# Patient Record
Sex: Female | Born: 1935 | Race: White | Hispanic: No | Smoking: Never smoker
Health system: Southern US, Community
[De-identification: ages and names within clinical notes are randomized; demographics above are authoritative.]

## PROBLEM LIST (undated history)

## (undated) DIAGNOSIS — I10 Essential (primary) hypertension: Secondary | ICD-10-CM

## (undated) DIAGNOSIS — F329 Major depressive disorder, single episode, unspecified: Secondary | ICD-10-CM

## (undated) DIAGNOSIS — E785 Hyperlipidemia, unspecified: Secondary | ICD-10-CM

## (undated) DIAGNOSIS — Z8719 Personal history of other diseases of the digestive system: Secondary | ICD-10-CM

## (undated) DIAGNOSIS — J45909 Unspecified asthma, uncomplicated: Secondary | ICD-10-CM

## (undated) DIAGNOSIS — Z923 Personal history of irradiation: Secondary | ICD-10-CM

## (undated) DIAGNOSIS — K08109 Complete loss of teeth, unspecified cause, unspecified class: Secondary | ICD-10-CM

## (undated) DIAGNOSIS — Z972 Presence of dental prosthetic device (complete) (partial): Secondary | ICD-10-CM

## (undated) DIAGNOSIS — K219 Gastro-esophageal reflux disease without esophagitis: Secondary | ICD-10-CM

## (undated) DIAGNOSIS — Z87442 Personal history of urinary calculi: Secondary | ICD-10-CM

## (undated) DIAGNOSIS — R3915 Urgency of urination: Secondary | ICD-10-CM

## (undated) DIAGNOSIS — R1310 Dysphagia, unspecified: Secondary | ICD-10-CM

## (undated) DIAGNOSIS — J40 Bronchitis, not specified as acute or chronic: Secondary | ICD-10-CM

## (undated) DIAGNOSIS — I350 Nonrheumatic aortic (valve) stenosis: Secondary | ICD-10-CM

## (undated) DIAGNOSIS — G4700 Insomnia, unspecified: Secondary | ICD-10-CM

## (undated) DIAGNOSIS — C50919 Malignant neoplasm of unspecified site of unspecified female breast: Secondary | ICD-10-CM

## (undated) DIAGNOSIS — F32A Depression, unspecified: Secondary | ICD-10-CM

## (undated) DIAGNOSIS — M1990 Unspecified osteoarthritis, unspecified site: Secondary | ICD-10-CM

## (undated) DIAGNOSIS — K8020 Calculus of gallbladder without cholecystitis without obstruction: Secondary | ICD-10-CM

## (undated) DIAGNOSIS — D649 Anemia, unspecified: Secondary | ICD-10-CM

## (undated) HISTORY — DX: Gastro-esophageal reflux disease without esophagitis: K21.9

## (undated) HISTORY — DX: Personal history of irradiation: Z92.3

## (undated) HISTORY — PX: DILATION AND CURETTAGE OF UTERUS: SHX78

## (undated) HISTORY — PX: TUBAL LIGATION: SHX77

## (undated) HISTORY — DX: Malignant neoplasm of unspecified site of unspecified female breast: C50.919

## (undated) HISTORY — PX: OTHER SURGICAL HISTORY: SHX169

## (undated) HISTORY — DX: Unspecified osteoarthritis, unspecified site: M19.90

## (undated) HISTORY — PX: APPENDECTOMY: SHX54

## (undated) HISTORY — DX: Hyperlipidemia, unspecified: E78.5

## (undated) HISTORY — PX: ESOPHAGOGASTRODUODENOSCOPY: SHX1529

## (undated) HISTORY — DX: Essential (primary) hypertension: I10

## (undated) HISTORY — PX: EXPLORATORY LAPAROTOMY: SUR591

---

## 1996-07-06 HISTORY — PX: BREAST BIOPSY: SHX20

## 1999-06-20 ENCOUNTER — Other Ambulatory Visit: Admission: RE | Admit: 1999-06-20 | Discharge: 1999-06-20 | Payer: Self-pay | Admitting: *Deleted

## 2006-09-29 ENCOUNTER — Emergency Department (HOSPITAL_COMMUNITY): Admission: EM | Admit: 2006-09-29 | Discharge: 2006-09-29 | Payer: Self-pay | Admitting: Emergency Medicine

## 2009-07-06 HISTORY — PX: TOTAL SHOULDER REPLACEMENT: SUR1217

## 2009-09-26 ENCOUNTER — Emergency Department (HOSPITAL_COMMUNITY)
Admission: EM | Admit: 2009-09-26 | Discharge: 2009-09-26 | Payer: Self-pay | Source: Home / Self Care | Admitting: Emergency Medicine

## 2009-09-30 ENCOUNTER — Ambulatory Visit: Payer: Self-pay | Admitting: Interventional Radiology

## 2009-09-30 ENCOUNTER — Emergency Department (HOSPITAL_BASED_OUTPATIENT_CLINIC_OR_DEPARTMENT_OTHER): Admission: EM | Admit: 2009-09-30 | Discharge: 2009-09-30 | Payer: Self-pay | Admitting: Emergency Medicine

## 2009-10-02 ENCOUNTER — Ambulatory Visit: Payer: Self-pay | Admitting: Diagnostic Radiology

## 2009-10-02 ENCOUNTER — Ambulatory Visit (HOSPITAL_BASED_OUTPATIENT_CLINIC_OR_DEPARTMENT_OTHER): Admission: RE | Admit: 2009-10-02 | Discharge: 2009-10-02 | Payer: Self-pay | Admitting: Orthopedic Surgery

## 2009-10-05 ENCOUNTER — Emergency Department (HOSPITAL_BASED_OUTPATIENT_CLINIC_OR_DEPARTMENT_OTHER): Admission: EM | Admit: 2009-10-05 | Discharge: 2009-10-05 | Payer: Self-pay | Admitting: Emergency Medicine

## 2009-10-05 ENCOUNTER — Ambulatory Visit: Payer: Self-pay | Admitting: Radiology

## 2009-10-18 ENCOUNTER — Ambulatory Visit (HOSPITAL_COMMUNITY): Admission: RE | Admit: 2009-10-18 | Discharge: 2009-10-19 | Payer: Self-pay | Admitting: Orthopedic Surgery

## 2010-07-27 ENCOUNTER — Encounter: Payer: Self-pay | Admitting: Orthopedic Surgery

## 2010-09-23 LAB — CBC
HCT: 29.5 % — ABNORMAL LOW (ref 36.0–46.0)
Hemoglobin: 10.3 g/dL — ABNORMAL LOW (ref 12.0–15.0)
Platelets: 208 10*3/uL (ref 150–400)
RBC: 3.38 MIL/uL — ABNORMAL LOW (ref 3.87–5.11)
WBC: 12.2 10*3/uL — ABNORMAL HIGH (ref 4.0–10.5)

## 2010-09-23 LAB — BASIC METABOLIC PANEL
BUN: 14 mg/dL (ref 6–23)
CO2: 23 mEq/L (ref 19–32)
Chloride: 101 mEq/L (ref 96–112)
GFR calc non Af Amer: >60 mL/min (ref >60)
Glucose, Bld: 128 mg/dL — ABNORMAL HIGH (ref 70–99)
Sodium: 134 mEq/L — ABNORMAL LOW (ref 135–145)

## 2010-09-23 LAB — CROSSMATCH

## 2010-09-24 LAB — DIFFERENTIAL
Eosinophils Absolute: 0.2 10*3/uL (ref 0.0–0.7)
Lymphs Abs: 2.7 10*3/uL (ref 0.7–4.0)
Monocytes Absolute: 0.7 10*3/uL (ref 0.1–1.0)
Monocytes Relative: 6 % (ref 3–12)
Neutro Abs: 7.6 10*3/uL (ref 1.7–7.7)
Neutrophils Relative %: 67 % (ref 43–77)

## 2010-09-24 LAB — URINALYSIS, ROUTINE W REFLEX MICROSCOPIC
Ketones, ur: NEGATIVE mg/dL
Nitrite: NEGATIVE
Protein, ur: NEGATIVE mg/dL
pH: 7 (ref 5.0–8.0)

## 2010-09-24 LAB — CBC
HCT: 39.8 % (ref 36.0–46.0)
RBC: 4.55 MIL/uL (ref 3.87–5.11)
RDW: 14.1 % (ref 11.5–15.5)

## 2010-09-24 LAB — COMPREHENSIVE METABOLIC PANEL
AST: 41 U/L — ABNORMAL HIGH (ref 0–37)
CO2: 26 mEq/L (ref 19–32)
Creatinine, Ser: 0.62 mg/dL (ref 0.4–1.2)
GFR calc Af Amer: >60 mL/min (ref >60)
GFR calc non Af Amer: >60 mL/min (ref >60)
Glucose, Bld: 122 mg/dL — ABNORMAL HIGH (ref 70–99)
Total Bilirubin: 0.6 mg/dL (ref 0.3–1.2)
Total Protein: 7.3 g/dL (ref 6.0–8.3)

## 2010-09-24 LAB — CROSSMATCH: ABO/RH(D): O POS

## 2010-09-24 LAB — URINE CULTURE

## 2010-09-24 LAB — PROTIME-INR: INR: 0.97 (ref 0.00–1.49)

## 2011-03-25 ENCOUNTER — Ambulatory Visit
Admission: RE | Admit: 2011-03-25 | Discharge: 2011-03-25 | Disposition: A | Discharge status: Home / Self Care | Payer: Medicare Other | Source: Ambulatory Visit | Attending: Family Medicine | Admitting: Family Medicine

## 2011-03-25 ENCOUNTER — Other Ambulatory Visit: Payer: Self-pay | Admitting: Family Medicine

## 2011-03-25 MED ORDER — IOHEXOL 300 MG/ML  SOLN
100.0000 mL | Freq: Once | INTRAMUSCULAR | Status: AC | PRN
  Administered 2011-03-25: 100 mL via INTRAVENOUS

## 2011-03-26 ENCOUNTER — Telehealth: Payer: Self-pay | Admitting: Gastroenterology

## 2011-03-26 ENCOUNTER — Other Ambulatory Visit: Payer: Self-pay | Admitting: Oncology

## 2011-03-26 ENCOUNTER — Encounter (HOSPITAL_BASED_OUTPATIENT_CLINIC_OR_DEPARTMENT_OTHER): Payer: Medicare Other | Admitting: Oncology

## 2011-03-26 DIAGNOSIS — D649 Anemia, unspecified: Secondary | ICD-10-CM

## 2011-03-26 DIAGNOSIS — C8590 Non-Hodgkin lymphoma, unspecified, unspecified site: Secondary | ICD-10-CM

## 2011-03-26 DIAGNOSIS — C8583 Other specified types of non-Hodgkin lymphoma, intra-abdominal lymph nodes: Secondary | ICD-10-CM

## 2011-03-26 DIAGNOSIS — R1900 Intra-abdominal and pelvic swelling, mass and lump, unspecified site: Secondary | ICD-10-CM

## 2011-03-26 LAB — CBC WITH DIFFERENTIAL/PLATELET
Basophils Absolute: 0.1 10*3/uL (ref 0.0–0.1)
Eosinophils Absolute: 0.1 10*3/uL (ref 0.0–0.5)
MCHC: 33.5 g/dL (ref 31.5–36.0)
MONO#: 0.8 10*3/uL (ref 0.1–0.9)
NEUT#: 8.4 10*3/uL — ABNORMAL HIGH (ref 1.5–6.5)
NEUT%: 75.4 % (ref 38.4–76.8)
RBC: 3.94 10*6/uL (ref 3.70–5.45)
WBC: 11.2 10*3/uL — ABNORMAL HIGH (ref 3.9–10.3)
lymph#: 1.8 10*3/uL (ref 0.9–3.3)

## 2011-03-26 NOTE — Telephone Encounter (Signed)
PT HAS BEEN INSTRUCTED AND MEDS REVIEWED.  INSTRUCTIONS ALSO MAILED TO THE HER HOME

## 2011-03-26 NOTE — Telephone Encounter (Signed)
Dr. Dwain Sarna was contacted by Dr. Drue Second about this patient.  She has a large retroperitoneal mass that needs biopsy.  He got in touch with me to consider EUS (would be less morbid) to get tissue.  Patty, Can you get in touch with Dr. Feliz Beam office about sheduling her for upper EUS, radial +/- linear, next Tuesday afternoon (9/25 1:30 case?) at [facility].  Need H and P and medlist.  Let me know.  Dr. Dwain Sarna was going to talk with Dr. Welton Flakes to tell her he spoke with me and that we would be calling.  Thanks

## 2011-03-27 ENCOUNTER — Encounter: Payer: Medicare Other | Admitting: Oncology

## 2011-03-27 LAB — COMPREHENSIVE METABOLIC PANEL
AST: 25 U/L (ref 0–37)
BUN: 15 mg/dL (ref 6–23)
Calcium: 8.9 mg/dL (ref 8.4–10.5)
Chloride: 100 mEq/L (ref 96–112)
Creatinine, Ser: 0.69 mg/dL (ref 0.50–1.10)
Glucose, Bld: 140 mg/dL — ABNORMAL HIGH (ref 70–99)

## 2011-03-27 LAB — SEDIMENTATION RATE: Sed Rate: 109 mm/hr — ABNORMAL HIGH (ref 0–22)

## 2011-03-27 LAB — LACTATE DEHYDROGENASE: LDH: 208 U/L (ref 94–250)

## 2011-03-27 LAB — URIC ACID: Uric Acid, Serum: 4 mg/dL (ref 2.4–7.0)

## 2011-03-30 ENCOUNTER — Ambulatory Visit (INDEPENDENT_AMBULATORY_CARE_PROVIDER_SITE_OTHER): Payer: Self-pay | Admitting: General Surgery

## 2011-03-31 ENCOUNTER — Other Ambulatory Visit: Payer: Self-pay | Admitting: Gastroenterology

## 2011-03-31 ENCOUNTER — Encounter: Payer: Medicare Other | Admitting: Gastroenterology

## 2011-03-31 ENCOUNTER — Ambulatory Visit (HOSPITAL_COMMUNITY)
Admission: RE | Admit: 2011-03-31 | Discharge: 2011-03-31 | Disposition: A | Discharge status: Home / Self Care | Payer: Medicare Other | Source: Ambulatory Visit | Attending: Gastroenterology | Admitting: Gastroenterology

## 2011-03-31 DIAGNOSIS — R109 Unspecified abdominal pain: Secondary | ICD-10-CM | POA: Insufficient documentation

## 2011-03-31 DIAGNOSIS — R599 Enlarged lymph nodes, unspecified: Secondary | ICD-10-CM | POA: Insufficient documentation

## 2011-03-31 DIAGNOSIS — I2510 Atherosclerotic heart disease of native coronary artery without angina pectoris: Secondary | ICD-10-CM | POA: Insufficient documentation

## 2011-03-31 DIAGNOSIS — R933 Abnormal findings on diagnostic imaging of other parts of digestive tract: Secondary | ICD-10-CM

## 2011-03-31 DIAGNOSIS — R1909 Other intra-abdominal and pelvic swelling, mass and lump: Secondary | ICD-10-CM | POA: Insufficient documentation

## 2011-03-31 DIAGNOSIS — R5381 Other malaise: Secondary | ICD-10-CM | POA: Insufficient documentation

## 2011-03-31 DIAGNOSIS — R634 Abnormal weight loss: Secondary | ICD-10-CM | POA: Insufficient documentation

## 2011-03-31 DIAGNOSIS — R5383 Other fatigue: Secondary | ICD-10-CM | POA: Insufficient documentation

## 2011-04-01 ENCOUNTER — Encounter (HOSPITAL_COMMUNITY)
Admission: RE | Admit: 2011-04-01 | Discharge: 2011-04-01 | Disposition: A | Discharge status: Home / Self Care | Payer: Medicare Other | Source: Ambulatory Visit | Attending: Oncology | Admitting: Oncology

## 2011-04-01 ENCOUNTER — Ambulatory Visit (HOSPITAL_COMMUNITY)
Admission: RE | Admit: 2011-04-01 | Discharge: 2011-04-01 | Disposition: A | Discharge status: Home / Self Care | Payer: Medicare Other | Source: Ambulatory Visit | Attending: Oncology | Admitting: Oncology

## 2011-04-01 DIAGNOSIS — C8590 Non-Hodgkin lymphoma, unspecified, unspecified site: Secondary | ICD-10-CM

## 2011-04-01 DIAGNOSIS — C8589 Other specified types of non-Hodgkin lymphoma, extranodal and solid organ sites: Secondary | ICD-10-CM | POA: Insufficient documentation

## 2011-04-01 DIAGNOSIS — J984 Other disorders of lung: Secondary | ICD-10-CM | POA: Insufficient documentation

## 2011-04-01 DIAGNOSIS — M19019 Primary osteoarthritis, unspecified shoulder: Secondary | ICD-10-CM | POA: Insufficient documentation

## 2011-04-01 MED ORDER — IOHEXOL 300 MG/ML  SOLN
100.0000 mL | Freq: Once | INTRAMUSCULAR | Status: AC | PRN
  Administered 2011-04-01: 100 mL via INTRAVENOUS

## 2011-04-01 MED ORDER — FLUDEOXYGLUCOSE F - 18 (FDG) INJECTION
16.5000 | Freq: Once | INTRAVENOUS | Status: AC | PRN
  Administered 2011-04-01: 16.5 via INTRAVENOUS

## 2011-04-02 ENCOUNTER — Encounter (HOSPITAL_BASED_OUTPATIENT_CLINIC_OR_DEPARTMENT_OTHER): Payer: Medicare Other | Admitting: Oncology

## 2011-04-02 DIAGNOSIS — C8583 Other specified types of non-Hodgkin lymphoma, intra-abdominal lymph nodes: Secondary | ICD-10-CM

## 2011-04-03 ENCOUNTER — Telehealth (INDEPENDENT_AMBULATORY_CARE_PROVIDER_SITE_OTHER): Payer: Self-pay

## 2011-04-03 NOTE — Telephone Encounter (Signed)
Called pt to notify her that we want to see her in the office on Monday 04-06-11 @ 1:30./[facility]

## 2011-04-06 ENCOUNTER — Encounter (INDEPENDENT_AMBULATORY_CARE_PROVIDER_SITE_OTHER): Payer: Self-pay | Admitting: General Surgery

## 2011-04-06 ENCOUNTER — Ambulatory Visit (INDEPENDENT_AMBULATORY_CARE_PROVIDER_SITE_OTHER): Payer: Medicare Other | Admitting: General Surgery

## 2011-04-06 VITALS — BP 158/84 | HR 112 | Temp 97.5°F | Resp 16 | Ht <= 58 in | Wt 169.0 lb

## 2011-04-06 DIAGNOSIS — R1900 Intra-abdominal and pelvic swelling, mass and lump, unspecified site: Secondary | ICD-10-CM

## 2011-04-06 NOTE — Progress Notes (Signed)
Chief Complaint  Patient presents with  . Other    retroperitoneal mass    HPI Brandy [name] is a 77 y.o. female.   HPI This is a 77 year old female who presents for about a month of weight loss of about 35 pounds. During this time she has had no appetite and some constipation as well as some significant epigastric pain that is worsened. She has also had no night sweats, no fevers, no right rectal hernia blood in her stools at all. Due to the symptoms and the weight loss she underwent a CT scan of her abdomen which showed almost a 7 cm abnormal soft tissue attenuation with some fat stranding that is all appearing dorsal to the pancreatic head causing ventral displacement of the pancreas and this appears to be encasing her celiac, superior mesenteric arteries as well as some of the portal vein. This appears to be a lymphoma and not a primary pancreatic malignancy after reviewing the CT scan and discussing with the radiologist. A CT scan of her chest shows no real evidence of any disease at all. She also was noted to have a left breast nodule on one of these scans. PET scan showed a hypermetabolic soft tissue lesion at the posterior aspect of the pancreas around the celiac and superior mesenteric arteries that was present. There was no other abnormal activity in the abdomen or pelvis at all. She underwent an attempted biopsy with endoscopic ultrasound by Dr. Christella Hartigan and this was unsuccessful. She now comes in to discuss a surgical biopsy of this area. She is fairly deconditioned at this point and is not really hungry or taking in much orally now. There is nothing that is really relieving her pain at all at this point either.  Past Medical History  Diagnosis Date  . Arthritis   . Asthma   . GERD (gastroesophageal reflux disease)   . Hyperlipidemia   . Hypertension     Past Surgical History  Procedure Date  . Breast biopsy 1998    left  . Total shoulder replacement 2011    left    Family  History  Problem Relation Age of Onset  . Cancer Father     prostate    Social History History  Substance Use Topics  . Smoking status: Never Smoker   . Smokeless tobacco: Not on file  . Alcohol Use: No    No Known Allergies  Current Outpatient Prescriptions  Medication Sig Dispense Refill  . amLODipine (NORVASC) 10 MG tablet daily.      Marland Kitchen DIOVAN HCT 160-12.5 MG per tablet daily.      . simvastatin (ZOCOR) 20 MG tablet daily.      . traMADol (ULTRAM) 50 MG tablet Ad lib.        Review of Systems Review of Systems  Constitutional: Positive for appetite change, fatigue and unexpected weight change.  HENT: Negative.   Gastrointestinal: Positive for abdominal pain.  Neurological: Positive for weakness.  All other systems reviewed and are negative.    Blood pressure 158/84, pulse 112, temperature 97.5 F (36.4 C), temperature source Temporal, resp. rate 16, height 4\' 10"  (1.473 m), weight 169 lb (76.658 kg).  Physical Exam Physical Exam  Constitutional: She appears well-developed and well-nourished.  Eyes: No scleral icterus.  Neck: Neck supple. No thyromegaly present.  Cardiovascular: Normal rate, regular rhythm and normal heart sounds.   Pulmonary/Chest: Effort normal and breath sounds normal. She has no wheezes. She has no rales.  Abdominal: Soft.  Bowel sounds are normal. She exhibits no mass. There is tenderness (epigastric tenderness). There is no rebound and no guarding.  Lymphadenopathy:    She has no cervical adenopathy.    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 That leaves [place] with an intra-abdominal mass associated very closely with her pancreas and her upper normal vasculature without a diagnosis. She  certainly sounds as she has a malignancy and has what this appears to be. I don't think this is a primary pancreatic malignancy in his looks to be a lymphoma. I will plan on checking a CA 19-9 on her. I discussed with her laparoscopic but a possible open biopsy of this area. I discussed with her I think that if this is the only area of it's abnormal she likely will get an open biopsy due its proximity to her major vasculature in her abdomen just for safety. We discussed the risks being but not limited to bleeding, blood transfusion, infection, difficulty healing given her preoperative state, DVT, PE, and injury to other vascular structure or bowel requiring repair. I don't the we have another choice bu  to proceed to the operating room and encourage her to try to increase her nutrition prior to going to the operating room as well.       Brandy [name] 04/06/2011, 3:37 PM

## 2011-04-14 ENCOUNTER — Other Ambulatory Visit (INDEPENDENT_AMBULATORY_CARE_PROVIDER_SITE_OTHER): Payer: Self-pay | Admitting: General Surgery

## 2011-04-14 ENCOUNTER — Encounter (HOSPITAL_COMMUNITY): Payer: Medicare Other

## 2011-04-14 LAB — CBC
Hemoglobin: 10.7 g/dL — ABNORMAL LOW (ref 12.0–15.0)
MCH: 27 pg (ref 26.0–34.0)
MCV: 86.1 fL (ref 78.0–100.0)
RBC: 3.97 MIL/uL (ref 3.87–5.11)

## 2011-04-14 LAB — PROTIME-INR: INR: 1.05 (ref 0.00–1.49)

## 2011-04-14 LAB — CROSSMATCH

## 2011-04-14 LAB — DIFFERENTIAL
Eosinophils Absolute: 0.1 10*3/uL (ref 0.0–0.7)
Eosinophils Relative: 1 % (ref 0–5)
Lymphs Abs: 2 10*3/uL (ref 0.7–4.0)
Monocytes Relative: 10 % (ref 3–12)

## 2011-04-14 LAB — COMPREHENSIVE METABOLIC PANEL
ALT: 26 U/L (ref 0–35)
Albumin: 3.2 g/dL — ABNORMAL LOW (ref 3.5–5.2)
Alkaline Phosphatase: 95 U/L (ref 39–117)
BUN: 16 mg/dL (ref 6–23)
Chloride: 100 mEq/L (ref 96–112)
Glucose, Bld: 125 mg/dL — ABNORMAL HIGH (ref 70–99)
Potassium: 3.8 mEq/L (ref 3.5–5.1)
Sodium: 139 mEq/L (ref 135–145)
Total Bilirubin: 0.2 mg/dL — ABNORMAL LOW (ref 0.3–1.2)

## 2011-04-14 LAB — SURGICAL PCR SCREEN: MRSA, PCR: NEGATIVE

## 2011-04-16 ENCOUNTER — Other Ambulatory Visit (INDEPENDENT_AMBULATORY_CARE_PROVIDER_SITE_OTHER): Payer: Self-pay | Admitting: General Surgery

## 2011-04-16 ENCOUNTER — Inpatient Hospital Stay (HOSPITAL_COMMUNITY)
Admission: RE | Admit: 2011-04-16 | Discharge: 2011-04-21 | DRG: 358 | Disposition: A | Discharge status: Home / Self Care | Payer: Medicare Other | Source: Ambulatory Visit | Attending: General Surgery | Admitting: General Surgery

## 2011-04-16 DIAGNOSIS — I10 Essential (primary) hypertension: Secondary | ICD-10-CM | POA: Diagnosis present

## 2011-04-16 DIAGNOSIS — D200 Benign neoplasm of soft tissue of retroperitoneum: Secondary | ICD-10-CM

## 2011-04-16 DIAGNOSIS — K219 Gastro-esophageal reflux disease without esophagitis: Secondary | ICD-10-CM | POA: Diagnosis present

## 2011-04-16 DIAGNOSIS — Z01812 Encounter for preprocedural laboratory examination: Secondary | ICD-10-CM

## 2011-04-16 DIAGNOSIS — D201 Benign neoplasm of soft tissue of peritoneum: Secondary | ICD-10-CM

## 2011-04-16 DIAGNOSIS — R634 Abnormal weight loss: Secondary | ICD-10-CM | POA: Diagnosis present

## 2011-04-16 DIAGNOSIS — Z01810 Encounter for preprocedural cardiovascular examination: Secondary | ICD-10-CM

## 2011-04-16 DIAGNOSIS — K869 Disease of pancreas, unspecified: Secondary | ICD-10-CM | POA: Diagnosis present

## 2011-04-16 DIAGNOSIS — R1900 Intra-abdominal and pelvic swelling, mass and lump, unspecified site: Principal | ICD-10-CM | POA: Diagnosis present

## 2011-04-16 DIAGNOSIS — E785 Hyperlipidemia, unspecified: Secondary | ICD-10-CM | POA: Diagnosis present

## 2011-04-17 LAB — BASIC METABOLIC PANEL
CO2: 24 mEq/L (ref 19–32)
Calcium: 8.6 mg/dL (ref 8.4–10.5)
Chloride: 102 mEq/L (ref 96–112)
GFR calc Af Amer: 64 mL/min — ABNORMAL LOW (ref >90)
Glucose, Bld: 157 mg/dL — ABNORMAL HIGH (ref 70–99)
Potassium: 5.2 mEq/L — ABNORMAL HIGH (ref 3.5–5.1)
Sodium: 135 mEq/L (ref 135–145)

## 2011-04-17 LAB — CBC
Hemoglobin: 10.4 g/dL — ABNORMAL LOW (ref 12.0–15.0)
MCH: 26.9 pg (ref 26.0–34.0)
Platelets: 346 10*3/uL (ref 150–400)
RBC: 3.87 MIL/uL (ref 3.87–5.11)
WBC: 17.1 10*3/uL — ABNORMAL HIGH (ref 4.0–10.5)

## 2011-04-18 LAB — BASIC METABOLIC PANEL
BUN: 18 mg/dL (ref 6–23)
Calcium: 8.3 mg/dL — ABNORMAL LOW (ref 8.4–10.5)
Creatinine, Ser: 0.73 mg/dL (ref 0.50–1.10)
GFR calc Af Amer: >90 mL/min (ref >90)
GFR calc non Af Amer: 81 mL/min — ABNORMAL LOW (ref >90)

## 2011-04-19 LAB — BASIC METABOLIC PANEL
CO2: 26 mEq/L (ref 19–32)
Calcium: 8.4 mg/dL (ref 8.4–10.5)
Creatinine, Ser: 0.49 mg/dL — ABNORMAL LOW (ref 0.50–1.10)
GFR calc Af Amer: >90 mL/min (ref >90)
GFR calc non Af Amer: >90 mL/min (ref >90)

## 2011-04-19 NOTE — Op Note (Signed)
NAMEMYAN, LOCATELLI NO.:  [id number]  MEDICAL RECORD NO.:  [id number]  LOCATION:  1531                         FACILITY:  [facility]  PHYSICIAN:  Juanetta Gosling, MDDATE OF BIRTH:  [birth year]  DATE OF PROCEDURE:  04/16/2011 DATE OF DISCHARGE:                              OPERATIVE REPORT   PREOPERATIVE DIAGNOSIS:  Retroperitoneal mass.  POSTOPERATIVE DIAGNOSIS:  Retroperitoneal mass.  PROCEDURE: 1. Diagnostic laparoscopy. 2. Exploratory laparotomy with biopsy and multiple FNAs of     retroperitoneal mass in the pancreas.  SURGEON:  Troy Sine. Dwain Sarna, M.D.  ASSISTANT:  Mary Sella. Andrey Campanile, MD.  ANESTHESIA:  General.  SUPERVISOR/ANESTHESIOLOGIST:  Hezzie Bump. Rose, M.D.  SPECIMENS TO PATHOLOGY: 1. Multiple FNAs with cell blocks. 2. Omental nodule. 3. Pancreatic biopsy.  ESTIMATED BLOOD LOSS:  100 mL.  COMPLICATIONS:  None.  DRAINS:  19-French Blake overlying the pancreas.  DISPOSITION:  To recovery room in stable condition.  INDICATIONS:  This is a 77 year old female who had had 35-pound weight loss over the last month with no appetite, some constipation, and some significant epigastric pain.  Due to the symptoms and the weight loss, she underwent a CT scan of her abdomen that showed an almost 7 cm abnormal soft tissue attenuation with fat stranding that appears dorsal to the pancreatic head, causing ventral displacement, and is encasing her celiac, superior mesenteric as well as some of the portal vein. This appeared to be a lymphoma and the radiologist thought, after reviewing the CT scan, this is not a primary pancreatic malignancy.  PET scan shows a hypermetabolic soft tissue lesion at the posterior aspect of the pancreas around the celiac and superior mesenteric arteries. There is no other abnormal activity.  I discussed this with the Intervention Radiology, who did not feel this is amenable to biopsy. She also wanted an endoscopic  ultrasound by Dr. Christella Hartigan after I referred her to him to undergo a biopsy, and this was benign as well and he did feel like he got into the area.  She and I then discussed an open biopsy.  I discussed laparoscopy with a possible open biopsy.  PROCEDURE IN DETAIL:  After informed consent was obtained, the patient was taken to the operating room.  She was administered 1 g of intravenous cefazolin.  Sequential compression devices were placed on the lower extremities prior to induction of the anesthesia.  She was then placed under general endotracheal anesthesia without complication. Nasogastric tube and a Foley catheter were placed.  Her abdomen was then prepped and draped in the standard sterile surgical fashion.  Surgical time-out was then performed.  I accessed, I made a 5 mm incision in the left upper quadrant.  After her stomach was evacuated, I inserted the laparoscope in Optiview technique.  After a thorough exploration of her abdomen, I did not identify any disease that was amenable to a biopsy.  I then removed the laparoscope and made an upper midline incision.  I inserted the Bookwalter retractor.  There was no evidence of any hepatic disease, peritoneal disease, or any other disease in her spleen, large bowel, or small bowel.  What she had was a very large nondiscrete mass  near the head of her pancreas, duodenum, and in her retroperitoneum.  I opened her lesser sac and took the transverse colon down completely viewing the entire pancreas.  The entire pancreas felt hard and nodular.  I also took down some of the lesser curvature, as I felt like this was a mass underneath this as well.  There was not really an area that was amenable to a biopsy completely in either one of these areas at first glance.  We actually attempted to kocherize the duodenum as well.  The mass was very adherent to the retroperitoneum as we began to kocherize the duodenum and it was stuck and there was no  real tissue plane to continue so I had stopped kocherizing the duodenum as I think that this would have caused some significant problems if I was going to continue this area.  During this time, I found an omental nodule that I removed and sent, and this appears to be benign according to the pathologist.  Eventually, what I had decided on doing was that there was not really any other way to get this area, I did took a 15 blade and took a portion of the very hardened pancreas where this felt like it was the mass and excised this.  This, on frozen, appeared to be benign and did not have a malignancy associated with it, we will have to wait for the final results.  I also did remove the omental nodule, which ended up being benign on frozen.  I also at that point, did not really feel like there was another area I could biopsy or put a core biopsy needle in, so what I did was I did several FNAs of the mass and the head of the pancreas.  There were epithelial cells on one of these FNAs and there was some tissue to do a cell block afterwards.  I discussed this with a couple of my partners as well and had another one of my partners outside, Dr. Andrey Campanile, scrub in to see if he would do anything else further at this point and he was agreeable that it would not continue at this point either.  I think even with image-directed biopsies, I would not be comfortable putting a needle any further in any of these areas or certainly trying to mobilize these areas, I think that it would cause more damage and I would put her risk of either injuring a major abdominal blood vessel or her common duct or putting a hole in her duodenum.  I elected at this point, as I had some tissue for biopsies as well as the FNAs to stop.  I then placed a 19-French Blake drain overlying of her pancreas where I had done the biopsies.  I then irrigated copiously.  I then closed her with  #1 PDS and interrupted #1 Novafil.  I then  stapled her skin.  I placed a silver dressing overlying this.  She tolerated this well, was extubated and transferred to the recovery room in stable condition.     Juanetta Gosling, MD     MCW/MEDQ  D:  04/16/2011  T:  04/17/2011  Job:  [id number]  cc:   Drue Second, M.D.  Rachael Fee, MD [address] [place], [place] [address]  Sigmund Hazel, M.D. Fax: [phone]  Electronically Signed by Emelia Loron MD on 04/19/2011 07:11:46 PM

## 2011-04-20 ENCOUNTER — Ambulatory Visit (INDEPENDENT_AMBULATORY_CARE_PROVIDER_SITE_OTHER): Payer: Self-pay | Admitting: General Surgery

## 2011-04-20 LAB — BASIC METABOLIC PANEL
Calcium: 8.6 mg/dL (ref 8.4–10.5)
Glucose, Bld: 124 mg/dL — ABNORMAL HIGH (ref 70–99)
Sodium: 138 mEq/L (ref 135–145)

## 2011-04-21 LAB — BASIC METABOLIC PANEL
BUN: 6 mg/dL (ref 6–23)
Potassium: 3.4 mEq/L — ABNORMAL LOW (ref 3.5–5.1)

## 2011-04-23 ENCOUNTER — Telehealth: Payer: Self-pay

## 2011-04-23 DIAGNOSIS — K8689 Other specified diseases of pancreas: Secondary | ICD-10-CM

## 2011-04-23 NOTE — Telephone Encounter (Signed)
Message copied by Donata Duff on Thu Apr 23, 2011 10:21 AM ------      Message from: Rob Bunting P      Created: Thu Apr 23, 2011 10:01 AM       Casimira Sutphin,       She needs repeat upper EUS, linear only, ++propofol, for pancreatic mass, next available EUS time at [facility]            thanks                  ----- Message -----         From: Emelia Loron, MD         Sent: 04/23/2011   9:41 AM           To: Rob Bunting, MD            I talked to her about it.  Maybe try in about two weeks.  If you would do that would be great. I really appreciate it.  I will talk to you sometime before this to let you know what I found.      Matt      ----- Message -----         From: Rob Bunting, MD         Sent: 04/23/2011   7:38 AM           To: Emelia Loron, MD            Yes, I'll give it another try.  Should I go ahead and get in touch with her to book it?            dj            ----- Message -----         From: Emelia Loron, MD         Sent: 04/23/2011   7:27 AM           To: Rob Bunting, MD            Jesusita Oka      I tried to biopsy Lamoyne Kirchgessner open and all my biopsies were nondiagnostic.  This thing clinically is a pancreatic cancer even though 19-9 is normal.  We still need tissue.  Would you be willing in a couple of weeks to try again?      Matt

## 2011-04-24 NOTE — Telephone Encounter (Signed)
Need to call pt for a Dec appt no Nov appt available

## 2011-04-29 ENCOUNTER — Telehealth: Payer: Self-pay

## 2011-04-29 ENCOUNTER — Other Ambulatory Visit: Payer: Self-pay | Admitting: Gastroenterology

## 2011-04-29 NOTE — Telephone Encounter (Addendum)
Pt scheduled for EUS needs instructions and meds reviewed. Left message on machine to call back

## 2011-04-30 ENCOUNTER — Encounter (INDEPENDENT_AMBULATORY_CARE_PROVIDER_SITE_OTHER): Payer: Self-pay | Admitting: General Surgery

## 2011-04-30 ENCOUNTER — Ambulatory Visit (INDEPENDENT_AMBULATORY_CARE_PROVIDER_SITE_OTHER): Payer: Medicare Other | Admitting: General Surgery

## 2011-04-30 ENCOUNTER — Telehealth (INDEPENDENT_AMBULATORY_CARE_PROVIDER_SITE_OTHER): Payer: Self-pay

## 2011-04-30 VITALS — BP 138/64 | HR 96 | Temp 97.6°F | Resp 18 | Ht 60.0 in | Wt 164.4 lb

## 2011-04-30 DIAGNOSIS — Z09 Encounter for follow-up examination after completed treatment for conditions other than malignant neoplasm: Secondary | ICD-10-CM

## 2011-04-30 NOTE — Telephone Encounter (Signed)
The daughter wants to talk to you about the pt that was seen today and going to see Dr Christella Hartigan. The daughter said she could call you and ask you any questions about the pt and you would call her back. Her work [phone] or [phone]/ [facility]

## 2011-04-30 NOTE — Progress Notes (Signed)
Subjective:     Patient ID: Brandy [name], female   DOB: 1932-12-20, 77 y.o.   MRN: [id number]  HPI Is a 77 year old female who I did a biopsy of an intra-abdominal mass opened not long ago. No disease was diagnostic. This was very difficult to do and appeared to clinically possibly be a pancreatic cancer. She comes in today doing fairly well after operation but no real significant complaints.  Review of Systems     Objective:   Physical Exam Healing incision without infection, minimal serous drainage around umbilicus    Assessment:     S/p e lap for mass    Plan:        I took her staples out today and applied Steri-Strips. I will see her back in 2 weeks. I will also discuss with Dr. Christella Hartigan in proceeding with another endoscopic ultrasound with biopsy. She was discussed at gastrointestinal multidisciplinary conference this was agreed upon by one as well.

## 2011-04-30 NOTE — Telephone Encounter (Signed)
Pt aware meds reviwed and instructed instructions also mailed to the home

## 2011-05-03 NOTE — Discharge Summary (Signed)
Brandy [name], MURFIN NO.:  [id number]  MEDICAL RECORD NO.:  [id number]  LOCATION:  1531                         FACILITY:  [facility]  PHYSICIAN:  Juanetta Gosling, MDDATE OF BIRTH:  1933/04/17  DATE OF ADMISSION:  04/16/2011 DATE OF DISCHARGE:  04/21/2011                              DISCHARGE SUMMARY   ADMISSION DIAGNOSES: 1. Retroperitoneal mass. 2. Arthritis. 3. History of asthma. 4. Gastroesophageal reflux disease. 5. Hyperlipidemia. 6. Hypertension.  DISCHARGE DIAGNOSES: 1. Retroperitoneal mass. 2. Arthritis. 3. History of asthma. 4. Gastroesophageal reflux disease. 5. Hyperlipidemia. 6. Hypertension. 7. Ileus.  PROCEDURES PERFORMED:  On April 16, 2011 with a diagnostic laparoscopy with an exploratory laparotomy with biopsy, multiple FNAs of retroperitoneal mass that appeared to be emanating from the pancreas.  HISTORY AND CONSULTS:  None.  HISTORY AND HOSPITAL COURSE:  Brandy [name] is a 77 year old female who had about a month weight loss about 35 pounds.  She has had no appetite and had some epigastric pain.  She has had a CT scan that showed about 5 to 7 cm abnormal soft tissue attenuation with some fat stranding, all appearing dorsal to pancreatic head, causing a ventral displacement or pancreas that appeared to be a case her celiac, SMA, portal vein.  That appeared to be a lymphoma and I did not think was a primary pancreatic malignancy.  Her CA 99 is normal.  CT scan of the chest is negative. PET scan shows only hypermetabolic soft tissue lesion at the posterior aspect of the pancreas.  She underwent attempted biopsy with endoscopic ultrasound by Dr. Christella Hartigan which was unsuccessful, so we discussed an open biopsy as well as the risks and benefits associated with that procedure. She underwent a procedure which was exceedingly difficult, and need to refer to my operative note for the details on that.  This did appear to emanate from the  pancreas.  I took a numerous biopsies of this area. She after the operation was maintained on the floor, I discontinued the NG tube.  The following day, I left that in due to the fact that it mobilized her stomach fairly significantly.  She was maintained on Lovenox.  Her Foley was removed.  She began passing gas on postoperative day #2 and her diet was advanced.  Her oxygen was weaned off and by postoperative day #5, she had oral pain meds which she was doing well with, she was tolerating her diet, and she was discharged to home.  Her pathology ended up as being all benign or benign pancreatic tissue with a mixed inflammatory infiltrate with no evidence of a cancer that was present.  MEDICATIONS:  Upon discharge were: 1. Colace 100 mg b.i.d. 2. Percocet as needed. 3. Amlodipine 10 mg daily. 4. Prilosec 20 mg daily. 5. Simvastatin 20 mg daily. 6. Tramadol as needed. 7. Valsartan/hydrochlorothiazide daily.  PERTINENT LABORATORY EVALUATION:  Hematocrit of 33.5, her creatinine 0.97 and less than 0.47 upon discharge.  PERTINENT RADIOLOGIC INFORMATION:  None.  FOLLOWUP:  Dr. Dwain Sarna, [facility] in 2 to 3 weeks of discharge was per her [facility] instructions sheets that she was discharged to home.     Juanetta Gosling, MD  MCW/MEDQ  D:  05/02/2011  T:  05/02/2011  Job:  [id number]  Electronically Signed by Emelia Loron MD on 05/03/2011 11:91:47 PM

## 2011-05-07 NOTE — Telephone Encounter (Signed)
I haven't called her yet.

## 2011-05-18 ENCOUNTER — Ambulatory Visit (INDEPENDENT_AMBULATORY_CARE_PROVIDER_SITE_OTHER): Payer: Medicare Other | Admitting: General Surgery

## 2011-05-18 ENCOUNTER — Encounter (INDEPENDENT_AMBULATORY_CARE_PROVIDER_SITE_OTHER): Payer: Self-pay | Admitting: General Surgery

## 2011-05-18 VITALS — BP 132/70 | HR 76 | Temp 97.4°F | Resp 14 | Ht 59.75 in | Wt 158.4 lb

## 2011-05-18 DIAGNOSIS — Z09 Encounter for follow-up examination after completed treatment for conditions other than malignant neoplasm: Secondary | ICD-10-CM

## 2011-05-18 NOTE — Progress Notes (Signed)
Subjective:     Patient ID: Brandy [name], female   DOB: 08/03/1932, 78 y.o.   MRN: [id number]  HPI This is a 78-year-old female who went to the operating room on October 11 for a laparotomy for a biopsy for retroperitoneal mass. All of these biopsies and to be nondiagnostic. This clinically I thought was a cancer at the time of the surgery. Her CA 19-9 has been normal. She has recovered fairly well. She has a small open area in her wound which is healing and almost nearly gone now. She had a significant amount of heartburn preoperatively which is no longer present. She still has some abdominal pain from her surgery but is otherwise different from preoperatively. She reports she is eating okay and having several frequent smaller meals per day now. She is due to see Dr. Jacobs in 2 weeks.  Review of Systems     Objective:   Physical Exam Healing laparotomy incision without infection, 1mm open area I packed with iodoform today    Assessment:     S/p elap Retroperitoneal mass    Plan:     All of my prior biopsies have been nondiagnostic. I stopped the operation due to my concern for causing an injury in this region. I don't think there is anything else to do surgically to its proximity to the vessels and my inability to continue with Kocher maneuver. We still don't really have a diagnosis on this area. The plan is to redo an endoscopic ultrasound with the biopsy and see if this will gain access for a biopsy. If this is negative then we are going need to discuss further options which I think the best would be to proceed with a repeat CT scan in in follow up from there.      

## 2011-05-19 ENCOUNTER — Telehealth: Payer: Self-pay

## 2011-05-19 NOTE — Telephone Encounter (Signed)
Message copied by Donata Duff on Tue May 19, 2011  9:20 AM ------      Message from: Rachael Fee      Created: Tue May 19, 2011  7:10 AM       Emanii Bugbee,      Can you put her in for linear EUS, , for retroperitoneal mass.  Looks like I have time on Nov 29th at [facility] in AM (she does not need propofol).            Matt,      I think we can do this on 29th, we'll get in touch with her and I'll let you know what I find.                  ----- Message -----         From: Emelia Loron, MD         Sent: 05/18/2011   2:22 PM           To: Rob Bunting, MD            Jesusita Oka,      I saw Mrs. Chalk today.  She is fine from my standpoint to go ahead with EUS.  I think she is supposed to see you in December.  They are nervous (appropriately) and wanted to see if you could do sooner at all.      Thanks,      [employer]

## 2011-05-19 NOTE — Telephone Encounter (Signed)
Pt appt moved to 06/04/11 NO MAC she is aware and re instructed

## 2011-06-03 ENCOUNTER — Telehealth: Payer: Self-pay | Admitting: Gastroenterology

## 2011-06-03 NOTE — Telephone Encounter (Signed)
On call note @ 1900. Pt calling to check on time of procedure with Dr. Christella Hartigan and time to be at [facility] tomorrow. She was told to be there at 0730 but she thinks that is not correct. She says she was told that someone would call her tonight about the time. I cannot locate Dr. Christella Hartigan schedule for tomorrow so I advised her to call [facility] at [phone] to see if someone can help her find the time of her procedure and that she should arrive 1 hours before the start time. If she cannot get any additional information please show up at 0730 as she was told.

## 2011-06-04 ENCOUNTER — Encounter (HOSPITAL_COMMUNITY): Payer: Self-pay | Admitting: Gastroenterology

## 2011-06-04 ENCOUNTER — Encounter (HOSPITAL_COMMUNITY)
Admission: RE | Disposition: A | Discharge status: Home / Self Care | Payer: Self-pay | Source: Ambulatory Visit | Attending: Gastroenterology

## 2011-06-04 ENCOUNTER — Ambulatory Visit (HOSPITAL_COMMUNITY)
Admission: RE | Admit: 2011-06-04 | Discharge: 2011-06-04 | Disposition: A | Discharge status: Home / Self Care | Payer: Medicare Other | Source: Ambulatory Visit | Attending: Gastroenterology | Admitting: Gastroenterology

## 2011-06-04 ENCOUNTER — Other Ambulatory Visit: Payer: Self-pay | Admitting: Gastroenterology

## 2011-06-04 DIAGNOSIS — K2990 Gastroduodenitis, unspecified, without bleeding: Secondary | ICD-10-CM | POA: Insufficient documentation

## 2011-06-04 DIAGNOSIS — R933 Abnormal findings on diagnostic imaging of other parts of digestive tract: Secondary | ICD-10-CM

## 2011-06-04 DIAGNOSIS — K869 Disease of pancreas, unspecified: Secondary | ICD-10-CM | POA: Insufficient documentation

## 2011-06-04 DIAGNOSIS — K2970 Gastritis, unspecified, without bleeding: Secondary | ICD-10-CM | POA: Insufficient documentation

## 2011-06-04 HISTORY — PX: EUS: SHX5427

## 2011-06-04 SURGERY — UPPER ENDOSCOPIC ULTRASOUND (EUS) LINEAR
Anesthesia: Moderate Sedation

## 2011-06-04 MED ORDER — FENTANYL CITRATE 0.05 MG/ML IJ SOLN
INTRAMUSCULAR | Status: AC
  Filled 2011-06-04: qty 2

## 2011-06-04 MED ORDER — MIDAZOLAM HCL 10 MG/2ML IJ SOLN
INTRAMUSCULAR | Status: AC
  Filled 2011-06-04: qty 2

## 2011-06-04 MED ORDER — MIDAZOLAM HCL 10 MG/2ML IJ SOLN
INTRAMUSCULAR | Status: DC | PRN
  Administered 2011-06-04 (×2): 2 mg via INTRAVENOUS
  Administered 2011-06-04: 1 mg via INTRAVENOUS
  Administered 2011-06-04: 2 mg via INTRAVENOUS

## 2011-06-04 MED ORDER — DIPHENHYDRAMINE HCL 50 MG/ML IJ SOLN
INTRAMUSCULAR | Status: DC | PRN
  Administered 2011-06-04: 25 mg via INTRAVENOUS

## 2011-06-04 MED ORDER — SODIUM CHLORIDE 0.9 % IV SOLN
INTRAVENOUS | Status: DC
  Administered 2011-06-04: 500 mL via INTRAVENOUS

## 2011-06-04 MED ORDER — FENTANYL CITRATE 0.05 MG/ML IJ SOLN
INTRAMUSCULAR | Status: DC | PRN
  Administered 2011-06-04 (×3): 25 ug via INTRAVENOUS

## 2011-06-04 MED ORDER — DIPHENHYDRAMINE HCL 50 MG/ML IJ SOLN
INTRAMUSCULAR | Status: AC
  Filled 2011-06-04: qty 1

## 2011-06-04 NOTE — H&P (View-Only) (Signed)
Subjective:     Patient ID: ALDEA AVIS, female   DOB: 06-10-1933, 77 y.o.   MRN: [id number]  HPI This is a 77 year old female who went to the operating room on October 11 for a laparotomy for a biopsy for retroperitoneal mass. All of these biopsies and to be nondiagnostic. This clinically I thought was a cancer at the time of the surgery. Her CA 19-9 has been normal. She has recovered fairly well. She has a small open area in her wound which is healing and almost nearly gone now. She had a significant amount of heartburn preoperatively which is no longer present. She still has some abdominal pain from her surgery but is otherwise different from preoperatively. She reports she is eating okay and having several frequent smaller meals per day now. She is due to see Dr. Christella Hartigan in 2 weeks.  Review of Systems     Objective:   Physical Exam Healing laparotomy incision without infection, 1mm open area I packed with iodoform today    Assessment:     S/p elap Retroperitoneal mass    Plan:     All of my prior biopsies have been nondiagnostic. I stopped the operation due to my concern for causing an injury in this region. I don't think there is anything else to do surgically to its proximity to the vessels and my inability to continue with Kocher maneuver. We still don't really have a diagnosis on this area. The plan is to redo an endoscopic ultrasound with the biopsy and see if this will gain access for a biopsy. If this is negative then we are going need to discuss further options which I think the best would be to proceed with a repeat CT scan in in follow up from there.

## 2011-06-04 NOTE — Interval H&P Note (Signed)
History and Physical Interval Note:  06/04/2011 8:08 AM  Brandy [name]  has presented today for surgery, with the diagnosis of pancreatic mass  The various methods of treatment have been discussed with the patient and family. After consideration of risks, benefits and other options for treatment, the patient has consented to  Procedure(s): UPPER ENDOSCOPIC ULTRASOUND (EUS) LINEAR as a surgical intervention .  The patients' history has been reviewed, patient examined, no change in status, stable for surgery.  I have reviewed the patients' chart and labs.  Questions were answered to the patient's satisfaction.     Rob Bunting

## 2011-06-05 ENCOUNTER — Encounter (HOSPITAL_COMMUNITY): Payer: Self-pay

## 2011-06-10 ENCOUNTER — Encounter (HOSPITAL_COMMUNITY): Payer: Self-pay | Admitting: Gastroenterology

## 2011-06-11 ENCOUNTER — Encounter: Payer: Self-pay | Admitting: *Deleted

## 2011-06-11 ENCOUNTER — Telehealth: Payer: Self-pay | Admitting: *Deleted

## 2011-06-11 NOTE — Telephone Encounter (Signed)
Pt's daughter Eather Colas called concerning recent Endoscopy results and CA 19.9 level. Informed appt to be made for pt and family to speak with Dr. Welton Flakes. Delores agreed that would be fine. Tx Onc Sched for appt.

## 2011-06-12 ENCOUNTER — Telehealth: Payer: Self-pay | Admitting: Oncology

## 2011-06-12 ENCOUNTER — Other Ambulatory Visit: Payer: Self-pay | Admitting: Oncology

## 2011-06-12 DIAGNOSIS — C259 Malignant neoplasm of pancreas, unspecified: Secondary | ICD-10-CM

## 2011-06-12 NOTE — Telephone Encounter (Signed)
called pt and informed her of appts for jan2013

## 2011-06-15 ENCOUNTER — Telehealth: Payer: Self-pay | Admitting: Oncology

## 2011-06-15 NOTE — Telephone Encounter (Signed)
called pt and informed her of appt for 12/14

## 2011-06-18 ENCOUNTER — Ambulatory Visit (INDEPENDENT_AMBULATORY_CARE_PROVIDER_SITE_OTHER): Payer: Medicare Other | Admitting: General Surgery

## 2011-06-18 ENCOUNTER — Encounter (INDEPENDENT_AMBULATORY_CARE_PROVIDER_SITE_OTHER): Payer: Self-pay | Admitting: General Surgery

## 2011-06-18 VITALS — BP 128/70 | HR 88 | Temp 97.5°F | Resp 16 | Ht 60.0 in | Wt 157.5 lb

## 2011-06-18 DIAGNOSIS — Z09 Encounter for follow-up examination after completed treatment for conditions other than malignant neoplasm: Secondary | ICD-10-CM

## 2011-06-18 NOTE — Progress Notes (Signed)
Subjective:     Patient ID: Brandy [name], female   DOB: 05-13-1933, 77 y.o.   MRN: [id number]  HPI This is a 77 year old female who came to see me with some weight loss as well as an intra-abdominal mass. This CT scan is below and shows a larger ill-defined soft tissue attenuation at the dorsal surface of the pancreas. She has a normal CA 19-9. She underwent attempted endoscopic ultrasound biopsy that did not give a definitive diagnosis. We then discussed this with interventional radiology but they are also unable to biopsy this. I then took her to the operating room and a diagnostic laparoscopy right did not find anything and then did a laparotomy. He see my operative report for full details but I never got a diagnostic biopsy but on the final pathology. She is recovered from the operation for the most part except for a small open wound right now that is healing well. We attempted a repeat endoscopic ultrasound that another biopsy showed only glandular cells. She comes in today to discuss what her next option is. She has she feels better than she did before my operation I don't really have a real explanation for that. She eats a decent amount during the day and has not lost any weight since the last visit at all.  Review of Systems CT ABDOMEN AND PELVIS WITH CONTRAST  Technique: Multidetector CT imaging of the abdomen and pelvis was  performed following the standard protocol during bolus  administration of intravenous contrast.  Contrast: OMNIPAQUE IOHEXOL 300 MG/ML IV SOLN  Comparison: None.  Findings: No pericardial or pleural effusion.  Lung bases are clear.  The spleen is negative.  The adrenal glands are both normal.  No focal liver abnormality.  The gallbladder appears normal. The common bile duct appears  increased in caliber measuring 0.8 cm, image number 24.  There is a large area of abnormal soft tissue attention and fat  stranding dorsal to the pancreatic head and body. This  causes  ventral displacement of the pancreas and encases the celiac and  superior mesenteric arteries as well as the portal vein. This  measures approximately 4.4 x 4.0 x 6.5 cm.  There is no dilatation of the pancreatic duct.  Porta hepatic lymph node is enlarged measuring 1.3 cm, image 19.  Both adrenal glands are normal.  Bilateral parapelvic cysts noted. There are small bilateral  cortical cysts as well.  There is no pelvic or inguinal adenopathy.  Urinary bladder appears normal.  The uterus and the adnexal structures appear normal.  No free fluid or fluid collections identified.  Review of the visualized osseous structures is significant for  multilevel degenerative disc disease.  IMPRESSION:  1. There is a large area of ill-defined soft tissue attenuation  and fat stranding dorsal surface of the pancreas. This results this  results in ventral displacement of the pancreas and appears to  encase the superior mesenteric artery, celiac artery and portal  vein. Favor lymphoma with primary pancreatic carcinoma less  favored. Mesenteric fibrosis is also a potential, but less likely  differential consideration. Recommend PET/CT for further  evaluation.     Objective:   Physical Exam    abdomen soft, nontender, 1x1 cm open wound superficial without infection Assessment:     Intra-abdominal mass    Plan:     Nothing has been diagnostic as to what this mass is. I still cannot tell her that this is not a tumor. I discussed all of  her options including referral for second opinion. I have reviewed this with several other partners in Dr. Christella Hartigan who all agreed that I think the best option would be to repeat a CT scan. We discussed repeating this in early January having her followup after that. They are comfortable with that plan for now.

## 2011-06-19 ENCOUNTER — Ambulatory Visit (HOSPITAL_BASED_OUTPATIENT_CLINIC_OR_DEPARTMENT_OTHER): Payer: Medicare Other | Admitting: Oncology

## 2011-06-19 ENCOUNTER — Telehealth: Payer: Self-pay | Admitting: *Deleted

## 2011-06-19 ENCOUNTER — Other Ambulatory Visit (HOSPITAL_BASED_OUTPATIENT_CLINIC_OR_DEPARTMENT_OTHER): Payer: Medicare Other | Admitting: Lab

## 2011-06-19 VITALS — BP 151/74 | HR 98 | Temp 98.2°F | Ht 60.0 in | Wt 160.0 lb

## 2011-06-19 DIAGNOSIS — K869 Disease of pancreas, unspecified: Secondary | ICD-10-CM

## 2011-06-19 DIAGNOSIS — C259 Malignant neoplasm of pancreas, unspecified: Secondary | ICD-10-CM

## 2011-06-19 DIAGNOSIS — K8689 Other specified diseases of pancreas: Secondary | ICD-10-CM

## 2011-06-19 LAB — CBC WITH DIFFERENTIAL/PLATELET
BASO%: 0.8 % (ref 0.0–2.0)
Basophils Absolute: 0.1 10*3/uL (ref 0.0–0.1)
EOS%: 1.6 % (ref 0.0–7.0)
HCT: 33.4 % — ABNORMAL LOW (ref 34.8–46.6)
HGB: 11 g/dL — ABNORMAL LOW (ref 11.6–15.9)
LYMPH%: 33.4 % (ref 14.0–49.7)
MCH: 26.1 pg (ref 25.1–34.0)
MCHC: 32.8 g/dL (ref 31.5–36.0)
NEUT%: 57.8 % (ref 38.4–76.8)
Platelets: 383 10*3/uL (ref 145–400)
lymph#: 3.4 10*3/uL — ABNORMAL HIGH (ref 0.9–3.3)

## 2011-06-19 LAB — COMPREHENSIVE METABOLIC PANEL
ALT: 13 U/L (ref 0–35)
AST: 17 U/L (ref 0–37)
BUN: 13 mg/dL (ref 6–23)
CO2: 26 mEq/L (ref 19–32)
Calcium: 9.7 mg/dL (ref 8.4–10.5)
Chloride: 102 mEq/L (ref 96–112)
Creatinine, Ser: 0.53 mg/dL (ref 0.50–1.10)
Total Bilirubin: 0.4 mg/dL (ref 0.3–1.2)

## 2011-06-19 LAB — CANCER ANTIGEN 19-9: CA 19-9: 15 U/mL (ref <35.0)

## 2011-06-19 NOTE — Patient Instructions (Signed)
Acute Pancreatitis The pancreas is a large gland located behind your stomach. It produces (secretes) enzymes. These enzymes help digest food. It also releases the hormones glucagon and insulin. These hormones help regulate blood sugar. When the pancreas becomes inflamed, the disease is called pancreatitis. Inflammation of the pancreas occurs when enzymes from the pancreas begin attacking and digesting the pancreas. CAUSES  Most cases ofsudden onset (acute) pancreatitis are caused by:  Alcohol abuse.   Gallstones.  Other less common causes are:  Some medications.   Exposure to certain chemicals   Infection.   Damage caused by an accident (trauma).   Surgery of the belly (abdomen).  SYMPTOMS  Acute pancreatitis usually begins with pain in the upper abdomen and may radiate to the back. This pain may last a couple days. The constant pain varies from mild to severe. The acute form of this disease may vary from mild, nonspecific abdominal pain to profound shock with coma. About 1 in 5 cases are severe. These patients become dehydrated and develop low blood pressure. In severe cases, bleeding into the pancreas can lead to shock and death. The lungs, heart, and kidneys may fail. DIAGNOSIS  Your caregiver will form a clinical opinion after giving you an exam. Laboratory work is used to confirm this diagnosis. Often,a digestive enzyme from the pancreas (serum amylase) and other enzymes are elevated. Sugars and fats (lipids) in the blood may be elevated. There may also be changes in the following levels: calcium, magnesium, potassium, chloride and bicarbonate (chemicals in the blood). X-rays, a CT scan, or ultrasound of your abdomen may be necessary to search for other causes of your abdominal pain. TREATMENT  Most pancreatitis requires treatment of symptoms. Most acute attacks last a couple of days. Your caregiver can discuss the treatment options with you.  If complications occur, hospitalization  may be necessary for pain control and intravenous (IV) fluid replacement.   Sometimes, a tube may be put into the stomach to control vomiting.   Food may not be allowed for 3 to 4 days. This gives the pancreas time to rest. Giving the pancreas a rest means there is no stimulation that would produce more enzymes and cause more damage.   Medicines (antibiotics) that kill germs may be given if infection is the cause.   Sometimes, surgery may be required.   Following an acute attack, your caregiver will determine the cause, if possible, and offer suggestions to prevent recurrences.  HOME CARE INSTRUCTIONS   Eat smaller, more frequent meals. This reduces the amount of digestive juices the pancreas produces.   Decrease the amount of fat in your diet. This may help reduce loose, diarrheal stools.   Drink enough water and fluids to keep your urine clear or pale yellow. This is to avoid dehydration which can cause increased pain.   Talk to your caregiver about pain relievers or other medicines that may help.   Avoid anything that may have triggered your pancreatitis (for example, alcohol).   Follow the diet advised by your caregiver. Do not advance the diet too soon.   Take medicines as prescribed.   Get plenty of rest.   Check your blood sugar at home as directed by your caregiver.   If your caregiver has given you a follow-up appointment, it is very important to keep that appointment. Not keeping the appointment could result in a lasting (chronic) or permanent injury, pain, and disability. If there is any problem keeping the appointment, you must call to reschedule.    SEEK MEDICAL CARE IF:   You are not recovering in the time described by your caregiver.   You have persistent pain, weakness, or feel sick to your stomach (nauseous).   You have recovered and then have another bout of pain.  SEEK IMMEDIATE MEDICAL CARE IF:   You are unable to eat or keep fluids down.   Your pain  increases a lot or changes.   You have an oral temperature above 102 F (38.9 C), not controlled by medicine.   Your skin or the white part of your eyes look yellow (jaundice).   You develop vomiting.   You feel dizzy or faint.   Your blood sugar is high (over 300).  MAKE SURE YOU:   Understand these instructions.   Will watch your condition.   Will get help right away if you are not doing well or get worse.  Document Released: 06/22/2005 Document Revised: 03/04/2011 Document Reviewed: 02/03/2008 [employer] Patient Information 2012 [employer]. 

## 2011-06-19 NOTE — Telephone Encounter (Signed)
gave patient appointment for 08-20-2011 starting at 1:30pm with labs

## 2011-06-22 ENCOUNTER — Telehealth (INDEPENDENT_AMBULATORY_CARE_PROVIDER_SITE_OTHER): Payer: Self-pay

## 2011-06-22 NOTE — Telephone Encounter (Signed)
Called [facility] scheduling about Dr Dwain Sarna ordering a CT on the pt already but wanted me to add pacreatic protocol to the order. Per Lyla Son she added it to the CT order for 07-09-11 with pancreatic protocol./ [facility]

## 2011-06-24 ENCOUNTER — Telehealth: Payer: Self-pay | Admitting: *Deleted

## 2011-06-24 NOTE — Progress Notes (Signed)
Pt.notified

## 2011-06-24 NOTE — Telephone Encounter (Signed)
Message copied by Cooper Render on Wed Jun 24, 2011  3:28 PM ------      Message from: Victorino December      Created: Tue Jun 23, 2011 11:48 PM       Call patient:tumor marker normal

## 2011-06-29 NOTE — Progress Notes (Signed)
OFFICE PROGRESS NOTE  CC  [name], MD [address] [facility] [place], [place]. [place] [address]  DIAGNOSIS:  77 year old female with pancreatic mass unclear etiology possibly a pancreatic cancer versus just pancreatitis which is chronic  PRIOR THERAPY:Patient has had multiple biopsies performed including endoscopic ultrasound guided biopsies as well as an open biopsy without a definitive diagnosis  CURRENT THERAPY:observation  INTERVAL HISTORY: Brandy [name] 77 y.o. female returns for followup visit today. She recently had an endoscopic ultrasound-guided biopsy performed by Dr. Rob Bunting unfortunately.did not reveal a diagnosis of a malignancy. There was a lot of chronic inflammation. Clinically patient continues to have some pain. She occasionally does have nausea. Otherwise denies any fevers chills night sweats or headaches. She is denying having any hematuria hematochezia or melena. Remainder of the 10 point review of systems is negative.  MEDICAL HISTORY: Past Medical History  Diagnosis Date  . Arthritis   . Asthma   . GERD (gastroesophageal reflux disease)   . Hyperlipidemia   . Hypertension     ALLERGIES:   has no known allergies.  MEDICATIONS:  Current Outpatient Prescriptions  Medication Sig Dispense Refill  . amLODipine (NORVASC) 10 MG tablet daily.      Marland Kitchen DIOVAN HCT 160-12.5 MG per tablet daily.      . simvastatin (ZOCOR) 20 MG tablet daily.      . traMADol (ULTRAM) 50 MG tablet         SURGICAL HISTORY:  Past Surgical History  Procedure Date  . Breast biopsy 1998    left  . Total shoulder replacement 2011    left  . Eus 06/04/2011    Procedure: UPPER ENDOSCOPIC ULTRASOUND (EUS) LINEAR;  Surgeon: Rob Bunting, MD;  Location: [facility];  Service: Endoscopy;  Laterality: N/A;  . Exploratory laparotomy     attempted biopsy of intra-abdominal mass    REVIEW OF SYSTEMS:  Pertinent items are noted in HPI.   PHYSICAL  EXAMINATION: an examination was not performed  ECOG PERFORMANCE STATUS: 1 - Symptomatic but completely ambulatory  Blood pressure 151/74, pulse 98, temperature 98.2 F (36.8 C), temperature source Oral, height 5' (1.524 m), weight 160 lb (72.576 kg).  LABORATORY DATA: Lab Results  Component Value Date   WBC 10.0 06/19/2011   HGB 11.0* 06/19/2011   HCT 33.4* 06/19/2011   MCV 79.6 06/19/2011   PLT 383 06/19/2011      Chemistry      Component Value Date/Time   NA 139 06/19/2011 1202   K 3.7 06/19/2011 1202   CL 102 06/19/2011 1202   CO2 26 06/19/2011 1202   BUN 13 06/19/2011 1202   CREATININE 0.53 06/19/2011 1202      Component Value Date/Time   CALCIUM 9.7 06/19/2011 1202   ALKPHOS 85 06/19/2011 1202   AST 17 06/19/2011 1202   ALT 13 06/19/2011 1202   BILITOT 0.4 06/19/2011 1202       RADIOGRAPHIC STUDIES:  No results found.  ASSESSMENT: 77 year old female with pancreatic mass unclear etiology could be chronic pancreatitis. A definitive diagnosis of the malignancy has not been made in spite of multiple attempts at biopsying this area. Likelihood is that patient has chronic pancreatitis. He most recent biopsy results were discussed with the patient and her daughter. I have also spoken to Dr. Emelia Loron. Who has seen the patient just recently.   PLAN: recommendation at this time is to stop patient see Dr. Dwain Sarna in about a month he will do  another CT of the abdomen with pancreatic protocol. I also offered a second opinion to the patient and her daughter today at this time would like to hold off on that. Patient was offered counseling we discussed the situation at great length. We also discussed what chronic pancreatitis is all about and I gave literature to them on this.   All questions were answered. The patient knows to call the clinic with any problems, questions or concerns. We can certainly see the patient much sooner if necessary.  I spent 30 minutes  counseling the patient face to face. The total time spent in the appointment was 30 minutes.    Drue Second, MD Medical/Oncology [facility] [phone] (beeper) [phone] (Office)  06/29/2011, 2:06 PM

## 2011-07-09 ENCOUNTER — Ambulatory Visit (HOSPITAL_COMMUNITY)
Admission: RE | Admit: 2011-07-09 | Discharge: 2011-07-09 | Disposition: A | Discharge status: Home / Self Care | Payer: Medicare Other | Source: Ambulatory Visit | Attending: General Surgery | Admitting: General Surgery

## 2011-07-09 DIAGNOSIS — R1909 Other intra-abdominal and pelvic swelling, mass and lump: Secondary | ICD-10-CM | POA: Insufficient documentation

## 2011-07-09 DIAGNOSIS — Z09 Encounter for follow-up examination after completed treatment for conditions other than malignant neoplasm: Secondary | ICD-10-CM | POA: Insufficient documentation

## 2011-07-09 MED ORDER — IOHEXOL 300 MG/ML  SOLN
110.0000 mL | Freq: Once | INTRAMUSCULAR | Status: AC | PRN
  Administered 2011-07-09: 110 mL via INTRAVENOUS

## 2011-07-10 ENCOUNTER — Ambulatory Visit: Payer: Medicare Other | Admitting: Oncology

## 2011-07-22 ENCOUNTER — Telehealth: Payer: Self-pay | Admitting: *Deleted

## 2011-07-22 NOTE — Telephone Encounter (Signed)
patient confirmed over the phone the new date and time starting at 12:30pm

## 2011-07-23 ENCOUNTER — Encounter (INDEPENDENT_AMBULATORY_CARE_PROVIDER_SITE_OTHER): Payer: Self-pay | Admitting: General Surgery

## 2011-07-23 ENCOUNTER — Ambulatory Visit (INDEPENDENT_AMBULATORY_CARE_PROVIDER_SITE_OTHER): Payer: Medicare Other | Admitting: General Surgery

## 2011-07-23 VITALS — BP 118/70 | HR 76 | Resp 16 | Ht 60.0 in | Wt 157.0 lb

## 2011-07-23 DIAGNOSIS — R1900 Intra-abdominal and pelvic swelling, mass and lump, unspecified site: Secondary | ICD-10-CM

## 2011-07-23 DIAGNOSIS — Z09 Encounter for follow-up examination after completed treatment for conditions other than malignant neoplasm: Secondary | ICD-10-CM

## 2011-07-23 NOTE — Progress Notes (Signed)
Subjective:     Patient ID: Brandy [name], female   DOB: 28-Feb-1933, 78 y.o.   MRN: [id number]  HPI This is a 23 her old female who was initially referred for evaluation or a retroperitoneal mass. This underwent an attempted biopsy by endoscopic ultrasound by Dr. Christella Hartigan. Following a nondiagnostic biopsy she underwent a diagnostic laparoscopy followed by laparotomy.All there were nonconclusive biopsies are mostly consistent with an inflammatory process. Her CA 19-9 has been normal. She had a repeat endoscopic ultrasound that showed the same result. I repeated her CT scan recently which shows a lot of the resolution of this area likely indicating this is a benign process. She still complains of some pain when she eats. She complains of also some incisional pain at this point. She is otherwise very slowly improving I think.  Review of Systems CT ABDOMEN AND PELVIS WITH CONTRAST  Technique: Multidetector CT imaging of the abdomen and pelvis was  performed following the standard protocol during bolus  administration of intravenous contrast.  Contrast: 110 ml Omnipaque-300 IV  Comparison: [facility] PET CT dated 04/01/2011. [place]  Imaging CT abdomen/pelvis dated 03/25/2011.  Findings: Lung bases are essentially clear.  Liver, spleen, and adrenal glands are within normal limits.  Near complete resolution of prior abnormal peripancreatic soft  tissue with mild residual stranding around the SMA (series 5/image  39).  Gallbladder is unremarkable. No intrahepatic ductal dilatation.  Common duct measures 9 mm but tapers normally at the level of the  ampulla.  Bilateral renal cysts and renal sinus cysts. No hydronephrosis.  No evidence of bowel obstruction.  Atherosclerotic calcifications of the abdominal aorta and branch  vessels.  No abdominopelvic ascites.  No suspicious abdominopelvic lymphadenopathy.  Uterus is unremarkable. No adnexal masses.  Bladder is within normal limits.    Postsurgical changes in the midline anterior abdominal wall.  Degenerative changes of the visualized thoracolumbar spine.  IMPRESSION:  Near complete resolution of prior abnormal peripancreatic soft  tissue, as described above. This appearance favors a benign  infectious/inflammatory process such as pancreatitis, less likely  some form of retroperitoneal fibrosis.      Objective:   Physical Exam  Abdominal: Soft. There is no tenderness.         Assessment:     Likely pancreatitis    Plan:     I think this does appear to be a benign process. This is done better by CT scan. This would also explain all the results of the biopsies as well. I favor continuing to follow her and I will repeat a CAT scan in 3 months see resolution of this area. I should call me sooner if needed. I think her wound healed by secondary intention and has been cleaned up a little bit.

## 2011-08-20 ENCOUNTER — Telehealth: Payer: Self-pay | Admitting: Oncology

## 2011-08-20 ENCOUNTER — Other Ambulatory Visit (HOSPITAL_BASED_OUTPATIENT_CLINIC_OR_DEPARTMENT_OTHER): Payer: Medicare Other | Admitting: Lab

## 2011-08-20 ENCOUNTER — Ambulatory Visit (HOSPITAL_BASED_OUTPATIENT_CLINIC_OR_DEPARTMENT_OTHER): Payer: Medicare Other | Admitting: Oncology

## 2011-08-20 VITALS — BP 137/69 | HR 90 | Temp 98.6°F | Ht 60.0 in | Wt 163.8 lb

## 2011-08-20 DIAGNOSIS — R1900 Intra-abdominal and pelvic swelling, mass and lump, unspecified site: Secondary | ICD-10-CM

## 2011-08-20 LAB — COMPREHENSIVE METABOLIC PANEL
AST: 24 U/L (ref 0–37)
Albumin: 3.7 g/dL (ref 3.5–5.2)
BUN: 23 mg/dL (ref 6–23)
CO2: 26 mEq/L (ref 19–32)
Calcium: 9.3 mg/dL (ref 8.4–10.5)
Chloride: 107 mEq/L (ref 96–112)
Creatinine, Ser: 0.72 mg/dL (ref 0.50–1.10)
Glucose, Bld: 111 mg/dL — ABNORMAL HIGH (ref 70–99)
Potassium: 4.4 mEq/L (ref 3.5–5.3)

## 2011-08-20 LAB — CBC WITH DIFFERENTIAL/PLATELET
Basophils Absolute: 0.1 10*3/uL (ref 0.0–0.1)
Eosinophils Absolute: 0.1 10*3/uL (ref 0.0–0.5)
HCT: 32.5 % — ABNORMAL LOW (ref 34.8–46.6)
HGB: 10.9 g/dL — ABNORMAL LOW (ref 11.6–15.9)
MONO#: 0.6 10*3/uL (ref 0.1–0.9)
NEUT#: 5.6 10*3/uL (ref 1.5–6.5)
NEUT%: 57.2 % (ref 38.4–76.8)
RDW: 18 % — ABNORMAL HIGH (ref 11.2–14.5)
lymph#: 3.4 10*3/uL — ABNORMAL HIGH (ref 0.9–3.3)

## 2011-08-20 NOTE — Progress Notes (Signed)
OFFICE PROGRESS NOTE  CC  [name], MD, MD [address] [place] [address]  DIAGNOSIS:  78 year old female with pancreatic mass unclear etiology possibly a pancreatic cancer versus just pancreatitis which is chronic  PRIOR THERAPY: 1.Patient has had multiple biopsies performed including endoscopic ultrasound guided biopsies as well as an open biopsy without a definitive diagnosis  CURRENT THERAPY:observation  INTERVAL HISTORY: Brandy [name] 78 y.o. female returns for followup visit today. Overall she is doing well. She had a CT abdomen performed in January that showed the retroperitoneal mass to be nearly gone. She overall feels well. No fevers or chills, no nausea or vomiting. Remainder of the 10 point review of systems is negative.  MEDICAL HISTORY: Past Medical History  Diagnosis Date  . Arthritis   . Asthma   . GERD (gastroesophageal reflux disease)   . Hyperlipidemia   . Hypertension     ALLERGIES:   has no known allergies.  MEDICATIONS:  Current Outpatient Prescriptions  Medication Sig Dispense Refill  . amLODipine (NORVASC) 10 MG tablet daily.      . citalopram (CELEXA) 10 MG tablet Take 10 mg by mouth daily.      Marland Kitchen DIOVAN HCT 160-12.5 MG per tablet daily.      Marland Kitchen LORazepam (ATIVAN) 0.5 MG tablet Take 0.5 mg by mouth every 8 (eight) hours.      . simvastatin (ZOCOR) 20 MG tablet daily.      . traMADol (ULTRAM) 50 MG tablet       . zolpidem (AMBIEN) 5 MG tablet Take 5 mg by mouth at bedtime as needed.        SURGICAL HISTORY:  Past Surgical History  Procedure Date  . Breast biopsy 1998    left  . Total shoulder replacement 2011    left  . Eus 06/04/2011    Procedure: UPPER ENDOSCOPIC ULTRASOUND (EUS) LINEAR;  Surgeon: Rob Bunting, MD;  Location: [facility];  Service: Endoscopy;  Laterality: N/A;  . Exploratory laparotomy     attempted biopsy of intra-abdominal mass    REVIEW OF SYSTEMS:  Pertinent items are noted in HPI.    PHYSICAL EXAMINATION: an examination was not performed  ECOG PERFORMANCE STATUS: 1 - Symptomatic but completely ambulatory  Blood pressure 137/69, pulse 90, temperature 98.6 F (37 C), temperature source Oral, height 5' (1.524 m), weight 163 lb 12.8 oz (74.299 kg).  LABORATORY DATA: Lab Results  Component Value Date   WBC 9.8 08/20/2011   HGB 10.9* 08/20/2011   HCT 32.5* 08/20/2011   MCV 79.7 08/20/2011   PLT 306 08/20/2011      Chemistry      Component Value Date/Time   NA 139 06/19/2011 1202   K 3.7 06/19/2011 1202   CL 102 06/19/2011 1202   CO2 26 06/19/2011 1202   BUN 13 06/19/2011 1202   CREATININE 0.53 06/19/2011 1202      Component Value Date/Time   CALCIUM 9.7 06/19/2011 1202   ALKPHOS 85 06/19/2011 1202   AST 17 06/19/2011 1202   ALT 13 06/19/2011 1202   BILITOT 0.4 06/19/2011 1202       RADIOGRAPHIC STUDIES: RADIOLOGY REPORT*  Clinical Data: Follow-up retroperitoneal mass, status post EUS and  ex lap with negative biopsies  CT ABDOMEN AND PELVIS WITH CONTRAST  Technique: Multidetector CT imaging of the abdomen and pelvis was  performed following the standard protocol during bolus  administration of intravenous contrast.  Contrast: 110 ml Omnipaque-300 IV  Comparison: [facility]  PET CT dated 04/01/2011. [place]  Imaging CT abdomen/pelvis dated 03/25/2011.  Findings: Lung bases are essentially clear.  Liver, spleen, and adrenal glands are within normal limits.  Near complete resolution of prior abnormal peripancreatic soft  tissue with mild residual stranding around the SMA (series 5/image  39).  Gallbladder is unremarkable. No intrahepatic ductal dilatation.  Common duct measures 9 mm but tapers normally at the level of the  ampulla.  Bilateral renal cysts and renal sinus cysts. No hydronephrosis.  No evidence of bowel obstruction.  Atherosclerotic calcifications of the abdominal aorta and branch  vessels.  No abdominopelvic ascites.  No  suspicious abdominopelvic lymphadenopathy.  Uterus is unremarkable. No adnexal masses.  Bladder is within normal limits.  Postsurgical changes in the midline anterior abdominal wall.  Degenerative changes of the visualized thoracolumbar spine.  IMPRESSION:  Near complete resolution of prior abnormal peripancreatic soft  tissue, as described above. This appearance favors a benign  infectious/inflammatory process such as pancreatitis, less likely  some form of retroperitoneal fibrosis.  Original Report Authenticated By: Charline Bills, M.D.  No results found.  ASSESSMENT: 78 year old female with:  1.  pancreatic mass unclear etiology could be chronic pancreatitis. A definitive diagnosis of the malignancy has not been made in spite of multiple attempts at biopsying this area. Likelihood is that patient has chronic pancreatitis. He most recent biopsy results were discussed with the patient and her daughter. 2. Recent scans of abdomen revealed near resolution of the retroperitoneal mass     PLAN: 1. Retroperitoneal mass now nearly resolved 2. She has another scan ordered in April and Dr. Dwain Sarna will see her as well 3. I will see her in 1 year or sooner if need arises.  All questions were answered. The patient knows to call the clinic with any problems, questions or concerns. We can certainly see the patient much sooner if necessary.  I spent 20 minutes counseling the patient face to face. The total time spent in the appointment was 30 minutes.    Drue Second, MD Medical/Oncology [facility] [phone] (beeper) [phone] (Office)  08/20/2011, 1:37 PM

## 2011-08-20 NOTE — Telephone Encounter (Signed)
gve the pt her feb 2014 appt calendar °

## 2011-08-20 NOTE — Patient Instructions (Signed)
You are doing well and I will keep an eye on it. Keep your appointment with Dr. Dwain Sarna  I will see you back in 1 year (08/19/12)

## 2011-08-24 ENCOUNTER — Telehealth (INDEPENDENT_AMBULATORY_CARE_PROVIDER_SITE_OTHER): Payer: Self-pay | Admitting: General Surgery

## 2011-09-21 ENCOUNTER — Ambulatory Visit (HOSPITAL_COMMUNITY)
Admission: RE | Admit: 2011-09-21 | Discharge: 2011-09-21 | Disposition: A | Discharge status: Home / Self Care | Payer: Medicare Other | Source: Ambulatory Visit | Attending: General Surgery | Admitting: General Surgery

## 2011-09-21 DIAGNOSIS — K449 Diaphragmatic hernia without obstruction or gangrene: Secondary | ICD-10-CM | POA: Insufficient documentation

## 2011-09-21 DIAGNOSIS — R112 Nausea with vomiting, unspecified: Secondary | ICD-10-CM | POA: Insufficient documentation

## 2011-09-21 DIAGNOSIS — R109 Unspecified abdominal pain: Secondary | ICD-10-CM | POA: Insufficient documentation

## 2011-09-21 DIAGNOSIS — R1900 Intra-abdominal and pelvic swelling, mass and lump, unspecified site: Secondary | ICD-10-CM

## 2011-09-21 MED ORDER — IOHEXOL 300 MG/ML  SOLN
100.0000 mL | Freq: Once | INTRAMUSCULAR | Status: AC | PRN
  Administered 2011-09-21: 100 mL via INTRAVENOUS

## 2011-09-25 ENCOUNTER — Telehealth (INDEPENDENT_AMBULATORY_CARE_PROVIDER_SITE_OTHER): Payer: Self-pay

## 2011-09-25 NOTE — Telephone Encounter (Signed)
Called pt to notify her that her CT scan continues to look better per Dr Dwain Sarna. The pt has a f/u appt on 10-22-11.

## 2011-10-22 ENCOUNTER — Ambulatory Visit (INDEPENDENT_AMBULATORY_CARE_PROVIDER_SITE_OTHER): Payer: Medicare Other | Admitting: General Surgery

## 2011-10-22 ENCOUNTER — Encounter (INDEPENDENT_AMBULATORY_CARE_PROVIDER_SITE_OTHER): Payer: Self-pay | Admitting: General Surgery

## 2011-10-22 VITALS — BP 140/70 | HR 76 | Temp 98.3°F | Resp 16 | Ht 60.0 in | Wt 163.8 lb

## 2011-10-22 DIAGNOSIS — R1900 Intra-abdominal and pelvic swelling, mass and lump, unspecified site: Secondary | ICD-10-CM

## 2011-10-22 NOTE — Progress Notes (Signed)
Subjective:     Patient ID: Brandy [name], female   DOB: [birth year], 78 y.o.   MRN: [id number]  HPI This is a 78 year old female who I met with weight loss, abdominal pain and a possible mass. She underwent an endoscopic ultrasound with biopsy that was nondiagnostic. I then took her to the operating room and only got biopsies really consistent with pancreatitis. She underwent a repeat endoscopic ultrasound a similar result. We decided at that point to follow her and I have done 2 CT scans since then. The latest one shows a further decrease in the soft tissue stranding seen in the SMA and around the pancreatic head. She saw some prominence of her extrahepatic common bile duct. There are no real changes in the scan continues to be improved. Again she clinically looks much better today and has gotten back a lot of her energy and his eating much better also.  Review of Systems CT ABDOMEN AND PELVIS WITH CONTRAST  Technique: Multidetector CT imaging of the abdomen and pelvis was  performed following the standard protocol during bolus  administration of intravenous contrast.  Contrast: OMNIPAQUE IOHEXOL 300 MG/ML IJ SOLN  Comparison: 07/09/2011  Findings: Further decrease in subtle soft tissue stranding is seen  in the region of the SMA and adjacent to the pancreatic head.  There is no evidence of pancreatic mass or ductal dilatation. Mild  prominence of the extra hepatic common bile duct is stable and  gallbladder is unremarkable in appearance.  Tiny hiatal hernia again noted. The liver, spleen and adrenal  glands are normal appearance. Small renal cysts are noted  bilaterally but there is no evidence of renal mass or  hydronephrosis.  No other abdominal or pelvic soft tissue masses are identified.  Uterus and adnexa are unremarkable. No evidence of inflammatory  process or abnormal fluid collections. No evidence of bowel wall  thickening or dilatation.  IMPRESSION:  1. Further decrease  in subtle retroperitoneal soft tissue  stranding in the region of the SMA and pancreatic head.  2. No new or progressive disease within the abdomen or pelvis.  3. Stable prominence of extrahepatic bile ducts. Consider  correlation with liver function tests.     Objective:   Physical Exam  Vitals reviewed. Abdominal: Normal appearance. There is no tenderness.         Assessment:    Resolving pancreatitis vs retroperitoneal fibrosis    Plan:     The CT scan shows that the serious improving still. She clinically is much better also. I think that this must have been an episode of pancreatitis or retroperitoneal fibrosis. I told her at this point with the appearance on the CT scan as well as her clinical appearance along with the prior biopsies he would be fine just to see her back as needed. Whenever this is his clearly resolving. It is not a malignancy given its clinical course as well as the biopsy results. They will call me back as needed.

## 2011-11-23 ENCOUNTER — Other Ambulatory Visit: Payer: Self-pay | Admitting: Radiology

## 2011-11-23 DIAGNOSIS — C50919 Malignant neoplasm of unspecified site of unspecified female breast: Secondary | ICD-10-CM | POA: Insufficient documentation

## 2011-11-24 ENCOUNTER — Other Ambulatory Visit: Payer: Self-pay | Admitting: Radiology

## 2011-11-24 DIAGNOSIS — C50912 Malignant neoplasm of unspecified site of left female breast: Secondary | ICD-10-CM

## 2011-12-01 ENCOUNTER — Encounter (INDEPENDENT_AMBULATORY_CARE_PROVIDER_SITE_OTHER): Payer: Self-pay | Admitting: General Surgery

## 2011-12-01 ENCOUNTER — Ambulatory Visit (INDEPENDENT_AMBULATORY_CARE_PROVIDER_SITE_OTHER): Payer: Medicare Other | Admitting: General Surgery

## 2011-12-01 VITALS — BP 142/72 | HR 70 | Resp 16 | Ht 61.0 in | Wt 168.0 lb

## 2011-12-01 DIAGNOSIS — C50412 Malignant neoplasm of upper-outer quadrant of left female breast: Secondary | ICD-10-CM | POA: Insufficient documentation

## 2011-12-01 DIAGNOSIS — C50419 Malignant neoplasm of upper-outer quadrant of unspecified female breast: Secondary | ICD-10-CM

## 2011-12-01 NOTE — Patient Instructions (Signed)
[facility] °[phone] ° °MASTECTOMY: POST OP INSTRUCTIONS ° °Always review your discharge instruction sheet given to you by the facility where your surgery was performed. °IF YOU HAVE DISABILITY OR FAMILY LEAVE FORMS, YOU MUST BRING THEM TO THE OFFICE FOR PROCESSING.   °DO NOT GIVE THEM TO YOUR DOCTOR. °A prescription for pain medication may be given to you upon discharge.  Take your pain medication as prescribed, if needed.  If narcotic pain medicine is not needed, then you may take acetaminophen (Tylenol), naprosyn (Alleve) or ibuprofen (Advil) as needed. °1. Take your usually prescribed medications unless otherwise directed. °2. If you need a refill on your pain medication, please contact your pharmacy.  They will contact our office to request authorization.  Prescriptions will not be filled after 5pm or on week-ends. °3. You should follow a light diet the first few days after arrival home, such as soup and crackers, etc.  Resume your normal diet the day after surgery. °4. Most patients will experience some swelling and bruising on the chest and underarm.  Ice packs will help.  Swelling and bruising can take several days to resolve. Wear the binder day and night until you return to the office.  °5. It is common to experience some constipation if taking pain medication after surgery.  Increasing fluid intake and taking a stool softener (such as Colace) will usually help or prevent this problem from occurring.  A mild laxative (Milk of Magnesia or Miralax) should be taken according to package instructions if there are no bowel movements after 48 hours. °6. Unless discharge instructions indicate otherwise, leave your bandage dry and in place until your next appointment in 3-5 days.  You may take a limited sponge bath.  No tube baths or showers until the drains are removed.  You may have steri-strips (small skin tapes) in place directly over the incision.  These strips should be left on the  skin for 7-10 days. If you have glue it will come off in next couple week.  Any sutures will be removed at an office visit °7. DRAINS:  If you have drains in place, it is important to keep a list of the amount of drainage produced each day in your drains.  Before leaving the hospital, you should be instructed on drain care.  Call our office if you have any questions about your drains. I will remove your drains when they put out less than 30 cc or ml for 2 consecutive days. °8. ACTIVITIES:  You may resume regular (light) daily activities beginning the next day--such as daily self-care, walking, climbing stairs--gradually increasing activities as tolerated.  You may have sexual intercourse when it is comfortable.  Refrain from any heavy lifting or straining until approved by your doctor. °a. You may drive when you are no longer taking prescription pain medication, you can comfortably wear a seatbelt, and you can safely maneuver your car and apply brakes. °b. RETURN TO WORK:  __________________________________________________________ °9. You should see your doctor in the office for a follow-up appointment approximately 3-5 days after your surgery.  Your doctor’s nurse will typically make your follow-up appointment when she calls you with your pathology report.  Expect your pathology report 3-4business days after surgery. °10. OTHER INSTRUCTIONS: ______________________________________________________________________________________________ ____________________________________________________________________________________________ °WHEN TO CALL YOUR DR Mackson Botz: °1. Fever over 101.0 °2. Nausea and/or vomiting °3. Extreme swelling or bruising °4. Continued bleeding from incision. °5. Increased pain, redness, or drainage from the incision. °The clinic staff is available   to answer your questions during regular business hours.  Please don’t hesitate to call and ask to speak to one of the nurses for clinical concerns.  If  you have a medical emergency, go to the nearest emergency room or call 911.  A surgeon from [facility] is always on call at the hospital. °[address], [address], [place], [place]  [address] ? [address] [address], [place], [place]   [address] °[phone] ? [phone] ? FAX [phone] °Web site: [email or web address] ° °

## 2011-12-01 NOTE — Progress Notes (Signed)
Patient ID: Brandy [name], female   DOB: 06/27/1933, 78 y.o.   MRN: [id number]  Chief Complaint  Patient presents with  . Other    Eval new br ca    HPI Brandy [name] is a 78 y.o. female.  Referred by Dr. Margaret Bertrand HPI This is a 78 yof who I know well from laparotomy for retroperitoneal mass that now appears to be chronic pancreatitis and is resolving.  We have followed this and it is continuing to get better on ct scan  Our last visit her appetite was better and she was beginning to have some energy.  She then palpated a left breast mass for about 2 weeks before presenting for mmg/us in mid May.  She complained of no pain or discharge.  Her mmg showed no abnormality on the right with a fatty -replaced breast.  The left side corresponding to the palpable abnormality there is a 1.8 cm hyperdense mass that is spiculated containing calcs.  There is another irregular linear distribution and a 5mm irregular nodule in the UOQ.  US shows at least a 1.8 cm mass without a correlate for the other mass.  US guided core with clip placement was performed of the larger mass. Stereo biopsy of the other was performed.  The first biopsy shows IDC with DCIS and LVI.  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  Prognostic panel is pending.  She comes in today with her family to discuss.  Past Medical History  Diagnosis Date  . Arthritis   . Asthma   . GERD (gastroesophageal reflux disease)   . Hyperlipidemia   . Hypertension     Past Surgical History  Procedure Date  . Breast biopsy 1998    left  . Total shoulder replacement 2011    left  . Eus 06/04/2011    Procedure: UPPER ENDOSCOPIC ULTRASOUND (EUS) LINEAR;  Surgeon: Daniel Jacobs, MD;  Location: [facility];  Service: Endoscopy;  Laterality: N/A;  . Exploratory laparotomy      biopsy of intra-abdominal mass    Family History  Problem Relation Age of Onset  . Cancer Father     prostate    Social  History History  Substance Use Topics  . Smoking status: Never Smoker   . Smokeless tobacco: Never Used  . Alcohol Use: No    No Known Allergies  Current Outpatient Prescriptions  Medication Sig Dispense Refill  . amLODipine (NORVASC) 10 MG tablet daily.      . citalopram (CELEXA) 10 MG tablet Take 10 mg by mouth daily.      . DIOVAN HCT 160-12.5 MG per tablet daily.      . LORazepam (ATIVAN) 0.5 MG tablet Take 0.5 mg by mouth every 8 (eight) hours.      . simvastatin (ZOCOR) 20 MG tablet daily.      . traMADol (ULTRAM) 50 MG tablet       . zolpidem (AMBIEN) 5 MG tablet Take 5 mg by mouth at bedtime as needed.        Review of Systems Review of Systems  Blood pressure 142/72, pulse 70, resp. rate 16, height 5' 1" (1.549 m), weight 168 lb (76.204 kg).  Physical Exam Physical Exam  Vitals reviewed. Constitutional: She appears well-developed and well-nourished.  Neck: Neck supple.  Cardiovascular: Normal rate, regular rhythm and normal heart sounds.   Pulmonary/Chest: Effort normal and breath sounds normal. She has no wheezes. She has no rales.   Right breast exhibits no inverted nipple, no mass, no nipple discharge, no skin change and no tenderness. Left breast exhibits mass. Left breast exhibits no inverted nipple, no nipple discharge, no skin change and no tenderness. Breasts are symmetrical.    Lymphadenopathy:    She has no cervical adenopathy.    She has no axillary adenopathy.       Right: No supraclavicular adenopathy present.       Left: No supraclavicular adenopathy present.    Data Reviewed Mmg, [place] and path reviewed  Assessment    Left breast cancer, likely stage I    Plan    Left mastectomy, possible snbx   We discussed the staging and pathophysiology of breast cancer. We discussed all of the different options for treatment for breast cancer including surgery, chemotherapy, radiation therapy, Herceptin, and antiestrogen therapy.   We discussed a sentinel  lymph node biopsy as she does not appear to having lymph node involvement right now. I did tell her I would discuss with other disciplines if we need to do a sentinel node and I think we will also need to wait for the prognostic panel. We discussed the performance of that with injection of radioactive tracer and blue dye. We discussed that she would have an incision underneath her axillary hairline. We discussed that there is a bout a 10-20% chance of having a positive node with a sentinel lymph node biopsy and we will await the permanent pathology to make any other first further decisions in terms of her treatment. One of these options might be to return to the operating room to perform an axillary lymph node dissection. We discussed about a 1-2% risk lifetime of chronic shoulder pain as well as lymphedema associated with a sentinel lymph node biopsy.  We discussed the options for treatment of the breast cancer which included lumpectomy versus a mastectomy. We discussed the performance of the lumpectomy with a wire placement. We discussed a 10-20% chance of a positive margin requiring reexcision in the operating room. We also discussed that she may need radiation therapy or antiestrogen therapy or both if she undergoes lumpectomy. I am not entirely sure right now she could undergo BCT and would be reasonable to get an MR to evaluated these lesions. We discussed the mastectomy and the postoperative care for that as well. We discussed that there is no difference in her survival whether she undergoes lumpectomy with radiation therapy or antiestrogen therapy versus a mastectomy. There is a slight difference in the local recurrence rate being 3-5% with lumpectomy and about 1% with a mastectomy.We discussed plastic surgery referral if she chooses mastectomy. We discussed the risks of operation including bleeding, infection, possible reoperation, wound breakdown, flap death. She understands her further therapy will be  based on what her stages at the time of her operation.  She does not want to preserve her breast after this discussion.  Accordingly I will cancer her mri and we will schedule for mastectomy       Delesha Pohlman 12/01/2011, 9:19 PM    

## 2011-12-02 ENCOUNTER — Other Ambulatory Visit: Payer: Medicare Other

## 2011-12-03 ENCOUNTER — Other Ambulatory Visit: Payer: Medicare Other

## 2011-12-04 ENCOUNTER — Encounter (INDEPENDENT_AMBULATORY_CARE_PROVIDER_SITE_OTHER): Payer: Medicare Other | Admitting: General Surgery

## 2011-12-08 ENCOUNTER — Encounter (HOSPITAL_COMMUNITY): Payer: Self-pay | Admitting: Pharmacy Technician

## 2011-12-09 ENCOUNTER — Encounter (HOSPITAL_COMMUNITY): Payer: Self-pay | Admitting: *Deleted

## 2011-12-09 NOTE — Progress Notes (Signed)
Confirmed lab appointment with pt for 12/11/11 @ 1330

## 2011-12-09 NOTE — Pre-Procedure Instructions (Addendum)
20 Brandy [name]  12/09/2011   Your procedure is scheduled on:  Fri, June 14 @ 1:00 PM  Report to [facility] at 11:00 AM.  Call this number if you have problems the morning of surgery: [phone]   Remember:   Do not eat food:After Midnight.  May have clear liquids: up to 4 Hours before arrival.(until 7:00 AM)  Clear liquids include soda, tea, black coffee, apple or grape juice, broth,water  Take these medicines the morning of surgery with A SIP OF WATER: Amlodipine(Norvasc), Citalopram(Celexa), and Omeprazole(Prilosec)   Do not wear jewelry, make-up or nail polish.  Do not wear lotions, powders, or perfumes.  Do not shave 48 hours prior to surgery.   Do not bring valuables to the hospital.  Contacts, dentures or bridgework may not be worn into surgery.  Leave suitcase in the car. After surgery it may be brought to your room.  For patients admitted to the hospital, checkout time is 11:00 AM the day of discharge.   Patients discharged the day of surgery will not be allowed to drive home.    Special Instructions: CHG Shower Use Special Wash: 1/2 bottle night before surgery and 1/2 bottle morning of surgery.   Please read over the following fact sheets that you were given: Pain Booklet, Coughing and Deep Breathing, MRSA Information and Surgical Site Infection Prevention

## 2011-12-09 NOTE — Progress Notes (Addendum)
Pt doesn't have a cardiologist  Stress test done >6-55yrs ago Denies ever having an echo/heart cath  EKG in epic from 04/14/11  Medical MD is Dr.Lisa Hyacinth Meeker with [facility]   Last cxr > 2 yr ago

## 2011-12-11 ENCOUNTER — Ambulatory Visit (HOSPITAL_COMMUNITY)
Admission: RE | Admit: 2011-12-11 | Discharge: 2011-12-11 | Disposition: A | Discharge status: Home / Self Care | Payer: Medicare Other | Source: Ambulatory Visit | Attending: General Surgery | Admitting: General Surgery

## 2011-12-11 ENCOUNTER — Encounter: Payer: Self-pay | Admitting: *Deleted

## 2011-12-11 ENCOUNTER — Encounter (HOSPITAL_COMMUNITY)
Admission: RE | Admit: 2011-12-11 | Discharge: 2011-12-11 | Disposition: A | Discharge status: Home / Self Care | Payer: Medicare Other | Source: Ambulatory Visit | Attending: General Surgery | Admitting: General Surgery

## 2011-12-11 ENCOUNTER — Telehealth: Payer: Self-pay | Admitting: Oncology

## 2011-12-11 LAB — BASIC METABOLIC PANEL
BUN: 21 mg/dL (ref 6–23)
Calcium: 9.7 mg/dL (ref 8.4–10.5)
Creatinine, Ser: 0.69 mg/dL (ref 0.50–1.10)
GFR calc non Af Amer: 82 mL/min — ABNORMAL LOW (ref >90)
Glucose, Bld: 102 mg/dL — ABNORMAL HIGH (ref 70–99)
Sodium: 136 mEq/L (ref 135–145)

## 2011-12-11 LAB — CBC
Hemoglobin: 12.2 g/dL (ref 12.0–15.0)
MCH: 28 pg (ref 26.0–34.0)
MCHC: 32.9 g/dL (ref 30.0–36.0)

## 2011-12-11 LAB — SURGICAL PCR SCREEN: Staphylococcus aureus: NEGATIVE

## 2011-12-11 NOTE — Telephone Encounter (Signed)
S/w the pt's grandson and he is aware of the June appts with dr Welton Flakes

## 2011-12-17 MED ORDER — CEFAZOLIN SODIUM-DEXTROSE 2-3 GM-% IV SOLR
2.0000 g | INTRAVENOUS | Status: AC
  Administered 2011-12-18: 2 g via INTRAVENOUS
  Filled 2011-12-17: qty 50

## 2011-12-18 ENCOUNTER — Encounter (HOSPITAL_COMMUNITY): Payer: Self-pay

## 2011-12-18 ENCOUNTER — Ambulatory Visit (HOSPITAL_COMMUNITY): Payer: Medicare Other

## 2011-12-18 ENCOUNTER — Encounter (HOSPITAL_COMMUNITY): Payer: Self-pay | Admitting: General Practice

## 2011-12-18 ENCOUNTER — Encounter (HOSPITAL_COMMUNITY)
Admission: RE | Disposition: A | Discharge status: Home / Self Care | Payer: Self-pay | Source: Ambulatory Visit | Attending: General Surgery

## 2011-12-18 ENCOUNTER — Ambulatory Visit (HOSPITAL_COMMUNITY)
Admission: RE | Admit: 2011-12-18 | Discharge: 2011-12-20 | Disposition: A | Discharge status: Home / Self Care | Payer: Medicare Other | Source: Ambulatory Visit | Attending: General Surgery | Admitting: General Surgery

## 2011-12-18 ENCOUNTER — Encounter (HOSPITAL_COMMUNITY): Payer: Self-pay | Admitting: Anesthesiology

## 2011-12-18 ENCOUNTER — Ambulatory Visit (HOSPITAL_COMMUNITY)
Admission: RE | Admit: 2011-12-18 | Discharge: 2011-12-18 | Disposition: A | Discharge status: Home / Self Care | Payer: Medicare Other | Source: Ambulatory Visit | Attending: General Surgery | Admitting: General Surgery

## 2011-12-18 DIAGNOSIS — Z01812 Encounter for preprocedural laboratory examination: Secondary | ICD-10-CM | POA: Insufficient documentation

## 2011-12-18 DIAGNOSIS — E785 Hyperlipidemia, unspecified: Secondary | ICD-10-CM | POA: Insufficient documentation

## 2011-12-18 DIAGNOSIS — C773 Secondary and unspecified malignant neoplasm of axilla and upper limb lymph nodes: Secondary | ICD-10-CM | POA: Insufficient documentation

## 2011-12-18 DIAGNOSIS — C50919 Malignant neoplasm of unspecified site of unspecified female breast: Secondary | ICD-10-CM | POA: Insufficient documentation

## 2011-12-18 DIAGNOSIS — J45909 Unspecified asthma, uncomplicated: Secondary | ICD-10-CM | POA: Insufficient documentation

## 2011-12-18 DIAGNOSIS — M1990 Unspecified osteoarthritis, unspecified site: Secondary | ICD-10-CM | POA: Insufficient documentation

## 2011-12-18 DIAGNOSIS — K219 Gastro-esophageal reflux disease without esophagitis: Secondary | ICD-10-CM | POA: Insufficient documentation

## 2011-12-18 DIAGNOSIS — F3289 Other specified depressive episodes: Secondary | ICD-10-CM | POA: Insufficient documentation

## 2011-12-18 DIAGNOSIS — F329 Major depressive disorder, single episode, unspecified: Secondary | ICD-10-CM | POA: Insufficient documentation

## 2011-12-18 DIAGNOSIS — C50419 Malignant neoplasm of upper-outer quadrant of unspecified female breast: Secondary | ICD-10-CM

## 2011-12-18 DIAGNOSIS — I10 Essential (primary) hypertension: Secondary | ICD-10-CM | POA: Insufficient documentation

## 2011-12-18 DIAGNOSIS — D0590 Unspecified type of carcinoma in situ of unspecified breast: Secondary | ICD-10-CM | POA: Insufficient documentation

## 2011-12-18 DIAGNOSIS — K449 Diaphragmatic hernia without obstruction or gangrene: Secondary | ICD-10-CM | POA: Insufficient documentation

## 2011-12-18 DIAGNOSIS — Z01818 Encounter for other preprocedural examination: Secondary | ICD-10-CM | POA: Insufficient documentation

## 2011-12-18 HISTORY — DX: Insomnia, unspecified: G47.00

## 2011-12-18 HISTORY — DX: Personal history of urinary calculi: Z87.442

## 2011-12-18 HISTORY — DX: Depression, unspecified: F32.A

## 2011-12-18 HISTORY — PX: MASTECTOMY W/ SENTINEL NODE BIOPSY: SHX2001

## 2011-12-18 HISTORY — DX: Major depressive disorder, single episode, unspecified: F32.9

## 2011-12-18 HISTORY — DX: Bronchitis, not specified as acute or chronic: J40

## 2011-12-18 HISTORY — DX: Malignant neoplasm of unspecified site of unspecified female breast: C50.919

## 2011-12-18 HISTORY — DX: Dysphagia, unspecified: R13.10

## 2011-12-18 HISTORY — DX: Urgency of urination: R39.15

## 2011-12-18 HISTORY — DX: Personal history of other diseases of the digestive system: Z87.19

## 2011-12-18 SURGERY — MASTECTOMY WITH SENTINEL LYMPH NODE BIOPSY
Anesthesia: General | Site: Breast | Laterality: Left | Wound class: Clean

## 2011-12-18 MED ORDER — 0.9 % SODIUM CHLORIDE (POUR BTL) OPTIME
TOPICAL | Status: DC | PRN
  Administered 2011-12-18: 1000 mL

## 2011-12-18 MED ORDER — ACETAMINOPHEN 650 MG RE SUPP: 650.0000 mg | Freq: Four times a day (QID) | RECTAL | Status: DC | PRN

## 2011-12-18 MED ORDER — FENTANYL CITRATE 0.05 MG/ML IJ SOLN
50.0000 ug | INTRAMUSCULAR | Status: DC | PRN
  Administered 2011-12-18: 100 ug via INTRAVENOUS

## 2011-12-18 MED ORDER — TECHNETIUM TC 99M SULFUR COLLOID FILTERED
1.0000 | Freq: Once | INTRAVENOUS | Status: AC | PRN
  Administered 2011-12-18: 1 via INTRADERMAL

## 2011-12-18 MED ORDER — HYDROCHLOROTHIAZIDE 25 MG PO TABS
25.0000 mg | ORAL_TABLET | Freq: Every day | ORAL | Status: DC
  Administered 2011-12-18: 25 mg via ORAL
  Filled 2011-12-18 (×3): qty 1

## 2011-12-18 MED ORDER — LACTATED RINGERS IV SOLN
INTRAVENOUS | Status: DC
  Administered 2011-12-18: 12:00:00 via INTRAVENOUS

## 2011-12-18 MED ORDER — ACETAMINOPHEN 325 MG PO TABS
650.0000 mg | ORAL_TABLET | Freq: Four times a day (QID) | ORAL | Status: DC | PRN
  Filled 2011-12-18 (×2): qty 2

## 2011-12-18 MED ORDER — SIMVASTATIN 20 MG PO TABS
20.0000 mg | ORAL_TABLET | ORAL | Status: DC
  Administered 2011-12-18: 20 mg via ORAL
  Filled 2011-12-18: qty 1

## 2011-12-18 MED ORDER — IRBESARTAN 300 MG PO TABS
300.0000 mg | ORAL_TABLET | Freq: Every day | ORAL | Status: DC
  Administered 2011-12-18 – 2011-12-19 (×2): 300 mg via ORAL
  Filled 2011-12-18 (×3): qty 1

## 2011-12-18 MED ORDER — AMLODIPINE BESYLATE 10 MG PO TABS
10.0000 mg | ORAL_TABLET | Freq: Every day | ORAL | Status: DC
  Administered 2011-12-18 – 2011-12-20 (×2): 10 mg via ORAL
  Filled 2011-12-18 (×3): qty 1

## 2011-12-18 MED ORDER — FENTANYL CITRATE 0.05 MG/ML IJ SOLN
INTRAMUSCULAR | Status: DC | PRN
  Administered 2011-12-18: 25 ug via INTRAVENOUS
  Administered 2011-12-18: 50 ug via INTRAVENOUS

## 2011-12-18 MED ORDER — DROPERIDOL 2.5 MG/ML IJ SOLN: 0.6250 mg | INTRAMUSCULAR | Status: DC | PRN

## 2011-12-18 MED ORDER — SODIUM CHLORIDE 0.9 % IV SOLN
INTRAVENOUS | Status: DC
  Administered 2011-12-18: 75 mL/h via INTRAVENOUS

## 2011-12-18 MED ORDER — ONDANSETRON HCL 4 MG/2ML IJ SOLN: 4.0000 mg | Freq: Four times a day (QID) | INTRAMUSCULAR | Status: DC | PRN

## 2011-12-18 MED ORDER — GLYCOPYRROLATE 0.2 MG/ML IJ SOLN
INTRAMUSCULAR | Status: DC | PRN
  Administered 2011-12-18: 0.2 mg via INTRAVENOUS

## 2011-12-18 MED ORDER — SODIUM CHLORIDE 0.9 % IR SOLN
Status: DC | PRN
  Administered 2011-12-18: 10 mL

## 2011-12-18 MED ORDER — PROPOFOL 10 MG/ML IV EMUL
INTRAVENOUS | Status: DC | PRN
  Administered 2011-12-18: 40 mg via INTRAVENOUS
  Administered 2011-12-18: 110 mg via INTRAVENOUS

## 2011-12-18 MED ORDER — HYDROMORPHONE HCL PF 1 MG/ML IJ SOLN
INTRAMUSCULAR | Status: AC
  Filled 2011-12-18: qty 1

## 2011-12-18 MED ORDER — OXYCODONE HCL 5 MG PO TABS
5.0000 mg | ORAL_TABLET | ORAL | Status: DC | PRN
  Administered 2011-12-19 – 2011-12-20 (×2): 5 mg via ORAL
  Filled 2011-12-18 (×3): qty 1

## 2011-12-18 MED ORDER — PANTOPRAZOLE SODIUM 40 MG PO TBEC
40.0000 mg | DELAYED_RELEASE_TABLET | Freq: Every day | ORAL | Status: DC
  Administered 2011-12-19: 40 mg via ORAL
  Filled 2011-12-18: qty 1

## 2011-12-18 MED ORDER — LIDOCAINE HCL 1 % IJ SOLN
INTRAMUSCULAR | Status: DC | PRN
  Administered 2011-12-18: 50 mg via INTRADERMAL

## 2011-12-18 MED ORDER — SODIUM CHLORIDE 0.9 % IJ SOLN
INTRAMUSCULAR | Status: DC | PRN
  Administered 2011-12-18: 13:00:00 via INTRAMUSCULAR

## 2011-12-18 MED ORDER — MORPHINE SULFATE 2 MG/ML IJ SOLN
2.0000 mg | INTRAMUSCULAR | Status: DC | PRN
  Administered 2011-12-18 – 2011-12-19 (×3): 2 mg via INTRAVENOUS
  Filled 2011-12-18 (×4): qty 1

## 2011-12-18 MED ORDER — LACTATED RINGERS IV SOLN
INTRAVENOUS | Status: DC | PRN
  Administered 2011-12-18 (×2): via INTRAVENOUS

## 2011-12-18 MED ORDER — HYDROMORPHONE HCL PF 1 MG/ML IJ SOLN
0.2500 mg | INTRAMUSCULAR | Status: DC | PRN
  Administered 2011-12-18 (×2): 0.5 mg via INTRAVENOUS

## 2011-12-18 MED ORDER — FENTANYL CITRATE 0.05 MG/ML IJ SOLN
INTRAMUSCULAR | Status: AC
  Filled 2011-12-18: qty 2

## 2011-12-18 MED ORDER — CITALOPRAM HYDROBROMIDE 20 MG PO TABS
20.0000 mg | ORAL_TABLET | Freq: Every day | ORAL | Status: DC
  Administered 2011-12-18 – 2011-12-20 (×3): 20 mg via ORAL
  Filled 2011-12-18 (×3): qty 1

## 2011-12-18 MED ORDER — VALSARTAN-HYDROCHLOROTHIAZIDE 160-12.5 MG PO TABS: 2.0000 | ORAL_TABLET | Freq: Every morning | ORAL | Status: DC

## 2011-12-18 SURGICAL SUPPLY — 57 items
APPLIER CLIP 9.375 MED OPEN (MISCELLANEOUS) ×2
BENZOIN TINCTURE PRP APPL 2/3 (GAUZE/BANDAGES/DRESSINGS) IMPLANT
BINDER BREAST LRG (GAUZE/BANDAGES/DRESSINGS) IMPLANT
BINDER BREAST XLRG (GAUZE/BANDAGES/DRESSINGS) ×2 IMPLANT
CANISTER SUCTION 2500CC (MISCELLANEOUS) ×2 IMPLANT
CHLORAPREP W/TINT 26ML (MISCELLANEOUS) ×2 IMPLANT
CLIP APPLIE 9.375 MED OPEN (MISCELLANEOUS) ×1 IMPLANT
CLOTH BEACON ORANGE TIMEOUT ST (SAFETY) ×2 IMPLANT
CONT SPEC 4OZ CLIKSEAL STRL BL (MISCELLANEOUS) ×2 IMPLANT
COVER PROBE W GEL 5X96 (DRAPES) ×2 IMPLANT
COVER SURGICAL LIGHT HANDLE (MISCELLANEOUS) ×2 IMPLANT
DERMABOND ADVANCED (GAUZE/BANDAGES/DRESSINGS) ×2
DERMABOND ADVANCED .7 DNX12 (GAUZE/BANDAGES/DRESSINGS) ×2 IMPLANT
DRAIN CHANNEL 19F RND (DRAIN) IMPLANT
DRAPE LAPAROSCOPIC ABDOMINAL (DRAPES) ×2 IMPLANT
DRAPE PROXIMA HALF (DRAPES) ×2 IMPLANT
DRSG PAD ABDOMINAL 8X10 ST (GAUZE/BANDAGES/DRESSINGS) ×2 IMPLANT
ELECT BLADE 4.0 EZ CLEAN MEGAD (MISCELLANEOUS) ×2
ELECT CAUTERY BLADE 6.4 (BLADE) ×2 IMPLANT
ELECT REM PT RETURN 9FT ADLT (ELECTROSURGICAL) ×2
ELECTRODE BLDE 4.0 EZ CLN MEGD (MISCELLANEOUS) ×1 IMPLANT
ELECTRODE REM PT RTRN 9FT ADLT (ELECTROSURGICAL) ×1 IMPLANT
EVACUATOR SILICONE 100CC (DRAIN) IMPLANT
GLOVE BIO SURGEON STRL SZ 6.5 (GLOVE) ×2 IMPLANT
GLOVE BIO SURGEON STRL SZ7 (GLOVE) ×2 IMPLANT
GLOVE BIOGEL PI IND STRL 6.5 (GLOVE) ×2 IMPLANT
GLOVE BIOGEL PI IND STRL 7.5 (GLOVE) ×1 IMPLANT
GLOVE BIOGEL PI INDICATOR 6.5 (GLOVE) ×2
GLOVE BIOGEL PI INDICATOR 7.5 (GLOVE) ×1
GLOVE SURG SS PI 6.5 STRL IVOR (GLOVE) ×2 IMPLANT
GOWN STRL NON-REIN LRG LVL3 (GOWN DISPOSABLE) ×6 IMPLANT
KIT BASIN OR (CUSTOM PROCEDURE TRAY) ×2 IMPLANT
KIT ROOM TURNOVER OR (KITS) ×2 IMPLANT
NEEDLE 18GX1X1/2 (RX/OR ONLY) (NEEDLE) ×2 IMPLANT
NEEDLE HYPO 25GX1X1/2 BEV (NEEDLE) ×2 IMPLANT
NS IRRIG 1000ML POUR BTL (IV SOLUTION) ×2 IMPLANT
PACK GENERAL/GYN (CUSTOM PROCEDURE TRAY) ×2 IMPLANT
PAD ARMBOARD 7.5X6 YLW CONV (MISCELLANEOUS) ×2 IMPLANT
PEN SKIN MARKING BROAD (MISCELLANEOUS) IMPLANT
PIN SAFETY STERILE (MISCELLANEOUS) IMPLANT
SPECIMEN JAR LARGE (MISCELLANEOUS) ×2 IMPLANT
SPONGE GAUZE 4X4 12PLY (GAUZE/BANDAGES/DRESSINGS) IMPLANT
SPONGE LAP 18X18 X RAY DECT (DISPOSABLE) ×2 IMPLANT
STAPLER VISISTAT 35W (STAPLE) ×2 IMPLANT
STOCKINETTE IMPERVIOUS 9X36 MD (GAUZE/BANDAGES/DRESSINGS) IMPLANT
STRIP CLOSURE SKIN 1/2X4 (GAUZE/BANDAGES/DRESSINGS) ×4 IMPLANT
SUT ETHILON 2 0 FS 18 (SUTURE) ×4 IMPLANT
SUT MNCRL AB 4-0 PS2 18 (SUTURE) ×2 IMPLANT
SUT MON AB 4-0 PC3 18 (SUTURE) ×2 IMPLANT
SUT SILK 2 0 SH (SUTURE) ×2 IMPLANT
SUT VIC AB 3-0 54X BRD REEL (SUTURE) ×1 IMPLANT
SUT VIC AB 3-0 BRD 54 (SUTURE) ×1
SUT VIC AB 3-0 SH 8-18 (SUTURE) ×2 IMPLANT
SYR CONTROL 10ML LL (SYRINGE) ×2 IMPLANT
TOWEL OR 17X24 6PK STRL BLUE (TOWEL DISPOSABLE) ×2 IMPLANT
TOWEL OR 17X26 10 PK STRL BLUE (TOWEL DISPOSABLE) ×2 IMPLANT
WATER STERILE IRR 1000ML POUR (IV SOLUTION) IMPLANT

## 2011-12-18 NOTE — H&P (View-Only) (Signed)
Patient ID: Brandy [name], female   DOB: 1933-06-05, 78 y.o.   MRN: [id number]  Chief Complaint  Patient presents with  . Other    Eval new br ca    HPI Brandy [name] is a 78 y.o. female.  Referred by Dr. Jeralyn Ruths HPI This is a 76 yof who I know well from laparotomy for retroperitoneal mass that now appears to be chronic pancreatitis and is resolving.  We have followed this and it is continuing to get better on ct scan  Our last visit her appetite was better and she was beginning to have some energy.  She then palpated a left breast mass for about 2 weeks before presenting for mmg/us in mid May.  She complained of no pain or discharge.  Her mmg showed no abnormality on the right with a fatty -replaced breast.  The left side corresponding to the palpable abnormality there is a 1.8 cm hyperdense mass that is spiculated containing calcs.  There is another irregular linear distribution and a 5mm irregular nodule in the UOQ.  US shows at least a 1.8 cm mass without a correlate for the other mass.  US guided core with clip placement was performed of the larger mass. Stereo biopsy of the other was performed.  The first biopsy shows IDC with DCIS and LVI.  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  Prognostic panel is pending.  She comes in today with her family to discuss.  Past Medical History  Diagnosis Date  . Arthritis   . Asthma   . GERD (gastroesophageal reflux disease)   . Hyperlipidemia   . Hypertension     Past Surgical History  Procedure Date  . Breast biopsy 1998    left  . Total shoulder replacement 2011    left  . Eus 06/04/2011    Procedure: UPPER ENDOSCOPIC ULTRASOUND (EUS) LINEAR;  Surgeon: Rob Bunting, MD;  Location: [facility];  Service: Endoscopy;  Laterality: N/A;  . Exploratory laparotomy      biopsy of intra-abdominal mass    Family History  Problem Relation Age of Onset  . Cancer Father     prostate    Social  History History  Substance Use Topics  . Smoking status: Never Smoker   . Smokeless tobacco: Never Used  . Alcohol Use: No    No Known Allergies  Current Outpatient Prescriptions  Medication Sig Dispense Refill  . amLODipine (NORVASC) 10 MG tablet daily.      . citalopram (CELEXA) 10 MG tablet Take 10 mg by mouth daily.      Marland Kitchen DIOVAN HCT 160-12.5 MG per tablet daily.      Marland Kitchen LORazepam (ATIVAN) 0.5 MG tablet Take 0.5 mg by mouth every 8 (eight) hours.      . simvastatin (ZOCOR) 20 MG tablet daily.      . traMADol (ULTRAM) 50 MG tablet       . zolpidem (AMBIEN) 5 MG tablet Take 5 mg by mouth at bedtime as needed.        Review of Systems Review of Systems  Blood pressure 142/72, pulse 70, resp. rate 16, height 5\' 1"  (1.549 m), weight 168 lb (76.204 kg).  Physical Exam Physical Exam  Vitals reviewed. Constitutional: She appears well-developed and well-nourished.  Neck: Neck supple.  Cardiovascular: Normal rate, regular rhythm and normal heart sounds.   Pulmonary/Chest: Effort normal and breath sounds normal. She has no wheezes. She has no rales.  Right breast exhibits no inverted nipple, no mass, no nipple discharge, no skin change and no tenderness. Left breast exhibits mass. Left breast exhibits no inverted nipple, no nipple discharge, no skin change and no tenderness. Breasts are symmetrical.    Lymphadenopathy:    She has no cervical adenopathy.    She has no axillary adenopathy.       Right: No supraclavicular adenopathy present.       Left: No supraclavicular adenopathy present.    Data Reviewed Mmg, [place] and path reviewed  Assessment    Left breast cancer, likely stage I    Plan    Left mastectomy, possible snbx   We discussed the staging and pathophysiology of breast cancer. We discussed all of the different options for treatment for breast cancer including surgery, chemotherapy, radiation therapy, Herceptin, and antiestrogen therapy.   We discussed a sentinel  lymph node biopsy as she does not appear to having lymph node involvement right now. I did tell her I would discuss with other disciplines if we need to do a sentinel node and I think we will also need to wait for the prognostic panel. We discussed the performance of that with injection of radioactive tracer and blue dye. We discussed that she would have an incision underneath her axillary hairline. We discussed that there is a bout a 10-20% chance of having a positive node with a sentinel lymph node biopsy and we will await the permanent pathology to make any other first further decisions in terms of her treatment. One of these options might be to return to the operating room to perform an axillary lymph node dissection. We discussed about a 1-2% risk lifetime of chronic shoulder pain as well as lymphedema associated with a sentinel lymph node biopsy.  We discussed the options for treatment of the breast cancer which included lumpectomy versus a mastectomy. We discussed the performance of the lumpectomy with a wire placement. We discussed a 10-20% chance of a positive margin requiring reexcision in the operating room. We also discussed that she may need radiation therapy or antiestrogen therapy or both if she undergoes lumpectomy. I am not entirely sure right now she could undergo BCT and would be reasonable to get an MR to evaluated these lesions. We discussed the mastectomy and the postoperative care for that as well. We discussed that there is no difference in her survival whether she undergoes lumpectomy with radiation therapy or antiestrogen therapy versus a mastectomy. There is a slight difference in the local recurrence rate being 3-5% with lumpectomy and about 1% with a mastectomy.We discussed plastic surgery referral if she chooses mastectomy. We discussed the risks of operation including bleeding, infection, possible reoperation, wound breakdown, flap death. She understands her further therapy will be  based on what her stages at the time of her operation.  She does not want to preserve her breast after this discussion.  Accordingly I will cancer her mri and we will schedule for mastectomy       [facility] 12/01/2011, 9:19 PM

## 2011-12-18 NOTE — Discharge Instructions (Signed)
[facility] °[phone] ° °MASTECTOMY: POST OP INSTRUCTIONS ° °Always review your discharge instruction sheet given to you by the facility where your surgery was performed. °IF YOU HAVE DISABILITY OR FAMILY LEAVE FORMS, YOU MUST BRING THEM TO THE OFFICE FOR PROCESSING.   °DO NOT GIVE THEM TO YOUR DOCTOR. °A prescription for pain medication may be given to you upon discharge.  Take your pain medication as prescribed, if needed.  If narcotic pain medicine is not needed, then you may take acetaminophen (Tylenol), naprosyn (Alleve) or ibuprofen (Advil) as needed. °1. Take your usually prescribed medications unless otherwise directed. °2. If you need a refill on your pain medication, please contact your pharmacy.  They will contact our office to request authorization.  Prescriptions will not be filled after 5pm or on week-ends. °3. You should follow a light diet the first few days after arrival home, such as soup and crackers, etc.  Resume your normal diet the day after surgery. °4. Most patients will experience some swelling and bruising on the chest and underarm.  Ice packs will help.  Swelling and bruising can take several days to resolve. Wear the binder day and night until you return to the office.  °5. It is common to experience some constipation if taking pain medication after surgery.  Increasing fluid intake and taking a stool softener (such as Colace) will usually help or prevent this problem from occurring.  A mild laxative (Milk of Magnesia or Miralax) should be taken according to package instructions if there are no bowel movements after 48 hours. °6. Unless discharge instructions indicate otherwise, leave your bandage dry and in place until your next appointment in 3-5 days.  You may take a limited sponge bath.  No tube baths or showers until the drains are removed.  You may have steri-strips (small skin tapes) in place directly over the incision.  These strips should be left on the  skin for 7-10 days. If you have glue it will come off in next couple week.  Any sutures will be removed at an office visit °7. DRAINS:  If you have drains in place, it is important to keep a list of the amount of drainage produced each day in your drains.  Before leaving the hospital, you should be instructed on drain care.  Call our office if you have any questions about your drains. I will remove your drains when they put out less than 30 cc or ml for 2 consecutive days. °8. ACTIVITIES:  You may resume regular (light) daily activities beginning the next day--such as daily self-care, walking, climbing stairs--gradually increasing activities as tolerated.  You may have sexual intercourse when it is comfortable.  Refrain from any heavy lifting or straining until approved by your doctor. °a. You may drive when you are no longer taking prescription pain medication, you can comfortably wear a seatbelt, and you can safely maneuver your car and apply brakes. °b. RETURN TO WORK:  __________________________________________________________ °9. You should see your doctor in the office for a follow-up appointment approximately 3-5 days after your surgery.  Your doctor’s nurse will typically make your follow-up appointment when she calls you with your pathology report.  Expect your pathology report 3-4business days after surgery. °10. OTHER INSTRUCTIONS: ______________________________________________________________________________________________ ____________________________________________________________________________________________ °WHEN TO CALL YOUR DR Carole Doner: °1. Fever over 101.0 °2. Nausea and/or vomiting °3. Extreme swelling or bruising °4. Continued bleeding from incision. °5. Increased pain, redness, or drainage from the incision. °The clinic staff is available   to answer your questions during regular business hours.  Please don’t hesitate to call and ask to speak to one of the nurses for clinical concerns.  If  you have a medical emergency, go to the nearest emergency room or call 911.  A surgeon from [facility] is always on call at the hospital. °[address], [address], [place], [place]  [address] ? [address] [address], [place],    [address] °[phone] ? [phone] ? FAX [phone] °Web site: [email or web address] ° °

## 2011-12-18 NOTE — Anesthesia Preprocedure Evaluation (Addendum)
Anesthesia Evaluation  Patient identified by MRN, date of birth, ID band Patient awake    Reviewed: Allergy & Precautions, H&P , NPO status , Patient's Chart, lab work & pertinent test results, reviewed documented beta blocker date and time   History of Anesthesia Complications Negative for: history of anesthetic complications  Airway Mallampati: I TM Distance: >3 FB Neck ROM: Full    Dental  (+) Edentulous Upper and Edentulous Lower   Pulmonary asthma ,  breath sounds clear to auscultation  Pulmonary exam normal       Cardiovascular hypertension, Pt. on medications Rhythm:Regular Rate:Normal     Neuro/Psych PSYCHIATRIC DISORDERS Depression negative neurological ROS     GI/Hepatic Neg liver ROS, hiatal hernia, GERD-  Medicated and Controlled,  Endo/Other  Morbid obesity  Renal/GU negative Renal ROS     Musculoskeletal  (+) Arthritis -, Osteoarthritis,    Abdominal (+) + obese,   Peds  Hematology   Anesthesia Other Findings   Reproductive/Obstetrics                         Anesthesia Physical Anesthesia Plan  ASA: III  Anesthesia Plan: General   Post-op Pain Management:    Induction: Intravenous  Airway Management Planned: LMA  Additional Equipment:   Intra-op Plan:   Post-operative Plan: Extubation in OR  Informed Consent: I have reviewed the patients History and Physical, chart, labs and discussed the procedure including the risks, benefits and alternatives for the proposed anesthesia with the patient or authorized representative who has indicated his/her understanding and acceptance.   Dental advisory given  Plan Discussed with: CRNA, Anesthesiologist and Surgeon  Anesthesia Plan Comments:         Anesthesia Quick Evaluation

## 2011-12-18 NOTE — Interval H&P Note (Signed)
History and Physical Interval Note:  12/18/2011 12:45 PM  Brandy [name]  has presented today for surgery, with the diagnosis of left breast cancer  The various methods of treatment have been discussed with the patient and family. After consideration of risks, benefits and other options for treatment, the patient has consented to  Procedure(s) (LRB): MASTECTOMY WITH SENTINEL LYMPH NODE BIOPSY (Left) as a surgical intervention .  The patients' history has been reviewed, patient examined, no change in status, stable for surgery.  I have reviewed the patients' chart and labs.  Questions were answered to the patient's satisfaction.     [name]

## 2011-12-18 NOTE — Preoperative (Signed)
Beta Blockers   Reason not to administer Beta Blockers:Not Applicable 

## 2011-12-18 NOTE — Transfer of Care (Signed)
Immediate Anesthesia Transfer of Care Note  Patient: Brandy [name]  Procedure(s) Performed: Procedure(s) (LRB): MASTECTOMY WITH SENTINEL LYMPH NODE BIOPSY (Left)  Patient Location: PACU  Anesthesia Type: General  Level of Consciousness: awake, alert , oriented and patient cooperative  Airway & Oxygen Therapy: Patient Spontanous Breathing and Patient connected to nasal cannula oxygen  Post-op Assessment: Report given to PACU RN and Post -op Vital signs reviewed and stable  Post vital signs: Reviewed and stable  Complications: No apparent anesthesia complications

## 2011-12-18 NOTE — Op Note (Signed)
Preoperative diagnosis: Multicentric left breast cancer, clinical stage I Postoperative diagnosis: Same as above Procedure: Left sm, injection blue dye,left axillary sentinel node biopsy Surgeon: Dr. Harden Mo Estimated blood loss: 50 cc Specimens: Left breast mark short stitch inferior, long stitch lateral Left axillary node x1, sentinel  Drains: 2 19 French Blake drains to mastectomy space Complications: None Sponge needle count correct x2 at operation Disposition to recovery in stable condition  Indications: This is a 78 yo female who is well known to me who presents with a multicentric left breast cancer on biopsy. We discussed all of her different options and we've elected to proceed with a mastectomy and sentinel node biopsy. Reconstruction was discussed with her but she did not want to pursue this.  Procedure: After informed consent was obtained the patient was taken to the operating room. She been first been injected with technetium in a standard periareolar fashion. She had sequential compression devices placed her legs. She was then placed under general anesthesia without complication. Her left breast and axilla were then prepped and draped in the standard sterile surgical fashion. A surgical timeout was then performed.  I injected a cc of a methylene blue saline mixture and all 4 of the cardinal positions around the areola. This was then massaged for a minute. I then made a large elliptical incision that encompassed the nipple and areolar complex. One of her tumors was palpable and I went all the way around this. I then created flaps to the sternum, clavicle, inframammary crease, and latissimus laterally. The breast was then removed from the pectoralis muscle which was very thin. This included the pectoralis fascia. I then divided this in the axilla. I used a neoprobe to identify one area that had a sentinel lymph node. The counts were about 125. Background counts were 0 after  removal. This node was also blue. These were passed off the table as a specimen. I then took some time and obtain hemostasis. I then placed 2 19 French Blake drains in the space. These were secured with 2-0 nylon suture. I then reevaluated this again and hemostasis was observed. Irrigation was performed. I then elected to remove some more skin laterally as she had a lot of excess skin. Eventually I then closes with 3-0 Vicryl and 4-0 Monocryl. Dermabond and Steri-Strips were applied. A dressing was applied. Her drains were functional pouch completion. She tolerated this well was extubated and transferred to recovery stable.

## 2011-12-18 NOTE — Anesthesia Postprocedure Evaluation (Signed)
  Anesthesia Post-op Note  Patient: Brandy [name]  Procedure(s) Performed: Procedure(s) (LRB): MASTECTOMY WITH SENTINEL LYMPH NODE BIOPSY (Left)  Patient Location: PACU  Anesthesia Type: General  Level of Consciousness: awake and alert   Airway and Oxygen Therapy: Patient Spontanous Breathing  Post-op Pain: mild  Post-op Assessment: Post-op Vital signs reviewed, Patient's Cardiovascular Status Stable, Respiratory Function Stable, Patent Airway, No signs of Nausea or vomiting and Pain level controlled  Post-op Vital Signs: stable  Complications: No apparent anesthesia complications

## 2011-12-18 NOTE — Anesthesia Procedure Notes (Signed)
Procedure Name: LMA Insertion Date/Time: 12/18/2011 1:04 PM Performed by: Leona Singleton A Pre-anesthesia Checklist: Patient identified Patient Re-evaluated:Patient Re-evaluated prior to inductionOxygen Delivery Method: Circle system utilized Preoxygenation: Pre-oxygenation with 100% oxygen Intubation Type: IV induction Ventilation: Mask ventilation without difficulty LMA: LMA inserted LMA Size: 3.0 Tube type: Oral Number of attempts: 1 Placement Confirmation: positive ETCO2 and breath sounds checked- equal and bilateral Tube secured with: Tape Dental Injury: Teeth and Oropharynx as per pre-operative assessment

## 2011-12-19 NOTE — Progress Notes (Signed)
1 Day Post-Op  Subjective: Doing well this am, no complaints except pain at surgical site  Objective: Vital signs in last 24 hours: Temp:  [97.1 F (36.2 C)-99.5 F (37.5 C)] 97.6 F (36.4 C) (06/15 0521) Pulse Rate:  [57-77] 67  (06/15 0521) Resp:  [16-18] 18  (06/15 0521) BP: (114-157)/(38-77) 114/45 mmHg (06/15 0521) SpO2:  [92 %-100 %] 92 % (06/15 0521) Weight:  [165 lb 11.2 oz (75.161 kg)] 165 lb 11.2 oz (75.161 kg) (06/14 1626) Last BM Date: 12/17/11  Intake/Output from previous day: 06/14 0701 - 06/15 0700 In: 1350 [P.O.:50; I.V.:1300] Out: 615 [Urine:400; Drains:115; Blood:100] Intake/Output this shift: Total I/O In: -  Out: 515 [Urine:400; Drains:115]  General appearance: no distress Resp: clear to auscultation bilaterally Cardio: regular rate and rhythm, S1, S2 normal, no murmur, click, rub or gallop Incision/Wound:flaps viable, drains serosang a little bloody this am    Assessment/Plan: POD 1 left mastectomy/sn  Will advance diet, saline lock iv, oob today Likely home tomorrow Will monitor drains   LOS: 1 day    [facility] 12/19/2011

## 2011-12-20 LAB — CBC
Hemoglobin: 9.4 g/dL — ABNORMAL LOW (ref 12.0–15.0)
MCHC: 32.6 g/dL (ref 30.0–36.0)
RBC: 3.4 MIL/uL — ABNORMAL LOW (ref 3.87–5.11)
RDW: 14.7 % (ref 11.5–15.5)
WBC: 11.4 10*3/uL — ABNORMAL HIGH (ref 4.0–10.5)

## 2011-12-20 LAB — BASIC METABOLIC PANEL
BUN: 14 mg/dL (ref 6–23)
Calcium: 8.7 mg/dL (ref 8.4–10.5)
Creatinine, Ser: 0.7 mg/dL (ref 0.50–1.10)
GFR calc Af Amer: >90 mL/min (ref >90)
GFR calc non Af Amer: 82 mL/min — ABNORMAL LOW (ref >90)
Potassium: 3.3 mEq/L — ABNORMAL LOW (ref 3.5–5.1)

## 2011-12-20 MED ORDER — OXYCODONE HCL 5 MG PO TABS: 5.0000 mg | ORAL_TABLET | ORAL | Status: AC | PRN

## 2011-12-20 NOTE — Discharge Summary (Signed)
Physician Discharge Summary  Patient ID: EVANTHIA MAUND MRN: [id number] DOB/AGE: June 05, 1933 78 y.o.  Admit date: 12/18/2011 Discharge date: 12/20/2011  Admission Diagnoses: Left breast cancer Discharge Diagnoses:  Active Problems:  * No active hospital problems. *    Discharged Condition: good  Hospital Course: 11 yof who had a multicentric left breast cancer admitted and underwent left mastectomy with left axillary sentinel node biopsy.  She has done well postop with expected drain output and will be discharged home.  Consults: None  Significant Diagnostic Studies: none   Treatments: surgery: left simple mastectomy and left axillary sentinel node biopsy   Disposition: 01-Home or Self Care  Discharge Orders    Future Appointments: Provider: Department: Dept Phone: Center:   12/22/2011 1:40 PM Emelia Loron, MD [facility] [phone] None   12/28/2011 12:00 PM Krista Blue [facility] [phone] None   12/28/2011 12:30 PM Victorino December, MD [facility] [phone] None   08/19/2012 2:00 PM Krista Blue [facility] [phone] None   08/19/2012 2:30 PM Victorino December, MD [facility] [phone] None     Medication List  As of 12/20/2011  3:57 PM   TAKE these medications         amLODipine 10 MG tablet   Commonly known as: NORVASC   Take 10 mg by mouth daily.      citalopram 20 MG tablet   Commonly known as: CELEXA   Take 20 mg by mouth daily.      DIOVAN HCT 160-12.5 MG per tablet   Generic drug: valsartan-hydrochlorothiazide   Take 2 tablets by mouth every morning.      omeprazole 20 MG capsule   Commonly known as: PRILOSEC   Take 20 mg by mouth daily.      oxyCODONE 5 MG immediate release tablet   Commonly known as: Oxy IR/ROXICODONE   Take 1 tablet (5 mg total) by mouth every 4 (four) hours as needed.      simvastatin 20 MG tablet   Commonly known as: ZOCOR   Take 20 mg by mouth every 3 (three) days.      zolpidem 10 MG tablet   Commonly known as: AMBIEN   Take 10 mg by mouth at bedtime.           Follow-up Information    Follow up with Emelia Loron, MD on 12/22/2011. (appt is at 1:40 on Tuesday)    Contact information:   [facility] [address] [place] [address] [phone]          Signed: Emelia Loron 12/20/2011, 3:57 PM

## 2011-12-20 NOTE — Progress Notes (Signed)
2 Days Post-Op  Subjective: Doing well wants to go home  Objective: Vital signs in last 24 hours: Temp:  [98.3 F (36.8 C)-100.2 F (37.9 C)] 98.3 F (36.8 C) (06/16 0523) Pulse Rate:  [65-77] 70  (06/16 0523) Resp:  [18] 18  (06/16 0523) BP: (110-134)/(40-58) 113/42 mmHg (06/16 0523) SpO2:  [90 %-95 %] 90 % (06/16 0523) Last BM Date: 12/17/11  Intake/Output from previous day: 06/15 0701 - 06/16 0700 In: 720 [P.O.:720] Out: 1007 [Urine:850; Drains:157] Intake/Output this shift:    General appearance: no distress Chest wall: no tenderness, flaps viable, drains with expected output  Lab Results:   Basename 12/20/11 0500  WBC 11.4*  HGB 9.4*  HCT 28.8*  PLT 199   BMET  Basename 12/20/11 0500  NA 135  K 3.3*  CL 100  CO2 24  GLUCOSE 128*  BUN 14  CREATININE 0.70  CALCIUM 8.7    Assessment/Plan: POD 2 left sm/snbx  Dc home today   LOS: 2 days    [facility] 12/20/2011

## 2011-12-21 ENCOUNTER — Encounter (HOSPITAL_COMMUNITY): Payer: Self-pay | Admitting: General Surgery

## 2011-12-22 ENCOUNTER — Encounter: Payer: Self-pay | Admitting: *Deleted

## 2011-12-22 ENCOUNTER — Encounter (INDEPENDENT_AMBULATORY_CARE_PROVIDER_SITE_OTHER): Payer: Self-pay | Admitting: General Surgery

## 2011-12-22 ENCOUNTER — Telehealth (INDEPENDENT_AMBULATORY_CARE_PROVIDER_SITE_OTHER): Payer: Self-pay

## 2011-12-22 ENCOUNTER — Ambulatory Visit (INDEPENDENT_AMBULATORY_CARE_PROVIDER_SITE_OTHER): Payer: Medicare Other | Admitting: General Surgery

## 2011-12-22 VITALS — BP 110/40 | HR 77 | Temp 98.2°F | Ht 60.0 in | Wt 170.6 lb

## 2011-12-22 DIAGNOSIS — Z09 Encounter for follow-up examination after completed treatment for conditions other than malignant neoplasm: Secondary | ICD-10-CM

## 2011-12-22 NOTE — Progress Notes (Signed)
Subjective:     Patient ID: Brandy [name], female   DOB: 1933-01-03, 78 y.o.   MRN: [id number]  HPI 67 yof Who underwent a left simple mastectomy sentinel lymph node biopsy last week. She was discharged home postoperative day 2 and returns today in followup. She is a little bit fatigued but is otherwise doing well. Her drains are still putting out to much to take out. Her pathology returned as a T1CN1A. Breast cancer. She had a 1.7 and 0.7 cm tumor with clear margins after a mastectomy. She had one sentinel lymph node that has metastatic cancer in it. She is hormone receptor positive at 100% for both progesterone and estrogen.  Review of Systems     Objective:   Physical Exam Left sided mastectomy incision clean without infection, flaps viable, drains with serous fluid    Assessment:     S/p mastectomy/snbx    Plan:     She is doing well I believe both of her drains in for now. I will plan on seeing her early next week or sooner for drains a ready to come out. I gave her some exercises for her shoulder.  We discussed her pathology and the options at this point. A standard operation would be to proceed with an axillary dissection on this side. I don't think due to her comorbidities as well as her current state that is the best plan for her they'll. I think we should at least consider other therapies. I discussed with her the options of #1 antiestrogen therapy alone #2 antiestrogen therapy and radiation therapy #3 antiestrogen therapy combined with an axillary node dissection.  The standard right now would be to proceed with an axillary lymph node dissection I just don't know she's going to be able to really tolerate all this given the fact that she's had a shoulder replaced as well as her current overall medical state. I don't really think she likely need chemotherapy believe that decision a medical oncology when she sees them. She could be evaluated by both medical and radiation oncology  and will plan on seeing our plan and a multidisciplinary fashion.

## 2011-12-22 NOTE — Telephone Encounter (Signed)
LM w/husband to get the daughter to call me back b/c I have made an appt for the pt to see Radiation Oncology for tomorrow. The husband can't remember the cell # b/c it's on speed dial so he will try to call the wife to relay the message.

## 2011-12-23 ENCOUNTER — Encounter: Payer: Self-pay | Admitting: Radiation Oncology

## 2011-12-23 ENCOUNTER — Ambulatory Visit
Admission: RE | Admit: 2011-12-23 | Discharge: 2011-12-23 | Disposition: A | Discharge status: Home / Self Care | Payer: Medicare Other | Source: Ambulatory Visit | Attending: Radiation Oncology | Admitting: Radiation Oncology

## 2011-12-23 VITALS — BP 107/65 | HR 61 | Temp 97.8°F | Resp 20 | Wt 171.6 lb

## 2011-12-23 DIAGNOSIS — C50419 Malignant neoplasm of upper-outer quadrant of unspecified female breast: Secondary | ICD-10-CM

## 2011-12-23 DIAGNOSIS — E785 Hyperlipidemia, unspecified: Secondary | ICD-10-CM | POA: Insufficient documentation

## 2011-12-23 DIAGNOSIS — I10 Essential (primary) hypertension: Secondary | ICD-10-CM | POA: Insufficient documentation

## 2011-12-23 DIAGNOSIS — Z79899 Other long term (current) drug therapy: Secondary | ICD-10-CM | POA: Insufficient documentation

## 2011-12-23 DIAGNOSIS — K219 Gastro-esophageal reflux disease without esophagitis: Secondary | ICD-10-CM | POA: Insufficient documentation

## 2011-12-23 NOTE — Progress Notes (Signed)
Please see the Nurse Progress Note in the MD Initial Consult Encounter for this patient. 

## 2011-12-23 NOTE — Progress Notes (Signed)
Path:12/18/11: left breast simple mastectomy=metastatic ca in (1/1) lymph node with focal capsular extension, invasive ductal carcinoma,2 foci well  Diff, spanning 1.7 and 0.7cm, DCIS,lymphovascular is idrntified 1st Dx 5/20/13ER?PR=positive  Patient,alert,oriented x3,Married, 3 children, pat has 2 jp drains left chest wall with steri strips intact, jp drains bothe with red fluid drainage,pain level a 5 at present,saw Dr.Wakefield yesterday, Allergies:NKDA

## 2011-12-23 NOTE — Progress Notes (Signed)
[facility] Radiation Oncology NEW PATIENT EVALUATION  Name: Brandy [name] MRN: [id number]  Date:   12/23/2011           DOB: 10-Sep-1932  Status: outpatient   CC: [name], MD  Emelia Loron, MD ,  Drue Second, M.D.   REFERRING PHYSICIAN: Emelia Loron, MD   DIAGNOSIS: The encounter diagnosis was Cancer of upper-outer quadrant of female breast.    HISTORY OF PRESENT ILLNESS:  Brandy [name] is a 78 y.o. female who is seen today for a new consultation visit. She has had significant problems over the last year and underwent a laparotomy for a retroperitoneal mass that appears to represent chronic pancreatitis. No finding of malignancy and she has been recovering from this. The patient indicates that she felt a mass within the left breast and she proceeded to undergo a mammogram for workup of this. In the area of concern there was a 1.8 cm hyperdense mass which was spiculated and contained microcalcifications. There is also a secondary finding at the 2:00 position in the upper outer quadrant which was felt to possibly represent an intramammary lymph node or satellite nodule. Axillary lymph nodes were visualized and appeared to be fatty replaced. The ultrasound findings corroborated the mammogram findings with a mass appearing in the upper outer quadrant with an irregular hypoechoic mass area at there is not a concordant finding in the superior upper outer quadrant on ultrasound. A needle core biopsy of both masses were completed. The superior mass demonstrated an invasive ductal carcinoma with associated DCIS and lymphovascular space invasion. The remote lateral breast mass returned positive for a high-grade ductal carcinoma in situ with comedo necrosis and calcification.   The patient proceeded to undergo a left simple breast mastectomy with sentinel lymph node biopsy. 2 foci of invasive ductal carcinoma were found, both were well-differentiated. They measured 1.7  cm and 0.7 cm. There was associated DCIS and lymphovascular space invasion. The margins were all negative. The single sentinel lymph node did demonstrate carcinoma with focal capsular extension. Septra studies have indicated that the largest tumor is ER positive and PR positive as well as HER-2/neu negative. The smaller tumor was ER positive and PR positive as well as HER-2/neu positive. The patient's pathologic stage therefore was multifocal T1 C. N1 A. disease. The patient has done relatively well postoperatively. She does still have drains in place. She presents today for a discussion of possible postoperative radiotherapy and she is also scheduled to see Dr. Park Breed next week with regards to systemic treatment.  PREVIOUS RADIATION THERAPY: No   PAST MEDICAL HISTORY:  has a past medical history of Asthma; Hyperlipidemia; Hypertension; Cancer; Bronchitis; Arthritis; Dysphagia; H/O hiatal hernia; GERD (gastroesophageal reflux disease); Urinary urgency; History of kidney stones; Depression; Insomnia; Breast cancer (12/18/11); and Breast CA (11/23/11).     PAST SURGICAL HISTORY:  Past Surgical History  Procedure Date  . Breast biopsy 1998    left  . Total shoulder replacement 2011    left  . Eus 06/04/2011    Procedure: UPPER ENDOSCOPIC ULTRASOUND (EUS) LINEAR;  Surgeon: Rob Bunting, MD;  Location: [facility] ENDOSCOPY;  Service: Endoscopy;  Laterality: N/A;  . Exploratory laparotomy      biopsy of intra-abdominal mass  . Bladder tack   . Tubal ligation   . Appendectomy   . Dilation and curettage of uterus   . Esophagogastroduodenoscopy   . Mastectomy w/ sentinel node biopsy 12/18/2011    Procedure: MASTECTOMY WITH SENTINEL LYMPH NODE  BIOPSY;  Surgeon: Emelia Loron, MD;  [facility]: [facility] OR;  Service: General;  Laterality: Left;  . Jp drain 12/18/11    2 drains still in from left breast masectomy     FAMILY HISTORY: family history includes Cancer in her father and other.   SOCIAL HISTORY:   reports that she has never smoked. She has never used smokeless tobacco. She reports that she does not drink alcohol or use illicit drugs.   ALLERGIES: Review of patient's allergies indicates no known allergies.   MEDICATIONS:  Current Outpatient Prescriptions  Medication Sig Dispense Refill  . amLODipine (NORVASC) 10 MG tablet Take 10 mg by mouth daily.       . citalopram (CELEXA) 20 MG tablet Take 20 mg by mouth daily.      Marland Kitchen DIOVAN HCT 160-12.5 MG per tablet Take 2 tablets by mouth every morning.       Marland Kitchen omeprazole (PRILOSEC) 20 MG capsule Take 20 mg by mouth daily.      Marland Kitchen oxyCODONE (OXY IR/ROXICODONE) 5 MG immediate release tablet Take 1 tablet (5 mg total) by mouth every 4 (four) hours as needed.  20 tablet  0  . simvastatin (ZOCOR) 20 MG tablet Take 20 mg by mouth daily.       Marland Kitchen zolpidem (AMBIEN) 10 MG tablet Take 10 mg by mouth at bedtime.         REVIEW OF SYSTEMS:  Pertinent items are noted in HPI.    PHYSICAL EXAM:  weight is 171 lb 9.6 oz (77.837 kg). Her oral temperature is 97.8 F (36.6 C). Her blood pressure is 107/65 and her pulse is 61. Her respiration is 20.   General: Well-developed, in no acute distress HEENT: Normocephalic, atraumatic; oral cavity clear Neck: Supple without any lymphadenopathy Cardiovascular: Regular rate and rhythm Respiratory: Clear to auscultation bilaterally Breasts/ chest wall: Left mastectomy incision site looks fine today with JP drains in place. She is early from her surgery but appears to be healing well with no sign of infection. GI: Soft, nontender, normal bowel sounds Extremities: No edema present Neuro: No focal deficits    LABORATORY DATA:  Lab Results  Component Value Date   WBC 11.4* 12/20/2011   HGB 9.4* 12/20/2011   HCT 28.8* 12/20/2011   MCV 84.7 12/20/2011   PLT 199 12/20/2011   Lab Results  Component Value Date   NA 135 12/20/2011   K 3.3* 12/20/2011   CL 100 12/20/2011   CO2 24 12/20/2011   Lab Results  Component  Value Date   ALT 20 08/20/2011   AST 24 08/20/2011   ALKPHOS 84 08/20/2011   BILITOT 0.3 08/20/2011      IMPRESSION: Pleasant 78 year old female who is status post a left-sided simple mastectomy for a multifocal T1 C. N1 a tumor. Her single sentinel lymph node did show carcinoma with focal extracapsular extension.   PLAN: I believe that postmastectomy radiation would be very reasonable for her case. She also is seeing Dr. Park Breed next week for consideration of systemic treatment. Dr. Dwain Sarna has had a chance to review her final pathology. He feels that an axillary lymph node dissection, proceeding with this, would be a standard recommendation although for her case he is not sure that this would be well tolerated. The patient does have some comorbidities and is also had left shoulder surgery on this side. With multifocal disease and at least one positive node, I believe that postmastectomy radiation would be a reasonable consideration if she is  able to tolerate this regardless of other treatment. If the patient proceeds with radiation to both the chest wall and regional lymph nodes, and potentially systemic treatment, then this may be sufficient for her case in terms of local regional disease. If she proceeds with further staging studies and suspicious findings are seen in terms of lymph nodes in this also could be a reason to reevaluate this. My feeling at this time is that proceeding with radiation in the absence of an axillary lymph node dissection would be reasonable for her case given the potential downside and potential benefits of the alternatives.  I discussed a possible 6-1/2 week course of treatment with her. She understands that she would need to heal up some more in any case and if she proceeds with chemotherapy then the radiation would be given after this. All of her questions were answered and we did discuss the possible side effects and risks of radiation treatment. Her case is going to be  presented I believe at breast conference next week and we can discuss her case further in a multidose Murray fashion at that time as well to recheck in sentences.   I spent 60 minutes minutes face to face with the patient and more than 50% of that time was spent in counseling and/or coordination of care.

## 2011-12-24 ENCOUNTER — Telehealth (INDEPENDENT_AMBULATORY_CARE_PROVIDER_SITE_OTHER): Payer: Self-pay | Admitting: General Surgery

## 2011-12-24 NOTE — Telephone Encounter (Signed)
Pt calling for refill of pain meds.  Understands we are calling in Hydrocodone 5/325 mg,  #30, 1-2 po Q 4-6 H prn pain, no refill.  Called to [facility]:  [phone].

## 2011-12-28 ENCOUNTER — Other Ambulatory Visit (HOSPITAL_BASED_OUTPATIENT_CLINIC_OR_DEPARTMENT_OTHER): Payer: Medicare Other | Admitting: Lab

## 2011-12-28 ENCOUNTER — Encounter: Payer: Self-pay | Admitting: Oncology

## 2011-12-28 ENCOUNTER — Ambulatory Visit (HOSPITAL_BASED_OUTPATIENT_CLINIC_OR_DEPARTMENT_OTHER): Payer: Medicare Other | Admitting: Oncology

## 2011-12-28 VITALS — BP 136/69 | HR 80 | Temp 98.5°F | Ht 60.0 in | Wt 169.5 lb

## 2011-12-28 DIAGNOSIS — C50919 Malignant neoplasm of unspecified site of unspecified female breast: Secondary | ICD-10-CM

## 2011-12-28 DIAGNOSIS — C773 Secondary and unspecified malignant neoplasm of axilla and upper limb lymph nodes: Secondary | ICD-10-CM

## 2011-12-28 DIAGNOSIS — R1900 Intra-abdominal and pelvic swelling, mass and lump, unspecified site: Secondary | ICD-10-CM

## 2011-12-28 DIAGNOSIS — C50419 Malignant neoplasm of upper-outer quadrant of unspecified female breast: Secondary | ICD-10-CM

## 2011-12-28 LAB — COMPREHENSIVE METABOLIC PANEL
ALT: 13 U/L (ref 0–35)
AST: 15 U/L (ref 0–37)
Albumin: 3.5 g/dL (ref 3.5–5.2)
Alkaline Phosphatase: 67 U/L (ref 39–117)
Calcium: 8.8 mg/dL (ref 8.4–10.5)
Chloride: 104 mEq/L (ref 96–112)
Creatinine, Ser: 0.61 mg/dL (ref 0.50–1.10)
Potassium: 4 mEq/L (ref 3.5–5.3)

## 2011-12-28 LAB — CBC WITH DIFFERENTIAL/PLATELET
BASO%: 0.7 % (ref 0.0–2.0)
EOS%: 9.6 % — ABNORMAL HIGH (ref 0.0–7.0)
MCH: 27.9 pg (ref 25.1–34.0)
MCHC: 32.9 g/dL (ref 31.5–36.0)
MCV: 85 fL (ref 79.5–101.0)
MONO%: 5.9 % (ref 0.0–14.0)
RDW: 14.4 % (ref 11.2–14.5)
lymph#: 2.7 10*3/uL (ref 0.9–3.3)

## 2011-12-28 MED ORDER — PROCHLORPERAZINE MALEATE 10 MG PO TABS: 10.0000 mg | ORAL_TABLET | Freq: Four times a day (QID) | ORAL | Status: DC | PRN

## 2011-12-28 MED ORDER — PROCHLORPERAZINE 25 MG RE SUPP: 25.0000 mg | Freq: Two times a day (BID) | RECTAL | Status: DC | PRN

## 2011-12-28 MED ORDER — LORAZEPAM 0.5 MG PO TABS: 0.5000 mg | ORAL_TABLET | Freq: Four times a day (QID) | ORAL | Status: AC | PRN

## 2011-12-28 MED ORDER — ONDANSETRON HCL 8 MG PO TABS: ORAL_TABLET | ORAL | Status: DC

## 2011-12-28 MED ORDER — DEXAMETHASONE 4 MG PO TABS: ORAL_TABLET | ORAL | Status: DC

## 2011-12-28 NOTE — Progress Notes (Signed)
OFFICE PROGRESS NOTE  CC  Brandy Labella, MD [address] [place] [address] Dr. Emelia Loron Dr. Antony Blackbird  DIAGNOSIS:  78 year old female with new diagnosis of multifocal breast cancer of the left breast. Patient is status post mastectomy with sentinel lymph node biopsy performed on 12/18/2011.   PRIOR THERAPY: 1.Patient has had multiple biopsies performed including endoscopic ultrasound guided biopsies as well as an open biopsy without a definitive diagnosis  #2 patient now with new diagnosis of multifocal breast cancer of the left breast. She underwent a mammogram after she felt a mass in the left breast. The mammogram showed an abnormality. As a biopsy was performed and she was found to have an ace invasive ductal carcinoma. She has now had a mastectomy on 12/18/2011. The final pathology revealed 2 foci of well-differentiated invasive ductal carcinoma one measuring 1.7 cm and the second measuring 0.7 cm there was ductal carcinoma in situ lymphovascular invasion was also identified all surgical margins were negative for carcinoma. Patient went on to also have a left sentinel node biopsy one sentinel node was positive for metastatic disease with focal capsular extension. The tumor was estrogen receptor positive progesterone receptor positive. The largest tumor measuring 1.7 cm was HER-2/neu negative with a Ki-67 of 13%. The smaller tumor showed ER +100% PR receptor 7% proliferation marker 50% and it was HER-2/neu positive with a ratio 4.94.  #3 patient has been seen by radiation oncology and post mastectomy radiation therapy is recommended. At this time she will not get a left axillary lymph node dissection.  #4 patient'Brandy [name] tumor is HER-2/neu positive and does she will receive adjuvant chemotherapy and Herceptin based therapy.  CURRENT THERAPY: Patient will begin Taxol and Herceptin in August 2013 after she has her Port-A-Cath placed.  INTERVAL HISTORY: Brandy Brandy [name]  Brandy Brandy [name] 78 y.o. female returns for followup visit today. Postoperatively patient seems to be doing well she still has to JP drains in. She is going to be seeing Dr. Emelia Loron tomorrow to have one of them removed. She has no evidence of local infections. She denies any fevers chills night sweats headaches no shortness of breath no chest pains. Remainder of the 10 point review of systems is negative. MEDICAL HISTORY: Past Medical History  Diagnosis Date  . Asthma   . Hyperlipidemia     takes Simvastatin daily  . Hypertension     takes Amlodipine and Diovan daily  . Cancer     left breast  . Bronchitis     hx of;last time >64yr ago  . Arthritis     shoulders  . Dysphagia   . H/O hiatal hernia   . GERD (gastroesophageal reflux disease)     takes Prilosec prn  . Urinary urgency   . History of kidney stones     many yrs ago  . Depression     takes Celexa daily  . Insomnia     takes Ambien nightly  . Breast cancer 12/18/11    left breast masectomy=metastatic ca in (1/1) lymph node ,invasive ductal ca,2 foci,,dcis,lymph ovascular invasion identified,surgical resection margins neg for ca,additional tissue=benign skin and subcutaneous tissue  . Breast CA 11/23/11    left breast 3 o'clock bx=high grade ductal ca in situ w/comedononecrosis and calcification,dcis,lymphovascular invasion present ,ER?PR+positive    ALLERGIES:   has no known allergies.  MEDICATIONS:  Current Outpatient Prescriptions  Medication Sig Dispense Refill  . amLODipine (NORVASC) 10 MG tablet Take 10 mg by mouth daily.       Marland Kitchen  citalopram (CELEXA) 20 MG tablet Take 20 mg by mouth daily.      Marland Kitchen DIOVAN HCT 160-12.5 MG per tablet Take 2 tablets by mouth every morning.       Marland Kitchen omeprazole (PRILOSEC) 20 MG capsule Take 20 mg by mouth daily.      Marland Kitchen oxyCODONE (OXY IR/ROXICODONE) 5 MG immediate release tablet Take 1 tablet (5 mg total) by mouth every 4 (four) hours as needed.  20 tablet  0  . simvastatin (ZOCOR) 20 MG  tablet Take 20 mg by mouth daily.       Marland Kitchen zolpidem (AMBIEN) 10 MG tablet Take 10 mg by mouth at bedtime.        SURGICAL HISTORY:  Past Surgical History  Procedure Date  . Breast biopsy 1998    left  . Total shoulder replacement 2011    left  . Eus 06/04/2011    Procedure: UPPER ENDOSCOPIC ULTRASOUND (EUS) LINEAR;  Surgeon: Rob Bunting, MD;  Location: [facility];  Service: Endoscopy;  Laterality: N/A;  . Exploratory laparotomy      biopsy of intra-abdominal mass  . Bladder tack   . Tubal ligation   . Appendectomy   . Dilation and curettage of uterus   . Esophagogastroduodenoscopy   . Mastectomy w/ sentinel node biopsy 12/18/2011    Procedure: MASTECTOMY WITH SENTINEL LYMPH NODE BIOPSY;  Surgeon: Emelia Loron, MD;  Location: [facility] OR;  Service: General;  Laterality: Left;  . Jp drain 12/18/11    2 drains still in from left breast masectomy    REVIEW OF SYSTEMS:  Pertinent items are noted in HPI.   PHYSICAL EXAMINATION:  Patient is awake alert in no acute distress HEENT exam EOMI PERRLA sclerae anicteric no conjunctival pallor oral mucosa is moist no thrush or mucositis neck is supple no palpable cervical supraclavicular or axillary adenopathy lungs are clear but distant breath sounds no wheezes rales or rhonchi cardiovascular is regular rate rhythm no murmurs gallops or rubs abdomen is soft nontender nondistended bowel sounds are present no active splenomegaly extremities trace edema neuro patient'Brandy [name] alert oriented otherwise nonfocal right breast no masses or nipple discharge left mastectomy incisional site looks well he is healing JP drains are in place. There is no evidence of local infection.  ECOG PERFORMANCE STATUS: 1 - Symptomatic but completely ambulatory  Blood pressure 136/69, pulse 80, temperature 98.5 F (36.9 C), temperature source Oral, height 5' (1.524 m), weight 169 lb 8 oz (76.885 kg).  LABORATORY DATA: Lab Results  Component Value Date   WBC 10.8* 12/28/2011     HGB 9.8* 12/28/2011   HCT 29.9* 12/28/2011   MCV 85.0 12/28/2011   PLT 325 12/28/2011      Chemistry      Component Value Date/Time   NA 135 12/20/2011 0500   K 3.3* 12/20/2011 0500   CL 100 12/20/2011 0500   CO2 24 12/20/2011 0500   BUN 14 12/20/2011 0500   CREATININE 0.70 12/20/2011 0500      Component Value Date/Time   CALCIUM 8.7 12/20/2011 0500   ALKPHOS 84 08/20/2011 1242   AST 24 08/20/2011 1242   ALT 20 08/20/2011 1242   BILITOT 0.3 08/20/2011 1242    ADDITIONAL INFORMATION: 2. PROGNOSTIC INDICATORS - ACIS Results IMMUNOHISTOCHEMICAL AND MORPHOMETRIC ANALYSIS BY THE AUTOMATED CELLULAR IMAGING SYSTEM (ACIS) THE SMALLER TUMOR SHOWS THE FOLLOWING BREAST PROGNOSTIC PROFILE: Estrogen Receptor (Negative, <1%): 100%,POSITIVE, STRONG STAINING INTENSITY Progesterone Receptor (Negative, <1%): 7%,POSITIVE, STRONG STAINING INTENSITY Proliferation Marker Ki67 by Judie Petit  IB-1 (Low<20%): 50% All controls stained appropriately Abigail Miyamoto MD Pathologist, Electronic Signature ( Signed 12/25/2011) 2. CHROMOGENIC IN-SITU HYBRIDIZATION Interpretation: 0.7 CM SMALLER TUMOR: HER2/NEU BY CISH - SHOWS AMPLIFICATION BY CISH ANALYSIS. THE RATIO OF HER2: CEP 17 SIGNALS WAS 4.94. 1.7 CM LEFT SUPERIOR TUMOR: HER-2/NEU BY CISH - NO AMPLIFICATION OF HER-2 DETECTED. THE RATIO OF HER-2: CEP 17 SIGNALS WAS 1.14. Reference range: Ratio: HER2:CEP17 < 1.8 - gene amplification not observed Ratio: HER2:CEP 17 1.8-2.2 - equivocal result Ratio: HER2:CEP17 > 2.2 - gene amplification observed 1 of 4 FINAL for Brandy Brandy [name], Brandy Brandy [name] [phone]) ADDITIONAL INFORMATION:(continued) Comment: The cancer center was notified on 12-24-2011. Abigail Miyamoto MD Pathologist, Electronic Signature ( Signed 12/24/2011) FINAL DIAGNOSIS Diagnosis 1. Lymph node, sentinel, biopsy, Left - METASTATIC CARCINOMA IN 1 OF 1 LYMPH NODE WITH FOCAL CAPSULAR EXTENSION (1/1). 2. Breast, simple mastectomy, Left - INVASIVE DUCTAL CARCINOMA,  TWO FOCI, WELL DIFFERENTIATED, SPANNING 1.7 AND 0.7 CM. - DUCTAL CARCINOMA IN SITU. - LYMPHOVASCULAR INVASION IS IDENTIFIED. - THE SURGICAL RESECTION MARGINS ARE NEGATIVE FOR CARCINOMA. - SEE ONCOLOGY TABLE BELOW. 3. Breast, biopsy, left, additional tissue - BENIGN SKIN AND SUBCUTANEOUS TISSUE. - THERE IS NO EVIDENCE OF MALIGNANCY. Microscopic Comment 2. BREAST, INVASIVE TUMOR, WITH LYMPH NODE SAMPLING Specimen, including laterality: Left breast. Procedure: Simple mastectomy Grade: Both are I Tubule formation: 1 and 1 Nuclear pleomorphism: 1 and 2 Mitotic:1 and 1 Tumor size (gross measurement): 1.7 and 0.7 cm Margins: Negative for carcinoma Invasive, distance to closest margin: 2.0 cm to the deep margin (gross measurement) In-situ, distance to closest margin: 2.0 cm from the deep margin (gross measurement). Lymphovascular invasion: Present. Ductal carcinoma in situ: Present. Grade: Intermediate grade. Extensive intraductal component: No. Lobular neoplasia: Not identified. Tumor focality: Two foci. Treatment effect: N/A Extent of tumor: Confined to breast parenchyma Lymph nodes: # examined: 1 Lymph nodes with metastasis: 1 Isolated tumor cells (< 0.2 mm): 0 Micrometastasis: (> 0.2 mm and < 2.0 mm): 0 Macrometastasis: (> 2.0 mm): 1 Extracapsular extension: Present, focal. 2 of 4 FINAL for Brandy Brandy [name], Brandy Brandy [name] [phone]) Microscopic Comment(continued) Breast prognostic profile: [phone] Estrogen receptor: Positive (100%, strong staining intensity). Progesterone receptor: Positive (100%, strong staining intensity. Her 2 neu: No amplification was detected. The ratio was 1.45. Her 2 neu by CISH will be repeated on both tumors and the results reported separately. Ki-67: 13% Non-neoplastic breast: Healing biopsy site. TNM: mpT1c, pN1a Comments: Grossly, there are two foci of grade I invasive ductal carcinoma present in the simple mastectomy. The larger nodule spans 1.7 cm  and is histologically identical to the previous core biopsy, [phone] ("left superior"). The second nodule, 0.7 cm, is grade I invasive ductal carcinoma with extracellular mucin. A complete profile will be performed on this second, smaller nodule. (JBK:gt, 12/22/11) Brandy Leisure MD Pathologist, Electronic Signature   RADIOGRAPHIC STUDIES:  No results found.  ASSESSMENT: 78 year old female with:  #1 new diagnosis of stage II invasive ductal carcinoma of the left breast she is status post mastectomy with finding of 2 foci of invasive ductal carcinoma both are ER/PR positive but the smaller focus measuring 0.7 cm is HER-2/neu positive with a ratio over 4.  #2 postoperatively patient is doing well but she still does have JP drains in the should be removed prior to her starting her treatment.  #3 the patient and I discussed her diagnosis and rationale for treatment. I do think that she needs some form of systemic adjuvant chemotherapy and Herceptin combination. We had an extensive  discussion regarding the rationale for this. Risks and benefits of this were discussed as well.     PLAN:   #1 I have discussed treatment with Taxol and Herceptin for 12 weeks. Once she completes this then she would go on to have Herceptin every 3 weeks since she certainly would go on to get radiation therapy as well. Eventually she will need antiestrogen therapy with an aromatase inhibitor and Herceptin. Her Herceptin therapy will all be for one full year.  #2 patient will need a Port-A-Cath placed and I have requested that to be done by Dr. Dwain Sarna.  #3 she will need an echocardiogram chemotherapy teaching class. And I have also sent a cardio oncology consultation to Dr. Wende Bushy [facility].  #4 our plan is to begin her treatment beginning of August 2013.  All questions were answered. The patient knows to call the clinic with any problems, questions or concerns. We can certainly see the patient much  sooner if necessary.  I spent 20 minutes counseling the patient face to face. The total time spent in the appointment was 30 minutes.    Drue Second, MD Medical/Oncology [facility] [phone] (beeper) [phone] (Office)  12/28/2011, 12:47 PM

## 2011-12-28 NOTE — Patient Instructions (Addendum)
1. Echocardiogram  2. Dr. Gala Romney appointment to follow along  3. Port a cath placement for chemotherapy  4. Chemo class  5. PET scan  6. I will see you back on 02/08/12 in follow up and first chemotherapy

## 2011-12-29 ENCOUNTER — Telehealth: Payer: Self-pay | Admitting: *Deleted

## 2011-12-29 ENCOUNTER — Ambulatory Visit (INDEPENDENT_AMBULATORY_CARE_PROVIDER_SITE_OTHER): Payer: Medicare Other | Admitting: General Surgery

## 2011-12-29 ENCOUNTER — Encounter (INDEPENDENT_AMBULATORY_CARE_PROVIDER_SITE_OTHER): Payer: Self-pay | Admitting: General Surgery

## 2011-12-29 VITALS — BP 136/58 | HR 93 | Temp 98.1°F | Ht 60.0 in | Wt 170.0 lb

## 2011-12-29 DIAGNOSIS — Z09 Encounter for follow-up examination after completed treatment for conditions other than malignant neoplasm: Secondary | ICD-10-CM

## 2011-12-29 NOTE — Telephone Encounter (Signed)
Per orders starting on 02-08-2012 every week for herceptin sent michelle email to set up patient's appointments

## 2011-12-29 NOTE — Progress Notes (Signed)
Subjective:     Patient ID: Brandy [name], female   DOB: Dec 29, 1932, 78 y.o.   MRN: [id number]  HPI 51 yof who underwent left sm, snbx for left breast cancer.  Her sentinel node was positive.  One of the tumors ends up being her2 amplified. She has seen med onc now.  She should be seeing rad onc as well.  She returns today doing well without complaint.  One of her drains is putting out less than 30 per day.  Review of Systems     Objective:   Physical Exam Left mastectomy incision clean without infection, drains with serous fluid    Assessment:     S/p left mastectomy/sn    Plan:     I removed one drain.  She will call later this week and hopefully will take other drain out this week. We discussed port placement.  I discussed risks and benefits. Will need to see rad onc as well and determine what we need to do with axillary nodes.

## 2011-12-29 NOTE — Telephone Encounter (Signed)
Per staff message and POF I have scheduled appts. Ist treatment appt on 8/5 to late after MD visit. [facility] for new appt. JMW

## 2011-12-30 ENCOUNTER — Telehealth (INDEPENDENT_AMBULATORY_CARE_PROVIDER_SITE_OTHER): Payer: Self-pay

## 2011-12-30 NOTE — Telephone Encounter (Signed)
LMOM for pt to call me back. I adv. Pt to call me on Friday if she doesn't get me today. I just need to ask her the questions on our [facility] sheet before her PAC insertion.

## 2012-01-01 ENCOUNTER — Inpatient Hospital Stay (HOSPITAL_COMMUNITY): Admission: RE | Admit: 2012-01-01 | Payer: Medicare Other | Source: Ambulatory Visit

## 2012-01-01 ENCOUNTER — Encounter (INDEPENDENT_AMBULATORY_CARE_PROVIDER_SITE_OTHER): Payer: Self-pay | Admitting: General Surgery

## 2012-01-01 ENCOUNTER — Telehealth: Payer: Self-pay | Admitting: Oncology

## 2012-01-01 ENCOUNTER — Ambulatory Visit (INDEPENDENT_AMBULATORY_CARE_PROVIDER_SITE_OTHER): Payer: Medicare Other | Admitting: General Surgery

## 2012-01-01 VITALS — BP 146/62 | HR 69 | Temp 98.0°F | Ht 60.0 in | Wt 170.2 lb

## 2012-01-01 DIAGNOSIS — Z09 Encounter for follow-up examination after completed treatment for conditions other than malignant neoplasm: Secondary | ICD-10-CM

## 2012-01-01 NOTE — Progress Notes (Signed)
Subjective:     Patient ID: Brandy [name], female   DOB: April 23, 1933, 78 y.o.   MRN: [id number]  HPI 78yof s/p left sm/snbx for her2 positive, node positive tumor.  She has been seen in multidisciplinary fashion and discussed at conference.  She does not want alnd.  We have decided on systemic therapy and radiation.  She is doing well and has less than 30 cc per day out of her drain.  Review of Systems     Objective:   Physical Exam Left mastectomy incision clean and healing well.  Flaps viable.  Drain with mild amount of serous fluid    Assessment:     S/p left sm/snbx    Plan:     I removed last drain.  She can shower. Referral to pt after snbx and due to left shoulder issues.  She is ok with decision to forgo alnd.  I discussed port placement again today and showed her the port.

## 2012-01-01 NOTE — Telephone Encounter (Signed)
Pt dtr, Brandy [name] called today to check on appts after receiving a call from radiology that her mom missed a pet scan appt this morning. Per dtr they were here on 6/24 and was awaiting a call re the appts but had not heard anything. Pet/echo is scheduled for 7/1 and dtr is aware. Also pt needed an appt w/Dr. Clarise Cruz. S/w Dawn today and pt was given an appt for 7/31. Per Dawn ok for echo to remain @ [facility]. dtr aware of appt w/Dr. Clarise Cruz. Daughter also aware that appts @ center are complete w/next appt being 7/16 for chemo ed. Dtr will get schedule on 7/16. She is aware that pt will see KK 8/5 and start tx 8/6.

## 2012-01-01 NOTE — Patient Instructions (Signed)
May shower today Place dressings on drain sites until they are not leaking anymore.

## 2012-01-04 ENCOUNTER — Ambulatory Visit (HOSPITAL_COMMUNITY)
Admission: RE | Admit: 2012-01-04 | Discharge: 2012-01-04 | Disposition: A | Discharge status: Home / Self Care | Payer: Medicare Other | Source: Ambulatory Visit | Attending: Oncology | Admitting: Oncology

## 2012-01-04 ENCOUNTER — Encounter (HOSPITAL_COMMUNITY)
Admission: RE | Admit: 2012-01-04 | Discharge: 2012-01-04 | Disposition: A | Discharge status: Home / Self Care | Payer: Medicare Other | Source: Ambulatory Visit | Attending: Oncology | Admitting: Oncology

## 2012-01-04 ENCOUNTER — Encounter (HOSPITAL_COMMUNITY): Payer: Self-pay

## 2012-01-04 ENCOUNTER — Telehealth: Payer: Self-pay | Admitting: Medical Oncology

## 2012-01-04 DIAGNOSIS — I517 Cardiomegaly: Secondary | ICD-10-CM | POA: Insufficient documentation

## 2012-01-04 DIAGNOSIS — C50919 Malignant neoplasm of unspecified site of unspecified female breast: Secondary | ICD-10-CM | POA: Insufficient documentation

## 2012-01-04 DIAGNOSIS — C50419 Malignant neoplasm of upper-outer quadrant of unspecified female breast: Secondary | ICD-10-CM

## 2012-01-04 DIAGNOSIS — Z9010 Acquired absence of unspecified breast and nipple: Secondary | ICD-10-CM | POA: Insufficient documentation

## 2012-01-04 MED ORDER — FLUDEOXYGLUCOSE F - 18 (FDG) INJECTION
13.6000 | Freq: Once | INTRAVENOUS | Status: AC | PRN
  Administered 2012-01-04: 13.6 via INTRAVENOUS

## 2012-01-04 NOTE — Telephone Encounter (Signed)
LM per MD, no evidence of cancer in patient's mother's body by PET scan.  No further questions at this time.

## 2012-01-04 NOTE — Telephone Encounter (Signed)
Message copied by Tylene Fantasia on Mon Jan 04, 2012  3:36 PM ------      Message from: Victorino December      Created: Mon Jan 04, 2012  3:36 PM       Please call patient:call daughter PET scan no evidence of caner in the body by PET

## 2012-01-04 NOTE — Progress Notes (Signed)
  Echocardiogram 2D Echocardiogram has been performed.  Brandy [name] 01/04/2012, 2:25 PM

## 2012-01-06 ENCOUNTER — Encounter (INDEPENDENT_AMBULATORY_CARE_PROVIDER_SITE_OTHER): Payer: Medicare Other | Admitting: General Surgery

## 2012-01-08 ENCOUNTER — Telehealth (INDEPENDENT_AMBULATORY_CARE_PROVIDER_SITE_OTHER): Payer: Self-pay

## 2012-01-08 NOTE — Telephone Encounter (Signed)
Called to ok 1 Rf on Hydrocodone-Norco 5/325mg  #30 phoned to [facility] per Dr Dwain Sarna.

## 2012-01-19 ENCOUNTER — Other Ambulatory Visit: Payer: Medicare Other

## 2012-01-19 ENCOUNTER — Encounter: Payer: Self-pay | Admitting: *Deleted

## 2012-01-20 ENCOUNTER — Encounter (HOSPITAL_BASED_OUTPATIENT_CLINIC_OR_DEPARTMENT_OTHER): Payer: Self-pay | Admitting: *Deleted

## 2012-01-20 NOTE — Progress Notes (Signed)
To come in for bmet 

## 2012-01-21 ENCOUNTER — Encounter (INDEPENDENT_AMBULATORY_CARE_PROVIDER_SITE_OTHER): Payer: Self-pay

## 2012-01-25 ENCOUNTER — Encounter (INDEPENDENT_AMBULATORY_CARE_PROVIDER_SITE_OTHER): Payer: Self-pay | Admitting: General Surgery

## 2012-01-25 ENCOUNTER — Encounter (HOSPITAL_BASED_OUTPATIENT_CLINIC_OR_DEPARTMENT_OTHER)
Admission: RE | Admit: 2012-01-25 | Discharge: 2012-01-25 | Disposition: A | Discharge status: Home / Self Care | Payer: Medicare Other | Source: Ambulatory Visit | Attending: General Surgery | Admitting: General Surgery

## 2012-01-25 ENCOUNTER — Ambulatory Visit (INDEPENDENT_AMBULATORY_CARE_PROVIDER_SITE_OTHER): Payer: Medicare Other | Admitting: General Surgery

## 2012-01-25 VITALS — BP 122/64 | HR 88 | Temp 96.8°F | Resp 24 | Ht 61.0 in | Wt 166.8 lb

## 2012-01-25 DIAGNOSIS — Z09 Encounter for follow-up examination after completed treatment for conditions other than malignant neoplasm: Secondary | ICD-10-CM

## 2012-01-25 LAB — CBC
Platelets: 271 10*3/uL (ref 150–400)
RDW: 14.3 % (ref 11.5–15.5)
WBC: 12.6 10*3/uL — ABNORMAL HIGH (ref 4.0–10.5)

## 2012-01-25 LAB — BASIC METABOLIC PANEL
Calcium: 9.7 mg/dL (ref 8.4–10.5)
Chloride: 102 mEq/L (ref 96–112)
Creatinine, Ser: 0.56 mg/dL (ref 0.50–1.10)
GFR calc Af Amer: >90 mL/min (ref >90)
Sodium: 138 mEq/L (ref 135–145)

## 2012-01-25 NOTE — Progress Notes (Signed)
Subjective:     Patient ID: Brandy [name], female   DOB: 04/05/1933, 78 y.o.   MRN: [id number]  HPI This is a 78-year-old female who I know from an exploratory laparotomy for an abdominal mass that ends up being benign. Following this she was diagnosed with left breast cancer for which she underwent a left simple mastectomy and sentinel lymph node biopsy. This is a stage II breast cancer that his HER-2/neu positive initiate been recommended chemotherapy at this point. She returns today prior to port placement doing well without any real complaints. Her incision is healing in her surgical site is not causing her any trouble at all.  Review of Systems NUCLEAR MEDICINE PET SKULL BASE TO THIGH  Fasting Blood Glucose: 183  Technique: 13.6 mCi F-18 FDG was injected intravenously. CT data  was obtained and used for attenuation correction and anatomic  localization only. (This was not acquired as a diagnostic CT  examination.) Additional exam technical data entered on  technologist worksheet.  Comparison: 04/01/2011  Findings:  Neck: No hypermetabolic lymph nodes in the neck.  Chest: Status post left mastectomy. Associated postsurgical  changes in the left chest wall with vague hypermetabolism, max SUV  3.0. No focal hypermetabolism to suggest residual tumor. 1.6 x  4.3 cm gas and fluid collection in the medial left chest wall  (series 2/image 85), likely reflecting a postsurgical seroma.  Small mediastinal lymph nodes including a 7 mm right paratracheal  node (series 2/image 58), non-FDG-avid. No hypermetabolic  mediastinal or hilar nodes. Left axillary lymph node dissection.  No suspicious pulmonary nodules on the CT scan. Cardiomegaly.  Abdomen/Pelvis: No hypermetabolic lymph nodes in the abdomen or  pelvis. Complete resolution of prior peripancreatic/celiac axis  hypermetabolic soft tissue.  No abnormal hypermetabolic activity within the liver, pancreas,  adrenal glands, or spleen. Colonic  diverticulosis, without  associated inflammatory changes. Suspected omental infarct in the  right mid abdomen (series 2/image 155).  Postsurgical changes in the midline anterior abdominal wall.  Skeleton: No focal hypermetabolic activity to suggest skeletal  metastasis. Degenerative changes of the visualized thoracolumbar  spine and right sacroiliac joint. Suspected hemangioma in the left  T4 vertebral body (series 2/image 55).  IMPRESSION:  Status post left mastectomy with associated postsurgical changes  and suspected postoperative seroma in the medial left chest wall.  Associated vague hypermetabolism in the left chest wall is favored  to be postsurgical.  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  No evidence of metastatic disease or lymphomatous involvement.     Objective:   Physical Exam Healed left mastectomy incision, small seroma medially    Assessment:     S/p left mastectomy/snbx    Plan:     She is doing well from her mastectomy I think the small seroma will resolve on its own. I plan to place a port on Wednesday she and I discussed the performance of this procedure as well as placement of the port again today.      

## 2012-01-27 ENCOUNTER — Ambulatory Visit (HOSPITAL_COMMUNITY): Payer: Medicare Other

## 2012-01-27 ENCOUNTER — Encounter (HOSPITAL_BASED_OUTPATIENT_CLINIC_OR_DEPARTMENT_OTHER): Payer: Self-pay | Admitting: Certified Registered"

## 2012-01-27 ENCOUNTER — Ambulatory Visit (HOSPITAL_BASED_OUTPATIENT_CLINIC_OR_DEPARTMENT_OTHER)
Admission: RE | Admit: 2012-01-27 | Discharge: 2012-01-27 | Disposition: A | Discharge status: Home / Self Care | Payer: Medicare Other | Source: Ambulatory Visit | Attending: General Surgery | Admitting: General Surgery

## 2012-01-27 ENCOUNTER — Encounter (HOSPITAL_BASED_OUTPATIENT_CLINIC_OR_DEPARTMENT_OTHER)
Admission: RE | Disposition: A | Discharge status: Home / Self Care | Payer: Self-pay | Source: Ambulatory Visit | Attending: General Surgery

## 2012-01-27 ENCOUNTER — Encounter (HOSPITAL_BASED_OUTPATIENT_CLINIC_OR_DEPARTMENT_OTHER): Payer: Self-pay

## 2012-01-27 ENCOUNTER — Ambulatory Visit (HOSPITAL_BASED_OUTPATIENT_CLINIC_OR_DEPARTMENT_OTHER): Payer: Medicare Other | Admitting: Certified Registered"

## 2012-01-27 DIAGNOSIS — I10 Essential (primary) hypertension: Secondary | ICD-10-CM | POA: Insufficient documentation

## 2012-01-27 DIAGNOSIS — J45909 Unspecified asthma, uncomplicated: Secondary | ICD-10-CM | POA: Insufficient documentation

## 2012-01-27 DIAGNOSIS — Z9010 Acquired absence of unspecified breast and nipple: Secondary | ICD-10-CM | POA: Insufficient documentation

## 2012-01-27 DIAGNOSIS — C50919 Malignant neoplasm of unspecified site of unspecified female breast: Secondary | ICD-10-CM | POA: Insufficient documentation

## 2012-01-27 DIAGNOSIS — K219 Gastro-esophageal reflux disease without esophagitis: Secondary | ICD-10-CM | POA: Insufficient documentation

## 2012-01-27 HISTORY — DX: Presence of dental prosthetic device (complete) (partial): Z97.2

## 2012-01-27 HISTORY — PX: PORTACATH PLACEMENT: SHX2246

## 2012-01-27 HISTORY — DX: Complete loss of teeth, unspecified cause, unspecified class: K08.109

## 2012-01-27 SURGERY — INSERTION, TUNNELED CENTRAL VENOUS DEVICE, WITH PORT
Anesthesia: General | Site: Chest | Laterality: Right | Wound class: Clean

## 2012-01-27 MED ORDER — OXYCODONE-ACETAMINOPHEN 5-325 MG PO TABS
1.0000 | ORAL_TABLET | Freq: Once | ORAL | Status: AC
  Administered 2012-01-27: 1 via ORAL

## 2012-01-27 MED ORDER — FENTANYL CITRATE 0.05 MG/ML IJ SOLN: 25.0000 ug | INTRAMUSCULAR | Status: DC | PRN

## 2012-01-27 MED ORDER — BUPIVACAINE HCL (PF) 0.25 % IJ SOLN
INTRAMUSCULAR | Status: DC | PRN
  Administered 2012-01-27: 10 mL

## 2012-01-27 MED ORDER — CEFAZOLIN SODIUM-DEXTROSE 2-3 GM-% IV SOLR
2.0000 g | INTRAVENOUS | Status: AC
  Administered 2012-01-27: 2 g via INTRAVENOUS

## 2012-01-27 MED ORDER — HEPARIN SOD (PORK) LOCK FLUSH 100 UNIT/ML IV SOLN
INTRAVENOUS | Status: DC | PRN
  Administered 2012-01-27: 400 [IU] via INTRAVENOUS

## 2012-01-27 MED ORDER — MEPERIDINE HCL 25 MG/ML IJ SOLN: 6.2500 mg | INTRAMUSCULAR | Status: DC | PRN

## 2012-01-27 MED ORDER — ONDANSETRON HCL 4 MG/2ML IJ SOLN
INTRAMUSCULAR | Status: DC | PRN
  Administered 2012-01-27: 4 mg via INTRAVENOUS

## 2012-01-27 MED ORDER — OXYCODONE-ACETAMINOPHEN 5-325 MG PO TABS: 1.0000 | ORAL_TABLET | ORAL | Status: AC | PRN

## 2012-01-27 MED ORDER — PROPOFOL 10 MG/ML IV EMUL
INTRAVENOUS | Status: DC | PRN
  Administered 2012-01-27: 120 mg via INTRAVENOUS

## 2012-01-27 MED ORDER — PROMETHAZINE HCL 25 MG/ML IJ SOLN: 6.2500 mg | INTRAMUSCULAR | Status: DC | PRN

## 2012-01-27 MED ORDER — LACTATED RINGERS IV SOLN
INTRAVENOUS | Status: DC
  Administered 2012-01-27: 08:00:00 via INTRAVENOUS

## 2012-01-27 MED ORDER — MIDAZOLAM HCL 2 MG/2ML IJ SOLN: 0.5000 mg | Freq: Once | INTRAMUSCULAR | Status: DC | PRN

## 2012-01-27 MED ORDER — FENTANYL CITRATE 0.05 MG/ML IJ SOLN
INTRAMUSCULAR | Status: DC | PRN
  Administered 2012-01-27: 100 ug via INTRAVENOUS

## 2012-01-27 MED ORDER — DEXAMETHASONE SODIUM PHOSPHATE 4 MG/ML IJ SOLN
INTRAMUSCULAR | Status: DC | PRN
  Administered 2012-01-27: 4 mg via INTRAVENOUS

## 2012-01-27 MED ORDER — EPHEDRINE SULFATE 50 MG/ML IJ SOLN
INTRAMUSCULAR | Status: DC | PRN
  Administered 2012-01-27: 10 mg via INTRAVENOUS

## 2012-01-27 MED ORDER — HEPARIN (PORCINE) IN NACL 2-0.9 UNIT/ML-% IJ SOLN
INTRAMUSCULAR | Status: DC | PRN
  Administered 2012-01-27: 1 via INTRAVENOUS

## 2012-01-27 MED ORDER — LIDOCAINE HCL (CARDIAC) 20 MG/ML IV SOLN
INTRAVENOUS | Status: DC | PRN
  Administered 2012-01-27: 40 mg via INTRAVENOUS

## 2012-01-27 SURGICAL SUPPLY — 48 items
BAG DECANTER FOR FLEXI CONT (MISCELLANEOUS) ×2 IMPLANT
BLADE SURG 11 STRL SS (BLADE) ×2 IMPLANT
BLADE SURG 15 STRL LF DISP TIS (BLADE) ×1 IMPLANT
BLADE SURG 15 STRL SS (BLADE) ×1
CANISTER SUCTION 1200CC (MISCELLANEOUS) IMPLANT
CHLORAPREP W/TINT 26ML (MISCELLANEOUS) ×2 IMPLANT
CLOTH BEACON ORANGE TIMEOUT ST (SAFETY) ×2 IMPLANT
COVER MAYO STAND STRL (DRAPES) ×2 IMPLANT
COVER TABLE BACK 60X90 (DRAPES) ×2 IMPLANT
DECANTER SPIKE VIAL GLASS SM (MISCELLANEOUS) IMPLANT
DERMABOND ADVANCED (GAUZE/BANDAGES/DRESSINGS) ×1
DERMABOND ADVANCED .7 DNX12 (GAUZE/BANDAGES/DRESSINGS) ×1 IMPLANT
DRAPE C-ARM 42X72 X-RAY (DRAPES) ×2 IMPLANT
DRAPE LAPAROSCOPIC ABDOMINAL (DRAPES) ×2 IMPLANT
DRSG TEGADERM 4X4.75 (GAUZE/BANDAGES/DRESSINGS) ×2 IMPLANT
ELECT COATED BLADE 2.86 ST (ELECTRODE) ×2 IMPLANT
ELECT REM PT RETURN 9FT ADLT (ELECTROSURGICAL) ×2
ELECTRODE REM PT RTRN 9FT ADLT (ELECTROSURGICAL) ×1 IMPLANT
GAUZE SPONGE 4X4 12PLY STRL LF (GAUZE/BANDAGES/DRESSINGS) ×2 IMPLANT
GLOVE BIO SURGEON STRL SZ7 (GLOVE) ×2 IMPLANT
GLOVE BIOGEL PI IND STRL 7.5 (GLOVE) ×1 IMPLANT
GLOVE BIOGEL PI INDICATOR 7.5 (GLOVE) ×1
GLOVE ECLIPSE 6.5 STRL STRAW (GLOVE) ×2 IMPLANT
GOWN PREVENTION PLUS XLARGE (GOWN DISPOSABLE) ×4 IMPLANT
IV HEPARIN 1000UNITS/500ML (IV SOLUTION) ×2 IMPLANT
IV KIT MINILOC 20X1 SAFETY (NEEDLE) IMPLANT
KIT PORT POWER 8FR ISP CVUE (Catheter) ×2 IMPLANT
KIT POWER CATH 8FR (Catheter) IMPLANT
NDL SAFETY ECLIPSE 18X1.5 (NEEDLE) IMPLANT
NEEDLE HYPO 18GX1.5 SHARP (NEEDLE)
NEEDLE HYPO 25X1 1.5 SAFETY (NEEDLE) ×2 IMPLANT
PACK BASIN DAY SURGERY FS (CUSTOM PROCEDURE TRAY) ×2 IMPLANT
PENCIL BUTTON HOLSTER BLD 10FT (ELECTRODE) ×2 IMPLANT
SLEEVE SCD COMPRESS KNEE MED (MISCELLANEOUS) ×2 IMPLANT
STAPLER VISISTAT 35W (STAPLE) ×2 IMPLANT
SUT MON AB 4-0 PC3 18 (SUTURE) ×2 IMPLANT
SUT PROLENE 2 0 SH DA (SUTURE) ×2 IMPLANT
SUT SILK 2 0 TIES 17X18 (SUTURE)
SUT SILK 2-0 18XBRD TIE BLK (SUTURE) IMPLANT
SUT VIC AB 3-0 SH 27 (SUTURE) ×1
SUT VIC AB 3-0 SH 27X BRD (SUTURE) ×1 IMPLANT
SYR 5ML LUER SLIP (SYRINGE) ×2 IMPLANT
SYR CONTROL 10ML LL (SYRINGE) ×2 IMPLANT
TOWEL OR 17X24 6PK STRL BLUE (TOWEL DISPOSABLE) ×2 IMPLANT
TOWEL OR NON WOVEN STRL DISP B (DISPOSABLE) IMPLANT
TUBE CONNECTING 20X1/4 (TUBING) IMPLANT
WATER STERILE IRR 1000ML POUR (IV SOLUTION) IMPLANT
YANKAUER SUCT BULB TIP NO VENT (SUCTIONS) IMPLANT

## 2012-01-27 NOTE — Transfer of Care (Signed)
Immediate Anesthesia Transfer of Care Note  Patient: Brandy [name]  Procedure(s) Performed: Procedure(s) (LRB): INSERTION PORT-A-CATH (Right)  Patient Location: PACU  Anesthesia Type: General  Level of Consciousness: awake, alert , oriented and patient cooperative  Airway & Oxygen Therapy: Patient Spontanous Breathing and Patient connected to face mask oxygen  Post-op Assessment: Report given to PACU RN and Post -op Vital signs reviewed and stable  Post vital signs: Reviewed and stable  Complications: No apparent anesthesia complications

## 2012-01-27 NOTE — Anesthesia Procedure Notes (Addendum)
Performed by: Verlan Friends   Procedure Name: LMA Insertion Date/Time: 01/27/2012 8:41 AM Performed by: Verlan Friends Pre-anesthesia Checklist: Patient identified, Emergency Drugs available, Suction available, Patient being monitored and Timeout performed Patient Re-evaluated:Patient Re-evaluated prior to inductionOxygen Delivery Method: Circle System Utilized Preoxygenation: Pre-oxygenation with 100% oxygen Intubation Type: IV induction Ventilation: Mask ventilation without difficulty LMA: LMA inserted LMA Size: 3.0 Number of attempts: 1 Intubation method: gauze bite gaurd. Placement Confirmation: positive ETCO2 Tube secured with: Tape Dental Injury: Teeth and Oropharynx as per pre-operative assessment  Comments: Pt. Edentulous.

## 2012-01-27 NOTE — H&P (View-Only) (Signed)
Subjective:     Patient ID: Brandy [name], female   DOB: February 01, 1933, 78 y.o.   MRN: [id number]  HPI This is a 78 year old female who I know from an exploratory laparotomy for an abdominal mass that ends up being benign. Following this she was diagnosed with left breast cancer for which she underwent a left simple mastectomy and sentinel lymph node biopsy. This is a stage II breast cancer that his HER-2/neu positive initiate been recommended chemotherapy at this point. She returns today prior to port placement doing well without any real complaints. Her incision is healing in her surgical site is not causing her any trouble at all.  Review of Systems NUCLEAR MEDICINE PET SKULL BASE TO THIGH  Fasting Blood Glucose: 183  Technique: 13.6 mCi F-18 FDG was injected intravenously. CT data  was obtained and used for attenuation correction and anatomic  localization only. (This was not acquired as a diagnostic CT  examination.) Additional exam technical data entered on  technologist worksheet.  Comparison: 04/01/2011  Findings:  Neck: No hypermetabolic lymph nodes in the neck.  Chest: Status post left mastectomy. Associated postsurgical  changes in the left chest wall with vague hypermetabolism, max SUV  3.0. No focal hypermetabolism to suggest residual tumor. 1.6 x  4.3 cm gas and fluid collection in the medial left chest wall  (series 2/image 85), likely reflecting a postsurgical seroma.  Small mediastinal lymph nodes including a 7 mm right paratracheal  node (series 2/image 58), non-FDG-avid. No hypermetabolic  mediastinal or hilar nodes. Left axillary lymph node dissection.  No suspicious pulmonary nodules on the CT scan. Cardiomegaly.  Abdomen/Pelvis: No hypermetabolic lymph nodes in the abdomen or  pelvis. Complete resolution of prior peripancreatic/celiac axis  hypermetabolic soft tissue.  No abnormal hypermetabolic activity within the liver, pancreas,  adrenal glands, or spleen. Colonic  diverticulosis, without  associated inflammatory changes. Suspected omental infarct in the  right mid abdomen (series 2/image 155).  Postsurgical changes in the midline anterior abdominal wall.  Skeleton: No focal hypermetabolic activity to suggest skeletal  metastasis. Degenerative changes of the visualized thoracolumbar  spine and right sacroiliac joint. Suspected hemangioma in the left  T4 vertebral body (series 2/image 55).  IMPRESSION:  Status post left mastectomy with associated postsurgical changes  and suspected postoperative seroma in the medial left chest wall.  Associated vague hypermetabolism in the left chest wall is favored  to be postsurgical.  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  No evidence of metastatic disease or lymphomatous involvement.     Objective:   Physical Exam Healed left mastectomy incision, small seroma medially    Assessment:     S/p left mastectomy/snbx    Plan:     She is doing well from her mastectomy I think the small seroma will resolve on its own. I plan to place a port on Wednesday she and I discussed the performance of this procedure as well as placement of the port again today.

## 2012-01-27 NOTE — Anesthesia Preprocedure Evaluation (Addendum)
Anesthesia Evaluation  Patient identified by MRN, date of birth, ID band Patient awake    Reviewed: Allergy & Precautions, H&P , NPO status , Patient's Chart, lab work & pertinent test results  History of Anesthesia Complications Negative for: history of anesthetic complications  Airway Mallampati: I TM Distance: >3 FB Neck ROM: Full    Dental  (+) Edentulous Upper and Edentulous Lower   Pulmonary asthma (never uses inhaler) ,  breath sounds clear to auscultation  Pulmonary exam normal       Cardiovascular hypertension, Pt. on medications Rhythm:Regular Rate:Normal  ECHO: normal LVF, except for mild diastolic dysfunction, Valves OK   Neuro/Psych negative neurological ROS     GI/Hepatic Neg liver ROS, GERD-  Medicated and Controlled,  Endo/Other  negative endocrine ROS  Renal/GU negative Renal ROS     Musculoskeletal   Abdominal (+) + obese,   Peds  Hematology   Anesthesia Other Findings Breast cancer  Reproductive/Obstetrics                           Anesthesia Physical Anesthesia Plan  ASA: II  Anesthesia Plan: General   Post-op Pain Management:    Induction: Intravenous  Airway Management Planned: LMA  Additional Equipment:   Intra-op Plan:   Post-operative Plan:   Informed Consent: I have reviewed the patients History and Physical, chart, labs and discussed the procedure including the risks, benefits and alternatives for the proposed anesthesia with the patient or authorized representative who has indicated his/her understanding and acceptance.     Plan Discussed with: CRNA and Surgeon  Anesthesia Plan Comments: (Plan routine monitors, GA- LMA OK)        Anesthesia Quick Evaluation

## 2012-01-27 NOTE — Anesthesia Postprocedure Evaluation (Signed)
  Anesthesia Post-op Note  Patient: Brandy [name]  Procedure(s) Performed: Procedure(s) (LRB): INSERTION PORT-A-CATH (Right)  Patient Location: PACU  Anesthesia Type: General  Level of Consciousness: awake, alert  and oriented  Airway and Oxygen Therapy: Patient Spontanous Breathing  Post-op Pain: none  Post-op Assessment: Post-op Vital signs reviewed, Patient's Cardiovascular Status Stable, Respiratory Function Stable, Patent Airway, No signs of Nausea or vomiting and Pain level controlled  Post-op Vital Signs: Reviewed and stable  Complications: No apparent anesthesia complications

## 2012-01-27 NOTE — Op Note (Signed)
Preoperative diagnosis: Breast cancer need for venous access Postoperative diagnosis: SAA Procedure: 8 French MRI compatible ClearVue power port placement, right subclavian Surgeon: Dr. Harden Mo Anesthesia: Gen. By patient choice Estimate blood loss: Minimal Specimens: None Drains: None Complications: None Sponge and needle count correct x2 at end of operation Disposition to recovery stable  Indications: This is a 31 yof from a laparotomy for intra-abdominal mass that was then followed later by a mastectomy and sentinel node biopsy for left breast cancer. She is now going to receive chemotherapy. We discussed port placement for venous access for chemotherapy.  Procedure: After informed consent was obtained the patient was taken to the operating room. She was administered 2 g of intravenous cefazolin. Sequential compression devices were placed on her legs. She was placed under general anesthesia. Her arms were then tucked in appropriate padding. I placed an axillary roll. Her chest was then prepped and draped in the standard sterile surgical fashion. A surgical timeout was performed.  I accessed her right subclavian vein on the first pass. The wire was placed. This was confirmed by fluoroscopy. I then made an incision in a pocket overlying the pectoralis muscle. I then tunneled the line between the insertion site in the port site. I then placed the dilator. I then placed the dilator and sheath assembly under fluoroscopy and removed the wire assembly. I then placed a line through this and removed the peel-away sheath. I then pulled this back to be in the distal cava. I then attached to the port. The port was sutured into 3 positions with 2-0 Prolene suture. I flushed this and aspirated blood. I packed this with heparin. I then closes with 3-0 Vicryl, 4 Monocryl, and Dermabond. Final fluoroscopic images showed that the port was in good position with no kink in the line and the line was in the  distal cava. She tolerated this well was transferred to recovery stable.

## 2012-01-27 NOTE — Interval H&P Note (Signed)
History and Physical Interval Note:  01/27/2012 8:15 AM  Brandy [name]  has presented today for surgery, with the diagnosis of place port for chemotherapy  The various methods of treatment have been discussed with the patient and family. After consideration of risks, benefits and other options for treatment, the patient has consented to  Procedure(s) (LRB): INSERTION PORT-A-CATH (N/A) as a surgical intervention .  The patient's history has been reviewed, patient examined, no change in status, stable for surgery.  I have reviewed the patient's chart and labs.  Questions were answered to the patient's satisfaction.     [name]

## 2012-01-28 ENCOUNTER — Encounter (HOSPITAL_BASED_OUTPATIENT_CLINIC_OR_DEPARTMENT_OTHER): Payer: Self-pay | Admitting: General Surgery

## 2012-02-03 ENCOUNTER — Ambulatory Visit (HOSPITAL_COMMUNITY)
Admission: RE | Admit: 2012-02-03 | Discharge: 2012-02-03 | Disposition: A | Discharge status: Home / Self Care | Payer: Medicare Other | Source: Ambulatory Visit | Attending: Internal Medicine | Admitting: Internal Medicine

## 2012-02-03 ENCOUNTER — Encounter (HOSPITAL_COMMUNITY): Payer: Self-pay

## 2012-02-03 VITALS — BP 140/68 | HR 86 | Ht 61.0 in | Wt 167.8 lb

## 2012-02-03 DIAGNOSIS — C50919 Malignant neoplasm of unspecified site of unspecified female breast: Secondary | ICD-10-CM

## 2012-02-03 NOTE — Progress Notes (Signed)
Referring Physician: Dr. Welton Flakes Primary Care: Dr. Sigmund Hazel Primary Cardiologist: None   HPI:  Brandy [name] is a 78 y.o. female with history of HTN, HL, GERD, and depression.  She states she had a stress test at one point in time which was normal.    She was recently diagnosed with stage II invasive ductal carcinoma of the left breast and status post mastectomy with finding of 2 foci of invasive ductal carcinoma both are ER/PR positive but the smaller focus measuring 0.7 cm is HER-2/neu positive with a ratio over 4.  After discussion with Dr. Welton Flakes she will begin treatment with Taxol/herceptin on February 09, 2012 for a year.  She has been referred by Dr. Welton Flakes for follow up in the cardio-onc clinic while on herceptin therapy.  She presents today feeling tired but overall well.  She lives with her husband and great granddaughter and completes all ADLs on her own.  She denies chest pain, orthopnea, PND or edema.    ECHOs: 01/04/12: EF 60-65%, lateral s' 7.9   Review of Systems: [y] = yes, [ ]  = no   General: Weight gain [ ] ; Weight loss [ ] ; Anorexia [ ] ; Fatigue [ ] ; Fever [ ] ; Chills [ ] ; Weakness [ ]   Cardiac: Chest pain/pressure [ ] ; Resting SOB [ ] ; Exertional SOB [ ] ; Orthopnea [ ] ; Pedal Edema [ ] ; Palpitations [ ] ; Syncope [ ] ; Presyncope [ ] ; Paroxysmal nocturnal dyspnea[ ]   Pulmonary: Cough [ ] ; Wheezing[ ] ; Hemoptysis[ ] ; Sputum [ ] ; Snoring [ ]   GI: Vomiting[ ] ; Dysphagia[ ] ; Melena[ ] ; Hematochezia [ ] ; Heartburn[ ] ; Abdominal pain [ ] ; Constipation [ ] ; Diarrhea [ ] ; BRBPR [ ]   GU: Hematuria[ ] ; Dysuria [ ] ; Nocturia[ ]   Vascular: Pain in legs with walking [ ] ; Pain in feet with lying flat [ ] ; Non-healing sores [ ] ; Stroke [ ] ; TIA [ ] ; Slurred speech [ ] ;  Neuro: Headaches[ ] ; Vertigo[ ] ; Seizures[ ] ; Paresthesias[ ] ;Blurred vision [ ] ; Diplopia [ ] ; Vision changes [ ]   Ortho/Skin: Arthritis [ ] ; Joint pain [ ] ; Muscle pain [ ] ; Joint swelling [ ] ; Back Pain [ ] ; Rash [ ]   Psych:  Depression[ ] ; Anxiety[ ]   Heme: Bleeding problems [ ] ; Clotting disorders [ ] ; Anemia [ ]   Endocrine: Diabetes [ ] ; Thyroid dysfunction[ ]    Past Medical History  Diagnosis Date  . Asthma   . Hyperlipidemia     takes Simvastatin daily  . Hypertension     takes Amlodipine and Diovan daily  . Cancer     left breast  . Bronchitis     hx of;last time >52yr ago  . Arthritis     shoulders  . Dysphagia   . H/O hiatal hernia   . GERD (gastroesophageal reflux disease)     takes Prilosec prn  . Urinary urgency   . History of kidney stones     many yrs ago  . Depression     takes Celexa daily  . Insomnia     takes Ambien nightly  . Breast cancer 12/18/11    left breast masectomy=metastatic ca in (1/1) lymph node ,invasive ductal ca,2 foci,,dcis,lymph ovascular invasion identified,surgical resection margins neg for ca,additional tissue=benign skin and subcutaneous tissue  . Breast CA 11/23/11    left breast 3 o'clock bx=high grade ductal ca in situ w/comedononecrosis and calcification,dcis,lymphovascular invasion present ,ER?PR+positive  . Full dentures     Current Outpatient Prescriptions  Medication Sig Dispense Refill  .  amLODipine (NORVASC) 10 MG tablet Take 10 mg by mouth daily.       . citalopram (CELEXA) 20 MG tablet Take 20 mg by mouth daily.      Marland Kitchen dexamethasone (DECADRON) 4 MG tablet Take 2 tablets by mouth daily starting the day after chemotherapy for 2 days. Take with food.  30 tablet  1  . DIOVAN HCT 160-12.5 MG per tablet Take 2 tablets by mouth every morning.       Marland Kitchen HYDROcodone-acetaminophen (NORCO) 5-325 MG per tablet       . LORazepam (ATIVAN) 0.5 MG tablet Take 1 tablet (0.5 mg total) by mouth every 6 (six) hours as needed (Nausea or vomiting).  30 tablet  0  . omeprazole (PRILOSEC) 20 MG capsule Take 20 mg by mouth daily.      . ondansetron (ZOFRAN) 8 MG tablet Take 1 tablet two times a day starting the day after chemo for 2 days. Then take 1 tablet two times a day  as needed for nausea or vomiting.  30 tablet  1  . oxyCODONE (OXY IR/ROXICODONE) 5 MG immediate release tablet       . oxyCODONE-acetaminophen (ROXICET) 5-325 MG per tablet Take 1 tablet by mouth every 4 (four) hours as needed for pain.  20 tablet  0  . prochlorperazine (COMPAZINE) 10 MG tablet Take 1 tablet (10 mg total) by mouth every 6 (six) hours as needed (Nausea or vomiting).  30 tablet  1  . prochlorperazine (COMPAZINE) 25 MG suppository Place 1 suppository (25 mg total) rectally every 12 (twelve) hours as needed for nausea.  12 suppository  3  . simvastatin (ZOCOR) 20 MG tablet Take 20 mg by mouth daily.       Marland Kitchen zolpidem (AMBIEN) 10 MG tablet Take 10 mg by mouth at bedtime.        No Known Allergies  History   Social History  . Marital Status: Married    Spouse Name: N/A    Number of Children: N/A  . Years of Education: N/A   Occupational History  . Not on file.   Social History Main Topics  . Smoking status: Never Smoker   . Smokeless tobacco: Never Used  . Alcohol Use: No  . Drug Use: No  . Sexually Active: Yes    Birth Control/ Protection: Surgical     mensus age 56, 1st pregnancy 45, no hrt gg4,p3, 1 babay lived a few hours complications   Other Topics Concern  . Not on file   Social History Narrative  . No narrative on file    Family History  Problem Relation Age of Onset  . Cancer Father     prostate  . Cancer Other     breast ca    PHYSICAL EXAM: Filed Vitals:   02/03/12 0956  BP: 140/68  Pulse: 86  Height: 5\' 1"  (1.549 m)  Weight: 167 lb 12.8 oz (76.114 kg)  SpO2: 95%    General:  Well appearing. No respiratory difficulty HEENT: normal Neck: supple. JVP 5-6. Carotids 2+ bilat; no bruits. No lymphadenopathy or thryomegaly appreciated. Cor: PMI nondisplaced. Regular rate & rhythm. No rubs, gallops or murmurs.  Port-a-cath wound, healing well Lungs: clear Abdomen: soft, nontender, nondistended. No hepatosplenomegaly. No bruits or masses. Good  bowel sounds. Extremities: no cyanosis, clubbing, rash, edema Neuro: alert & oriented x 3, cranial nerves grossly intact. moves all 4 extremities w/o difficulty. Affect pleasant.    ASSESSMENT & PLAN:

## 2012-02-03 NOTE — Patient Instructions (Addendum)
Follow up 3 months with an echo.  Your physician has requested that you have an echocardiogram. Echocardiography is a painless test that uses sound waves to create images of your heart. It provides your doctor with information about the size and shape of your heart and how well your heart's chambers and valves are working. This procedure takes approximately one hour. There are no restrictions for this procedure.

## 2012-02-03 NOTE — Assessment & Plan Note (Addendum)
Discussed role of Cardio-onc clinic and answered all questions.  Have reviewed echo results.  Ok for herceptin initiation.  Follow echos.     Patient seen and examined with Ulyess Blossom, PA-C. We discussed all aspects of the encounter. I agree with the assessment and plan as stated above. Echo images reviewed personally. All parameters ok. Reviewed signs and symptoms of HF to look for. Ok for Herceptin. Follow-up with echo in 3 months.

## 2012-02-08 ENCOUNTER — Encounter: Payer: Self-pay | Admitting: Oncology

## 2012-02-08 ENCOUNTER — Other Ambulatory Visit: Payer: Self-pay | Admitting: Certified Registered Nurse Anesthetist

## 2012-02-08 ENCOUNTER — Other Ambulatory Visit (HOSPITAL_BASED_OUTPATIENT_CLINIC_OR_DEPARTMENT_OTHER): Payer: Medicare Other | Admitting: Lab

## 2012-02-08 ENCOUNTER — Ambulatory Visit (HOSPITAL_BASED_OUTPATIENT_CLINIC_OR_DEPARTMENT_OTHER): Payer: Medicare Other | Admitting: Oncology

## 2012-02-08 VITALS — BP 150/72 | HR 86 | Temp 98.1°F | Resp 20 | Ht 61.0 in | Wt 169.0 lb

## 2012-02-08 DIAGNOSIS — C50419 Malignant neoplasm of upper-outer quadrant of unspecified female breast: Secondary | ICD-10-CM

## 2012-02-08 DIAGNOSIS — C773 Secondary and unspecified malignant neoplasm of axilla and upper limb lymph nodes: Secondary | ICD-10-CM

## 2012-02-08 DIAGNOSIS — C50919 Malignant neoplasm of unspecified site of unspecified female breast: Secondary | ICD-10-CM

## 2012-02-08 DIAGNOSIS — Z170 Estrogen receptor positive status [ER+]: Secondary | ICD-10-CM

## 2012-02-08 LAB — COMPREHENSIVE METABOLIC PANEL
Albumin: 3.9 g/dL (ref 3.5–5.2)
Alkaline Phosphatase: 72 U/L (ref 39–117)
BUN: 19 mg/dL (ref 6–23)
Calcium: 9.6 mg/dL (ref 8.4–10.5)
Chloride: 103 mEq/L (ref 96–112)
Creatinine, Ser: 0.63 mg/dL (ref 0.50–1.10)
Glucose, Bld: 103 mg/dL — ABNORMAL HIGH (ref 70–99)
Potassium: 3.9 mEq/L (ref 3.5–5.3)

## 2012-02-08 LAB — CBC WITH DIFFERENTIAL/PLATELET
Basophils Absolute: 0.1 10*3/uL (ref 0.0–0.1)
EOS%: 2.9 % (ref 0.0–7.0)
HCT: 34.2 % — ABNORMAL LOW (ref 34.8–46.6)
HGB: 11.2 g/dL — ABNORMAL LOW (ref 11.6–15.9)
LYMPH%: 26.5 % (ref 14.0–49.7)
MCH: 27.3 pg (ref 25.1–34.0)
MCHC: 32.7 g/dL (ref 31.5–36.0)
NEUT%: 62.8 % (ref 38.4–76.8)
Platelets: 261 10*3/uL (ref 145–400)
lymph#: 3.5 10*3/uL — ABNORMAL HIGH (ref 0.9–3.3)

## 2012-02-08 MED ORDER — LIDOCAINE-PRILOCAINE 2.5-2.5 % EX CREA: TOPICAL_CREAM | CUTANEOUS | Status: AC | PRN

## 2012-02-08 MED ORDER — OXYCODONE HCL 5 MG PO TABS: 5.0000 mg | ORAL_TABLET | Freq: Four times a day (QID) | ORAL | Status: DC | PRN

## 2012-02-08 NOTE — Progress Notes (Signed)
OFFICE PROGRESS NOTE  CC  Brandy Labella, MD [address] [place] [address] Dr. Emelia [name] Dr. Antony [name]  DIAGNOSIS:  78 year old female with new diagnosis of multifocal breast cancer of the left breast. Patient is status post mastectomy with sentinel lymph node biopsy performed on 12/18/2011.   PRIOR THERAPY: 1.Patient has had multiple biopsies performed including endoscopic ultrasound guided biopsies as well as an open biopsy without a definitive diagnosis  #2 patient now with new diagnosis of multifocal breast cancer of the left breast. She underwent a mammogram after she felt a mass in the left breast. The mammogram showed an abnormality. As a biopsy was performed and she was found to have an ace invasive ductal carcinoma. She has now had a mastectomy on 12/18/2011. The final pathology revealed 2 foci of well-differentiated invasive ductal carcinoma one measuring 1.7 cm and the second measuring 0.7 cm there was ductal carcinoma in situ lymphovascular invasion was also identified all surgical margins were negative for carcinoma. Patient went on to also have a left sentinel node biopsy one sentinel node was positive for metastatic disease with focal capsular extension. The tumor was estrogen receptor positive progesterone receptor positive. The largest tumor measuring 1.7 cm was HER-2/neu negative with a Ki-67 of 13%. The smaller tumor showed ER +100% PR receptor 7% proliferation marker 50% and it was HER-2/neu positive with a ratio 4.94.  #3 patient has been seen by radiation oncology and post mastectomy radiation therapy is recommended. At this time she will not get a left axillary lymph node dissection.  #4 [name] tumor is HER-2/neu positive and does she will receive adjuvant chemotherapy and Herceptin based therapy.  #5 patient will proceed with combination chemotherapy consisting of Taxol and Herceptin starting on 02/09/2012. A total of 12 weeks of combination  treatment is planned. Once she completes this she will undergo radiation therapy. After which she will receive Herceptin for one year with 5 years of letrozole 2.5 mg daily.  CURRENT THERAPY: Cycle #1 of Taxol and Herceptin to be given on 02/09/2012  INTERVAL HISTORY: Brandy [name] 78 y.o. female returns for followup visit today. Overall she is doing well however she is complaining of some pain at the mastectomy site and I did give her some prescriptions for up OxyIR. She otherwise denies any nausea vomiting fevers chills night sweats no bleeding no peripheral paresthesias. Remainder of the 10 point review of systems is negative. MEDICAL HISTORY: Past Medical History  Diagnosis Date  . Asthma   . Hyperlipidemia     takes Simvastatin daily  . Hypertension     takes Amlodipine and Diovan daily  . Cancer     left breast  . Bronchitis     hx of;last time >48yr ago  . Arthritis     shoulders  . Dysphagia   . H/O hiatal hernia   . GERD (gastroesophageal reflux disease)     takes Prilosec prn  . Urinary urgency   . History of kidney stones     many yrs ago  . Depression     takes Celexa daily  . Insomnia     takes Ambien nightly  . Breast cancer 12/18/11    left breast masectomy=metastatic ca in (1/1) lymph node ,invasive ductal ca,2 foci,,dcis,lymph ovascular invasion identified,surgical resection margins neg for ca,additional tissue=benign skin and subcutaneous tissue  . Breast CA 11/23/11    left breast 3 o'clock bx=high grade ductal ca in situ w/comedononecrosis and calcification,dcis,lymphovascular invasion present ,ER?PR+positive  .  Full dentures     ALLERGIES:   has no known allergies.  MEDICATIONS:  Current Outpatient Prescriptions  Medication Sig Dispense Refill  . amLODipine (NORVASC) 10 MG tablet Take 10 mg by mouth daily.       . citalopram (CELEXA) 20 MG tablet Take 20 mg by mouth daily.      Marland Kitchen dexamethasone (DECADRON) 4 MG tablet Take 2 tablets by mouth daily  starting the day after chemotherapy for 2 days. Take with food.  30 tablet  1  . DIOVAN HCT 160-12.5 MG per tablet Take 2 tablets by mouth every morning.       Marland Kitchen HYDROcodone-acetaminophen (NORCO) 5-325 MG per tablet       . LORazepam (ATIVAN) 0.5 MG tablet Take 1 tablet (0.5 mg total) by mouth every 6 (six) hours as needed (Nausea or vomiting).  30 tablet  0  . omeprazole (PRILOSEC) 20 MG capsule Take 20 mg by mouth daily.      . ondansetron (ZOFRAN) 8 MG tablet Take 1 tablet two times a day starting the day after chemo for 2 days. Then take 1 tablet two times a day as needed for nausea or vomiting.  30 tablet  1  . oxyCODONE (OXY IR/ROXICODONE) 5 MG immediate release tablet       . prochlorperazine (COMPAZINE) 10 MG tablet Take 1 tablet (10 mg total) by mouth every 6 (six) hours as needed (Nausea or vomiting).  30 tablet  1  . prochlorperazine (COMPAZINE) 25 MG suppository Place 1 suppository (25 mg total) rectally every 12 (twelve) hours as needed for nausea.  12 suppository  3  . simvastatin (ZOCOR) 20 MG tablet Take 20 mg by mouth daily.       Marland Kitchen zolpidem (AMBIEN) 10 MG tablet Take 10 mg by mouth at bedtime.        SURGICAL HISTORY:  Past Surgical History  Procedure Date  . Breast biopsy 1998    left  . Total shoulder replacement 2011    left  . Eus 06/04/2011    Procedure: UPPER ENDOSCOPIC ULTRASOUND (EUS) LINEAR;  Surgeon: Rob Bunting, MD;  Location: [facility];  Service: Endoscopy;  Laterality: N/A;  . Exploratory laparotomy      biopsy of intra-abdominal mass  . Bladder tack   . Tubal ligation   . Appendectomy   . Dilation and curettage of uterus   . Esophagogastroduodenoscopy   . Mastectomy w/ sentinel node biopsy 12/18/2011    Procedure: MASTECTOMY WITH SENTINEL LYMPH NODE BIOPSY;  Surgeon: Brandy Loron, MD;  Location: [facility] OR;  Service: General;  Laterality: Left;  . Portacath placement 01/27/2012    Procedure: INSERTION PORT-A-CATH;  Surgeon: Brandy Loron, MD;   Location: [facility];  Service: General;  Laterality: Right;  PORT PLACEMENT    REVIEW OF SYSTEMS:  Pertinent items are noted in HPI.   PHYSICAL EXAMINATION:  Patient is awake alert in no acute distress HEENT exam EOMI PERRLA sclerae anicteric no conjunctival pallor oral mucosa is moist no thrush or mucositis neck is supple no palpable cervical supraclavicular or axillary adenopathy lungs are clear but distant breath sounds no wheezes rales or rhonchi cardiovascular is regular rate rhythm no murmurs gallops or rubs abdomen is soft nontender nondistended bowel sounds are present no active splenomegaly extremities trace edema neuro [name] alert oriented otherwise nonfocal right breast no masses or nipple discharge left mastectomy incisional site looks well he is healing JP drains are in place. There is no evidence  of local infection.  ECOG PERFORMANCE STATUS: 1 - Symptomatic but completely ambulatory  Blood pressure 150/72, pulse 86, temperature 98.1 F (36.7 C), resp. rate 20, height 5\' 1"  (1.549 m), weight 169 lb (76.658 kg).  LABORATORY DATA: Lab Results  Component Value Date   WBC 13.1* 02/08/2012   HGB 11.2* 02/08/2012   HCT 34.2* 02/08/2012   MCV 83.4 02/08/2012   PLT 261 02/08/2012      Chemistry      Component Value Date/Time   NA 138 01/25/2012 1300   K 4.3 01/25/2012 1300   CL 102 01/25/2012 1300   CO2 24 01/25/2012 1300   BUN 19 01/25/2012 1300   CREATININE 0.56 01/25/2012 1300      Component Value Date/Time   CALCIUM 9.7 01/25/2012 1300   ALKPHOS 67 12/28/2011 1156   AST 15 12/28/2011 1156   ALT 13 12/28/2011 1156   BILITOT 0.3 12/28/2011 1156    ADDITIONAL INFORMATION: 2. PROGNOSTIC INDICATORS - ACIS Results IMMUNOHISTOCHEMICAL AND MORPHOMETRIC ANALYSIS BY THE AUTOMATED CELLULAR IMAGING SYSTEM (ACIS) THE SMALLER TUMOR SHOWS THE FOLLOWING BREAST PROGNOSTIC PROFILE: Estrogen Receptor (Negative, <1%): 100%,POSITIVE, STRONG STAINING INTENSITY Progesterone Receptor  (Negative, <1%): 7%,POSITIVE, STRONG STAINING INTENSITY Proliferation Marker Ki67 by M IB-1 (Low<20%): 50% All controls stained appropriately Abigail Miyamoto MD Pathologist, Electronic Signature ( Signed 12/25/2011) 2. CHROMOGENIC IN-SITU HYBRIDIZATION Interpretation: 0.7 CM SMALLER TUMOR: HER2/NEU BY CISH - SHOWS AMPLIFICATION BY CISH ANALYSIS. THE RATIO OF HER2: CEP 17 SIGNALS WAS 4.94. 1.7 CM LEFT SUPERIOR TUMOR: HER-2/NEU BY CISH - NO AMPLIFICATION OF HER-2 DETECTED. THE RATIO OF HER-2: CEP 17 SIGNALS WAS 1.14. Reference range: Ratio: HER2:CEP17 < 1.8 - gene amplification not observed Ratio: HER2:CEP 17 1.8-2.2 - equivocal result Ratio: HER2:CEP17 > 2.2 - gene amplification observed 1 of 4 FINAL for Brandy [name], Brandy [name] [phone]) ADDITIONAL INFORMATION:(continued) Comment: The cancer center was notified on 12-24-2011. Abigail Miyamoto MD Pathologist, Electronic Signature ( Signed 12/24/2011) FINAL DIAGNOSIS Diagnosis 1. Lymph node, sentinel, biopsy, Left - METASTATIC CARCINOMA IN 1 OF 1 LYMPH NODE WITH FOCAL CAPSULAR EXTENSION (1/1). 2. Breast, simple mastectomy, Left - INVASIVE DUCTAL CARCINOMA, TWO FOCI, WELL DIFFERENTIATED, SPANNING 1.7 AND 0.7 CM. - DUCTAL CARCINOMA IN SITU. - LYMPHOVASCULAR INVASION IS IDENTIFIED. - THE SURGICAL RESECTION MARGINS ARE NEGATIVE FOR CARCINOMA. - SEE ONCOLOGY TABLE BELOW. 3. Breast, biopsy, left, additional tissue - BENIGN SKIN AND SUBCUTANEOUS TISSUE. - THERE IS NO EVIDENCE OF MALIGNANCY. Microscopic Comment 2. BREAST, INVASIVE TUMOR, WITH LYMPH NODE SAMPLING Specimen, including laterality: Left breast. Procedure: Simple mastectomy Grade: Both are I Tubule formation: 1 and 1 Nuclear pleomorphism: 1 and 2 Mitotic:1 and 1 Tumor size (gross measurement): 1.7 and 0.7 cm Margins: Negative for carcinoma Invasive, distance to closest margin: 2.0 cm to the deep margin (gross measurement) In-situ, distance to closest margin: 2.0 cm from  the deep margin (gross measurement). Lymphovascular invasion: Present. Ductal carcinoma in situ: Present. Grade: Intermediate grade. Extensive intraductal component: No. Lobular neoplasia: Not identified. Tumor focality: Two foci. Treatment effect: N/A Extent of tumor: Confined to breast parenchyma Lymph nodes: # examined: 1 Lymph nodes with metastasis: 1 Isolated tumor cells (< 0.2 mm): 0 Micrometastasis: (> 0.2 mm and < 2.0 mm): 0 Macrometastasis: (> 2.0 mm): 1 Extracapsular extension: Present, focal. 2 of 4 FINAL for Brandy [name], Brandy [name] [phone]) Microscopic Comment(continued) Breast prognostic profile: [phone] Estrogen receptor: Positive (100%, strong staining intensity). Progesterone receptor: Positive (100%, strong staining intensity. Her 2 neu: No amplification was detected. The ratio was 1.45.  Her 2 neu by CISH will be repeated on both tumors and the results reported separately. Ki-67: 13% Non-neoplastic breast: Healing biopsy site. TNM: mpT1c, pN1a Comments: Grossly, there are two foci of grade I invasive ductal carcinoma present in the simple mastectomy. The larger nodule spans 1.7 cm and is histologically identical to the previous core biopsy, [phone] ("left superior"). The second nodule, 0.7 cm, is grade I invasive ductal carcinoma with extracellular mucin. A complete profile will be performed on this second, smaller nodule. (JBK:gt, 12/22/11) Pecola Leisure MD Pathologist, Electronic Signature   RADIOGRAPHIC STUDIES:  No results found.  ASSESSMENT: 78 year old female with:  #1 new diagnosis of stage II invasive ductal carcinoma of the left breast she is status post mastectomy with finding of 2 foci of invasive ductal carcinoma both are ER/PR positive but the smaller focus measuring 0.7 cm is HER-2/neu positive with a ratio over 4.  #2 postoperatively patient is doing well but she still does have JP drains in the should be removed prior to her starting  her treatment.  #3 the patient and I discussed her diagnosis and rationale for treatment. I do think that she needs some form of systemic adjuvant chemotherapy and Herceptin combination. We had an extensive discussion regarding the rationale for this. Risks and benefits of this were discussed as well.  PLAN:  #1Patient is scheduled to have Taxol and Herceptin on a weekly basis starting 02/09/2012. They understand the risks and benefits.  #2 she will return in one week'[name] time for week #2 of combination. Once she completes 12 weeks of Taxol and Herceptin combination she would then be referred to radiation oncology for radiation therapy.  #3 all questions were answered today. She knows to call me with any problems questions or concerns. I spent 20 minutes counseling the patient face to face. The total time spent in the appointment was 30 minutes.    Drue Second, MD Medical/Oncology [facility] [phone] (beeper) [phone] (Office)  02/08/2012, 12:32 PM

## 2012-02-08 NOTE — Patient Instructions (Addendum)
Return on 8/6 for cycle 1 of herceptin/taxol   I will see you back in 1 week

## 2012-02-09 ENCOUNTER — Ambulatory Visit (HOSPITAL_BASED_OUTPATIENT_CLINIC_OR_DEPARTMENT_OTHER): Payer: Medicare Other

## 2012-02-09 VITALS — BP 119/63 | HR 69 | Temp 98.1°F | Resp 20

## 2012-02-09 DIAGNOSIS — C50419 Malignant neoplasm of upper-outer quadrant of unspecified female breast: Secondary | ICD-10-CM

## 2012-02-09 DIAGNOSIS — C50919 Malignant neoplasm of unspecified site of unspecified female breast: Secondary | ICD-10-CM

## 2012-02-09 DIAGNOSIS — Z5111 Encounter for antineoplastic chemotherapy: Secondary | ICD-10-CM

## 2012-02-09 DIAGNOSIS — C773 Secondary and unspecified malignant neoplasm of axilla and upper limb lymph nodes: Secondary | ICD-10-CM

## 2012-02-09 DIAGNOSIS — Z5112 Encounter for antineoplastic immunotherapy: Secondary | ICD-10-CM

## 2012-02-09 MED ORDER — SODIUM CHLORIDE 0.9 % IV SOLN
Freq: Once | INTRAVENOUS | Status: AC
  Administered 2012-02-09: 11:00:00 via INTRAVENOUS

## 2012-02-09 MED ORDER — ACETAMINOPHEN 325 MG PO TABS
650.0000 mg | ORAL_TABLET | Freq: Once | ORAL | Status: AC
  Administered 2012-02-09: 650 mg via ORAL

## 2012-02-09 MED ORDER — DEXAMETHASONE SODIUM PHOSPHATE 4 MG/ML IJ SOLN
20.0000 mg | Freq: Once | INTRAMUSCULAR | Status: AC
  Administered 2012-02-09: 20 mg via INTRAVENOUS

## 2012-02-09 MED ORDER — PACLITAXEL CHEMO INJECTION 300 MG/50ML
80.0000 mg/m2 | Freq: Once | INTRAVENOUS | Status: AC
  Administered 2012-02-09: 144 mg via INTRAVENOUS
  Filled 2012-02-09: qty 24

## 2012-02-09 MED ORDER — DIPHENHYDRAMINE HCL 50 MG/ML IJ SOLN
50.0000 mg | Freq: Once | INTRAMUSCULAR | Status: AC
  Administered 2012-02-09: 50 mg via INTRAVENOUS

## 2012-02-09 MED ORDER — ONDANSETRON 8 MG/50ML IVPB (CHCC)
8.0000 mg | Freq: Once | INTRAVENOUS | Status: AC
  Administered 2012-02-09: 8 mg via INTRAVENOUS

## 2012-02-09 MED ORDER — HEPARIN SOD (PORK) LOCK FLUSH 100 UNIT/ML IV SOLN
500.0000 [IU] | Freq: Once | INTRAVENOUS | Status: AC | PRN
  Administered 2012-02-09: 500 [IU]
  Filled 2012-02-09: qty 5

## 2012-02-09 MED ORDER — TRASTUZUMAB CHEMO INJECTION 440 MG
4.0000 mg/kg | Freq: Once | INTRAVENOUS | Status: AC
  Administered 2012-02-09: 315 mg via INTRAVENOUS
  Filled 2012-02-09: qty 15

## 2012-02-09 MED ORDER — SODIUM CHLORIDE 0.9 % IJ SOLN
10.0000 mL | INTRAMUSCULAR | Status: DC | PRN
  Administered 2012-02-09: 10 mL
  Filled 2012-02-09: qty 10

## 2012-02-09 MED ORDER — FAMOTIDINE IN NACL 20-0.9 MG/50ML-% IV SOLN
20.0000 mg | Freq: Once | INTRAVENOUS | Status: AC
  Administered 2012-02-09: 20 mg via INTRAVENOUS

## 2012-02-09 NOTE — Patient Instructions (Addendum)
[facility] Discharge Instructions for Patients Receiving Chemotherapy  Today you received the following chemotherapy agents Herceptin and Taxol  To help prevent nausea and vomiting after your treatment, we encourage you to take your nausea medication  Begin taking it at 7 pm and take it as often as prescribed for the next 24 to 72 hours.   If you develop nausea and vomiting that is not controlled by your nausea medication, call the clinic. If it is after clinic hours your family physician or the after hours number for the clinic or go to the Emergency Department.   BELOW ARE SYMPTOMS THAT SHOULD BE REPORTED IMMEDIATELY:  *FEVER GREATER THAN 100.5 F  *CHILLS WITH OR WITHOUT FEVER  NAUSEA AND VOMITING THAT IS NOT CONTROLLED WITH YOUR NAUSEA MEDICATION  *UNUSUAL SHORTNESS OF BREATH  *UNUSUAL BRUISING OR BLEEDING  TENDERNESS IN MOUTH AND THROAT WITH OR WITHOUT PRESENCE OF ULCERS  *URINARY PROBLEMS  *BOWEL PROBLEMS  UNUSUAL RASH Items with * indicate a potential emergency and should be followed up as soon as possible.  One of the nurses will contact you 24 hours after your treatment. Please let the nurse know about any problems that you may have experienced. Feel free to call the clinic you have any questions or concerns. The clinic phone number is [phone].   I have been informed and understand all the instructions given to me. I know to contact the clinic, my physician, or go to the Emergency Department if any problems should occur. I do not have any questions at this time, but understand that I may call the clinic during office hours   should I have any questions or need assistance in obtaining follow up care.    __________________________________________  _____________  __________ Signature of Patient or Authorized Representative            Date                   Time    __________________________________________ Nurse's Signature    

## 2012-02-09 NOTE — Progress Notes (Signed)
Brandy [name] tolerated the Taxol and Herceptin well. She did not have any adverse reaction during the infusion.

## 2012-02-10 ENCOUNTER — Telehealth: Payer: Self-pay | Admitting: *Deleted

## 2012-02-10 NOTE — Telephone Encounter (Signed)
Message copied by Augusto Garbe on Wed Feb 10, 2012 11:30 AM ------      Message from: Melton Krebs D      Created: Tue Feb 09, 2012 11:51 AM      Regarding: 1st time Taxol and Herceptin Treatment (Dr. Welton Flakes)      Contact: [phone]       Brandy [name] received her first treatment of Taxol and Herceptin on 02/09/2012. She is  a patient of Dr. Welton Flakes. Would you please follow up with her to see how she is doing? Thank you.Marland KitchenMarland Kitchen

## 2012-02-10 NOTE — Telephone Encounter (Signed)
Called patient who is doing well.  Denies n/v or any problems with bowels, bladder or pain.  Asked if she is doing well because it's her first treatment.  Reviewed side effects and she does have the handouts on her refigerator as a reference.  Denies further questions at this time.  Asked that she call if any questions.

## 2012-02-12 ENCOUNTER — Other Ambulatory Visit: Payer: Self-pay | Admitting: *Deleted

## 2012-02-12 DIAGNOSIS — C50919 Malignant neoplasm of unspecified site of unspecified female breast: Secondary | ICD-10-CM

## 2012-02-15 ENCOUNTER — Ambulatory Visit (HOSPITAL_BASED_OUTPATIENT_CLINIC_OR_DEPARTMENT_OTHER): Payer: Medicare Other

## 2012-02-15 ENCOUNTER — Other Ambulatory Visit (HOSPITAL_BASED_OUTPATIENT_CLINIC_OR_DEPARTMENT_OTHER): Payer: Medicare Other | Admitting: Lab

## 2012-02-15 ENCOUNTER — Encounter: Payer: Self-pay | Admitting: Oncology

## 2012-02-15 ENCOUNTER — Ambulatory Visit (HOSPITAL_BASED_OUTPATIENT_CLINIC_OR_DEPARTMENT_OTHER): Payer: Medicare Other | Admitting: Oncology

## 2012-02-15 ENCOUNTER — Telehealth: Payer: Self-pay | Admitting: *Deleted

## 2012-02-15 VITALS — BP 132/72 | HR 85 | Temp 98.7°F | Resp 20 | Ht 61.0 in | Wt 170.3 lb

## 2012-02-15 DIAGNOSIS — C50419 Malignant neoplasm of upper-outer quadrant of unspecified female breast: Secondary | ICD-10-CM

## 2012-02-15 DIAGNOSIS — C50919 Malignant neoplasm of unspecified site of unspecified female breast: Secondary | ICD-10-CM

## 2012-02-15 DIAGNOSIS — Z5111 Encounter for antineoplastic chemotherapy: Secondary | ICD-10-CM

## 2012-02-15 DIAGNOSIS — Z170 Estrogen receptor positive status [ER+]: Secondary | ICD-10-CM

## 2012-02-15 LAB — COMPREHENSIVE METABOLIC PANEL
ALT: 29 U/L (ref 0–35)
Albumin: 3.5 g/dL (ref 3.5–5.2)
CO2: 26 mEq/L (ref 19–32)
Potassium: 4.1 mEq/L (ref 3.5–5.3)
Sodium: 135 mEq/L (ref 135–145)
Total Bilirubin: 0.3 mg/dL (ref 0.3–1.2)
Total Protein: 6.1 g/dL (ref 6.0–8.3)

## 2012-02-15 LAB — CBC WITH DIFFERENTIAL/PLATELET
Basophils Absolute: 0 10*3/uL (ref 0.0–0.1)
Eosinophils Absolute: 0.6 10*3/uL — ABNORMAL HIGH (ref 0.0–0.5)
HGB: 10.5 g/dL — ABNORMAL LOW (ref 11.6–15.9)
MONO#: 0.4 10*3/uL (ref 0.1–0.9)
MONO%: 3.7 % (ref 0.0–14.0)
NEUT#: 6.6 10*3/uL — ABNORMAL HIGH (ref 1.5–6.5)
RBC: 3.82 10*6/uL (ref 3.70–5.45)
RDW: 14.1 % (ref 11.2–14.5)
WBC: 10.4 10*3/uL — ABNORMAL HIGH (ref 3.9–10.3)
lymph#: 2.9 10*3/uL (ref 0.9–3.3)
nRBC: 0 % (ref 0–0)

## 2012-02-15 MED ORDER — SODIUM CHLORIDE 0.9 % IV SOLN
Freq: Once | INTRAVENOUS | Status: AC
  Administered 2012-02-15: 14:00:00 via INTRAVENOUS

## 2012-02-15 MED ORDER — SODIUM CHLORIDE 0.9 % IJ SOLN
10.0000 mL | INTRAMUSCULAR | Status: DC | PRN
  Administered 2012-02-15: 10 mL
  Filled 2012-02-15: qty 10

## 2012-02-15 MED ORDER — ONDANSETRON 8 MG/50ML IVPB (CHCC)
8.0000 mg | Freq: Once | INTRAVENOUS | Status: AC
  Administered 2012-02-15: 8 mg via INTRAVENOUS

## 2012-02-15 MED ORDER — ACETAMINOPHEN 325 MG PO TABS
650.0000 mg | ORAL_TABLET | Freq: Once | ORAL | Status: AC
  Administered 2012-02-15: 650 mg via ORAL

## 2012-02-15 MED ORDER — TRASTUZUMAB CHEMO INJECTION 440 MG
2.0000 mg/kg | Freq: Once | INTRAVENOUS | Status: AC
  Administered 2012-02-15: 147 mg via INTRAVENOUS
  Filled 2012-02-15: qty 7

## 2012-02-15 MED ORDER — DEXAMETHASONE SODIUM PHOSPHATE 4 MG/ML IJ SOLN
20.0000 mg | Freq: Once | INTRAMUSCULAR | Status: AC
  Administered 2012-02-15: 20 mg via INTRAVENOUS

## 2012-02-15 MED ORDER — FAMOTIDINE IN NACL 20-0.9 MG/50ML-% IV SOLN
20.0000 mg | Freq: Once | INTRAVENOUS | Status: AC
  Administered 2012-02-15: 20 mg via INTRAVENOUS

## 2012-02-15 MED ORDER — DIPHENHYDRAMINE HCL 50 MG/ML IJ SOLN
50.0000 mg | Freq: Once | INTRAMUSCULAR | Status: AC
  Administered 2012-02-15: 50 mg via INTRAVENOUS

## 2012-02-15 MED ORDER — DEXTROSE 5 % IV SOLN
80.0000 mg/m2 | Freq: Once | INTRAVENOUS | Status: AC
  Administered 2012-02-15: 144 mg via INTRAVENOUS
  Filled 2012-02-15: qty 24

## 2012-02-15 MED ORDER — HEPARIN SOD (PORK) LOCK FLUSH 100 UNIT/ML IV SOLN
500.0000 [IU] | Freq: Once | INTRAVENOUS | Status: AC | PRN
  Administered 2012-02-15: 500 [IU]
  Filled 2012-02-15: qty 5

## 2012-02-15 NOTE — Progress Notes (Signed)
OFFICE PROGRESS NOTE  CC  Brandy [name], [name] [address] [place] [address] Dr. Emelia [name] Dr. Antony [name]  DIAGNOSIS:  78 year old female with new diagnosis of multifocal breast cancer of the left breast. Patient is status post mastectomy with sentinel lymph node biopsy performed on 12/18/2011.   PRIOR THERAPY: 1.Patient has had multiple biopsies performed including endoscopic ultrasound guided biopsies as well as an open biopsy without a definitive diagnosis  #2 patient now with new diagnosis of multifocal breast cancer of the left breast. She underwent a mammogram after she felt a mass in the left breast. The mammogram showed an abnormality. As a biopsy was performed and she was found to have an ace invasive ductal carcinoma. She has now had a mastectomy on 12/18/2011. The final pathology revealed 2 foci of well-differentiated invasive ductal carcinoma one measuring 1.7 cm and the [name] measuring 0.7 cm there was ductal carcinoma in situ lymphovascular invasion was also identified all surgical margins were negative for carcinoma. Patient went on to also have a left sentinel node biopsy one sentinel node was positive for metastatic disease with focal capsular extension. The tumor was estrogen receptor positive progesterone receptor positive. The largest tumor measuring 1.7 cm was HER-2/neu negative with a Ki-67 of 13%. The smaller tumor showed ER +100% PR receptor 7% proliferation marker 50% and it was HER-2/neu positive with a ratio 4.94.  #3 patient has been seen by radiation oncology and post mastectomy radiation therapy is recommended. At this time she will not get a left axillary lymph node dissection.  #4 patient's tumor is HER-2/neu positive and does she will receive adjuvant chemotherapy and Herceptin based therapy.  #5 patient will proceed with combination chemotherapy consisting of Taxol and Herceptin starting on 02/09/2012. A total of 12 weeks of combination  treatment is planned. Once she completes this she will undergo radiation therapy. After which she will receive Herceptin for one year with 5 years of letrozole 2.5 mg daily.  CURRENT THERAPY: Cycle #2 of Taxol and Herceptin   INTERVAL HISTORY: Brandy [name] 78 y.o. female returns for followup visit today.Overall she did well with week #1 of Taxol and Herceptin. She denies any fevers chills night sweats headaches no shortness of breath no chest pains or palpitations no peripheral paresthesias. No nausea or vomiting remainder of the 10 point review of systems is negative.   MEDICAL HISTORY: Past Medical History  Diagnosis Date  . Asthma   . Hyperlipidemia     takes Simvastatin daily  . Hypertension     takes Amlodipine and Diovan daily  . Cancer     left breast  . Bronchitis     hx of;last time >20yr ago  . Arthritis     shoulders  . Dysphagia   . H/O hiatal hernia   . GERD (gastroesophageal reflux disease)     takes Prilosec prn  . Urinary urgency   . History of kidney stones     many yrs ago  . Depression     takes Celexa daily  . Insomnia     takes Ambien nightly  . Breast cancer 12/18/11    left breast masectomy=metastatic ca in (1/1) lymph node ,invasive ductal ca,2 foci,,dcis,lymph ovascular invasion identified,surgical resection margins neg for ca,additional tissue=benign skin and subcutaneous tissue  . Breast CA 11/23/11    left breast 3 o'clock bx=high grade ductal ca in situ w/comedononecrosis and calcification,dcis,lymphovascular invasion present ,ER?PR+positive  . Full dentures     ALLERGIES:  has no known allergies.  MEDICATIONS:  Current Outpatient Prescriptions  Medication Sig Dispense Refill  . amLODipine (NORVASC) 10 MG tablet Take 10 mg by mouth daily.       . citalopram (CELEXA) 20 MG tablet Take 20 mg by mouth daily.      Marland Kitchen dexamethasone (DECADRON) 4 MG tablet Take 2 tablets by mouth daily starting the day after chemotherapy for 2 days. Take with food.   30 tablet  1  . DIOVAN HCT 160-12.5 MG per tablet Take 2 tablets by mouth every morning.       Marland Kitchen HYDROcodone-acetaminophen (NORCO) 5-325 MG per tablet       . lidocaine-prilocaine (EMLA) cream Apply topically as needed.  30 g  4  . LORazepam (ATIVAN) 0.5 MG tablet Take 1 tablet (0.5 mg total) by mouth every 6 (six) hours as needed (Nausea or vomiting).  30 tablet  0  . omeprazole (PRILOSEC) 20 MG capsule Take 20 mg by mouth daily.      . ondansetron (ZOFRAN) 8 MG tablet Take 1 tablet two times a day starting the day after chemo for 2 days. Then take 1 tablet two times a day as needed for nausea or vomiting.  30 tablet  1  . oxyCODONE (OXY IR/ROXICODONE) 5 MG immediate release tablet Take 1 tablet (5 mg total) by mouth every 6 (six) hours as needed for pain.  60 tablet  0  . prochlorperazine (COMPAZINE) 10 MG tablet Take 1 tablet (10 mg total) by mouth every 6 (six) hours as needed (Nausea or vomiting).  30 tablet  1  . prochlorperazine (COMPAZINE) 25 MG suppository Place 1 suppository (25 mg total) rectally every 12 (twelve) hours as needed for nausea.  12 suppository  3  . simvastatin (ZOCOR) 20 MG tablet Take 20 mg by mouth daily.       Marland Kitchen zolpidem (AMBIEN) 10 MG tablet Take 10 mg by mouth at bedtime.        SURGICAL HISTORY:  Past Surgical History  Procedure Date  . Breast biopsy 1998    left  . Total shoulder replacement 2011    left  . Eus 06/04/2011    Procedure: UPPER ENDOSCOPIC ULTRASOUND (EUS) LINEAR;  Surgeon: Brandy Bunting, [name];  Location: [facility];  Service: Endoscopy;  Laterality: N/A;  . Exploratory laparotomy      biopsy of intra-abdominal mass  . Bladder tack   . Tubal ligation   . Appendectomy   . Dilation and curettage of uterus   . Esophagogastroduodenoscopy   . Mastectomy w/ sentinel node biopsy 12/18/2011    Procedure: MASTECTOMY WITH SENTINEL LYMPH NODE BIOPSY;  Surgeon: Brandy Loron, [name];  Location: [facility] OR;  Service: General;  Laterality: Left;  . Portacath  placement 01/27/2012    Procedure: INSERTION PORT-A-CATH;  Surgeon: Brandy Loron, [name];  Location:  SURGERY CENTER;  Service: General;  Laterality: Right;  PORT PLACEMENT    REVIEW OF SYSTEMS:  Pertinent items are noted in HPI.   PHYSICAL EXAMINATION:  Patient is awake alert in no acute distress HEENT exam EOMI PERRLA sclerae anicteric no conjunctival pallor oral mucosa is moist no thrush or mucositis neck is supple no palpable cervical supraclavicular or axillary adenopathy lungs are clear but distant breath sounds no wheezes rales or rhonchi cardiovascular is regular rate rhythm no murmurs gallops or rubs abdomen is soft nontender nondistended bowel sounds are present no active splenomegaly extremities trace edema neuro patient's alert oriented otherwise nonfocal right breast no masses  or nipple discharge left mastectomy incisional site looks well he is healing JP drains are in place. There is no evidence of local infection.  ECOG PERFORMANCE STATUS: 1 - Symptomatic but completely ambulatory  Blood pressure 132/72, pulse 85, temperature 98.7 F (37.1 C), temperature source Oral, resp. rate 20, height 5\' 1"  (1.549 m), weight 170 lb 4.8 oz (77.248 kg).  LABORATORY DATA: Lab Results  Component Value Date   WBC 10.4* 02/15/2012   HGB 10.5* 02/15/2012   HCT 31.8* 02/15/2012   MCV 83.2 02/15/2012   PLT 243 02/15/2012      Chemistry      Component Value Date/Time   NA 139 02/08/2012 1148   K 3.9 02/08/2012 1148   CL 103 02/08/2012 1148   CO2 23 02/08/2012 1148   BUN 19 02/08/2012 1148   CREATININE 0.63 02/08/2012 1148      Component Value Date/Time   CALCIUM 9.6 02/08/2012 1148   ALKPHOS 72 02/08/2012 1148   AST 15 02/08/2012 1148   ALT 15 02/08/2012 1148   BILITOT 0.2* 02/08/2012 1148    ADDITIONAL INFORMATION: 2. PROGNOSTIC INDICATORS - ACIS Results IMMUNOHISTOCHEMICAL AND MORPHOMETRIC ANALYSIS BY THE AUTOMATED CELLULAR IMAGING SYSTEM (ACIS) THE SMALLER TUMOR SHOWS THE FOLLOWING BREAST  PROGNOSTIC PROFILE: Estrogen Receptor (Negative, <1%): 100%,POSITIVE, STRONG STAINING INTENSITY Progesterone Receptor (Negative, <1%): 7%,POSITIVE, STRONG STAINING INTENSITY Proliferation Marker Ki67 by M IB-1 (Low<20%): 50% All controls stained appropriately Brandy [name] Pathologist, Electronic Signature ( Signed 12/25/2011) 2. CHROMOGENIC IN-SITU HYBRIDIZATION Interpretation: 0.7 CM SMALLER TUMOR: HER2/NEU BY CISH - SHOWS AMPLIFICATION BY CISH ANALYSIS. THE RATIO OF HER2: CEP 17 SIGNALS WAS 4.94. 1.7 CM LEFT SUPERIOR TUMOR: HER-2/NEU BY CISH - NO AMPLIFICATION OF HER-2 DETECTED. THE RATIO OF HER-2: CEP 17 SIGNALS WAS 1.14. Reference range: Ratio: HER2:CEP17 < 1.8 - gene amplification not observed Ratio: HER2:CEP 17 1.8-2.2 - equivocal result Ratio: HER2:CEP17 > 2.2 - gene amplification observed 1 of 4 FINAL for Bieker, Maisha S [phone]) ADDITIONAL INFORMATION:(continued) Comment: The cancer center was notified on 12-24-2011. Brandy [name] Pathologist, Electronic Signature ( Signed 12/24/2011) FINAL DIAGNOSIS Diagnosis 1. Lymph node, sentinel, biopsy, Left - METASTATIC CARCINOMA IN 1 OF 1 LYMPH NODE WITH FOCAL CAPSULAR EXTENSION (1/1). 2. Breast, simple mastectomy, Left - INVASIVE DUCTAL CARCINOMA, TWO FOCI, WELL DIFFERENTIATED, SPANNING 1.7 AND 0.7 CM. - DUCTAL CARCINOMA IN SITU. - LYMPHOVASCULAR INVASION IS IDENTIFIED. - THE SURGICAL RESECTION MARGINS ARE NEGATIVE FOR CARCINOMA. - SEE ONCOLOGY TABLE BELOW. 3. Breast, biopsy, left, additional tissue - BENIGN SKIN AND SUBCUTANEOUS TISSUE. - THERE IS NO EVIDENCE OF MALIGNANCY. Microscopic Comment 2. BREAST, INVASIVE TUMOR, WITH LYMPH NODE SAMPLING Specimen, including laterality: Left breast. Procedure: Simple mastectomy Grade: Both are I Tubule formation: 1 and 1 Nuclear pleomorphism: 1 and 2 Mitotic:1 and 1 Tumor size (gross measurement): 1.7 and 0.7 cm Margins: Negative for carcinoma Invasive,  distance to closest margin: 2.0 cm to the deep margin (gross measurement) In-situ, distance to closest margin: 2.0 cm from the deep margin (gross measurement). Lymphovascular invasion: Present. Ductal carcinoma in situ: Present. Grade: Intermediate grade. Extensive intraductal component: No. Lobular neoplasia: Not identified. Tumor focality: Two foci. Treatment effect: N/A Extent of tumor: Confined to breast parenchyma Lymph nodes: # examined: 1 Lymph nodes with metastasis: 1 Isolated tumor cells (< 0.2 mm): 0 Micrometastasis: (> 0.2 mm and < 2.0 mm): 0 Macrometastasis: (> 2.0 mm): 1 Extracapsular extension: Present, focal. 2 of 4 FINAL for Janvrin, Wahneta S [phone]) Microscopic Comment(continued) Breast prognostic profile:  [phone] Estrogen receptor: Positive (100%, strong staining intensity). Progesterone receptor: Positive (100%, strong staining intensity. Her 2 neu: No amplification was detected. The ratio was 1.45. Her 2 neu by CISH will be repeated on both tumors and the results reported separately. Ki-67: 13% Non-neoplastic breast: Healing biopsy site. TNM: mpT1c, pN1a Comments: Grossly, there are two foci of grade I invasive ductal carcinoma present in the simple mastectomy. The larger nodule spans 1.7 cm and is histologically identical to the previous core biopsy, [phone] ("left superior"). The [name] nodule, 0.7 cm, is grade I invasive ductal carcinoma with extracellular mucin. A complete profile will be performed on this [name], smaller nodule. (JBK:gt, 12/22/11) Brandy Leisure [name] Pathologist, Electronic Signature   RADIOGRAPHIC STUDIES:  No results found.  ASSESSMENT: 78 year old female with:  #1 new diagnosis of stage II invasive ductal carcinoma of the left breast she is status post mastectomy with finding of 2 foci of invasive ductal carcinoma both are ER/PR positive but the smaller focus measuring 0.7 cm is HER-2/neu positive with a ratio over  4.  #2 patient was begun on adjuvant chemotherapy and Herceptin. With chemotherapy consisting of Taxol given weekly with weekly Herceptin. She received her first cycle on 02/08/2012.  PLAN: #1 patient is here for week #2 of Taxol Herceptin.  #2 she will return in one week's time for week #3. She knows to call with any problems  I spent >25 minutes counseling the patient face to face. The total time spent in the appointment was 30 minutes.    Brandy Second, [name] Medical/Oncology [facility] [phone] (beeper) [phone] (Office)  02/15/2012, 1:12 PM

## 2012-02-15 NOTE — Patient Instructions (Addendum)
Proceed with chemotherapy.

## 2012-02-15 NOTE — Telephone Encounter (Signed)
Nothing to schedule for the patient has of 02-15-2012

## 2012-02-15 NOTE — Patient Instructions (Addendum)
[facility] Discharge Instructions for Patients Receiving Chemotherapy  Today you received the following chemotherapy agents Taxol and Herceptin  To help prevent nausea and vomiting after your treatment, we encourage you to take your nausea medication  Begin taking it at 7 pm and take it as often as prescribed for the next 24 to 72 hours.   If you develop nausea and vomiting that is not controlled by your nausea medication, call the clinic. If it is after clinic hours your family physician or the after hours number for the clinic or go to the Emergency Department.   BELOW ARE SYMPTOMS THAT SHOULD BE REPORTED IMMEDIATELY:  *FEVER GREATER THAN 100.5 F  *CHILLS WITH OR WITHOUT FEVER  NAUSEA AND VOMITING THAT IS NOT CONTROLLED WITH YOUR NAUSEA MEDICATION  *UNUSUAL SHORTNESS OF BREATH  *UNUSUAL BRUISING OR BLEEDING  TENDERNESS IN MOUTH AND THROAT WITH OR WITHOUT PRESENCE OF ULCERS  *URINARY PROBLEMS  *BOWEL PROBLEMS  UNUSUAL RASH Items with * indicate a potential emergency and should be followed up as soon as possible.  One of the nurses will contact you 24 hours after your treatment. Please let the nurse know about any problems that you may have experienced. Feel free to call the clinic you have any questions or concerns. The clinic phone number is [phone].   I have been informed and understand all the instructions given to me. I know to contact the clinic, my physician, or go to the Emergency Department if any problems should occur. I do not have any questions at this time, but understand that I may call the clinic during office hours   should I have any questions or need assistance in obtaining follow up care.    __________________________________________  _____________  __________ Signature of Patient or Authorized Representative            Date                   Time    __________________________________________ Nurse's Signature

## 2012-02-18 ENCOUNTER — Encounter: Payer: Self-pay | Admitting: *Deleted

## 2012-02-22 ENCOUNTER — Ambulatory Visit (HOSPITAL_BASED_OUTPATIENT_CLINIC_OR_DEPARTMENT_OTHER): Payer: Medicare Other | Admitting: Oncology

## 2012-02-22 ENCOUNTER — Ambulatory Visit (HOSPITAL_BASED_OUTPATIENT_CLINIC_OR_DEPARTMENT_OTHER): Payer: Medicare Other

## 2012-02-22 ENCOUNTER — Other Ambulatory Visit: Payer: Self-pay | Admitting: *Deleted

## 2012-02-22 ENCOUNTER — Other Ambulatory Visit (HOSPITAL_BASED_OUTPATIENT_CLINIC_OR_DEPARTMENT_OTHER): Payer: Medicare Other | Admitting: Lab

## 2012-02-22 ENCOUNTER — Telehealth: Payer: Self-pay | Admitting: *Deleted

## 2012-02-22 ENCOUNTER — Encounter: Payer: Self-pay | Admitting: Oncology

## 2012-02-22 VITALS — BP 147/70 | HR 83 | Temp 98.8°F | Resp 20 | Ht 61.0 in | Wt 172.7 lb

## 2012-02-22 DIAGNOSIS — T451X5A Adverse effect of antineoplastic and immunosuppressive drugs, initial encounter: Secondary | ICD-10-CM

## 2012-02-22 DIAGNOSIS — Z5111 Encounter for antineoplastic chemotherapy: Secondary | ICD-10-CM

## 2012-02-22 DIAGNOSIS — D6481 Anemia due to antineoplastic chemotherapy: Secondary | ICD-10-CM

## 2012-02-22 DIAGNOSIS — C50919 Malignant neoplasm of unspecified site of unspecified female breast: Secondary | ICD-10-CM

## 2012-02-22 DIAGNOSIS — C50419 Malignant neoplasm of upper-outer quadrant of unspecified female breast: Secondary | ICD-10-CM

## 2012-02-22 DIAGNOSIS — Z170 Estrogen receptor positive status [ER+]: Secondary | ICD-10-CM

## 2012-02-22 DIAGNOSIS — Z5112 Encounter for antineoplastic immunotherapy: Secondary | ICD-10-CM

## 2012-02-22 LAB — CBC WITH DIFFERENTIAL/PLATELET
BASO%: 0.4 % (ref 0.0–2.0)
LYMPH%: 38.5 % (ref 14.0–49.7)
MCHC: 32.9 g/dL (ref 31.5–36.0)
MCV: 83.8 fL (ref 79.5–101.0)
MONO#: 0.5 10*3/uL (ref 0.1–0.9)
MONO%: 6.5 % (ref 0.0–14.0)
Platelets: 226 10*3/uL (ref 145–400)
RBC: 3.52 10*6/uL — ABNORMAL LOW (ref 3.70–5.45)
WBC: 7.6 10*3/uL (ref 3.9–10.3)
nRBC: 0 % (ref 0–0)

## 2012-02-22 LAB — COMPREHENSIVE METABOLIC PANEL
ALT: 30 U/L (ref 0–35)
AST: 21 U/L (ref 0–37)
Creatinine, Ser: 0.52 mg/dL (ref 0.50–1.10)
Total Bilirubin: 0.2 mg/dL — ABNORMAL LOW (ref 0.3–1.2)

## 2012-02-22 MED ORDER — DEXAMETHASONE SODIUM PHOSPHATE 4 MG/ML IJ SOLN
20.0000 mg | Freq: Once | INTRAMUSCULAR | Status: AC
  Administered 2012-02-22: 20 mg via INTRAVENOUS

## 2012-02-22 MED ORDER — DIPHENHYDRAMINE HCL 50 MG/ML IJ SOLN
50.0000 mg | Freq: Once | INTRAMUSCULAR | Status: AC
  Administered 2012-02-22: 50 mg via INTRAVENOUS

## 2012-02-22 MED ORDER — ONDANSETRON 8 MG/50ML IVPB (CHCC)
8.0000 mg | Freq: Once | INTRAVENOUS | Status: AC
  Administered 2012-02-22: 8 mg via INTRAVENOUS

## 2012-02-22 MED ORDER — HEPARIN SOD (PORK) LOCK FLUSH 100 UNIT/ML IV SOLN
500.0000 [IU] | Freq: Once | INTRAVENOUS | Status: AC | PRN
  Administered 2012-02-22: 500 [IU]
  Filled 2012-02-22: qty 5

## 2012-02-22 MED ORDER — TRASTUZUMAB CHEMO INJECTION 440 MG
2.0000 mg/kg | Freq: Once | INTRAVENOUS | Status: AC
  Administered 2012-02-22: 147 mg via INTRAVENOUS
  Filled 2012-02-22: qty 7

## 2012-02-22 MED ORDER — SODIUM CHLORIDE 0.9 % IJ SOLN
10.0000 mL | INTRAMUSCULAR | Status: DC | PRN
  Administered 2012-02-22: 10 mL
  Filled 2012-02-22: qty 10

## 2012-02-22 MED ORDER — PACLITAXEL CHEMO INJECTION 300 MG/50ML
80.0000 mg/m2 | Freq: Once | INTRAVENOUS | Status: AC
  Administered 2012-02-22: 144 mg via INTRAVENOUS
  Filled 2012-02-22: qty 24

## 2012-02-22 MED ORDER — FAMOTIDINE IN NACL 20-0.9 MG/50ML-% IV SOLN
20.0000 mg | Freq: Once | INTRAVENOUS | Status: AC
  Administered 2012-02-22: 20 mg via INTRAVENOUS

## 2012-02-22 MED ORDER — ACETAMINOPHEN 325 MG PO TABS
650.0000 mg | ORAL_TABLET | Freq: Once | ORAL | Status: AC
  Administered 2012-02-22: 650 mg via ORAL

## 2012-02-22 MED ORDER — SODIUM CHLORIDE 0.9 % IV SOLN
Freq: Once | INTRAVENOUS | Status: AC
  Administered 2012-02-22: 14:00:00 via INTRAVENOUS

## 2012-02-22 NOTE — Patient Instructions (Signed)
[facility] Discharge Instructions for Patients Receiving Chemotherapy  Today you received the following chemotherapy agents Herceptin and Taxol  To help prevent nausea and vomiting after your treatment, we encourage you to take your nausea medication as prescribed.   If you develop nausea and vomiting that is not controlled by your nausea medication, call the clinic. If it is after clinic hours your family physician or the after hours number for the clinic or go to the Emergency Department.   BELOW ARE SYMPTOMS THAT SHOULD BE REPORTED IMMEDIATELY:  *FEVER GREATER THAN 100.5 F  *CHILLS WITH OR WITHOUT FEVER  NAUSEA AND VOMITING THAT IS NOT CONTROLLED WITH YOUR NAUSEA MEDICATION  *UNUSUAL SHORTNESS OF BREATH  *UNUSUAL BRUISING OR BLEEDING  TENDERNESS IN MOUTH AND THROAT WITH OR WITHOUT PRESENCE OF ULCERS  *URINARY PROBLEMS  *BOWEL PROBLEMS  UNUSUAL RASH Items with * indicate a potential emergency and should be followed up as soon as possible.  One of the nurses will contact you 24 hours after your treatment. Please let the nurse know about any problems that you may have experienced. Feel free to call the clinic you have any questions or concerns. The clinic phone number is [phone].   I have been informed and understand all the instructions given to me. I know to contact the clinic, my physician, or go to the Emergency Department if any problems should occur. I do not have any questions at this time, but understand that I may call the clinic during office hours   should I have any questions or need assistance in obtaining follow up care.    __________________________________________  _____________  __________ Signature of Patient or Authorized Representative            Date                   Time    __________________________________________ Nurse's Signature

## 2012-02-22 NOTE — Telephone Encounter (Signed)
Moved appt from 9/2 to 9/3 due to holiday. JMW

## 2012-02-22 NOTE — Progress Notes (Signed)
OFFICE PROGRESS NOTE  CC  Brandy Labella, MD [address] [place] [address] Dr. Emelia Loron Dr. Antony Blackbird  DIAGNOSIS:  78 year old female with new diagnosis of multifocal breast cancer of the left breast. Patient is status post mastectomy with sentinel lymph node biopsy performed on 12/18/2011.   PRIOR THERAPY: 1.Patient has had multiple biopsies performed including endoscopic ultrasound guided biopsies as well as an open biopsy without a definitive diagnosis  #2 patient now with new diagnosis of multifocal breast cancer of the left breast. She underwent a mammogram after she felt a mass in the left breast. The mammogram showed an abnormality. As a biopsy was performed and she was found to have an ace invasive ductal carcinoma. She has now had a mastectomy on 12/18/2011. The final pathology revealed 2 foci of well-differentiated invasive ductal carcinoma one measuring 1.7 cm and the second measuring 0.7 cm there was ductal carcinoma in situ lymphovascular invasion was also identified all surgical margins were negative for carcinoma. Patient went on to also have a left sentinel node biopsy one sentinel node was positive for metastatic disease with focal capsular extension. The tumor was estrogen receptor positive progesterone receptor positive. The largest tumor measuring 1.7 cm was HER-2/neu negative with a Ki-67 of 13%. The smaller tumor showed ER +100% PR receptor 7% proliferation marker 50% and it was HER-2/neu positive with a ratio 4.94.  #3 patient has been seen by radiation oncology and post mastectomy radiation therapy is recommended. At this time she will not get a left axillary lymph node dissection.  #4 patient's tumor is HER-2/neu positive and does she will receive adjuvant chemotherapy and Herceptin based therapy.  #5 patient will proceed with combination chemotherapy consisting of Taxol and Herceptin starting on 02/09/2012. A total of 12 weeks of combination  treatment is planned. Once she completes this she will undergo radiation therapy. After which she will receive Herceptin for one year with 5 years of letrozole 2.5 mg daily.  CURRENT THERAPY:Cycle #3 of Taxol and Herceptin   INTERVAL HISTORY: Brandy [name] 78 y.o. female returns for followup visit today.She continues to do well. She is due for #3 of Taxol and Herceptin today. She is a little tired. Her hemoglobin is slightly lower than her last visit it is 9.7 today. She has no shortness of breath or chest pains or palpitations. She has no bleeding problems. She has no nausea or vomiting she denies having any peripheral paresthesias. Remainder of the 10 point review of systems is negative.  MEDICAL HISTORY: Past Medical History  Diagnosis Date  . Asthma   . Hyperlipidemia     takes Simvastatin daily  . Hypertension     takes Amlodipine and Diovan daily  . Cancer     left breast  . Bronchitis     hx of;last time >37yr ago  . Arthritis     shoulders  . Dysphagia   . H/O hiatal hernia   . GERD (gastroesophageal reflux disease)     takes Prilosec prn  . Urinary urgency   . History of kidney stones     many yrs ago  . Depression     takes Celexa daily  . Insomnia     takes Ambien nightly  . Breast cancer 12/18/11    left breast masectomy=metastatic ca in (1/1) lymph node ,invasive ductal ca,2 foci,,dcis,lymph ovascular invasion identified,surgical resection margins neg for ca,additional tissue=benign skin and subcutaneous tissue  . Breast CA 11/23/11    left breast  3 o'clock bx=high grade ductal ca in situ w/comedononecrosis and calcification,dcis,lymphovascular invasion present ,ER?PR+positive  . Full dentures     ALLERGIES:   has no known allergies.  MEDICATIONS:  Current Outpatient Prescriptions  Medication Sig Dispense Refill  . amLODipine (NORVASC) 10 MG tablet Take 10 mg by mouth daily.       . citalopram (CELEXA) 20 MG tablet Take 20 mg by mouth daily.      Marland Kitchen  dexamethasone (DECADRON) 4 MG tablet Take 2 tablets by mouth daily starting the day after chemotherapy for 2 days. Take with food.  30 tablet  1  . DIOVAN HCT 160-12.5 MG per tablet Take 2 tablets by mouth every morning.       Marland Kitchen HYDROcodone-acetaminophen (NORCO) 5-325 MG per tablet       . lidocaine-prilocaine (EMLA) cream Apply topically as needed.  30 g  4  . LORazepam (ATIVAN) 0.5 MG tablet Take 1 tablet (0.5 mg total) by mouth every 6 (six) hours as needed (Nausea or vomiting).  30 tablet  0  . omeprazole (PRILOSEC) 20 MG capsule Take 20 mg by mouth daily.      . ondansetron (ZOFRAN) 8 MG tablet Take 1 tablet two times a day starting the day after chemo for 2 days. Then take 1 tablet two times a day as needed for nausea or vomiting.  30 tablet  1  . oxyCODONE (OXY IR/ROXICODONE) 5 MG immediate release tablet Take 1 tablet (5 mg total) by mouth every 6 (six) hours as needed for pain.  60 tablet  0  . prochlorperazine (COMPAZINE) 10 MG tablet Take 1 tablet (10 mg total) by mouth every 6 (six) hours as needed (Nausea or vomiting).  30 tablet  1  . prochlorperazine (COMPAZINE) 25 MG suppository Place 1 suppository (25 mg total) rectally every 12 (twelve) hours as needed for nausea.  12 suppository  3  . simvastatin (ZOCOR) 20 MG tablet Take 20 mg by mouth daily.       Marland Kitchen zolpidem (AMBIEN) 10 MG tablet Take 10 mg by mouth at bedtime.        SURGICAL HISTORY:  Past Surgical History  Procedure Date  . Breast biopsy 1998    left  . Total shoulder replacement 2011    left  . Eus 06/04/2011    Procedure: UPPER ENDOSCOPIC ULTRASOUND (EUS) LINEAR;  Surgeon: Rob Bunting, MD;  Location: [facility];  Service: Endoscopy;  Laterality: N/A;  . Exploratory laparotomy      biopsy of intra-abdominal mass  . Bladder tack   . Tubal ligation   . Appendectomy   . Dilation and curettage of uterus   . Esophagogastroduodenoscopy   . Mastectomy w/ sentinel node biopsy 12/18/2011    Procedure: MASTECTOMY WITH  SENTINEL LYMPH NODE BIOPSY;  Surgeon: Emelia Loron, MD;  Location: [facility] OR;  Service: General;  Laterality: Left;  . Portacath placement 01/27/2012    Procedure: INSERTION PORT-A-CATH;  Surgeon: Emelia Loron, MD;  Location: [facility];  Service: General;  Laterality: Right;  PORT PLACEMENT    REVIEW OF SYSTEMS:  Pertinent items are noted in HPI.   PHYSICAL EXAMINATION:  Patient is awake alert in no acute distress HEENT exam EOMI PERRLA sclerae anicteric no conjunctival pallor oral mucosa is moist no thrush or mucositis neck is supple no palpable cervical supraclavicular or axillary adenopathy lungs are clear but distant breath sounds no wheezes rales or rhonchi cardiovascular is regular rate rhythm no murmurs gallops or rubs  abdomen is soft nontender nondistended bowel sounds are present no active splenomegaly extremities trace edema neuro patient's alert oriented otherwise nonfocal right breast no masses or nipple discharge left mastectomy incisional site looks well he is healing JP drains are in place. There is no evidence of local infection.  ECOG PERFORMANCE STATUS: 1 - Symptomatic but completely ambulatory  Blood pressure 147/70, pulse 83, temperature 98.8 F (37.1 C), temperature source Oral, resp. rate 20, height 5\' 1"  (1.549 m), weight 172 lb 11.2 oz (78.336 kg).  LABORATORY DATA: Lab Results  Component Value Date   WBC 7.6 02/22/2012   HGB 9.7* 02/22/2012   HCT 29.5* 02/22/2012   MCV 83.8 02/22/2012   PLT 226 02/22/2012      Chemistry      Component Value Date/Time   NA 135 02/15/2012 1147   K 4.1 02/15/2012 1147   CL 103 02/15/2012 1147   CO2 26 02/15/2012 1147   BUN 22 02/15/2012 1147   CREATININE 0.60 02/15/2012 1147      Component Value Date/Time   CALCIUM 8.9 02/15/2012 1147   ALKPHOS 54 02/15/2012 1147   AST 23 02/15/2012 1147   ALT 29 02/15/2012 1147   BILITOT 0.3 02/15/2012 1147    ADDITIONAL INFORMATION: 2. PROGNOSTIC INDICATORS -  ACIS Results IMMUNOHISTOCHEMICAL AND MORPHOMETRIC ANALYSIS BY THE AUTOMATED CELLULAR IMAGING SYSTEM (ACIS) THE SMALLER TUMOR SHOWS THE FOLLOWING BREAST PROGNOSTIC PROFILE: Estrogen Receptor (Negative, <1%): 100%,POSITIVE, STRONG STAINING INTENSITY Progesterone Receptor (Negative, <1%): 7%,POSITIVE, STRONG STAINING INTENSITY Proliferation Marker Ki67 by M IB-1 (Low<20%): 50% All controls stained appropriately Abigail Miyamoto MD Pathologist, Electronic Signature ( Signed 12/25/2011) 2. CHROMOGENIC IN-SITU HYBRIDIZATION Interpretation: 0.7 CM SMALLER TUMOR: HER2/NEU BY CISH - SHOWS AMPLIFICATION BY CISH ANALYSIS. THE RATIO OF HER2: CEP 17 SIGNALS WAS 4.94. 1.7 CM LEFT SUPERIOR TUMOR: HER-2/NEU BY CISH - NO AMPLIFICATION OF HER-2 DETECTED. THE RATIO OF HER-2: CEP 17 SIGNALS WAS 1.14. Reference range: Ratio: HER2:CEP17 < 1.8 - gene amplification not observed Ratio: HER2:CEP 17 1.8-2.2 - equivocal result Ratio: HER2:CEP17 > 2.2 - gene amplification observed 1 of 4 FINAL for Talamantez, Breena S [phone]) ADDITIONAL INFORMATION:(continued) Comment: The cancer center was notified on 12-24-2011. Abigail Miyamoto MD Pathologist, Electronic Signature ( Signed 12/24/2011) FINAL DIAGNOSIS Diagnosis 1. Lymph node, sentinel, biopsy, Left - METASTATIC CARCINOMA IN 1 OF 1 LYMPH NODE WITH FOCAL CAPSULAR EXTENSION (1/1). 2. Breast, simple mastectomy, Left - INVASIVE DUCTAL CARCINOMA, TWO FOCI, WELL DIFFERENTIATED, SPANNING 1.7 AND 0.7 CM. - DUCTAL CARCINOMA IN SITU. - LYMPHOVASCULAR INVASION IS IDENTIFIED. - THE SURGICAL RESECTION MARGINS ARE NEGATIVE FOR CARCINOMA. - SEE ONCOLOGY TABLE BELOW. 3. Breast, biopsy, left, additional tissue - BENIGN SKIN AND SUBCUTANEOUS TISSUE. - THERE IS NO EVIDENCE OF MALIGNANCY. Microscopic Comment 2. BREAST, INVASIVE TUMOR, WITH LYMPH NODE SAMPLING Specimen, including laterality: Left breast. Procedure: Simple mastectomy Grade: Both are I Tubule  formation: 1 and 1 Nuclear pleomorphism: 1 and 2 Mitotic:1 and 1 Tumor size (gross measurement): 1.7 and 0.7 cm Margins: Negative for carcinoma Invasive, distance to closest margin: 2.0 cm to the deep margin (gross measurement) In-situ, distance to closest margin: 2.0 cm from the deep margin (gross measurement). Lymphovascular invasion: Present. Ductal carcinoma in situ: Present. Grade: Intermediate grade. Extensive intraductal component: No. Lobular neoplasia: Not identified. Tumor focality: Two foci. Treatment effect: N/A Extent of tumor: Confined to breast parenchyma Lymph nodes: # examined: 1 Lymph nodes with metastasis: 1 Isolated tumor cells (< 0.2 mm): 0 Micrometastasis: (> 0.2 mm and < 2.0  mm): 0 Macrometastasis: (> 2.0 mm): 1 Extracapsular extension: Present, focal. 2 of 4 FINAL for WITNEY, HUIE S [phone]) Microscopic Comment(continued) Breast prognostic profile: [phone] Estrogen receptor: Positive (100%, strong staining intensity). Progesterone receptor: Positive (100%, strong staining intensity. Her 2 neu: No amplification was detected. The ratio was 1.45. Her 2 neu by CISH will be repeated on both tumors and the results reported separately. Ki-67: 13% Non-neoplastic breast: Healing biopsy site. TNM: mpT1c, pN1a Comments: Grossly, there are two foci of grade I invasive ductal carcinoma present in the simple mastectomy. The larger nodule spans 1.7 cm and is histologically identical to the previous core biopsy, [phone] ("left superior"). The second nodule, 0.7 cm, is grade I invasive ductal carcinoma with extracellular mucin. A complete profile will be performed on this second, smaller nodule. (JBK:gt, 12/22/11) Pecola Leisure MD Pathologist, Electronic Signature   RADIOGRAPHIC STUDIES:  No results found.  ASSESSMENT: 78 year old female with:  #1 new diagnosis of stage II invasive ductal carcinoma of the left breast she is status post mastectomy  with finding of 2 foci of invasive ductal carcinoma both are ER/PR positive but the smaller focus measuring 0.7 cm is HER-2/neu positive with a ratio over 4.  #2 patient was begun on adjuvant chemotherapy and Herceptin. With chemotherapy consisting of Taxol given weekly with weekly Herceptin. She received her first cycle on 02/08/2012.  #3 Anemia due to chemotherapy  PLAN: #1 patient is here for week #3 of Taxol Herceptin.  #2 Anemia: monitor may eventually need a transfusion.  #3 she will return in one week's time for week #4. She knows to call with any problems  I spent >25 minutes counseling the patient face to face. The total time spent in the appointment was 30 minutes.    Drue Second, MD Medical/Oncology [facility] [phone] (beeper) [phone] (Office)  02/22/2012, 12:45 PM

## 2012-02-22 NOTE — Patient Instructions (Addendum)
Proceed with scheduled chemotherapy  RTC in 1 week

## 2012-02-26 ENCOUNTER — Encounter (INDEPENDENT_AMBULATORY_CARE_PROVIDER_SITE_OTHER): Payer: Medicare Other | Admitting: General Surgery

## 2012-02-26 ENCOUNTER — Other Ambulatory Visit: Payer: Self-pay | Admitting: *Deleted

## 2012-02-26 DIAGNOSIS — C50919 Malignant neoplasm of unspecified site of unspecified female breast: Secondary | ICD-10-CM

## 2012-02-29 ENCOUNTER — Other Ambulatory Visit (HOSPITAL_BASED_OUTPATIENT_CLINIC_OR_DEPARTMENT_OTHER): Payer: Medicare Other | Admitting: Lab

## 2012-02-29 ENCOUNTER — Ambulatory Visit (HOSPITAL_BASED_OUTPATIENT_CLINIC_OR_DEPARTMENT_OTHER): Payer: Medicare Other

## 2012-02-29 ENCOUNTER — Encounter: Payer: Self-pay | Admitting: Oncology

## 2012-02-29 ENCOUNTER — Ambulatory Visit (HOSPITAL_BASED_OUTPATIENT_CLINIC_OR_DEPARTMENT_OTHER): Payer: Medicare Other | Admitting: Oncology

## 2012-02-29 VITALS — BP 133/69 | HR 98 | Temp 98.5°F | Resp 20 | Ht 61.0 in | Wt 167.6 lb

## 2012-02-29 DIAGNOSIS — C773 Secondary and unspecified malignant neoplasm of axilla and upper limb lymph nodes: Secondary | ICD-10-CM

## 2012-02-29 DIAGNOSIS — C50419 Malignant neoplasm of upper-outer quadrant of unspecified female breast: Secondary | ICD-10-CM

## 2012-02-29 DIAGNOSIS — D6481 Anemia due to antineoplastic chemotherapy: Secondary | ICD-10-CM

## 2012-02-29 DIAGNOSIS — C50919 Malignant neoplasm of unspecified site of unspecified female breast: Secondary | ICD-10-CM

## 2012-02-29 DIAGNOSIS — T451X5A Adverse effect of antineoplastic and immunosuppressive drugs, initial encounter: Secondary | ICD-10-CM

## 2012-02-29 DIAGNOSIS — Z5111 Encounter for antineoplastic chemotherapy: Secondary | ICD-10-CM

## 2012-02-29 LAB — CBC WITH DIFFERENTIAL/PLATELET
BASO%: 0.2 % (ref 0.0–2.0)
MCHC: 33.2 g/dL (ref 31.5–36.0)
MONO#: 0.6 10*3/uL (ref 0.1–0.9)
RBC: 3.96 10*6/uL (ref 3.70–5.45)
WBC: 9.5 10*3/uL (ref 3.9–10.3)
lymph#: 1.5 10*3/uL (ref 0.9–3.3)
nRBC: 0 % (ref 0–0)

## 2012-02-29 LAB — COMPREHENSIVE METABOLIC PANEL (CC13)
AST: 286 U/L — ABNORMAL HIGH (ref 5–34)
Albumin: 3.1 g/dL — ABNORMAL LOW (ref 3.5–5.0)
Chloride: 103 mEq/L (ref 98–107)
Creatinine: 0.7 mg/dL (ref 0.6–1.1)
Potassium: 3.7 mEq/L (ref 3.5–5.1)
Sodium: 139 mEq/L (ref 136–145)
Total Protein: 5.9 g/dL — ABNORMAL LOW (ref 6.4–8.3)

## 2012-02-29 MED ORDER — HEPARIN SOD (PORK) LOCK FLUSH 100 UNIT/ML IV SOLN
500.0000 [IU] | Freq: Once | INTRAVENOUS | Status: AC | PRN
  Administered 2012-02-29: 500 [IU]
  Filled 2012-02-29: qty 5

## 2012-02-29 MED ORDER — ONDANSETRON 8 MG/50ML IVPB (CHCC)
8.0000 mg | Freq: Once | INTRAVENOUS | Status: AC
  Administered 2012-02-29: 8 mg via INTRAVENOUS

## 2012-02-29 MED ORDER — ACETAMINOPHEN 325 MG PO TABS
650.0000 mg | ORAL_TABLET | Freq: Once | ORAL | Status: AC
  Administered 2012-02-29: 650 mg via ORAL

## 2012-02-29 MED ORDER — SODIUM CHLORIDE 0.9 % IJ SOLN
10.0000 mL | INTRAMUSCULAR | Status: DC | PRN
  Administered 2012-02-29: 10 mL
  Filled 2012-02-29: qty 10

## 2012-02-29 MED ORDER — DIPHENHYDRAMINE HCL 50 MG/ML IJ SOLN
50.0000 mg | Freq: Once | INTRAMUSCULAR | Status: AC
  Administered 2012-02-29: 50 mg via INTRAVENOUS

## 2012-02-29 MED ORDER — SODIUM CHLORIDE 0.9 % IV SOLN
Freq: Once | INTRAVENOUS | Status: AC
  Administered 2012-02-29: 14:00:00 via INTRAVENOUS

## 2012-02-29 MED ORDER — PACLITAXEL CHEMO INJECTION 300 MG/50ML
80.0000 mg/m2 | Freq: Once | INTRAVENOUS | Status: AC
  Administered 2012-02-29: 144 mg via INTRAVENOUS
  Filled 2012-02-29: qty 24

## 2012-02-29 MED ORDER — TRASTUZUMAB CHEMO INJECTION 440 MG
2.0000 mg/kg | Freq: Once | INTRAVENOUS | Status: AC
  Administered 2012-02-29: 147 mg via INTRAVENOUS
  Filled 2012-02-29: qty 7

## 2012-02-29 MED ORDER — LORAZEPAM 2 MG/ML IJ SOLN
0.5000 mg | Freq: Once | INTRAMUSCULAR | Status: AC
  Administered 2012-02-29: 0.5 mg via INTRAVENOUS

## 2012-02-29 MED ORDER — DEXAMETHASONE SODIUM PHOSPHATE 4 MG/ML IJ SOLN
20.0000 mg | Freq: Once | INTRAMUSCULAR | Status: AC
  Administered 2012-02-29: 20 mg via INTRAVENOUS

## 2012-02-29 MED ORDER — FAMOTIDINE IN NACL 20-0.9 MG/50ML-% IV SOLN
20.0000 mg | Freq: Once | INTRAVENOUS | Status: AC
  Administered 2012-02-29: 20 mg via INTRAVENOUS

## 2012-02-29 NOTE — Patient Instructions (Addendum)
Doing well we will proceed with chemotherapy today  I will see you back in 1 week

## 2012-02-29 NOTE — Patient Instructions (Signed)
[facility] Discharge Instructions for Patients Receiving Chemotherapy  Today you received the following chemotherapy agents Taxol, Herceptin  To help prevent nausea and vomiting after your treatment, we encourage you to take your nausea medication as directed by MD If you develop nausea and vomiting that is not controlled by your nausea medication, call the clinic. If it is after clinic hours your family physician or the after hours number for the clinic or go to the Emergency Department.   BELOW ARE SYMPTOMS THAT SHOULD BE REPORTED IMMEDIATELY:  *FEVER GREATER THAN 100.5 F  *CHILLS WITH OR WITHOUT FEVER  NAUSEA AND VOMITING THAT IS NOT CONTROLLED WITH YOUR NAUSEA MEDICATION  *UNUSUAL SHORTNESS OF BREATH  *UNUSUAL BRUISING OR BLEEDING  TENDERNESS IN MOUTH AND THROAT WITH OR WITHOUT PRESENCE OF ULCERS  *URINARY PROBLEMS  *BOWEL PROBLEMS  UNUSUAL RASH Items with * indicate a potential emergency and should be followed up as soon as possible.  One of the nurses will contact you 24 hours after your treatment. Please let the nurse know about any problems that you may have experienced. Feel free to call the clinic you have any questions or concerns. The clinic phone number is [phone].   I have been informed and understand all the instructions given to me. I know to contact the clinic, my physician, or go to the Emergency Department if any problems should occur. I do not have any questions at this time, but understand that I may call the clinic during office hours   should I have any questions or need assistance in obtaining follow up care.    __________________________________________  _____________  __________ Signature of Patient or Authorized Representative            Date                   Time    __________________________________________ Nurse's Signature

## 2012-02-29 NOTE — Progress Notes (Signed)
OFFICE PROGRESS NOTE  CC  Brandy Labella, MD [address] [place] [address] Dr. Emelia Loron Dr. Antony Blackbird  DIAGNOSIS:  78 year old female with new diagnosis of multifocal breast cancer of the left breast. Patient is status post mastectomy with sentinel lymph node biopsy performed on 12/18/2011.   PRIOR THERAPY: 1.Patient has had multiple biopsies performed including endoscopic ultrasound guided biopsies as well as an open biopsy without a definitive diagnosis  #2 patient now with new diagnosis of multifocal breast cancer of the left breast. She underwent a mammogram after she felt a mass in the left breast. The mammogram showed an abnormality. As a biopsy was performed and she was found to have an ace invasive ductal carcinoma. She has now had a mastectomy on 12/18/2011. The final pathology revealed 2 foci of well-differentiated invasive ductal carcinoma one measuring 1.7 cm and the second measuring 0.7 cm there was ductal carcinoma in situ lymphovascular invasion was also identified all surgical margins were negative for carcinoma. Patient went on to also have a left sentinel node biopsy one sentinel node was positive for metastatic disease with focal capsular extension. The tumor was estrogen receptor positive progesterone receptor positive. The largest tumor measuring 1.7 cm was HER-2/neu negative with a Ki-67 of 13%. The smaller tumor showed ER +100% PR receptor 7% proliferation marker 50% and it was HER-2/neu positive with a ratio 4.94.  #3 patient has been seen by radiation oncology and post mastectomy radiation therapy is recommended. At this time she will not get a left axillary lymph node dissection.  #4 patient's tumor is HER-2/neu positive and does she will receive adjuvant chemotherapy and Herceptin based therapy.  #5 patient will proceed with combination chemotherapy consisting of Taxol and Herceptin starting on 02/09/2012. A total of 12 weeks of combination  treatment is planned. Once she completes this she will undergo radiation therapy. After which she will receive Herceptin for one year with 5 years of letrozole 2.5 mg daily.  CURRENT THERAPY:Cycle #4 of Taxol and Herceptin   INTERVAL HISTORY: Brandy [name] 78 y.o. female returns for followup visit today.She continues to do well. She is due for #4 of Taxol and Herceptin today. She is a little tired. She has no shortness of breath or chest pains or palpitations. She has no bleeding problems. She has no nausea or vomiting she denies having any peripheral paresthesias. Remainder of the 10 point review of systems is negative.   MEDICAL HISTORY: Past Medical History  Diagnosis Date  . Asthma   . Hyperlipidemia     takes Simvastatin daily  . Hypertension     takes Amlodipine and Diovan daily  . Cancer     left breast  . Bronchitis     hx of;last time >61yr ago  . Arthritis     shoulders  . Dysphagia   . H/O hiatal hernia   . GERD (gastroesophageal reflux disease)     takes Prilosec prn  . Urinary urgency   . History of kidney stones     many yrs ago  . Depression     takes Celexa daily  . Insomnia     takes Ambien nightly  . Breast cancer 12/18/11    left breast masectomy=metastatic ca in (1/1) lymph node ,invasive ductal ca,2 foci,,dcis,lymph ovascular invasion identified,surgical resection margins neg for ca,additional tissue=benign skin and subcutaneous tissue  . Breast CA 11/23/11    left breast 3 o'clock bx=high grade ductal ca in situ w/comedononecrosis and calcification,dcis,lymphovascular invasion  present ,ER?PR+positive  . Full dentures     ALLERGIES:   has no known allergies.  MEDICATIONS:  Current Outpatient Prescriptions  Medication Sig Dispense Refill  . amLODipine (NORVASC) 10 MG tablet Take 10 mg by mouth daily.       . citalopram (CELEXA) 20 MG tablet Take 20 mg by mouth daily.      Marland Kitchen dexamethasone (DECADRON) 4 MG tablet Take 2 tablets by mouth daily starting the  day after chemotherapy for 2 days. Take with food.  30 tablet  1  . DIOVAN HCT 160-12.5 MG per tablet Take 2 tablets by mouth every morning.       Marland Kitchen HYDROcodone-acetaminophen (NORCO) 5-325 MG per tablet       . lidocaine-prilocaine (EMLA) cream Apply topically as needed.  30 g  4  . LORazepam (ATIVAN) 0.5 MG tablet Take 1 tablet (0.5 mg total) by mouth every 6 (six) hours as needed (Nausea or vomiting).  30 tablet  0  . omeprazole (PRILOSEC) 20 MG capsule Take 20 mg by mouth daily.      . ondansetron (ZOFRAN) 8 MG tablet Take 1 tablet two times a day starting the day after chemo for 2 days. Then take 1 tablet two times a day as needed for nausea or vomiting.  30 tablet  1  . oxyCODONE (OXY IR/ROXICODONE) 5 MG immediate release tablet Take 1 tablet (5 mg total) by mouth every 6 (six) hours as needed for pain.  60 tablet  0  . prochlorperazine (COMPAZINE) 10 MG tablet Take 1 tablet (10 mg total) by mouth every 6 (six) hours as needed (Nausea or vomiting).  30 tablet  1  . prochlorperazine (COMPAZINE) 25 MG suppository Place 1 suppository (25 mg total) rectally every 12 (twelve) hours as needed for nausea.  12 suppository  3  . simvastatin (ZOCOR) 20 MG tablet Take 20 mg by mouth daily.       Marland Kitchen zolpidem (AMBIEN) 10 MG tablet Take 10 mg by mouth at bedtime.        SURGICAL HISTORY:  Past Surgical History  Procedure Date  . Breast biopsy 1998    left  . Total shoulder replacement 2011    left  . Eus 06/04/2011    Procedure: UPPER ENDOSCOPIC ULTRASOUND (EUS) LINEAR;  Surgeon: Rob Bunting, MD;  Location: [facility];  Service: Endoscopy;  Laterality: N/A;  . Exploratory laparotomy      biopsy of intra-abdominal mass  . Bladder tack   . Tubal ligation   . Appendectomy   . Dilation and curettage of uterus   . Esophagogastroduodenoscopy   . Mastectomy w/ sentinel node biopsy 12/18/2011    Procedure: MASTECTOMY WITH SENTINEL LYMPH NODE BIOPSY;  Surgeon: Emelia Loron, MD;  Location: [facility] OR;   Service: General;  Laterality: Left;  . Portacath placement 01/27/2012    Procedure: INSERTION PORT-A-CATH;  Surgeon: Emelia Loron, MD;  Location: [facility];  Service: General;  Laterality: Right;  PORT PLACEMENT    REVIEW OF SYSTEMS:  Pertinent items are noted in HPI.   PHYSICAL EXAMINATION:  Patient is awake alert in no acute distress HEENT exam EOMI PERRLA sclerae anicteric no conjunctival pallor oral mucosa is moist no thrush or mucositis neck is supple no palpable cervical supraclavicular or axillary adenopathy lungs are clear but distant breath sounds no wheezes rales or rhonchi cardiovascular is regular rate rhythm no murmurs gallops or rubs abdomen is soft nontender nondistended bowel sounds are present no active splenomegaly  extremities trace edema neuro patient's alert oriented otherwise nonfocal Left mastectomy site clear no evidence of infection or recurrence.Right breast no masses or nipple discharge  ECOG PERFORMANCE STATUS: 1 - Symptomatic but completely ambulatory  Blood pressure 133/69, pulse 98, temperature 98.5 F (36.9 C), temperature source Oral, resp. rate 20, height 5\' 1"  (1.549 m), weight 167 lb 9.6 oz (76.023 kg).  LABORATORY DATA: Lab Results  Component Value Date   WBC 9.5 02/29/2012   HGB 10.8* 02/29/2012   HCT 32.5* 02/29/2012   MCV 82.1 02/29/2012   PLT 249 02/29/2012      Chemistry      Component Value Date/Time   NA 139 02/22/2012 1148   K 4.0 02/22/2012 1148   CL 106 02/22/2012 1148   CO2 27 02/22/2012 1148   BUN 15 02/22/2012 1148   CREATININE 0.52 02/22/2012 1148      Component Value Date/Time   CALCIUM 8.6 02/22/2012 1148   ALKPHOS 59 02/22/2012 1148   AST 21 02/22/2012 1148   ALT 30 02/22/2012 1148   BILITOT 0.2* 02/22/2012 1148    ADDITIONAL INFORMATION: 2. PROGNOSTIC INDICATORS - ACIS Results IMMUNOHISTOCHEMICAL AND MORPHOMETRIC ANALYSIS BY THE AUTOMATED CELLULAR IMAGING SYSTEM (ACIS) THE SMALLER TUMOR SHOWS THE FOLLOWING  BREAST PROGNOSTIC PROFILE: Estrogen Receptor (Negative, <1%): 100%,POSITIVE, STRONG STAINING INTENSITY Progesterone Receptor (Negative, <1%): 7%,POSITIVE, STRONG STAINING INTENSITY Proliferation Marker Ki67 by M IB-1 (Low<20%): 50% All controls stained appropriately Abigail Miyamoto MD Pathologist, Electronic Signature ( Signed 12/25/2011) 2. CHROMOGENIC IN-SITU HYBRIDIZATION Interpretation: 0.7 CM SMALLER TUMOR: HER2/NEU BY CISH - SHOWS AMPLIFICATION BY CISH ANALYSIS. THE RATIO OF HER2: CEP 17 SIGNALS WAS 4.94. 1.7 CM LEFT SUPERIOR TUMOR: HER-2/NEU BY CISH - NO AMPLIFICATION OF HER-2 DETECTED. THE RATIO OF HER-2: CEP 17 SIGNALS WAS 1.14. Reference range: Ratio: HER2:CEP17 < 1.8 - gene amplification not observed Ratio: HER2:CEP 17 1.8-2.2 - equivocal result Ratio: HER2:CEP17 > 2.2 - gene amplification observed 1 of 4 FINAL for Burry, Keyarah S [phone]) ADDITIONAL INFORMATION:(continued) Comment: The cancer center was notified on 12-24-2011. Abigail Miyamoto MD Pathologist, Electronic Signature ( Signed 12/24/2011) FINAL DIAGNOSIS Diagnosis 1. Lymph node, sentinel, biopsy, Left - METASTATIC CARCINOMA IN 1 OF 1 LYMPH NODE WITH FOCAL CAPSULAR EXTENSION (1/1). 2. Breast, simple mastectomy, Left - INVASIVE DUCTAL CARCINOMA, TWO FOCI, WELL DIFFERENTIATED, SPANNING 1.7 AND 0.7 CM. - DUCTAL CARCINOMA IN SITU. - LYMPHOVASCULAR INVASION IS IDENTIFIED. - THE SURGICAL RESECTION MARGINS ARE NEGATIVE FOR CARCINOMA. - SEE ONCOLOGY TABLE BELOW. 3. Breast, biopsy, left, additional tissue - BENIGN SKIN AND SUBCUTANEOUS TISSUE. - THERE IS NO EVIDENCE OF MALIGNANCY. Microscopic Comment 2. BREAST, INVASIVE TUMOR, WITH LYMPH NODE SAMPLING Specimen, including laterality: Left breast. Procedure: Simple mastectomy Grade: Both are I Tubule formation: 1 and 1 Nuclear pleomorphism: 1 and 2 Mitotic:1 and 1 Tumor size (gross measurement): 1.7 and 0.7 cm Margins: Negative for carcinoma Invasive,  distance to closest margin: 2.0 cm to the deep margin (gross measurement) In-situ, distance to closest margin: 2.0 cm from the deep margin (gross measurement). Lymphovascular invasion: Present. Ductal carcinoma in situ: Present. Grade: Intermediate grade. Extensive intraductal component: No. Lobular neoplasia: Not identified. Tumor focality: Two foci. Treatment effect: N/A Extent of tumor: Confined to breast parenchyma Lymph nodes: # examined: 1 Lymph nodes with metastasis: 1 Isolated tumor cells (< 0.2 mm): 0 Micrometastasis: (> 0.2 mm and < 2.0 mm): 0 Macrometastasis: (> 2.0 mm): 1 Extracapsular extension: Present, focal. 2 of 4 FINAL for Acocella, Layonna S [phone]) Microscopic Comment(continued) Breast prognostic  profile: [phone] Estrogen receptor: Positive (100%, strong staining intensity). Progesterone receptor: Positive (100%, strong staining intensity. Her 2 neu: No amplification was detected. The ratio was 1.45. Her 2 neu by CISH will be repeated on both tumors and the results reported separately. Ki-67: 13% Non-neoplastic breast: Healing biopsy site. TNM: mpT1c, pN1a Comments: Grossly, there are two foci of grade I invasive ductal carcinoma present in the simple mastectomy. The larger nodule spans 1.7 cm and is histologically identical to the previous core biopsy, [phone] ("left superior"). The second nodule, 0.7 cm, is grade I invasive ductal carcinoma with extracellular mucin. A complete profile will be performed on this second, smaller nodule. (JBK:gt, 12/22/11) Pecola Leisure MD Pathologist, Electronic Signature   RADIOGRAPHIC STUDIES:  No results found.  ASSESSMENT: 78 year old female with:  #1 new diagnosis of stage II invasive ductal carcinoma of the left breast she is status post mastectomy with finding of 2 foci of invasive ductal carcinoma both are ER/PR positive but the smaller focus measuring 0.7 cm is HER-2/neu positive with a ratio over  4.  #2 patient was begun on adjuvant chemotherapy and Herceptin. With chemotherapy consisting of Taxol given weekly with weekly Herceptin. She received her first cycle on 02/08/2012.  #3 Anemia due to chemotherapy  PLAN: #1 patient is here for week #4 of Taxol Herceptin.  #2 Anemia: improved  #3 she will return in one week's time for week #5. She knows to call with any problems  I spent >25 minutes counseling the patient face to face. The total time spent in the appointment was 30 minutes.    Drue Second, MD Medical/Oncology [facility] [phone] (beeper) [phone] (Office)  02/29/2012, 1:37 PM

## 2012-03-03 ENCOUNTER — Other Ambulatory Visit: Payer: Self-pay | Admitting: Medical Oncology

## 2012-03-03 MED ORDER — OXYCODONE HCL 5 MG PO TABS: 5.0000 mg | ORAL_TABLET | Freq: Four times a day (QID) | ORAL | Status: DC | PRN

## 2012-03-04 ENCOUNTER — Other Ambulatory Visit: Payer: Self-pay | Admitting: Pharmacist

## 2012-03-04 DIAGNOSIS — C50419 Malignant neoplasm of upper-outer quadrant of unspecified female breast: Secondary | ICD-10-CM

## 2012-03-07 ENCOUNTER — Ambulatory Visit: Payer: Medicare Other

## 2012-03-08 ENCOUNTER — Encounter (INDEPENDENT_AMBULATORY_CARE_PROVIDER_SITE_OTHER): Payer: Self-pay | Admitting: General Surgery

## 2012-03-08 ENCOUNTER — Ambulatory Visit: Payer: Medicare Other | Admitting: Oncology

## 2012-03-08 ENCOUNTER — Telehealth: Payer: Self-pay | Admitting: *Deleted

## 2012-03-08 ENCOUNTER — Encounter: Payer: Self-pay | Admitting: Oncology

## 2012-03-08 ENCOUNTER — Other Ambulatory Visit: Payer: Self-pay | Admitting: *Deleted

## 2012-03-08 ENCOUNTER — Ambulatory Visit (HOSPITAL_BASED_OUTPATIENT_CLINIC_OR_DEPARTMENT_OTHER): Payer: Medicare Other | Admitting: Oncology

## 2012-03-08 ENCOUNTER — Ambulatory Visit (HOSPITAL_BASED_OUTPATIENT_CLINIC_OR_DEPARTMENT_OTHER): Payer: Medicare Other | Admitting: Lab

## 2012-03-08 ENCOUNTER — Ambulatory Visit (HOSPITAL_BASED_OUTPATIENT_CLINIC_OR_DEPARTMENT_OTHER): Payer: Medicare Other

## 2012-03-08 ENCOUNTER — Ambulatory Visit (INDEPENDENT_AMBULATORY_CARE_PROVIDER_SITE_OTHER): Payer: Medicare Other | Admitting: General Surgery

## 2012-03-08 VITALS — BP 141/73 | HR 78 | Temp 97.7°F

## 2012-03-08 VITALS — BP 116/56 | HR 68 | Temp 97.2°F | Resp 16 | Ht 61.0 in | Wt 168.2 lb

## 2012-03-08 DIAGNOSIS — C50419 Malignant neoplasm of upper-outer quadrant of unspecified female breast: Secondary | ICD-10-CM

## 2012-03-08 DIAGNOSIS — Z170 Estrogen receptor positive status [ER+]: Secondary | ICD-10-CM

## 2012-03-08 DIAGNOSIS — Z5112 Encounter for antineoplastic immunotherapy: Secondary | ICD-10-CM

## 2012-03-08 DIAGNOSIS — C50919 Malignant neoplasm of unspecified site of unspecified female breast: Secondary | ICD-10-CM

## 2012-03-08 DIAGNOSIS — D6481 Anemia due to antineoplastic chemotherapy: Secondary | ICD-10-CM

## 2012-03-08 DIAGNOSIS — Z5111 Encounter for antineoplastic chemotherapy: Secondary | ICD-10-CM

## 2012-03-08 DIAGNOSIS — Z09 Encounter for follow-up examination after completed treatment for conditions other than malignant neoplasm: Secondary | ICD-10-CM

## 2012-03-08 LAB — CBC WITH DIFFERENTIAL/PLATELET
BASO%: 0.7 % (ref 0.0–2.0)
Eosinophils Absolute: 0.1 10*3/uL (ref 0.0–0.5)
MCHC: 33 g/dL (ref 31.5–36.0)
MONO#: 0.5 10*3/uL (ref 0.1–0.9)
NEUT#: 2 10*3/uL (ref 1.5–6.5)
RBC: 3.61 10*6/uL — ABNORMAL LOW (ref 3.70–5.45)
WBC: 4.5 10*3/uL (ref 3.9–10.3)
lymph#: 1.8 10*3/uL (ref 0.9–3.3)
nRBC: 0 % (ref 0–0)

## 2012-03-08 LAB — COMPREHENSIVE METABOLIC PANEL (CC13)
ALT: 48 U/L (ref 0–55)
AST: 16 U/L (ref 5–34)
Albumin: 2.9 g/dL — ABNORMAL LOW (ref 3.5–5.0)
BUN: 11 mg/dL (ref 7.0–26.0)
CO2: 22 mEq/L (ref 22–29)
Chloride: 104 mEq/L (ref 98–107)
Creatinine: 0.7 mg/dL (ref 0.6–1.1)
Potassium: 3 mEq/L — ABNORMAL LOW (ref 3.5–5.1)

## 2012-03-08 LAB — CORRECTED CALCIUM (CC13): Calcium, Corrected: 10 mg/dL (ref 8.4–10.4)

## 2012-03-08 MED ORDER — SODIUM CHLORIDE 0.9 % IJ SOLN
10.0000 mL | INTRAMUSCULAR | Status: DC | PRN
  Administered 2012-03-08: 10 mL
  Filled 2012-03-08: qty 10

## 2012-03-08 MED ORDER — FAMOTIDINE IN NACL 20-0.9 MG/50ML-% IV SOLN
20.0000 mg | Freq: Once | INTRAVENOUS | Status: AC
  Administered 2012-03-08: 20 mg via INTRAVENOUS

## 2012-03-08 MED ORDER — DIPHENHYDRAMINE HCL 50 MG/ML IJ SOLN
50.0000 mg | Freq: Once | INTRAMUSCULAR | Status: AC
  Administered 2012-03-08: 50 mg via INTRAVENOUS

## 2012-03-08 MED ORDER — TRASTUZUMAB CHEMO INJECTION 440 MG
2.0000 mg/kg | Freq: Once | INTRAVENOUS | Status: AC
  Administered 2012-03-08: 147 mg via INTRAVENOUS
  Filled 2012-03-08: qty 7

## 2012-03-08 MED ORDER — SODIUM CHLORIDE 0.9 % IV SOLN
Freq: Once | INTRAVENOUS | Status: AC
  Administered 2012-03-08: 10:00:00 via INTRAVENOUS

## 2012-03-08 MED ORDER — PACLITAXEL CHEMO INJECTION 300 MG/50ML
80.0000 mg/m2 | Freq: Once | INTRAVENOUS | Status: AC
  Administered 2012-03-08: 144 mg via INTRAVENOUS
  Filled 2012-03-08: qty 24

## 2012-03-08 MED ORDER — DEXAMETHASONE SODIUM PHOSPHATE 4 MG/ML IJ SOLN
20.0000 mg | Freq: Once | INTRAMUSCULAR | Status: AC
  Administered 2012-03-08: 20 mg via INTRAVENOUS

## 2012-03-08 MED ORDER — ONDANSETRON 8 MG/50ML IVPB (CHCC)
8.0000 mg | Freq: Once | INTRAVENOUS | Status: AC
  Administered 2012-03-08: 8 mg via INTRAVENOUS

## 2012-03-08 MED ORDER — HEPARIN SOD (PORK) LOCK FLUSH 100 UNIT/ML IV SOLN
500.0000 [IU] | Freq: Once | INTRAVENOUS | Status: AC | PRN
  Administered 2012-03-08: 500 [IU]
  Filled 2012-03-08: qty 5

## 2012-03-08 MED ORDER — ACETAMINOPHEN 325 MG PO TABS
650.0000 mg | ORAL_TABLET | Freq: Once | ORAL | Status: AC
  Administered 2012-03-08: 650 mg via ORAL

## 2012-03-08 MED ORDER — POTASSIUM CHLORIDE CRYS ER 20 MEQ PO TBCR: 20.0000 meq | EXTENDED_RELEASE_TABLET | Freq: Two times a day (BID) | ORAL | Status: DC

## 2012-03-08 NOTE — Telephone Encounter (Signed)
Per MD, instructed pt to begin K-Dur PO BID x 10 days

## 2012-03-08 NOTE — Patient Instructions (Addendum)
Proceed with chemotherapy today  Return to see Dr. Welton Flakes on 03/14/12 with labs and chemotherapy

## 2012-03-08 NOTE — Progress Notes (Signed)
Pt discharged with support.  Ambulatory with cane.  AVS given.  Pt instructed to call with questions and concerns.  Pt verbalized understanding.  shk

## 2012-03-08 NOTE — Progress Notes (Signed)
Subjective:     Patient ID: Brandy [name], female   DOB: 1933/06/05, 78 y.o.   MRN: [id number]  HPI This is a 67 her a female I know well from a laparotomy as well as a mastectomy. She comes in today after I placed the port on chemotherapy. She is doing well on chemotherapy this through 5 cycles now. She's had no trouble with this. She has had no trouble with her port either.  Review of Systems     Objective:   Physical Exam Port incision healing well without infection    Assessment:     Breast cancer    Plan:     She is doing well and is going to complete her chemotherapy. I will plan on seeing her back in 6 months for her regularly scheduled followup for breast cancer.

## 2012-03-08 NOTE — Progress Notes (Signed)
OFFICE PROGRESS NOTE  CC  [name], MD [address] [place] [address] Dr. Emelia Loron Dr. Antony Blackbird  DIAGNOSIS:  78 year old female with new diagnosis of multifocal breast cancer of the left breast. Patient is status post mastectomy with sentinel lymph node biopsy performed on 12/18/2011.   PRIOR THERAPY: 1.Patient has had multiple biopsies performed including endoscopic ultrasound guided biopsies as well as an open biopsy without a definitive diagnosis  #2 patient now with new diagnosis of multifocal breast cancer of the left breast. She underwent a mammogram after she felt a mass in the left breast. The mammogram showed an abnormality. As a biopsy was performed and she was found to have an ace invasive ductal carcinoma. She has now had a mastectomy on 12/18/2011. The final pathology revealed 2 foci of well-differentiated invasive ductal carcinoma one measuring 1.7 cm and the second measuring 0.7 cm there was ductal carcinoma in situ lymphovascular invasion was also identified all surgical margins were negative for carcinoma. Patient went on to also have a left sentinel node biopsy one sentinel node was positive for metastatic disease with focal capsular extension. The tumor was estrogen receptor positive progesterone receptor positive. The largest tumor measuring 1.7 cm was HER-2/neu negative with a Ki-67 of 13%. The smaller tumor showed ER +100% PR receptor 7% proliferation marker 50% and it was HER-2/neu positive with a ratio 4.94.  #3 patient has been seen by radiation oncology and post mastectomy radiation therapy is recommended. At this time she will not get a left axillary lymph node dissection.  #4 [name] tumor is HER-2/neu positive and does she will receive adjuvant chemotherapy and Herceptin based therapy.  #5 patient will proceed with combination chemotherapy consisting of Taxol and Herceptin starting on 02/09/2012. A total of 12 weeks of combination  treatment is planned. Once she completes this she will undergo radiation therapy. After which she will receive Herceptin for one year with 5 years of letrozole 2.5 mg daily.  CURRENT THERAPY:Cycle 2 day1 of Taxol and Herceptin   INTERVAL HISTORY: Brandy [name] 78 y.o. female returns for followup visit today.She continues to do well. She is due for cycle 2 day 1 of Taxol and Herceptin today. She is a little tired. She has a rash since she sat outside in the sun. Rash mainly in the arms. Nonpruritic She has no shortness of breath or chest pains or palpitations. She has no bleeding problems. She has no nausea or vomiting she denies having any peripheral paresthesias. Remainder of the 10 point review of systems is negative.   MEDICAL HISTORY: Past Medical History  Diagnosis Date  . Asthma   . Hyperlipidemia     takes Simvastatin daily  . Hypertension     takes Amlodipine and Diovan daily  . Cancer     left breast  . Bronchitis     hx of;last time >70yr ago  . Arthritis     shoulders  . Dysphagia   . H/O hiatal hernia   . GERD (gastroesophageal reflux disease)     takes Prilosec prn  . Urinary urgency   . History of kidney stones     many yrs ago  . Depression     takes Celexa daily  . Insomnia     takes Ambien nightly  . Breast cancer 12/18/11    left breast masectomy=metastatic ca in (1/1) lymph node ,invasive ductal ca,2 foci,,dcis,lymph ovascular invasion identified,surgical resection margins neg for ca,additional tissue=benign skin and subcutaneous tissue  .  Breast CA 11/23/11    left breast 3 o'clock bx=high grade ductal ca in situ w/comedononecrosis and calcification,dcis,lymphovascular invasion present ,ER?PR+positive  . Full dentures     ALLERGIES:   has no known allergies.  MEDICATIONS:  Current Outpatient Prescriptions  Medication Sig Dispense Refill  . amLODipine (NORVASC) 10 MG tablet Take 10 mg by mouth daily.       . citalopram (CELEXA) 20 MG tablet Take 20 mg by  mouth daily.      Marland Kitchen dexamethasone (DECADRON) 4 MG tablet Take 2 tablets by mouth daily starting the day after chemotherapy for 2 days. Take with food.  30 tablet  1  . DIOVAN HCT 160-12.5 MG per tablet Take 2 tablets by mouth every morning.       Marland Kitchen HYDROcodone-acetaminophen (NORCO) 5-325 MG per tablet       . lidocaine-prilocaine (EMLA) cream Apply topically as needed.  30 g  4  . LORazepam (ATIVAN) 0.5 MG tablet Take 1 tablet (0.5 mg total) by mouth every 6 (six) hours as needed (Nausea or vomiting).  30 tablet  0  . omeprazole (PRILOSEC) 20 MG capsule Take 20 mg by mouth daily.      . ondansetron (ZOFRAN) 8 MG tablet Take 1 tablet two times a day starting the day after chemo for 2 days. Then take 1 tablet two times a day as needed for nausea or vomiting.  30 tablet  1  . oxyCODONE (OXY IR/ROXICODONE) 5 MG immediate release tablet Take 1 tablet (5 mg total) by mouth every 6 (six) hours as needed for pain.  60 tablet  0  . prochlorperazine (COMPAZINE) 10 MG tablet Take 1 tablet (10 mg total) by mouth every 6 (six) hours as needed (Nausea or vomiting).  30 tablet  1  . prochlorperazine (COMPAZINE) 25 MG suppository Place 1 suppository (25 mg total) rectally every 12 (twelve) hours as needed for nausea.  12 suppository  3  . simvastatin (ZOCOR) 20 MG tablet Take 20 mg by mouth daily.       Marland Kitchen zolpidem (AMBIEN) 10 MG tablet Take 10 mg by mouth at bedtime.        SURGICAL HISTORY:  Past Surgical History  Procedure Date  . Breast biopsy 1998    left  . Total shoulder replacement 2011    left  . Eus 06/04/2011    Procedure: UPPER ENDOSCOPIC ULTRASOUND (EUS) LINEAR;  Surgeon: Rob Bunting, MD;  Location: [facility];  Service: Endoscopy;  Laterality: N/A;  . Exploratory laparotomy      biopsy of intra-abdominal mass  . Bladder tack   . Tubal ligation   . Appendectomy   . Dilation and curettage of uterus   . Esophagogastroduodenoscopy   . Mastectomy w/ sentinel node biopsy 12/18/2011     Procedure: MASTECTOMY WITH SENTINEL LYMPH NODE BIOPSY;  Surgeon: Emelia Loron, MD;  Location: [facility];  Service: General;  Laterality: Left;  . Portacath placement 01/27/2012    Procedure: INSERTION PORT-A-CATH;  Surgeon: Emelia Loron, MD;  Location: [facility];  Service: General;  Laterality: Right;  PORT PLACEMENT    REVIEW OF SYSTEMS:  Pertinent items are noted in HPI.   PHYSICAL EXAMINATION:  Patient is awake alert in no acute distress HEENT exam EOMI PERRLA sclerae anicteric no conjunctival pallor oral mucosa is moist no thrush or mucositis neck is supple no palpable cervical supraclavicular or axillary adenopathy lungs are clear but distant breath sounds no wheezes rales or rhonchi cardiovascular is  regular rate rhythm no murmurs gallops or rubs abdomen is soft nontender nondistended bowel sounds are present no active splenomegaly extremities trace edema neuro [name] alert oriented otherwise nonfocal Left mastectomy site clear no evidence of infection or recurrence.Right breast no masses or nipple discharge  ECOG PERFORMANCE STATUS: 1 - Symptomatic but completely ambulatory  There were no vitals taken for this visit.  LABORATORY DATA: Lab Results  Component Value Date   WBC 4.5 03/08/2012   HGB 9.8* 03/08/2012   HCT 29.7* 03/08/2012   MCV 82.3 03/08/2012   PLT 272 03/08/2012      Chemistry      Component Value Date/Time   NA 138 03/08/2012 0858   NA 139 02/22/2012 1148   K 3.0* 03/08/2012 0858   K 4.0 02/22/2012 1148   CL 104 03/08/2012 0858   CL 106 02/22/2012 1148   CO2 22 03/08/2012 0858   CO2 27 02/22/2012 1148   BUN 11.0 03/08/2012 0858   BUN 15 02/22/2012 1148   CREATININE 0.7 03/08/2012 0858   CREATININE 0.52 02/22/2012 1148      Component Value Date/Time   CALCIUM 8.6 02/22/2012 1148   ALKPHOS 90 03/08/2012 0858   ALKPHOS 59 02/22/2012 1148   AST 16 03/08/2012 0858   AST 21 02/22/2012 1148   ALT 48 03/08/2012 0858   ALT 30 02/22/2012 1148   BILITOT 0.40 03/08/2012 0858    BILITOT 0.2* 02/22/2012 1148    ADDITIONAL INFORMATION: 2. PROGNOSTIC INDICATORS - ACIS Results IMMUNOHISTOCHEMICAL AND MORPHOMETRIC ANALYSIS BY THE AUTOMATED CELLULAR IMAGING SYSTEM (ACIS) THE SMALLER TUMOR SHOWS THE FOLLOWING BREAST PROGNOSTIC PROFILE: Estrogen Receptor (Negative, <1%): 100%,POSITIVE, STRONG STAINING INTENSITY Progesterone Receptor (Negative, <1%): 7%,POSITIVE, STRONG STAINING INTENSITY Proliferation Marker Ki67 by M IB-1 (Low<20%): 50% All controls stained appropriately Abigail Miyamoto MD Pathologist, Electronic Signature ( Signed 12/25/2011) 2. CHROMOGENIC IN-SITU HYBRIDIZATION Interpretation: 0.7 CM SMALLER TUMOR: HER2/NEU BY CISH - SHOWS AMPLIFICATION BY CISH ANALYSIS. THE RATIO OF HER2: CEP 17 SIGNALS WAS 4.94. 1.7 CM LEFT SUPERIOR TUMOR: HER-2/NEU BY CISH - NO AMPLIFICATION OF HER-2 DETECTED. THE RATIO OF HER-2: CEP 17 SIGNALS WAS 1.14. Reference range: Ratio: HER2:CEP17 < 1.8 - gene amplification not observed Ratio: HER2:CEP 17 1.8-2.2 - equivocal result Ratio: HER2:CEP17 > 2.2 - gene amplification observed 1 of 4 FINAL for Brandy [name], Brandy [name] [phone]) ADDITIONAL INFORMATION:(continued) Comment: The cancer center was notified on 12-24-2011. Abigail Miyamoto MD Pathologist, Electronic Signature ( Signed 12/24/2011) FINAL DIAGNOSIS Diagnosis 1. Lymph node, sentinel, biopsy, Left - METASTATIC CARCINOMA IN 1 OF 1 LYMPH NODE WITH FOCAL CAPSULAR EXTENSION (1/1). 2. Breast, simple mastectomy, Left - INVASIVE DUCTAL CARCINOMA, TWO FOCI, WELL DIFFERENTIATED, SPANNING 1.7 AND 0.7 CM. - DUCTAL CARCINOMA IN SITU. - LYMPHOVASCULAR INVASION IS IDENTIFIED. - THE SURGICAL RESECTION MARGINS ARE NEGATIVE FOR CARCINOMA. - SEE ONCOLOGY TABLE BELOW. 3. Breast, biopsy, left, additional tissue - BENIGN SKIN AND SUBCUTANEOUS TISSUE. - THERE IS NO EVIDENCE OF MALIGNANCY. Microscopic Comment 2. BREAST, INVASIVE TUMOR, WITH LYMPH NODE SAMPLING Specimen, including  laterality: Left breast. Procedure: Simple mastectomy Grade: Both are I Tubule formation: 1 and 1 Nuclear pleomorphism: 1 and 2 Mitotic:1 and 1 Tumor size (gross measurement): 1.7 and 0.7 cm Margins: Negative for carcinoma Invasive, distance to closest margin: 2.0 cm to the deep margin (gross measurement) In-situ, distance to closest margin: 2.0 cm from the deep margin (gross measurement). Lymphovascular invasion: Present. Ductal carcinoma in situ: Present. Grade: Intermediate grade. Extensive intraductal component: No. Lobular neoplasia: Not identified. Tumor focality:  Two foci. Treatment effect: N/A Extent of tumor: Confined to breast parenchyma Lymph nodes: # examined: 1 Lymph nodes with metastasis: 1 Isolated tumor cells (< 0.2 mm): 0 Micrometastasis: (> 0.2 mm and < 2.0 mm): 0 Macrometastasis: (> 2.0 mm): 1 Extracapsular extension: Present, focal. 2 of 4 FINAL for Brandy [name], Brandy [name] [phone]) Microscopic Comment(continued) Breast prognostic profile: [phone] Estrogen receptor: Positive (100%, strong staining intensity). Progesterone receptor: Positive (100%, strong staining intensity. Her 2 neu: No amplification was detected. The ratio was 1.45. Her 2 neu by CISH will be repeated on both tumors and the results reported separately. Ki-67: 13% Non-neoplastic breast: Healing biopsy site. TNM: mpT1c, pN1a Comments: Grossly, there are two foci of grade I invasive ductal carcinoma present in the simple mastectomy. The larger nodule spans 1.7 cm and is histologically identical to the previous core biopsy, [id number] ("left superior"). The second nodule, 0.7 cm, is grade I invasive ductal carcinoma with extracellular mucin. A complete profile will be performed on this second, smaller nodule. (JBK:gt, 12/22/11) Pecola Leisure MD Pathologist, Electronic Signature   RADIOGRAPHIC STUDIES:  No results found.  ASSESSMENT: 78 year old female with:  #1 new diagnosis of  stage II invasive ductal carcinoma of the left breast she is status post mastectomy with finding of 2 foci of invasive ductal carcinoma both are ER/PR positive but the smaller focus measuring 0.7 cm is HER-2/neu positive with a ratio over 4.  #2 patient was begun on adjuvant chemotherapy and Herceptin. With chemotherapy consisting of Taxol given weekly with weekly Herceptin. She received her first cycle on 02/08/2012.  #3 Anemia due to chemotherapy  PLAN: #1 patient is here for cycle 2 day 1 of Taxol Herceptin.  #2 Anemia: due to chemotherapy, currently asymptomatic  #3 she will return in one week'[name] time for cycle 2 day 8. She knows to call with any problems  I spent >15 minutes counseling the patient face to face. The total time spent in the appointment was 30 minutes.    Drue Second, MD Medical/Oncology [facility] [phone] (beeper) [phone] (Office)  03/08/2012, 10:11 AM

## 2012-03-08 NOTE — Patient Instructions (Addendum)
[facility] Discharge Instructions for Patients Receiving Chemotherapy  Today you received the following chemotherapy agents ; Taxol and Herceptin.  To help prevent nausea and vomiting after your treatment, we encourage you to take your nausea medication;  Zofran (ondansetron), Compazine (prochloraperzine) and Ativan (lorazepam) as directed for any nausea and or vomiting.   If you develop nausea and vomiting that is not controlled by your nausea medication, call the clinic. If it is after clinic hours your family physician or the after hours number for the clinic or go to the Emergency Department.   BELOW ARE SYMPTOMS THAT SHOULD BE REPORTED IMMEDIATELY:  *FEVER GREATER THAN 100.5 F  *CHILLS WITH OR WITHOUT FEVER  NAUSEA AND VOMITING THAT IS NOT CONTROLLED WITH YOUR NAUSEA MEDICATION  *UNUSUAL SHORTNESS OF BREATH  *UNUSUAL BRUISING OR BLEEDING  TENDERNESS IN MOUTH AND THROAT WITH OR WITHOUT PRESENCE OF ULCERS  *URINARY PROBLEMS  *BOWEL PROBLEMS  UNUSUAL RASH Items with * indicate a potential emergency and should be followed up as soon as possible.  Feel free to call the clinic you have any questions or concerns. The clinic phone number is [phone].   I have been informed and understand all the instructions given to me. I know to contact the clinic, my physician, or go to the Emergency Department if any problems should occur. I do not have any questions at this time, but understand that I may call the clinic during office hours   should I have any questions or need assistance in obtaining follow up care.    __________________________________________  _____________  __________ Signature of Patient or Authorized Representative            Date                   Time    __________________________________________ Nurse's Signature

## 2012-03-08 NOTE — Telephone Encounter (Signed)
Message copied by Cooper Render on Tue Mar 08, 2012  3:22 PM ------      Message from: Brandy [name]      Created: Tue Mar 08, 2012  2:40 PM       Call patient: take K-dur 20 meq po BID x 10 days

## 2012-03-11 ENCOUNTER — Other Ambulatory Visit: Payer: Self-pay | Admitting: Certified Registered Nurse Anesthetist

## 2012-03-14 ENCOUNTER — Ambulatory Visit (HOSPITAL_BASED_OUTPATIENT_CLINIC_OR_DEPARTMENT_OTHER): Payer: Medicare Other | Admitting: Oncology

## 2012-03-14 ENCOUNTER — Other Ambulatory Visit (HOSPITAL_BASED_OUTPATIENT_CLINIC_OR_DEPARTMENT_OTHER): Payer: Medicare Other | Admitting: Lab

## 2012-03-14 ENCOUNTER — Other Ambulatory Visit: Payer: Self-pay | Admitting: Medical Oncology

## 2012-03-14 ENCOUNTER — Encounter: Payer: Self-pay | Admitting: Oncology

## 2012-03-14 ENCOUNTER — Ambulatory Visit (HOSPITAL_BASED_OUTPATIENT_CLINIC_OR_DEPARTMENT_OTHER): Payer: Medicare Other

## 2012-03-14 VITALS — BP 136/73 | HR 94 | Temp 98.6°F | Resp 20 | Ht 61.0 in | Wt 171.0 lb

## 2012-03-14 DIAGNOSIS — C50919 Malignant neoplasm of unspecified site of unspecified female breast: Secondary | ICD-10-CM

## 2012-03-14 DIAGNOSIS — Z5111 Encounter for antineoplastic chemotherapy: Secondary | ICD-10-CM

## 2012-03-14 DIAGNOSIS — C50419 Malignant neoplasm of upper-outer quadrant of unspecified female breast: Secondary | ICD-10-CM

## 2012-03-14 DIAGNOSIS — Z170 Estrogen receptor positive status [ER+]: Secondary | ICD-10-CM

## 2012-03-14 DIAGNOSIS — D6481 Anemia due to antineoplastic chemotherapy: Secondary | ICD-10-CM

## 2012-03-14 LAB — CBC WITH DIFFERENTIAL/PLATELET
Basophils Absolute: 0.1 10*3/uL (ref 0.0–0.1)
Eosinophils Absolute: 0.2 10*3/uL (ref 0.0–0.5)
HCT: 29.2 % — ABNORMAL LOW (ref 34.8–46.6)
HGB: 9.8 g/dL — ABNORMAL LOW (ref 11.6–15.9)
LYMPH%: 31.9 % (ref 14.0–49.7)
MONO#: 0.9 10*3/uL (ref 0.1–0.9)
NEUT#: 3.9 10*3/uL (ref 1.5–6.5)
NEUT%: 52.8 % (ref 38.4–76.8)
Platelets: 288 10*3/uL (ref 145–400)
WBC: 7.5 10*3/uL (ref 3.9–10.3)
lymph#: 2.4 10*3/uL (ref 0.9–3.3)

## 2012-03-14 LAB — COMPREHENSIVE METABOLIC PANEL (CC13)
AST: 16 U/L (ref 5–34)
Albumin: 3 g/dL — ABNORMAL LOW (ref 3.5–5.0)
BUN: 14 mg/dL (ref 7.0–26.0)
CO2: 23 mEq/L (ref 22–29)
Calcium: 8.7 mg/dL (ref 8.4–10.4)
Chloride: 104 mEq/L (ref 98–107)
Creatinine: 0.7 mg/dL (ref 0.6–1.1)
Potassium: 3.4 mEq/L — ABNORMAL LOW (ref 3.5–5.1)

## 2012-03-14 LAB — TECHNOLOGIST REVIEW

## 2012-03-14 MED ORDER — SODIUM CHLORIDE 0.9 % IV SOLN
Freq: Once | INTRAVENOUS | Status: AC
  Administered 2012-03-14: 14:00:00 via INTRAVENOUS

## 2012-03-14 MED ORDER — ACETAMINOPHEN 325 MG PO TABS
650.0000 mg | ORAL_TABLET | Freq: Once | ORAL | Status: AC
  Administered 2012-03-14: 650 mg via ORAL

## 2012-03-14 MED ORDER — HEPARIN SOD (PORK) LOCK FLUSH 100 UNIT/ML IV SOLN
500.0000 [IU] | Freq: Once | INTRAVENOUS | Status: AC | PRN
  Administered 2012-03-14: 500 [IU]
  Filled 2012-03-14: qty 5

## 2012-03-14 MED ORDER — DEXAMETHASONE SODIUM PHOSPHATE 4 MG/ML IJ SOLN
20.0000 mg | Freq: Once | INTRAMUSCULAR | Status: AC
  Administered 2012-03-14: 20 mg via INTRAVENOUS

## 2012-03-14 MED ORDER — ONDANSETRON 8 MG/50ML IVPB (CHCC)
8.0000 mg | Freq: Once | INTRAVENOUS | Status: AC
  Administered 2012-03-14: 8 mg via INTRAVENOUS

## 2012-03-14 MED ORDER — FAMOTIDINE IN NACL 20-0.9 MG/50ML-% IV SOLN
20.0000 mg | Freq: Once | INTRAVENOUS | Status: AC
  Administered 2012-03-14: 20 mg via INTRAVENOUS

## 2012-03-14 MED ORDER — TRASTUZUMAB CHEMO INJECTION 440 MG
2.0000 mg/kg | Freq: Once | INTRAVENOUS | Status: AC
  Administered 2012-03-14: 147 mg via INTRAVENOUS
  Filled 2012-03-14: qty 7

## 2012-03-14 MED ORDER — DIPHENHYDRAMINE HCL 50 MG/ML IJ SOLN
50.0000 mg | Freq: Once | INTRAMUSCULAR | Status: AC
  Administered 2012-03-14: 50 mg via INTRAVENOUS

## 2012-03-14 MED ORDER — SODIUM CHLORIDE 0.9 % IJ SOLN
10.0000 mL | INTRAMUSCULAR | Status: AC | PRN
  Administered 2012-03-14: 10 mL
  Filled 2012-03-14: qty 10

## 2012-03-14 MED ORDER — PACLITAXEL CHEMO INJECTION 300 MG/50ML
80.0000 mg/m2 | Freq: Once | INTRAVENOUS | Status: AC
  Administered 2012-03-14: 144 mg via INTRAVENOUS
  Filled 2012-03-14: qty 24

## 2012-03-14 NOTE — Patient Instructions (Addendum)
[facility] Discharge Instructions for Patients Receiving Chemotherapy  Today you received the following chemotherapy agents: Herceptin, Taxol  To help prevent nausea and vomiting after your treatment, we encourage you to take your nausea medication.  Take it as often as prescribed.    If you develop nausea and vomiting that is not controlled by your nausea medication, call the clinic. If it is after clinic hours your family physician or the after hours number for the clinic or go to the Emergency Department.   BELOW ARE SYMPTOMS THAT SHOULD BE REPORTED IMMEDIATELY:  *FEVER GREATER THAN 100.5 F  *CHILLS WITH OR WITHOUT FEVER  NAUSEA AND VOMITING THAT IS NOT CONTROLLED WITH YOUR NAUSEA MEDICATION  *UNUSUAL SHORTNESS OF BREATH  *UNUSUAL BRUISING OR BLEEDING  TENDERNESS IN MOUTH AND THROAT WITH OR WITHOUT PRESENCE OF ULCERS  *URINARY PROBLEMS  *BOWEL PROBLEMS  UNUSUAL RASH Items with * indicate a potential emergency and should be followed up as soon as possible.  Feel free to call the clinic you have any questions or concerns. The clinic phone number is [phone].   I have been informed and understand all the instructions given to me. I know to contact the clinic, my physician, or go to the Emergency Department if any problems should occur. I do not have any questions at this time, but understand that I may call the clinic during office hours   should I have any questions or need assistance in obtaining follow up care.    __________________________________________  _____________  __________ Signature of Patient or Authorized Representative            Date                   Time    __________________________________________ Nurse's Signature

## 2012-03-14 NOTE — Progress Notes (Signed)
OFFICE PROGRESS NOTE  CC  Brandy Labella, MD [address] [place] [address] Dr. Emelia Loron Dr. Antony Blackbird  DIAGNOSIS:  78 year old female with new diagnosis of multifocal breast cancer of the left breast. Patient is status post mastectomy with sentinel lymph node biopsy performed on 12/18/2011.   PRIOR THERAPY: 1.Patient has had multiple biopsies performed including endoscopic ultrasound guided biopsies as well as an open biopsy without a definitive diagnosis  #2 patient now with new diagnosis of multifocal breast cancer of the left breast. She underwent a mammogram after she felt a mass in the left breast. The mammogram showed an abnormality. As a biopsy was performed and she was found to have an ace invasive ductal carcinoma. She has now had a mastectomy on 12/18/2011. The final pathology revealed 2 foci of well-differentiated invasive ductal carcinoma one measuring 1.7 cm and the second measuring 0.7 cm there was ductal carcinoma in situ lymphovascular invasion was also identified all surgical margins were negative for carcinoma. Patient went on to also have a left sentinel node biopsy one sentinel node was positive for metastatic disease with focal capsular extension. The tumor was estrogen receptor positive progesterone receptor positive. The largest tumor measuring 1.7 cm was HER-2/neu negative with a Ki-67 of 13%. The smaller tumor showed ER +100% PR receptor 7% proliferation marker 50% and it was HER-2/neu positive with a ratio 4.94.  #3 patient has been seen by radiation oncology and post mastectomy radiation therapy is recommended. At this time she will not get a left axillary lymph node dissection.  #4 patient's tumor is HER-2/neu positive and does she will receive adjuvant chemotherapy and Herceptin based therapy.  #5 patient will proceed with combination chemotherapy consisting of Taxol and Herceptin starting on 02/09/2012. A total of 12 weeks of combination  treatment is planned. Once she completes this she will undergo radiation therapy. After which she will receive Herceptin for one year with 5 years of letrozole 2.5 mg daily.  CURRENT THERAPY:Cycle 2 day 8 of Taxol and Herceptin   INTERVAL HISTORY: Brandy [name] 78 y.o. female returns for followup visit today.She continues to do well. She is due for cycle 2 day 8 of Taxol and Herceptin today. She is a little tired. She has a rash since she sat outside in the sun. Rash mainly in the arms. Nonpruritic She has no shortness of breath or chest pains or palpitations. She has no bleeding problems. She has no nausea or vomiting she denies having any peripheral paresthesias. Remainder of the 10 point review of systems is negative.   MEDICAL HISTORY: Past Medical History  Diagnosis Date  . Asthma   . Hyperlipidemia     takes Simvastatin daily  . Hypertension     takes Amlodipine and Diovan daily  . Cancer     left breast  . Bronchitis     hx of;last time >79yr ago  . Arthritis     shoulders  . Dysphagia   . H/O hiatal hernia   . GERD (gastroesophageal reflux disease)     takes Prilosec prn  . Urinary urgency   . History of kidney stones     many yrs ago  . Depression     takes Celexa daily  . Insomnia     takes Ambien nightly  . Breast cancer 12/18/11    left breast masectomy=metastatic ca in (1/1) lymph node ,invasive ductal ca,2 foci,,dcis,lymph ovascular invasion identified,surgical resection margins neg for ca,additional tissue=benign skin and subcutaneous tissue  .  Breast CA 11/23/11    left breast 3 o'clock bx=high grade ductal ca in situ w/comedononecrosis and calcification,dcis,lymphovascular invasion present ,ER?PR+positive  . Full dentures     ALLERGIES:   has no known allergies.  MEDICATIONS:  Current Outpatient Prescriptions  Medication Sig Dispense Refill  . amLODipine (NORVASC) 10 MG tablet Take 10 mg by mouth daily.       . citalopram (CELEXA) 20 MG tablet Take 20 mg  by mouth daily.      Marland Kitchen dexamethasone (DECADRON) 4 MG tablet Take 2 tablets by mouth daily starting the day after chemotherapy for 2 days. Take with food.  30 tablet  1  . DIOVAN HCT 160-12.5 MG per tablet Take 2 tablets by mouth every morning.       Marland Kitchen HYDROcodone-acetaminophen (NORCO) 5-325 MG per tablet       . lidocaine-prilocaine (EMLA) cream Apply topically as needed.  30 g  4  . LORazepam (ATIVAN) 0.5 MG tablet Take 1 tablet (0.5 mg total) by mouth every 6 (six) hours as needed (Nausea or vomiting).  30 tablet  0  . omeprazole (PRILOSEC) 20 MG capsule Take 20 mg by mouth daily.      . ondansetron (ZOFRAN) 8 MG tablet Take 1 tablet two times a day starting the day after chemo for 2 days. Then take 1 tablet two times a day as needed for nausea or vomiting.  30 tablet  1  . oxyCODONE (OXY IR/ROXICODONE) 5 MG immediate release tablet Take 1 tablet (5 mg total) by mouth every 6 (six) hours as needed for pain.  60 tablet  0  . potassium chloride SA (K-DUR,KLOR-CON) 20 MEQ tablet Take 1 tablet (20 mEq total) by mouth 2 (two) times daily.  20 tablet  0  . prochlorperazine (COMPAZINE) 10 MG tablet Take 1 tablet (10 mg total) by mouth every 6 (six) hours as needed (Nausea or vomiting).  30 tablet  1  . prochlorperazine (COMPAZINE) 25 MG suppository Place 1 suppository (25 mg total) rectally every 12 (twelve) hours as needed for nausea.  12 suppository  3  . simvastatin (ZOCOR) 20 MG tablet Take 20 mg by mouth daily.       Marland Kitchen zolpidem (AMBIEN) 10 MG tablet Take 10 mg by mouth at bedtime.        SURGICAL HISTORY:  Past Surgical History  Procedure Date  . Breast biopsy 1998    left  . Total shoulder replacement 2011    left  . Eus 06/04/2011    Procedure: UPPER ENDOSCOPIC ULTRASOUND (EUS) LINEAR;  Surgeon: Rob Bunting, MD;  Location: [facility];  Service: Endoscopy;  Laterality: N/A;  . Exploratory laparotomy      biopsy of intra-abdominal mass  . Bladder tack   . Tubal ligation   .  Appendectomy   . Dilation and curettage of uterus   . Esophagogastroduodenoscopy   . Mastectomy w/ sentinel node biopsy 12/18/2011    Procedure: MASTECTOMY WITH SENTINEL LYMPH NODE BIOPSY;  Surgeon: Emelia Loron, MD;  Location: [facility] OR;  Service: General;  Laterality: Left;  . Portacath placement 01/27/2012    Procedure: INSERTION PORT-A-CATH;  Surgeon: Emelia Loron, MD;  Location: [facility];  Service: General;  Laterality: Right;  PORT PLACEMENT    REVIEW OF SYSTEMS:  Pertinent items are noted in HPI.   PHYSICAL EXAMINATION:  Patient is awake alert in no acute distress HEENT exam EOMI PERRLA sclerae anicteric no conjunctival pallor oral mucosa is moist no thrush  or mucositis neck is supple no palpable cervical supraclavicular or axillary adenopathy lungs are clear but distant breath sounds no wheezes rales or rhonchi cardiovascular is regular rate rhythm no murmurs gallops or rubs abdomen is soft nontender nondistended bowel sounds are present no active splenomegaly extremities trace edema neuro patient's alert oriented otherwise nonfocal Left mastectomy site clear no evidence of infection or recurrence.Right breast no masses or nipple discharge  ECOG PERFORMANCE STATUS: 1 - Symptomatic but completely ambulatory  Blood pressure 136/73, pulse 94, temperature 98.6 F (37 C), temperature source Oral, resp. rate 20, height 5\' 1"  (1.549 m), weight 171 lb (77.565 kg).  LABORATORY DATA: Lab Results  Component Value Date   WBC 4.5 03/08/2012   HGB 9.8* 03/08/2012   HCT 29.7* 03/08/2012   MCV 82.3 03/08/2012   PLT 272 03/08/2012      Chemistry      Component Value Date/Time   NA 138 03/08/2012 0858   NA 139 02/22/2012 1148   K 3.0* 03/08/2012 0858   K 4.0 02/22/2012 1148   CL 104 03/08/2012 0858   CL 106 02/22/2012 1148   CO2 22 03/08/2012 0858   CO2 27 02/22/2012 1148   BUN 11.0 03/08/2012 0858   BUN 15 02/22/2012 1148   CREATININE 0.7 03/08/2012 0858   CREATININE 0.52 02/22/2012 1148        Component Value Date/Time   CALCIUM 8.6 02/22/2012 1148   ALKPHOS 90 03/08/2012 0858   ALKPHOS 59 02/22/2012 1148   AST 16 03/08/2012 0858   AST 21 02/22/2012 1148   ALT 48 03/08/2012 0858   ALT 30 02/22/2012 1148   BILITOT 0.40 03/08/2012 0858   BILITOT 0.2* 02/22/2012 1148    ADDITIONAL INFORMATION: 2. PROGNOSTIC INDICATORS - ACIS Results IMMUNOHISTOCHEMICAL AND MORPHOMETRIC ANALYSIS BY THE AUTOMATED CELLULAR IMAGING SYSTEM (ACIS) THE SMALLER TUMOR SHOWS THE FOLLOWING BREAST PROGNOSTIC PROFILE: Estrogen Receptor (Negative, <1%): 100%,POSITIVE, STRONG STAINING INTENSITY Progesterone Receptor (Negative, <1%): 7%,POSITIVE, STRONG STAINING INTENSITY Proliferation Marker Ki67 by M IB-1 (Low<20%): 50% All controls stained appropriately Abigail Miyamoto MD Pathologist, Electronic Signature ( Signed 12/25/2011) 2. CHROMOGENIC IN-SITU HYBRIDIZATION Interpretation: 0.7 CM SMALLER TUMOR: HER2/NEU BY CISH - SHOWS AMPLIFICATION BY CISH ANALYSIS. THE RATIO OF HER2: CEP 17 SIGNALS WAS 4.94. 1.7 CM LEFT SUPERIOR TUMOR: HER-2/NEU BY CISH - NO AMPLIFICATION OF HER-2 DETECTED. THE RATIO OF HER-2: CEP 17 SIGNALS WAS 1.14. Reference range: Ratio: HER2:CEP17 < 1.8 - gene amplification not observed Ratio: HER2:CEP 17 1.8-2.2 - equivocal result Ratio: HER2:CEP17 > 2.2 - gene amplification observed 1 of 4 FINAL for Hoard, Meerab S [phone]) ADDITIONAL INFORMATION:(continued) Comment: The cancer center was notified on 12-24-2011. Abigail Miyamoto MD Pathologist, Electronic Signature ( Signed 12/24/2011) FINAL DIAGNOSIS Diagnosis 1. Lymph node, sentinel, biopsy, Left - METASTATIC CARCINOMA IN 1 OF 1 LYMPH NODE WITH FOCAL CAPSULAR EXTENSION (1/1). 2. Breast, simple mastectomy, Left - INVASIVE DUCTAL CARCINOMA, TWO FOCI, WELL DIFFERENTIATED, SPANNING 1.7 AND 0.7 CM. - DUCTAL CARCINOMA IN SITU. - LYMPHOVASCULAR INVASION IS IDENTIFIED. - THE SURGICAL RESECTION MARGINS ARE NEGATIVE FOR CARCINOMA. -  SEE ONCOLOGY TABLE BELOW. 3. Breast, biopsy, left, additional tissue - BENIGN SKIN AND SUBCUTANEOUS TISSUE. - THERE IS NO EVIDENCE OF MALIGNANCY. Microscopic Comment 2. BREAST, INVASIVE TUMOR, WITH LYMPH NODE SAMPLING Specimen, including laterality: Left breast. Procedure: Simple mastectomy Grade: Both are I Tubule formation: 1 and 1 Nuclear pleomorphism: 1 and 2 Mitotic:1 and 1 Tumor size (gross measurement): 1.7 and 0.7 cm Margins: Negative for carcinoma Invasive, distance  to closest margin: 2.0 cm to the deep margin (gross measurement) In-situ, distance to closest margin: 2.0 cm from the deep margin (gross measurement). Lymphovascular invasion: Present. Ductal carcinoma in situ: Present. Grade: Intermediate grade. Extensive intraductal component: No. Lobular neoplasia: Not identified. Tumor focality: Two foci. Treatment effect: N/A Extent of tumor: Confined to breast parenchyma Lymph nodes: # examined: 1 Lymph nodes with metastasis: 1 Isolated tumor cells (< 0.2 mm): 0 Micrometastasis: (> 0.2 mm and < 2.0 mm): 0 Macrometastasis: (> 2.0 mm): 1 Extracapsular extension: Present, focal. 2 of 4 FINAL for Trussell, Britteny S [phone]) Microscopic Comment(continued) Breast prognostic profile: [phone] Estrogen receptor: Positive (100%, strong staining intensity). Progesterone receptor: Positive (100%, strong staining intensity. Her 2 neu: No amplification was detected. The ratio was 1.45. Her 2 neu by CISH will be repeated on both tumors and the results reported separately. Ki-67: 13% Non-neoplastic breast: Healing biopsy site. TNM: mpT1c, pN1a Comments: Grossly, there are two foci of grade I invasive ductal carcinoma present in the simple mastectomy. The larger nodule spans 1.7 cm and is histologically identical to the previous core biopsy, [phone] ("left superior"). The second nodule, 0.7 cm, is grade I invasive ductal carcinoma with extracellular mucin. A  complete profile will be performed on this second, smaller nodule. (JBK:gt, 12/22/11) Pecola Leisure MD Pathologist, Electronic Signature   RADIOGRAPHIC STUDIES:  No results found.  ASSESSMENT: 78 year old female with:  #1 new diagnosis of stage II invasive ductal carcinoma of the left breast she is status post mastectomy with finding of 2 foci of invasive ductal carcinoma both are ER/PR positive but the smaller focus measuring 0.7 cm is HER-2/neu positive with a ratio over 4.  #2 patient was begun on adjuvant chemotherapy and Herceptin. With chemotherapy consisting of Taxol given weekly with weekly Herceptin. She received her first cycle on 02/08/2012.  #3 Anemia due to chemotherapy  PLAN: #1 patient is here for cycle 2 day 8 of Taxol Herceptin.  #2 Anemia: due to chemotherapy, currently asymptomatic  #3 she will return in one week's time for cycle 2 day 15. She knows to call with any problems  I spent >25 minutes counseling the patient face to face. The total time spent in the appointment was 30 minutes.    Drue Second, MD Medical/Oncology [facility] [phone] (beeper) [phone] (Office)  03/14/2012, 12:41 PM

## 2012-03-14 NOTE — Patient Instructions (Addendum)
Proceed with chemotherapy today  I will see you back in 1 week 

## 2012-03-15 ENCOUNTER — Telehealth: Payer: Self-pay | Admitting: *Deleted

## 2012-03-15 NOTE — Telephone Encounter (Signed)
9/16, 9/23, 9/30, 10/7  Add on lab and md for the above dates  Patient aware of the above lab and md and treatment

## 2012-03-21 ENCOUNTER — Encounter: Payer: Self-pay | Admitting: Oncology

## 2012-03-21 ENCOUNTER — Ambulatory Visit (HOSPITAL_BASED_OUTPATIENT_CLINIC_OR_DEPARTMENT_OTHER): Payer: Medicare Other

## 2012-03-21 ENCOUNTER — Ambulatory Visit (HOSPITAL_BASED_OUTPATIENT_CLINIC_OR_DEPARTMENT_OTHER): Payer: Medicare Other | Admitting: Oncology

## 2012-03-21 ENCOUNTER — Other Ambulatory Visit: Payer: Self-pay | Admitting: *Deleted

## 2012-03-21 ENCOUNTER — Other Ambulatory Visit (HOSPITAL_BASED_OUTPATIENT_CLINIC_OR_DEPARTMENT_OTHER): Payer: Medicare Other | Admitting: Lab

## 2012-03-21 VITALS — BP 136/75 | HR 111 | Temp 98.4°F | Resp 20 | Ht 61.0 in | Wt 166.5 lb

## 2012-03-21 DIAGNOSIS — T451X5A Adverse effect of antineoplastic and immunosuppressive drugs, initial encounter: Secondary | ICD-10-CM

## 2012-03-21 DIAGNOSIS — C50419 Malignant neoplasm of upper-outer quadrant of unspecified female breast: Secondary | ICD-10-CM

## 2012-03-21 DIAGNOSIS — C50919 Malignant neoplasm of unspecified site of unspecified female breast: Secondary | ICD-10-CM

## 2012-03-21 DIAGNOSIS — D6481 Anemia due to antineoplastic chemotherapy: Secondary | ICD-10-CM

## 2012-03-21 DIAGNOSIS — Z5111 Encounter for antineoplastic chemotherapy: Secondary | ICD-10-CM

## 2012-03-21 DIAGNOSIS — Z170 Estrogen receptor positive status [ER+]: Secondary | ICD-10-CM

## 2012-03-21 DIAGNOSIS — C773 Secondary and unspecified malignant neoplasm of axilla and upper limb lymph nodes: Secondary | ICD-10-CM

## 2012-03-21 DIAGNOSIS — Z5112 Encounter for antineoplastic immunotherapy: Secondary | ICD-10-CM

## 2012-03-21 LAB — CBC WITH DIFFERENTIAL/PLATELET
Basophils Absolute: 0 10*3/uL (ref 0.0–0.1)
Eosinophils Absolute: 0.1 10*3/uL (ref 0.0–0.5)
HCT: 31.8 % — ABNORMAL LOW (ref 34.8–46.6)
HGB: 10.5 g/dL — ABNORMAL LOW (ref 11.6–15.9)
LYMPH%: 30.2 % (ref 14.0–49.7)
MCV: 83.9 fL (ref 79.5–101.0)
MONO#: 0.7 10*3/uL (ref 0.1–0.9)
MONO%: 10.7 % (ref 0.0–14.0)
NEUT#: 3.6 10*3/uL (ref 1.5–6.5)
Platelets: 272 10*3/uL (ref 145–400)
WBC: 6.3 10*3/uL (ref 3.9–10.3)

## 2012-03-21 LAB — COMPREHENSIVE METABOLIC PANEL (CC13)
Albumin: 3.3 g/dL — ABNORMAL LOW (ref 3.5–5.0)
Alkaline Phosphatase: 82 U/L (ref 40–150)
BUN: 13 mg/dL (ref 7.0–26.0)
CO2: 23 mEq/L (ref 22–29)
Glucose: 115 mg/dl — ABNORMAL HIGH (ref 70–99)
Total Bilirubin: 0.5 mg/dL (ref 0.20–1.20)
Total Protein: 6.4 g/dL (ref 6.4–8.3)

## 2012-03-21 LAB — TECHNOLOGIST REVIEW

## 2012-03-21 MED ORDER — FAMOTIDINE IN NACL 20-0.9 MG/50ML-% IV SOLN
20.0000 mg | Freq: Once | INTRAVENOUS | Status: AC
  Administered 2012-03-21: 20 mg via INTRAVENOUS

## 2012-03-21 MED ORDER — TRASTUZUMAB CHEMO INJECTION 440 MG
2.0000 mg/kg | Freq: Once | INTRAVENOUS | Status: AC
  Administered 2012-03-21: 147 mg via INTRAVENOUS
  Filled 2012-03-21: qty 7

## 2012-03-21 MED ORDER — PACLITAXEL CHEMO INJECTION 300 MG/50ML
80.0000 mg/m2 | Freq: Once | INTRAVENOUS | Status: AC
  Administered 2012-03-21: 144 mg via INTRAVENOUS
  Filled 2012-03-21: qty 24

## 2012-03-21 MED ORDER — SODIUM CHLORIDE 0.9 % IV SOLN
Freq: Once | INTRAVENOUS | Status: AC
  Administered 2012-03-21: 14:00:00 via INTRAVENOUS

## 2012-03-21 MED ORDER — HEPARIN SOD (PORK) LOCK FLUSH 100 UNIT/ML IV SOLN
500.0000 [IU] | Freq: Once | INTRAVENOUS | Status: AC | PRN
  Administered 2012-03-21: 500 [IU]
  Filled 2012-03-21: qty 5

## 2012-03-21 MED ORDER — ACETAMINOPHEN 325 MG PO TABS
650.0000 mg | ORAL_TABLET | Freq: Once | ORAL | Status: AC
  Administered 2012-03-21: 650 mg via ORAL

## 2012-03-21 MED ORDER — SODIUM CHLORIDE 0.9 % IJ SOLN
10.0000 mL | INTRAMUSCULAR | Status: DC | PRN
  Administered 2012-03-21: 10 mL
  Filled 2012-03-21: qty 10

## 2012-03-21 MED ORDER — DIPHENHYDRAMINE HCL 50 MG/ML IJ SOLN
50.0000 mg | Freq: Once | INTRAMUSCULAR | Status: AC
  Administered 2012-03-21: 50 mg via INTRAVENOUS

## 2012-03-21 MED ORDER — ONDANSETRON 8 MG/50ML IVPB (CHCC)
8.0000 mg | Freq: Once | INTRAVENOUS | Status: AC
  Administered 2012-03-21: 8 mg via INTRAVENOUS

## 2012-03-21 MED ORDER — DEXAMETHASONE SODIUM PHOSPHATE 4 MG/ML IJ SOLN
20.0000 mg | Freq: Once | INTRAMUSCULAR | Status: AC
  Administered 2012-03-21: 20 mg via INTRAVENOUS

## 2012-03-21 NOTE — Progress Notes (Signed)
OFFICE PROGRESS NOTE  CC  Brandy Labella, MD [address] [place] [address] Dr. Emelia Loron Dr. Antony Blackbird  DIAGNOSIS:  78 year old female with new diagnosis of multifocal breast cancer of the left breast. Patient is status post mastectomy with sentinel lymph node biopsy performed on 12/18/2011.   PRIOR THERAPY: 1.Patient has had multiple biopsies performed including endoscopic ultrasound guided biopsies as well as an open biopsy without a definitive diagnosis  #2 patient now with new diagnosis of multifocal breast cancer of the left breast. She underwent a mammogram after she felt a mass in the left breast. The mammogram showed an abnormality. As a biopsy was performed and she was found to have an ace invasive ductal carcinoma. She has now had a mastectomy on 12/18/2011. The final pathology revealed 2 foci of well-differentiated invasive ductal carcinoma one measuring 1.7 cm and the second measuring 0.7 cm there was ductal carcinoma in situ lymphovascular invasion was also identified all surgical margins were negative for carcinoma. Patient went on to also have a left sentinel node biopsy one sentinel node was positive for metastatic disease with focal capsular extension. The tumor was estrogen receptor positive progesterone receptor positive. The largest tumor measuring 1.7 cm was HER-2/neu negative with a Ki-67 of 13%. The smaller tumor showed ER +100% PR receptor 7% proliferation marker 50% and it was HER-2/neu positive with a ratio 4.94.  #3 patient has been seen by radiation oncology and post mastectomy radiation therapy is recommended. At this time she will not get a left axillary lymph node dissection.  #4 patient's tumor is HER-2/neu positive and does she will receive adjuvant chemotherapy and Herceptin based therapy.  #5 patient will proceed with combination chemotherapy consisting of Taxol and Herceptin starting on 02/09/2012. A total of 12 weeks of combination  treatment is planned. Once she completes this she will undergo radiation therapy. After which she will receive Herceptin for one year with 5 years of letrozole 2.5 mg daily.  CURRENT THERAPY:Cycle 2 day 15 of Taxol and Herceptin   INTERVAL HISTORY: Brandy [name] 78 y.o. female returns for followup visit today.She continues to do well. She is due for cycle 2 day 15 of Taxol and Herceptin today. She is a little tired. She has a rash since she sat outside in the sun. Rash mainly in the arms. Nonpruritic She has no shortness of breath or chest pains or palpitations. She has no bleeding problems. She has no nausea or vomiting she denies having any peripheral paresthesias. Remainder of the 10 point review of systems is negative.   MEDICAL HISTORY: Past Medical History  Diagnosis Date  . Asthma   . Hyperlipidemia     takes Simvastatin daily  . Hypertension     takes Amlodipine and Diovan daily  . Cancer     left breast  . Bronchitis     hx of;last time >2yr ago  . Arthritis     shoulders  . Dysphagia   . H/O hiatal hernia   . GERD (gastroesophageal reflux disease)     takes Prilosec prn  . Urinary urgency   . History of kidney stones     many yrs ago  . Depression     takes Celexa daily  . Insomnia     takes Ambien nightly  . Breast cancer 12/18/11    left breast masectomy=metastatic ca in (1/1) lymph node ,invasive ductal ca,2 foci,,dcis,lymph ovascular invasion identified,surgical resection margins neg for ca,additional tissue=benign skin and subcutaneous tissue  .  Breast CA 11/23/11    left breast 3 o'clock bx=high grade ductal ca in situ w/comedononecrosis and calcification,dcis,lymphovascular invasion present ,ER?PR+positive  . Full dentures     ALLERGIES:   has no known allergies.  MEDICATIONS:  Current Outpatient Prescriptions  Medication Sig Dispense Refill  . amLODipine (NORVASC) 10 MG tablet Take 10 mg by mouth daily.       . citalopram (CELEXA) 20 MG tablet Take 20 mg  by mouth daily.      Marland Kitchen dexamethasone (DECADRON) 4 MG tablet Take 2 tablets by mouth daily starting the day after chemotherapy for 2 days. Take with food.  30 tablet  1  . DIOVAN HCT 160-12.5 MG per tablet Take 2 tablets by mouth every morning.       Marland Kitchen HYDROcodone-acetaminophen (NORCO) 5-325 MG per tablet       . lidocaine-prilocaine (EMLA) cream Apply topically as needed.  30 g  4  . LORazepam (ATIVAN) 0.5 MG tablet Take 1 tablet (0.5 mg total) by mouth every 6 (six) hours as needed (Nausea or vomiting).  30 tablet  0  . omeprazole (PRILOSEC) 20 MG capsule Take 20 mg by mouth daily.      . ondansetron (ZOFRAN) 8 MG tablet Take 1 tablet two times a day starting the day after chemo for 2 days. Then take 1 tablet two times a day as needed for nausea or vomiting.  30 tablet  1  . oxyCODONE (OXY IR/ROXICODONE) 5 MG immediate release tablet Take 1 tablet (5 mg total) by mouth every 6 (six) hours as needed for pain.  60 tablet  0  . potassium chloride SA (K-DUR,KLOR-CON) 20 MEQ tablet Take 1 tablet (20 mEq total) by mouth 2 (two) times daily.  20 tablet  0  . prochlorperazine (COMPAZINE) 10 MG tablet Take 1 tablet (10 mg total) by mouth every 6 (six) hours as needed (Nausea or vomiting).  30 tablet  1  . prochlorperazine (COMPAZINE) 25 MG suppository Place 1 suppository (25 mg total) rectally every 12 (twelve) hours as needed for nausea.  12 suppository  3  . simvastatin (ZOCOR) 20 MG tablet Take 20 mg by mouth daily.       Marland Kitchen zolpidem (AMBIEN) 10 MG tablet Take 10 mg by mouth at bedtime.       No current facility-administered medications for this visit.   Facility-Administered Medications Ordered in Other Visits  Medication Dose Route Frequency Provider Last Rate Last Dose  . sodium chloride 0.9 % injection 10 mL  10 mL Intracatheter PRN Victorino December, MD   10 mL at 03/14/12 1628    SURGICAL HISTORY:  Past Surgical History  Procedure Date  . Breast biopsy 1998    left  . Total shoulder  replacement 2011    left  . Eus 06/04/2011    Procedure: UPPER ENDOSCOPIC ULTRASOUND (EUS) LINEAR;  Surgeon: Rob Bunting, MD;  Location: [facility];  Service: Endoscopy;  Laterality: N/A;  . Exploratory laparotomy      biopsy of intra-abdominal mass  . Bladder tack   . Tubal ligation   . Appendectomy   . Dilation and curettage of uterus   . Esophagogastroduodenoscopy   . Mastectomy w/ sentinel node biopsy 12/18/2011    Procedure: MASTECTOMY WITH SENTINEL LYMPH NODE BIOPSY;  Surgeon: Emelia Loron, MD;  Location: [facility];  Service: General;  Laterality: Left;  . Portacath placement 01/27/2012    Procedure: INSERTION PORT-A-CATH;  Surgeon: Emelia Loron, MD;  Location: [facility];  Service: General;  Laterality: Right;  PORT PLACEMENT    REVIEW OF SYSTEMS:  Pertinent items are noted in HPI.   PHYSICAL EXAMINATION:  Patient is awake alert in no acute distress HEENT exam EOMI PERRLA sclerae anicteric no conjunctival pallor oral mucosa is moist no thrush or mucositis neck is supple no palpable cervical supraclavicular or axillary adenopathy lungs are clear but distant breath sounds no wheezes rales or rhonchi cardiovascular is regular rate rhythm no murmurs gallops or rubs abdomen is soft nontender nondistended bowel sounds are present no active splenomegaly extremities trace edema neuro patient's alert oriented otherwise nonfocal Left mastectomy site clear no evidence of infection or recurrence.Right breast no masses or nipple discharge  ECOG PERFORMANCE STATUS: 1 - Symptomatic but completely ambulatory  Blood pressure 136/75, pulse 111, temperature 98.4 F (36.9 C), temperature source Oral, resp. rate 20, height 5\' 1"  (1.549 m), weight 166 lb 8 oz (75.524 kg).  LABORATORY DATA: Lab Results  Component Value Date   WBC 6.3 03/21/2012   HGB 10.5* 03/21/2012   HCT 31.8* 03/21/2012   MCV 83.9 03/21/2012   PLT 272 03/21/2012      Chemistry      Component Value  Date/Time   NA 138 03/14/2012 1221   NA 139 02/22/2012 1148   K 3.4* 03/14/2012 1221   K 4.0 02/22/2012 1148   CL 104 03/14/2012 1221   CL 106 02/22/2012 1148   CO2 23 03/14/2012 1221   CO2 27 02/22/2012 1148   BUN 14.0 03/14/2012 1221   BUN 15 02/22/2012 1148   CREATININE 0.7 03/14/2012 1221   CREATININE 0.52 02/22/2012 1148      Component Value Date/Time   CALCIUM 8.7 03/14/2012 1221   CALCIUM 8.6 02/22/2012 1148   ALKPHOS 78 03/14/2012 1221   ALKPHOS 59 02/22/2012 1148   AST 16 03/14/2012 1221   AST 21 02/22/2012 1148   ALT 30 03/14/2012 1221   ALT 30 02/22/2012 1148   BILITOT 0.40 03/14/2012 1221   BILITOT 0.2* 02/22/2012 1148    ADDITIONAL INFORMATION: 2. PROGNOSTIC INDICATORS - ACIS Results IMMUNOHISTOCHEMICAL AND MORPHOMETRIC ANALYSIS BY THE AUTOMATED CELLULAR IMAGING SYSTEM (ACIS) THE SMALLER TUMOR SHOWS THE FOLLOWING BREAST PROGNOSTIC PROFILE: Estrogen Receptor (Negative, <1%): 100%,POSITIVE, STRONG STAINING INTENSITY Progesterone Receptor (Negative, <1%): 7%,POSITIVE, STRONG STAINING INTENSITY Proliferation Marker Ki67 by M IB-1 (Low<20%): 50% All controls stained appropriately Abigail Miyamoto MD Pathologist, Electronic Signature ( Signed 12/25/2011) 2. CHROMOGENIC IN-SITU HYBRIDIZATION Interpretation: 0.7 CM SMALLER TUMOR: HER2/NEU BY CISH - SHOWS AMPLIFICATION BY CISH ANALYSIS. THE RATIO OF HER2: CEP 17 SIGNALS WAS 4.94. 1.7 CM LEFT SUPERIOR TUMOR: HER-2/NEU BY CISH - NO AMPLIFICATION OF HER-2 DETECTED. THE RATIO OF HER-2: CEP 17 SIGNALS WAS 1.14. Reference range: Ratio: HER2:CEP17 < 1.8 - gene amplification not observed Ratio: HER2:CEP 17 1.8-2.2 - equivocal result Ratio: HER2:CEP17 > 2.2 - gene amplification observed 1 of 4 FINAL for Mccarthy, Kalinda S [phone]) ADDITIONAL INFORMATION:(continued) Comment: The cancer center was notified on 12-24-2011. Abigail Miyamoto MD Pathologist, Electronic Signature ( Signed 12/24/2011) FINAL DIAGNOSIS Diagnosis 1. Lymph node, sentinel,  biopsy, Left - METASTATIC CARCINOMA IN 1 OF 1 LYMPH NODE WITH FOCAL CAPSULAR EXTENSION (1/1). 2. Breast, simple mastectomy, Left - INVASIVE DUCTAL CARCINOMA, TWO FOCI, WELL DIFFERENTIATED, SPANNING 1.7 AND 0.7 CM. - DUCTAL CARCINOMA IN SITU. - LYMPHOVASCULAR INVASION IS IDENTIFIED. - THE SURGICAL RESECTION MARGINS ARE NEGATIVE FOR CARCINOMA. - SEE ONCOLOGY TABLE BELOW. 3. Breast, biopsy, left, additional tissue - BENIGN SKIN  AND SUBCUTANEOUS TISSUE. - THERE IS NO EVIDENCE OF MALIGNANCY. Microscopic Comment 2. BREAST, INVASIVE TUMOR, WITH LYMPH NODE SAMPLING Specimen, including laterality: Left breast. Procedure: Simple mastectomy Grade: Both are I Tubule formation: 1 and 1 Nuclear pleomorphism: 1 and 2 Mitotic:1 and 1 Tumor size (gross measurement): 1.7 and 0.7 cm Margins: Negative for carcinoma Invasive, distance to closest margin: 2.0 cm to the deep margin (gross measurement) In-situ, distance to closest margin: 2.0 cm from the deep margin (gross measurement). Lymphovascular invasion: Present. Ductal carcinoma in situ: Present. Grade: Intermediate grade. Extensive intraductal component: No. Lobular neoplasia: Not identified. Tumor focality: Two foci. Treatment effect: N/A Extent of tumor: Confined to breast parenchyma Lymph nodes: # examined: 1 Lymph nodes with metastasis: 1 Isolated tumor cells (< 0.2 mm): 0 Micrometastasis: (> 0.2 mm and < 2.0 mm): 0 Macrometastasis: (> 2.0 mm): 1 Extracapsular extension: Present, focal. 2 of 4 FINAL for [place]in[place], Taytem S [phone]) Microscopic Comment(continued) Breast prognostic profile: [phone] Estrogen receptor: Positive (100%, strong staining intensity). Progesterone receptor: Positive (100%, strong staining intensity. Her 2 neu: No amplification was detected. The ratio was 1.45. Her 2 neu by CISH will be repeated on both tumors and the results reported separately. Ki-67: 13% Non-neoplastic breast: Healing biopsy  site. TNM: mpT1c, pN1a Comments: Grossly, there are two foci of grade I invasive ductal carcinoma present in the simple mastectomy. The larger nodule spans 1.7 cm and is histologically identical to the previous core biopsy, [phone] ("left superior"). The second nodule, 0.7 cm, is grade I invasive ductal carcinoma with extracellular mucin. A complete profile will be performed on this second, smaller nodule. (JBK:gt, 12/22/11) Pecola Leisure MD Pathologist, Electronic Signature   RADIOGRAPHIC STUDIES:  No results found.  ASSESSMENT: 78 year old female with:  #1 new diagnosis of stage II invasive ductal carcinoma of the left breast she is status post mastectomy with finding of 2 foci of invasive ductal carcinoma both are ER/PR positive but the smaller focus measuring 0.7 cm is HER-2/neu positive with a ratio over 4.  #2 patient was begun on adjuvant chemotherapy and Herceptin. With chemotherapy consisting of Taxol given weekly with weekly Herceptin. She received her first cycle on 02/08/2012.  #3 Anemia due to chemotherapy  PLAN:  #1 patient is here for cycle 2 day 15 of Taxol Herceptin.  #2 Anemia: due to chemotherapy, currently asymptomatic  #3 she will return in one week's time for cycle 2 day 22. She knows to call with any problems  I spent >25 minutes counseling the patient face to face. The total time spent in the appointment was 30 minutes.    Drue Second, MD Medical/Oncology [facility] [phone] (beeper) [phone] (Office)  03/21/2012, 1:02 PM

## 2012-03-21 NOTE — Patient Instructions (Signed)
[facility] Discharge Instructions for Patients Receiving Chemotherapy  Today you received the following chemotherapy agents: taxol, herceptin  To help prevent nausea and vomiting after your treatment, we encourage you to take your nausea medication.  Take it as often as prescribed.     If you develop nausea and vomiting that is not controlled by your nausea medication, call the clinic. If it is after clinic hours your family physician or the after hours number for the clinic or go to the Emergency Department.   BELOW ARE SYMPTOMS THAT SHOULD BE REPORTED IMMEDIATELY:  *FEVER GREATER THAN 100.5 F  *CHILLS WITH OR WITHOUT FEVER  NAUSEA AND VOMITING THAT IS NOT CONTROLLED WITH YOUR NAUSEA MEDICATION  *UNUSUAL SHORTNESS OF BREATH  *UNUSUAL BRUISING OR BLEEDING  TENDERNESS IN MOUTH AND THROAT WITH OR WITHOUT PRESENCE OF ULCERS  *URINARY PROBLEMS  *BOWEL PROBLEMS  UNUSUAL RASH Items with * indicate a potential emergency and should be followed up as soon as possible.  Feel free to call the clinic you have any questions or concerns. The clinic phone number is [phone].   I have been informed and understand all the instructions given to me. I know to contact the clinic, my physician, or go to the Emergency Department if any problems should occur. I do not have any questions at this time, but understand that I may call the clinic during office hours   should I have any questions or need assistance in obtaining follow up care.    __________________________________________  _____________  __________ Signature of Patient or Authorized Representative            Date                   Time    __________________________________________ Nurse's Signature    

## 2012-03-21 NOTE — Patient Instructions (Addendum)
Proceed with chemotherapy today  Return in 1 week for next MD visit and chemotherapy

## 2012-03-25 ENCOUNTER — Other Ambulatory Visit: Payer: Self-pay | Admitting: *Deleted

## 2012-03-25 DIAGNOSIS — C50919 Malignant neoplasm of unspecified site of unspecified female breast: Secondary | ICD-10-CM

## 2012-03-28 ENCOUNTER — Telehealth: Payer: Self-pay | Admitting: *Deleted

## 2012-03-28 ENCOUNTER — Ambulatory Visit (HOSPITAL_BASED_OUTPATIENT_CLINIC_OR_DEPARTMENT_OTHER): Payer: Medicare Other

## 2012-03-28 ENCOUNTER — Ambulatory Visit (HOSPITAL_BASED_OUTPATIENT_CLINIC_OR_DEPARTMENT_OTHER): Payer: Medicare Other | Admitting: Oncology

## 2012-03-28 ENCOUNTER — Encounter: Payer: Self-pay | Admitting: Oncology

## 2012-03-28 ENCOUNTER — Other Ambulatory Visit (HOSPITAL_BASED_OUTPATIENT_CLINIC_OR_DEPARTMENT_OTHER): Payer: Medicare Other | Admitting: Lab

## 2012-03-28 VITALS — BP 135/68 | HR 91 | Temp 98.6°F | Resp 20 | Ht 61.0 in | Wt 170.0 lb

## 2012-03-28 DIAGNOSIS — D6481 Anemia due to antineoplastic chemotherapy: Secondary | ICD-10-CM

## 2012-03-28 DIAGNOSIS — C50919 Malignant neoplasm of unspecified site of unspecified female breast: Secondary | ICD-10-CM

## 2012-03-28 DIAGNOSIS — C50419 Malignant neoplasm of upper-outer quadrant of unspecified female breast: Secondary | ICD-10-CM

## 2012-03-28 DIAGNOSIS — C773 Secondary and unspecified malignant neoplasm of axilla and upper limb lymph nodes: Secondary | ICD-10-CM

## 2012-03-28 DIAGNOSIS — Z5111 Encounter for antineoplastic chemotherapy: Secondary | ICD-10-CM

## 2012-03-28 LAB — COMPREHENSIVE METABOLIC PANEL (CC13)
ALT: 56 U/L — ABNORMAL HIGH (ref 0–55)
Albumin: 3.3 g/dL — ABNORMAL LOW (ref 3.5–5.0)
CO2: 23 mEq/L (ref 22–29)
Calcium: 9.6 mg/dL (ref 8.4–10.4)
Chloride: 102 mEq/L (ref 98–107)
Glucose: 128 mg/dl — ABNORMAL HIGH (ref 70–99)
Potassium: 3.9 mEq/L (ref 3.5–5.1)
Sodium: 136 mEq/L (ref 136–145)
Total Bilirubin: 0.4 mg/dL (ref 0.20–1.20)
Total Protein: 6.1 g/dL — ABNORMAL LOW (ref 6.4–8.3)

## 2012-03-28 LAB — CBC WITH DIFFERENTIAL/PLATELET
Basophils Absolute: 0.1 10*3/uL (ref 0.0–0.1)
EOS%: 2.2 % (ref 0.0–7.0)
Eosinophils Absolute: 0.2 10*3/uL (ref 0.0–0.5)
HGB: 10.1 g/dL — ABNORMAL LOW (ref 11.6–15.9)
LYMPH%: 37.9 % (ref 14.0–49.7)
MCH: 28.3 pg (ref 25.1–34.0)
MCV: 85.4 fL (ref 79.5–101.0)
MONO%: 11.5 % (ref 0.0–14.0)
NEUT#: 3.8 10*3/uL (ref 1.5–6.5)
Platelets: 268 10*3/uL (ref 145–400)
RBC: 3.57 10*6/uL — ABNORMAL LOW (ref 3.70–5.45)

## 2012-03-28 LAB — TECHNOLOGIST REVIEW

## 2012-03-28 MED ORDER — ACETAMINOPHEN 325 MG PO TABS
650.0000 mg | ORAL_TABLET | Freq: Once | ORAL | Status: AC
  Administered 2012-03-28: 650 mg via ORAL

## 2012-03-28 MED ORDER — PACLITAXEL CHEMO INJECTION 300 MG/50ML
80.0000 mg/m2 | Freq: Once | INTRAVENOUS | Status: AC
  Administered 2012-03-28: 144 mg via INTRAVENOUS
  Filled 2012-03-28: qty 24

## 2012-03-28 MED ORDER — DIPHENHYDRAMINE HCL 50 MG/ML IJ SOLN
50.0000 mg | Freq: Once | INTRAMUSCULAR | Status: AC
  Administered 2012-03-28: 50 mg via INTRAVENOUS

## 2012-03-28 MED ORDER — HEPARIN SOD (PORK) LOCK FLUSH 100 UNIT/ML IV SOLN
500.0000 [IU] | Freq: Once | INTRAVENOUS | Status: AC | PRN
  Administered 2012-03-28: 500 [IU]
  Filled 2012-03-28: qty 5

## 2012-03-28 MED ORDER — FAMOTIDINE IN NACL 20-0.9 MG/50ML-% IV SOLN
20.0000 mg | Freq: Once | INTRAVENOUS | Status: AC
  Administered 2012-03-28: 20 mg via INTRAVENOUS

## 2012-03-28 MED ORDER — DEXAMETHASONE SODIUM PHOSPHATE 4 MG/ML IJ SOLN
20.0000 mg | Freq: Once | INTRAMUSCULAR | Status: AC
  Administered 2012-03-28: 20 mg via INTRAVENOUS

## 2012-03-28 MED ORDER — SODIUM CHLORIDE 0.9 % IV SOLN
Freq: Once | INTRAVENOUS | Status: AC
  Administered 2012-03-28: 13:00:00 via INTRAVENOUS

## 2012-03-28 MED ORDER — OXYCODONE HCL 5 MG PO TABS: 5.0000 mg | ORAL_TABLET | Freq: Four times a day (QID) | ORAL | Status: DC | PRN

## 2012-03-28 MED ORDER — SODIUM CHLORIDE 0.9 % IJ SOLN
10.0000 mL | INTRAMUSCULAR | Status: DC | PRN
  Administered 2012-03-28: 10 mL
  Filled 2012-03-28: qty 10

## 2012-03-28 MED ORDER — TRASTUZUMAB CHEMO INJECTION 440 MG
2.0000 mg/kg | Freq: Once | INTRAVENOUS | Status: AC
  Administered 2012-03-28: 147 mg via INTRAVENOUS
  Filled 2012-03-28: qty 7

## 2012-03-28 MED ORDER — ONDANSETRON 8 MG/50ML IVPB (CHCC)
8.0000 mg | Freq: Once | INTRAVENOUS | Status: AC
  Administered 2012-03-28: 8 mg via INTRAVENOUS

## 2012-03-28 NOTE — Patient Instructions (Addendum)
[facility] Discharge Instructions for Patients Receiving Chemotherapy  Today you received the following chemotherapy agents: herceptin, taxol  To help prevent nausea and vomiting after your treatment, we encourage you to take your nausea medication.  Take it as often as prescribed.     If you develop nausea and vomiting that is not controlled by your nausea medication, call the clinic. If it is after clinic hours your family physician or the after hours number for the clinic or go to the Emergency Department.   BELOW ARE SYMPTOMS THAT SHOULD BE REPORTED IMMEDIATELY:  *FEVER GREATER THAN 100.5 F  *CHILLS WITH OR WITHOUT FEVER  NAUSEA AND VOMITING THAT IS NOT CONTROLLED WITH YOUR NAUSEA MEDICATION  *UNUSUAL SHORTNESS OF BREATH  *UNUSUAL BRUISING OR BLEEDING  TENDERNESS IN MOUTH AND THROAT WITH OR WITHOUT PRESENCE OF ULCERS  *URINARY PROBLEMS  *BOWEL PROBLEMS  UNUSUAL RASH Items with * indicate a potential emergency and should be followed up as soon as possible.  Feel free to call the clinic you have any questions or concerns. The clinic phone number is [phone].   I have been informed and understand all the instructions given to me. I know to contact the clinic, my physician, or go to the Emergency Department if any problems should occur. I do not have any questions at this time, but understand that I may call the clinic during office hours   should I have any questions or need assistance in obtaining follow up care.    __________________________________________  _____________  __________ Signature of Patient or Authorized Representative            Date                   Time    __________________________________________ Nurse's Signature    

## 2012-03-28 NOTE — Telephone Encounter (Signed)
As of 03-28-2012 nothing needs to be scheduled for the patient

## 2012-03-28 NOTE — Progress Notes (Signed)
OFFICE PROGRESS NOTE  CC  Brandy Labella, MD [address] [place] [address] Dr. Emelia Loron Dr. Antony Blackbird  DIAGNOSIS:  78 year old female with new diagnosis of multifocal breast cancer of the left breast. Patient is status post mastectomy with sentinel lymph node biopsy performed on 12/18/2011.   PRIOR THERAPY: 1.Patient has had multiple biopsies performed including endoscopic ultrasound guided biopsies as well as an open biopsy without a definitive diagnosis  #2 patient now with new diagnosis of multifocal breast cancer of the left breast. She underwent a mammogram after she felt a mass in the left breast. The mammogram showed an abnormality. As a biopsy was performed and she was found to have an ace invasive ductal carcinoma. She has now had a mastectomy on 12/18/2011. The final pathology revealed 2 foci of well-differentiated invasive ductal carcinoma one measuring 1.7 cm and the second measuring 0.7 cm there was ductal carcinoma in situ lymphovascular invasion was also identified all surgical margins were negative for carcinoma. Patient went on to also have a left sentinel node biopsy one sentinel node was positive for metastatic disease with focal capsular extension. The tumor was estrogen receptor positive progesterone receptor positive. The largest tumor measuring 1.7 cm was HER-2/neu negative with a Ki-67 of 13%. The smaller tumor showed ER +100% PR receptor 7% proliferation marker 50% and it was HER-2/neu positive with a ratio 4.94.  #3 patient has been seen by radiation oncology and post mastectomy radiation therapy is recommended. At this time she will not get a left axillary lymph node dissection.  #4 patient's tumor is HER-2/neu positive and does she will receive adjuvant chemotherapy and Herceptin based therapy.  #5 patient will proceed with combination chemotherapy consisting of Taxol and Herceptin starting on 02/09/2012. A total of 12 weeks of combination  treatment is planned. Once she completes this she will undergo radiation therapy. After which she will receive Herceptin for one year with 5 years of letrozole 2.5 mg daily.  CURRENT THERAPY:Cycle 2 day 22 of Taxol and Herceptin   INTERVAL HISTORY: Brandy [name] 78 y.o. female returns for followup visit today.She continues to do well. She is due for cycle 2 day 22 of Taxol and Herceptin today. She is a little tired. She has a rash since she sat outside in the sun. Rash mainly in the arms and hands. Nonpruritic She has no shortness of breath or chest pains or palpitations. She has no bleeding problems. She has no nausea or vomiting she denies having any peripheral paresthesias. Remainder of the 10 point review of systems is negative.   MEDICAL HISTORY: Past Medical History  Diagnosis Date  . Asthma   . Hyperlipidemia     takes Simvastatin daily  . Hypertension     takes Amlodipine and Diovan daily  . Cancer     left breast  . Bronchitis     hx of;last time >34yr ago  . Arthritis     shoulders  . Dysphagia   . H/O hiatal hernia   . GERD (gastroesophageal reflux disease)     takes Prilosec prn  . Urinary urgency   . History of kidney stones     many yrs ago  . Depression     takes Celexa daily  . Insomnia     takes Ambien nightly  . Breast cancer 12/18/11    left breast masectomy=metastatic ca in (1/1) lymph node ,invasive ductal ca,2 foci,,dcis,lymph ovascular invasion identified,surgical resection margins neg for ca,additional tissue=benign skin and  subcutaneous tissue  . Breast CA 11/23/11    left breast 3 o'clock bx=high grade ductal ca in situ w/comedononecrosis and calcification,dcis,lymphovascular invasion present ,ER?PR+positive  . Full dentures     ALLERGIES:   has no known allergies.  MEDICATIONS:  Current Outpatient Prescriptions  Medication Sig Dispense Refill  . amLODipine (NORVASC) 10 MG tablet Take 10 mg by mouth daily.       . citalopram (CELEXA) 20 MG tablet  Take 20 mg by mouth daily.      Marland Kitchen dexamethasone (DECADRON) 4 MG tablet Take 2 tablets by mouth daily starting the day after chemotherapy for 2 days. Take with food.  30 tablet  1  . DIOVAN HCT 160-12.5 MG per tablet Take 2 tablets by mouth every morning.       Marland Kitchen HYDROcodone-acetaminophen (NORCO) 5-325 MG per tablet       . lidocaine-prilocaine (EMLA) cream Apply topically as needed.  30 g  4  . LORazepam (ATIVAN) 0.5 MG tablet Take 1 tablet (0.5 mg total) by mouth every 6 (six) hours as needed (Nausea or vomiting).  30 tablet  0  . omeprazole (PRILOSEC) 20 MG capsule Take 20 mg by mouth daily.      . ondansetron (ZOFRAN) 8 MG tablet Take 1 tablet two times a day starting the day after chemo for 2 days. Then take 1 tablet two times a day as needed for nausea or vomiting.  30 tablet  1  . oxyCODONE (OXY IR/ROXICODONE) 5 MG immediate release tablet Take 1 tablet (5 mg total) by mouth every 6 (six) hours as needed for pain.  60 tablet  0  . potassium chloride SA (K-DUR,KLOR-CON) 20 MEQ tablet Take 1 tablet (20 mEq total) by mouth 2 (two) times daily.  20 tablet  0  . prochlorperazine (COMPAZINE) 10 MG tablet Take 1 tablet (10 mg total) by mouth every 6 (six) hours as needed (Nausea or vomiting).  30 tablet  1  . prochlorperazine (COMPAZINE) 25 MG suppository Place 1 suppository (25 mg total) rectally every 12 (twelve) hours as needed for nausea.  12 suppository  3  . simvastatin (ZOCOR) 20 MG tablet Take 20 mg by mouth daily.       Marland Kitchen zolpidem (AMBIEN) 10 MG tablet Take 10 mg by mouth at bedtime.       No current facility-administered medications for this visit.   Facility-Administered Medications Ordered in Other Visits  Medication Dose Route Frequency Provider Last Rate Last Dose  . sodium chloride 0.9 % injection 10 mL  10 mL Intracatheter PRN Victorino December, MD   10 mL at 03/14/12 1628    SURGICAL HISTORY:  Past Surgical History  Procedure Date  . Breast biopsy 1998    left  . Total  shoulder replacement 2011    left  . Eus 06/04/2011    Procedure: UPPER ENDOSCOPIC ULTRASOUND (EUS) LINEAR;  Surgeon: Rob Bunting, MD;  Location: [facility];  Service: Endoscopy;  Laterality: N/A;  . Exploratory laparotomy      biopsy of intra-abdominal mass  . Bladder tack   . Tubal ligation   . Appendectomy   . Dilation and curettage of uterus   . Esophagogastroduodenoscopy   . Mastectomy w/ sentinel node biopsy 12/18/2011    Procedure: MASTECTOMY WITH SENTINEL LYMPH NODE BIOPSY;  Surgeon: Emelia Loron, MD;  Location: [facility];  Service: General;  Laterality: Left;  . Portacath placement 01/27/2012    Procedure: INSERTION PORT-A-CATH;  Surgeon: Emelia Loron, MD;  Location: [facility];  Service: General;  Laterality: Right;  PORT PLACEMENT    REVIEW OF SYSTEMS:  Pertinent items are noted in HPI.   PHYSICAL EXAMINATION:  Patient is awake alert in no acute distress HEENT exam EOMI PERRLA sclerae anicteric no conjunctival pallor oral mucosa is moist no thrush or mucositis neck is supple no palpable cervical supraclavicular or axillary adenopathy lungs are clear but distant breath sounds no wheezes rales or rhonchi cardiovascular is regular rate rhythm no murmurs gallops or rubs abdomen is soft nontender nondistended bowel sounds are present no active splenomegaly extremities trace edema neuro patient's alert oriented otherwise nonfocal Left mastectomy site clear no evidence of infection or recurrence.Right breast no masses or nipple discharge  ECOG PERFORMANCE STATUS: 1 - Symptomatic but completely ambulatory  Blood pressure 135/68, pulse 91, temperature 98.6 F (37 C), temperature source Oral, resp. rate 20, height 5\' 1"  (1.549 m), weight 170 lb (77.111 kg).  LABORATORY DATA: Lab Results  Component Value Date   WBC 7.9 03/28/2012   HGB 10.1* 03/28/2012   HCT 30.5* 03/28/2012   MCV 85.4 03/28/2012   PLT 268 03/28/2012      Chemistry      Component Value  Date/Time   NA 137 03/21/2012 1152   NA 139 02/22/2012 1148   K 4.0 03/21/2012 1152   K 4.0 02/22/2012 1148   CL 103 03/21/2012 1152   CL 106 02/22/2012 1148   CO2 23 03/21/2012 1152   CO2 27 02/22/2012 1148   BUN 13.0 03/21/2012 1152   BUN 15 02/22/2012 1148   CREATININE 0.7 03/21/2012 1152   CREATININE 0.52 02/22/2012 1148      Component Value Date/Time   CALCIUM 9.2 03/21/2012 1152   CALCIUM 8.6 02/22/2012 1148   ALKPHOS 82 03/21/2012 1152   ALKPHOS 59 02/22/2012 1148   AST 35* 03/21/2012 1152   AST 21 02/22/2012 1148   ALT 46 03/21/2012 1152   ALT 30 02/22/2012 1148   BILITOT 0.50 03/21/2012 1152   BILITOT 0.2* 02/22/2012 1148    ADDITIONAL INFORMATION: 2. PROGNOSTIC INDICATORS - ACIS Results IMMUNOHISTOCHEMICAL AND MORPHOMETRIC ANALYSIS BY THE AUTOMATED CELLULAR IMAGING SYSTEM (ACIS) THE SMALLER TUMOR SHOWS THE FOLLOWING BREAST PROGNOSTIC PROFILE: Estrogen Receptor (Negative, <1%): 100%,POSITIVE, STRONG STAINING INTENSITY Progesterone Receptor (Negative, <1%): 7%,POSITIVE, STRONG STAINING INTENSITY Proliferation Marker Ki67 by M IB-1 (Low<20%): 50% All controls stained appropriately Abigail Miyamoto MD Pathologist, Electronic Signature ( Signed 12/25/2011) 2. CHROMOGENIC IN-SITU HYBRIDIZATION Interpretation: 0.7 CM SMALLER TUMOR: HER2/NEU BY CISH - SHOWS AMPLIFICATION BY CISH ANALYSIS. THE RATIO OF HER2: CEP 17 SIGNALS WAS 4.94. 1.7 CM LEFT SUPERIOR TUMOR: HER-2/NEU BY CISH - NO AMPLIFICATION OF HER-2 DETECTED. THE RATIO OF HER-2: CEP 17 SIGNALS WAS 1.14. Reference range: Ratio: HER2:CEP17 < 1.8 - gene amplification not observed Ratio: HER2:CEP 17 1.8-2.2 - equivocal result Ratio: HER2:CEP17 > 2.2 - gene amplification observed 1 of 4 FINAL for Nedd, Ellora S [phone]) ADDITIONAL INFORMATION:(continued) Comment: The cancer center was notified on 12-24-2011. Abigail Miyamoto MD Pathologist, Electronic Signature ( Signed 12/24/2011) FINAL DIAGNOSIS Diagnosis 1. Lymph node,  sentinel, biopsy, Left - METASTATIC CARCINOMA IN 1 OF 1 LYMPH NODE WITH FOCAL CAPSULAR EXTENSION (1/1). 2. Breast, simple mastectomy, Left - INVASIVE DUCTAL CARCINOMA, TWO FOCI, WELL DIFFERENTIATED, SPANNING 1.7 AND 0.7 CM. - DUCTAL CARCINOMA IN SITU. - LYMPHOVASCULAR INVASION IS IDENTIFIED. - THE SURGICAL RESECTION MARGINS ARE NEGATIVE FOR CARCINOMA. - SEE ONCOLOGY TABLE BELOW. 3. Breast, biopsy, left, additional tissue - BENIGN  SKIN AND SUBCUTANEOUS TISSUE. - THERE IS NO EVIDENCE OF MALIGNANCY. Microscopic Comment 2. BREAST, INVASIVE TUMOR, WITH LYMPH NODE SAMPLING Specimen, including laterality: Left breast. Procedure: Simple mastectomy Grade: Both are I Tubule formation: 1 and 1 Nuclear pleomorphism: 1 and 2 Mitotic:1 and 1 Tumor size (gross measurement): 1.7 and 0.7 cm Margins: Negative for carcinoma Invasive, distance to closest margin: 2.0 cm to the deep margin (gross measurement) In-situ, distance to closest margin: 2.0 cm from the deep margin (gross measurement). Lymphovascular invasion: Present. Ductal carcinoma in situ: Present. Grade: Intermediate grade. Extensive intraductal component: No. Lobular neoplasia: Not identified. Tumor focality: Two foci. Treatment effect: N/A Extent of tumor: Confined to breast parenchyma Lymph nodes: # examined: 1 Lymph nodes with metastasis: 1 Isolated tumor cells (< 0.2 mm): 0 Micrometastasis: (> 0.2 mm and < 2.0 mm): 0 Macrometastasis: (> 2.0 mm): 1 Extracapsular extension: Present, focal. 2 of 4 FINAL for Axtman, Tanae S [phone]) Microscopic Comment(continued) Breast prognostic profile: [phone] Estrogen receptor: Positive (100%, strong staining intensity). Progesterone receptor: Positive (100%, strong staining intensity. Her 2 neu: No amplification was detected. The ratio was 1.45. Her 2 neu by CISH will be repeated on both tumors and the results reported separately. Ki-67: 13% Non-neoplastic breast: Healing  biopsy site. TNM: mpT1c, pN1a Comments: Grossly, there are two foci of grade I invasive ductal carcinoma present in the simple mastectomy. The larger nodule spans 1.7 cm and is histologically identical to the previous core biopsy, [phone] ("left superior"). The second nodule, 0.7 cm, is grade I invasive ductal carcinoma with extracellular mucin. A complete profile will be performed on this second, smaller nodule. (JBK:gt, 12/22/11) Pecola Leisure MD Pathologist, Electronic Signature   RADIOGRAPHIC STUDIES:  No results found.  ASSESSMENT: 78 year old female with:  #1 new diagnosis of stage II invasive ductal carcinoma of the left breast she is status post mastectomy with finding of 2 foci of invasive ductal carcinoma both are ER/PR positive but the smaller focus measuring 0.7 cm is HER-2/neu positive with a ratio over 4.  #2 patient was begun on adjuvant chemotherapy and Herceptin. With chemotherapy consisting of Taxol given weekly with weekly Herceptin. She received her first cycle on 02/08/2012.  #3 Anemia due to chemotherapy  PLAN:  #1 patient is here for cycle 2 day 22 of Taxol Herceptin.  #2 Anemia: due to chemotherapy, currently asymptomatic  #3 she will return on 9/30 for cycle 3 day 1. She knows to call with any problems  I spent >25 minutes counseling the patient face to face. The total time spent in the appointment was 30 minutes.    Drue Second, MD Medical/Oncology [facility] [phone] (beeper) [phone] (Office)  03/28/2012, 11:35 AM

## 2012-03-28 NOTE — Patient Instructions (Addendum)
Proceed with chemotherapy today  I will see you back in 1 week 

## 2012-04-04 ENCOUNTER — Encounter: Payer: Self-pay | Admitting: Oncology

## 2012-04-04 ENCOUNTER — Ambulatory Visit (HOSPITAL_BASED_OUTPATIENT_CLINIC_OR_DEPARTMENT_OTHER): Payer: Medicare Other | Admitting: Oncology

## 2012-04-04 ENCOUNTER — Other Ambulatory Visit (HOSPITAL_BASED_OUTPATIENT_CLINIC_OR_DEPARTMENT_OTHER): Payer: Medicare Other | Admitting: Lab

## 2012-04-04 ENCOUNTER — Ambulatory Visit (HOSPITAL_BASED_OUTPATIENT_CLINIC_OR_DEPARTMENT_OTHER): Payer: Medicare Other

## 2012-04-04 ENCOUNTER — Other Ambulatory Visit: Payer: Self-pay | Admitting: *Deleted

## 2012-04-04 VITALS — BP 146/72 | HR 112 | Temp 98.6°F | Resp 20 | Ht 61.0 in | Wt 169.4 lb

## 2012-04-04 DIAGNOSIS — Z5112 Encounter for antineoplastic immunotherapy: Secondary | ICD-10-CM

## 2012-04-04 DIAGNOSIS — C50919 Malignant neoplasm of unspecified site of unspecified female breast: Secondary | ICD-10-CM

## 2012-04-04 DIAGNOSIS — C50419 Malignant neoplasm of upper-outer quadrant of unspecified female breast: Secondary | ICD-10-CM

## 2012-04-04 DIAGNOSIS — C773 Secondary and unspecified malignant neoplasm of axilla and upper limb lymph nodes: Secondary | ICD-10-CM

## 2012-04-04 DIAGNOSIS — T451X5A Adverse effect of antineoplastic and immunosuppressive drugs, initial encounter: Secondary | ICD-10-CM

## 2012-04-04 DIAGNOSIS — D6481 Anemia due to antineoplastic chemotherapy: Secondary | ICD-10-CM

## 2012-04-04 DIAGNOSIS — Z170 Estrogen receptor positive status [ER+]: Secondary | ICD-10-CM

## 2012-04-04 LAB — CBC WITH DIFFERENTIAL/PLATELET
BASO%: 0.4 % (ref 0.0–2.0)
Eosinophils Absolute: 0.1 10*3/uL (ref 0.0–0.5)
LYMPH%: 34.2 % (ref 14.0–49.7)
MCHC: 32.8 g/dL (ref 31.5–36.0)
MONO#: 0.7 10*3/uL (ref 0.1–0.9)
MONO%: 8.8 % (ref 0.0–14.0)
NEUT#: 4.2 10*3/uL (ref 1.5–6.5)
Platelets: 254 10*3/uL (ref 145–400)
RBC: 3.67 10*6/uL — ABNORMAL LOW (ref 3.70–5.45)
RDW: 17.2 % — ABNORMAL HIGH (ref 11.2–14.5)
WBC: 7.7 10*3/uL (ref 3.9–10.3)
nRBC: 0 % (ref 0–0)

## 2012-04-04 LAB — IRON AND TIBC
%SAT: 9 % — ABNORMAL LOW (ref 20–55)
TIBC: 282 ug/dL (ref 250–470)
UIBC: 258 ug/dL (ref 125–400)

## 2012-04-04 LAB — COMPREHENSIVE METABOLIC PANEL (CC13)
ALT: 40 U/L (ref 0–55)
AST: 21 U/L (ref 5–34)
Alkaline Phosphatase: 77 U/L (ref 40–150)
BUN: 12 mg/dL (ref 7.0–26.0)
Calcium: 9.2 mg/dL (ref 8.4–10.4)
Chloride: 104 mEq/L (ref 98–107)
Creatinine: 0.6 mg/dL (ref 0.6–1.1)
Total Bilirubin: 0.4 mg/dL (ref 0.20–1.20)

## 2012-04-04 LAB — FERRITIN: Ferritin: 121 ng/mL (ref 10–291)

## 2012-04-04 MED ORDER — HEPARIN SOD (PORK) LOCK FLUSH 100 UNIT/ML IV SOLN
500.0000 [IU] | Freq: Once | INTRAVENOUS | Status: AC | PRN
  Administered 2012-04-04: 500 [IU]
  Filled 2012-04-04: qty 5

## 2012-04-04 MED ORDER — SODIUM CHLORIDE 0.9 % IV SOLN
Freq: Once | INTRAVENOUS | Status: AC
  Administered 2012-04-04: 14:00:00 via INTRAVENOUS

## 2012-04-04 MED ORDER — ACETAMINOPHEN 325 MG PO TABS
650.0000 mg | ORAL_TABLET | Freq: Once | ORAL | Status: AC
  Administered 2012-04-04: 650 mg via ORAL

## 2012-04-04 MED ORDER — SODIUM CHLORIDE 0.9 % IJ SOLN
10.0000 mL | INTRAMUSCULAR | Status: DC | PRN
  Administered 2012-04-04: 10 mL
  Filled 2012-04-04: qty 10

## 2012-04-04 MED ORDER — DIPHENHYDRAMINE HCL 25 MG PO CAPS
50.0000 mg | ORAL_CAPSULE | Freq: Once | ORAL | Status: AC
  Administered 2012-04-04: 50 mg via ORAL

## 2012-04-04 MED ORDER — TRASTUZUMAB CHEMO INJECTION 440 MG
2.0000 mg/kg | Freq: Once | INTRAVENOUS | Status: AC
  Administered 2012-04-04: 147 mg via INTRAVENOUS
  Filled 2012-04-04: qty 7

## 2012-04-04 NOTE — Patient Instructions (Addendum)
Proceed with herceptin only today, hold taxol  I will see you back in 1 week for follow up

## 2012-04-04 NOTE — Progress Notes (Signed)
OFFICE PROGRESS NOTE  CC  Brandy Labella, MD [address] [place] [address] Dr. Emelia Loron Dr. Antony Blackbird  DIAGNOSIS:  78 year old female with new diagnosis of multifocal breast cancer of the left breast. Patient is status post mastectomy with sentinel lymph node biopsy performed on 12/18/2011.   PRIOR THERAPY: 1.Patient has had multiple biopsies performed including endoscopic ultrasound guided biopsies as well as an open biopsy without a definitive diagnosis  #2 patient now with new diagnosis of multifocal breast cancer of the left breast. She underwent a mammogram after she felt a mass in the left breast. The mammogram showed an abnormality. As a biopsy was performed and she was found to have an ace invasive ductal carcinoma. She has now had a mastectomy on 12/18/2011. The final pathology revealed 2 foci of well-differentiated invasive ductal carcinoma one measuring 1.7 cm and the second measuring 0.7 cm there was ductal carcinoma in situ lymphovascular invasion was also identified all surgical margins were negative for carcinoma. Patient went on to also have a left sentinel node biopsy one sentinel node was positive for metastatic disease with focal capsular extension. The tumor was estrogen receptor positive progesterone receptor positive. The largest tumor measuring 1.7 cm was HER-2/neu negative with a Ki-67 of 13%. The smaller tumor showed ER +100% PR receptor 7% proliferation marker 50% and it was HER-2/neu positive with a ratio 4.94.  #3 patient has been seen by radiation oncology and post mastectomy radiation therapy is recommended. At this time she will not get a left axillary lymph node dissection.  #4 patient's tumor is HER-2/neu positive and does she will receive adjuvant chemotherapy and Herceptin based therapy.  #5 patient will proceed with combination chemotherapy consisting of Taxol and Herceptin starting on 02/09/2012. A total of 12 weeks of combination  treatment is planned. Once she completes this she will undergo radiation therapy. After which she will receive Herceptin for one year with 5 years of letrozole 2.5 mg daily.  CURRENT THERAPY:Cycle 3 day 1 of  Herceptin we will hold taxol today  INTERVAL HISTORY: Brandy [name] 78 y.o. female returns for followup visit today. Overall patient seems to be very fatigued. She is also having some difficulty with balance. I do think that this is due to the Taxol. She denies any nausea or vomiting no fevers chills. She just weak tired fatigued and she is not eating as well. Her daughter is also concerned regarding this. I recommended that we hold the Taxol for now. At least for this day 1 of cycle 4. We will reevaluate next week  MEDICAL HISTORY: Past Medical History  Diagnosis Date  . Asthma   . Hyperlipidemia     takes Simvastatin daily  . Hypertension     takes Amlodipine and Diovan daily  . Cancer     left breast  . Bronchitis     hx of;last time >57yr ago  . Arthritis     shoulders  . Dysphagia   . H/O hiatal hernia   . GERD (gastroesophageal reflux disease)     takes Prilosec prn  . Urinary urgency   . History of kidney stones     many yrs ago  . Depression     takes Celexa daily  . Insomnia     takes Ambien nightly  . Breast cancer 12/18/11    left breast masectomy=metastatic ca in (1/1) lymph node ,invasive ductal ca,2 foci,,dcis,lymph ovascular invasion identified,surgical resection margins neg for ca,additional tissue=benign skin and subcutaneous  tissue  . Breast CA 11/23/11    left breast 3 o'clock bx=high grade ductal ca in situ w/comedononecrosis and calcification,dcis,lymphovascular invasion present ,ER?PR+positive  . Full dentures     ALLERGIES:   has no known allergies.  MEDICATIONS:  Current Outpatient Prescriptions  Medication Sig Dispense Refill  . amLODipine (NORVASC) 10 MG tablet Take 10 mg by mouth daily.       . citalopram (CELEXA) 20 MG tablet Take 20 mg by  mouth daily.      Marland Kitchen dexamethasone (DECADRON) 4 MG tablet Take 2 tablets by mouth daily starting the day after chemotherapy for 2 days. Take with food.  30 tablet  1  . DIOVAN HCT 160-12.5 MG per tablet Take 2 tablets by mouth every morning.       Marland Kitchen HYDROcodone-acetaminophen (NORCO) 5-325 MG per tablet       . lidocaine-prilocaine (EMLA) cream Apply topically as needed.  30 g  4  . LORazepam (ATIVAN) 0.5 MG tablet Take 1 tablet (0.5 mg total) by mouth every 6 (six) hours as needed (Nausea or vomiting).  30 tablet  0  . omeprazole (PRILOSEC) 20 MG capsule Take 20 mg by mouth daily.      . ondansetron (ZOFRAN) 8 MG tablet Take 1 tablet two times a day starting the day after chemo for 2 days. Then take 1 tablet two times a day as needed for nausea or vomiting.  30 tablet  1  . oxyCODONE (OXY IR/ROXICODONE) 5 MG immediate release tablet Take 1 tablet (5 mg total) by mouth every 6 (six) hours as needed for pain.  60 tablet  0  . potassium chloride SA (K-DUR,KLOR-CON) 20 MEQ tablet Take 1 tablet (20 mEq total) by mouth 2 (two) times daily.  20 tablet  0  . prochlorperazine (COMPAZINE) 10 MG tablet Take 1 tablet (10 mg total) by mouth every 6 (six) hours as needed (Nausea or vomiting).  30 tablet  1  . prochlorperazine (COMPAZINE) 25 MG suppository Place 1 suppository (25 mg total) rectally every 12 (twelve) hours as needed for nausea.  12 suppository  3  . simvastatin (ZOCOR) 20 MG tablet Take 20 mg by mouth daily.       Marland Kitchen zolpidem (AMBIEN) 10 MG tablet Take 10 mg by mouth at bedtime.       No current facility-administered medications for this visit.   Facility-Administered Medications Ordered in Other Visits  Medication Dose Route Frequency Provider Last Rate Last Dose  . sodium chloride 0.9 % injection 10 mL  10 mL Intracatheter PRN Victorino December, MD   10 mL at 03/14/12 1628    SURGICAL HISTORY:  Past Surgical History  Procedure Date  . Breast biopsy 1998    left  . Total shoulder replacement  2011    left  . Eus 06/04/2011    Procedure: UPPER ENDOSCOPIC ULTRASOUND (EUS) LINEAR;  Surgeon: Rob Bunting, MD;  Location: [facility];  Service: Endoscopy;  Laterality: N/A;  . Exploratory laparotomy      biopsy of intra-abdominal mass  . Bladder tack   . Tubal ligation   . Appendectomy   . Dilation and curettage of uterus   . Esophagogastroduodenoscopy   . Mastectomy w/ sentinel node biopsy 12/18/2011    Procedure: MASTECTOMY WITH SENTINEL LYMPH NODE BIOPSY;  Surgeon: Emelia Loron, MD;  Location: [facility] OR;  Service: General;  Laterality: Left;  . Portacath placement 01/27/2012    Procedure: INSERTION PORT-A-CATH;  Surgeon: Emelia Loron, MD;  Location: [facility];  Service: General;  Laterality: Right;  PORT PLACEMENT    REVIEW OF SYSTEMS:  Pertinent items are noted in HPI.   PHYSICAL EXAMINATION:  Patient is awake alert in no acute distress HEENT exam EOMI PERRLA sclerae anicteric no conjunctival pallor oral mucosa is moist no thrush or mucositis neck is supple no palpable cervical supraclavicular or axillary adenopathy lungs are clear but distant breath sounds no wheezes rales or rhonchi cardiovascular is regular rate rhythm no murmurs gallops or rubs abdomen is soft nontender nondistended bowel sounds are present no active splenomegaly extremities trace edema neuro patient's alert oriented otherwise nonfocal Left mastectomy site clear no evidence of infection or recurrence.Right breast no masses or nipple discharge  ECOG PERFORMANCE STATUS: 1 - Symptomatic but completely ambulatory  Blood pressure 146/72, pulse 112, temperature 98.6 F (37 C), temperature source Oral, resp. rate 20, height 5\' 1"  (1.549 m), weight 169 lb 6.4 oz (76.839 kg).  LABORATORY DATA: Lab Results  Component Value Date   WBC 7.7 04/04/2012   HGB 10.2* 04/04/2012   HCT 31.1* 04/04/2012   MCV 84.7 04/04/2012   PLT 254 04/04/2012      Chemistry      Component Value Date/Time   NA 136  03/28/2012 1105   NA 139 02/22/2012 1148   K 3.9 03/28/2012 1105   K 4.0 02/22/2012 1148   CL 102 03/28/2012 1105   CL 106 02/22/2012 1148   CO2 23 03/28/2012 1105   CO2 27 02/22/2012 1148   BUN 11.0 03/28/2012 1105   BUN 15 02/22/2012 1148   CREATININE 0.7 03/28/2012 1105   CREATININE 0.52 02/22/2012 1148      Component Value Date/Time   CALCIUM 9.6 03/28/2012 1105   CALCIUM 8.6 02/22/2012 1148   ALKPHOS 81 03/28/2012 1105   ALKPHOS 59 02/22/2012 1148   AST 29 03/28/2012 1105   AST 21 02/22/2012 1148   ALT 56* 03/28/2012 1105   ALT 30 02/22/2012 1148   BILITOT 0.40 03/28/2012 1105   BILITOT 0.2* 02/22/2012 1148    ADDITIONAL INFORMATION: 2. PROGNOSTIC INDICATORS - ACIS Results IMMUNOHISTOCHEMICAL AND MORPHOMETRIC ANALYSIS BY THE AUTOMATED CELLULAR IMAGING SYSTEM (ACIS) THE SMALLER TUMOR SHOWS THE FOLLOWING BREAST PROGNOSTIC PROFILE: Estrogen Receptor (Negative, <1%): 100%,POSITIVE, STRONG STAINING INTENSITY Progesterone Receptor (Negative, <1%): 7%,POSITIVE, STRONG STAINING INTENSITY Proliferation Marker Ki67 by M IB-1 (Low<20%): 50% All controls stained appropriately Abigail Miyamoto MD Pathologist, Electronic Signature ( Signed 12/25/2011) 2. CHROMOGENIC IN-SITU HYBRIDIZATION Interpretation: 0.7 CM SMALLER TUMOR: HER2/NEU BY CISH - SHOWS AMPLIFICATION BY CISH ANALYSIS. THE RATIO OF HER2: CEP 17 SIGNALS WAS 4.94. 1.7 CM LEFT SUPERIOR TUMOR: HER-2/NEU BY CISH - NO AMPLIFICATION OF HER-2 DETECTED. THE RATIO OF HER-2: CEP 17 SIGNALS WAS 1.14. Reference range: Ratio: HER2:CEP17 < 1.8 - gene amplification not observed Ratio: HER2:CEP 17 1.8-2.2 - equivocal result Ratio: HER2:CEP17 > 2.2 - gene amplification observed 1 of 4 FINAL for Mchugh, Larysa S [phone]) ADDITIONAL INFORMATION:(continued) Comment: The cancer center was notified on 12-24-2011. Abigail Miyamoto MD Pathologist, Electronic Signature ( Signed 12/24/2011) FINAL DIAGNOSIS Diagnosis 1. Lymph node, sentinel, biopsy,  Left - METASTATIC CARCINOMA IN 1 OF 1 LYMPH NODE WITH FOCAL CAPSULAR EXTENSION (1/1). 2. Breast, simple mastectomy, Left - INVASIVE DUCTAL CARCINOMA, TWO FOCI, WELL DIFFERENTIATED, SPANNING 1.7 AND 0.7 CM. - DUCTAL CARCINOMA IN SITU. - LYMPHOVASCULAR INVASION IS IDENTIFIED. - THE SURGICAL RESECTION MARGINS ARE NEGATIVE FOR CARCINOMA. - SEE ONCOLOGY TABLE BELOW. 3. Breast, biopsy, left, additional tissue -  BENIGN SKIN AND SUBCUTANEOUS TISSUE. - THERE IS NO EVIDENCE OF MALIGNANCY. Microscopic Comment 2. BREAST, INVASIVE TUMOR, WITH LYMPH NODE SAMPLING Specimen, including laterality: Left breast. Procedure: Simple mastectomy Grade: Both are I Tubule formation: 1 and 1 Nuclear pleomorphism: 1 and 2 Mitotic:1 and 1 Tumor size (gross measurement): 1.7 and 0.7 cm Margins: Negative for carcinoma Invasive, distance to closest margin: 2.0 cm to the deep margin (gross measurement) In-situ, distance to closest margin: 2.0 cm from the deep margin (gross measurement). Lymphovascular invasion: Present. Ductal carcinoma in situ: Present. Grade: Intermediate grade. Extensive intraductal component: No. Lobular neoplasia: Not identified. Tumor focality: Two foci. Treatment effect: N/A Extent of tumor: Confined to breast parenchyma Lymph nodes: # examined: 1 Lymph nodes with metastasis: 1 Isolated tumor cells (< 0.2 mm): 0 Micrometastasis: (> 0.2 mm and < 2.0 mm): 0 Macrometastasis: (> 2.0 mm): 1 Extracapsular extension: Present, focal. 2 of 4 FINAL for Nau, Syann S [phone]) Microscopic Comment(continued) Breast prognostic profile: [phone] Estrogen receptor: Positive (100%, strong staining intensity). Progesterone receptor: Positive (100%, strong staining intensity. Her 2 neu: No amplification was detected. The ratio was 1.45. Her 2 neu by CISH will be repeated on both tumors and the results reported separately. Ki-67: 13% Non-neoplastic breast: Healing biopsy site. TNM:  mpT1c, pN1a Comments: Grossly, there are two foci of grade I invasive ductal carcinoma present in the simple mastectomy. The larger nodule spans 1.7 cm and is histologically identical to the previous core biopsy, [phone] ("left superior"). The second nodule, 0.7 cm, is grade I invasive ductal carcinoma with extracellular mucin. A complete profile will be performed on this second, smaller nodule. (JBK:gt, 12/22/11) Pecola Leisure MD Pathologist, Electronic Signature   RADIOGRAPHIC STUDIES:  No results found.  ASSESSMENT: 78 year old female with:  #1 new diagnosis of stage II invasive ductal carcinoma of the left breast she is status post mastectomy with finding of 2 foci of invasive ductal carcinoma both are ER/PR positive but the smaller focus measuring 0.7 cm is HER-2/neu positive with a ratio over 4.  #2 patient was begun on adjuvant chemotherapy and Herceptin. With chemotherapy consisting of Taxol given weekly with weekly Herceptin. She received her first cycle on 02/08/2012.  #3 Anemia due to chemotherapy  PLAN: 1. Proceed with herceptin only today. Hold taxol due to significant side effects  2. Return in 1 week for follow up  I spent >25 minutes counseling the patient face to face. The total time spent in the appointment was 30 minutes.    Drue Second, MD Medical/Oncology [facility] [phone] (beeper) [phone] (Office)  04/04/2012, 12:45 PM

## 2012-04-04 NOTE — Patient Instructions (Signed)
TRASTUZUMAB (tras TOO zoo mab) is a monoclonal antibody. It targets a protein called HER2. This protein is found in some stomach and breast cancers. This medicine can stop cancer cell growth. This medicine may be used with other cancer treatments. This medicine may be used for other purposes; ask your health care provider or pharmacist if you have questions. What should I tell my health care provider before I take this medicine? They need to know if you have any of these conditions: -heart disease -heart failure -infection (especially a virus infection such as chickenpox, cold sores, or herpes) -lung or breathing disease, like asthma -recent or ongoing radiation therapy -an unusual or allergic reaction to trastuzumab, benzyl alcohol, or other medications, foods, dyes, or preservatives -pregnant or trying to get pregnant -breast-feeding How should I use this medicine? This drug is given as an infusion into a vein. It is administered in a hospital or clinic by a specially trained health care professional. Talk to your pediatrician regarding the use of this medicine in children. This medicine is not approved for use in children. Overdosage: If you think you have taken too much of this medicine contact a poison control center or emergency room at once. NOTE: This medicine is only for you. Do not share this medicine with others. What if I miss a dose? It is important not to miss a dose. Call your doctor or health care professional if you are unable to keep an appointment. What may interact with this medicine? -cyclophosphamide -doxorubicin -warfarin This list may not describe all possible interactions. Give your health care provider a list of all the medicines, herbs, non-prescription drugs, or dietary supplements you use. Also tell them if you smoke, drink alcohol, or use illegal drugs. Some items may interact with your medicine. What should I watch for while using this medicine? Visit your  doctor for checks on your progress. Report any side effects. Continue your course of treatment even though you feel ill unless your doctor tells you to stop. Call your doctor or health care professional for advice if you get a fever, chills or sore throat, or other symptoms of a cold or flu. Do not treat yourself. Try to avoid being around people who are sick. You may experience fever, chills and shaking during your first infusion. These effects are usually mild and can be treated with other medicines. Report any side effects during the infusion to your health care professional. Fever and chills usually do not happen with later infusions. What side effects may I notice from receiving this medicine? Side effects that you should report to your doctor or other health care professional as soon as possible: -breathing difficulties -chest pain or palpitations -cough -dizziness or fainting -fever or chills, sore throat -skin rash, itching or hives -swelling of the legs or ankles -unusually weak or tired Side effects that usually do not require medical attention (report to your doctor or other health care professional if they continue or are bothersome): -loss of appetite -headache -muscle aches -nausea This list may not describe all possible side effects. Call your doctor for medical advice about side effects. You may report side effects to FDA at 1-800-FDA-1088. Where should I keep my medicine? This drug is given in a hospital or clinic and will not be stored at home. NOTE: This sheet is a summary. It may not cover all possible information. If you have questions about this medicine, talk to your doctor, pharmacist, or health care provider.  2012, [employer] Standard. (  04/26/2009 1:43:15 PM) 

## 2012-04-11 ENCOUNTER — Ambulatory Visit (HOSPITAL_BASED_OUTPATIENT_CLINIC_OR_DEPARTMENT_OTHER): Payer: Medicare Other | Admitting: Adult Health

## 2012-04-11 ENCOUNTER — Other Ambulatory Visit (HOSPITAL_BASED_OUTPATIENT_CLINIC_OR_DEPARTMENT_OTHER): Payer: Medicare Other | Admitting: Lab

## 2012-04-11 ENCOUNTER — Telehealth: Payer: Self-pay | Admitting: Oncology

## 2012-04-11 ENCOUNTER — Encounter: Payer: Self-pay | Admitting: Adult Health

## 2012-04-11 ENCOUNTER — Telehealth: Payer: Self-pay | Admitting: *Deleted

## 2012-04-11 ENCOUNTER — Other Ambulatory Visit: Payer: Self-pay | Admitting: Emergency Medicine

## 2012-04-11 ENCOUNTER — Ambulatory Visit (HOSPITAL_BASED_OUTPATIENT_CLINIC_OR_DEPARTMENT_OTHER): Payer: Medicare Other

## 2012-04-11 ENCOUNTER — Other Ambulatory Visit: Payer: Self-pay | Admitting: Adult Health

## 2012-04-11 ENCOUNTER — Other Ambulatory Visit: Payer: Self-pay | Admitting: Oncology

## 2012-04-11 VITALS — BP 148/71 | HR 103 | Temp 97.7°F | Resp 20 | Ht 61.0 in | Wt 171.0 lb

## 2012-04-11 DIAGNOSIS — Z5111 Encounter for antineoplastic chemotherapy: Secondary | ICD-10-CM

## 2012-04-11 DIAGNOSIS — D6481 Anemia due to antineoplastic chemotherapy: Secondary | ICD-10-CM

## 2012-04-11 DIAGNOSIS — Z170 Estrogen receptor positive status [ER+]: Secondary | ICD-10-CM

## 2012-04-11 DIAGNOSIS — Z9010 Acquired absence of unspecified breast and nipple: Secondary | ICD-10-CM

## 2012-04-11 DIAGNOSIS — C50419 Malignant neoplasm of upper-outer quadrant of unspecified female breast: Secondary | ICD-10-CM

## 2012-04-11 LAB — COMPREHENSIVE METABOLIC PANEL (CC13)
ALT: 38 U/L (ref 0–55)
AST: 23 U/L (ref 5–34)
Alkaline Phosphatase: 80 U/L (ref 40–150)
Creatinine: 0.7 mg/dL (ref 0.6–1.1)
Sodium: 136 mEq/L (ref 136–145)
Total Bilirubin: 0.5 mg/dL (ref 0.20–1.20)
Total Protein: 6.2 g/dL — ABNORMAL LOW (ref 6.4–8.3)

## 2012-04-11 LAB — CBC WITH DIFFERENTIAL/PLATELET
BASO%: 0.5 % (ref 0.0–2.0)
EOS%: 1.1 % (ref 0.0–7.0)
Eosinophils Absolute: 0.1 10*3/uL (ref 0.0–0.5)
MCH: 27.8 pg (ref 25.1–34.0)
MCHC: 33.1 g/dL (ref 31.5–36.0)
MCV: 84 fL (ref 79.5–101.0)
MONO%: 8 % (ref 0.0–14.0)
NEUT#: 5.6 10*3/uL (ref 1.5–6.5)
RBC: 3.88 10*6/uL (ref 3.70–5.45)
RDW: 17.2 % — ABNORMAL HIGH (ref 11.2–14.5)
nRBC: 0 % (ref 0–0)

## 2012-04-11 MED ORDER — SODIUM CHLORIDE 0.9 % IV SOLN: Freq: Once | INTRAVENOUS | Status: DC

## 2012-04-11 MED ORDER — SODIUM CHLORIDE 0.9 % IJ SOLN
10.0000 mL | INTRAMUSCULAR | Status: DC | PRN
  Administered 2012-04-11: 10 mL
  Filled 2012-04-11: qty 10

## 2012-04-11 MED ORDER — ACETAMINOPHEN 325 MG PO TABS
650.0000 mg | ORAL_TABLET | Freq: Once | ORAL | Status: AC
  Administered 2012-04-11: 650 mg via ORAL

## 2012-04-11 MED ORDER — DIPHENHYDRAMINE HCL 50 MG/ML IJ SOLN
50.0000 mg | Freq: Once | INTRAMUSCULAR | Status: AC
  Administered 2012-04-11: 50 mg via INTRAVENOUS

## 2012-04-11 MED ORDER — HEPARIN SOD (PORK) LOCK FLUSH 100 UNIT/ML IV SOLN
500.0000 [IU] | Freq: Once | INTRAVENOUS | Status: AC | PRN
  Administered 2012-04-11: 500 [IU]
  Filled 2012-04-11: qty 5

## 2012-04-11 MED ORDER — TRASTUZUMAB CHEMO INJECTION 440 MG
2.0000 mg/kg | Freq: Once | INTRAVENOUS | Status: AC
  Administered 2012-04-11: 147 mg via INTRAVENOUS
  Filled 2012-04-11: qty 7

## 2012-04-11 NOTE — Progress Notes (Signed)
OFFICE PROGRESS NOTE  CC  [name], MD [address] [place] [address] Dr. Emelia Loron Dr. Antony Blackbird  DIAGNOSIS:  78 year old female with new diagnosis of multifocal breast cancer of the left breast. Patient is status post mastectomy with sentinel lymph node biopsy performed on 12/18/2011.   PRIOR THERAPY: 1.Patient has had multiple biopsies performed including endoscopic ultrasound guided biopsies as well as an open biopsy without a definitive diagnosis  #2 patient now with new diagnosis of multifocal breast cancer of the left breast. She underwent a mammogram after she felt a mass in the left breast. The mammogram showed an abnormality. As a biopsy was performed and she was found to have an ace invasive ductal carcinoma. She has now had a mastectomy on 12/18/2011. The final pathology revealed 2 foci of well-differentiated invasive ductal carcinoma one measuring 1.7 cm and the second measuring 0.7 cm there was ductal carcinoma in situ lymphovascular invasion was also identified all surgical margins were negative for carcinoma. Patient went on to also have a left sentinel node biopsy one sentinel node was positive for metastatic disease with focal capsular extension. The tumor was estrogen receptor positive progesterone receptor positive. The largest tumor measuring 1.7 cm was HER-2/neu negative with a Ki-67 of 13%. The smaller tumor showed ER +100% PR receptor 7% proliferation marker 50% and it was HER-2/neu positive with a ratio 4.94.  #3 patient has been seen by radiation oncology and post mastectomy radiation therapy is recommended. At this time she will not get a left axillary lymph node dissection.  #4 patient's tumor is HER-2/neu positive and does she will receive adjuvant chemotherapy and Herceptin based therapy.  #5 patient will proceed with combination chemotherapy consisting of Taxol and Herceptin starting on 02/09/2012. A total of 12 weeks of combination  treatment is planned. Once she completes this she will undergo radiation therapy. After which she will receive Herceptin for one year with 5 years of letrozole 2.5 mg daily.  CURRENT THERAPY:Cycle 3 day 8 of  Herceptin we will discontinue Taxol.  INTERVAL HISTORY: Sibyle Mohammadi Mull 78 y.o. female returns for followup visit today. Overall, she is fatigued, but feeling slightly improved.  She is walking better.  She does report shortness of breath on exertion, that remains at her baseline.  She denies numbness or tingling in her extermeties, and is w/o complaints.  Her skin continues to be dry, and she continues to have a fine rash on her upper extremities.     MEDICAL HISTORY: Past Medical History  Diagnosis Date  . Asthma   . Hyperlipidemia     takes Simvastatin daily  . Hypertension     takes Amlodipine and Diovan daily  . Cancer     left breast  . Bronchitis     hx of;last time >18yr ago  . Arthritis     shoulders  . Dysphagia   . H/O hiatal hernia   . GERD (gastroesophageal reflux disease)     takes Prilosec prn  . Urinary urgency   . History of kidney stones     many yrs ago  . Depression     takes Celexa daily  . Insomnia     takes Ambien nightly  . Breast cancer 12/18/11    left breast masectomy=metastatic ca in (1/1) lymph node ,invasive ductal ca,2 foci,,dcis,lymph ovascular invasion identified,surgical resection margins neg for ca,additional tissue=benign skin and subcutaneous tissue  . Breast CA 11/23/11    left breast 3 o'clock bx=high grade  ductal ca in situ w/comedononecrosis and calcification,dcis,lymphovascular invasion present ,ER?PR+positive  . Full dentures     ALLERGIES:   has no known allergies.  MEDICATIONS:  Current Outpatient Prescriptions  Medication Sig Dispense Refill  . amLODipine (NORVASC) 10 MG tablet Take 10 mg by mouth daily.       . citalopram (CELEXA) 20 MG tablet Take 20 mg by mouth daily.      Marland Kitchen dexamethasone (DECADRON) 4 MG tablet Take 2  tablets by mouth daily starting the day after chemotherapy for 2 days. Take with food.  30 tablet  1  . DIOVAN HCT 160-12.5 MG per tablet Take 2 tablets by mouth every morning.       Marland Kitchen HYDROcodone-acetaminophen (NORCO) 5-325 MG per tablet       . lidocaine-prilocaine (EMLA) cream Apply topically as needed.  30 g  4  . LORazepam (ATIVAN) 0.5 MG tablet Take 1 tablet (0.5 mg total) by mouth every 6 (six) hours as needed (Nausea or vomiting).  30 tablet  0  . omeprazole (PRILOSEC) 20 MG capsule Take 20 mg by mouth daily.      . ondansetron (ZOFRAN) 8 MG tablet Take 1 tablet two times a day starting the day after chemo for 2 days. Then take 1 tablet two times a day as needed for nausea or vomiting.  30 tablet  1  . oxyCODONE (OXY IR/ROXICODONE) 5 MG immediate release tablet Take 1 tablet (5 mg total) by mouth every 6 (six) hours as needed for pain.  60 tablet  0  . potassium chloride SA (K-DUR,KLOR-CON) 20 MEQ tablet Take 1 tablet (20 mEq total) by mouth 2 (two) times daily.  20 tablet  0  . prochlorperazine (COMPAZINE) 10 MG tablet Take 1 tablet (10 mg total) by mouth every 6 (six) hours as needed (Nausea or vomiting).  30 tablet  1  . prochlorperazine (COMPAZINE) 25 MG suppository Place 1 suppository (25 mg total) rectally every 12 (twelve) hours as needed for nausea.  12 suppository  3  . simvastatin (ZOCOR) 20 MG tablet Take 20 mg by mouth daily.       Marland Kitchen zolpidem (AMBIEN) 10 MG tablet Take 10 mg by mouth at bedtime.       No current facility-administered medications for this visit.   Facility-Administered Medications Ordered in Other Visits  Medication Dose Route Frequency Provider Last Rate Last Dose  . sodium chloride 0.9 % injection 10 mL  10 mL Intracatheter PRN Victorino December, MD   10 mL at 03/14/12 1628    SURGICAL HISTORY:  Past Surgical History  Procedure Date  . Breast biopsy 1998    left  . Total shoulder replacement 2011    left  . Eus 06/04/2011    Procedure: UPPER ENDOSCOPIC  ULTRASOUND (EUS) LINEAR;  Surgeon: Rob Bunting, MD;  Location: [facility];  Service: Endoscopy;  Laterality: N/A;  . Exploratory laparotomy      biopsy of intra-abdominal mass  . Bladder tack   . Tubal ligation   . Appendectomy   . Dilation and curettage of uterus   . Esophagogastroduodenoscopy   . Mastectomy w/ sentinel node biopsy 12/18/2011    Procedure: MASTECTOMY WITH SENTINEL LYMPH NODE BIOPSY;  Surgeon: Emelia Loron, MD;  Location: [facility];  Service: General;  Laterality: Left;  . Portacath placement 01/27/2012    Procedure: INSERTION PORT-A-CATH;  Surgeon: Emelia Loron, MD;  Location: [facility];  Service: General;  Laterality: Right;  PORT PLACEMENT  REVIEW OF SYSTEMS:   General: fatigue (+), night sweats (-), fever (-), pain (-) Lymph: palpable nodes (-) HEENT: vision changes (-), mucositis (-), gum bleeding (-), epistaxis (-) Cardiovascular: chest pain (-), palpitations (-) Pulmonary: shortness of breath (-), dyspnea on exertion (+), cough (-), hemoptysis (-) GI:  Early satiety (-), melena (-), dysphagia (-), nausea/vomiting (-), diarrhea (-) GU: dysuria (-), hematuria (-), incontinence (-) Musculoskeletal: joint swelling (-), joint pain (-), back pain (-) Neuro: weakness (-), numbness (-), headache (-), confusion (-) Skin: Rash (-), lesions (-), dryness (-) Psych: depression (-), suicidal/homicidal ideation (-), feeling of hopelessness (-)  Health Maintenance  Mammogram: 11/2011 Colonoscopy: Never Bone Density Scan: Never Pap Smear: Years Eye Exam: 2 years ago Vitamin D Level: never Lipid Panel: 2011   PHYSICAL EXAMINATION:  BP 148/71  Pulse 103  Temp 97.7 F (36.5 C)  Resp 20  Ht 5\' 1"  (1.549 m)  Wt 171 lb (77.565 kg)  BMI 32.31 kg/m2 General: Patient is an elderly appearing female in no acute distress HEENT: PERRLA, sclerae anicteric no conjunctival pallor, MMM Neck: supple, no palpable adenopathy Lungs: clear to auscultation  bilaterally, no wheezes, rhonchi, or rales Cardiovascular: regular rate rhythm, S1, S2, systolic murmur, rubs or gallops Abdomen: Soft, non-tender, non-distended, normoactive bowel sounds, no HSM Extremities: warm and well perfused, no clubbing, cyanosis, or edema Skin: No rashes or lesions Neuro: Non-focal Breast: Left mastectomy no nodularity, or local sign of recurrence.  Right breast is without masses ECOG PERFORMANCE STATUS: 1 - Symptomatic but completely ambulatory  LABORATORY DATA: Lab Results  Component Value Date   WBC 10.8* 04/11/2012   HGB 10.8* 04/11/2012   HCT 32.6* 04/11/2012   MCV 84.0 04/11/2012   PLT 262 04/11/2012      Chemistry      Component Value Date/Time   NA 138 04/04/2012 1141   NA 139 02/22/2012 1148   K 3.5 04/04/2012 1141   K 4.0 02/22/2012 1148   CL 104 04/04/2012 1141   CL 106 02/22/2012 1148   CO2 24 04/04/2012 1141   CO2 27 02/22/2012 1148   BUN 12.0 04/04/2012 1141   BUN 15 02/22/2012 1148   CREATININE 0.6 04/04/2012 1141   CREATININE 0.52 02/22/2012 1148      Component Value Date/Time   CALCIUM 9.2 04/04/2012 1141   CALCIUM 8.6 02/22/2012 1148   ALKPHOS 77 04/04/2012 1141   ALKPHOS 59 02/22/2012 1148   AST 21 04/04/2012 1141   AST 21 02/22/2012 1148   ALT 40 04/04/2012 1141   ALT 30 02/22/2012 1148   BILITOT 0.40 04/04/2012 1141   BILITOT 0.2* 02/22/2012 1148    ADDITIONAL INFORMATION: 2. PROGNOSTIC INDICATORS - ACIS Results IMMUNOHISTOCHEMICAL AND MORPHOMETRIC ANALYSIS BY THE AUTOMATED CELLULAR IMAGING SYSTEM (ACIS) THE SMALLER TUMOR SHOWS THE FOLLOWING BREAST PROGNOSTIC PROFILE: Estrogen Receptor (Negative, <1%): 100%,POSITIVE, STRONG STAINING INTENSITY Progesterone Receptor (Negative, <1%): 7%,POSITIVE, STRONG STAINING INTENSITY Proliferation Marker Ki67 by M IB-1 (Low<20%): 50% All controls stained appropriately Abigail Miyamoto MD Pathologist, Electronic Signature ( Signed 12/25/2011) 2. CHROMOGENIC IN-SITU HYBRIDIZATION Interpretation: 0.7 CM  SMALLER TUMOR: HER2/NEU BY CISH - SHOWS AMPLIFICATION BY CISH ANALYSIS. THE RATIO OF HER2: CEP 17 SIGNALS WAS 4.94. 1.7 CM LEFT SUPERIOR TUMOR: HER-2/NEU BY CISH - NO AMPLIFICATION OF HER-2 DETECTED. THE RATIO OF HER-2: CEP 17 SIGNALS WAS 1.14. Reference range: Ratio: HER2:CEP17 < 1.8 - gene amplification not observed Ratio: HER2:CEP 17 1.8-2.2 - equivocal result Ratio: HER2:CEP17 > 2.2 - gene amplification observed 1 of  4 FINAL for LEANY, FRANCHINA ([id number]) ADDITIONAL INFORMATION:(continued) Comment: The cancer center was notified on 12-24-2011. Abigail Miyamoto MD Pathologist, Electronic Signature ( Signed 12/24/2011) FINAL DIAGNOSIS Diagnosis 1. Lymph node, sentinel, biopsy, Left - METASTATIC CARCINOMA IN 1 OF 1 LYMPH NODE WITH FOCAL CAPSULAR EXTENSION (1/1). 2. Breast, simple mastectomy, Left - INVASIVE DUCTAL CARCINOMA, TWO FOCI, WELL DIFFERENTIATED, SPANNING 1.7 AND 0.7 CM. - DUCTAL CARCINOMA IN SITU. - LYMPHOVASCULAR INVASION IS IDENTIFIED. - THE SURGICAL RESECTION MARGINS ARE NEGATIVE FOR CARCINOMA. - SEE ONCOLOGY TABLE BELOW. 3. Breast, biopsy, left, additional tissue - BENIGN SKIN AND SUBCUTANEOUS TISSUE. - THERE IS NO EVIDENCE OF MALIGNANCY. Microscopic Comment 2. BREAST, INVASIVE TUMOR, WITH LYMPH NODE SAMPLING Specimen, including laterality: Left breast. Procedure: Simple mastectomy Grade: Both are I Tubule formation: 1 and 1 Nuclear pleomorphism: 1 and 2 Mitotic:1 and 1 Tumor size (gross measurement): 1.7 and 0.7 cm Margins: Negative for carcinoma Invasive, distance to closest margin: 2.0 cm to the deep margin (gross measurement) In-situ, distance to closest margin: 2.0 cm from the deep margin (gross measurement). Lymphovascular invasion: Present. Ductal carcinoma in situ: Present. Grade: Intermediate grade. Extensive intraductal component: No. Lobular neoplasia: Not identified. Tumor focality: Two foci. Treatment effect: N/A Extent of tumor: Confined  to breast parenchyma Lymph nodes: # examined: 1 Lymph nodes with metastasis: 1 Isolated tumor cells (< 0.2 mm): 0 Micrometastasis: (> 0.2 mm and < 2.0 mm): 0 Macrometastasis: (> 2.0 mm): 1 Extracapsular extension: Present, focal. 2 of 4 FINAL for Sharpe, Roselle S [phone]) Microscopic Comment(continued) Breast prognostic profile: [phone] Estrogen receptor: Positive (100%, strong staining intensity). Progesterone receptor: Positive (100%, strong staining intensity. Her 2 neu: No amplification was detected. The ratio was 1.45. Her 2 neu by CISH will be repeated on both tumors and the results reported separately. Ki-67: 13% Non-neoplastic breast: Healing biopsy site. TNM: mpT1c, pN1a Comments: Grossly, there are two foci of grade I invasive ductal carcinoma present in the simple mastectomy. The larger nodule spans 1.7 cm and is histologically identical to the previous core biopsy, [id number] ("left superior"). The second nodule, 0.7 cm, is grade I invasive ductal carcinoma with extracellular mucin. A complete profile will be performed on this second, smaller nodule. (JBK:gt, 12/22/11) Pecola Leisure MD Pathologist, Electronic Signature   RADIOGRAPHIC STUDIES:  No results found.  ASSESSMENT: 78 year old female with:  #1 new diagnosis of stage II invasive ductal carcinoma of the left breast she is status post mastectomy with finding of 2 foci of invasive ductal carcinoma both are ER/PR positive but the smaller focus measuring 0.7 cm is HER-2/neu positive with a ratio over 4.  #2 patient was begun on adjuvant chemotherapy and Herceptin. With chemotherapy consisting of Taxol given weekly with weekly Herceptin. She received her first cycle on 02/08/2012.  We are starting every 21 day herceptin today due to side effects of Taxol.  #3 Anemia due to chemotherapy  PLAN: 1. Proceed with herceptin only today.  Discontinuing taxol due to significant side effects  2. Return in 3  week for follow up for q21d Herceptin.  I spent >25 minutes counseling the patient face to face. The total time spent in the appointment was 30 minutes.   Cherie Ouch Lyn Hollingshead, NP Medical Oncology [facility] Phone: [phone]  04/11/2012, 12:13 PM

## 2012-04-11 NOTE — Telephone Encounter (Signed)
Per staff message and POF I have scheduled appts.  JMW  

## 2012-04-11 NOTE — Patient Instructions (Signed)
[facility] Discharge Instructions for Patients Receiving Chemotherapy  Today you received the following chemotherapy agents Herceptin To help prevent nausea and vomiting after your treatment, we encourage you to take your nausea medication as prescribed.  If you develop nausea and vomiting that is not controlled by your nausea medication, call the clinic. If it is after clinic hours your family physician or the after hours number for the clinic or go to the Emergency Department.   BELOW ARE SYMPTOMS THAT SHOULD BE REPORTED IMMEDIATELY:  *FEVER GREATER THAN 100.5 F  *CHILLS WITH OR WITHOUT FEVER  NAUSEA AND VOMITING THAT IS NOT CONTROLLED WITH YOUR NAUSEA MEDICATION  *UNUSUAL SHORTNESS OF BREATH  *UNUSUAL BRUISING OR BLEEDING  TENDERNESS IN MOUTH AND THROAT WITH OR WITHOUT PRESENCE OF ULCERS  *URINARY PROBLEMS  *BOWEL PROBLEMS  UNUSUAL RASH Items with * indicate a potential emergency and should be followed up as soon as possible.  One of the nurses will contact you 24 hours after your treatment. Please let the nurse know about any problems that you may have experienced. Feel free to call the clinic you have any questions or concerns. The clinic phone number is [phone].   I have been informed and understand all the instructions given to me. I know to contact the clinic, my physician, or go to the Emergency Department if any problems should occur. I do not have any questions at this time, but understand that I may call the clinic during office hours   should I have any questions or need assistance in obtaining follow up care.    __________________________________________  _____________  __________ Signature of Patient or Authorized Representative            Date                   Time    __________________________________________ Nurse's Signature    

## 2012-04-11 NOTE — Patient Instructions (Signed)
Proceed with Herceptin today.  We will not give Taxol.  We have placed a referral for consultation with Dr. Roselind Messier in radiation oncology.  We will see you back in 3 weeks for labs, an appointment, and your Herceptin.    Please call us if you have any questions or concerns.

## 2012-04-11 NOTE — Telephone Encounter (Signed)
Pt's caregiver is aware to pick up an appt calendar from [facility] in the tx room. Sent michelle a staff message to add the chemo appts for three weeks.  Pt's caregiver has the rad onc appt with dr Mitzi Hansen. Pt has seen dr moody in the past

## 2012-04-13 ENCOUNTER — Encounter: Payer: Self-pay | Admitting: Radiation Oncology

## 2012-04-13 ENCOUNTER — Ambulatory Visit
Admission: RE | Admit: 2012-04-13 | Discharge: 2012-04-13 | Disposition: A | Discharge status: Home / Self Care | Payer: Medicare Other | Source: Ambulatory Visit | Attending: Radiation Oncology | Admitting: Radiation Oncology

## 2012-04-13 VITALS — BP 135/60 | HR 89 | Temp 97.6°F | Resp 18 | Ht 59.0 in | Wt 172.1 lb

## 2012-04-13 DIAGNOSIS — Z9010 Acquired absence of unspecified breast and nipple: Secondary | ICD-10-CM | POA: Insufficient documentation

## 2012-04-13 DIAGNOSIS — Z79899 Other long term (current) drug therapy: Secondary | ICD-10-CM | POA: Insufficient documentation

## 2012-04-13 DIAGNOSIS — C50419 Malignant neoplasm of upper-outer quadrant of unspecified female breast: Secondary | ICD-10-CM

## 2012-04-13 DIAGNOSIS — C50919 Malignant neoplasm of unspecified site of unspecified female breast: Secondary | ICD-10-CM | POA: Insufficient documentation

## 2012-04-13 DIAGNOSIS — Z510 Encounter for antineoplastic radiation therapy: Secondary | ICD-10-CM | POA: Insufficient documentation

## 2012-04-13 DIAGNOSIS — L988 Other specified disorders of the skin and subcutaneous tissue: Secondary | ICD-10-CM | POA: Insufficient documentation

## 2012-04-13 NOTE — Progress Notes (Signed)
See progress note under physician encounter. 

## 2012-04-13 NOTE — Progress Notes (Signed)
Patient presented to the clinic today accompanied by her daughter for a follow up new consult with Dr. Mitzi Hansen to discuss the role of radiation therapy in the treatment of left breast ca. Patient is alert and oriented to person, place, and time. No distress noted. Slow steady gait noted with assistance of cane. Patient denies pain at this time. Patient denies breast pain at this time. However, patient states "my breast has not completely healed from surgery yet and remains tender." patient reports that the surgical incisions on her left breast are well approximated without redness, drainage or edema. Patient denies fever in her left breast. Patient denies nipple discharge. Patient denies difficulty sleeping so long as she takes her [place]. Patient reports that her weight has remained stable and that she has a good appetite. Patient denies nausea, vomiting, headache or dizziness. Reported all findings to Dr. Mitzi Hansen.  NKDA No hx of radiation therapy  Denies having a pacemaker

## 2012-04-13 NOTE — Progress Notes (Signed)
Radiation Oncology         [phone] ________________________________  Name: Brandy [name] MRN: [id number]  Date: 04/13/2012  DOB: [name] 17, 1934  Follow-Up Visit Note  CC: Brandy Labella, MD  Brandy December, MD  Diagnosis:   Invasive ductal carcinoma of the left breast status post  Simple mastectomy: T1 C. N1 A. M0  Interval Since Last Radiation:  Not applicable   Narrative:  The patient returns today for routine follow-up.  The patient has finished the main component of her adjuvant chemotherapy and returns today to further discuss and coordinate an anticipated course of postmastectomy radiotherapy. As noted above the patient is status post a simple mastectomy on the left and I do recommend adjuvant radiation treatment. She has been receiving adjuvant chemotherapy through Dr. Park [name]. She feels that this went well overall but she did have some difficulties with the fatigue, skin changes, and some swelling. She is proceeding with Herceptin but Taxol has been discontinued due to significant side effects. The patient therefore is appropriate to proceed with adjuvant radiotherapy at this time. She notes no difficulties in terms of her surgical site.                              ALLERGIES:   has no known allergies.  Meds: Current Outpatient Prescriptions  Medication Sig Dispense Refill  . amLODipine (NORVASC) 10 MG tablet Take 10 mg by mouth daily.       . citalopram (CELEXA) 20 MG tablet Take 20 mg by mouth daily.      Marland Kitchen DIOVAN HCT 160-12.5 MG per tablet Take 2 tablets by mouth every morning.       . lidocaine-prilocaine (EMLA) cream Apply topically as needed.  30 g  4  . LORazepam (ATIVAN) 0.5 MG tablet Take 1 tablet (0.5 mg total) by mouth every 6 (six) hours as needed (Nausea or vomiting).  30 tablet  0  . omeprazole (PRILOSEC) 20 MG capsule Take 20 mg by mouth daily.      Marland Kitchen oxyCODONE (OXY IR/ROXICODONE) 5 MG immediate release tablet Take 1 tablet (5 mg total) by mouth every 6 (six)  hours as needed for pain.  60 tablet  0  . potassium chloride SA (K-DUR,KLOR-CON) 20 MEQ tablet Take 1 tablet (20 mEq total) by mouth 2 (two) times daily.  20 tablet  0  . simvastatin (ZOCOR) 20 MG tablet Take 20 mg by mouth daily.       Marland Kitchen zolpidem (AMBIEN) 10 MG tablet Take 10 mg by mouth at bedtime.      Marland Kitchen dexamethasone (DECADRON) 4 MG tablet Take 2 tablets by mouth daily starting the day after chemotherapy for 2 days. Take with food.  30 tablet  1  . HYDROcodone-acetaminophen (NORCO) 5-325 MG per tablet       . ondansetron (ZOFRAN) 8 MG tablet Take 1 tablet two times a day starting the day after chemo for 2 days. Then take 1 tablet two times a day as needed for nausea or vomiting.  30 tablet  1  . prochlorperazine (COMPAZINE) 10 MG tablet Take 1 tablet (10 mg total) by mouth every 6 (six) hours as needed (Nausea or vomiting).  30 tablet  1  . prochlorperazine (COMPAZINE) 25 MG suppository Place 1 suppository (25 mg total) rectally every 12 (twelve) hours as needed for nausea.  12 suppository  3   No current facility-administered medications for this encounter.  Facility-Administered Medications Ordered in Other Encounters  Medication Dose Route Frequency Provider Last Rate Last Dose  . sodium chloride 0.9 % injection 10 mL  10 mL Intracatheter PRN Brandy December, MD   10 mL at 03/14/12 1628    Physical Findings: The patient is in no acute distress. Patient is alert and oriented.  height is 4\' 11"  (1.499 m) and weight is 172 lb 1.6 oz (78.064 kg). Her oral temperature is 97.6 F (36.4 C). Her blood pressure is 135/60 and her pulse is 89. Her respiration is 18 and oxygen saturation is 100%. .   Chest wall: Well-healed surgical incision present, mastectomy scar. No lesion/nodules in the chest wall area. No axillary adenopathy on the left.  Lab Findings: Lab Results  Component Value Date   WBC 10.8* 04/11/2012   HGB 10.8* 04/11/2012   HCT 32.6* 04/11/2012   MCV 84.0 04/11/2012   PLT 262  04/11/2012     Radiographic Findings: No results found.  Impression:     78 year old female status post simple mastectomy on the left for invasive ductal carcinoma, has completed adjuvant chemotherapy with the exception of Herceptin which she will continue with. She is appropriate to proceed with postmastectomy radiation.  Plan:   I discussed with the patient what would be entailed in a typical course of radiotherapy for her presentation. This would consist of 6-1/2 weeks. We had a detailed discussion of the possible side effects and risks of treatment, as well as the anticipated benefit in terms of local/regional control. All of the patient's questions were answered.  The patient indicates that she wishes to proceed with radiation. A simulation will be scheduled such that we can proceed with treatment planning.  I spent 25 minutes with the patient today, the majority of which was spent counseling the patient on the diagnosis of cancer and coordinating care.   Radene Gunning, M.D., Ph.D.

## 2012-04-13 NOTE — Progress Notes (Signed)
Complete PATIENT MEASURE OF DISTRESS worksheet with a score of 8 submitted to social work.  

## 2012-04-18 ENCOUNTER — Ambulatory Visit: Payer: Medicare Other

## 2012-04-18 ENCOUNTER — Ambulatory Visit
Admission: RE | Admit: 2012-04-18 | Discharge: 2012-04-18 | Disposition: A | Discharge status: Home / Self Care | Payer: Medicare Other | Source: Ambulatory Visit | Attending: Radiation Oncology | Admitting: Radiation Oncology

## 2012-04-18 DIAGNOSIS — C50419 Malignant neoplasm of upper-outer quadrant of unspecified female breast: Secondary | ICD-10-CM

## 2012-04-18 NOTE — Progress Notes (Signed)
Met with patient to discuss RO billing. Pt had financial concerns and was given and EPP, MCD application, as well as ACS referral.  Dx: 174.4 Upper-outer quadrant of breast  Attending Rad: Dr. Sanjuana Letters Tx: [phone] [address]am

## 2012-04-20 NOTE — Progress Notes (Signed)
  Radiation Oncology         [phone] ________________________________  Name: Brandy [name] MRN: [id number]  Date: 04/18/2012  DOB: [birth year]   SIMULATION AND TREATMENT PLANNING NOTE  The patient presented for simulation prior to beginning her course of radiation treatment for her diagnosis of left-sided breast cancer. The patient was placed in a supine position on a breast board. A customized accuform device was also constructed and this complex treatment device will be used on a daily basis during her treatment. In this fashion, a CT scan was obtained through the chest area and an isocenter was placed near the chest wall at the upper aspect of the right chest.  A breath-hold technique was also evaluated and I believe that this may be helpful for her treatment to reduce the dose to the heart. This will be further evaluated during the treatment planning process. The patient was given instructions with regards to practicing this technique at home prior to her first treatment.  The patient will be planned to receive a course of radiation initially to a dose of 50.4 gray. This will consist of a 4 field technique targeting the chest wall as well as the supraclavicular region. Therefore 2 customized medial and lateral tangent fields have been created targeting the chest wall, and also 2 additional customized fields have been designed to treat the supraclavicular region both with a right supraclavicular field and a right posterior axillary boost field. This treatment will be accomplished at 1.8 gray per fraction. A 3-D conformal technique will be used for this treatment. Several target areas have been contoured including the axilla region and the scar region in addition to the internal mammary lymph nodes. Additionally the heart and lungs have been contoured as normal avoidance structures and the dose volume histograms of each of these structures will be reviewed as part of the 3-D conformal treatment  planning process.. A forward planning technique will also be evaluated to determine if this approach improves the plan.   It is anticipated that the patient will then receive a 10 gray boost to the scar plus margin. This will be accomplished at 2 gray per fraction. The final anticipated total dose therefore will correspond to 60.4 gray.  Special treatment procedure The patient will receive chemotherapy (herceptin) during the course of radiation treatment. The patient may experience increased or overlapping toxicity due to this combined-modality approach and the patient will be monitored for such problems. This may include extra lab work as necessary. This therefore constitutes a special treatment procedure.    _______________________________   Radene Gunning, MD, PhD

## 2012-04-25 ENCOUNTER — Ambulatory Visit: Payer: Medicare Other

## 2012-04-25 ENCOUNTER — Ambulatory Visit
Admission: RE | Admit: 2012-04-25 | Discharge: 2012-04-25 | Disposition: A | Discharge status: Home / Self Care | Payer: Medicare Other | Source: Ambulatory Visit | Attending: Radiation Oncology | Admitting: Radiation Oncology

## 2012-04-25 ENCOUNTER — Encounter: Payer: Self-pay | Admitting: Radiation Oncology

## 2012-04-25 ENCOUNTER — Other Ambulatory Visit: Payer: Self-pay | Admitting: Oncology

## 2012-04-25 ENCOUNTER — Other Ambulatory Visit: Payer: Medicare Other | Admitting: Lab

## 2012-04-25 ENCOUNTER — Telehealth: Payer: Self-pay | Admitting: *Deleted

## 2012-04-25 DIAGNOSIS — G8929 Other chronic pain: Secondary | ICD-10-CM

## 2012-04-25 MED ORDER — OXYCODONE HCL 5 MG PO TABS: 5.0000 mg | ORAL_TABLET | ORAL | Status: DC | PRN

## 2012-04-25 NOTE — Telephone Encounter (Signed)
Pt called requesting refill on oxycodone 5mg . Pt advised she will be here tomorrow for radiation and would like to pick it up at that time. Last filled 03/28/12 Q-60 pt to take 1 q 6hrs for pain. WIll review with MD.

## 2012-04-25 NOTE — Telephone Encounter (Signed)
Prescription written

## 2012-04-26 ENCOUNTER — Ambulatory Visit
Admission: RE | Admit: 2012-04-26 | Discharge: 2012-04-26 | Disposition: A | Discharge status: Home / Self Care | Payer: Medicare Other | Source: Ambulatory Visit | Attending: Radiation Oncology | Admitting: Radiation Oncology

## 2012-04-27 ENCOUNTER — Ambulatory Visit: Payer: Medicare Other

## 2012-04-27 ENCOUNTER — Ambulatory Visit
Admission: RE | Admit: 2012-04-27 | Discharge: 2012-04-27 | Disposition: A | Discharge status: Home / Self Care | Payer: Medicare Other | Source: Ambulatory Visit | Attending: Radiation Oncology | Admitting: Radiation Oncology

## 2012-04-28 ENCOUNTER — Ambulatory Visit: Payer: Medicare Other

## 2012-04-28 ENCOUNTER — Ambulatory Visit
Admission: RE | Admit: 2012-04-28 | Discharge: 2012-04-28 | Disposition: A | Discharge status: Home / Self Care | Payer: Medicare Other | Source: Ambulatory Visit | Attending: Radiation Oncology | Admitting: Radiation Oncology

## 2012-04-29 ENCOUNTER — Ambulatory Visit
Admission: RE | Admit: 2012-04-29 | Discharge: 2012-04-29 | Disposition: A | Discharge status: Home / Self Care | Payer: Medicare Other | Source: Ambulatory Visit | Attending: Radiation Oncology | Admitting: Radiation Oncology

## 2012-04-29 ENCOUNTER — Encounter: Payer: Self-pay | Admitting: Radiation Oncology

## 2012-04-29 ENCOUNTER — Ambulatory Visit: Payer: Medicare Other

## 2012-04-29 VITALS — BP 137/53 | HR 73 | Temp 98.6°F | Resp 20 | Wt 167.8 lb

## 2012-04-29 DIAGNOSIS — C50419 Malignant neoplasm of upper-outer quadrant of unspecified female breast: Secondary | ICD-10-CM

## 2012-04-29 NOTE — Progress Notes (Signed)
Post sim ed completed w/pt. "Radiation and You" booklet not available; gave pt handouts, discussed skin irritation, fatigue, pain, nutrition. All questions answered. Gave pt radiaplex, Alra w/instrucitons for proper use. Pt verbalized teach back. Pt denies pain, fatigue, loss of appetite.

## 2012-04-29 NOTE — Progress Notes (Signed)
Department of Radiation Oncology  Phone:  [phone] Fax:        [phone]  Weekly Treatment Note    Name: Brandy [name] Date: 04/29/2012 MRN: [id number] DOB: January 14, 1933   Current dose: 7.2 Gy  Current fraction: 4   MEDICATIONS: Current Outpatient Prescriptions  Medication Sig Dispense Refill  . amLODipine (NORVASC) 10 MG tablet Take 10 mg by mouth daily.       . citalopram (CELEXA) 20 MG tablet Take 20 mg by mouth daily.      Marland Kitchen dexamethasone (DECADRON) 4 MG tablet Take 2 tablets by mouth daily starting the day after chemotherapy for 2 days. Take with food.  30 tablet  1  . DIOVAN HCT 160-12.5 MG per tablet Take 2 tablets by mouth every morning.       . hyaluronate sodium (RADIAPLEXRX) GEL Apply topically 2 (two) times daily.      Marland Kitchen HYDROcodone-acetaminophen (NORCO) 5-325 MG per tablet       . lidocaine-prilocaine (EMLA) cream Apply topically as needed.  30 g  4  . LORazepam (ATIVAN) 0.5 MG tablet Take 1 tablet (0.5 mg total) by mouth every 6 (six) hours as needed (Nausea or vomiting).  30 tablet  0  . non-metallic deodorant (ALRA) MISC Apply 1 application topically daily as needed.      Marland Kitchen omeprazole (PRILOSEC) 20 MG capsule Take 20 mg by mouth daily.      . ondansetron (ZOFRAN) 8 MG tablet Take 1 tablet two times a day starting the day after chemo for 2 days. Then take 1 tablet two times a day as needed for nausea or vomiting.  30 tablet  1  . oxyCODONE (OXY IR/ROXICODONE) 5 MG immediate release tablet Take 1 tablet (5 mg total) by mouth every 6 (six) hours as needed for pain.  60 tablet  0  . oxyCODONE (OXY IR/ROXICODONE) 5 MG immediate release tablet Take 1 tablet (5 mg total) by mouth every 4 (four) hours as needed for pain.  90 tablet  0  . potassium chloride SA (K-DUR,KLOR-CON) 20 MEQ tablet Take 1 tablet (20 mEq total) by mouth 2 (two) times daily.  20 tablet  0  . prochlorperazine (COMPAZINE) 10 MG tablet Take 1 tablet (10 mg total) by mouth every 6 (six) hours as  needed (Nausea or vomiting).  30 tablet  1  . prochlorperazine (COMPAZINE) 25 MG suppository Place 1 suppository (25 mg total) rectally every 12 (twelve) hours as needed for nausea.  12 suppository  3  . simvastatin (ZOCOR) 20 MG tablet Take 20 mg by mouth daily.       Marland Kitchen zolpidem (AMBIEN) 10 MG tablet Take 10 mg by mouth at bedtime.       No current facility-administered medications for this encounter.   Facility-Administered Medications Ordered in Other Encounters  Medication Dose Route Frequency Provider Last Rate Last Dose  . sodium chloride 0.9 % injection 10 mL  10 mL Intracatheter PRN Victorino December, MD   10 mL at 03/14/12 1628     ALLERGIES: Review of patient's allergies indicates no known allergies.   LABORATORY DATA:  Lab Results  Component Value Date   WBC 10.8* 04/11/2012   HGB 10.8* 04/11/2012   HCT 32.6* 04/11/2012   MCV 84.0 04/11/2012   PLT 262 04/11/2012   Lab Results  Component Value Date   NA 136 04/11/2012   K 3.8 04/11/2012   CL 102 04/11/2012   CO2 23 04/11/2012   Lab  Results  Component Value Date   ALT 38 04/11/2012   AST 23 04/11/2012   ALKPHOS 80 04/11/2012   BILITOT 0.50 04/11/2012     NARRATIVE: Brandy [name] was seen today for weekly treatment management. The chart was checked and the patient's films were reviewed. The patient is doing well. No problems so far. She has undergone opposed simulation education. Will begin skin cream use daily.  PHYSICAL EXAMINATION: weight is 167 lb 12.8 oz (76.114 kg). Her oral temperature is 98.6 F (37 C). Her blood pressure is 137/53 and her pulse is 73. Her respiration is 20.        ASSESSMENT: The patient is doing satisfactorily with treatment.  PLAN: We will continue with the patient's radiation treatment as planned.

## 2012-05-02 ENCOUNTER — Ambulatory Visit: Payer: Medicare Other

## 2012-05-02 ENCOUNTER — Other Ambulatory Visit (HOSPITAL_BASED_OUTPATIENT_CLINIC_OR_DEPARTMENT_OTHER): Payer: Medicare Other | Admitting: Lab

## 2012-05-02 ENCOUNTER — Telehealth: Payer: Self-pay | Admitting: *Deleted

## 2012-05-02 ENCOUNTER — Encounter: Payer: Self-pay | Admitting: Adult Health

## 2012-05-02 ENCOUNTER — Ambulatory Visit (HOSPITAL_BASED_OUTPATIENT_CLINIC_OR_DEPARTMENT_OTHER): Payer: Medicare Other

## 2012-05-02 ENCOUNTER — Ambulatory Visit
Admission: RE | Admit: 2012-05-02 | Discharge: 2012-05-02 | Disposition: A | Discharge status: Home / Self Care | Payer: Medicare Other | Source: Ambulatory Visit | Attending: Radiation Oncology | Admitting: Radiation Oncology

## 2012-05-02 ENCOUNTER — Ambulatory Visit (HOSPITAL_BASED_OUTPATIENT_CLINIC_OR_DEPARTMENT_OTHER): Payer: Medicare Other | Admitting: Adult Health

## 2012-05-02 VITALS — BP 129/66 | HR 92 | Temp 98.1°F | Resp 20 | Ht 59.0 in | Wt 166.0 lb

## 2012-05-02 DIAGNOSIS — C50919 Malignant neoplasm of unspecified site of unspecified female breast: Secondary | ICD-10-CM

## 2012-05-02 DIAGNOSIS — Z5112 Encounter for antineoplastic immunotherapy: Secondary | ICD-10-CM

## 2012-05-02 DIAGNOSIS — D6481 Anemia due to antineoplastic chemotherapy: Secondary | ICD-10-CM

## 2012-05-02 DIAGNOSIS — Z79899 Other long term (current) drug therapy: Secondary | ICD-10-CM

## 2012-05-02 DIAGNOSIS — C50419 Malignant neoplasm of upper-outer quadrant of unspecified female breast: Secondary | ICD-10-CM

## 2012-05-02 DIAGNOSIS — Z170 Estrogen receptor positive status [ER+]: Secondary | ICD-10-CM

## 2012-05-02 LAB — CBC WITH DIFFERENTIAL/PLATELET
Basophils Absolute: 0.1 10*3/uL (ref 0.0–0.1)
Eosinophils Absolute: 0.2 10*3/uL (ref 0.0–0.5)
HCT: 35.1 % (ref 34.8–46.6)
HGB: 11.4 g/dL — ABNORMAL LOW (ref 11.6–15.9)
MCH: 27.6 pg (ref 25.1–34.0)
MONO#: 0.8 10*3/uL (ref 0.1–0.9)
NEUT#: 5 10*3/uL (ref 1.5–6.5)
NEUT%: 53.8 % (ref 38.4–76.8)
WBC: 9.3 10*3/uL (ref 3.9–10.3)
lymph#: 3.3 10*3/uL (ref 0.9–3.3)

## 2012-05-02 LAB — COMPREHENSIVE METABOLIC PANEL (CC13)
Albumin: 3.5 g/dL (ref 3.5–5.0)
BUN: 19 mg/dL (ref 7.0–26.0)
Calcium: 9.7 mg/dL (ref 8.4–10.4)
Chloride: 104 mEq/L (ref 98–107)
Glucose: 106 mg/dl — ABNORMAL HIGH (ref 70–99)
Potassium: 3.8 mEq/L (ref 3.5–5.1)
Sodium: 138 mEq/L (ref 136–145)
Total Protein: 6.6 g/dL (ref 6.4–8.3)

## 2012-05-02 MED ORDER — DIPHENHYDRAMINE HCL 25 MG PO CAPS
50.0000 mg | ORAL_CAPSULE | Freq: Once | ORAL | Status: AC
  Administered 2012-05-02: 50 mg via ORAL

## 2012-05-02 MED ORDER — HEPARIN SOD (PORK) LOCK FLUSH 100 UNIT/ML IV SOLN
500.0000 [IU] | Freq: Once | INTRAVENOUS | Status: AC | PRN
  Administered 2012-05-02: 500 [IU]
  Filled 2012-05-02: qty 5

## 2012-05-02 MED ORDER — ACETAMINOPHEN 325 MG PO TABS
650.0000 mg | ORAL_TABLET | Freq: Once | ORAL | Status: AC
  Administered 2012-05-02: 650 mg via ORAL

## 2012-05-02 MED ORDER — TRASTUZUMAB CHEMO INJECTION 440 MG
6.0000 mg/kg | Freq: Once | INTRAVENOUS | Status: AC
  Administered 2012-05-02: 462 mg via INTRAVENOUS
  Filled 2012-05-02: qty 22

## 2012-05-02 MED ORDER — SODIUM CHLORIDE 0.9 % IV SOLN: Freq: Once | INTRAVENOUS | Status: DC

## 2012-05-02 MED ORDER — SODIUM CHLORIDE 0.9 % IJ SOLN
10.0000 mL | INTRAMUSCULAR | Status: DC | PRN
  Administered 2012-05-02: 10 mL
  Filled 2012-05-02: qty 10

## 2012-05-02 NOTE — Telephone Encounter (Signed)
Per staff message and POF I have scheduled appts.  JMW  

## 2012-05-02 NOTE — Patient Instructions (Signed)
[facility] Discharge Instructions for Patients Receiving Chemotherapy  Today you received the following chemotherapy agents Herceptin To help prevent nausea and vomiting after your treatment, we encourage you to take your nausea medication as prescribed.  If you develop nausea and vomiting that is not controlled by your nausea medication, call the clinic. If it is after clinic hours your family physician or the after hours number for the clinic or go to the Emergency Department.   BELOW ARE SYMPTOMS THAT SHOULD BE REPORTED IMMEDIATELY:  *FEVER GREATER THAN 100.5 F  *CHILLS WITH OR WITHOUT FEVER  NAUSEA AND VOMITING THAT IS NOT CONTROLLED WITH YOUR NAUSEA MEDICATION  *UNUSUAL SHORTNESS OF BREATH  *UNUSUAL BRUISING OR BLEEDING  TENDERNESS IN MOUTH AND THROAT WITH OR WITHOUT PRESENCE OF ULCERS  *URINARY PROBLEMS  *BOWEL PROBLEMS  UNUSUAL RASH Items with * indicate a potential emergency and should be followed up as soon as possible.  One of the nurses will contact you 24 hours after your treatment. Please let the nurse know about any problems that you may have experienced. Feel free to call the clinic you have any questions or concerns. The clinic phone number is [phone].   I have been informed and understand all the instructions given to me. I know to contact the clinic, my physician, or go to the Emergency Department if any problems should occur. I do not have any questions at this time, but understand that I may call the clinic during office hours   should I have any questions or need assistance in obtaining follow up care.    __________________________________________  _____________  __________ Signature of Patient or Authorized Representative            Date                   Time    __________________________________________ Nurse's Signature    

## 2012-05-02 NOTE — Patient Instructions (Signed)
Doing well.  Proceed with Herceptin today.

## 2012-05-02 NOTE — Telephone Encounter (Signed)
Gave patient appointment for 05-23-2012 06-13-2012 07-04-2012  Sent michelle email to add on treatment to the above dates  Made patient appointment for echo and bensihomn

## 2012-05-02 NOTE — Progress Notes (Signed)
OFFICE PROGRESS NOTE  CC  [name], MD [address] [place] [address] Dr. Emelia Loron Dr. Antony Blackbird  DIAGNOSIS:  78 year old female with new diagnosis of multifocal breast cancer of the left breast. Patient is status post mastectomy with sentinel lymph node biopsy performed on 12/18/2011.   PRIOR THERAPY: 1.Patient has had multiple biopsies performed including endoscopic ultrasound guided biopsies as well as an open biopsy without a definitive diagnosis  #2 patient now with new diagnosis of multifocal breast cancer of the left breast. She underwent a mammogram after she felt a mass in the left breast. The mammogram showed an abnormality. As a biopsy was performed and she was found to have an ace invasive ductal carcinoma. She has now had a mastectomy on 12/18/2011. The final pathology revealed 2 foci of well-differentiated invasive ductal carcinoma one measuring 1.7 cm and the second measuring 0.7 cm there was ductal carcinoma in situ lymphovascular invasion was also identified all surgical margins were negative for carcinoma. Patient went on to also have a left sentinel node biopsy one sentinel node was positive for metastatic disease with focal capsular extension. The tumor was estrogen receptor positive progesterone receptor positive. The largest tumor measuring 1.7 cm was HER-2/neu negative with a Ki-67 of 13%. The smaller tumor showed ER +100% PR receptor 7% proliferation marker 50% and it was HER-2/neu positive with a ratio 4.94.  #3 patient has been seen by radiation oncology and post mastectomy radiation therapy is recommended. At this time she will not get a left axillary lymph node dissection.  #4 [name] tumor is HER-2/neu positive and does she will receive adjuvant chemotherapy and Herceptin based therapy.  #5 patient will proceed with combination chemotherapy consisting of Taxol and Herceptin starting on 02/09/2012. A total of 12 weeks of combination  treatment is planned. Once she completes this she will undergo radiation therapy. After which she will receive Herceptin for one year with 5 years of letrozole 2.5 mg daily.  CURRENT THERAPY: Q 3 week herceptin and radiation therapy.    INTERVAL HISTORY: Brandy [name] 78 y.o. female returns for followup visit today. She is feeling well today.  She is ready for her next treatment.  She recently received a call from Dr. Prescott Gum office stating she would have a f/u appt on 12/9.  She is w/o complaints or questions.     MEDICAL HISTORY: Past Medical History  Diagnosis Date  . Asthma   . Hyperlipidemia     takes Simvastatin daily  . Hypertension     takes Amlodipine and Diovan daily  . Cancer     left breast  . Bronchitis     hx of;last time >76yr ago  . Arthritis     shoulders  . Dysphagia   . H/O hiatal hernia   . GERD (gastroesophageal reflux disease)     takes Prilosec prn  . Urinary urgency   . History of kidney stones     many yrs ago  . Depression     takes Celexa daily  . Insomnia     takes Ambien nightly  . Breast cancer 12/18/11    left breast masectomy=metastatic ca in (1/1) lymph node ,invasive ductal ca,2 foci,,dcis,lymph ovascular invasion identified,surgical resection margins neg for ca,additional tissue=benign skin and subcutaneous tissue  . Breast CA 11/23/11    left breast 3 o'clock bx=high grade ductal ca in situ w/comedononecrosis and calcification,dcis,lymphovascular invasion present ,ER?PR+positive  . Full dentures     ALLERGIES:  has no known allergies.  MEDICATIONS:  Current Outpatient Prescriptions  Medication Sig Dispense Refill  . amLODipine (NORVASC) 10 MG tablet Take 10 mg by mouth daily.       . citalopram (CELEXA) 20 MG tablet Take 20 mg by mouth daily.      Marland Kitchen dexamethasone (DECADRON) 4 MG tablet Take 2 tablets by mouth daily starting the day after chemotherapy for 2 days. Take with food.  30 tablet  1  . DIOVAN HCT 160-12.5 MG per tablet  Take 2 tablets by mouth every morning.       . hyaluronate sodium (RADIAPLEXRX) GEL Apply topically 2 (two) times daily.      Marland Kitchen HYDROcodone-acetaminophen (NORCO) 5-325 MG per tablet       . lidocaine-prilocaine (EMLA) cream Apply topically as needed.  30 g  4  . LORazepam (ATIVAN) 0.5 MG tablet Take 1 tablet (0.5 mg total) by mouth every 6 (six) hours as needed (Nausea or vomiting).  30 tablet  0  . non-metallic deodorant (ALRA) MISC Apply 1 application topically daily as needed.      Marland Kitchen omeprazole (PRILOSEC) 20 MG capsule Take 20 mg by mouth daily.      . ondansetron (ZOFRAN) 8 MG tablet Take 1 tablet two times a day starting the day after chemo for 2 days. Then take 1 tablet two times a day as needed for nausea or vomiting.  30 tablet  1  . oxyCODONE (OXY IR/ROXICODONE) 5 MG immediate release tablet Take 1 tablet (5 mg total) by mouth every 6 (six) hours as needed for pain.  60 tablet  0  . oxyCODONE (OXY IR/ROXICODONE) 5 MG immediate release tablet Take 1 tablet (5 mg total) by mouth every 4 (four) hours as needed for pain.  90 tablet  0  . potassium chloride SA (K-DUR,KLOR-CON) 20 MEQ tablet Take 1 tablet (20 mEq total) by mouth 2 (two) times daily.  20 tablet  0  . prochlorperazine (COMPAZINE) 10 MG tablet Take 1 tablet (10 mg total) by mouth every 6 (six) hours as needed (Nausea or vomiting).  30 tablet  1  . prochlorperazine (COMPAZINE) 25 MG suppository Place 1 suppository (25 mg total) rectally every 12 (twelve) hours as needed for nausea.  12 suppository  3  . simvastatin (ZOCOR) 20 MG tablet Take 20 mg by mouth daily.       Marland Kitchen zolpidem (AMBIEN) 10 MG tablet Take 10 mg by mouth at bedtime.       No current facility-administered medications for this visit.   Facility-Administered Medications Ordered in Other Visits  Medication Dose Route Frequency Provider Last Rate Last Dose  . 0.9 %  sodium chloride infusion   Intravenous Once Victorino December, MD      . acetaminophen (TYLENOL) tablet 650  mg  650 mg Oral Once Victorino December, MD   650 mg at 05/02/12 1428  . diphenhydrAMINE (BENADRYL) capsule 50 mg  50 mg Oral Once Victorino December, MD   50 mg at 05/02/12 1428  . heparin lock flush 100 unit/mL  500 Units Intracatheter Once PRN Victorino December, MD      . sodium chloride 0.9 % injection 10 mL  10 mL Intracatheter PRN Victorino December, MD   10 mL at 03/14/12 1628  . sodium chloride 0.9 % injection 10 mL  10 mL Intracatheter PRN Victorino December, MD      . trastuzumab (HERCEPTIN) 462 mg in sodium chloride 0.9 %  250 mL chemo infusion  6 mg/kg (Treatment Plan Actual) Intravenous Once Victorino December, MD        SURGICAL HISTORY:  Past Surgical History  Procedure Date  . Breast biopsy 1998    left  . Total shoulder replacement 2011    left  . Eus 06/04/2011    Procedure: UPPER ENDOSCOPIC ULTRASOUND (EUS) LINEAR;  Surgeon: Rob Bunting, MD;  Location: [facility];  Service: Endoscopy;  Laterality: N/A;  . Exploratory laparotomy      biopsy of intra-abdominal mass  . Bladder tack   . Tubal ligation   . Appendectomy   . Dilation and curettage of uterus   . Esophagogastroduodenoscopy   . Mastectomy w/ sentinel node biopsy 12/18/2011    Procedure: MASTECTOMY WITH SENTINEL LYMPH NODE BIOPSY;  Surgeon: Emelia Loron, MD;  Location[facility];  Service: General;  Laterality: Left;  . Portacath placement 01/27/2012    Procedure: INSERTION PORT-A-CATH;  Surgeon: Emelia Loron, MD;  Location: [facility];  Service: General;  Laterality: Right;  PORT PLACEMENT    REVIEW OF SYSTEMS:   General: fatigue (+), night sweats (-), fever (-), pain (-) Lymph: palpable nodes (-) HEENT: vision changes (-), mucositis (-), gum bleeding (-), epistaxis (-) Cardiovascular: chest pain (-), palpitations (-) Pulmonary: shortness of breath (-), dyspnea on exertion (+), cough (-), hemoptysis (-) GI:  Early satiety (-), melena (-), dysphagia (-), nausea/vomiting (-), diarrhea (-) GU: dysuria (-),  hematuria (-), incontinence (-) Musculoskeletal: joint swelling (-), joint pain (-), back pain (-) Neuro: weakness (-), numbness (-), headache (-), confusion (-) Skin: Rash (-), lesions (-), dryness (-) Psych: depression (-), suicidal/homicidal ideation (-), feeling of hopelessness (-)  Health Maintenance  Mammogram: 11/2011 Colonoscopy: Never Bone Density Scan: Never Pap Smear: Years Eye Exam: 2 years ago Vitamin D Level: never Lipid Panel: 2011   PHYSICAL EXAMINATION:  BP 129/66  Pulse 92  Temp 98.1 F (36.7 C) (Oral)  Resp 20  Ht 4\' 11"  (1.499 m)  Wt 166 lb (75.297 kg)  BMI 33.53 kg/m2 General: Patient is an elderly appearing female in no acute distress HEENT: PERRLA, sclerae anicteric no conjunctival pallor, MMM Neck: supple, no palpable adenopathy Lungs: clear to auscultation bilaterally, no wheezes, rhonchi, or rales Cardiovascular: regular rate rhythm, S1, S2, systolic murmur, rubs or gallops Abdomen: Soft, non-tender, non-distended, normoactive bowel sounds, no HSM Extremities: warm and well perfused, no clubbing, cyanosis, or edema Skin: No rashes or lesions Neuro: Non-focal Breast: Left mastectomy no nodularity, or local sign of recurrence.  Right breast is without masses ECOG PERFORMANCE STATUS: 1 - Symptomatic but completely ambulatory  LABORATORY DATA: Lab Results  Component Value Date   WBC 9.3 05/02/2012   HGB 11.4* 05/02/2012   HCT 35.1 05/02/2012   MCV 85.0 05/02/2012   PLT 278 05/02/2012      Chemistry      Component Value Date/Time   NA 136 04/11/2012 1141   NA 139 02/22/2012 1148   K 3.8 04/11/2012 1141   K 4.0 02/22/2012 1148   CL 102 04/11/2012 1141   CL 106 02/22/2012 1148   CO2 23 04/11/2012 1141   CO2 27 02/22/2012 1148   BUN 13.0 04/11/2012 1141   BUN 15 02/22/2012 1148   CREATININE 0.7 04/11/2012 1141   CREATININE 0.52 02/22/2012 1148      Component Value Date/Time   CALCIUM 9.4 04/11/2012 1141   CALCIUM 8.6 02/22/2012 1148   ALKPHOS 80  04/11/2012 1141   ALKPHOS  59 02/22/2012 1148   AST 23 04/11/2012 1141   AST 21 02/22/2012 1148   ALT 38 04/11/2012 1141   ALT 30 02/22/2012 1148   BILITOT 0.50 04/11/2012 1141   BILITOT 0.2* 02/22/2012 1148    ADDITIONAL INFORMATION: 2. PROGNOSTIC INDICATORS - ACIS Results IMMUNOHISTOCHEMICAL AND MORPHOMETRIC ANALYSIS BY THE AUTOMATED CELLULAR IMAGING SYSTEM (ACIS) THE SMALLER TUMOR SHOWS THE FOLLOWING BREAST PROGNOSTIC PROFILE: Estrogen Receptor (Negative, <1%): 100%,POSITIVE, STRONG STAINING INTENSITY Progesterone Receptor (Negative, <1%): 7%,POSITIVE, STRONG STAINING INTENSITY Proliferation Marker Ki67 by M IB-1 (Low<20%): 50% All controls stained appropriately Abigail Miyamoto MD Pathologist, Electronic Signature ( Signed 12/25/2011) 2. CHROMOGENIC IN-SITU HYBRIDIZATION Interpretation: 0.7 CM SMALLER TUMOR: HER2/NEU BY CISH - SHOWS AMPLIFICATION BY CISH ANALYSIS. THE RATIO OF HER2: CEP 17 SIGNALS WAS 4.94. 1.7 CM LEFT SUPERIOR TUMOR: HER-2/NEU BY CISH - NO AMPLIFICATION OF HER-2 DETECTED. THE RATIO OF HER-2: CEP 17 SIGNALS WAS 1.14. Reference range: Ratio: HER2:CEP17 < 1.8 - gene amplification not observed Ratio: HER2:CEP 17 1.8-2.2 - equivocal result Ratio: HER2:CEP17 > 2.2 - gene amplification observed 1 of 4 FINAL for Brandy [name], Brandy [name]) ADDITIONAL INFORMATION:(continued) Comment: The cancer center was notified on 12-24-2011. Abigail Miyamoto MD Pathologist, Electronic Signature ( Signed 12/24/2011) FINAL DIAGNOSIS Diagnosis 1. Lymph node, sentinel, biopsy, Left - METASTATIC CARCINOMA IN 1 OF 1 LYMPH NODE WITH FOCAL CAPSULAR EXTENSION (1/1). 2. Breast, simple mastectomy, Left - INVASIVE DUCTAL CARCINOMA, TWO FOCI, WELL DIFFERENTIATED, SPANNING 1.7 AND 0.7 CM. - DUCTAL CARCINOMA IN SITU. - LYMPHOVASCULAR INVASION IS IDENTIFIED. - THE SURGICAL RESECTION MARGINS ARE NEGATIVE FOR CARCINOMA. - SEE ONCOLOGY TABLE BELOW. 3. Breast, biopsy, left, additional tissue -  BENIGN SKIN AND SUBCUTANEOUS TISSUE. - THERE IS NO EVIDENCE OF MALIGNANCY. Microscopic Comment 2. BREAST, INVASIVE TUMOR, WITH LYMPH NODE SAMPLING Specimen, including laterality: Left breast. Procedure: Simple mastectomy Grade: Both are I Tubule formation: 1 and 1 Nuclear pleomorphism: 1 and 2 Mitotic:1 and 1 Tumor size (gross measurement): 1.7 and 0.7 cm Margins: Negative for carcinoma Invasive, distance to closest margin: 2.0 cm to the deep margin (gross measurement) In-situ, distance to closest margin: 2.0 cm from the deep margin (gross measurement). Lymphovascular invasion: Present. Ductal carcinoma in situ: Present. Grade: Intermediate grade. Extensive intraductal component: No. Lobular neoplasia: Not identified. Tumor focality: Two foci. Treatment effect: N/A Extent of tumor: Confined to breast parenchyma Lymph nodes: # examined: 1 Lymph nodes with metastasis: 1 Isolated tumor cells (< 0.2 mm): 0 Micrometastasis: (> 0.2 mm and < 2.0 mm): 0 Macrometastasis: (> 2.0 mm): 1 Extracapsular extension: Present, focal. 2 of 4 FINAL for Brandy [name], Brandy [name]) Microscopic Comment(continued) Breast prognostic profile: [phone] Estrogen receptor: Positive (100%, strong staining intensity). Progesterone receptor: Positive (100%, strong staining intensity. Her 2 neu: No amplification was detected. The ratio was 1.45. Her 2 neu by CISH will be repeated on both tumors and the results reported separately. Ki-67: 13% Non-neoplastic breast: Healing biopsy site. TNM: mpT1c, pN1a Comments: Grossly, there are two foci of grade I invasive ductal carcinoma present in the simple mastectomy. The larger nodule spans 1.7 cm and is histologically identical to the previous core biopsy, [phone] ("left superior"). The second nodule, 0.7 cm, is grade I invasive ductal carcinoma with extracellular mucin. A complete profile will be performed on this second, smaller nodule. (JBK:gt,  12/22/11) Pecola Leisure MD Pathologist, Electronic Signature   RADIOGRAPHIC STUDIES:  No results found.  ASSESSMENT: 78 year old female with:  #1 new diagnosis of stage II invasive ductal carcinoma of the left  breast she is status post mastectomy with finding of 2 foci of invasive ductal carcinoma both are ER/PR positive but the smaller focus measuring 0.7 cm is HER-2/neu positive with a ratio over 4.  #2 patient was begun on adjuvant chemotherapy and Herceptin. With chemotherapy consisting of Taxol given weekly with weekly Herceptin. She received her first cycle on 02/08/2012.  We are starting every 21 day herceptin today due to side effects of Taxol.  #3 Anemia due to chemotherapy  PLAN: 1. Proceed with herceptin only today.  We discussed her future treatment times and dates.  I ordered her echocardiogram, she will follow with Dr. Gala Romney in the beginning of December.    2. Return in 3 week for follow up for q21d Herceptin.  I spent >15 minutes counseling the patient face to face. The total time spent in the appointment was 30 minutes.  This case was reviewed with Dr. Welton Flakes.  Cherie Ouch Lyn Hollingshead, NP Medical Oncology [facility] Phone: [phone]  05/02/2012, 2:41 PM

## 2012-05-03 ENCOUNTER — Ambulatory Visit
Admission: RE | Admit: 2012-05-03 | Discharge: 2012-05-03 | Disposition: A | Discharge status: Home / Self Care | Payer: Medicare Other | Source: Ambulatory Visit | Attending: Radiation Oncology | Admitting: Radiation Oncology

## 2012-05-03 ENCOUNTER — Telehealth: Payer: Self-pay | Admitting: *Deleted

## 2012-05-03 ENCOUNTER — Ambulatory Visit: Payer: Medicare Other

## 2012-05-03 NOTE — Telephone Encounter (Signed)
Message copied by Cooper Render on Tue May 03, 2012  9:19 AM ------      Message from: Laural Golden      Created: Tue May 03, 2012  8:47 AM       Please call patient and tell them its okay to continue taking potassium.            ----- Message -----         From: [facility] Interface         Sent: 05/02/2012   1:04 PM           To: Augustin Schooling, NP

## 2012-05-03 NOTE — Telephone Encounter (Signed)
Per NP, notified pt to continue taking potassium.

## 2012-05-04 ENCOUNTER — Ambulatory Visit: Payer: Medicare Other

## 2012-05-04 ENCOUNTER — Ambulatory Visit
Admission: RE | Admit: 2012-05-04 | Discharge: 2012-05-04 | Disposition: A | Discharge status: Home / Self Care | Payer: Medicare Other | Source: Ambulatory Visit | Attending: Radiation Oncology | Admitting: Radiation Oncology

## 2012-05-05 ENCOUNTER — Ambulatory Visit: Payer: Medicare Other

## 2012-05-05 ENCOUNTER — Ambulatory Visit
Admission: RE | Admit: 2012-05-05 | Discharge: 2012-05-05 | Disposition: A | Discharge status: Home / Self Care | Payer: Medicare Other | Source: Ambulatory Visit | Attending: Radiation Oncology | Admitting: Radiation Oncology

## 2012-05-06 ENCOUNTER — Ambulatory Visit
Admission: RE | Admit: 2012-05-06 | Discharge: 2012-05-06 | Disposition: A | Discharge status: Home / Self Care | Payer: Medicare Other | Source: Ambulatory Visit | Attending: Radiation Oncology | Admitting: Radiation Oncology

## 2012-05-06 ENCOUNTER — Ambulatory Visit: Payer: Medicare Other

## 2012-05-06 ENCOUNTER — Encounter: Payer: Self-pay | Admitting: Radiation Oncology

## 2012-05-06 VITALS — BP 141/49 | HR 69 | Temp 97.8°F | Resp 20 | Wt 171.1 lb

## 2012-05-06 DIAGNOSIS — C50419 Malignant neoplasm of upper-outer quadrant of unspecified female breast: Secondary | ICD-10-CM

## 2012-05-06 NOTE — Progress Notes (Signed)
Patient here weekly radiation txs left chest wall, alert,oriented x3,  Using radiaplex gel  Daily, no skin changes noted so far, occasional twinge in left chest wall , no real pain stated patient, eating and drinking well, just tired 2:47 PM

## 2012-05-06 NOTE — Progress Notes (Signed)
Department of Radiation Oncology  Phone:  [phone] Fax:        [phone]  Weekly Treatment Note    Name: [name] Date: 05/06/2012 MRN: [id number] DOB: 13-Feb-1933   Current dose: 16.2 Gy  Current fraction: 9   MEDICATIONS: Current Outpatient Prescriptions  Medication Sig Dispense Refill  . amLODipine (NORVASC) 10 MG tablet Take 10 mg by mouth daily.       . citalopram (CELEXA) 20 MG tablet Take 20 mg by mouth daily.      Marland Kitchen dexamethasone (DECADRON) 4 MG tablet Take 2 tablets by mouth daily starting the day after chemotherapy for 2 days. Take with food.  30 tablet  1  . DIOVAN HCT 160-12.5 MG per tablet Take 2 tablets by mouth every morning.       . hyaluronate sodium (RADIAPLEXRX) GEL Apply topically 2 (two) times daily.      Marland Kitchen HYDROcodone-acetaminophen (NORCO) 5-325 MG per tablet       . lidocaine-prilocaine (EMLA) cream Apply topically as needed.  30 g  4  . LORazepam (ATIVAN) 0.5 MG tablet Take 1 tablet (0.5 mg total) by mouth every 6 (six) hours as needed (Nausea or vomiting).  30 tablet  0  . non-metallic deodorant (ALRA) MISC Apply 1 application topically daily as needed.      Marland Kitchen omeprazole (PRILOSEC) 20 MG capsule Take 20 mg by mouth daily.      . ondansetron (ZOFRAN) 8 MG tablet Take 1 tablet two times a day starting the day after chemo for 2 days. Then take 1 tablet two times a day as needed for nausea or vomiting.  30 tablet  1  . oxyCODONE (OXY IR/ROXICODONE) 5 MG immediate release tablet Take 1 tablet (5 mg total) by mouth every 6 (six) hours as needed for pain.  60 tablet  0  . oxyCODONE (OXY IR/ROXICODONE) 5 MG immediate release tablet Take 1 tablet (5 mg total) by mouth every 4 (four) hours as needed for pain.  90 tablet  0  . potassium chloride SA (K-DUR,KLOR-CON) 20 MEQ tablet Take 1 tablet (20 mEq total) by mouth 2 (two) times daily.  20 tablet  0  . prochlorperazine (COMPAZINE) 10 MG tablet Take 1 tablet (10 mg total) by mouth every 6 (six) hours as  needed (Nausea or vomiting).  30 tablet  1  . prochlorperazine (COMPAZINE) 25 MG suppository Place 1 suppository (25 mg total) rectally every 12 (twelve) hours as needed for nausea.  12 suppository  3  . simvastatin (ZOCOR) 20 MG tablet Take 20 mg by mouth daily.       Marland Kitchen zolpidem (AMBIEN) 10 MG tablet Take 10 mg by mouth at bedtime.       No current facility-administered medications for this encounter.   Facility-Administered Medications Ordered in Other Encounters  Medication Dose Route Frequency Provider Last Rate Last Dose  . sodium chloride 0.9 % injection 10 mL  10 mL Intracatheter PRN Victorino December, MD   10 mL at 03/14/12 1628     ALLERGIES: Review of patient's allergies indicates no known allergies.   LABORATORY DATA:  Lab Results  Component Value Date   WBC 9.3 05/02/2012   HGB 11.4* 05/02/2012   HCT 35.1 05/02/2012   MCV 85.0 05/02/2012   PLT 278 05/02/2012   Lab Results  Component Value Date   NA 138 05/02/2012   K 3.8 05/02/2012   CL 104 05/02/2012   CO2 23 05/02/2012   Lab  Results  Component Value Date   ALT 28 05/02/2012   AST 21 05/02/2012   ALKPHOS 73 05/02/2012   BILITOT 0.32 05/02/2012     NARRATIVE: Brandy [name] was seen today for weekly treatment management. The chart was checked and the patient's films were reviewed. The patient is doing very well at this time. She denies any significant complaints. She has not noticed any changes in her skin. She notices occasional fleeting pain in the chest wall area.  PHYSICAL EXAMINATION: weight is 171 lb 1.6 oz (77.61 kg). Her oral temperature is 97.8 F (36.6 C). Her blood pressure is 141/49 and her pulse is 69. Her respiration is 20.      no significant skin change currently in the treatment area  ASSESSMENT: The patient is doing satisfactorily with treatment.  PLAN: We will continue with the patient's radiation treatment as planned.

## 2012-05-09 ENCOUNTER — Ambulatory Visit
Admission: RE | Admit: 2012-05-09 | Discharge: 2012-05-09 | Disposition: A | Discharge status: Home / Self Care | Payer: Medicare Other | Source: Ambulatory Visit | Attending: Radiation Oncology | Admitting: Radiation Oncology

## 2012-05-09 ENCOUNTER — Ambulatory Visit: Payer: Medicare Other

## 2012-05-10 ENCOUNTER — Ambulatory Visit: Payer: Medicare Other

## 2012-05-10 ENCOUNTER — Ambulatory Visit
Admission: RE | Admit: 2012-05-10 | Discharge: 2012-05-10 | Disposition: A | Discharge status: Home / Self Care | Payer: Medicare Other | Source: Ambulatory Visit | Attending: Radiation Oncology | Admitting: Radiation Oncology

## 2012-05-11 ENCOUNTER — Ambulatory Visit: Payer: Medicare Other

## 2012-05-11 ENCOUNTER — Ambulatory Visit
Admission: RE | Admit: 2012-05-11 | Discharge: 2012-05-11 | Disposition: A | Discharge status: Home / Self Care | Payer: Medicare Other | Source: Ambulatory Visit | Attending: Radiation Oncology | Admitting: Radiation Oncology

## 2012-05-12 ENCOUNTER — Ambulatory Visit: Payer: Medicare Other

## 2012-05-12 ENCOUNTER — Ambulatory Visit
Admission: RE | Admit: 2012-05-12 | Discharge: 2012-05-12 | Disposition: A | Discharge status: Home / Self Care | Payer: Medicare Other | Source: Ambulatory Visit | Attending: Radiation Oncology | Admitting: Radiation Oncology

## 2012-05-13 ENCOUNTER — Encounter: Payer: Self-pay | Admitting: Radiation Oncology

## 2012-05-13 ENCOUNTER — Ambulatory Visit
Admission: RE | Admit: 2012-05-13 | Discharge: 2012-05-13 | Disposition: A | Discharge status: Home / Self Care | Payer: Medicare Other | Source: Ambulatory Visit | Attending: Radiation Oncology | Admitting: Radiation Oncology

## 2012-05-13 ENCOUNTER — Ambulatory Visit: Payer: Medicare Other

## 2012-05-13 VITALS — BP 125/60 | HR 69 | Temp 97.8°F | Resp 20 | Wt 168.9 lb

## 2012-05-13 DIAGNOSIS — C50419 Malignant neoplasm of upper-outer quadrant of unspecified female breast: Secondary | ICD-10-CM

## 2012-05-13 NOTE — Progress Notes (Signed)
Department of Radiation Oncology  Phone:  [phone] Fax:        [phone]  Weekly Treatment Note    Name: Brandy [name] Date: 05/13/2012 MRN: [id number] DOB: July 19, 1932   Current dose: 25.2 Gy  Current fraction: 14   MEDICATIONS: Current Outpatient Prescriptions  Medication Sig Dispense Refill  . amLODipine (NORVASC) 10 MG tablet Take 10 mg by mouth daily.       . citalopram (CELEXA) 20 MG tablet Take 20 mg by mouth daily.      Marland Kitchen dexamethasone (DECADRON) 4 MG tablet Take 2 tablets by mouth daily starting the day after chemotherapy for 2 days. Take with food.  30 tablet  1  . DIOVAN HCT 160-12.5 MG per tablet Take 2 tablets by mouth every morning.       . hyaluronate sodium (RADIAPLEXRX) GEL Apply topically 2 (two) times daily.      Marland Kitchen HYDROcodone-acetaminophen (NORCO) 5-325 MG per tablet       . lidocaine-prilocaine (EMLA) cream Apply topically as needed.  30 g  4  . LORazepam (ATIVAN) 0.5 MG tablet Take 1 tablet (0.5 mg total) by mouth every 6 (six) hours as needed (Nausea or vomiting).  30 tablet  0  . non-metallic deodorant (ALRA) MISC Apply 1 application topically daily as needed.      Marland Kitchen omeprazole (PRILOSEC) 20 MG capsule Take 20 mg by mouth daily.      . ondansetron (ZOFRAN) 8 MG tablet Take 1 tablet two times a day starting the day after chemo for 2 days. Then take 1 tablet two times a day as needed for nausea or vomiting.  30 tablet  1  . oxyCODONE (OXY IR/ROXICODONE) 5 MG immediate release tablet Take 1 tablet (5 mg total) by mouth every 6 (six) hours as needed for pain.  60 tablet  0  . oxyCODONE (OXY IR/ROXICODONE) 5 MG immediate release tablet Take 1 tablet (5 mg total) by mouth every 4 (four) hours as needed for pain.  90 tablet  0  . potassium chloride SA (K-DUR,KLOR-CON) 20 MEQ tablet Take 1 tablet (20 mEq total) by mouth 2 (two) times daily.  20 tablet  0  . prochlorperazine (COMPAZINE) 10 MG tablet Take 1 tablet (10 mg total) by mouth every 6 (six) hours as  needed (Nausea or vomiting).  30 tablet  1  . prochlorperazine (COMPAZINE) 25 MG suppository Place 1 suppository (25 mg total) rectally every 12 (twelve) hours as needed for nausea.  12 suppository  3  . simvastatin (ZOCOR) 20 MG tablet Take 20 mg by mouth daily.       Marland Kitchen zolpidem (AMBIEN) 10 MG tablet Take 10 mg by mouth at bedtime.       No current facility-administered medications for this encounter.   Facility-Administered Medications Ordered in Other Encounters  Medication Dose Route Frequency Provider Last Rate Last Dose  . sodium chloride 0.9 % injection 10 mL  10 mL Intracatheter PRN Victorino December, MD   10 mL at 03/14/12 1628     ALLERGIES: Review of patient's allergies indicates no known allergies.   LABORATORY DATA:  Lab Results  Component Value Date   WBC 9.3 05/02/2012   HGB 11.4* 05/02/2012   HCT 35.1 05/02/2012   MCV 85.0 05/02/2012   PLT 278 05/02/2012   Lab Results  Component Value Date   NA 138 05/02/2012   K 3.8 05/02/2012   CL 104 05/02/2012   CO2 23 05/02/2012   Lab  Results  Component Value Date   ALT 28 05/02/2012   AST 21 05/02/2012   ALKPHOS 73 05/02/2012   BILITOT 0.32 05/02/2012     NARRATIVE: Brandy [name] was seen today for weekly treatment management. The chart was checked and the patient's films were reviewed. The patient is doing well. No real complaints this week. She is notice just a little bit of redness.  PHYSICAL EXAMINATION: weight is 168 lb 14.4 oz (76.613 kg). Her oral temperature is 97.8 F (36.6 C). Her blood pressure is 125/60 and her pulse is 69. Her respiration is 20.      her skin looks quite good, minimal erythema.  ASSESSMENT: The patient is doing satisfactorily with treatment.  PLAN: We will continue with the patient's radiation treatment as planned.

## 2012-05-13 NOTE — Progress Notes (Signed)
Patient here weekly rad txs, 14/33 completed so far, left chest wall area, very slight erythema, skin intact, occasional pain in that area stated, eating drinking good, using radiaplex gel once daily  No other c/o 2:57 PM

## 2012-05-16 ENCOUNTER — Ambulatory Visit
Admission: RE | Admit: 2012-05-16 | Discharge: 2012-05-16 | Disposition: A | Discharge status: Home / Self Care | Payer: Medicare Other | Source: Ambulatory Visit | Attending: Radiation Oncology | Admitting: Radiation Oncology

## 2012-05-16 ENCOUNTER — Ambulatory Visit: Payer: Medicare Other

## 2012-05-17 ENCOUNTER — Ambulatory Visit
Admission: RE | Admit: 2012-05-17 | Discharge: 2012-05-17 | Disposition: A | Discharge status: Home / Self Care | Payer: Medicare Other | Source: Ambulatory Visit | Attending: Radiation Oncology | Admitting: Radiation Oncology

## 2012-05-17 ENCOUNTER — Ambulatory Visit: Payer: Medicare Other

## 2012-05-18 ENCOUNTER — Ambulatory Visit: Payer: Medicare Other

## 2012-05-18 ENCOUNTER — Ambulatory Visit
Admission: RE | Admit: 2012-05-18 | Discharge: 2012-05-18 | Disposition: A | Discharge status: Home / Self Care | Payer: Medicare Other | Source: Ambulatory Visit | Attending: Radiation Oncology | Admitting: Radiation Oncology

## 2012-05-19 ENCOUNTER — Ambulatory Visit: Payer: Medicare Other

## 2012-05-19 ENCOUNTER — Ambulatory Visit
Admission: RE | Admit: 2012-05-19 | Discharge: 2012-05-19 | Disposition: A | Discharge status: Home / Self Care | Payer: Medicare Other | Source: Ambulatory Visit | Attending: Radiation Oncology | Admitting: Radiation Oncology

## 2012-05-20 ENCOUNTER — Ambulatory Visit
Admission: RE | Admit: 2012-05-20 | Discharge: 2012-05-20 | Disposition: A | Discharge status: Home / Self Care | Payer: Medicare Other | Source: Ambulatory Visit | Attending: Radiation Oncology | Admitting: Radiation Oncology

## 2012-05-20 ENCOUNTER — Ambulatory Visit: Payer: Medicare Other

## 2012-05-20 VITALS — BP 136/57 | HR 66 | Temp 98.0°F | Wt 163.9 lb

## 2012-05-20 DIAGNOSIS — C50419 Malignant neoplasm of upper-outer quadrant of unspecified female breast: Secondary | ICD-10-CM

## 2012-05-20 NOTE — Progress Notes (Signed)
Patient here for weekly doctor assessment of left chest wall radiation.Completed 19 treatments.Has follicular rash mid chest and redness and burning of left axillary fold.Generalized fatigue.

## 2012-05-20 NOTE — Progress Notes (Signed)
Department of Radiation Oncology  Phone:  [phone] Fax:        [phone]  Weekly Treatment Note    Name: Brandy [name] Date: 05/20/2012 MRN: [id number] DOB: [birth year]   Current dose: 34.2 Gy  Current fraction: 19   MEDICATIONS: Current Outpatient Prescriptions  Medication Sig Dispense Refill  . amLODipine (NORVASC) 10 MG tablet Take 10 mg by mouth daily.       . citalopram (CELEXA) 20 MG tablet Take 20 mg by mouth daily.      Marland Kitchen dexamethasone (DECADRON) 4 MG tablet Take 2 tablets by mouth daily starting the day after chemotherapy for 2 days. Take with food.  30 tablet  1  . DIOVAN HCT 160-12.5 MG per tablet Take 2 tablets by mouth every morning.       . hyaluronate sodium (RADIAPLEXRX) GEL Apply topically 2 (two) times daily.      Marland Kitchen HYDROcodone-acetaminophen (NORCO) 5-325 MG per tablet       . lidocaine-prilocaine (EMLA) cream Apply topically as needed.  30 g  4  . LORazepam (ATIVAN) 0.5 MG tablet Take 1 tablet (0.5 mg total) by mouth every 6 (six) hours as needed (Nausea or vomiting).  30 tablet  0  . non-metallic deodorant (ALRA) MISC Apply 1 application topically daily as needed.      Marland Kitchen omeprazole (PRILOSEC) 20 MG capsule Take 20 mg by mouth daily.      . ondansetron (ZOFRAN) 8 MG tablet Take 1 tablet two times a day starting the day after chemo for 2 days. Then take 1 tablet two times a day as needed for nausea or vomiting.  30 tablet  1  . oxyCODONE (OXY IR/ROXICODONE) 5 MG immediate release tablet Take 1 tablet (5 mg total) by mouth every 6 (six) hours as needed for pain.  60 tablet  0  . oxyCODONE (OXY IR/ROXICODONE) 5 MG immediate release tablet Take 1 tablet (5 mg total) by mouth every 4 (four) hours as needed for pain.  90 tablet  0  . potassium chloride SA (K-DUR,KLOR-CON) 20 MEQ tablet Take 1 tablet (20 mEq total) by mouth 2 (two) times daily.  20 tablet  0  . prochlorperazine (COMPAZINE) 10 MG tablet Take 1 tablet (10 mg total) by mouth every 6 (six) hours  as needed (Nausea or vomiting).  30 tablet  1  . prochlorperazine (COMPAZINE) 25 MG suppository Place 1 suppository (25 mg total) rectally every 12 (twelve) hours as needed for nausea.  12 suppository  3  . simvastatin (ZOCOR) 20 MG tablet Take 20 mg by mouth daily.       Marland Kitchen zolpidem (AMBIEN) 10 MG tablet Take 10 mg by mouth at bedtime.       No current facility-administered medications for this encounter.   Facility-Administered Medications Ordered in Other Encounters  Medication Dose Route Frequency Provider Last Rate Last Dose  . sodium chloride 0.9 % injection 10 mL  10 mL Intracatheter PRN Victorino December, MD   10 mL at 03/14/12 1628     ALLERGIES: Review of patient's allergies indicates no known allergies.   LABORATORY DATA:  Lab Results  Component Value Date   WBC 9.3 05/02/2012   HGB 11.4* 05/02/2012   HCT 35.1 05/02/2012   MCV 85.0 05/02/2012   PLT 278 05/02/2012   Lab Results  Component Value Date   NA 138 05/02/2012   K 3.8 05/02/2012   CL 104 05/02/2012   CO2 23 05/02/2012   Lab  Results  Component Value Date   ALT 28 05/02/2012   AST 21 05/02/2012   ALKPHOS 73 05/02/2012   BILITOT 0.32 05/02/2012     NARRATIVE: Brandy [name] was seen today for weekly treatment management. The chart was checked and the patient's films were reviewed. The patient is doing well. A little bit of redness and burning in the chest area.  PHYSICAL EXAMINATION: weight is 163 lb 14.4 oz (74.345 kg). Her temperature is 98 F (36.7 C). Her blood pressure is 136/57 and her pulse is 66.      skin looks quite good. Greater irritation in the lower axilla. No desquamation.  ASSESSMENT: The patient is doing satisfactorily with treatment.  PLAN: We will continue with the patient's radiation treatment as planned. She will continue her current skin care.

## 2012-05-23 ENCOUNTER — Ambulatory Visit (HOSPITAL_BASED_OUTPATIENT_CLINIC_OR_DEPARTMENT_OTHER): Payer: Medicare Other

## 2012-05-23 ENCOUNTER — Other Ambulatory Visit (HOSPITAL_BASED_OUTPATIENT_CLINIC_OR_DEPARTMENT_OTHER): Payer: Medicare Other | Admitting: Lab

## 2012-05-23 ENCOUNTER — Ambulatory Visit: Payer: Medicare Other

## 2012-05-23 ENCOUNTER — Ambulatory Visit
Admission: RE | Admit: 2012-05-23 | Discharge: 2012-05-23 | Disposition: A | Discharge status: Home / Self Care | Payer: Medicare Other | Source: Ambulatory Visit | Attending: Radiation Oncology | Admitting: Radiation Oncology

## 2012-05-23 ENCOUNTER — Ambulatory Visit (HOSPITAL_BASED_OUTPATIENT_CLINIC_OR_DEPARTMENT_OTHER): Payer: Medicare Other | Admitting: Adult Health

## 2012-05-23 ENCOUNTER — Encounter: Payer: Self-pay | Admitting: Adult Health

## 2012-05-23 VITALS — BP 163/71 | HR 80 | Temp 97.6°F | Resp 20 | Ht 59.0 in | Wt 164.0 lb

## 2012-05-23 DIAGNOSIS — C50419 Malignant neoplasm of upper-outer quadrant of unspecified female breast: Secondary | ICD-10-CM

## 2012-05-23 DIAGNOSIS — C50919 Malignant neoplasm of unspecified site of unspecified female breast: Secondary | ICD-10-CM

## 2012-05-23 DIAGNOSIS — R52 Pain, unspecified: Secondary | ICD-10-CM

## 2012-05-23 DIAGNOSIS — Z79899 Other long term (current) drug therapy: Secondary | ICD-10-CM

## 2012-05-23 DIAGNOSIS — Z23 Encounter for immunization: Secondary | ICD-10-CM

## 2012-05-23 DIAGNOSIS — C773 Secondary and unspecified malignant neoplasm of axilla and upper limb lymph nodes: Secondary | ICD-10-CM

## 2012-05-23 DIAGNOSIS — Z5112 Encounter for antineoplastic immunotherapy: Secondary | ICD-10-CM

## 2012-05-23 DIAGNOSIS — D6481 Anemia due to antineoplastic chemotherapy: Secondary | ICD-10-CM

## 2012-05-23 DIAGNOSIS — T451X5A Adverse effect of antineoplastic and immunosuppressive drugs, initial encounter: Secondary | ICD-10-CM

## 2012-05-23 LAB — COMPREHENSIVE METABOLIC PANEL (CC13)
ALT: 27 U/L (ref 0–55)
Albumin: 3.4 g/dL — ABNORMAL LOW (ref 3.5–5.0)
Alkaline Phosphatase: 74 U/L (ref 40–150)
CO2: 24 mEq/L (ref 22–29)
Glucose: 109 mg/dl — ABNORMAL HIGH (ref 70–99)
Potassium: 3.9 mEq/L (ref 3.5–5.1)
Sodium: 138 mEq/L (ref 136–145)
Total Bilirubin: 0.47 mg/dL (ref 0.20–1.20)
Total Protein: 6.4 g/dL (ref 6.4–8.3)

## 2012-05-23 LAB — CBC WITH DIFFERENTIAL/PLATELET
Basophils Absolute: 0 10*3/uL (ref 0.0–0.1)
EOS%: 2.6 % (ref 0.0–7.0)
Eosinophils Absolute: 0.2 10*3/uL (ref 0.0–0.5)
HCT: 34.1 % — ABNORMAL LOW (ref 34.8–46.6)
HGB: 11 g/dL — ABNORMAL LOW (ref 11.6–15.9)
LYMPH%: 19.1 % (ref 14.0–49.7)
MCH: 27.6 pg (ref 25.1–34.0)
MCV: 85.7 fL (ref 79.5–101.0)
MONO%: 9.6 % (ref 0.0–14.0)
NEUT%: 68.2 % (ref 38.4–76.8)
Platelets: 234 10*3/uL (ref 145–400)

## 2012-05-23 MED ORDER — HEPARIN SOD (PORK) LOCK FLUSH 100 UNIT/ML IV SOLN
500.0000 [IU] | Freq: Once | INTRAVENOUS | Status: AC | PRN
  Administered 2012-05-23: 500 [IU]
  Filled 2012-05-23: qty 5

## 2012-05-23 MED ORDER — TRASTUZUMAB CHEMO INJECTION 440 MG
6.0000 mg/kg | Freq: Once | INTRAVENOUS | Status: AC
  Administered 2012-05-23: 462 mg via INTRAVENOUS
  Filled 2012-05-23: qty 22

## 2012-05-23 MED ORDER — INFLUENZA VIRUS VACC SPLIT PF IM SUSP
0.5000 mL | Freq: Once | INTRAMUSCULAR | Status: AC
  Administered 2012-05-23: 0.5 mL via INTRAMUSCULAR
  Filled 2012-05-23: qty 0.5

## 2012-05-23 MED ORDER — SODIUM CHLORIDE 0.9 % IJ SOLN
10.0000 mL | INTRAMUSCULAR | Status: DC | PRN
  Administered 2012-05-23: 10 mL
  Filled 2012-05-23: qty 10

## 2012-05-23 MED ORDER — ACETAMINOPHEN 325 MG PO TABS
650.0000 mg | ORAL_TABLET | Freq: Once | ORAL | Status: AC
  Administered 2012-05-23: 650 mg via ORAL

## 2012-05-23 MED ORDER — DIPHENHYDRAMINE HCL 25 MG PO CAPS
50.0000 mg | ORAL_CAPSULE | Freq: Once | ORAL | Status: AC
  Administered 2012-05-23: 50 mg via ORAL

## 2012-05-23 MED ORDER — SODIUM CHLORIDE 0.9 % IV SOLN
Freq: Once | INTRAVENOUS | Status: AC
  Administered 2012-05-23: 13:00:00 via INTRAVENOUS

## 2012-05-23 MED ORDER — OXYCODONE HCL 5 MG PO TABS: 5.0000 mg | ORAL_TABLET | ORAL | Status: DC | PRN

## 2012-05-23 NOTE — Patient Instructions (Signed)
[facility] Discharge Instructions for Patients Receiving Chemotherapy  Today you received the following chemotherapy agents Herceptin.   If you develop nausea and vomiting that is not controlled by your nausea medication, call the clinic. If it is after clinic hours your family physician or the after hours number for the clinic or go to the Emergency Department.   BELOW ARE SYMPTOMS THAT SHOULD BE REPORTED IMMEDIATELY:  *FEVER GREATER THAN 100.5 F  *CHILLS WITH OR WITHOUT FEVER  NAUSEA AND VOMITING THAT IS NOT CONTROLLED WITH YOUR NAUSEA MEDICATION  *UNUSUAL SHORTNESS OF BREATH  *UNUSUAL BRUISING OR BLEEDING  TENDERNESS IN MOUTH AND THROAT WITH OR WITHOUT PRESENCE OF ULCERS  *URINARY PROBLEMS  *BOWEL PROBLEMS  UNUSUAL RASH Items with * indicate a potential emergency and should be followed up as soon as possible.  One of the nurses will contact you 24 hours after your treatment. Please let the nurse know about any problems that you may have experienced. Feel free to call the clinic you have any questions or concerns. The clinic phone number is [phone].   I have been informed and understand all the instructions given to me. I know to contact the clinic, my physician, or go to the Emergency Department if any problems should occur. I do not have any questions at this time, but understand that I may call the clinic during office hours   should I have any questions or need assistance in obtaining follow up care.    __________________________________________  _____________  __________ Signature of Patient or Authorized Representative            Date                   Time    __________________________________________ Nurse's Signature   

## 2012-05-23 NOTE — Progress Notes (Signed)
OFFICE PROGRESS NOTE  CC  [name], MD [address] [place] [address] Dr. Emelia Loron Dr. Antony Blackbird  DIAGNOSIS:  78 year old female with new diagnosis of multifocal breast cancer of the left breast. Patient is status post mastectomy with sentinel lymph node biopsy performed on 12/18/2011.   PRIOR THERAPY: 1.Patient has had multiple biopsies performed including endoscopic ultrasound guided biopsies as well as an open biopsy without a definitive diagnosis  #2 patient now with new diagnosis of multifocal breast cancer of the left breast. She underwent a mammogram after she felt a mass in the left breast. The mammogram showed an abnormality. As a biopsy was performed and she was found to have an ace invasive ductal carcinoma. She has now had a mastectomy on 12/18/2011. The final pathology revealed 2 foci of well-differentiated invasive ductal carcinoma one measuring 1.7 cm and the second measuring 0.7 cm there was ductal carcinoma in situ lymphovascular invasion was also identified all surgical margins were negative for carcinoma. Patient went on to also have a left sentinel node biopsy one sentinel node was positive for metastatic disease with focal capsular extension. The tumor was estrogen receptor positive progesterone receptor positive. The largest tumor measuring 1.7 cm was HER-2/neu negative with a Ki-67 of 13%. The smaller tumor showed ER +100% PR receptor 7% proliferation marker 50% and it was HER-2/neu positive with a ratio 4.94.  #3 patient has been seen by radiation oncology and post mastectomy radiation therapy is recommended. At this time she will not get a left axillary lymph node dissection.  #4 patient's tumor is HER-2/neu positive and does she will receive adjuvant chemotherapy and Herceptin based therapy.  #5 patient will proceed with combination chemotherapy consisting of Taxol and Herceptin starting on 02/09/2012. A total of 12 weeks of combination  treatment is planned. Once she completes this she will undergo radiation therapy. After which she will receive Herceptin for one year with 5 years of letrozole 2.5 mg daily.  CURRENT THERAPY: Q 3 week herceptin and radiation therapy.    INTERVAL HISTORY: Brandy [name] 78 y.o. female returns for followup visit today.  She's doing well.  She does have fatigue and erythema on her left chest wall due to her radiation.  She also continues to have bilateral lower extremity neuropathy.  She is seeing Dr. Gala Romney on 06/14/12. Otherwise she is doing well and without questions/concerns.    MEDICAL HISTORY: Past Medical History  Diagnosis Date  . Asthma   . Hyperlipidemia     takes Simvastatin daily  . Hypertension     takes Amlodipine and Diovan daily  . Cancer     left breast  . Bronchitis     hx of;last time >37yr ago  . Arthritis     shoulders  . Dysphagia   . H/O hiatal hernia   . GERD (gastroesophageal reflux disease)     takes Prilosec prn  . Urinary urgency   . History of kidney stones     many yrs ago  . Depression     takes Celexa daily  . Insomnia     takes Ambien nightly  . Breast cancer 12/18/11    left breast masectomy=metastatic ca in (1/1) lymph node ,invasive ductal ca,2 foci,,dcis,lymph ovascular invasion identified,surgical resection margins neg for ca,additional tissue=benign skin and subcutaneous tissue  . Breast CA 11/23/11    left breast 3 o'clock bx=high grade ductal ca in situ w/comedononecrosis and calcification,dcis,lymphovascular invasion present ,ER?PR+positive  . Full dentures  ALLERGIES:   has no known allergies.  MEDICATIONS:  Current Outpatient Prescriptions  Medication Sig Dispense Refill  . amLODipine (NORVASC) 10 MG tablet Take 10 mg by mouth daily.       . citalopram (CELEXA) 20 MG tablet Take 20 mg by mouth daily.      Marland Kitchen dexamethasone (DECADRON) 4 MG tablet Take 2 tablets by mouth daily starting the day after chemotherapy for 2 days. Take  with food.  30 tablet  1  . DIOVAN HCT 160-12.5 MG per tablet Take 2 tablets by mouth every morning.       . hyaluronate sodium (RADIAPLEXRX) GEL Apply topically 2 (two) times daily.      Marland Kitchen HYDROcodone-acetaminophen (NORCO) 5-325 MG per tablet       . lidocaine-prilocaine (EMLA) cream Apply topically as needed.  30 g  4  . LORazepam (ATIVAN) 0.5 MG tablet Take 1 tablet (0.5 mg total) by mouth every 6 (six) hours as needed (Nausea or vomiting).  30 tablet  0  . non-metallic deodorant (ALRA) MISC Apply 1 application topically daily as needed.      Marland Kitchen omeprazole (PRILOSEC) 20 MG capsule Take 20 mg by mouth daily.      . ondansetron (ZOFRAN) 8 MG tablet Take 1 tablet two times a day starting the day after chemo for 2 days. Then take 1 tablet two times a day as needed for nausea or vomiting.  30 tablet  1  . oxyCODONE (OXY IR/ROXICODONE) 5 MG immediate release tablet Take 1 tablet (5 mg total) by mouth every 6 (six) hours as needed for pain.  60 tablet  0  . oxyCODONE (OXY IR/ROXICODONE) 5 MG immediate release tablet Take 1 tablet (5 mg total) by mouth every 4 (four) hours as needed for pain.  90 tablet  0  . potassium chloride SA (K-DUR,KLOR-CON) 20 MEQ tablet Take 1 tablet (20 mEq total) by mouth 2 (two) times daily.  20 tablet  0  . prochlorperazine (COMPAZINE) 10 MG tablet Take 1 tablet (10 mg total) by mouth every 6 (six) hours as needed (Nausea or vomiting).  30 tablet  1  . prochlorperazine (COMPAZINE) 25 MG suppository Place 1 suppository (25 mg total) rectally every 12 (twelve) hours as needed for nausea.  12 suppository  3  . simvastatin (ZOCOR) 20 MG tablet Take 20 mg by mouth daily.       Marland Kitchen zolpidem (AMBIEN) 10 MG tablet Take 10 mg by mouth at bedtime.       No current facility-administered medications for this visit.   Facility-Administered Medications Ordered in Other Visits  Medication Dose Route Frequency Provider Last Rate Last Dose  . sodium chloride 0.9 % injection 10 mL  10 mL  Intracatheter PRN Victorino December, MD   10 mL at 03/14/12 1628    SURGICAL HISTORY:  Past Surgical History  Procedure Date  . Breast biopsy 1998    left  . Total shoulder replacement 2011    left  . Eus 06/04/2011    Procedure: UPPER ENDOSCOPIC ULTRASOUND (EUS) LINEAR;  Surgeon: Rob Bunting, MD;  Location: [facility];  Service: Endoscopy;  Laterality: N/A;  . Exploratory laparotomy      biopsy of intra-abdominal mass  . Bladder tack   . Tubal ligation   . Appendectomy   . Dilation and curettage of uterus   . Esophagogastroduodenoscopy   . Mastectomy w/ sentinel node biopsy 12/18/2011    Procedure: MASTECTOMY WITH SENTINEL LYMPH NODE BIOPSY;  Surgeon: Emelia Loron, MD;  Location: [facility] OR;  Service: General;  Laterality: Left;  . Portacath placement 01/27/2012    Procedure: INSERTION PORT-A-CATH;  Surgeon: Emelia Loron, MD;  Location: [facility];  Service: General;  Laterality: Right;  PORT PLACEMENT    REVIEW OF SYSTEMS:   General: fatigue (+), night sweats (-), fever (-), pain (-) Lymph: palpable nodes (-) HEENT: vision changes (-), mucositis (-), gum bleeding (-), epistaxis (-) Cardiovascular: chest pain (-), palpitations (-) Pulmonary: shortness of breath (-), dyspnea on exertion (+), cough (-), hemoptysis (-) GI:  Early satiety (-), melena (-), dysphagia (-), nausea/vomiting (-), diarrhea (-) GU: dysuria (-), hematuria (-), incontinence (-) Musculoskeletal: joint swelling (-), joint pain (-), back pain (-) Neuro: weakness (-), numbness (-), headache (-), confusion (-) Skin: Rash (-), lesions (-), dryness (-) Psych: depression (-), suicidal/homicidal ideation (-), feeling of hopelessness (-)  Health Maintenance  Mammogram: 11/2011 Colonoscopy: Never Bone Density Scan: Never Pap Smear: Years Eye Exam: 2 years ago Vitamin D Level: never Lipid Panel: 2011   PHYSICAL EXAMINATION:  BP 163/71  Pulse 80  Temp 97.6 F (36.4 C) (Oral)  Resp 20   Ht 4\' 11"  (1.499 m)  Wt 164 lb (74.39 kg)  BMI 33.12 kg/m2 General: Patient is an elderly appearing female in no acute distress HEENT: PERRLA, sclerae anicteric no conjunctival pallor, MMM Neck: supple, no palpable adenopathy Lungs: clear to auscultation bilaterally, no wheezes, rhonchi, or rales Cardiovascular: regular rate rhythm, S1, S2, systolic murmur, rubs or gallops Abdomen: Soft, non-tender, non-distended, normoactive bowel sounds, no HSM Extremities: warm and well perfused, no clubbing, cyanosis, or edema Skin: No lesions.  Erythema on left chest wall extending under axilla and covering mastectomy site.  No breakdown.   Neuro: Non-focal Breast: Left mastectomy no nodularity, or local sign of recurrence.  Right breast is without masses ECOG PERFORMANCE STATUS: 1 - Symptomatic but completely ambulatory  LABORATORY DATA: Lab Results  Component Value Date   WBC 6.6 05/23/2012   HGB 11.0* 05/23/2012   HCT 34.1* 05/23/2012   MCV 85.7 05/23/2012   PLT 234 05/23/2012      Chemistry      Component Value Date/Time   NA 138 05/02/2012 1236   NA 139 02/22/2012 1148   K 3.8 05/02/2012 1236   K 4.0 02/22/2012 1148   CL 104 05/02/2012 1236   CL 106 02/22/2012 1148   CO2 23 05/02/2012 1236   CO2 27 02/22/2012 1148   BUN 19.0 05/02/2012 1236   BUN 15 02/22/2012 1148   CREATININE 0.7 05/02/2012 1236   CREATININE 0.52 02/22/2012 1148      Component Value Date/Time   CALCIUM 9.7 05/02/2012 1236   CALCIUM 8.6 02/22/2012 1148   ALKPHOS 73 05/02/2012 1236   ALKPHOS 59 02/22/2012 1148   AST 21 05/02/2012 1236   AST 21 02/22/2012 1148   ALT 28 05/02/2012 1236   ALT 30 02/22/2012 1148   BILITOT 0.32 05/02/2012 1236   BILITOT 0.2* 02/22/2012 1148    ADDITIONAL INFORMATION: 2. PROGNOSTIC INDICATORS - ACIS Results IMMUNOHISTOCHEMICAL AND MORPHOMETRIC ANALYSIS BY THE AUTOMATED CELLULAR IMAGING SYSTEM (ACIS) THE SMALLER TUMOR SHOWS THE FOLLOWING BREAST PROGNOSTIC PROFILE: Estrogen Receptor  (Negative, <1%): 100%,POSITIVE, STRONG STAINING INTENSITY Progesterone Receptor (Negative, <1%): 7%,POSITIVE, STRONG STAINING INTENSITY Proliferation Marker Ki67 by M IB-1 (Low<20%): 50% All controls stained appropriately Abigail Miyamoto MD Pathologist, Electronic Signature ( Signed 12/25/2011) 2. CHROMOGENIC IN-SITU HYBRIDIZATION Interpretation: 0.7 CM SMALLER TUMOR: HER2/NEU BY CISH -  SHOWS AMPLIFICATION BY CISH ANALYSIS. THE RATIO OF HER2: CEP 17 SIGNALS WAS 4.94. 1.7 CM LEFT SUPERIOR TUMOR: HER-2/NEU BY CISH - NO AMPLIFICATION OF HER-2 DETECTED. THE RATIO OF HER-2: CEP 17 SIGNALS WAS 1.14. Reference range: Ratio: HER2:CEP17 < 1.8 - gene amplification not observed Ratio: HER2:CEP 17 1.8-2.2 - equivocal result Ratio: HER2:CEP17 > 2.2 - gene amplification observed 1 of 4 FINAL for Stroh, Donabelle S [phone]) ADDITIONAL INFORMATION:(continued) Comment: The cancer center was notified on 12-24-2011. Abigail Miyamoto MD Pathologist, Electronic Signature ( Signed 12/24/2011) FINAL DIAGNOSIS Diagnosis 1. Lymph node, sentinel, biopsy, Left - METASTATIC CARCINOMA IN 1 OF 1 LYMPH NODE WITH FOCAL CAPSULAR EXTENSION (1/1). 2. Breast, simple mastectomy, Left - INVASIVE DUCTAL CARCINOMA, TWO FOCI, WELL DIFFERENTIATED, SPANNING 1.7 AND 0.7 CM. - DUCTAL CARCINOMA IN SITU. - LYMPHOVASCULAR INVASION IS IDENTIFIED. - THE SURGICAL RESECTION MARGINS ARE NEGATIVE FOR CARCINOMA. - SEE ONCOLOGY TABLE BELOW. 3. Breast, biopsy, left, additional tissue - BENIGN SKIN AND SUBCUTANEOUS TISSUE. - THERE IS NO EVIDENCE OF MALIGNANCY. Microscopic Comment 2. BREAST, INVASIVE TUMOR, WITH LYMPH NODE SAMPLING Specimen, including laterality: Left breast. Procedure: Simple mastectomy Grade: Both are I Tubule formation: 1 and 1 Nuclear pleomorphism: 1 and 2 Mitotic:1 and 1 Tumor size (gross measurement): 1.7 and 0.7 cm Margins: Negative for carcinoma Invasive, distance to closest margin: 2.0 cm to the  deep margin (gross measurement) In-situ, distance to closest margin: 2.0 cm from the deep margin (gross measurement). Lymphovascular invasion: Present. Ductal carcinoma in situ: Present. Grade: Intermediate grade. Extensive intraductal component: No. Lobular neoplasia: Not identified. Tumor focality: Two foci. Treatment effect: N/A Extent of tumor: Confined to breast parenchyma Lymph nodes: # examined: 1 Lymph nodes with metastasis: 1 Isolated tumor cells (< 0.2 mm): 0 Micrometastasis: (> 0.2 mm and < 2.0 mm): 0 Macrometastasis: (> 2.0 mm): 1 Extracapsular extension: Present, focal. 2 of 4 FINAL for Hankins, Emmarose S [phone]) Microscopic Comment(continued) Breast prognostic profile: [phone] Estrogen receptor: Positive (100%, strong staining intensity). Progesterone receptor: Positive (100%, strong staining intensity. Her 2 neu: No amplification was detected. The ratio was 1.45. Her 2 neu by CISH will be repeated on both tumors and the results reported separately. Ki-67: 13% Non-neoplastic breast: Healing biopsy site. TNM: mpT1c, pN1a Comments: Grossly, there are two foci of grade I invasive ductal carcinoma present in the simple mastectomy. The larger nodule spans 1.7 cm and is histologically identical to the previous core biopsy, [phone] ("left superior"). The second nodule, 0.7 cm, is grade I invasive ductal carcinoma with extracellular mucin. A complete profile will be performed on this second, smaller nodule. (JBK:gt, 12/22/11) Pecola Leisure MD Pathologist, Electronic Signature   RADIOGRAPHIC STUDIES:  No results found.  ASSESSMENT: 78 year old female with:  #1 new diagnosis of stage II invasive ductal carcinoma of the left breast she is status post mastectomy with finding of 2 foci of invasive ductal carcinoma both are ER/PR positive but the smaller focus measuring 0.7 cm is HER-2/neu positive with a ratio over 4.  #2 patient was begun on adjuvant  chemotherapy and Herceptin. With chemotherapy consisting of Taxol given weekly with weekly Herceptin. She received her first cycle on 02/08/2012.  We are starting every 21 day herceptin today due to side effects of Taxol.  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 Proceed with herceptin only today.  I will see her back on 12/2.  I ordered her a flu shot and refilled her Oxycodone for her extremity pain.    2. Return in 3  week for follow up for q21d Herceptin.  She knows to call with any questions or concerns before her next appointment.    I spent >15 minutes counseling the patient face to face. The total time spent in the appointment was 30 minutes.  This case was reviewed with Dr. Welton Flakes.  Cherie Ouch Lyn Hollingshead, NP Medical Oncology [facility] Phone: [phone]  05/23/2012, 12:26 PM

## 2012-05-23 NOTE — Patient Instructions (Addendum)
Doing well.  Proceed with herceptin today.  Please call us if you have any questions or concerns.

## 2012-05-24 ENCOUNTER — Ambulatory Visit
Admission: RE | Admit: 2012-05-24 | Discharge: 2012-05-24 | Disposition: A | Discharge status: Home / Self Care | Payer: Medicare Other | Source: Ambulatory Visit | Attending: Radiation Oncology | Admitting: Radiation Oncology

## 2012-05-24 ENCOUNTER — Ambulatory Visit: Payer: Medicare Other

## 2012-05-25 ENCOUNTER — Ambulatory Visit
Admission: RE | Admit: 2012-05-25 | Discharge: 2012-05-25 | Disposition: A | Discharge status: Home / Self Care | Payer: Medicare Other | Source: Ambulatory Visit | Attending: Radiation Oncology | Admitting: Radiation Oncology

## 2012-05-25 ENCOUNTER — Ambulatory Visit: Payer: Medicare Other

## 2012-05-26 ENCOUNTER — Ambulatory Visit: Payer: Medicare Other

## 2012-05-26 ENCOUNTER — Ambulatory Visit
Admission: RE | Admit: 2012-05-26 | Discharge: 2012-05-26 | Disposition: A | Discharge status: Home / Self Care | Payer: Medicare Other | Source: Ambulatory Visit | Attending: Radiation Oncology | Admitting: Radiation Oncology

## 2012-05-26 ENCOUNTER — Encounter: Payer: Self-pay | Admitting: Radiation Oncology

## 2012-05-27 ENCOUNTER — Encounter: Payer: Self-pay | Admitting: Radiation Oncology

## 2012-05-27 ENCOUNTER — Ambulatory Visit
Admission: RE | Admit: 2012-05-27 | Discharge: 2012-05-27 | Disposition: A | Discharge status: Home / Self Care | Payer: Medicare Other | Source: Ambulatory Visit | Attending: Radiation Oncology | Admitting: Radiation Oncology

## 2012-05-27 ENCOUNTER — Ambulatory Visit: Payer: Medicare Other

## 2012-05-27 VITALS — BP 136/72 | HR 61 | Temp 97.6°F | Resp 20 | Wt 165.2 lb

## 2012-05-27 DIAGNOSIS — C50419 Malignant neoplasm of upper-outer quadrant of unspecified female breast: Secondary | ICD-10-CM

## 2012-05-27 NOTE — Progress Notes (Signed)
Department of Radiation Oncology  Phone:  [phone] Fax:        [phone]  Weekly Treatment Note    Name: Brandy [name] Date: 05/27/2012 MRN: [id number] DOB: June 23, 1933   Current dose: 43.2 Gy  Current fraction: 24   MEDICATIONS: Current Outpatient Prescriptions  Medication Sig Dispense Refill  . amLODipine (NORVASC) 10 MG tablet Take 10 mg by mouth daily.       . citalopram (CELEXA) 20 MG tablet Take 20 mg by mouth daily.      Marland Kitchen dexamethasone (DECADRON) 4 MG tablet Take 2 tablets by mouth daily starting the day after chemotherapy for 2 days. Take with food.  30 tablet  1  . DIOVAN HCT 160-12.5 MG per tablet Take 2 tablets by mouth every morning.       . hyaluronate sodium (RADIAPLEXRX) GEL Apply topically 2 (two) times daily.      Marland Kitchen lidocaine-prilocaine (EMLA) cream Apply topically as needed.  30 g  4  . LORazepam (ATIVAN) 0.5 MG tablet Take 1 tablet (0.5 mg total) by mouth every 6 (six) hours as needed (Nausea or vomiting).  30 tablet  0  . non-metallic deodorant (ALRA) MISC Apply 1 application topically daily as needed.      Marland Kitchen omeprazole (PRILOSEC) 20 MG capsule Take 20 mg by mouth daily.      . ondansetron (ZOFRAN) 8 MG tablet Take 1 tablet two times a day starting the day after chemo for 2 days. Then take 1 tablet two times a day as needed for nausea or vomiting.  30 tablet  1  . oxyCODONE (OXY IR/ROXICODONE) 5 MG immediate release tablet Take 1 tablet (5 mg total) by mouth every 4 (four) hours as needed for pain.  90 tablet  0  . potassium chloride SA (K-DUR,KLOR-CON) 20 MEQ tablet Take 1 tablet (20 mEq total) by mouth 2 (two) times daily.  20 tablet  0  . prochlorperazine (COMPAZINE) 10 MG tablet Take 1 tablet (10 mg total) by mouth every 6 (six) hours as needed (Nausea or vomiting).  30 tablet  1  . prochlorperazine (COMPAZINE) 25 MG suppository Place 1 suppository (25 mg total) rectally every 12 (twelve) hours as needed for nausea.  12 suppository  3  .  simvastatin (ZOCOR) 20 MG tablet Take 20 mg by mouth daily.       Marland Kitchen zolpidem (AMBIEN) 10 MG tablet Take 10 mg by mouth at bedtime.       No current facility-administered medications for this encounter.   Facility-Administered Medications Ordered in Other Encounters  Medication Dose Route Frequency Provider Last Rate Last Dose  . sodium chloride 0.9 % injection 10 mL  10 mL Intracatheter PRN Victorino December, MD   10 mL at 03/14/12 1628     ALLERGIES: Review of patient's allergies indicates no known allergies.   LABORATORY DATA:  Lab Results  Component Value Date   WBC 6.6 05/23/2012   HGB 11.0* 05/23/2012   HCT 34.1* 05/23/2012   MCV 85.7 05/23/2012   PLT 234 05/23/2012   Lab Results  Component Value Date   NA 138 05/23/2012   K 3.9 05/23/2012   CL 107 05/23/2012   CO2 24 05/23/2012   Lab Results  Component Value Date   ALT 27 05/23/2012   AST 23 05/23/2012   ALKPHOS 74 05/23/2012   BILITOT 0.47 05/23/2012     NARRATIVE: Brandy [name] was seen today for weekly treatment management. The chart was checked  and the patient's films were reviewed. The patient is doing fairly well. She has noticed some increased irritation which he feels in the skin of the treatment area. She is using skin cream twice a day.  PHYSICAL EXAMINATION: weight is 165 lb 3.2 oz (74.934 kg). Her oral temperature is 97.6 F (36.4 C). Her blood pressure is 136/72 and her pulse is 61. Her respiration is 20.      the patient's skin is holding up fairly well. Diffuse hyperpigmentation. No desquamation.  ASSESSMENT: The patient is doing satisfactorily with treatment.  PLAN: We will continue with the patient's radiation treatment as planned.

## 2012-05-27 NOTE — Progress Notes (Signed)
Patient here weekly rad ZOX:WRUE chest wall/breast=24/33 completed  Alert,oriented x3, erythema/dermatitis on chest wall and under axilla, skin intact, no c/o pain, just itches a little uses radiaplex gel bid 2:53 PM

## 2012-05-28 ENCOUNTER — Ambulatory Visit
Admission: RE | Admit: 2012-05-28 | Discharge: 2012-05-28 | Disposition: A | Discharge status: Home / Self Care | Payer: Medicare Other | Source: Ambulatory Visit | Attending: Radiation Oncology | Admitting: Radiation Oncology

## 2012-05-30 ENCOUNTER — Ambulatory Visit
Admission: RE | Admit: 2012-05-30 | Discharge: 2012-05-30 | Disposition: A | Discharge status: Home / Self Care | Payer: Medicare Other | Source: Ambulatory Visit | Attending: Radiation Oncology | Admitting: Radiation Oncology

## 2012-05-30 ENCOUNTER — Ambulatory Visit: Payer: Medicare Other

## 2012-05-31 ENCOUNTER — Ambulatory Visit
Admission: RE | Admit: 2012-05-31 | Discharge: 2012-05-31 | Disposition: A | Discharge status: Home / Self Care | Payer: Medicare Other | Source: Ambulatory Visit | Attending: Radiation Oncology | Admitting: Radiation Oncology

## 2012-05-31 ENCOUNTER — Ambulatory Visit: Payer: Medicare Other

## 2012-06-01 ENCOUNTER — Ambulatory Visit
Admission: RE | Admit: 2012-06-01 | Discharge: 2012-06-01 | Disposition: A | Discharge status: Home / Self Care | Payer: Medicare Other | Source: Ambulatory Visit | Attending: Radiation Oncology | Admitting: Radiation Oncology

## 2012-06-01 ENCOUNTER — Ambulatory Visit: Payer: Medicare Other

## 2012-06-01 DIAGNOSIS — C50419 Malignant neoplasm of upper-outer quadrant of unspecified female breast: Secondary | ICD-10-CM

## 2012-06-01 NOTE — Progress Notes (Signed)
Department of Radiation Oncology  Phone:  [phone] Fax:        [phone]  Weekly Treatment Note    Name: Brandy [name] Date: 06/01/2012 MRN: [id number] DOB: 1933/02/11   Current dose: 50.4 Gy  Current fraction: 28   MEDICATIONS: Current Outpatient Prescriptions  Medication Sig Dispense Refill  . amLODipine (NORVASC) 10 MG tablet Take 10 mg by mouth daily.       . citalopram (CELEXA) 20 MG tablet Take 20 mg by mouth daily.      Marland Kitchen dexamethasone (DECADRON) 4 MG tablet Take 2 tablets by mouth daily starting the day after chemotherapy for 2 days. Take with food.  30 tablet  1  . DIOVAN HCT 160-12.5 MG per tablet Take 2 tablets by mouth every morning.       . hyaluronate sodium (RADIAPLEXRX) GEL Apply topically 2 (two) times daily.      Marland Kitchen lidocaine-prilocaine (EMLA) cream Apply topically as needed.  30 g  4  . LORazepam (ATIVAN) 0.5 MG tablet Take 1 tablet (0.5 mg total) by mouth every 6 (six) hours as needed (Nausea or vomiting).  30 tablet  0  . non-metallic deodorant (ALRA) MISC Apply 1 application topically daily as needed.      Marland Kitchen omeprazole (PRILOSEC) 20 MG capsule Take 20 mg by mouth daily.      . ondansetron (ZOFRAN) 8 MG tablet Take 1 tablet two times a day starting the day after chemo for 2 days. Then take 1 tablet two times a day as needed for nausea or vomiting.  30 tablet  1  . oxyCODONE (OXY IR/ROXICODONE) 5 MG immediate release tablet Take 1 tablet (5 mg total) by mouth every 4 (four) hours as needed for pain.  90 tablet  0  . potassium chloride SA (K-DUR,KLOR-CON) 20 MEQ tablet Take 1 tablet (20 mEq total) by mouth 2 (two) times daily.  20 tablet  0  . prochlorperazine (COMPAZINE) 10 MG tablet Take 1 tablet (10 mg total) by mouth every 6 (six) hours as needed (Nausea or vomiting).  30 tablet  1  . prochlorperazine (COMPAZINE) 25 MG suppository Place 1 suppository (25 mg total) rectally every 12 (twelve) hours as needed for nausea.  12 suppository  3  .  simvastatin (ZOCOR) 20 MG tablet Take 20 mg by mouth daily.       Marland Kitchen zolpidem (AMBIEN) 10 MG tablet Take 10 mg by mouth at bedtime.       No current facility-administered medications for this encounter.   Facility-Administered Medications Ordered in Other Encounters  Medication Dose Route Frequency Provider Last Rate Last Dose  . sodium chloride 0.9 % injection 10 mL  10 mL Intracatheter PRN Victorino December, MD   10 mL at 03/14/12 1628     ALLERGIES: Review of patient's allergies indicates no known allergies.   LABORATORY DATA:  Lab Results  Component Value Date   WBC 6.6 05/23/2012   HGB 11.0* 05/23/2012   HCT 34.1* 05/23/2012   MCV 85.7 05/23/2012   PLT 234 05/23/2012   Lab Results  Component Value Date   NA 138 05/23/2012   K 3.9 05/23/2012   CL 107 05/23/2012   CO2 24 05/23/2012   Lab Results  Component Value Date   ALT 27 05/23/2012   AST 23 05/23/2012   ALKPHOS 74 05/23/2012   BILITOT 0.47 05/23/2012     NARRATIVE: Brandy [name] was seen today for weekly treatment management. The chart was checked  and the patient's films were reviewed. The patient is doing well she states. No significant change in her skin irritation.  PHYSICAL EXAMINATION: vitals were not taken for this visit.     the patient's skin shows diffuse hyperpigmentation/erythema. Radiation dermatitis present in the upper inner aspect of the treatment area most prominently. No moist desquamation.  ASSESSMENT: The patient is doing satisfactorily with treatment.  PLAN: We will continue with the patient's radiation treatment as planned.

## 2012-06-03 ENCOUNTER — Ambulatory Visit: Payer: Medicare Other

## 2012-06-06 ENCOUNTER — Ambulatory Visit
Admission: RE | Admit: 2012-06-06 | Discharge: 2012-06-06 | Disposition: A | Discharge status: Home / Self Care | Payer: Medicare Other | Source: Ambulatory Visit | Attending: Radiation Oncology | Admitting: Radiation Oncology

## 2012-06-06 DIAGNOSIS — C50919 Malignant neoplasm of unspecified site of unspecified female breast: Secondary | ICD-10-CM

## 2012-06-06 MED ORDER — BIAFINE EX EMUL
Freq: Every day | CUTANEOUS | Status: DC
  Administered 2012-06-06: 15:00:00 via TOPICAL

## 2012-06-06 NOTE — Progress Notes (Signed)
Contacted by radiation therapist, Tamera Punt, who was concerned about patient's skin. Hyperpigmentation of left chest wall without desquamation noted. Provided patient with Biafine cream to use instead of Radiaplex. Reinforced skin care. Handed tube to patient's husband and reinforced skin care education with him too. Both verbalized understanding. Patient seen by Dr. Mitzi Hansen while on treatment machine.

## 2012-06-07 ENCOUNTER — Ambulatory Visit
Admission: RE | Admit: 2012-06-07 | Discharge: 2012-06-07 | Disposition: A | Discharge status: Home / Self Care | Payer: Medicare Other | Source: Ambulatory Visit | Attending: Radiation Oncology | Admitting: Radiation Oncology

## 2012-06-08 ENCOUNTER — Ambulatory Visit
Admission: RE | Admit: 2012-06-08 | Discharge: 2012-06-08 | Disposition: A | Discharge status: Home / Self Care | Payer: Medicare Other | Source: Ambulatory Visit | Attending: Radiation Oncology | Admitting: Radiation Oncology

## 2012-06-09 ENCOUNTER — Ambulatory Visit
Admission: RE | Admit: 2012-06-09 | Discharge: 2012-06-09 | Disposition: A | Discharge status: Home / Self Care | Payer: Medicare Other | Source: Ambulatory Visit | Attending: Radiation Oncology | Admitting: Radiation Oncology

## 2012-06-10 ENCOUNTER — Encounter: Payer: Self-pay | Admitting: Radiation Oncology

## 2012-06-10 ENCOUNTER — Ambulatory Visit
Admission: RE | Admit: 2012-06-10 | Discharge: 2012-06-10 | Disposition: A | Discharge status: Home / Self Care | Payer: Medicare Other | Source: Ambulatory Visit | Attending: Radiation Oncology | Admitting: Radiation Oncology

## 2012-06-10 VITALS — BP 138/58 | HR 78 | Temp 98.3°F | Wt 165.0 lb

## 2012-06-10 DIAGNOSIS — C50419 Malignant neoplasm of upper-outer quadrant of unspecified female breast: Secondary | ICD-10-CM

## 2012-06-10 MED ORDER — BIAFINE EX EMUL
CUTANEOUS | Status: DC | PRN
  Administered 2012-06-10: 1 via TOPICAL

## 2012-06-10 NOTE — Progress Notes (Signed)
  Radiation Oncology         [phone] ________________________________  Name: Brandy [name] MRN: [id number]  Date: 05/26/2012  DOB: 02/05/1933  Complex simulation note  The patient has undergone complex simulation for her upcoming boost treatment for her diagnosis of breast cancer. The patient has initially been planned to receive 50.4 gray. The patient will now receive a 10 gray boost to the scar which has been contoured. This will be accomplished using an en face electron field. Based on the depth of the target area, 6 MeV electrons will be used and this field has been normalized to the 90% isodose line. Bolus will be used at 0.5 cm. The patient's final total dose therefore will be 60.4 gray. A special port plan is requested for the boost treatment.   _______________________________  Radene Gunning, MD, PhD

## 2012-06-10 NOTE — Progress Notes (Signed)
Patient has completed left chest wall radiation.Has dry desquamation.Tape removed.Patient instructed to continue application of biafine 2 to 3 times daily until skin has healed over next 3 to 4 weeks.Given appointment card to schedule follow up in one month.Moderate fatigue.

## 2012-06-10 NOTE — Progress Notes (Signed)
Department of Radiation Oncology  Phone:  [phone] Fax:        [phone]  Weekly Treatment Note    Name: Brandy [name] Date: 06/10/2012 MRN: [id number] DOB: 10-08-1932   Current dose: 60.4 Gy  Current fraction: 33   MEDICATIONS: Current Outpatient Prescriptions  Medication Sig Dispense Refill  . amLODipine (NORVASC) 10 MG tablet Take 10 mg by mouth daily.       . citalopram (CELEXA) 20 MG tablet Take 20 mg by mouth daily.      Marland Kitchen dexamethasone (DECADRON) 4 MG tablet Take 2 tablets by mouth daily starting the day after chemotherapy for 2 days. Take with food.  30 tablet  1  . DIOVAN HCT 160-12.5 MG per tablet Take 2 tablets by mouth every morning.       Marland Kitchen emollient (BIAFINE) cream Apply topically as needed.      . hyaluronate sodium (RADIAPLEXRX) GEL Apply topically 2 (two) times daily.      Marland Kitchen lidocaine-prilocaine (EMLA) cream Apply topically as needed.  30 g  4  . LORazepam (ATIVAN) 0.5 MG tablet Take 1 tablet (0.5 mg total) by mouth every 6 (six) hours as needed (Nausea or vomiting).  30 tablet  0  . non-metallic deodorant (ALRA) MISC Apply 1 application topically daily as needed.      Marland Kitchen omeprazole (PRILOSEC) 20 MG capsule Take 20 mg by mouth daily.      . ondansetron (ZOFRAN) 8 MG tablet Take 1 tablet two times a day starting the day after chemo for 2 days. Then take 1 tablet two times a day as needed for nausea or vomiting.  30 tablet  1  . oxyCODONE (OXY IR/ROXICODONE) 5 MG immediate release tablet Take 1 tablet (5 mg total) by mouth every 4 (four) hours as needed for pain.  90 tablet  0  . potassium chloride SA (K-DUR,KLOR-CON) 20 MEQ tablet Take 1 tablet (20 mEq total) by mouth 2 (two) times daily.  20 tablet  0  . prochlorperazine (COMPAZINE) 10 MG tablet Take 1 tablet (10 mg total) by mouth every 6 (six) hours as needed (Nausea or vomiting).  30 tablet  1  . prochlorperazine (COMPAZINE) 25 MG suppository Place 1 suppository (25 mg total) rectally every 12 (twelve)  hours as needed for nausea.  12 suppository  3  . simvastatin (ZOCOR) 20 MG tablet Take 20 mg by mouth daily.       Marland Kitchen zolpidem (AMBIEN) 10 MG tablet Take 10 mg by mouth at bedtime.       Current Facility-Administered Medications  Medication Dose Route Frequency Provider Last Rate Last Dose  . topical emolient (BIAFINE) emulsion   Topical PRN Jonna Coup, MD   1 application at 06/10/12 1527   Facility-Administered Medications Ordered in Other Encounters  Medication Dose Route Frequency Provider Last Rate Last Dose  . sodium chloride 0.9 % injection 10 mL  10 mL Intracatheter PRN Victorino December, MD   10 mL at 03/14/12 1628     ALLERGIES: Review of patient's allergies indicates no known allergies.   LABORATORY DATA:  Lab Results  Component Value Date   WBC 6.6 05/23/2012   HGB 11.0* 05/23/2012   HCT 34.1* 05/23/2012   MCV 85.7 05/23/2012   PLT 234 05/23/2012   Lab Results  Component Value Date   NA 138 05/23/2012   K 3.9 05/23/2012   CL 107 05/23/2012   CO2 24 05/23/2012   Lab Results  Component Value  Date   ALT 27 05/23/2012   AST 23 05/23/2012   ALKPHOS 74 05/23/2012   BILITOT 0.47 05/23/2012     NARRATIVE: Brandy [name] was seen today for weekly treatment management. The chart was checked and the patient's films were reviewed. The patient's treatment course is done. She is very pleased with this. No major changes although continued significant skin irritation.  PHYSICAL EXAMINATION: weight is 165 lb (74.844 kg). Her temperature is 98.3 F (36.8 C). Her blood pressure is 138/58 and her pulse is 78.      the patient has dry desquamation and diffuse radiation change. No moist desquamation.  ASSESSMENT: The patient did satisfactorily with treatment.  PLAN: Followup in one month. She will continue using skin cream for at least 2 weeks on a daily basis.

## 2012-06-13 ENCOUNTER — Ambulatory Visit (HOSPITAL_BASED_OUTPATIENT_CLINIC_OR_DEPARTMENT_OTHER): Payer: Medicare Other | Admitting: Adult Health

## 2012-06-13 ENCOUNTER — Other Ambulatory Visit (HOSPITAL_BASED_OUTPATIENT_CLINIC_OR_DEPARTMENT_OTHER): Payer: Medicare Other | Admitting: Lab

## 2012-06-13 ENCOUNTER — Encounter: Payer: Self-pay | Admitting: Adult Health

## 2012-06-13 ENCOUNTER — Ambulatory Visit (HOSPITAL_BASED_OUTPATIENT_CLINIC_OR_DEPARTMENT_OTHER): Payer: Medicare Other

## 2012-06-13 ENCOUNTER — Ambulatory Visit: Payer: Medicare Other

## 2012-06-13 VITALS — BP 130/66 | HR 97 | Temp 98.1°F | Resp 20 | Ht 59.0 in | Wt 165.5 lb

## 2012-06-13 DIAGNOSIS — Z5111 Encounter for antineoplastic chemotherapy: Secondary | ICD-10-CM

## 2012-06-13 DIAGNOSIS — Z79899 Other long term (current) drug therapy: Secondary | ICD-10-CM

## 2012-06-13 DIAGNOSIS — D6481 Anemia due to antineoplastic chemotherapy: Secondary | ICD-10-CM

## 2012-06-13 DIAGNOSIS — C50919 Malignant neoplasm of unspecified site of unspecified female breast: Secondary | ICD-10-CM

## 2012-06-13 DIAGNOSIS — C773 Secondary and unspecified malignant neoplasm of axilla and upper limb lymph nodes: Secondary | ICD-10-CM

## 2012-06-13 DIAGNOSIS — C50419 Malignant neoplasm of upper-outer quadrant of unspecified female breast: Secondary | ICD-10-CM

## 2012-06-13 DIAGNOSIS — C778 Secondary and unspecified malignant neoplasm of lymph nodes of multiple regions: Secondary | ICD-10-CM

## 2012-06-13 DIAGNOSIS — Z170 Estrogen receptor positive status [ER+]: Secondary | ICD-10-CM

## 2012-06-13 DIAGNOSIS — Z5112 Encounter for antineoplastic immunotherapy: Secondary | ICD-10-CM

## 2012-06-13 DIAGNOSIS — Z7969 Long term (current) use of other immunomodulators and immunosuppressants: Secondary | ICD-10-CM

## 2012-06-13 LAB — CBC WITH DIFFERENTIAL/PLATELET
Eosinophils Absolute: 0.3 10*3/uL (ref 0.0–0.5)
HCT: 33.7 % — ABNORMAL LOW (ref 34.8–46.6)
LYMPH%: 14 % (ref 14.0–49.7)
MCV: 85.3 fL (ref 79.5–101.0)
MONO#: 0.7 10*3/uL (ref 0.1–0.9)
MONO%: 12.5 % (ref 0.0–14.0)
NEUT#: 4 10*3/uL (ref 1.5–6.5)
NEUT%: 67.7 % (ref 38.4–76.8)
Platelets: 246 10*3/uL (ref 145–400)
WBC: 5.8 10*3/uL (ref 3.9–10.3)

## 2012-06-13 LAB — COMPREHENSIVE METABOLIC PANEL (CC13)
BUN: 16 mg/dL (ref 7.0–26.0)
CO2: 24 mEq/L (ref 22–29)
Calcium: 9 mg/dL (ref 8.4–10.4)
Chloride: 106 mEq/L (ref 98–107)
Creatinine: 0.7 mg/dL (ref 0.6–1.1)
Glucose: 110 mg/dl — ABNORMAL HIGH (ref 70–99)
Total Bilirubin: 0.36 mg/dL (ref 0.20–1.20)

## 2012-06-13 MED ORDER — SODIUM CHLORIDE 0.9 % IV SOLN: Freq: Once | INTRAVENOUS | Status: DC

## 2012-06-13 MED ORDER — LETROZOLE 2.5 MG PO TABS: 2.5000 mg | ORAL_TABLET | Freq: Every day | ORAL | Status: DC

## 2012-06-13 MED ORDER — TRASTUZUMAB CHEMO INJECTION 440 MG
6.0000 mg/kg | Freq: Once | INTRAVENOUS | Status: AC
  Administered 2012-06-13: 462 mg via INTRAVENOUS
  Filled 2012-06-13: qty 22

## 2012-06-13 MED ORDER — ACETAMINOPHEN 325 MG PO TABS
650.0000 mg | ORAL_TABLET | Freq: Once | ORAL | Status: AC
  Administered 2012-06-13: 650 mg via ORAL

## 2012-06-13 MED ORDER — SODIUM CHLORIDE 0.9 % IJ SOLN
10.0000 mL | INTRAMUSCULAR | Status: DC | PRN
  Administered 2012-06-13: 10 mL
  Filled 2012-06-13: qty 10

## 2012-06-13 MED ORDER — HEPARIN SOD (PORK) LOCK FLUSH 100 UNIT/ML IV SOLN
500.0000 [IU] | Freq: Once | INTRAVENOUS | Status: AC | PRN
  Administered 2012-06-13: 500 [IU]
  Filled 2012-06-13: qty 5

## 2012-06-13 MED ORDER — DIPHENHYDRAMINE HCL 25 MG PO CAPS
50.0000 mg | ORAL_CAPSULE | Freq: Once | ORAL | Status: AC
  Administered 2012-06-13: 50 mg via ORAL

## 2012-06-13 NOTE — Progress Notes (Signed)
OFFICE PROGRESS NOTE  CC  [name], MD [address] [place] [address] Dr. Emelia Loron Dr. Antony Blackbird  DIAGNOSIS:  78 year old female with new diagnosis of multifocal breast cancer of the left breast. Patient is status post mastectomy with sentinel lymph node biopsy performed on 12/18/2011.   PRIOR THERAPY: 1.Patient has had multiple biopsies performed including endoscopic ultrasound guided biopsies as well as an open biopsy without a definitive diagnosis  #2 patient now with new diagnosis of multifocal breast cancer of the left breast. She underwent a mammogram after she felt a mass in the left breast. The mammogram showed an abnormality. As a biopsy was performed and she was found to have an ace invasive ductal carcinoma. She has now had a mastectomy on 12/18/2011. The final pathology revealed 2 foci of well-differentiated invasive ductal carcinoma one measuring 1.7 cm and the second measuring 0.7 cm there was ductal carcinoma in situ lymphovascular invasion was also identified all surgical margins were negative for carcinoma. Patient went on to also have a left sentinel node biopsy one sentinel node was positive for metastatic disease with focal capsular extension. The tumor was estrogen receptor positive progesterone receptor positive. The largest tumor measuring 1.7 cm was HER-2/neu negative with a Ki-67 of 13%. The smaller tumor showed ER +100% PR receptor 7% proliferation marker 50% and it was HER-2/neu positive with a ratio 4.94.  #3 patient has been seen by radiation oncology and post mastectomy radiation therapy is recommended. At this time she will not get a left axillary lymph node dissection.  #4 patient's tumor is HER-2/neu positive and does she will receive adjuvant chemotherapy and Herceptin based therapy.  #5 patient will proceed with combination chemotherapy consisting of Taxol and Herceptin starting on 02/09/2012. A total of 12 weeks of combination  treatment is planned. Once she completes this she will undergo radiation therapy. After which she will receive Herceptin for one year with 5 years of letrozole 2.5 mg daily.  CURRENT THERAPY: Q 3 week herceptin and Letrozole daily.    INTERVAL HISTORY: Lilianna Case Schroeck 78 y.o. female returns for followup visit today.  She's doing well. She's completed all of her radiation.  She does have radiation effects on her left chest wall, of erythema and peeling.  She is otherwise feeling well and w/o questions/concerns.    MEDICAL HISTORY: Past Medical History  Diagnosis Date  . Asthma   . Hyperlipidemia     takes Simvastatin daily  . Hypertension     takes Amlodipine and Diovan daily  . Cancer     left breast  . Bronchitis     hx of;last time >74yr ago  . Arthritis     shoulders  . Dysphagia   . H/O hiatal hernia   . GERD (gastroesophageal reflux disease)     takes Prilosec prn  . Urinary urgency   . History of kidney stones     many yrs ago  . Depression     takes Celexa daily  . Insomnia     takes Ambien nightly  . Breast cancer 12/18/11    left breast masectomy=metastatic ca in (1/1) lymph node ,invasive ductal ca,2 foci,,dcis,lymph ovascular invasion identified,surgical resection margins neg for ca,additional tissue=benign skin and subcutaneous tissue  . Breast CA 11/23/11    left breast 3 o'clock bx=high grade ductal ca in situ w/comedononecrosis and calcification,dcis,lymphovascular invasion present ,ER?PR+positive  . Full dentures     ALLERGIES:   has no known allergies.  MEDICATIONS:  Current Outpatient Prescriptions  Medication Sig Dispense Refill  . amLODipine (NORVASC) 10 MG tablet Take 10 mg by mouth daily.       . citalopram (CELEXA) 20 MG tablet Take 20 mg by mouth daily.      Marland Kitchen dexamethasone (DECADRON) 4 MG tablet Take 2 tablets by mouth daily starting the day after chemotherapy for 2 days. Take with food.  30 tablet  1  . DIOVAN HCT 160-12.5 MG per tablet Take 2  tablets by mouth every morning.       Marland Kitchen emollient (BIAFINE) cream Apply topically as needed.      . hyaluronate sodium (RADIAPLEXRX) GEL Apply topically 2 (two) times daily.      Marland Kitchen lidocaine-prilocaine (EMLA) cream Apply topically as needed.  30 g  4  . LORazepam (ATIVAN) 0.5 MG tablet Take 1 tablet (0.5 mg total) by mouth every 6 (six) hours as needed (Nausea or vomiting).  30 tablet  0  . non-metallic deodorant (ALRA) MISC Apply 1 application topically daily as needed.      Marland Kitchen omeprazole (PRILOSEC) 20 MG capsule Take 20 mg by mouth daily.      . ondansetron (ZOFRAN) 8 MG tablet Take 1 tablet two times a day starting the day after chemo for 2 days. Then take 1 tablet two times a day as needed for nausea or vomiting.  30 tablet  1  . oxyCODONE (OXY IR/ROXICODONE) 5 MG immediate release tablet Take 1 tablet (5 mg total) by mouth every 4 (four) hours as needed for pain.  90 tablet  0  . potassium chloride SA (K-DUR,KLOR-CON) 20 MEQ tablet Take 1 tablet (20 mEq total) by mouth 2 (two) times daily.  20 tablet  0  . prochlorperazine (COMPAZINE) 10 MG tablet Take 1 tablet (10 mg total) by mouth every 6 (six) hours as needed (Nausea or vomiting).  30 tablet  1  . prochlorperazine (COMPAZINE) 25 MG suppository Place 1 suppository (25 mg total) rectally every 12 (twelve) hours as needed for nausea.  12 suppository  3  . simvastatin (ZOCOR) 20 MG tablet Take 20 mg by mouth daily.       Marland Kitchen zolpidem (AMBIEN) 10 MG tablet Take 10 mg by mouth at bedtime.      Marland Kitchen letrozole (FEMARA) 2.5 MG tablet Take 1 tablet (2.5 mg total) by mouth daily.  90 tablet  12   No current facility-administered medications for this visit.   Facility-Administered Medications Ordered in Other Visits  Medication Dose Route Frequency Provider Last Rate Last Dose  . sodium chloride 0.9 % injection 10 mL  10 mL Intracatheter PRN Victorino December, MD   10 mL at 03/14/12 1628    SURGICAL HISTORY:  Past Surgical History  Procedure Date  .  Breast biopsy 1998    left  . Total shoulder replacement 2011    left  . Eus 06/04/2011    Procedure: UPPER ENDOSCOPIC ULTRASOUND (EUS) LINEAR;  Surgeon: Rob Bunting, MD;  Location: [facility];  Service: Endoscopy;  Laterality: N/A;  . Exploratory laparotomy      biopsy of intra-abdominal mass  . Bladder tack   . Tubal ligation   . Appendectomy   . Dilation and curettage of uterus   . Esophagogastroduodenoscopy   . Mastectomy w/ sentinel node biopsy 12/18/2011    Procedure: MASTECTOMY WITH SENTINEL LYMPH NODE BIOPSY;  Surgeon: Emelia Loron, MD;  Location: [facility] OR;  Service: General;  Laterality: Left;  . Portacath placement 01/27/2012  Procedure: INSERTION PORT-A-CATH;  Surgeon: Emelia Loron, MD;  Location: [facility];  Service: General;  Laterality: Right;  PORT PLACEMENT    REVIEW OF SYSTEMS:   General: fatigue (+), night sweats (-), fever (-), pain (-) Lymph: palpable nodes (-) HEENT: vision changes (-), mucositis (-), gum bleeding (-), epistaxis (-) Cardiovascular: chest pain (-), palpitations (-) Pulmonary: shortness of breath (-), dyspnea on exertion (-), cough (-), hemoptysis (-) GI:  Early satiety (-), melena (-), dysphagia (-), nausea/vomiting (-), diarrhea (-) GU: dysuria (-), hematuria (-), incontinence (-) Musculoskeletal: joint swelling (-), joint pain (-), back pain (-) Neuro: weakness (-), numbness (-), headache (-), confusion (-) Skin: Rash (-), lesions (-), dryness (-) Psych: depression (-), suicidal/homicidal ideation (-), feeling of hopelessness (-)  Health Maintenance  Mammogram: 11/2011 Colonoscopy: Never Bone Density Scan: Never Pap Smear: Years Eye Exam: 2 years ago Vitamin D Level: never Lipid Panel: 2011   PHYSICAL EXAMINATION:  BP 130/66  Pulse 97  Temp 98.1 F (36.7 C) (Oral)  Resp 20  Ht 4\' 11"  (1.499 m)  Wt 165 lb 8 oz (75.07 kg)  BMI 33.43 kg/m2 General: Patient is an elderly appearing female in no acute  distress HEENT: PERRLA, sclerae anicteric no conjunctival pallor, MMM Neck: supple, no palpable adenopathy Lungs: clear to auscultation bilaterally, no wheezes, rhonchi, or rales Cardiovascular: regular rate rhythm, S1, S2, systolic murmur, rubs or gallops Abdomen: Soft, non-tender, non-distended, normoactive bowel sounds, no HSM Extremities: warm and well perfused, no clubbing, cyanosis, or edema Skin: No lesions.  Erythema on left chest wall extending under axilla and covering mastectomy site.  No breakdown.   Neuro: Non-focal Breast: Left mastectomy no nodularity, or local sign of recurrence.  Right breast is without masses ECOG PERFORMANCE STATUS: 1 - Symptomatic but completely ambulatory  LABORATORY DATA: Lab Results  Component Value Date   WBC 5.8 06/13/2012   HGB 10.9* 06/13/2012   HCT 33.7* 06/13/2012   MCV 85.3 06/13/2012   PLT 246 06/13/2012      Chemistry      Component Value Date/Time   NA 138 05/23/2012 1113   NA 139 02/22/2012 1148   K 3.9 05/23/2012 1113   K 4.0 02/22/2012 1148   CL 107 05/23/2012 1113   CL 106 02/22/2012 1148   CO2 24 05/23/2012 1113   CO2 27 02/22/2012 1148   BUN 17.0 05/23/2012 1113   BUN 15 02/22/2012 1148   CREATININE 0.7 05/23/2012 1113   CREATININE 0.52 02/22/2012 1148      Component Value Date/Time   CALCIUM 9.6 05/23/2012 1113   CALCIUM 8.6 02/22/2012 1148   ALKPHOS 74 05/23/2012 1113   ALKPHOS 59 02/22/2012 1148   AST 23 05/23/2012 1113   AST 21 02/22/2012 1148   ALT 27 05/23/2012 1113   ALT 30 02/22/2012 1148   BILITOT 0.47 05/23/2012 1113   BILITOT 0.2* 02/22/2012 1148    ADDITIONAL INFORMATION: 2. PROGNOSTIC INDICATORS - ACIS Results IMMUNOHISTOCHEMICAL AND MORPHOMETRIC ANALYSIS BY THE AUTOMATED CELLULAR IMAGING SYSTEM (ACIS) THE SMALLER TUMOR SHOWS THE FOLLOWING BREAST PROGNOSTIC PROFILE: Estrogen Receptor (Negative, <1%): 100%,POSITIVE, STRONG STAINING INTENSITY Progesterone Receptor (Negative, <1%): 7%,POSITIVE, STRONG STAINING  INTENSITY Proliferation Marker Ki67 by M IB-1 (Low<20%): 50% All controls stained appropriately Abigail Miyamoto MD Pathologist, Electronic Signature ( Signed 12/25/2011) 2. CHROMOGENIC IN-SITU HYBRIDIZATION Interpretation: 0.7 CM SMALLER TUMOR: HER2/NEU BY CISH - SHOWS AMPLIFICATION BY CISH ANALYSIS. THE RATIO OF HER2: CEP 17 SIGNALS WAS 4.94. 1.7 CM LEFT SUPERIOR TUMOR: HER-2/NEU  BY CISH - NO AMPLIFICATION OF HER-2 DETECTED. THE RATIO OF HER-2: CEP 17 SIGNALS WAS 1.14. Reference range: Ratio: HER2:CEP17 < 1.8 - gene amplification not observed Ratio: HER2:CEP 17 1.8-2.2 - equivocal result Ratio: HER2:CEP17 > 2.2 - gene amplification observed 1 of 4 FINAL for [place], Rozanna S [phone]) ADDITIONAL INFORMATION:(continued) Comment: The cancer center was notified on 12-24-2011. Abigail Miyamoto MD Pathologist, Electronic Signature ( Signed 12/24/2011) FINAL DIAGNOSIS Diagnosis 1. Lymph node, sentinel, biopsy, Left - METASTATIC CARCINOMA IN 1 OF 1 LYMPH NODE WITH FOCAL CAPSULAR EXTENSION (1/1). 2. Breast, simple mastectomy, Left - INVASIVE DUCTAL CARCINOMA, TWO FOCI, WELL DIFFERENTIATED, SPANNING 1.7 AND 0.7 CM. - DUCTAL CARCINOMA IN SITU. - LYMPHOVASCULAR INVASION IS IDENTIFIED. - THE SURGICAL RESECTION MARGINS ARE NEGATIVE FOR CARCINOMA. - SEE ONCOLOGY TABLE BELOW. 3. Breast, biopsy, left, additional tissue - BENIGN SKIN AND SUBCUTANEOUS TISSUE. - THERE IS NO EVIDENCE OF MALIGNANCY. Microscopic Comment 2. BREAST, INVASIVE TUMOR, WITH LYMPH NODE SAMPLING Specimen, including laterality: Left breast. Procedure: Simple mastectomy Grade: Both are I Tubule formation: 1 and 1 Nuclear pleomorphism: 1 and 2 Mitotic:1 and 1 Tumor size (gross measurement): 1.7 and 0.7 cm Margins: Negative for carcinoma Invasive, distance to closest margin: 2.0 cm to the deep margin (gross measurement) In-situ, distance to closest margin: 2.0 cm from the deep margin (gross  measurement). Lymphovascular invasion: Present. Ductal carcinoma in situ: Present. Grade: Intermediate grade. Extensive intraductal component: No. Lobular neoplasia: Not identified. Tumor focality: Two foci. Treatment effect: N/A Extent of tumor: Confined to breast parenchyma Lymph nodes: # examined: 1 Lymph nodes with metastasis: 1 Isolated tumor cells (< 0.2 mm): 0 Micrometastasis: (> 0.2 mm and < 2.0 mm): 0 Macrometastasis: (> 2.0 mm): 1 Extracapsular extension: Present, focal. 2 of 4 FINAL for [place], Biridiana S [phone]) Microscopic Comment(continued) Breast prognostic profile: [phone] Estrogen receptor: Positive (100%, strong staining intensity). Progesterone receptor: Positive (100%, strong staining intensity. Her 2 neu: No amplification was detected. The ratio was 1.45. Her 2 neu by CISH will be repeated on both tumors and the results reported separately. Ki-67: 13% Non-neoplastic breast: Healing biopsy site. TNM: mpT1c, pN1a Comments: Grossly, there are two foci of grade I invasive ductal carcinoma present in the simple mastectomy. The larger nodule spans 1.7 cm and is histologically identical to the previous core biopsy, [phone] ("left superior"). The second nodule, 0.7 cm, is grade I invasive ductal carcinoma with extracellular mucin. A complete profile will be performed on this second, smaller nodule. (JBK:gt, 12/22/11) Pecola Leisure MD Pathologist, Electronic Signature   RADIOGRAPHIC STUDIES:  No results found.  ASSESSMENT: 78 year old female with:  #1 new diagnosis of stage II invasive ductal carcinoma of the left breast she is status post mastectomy with finding of 2 foci of invasive ductal carcinoma both are ER/PR positive but the smaller focus measuring 0.7 cm is HER-2/neu positive with a ratio over 4.  #2 patient was begun on adjuvant chemotherapy and Herceptin. With chemotherapy consisting of Taxol given weekly with weekly Herceptin. She  received her first cycle on 02/08/2012.  We are starting every 21 day herceptin today due to side effects of Taxol.  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 Proceed with herceptin only today.  She and I discussed adding on Femara now that she's completed her radiaiton therapy.  I will additionally order a DEXA scan and vitamin D level for her next appt.   2. Return in 3 weeks on 12/30 for follow up for q21d Herceptin.  She knows to  call with any questions or concerns before her next appointment.    I spent >25 minutes counseling the patient face to face. The total time spent in the appointment was 30 minutes.  This case was reviewed with Dr. Welton Flakes.  Cherie Ouch Lyn Hollingshead, NP Medical Oncology [facility] Phone: [phone]  06/13/2012, 11:56 AM

## 2012-06-13 NOTE — Patient Instructions (Addendum)
Cancer Center Discharge Instructions for Patients Receiving Chemotherapy  Today you received the following chemotherapy agents Herceptin.  To help prevent nausea and vomiting after your treatment, we encourage you to take your nausea medication as ordered per MD.    If you develop nausea and vomiting that is not controlled by your nausea medication, call the clinic. If it is after clinic hours your family physician or the after hours number for the clinic or go to the Emergency Department.   BELOW ARE SYMPTOMS THAT SHOULD BE REPORTED IMMEDIATELY:  *FEVER GREATER THAN 100.5 F  *CHILLS WITH OR WITHOUT FEVER  NAUSEA AND VOMITING THAT IS NOT CONTROLLED WITH YOUR NAUSEA MEDICATION  *UNUSUAL SHORTNESS OF BREATH  *UNUSUAL BRUISING OR BLEEDING  TENDERNESS IN MOUTH AND THROAT WITH OR WITHOUT PRESENCE OF ULCERS  *URINARY PROBLEMS  *BOWEL PROBLEMS  UNUSUAL RASH Items with * indicate a potential emergency and should be followed up as soon as possible.  . Please let the nurse know about any problems that you may have experienced. Feel free to call the clinic you have any questions or concerns. The clinic phone number is [phone].   I have been informed and understand all the instructions given to me. I know to contact the clinic, my physician, or go to the Emergency Department if any problems should occur. I do not have any questions at this time, but understand that I may call the clinic during office hours   should I have any questions or need assistance in obtaining follow up care.    __________________________________________  _____________  __________ Signature of Patient or Authorized Representative            Date                   Time    __________________________________________ Nurse's Signature    

## 2012-06-13 NOTE — Patient Instructions (Addendum)
Doing well.  Proceed with Herceptin.  We will start you on Letrozole/Femara with the information below.  Please call us if you have any questions or concerns.   We will see you back in 3 weeks.  Letrozole tablets What is this medicine? LETROZOLE (LET roe zole) blocks the production of estrogen. Certain types of breast cancer grow under the influence of estrogen. Letrozole helps block tumor growth. This medicine is used to treat advanced breast cancer in postmenopausal women. This medicine may be used for other purposes; ask your health care provider or pharmacist if you have questions. What should I tell my health care provider before I take this medicine? They need to know if you have any of these conditions: -liver disease -osteoporosis (weak bones) -an unusual or allergic reaction to letrozole, other medicines, foods, dyes, or preservatives -pregnant or trying to get pregnant -breast-feeding How should I use this medicine? Take this medicine by mouth with a glass of water. You may take it with or without food. Follow the directions on the prescription label. Take your medicine at regular intervals. Do not take your medicine more often than directed. Do not stop taking except on your doctor's advice. Talk to your pediatrician regarding the use of this medicine in children. Special care may be needed. Overdosage: If you think you have taken too much of this medicine contact a poison control center or emergency room at once. NOTE: This medicine is only for you. Do not share this medicine with others. What if I miss a dose? If you miss a dose, take it as soon as you can. If it is almost time for your next dose, take only that dose. Do not take double or extra doses. What may interact with this medicine? Do not take this medicine with any of the following medications: -estrogens, like hormone replacement therapy or birth control pills This medicine may also interact with the following  medications: -dietary supplements such as androstenedione or DHEA -prasterone -tamoxifen This list may not describe all possible interactions. Give your health care provider a list of all the medicines, herbs, non-prescription drugs, or dietary supplements you use. Also tell them if you smoke, drink alcohol, or use illegal drugs. Some items may interact with your medicine. What should I watch for while using this medicine? Visit your doctor or health care professional for regular check-ups to monitor your condition. Do not use this drug if you are pregnant. Serious side effects to an unborn child are possible. Talk to your doctor or pharmacist for more information. You may get drowsy or dizzy. Do not drive, use machinery, or do anything that needs mental alertness until you know how this medicine affects you. Do not stand or sit up quickly, especially if you are an older patient. This reduces the risk of dizzy or fainting spells. What side effects may I notice from receiving this medicine? Side effects that you should report to your doctor or health care professional as soon as possible: -allergic reactions like skin rash, itching, or hives -bone fracture -chest pain -difficulty breathing or shortness of breath -severe pain, swelling, warmth in the leg -unusually weak or tired -vaginal bleeding Side effects that usually do not require medical attention (report to your doctor or health care professional if they continue or are bothersome): -bone, back, joint, or muscle pain -dizziness -fatigue -fluid retention -headache -hot flashes, night sweats -nausea -weight gain This list may not describe all possible side effects. Call your doctor for medical advice  about side effects. You may report side effects to FDA at 1-800-FDA-1088. Where should I keep my medicine? Keep out of the reach of children. Store between 15 and 30 degrees C (59 and 86 degrees F). Throw away any unused medicine after  the expiration date. NOTE: This sheet is a summary. It may not cover all possible information. If you have questions about this medicine, talk to your doctor, pharmacist, or health care provider.  2012, [employer]. (09/02/2007 4:43:44 PM)

## 2012-06-14 ENCOUNTER — Ambulatory Visit (HOSPITAL_COMMUNITY)
Admission: RE | Admit: 2012-06-14 | Discharge: 2012-06-14 | Disposition: A | Discharge status: Home / Self Care | Payer: Medicare Other | Source: Ambulatory Visit | Attending: Internal Medicine | Admitting: Internal Medicine

## 2012-06-14 ENCOUNTER — Ambulatory Visit (HOSPITAL_COMMUNITY)
Admission: RE | Admit: 2012-06-14 | Discharge: 2012-06-14 | Disposition: A | Discharge status: Home / Self Care | Payer: Medicare Other | Source: Ambulatory Visit | Attending: Family Medicine | Admitting: Family Medicine

## 2012-06-14 ENCOUNTER — Ambulatory Visit: Payer: Medicare Other

## 2012-06-14 DIAGNOSIS — Z7969 Long term (current) use of other immunomodulators and immunosuppressants: Secondary | ICD-10-CM

## 2012-06-14 DIAGNOSIS — C50419 Malignant neoplasm of upper-outer quadrant of unspecified female breast: Secondary | ICD-10-CM

## 2012-06-14 DIAGNOSIS — Z79899 Other long term (current) drug therapy: Secondary | ICD-10-CM

## 2012-06-15 NOTE — Progress Notes (Signed)
  Radiation Oncology         [phone] ________________________________  Name: Brandy [name] MRN: [id number]  Date: 04/25/2012  DOB: 1933/02/24  Simulation Verification Note   NARRATIVE: The patient was brought to the treatment unit and placed in the planned treatment position. The clinical setup was verified. Then port films were obtained and uploaded to the radiation oncology medical record software.  The treatment beams were carefully compared against the planned radiation fields. The position, location, and shape of the radiation fields was reviewed. The targeted volume of tissue appears to be appropriately covered by the radiation beams. Based on my personal review, I approved the simulation verification. The patient's treatment will proceed as planned.  ________________________________   Radene Gunning, MD, PhD

## 2012-06-15 NOTE — Progress Notes (Signed)
  Radiation Oncology         [phone] ________________________________  Name: Brandy [name] MRN: [id number]  Date: 06/10/2012  DOB: [birth year]  End of Treatment Note  Diagnosis:   Invasive ductal carcinoma of the left breast     Indication for treatment:  Curative       Radiation treatment dates:   04/26/2012 through 06/10/2012  Site/dose:   The patient was treated initially to the left chest wall and left supraclavicular region to a dose of 50.4 gray at 1.8 gray per fraction. This was completed using a 4 field technique. The patient then received a boost treatment consisting of 2 electron fields divided into a medial electron field and a lateral electron field. This treatment consisted of an additional 10 gray in 5 fractions. The patient's total dose was 60.4 gray. The patient's boost treatment consisted of 6 immediate electrons normalized to the 90% isodose line  Narrative: The patient tolerated radiation treatment relatively well.   She did experience some skin irritation which progress towards the end of treatment. This was managed with different skin creams.  Plan: The patient has completed radiation treatment. The patient will return to radiation oncology clinic for routine followup in one month. I advised the patient to call or return sooner if they have any questions or concerns related to their recovery or treatment. ________________________________  Radene Gunning, M.D., Ph.D.

## 2012-06-20 ENCOUNTER — Ambulatory Visit (HOSPITAL_COMMUNITY): Payer: Medicare Other

## 2012-06-20 ENCOUNTER — Encounter (HOSPITAL_COMMUNITY): Payer: Medicare Other

## 2012-07-04 ENCOUNTER — Other Ambulatory Visit (HOSPITAL_BASED_OUTPATIENT_CLINIC_OR_DEPARTMENT_OTHER): Payer: Medicare Other | Admitting: Lab

## 2012-07-04 ENCOUNTER — Ambulatory Visit (HOSPITAL_BASED_OUTPATIENT_CLINIC_OR_DEPARTMENT_OTHER): Payer: Medicare Other

## 2012-07-04 ENCOUNTER — Telehealth: Payer: Self-pay | Admitting: *Deleted

## 2012-07-04 ENCOUNTER — Encounter: Payer: Self-pay | Admitting: Adult Health

## 2012-07-04 ENCOUNTER — Ambulatory Visit (HOSPITAL_BASED_OUTPATIENT_CLINIC_OR_DEPARTMENT_OTHER): Payer: Medicare Other | Admitting: Adult Health

## 2012-07-04 VITALS — BP 151/71 | HR 99 | Temp 97.8°F | Resp 20 | Ht 59.0 in | Wt 162.5 lb

## 2012-07-04 DIAGNOSIS — C50419 Malignant neoplasm of upper-outer quadrant of unspecified female breast: Secondary | ICD-10-CM

## 2012-07-04 DIAGNOSIS — B029 Zoster without complications: Secondary | ICD-10-CM

## 2012-07-04 DIAGNOSIS — Z5111 Encounter for antineoplastic chemotherapy: Secondary | ICD-10-CM

## 2012-07-04 DIAGNOSIS — D6481 Anemia due to antineoplastic chemotherapy: Secondary | ICD-10-CM

## 2012-07-04 DIAGNOSIS — Z170 Estrogen receptor positive status [ER+]: Secondary | ICD-10-CM

## 2012-07-04 DIAGNOSIS — E559 Vitamin D deficiency, unspecified: Secondary | ICD-10-CM

## 2012-07-04 DIAGNOSIS — N959 Unspecified menopausal and perimenopausal disorder: Secondary | ICD-10-CM

## 2012-07-04 DIAGNOSIS — R52 Pain, unspecified: Secondary | ICD-10-CM

## 2012-07-04 DIAGNOSIS — C50919 Malignant neoplasm of unspecified site of unspecified female breast: Secondary | ICD-10-CM

## 2012-07-04 DIAGNOSIS — Z780 Asymptomatic menopausal state: Secondary | ICD-10-CM

## 2012-07-04 DIAGNOSIS — Z5112 Encounter for antineoplastic immunotherapy: Secondary | ICD-10-CM

## 2012-07-04 DIAGNOSIS — L089 Local infection of the skin and subcutaneous tissue, unspecified: Secondary | ICD-10-CM

## 2012-07-04 DIAGNOSIS — Z79899 Other long term (current) drug therapy: Secondary | ICD-10-CM

## 2012-07-04 DIAGNOSIS — Z7969 Long term (current) use of other immunomodulators and immunosuppressants: Secondary | ICD-10-CM

## 2012-07-04 DIAGNOSIS — T451X5A Adverse effect of antineoplastic and immunosuppressive drugs, initial encounter: Secondary | ICD-10-CM

## 2012-07-04 LAB — COMPREHENSIVE METABOLIC PANEL (CC13)
ALT: 19 U/L (ref 0–55)
AST: 23 U/L (ref 5–34)
Albumin: 3.4 g/dL — ABNORMAL LOW (ref 3.5–5.0)
BUN: 15 mg/dL (ref 7.0–26.0)
CO2: 24 mEq/L (ref 22–29)
Calcium: 9.3 mg/dL (ref 8.4–10.4)
Chloride: 105 mEq/L (ref 98–107)
Potassium: 4 mEq/L (ref 3.5–5.1)

## 2012-07-04 LAB — CBC WITH DIFFERENTIAL/PLATELET
BASO%: 0.4 % (ref 0.0–2.0)
Eosinophils Absolute: 0.4 10*3/uL (ref 0.0–0.5)
HCT: 34.4 % — ABNORMAL LOW (ref 34.8–46.6)
HGB: 11.4 g/dL — ABNORMAL LOW (ref 11.6–15.9)
MCHC: 33.1 g/dL (ref 31.5–36.0)
MONO#: 0.7 10*3/uL (ref 0.1–0.9)
NEUT#: 5.1 10*3/uL (ref 1.5–6.5)
NEUT%: 65.9 % (ref 38.4–76.8)
WBC: 7.7 10*3/uL (ref 3.9–10.3)
lymph#: 1.5 10*3/uL (ref 0.9–3.3)

## 2012-07-04 MED ORDER — DIPHENHYDRAMINE HCL 25 MG PO CAPS
50.0000 mg | ORAL_CAPSULE | Freq: Once | ORAL | Status: AC
  Administered 2012-07-04: 50 mg via ORAL

## 2012-07-04 MED ORDER — TRASTUZUMAB CHEMO INJECTION 440 MG
6.0000 mg/kg | Freq: Once | INTRAVENOUS | Status: AC
  Administered 2012-07-04: 462 mg via INTRAVENOUS
  Filled 2012-07-04: qty 22

## 2012-07-04 MED ORDER — SULFAMETHOXAZOLE-TMP DS 800-160 MG PO TABS: 1.0000 | ORAL_TABLET | Freq: Two times a day (BID) | ORAL | Status: DC

## 2012-07-04 MED ORDER — SODIUM CHLORIDE 0.9 % IJ SOLN
10.0000 mL | INTRAMUSCULAR | Status: DC | PRN
  Administered 2012-07-04: 10 mL
  Filled 2012-07-04: qty 10

## 2012-07-04 MED ORDER — ACYCLOVIR 400 MG PO TABS: 800.0000 mg | ORAL_TABLET | Freq: Every day | ORAL | Status: DC

## 2012-07-04 MED ORDER — ACETAMINOPHEN 325 MG PO TABS
650.0000 mg | ORAL_TABLET | Freq: Once | ORAL | Status: AC
  Administered 2012-07-04: 650 mg via ORAL

## 2012-07-04 MED ORDER — OXYCODONE HCL 5 MG PO TABS: 5.0000 mg | ORAL_TABLET | ORAL | Status: DC | PRN

## 2012-07-04 MED ORDER — HEPARIN SOD (PORK) LOCK FLUSH 100 UNIT/ML IV SOLN
500.0000 [IU] | Freq: Once | INTRAVENOUS | Status: AC | PRN
  Administered 2012-07-04: 500 [IU]
  Filled 2012-07-04: qty 5

## 2012-07-04 MED ORDER — SODIUM CHLORIDE 0.9 % IV SOLN
Freq: Once | INTRAVENOUS | Status: AC
  Administered 2012-07-04: 13:00:00 via INTRAVENOUS

## 2012-07-04 NOTE — Patient Instructions (Signed)
Today you received Herceptin.  Please call [phone] for any problems.   Trastuzumab injection for infusion What is this medicine? TRASTUZUMAB (tras TOO zoo mab) is a monoclonal antibody. It targets a protein called HER2. This protein is found in some stomach and breast cancers. This medicine can stop cancer cell growth. This medicine may be used with other cancer treatments. This medicine may be used for other purposes; ask your health care provider or pharmacist if you have questions. What should I tell my health care provider before I take this medicine? They need to know if you have any of these conditions: -heart disease -heart failure -infection (especially a virus infection such as chickenpox, cold sores, or herpes) -lung or breathing disease, like asthma -recent or ongoing radiation therapy -an unusual or allergic reaction to trastuzumab, benzyl alcohol, or other medications, foods, dyes, or preservatives -pregnant or trying to get pregnant -breast-feeding How should I use this medicine? This drug is given as an infusion into a vein. It is administered in a hospital or clinic by a specially trained health care professional. Talk to your pediatrician regarding the use of this medicine in children. This medicine is not approved for use in children. Overdosage: If you think you have taken too much of this medicine contact a poison control center or emergency room at once. NOTE: This medicine is only for you. Do not share this medicine with others. What if I miss a dose? It is important not to miss a dose. Call your doctor or health care professional if you are unable to keep an appointment. What may interact with this medicine? -cyclophosphamide -doxorubicin -warfarin This list may not describe all possible interactions. Give your health care provider a list of all the medicines, herbs, non-prescription drugs, or dietary supplements you use. Also tell them if you smoke, drink alcohol,  or use illegal drugs. Some items may interact with your medicine. What should I watch for while using this medicine? Visit your doctor for checks on your progress. Report any side effects. Continue your course of treatment even though you feel ill unless your doctor tells you to stop. Call your doctor or health care professional for advice if you get a fever, chills or sore throat, or other symptoms of a cold or flu. Do not treat yourself. Try to avoid being around people who are sick. You may experience fever, chills and shaking during your first infusion. These effects are usually mild and can be treated with other medicines. Report any side effects during the infusion to your health care professional. Fever and chills usually do not happen with later infusions. What side effects may I notice from receiving this medicine? Side effects that you should report to your doctor or other health care professional as soon as possible: -breathing difficulties -chest pain or palpitations -cough -dizziness or fainting -fever or chills, sore throat -skin rash, itching or hives -swelling of the legs or ankles -unusually weak or tired Side effects that usually do not require medical attention (report to your doctor or other health care professional if they continue or are bothersome): -loss of appetite -headache -muscle aches -nausea This list may not describe all possible side effects. Call your doctor for medical advice about side effects. You may report side effects to FDA at 1-800-FDA-1088. Where should I keep my medicine? This drug is given in a hospital or clinic and will not be stored at home. NOTE: This sheet is a summary. It may not cover all possible  information. If you have questions about this medicine, talk to your doctor, pharmacist, or health care provider.  2013, [employer] Standard. (04/26/2009 1:43:15 PM)

## 2012-07-04 NOTE — Patient Instructions (Addendum)
Doing well.  Proceed with Herceptin.  Please take a Vitamin D supplement of 1000 IU daily.  I have ordered a test of your bone density.  Also, I have ordered Acyclovir for your shingles.  You should take 2 tablets 5 times a day.  You also have an infection on your lesions take Bactrim DS 1 tablet twice daily for ten days.  Please call us if you have any questions or concerns.    Shingles Shingles is caused by the same virus that causes chickenpox (varicella zoster virus or VZV). Shingles often occurs many years or decades after having chickenpox. That is why it is more common in adults older than 50 years. The virus reactivates and breaks out as an infection in a nerve root. SYMPTOMS   The initial feeling (sensations) may be pain. This pain is usually described as:  Burning.  Stabbing.  Throbbing.  Tingling in the nerve root.  A red rash will follow in a couple days. The rash may occur in any area of the body and is usually on one side (unilateral) of the body in a band or belt-like pattern. The rash usually starts out as very small blisters (vesicles). They will dry up after 7 to 10 days. This is not usually a significant problem except for the pain it causes.  Long-lasting (chronic) pain is more likely in an elderly person. It can last months to years. This condition is called postherpetic neuralgia. Shingles can be an extremely severe infection in someone with AIDS, a weakened immune system, or with forms of leukemia. It can also be severe if you are taking transplant medicines or other medicines that weaken the immune system. TREATMENT  Your caregiver will often treat you with:  Antiviral drugs.  Anti-inflammatory drugs.  Pain medicines. Bed rest is very important in preventing the pain associated with herpes zoster (postherpetic neuralgia). Application of heat in the form of a hot water bottle or electric heating pad or gentle pressure with the hand is recommended to help with the  pain or discomfort. PREVENTION  A varicella zoster vaccine is available to help protect against the virus. The Food and Drug Administration approved the varicella zoster vaccine for individuals 30 years of age and older. HOME CARE INSTRUCTIONS   Cool compresses to the area of rash may be helpful.  Only take over-the-counter or prescription medicines for pain, discomfort, or fever as directed by your caregiver.  Avoid contact with:  Babies.  Pregnant women.  Children with eczema.  Elderly people with transplants.  People with chronic illnesses, such as leukemia and AIDS.  If the area involved is on your face, you may receive a referral for follow-up to a specialist. It is very important to keep all follow-up appointments. This will help avoid eye complications, chronic pain, or disability. SEEK IMMEDIATE MEDICAL CARE IF:   You develop any pain (headache) in the area of the face or eye. This must be followed carefully by your caregiver or ophthalmologist. An infection in part of your eye (cornea) can be very serious. It could lead to blindness.  You do not have pain relief from prescribed medicines.  Your redness or swelling spreads.  The area involved becomes very swollen and painful.  You have a fever.  You notice any red or painful lines extending away from the affected area toward your heart (lymphangitis).  Your condition is worsening or has changed. Document Released: 06/22/2005 Document Revised: 09/14/2011 Document Reviewed: 05/27/2009 [facility] Patient Information 2013 [place] [employer].  

## 2012-07-04 NOTE — Telephone Encounter (Signed)
Per staff message and POF I have scheduled appts.  JMW  

## 2012-07-04 NOTE — Progress Notes (Signed)
OFFICE PROGRESS NOTE  CC  Brandy Labella, MD [address] [place] [address] Dr. Emelia Loron Dr. Antony Blackbird  DIAGNOSIS:  78 year old female with new diagnosis of multifocal breast cancer of the left breast. Patient is status post mastectomy with sentinel lymph node biopsy performed on 12/18/2011.   PRIOR THERAPY: 1.Patient has had multiple biopsies performed including endoscopic ultrasound guided biopsies as well as an open biopsy without a definitive diagnosis  #2 patient now with new diagnosis of multifocal breast cancer of the left breast. She underwent a mammogram after she felt a mass in the left breast. The mammogram showed an abnormality. As a biopsy was performed and she was found to have an ace invasive ductal carcinoma. She has now had a mastectomy on 12/18/2011. The final pathology revealed 2 foci of well-differentiated invasive ductal carcinoma one measuring 1.7 cm and the second measuring 0.7 cm there was ductal carcinoma in situ lymphovascular invasion was also identified all surgical margins were negative for carcinoma. Patient went on to also have a left sentinel node biopsy one sentinel node was positive for metastatic disease with focal capsular extension. The tumor was estrogen receptor positive progesterone receptor positive. The largest tumor measuring 1.7 cm was HER-2/neu negative with a Ki-67 of 13%. The smaller tumor showed ER +100% PR receptor 7% proliferation marker 50% and it was HER-2/neu positive with a ratio 4.94.  #3 patient has been seen by radiation oncology and post mastectomy radiation therapy is recommended. At this time she will not get a left axillary lymph node dissection.  #4 patient's tumor is HER-2/neu positive and does she will receive adjuvant chemotherapy and Herceptin based therapy.  #5 patient will proceed with combination chemotherapy consisting of Taxol and Herceptin starting on 02/09/2012. A total of 12 weeks of combination  treatment is planned. Once she completes this she will undergo radiation therapy. After which she will receive Herceptin for one year with 5 years of letrozole 2.5 mg daily.  CURRENT THERAPY: Q 3 week herceptin and Letrozole daily.    INTERVAL HISTORY: Brandy [name] 78 y.o. female returns for followup visit today.  She's doing well. She has a rash on her right lower abdomen that began about 7 days ago.  Theres no pain associated with it, but it does itch.  She is tolerating her Letrozole daily, and not having any major side effects.  Otherwise she's had no fevers, chills or any other concerns.    MEDICAL HISTORY: Past Medical History  Diagnosis Date  . Asthma   . Hyperlipidemia     takes Simvastatin daily  . Hypertension     takes Amlodipine and Diovan daily  . Cancer     left breast  . Bronchitis     hx of;last time >59yr ago  . Arthritis     shoulders  . Dysphagia   . H/O hiatal hernia   . GERD (gastroesophageal reflux disease)     takes Prilosec prn  . Urinary urgency   . History of kidney stones     many yrs ago  . Depression     takes Celexa daily  . Insomnia     takes Ambien nightly  . Breast cancer 12/18/11    left breast masectomy=metastatic ca in (1/1) lymph node ,invasive ductal ca,2 foci,,dcis,lymph ovascular invasion identified,surgical resection margins neg for ca,additional tissue=benign skin and subcutaneous tissue  . Breast CA 11/23/11    left breast 3 o'clock bx=high grade ductal ca in situ w/comedononecrosis and  calcification,dcis,lymphovascular invasion present ,ER?PR+positive  . Full dentures     ALLERGIES:   has no known allergies.  MEDICATIONS:  Current Outpatient Prescriptions  Medication Sig Dispense Refill  . amLODipine (NORVASC) 10 MG tablet Take 10 mg by mouth daily.       . citalopram (CELEXA) 20 MG tablet Take 20 mg by mouth daily.      Marland Kitchen DIOVAN HCT 160-12.5 MG per tablet Take 2 tablets by mouth every morning.       Marland Kitchen emollient (BIAFINE)  cream Apply topically as needed.      . hyaluronate sodium (RADIAPLEXRX) GEL Apply topically 2 (two) times daily.      Marland Kitchen letrozole (FEMARA) 2.5 MG tablet Take 1 tablet (2.5 mg total) by mouth daily.  90 tablet  12  . lidocaine-prilocaine (EMLA) cream Apply topically as needed.  30 g  4  . omeprazole (PRILOSEC) 20 MG capsule Take 20 mg by mouth daily.      . ondansetron (ZOFRAN) 8 MG tablet Take 1 tablet two times a day starting the day after chemo for 2 days. Then take 1 tablet two times a day as needed for nausea or vomiting.  30 tablet  1  . oxyCODONE (OXY IR/ROXICODONE) 5 MG immediate release tablet Take 1 tablet (5 mg total) by mouth every 4 (four) hours as needed for pain.  90 tablet  0  . simvastatin (ZOCOR) 20 MG tablet Take 20 mg by mouth daily.       Marland Kitchen zolpidem (AMBIEN) 10 MG tablet Take 10 mg by mouth at bedtime.      Marland Kitchen acyclovir (ZOVIRAX) 400 MG tablet Take 2 tablets (800 mg total) by mouth 5 (five) times daily.  70 tablet  0  . dexamethasone (DECADRON) 4 MG tablet Take 2 tablets by mouth daily starting the day after chemotherapy for 2 days. Take with food.  30 tablet  1  . non-metallic deodorant (ALRA) MISC Apply 1 application topically daily as needed.      . potassium chloride SA (K-DUR,KLOR-CON) 20 MEQ tablet Take 1 tablet (20 mEq total) by mouth 2 (two) times daily.  20 tablet  0  . prochlorperazine (COMPAZINE) 10 MG tablet Take 1 tablet (10 mg total) by mouth every 6 (six) hours as needed (Nausea or vomiting).  30 tablet  1  . prochlorperazine (COMPAZINE) 25 MG suppository Place 1 suppository (25 mg total) rectally every 12 (twelve) hours as needed for nausea.  12 suppository  3  . sulfamethoxazole-trimethoprim (BACTRIM DS) 800-160 MG per tablet Take 1 tablet by mouth 2 (two) times daily.  20 tablet  0   No current facility-administered medications for this visit.   Facility-Administered Medications Ordered in Other Visits  Medication Dose Route Frequency Provider Last Rate Last  Dose  . sodium chloride 0.9 % injection 10 mL  10 mL Intracatheter PRN Victorino December, MD   10 mL at 03/14/12 1628  . trastuzumab (HERCEPTIN) 462 mg in sodium chloride 0.9 % 250 mL chemo infusion  6 mg/kg (Treatment Plan Actual) Intravenous Once Victorino December, MD        SURGICAL HISTORY:  Past Surgical History  Procedure Date  . Breast biopsy 1998    left  . Total shoulder replacement 2011    left  . Eus 06/04/2011    Procedure: UPPER ENDOSCOPIC ULTRASOUND (EUS) LINEAR;  Surgeon: Rob Bunting, MD;  Location: [facility];  Service: Endoscopy;  Laterality: N/A;  . Exploratory laparotomy  biopsy of intra-abdominal mass  . Bladder tack   . Tubal ligation   . Appendectomy   . Dilation and curettage of uterus   . Esophagogastroduodenoscopy   . Mastectomy w/ sentinel node biopsy 12/18/2011    Procedure: MASTECTOMY WITH SENTINEL LYMPH NODE BIOPSY;  Surgeon: Emelia Loron, MD;  Location: [facility] OR;  Service: General;  Laterality: Left;  . Portacath placement 01/27/2012    Procedure: INSERTION PORT-A-CATH;  Surgeon: Emelia Loron, MD;  Location: [facility];  Service: General;  Laterality: Right;  PORT PLACEMENT    REVIEW OF SYSTEMS:   General: fatigue (+), night sweats (-), fever (-), pain (-) Lymph: palpable nodes (-) HEENT: vision changes (-), mucositis (-), gum bleeding (-), epistaxis (-) Cardiovascular: chest pain (-), palpitations (-) Pulmonary: shortness of breath (-), dyspnea on exertion (-), cough (-), hemoptysis (-) GI:  Early satiety (-), melena (-), dysphagia (-), nausea/vomiting (-), diarrhea (-) GU: dysuria (-), hematuria (-), incontinence (-) Musculoskeletal: joint swelling (-), joint pain (-), back pain (-) Neuro: weakness (-), numbness (-), headache (-), confusion (-) Skin: Rash (-), lesions (-), dryness (-) Psych: depression (-), suicidal/homicidal ideation (-), feeling of hopelessness (-)  Health Maintenance  Mammogram: 11/2011 Colonoscopy:  Never Bone Density Scan: Never Pap Smear: Years Eye Exam: 2 years ago Vitamin D Level: never Lipid Panel: 2011   PHYSICAL EXAMINATION:  BP 151/71  Pulse 99  Temp 97.8 F (36.6 C)  Resp 20  Ht 4\' 11"  (1.499 m)  Wt 162 lb 8 oz (73.71 kg)  BMI 32.82 kg/m2 General: Patient is an elderly appearing female in no acute distress HEENT: PERRLA, sclerae anicteric no conjunctival pallor, MMM Neck: supple, no palpable adenopathy Lungs: clear to auscultation bilaterally, no wheezes, rhonchi, or rales Cardiovascular: regular rate rhythm, S1, S2, systolic murmur, rubs or gallops Abdomen: Soft, non-tender, non-distended, normoactive bowel sounds, no HSM Extremities: warm and well perfused, no clubbing, cyanosis, or edema Skin: No lesions.  Erythema on left chest wall extending under axilla and covering mastectomy site.  No breakdown.  Right abd. Linear vesicular lesions, most have encrusted.  Some are surrounded by erythema, there is a very foul odor coming from them and there is some pus around the site.   Neuro: Non-focal Breast: Left mastectomy no nodularity, or local sign of recurrence.  Right breast is without masses ECOG PERFORMANCE STATUS: 1 - Symptomatic but completely ambulatory  LABORATORY DATA: Lab Results  Component Value Date   WBC 7.7 07/04/2012   HGB 11.4* 07/04/2012   HCT 34.4* 07/04/2012   MCV 83.7 07/04/2012   PLT 236 07/04/2012      Chemistry      Component Value Date/Time   NA 139 06/13/2012 1055   NA 139 02/22/2012 1148   K 4.0 06/13/2012 1055   K 4.0 02/22/2012 1148   CL 106 06/13/2012 1055   CL 106 02/22/2012 1148   CO2 24 06/13/2012 1055   CO2 27 02/22/2012 1148   BUN 16.0 06/13/2012 1055   BUN 15 02/22/2012 1148   CREATININE 0.7 06/13/2012 1055   CREATININE 0.52 02/22/2012 1148      Component Value Date/Time   CALCIUM 9.0 06/13/2012 1055   CALCIUM 8.6 02/22/2012 1148   ALKPHOS 75 06/13/2012 1055   ALKPHOS 59 02/22/2012 1148   AST 22 06/13/2012 1055   AST 21  02/22/2012 1148   ALT 23 06/13/2012 1055   ALT 30 02/22/2012 1148   BILITOT 0.36 06/13/2012 1055   BILITOT 0.2* 02/22/2012 1148  ADDITIONAL INFORMATION: 2. PROGNOSTIC INDICATORS - ACIS Results IMMUNOHISTOCHEMICAL AND MORPHOMETRIC ANALYSIS BY THE AUTOMATED CELLULAR IMAGING SYSTEM (ACIS) THE SMALLER TUMOR SHOWS THE FOLLOWING BREAST PROGNOSTIC PROFILE: Estrogen Receptor (Negative, <1%): 100%,POSITIVE, STRONG STAINING INTENSITY Progesterone Receptor (Negative, <1%): 7%,POSITIVE, STRONG STAINING INTENSITY Proliferation Marker Ki67 by M IB-1 (Low<20%): 50% All controls stained appropriately Abigail Miyamoto MD Pathologist, Electronic Signature ( Signed 12/25/2011) 2. CHROMOGENIC IN-SITU HYBRIDIZATION Interpretation: 0.7 CM SMALLER TUMOR: HER2/NEU BY CISH - SHOWS AMPLIFICATION BY CISH ANALYSIS. THE RATIO OF HER2: CEP 17 SIGNALS WAS 4.94. 1.7 CM LEFT SUPERIOR TUMOR: HER-2/NEU BY CISH - NO AMPLIFICATION OF HER-2 DETECTED. THE RATIO OF HER-2: CEP 17 SIGNALS WAS 1.14. Reference range: Ratio: HER2:CEP17 < 1.8 - gene amplification not observed Ratio: HER2:CEP 17 1.8-2.2 - equivocal result Ratio: HER2:CEP17 > 2.2 - gene amplification observed 1 of 4 FINAL for Tangredi, Fenna S [phone]) ADDITIONAL INFORMATION:(continued) Comment: The cancer center was notified on 12-24-2011. Abigail Miyamoto MD Pathologist, Electronic Signature ( Signed 12/24/2011) FINAL DIAGNOSIS Diagnosis 1. Lymph node, sentinel, biopsy, Left - METASTATIC CARCINOMA IN 1 OF 1 LYMPH NODE WITH FOCAL CAPSULAR EXTENSION (1/1). 2. Breast, simple mastectomy, Left - INVASIVE DUCTAL CARCINOMA, TWO FOCI, WELL DIFFERENTIATED, SPANNING 1.7 AND 0.7 CM. - DUCTAL CARCINOMA IN SITU. - LYMPHOVASCULAR INVASION IS IDENTIFIED. - THE SURGICAL RESECTION MARGINS ARE NEGATIVE FOR CARCINOMA. - SEE ONCOLOGY TABLE BELOW. 3. Breast, biopsy, left, additional tissue - BENIGN SKIN AND SUBCUTANEOUS TISSUE. - THERE IS NO EVIDENCE OF  MALIGNANCY. Microscopic Comment 2. BREAST, INVASIVE TUMOR, WITH LYMPH NODE SAMPLING Specimen, including laterality: Left breast. Procedure: Simple mastectomy Grade: Both are I Tubule formation: 1 and 1 Nuclear pleomorphism: 1 and 2 Mitotic:1 and 1 Tumor size (gross measurement): 1.7 and 0.7 cm Margins: Negative for carcinoma Invasive, distance to closest margin: 2.0 cm to the deep margin (gross measurement) In-situ, distance to closest margin: 2.0 cm from the deep margin (gross measurement). Lymphovascular invasion: Present. Ductal carcinoma in situ: Present. Grade: Intermediate grade. Extensive intraductal component: No. Lobular neoplasia: Not identified. Tumor focality: Two foci. Treatment effect: N/A Extent of tumor: Confined to breast parenchyma Lymph nodes: # examined: 1 Lymph nodes with metastasis: 1 Isolated tumor cells (< 0.2 mm): 0 Micrometastasis: (> 0.2 mm and < 2.0 mm): 0 Macrometastasis: (> 2.0 mm): 1 Extracapsular extension: Present, focal. 2 of 4 FINAL for Muse, Iness S [phone]) Microscopic Comment(continued) Breast prognostic profile: [id number] Estrogen receptor: Positive (100%, strong staining intensity). Progesterone receptor: Positive (100%, strong staining intensity. Her 2 neu: No amplification was detected. The ratio was 1.45. Her 2 neu by CISH will be repeated on both tumors and the results reported separately. Ki-67: 13% Non-neoplastic breast: Healing biopsy site. TNM: mpT1c, pN1a Comments: Grossly, there are two foci of grade I invasive ductal carcinoma present in the simple mastectomy. The larger nodule spans 1.7 cm and is histologically identical to the previous core biopsy, [phone] ("left superior"). The second nodule, 0.7 cm, is grade I invasive ductal carcinoma with extracellular mucin. A complete profile will be performed on this second, smaller nodule. (JBK:gt, 12/22/11) Pecola Leisure MD Pathologist, Electronic  Signature   RADIOGRAPHIC STUDIES:  No results found.  ASSESSMENT: 78 year old female with:  #1 new diagnosis of stage II invasive ductal carcinoma of the left breast she is status post mastectomy with finding of 2 foci of invasive ductal carcinoma both are ER/PR positive but the smaller focus measuring 0.7 cm is HER-2/neu positive with a ratio over 4.  #2 patient was begun on adjuvant chemotherapy  and Herceptin. With chemotherapy consisting of Taxol given weekly with weekly Herceptin. She received her first cycle on 02/08/2012.  We are starting every 21 day herceptin today due to side effects of Taxol.  #3 Anemia due to chemotherapy  #4 Shingles and superinfection of lesions   PLAN: 1. Proceed with herceptin and continue daily Femara.    2. Ms. Remmers has shingles, I sent in Acyclovir for her to take a long with Bactrim DS since her lesions appear infected.  She will call if she experiences any fevers, chills, or worsening in the lesions.    3. Return in 3 weeks for q21d Herceptin.  She knows to call with any questions or concerns before her next appointment.    I spent >25 minutes counseling the patient face to face. The total time spent in the appointment was 30 minutes.  This case was reviewed with Dr. Welton Flakes.  Cherie Ouch Lyn Hollingshead, NP Medical Oncology [facility] Phone: [phone]  07/04/2012, 1:04 PM

## 2012-07-04 NOTE — Telephone Encounter (Signed)
herceptin on 1/20, 2/14  Sent michelle email to set up patient treatment

## 2012-07-11 ENCOUNTER — Ambulatory Visit (HOSPITAL_BASED_OUTPATIENT_CLINIC_OR_DEPARTMENT_OTHER)
Admission: RE | Admit: 2012-07-11 | Discharge: 2012-07-11 | Disposition: A | Discharge status: Home / Self Care | Payer: Medicare Other | Source: Ambulatory Visit | Attending: Internal Medicine | Admitting: Internal Medicine

## 2012-07-11 ENCOUNTER — Ambulatory Visit (HOSPITAL_COMMUNITY)
Admission: RE | Admit: 2012-07-11 | Discharge: 2012-07-11 | Disposition: A | Discharge status: Home / Self Care | Payer: Medicare Other | Source: Ambulatory Visit | Attending: Family Medicine | Admitting: Family Medicine

## 2012-07-11 VITALS — BP 158/70 | HR 105 | Wt 162.2 lb

## 2012-07-11 DIAGNOSIS — C50419 Malignant neoplasm of upper-outer quadrant of unspecified female breast: Secondary | ICD-10-CM

## 2012-07-11 DIAGNOSIS — C50919 Malignant neoplasm of unspecified site of unspecified female breast: Secondary | ICD-10-CM | POA: Insufficient documentation

## 2012-07-11 DIAGNOSIS — I369 Nonrheumatic tricuspid valve disorder, unspecified: Secondary | ICD-10-CM

## 2012-07-11 DIAGNOSIS — I10 Essential (primary) hypertension: Secondary | ICD-10-CM | POA: Insufficient documentation

## 2012-07-11 DIAGNOSIS — J45909 Unspecified asthma, uncomplicated: Secondary | ICD-10-CM | POA: Insufficient documentation

## 2012-07-11 DIAGNOSIS — I359 Nonrheumatic aortic valve disorder, unspecified: Secondary | ICD-10-CM | POA: Insufficient documentation

## 2012-07-11 NOTE — Progress Notes (Signed)
Patient ID: Brandy [name], female   DOB: 01-Jul-1933, 79 y.o.   MRN: [id number] Referring Physician: Dr. Welton Flakes Primary Care: Dr. Sigmund Hazel Primary Cardiologist: None   HPI:  Brandy [name] is a 79 y.o. female with history of HTN, HL, GERD, and depression.  She states she had a stress test at one point in time which was normal.    She was recently diagnosed with stage II invasive ductal carcinoma of the left breast and status post mastectomy with finding of 2 foci of invasive ductal carcinoma both are ER/PR positive but the smaller focus measuring 0.7 cm is HER-2/neu positive with a ratio over 4.  After discussion with Dr. Welton Flakes she will begin treatment with Taxol/herceptin on February 09, 2012 for a year.  ECHOs: 01/04/12: EF 60-65%, lateral s' 7.9 07/11/12 EF 60-65% Lateral 8.0  She returns for follow up. Denies PND/CP. Sleeps on 2 pillows. Mild dyspnea with exertion. Complaint with medications. Recovering from shingles and radiation desquamation.    Review of Systems: [y] = yes, [ ]  = no   General: Weight gain [ ] ; Weight loss [ ] ; Anorexia [ ] ; Fatigue [Y ]; Fever [ ] ; Chills [ ] ; Weakness [ ]   Cardiac: Chest pain/pressure [ ] ; Resting SOB [ ] ; Exertional SOB [Y ]; Orthopnea [ ] ; Pedal Edema [ ] ; Palpitations [ ] ; Syncope [ ] ; Presyncope [ ] ; Paroxysmal nocturnal dyspnea[ ]   Pulmonary: Cough [ ] ; Wheezing[ ] ; Hemoptysis[ ] ; Sputum [ ] ; Snoring [ ]   GI: Vomiting[ ] ; Dysphagia[ ] ; Melena[ ] ; Hematochezia [ ] ; Heartburn[ ] ; Abdominal pain [ ] ; Constipation [ ] ; Diarrhea [ ] ; BRBPR [ ]   GU: Hematuria[ ] ; Dysuria [ ] ; Nocturia[ ]   Vascular: Pain in legs with walking [ ] ; Pain in feet with lying flat [ ] ; Non-healing sores [ ] ; Stroke [ ] ; TIA [ ] ; Slurred speech [ ] ;  Neuro: Headaches[ ] ; Vertigo[ ] ; Seizures[ ] ; Paresthesias[ ] ;Blurred vision [ ] ; Diplopia [ ] ; Vision changes [ ]   Ortho/Skin: Arthritis [ ] ; Joint pain [ ] ; Muscle pain [ ] ; Joint swelling [ ] ; Back Pain [ ] ; Rash [ ]   Psych:  Depression[ ] ; Anxiety[ ]   Heme: Bleeding problems [ ] ; Clotting disorders [ ] ; Anemia [ ]   Endocrine: Diabetes [ ] ; Thyroid dysfunction[ ]    Past Medical History  Diagnosis Date  . Asthma   . Hyperlipidemia     takes Simvastatin daily  . Hypertension     takes Amlodipine and Diovan daily  . Cancer     left breast  . Bronchitis     hx of;last time >42yr ago  . Arthritis     shoulders  . Dysphagia   . H/O hiatal hernia   . GERD (gastroesophageal reflux disease)     takes Prilosec prn  . Urinary urgency   . History of kidney stones     many yrs ago  . Depression     takes Celexa daily  . Insomnia     takes Ambien nightly  . Breast cancer 12/18/11    left breast masectomy=metastatic ca in (1/1) lymph node ,invasive ductal ca,2 foci,,dcis,lymph ovascular invasion identified,surgical resection margins neg for ca,additional tissue=benign skin and subcutaneous tissue  . Breast CA 11/23/11    left breast 3 o'clock bx=high grade ductal ca in situ w/comedononecrosis and calcification,dcis,lymphovascular invasion present ,ER?PR+positive  . Full dentures     Current Outpatient Prescriptions  Medication Sig Dispense Refill  . acyclovir (ZOVIRAX) 400 MG tablet Take  2 tablets (800 mg total) by mouth 5 (five) times daily.  70 tablet  0  . amLODipine (NORVASC) 10 MG tablet Take 10 mg by mouth daily.       . citalopram (CELEXA) 20 MG tablet Take 20 mg by mouth daily.      Marland Kitchen dexamethasone (DECADRON) 4 MG tablet Take 2 tablets by mouth daily starting the day after chemotherapy for 2 days. Take with food.  30 tablet  1  . DIOVAN HCT 160-12.5 MG per tablet Take 2 tablets by mouth every morning.       Marland Kitchen emollient (BIAFINE) cream Apply topically as needed.      . hyaluronate sodium (RADIAPLEXRX) GEL Apply topically 2 (two) times daily.      Marland Kitchen letrozole (FEMARA) 2.5 MG tablet Take 1 tablet (2.5 mg total) by mouth daily.  90 tablet  12  . lidocaine-prilocaine (EMLA) cream Apply topically as needed.   30 g  4  . non-metallic deodorant (ALRA) MISC Apply 1 application topically daily as needed.      Marland Kitchen omeprazole (PRILOSEC) 20 MG capsule Take 20 mg by mouth daily.      . ondansetron (ZOFRAN) 8 MG tablet Take 1 tablet two times a day starting the day after chemo for 2 days. Then take 1 tablet two times a day as needed for nausea or vomiting.  30 tablet  1  . oxyCODONE (OXY IR/ROXICODONE) 5 MG immediate release tablet Take 1 tablet (5 mg total) by mouth every 4 (four) hours as needed for pain.  90 tablet  0  . potassium chloride SA (K-DUR,KLOR-CON) 20 MEQ tablet Take 1 tablet (20 mEq total) by mouth 2 (two) times daily.  20 tablet  0  . prochlorperazine (COMPAZINE) 10 MG tablet Take 1 tablet (10 mg total) by mouth every 6 (six) hours as needed (Nausea or vomiting).  30 tablet  1  . prochlorperazine (COMPAZINE) 25 MG suppository Place 1 suppository (25 mg total) rectally every 12 (twelve) hours as needed for nausea.  12 suppository  3  . simvastatin (ZOCOR) 20 MG tablet Take 20 mg by mouth daily.       Marland Kitchen sulfamethoxazole-trimethoprim (BACTRIM DS) 800-160 MG per tablet Take 1 tablet by mouth 2 (two) times daily.  20 tablet  0  . zolpidem (AMBIEN) 10 MG tablet Take 10 mg by mouth at bedtime.       No current facility-administered medications for this encounter.   Facility-Administered Medications Ordered in Other Encounters  Medication Dose Route Frequency Provider Last Rate Last Dose  . sodium chloride 0.9 % injection 10 mL  10 mL Intracatheter PRN Victorino December, MD   10 mL at 03/14/12 1628    No Known Allergies  History   Social History  . Marital Status: Married    Spouse Name: N/A    Number of Children: N/A  . Years of Education: N/A   Occupational History  . Not on file.   Social History Main Topics  . Smoking status: Never Smoker   . Smokeless tobacco: Never Used  . Alcohol Use: No  . Drug Use: No  . Sexually Active: Yes    Birth Control/ Protection: Surgical     Comment:  mensus age 68, 1st pregnancy 71, no hrt gg4,p3, 1 babay lived a few hours complications   Other Topics Concern  . Not on file   Social History Narrative  . No narrative on file    Family History  Problem  Relation Age of Onset  . Cancer Father     prostate  . Cancer Other     breast ca    PHYSICAL EXAM: Filed Vitals:   07/11/12 1437  BP: 158/70  Pulse: 105  Weight: 162 lb 4 oz (73.596 kg)  SpO2: 96%    General:  Elderly appearing. No respiratory difficulty HEENT: normal Neck: supple. JVP 5-6. Carotids 2+ bilat; no bruits. No lymphadenopathy or thryomegaly appreciated. Cor: PMI nondisplaced. Regular rate & rhythm. No rubs, gallops or murmurs.  Port-a-cath wound, healing well  Lungs: clear Abdomen: soft, nontender, nondistended. No hepatosplenomegaly. No bruits or masses. Good bowel sounds. Extremities: no cyanosis, clubbing, rash, edema Neuro: alert & oriented x 3, cranial nerves grossly intact. moves all 4 extremities w/o difficulty. Affect pleasant.    ASSESSMENT & PLAN:

## 2012-07-11 NOTE — Assessment & Plan Note (Addendum)
Dr Gala Romney discussed and reviewed ECHO results. EF and lateral S' stable. No evidence of cardiotoxicity. Follow up in 3 months with an ECHO.   Attending: Patient seen and examined with Tonye Becket, NP. We discussed all aspects of the encounter. I agree with the assessment and plan as stated above.  I reviewed echos personally. EF and Doppler parameters stable. No HF on exam. Continue Herceptin.

## 2012-07-11 NOTE — Patient Instructions (Addendum)
Follow up in 3 months with an ECHO and Dr Bensimhon 

## 2012-07-11 NOTE — Progress Notes (Signed)
  Echocardiogram 2D Echocardiogram has been performed.  Jorje Guild 07/11/2012, 2:36 PM

## 2012-07-13 ENCOUNTER — Encounter: Payer: Self-pay | Admitting: Radiation Oncology

## 2012-07-13 DIAGNOSIS — R3915 Urgency of urination: Secondary | ICD-10-CM | POA: Insufficient documentation

## 2012-07-13 DIAGNOSIS — Z87442 Personal history of urinary calculi: Secondary | ICD-10-CM | POA: Insufficient documentation

## 2012-07-13 DIAGNOSIS — R1310 Dysphagia, unspecified: Secondary | ICD-10-CM | POA: Insufficient documentation

## 2012-07-13 DIAGNOSIS — K219 Gastro-esophageal reflux disease without esophagitis: Secondary | ICD-10-CM | POA: Insufficient documentation

## 2012-07-13 DIAGNOSIS — I10 Essential (primary) hypertension: Secondary | ICD-10-CM | POA: Insufficient documentation

## 2012-07-13 DIAGNOSIS — Z923 Personal history of irradiation: Secondary | ICD-10-CM | POA: Insufficient documentation

## 2012-07-13 DIAGNOSIS — F32A Depression, unspecified: Secondary | ICD-10-CM | POA: Insufficient documentation

## 2012-07-13 DIAGNOSIS — F329 Major depressive disorder, single episode, unspecified: Secondary | ICD-10-CM | POA: Insufficient documentation

## 2012-07-13 DIAGNOSIS — M1990 Unspecified osteoarthritis, unspecified site: Secondary | ICD-10-CM | POA: Insufficient documentation

## 2012-07-13 DIAGNOSIS — Z8719 Personal history of other diseases of the digestive system: Secondary | ICD-10-CM | POA: Insufficient documentation

## 2012-07-13 DIAGNOSIS — E785 Hyperlipidemia, unspecified: Secondary | ICD-10-CM | POA: Insufficient documentation

## 2012-07-13 DIAGNOSIS — J40 Bronchitis, not specified as acute or chronic: Secondary | ICD-10-CM | POA: Insufficient documentation

## 2012-07-14 ENCOUNTER — Ambulatory Visit
Admission: RE | Admit: 2012-07-14 | Discharge: 2012-07-14 | Disposition: A | Discharge status: Home / Self Care | Payer: Medicare Other | Source: Ambulatory Visit | Attending: Radiation Oncology | Admitting: Radiation Oncology

## 2012-07-14 ENCOUNTER — Encounter: Payer: Self-pay | Admitting: Radiation Oncology

## 2012-07-14 VITALS — BP 150/61 | HR 83 | Temp 98.8°F | Resp 20 | Wt 162.5 lb

## 2012-07-14 DIAGNOSIS — C50419 Malignant neoplasm of upper-outer quadrant of unspecified female breast: Secondary | ICD-10-CM

## 2012-07-14 NOTE — Progress Notes (Signed)
Radiation Oncology         [phone] ________________________________  Name: Brandy [name] MRN: [id number]  Date: 07/14/2012  DOB: 10-Sep-1932  Follow-Up Visit Note  CC: [name], MD  Victorino December, MD  Diagnosis:   Invasive ductal carcinoma of the left breast  Interval Since Last Radiation:  One month   Narrative:  The patient returns today for routine follow-up.  The patient is doing well. She does complain of some ongoing fatigue. She is pleased with how her skin has healed. She continues on Herceptin she states as well as Femara which she has begun. Some tightness in the chest wall area, no significant increase.                              ALLERGIES:   has no known allergies.  Meds: Current Outpatient Prescriptions  Medication Sig Dispense Refill  . acyclovir (ZOVIRAX) 400 MG tablet Take 2 tablets (800 mg total) by mouth 5 (five) times daily.  70 tablet  0  . amLODipine (NORVASC) 10 MG tablet Take 10 mg by mouth daily.       . citalopram (CELEXA) 20 MG tablet Take 20 mg by mouth daily.      Marland Kitchen dexamethasone (DECADRON) 4 MG tablet Take 2 tablets by mouth daily starting the day after chemotherapy for 2 days. Take with food.  30 tablet  1  . DIOVAN HCT 160-12.5 MG per tablet Take 2 tablets by mouth every morning.       Marland Kitchen letrozole (FEMARA) 2.5 MG tablet Take 1 tablet (2.5 mg total) by mouth daily.  90 tablet  12  . lidocaine-prilocaine (EMLA) cream Apply topically as needed.  30 g  4  . omeprazole (PRILOSEC) 20 MG capsule Take 20 mg by mouth daily.      . ondansetron (ZOFRAN) 8 MG tablet Take 1 tablet two times a day starting the day after chemo for 2 days. Then take 1 tablet two times a day as needed for nausea or vomiting.  30 tablet  1  . oxyCODONE (OXY IR/ROXICODONE) 5 MG immediate release tablet Take 1 tablet (5 mg total) by mouth every 4 (four) hours as needed for pain.  90 tablet  0  . potassium chloride SA (K-DUR,KLOR-CON) 20 MEQ tablet Take 1 tablet (20 mEq  total) by mouth 2 (two) times daily.  20 tablet  0  . prochlorperazine (COMPAZINE) 10 MG tablet Take 1 tablet (10 mg total) by mouth every 6 (six) hours as needed (Nausea or vomiting).  30 tablet  1  . prochlorperazine (COMPAZINE) 25 MG suppository Place 1 suppository (25 mg total) rectally every 12 (twelve) hours as needed for nausea.  12 suppository  3  . simvastatin (ZOCOR) 20 MG tablet Take 20 mg by mouth daily.       Marland Kitchen sulfamethoxazole-trimethoprim (BACTRIM DS) 800-160 MG per tablet Take 1 tablet by mouth 2 (two) times daily.  20 tablet  0  . zolpidem (AMBIEN) 10 MG tablet Take 10 mg by mouth at bedtime.       No current facility-administered medications for this encounter.   Facility-Administered Medications Ordered in Other Encounters  Medication Dose Route Frequency Provider Last Rate Last Dose  . sodium chloride 0.9 % injection 10 mL  10 mL Intracatheter PRN Victorino December, MD   10 mL at 03/14/12 1628    Physical Findings: The patient is in no acute distress.  Patient is alert and oriented.  weight is 162 lb 8 oz (73.71 kg). Her oral temperature is 98.8 F (37.1 C). Her blood pressure is 150/61 and her pulse is 83. Her respiration is 20. .   The patient's skin in the treatment area exhibit some hyperpigmentation and a little bit of ongoing desquamation. Overall her skin has been healing well.  Lab Findings: Lab Results  Component Value Date   WBC 7.7 07/04/2012   HGB 11.4* 07/04/2012   HCT 34.4* 07/04/2012   MCV 83.7 07/04/2012   PLT 236 07/04/2012     Radiographic Findings: No results found.  Impression:    The patient is doing well 1 month after completing adjuvant radiotherapy. Her skin looks fine today.  Plan:  She will followup in our clinic on a when necessary basis.   Radene Gunning, M.D., Ph.D.

## 2012-07-14 NOTE — Progress Notes (Signed)
Pt states her left breast/chest wall "stays sore". She c/o pain in her joints, worse w/cold weather, and fatigue. Takes Oxycodone w/good relief. Her appetite "comes and goes". Pt on Femara.

## 2012-07-25 ENCOUNTER — Other Ambulatory Visit (HOSPITAL_BASED_OUTPATIENT_CLINIC_OR_DEPARTMENT_OTHER): Payer: Medicare Other | Admitting: Lab

## 2012-07-25 ENCOUNTER — Ambulatory Visit (HOSPITAL_BASED_OUTPATIENT_CLINIC_OR_DEPARTMENT_OTHER): Payer: Medicare Other

## 2012-07-25 ENCOUNTER — Ambulatory Visit (HOSPITAL_BASED_OUTPATIENT_CLINIC_OR_DEPARTMENT_OTHER): Payer: Medicare Other | Admitting: Adult Health

## 2012-07-25 ENCOUNTER — Encounter: Payer: Self-pay | Admitting: Adult Health

## 2012-07-25 VITALS — BP 152/70 | HR 83 | Temp 98.5°F | Resp 20 | Ht 59.0 in | Wt 161.2 lb

## 2012-07-25 DIAGNOSIS — G589 Mononeuropathy, unspecified: Secondary | ICD-10-CM

## 2012-07-25 DIAGNOSIS — C50919 Malignant neoplasm of unspecified site of unspecified female breast: Secondary | ICD-10-CM

## 2012-07-25 DIAGNOSIS — C50419 Malignant neoplasm of upper-outer quadrant of unspecified female breast: Secondary | ICD-10-CM

## 2012-07-25 DIAGNOSIS — Z5112 Encounter for antineoplastic immunotherapy: Secondary | ICD-10-CM

## 2012-07-25 DIAGNOSIS — Z79899 Other long term (current) drug therapy: Secondary | ICD-10-CM

## 2012-07-25 DIAGNOSIS — E559 Vitamin D deficiency, unspecified: Secondary | ICD-10-CM

## 2012-07-25 DIAGNOSIS — G629 Polyneuropathy, unspecified: Secondary | ICD-10-CM

## 2012-07-25 DIAGNOSIS — T451X5A Adverse effect of antineoplastic and immunosuppressive drugs, initial encounter: Secondary | ICD-10-CM

## 2012-07-25 DIAGNOSIS — D6481 Anemia due to antineoplastic chemotherapy: Secondary | ICD-10-CM

## 2012-07-25 DIAGNOSIS — R52 Pain, unspecified: Secondary | ICD-10-CM

## 2012-07-25 LAB — COMPREHENSIVE METABOLIC PANEL (CC13)
AST: 18 U/L (ref 5–34)
Albumin: 3 g/dL — ABNORMAL LOW (ref 3.5–5.0)
Alkaline Phosphatase: 79 U/L (ref 40–150)
Potassium: 4 mEq/L (ref 3.5–5.1)
Sodium: 140 mEq/L (ref 136–145)
Total Protein: 6.8 g/dL (ref 6.4–8.3)

## 2012-07-25 LAB — CBC WITH DIFFERENTIAL/PLATELET
EOS%: 4.6 % (ref 0.0–7.0)
MCH: 26.8 pg (ref 25.1–34.0)
MCV: 83 fL (ref 79.5–101.0)
MONO%: 8.4 % (ref 0.0–14.0)
NEUT#: 5.5 10*3/uL (ref 1.5–6.5)
RBC: 3.95 10*6/uL (ref 3.70–5.45)
RDW: 14.2 % (ref 11.2–14.5)

## 2012-07-25 MED ORDER — OXYCODONE HCL 5 MG PO TABS: 5.0000 mg | ORAL_TABLET | ORAL | Status: DC | PRN

## 2012-07-25 MED ORDER — LORAZEPAM 2 MG/ML IJ SOLN: 0.5000 mg | Freq: Once | INTRAMUSCULAR | Status: DC

## 2012-07-25 MED ORDER — GABAPENTIN 100 MG PO CAPS: 300.0000 mg | ORAL_CAPSULE | Freq: Every day | ORAL | Status: DC

## 2012-07-25 MED ORDER — DIPHENHYDRAMINE HCL 25 MG PO CAPS
50.0000 mg | ORAL_CAPSULE | Freq: Once | ORAL | Status: AC
  Administered 2012-07-25: 50 mg via ORAL

## 2012-07-25 MED ORDER — ACETAMINOPHEN 325 MG PO TABS
650.0000 mg | ORAL_TABLET | Freq: Once | ORAL | Status: AC
  Administered 2012-07-25: 650 mg via ORAL

## 2012-07-25 MED ORDER — LETROZOLE 2.5 MG PO TABS: 2.5000 mg | ORAL_TABLET | Freq: Every day | ORAL | Status: DC

## 2012-07-25 MED ORDER — TRASTUZUMAB CHEMO INJECTION 440 MG
6.0000 mg/kg | Freq: Once | INTRAVENOUS | Status: AC
  Administered 2012-07-25: 462 mg via INTRAVENOUS
  Filled 2012-07-25: qty 22

## 2012-07-25 NOTE — Patient Instructions (Addendum)
[facility] Discharge Instructions for Patients Receiving Chemotherapy  Today you received the following chemotherapy agents: Herceptin.  To help prevent nausea and vomiting after your treatment, we encourage you to take your nausea medication.    If you develop nausea and vomiting that is not controlled by your nausea medication, call the clinic. If it is after clinic hours your family physician or the after hours number for the clinic or go to the Emergency Department.   BELOW ARE SYMPTOMS THAT SHOULD BE REPORTED IMMEDIATELY:  *FEVER GREATER THAN 100.5 F  *CHILLS WITH OR WITHOUT FEVER  NAUSEA AND VOMITING THAT IS NOT CONTROLLED WITH YOUR NAUSEA MEDICATION  *UNUSUAL SHORTNESS OF BREATH  *UNUSUAL BRUISING OR BLEEDING  TENDERNESS IN MOUTH AND THROAT WITH OR WITHOUT PRESENCE OF ULCERS  *URINARY PROBLEMS  *BOWEL PROBLEMS  UNUSUAL RASH Items with * indicate a potential emergency and should be followed up as soon as possible.   Feel free to call the clinic you have any questions or concerns. The clinic phone number is [phone].

## 2012-07-25 NOTE — Progress Notes (Signed)
OFFICE PROGRESS NOTE  CC  [name], MD [address] [place] [address] Dr. Emelia Loron Dr. Antony Blackbird  DIAGNOSIS:  79 year old female with new diagnosis of multifocal breast cancer of the left breast. Patient is status post mastectomy with sentinel lymph node biopsy performed on 12/18/2011.   PRIOR THERAPY: 1.Patient has had multiple biopsies performed including endoscopic ultrasound guided biopsies as well as an open biopsy without a definitive diagnosis  #2 patient now with new diagnosis of multifocal breast cancer of the left breast. She underwent a mammogram after she felt a mass in the left breast. The mammogram showed an abnormality. As a biopsy was performed and she was found to have an ace invasive ductal carcinoma. She has now had a mastectomy on 12/18/2011. The final pathology revealed 2 foci of well-differentiated invasive ductal carcinoma one measuring 1.7 cm and the second measuring 0.7 cm there was ductal carcinoma in situ lymphovascular invasion was also identified all surgical margins were negative for carcinoma. Patient went on to also have a left sentinel node biopsy one sentinel node was positive for metastatic disease with focal capsular extension. The tumor was estrogen receptor positive progesterone receptor positive. The largest tumor measuring 1.7 cm was HER-2/neu negative with a Ki-67 of 13%. The smaller tumor showed ER +100% PR receptor 7% proliferation marker 50% and it was HER-2/neu positive with a ratio 4.94.  #3 patient has been seen by radiation oncology and post mastectomy radiation therapy is recommended. At this time she will not get a left axillary lymph node dissection.  #4 patient's tumor is HER-2/neu positive and does she will receive adjuvant chemotherapy and Herceptin based therapy.  #5 patient will proceed with combination chemotherapy consisting of Taxol and Herceptin starting on 02/09/2012. A total of 12 weeks of combination  treatment is planned. Once she completes this she will undergo radiation therapy. After which she will receive Herceptin for one year with 5 years of letrozole 2.5 mg daily.  CURRENT THERAPY: Q 3 week herceptin and Letrozole daily.    INTERVAL HISTORY: [place] Welcome Mangas 79 y.o. female returns for followup visit today.  She's doing well. Her shingles and superinfection have resolved.  She is now c/o bilateral foot pain that's been going on for a while, but would like medication for it.  It is worse at night and occasionally burns.  She had an echo and Dr. Gala Romney appt on 1/6, and was cleared to continue Herceptin therapy.  She denies fevers, chills, weakness, or any other concerns.     MEDICAL HISTORY: Past Medical History  Diagnosis Date  . Asthma   . Hyperlipidemia     takes Simvastatin daily  . Hypertension     takes Amlodipine and Diovan daily  . Cancer     left breast  . Bronchitis     hx of;last time >50yr ago  . Arthritis     shoulders  . Dysphagia   . H/O hiatal hernia   . GERD (gastroesophageal reflux disease)     takes Prilosec prn  . Urinary urgency   . History of kidney stones     many yrs ago  . Depression     takes Celexa daily  . Insomnia     takes Ambien nightly  . Breast cancer 12/18/11    left breast masectomy=metastatic ca in (1/1) lymph node ,invasive ductal ca,2 foci,,dcis,lymph ovascular invasion identified,surgical resection margins neg for ca,additional tissue=benign skin and subcutaneous tissue  . Breast CA 11/23/11  left breast 3 o'clock bx=high grade ductal ca in situ w/comedononecrosis and calcification,dcis,lymphovascular invasion present ,ER?PR+positive  . Full dentures   . Hx of radiation therapy 04/26/12 -06/10/12    left breast    ALLERGIES:   has no known allergies.  MEDICATIONS:  Current Outpatient Prescriptions  Medication Sig Dispense Refill  . amLODipine (NORVASC) 10 MG tablet Take 10 mg by mouth daily.       . citalopram (CELEXA) 20  MG tablet Take 20 mg by mouth daily.      Marland Kitchen DIOVAN HCT 160-12.5 MG per tablet Take 2 tablets by mouth every morning.       Marland Kitchen letrozole (FEMARA) 2.5 MG tablet Take 1 tablet (2.5 mg total) by mouth daily.  90 tablet  12  . lidocaine-prilocaine (EMLA) cream Apply topically as needed.  30 g  4  . omeprazole (PRILOSEC) 20 MG capsule Take 20 mg by mouth daily.      Marland Kitchen oxyCODONE (OXY IR/ROXICODONE) 5 MG immediate release tablet Take 1 tablet (5 mg total) by mouth every 4 (four) hours as needed for pain.  90 tablet  0  . simvastatin (ZOCOR) 20 MG tablet Take 20 mg by mouth daily.       Marland Kitchen zolpidem (AMBIEN) 10 MG tablet Take 10 mg by mouth at bedtime.      Marland Kitchen acyclovir (ZOVIRAX) 400 MG tablet Take 2 tablets (800 mg total) by mouth 5 (five) times daily.  70 tablet  0  . dexamethasone (DECADRON) 4 MG tablet Take 2 tablets by mouth daily starting the day after chemotherapy for 2 days. Take with food.  30 tablet  1  . gabapentin (NEURONTIN) 100 MG capsule Take 3 capsules (300 mg total) by mouth at bedtime.  90 capsule  0  . ondansetron (ZOFRAN) 8 MG tablet Take 1 tablet two times a day starting the day after chemo for 2 days. Then take 1 tablet two times a day as needed for nausea or vomiting.  30 tablet  1  . potassium chloride SA (K-DUR,KLOR-CON) 20 MEQ tablet Take 1 tablet (20 mEq total) by mouth 2 (two) times daily.  20 tablet  0  . prochlorperazine (COMPAZINE) 10 MG tablet Take 1 tablet (10 mg total) by mouth every 6 (six) hours as needed (Nausea or vomiting).  30 tablet  1  . prochlorperazine (COMPAZINE) 25 MG suppository Place 1 suppository (25 mg total) rectally every 12 (twelve) hours as needed for nausea.  12 suppository  3  . sulfamethoxazole-trimethoprim (BACTRIM DS) 800-160 MG per tablet Take 1 tablet by mouth 2 (two) times daily.  20 tablet  0   No current facility-administered medications for this visit.   Facility-Administered Medications Ordered in Other Visits  Medication Dose Route Frequency  Provider Last Rate Last Dose  . sodium chloride 0.9 % injection 10 mL  10 mL Intracatheter PRN Victorino December, MD   10 mL at 03/14/12 1628    SURGICAL HISTORY:  Past Surgical History  Procedure Date  . Breast biopsy 1998    left  . Total shoulder replacement 2011    left  . Eus 06/04/2011    Procedure: UPPER ENDOSCOPIC ULTRASOUND (EUS) LINEAR;  Surgeon: Rob Bunting, MD;  Location: [facility];  Service: Endoscopy;  Laterality: N/A;  . Exploratory laparotomy      biopsy of intra-abdominal mass  . Bladder tack   . Tubal ligation   . Appendectomy   . Dilation and curettage of uterus   .  Esophagogastroduodenoscopy   . Mastectomy w/ sentinel node biopsy 12/18/2011    Procedure: MASTECTOMY WITH SENTINEL LYMPH NODE BIOPSY;  Surgeon: Emelia Loron, MD;  Location: [facility] OR;  Service: General;  Laterality: Left;  . Portacath placement 01/27/2012    Procedure: INSERTION PORT-A-CATH;  Surgeon: Emelia Loron, MD;  Location: [facility];  Service: General;  Laterality: Right;  PORT PLACEMENT    REVIEW OF SYSTEMS:   General: fatigue (+), night sweats (-), fever (-), pain (-) Lymph: palpable nodes (-) HEENT: vision changes (-), mucositis (-), gum bleeding (-), epistaxis (-) Cardiovascular: chest pain (-), palpitations (-) Pulmonary: shortness of breath (-), dyspnea on exertion (-), cough (-), hemoptysis (-) GI:  Early satiety (-), melena (-), dysphagia (-), nausea/vomiting (-), diarrhea (-) GU: dysuria (-), hematuria (-), incontinence (-) Musculoskeletal: joint swelling (-), joint pain (-), back pain (-) Neuro: weakness (-), numbness (-), headache (-), confusion (-) Skin: Rash (-), lesions (-), dryness (-) Psych: depression (-), suicidal/homicidal ideation (-), feeling of hopelessness (-)  Health Maintenance  Mammogram: 11/2011 Colonoscopy: Never Bone Density Scan: Never Pap Smear: Years Eye Exam: 2 years ago Vitamin D Level: never Lipid Panel: 2011   PHYSICAL  EXAMINATION:  BP 152/70  Pulse 83  Temp 98.5 F (36.9 C) (Oral)  Resp 20  Ht 4\' 11"  (1.499 m)  Wt 161 lb 3.2 oz (73.12 kg)  BMI 32.56 kg/m2 General: Patient is an elderly appearing female in no acute distress HEENT: PERRLA, sclerae anicteric no conjunctival pallor, MMM Neck: supple, no palpable adenopathy Lungs: clear to auscultation bilaterally, no wheezes, rhonchi, or rales Cardiovascular: regular rate rhythm, S1, S2, systolic murmur, rubs or gallops Abdomen: Soft, non-tender, non-distended, normoactive bowel sounds, no HSM Extremities: warm and well perfused, no clubbing, cyanosis, or edema Skin: No rash or lesions, previous shingles and superinfection have healed. Neuro: Non-focal Breast: Left mastectomy no nodularity, or local sign of recurrence.  Right breast is without masses ECOG PERFORMANCE STATUS: 1 - Symptomatic but completely ambulatory  LABORATORY DATA: Lab Results  Component Value Date   WBC 7.8 07/25/2012   HGB 10.6* 07/25/2012   HCT 32.8* 07/25/2012   MCV 83.0 07/25/2012   PLT 302 07/25/2012      Chemistry      Component Value Date/Time   NA 141 07/04/2012 1050   NA 139 02/22/2012 1148   K 4.0 07/04/2012 1050   K 4.0 02/22/2012 1148   CL 105 07/04/2012 1050   CL 106 02/22/2012 1148   CO2 24 07/04/2012 1050   CO2 27 02/22/2012 1148   BUN 15.0 07/04/2012 1050   BUN 15 02/22/2012 1148   CREATININE 0.7 07/04/2012 1050   CREATININE 0.52 02/22/2012 1148      Component Value Date/Time   CALCIUM 9.3 07/04/2012 1050   CALCIUM 8.6 02/22/2012 1148   ALKPHOS 85 07/04/2012 1050   ALKPHOS 59 02/22/2012 1148   AST 23 07/04/2012 1050   AST 21 02/22/2012 1148   ALT 19 07/04/2012 1050   ALT 30 02/22/2012 1148   BILITOT 0.32 07/04/2012 1050   BILITOT 0.2* 02/22/2012 1148    ADDITIONAL INFORMATION: 2. PROGNOSTIC INDICATORS - ACIS Results IMMUNOHISTOCHEMICAL AND MORPHOMETRIC ANALYSIS BY THE AUTOMATED CELLULAR IMAGING SYSTEM (ACIS) THE SMALLER TUMOR SHOWS THE FOLLOWING  BREAST PROGNOSTIC PROFILE: Estrogen Receptor (Negative, <1%): 100%,POSITIVE, STRONG STAINING INTENSITY Progesterone Receptor (Negative, <1%): 7%,POSITIVE, STRONG STAINING INTENSITY Proliferation Marker Ki67 by M IB-1 (Low<20%): 50% All controls stained appropriately Abigail Miyamoto MD Pathologist, Electronic Signature ( Signed 12/25/2011)  2. CHROMOGENIC IN-SITU HYBRIDIZATION Interpretation: 0.7 CM SMALLER TUMOR: HER2/NEU BY CISH - SHOWS AMPLIFICATION BY CISH ANALYSIS. THE RATIO OF HER2: CEP 17 SIGNALS WAS 4.94. 1.7 CM LEFT SUPERIOR TUMOR: HER-2/NEU BY CISH - NO AMPLIFICATION OF HER-2 DETECTED. THE RATIO OF HER-2: CEP 17 SIGNALS WAS 1.14. Reference range: Ratio: HER2:CEP17 < 1.8 - gene amplification not observed Ratio: HER2:CEP 17 1.8-2.2 - equivocal result Ratio: HER2:CEP17 > 2.2 - gene amplification observed 1 of 4 FINAL for Glore, Michon S [phone]) ADDITIONAL INFORMATION:(continued) Comment: The cancer center was notified on 12-24-2011. Abigail Miyamoto MD Pathologist, Electronic Signature ( Signed 12/24/2011) FINAL DIAGNOSIS Diagnosis 1. Lymph node, sentinel, biopsy, Left - METASTATIC CARCINOMA IN 1 OF 1 LYMPH NODE WITH FOCAL CAPSULAR EXTENSION (1/1). 2. Breast, simple mastectomy, Left - INVASIVE DUCTAL CARCINOMA, TWO FOCI, WELL DIFFERENTIATED, SPANNING 1.7 AND 0.7 CM. - DUCTAL CARCINOMA IN SITU. - LYMPHOVASCULAR INVASION IS IDENTIFIED. - THE SURGICAL RESECTION MARGINS ARE NEGATIVE FOR CARCINOMA. - SEE ONCOLOGY TABLE BELOW. 3. Breast, biopsy, left, additional tissue - BENIGN SKIN AND SUBCUTANEOUS TISSUE. - THERE IS NO EVIDENCE OF MALIGNANCY. Microscopic Comment 2. BREAST, INVASIVE TUMOR, WITH LYMPH NODE SAMPLING Specimen, including laterality: Left breast. Procedure: Simple mastectomy Grade: Both are I Tubule formation: 1 and 1 Nuclear pleomorphism: 1 and 2 Mitotic:1 and 1 Tumor size (gross measurement): 1.7 and 0.7 cm Margins: Negative for carcinoma Invasive,  distance to closest margin: 2.0 cm to the deep margin (gross measurement) In-situ, distance to closest margin: 2.0 cm from the deep margin (gross measurement). Lymphovascular invasion: Present. Ductal carcinoma in situ: Present. Grade: Intermediate grade. Extensive intraductal component: No. Lobular neoplasia: Not identified. Tumor focality: Two foci. Treatment effect: N/A Extent of tumor: Confined to breast parenchyma Lymph nodes: # examined: 1 Lymph nodes with metastasis: 1 Isolated tumor cells (< 0.2 mm): 0 Micrometastasis: (> 0.2 mm and < 2.0 mm): 0 Macrometastasis: (> 2.0 mm): 1 Extracapsular extension: Present, focal. 2 of 4 FINAL for Orvis, Cricket S [phone]) Microscopic Comment(continued) Breast prognostic profile: [phone] Estrogen receptor: Positive (100%, strong staining intensity). Progesterone receptor: Positive (100%, strong staining intensity. Her 2 neu: No amplification was detected. The ratio was 1.45. Her 2 neu by CISH will be repeated on both tumors and the results reported separately. Ki-67: 13% Non-neoplastic breast: Healing biopsy site. TNM: mpT1c, pN1a Comments: Grossly, there are two foci of grade I invasive ductal carcinoma present in the simple mastectomy. The larger nodule spans 1.7 cm and is histologically identical to the previous core biopsy, [phone] ("left superior"). The second nodule, 0.7 cm, is grade I invasive ductal carcinoma with extracellular mucin. A complete profile will be performed on this second, smaller nodule. (JBK:gt, 12/22/11) Pecola Leisure MD Pathologist, Electronic Signature   RADIOGRAPHIC STUDIES:  No results found.  ASSESSMENT: 79 year old female with:  #1 new diagnosis of stage II invasive ductal carcinoma of the left breast she is status post mastectomy with finding of 2 foci of invasive ductal carcinoma both are ER/PR positive but the smaller focus measuring 0.7 cm is HER-2/neu positive with a ratio over  4.  #2 patient was begun on adjuvant chemotherapy and Herceptin. With chemotherapy consisting of Taxol given weekly with weekly Herceptin. She received her first cycle on 02/08/2012.  We are starting every 21 day herceptin today due to side effects of Taxol.  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 Proceed with herceptin and continue daily Femara.    2. I gave her a prescription for Gabapentin for her  foot pain.  She will call if it isn't improved.    3. Return on 08/19/12 for her next dose of Herceptin.  She knows to call with any questions or concerns before her next appointment.    I spent >25 minutes counseling the patient face to face. The total time spent in the appointment was 30 minutes.  This case was reviewed with Dr. Welton Flakes.  Cherie Ouch Lyn Hollingshead, NP Medical Oncology [facility] Phone: [phone]  07/25/2012, 2:07 PM

## 2012-07-25 NOTE — Patient Instructions (Addendum)
Doing well.  Proceed with Herceptin today.  Please call us if you have any questions or concerns.     We will see you back on 08/19/12.

## 2012-07-27 ENCOUNTER — Other Ambulatory Visit: Payer: Self-pay | Admitting: Certified Registered Nurse Anesthetist

## 2012-08-15 ENCOUNTER — Encounter (INDEPENDENT_AMBULATORY_CARE_PROVIDER_SITE_OTHER): Payer: Self-pay | Admitting: General Surgery

## 2012-08-18 ENCOUNTER — Other Ambulatory Visit: Payer: Self-pay

## 2012-08-19 ENCOUNTER — Ambulatory Visit: Payer: Medicare Other | Admitting: Oncology

## 2012-08-19 ENCOUNTER — Other Ambulatory Visit: Payer: Medicare Other

## 2012-08-19 ENCOUNTER — Ambulatory Visit: Payer: Medicare Other

## 2012-08-24 ENCOUNTER — Telehealth: Payer: Self-pay | Admitting: Oncology

## 2012-08-25 ENCOUNTER — Other Ambulatory Visit: Payer: Medicare Other | Admitting: Lab

## 2012-08-25 ENCOUNTER — Encounter: Payer: Self-pay | Admitting: Oncology

## 2012-08-25 ENCOUNTER — Ambulatory Visit (HOSPITAL_BASED_OUTPATIENT_CLINIC_OR_DEPARTMENT_OTHER): Payer: Medicare Other | Admitting: Oncology

## 2012-08-25 ENCOUNTER — Telehealth: Payer: Self-pay | Admitting: Oncology

## 2012-08-25 ENCOUNTER — Telehealth: Payer: Self-pay | Admitting: *Deleted

## 2012-08-25 ENCOUNTER — Ambulatory Visit (HOSPITAL_BASED_OUTPATIENT_CLINIC_OR_DEPARTMENT_OTHER): Payer: Medicare Other

## 2012-08-25 VITALS — BP 165/74 | HR 85 | Temp 98.1°F | Resp 20 | Ht 59.0 in | Wt 165.5 lb

## 2012-08-25 DIAGNOSIS — D6481 Anemia due to antineoplastic chemotherapy: Secondary | ICD-10-CM

## 2012-08-25 DIAGNOSIS — C50919 Malignant neoplasm of unspecified site of unspecified female breast: Secondary | ICD-10-CM

## 2012-08-25 DIAGNOSIS — R1900 Intra-abdominal and pelvic swelling, mass and lump, unspecified site: Secondary | ICD-10-CM

## 2012-08-25 DIAGNOSIS — G609 Hereditary and idiopathic neuropathy, unspecified: Secondary | ICD-10-CM

## 2012-08-25 DIAGNOSIS — Z5112 Encounter for antineoplastic immunotherapy: Secondary | ICD-10-CM

## 2012-08-25 DIAGNOSIS — Z170 Estrogen receptor positive status [ER+]: Secondary | ICD-10-CM

## 2012-08-25 DIAGNOSIS — R52 Pain, unspecified: Secondary | ICD-10-CM

## 2012-08-25 DIAGNOSIS — G629 Polyneuropathy, unspecified: Secondary | ICD-10-CM

## 2012-08-25 DIAGNOSIS — C50419 Malignant neoplasm of upper-outer quadrant of unspecified female breast: Secondary | ICD-10-CM

## 2012-08-25 DIAGNOSIS — T451X5A Adverse effect of antineoplastic and immunosuppressive drugs, initial encounter: Secondary | ICD-10-CM

## 2012-08-25 LAB — COMPREHENSIVE METABOLIC PANEL (CC13)
Albumin: 2.8 g/dL — ABNORMAL LOW (ref 3.5–5.0)
CO2: 27 mEq/L (ref 22–29)
Calcium: 9.3 mg/dL (ref 8.4–10.4)
Chloride: 108 mEq/L — ABNORMAL HIGH (ref 98–107)
Glucose: 127 mg/dl — ABNORMAL HIGH (ref 70–99)
Sodium: 143 mEq/L (ref 136–145)
Total Bilirubin: 0.29 mg/dL (ref 0.20–1.20)
Total Protein: 6.6 g/dL (ref 6.4–8.3)

## 2012-08-25 LAB — CBC WITH DIFFERENTIAL/PLATELET
Basophils Absolute: 0 10*3/uL (ref 0.0–0.1)
EOS%: 4 % (ref 0.0–7.0)
MCH: 27.6 pg (ref 25.1–34.0)
MCV: 81.6 fL (ref 79.5–101.0)
MONO%: 7.1 % (ref 0.0–14.0)
RBC: 3.9 10*6/uL (ref 3.70–5.45)
RDW: 15.5 % — ABNORMAL HIGH (ref 11.2–14.5)

## 2012-08-25 LAB — FERRITIN: Ferritin: 50 ng/mL (ref 10–291)

## 2012-08-25 LAB — IRON AND TIBC: Iron: 55 ug/dL (ref 42–145)

## 2012-08-25 MED ORDER — OXYCODONE HCL 5 MG PO TABS: 5.0000 mg | ORAL_TABLET | ORAL | Status: DC | PRN

## 2012-08-25 MED ORDER — ACETAMINOPHEN 325 MG PO TABS
650.0000 mg | ORAL_TABLET | Freq: Once | ORAL | Status: AC
  Administered 2012-08-25: 650 mg via ORAL

## 2012-08-25 MED ORDER — DIPHENHYDRAMINE HCL 25 MG PO CAPS
50.0000 mg | ORAL_CAPSULE | Freq: Once | ORAL | Status: AC
  Administered 2012-08-25: 50 mg via ORAL

## 2012-08-25 MED ORDER — SODIUM CHLORIDE 0.9 % IJ SOLN
10.0000 mL | INTRAMUSCULAR | Status: DC | PRN
  Administered 2012-08-25: 10 mL
  Filled 2012-08-25: qty 10

## 2012-08-25 MED ORDER — GABAPENTIN 100 MG PO CAPS: 300.0000 mg | ORAL_CAPSULE | Freq: Every day | ORAL | Status: DC

## 2012-08-25 MED ORDER — SODIUM CHLORIDE 0.9 % IV SOLN
Freq: Once | INTRAVENOUS | Status: AC
  Administered 2012-08-25: 12:00:00 via INTRAVENOUS

## 2012-08-25 MED ORDER — TRASTUZUMAB CHEMO INJECTION 440 MG
6.0000 mg/kg | Freq: Once | INTRAVENOUS | Status: AC
  Administered 2012-08-25: 462 mg via INTRAVENOUS
  Filled 2012-08-25: qty 22

## 2012-08-25 MED ORDER — HEPARIN SOD (PORK) LOCK FLUSH 100 UNIT/ML IV SOLN
500.0000 [IU] | Freq: Once | INTRAVENOUS | Status: AC | PRN
  Administered 2012-08-25: 500 [IU]
  Filled 2012-08-25: qty 5

## 2012-08-25 NOTE — Patient Instructions (Addendum)
Proceed with herceptin today  Return in 3 weeks for follow up, labs and herceptin

## 2012-08-25 NOTE — Telephone Encounter (Signed)
Per staff phone call and POF I have schedueld appts.  JMW  

## 2012-08-25 NOTE — Patient Instructions (Signed)
 Cancer Center Discharge Instructions for Patients Receiving Chemotherapy  Today you received the following chemotherapy agents: Herceptin  To help prevent nausea and vomiting after your treatment, we encourage you to take your nausea medication as directed by your MD.   If you develop nausea and vomiting that is not controlled by your nausea medication, call the clinic. If it is after clinic hours your family physician or the after hours number for the clinic or go to the Emergency Department.   BELOW ARE SYMPTOMS THAT SHOULD BE REPORTED IMMEDIATELY:  *FEVER GREATER THAN 100.5 F  *CHILLS WITH OR WITHOUT FEVER  NAUSEA AND VOMITING THAT IS NOT CONTROLLED WITH YOUR NAUSEA MEDICATION  *UNUSUAL SHORTNESS OF BREATH  *UNUSUAL BRUISING OR BLEEDING  TENDERNESS IN MOUTH AND THROAT WITH OR WITHOUT PRESENCE OF ULCERS  *URINARY PROBLEMS  *BOWEL PROBLEMS  UNUSUAL RASH Items with * indicate a potential emergency and should be followed up as soon as possible.  Feel free to call the clinic you have any questions or concerns. The clinic phone number is [phone].

## 2012-08-26 ENCOUNTER — Telehealth: Payer: Self-pay | Admitting: Oncology

## 2012-09-04 NOTE — Progress Notes (Signed)
OFFICE PROGRESS NOTE  CC  [name], MD [address] [place] [address] Dr. Emelia Loron Dr. Antony Blackbird  DIAGNOSIS:  79 year old female with new diagnosis of multifocal breast cancer of the left breast. Patient is status post mastectomy with sentinel lymph node biopsy performed on 12/18/2011.   PRIOR THERAPY: 1.Patient has had multiple biopsies performed including endoscopic ultrasound guided biopsies as well as an open biopsy without a definitive diagnosis  #2 patient now with new diagnosis of multifocal breast cancer of the left breast. She underwent a mammogram after she felt a mass in the left breast. The mammogram showed an abnormality. As a biopsy was performed and she was found to have an ace invasive ductal carcinoma. She has now had a mastectomy on 12/18/2011. The final pathology revealed 2 foci of well-differentiated invasive ductal carcinoma one measuring 1.7 cm and the second measuring 0.7 cm there was ductal carcinoma in situ lymphovascular invasion was also identified all surgical margins were negative for carcinoma. Patient went on to also have a left sentinel node biopsy one sentinel node was positive for metastatic disease with focal capsular extension. The tumor was estrogen receptor positive progesterone receptor positive. The largest tumor measuring 1.7 cm was HER-2/neu negative with a Ki-67 of 13%. The smaller tumor showed ER +100% PR receptor 7% proliferation marker 50% and it was HER-2/neu positive with a ratio 4.94.  #3 patient has been seen by radiation oncology and post mastectomy radiation therapy is recommended. At this time she will not get a left axillary lymph node dissection.  #4 patient's tumor is HER-2/neu positive and does she will receive adjuvant chemotherapy and Herceptin based therapy.  #5 patient will proceed with combination chemotherapy consisting of Taxol and Herceptin starting on 02/09/2012. A total of 12 weeks of combination  treatment is planned. Once she completes this she will undergo radiation therapy. After which she will receive Herceptin for one year with 5 years of letrozole 2.5 mg daily.  CURRENT THERAPY: Q 3 week herceptin and Letrozole daily.    INTERVAL HISTORY: Brandy [name] 79 y.o. female returns for followup visit today.  She's doing well.  She  c/o bilateral foot pain that's been going on for a while, but would like medication for it.  It is worse at night and occasionally burns.  She had an echo and Dr. Gala Romney appt on 1/6, and was cleared to continue Herceptin therapy.  She denies fevers, chills, weakness, or any other concerns.     MEDICAL HISTORY: Past Medical History  Diagnosis Date  . Asthma   . Hyperlipidemia     takes Simvastatin daily  . Hypertension     takes Amlodipine and Diovan daily  . Cancer     left breast  . Bronchitis     hx of;last time >67yr ago  . Arthritis     shoulders  . Dysphagia   . H/O hiatal hernia   . GERD (gastroesophageal reflux disease)     takes Prilosec prn  . Urinary urgency   . History of kidney stones     many yrs ago  . Depression     takes Celexa daily  . Insomnia     takes Ambien nightly  . Breast cancer 12/18/11    left breast masectomy=metastatic ca in (1/1) lymph node ,invasive ductal ca,2 foci,,dcis,lymph ovascular invasion identified,surgical resection margins neg for ca,additional tissue=benign skin and subcutaneous tissue  . Breast CA 11/23/11    left breast 3 o'clock bx=high  grade ductal ca in situ w/comedononecrosis and calcification,dcis,lymphovascular invasion present ,ER?PR+positive  . Full dentures   . Hx of radiation therapy 04/26/12 -06/10/12    left breast    ALLERGIES:  has No Known Allergies.  MEDICATIONS:  Current Outpatient Prescriptions  Medication Sig Dispense Refill  . amLODipine (NORVASC) 10 MG tablet Take 10 mg by mouth daily.       . citalopram (CELEXA) 20 MG tablet Take 20 mg by mouth daily.      Marland Kitchen DIOVAN HCT  160-12.5 MG per tablet Take 2 tablets by mouth every morning.       . gabapentin (NEURONTIN) 100 MG capsule Take 3 capsules (300 mg total) by mouth at bedtime.  90 capsule  0  . letrozole (FEMARA) 2.5 MG tablet Take 1 tablet (2.5 mg total) by mouth daily.  90 tablet  12  . lidocaine-prilocaine (EMLA) cream Apply topically as needed.  30 g  4  . omeprazole (PRILOSEC) 20 MG capsule Take 20 mg by mouth daily.      Marland Kitchen oxyCODONE (OXY IR/ROXICODONE) 5 MG immediate release tablet Take 1 tablet (5 mg total) by mouth every 4 (four) hours as needed for pain.  90 tablet  0  . simvastatin (ZOCOR) 20 MG tablet Take 20 mg by mouth daily.       Marland Kitchen zolpidem (AMBIEN) 10 MG tablet Take 10 mg by mouth at bedtime.      Marland Kitchen acyclovir (ZOVIRAX) 400 MG tablet Take 2 tablets (800 mg total) by mouth 5 (five) times daily.  70 tablet  0  . potassium chloride SA (K-DUR,KLOR-CON) 20 MEQ tablet Take 1 tablet (20 mEq total) by mouth 2 (two) times daily.  20 tablet  0  . sulfamethoxazole-trimethoprim (BACTRIM DS) 800-160 MG per tablet Take 1 tablet by mouth 2 (two) times daily.  20 tablet  0   No current facility-administered medications for this visit.   Facility-Administered Medications Ordered in Other Visits  Medication Dose Route Frequency Dorthea Maina Last Rate Last Dose  . sodium chloride 0.9 % injection 10 mL  10 mL Intracatheter PRN Victorino December, MD   10 mL at 03/14/12 1628    SURGICAL HISTORY:  Past Surgical History  Procedure Laterality Date  . Breast biopsy  1998    left  . Total shoulder replacement  2011    left  . Eus  06/04/2011    Procedure: UPPER ENDOSCOPIC ULTRASOUND (EUS) LINEAR;  Surgeon: Rob Bunting, MD;  Location: [facility];  Service: Endoscopy;  Laterality: N/A;  . Exploratory laparotomy       biopsy of intra-abdominal mass  . Bladder tack    . Tubal ligation    . Appendectomy    . Dilation and curettage of uterus    . Esophagogastroduodenoscopy    . Mastectomy w/ sentinel node biopsy   12/18/2011    Procedure: MASTECTOMY WITH SENTINEL LYMPH NODE BIOPSY;  Surgeon: Emelia Loron, MD;  Location: [facility] OR;  Service: General;  Laterality: Left;  . Portacath placement  01/27/2012    Procedure: INSERTION PORT-A-CATH;  Surgeon: Emelia Loron, MD;  Location: [facility];  Service: General;  Laterality: Right;  PORT PLACEMENT    REVIEW OF SYSTEMS:   General: fatigue (+), night sweats (-), fever (-), pain (-) Lymph: palpable nodes (-) HEENT: vision changes (-), mucositis (-), gum bleeding (-), epistaxis (-) Cardiovascular: chest pain (-), palpitations (-) Pulmonary: shortness of breath (-), dyspnea on exertion (-), cough (-), hemoptysis (-) GI:  Early satiety (-), melena (-), dysphagia (-), nausea/vomiting (-), diarrhea (-) GU: dysuria (-), hematuria (-), incontinence (-) Musculoskeletal: joint swelling (-), joint pain (-), back pain (-) Neuro: weakness (-), numbness (-), headache (-), confusion (-) Skin: Rash (-), lesions (-), dryness (-) Psych: depression (-), suicidal/homicidal ideation (-), feeling of hopelessness (-)  Health Maintenance  Mammogram: 11/2011 Colonoscopy: Never Bone Density Scan: Never Pap Smear: Years Eye Exam: 2 years ago Vitamin D Level: never Lipid Panel: 2011   PHYSICAL EXAMINATION:  BP 165/74  Pulse 85  Temp(Src) 98.1 F (36.7 C) (Oral)  Resp 20  Ht 4\' 11"  (1.499 m)  Wt 165 lb 8 oz (75.07 kg)  BMI 33.41 kg/m2 General: Patient is an elderly appearing female in no acute distress HEENT: PERRLA, sclerae anicteric no conjunctival pallor, MMM Neck: supple, no palpable adenopathy Lungs: clear to auscultation bilaterally, no wheezes, rhonchi, or rales Cardiovascular: regular rate rhythm, S1, S2, systolic murmur, rubs or gallops Abdomen: Soft, non-tender, non-distended, normoactive bowel sounds, no HSM Extremities: warm and well perfused, no clubbing, cyanosis, or edema Skin: No rash or lesions, previous shingles and  superinfection have healed. Neuro: Non-focal Breast: Left mastectomy no nodularity, or local sign of recurrence.  Right breast is without masses ECOG PERFORMANCE STATUS: 1 - Symptomatic but completely ambulatory  LABORATORY DATA: Lab Results  Component Value Date   WBC 6.6 08/25/2012   HGB 10.8* 08/25/2012   HCT 31.8* 08/25/2012   MCV 81.6 08/25/2012   PLT 210 08/25/2012      Chemistry      Component Value Date/Time   NA 143 08/25/2012 1013   NA 139 02/22/2012 1148   K 3.7 08/25/2012 1013   K 4.0 02/22/2012 1148   CL 108* 08/25/2012 1013   CL 106 02/22/2012 1148   CO2 27 08/25/2012 1013   CO2 27 02/22/2012 1148   BUN 13.7 08/25/2012 1013   BUN 15 02/22/2012 1148   CREATININE 0.6 08/25/2012 1013   CREATININE 0.52 02/22/2012 1148      Component Value Date/Time   CALCIUM 9.3 08/25/2012 1013   CALCIUM 8.6 02/22/2012 1148   ALKPHOS 81 08/25/2012 1013   ALKPHOS 59 02/22/2012 1148   AST 15 08/25/2012 1013   AST 21 02/22/2012 1148   ALT 16 08/25/2012 1013   ALT 30 02/22/2012 1148   BILITOT 0.29 08/25/2012 1013   BILITOT 0.2* 02/22/2012 1148    ADDITIONAL INFORMATION: 2. PROGNOSTIC INDICATORS - ACIS Results IMMUNOHISTOCHEMICAL AND MORPHOMETRIC ANALYSIS BY THE AUTOMATED CELLULAR IMAGING SYSTEM (ACIS) THE SMALLER TUMOR SHOWS THE FOLLOWING BREAST PROGNOSTIC PROFILE: Estrogen Receptor (Negative, <1%): 100%,POSITIVE, STRONG STAINING INTENSITY Progesterone Receptor (Negative, <1%): 7%,POSITIVE, STRONG STAINING INTENSITY Proliferation Marker Ki67 by M IB-1 (Low<20%): 50% All controls stained appropriately Abigail Miyamoto MD Pathologist, Electronic Signature ( Signed 12/25/2011) 2. CHROMOGENIC IN-SITU HYBRIDIZATION Interpretation: 0.7 CM SMALLER TUMOR: HER2/NEU BY CISH - SHOWS AMPLIFICATION BY CISH ANALYSIS. THE RATIO OF HER2: CEP 17 SIGNALS WAS 4.94. 1.7 CM LEFT SUPERIOR TUMOR: HER-2/NEU BY CISH - NO AMPLIFICATION OF HER-2 DETECTED. THE RATIO OF HER-2: CEP 17 SIGNALS WAS 1.14. Reference  range: Ratio: HER2:CEP17 < 1.8 - gene amplification not observed Ratio: HER2:CEP 17 1.8-2.2 - equivocal result Ratio: HER2:CEP17 > 2.2 - gene amplification observed 1 of 4 FINAL for Mohs, Anastasiya S [id number]) ADDITIONAL INFORMATION:(continued) Comment: The cancer center was notified on 12-24-2011. Abigail Miyamoto MD Pathologist, Electronic Signature ( Signed 12/24/2011) FINAL DIAGNOSIS Diagnosis 1. Lymph node, sentinel, biopsy, Left - METASTATIC CARCINOMA IN 1 OF  1 LYMPH NODE WITH FOCAL CAPSULAR EXTENSION (1/1). 2. Breast, simple mastectomy, Left - INVASIVE DUCTAL CARCINOMA, TWO FOCI, WELL DIFFERENTIATED, SPANNING 1.7 AND 0.7 CM. - DUCTAL CARCINOMA IN SITU. - LYMPHOVASCULAR INVASION IS IDENTIFIED. - THE SURGICAL RESECTION MARGINS ARE NEGATIVE FOR CARCINOMA. - SEE ONCOLOGY TABLE BELOW. 3. Breast, biopsy, left, additional tissue - BENIGN SKIN AND SUBCUTANEOUS TISSUE. - THERE IS NO EVIDENCE OF MALIGNANCY. Microscopic Comment 2. BREAST, INVASIVE TUMOR, WITH LYMPH NODE SAMPLING Specimen, including laterality: Left breast. Procedure: Simple mastectomy Grade: Both are I Tubule formation: 1 and 1 Nuclear pleomorphism: 1 and 2 Mitotic:1 and 1 Tumor size (gross measurement): 1.7 and 0.7 cm Margins: Negative for carcinoma Invasive, distance to closest margin: 2.0 cm to the deep margin (gross measurement) In-situ, distance to closest margin: 2.0 cm from the deep margin (gross measurement). Lymphovascular invasion: Present. Ductal carcinoma in situ: Present. Grade: Intermediate grade. Extensive intraductal component: No. Lobular neoplasia: Not identified. Tumor focality: Two foci. Treatment effect: N/A Extent of tumor: Confined to breast parenchyma Lymph nodes: # examined: 1 Lymph nodes with metastasis: 1 Isolated tumor cells (< 0.2 mm): 0 Micrometastasis: (> 0.2 mm and < 2.0 mm): 0 Macrometastasis: (> 2.0 mm): 1 Extracapsular extension: Present, focal. 2 of 4 FINAL for  Lobo, Theadora S [phone]) Microscopic Comment(continued) Breast prognostic profile: [phone] Estrogen receptor: Positive (100%, strong staining intensity). Progesterone receptor: Positive (100%, strong staining intensity. Her 2 neu: No amplification was detected. The ratio was 1.45. Her 2 neu by CISH will be repeated on both tumors and the results reported separately. Ki-67: 13% Non-neoplastic breast: Healing biopsy site. TNM: mpT1c, pN1a Comments: Grossly, there are two foci of grade I invasive ductal carcinoma present in the simple mastectomy. The larger nodule spans 1.7 cm and is histologically identical to the previous core biopsy, [phone] ("left superior"). The second nodule, 0.7 cm, is grade I invasive ductal carcinoma with extracellular mucin. A complete profile will be performed on this second, smaller nodule. (JBK:gt, 12/22/11) Pecola Leisure MD Pathologist, Electronic Signature   RADIOGRAPHIC STUDIES:  No results found.  ASSESSMENT: 79 year old female with:  #1 new diagnosis of stage II invasive ductal carcinoma of the left breast she is status post mastectomy with finding of 2 foci of invasive ductal carcinoma both are ER/PR positive but the smaller focus measuring 0.7 cm is HER-2/neu positive with a ratio over 4.  #2 patient was begun on adjuvant chemotherapy and Herceptin. With chemotherapy consisting of Taxol given weekly with weekly Herceptin. She received her first cycle on 02/08/2012- 10/13.  Patient subsequently could not tolerate the Taxol so therefore this was discontinued.  #3 she was then begun on Herceptin every 3 weeks beginning on  #3 Anemia due to chemotherapy  #4 Neuropathy  PLAN: 1. Proceed with herceptin and continue daily Femara.    2. Gabapentin for her foot pain.    3. Return3 weeks for her next dose of Herceptin.   She knows to call with any questions or concerns before her next appointment.    I spent 25 minutes counseling the  patient face to face. The total time spent in the appointment was 30 minutes.

## 2012-09-08 ENCOUNTER — Telehealth: Payer: Self-pay | Admitting: Medical Oncology

## 2012-09-08 NOTE — Telephone Encounter (Signed)
Pt LVMOM inquiring when next appt is. Returned call to pt and informed her that her appt is scheduled 03/13 with labs at 1145, NP at 1215 and Tx at 1:30. Patient expressed thanks. No further questions at this time.

## 2012-09-15 ENCOUNTER — Telehealth: Payer: Self-pay | Admitting: Oncology

## 2012-09-15 ENCOUNTER — Ambulatory Visit (HOSPITAL_BASED_OUTPATIENT_CLINIC_OR_DEPARTMENT_OTHER): Payer: Medicare Other

## 2012-09-15 ENCOUNTER — Encounter: Payer: Self-pay | Admitting: Adult Health

## 2012-09-15 ENCOUNTER — Other Ambulatory Visit (HOSPITAL_BASED_OUTPATIENT_CLINIC_OR_DEPARTMENT_OTHER): Payer: Medicare Other | Admitting: Lab

## 2012-09-15 ENCOUNTER — Ambulatory Visit (HOSPITAL_BASED_OUTPATIENT_CLINIC_OR_DEPARTMENT_OTHER): Payer: Medicare Other | Admitting: Adult Health

## 2012-09-15 VITALS — BP 156/72 | HR 62 | Temp 97.8°F | Resp 20 | Ht 59.0 in | Wt 164.0 lb

## 2012-09-15 DIAGNOSIS — C50412 Malignant neoplasm of upper-outer quadrant of left female breast: Secondary | ICD-10-CM

## 2012-09-15 DIAGNOSIS — Z79899 Other long term (current) drug therapy: Secondary | ICD-10-CM

## 2012-09-15 DIAGNOSIS — C50919 Malignant neoplasm of unspecified site of unspecified female breast: Secondary | ICD-10-CM

## 2012-09-15 DIAGNOSIS — C50419 Malignant neoplasm of upper-outer quadrant of unspecified female breast: Secondary | ICD-10-CM

## 2012-09-15 DIAGNOSIS — G589 Mononeuropathy, unspecified: Secondary | ICD-10-CM

## 2012-09-15 DIAGNOSIS — Z5112 Encounter for antineoplastic immunotherapy: Secondary | ICD-10-CM

## 2012-09-15 DIAGNOSIS — D6481 Anemia due to antineoplastic chemotherapy: Secondary | ICD-10-CM

## 2012-09-15 DIAGNOSIS — T451X5A Adverse effect of antineoplastic and immunosuppressive drugs, initial encounter: Secondary | ICD-10-CM

## 2012-09-15 LAB — CBC WITH DIFFERENTIAL/PLATELET
Eosinophils Absolute: 0.2 10*3/uL (ref 0.0–0.5)
MONO#: 0.6 10*3/uL (ref 0.1–0.9)
MONO%: 8 % (ref 0.0–14.0)
NEUT#: 4.8 10*3/uL (ref 1.5–6.5)
RBC: 3.95 10*6/uL (ref 3.70–5.45)
RDW: 15 % — ABNORMAL HIGH (ref 11.2–14.5)
WBC: 7.5 10*3/uL (ref 3.9–10.3)
lymph#: 1.8 10*3/uL (ref 0.9–3.3)
nRBC: 0 % (ref 0–0)

## 2012-09-15 LAB — COMPREHENSIVE METABOLIC PANEL (CC13)
ALT: 17 U/L (ref 0–55)
CO2: 26 mEq/L (ref 22–29)
Potassium: 3.6 mEq/L (ref 3.5–5.1)
Sodium: 139 mEq/L (ref 136–145)
Total Bilirubin: 0.31 mg/dL (ref 0.20–1.20)
Total Protein: 6.8 g/dL (ref 6.4–8.3)

## 2012-09-15 MED ORDER — HEPARIN SOD (PORK) LOCK FLUSH 100 UNIT/ML IV SOLN
500.0000 [IU] | Freq: Once | INTRAVENOUS | Status: AC | PRN
  Administered 2012-09-15: 500 [IU]
  Filled 2012-09-15: qty 5

## 2012-09-15 MED ORDER — SODIUM CHLORIDE 0.9 % IJ SOLN
10.0000 mL | INTRAMUSCULAR | Status: DC | PRN
  Administered 2012-09-15: 10 mL
  Filled 2012-09-15: qty 10

## 2012-09-15 MED ORDER — ACETAMINOPHEN 325 MG PO TABS
650.0000 mg | ORAL_TABLET | Freq: Once | ORAL | Status: AC
  Administered 2012-09-15: 650 mg via ORAL

## 2012-09-15 MED ORDER — DIPHENHYDRAMINE HCL 25 MG PO CAPS
50.0000 mg | ORAL_CAPSULE | Freq: Once | ORAL | Status: AC
  Administered 2012-09-15: 50 mg via ORAL

## 2012-09-15 MED ORDER — TRASTUZUMAB CHEMO INJECTION 440 MG
6.0000 mg/kg | Freq: Once | INTRAVENOUS | Status: AC
  Administered 2012-09-15: 462 mg via INTRAVENOUS
  Filled 2012-09-15: qty 22

## 2012-09-15 NOTE — Progress Notes (Signed)
OFFICE PROGRESS NOTE  CC  [name], MD [address] [place] [address] Dr. Emelia Loron Dr. Antony Blackbird  DIAGNOSIS:  79 year old female with new diagnosis of multifocal breast cancer of the left breast. Patient is status post mastectomy with sentinel lymph node biopsy performed on 12/18/2011.   PRIOR THERAPY: 1.Patient has had multiple biopsies performed including endoscopic ultrasound guided biopsies as well as an open biopsy without a definitive diagnosis  #2 patient now with new diagnosis of multifocal breast cancer of the left breast. She underwent a mammogram after she felt a mass in the left breast. The mammogram showed an abnormality. As a biopsy was performed and she was found to have an ace invasive ductal carcinoma. She has now had a mastectomy on 12/18/2011. The final pathology revealed 2 foci of well-differentiated invasive ductal carcinoma one measuring 1.7 cm and the second measuring 0.7 cm there was ductal carcinoma in situ lymphovascular invasion was also identified all surgical margins were negative for carcinoma. Patient went on to also have a left sentinel node biopsy one sentinel node was positive for metastatic disease with focal capsular extension. The tumor was estrogen receptor positive progesterone receptor positive. The largest tumor measuring 1.7 cm was HER-2/neu negative with a Ki-67 of 13%. The smaller tumor showed ER +100% PR receptor 7% proliferation marker 50% and it was HER-2/neu positive with a ratio 4.94.  #3 patient has been seen by radiation oncology and post mastectomy radiation therapy is recommended. At this time she will not get a left axillary lymph node dissection.  #4 patient's tumor is HER-2/neu positive and does she will receive adjuvant chemotherapy and Herceptin based therapy.  #5 patient will proceed with combination chemotherapy consisting of Taxol and Herceptin starting on 02/09/2012. A total of 12 weeks of combination  treatment is planned. Once she completes this she will undergo radiation therapy. After which she will receive Herceptin for one year with 5 years of letrozole 2.5 mg daily.  CURRENT THERAPY: Q 3 week herceptin and Letrozole daily.    INTERVAL HISTORY: Brunilda Eble Cousins 79 y.o. female returns for followup visit today.  She's doing well.  She is feeling well today.  She is here for her q 3 week Herceptin.  She is taking the Letrozole daily and continues to tolerate it well.  She denies fevers, chills, hot flashes, joint pain, nausea, vomiting, shortness of breath, chest pain, palpitations, swelling, PND or any other concerns. A 10 point ROS is neg.   MEDICAL HISTORY: Past Medical History  Diagnosis Date  . Asthma   . Hyperlipidemia     takes Simvastatin daily  . Hypertension     takes Amlodipine and Diovan daily  . Cancer     left breast  . Bronchitis     hx of;last time >31yr ago  . Arthritis     shoulders  . Dysphagia   . H/O hiatal hernia   . GERD (gastroesophageal reflux disease)     takes Prilosec prn  . Urinary urgency   . History of kidney stones     many yrs ago  . Depression     takes Celexa daily  . Insomnia     takes Ambien nightly  . Breast cancer 12/18/11    left breast masectomy=metastatic ca in (1/1) lymph node ,invasive ductal ca,2 foci,,dcis,lymph ovascular invasion identified,surgical resection margins neg for ca,additional tissue=benign skin and subcutaneous tissue  . Breast CA 11/23/11    left breast 3 o'clock bx=high grade  ductal ca in situ w/comedononecrosis and calcification,dcis,lymphovascular invasion present ,ER?PR+positive  . Full dentures   . Hx of radiation therapy 04/26/12 -06/10/12    left breast    ALLERGIES:  has No Known Allergies.  MEDICATIONS:  Current Outpatient Prescriptions  Medication Sig Dispense Refill  . acyclovir (ZOVIRAX) 400 MG tablet Take 2 tablets (800 mg total) by mouth 5 (five) times daily.  70 tablet  0  . amLODipine (NORVASC)  10 MG tablet Take 10 mg by mouth daily.       . citalopram (CELEXA) 20 MG tablet Take 20 mg by mouth daily.      Marland Kitchen DIOVAN HCT 160-12.5 MG per tablet Take 2 tablets by mouth every morning.       . gabapentin (NEURONTIN) 100 MG capsule Take 3 capsules (300 mg total) by mouth at bedtime.  90 capsule  0  . letrozole (FEMARA) 2.5 MG tablet Take 1 tablet (2.5 mg total) by mouth daily.  90 tablet  12  . lidocaine-prilocaine (EMLA) cream Apply topically as needed.  30 g  4  . omeprazole (PRILOSEC) 20 MG capsule Take 20 mg by mouth daily.      Marland Kitchen oxyCODONE (OXY IR/ROXICODONE) 5 MG immediate release tablet Take 1 tablet (5 mg total) by mouth every 4 (four) hours as needed for pain.  90 tablet  0  . simvastatin (ZOCOR) 20 MG tablet Take 20 mg by mouth daily.       Marland Kitchen zolpidem (AMBIEN) 10 MG tablet Take 10 mg by mouth at bedtime.      . potassium chloride SA (K-DUR,KLOR-CON) 20 MEQ tablet Take 1 tablet (20 mEq total) by mouth 2 (two) times daily.  20 tablet  0   No current facility-administered medications for this visit.   Facility-Administered Medications Ordered in Other Visits  Medication Dose Route Frequency Provider Last Rate Last Dose  . sodium chloride 0.9 % injection 10 mL  10 mL Intracatheter PRN Victorino December, MD   10 mL at 03/14/12 1628    SURGICAL HISTORY:  Past Surgical History  Procedure Laterality Date  . Breast biopsy  1998    left  . Total shoulder replacement  2011    left  . Eus  06/04/2011    Procedure: UPPER ENDOSCOPIC ULTRASOUND (EUS) LINEAR;  Surgeon: Rob Bunting, MD;  Location: [facility];  Service: Endoscopy;  Laterality: N/A;  . Exploratory laparotomy       biopsy of intra-abdominal mass  . Bladder tack    . Tubal ligation    . Appendectomy    . Dilation and curettage of uterus    . Esophagogastroduodenoscopy    . Mastectomy w/ sentinel node biopsy  12/18/2011    Procedure: MASTECTOMY WITH SENTINEL LYMPH NODE BIOPSY;  Surgeon: Emelia Loron, MD;  Location: [facility]  OR;  Service: General;  Laterality: Left;  . Portacath placement  01/27/2012    Procedure: INSERTION PORT-A-CATH;  Surgeon: Emelia Loron, MD;  Location: [facility];  Service: General;  Laterality: Right;  PORT PLACEMENT    REVIEW OF SYSTEMS:   General: fatigue (+), night sweats (-), fever (-), pain (-) Lymph: palpable nodes (-) HEENT: vision changes (-), mucositis (-), gum bleeding (-), epistaxis (-) Cardiovascular: chest pain (-), palpitations (-) Pulmonary: shortness of breath (-), dyspnea on exertion (-), cough (-), hemoptysis (-) GI:  Early satiety (-), melena (-), dysphagia (-), nausea/vomiting (-), diarrhea (-) GU: dysuria (-), hematuria (-), incontinence (-) Musculoskeletal: joint swelling (-), joint pain (-),  back pain (-) Neuro: weakness (-), numbness (-), headache (-), confusion (-) Skin: Rash (-), lesions (-), dryness (-) Psych: depression (-), suicidal/homicidal ideation (-), feeling of hopelessness (-)  Health Maintenance  Mammogram: 11/2011 Colonoscopy: Never Bone Density Scan: Never Pap Smear: Years Eye Exam: 2 years ago Vitamin D Level: 1/14 Lipid Panel: 2011   PHYSICAL EXAMINATION:  BP 156/72  Pulse 62  Temp(Src) 97.8 F (36.6 C) (Oral)  Resp 20  Ht 4\' 11"  (1.499 m)  Wt 164 lb (74.39 kg)  BMI 33.11 kg/m2 General: Patient is an elderly appearing female in no acute distress HEENT: PERRLA, sclerae anicteric no conjunctival pallor, MMM Neck: supple, no palpable adenopathy Lungs: clear to auscultation bilaterally, no wheezes, rhonchi, or rales Cardiovascular: regular rate rhythm, S1, S2, systolic murmur, rubs or gallops Abdomen: Soft, non-tender, non-distended, normoactive bowel sounds, no HSM Extremities: warm and well perfused, no clubbing, cyanosis, or edema Skin: No rash or lesions, previous shingles and superinfection have healed. Neuro: Non-focal Breast: Left mastectomy no nodularity, or local sign of recurrence.  Right breast is  without masses ECOG PERFORMANCE STATUS: 1 - Symptomatic but completely ambulatory  LABORATORY DATA: Lab Results  Component Value Date   WBC 7.5 09/15/2012   HGB 10.8* 09/15/2012   HCT 33.1* 09/15/2012   MCV 83.8 09/15/2012   PLT 226 09/15/2012      Chemistry      Component Value Date/Time   NA 143 08/25/2012 1013   NA 139 02/22/2012 1148   K 3.7 08/25/2012 1013   K 4.0 02/22/2012 1148   CL 108* 08/25/2012 1013   CL 106 02/22/2012 1148   CO2 27 08/25/2012 1013   CO2 27 02/22/2012 1148   BUN 13.7 08/25/2012 1013   BUN 15 02/22/2012 1148   CREATININE 0.6 08/25/2012 1013   CREATININE 0.52 02/22/2012 1148      Component Value Date/Time   CALCIUM 9.3 08/25/2012 1013   CALCIUM 8.6 02/22/2012 1148   ALKPHOS 81 08/25/2012 1013   ALKPHOS 59 02/22/2012 1148   AST 15 08/25/2012 1013   AST 21 02/22/2012 1148   ALT 16 08/25/2012 1013   ALT 30 02/22/2012 1148   BILITOT 0.29 08/25/2012 1013   BILITOT 0.2* 02/22/2012 1148    ADDITIONAL INFORMATION: 2. PROGNOSTIC INDICATORS - ACIS Results IMMUNOHISTOCHEMICAL AND MORPHOMETRIC ANALYSIS BY THE AUTOMATED CELLULAR IMAGING SYSTEM (ACIS) THE SMALLER TUMOR SHOWS THE FOLLOWING BREAST PROGNOSTIC PROFILE: Estrogen Receptor (Negative, <1%): 100%,POSITIVE, STRONG STAINING INTENSITY Progesterone Receptor (Negative, <1%): 7%,POSITIVE, STRONG STAINING INTENSITY Proliferation Marker Ki67 by M IB-1 (Low<20%): 50% All controls stained appropriately Abigail Miyamoto MD Pathologist, Electronic Signature ( Signed 12/25/2011) 2. CHROMOGENIC IN-SITU HYBRIDIZATION Interpretation: 0.7 CM SMALLER TUMOR: HER2/NEU BY CISH - SHOWS AMPLIFICATION BY CISH ANALYSIS. THE RATIO OF HER2: CEP 17 SIGNALS WAS 4.94. 1.7 CM LEFT SUPERIOR TUMOR: HER-2/NEU BY CISH - NO AMPLIFICATION OF HER-2 DETECTED. THE RATIO OF HER-2: CEP 17 SIGNALS WAS 1.14. Reference range: Ratio: HER2:CEP17 < 1.8 - gene amplification not observed Ratio: HER2:CEP 17 1.8-2.2 - equivocal result Ratio: HER2:CEP17 > 2.2 -  gene amplification observed 1 of 4 FINAL for Steuart, Nekayla S [phone]) ADDITIONAL INFORMATION:(continued) Comment: The cancer center was notified on 12-24-2011. Abigail Miyamoto MD Pathologist, Electronic Signature ( Signed 12/24/2011) FINAL DIAGNOSIS Diagnosis 1. Lymph node, sentinel, biopsy, Left - METASTATIC CARCINOMA IN 1 OF 1 LYMPH NODE WITH FOCAL CAPSULAR EXTENSION (1/1). 2. Breast, simple mastectomy, Left - INVASIVE DUCTAL CARCINOMA, TWO FOCI, WELL DIFFERENTIATED, SPANNING 1.7 AND 0.7 CM. -  DUCTAL CARCINOMA IN SITU. - LYMPHOVASCULAR INVASION IS IDENTIFIED. - THE SURGICAL RESECTION MARGINS ARE NEGATIVE FOR CARCINOMA. - SEE ONCOLOGY TABLE BELOW. 3. Breast, biopsy, left, additional tissue - BENIGN SKIN AND SUBCUTANEOUS TISSUE. - THERE IS NO EVIDENCE OF MALIGNANCY. Microscopic Comment 2. BREAST, INVASIVE TUMOR, WITH LYMPH NODE SAMPLING Specimen, including laterality: Left breast. Procedure: Simple mastectomy Grade: Both are I Tubule formation: 1 and 1 Nuclear pleomorphism: 1 and 2 Mitotic:1 and 1 Tumor size (gross measurement): 1.7 and 0.7 cm Margins: Negative for carcinoma Invasive, distance to closest margin: 2.0 cm to the deep margin (gross measurement) In-situ, distance to closest margin: 2.0 cm from the deep margin (gross measurement). Lymphovascular invasion: Present. Ductal carcinoma in situ: Present. Grade: Intermediate grade. Extensive intraductal component: No. Lobular neoplasia: Not identified. Tumor focality: Two foci. Treatment effect: N/A Extent of tumor: Confined to breast parenchyma Lymph nodes: # examined: 1 Lymph nodes with metastasis: 1 Isolated tumor cells (< 0.2 mm): 0 Micrometastasis: (> 0.2 mm and < 2.0 mm): 0 Macrometastasis: (> 2.0 mm): 1 Extracapsular extension: Present, focal. 2 of 4 FINAL for Skufca, Stacyann S [phone]) Microscopic Comment(continued) Breast prognostic profile: [phone] Estrogen receptor: Positive (100%,  strong staining intensity). Progesterone receptor: Positive (100%, strong staining intensity. Her 2 neu: No amplification was detected. The ratio was 1.45. Her 2 neu by CISH will be repeated on both tumors and the results reported separately. Ki-67: 13% Non-neoplastic breast: Healing biopsy site. TNM: mpT1c, pN1a Comments: Grossly, there are two foci of grade I invasive ductal carcinoma present in the simple mastectomy. The larger nodule spans 1.7 cm and is histologically identical to the previous core biopsy, [phone] ("left superior"). The second nodule, 0.7 cm, is grade I invasive ductal carcinoma with extracellular mucin. A complete profile will be performed on this second, smaller nodule. (JBK:gt, 12/22/11) Pecola Leisure MD Pathologist, Electronic Signature   RADIOGRAPHIC STUDIES:  No results found.  ASSESSMENT: 79 year old female with:  #1 new diagnosis of stage II invasive ductal carcinoma of the left breast she is status post mastectomy with finding of 2 foci of invasive ductal carcinoma both are ER/PR positive but the smaller focus measuring 0.7 cm is HER-2/neu positive with a ratio over 4.  #2 patient was begun on adjuvant chemotherapy and Herceptin. With chemotherapy consisting of Taxol given weekly with weekly Herceptin. She received her first cycle on 02/08/2012- 10/13.  Patient subsequently could not tolerate the Taxol so therefore this was discontinued.  #3 she was then begun on Herceptin every 3 weeks beginning on  #3 Anemia due to chemotherapy  #4 Neuropathy  PLAN: 1. Proceed with herceptin and continue daily Femara.  I have requested f/u with Dr. Gala Romney in April.    2. Return3 weeks for her next dose of Herceptin.   She knows to call with any questions or concerns before her next appointment.    I spent 15 minutes counseling the patient face to face. The total time spent in the appointment was 30 minutes.  Cherie Ouch Lyn Hollingshead, NP Medical  Oncology [facility] Phone: [phone]

## 2012-09-15 NOTE — Telephone Encounter (Signed)
Per 3/13 pof moved appts to PM. gv pt new appt schedule for April thru May.

## 2012-09-15 NOTE — Patient Instructions (Signed)
Doing well.  Proceed with Herceptin. Continue daily Letrozole.  Please call us if you have any questions or concerns.

## 2012-09-15 NOTE — Patient Instructions (Addendum)
[facility] Discharge Instructions for Patients Receiving Chemotherapy  Today you received the following chemotherapy agents Herceptin.  To help prevent nausea and vomiting after your treatment, we encourage you to take your nausea medication as prescribed.    If you develop nausea and vomiting that is not controlled by your nausea medication, call the clinic. If it is after clinic hours your family physician or the after hours number for the clinic or go to the Emergency Department.   BELOW ARE SYMPTOMS THAT SHOULD BE REPORTED IMMEDIATELY:  *FEVER GREATER THAN 100.5 F  *CHILLS WITH OR WITHOUT FEVER  NAUSEA AND VOMITING THAT IS NOT CONTROLLED WITH YOUR NAUSEA MEDICATION  *UNUSUAL SHORTNESS OF BREATH  *UNUSUAL BRUISING OR BLEEDING  TENDERNESS IN MOUTH AND THROAT WITH OR WITHOUT PRESENCE OF ULCERS  *URINARY PROBLEMS  *BOWEL PROBLEMS  UNUSUAL RASH Items with * indicate a potential emergency and should be followed up as soon as possible.   Please let the nurse know about any problems that you may have experienced. Feel free to call the clinic you have any questions or concerns. The clinic phone number is [phone].   I have been informed and understand all the instructions given to me. I know to contact the clinic, my physician, or go to the Emergency Department if any problems should occur. I do not have any questions at this time, but understand that I may call the clinic during office hours   should I have any questions or need assistance in obtaining follow up care.    __________________________________________  _____________  __________ Signature of Patient or Authorized Representative            Date                   Time    __________________________________________ Nurse's Signature    

## 2012-09-16 ENCOUNTER — Telehealth: Payer: Self-pay | Admitting: *Deleted

## 2012-09-16 NOTE — Telephone Encounter (Signed)
sw pt appt for bensimhon was given for 10/17/2012 @ 11am.

## 2012-09-19 ENCOUNTER — Telehealth: Payer: Self-pay | Admitting: *Deleted

## 2012-09-19 NOTE — Telephone Encounter (Signed)
sw pt appts d/t was given for 10/06/12.

## 2012-09-21 ENCOUNTER — Other Ambulatory Visit: Payer: Self-pay | Admitting: Oncology

## 2012-10-06 ENCOUNTER — Ambulatory Visit (HOSPITAL_BASED_OUTPATIENT_CLINIC_OR_DEPARTMENT_OTHER): Payer: Medicare Other | Admitting: Adult Health

## 2012-10-06 ENCOUNTER — Encounter: Payer: Self-pay | Admitting: Adult Health

## 2012-10-06 ENCOUNTER — Ambulatory Visit: Payer: Medicare Other

## 2012-10-06 ENCOUNTER — Ambulatory Visit: Payer: Medicare Other | Admitting: Adult Health

## 2012-10-06 ENCOUNTER — Ambulatory Visit (HOSPITAL_BASED_OUTPATIENT_CLINIC_OR_DEPARTMENT_OTHER): Payer: Medicare Other

## 2012-10-06 ENCOUNTER — Other Ambulatory Visit (HOSPITAL_BASED_OUTPATIENT_CLINIC_OR_DEPARTMENT_OTHER): Payer: Medicare Other | Admitting: Lab

## 2012-10-06 ENCOUNTER — Other Ambulatory Visit: Payer: Medicare Other | Admitting: Lab

## 2012-10-06 VITALS — BP 166/71 | HR 97 | Temp 97.0°F | Resp 20 | Ht 59.0 in | Wt 164.2 lb

## 2012-10-06 DIAGNOSIS — C50912 Malignant neoplasm of unspecified site of left female breast: Secondary | ICD-10-CM

## 2012-10-06 DIAGNOSIS — C50919 Malignant neoplasm of unspecified site of unspecified female breast: Secondary | ICD-10-CM

## 2012-10-06 DIAGNOSIS — Z780 Asymptomatic menopausal state: Secondary | ICD-10-CM

## 2012-10-06 DIAGNOSIS — Z5112 Encounter for antineoplastic immunotherapy: Secondary | ICD-10-CM

## 2012-10-06 DIAGNOSIS — Z5111 Encounter for antineoplastic chemotherapy: Secondary | ICD-10-CM

## 2012-10-06 DIAGNOSIS — Z79811 Long term (current) use of aromatase inhibitors: Secondary | ICD-10-CM

## 2012-10-06 DIAGNOSIS — Z9010 Acquired absence of unspecified breast and nipple: Secondary | ICD-10-CM

## 2012-10-06 DIAGNOSIS — C50419 Malignant neoplasm of upper-outer quadrant of unspecified female breast: Secondary | ICD-10-CM

## 2012-10-06 DIAGNOSIS — Z170 Estrogen receptor positive status [ER+]: Secondary | ICD-10-CM

## 2012-10-06 DIAGNOSIS — Z79899 Other long term (current) drug therapy: Secondary | ICD-10-CM

## 2012-10-06 LAB — COMPREHENSIVE METABOLIC PANEL (CC13)
ALT: 17 U/L (ref 0–55)
CO2: 24 mEq/L (ref 22–29)
Calcium: 9.5 mg/dL (ref 8.4–10.4)
Chloride: 106 mEq/L (ref 98–107)
Potassium: 3.8 mEq/L (ref 3.5–5.1)
Sodium: 139 mEq/L (ref 136–145)
Total Protein: 6.9 g/dL (ref 6.4–8.3)

## 2012-10-06 LAB — CBC WITH DIFFERENTIAL/PLATELET
BASO%: 0.2 % (ref 0.0–2.0)
LYMPH%: 20.3 % (ref 14.0–49.7)
MCHC: 32.5 g/dL (ref 31.5–36.0)
MCV: 83.8 fL (ref 79.5–101.0)
MONO#: 0.8 10*3/uL (ref 0.1–0.9)
MONO%: 8.1 % (ref 0.0–14.0)
Platelets: 223 10*3/uL (ref 145–400)
RBC: 3.82 10*6/uL (ref 3.70–5.45)
WBC: 9.4 10*3/uL (ref 3.9–10.3)

## 2012-10-06 MED ORDER — HEPARIN SOD (PORK) LOCK FLUSH 100 UNIT/ML IV SOLN
500.0000 [IU] | Freq: Once | INTRAVENOUS | Status: DC | PRN
  Filled 2012-10-06: qty 5

## 2012-10-06 MED ORDER — SODIUM CHLORIDE 0.9 % IV SOLN
Freq: Once | INTRAVENOUS | Status: AC
Start: 2012-10-06 — End: 2012-10-06
  Administered 2012-10-06: 16:00:00 via INTRAVENOUS

## 2012-10-06 MED ORDER — SODIUM CHLORIDE 0.9 % IJ SOLN
10.0000 mL | INTRAMUSCULAR | Status: DC | PRN
  Filled 2012-10-06: qty 10

## 2012-10-06 MED ORDER — ACETAMINOPHEN 325 MG PO TABS
650.0000 mg | ORAL_TABLET | Freq: Once | ORAL | Status: AC
  Administered 2012-10-06: 650 mg via ORAL

## 2012-10-06 MED ORDER — TRASTUZUMAB CHEMO INJECTION 440 MG
6.0000 mg/kg | Freq: Once | INTRAVENOUS | Status: AC
  Administered 2012-10-06: 462 mg via INTRAVENOUS
  Filled 2012-10-06: qty 22

## 2012-10-06 MED ORDER — DIPHENHYDRAMINE HCL 25 MG PO CAPS
50.0000 mg | ORAL_CAPSULE | Freq: Once | ORAL | Status: AC
  Administered 2012-10-06: 50 mg via ORAL

## 2012-10-06 NOTE — Progress Notes (Signed)
OFFICE PROGRESS NOTE  CC  [name], MD [address] [place] [address] Dr. Emelia Loron Dr. Antony Blackbird  DIAGNOSIS:  79 year old female with new diagnosis of multifocal breast cancer of the left breast. Patient is status post mastectomy with sentinel lymph node biopsy performed on 12/18/2011.   PRIOR THERAPY: 1.Patient has had multiple biopsies performed including endoscopic ultrasound guided biopsies as well as an open biopsy without a definitive diagnosis  #2 patient now with new diagnosis of multifocal breast cancer of the left breast. She underwent a mammogram after she felt a mass in the left breast. The mammogram showed an abnormality. As a biopsy was performed and she was found to have an ace invasive ductal carcinoma. She has now had a mastectomy on 12/18/2011. The final pathology revealed 2 foci of well-differentiated invasive ductal carcinoma one measuring 1.7 cm and the second measuring 0.7 cm there was ductal carcinoma in situ lymphovascular invasion was also identified all surgical margins were negative for carcinoma. Patient went on to also have a left sentinel node biopsy one sentinel node was positive for metastatic disease with focal capsular extension. The tumor was estrogen receptor positive progesterone receptor positive. The largest tumor measuring 1.7 cm was HER-2/neu negative with a Ki-67 of 13%. The smaller tumor showed ER +100% PR receptor 7% proliferation marker 50% and it was HER-2/neu positive with a ratio 4.94.  #3 patient has been seen by radiation oncology and post mastectomy radiation therapy is recommended. At this time she will not get a left axillary lymph node dissection.  #4 patient's tumor is HER-2/neu positive and does she will receive adjuvant chemotherapy and Herceptin based therapy.  #5 patient will proceed with combination chemotherapy consisting of Taxol and Herceptin starting on 02/09/2012. A total of 12 weeks of combination  treatment is planned. Once she completes this she will undergo radiation therapy. After which she will receive Herceptin for one year with 5 years of letrozole 2.5 mg daily.  CURRENT THERAPY: Q 3 week herceptin and Letrozole daily.    INTERVAL HISTORY: Peighton Mehra Freiberger 79 y.o. female returns for followup visit today.  She's doing well.  She is feeling well today.  She is here for her q 3 week Herceptin.  She has an appt with Dr. Gala Romney on 10/17/12.  She is taking the Letrozole daily and continues to tolerate it well.  She denies fevers, chills, hot flashes, joint pain, nausea, vomiting, shortness of breath, chest pain, palpitations, swelling, PND or any other concerns. A 10 point ROS is neg.   MEDICAL HISTORY: Past Medical History  Diagnosis Date  . Asthma   . Hyperlipidemia     takes Simvastatin daily  . Hypertension     takes Amlodipine and Diovan daily  . Cancer     left breast  . Bronchitis     hx of;last time >5yr ago  . Arthritis     shoulders  . Dysphagia   . H/O hiatal hernia   . GERD (gastroesophageal reflux disease)     takes Prilosec prn  . Urinary urgency   . History of kidney stones     many yrs ago  . Depression     takes Celexa daily  . Insomnia     takes Ambien nightly  . Breast cancer 12/18/11    left breast masectomy=metastatic ca in (1/1) lymph node ,invasive ductal ca,2 foci,,dcis,lymph ovascular invasion identified,surgical resection margins neg for ca,additional tissue=benign skin and subcutaneous tissue  . Breast CA  11/23/11    left breast 3 o'clock bx=high grade ductal ca in situ w/comedononecrosis and calcification,dcis,lymphovascular invasion present ,ER?PR+positive  . Full dentures   . Hx of radiation therapy 04/26/12 -06/10/12    left breast    ALLERGIES:  has No Known Allergies.  MEDICATIONS:  Current Outpatient Prescriptions  Medication Sig Dispense Refill  . acyclovir (ZOVIRAX) 400 MG tablet Take 2 tablets (800 mg total) by mouth 5 (five)  times daily.  70 tablet  0  . amLODipine (NORVASC) 10 MG tablet Take 10 mg by mouth daily.       . citalopram (CELEXA) 20 MG tablet Take 20 mg by mouth daily.      Marland Kitchen DIOVAN HCT 160-12.5 MG per tablet Take 2 tablets by mouth every morning.       . gabapentin (NEURONTIN) 100 MG capsule TAKE 3 CAPSULES AT BEDTIME  90 capsule  0  . letrozole (FEMARA) 2.5 MG tablet Take 1 tablet (2.5 mg total) by mouth daily.  90 tablet  12  . lidocaine-prilocaine (EMLA) cream Apply topically as needed.  30 g  4  . omeprazole (PRILOSEC) 20 MG capsule Take 20 mg by mouth daily.      Marland Kitchen oxyCODONE (OXY IR/ROXICODONE) 5 MG immediate release tablet Take 1 tablet (5 mg total) by mouth every 4 (four) hours as needed for pain.  90 tablet  0  . potassium chloride SA (K-DUR,KLOR-CON) 20 MEQ tablet Take 1 tablet (20 mEq total) by mouth 2 (two) times daily.  20 tablet  0  . simvastatin (ZOCOR) 20 MG tablet Take 20 mg by mouth daily.       Marland Kitchen zolpidem (AMBIEN) 10 MG tablet Take 10 mg by mouth at bedtime.       No current facility-administered medications for this visit.   Facility-Administered Medications Ordered in Other Visits  Medication Dose Route Frequency Provider Last Rate Last Dose  . sodium chloride 0.9 % injection 10 mL  10 mL Intracatheter PRN Victorino December, MD   10 mL at 03/14/12 1628    SURGICAL HISTORY:  Past Surgical History  Procedure Laterality Date  . Breast biopsy  1998    left  . Total shoulder replacement  2011    left  . Eus  06/04/2011    Procedure: UPPER ENDOSCOPIC ULTRASOUND (EUS) LINEAR;  Surgeon: Rob Bunting, MD;  Location: [facility];  Service: Endoscopy;  Laterality: N/A;  . Exploratory laparotomy       biopsy of intra-abdominal mass  . Bladder tack    . Tubal ligation    . Appendectomy    . Dilation and curettage of uterus    . Esophagogastroduodenoscopy    . Mastectomy w/ sentinel node biopsy  12/18/2011    Procedure: MASTECTOMY WITH SENTINEL LYMPH NODE BIOPSY;  Surgeon: Emelia Loron, MD;  Location: [facility] OR;  Service: General;  Laterality: Left;  . Portacath placement  01/27/2012    Procedure: INSERTION PORT-A-CATH;  Surgeon: Emelia Loron, MD;  Location: [facility];  Service: General;  Laterality: Right;  PORT PLACEMENT    REVIEW OF SYSTEMS:   General: fatigue (+), night sweats (-), fever (-), pain (-) Lymph: palpable nodes (-) HEENT: vision changes (-), mucositis (-), gum bleeding (-), epistaxis (-) Cardiovascular: chest pain (-), palpitations (-) Pulmonary: shortness of breath (-), dyspnea on exertion (-), cough (-), hemoptysis (-) GI:  Early satiety (-), melena (-), dysphagia (-), nausea/vomiting (-), diarrhea (-) GU: dysuria (-), hematuria (-), incontinence (-) Musculoskeletal:  joint swelling (-), joint pain (-), back pain (-) Neuro: weakness (-), numbness (-), headache (-), confusion (-) Skin: Rash (-), lesions (-), dryness (-) Psych: depression (-), suicidal/homicidal ideation (-), feeling of hopelessness (-)  Health Maintenance  Mammogram: 11/2011 Colonoscopy: Never Bone Density Scan: Never Pap Smear: Years Eye Exam: 2 years ago Vitamin D Level: 1/14 Lipid Panel: 2011   PHYSICAL EXAMINATION:  BP 166/71  Pulse 97  Temp(Src) 97 F (36.1 C) (Oral)  Resp 20  Ht 4\' 11"  (1.499 m)  Wt 164 lb 3.2 oz (74.481 kg)  BMI 33.15 kg/m2 General: Patient is an elderly appearing female in no acute distress HEENT: PERRLA, sclerae anicteric no conjunctival pallor, MMM Neck: supple, no palpable adenopathy Lungs: clear to auscultation bilaterally, no wheezes, rhonchi, or rales Cardiovascular: regular rate rhythm, S1, S2, systolic murmur, rubs or gallops Abdomen: Soft, non-tender, non-distended, normoactive bowel sounds, no HSM Extremities: warm and well perfused, no clubbing, cyanosis, or edema Skin: No rash or lesions, previous shingles and superinfection have healed. Neuro: Non-focal Breast: Left mastectomy no nodularity, or local sign  of recurrence.  Right breast is without masses ECOG PERFORMANCE STATUS: 1 - Symptomatic but completely ambulatory  LABORATORY DATA: Lab Results  Component Value Date   WBC 9.4 10/06/2012   HGB 10.4* 10/06/2012   HCT 32.0* 10/06/2012   MCV 83.8 10/06/2012   PLT 223 10/06/2012      Chemistry      Component Value Date/Time   NA 139 09/15/2012 1103   NA 139 02/22/2012 1148   K 3.6 09/15/2012 1103   K 4.0 02/22/2012 1148   CL 104 09/15/2012 1103   CL 106 02/22/2012 1148   CO2 26 09/15/2012 1103   CO2 27 02/22/2012 1148   BUN 16.1 09/15/2012 1103   BUN 15 02/22/2012 1148   CREATININE 0.6 09/15/2012 1103   CREATININE 0.52 02/22/2012 1148      Component Value Date/Time   CALCIUM 9.2 09/15/2012 1103   CALCIUM 8.6 02/22/2012 1148   ALKPHOS 81 09/15/2012 1103   ALKPHOS 59 02/22/2012 1148   AST 17 09/15/2012 1103   AST 21 02/22/2012 1148   ALT 17 09/15/2012 1103   ALT 30 02/22/2012 1148   BILITOT 0.31 09/15/2012 1103   BILITOT 0.2* 02/22/2012 1148    ADDITIONAL INFORMATION: 2. PROGNOSTIC INDICATORS - ACIS Results IMMUNOHISTOCHEMICAL AND MORPHOMETRIC ANALYSIS BY THE AUTOMATED CELLULAR IMAGING SYSTEM (ACIS) THE SMALLER TUMOR SHOWS THE FOLLOWING BREAST PROGNOSTIC PROFILE: Estrogen Receptor (Negative, <1%): 100%,POSITIVE, STRONG STAINING INTENSITY Progesterone Receptor (Negative, <1%): 7%,POSITIVE, STRONG STAINING INTENSITY Proliferation Marker Ki67 by M IB-1 (Low<20%): 50% All controls stained appropriately Abigail Miyamoto MD Pathologist, Electronic Signature ( Signed 12/25/2011) 2. CHROMOGENIC IN-SITU HYBRIDIZATION Interpretation: 0.7 CM SMALLER TUMOR: HER2/NEU BY CISH - SHOWS AMPLIFICATION BY CISH ANALYSIS. THE RATIO OF HER2: CEP 17 SIGNALS WAS 4.94. 1.7 CM LEFT SUPERIOR TUMOR: HER-2/NEU BY CISH - NO AMPLIFICATION OF HER-2 DETECTED. THE RATIO OF HER-2: CEP 17 SIGNALS WAS 1.14. Reference range: Ratio: HER2:CEP17 < 1.8 - gene amplification not observed Ratio: HER2:CEP 17 1.8-2.2 - equivocal  result Ratio: HER2:CEP17 > 2.2 - gene amplification observed 1 of 4 FINAL for Wesche, Joscelin S [phone]) ADDITIONAL INFORMATION:(continued) Comment: The cancer center was notified on 12-24-2011. Abigail Miyamoto MD Pathologist, Electronic Signature ( Signed 12/24/2011) FINAL DIAGNOSIS Diagnosis 1. Lymph node, sentinel, biopsy, Left - METASTATIC CARCINOMA IN 1 OF 1 LYMPH NODE WITH FOCAL CAPSULAR EXTENSION (1/1). 2. Breast, simple mastectomy, Left - INVASIVE DUCTAL CARCINOMA, TWO FOCI,  WELL DIFFERENTIATED, SPANNING 1.7 AND 0.7 CM. - DUCTAL CARCINOMA IN SITU. - LYMPHOVASCULAR INVASION IS IDENTIFIED. - THE SURGICAL RESECTION MARGINS ARE NEGATIVE FOR CARCINOMA. - SEE ONCOLOGY TABLE BELOW. 3. Breast, biopsy, left, additional tissue - BENIGN SKIN AND SUBCUTANEOUS TISSUE. - THERE IS NO EVIDENCE OF MALIGNANCY. Microscopic Comment 2. BREAST, INVASIVE TUMOR, WITH LYMPH NODE SAMPLING Specimen, including laterality: Left breast. Procedure: Simple mastectomy Grade: Both are I Tubule formation: 1 and 1 Nuclear pleomorphism: 1 and 2 Mitotic:1 and 1 Tumor size (gross measurement): 1.7 and 0.7 cm Margins: Negative for carcinoma Invasive, distance to closest margin: 2.0 cm to the deep margin (gross measurement) In-situ, distance to closest margin: 2.0 cm from the deep margin (gross measurement). Lymphovascular invasion: Present. Ductal carcinoma in situ: Present. Grade: Intermediate grade. Extensive intraductal component: No. Lobular neoplasia: Not identified. Tumor focality: Two foci. Treatment effect: N/A Extent of tumor: Confined to breast parenchyma Lymph nodes: # examined: 1 Lymph nodes with metastasis: 1 Isolated tumor cells (< 0.2 mm): 0 Micrometastasis: (> 0.2 mm and < 2.0 mm): 0 Macrometastasis: (> 2.0 mm): 1 Extracapsular extension: Present, focal. 2 of 4 FINAL for Cheek, Niaya S [phone]) Microscopic Comment(continued) Breast prognostic profile:  [phone] Estrogen receptor: Positive (100%, strong staining intensity). Progesterone receptor: Positive (100%, strong staining intensity. Her 2 neu: No amplification was detected. The ratio was 1.45. Her 2 neu by CISH will be repeated on both tumors and the results reported separately. Ki-67: 13% Non-neoplastic breast: Healing biopsy site. TNM: mpT1c, pN1a Comments: Grossly, there are two foci of grade I invasive ductal carcinoma present in the simple mastectomy. The larger nodule spans 1.7 cm and is histologically identical to the previous core biopsy, [phone] ("left superior"). The second nodule, 0.7 cm, is grade I invasive ductal carcinoma with extracellular mucin. A complete profile will be performed on this second, smaller nodule. (JBK:gt, 12/22/11) Pecola Leisure MD Pathologist, Electronic Signature   RADIOGRAPHIC STUDIES:  No results found.  ASSESSMENT: 79 year old female with:  #1 new diagnosis of stage II invasive ductal carcinoma of the left breast she is status post mastectomy with finding of 2 foci of invasive ductal carcinoma both are ER/PR positive but the smaller focus measuring 0.7 cm is HER-2/neu positive with a ratio over 4.  #2 patient was begun on adjuvant chemotherapy and Herceptin. With chemotherapy consisting of Taxol given weekly with weekly Herceptin. She received her first cycle on 02/08/2012- 10/13.  Patient subsequently could not tolerate the Taxol so therefore this was discontinued.  #3 she was then begun on Herceptin every 3 weeks beginning on  #3 Anemia due to chemotherapy  #4 Neuropathy  PLAN: 1. Proceed with herceptin and continue daily Femara.  I have requested a bone density scan.  She will see Dr. Gala Romney on 10/17/12.  2. Return in weeks for her next dose of Herceptin.   She knows to call with any questions or concerns before her next appointment.    I spent 15 minutes counseling the patient face to face. The total time spent in the  appointment was 30 minutes.  Cherie Ouch Lyn Hollingshead, NP Medical Oncology [facility] Phone: [phone]

## 2012-10-06 NOTE — Patient Instructions (Addendum)

## 2012-10-06 NOTE — Patient Instructions (Addendum)
Doing well.  Proceed with Herceptin.  Please call us if you have any questions or concerns.    

## 2012-10-07 ENCOUNTER — Telehealth: Payer: Self-pay | Admitting: Oncology

## 2012-10-07 NOTE — Telephone Encounter (Signed)
S/W THE PT AND SHE IS AWARE OF HER BONE DENSITY APPT AT THE [facility] IN April.

## 2012-10-15 ENCOUNTER — Other Ambulatory Visit: Payer: Self-pay | Admitting: Oncology

## 2012-10-17 ENCOUNTER — Ambulatory Visit (HOSPITAL_COMMUNITY)
Admission: RE | Admit: 2012-10-17 | Discharge: 2012-10-17 | Disposition: A | Discharge status: Home / Self Care | Payer: Medicare Other | Source: Ambulatory Visit | Attending: Family Medicine | Admitting: Family Medicine

## 2012-10-17 ENCOUNTER — Ambulatory Visit (HOSPITAL_COMMUNITY)
Admission: RE | Admit: 2012-10-17 | Discharge: 2012-10-17 | Disposition: A | Discharge status: Home / Self Care | Payer: Medicare Other | Source: Ambulatory Visit | Attending: Internal Medicine | Admitting: Internal Medicine

## 2012-10-17 VITALS — BP 164/62 | HR 96 | Wt 161.1 lb

## 2012-10-17 DIAGNOSIS — I10 Essential (primary) hypertension: Secondary | ICD-10-CM

## 2012-10-17 DIAGNOSIS — Z79899 Other long term (current) drug therapy: Secondary | ICD-10-CM | POA: Insufficient documentation

## 2012-10-17 DIAGNOSIS — C50412 Malignant neoplasm of upper-outer quadrant of left female breast: Secondary | ICD-10-CM

## 2012-10-17 DIAGNOSIS — R0989 Other specified symptoms and signs involving the circulatory and respiratory systems: Secondary | ICD-10-CM | POA: Insufficient documentation

## 2012-10-17 DIAGNOSIS — F3289 Other specified depressive episodes: Secondary | ICD-10-CM | POA: Insufficient documentation

## 2012-10-17 DIAGNOSIS — Z170 Estrogen receptor positive status [ER+]: Secondary | ICD-10-CM | POA: Insufficient documentation

## 2012-10-17 DIAGNOSIS — I350 Nonrheumatic aortic (valve) stenosis: Secondary | ICD-10-CM

## 2012-10-17 DIAGNOSIS — R1310 Dysphagia, unspecified: Secondary | ICD-10-CM | POA: Insufficient documentation

## 2012-10-17 DIAGNOSIS — R0609 Other forms of dyspnea: Secondary | ICD-10-CM | POA: Insufficient documentation

## 2012-10-17 DIAGNOSIS — Z87442 Personal history of urinary calculi: Secondary | ICD-10-CM | POA: Insufficient documentation

## 2012-10-17 DIAGNOSIS — E785 Hyperlipidemia, unspecified: Secondary | ICD-10-CM | POA: Insufficient documentation

## 2012-10-17 DIAGNOSIS — M1710 Unilateral primary osteoarthritis, unspecified knee: Secondary | ICD-10-CM | POA: Insufficient documentation

## 2012-10-17 DIAGNOSIS — C50419 Malignant neoplasm of upper-outer quadrant of unspecified female breast: Secondary | ICD-10-CM

## 2012-10-17 DIAGNOSIS — J45909 Unspecified asthma, uncomplicated: Secondary | ICD-10-CM | POA: Insufficient documentation

## 2012-10-17 DIAGNOSIS — F329 Major depressive disorder, single episode, unspecified: Secondary | ICD-10-CM | POA: Insufficient documentation

## 2012-10-17 DIAGNOSIS — C50919 Malignant neoplasm of unspecified site of unspecified female breast: Secondary | ICD-10-CM

## 2012-10-17 DIAGNOSIS — Z9010 Acquired absence of unspecified breast and nipple: Secondary | ICD-10-CM | POA: Insufficient documentation

## 2012-10-17 DIAGNOSIS — K219 Gastro-esophageal reflux disease without esophagitis: Secondary | ICD-10-CM | POA: Insufficient documentation

## 2012-10-17 DIAGNOSIS — I359 Nonrheumatic aortic valve disorder, unspecified: Secondary | ICD-10-CM

## 2012-10-17 DIAGNOSIS — Z09 Encounter for follow-up examination after completed treatment for conditions other than malignant neoplasm: Secondary | ICD-10-CM

## 2012-10-17 MED ORDER — SPIRONOLACTONE 25 MG PO TABS: 12.5000 mg | ORAL_TABLET | Freq: Every day | ORAL | Status: DC

## 2012-10-17 NOTE — Patient Instructions (Addendum)
Follow up in 3 months with an ECHO. We will call you to schedule the appointment.   Take spironolactone 12.5 mg daily

## 2012-10-17 NOTE — Assessment & Plan Note (Addendum)
BP consistently elevated. She has diastolic dysfunction with elevated L-sided pressures on echo. Will start spiro 12.5 daily. Check BMET 1 week at [facility].

## 2012-10-17 NOTE — Addendum Note (Signed)
Encounter addended by: Dolores Patty, MD on: 10/17/2012 12:53 PM<BR>     Documentation filed: Follow-up Section, LOS Section, Problem List, Notes Section

## 2012-10-17 NOTE — Progress Notes (Signed)
  Echocardiogram 2D Echocardiogram has been performed.  Brandy [name] Brandy [name] 10/17/2012, 10:32 AM

## 2012-10-17 NOTE — Assessment & Plan Note (Signed)
Patient seen and examined with Tonye Becket, NP. We discussed all aspects of the encounter. I agree with the assessment as stated above. My thoughts below.  I reviewed echos personally. EF and Doppler parameters stable. No HF on exam. Continue Herceptin.

## 2012-10-17 NOTE — Assessment & Plan Note (Signed)
Mild. Continue to follow.

## 2012-10-17 NOTE — Progress Notes (Addendum)
Patient ID: REE ALCALDE, female   DOB: 02/07/1933, 79 y.o.   MRN: [id number] Referring Physician: Dr. Welton Flakes Primary Care: Dr. Sigmund Hazel Primary Cardiologist: None   HPI:  Mrs. Zullo is a 79 y.o. female with history of HTN, HL, GERD, and depression.  She states she had a stress test at one point in time which was normal.    She was recently diagnosed with stage II invasive ductal carcinoma of the left breast and status post mastectomy with finding of 2 foci of invasive ductal carcinoma both are ER/PR positive but the smaller focus measuring 0.7 cm is HER-2/neu positive with a ratio over 4.  After discussion with Dr. Welton Flakes she started Taxol/herceptin on February 09, 2012 for a year. After completion of Herceptin she will receive 5 years of letrozole 2.5 mg daily.   ECHOs: 01/04/12: EF 60-65%, lateral s' 7.9 07/11/12 EF 60-65% Lateral 8.0 10/17/12 EF 60% Lateral S' 8.0 Mild AS  She returns for follow up. Mild dyspnea with exertion. Denies PND/CP. Sleeps on 2 pillows. Compliant with medications. BP has been consistently elevated.     Past Medical History  Diagnosis Date  . Asthma   . Hyperlipidemia     takes Simvastatin daily  . Hypertension     takes Amlodipine and Diovan daily  . Cancer     left breast  . Bronchitis     hx of;last time >13yr ago  . Arthritis     shoulders  . Dysphagia   . H/O hiatal hernia   . GERD (gastroesophageal reflux disease)     takes Prilosec prn  . Urinary urgency   . History of kidney stones     many yrs ago  . Depression     takes Celexa daily  . Insomnia     takes Ambien nightly  . Breast cancer 12/18/11    left breast masectomy=metastatic ca in (1/1) lymph node ,invasive ductal ca,2 foci,,dcis,lymph ovascular invasion identified,surgical resection margins neg for ca,additional tissue=benign skin and subcutaneous tissue  . Breast CA 11/23/11    left breast 3 o'clock bx=high grade ductal ca in situ w/comedononecrosis and  calcification,dcis,lymphovascular invasion present ,ER?PR+positive  . Full dentures   . Hx of radiation therapy 04/26/12 -06/10/12    left breast    Current Outpatient Prescriptions  Medication Sig Dispense Refill  . acyclovir (ZOVIRAX) 400 MG tablet Take 2 tablets (800 mg total) by mouth 5 (five) times daily.  70 tablet  0  . amLODipine (NORVASC) 10 MG tablet Take 10 mg by mouth daily.       . citalopram (CELEXA) 20 MG tablet Take 20 mg by mouth daily.      Marland Kitchen DIOVAN HCT 160-12.5 MG per tablet Take 2 tablets by mouth every morning.       . gabapentin (NEURONTIN) 100 MG capsule TAKE 3 CAPSULES AT BEDTIME  90 capsule  0  . letrozole (FEMARA) 2.5 MG tablet Take 1 tablet (2.5 mg total) by mouth daily.  90 tablet  12  . lidocaine-prilocaine (EMLA) cream Apply topically as needed.  30 g  4  . omeprazole (PRILOSEC) 20 MG capsule Take 20 mg by mouth daily.      Marland Kitchen oxyCODONE (OXY IR/ROXICODONE) 5 MG immediate release tablet Take 1 tablet (5 mg total) by mouth every 4 (four) hours as needed for pain.  90 tablet  0  . potassium chloride SA (K-DUR,KLOR-CON) 20 MEQ tablet Take 1 tablet (20 mEq total) by mouth 2 (two) times  daily.  20 tablet  0  . simvastatin (ZOCOR) 20 MG tablet Take 20 mg by mouth daily.       Marland Kitchen sulfamethoxazole-trimethoprim (BACTRIM DS) 800-160 MG per tablet       . zolpidem (AMBIEN) 10 MG tablet Take 10 mg by mouth at bedtime.       No current facility-administered medications for this encounter.   Facility-Administered Medications Ordered in Other Encounters  Medication Dose Route Frequency Provider Last Rate Last Dose  . sodium chloride 0.9 % injection 10 mL  10 mL Intracatheter PRN Victorino December, MD   10 mL at 03/14/12 1628    No Known Allergies  History   Social History  . Marital Status: Married    Spouse Name: N/A    Number of Children: N/A  . Years of Education: N/A   Occupational History  . Not on file.   Social History Main Topics  . Smoking status: Never  Smoker   . Smokeless tobacco: Never Used  . Alcohol Use: No  . Drug Use: No  . Sexually Active: Yes    Birth Control/ Protection: Surgical     Comment: mensus age 79, 1st pregnancy 79, no hrt gg4,p3, 1 babay lived a few hours complications   Other Topics Concern  . Not on file   Social History Narrative  . No narrative on file    Family History  Problem Relation Age of Onset  . Cancer Father     prostate  . Cancer Other     breast ca    PHYSICAL EXAM: Filed Vitals:   10/17/12 1059  BP: 164/62  Pulse: 96  Weight: 161 lb 1 oz (73.057 kg)  SpO2: 94%    General:  Elderly appearing. No respiratory difficulty HEENT: normal Neck: supple. JVP 6. Carotids 2+ bilat; no bruits. No lymphadenopathy or thryomegaly appreciated. Cor: PMI nondisplaced. Regular rate & rhythm. No rubs, gallops. 2/6 AS murmur. Crisp S2 Lungs: clear Abdomen: soft, nontender, nondistended. No hepatosplenomegaly. No bruits or masses. Good bowel sounds. Extremities: no cyanosis, clubbing, rash, edema Neuro: alert & oriented x 3, cranial nerves grossly intact. moves all 4 extremities w/o difficulty. Affect pleasant.    ASSESSMENT & PLAN:

## 2012-10-19 ENCOUNTER — Other Ambulatory Visit: Payer: Medicare Other

## 2012-10-27 ENCOUNTER — Ambulatory Visit (HOSPITAL_BASED_OUTPATIENT_CLINIC_OR_DEPARTMENT_OTHER): Payer: Medicare Other | Admitting: Adult Health

## 2012-10-27 ENCOUNTER — Ambulatory Visit (HOSPITAL_BASED_OUTPATIENT_CLINIC_OR_DEPARTMENT_OTHER): Payer: Medicare Other | Admitting: Lab

## 2012-10-27 ENCOUNTER — Ambulatory Visit (HOSPITAL_BASED_OUTPATIENT_CLINIC_OR_DEPARTMENT_OTHER): Payer: Medicare Other

## 2012-10-27 ENCOUNTER — Telehealth: Payer: Self-pay | Admitting: Oncology

## 2012-10-27 ENCOUNTER — Ambulatory Visit: Payer: Medicare Other | Admitting: Adult Health

## 2012-10-27 ENCOUNTER — Encounter: Payer: Self-pay | Admitting: Adult Health

## 2012-10-27 ENCOUNTER — Ambulatory Visit: Payer: Medicare Other

## 2012-10-27 ENCOUNTER — Telehealth: Payer: Self-pay | Admitting: *Deleted

## 2012-10-27 ENCOUNTER — Other Ambulatory Visit: Payer: Medicare Other | Admitting: Lab

## 2012-10-27 VITALS — BP 148/66 | HR 74 | Temp 97.7°F | Resp 20 | Ht 59.0 in | Wt 158.1 lb

## 2012-10-27 DIAGNOSIS — E559 Vitamin D deficiency, unspecified: Secondary | ICD-10-CM

## 2012-10-27 DIAGNOSIS — C50919 Malignant neoplasm of unspecified site of unspecified female breast: Secondary | ICD-10-CM

## 2012-10-27 DIAGNOSIS — C50419 Malignant neoplasm of upper-outer quadrant of unspecified female breast: Secondary | ICD-10-CM

## 2012-10-27 DIAGNOSIS — M899 Disorder of bone, unspecified: Secondary | ICD-10-CM

## 2012-10-27 DIAGNOSIS — Z5112 Encounter for antineoplastic immunotherapy: Secondary | ICD-10-CM

## 2012-10-27 DIAGNOSIS — Z853 Personal history of malignant neoplasm of breast: Secondary | ICD-10-CM

## 2012-10-27 DIAGNOSIS — D6481 Anemia due to antineoplastic chemotherapy: Secondary | ICD-10-CM

## 2012-10-27 DIAGNOSIS — Z170 Estrogen receptor positive status [ER+]: Secondary | ICD-10-CM

## 2012-10-27 LAB — CBC WITH DIFFERENTIAL/PLATELET
Basophils Absolute: 0 10*3/uL (ref 0.0–0.1)
Eosinophils Absolute: 0.1 10*3/uL (ref 0.0–0.5)
HCT: 34 % — ABNORMAL LOW (ref 34.8–46.6)
HGB: 11 g/dL — ABNORMAL LOW (ref 11.6–15.9)
LYMPH%: 24.9 % (ref 14.0–49.7)
MCV: 84 fL (ref 79.5–101.0)
MONO%: 9.9 % (ref 0.0–14.0)
NEUT#: 4.9 10*3/uL (ref 1.5–6.5)
NEUT%: 63.3 % (ref 38.4–76.8)
Platelets: 252 10*3/uL (ref 145–400)
RBC: 4.05 10*6/uL (ref 3.70–5.45)

## 2012-10-27 LAB — COMPREHENSIVE METABOLIC PANEL (CC13)
Alkaline Phosphatase: 82 U/L (ref 40–150)
BUN: 22.5 mg/dL (ref 7.0–26.0)
CO2: 24 mEq/L (ref 22–29)
Creatinine: 0.8 mg/dL (ref 0.6–1.1)
Glucose: 135 mg/dl — ABNORMAL HIGH (ref 70–99)
Total Bilirubin: 0.25 mg/dL (ref 0.20–1.20)

## 2012-10-27 MED ORDER — HEPARIN SOD (PORK) LOCK FLUSH 100 UNIT/ML IV SOLN
500.0000 [IU] | Freq: Once | INTRAVENOUS | Status: AC | PRN
  Administered 2012-10-27: 500 [IU]
  Filled 2012-10-27: qty 5

## 2012-10-27 MED ORDER — SODIUM CHLORIDE 0.9 % IJ SOLN
10.0000 mL | INTRAMUSCULAR | Status: DC | PRN
  Administered 2012-10-27: 10 mL
  Filled 2012-10-27: qty 10

## 2012-10-27 MED ORDER — TRASTUZUMAB CHEMO INJECTION 440 MG
6.0000 mg/kg | Freq: Once | INTRAVENOUS | Status: AC
  Administered 2012-10-27: 462 mg via INTRAVENOUS
  Filled 2012-10-27: qty 22

## 2012-10-27 MED ORDER — DIPHENHYDRAMINE HCL 25 MG PO CAPS
50.0000 mg | ORAL_CAPSULE | Freq: Once | ORAL | Status: AC
  Administered 2012-10-27: 50 mg via ORAL

## 2012-10-27 MED ORDER — ACETAMINOPHEN 325 MG PO TABS
650.0000 mg | ORAL_TABLET | Freq: Once | ORAL | Status: AC
  Administered 2012-10-27: 650 mg via ORAL

## 2012-10-27 MED ORDER — SODIUM CHLORIDE 0.9 % IV SOLN
Freq: Once | INTRAVENOUS | Status: AC
  Administered 2012-10-27: 16:00:00 via INTRAVENOUS

## 2012-10-27 NOTE — Patient Instructions (Signed)
Please take Calcium one tablet twice a day, and Vitamin D3 1000 IU daily.  Please call us if you have any questions or concerns.

## 2012-10-27 NOTE — Progress Notes (Signed)
OFFICE PROGRESS NOTE  CC  Brandy Labella, MD [address] [place] [address] Dr. Emelia Loron Dr. Antony Blackbird  DIAGNOSIS:  79 year old female with new diagnosis of multifocal breast cancer of the left breast. Patient is status post mastectomy with sentinel lymph node biopsy performed on 12/18/2011.   PRIOR THERAPY: 1.Patient has had multiple biopsies performed including endoscopic ultrasound guided biopsies as well as an open biopsy without a definitive diagnosis  #2 patient now with new diagnosis of multifocal breast cancer of the left breast. She underwent a mammogram after she felt a mass in the left breast. The mammogram showed an abnormality. As a biopsy was performed and she was found to have an ace invasive ductal carcinoma. She has now had a mastectomy on 12/18/2011. The final pathology revealed 2 foci of well-differentiated invasive ductal carcinoma one measuring 1.7 cm and the second measuring 0.7 cm there was ductal carcinoma in situ lymphovascular invasion was also identified all surgical margins were negative for carcinoma. Patient went on to also have a left sentinel node biopsy one sentinel node was positive for metastatic disease with focal capsular extension. The tumor was estrogen receptor positive progesterone receptor positive. The largest tumor measuring 1.7 cm was HER-2/neu negative with a Ki-67 of 13%. The smaller tumor showed ER +100% PR receptor 7% proliferation marker 50% and it was HER-2/neu positive with a ratio 4.94.  #3 patient has been seen by radiation oncology and post mastectomy radiation therapy is recommended. At this time she will not get a left axillary lymph node dissection.  #4 patient's tumor is HER-2/neu positive and does she will receive adjuvant chemotherapy and Herceptin based therapy.  #5 patient will proceed with combination chemotherapy consisting of Taxol and Herceptin starting on 02/09/2012. A total of 12 weeks of combination  treatment is planned. Once she completes this she will undergo radiation therapy. After which she will receive Herceptin for one year with 5 years of letrozole 2.5 mg daily.  CURRENT THERAPY: Q 3 week herceptin and Letrozole daily.    INTERVAL HISTORY: Brandy [name] 79 y.o. female returns for followup visit today for evaluation prior to Herceptin therapy.  She continues to take Letrozole and does have some joint aches, but doesn't have hot flashes, dryness, or any further concerns.  She did have an appt with Dr. Gala Romney and her echo was stable.  He started her on Spironalactone. Her bone density demonstrating osteopenia.    MEDICAL HISTORY: Past Medical History  Diagnosis Date  . Asthma   . Hyperlipidemia     takes Simvastatin daily  . Hypertension     takes Amlodipine and Diovan daily  . Cancer     left breast  . Bronchitis     hx of;last time >49yr ago  . Arthritis     shoulders  . Dysphagia   . H/O hiatal hernia   . GERD (gastroesophageal reflux disease)     takes Prilosec prn  . Urinary urgency   . History of kidney stones     many yrs ago  . Depression     takes Celexa daily  . Insomnia     takes Ambien nightly  . Breast cancer 12/18/11    left breast masectomy=metastatic ca in (1/1) lymph node ,invasive ductal ca,2 foci,,dcis,lymph ovascular invasion identified,surgical resection margins neg for ca,additional tissue=benign skin and subcutaneous tissue  . Breast CA 11/23/11    left breast 3 o'clock bx=high grade ductal ca in situ w/comedononecrosis and calcification,dcis,lymphovascular invasion  present ,ER?PR+positive  . Full dentures   . Hx of radiation therapy 04/26/12 -06/10/12    left breast    ALLERGIES:  has No Known Allergies.  MEDICATIONS:  Current Outpatient Prescriptions  Medication Sig Dispense Refill  . acyclovir (ZOVIRAX) 400 MG tablet Take 2 tablets (800 mg total) by mouth 5 (five) times daily.  70 tablet  0  . amLODipine (NORVASC) 10 MG tablet Take  10 mg by mouth daily.       . citalopram (CELEXA) 20 MG tablet Take 20 mg by mouth daily.      Marland Kitchen DIOVAN HCT 160-12.5 MG per tablet Take 2 tablets by mouth every morning.       . gabapentin (NEURONTIN) 100 MG capsule TAKE 3 CAPSULES AT BEDTIME  90 capsule  0  . letrozole (FEMARA) 2.5 MG tablet Take 1 tablet (2.5 mg total) by mouth daily.  90 tablet  12  . lidocaine-prilocaine (EMLA) cream Apply topically as needed.  30 g  4  . omeprazole (PRILOSEC) 20 MG capsule Take 20 mg by mouth daily.      Marland Kitchen oxyCODONE (OXY IR/ROXICODONE) 5 MG immediate release tablet Take 1 tablet (5 mg total) by mouth every 4 (four) hours as needed for pain.  90 tablet  0  . potassium chloride SA (K-DUR,KLOR-CON) 20 MEQ tablet Take 1 tablet (20 mEq total) by mouth 2 (two) times daily.  20 tablet  0  . simvastatin (ZOCOR) 20 MG tablet Take 20 mg by mouth daily.       Marland Kitchen spironolactone (ALDACTONE) 25 MG tablet Take 0.5 tablets (12.5 mg total) by mouth daily.  30 tablet  3  . sulfamethoxazole-trimethoprim (BACTRIM DS) 800-160 MG per tablet       . zolpidem (AMBIEN) 10 MG tablet Take 10 mg by mouth at bedtime.       No current facility-administered medications for this visit.   Facility-Administered Medications Ordered in Other Visits  Medication Dose Route Frequency Provider Last Rate Last Dose  . sodium chloride 0.9 % injection 10 mL  10 mL Intracatheter PRN Victorino December, MD   10 mL at 03/14/12 1628    SURGICAL HISTORY:  Past Surgical History  Procedure Laterality Date  . Breast biopsy  1998    left  . Total shoulder replacement  2011    left  . Eus  06/04/2011    Procedure: UPPER ENDOSCOPIC ULTRASOUND (EUS) LINEAR;  Surgeon: Rob Bunting, MD;  Location: [facility];  Service: Endoscopy;  Laterality: N/A;  . Exploratory laparotomy       biopsy of intra-abdominal mass  . Bladder tack    . Tubal ligation    . Appendectomy    . Dilation and curettage of uterus    . Esophagogastroduodenoscopy    . Mastectomy w/  sentinel node biopsy  12/18/2011    Procedure: MASTECTOMY WITH SENTINEL LYMPH NODE BIOPSY;  Surgeon: Emelia Loron, MD;  Location: [facility] OR;  Service: General;  Laterality: Left;  . Portacath placement  01/27/2012    Procedure: INSERTION PORT-A-CATH;  Surgeon: Emelia Loron, MD;  Location: [facility];  Service: General;  Laterality: Right;  PORT PLACEMENT    REVIEW OF SYSTEMS:   General: fatigue (+), night sweats (-), fever (-), pain (-) Lymph: palpable nodes (-) HEENT: vision changes (-), mucositis (-), gum bleeding (-), epistaxis (-) Cardiovascular: chest pain (-), palpitations (-) Pulmonary: shortness of breath (-), dyspnea on exertion (-), cough (-), hemoptysis (-) GI:  Early satiety (-),  melena (-), dysphagia (-), nausea/vomiting (-), diarrhea (-) GU: dysuria (-), hematuria (-), incontinence (-) Musculoskeletal: joint swelling (-), joint pain (-), back pain (-) Neuro: weakness (-), numbness (-), headache (-), confusion (-) Skin: Rash (-), lesions (-), dryness (-) Psych: depression (-), suicidal/homicidal ideation (-), feeling of hopelessness (-)  Health Maintenance  Mammogram: 11/2011 Colonoscopy: Never Bone Density Scan: 10/28/12 Pap Smear: Years Eye Exam: 2 years ago Vitamin D Level: 1/14 Lipid Panel: 2011   PHYSICAL EXAMINATION:  BP 148/66  Pulse 74  Temp(Src) 97.7 F (36.5 C) (Oral)  Resp 20  Ht 4\' 11"  (1.499 m)  Wt 158 lb 1.6 oz (71.714 kg)  BMI 31.92 kg/m2 General: Patient is an elderly appearing female in no acute distress HEENT: PERRLA, sclerae anicteric no conjunctival pallor, MMM Neck: supple, no palpable adenopathy Lungs: clear to auscultation bilaterally, no wheezes, rhonchi, or rales Cardiovascular: regular rate rhythm, S1, S2, systolic murmur, rubs or gallops Abdomen: Soft, non-tender, non-distended, normoactive bowel sounds, no HSM Extremities: warm and well perfused, no clubbing, cyanosis, or edema Skin: No rash or lesions, previous  shingles and superinfection have healed. Neuro: Non-focal Breast: Left mastectomy no nodularity, or local sign of recurrence.  Right breast is without masses ECOG PERFORMANCE STATUS: 1 - Symptomatic but completely ambulatory  LABORATORY DATA: Lab Results  Component Value Date   WBC 7.8 10/27/2012   HGB 11.0* 10/27/2012   HCT 34.0* 10/27/2012   MCV 84.0 10/27/2012   PLT 252 10/27/2012      Chemistry      Component Value Date/Time   NA 139 10/06/2012 1341   NA 139 02/22/2012 1148   K 3.8 10/06/2012 1341   K 4.0 02/22/2012 1148   CL 106 10/06/2012 1341   CL 106 02/22/2012 1148   CO2 24 10/06/2012 1341   CO2 27 02/22/2012 1148   BUN 17.4 10/06/2012 1341   BUN 15 02/22/2012 1148   CREATININE 0.7 10/06/2012 1341   CREATININE 0.52 02/22/2012 1148      Component Value Date/Time   CALCIUM 9.5 10/06/2012 1341   CALCIUM 8.6 02/22/2012 1148   ALKPHOS 83 10/06/2012 1341   ALKPHOS 59 02/22/2012 1148   AST 19 10/06/2012 1341   AST 21 02/22/2012 1148   ALT 17 10/06/2012 1341   ALT 30 02/22/2012 1148   BILITOT 0.27 10/06/2012 1341   BILITOT 0.2* 02/22/2012 1148    ADDITIONAL INFORMATION: 2. PROGNOSTIC INDICATORS - ACIS Results IMMUNOHISTOCHEMICAL AND MORPHOMETRIC ANALYSIS BY THE AUTOMATED CELLULAR IMAGING SYSTEM (ACIS) THE SMALLER TUMOR SHOWS THE FOLLOWING BREAST PROGNOSTIC PROFILE: Estrogen Receptor (Negative, <1%): 100%,POSITIVE, STRONG STAINING INTENSITY Progesterone Receptor (Negative, <1%): 7%,POSITIVE, STRONG STAINING INTENSITY Proliferation Marker Ki67 by M IB-1 (Low<20%): 50% All controls stained appropriately Abigail Miyamoto MD Pathologist, Electronic Signature ( Signed 12/25/2011) 2. CHROMOGENIC IN-SITU HYBRIDIZATION Interpretation: 0.7 CM SMALLER TUMOR: HER2/NEU BY CISH - SHOWS AMPLIFICATION BY CISH ANALYSIS. THE RATIO OF HER2: CEP 17 SIGNALS WAS 4.94. 1.7 CM LEFT SUPERIOR TUMOR: HER-2/NEU BY CISH - NO AMPLIFICATION OF HER-2 DETECTED. THE RATIO OF HER-2: CEP 17 SIGNALS WAS 1.14. Reference  range: Ratio: HER2:CEP17 < 1.8 - gene amplification not observed Ratio: HER2:CEP 17 1.8-2.2 - equivocal result Ratio: HER2:CEP17 > 2.2 - gene amplification observed 1 of 4 FINAL for Kolek, Natlie S [phone]) ADDITIONAL INFORMATION:(continued) Comment: The cancer center was notified on 12-24-2011. Abigail Miyamoto MD Pathologist, Electronic Signature ( Signed 12/24/2011) FINAL DIAGNOSIS Diagnosis 1. Lymph node, sentinel, biopsy, Left - METASTATIC CARCINOMA IN 1 OF 1 LYMPH NODE  WITH FOCAL CAPSULAR EXTENSION (1/1). 2. Breast, simple mastectomy, Left - INVASIVE DUCTAL CARCINOMA, TWO FOCI, WELL DIFFERENTIATED, SPANNING 1.7 AND 0.7 CM. - DUCTAL CARCINOMA IN SITU. - LYMPHOVASCULAR INVASION IS IDENTIFIED. - THE SURGICAL RESECTION MARGINS ARE NEGATIVE FOR CARCINOMA. - SEE ONCOLOGY TABLE BELOW. 3. Breast, biopsy, left, additional tissue - BENIGN SKIN AND SUBCUTANEOUS TISSUE. - THERE IS NO EVIDENCE OF MALIGNANCY. Microscopic Comment 2. BREAST, INVASIVE TUMOR, WITH LYMPH NODE SAMPLING Specimen, including laterality: Left breast. Procedure: Simple mastectomy Grade: Both are I Tubule formation: 1 and 1 Nuclear pleomorphism: 1 and 2 Mitotic:1 and 1 Tumor size (gross measurement): 1.7 and 0.7 cm Margins: Negative for carcinoma Invasive, distance to closest margin: 2.0 cm to the deep margin (gross measurement) In-situ, distance to closest margin: 2.0 cm from the deep margin (gross measurement). Lymphovascular invasion: Present. Ductal carcinoma in situ: Present. Grade: Intermediate grade. Extensive intraductal component: No. Lobular neoplasia: Not identified. Tumor focality: Two foci. Treatment effect: N/A Extent of tumor: Confined to breast parenchyma Lymph nodes: # examined: 1 Lymph nodes with metastasis: 1 Isolated tumor cells (< 0.2 mm): 0 Micrometastasis: (> 0.2 mm and < 2.0 mm): 0 Macrometastasis: (> 2.0 mm): 1 Extracapsular extension: Present, focal. 2 of 4 FINAL for  Monks, Tamaira S [phone]) Microscopic Comment(continued) Breast prognostic profile: [phone] Estrogen receptor: Positive (100%, strong staining intensity). Progesterone receptor: Positive (100%, strong staining intensity. Her 2 neu: No amplification was detected. The ratio was 1.45. Her 2 neu by CISH will be repeated on both tumors and the results reported separately. Ki-67: 13% Non-neoplastic breast: Healing biopsy site. TNM: mpT1c, pN1a Comments: Grossly, there are two foci of grade I invasive ductal carcinoma present in the simple mastectomy. The larger nodule spans 1.7 cm and is histologically identical to the previous core biopsy, [phone] ("left superior"). The second nodule, 0.7 cm, is grade I invasive ductal carcinoma with extracellular mucin. A complete profile will be performed on this second, smaller nodule. (JBK:gt, 12/22/11) Pecola Leisure MD Pathologist, Electronic Signature   RADIOGRAPHIC STUDIES:  No results found.  ASSESSMENT: 79 year old female with:  #1 new diagnosis of stage II invasive ductal carcinoma of the left breast she is status post mastectomy with finding of 2 foci of invasive ductal carcinoma both are ER/PR positive but the smaller focus measuring 0.7 cm is HER-2/neu positive with a ratio over 4.  #2 patient was begun on adjuvant chemotherapy and Herceptin. With chemotherapy consisting of Taxol given weekly with weekly Herceptin. She received her first cycle on 02/08/2012- 10/13.  Patient subsequently could not tolerate the Taxol so therefore this was discontinued.  #3 she was then begun on Herceptin every 3 weeks beginning on 05/02/12.  #3 Anemia due to chemotherapy  #4 Osteopenia  PLAN: 1. Proceed with herceptin and continue daily Femara. She had an appt with Dr. Gala Romney on 4/14 and he cleared her to continue with Herceptin.   2. Return in weeks for her next dose of Herceptin.  3. For the osteopenia, she will take calcium, vitamin  d, and I will look into getting her Prolia.  If we cannot give her prolia, I will start her on Fosamax.     She knows to call with any questions or concerns before her next appointment.    I spent 25 minutes counseling the patient face to face. The total time spent in the appointment was 30 minutes.  Cherie Ouch Lyn Hollingshead, NP Medical Oncology [facility] Phone: [phone]

## 2012-10-27 NOTE — Patient Instructions (Addendum)
[facility] Discharge Instructions for Patients Receiving Chemotherapy  Today you received the following chemotherapy agents Herceptin.  To help prevent nausea and vomiting after your treatment, we encourage you to take your nausea medication as prescribed.   If you develop nausea and vomiting that is not controlled by your nausea medication, call the clinic. If it is after clinic hours your family physician or the after hours number for the clinic or go to the Emergency Department.   BELOW ARE SYMPTOMS THAT SHOULD BE REPORTED IMMEDIATELY:  *FEVER GREATER THAN 100.5 F  *CHILLS WITH OR WITHOUT FEVER  NAUSEA AND VOMITING THAT IS NOT CONTROLLED WITH YOUR NAUSEA MEDICATION  *UNUSUAL SHORTNESS OF BREATH  *UNUSUAL BRUISING OR BLEEDING  TENDERNESS IN MOUTH AND THROAT WITH OR WITHOUT PRESENCE OF ULCERS  *URINARY PROBLEMS  *BOWEL PROBLEMS  UNUSUAL RASH Items with * indicate a potential emergency and should be followed up as soon as possible.  Feel free to call the clinic you have any questions or concerns. The clinic phone number is [phone].   I have been informed and understand all the instructions given to me. I know to contact the clinic, my physician, or go to the Emergency Department if any problems should occur. I do not have any questions at this time, but understand that I may call the clinic during office hours   should I have any questions or need assistance in obtaining follow up care.    __________________________________________  _____________  __________ Signature of Patient or Authorized Representative            Date                   Time    __________________________________________ Nurse's Signature    

## 2012-10-27 NOTE — Telephone Encounter (Signed)
Per staff phone call and POF I have schedueld appts.  JMW  

## 2012-11-07 ENCOUNTER — Encounter (INDEPENDENT_AMBULATORY_CARE_PROVIDER_SITE_OTHER): Payer: Self-pay | Admitting: General Surgery

## 2012-11-07 ENCOUNTER — Ambulatory Visit (INDEPENDENT_AMBULATORY_CARE_PROVIDER_SITE_OTHER): Payer: Medicare Other | Admitting: General Surgery

## 2012-11-07 VITALS — BP 130/72 | HR 74 | Temp 97.6°F | Resp 18 | Ht 59.0 in | Wt 159.8 lb

## 2012-11-07 DIAGNOSIS — C50412 Malignant neoplasm of upper-outer quadrant of left female breast: Secondary | ICD-10-CM

## 2012-11-07 DIAGNOSIS — C50419 Malignant neoplasm of upper-outer quadrant of unspecified female breast: Secondary | ICD-10-CM

## 2012-11-07 NOTE — Progress Notes (Signed)
Subjective:     Patient ID: Brandy [name], female   DOB: 15-Feb-1933, 80 y.o.   MRN: [id number]  HPI This is a 79 year old female I know from a laparotomy previously. She subsequently developed a left breast cancer. This was treated with a mastectomy and node biopsy. This is a stage II invasive ductal carcinoma. She received radiation therapy due to her nodes. She received chemotherapy as well as Herceptin which she will continue until August or so. She comes back in today doing fairly well from all of her treatments. She still has a lot of significant fatigue. She also has a very stressful home life right now she is not given a lot of support from her husband. We discussed this for about 15 minutes today.  Review of Systems     Objective:   Physical Exam  Vitals reviewed. Constitutional: She appears well-developed and well-nourished.  Pulmonary/Chest: Right breast exhibits no inverted nipple, no mass, no nipple discharge, no skin change and no tenderness.    Abdominal: Soft. She exhibits distension. No hernia.    Lymphadenopathy:    She has no cervical adenopathy.    She has no axillary adenopathy.       Right: No supraclavicular adenopathy present.       Left: No supraclavicular adenopathy present.       Assessment:    stage 2 left breast cancer     Plan:     She is continuing herceptin and letrozole.  She has a lot of fatigue and this may be in part due to all of her treatments as well as her stressful home life right now. She should be due mm in June on the right side.  I offered her to make use of services at cancer center but she declined for now.  I can see her back in a year or sooner if needed.

## 2012-11-17 ENCOUNTER — Ambulatory Visit (HOSPITAL_BASED_OUTPATIENT_CLINIC_OR_DEPARTMENT_OTHER): Payer: Medicare Other | Admitting: Adult Health

## 2012-11-17 ENCOUNTER — Encounter: Payer: Self-pay | Admitting: Adult Health

## 2012-11-17 ENCOUNTER — Ambulatory Visit (HOSPITAL_BASED_OUTPATIENT_CLINIC_OR_DEPARTMENT_OTHER): Payer: Medicare Other

## 2012-11-17 ENCOUNTER — Other Ambulatory Visit: Payer: Medicare Other | Admitting: Lab

## 2012-11-17 ENCOUNTER — Other Ambulatory Visit (HOSPITAL_BASED_OUTPATIENT_CLINIC_OR_DEPARTMENT_OTHER): Payer: Medicare Other | Admitting: Lab

## 2012-11-17 ENCOUNTER — Ambulatory Visit: Payer: Medicare Other | Admitting: Adult Health

## 2012-11-17 VITALS — BP 152/78 | HR 93 | Temp 97.8°F | Resp 20 | Ht 59.0 in | Wt 161.2 lb

## 2012-11-17 DIAGNOSIS — E559 Vitamin D deficiency, unspecified: Secondary | ICD-10-CM

## 2012-11-17 DIAGNOSIS — Z5112 Encounter for antineoplastic immunotherapy: Secondary | ICD-10-CM

## 2012-11-17 DIAGNOSIS — Z79899 Other long term (current) drug therapy: Secondary | ICD-10-CM

## 2012-11-17 DIAGNOSIS — C50919 Malignant neoplasm of unspecified site of unspecified female breast: Secondary | ICD-10-CM

## 2012-11-17 DIAGNOSIS — D6481 Anemia due to antineoplastic chemotherapy: Secondary | ICD-10-CM

## 2012-11-17 DIAGNOSIS — C50419 Malignant neoplasm of upper-outer quadrant of unspecified female breast: Secondary | ICD-10-CM

## 2012-11-17 DIAGNOSIS — M8580 Other specified disorders of bone density and structure, unspecified site: Secondary | ICD-10-CM | POA: Insufficient documentation

## 2012-11-17 DIAGNOSIS — Z170 Estrogen receptor positive status [ER+]: Secondary | ICD-10-CM

## 2012-11-17 DIAGNOSIS — M899 Disorder of bone, unspecified: Secondary | ICD-10-CM

## 2012-11-17 DIAGNOSIS — C50412 Malignant neoplasm of upper-outer quadrant of left female breast: Secondary | ICD-10-CM

## 2012-11-17 DIAGNOSIS — T451X5A Adverse effect of antineoplastic and immunosuppressive drugs, initial encounter: Secondary | ICD-10-CM

## 2012-11-17 LAB — CBC WITH DIFFERENTIAL/PLATELET
BASO%: 0.4 % (ref 0.0–2.0)
Eosinophils Absolute: 0.3 10*3/uL (ref 0.0–0.5)
HCT: 35.3 % (ref 34.8–46.6)
LYMPH%: 25.6 % (ref 14.0–49.7)
MONO#: 0.7 10*3/uL (ref 0.1–0.9)
NEUT#: 5.9 10*3/uL (ref 1.5–6.5)
NEUT%: 63.2 % (ref 38.4–76.8)
Platelets: 256 10*3/uL (ref 145–400)
WBC: 9.3 10*3/uL (ref 3.9–10.3)
lymph#: 2.4 10*3/uL (ref 0.9–3.3)
nRBC: 0 % (ref 0–0)

## 2012-11-17 LAB — COMPREHENSIVE METABOLIC PANEL (CC13)
ALT: 18 U/L (ref 0–55)
AST: 18 U/L (ref 5–34)
Albumin: 3.4 g/dL — ABNORMAL LOW (ref 3.5–5.0)
CO2: 23 mEq/L (ref 22–29)
Calcium: 9.9 mg/dL (ref 8.4–10.4)
Chloride: 105 mEq/L (ref 98–107)
Creatinine: 0.7 mg/dL (ref 0.6–1.1)
Potassium: 4.1 mEq/L (ref 3.5–5.1)

## 2012-11-17 MED ORDER — TRASTUZUMAB CHEMO INJECTION 440 MG
6.0000 mg/kg | Freq: Once | INTRAVENOUS | Status: AC
  Administered 2012-11-17: 462 mg via INTRAVENOUS
  Filled 2012-11-17: qty 22

## 2012-11-17 MED ORDER — HEPARIN SOD (PORK) LOCK FLUSH 100 UNIT/ML IV SOLN
500.0000 [IU] | Freq: Once | INTRAVENOUS | Status: AC | PRN
  Administered 2012-11-17: 500 [IU]
  Filled 2012-11-17: qty 5

## 2012-11-17 MED ORDER — SODIUM CHLORIDE 0.9 % IJ SOLN
10.0000 mL | INTRAMUSCULAR | Status: DC | PRN
  Administered 2012-11-17: 10 mL
  Filled 2012-11-17: qty 10

## 2012-11-17 MED ORDER — SODIUM CHLORIDE 0.9 % IV SOLN
Freq: Once | INTRAVENOUS | Status: AC
  Administered 2012-11-17: 15:00:00 via INTRAVENOUS

## 2012-11-17 MED ORDER — ACETAMINOPHEN 325 MG PO TABS
650.0000 mg | ORAL_TABLET | Freq: Once | ORAL | Status: AC
  Administered 2012-11-17: 650 mg via ORAL

## 2012-11-17 MED ORDER — DIPHENHYDRAMINE HCL 25 MG PO CAPS
50.0000 mg | ORAL_CAPSULE | Freq: Once | ORAL | Status: AC
  Administered 2012-11-17: 50 mg via ORAL

## 2012-11-17 NOTE — Progress Notes (Signed)
OFFICE PROGRESS NOTE  CC  Brandy Labella, MD [address] [place] [address] Dr. Emelia Loron Dr. Antony Blackbird  DIAGNOSIS:  79 year old female with new diagnosis of multifocal breast cancer of the left breast. Patient is status post mastectomy with sentinel lymph node biopsy performed on 12/18/2011.   PRIOR THERAPY: 1.Patient has had multiple biopsies performed including endoscopic ultrasound guided biopsies as well as an open biopsy without a definitive diagnosis  #2 patient now with new diagnosis of multifocal breast cancer of the left breast. She underwent a mammogram after she felt a mass in the left breast. The mammogram showed an abnormality. As a biopsy was performed and she was found to have an ace invasive ductal carcinoma. She has now had a mastectomy on 12/18/2011. The final pathology revealed 2 foci of well-differentiated invasive ductal carcinoma one measuring 1.7 cm and the second measuring 0.7 cm there was ductal carcinoma in situ lymphovascular invasion was also identified all surgical margins were negative for carcinoma. Patient went on to also have a left sentinel node biopsy one sentinel node was positive for metastatic disease with focal capsular extension. The tumor was estrogen receptor positive progesterone receptor positive. The largest tumor measuring 1.7 cm was HER-2/neu negative with a Ki-67 of 13%. The smaller tumor showed ER +100% PR receptor 7% proliferation marker 50% and it was HER-2/neu positive with a ratio 4.94.  #3 patient has been seen by radiation oncology and post mastectomy radiation therapy is recommended. At this time she will not get a left axillary lymph node dissection.  #4 patient's tumor is HER-2/neu positive and does she will receive adjuvant chemotherapy and Herceptin based therapy.  #5 patient will proceed with combination chemotherapy consisting of Taxol and Herceptin starting on 02/09/2012. A total of 12 weeks of combination  treatment is planned. Once she completes this she will undergo radiation therapy. After which she will receive Herceptin for one year with 5 years of letrozole 2.5 mg daily.  CURRENT THERAPY: Q 3 week herceptin and Letrozole daily.    INTERVAL HISTORY: Brandy [name] 79 y.o. female returns for followup visit today for evaluation prior to Herceptin therapy.  She continues to take Letrozole and does have some joint aches, but is otherwise continuing to tolerate it well.  She is receiving Herceptin every three weeks and doing well with this.  She denies hot flashes, shortness of breath, PND, chest pain, palpitation, skin changes, swelling, or any further concerns.    MEDICAL HISTORY: Past Medical History  Diagnosis Date  . Asthma   . Hyperlipidemia     takes Simvastatin daily  . Hypertension     takes Amlodipine and Diovan daily  . Cancer     left breast  . Bronchitis     hx of;last time >73yr ago  . Arthritis     shoulders  . Dysphagia   . H/O hiatal hernia   . GERD (gastroesophageal reflux disease)     takes Prilosec prn  . Urinary urgency   . History of kidney stones     many yrs ago  . Depression     takes Celexa daily  . Insomnia     takes Ambien nightly  . Breast cancer 12/18/11    left breast masectomy=metastatic ca in (1/1) lymph node ,invasive ductal ca,2 foci,,dcis,lymph ovascular invasion identified,surgical resection margins neg for ca,additional tissue=benign skin and subcutaneous tissue  . Breast CA 11/23/11    left breast 3 o'clock bx=high grade ductal ca in  situ w/comedononecrosis and calcification,dcis,lymphovascular invasion present ,ER?PR+positive  . Full dentures   . Hx of radiation therapy 04/26/12 -06/10/12    left breast    ALLERGIES:  has No Known Allergies.  MEDICATIONS:  Current Outpatient Prescriptions  Medication Sig Dispense Refill  . amLODipine (NORVASC) 10 MG tablet Take 10 mg by mouth daily.       . citalopram (CELEXA) 20 MG tablet Take 20 mg  by mouth daily.      Marland Kitchen DIOVAN HCT 160-12.5 MG per tablet Take 2 tablets by mouth every morning.       . gabapentin (NEURONTIN) 100 MG capsule TAKE 3 CAPSULES AT BEDTIME  90 capsule  0  . letrozole (FEMARA) 2.5 MG tablet Take 1 tablet (2.5 mg total) by mouth daily.  90 tablet  12  . lidocaine-prilocaine (EMLA) cream Apply topically as needed.  30 g  4  . omeprazole (PRILOSEC) 20 MG capsule Take 20 mg by mouth daily.      Marland Kitchen oxyCODONE (OXY IR/ROXICODONE) 5 MG immediate release tablet Take 1 tablet (5 mg total) by mouth every 4 (four) hours as needed for pain.  90 tablet  0  . potassium chloride SA (K-DUR,KLOR-CON) 20 MEQ tablet Take 1 tablet (20 mEq total) by mouth 2 (two) times daily.  20 tablet  0  . simvastatin (ZOCOR) 20 MG tablet Take 20 mg by mouth daily.       Marland Kitchen spironolactone (ALDACTONE) 25 MG tablet Take 0.5 tablets (12.5 mg total) by mouth daily.  30 tablet  3  . sulfamethoxazole-trimethoprim (BACTRIM DS) 800-160 MG per tablet       . zolpidem (AMBIEN) 10 MG tablet Take 10 mg by mouth at bedtime.       No current facility-administered medications for this visit.   Facility-Administered Medications Ordered in Other Visits  Medication Dose Route Frequency Provider Last Rate Last Dose  . sodium chloride 0.9 % injection 10 mL  10 mL Intracatheter PRN Victorino December, MD   10 mL at 03/14/12 1628    SURGICAL HISTORY:  Past Surgical History  Procedure Laterality Date  . Breast biopsy  1998    left  . Total shoulder replacement  2011    left  . Eus  06/04/2011    Procedure: UPPER ENDOSCOPIC ULTRASOUND (EUS) LINEAR;  Surgeon: Rob Bunting, MD;  Location: [facility];  Service: Endoscopy;  Laterality: N/A;  . Exploratory laparotomy       biopsy of intra-abdominal mass  . Bladder tack    . Tubal ligation    . Appendectomy    . Dilation and curettage of uterus    . Esophagogastroduodenoscopy    . Mastectomy w/ sentinel node biopsy  12/18/2011    Procedure: MASTECTOMY WITH SENTINEL  LYMPH NODE BIOPSY;  Surgeon: Emelia Loron, MD;  Location: [facility];  Service: General;  Laterality: Left;  . Portacath placement  01/27/2012    Procedure: INSERTION PORT-A-CATH;  Surgeon: Emelia Loron, MD;  Location: [facility];  Service: General;  Laterality: Right;  PORT PLACEMENT    REVIEW OF SYSTEMS:   General: fatigue (+), night sweats (-), fever (-), pain (-) Lymph: palpable nodes (-) HEENT: vision changes (-), mucositis (-), gum bleeding (-), epistaxis (-) Cardiovascular: chest pain (-), palpitations (-) Pulmonary: shortness of breath (-), dyspnea on exertion (-), cough (-), hemoptysis (-) GI:  Early satiety (-), melena (-), dysphagia (-), nausea/vomiting (-), diarrhea (-) GU: dysuria (-), hematuria (-), incontinence (-) Musculoskeletal: joint swelling (-),  joint pain (-), back pain (-) Neuro: weakness (-), numbness (-), headache (-), confusion (-) Skin: Rash (-), lesions (-), dryness (-) Psych: depression (-), suicidal/homicidal ideation (-), feeling of hopelessness (-)  Health Maintenance  Mammogram: 11/2011 Colonoscopy: Never Bone Density Scan: 10/28/12 Pap Smear: Years Eye Exam: 2 years ago Vitamin D Level: 1/14 Lipid Panel: 2011   PHYSICAL EXAMINATION:  BP 152/78  Pulse 93  Temp(Src) 97.8 F (36.6 C) (Oral)  Resp 20  Ht 4\' 11"  (1.499 m)  Wt 161 lb 3.2 oz (73.12 kg)  BMI 32.54 kg/m2 General: Patient is an elderly appearing female in no acute distress HEENT: PERRLA, sclerae anicteric no conjunctival pallor, MMM Neck: supple, no palpable adenopathy Lungs: clear to auscultation bilaterally, no wheezes, rhonchi, or rales Cardiovascular: regular rate rhythm, S1, S2, systolic murmur, rubs or gallops Abdomen: Soft, non-tender, non-distended, normoactive bowel sounds, no HSM Extremities: warm and well perfused, no clubbing, cyanosis, or edema Skin: No rash or lesions, previous shingles and superinfection have healed. Neuro: Non-focal Breast: Left  mastectomy no nodularity, or local sign of recurrence.  Right breast is without masses ECOG PERFORMANCE STATUS: 1 - Symptomatic but completely ambulatory  LABORATORY DATA: Lab Results  Component Value Date   WBC 9.3 11/17/2012   HGB 11.4* 11/17/2012   HCT 35.3 11/17/2012   MCV 83.8 11/17/2012   PLT 256 11/17/2012      Chemistry      Component Value Date/Time   NA 138 11/17/2012 1333   NA 139 02/22/2012 1148   K 4.1 11/17/2012 1333   K 4.0 02/22/2012 1148   CL 105 11/17/2012 1333   CL 106 02/22/2012 1148   CO2 23 11/17/2012 1333   CO2 27 02/22/2012 1148   BUN 18.2 11/17/2012 1333   BUN 15 02/22/2012 1148   CREATININE 0.7 11/17/2012 1333   CREATININE 0.52 02/22/2012 1148      Component Value Date/Time   CALCIUM 9.9 11/17/2012 1333   CALCIUM 8.6 02/22/2012 1148   ALKPHOS 87 11/17/2012 1333   ALKPHOS 59 02/22/2012 1148   AST 18 11/17/2012 1333   AST 21 02/22/2012 1148   ALT 18 11/17/2012 1333   ALT 30 02/22/2012 1148   BILITOT 0.32 11/17/2012 1333   BILITOT 0.2* 02/22/2012 1148    ADDITIONAL INFORMATION: 2. PROGNOSTIC INDICATORS - ACIS Results IMMUNOHISTOCHEMICAL AND MORPHOMETRIC ANALYSIS BY THE AUTOMATED CELLULAR IMAGING SYSTEM (ACIS) THE SMALLER TUMOR SHOWS THE FOLLOWING BREAST PROGNOSTIC PROFILE: Estrogen Receptor (Negative, <1%): 100%,POSITIVE, STRONG STAINING INTENSITY Progesterone Receptor (Negative, <1%): 7%,POSITIVE, STRONG STAINING INTENSITY Proliferation Marker Ki67 by M IB-1 (Low<20%): 50% All controls stained appropriately Abigail Miyamoto MD Pathologist, Electronic Signature ( Signed 12/25/2011) 2. CHROMOGENIC IN-SITU HYBRIDIZATION Interpretation: 0.7 CM SMALLER TUMOR: HER2/NEU BY CISH - SHOWS AMPLIFICATION BY CISH ANALYSIS. THE RATIO OF HER2: CEP 17 SIGNALS WAS 4.94. 1.7 CM LEFT SUPERIOR TUMOR: HER-2/NEU BY CISH - NO AMPLIFICATION OF HER-2 DETECTED. THE RATIO OF HER-2: CEP 17 SIGNALS WAS 1.14. Reference range: Ratio: HER2:CEP17 < 1.8 - gene amplification not  observed Ratio: HER2:CEP 17 1.8-2.2 - equivocal result Ratio: HER2:CEP17 > 2.2 - gene amplification observed 1 of 4 FINAL for Mignogna, Evolette S [phone]) ADDITIONAL INFORMATION:(continued) Comment: The cancer center was notified on 12-24-2011. Abigail Miyamoto MD Pathologist, Electronic Signature ( Signed 12/24/2011) FINAL DIAGNOSIS Diagnosis 1. Lymph node, sentinel, biopsy, Left - METASTATIC CARCINOMA IN 1 OF 1 LYMPH NODE WITH FOCAL CAPSULAR EXTENSION (1/1). 2. Breast, simple mastectomy, Left - INVASIVE DUCTAL CARCINOMA, TWO FOCI, WELL DIFFERENTIATED, SPANNING  1.7 AND 0.7 CM. - DUCTAL CARCINOMA IN SITU. - LYMPHOVASCULAR INVASION IS IDENTIFIED. - THE SURGICAL RESECTION MARGINS ARE NEGATIVE FOR CARCINOMA. - SEE ONCOLOGY TABLE BELOW. 3. Breast, biopsy, left, additional tissue - BENIGN SKIN AND SUBCUTANEOUS TISSUE. - THERE IS NO EVIDENCE OF MALIGNANCY. Microscopic Comment 2. BREAST, INVASIVE TUMOR, WITH LYMPH NODE SAMPLING Specimen, including laterality: Left breast. Procedure: Simple mastectomy Grade: Both are I Tubule formation: 1 and 1 Nuclear pleomorphism: 1 and 2 Mitotic:1 and 1 Tumor size (gross measurement): 1.7 and 0.7 cm Margins: Negative for carcinoma Invasive, distance to closest margin: 2.0 cm to the deep margin (gross measurement) In-situ, distance to closest margin: 2.0 cm from the deep margin (gross measurement). Lymphovascular invasion: Present. Ductal carcinoma in situ: Present. Grade: Intermediate grade. Extensive intraductal component: No. Lobular neoplasia: Not identified. Tumor focality: Two foci. Treatment effect: N/A Extent of tumor: Confined to breast parenchyma Lymph nodes: # examined: 1 Lymph nodes with metastasis: 1 Isolated tumor cells (< 0.2 mm): 0 Micrometastasis: (> 0.2 mm and < 2.0 mm): 0 Macrometastasis: (> 2.0 mm): 1 Extracapsular extension: Present, focal. 2 of 4 FINAL for Carl, Miangel S [phone]) Microscopic  Comment(continued) Breast prognostic profile: [phone] Estrogen receptor: Positive (100%, strong staining intensity). Progesterone receptor: Positive (100%, strong staining intensity. Her 2 neu: No amplification was detected. The ratio was 1.45. Her 2 neu by CISH will be repeated on both tumors and the results reported separately. Ki-67: 13% Non-neoplastic breast: Healing biopsy site. TNM: mpT1c, pN1a Comments: Grossly, there are two foci of grade I invasive ductal carcinoma present in the simple mastectomy. The larger nodule spans 1.7 cm and is histologically identical to the previous core biopsy, [phone] ("left superior"). The second nodule, 0.7 cm, is grade I invasive ductal carcinoma with extracellular mucin. A complete profile will be performed on this second, smaller nodule. (JBK:gt, 12/22/11) Pecola Leisure MD Pathologist, Electronic Signature   RADIOGRAPHIC STUDIES:  No results found.  ASSESSMENT: 79 year old female with:  #1 new diagnosis of stage II invasive ductal carcinoma of the left breast she is status post mastectomy with finding of 2 foci of invasive ductal carcinoma both are ER/PR positive but the smaller focus measuring 0.7 cm is HER-2/neu positive with a ratio over 4.  #2 patient was begun on adjuvant chemotherapy and Herceptin. With chemotherapy consisting of Taxol given weekly with weekly Herceptin. She received her first cycle on 02/08/2012- 10/13.  Patient subsequently could not tolerate the Taxol so therefore this was discontinued.  #3 she was then begun on Herceptin every 3 weeks beginning on 05/02/12.  #3 Anemia due to chemotherapy  #4 Osteopenia  PLAN: 1. Proceed with herceptin and continue daily Femara. She had an appt with Dr. Gala Romney on 4/14 and he cleared her to continue with Herceptin.   2. Return in 3 weeks for her next dose of Herceptin.  3. For the osteopenia, she will take calcium, vitamin d, and she will receive Prolia at her next  appointment.     She knows to call with any questions or concerns before her next appointment.    I spent 25 minutes counseling the patient face to face. The total time spent in the appointment was 30 minutes.  Cherie Ouch Lyn Hollingshead, NP Medical Oncology [facility] Phone: [phone]

## 2012-11-17 NOTE — Patient Instructions (Addendum)
[facility] Discharge Instructions for Patients Receiving Chemotherapy  Today you received the following chemotherapy agents Herceptin.  To help prevent nausea and vomiting after your treatment, we encourage you to take your nausea medication as prescribed.    If you develop nausea and vomiting that is not controlled by your nausea medication, call the clinic. If it is after clinic hours your family physician or the after hours number for the clinic or go to the Emergency Department.   BELOW ARE SYMPTOMS THAT SHOULD BE REPORTED IMMEDIATELY:  *FEVER GREATER THAN 100.5 F  *CHILLS WITH OR WITHOUT FEVER  NAUSEA AND VOMITING THAT IS NOT CONTROLLED WITH YOUR NAUSEA MEDICATION  *UNUSUAL SHORTNESS OF BREATH  *UNUSUAL BRUISING OR BLEEDING  TENDERNESS IN MOUTH AND THROAT WITH OR WITHOUT PRESENCE OF ULCERS  *URINARY PROBLEMS  *BOWEL PROBLEMS  UNUSUAL RASH Items with * indicate a potential emergency and should be followed up as soon as possible.   Please let the nurse know about any problems that you may have experienced. Feel free to call the clinic you have any questions or concerns. The clinic phone number is [phone].   I have been informed and understand all the instructions given to me. I know to contact the clinic, my physician, or go to the Emergency Department if any problems should occur. I do not have any questions at this time, but understand that I may call the clinic during office hours   should I have any questions or need assistance in obtaining follow up care.    __________________________________________  _____________  __________ Signature of Patient or Authorized Representative            Date                   Time    __________________________________________ Nurse's Signature    

## 2012-11-17 NOTE — Patient Instructions (Addendum)
Doing well.  Proceed with Herceptin.  Please call us if you have any questions or concerns.    

## 2012-11-18 LAB — VITAMIN D 25 HYDROXY (VIT D DEFICIENCY, FRACTURES): Vit D, 25-Hydroxy: 10 ng/mL — ABNORMAL LOW (ref 30–89)

## 2012-11-22 ENCOUNTER — Other Ambulatory Visit: Payer: Self-pay | Admitting: *Deleted

## 2012-11-22 DIAGNOSIS — C50412 Malignant neoplasm of upper-outer quadrant of left female breast: Secondary | ICD-10-CM

## 2012-11-22 MED ORDER — GABAPENTIN 100 MG PO CAPS: ORAL_CAPSULE | ORAL | Status: DC

## 2012-12-02 ENCOUNTER — Telehealth (INDEPENDENT_AMBULATORY_CARE_PROVIDER_SITE_OTHER): Payer: Self-pay

## 2012-12-02 NOTE — Telephone Encounter (Signed)
Called pt to check on her from her last office visit with Dr Dwain Sarna since her home life was causing a lot of stress. The pt states that she is doing all right except for some knee pain. The pt thinks she has strained the knee from bending over on it for a long period of time while cleaning. The pt stated the knee is gettting better but slow to heal. I advised pt that if she wanted me to I could make some phone calls at the cancer center to some of the support groups or even check on pt volunteering at the cancer center to help with her stress. The pt said that sounds good but not for right now she wants her knee to get better first. I advised pt that we are here if she needs anything to just call me. The pt understands.

## 2012-12-07 ENCOUNTER — Other Ambulatory Visit (HOSPITAL_BASED_OUTPATIENT_CLINIC_OR_DEPARTMENT_OTHER): Payer: Medicare Other | Admitting: Lab

## 2012-12-07 ENCOUNTER — Ambulatory Visit (HOSPITAL_BASED_OUTPATIENT_CLINIC_OR_DEPARTMENT_OTHER): Payer: Medicare Other | Admitting: Adult Health

## 2012-12-07 ENCOUNTER — Ambulatory Visit (HOSPITAL_BASED_OUTPATIENT_CLINIC_OR_DEPARTMENT_OTHER): Payer: Medicare Other

## 2012-12-07 ENCOUNTER — Encounter: Payer: Self-pay | Admitting: Adult Health

## 2012-12-07 VITALS — BP 129/61 | HR 92 | Temp 98.0°F | Resp 20 | Ht 59.0 in | Wt 160.2 lb

## 2012-12-07 DIAGNOSIS — Z170 Estrogen receptor positive status [ER+]: Secondary | ICD-10-CM

## 2012-12-07 DIAGNOSIS — Z5112 Encounter for antineoplastic immunotherapy: Secondary | ICD-10-CM

## 2012-12-07 DIAGNOSIS — M8580 Other specified disorders of bone density and structure, unspecified site: Secondary | ICD-10-CM

## 2012-12-07 DIAGNOSIS — C50919 Malignant neoplasm of unspecified site of unspecified female breast: Secondary | ICD-10-CM

## 2012-12-07 DIAGNOSIS — M899 Disorder of bone, unspecified: Secondary | ICD-10-CM

## 2012-12-07 DIAGNOSIS — T451X5A Adverse effect of antineoplastic and immunosuppressive drugs, initial encounter: Secondary | ICD-10-CM

## 2012-12-07 DIAGNOSIS — R52 Pain, unspecified: Secondary | ICD-10-CM

## 2012-12-07 DIAGNOSIS — C50412 Malignant neoplasm of upper-outer quadrant of left female breast: Secondary | ICD-10-CM

## 2012-12-07 DIAGNOSIS — Z79899 Other long term (current) drug therapy: Secondary | ICD-10-CM

## 2012-12-07 DIAGNOSIS — C50419 Malignant neoplasm of upper-outer quadrant of unspecified female breast: Secondary | ICD-10-CM

## 2012-12-07 DIAGNOSIS — D6481 Anemia due to antineoplastic chemotherapy: Secondary | ICD-10-CM

## 2012-12-07 DIAGNOSIS — M949 Disorder of cartilage, unspecified: Secondary | ICD-10-CM

## 2012-12-07 LAB — CBC WITH DIFFERENTIAL/PLATELET
BASO%: 0.2 % (ref 0.0–2.0)
EOS%: 2.7 % (ref 0.0–7.0)
HCT: 33.5 % — ABNORMAL LOW (ref 34.8–46.6)
MCH: 27.5 pg (ref 25.1–34.0)
MCHC: 32.5 g/dL (ref 31.5–36.0)
MONO#: 0.7 10*3/uL (ref 0.1–0.9)
RBC: 3.97 10*6/uL (ref 3.70–5.45)
RDW: 15.1 % — ABNORMAL HIGH (ref 11.2–14.5)
WBC: 8.5 10*3/uL (ref 3.9–10.3)
lymph#: 2.2 10*3/uL (ref 0.9–3.3)
nRBC: 0 % (ref 0–0)

## 2012-12-07 LAB — COMPREHENSIVE METABOLIC PANEL (CC13)
ALT: 15 U/L (ref 0–55)
AST: 17 U/L (ref 5–34)
Albumin: 3.3 g/dL — ABNORMAL LOW (ref 3.5–5.0)
Alkaline Phosphatase: 92 U/L (ref 40–150)
BUN: 16.5 mg/dL (ref 7.0–26.0)
Calcium: 9.6 mg/dL (ref 8.4–10.4)
Chloride: 103 mEq/L (ref 98–107)
Potassium: 4.3 mEq/L (ref 3.5–5.1)
Sodium: 138 mEq/L (ref 136–145)
Total Protein: 7.4 g/dL (ref 6.4–8.3)

## 2012-12-07 MED ORDER — SODIUM CHLORIDE 0.9 % IV SOLN
Freq: Once | INTRAVENOUS | Status: AC
  Administered 2012-12-07: 15:00:00 via INTRAVENOUS

## 2012-12-07 MED ORDER — ACETAMINOPHEN 325 MG PO TABS
650.0000 mg | ORAL_TABLET | Freq: Once | ORAL | Status: AC
  Administered 2012-12-07: 650 mg via ORAL

## 2012-12-07 MED ORDER — SODIUM CHLORIDE 0.9 % IJ SOLN
10.0000 mL | INTRAMUSCULAR | Status: DC | PRN
  Administered 2012-12-07: 10 mL
  Filled 2012-12-07: qty 10

## 2012-12-07 MED ORDER — GABAPENTIN 100 MG PO CAPS: ORAL_CAPSULE | ORAL | Status: DC

## 2012-12-07 MED ORDER — DIPHENHYDRAMINE HCL 25 MG PO CAPS
50.0000 mg | ORAL_CAPSULE | Freq: Once | ORAL | Status: AC
  Administered 2012-12-07: 50 mg via ORAL

## 2012-12-07 MED ORDER — TRASTUZUMAB CHEMO INJECTION 440 MG
6.0000 mg/kg | Freq: Once | INTRAVENOUS | Status: AC
  Administered 2012-12-07: 462 mg via INTRAVENOUS
  Filled 2012-12-07: qty 22

## 2012-12-07 MED ORDER — HEPARIN SOD (PORK) LOCK FLUSH 100 UNIT/ML IV SOLN
500.0000 [IU] | Freq: Once | INTRAVENOUS | Status: AC | PRN
  Administered 2012-12-07: 500 [IU]
  Filled 2012-12-07: qty 5

## 2012-12-07 MED ORDER — DENOSUMAB 60 MG/ML ~~LOC~~ SOLN
60.0000 mg | Freq: Once | SUBCUTANEOUS | Status: AC
  Administered 2012-12-07: 60 mg via SUBCUTANEOUS
  Filled 2012-12-07: qty 1

## 2012-12-07 MED ORDER — OXYCODONE HCL 5 MG PO TABS: 5.0000 mg | ORAL_TABLET | ORAL | Status: DC | PRN

## 2012-12-07 NOTE — Patient Instructions (Addendum)
[facility] Discharge Instructions for Patients Receiving Chemotherapy  Today you received the following chemotherapy agents Herceptin/Prolia  To help prevent nausea and vomiting after your treatment, we encourage you to take your nausea medication as prescribed.   If you develop nausea and vomiting that is not controlled by your nausea medication, call the clinic.   BELOW ARE SYMPTOMS THAT SHOULD BE REPORTED IMMEDIATELY:  *FEVER GREATER THAN 100.5 F  *CHILLS WITH OR WITHOUT FEVER  NAUSEA AND VOMITING THAT IS NOT CONTROLLED WITH YOUR NAUSEA MEDICATION  *UNUSUAL SHORTNESS OF BREATH  *UNUSUAL BRUISING OR BLEEDING  TENDERNESS IN MOUTH AND THROAT WITH OR WITHOUT PRESENCE OF ULCERS  *URINARY PROBLEMS  *BOWEL PROBLEMS  UNUSUAL RASH Items with * indicate a potential emergency and should be followed up as soon as possible.  Feel free to call the clinic you have any questions or concerns. The clinic phone number is [phone].

## 2012-12-07 NOTE — Progress Notes (Signed)
OFFICE PROGRESS NOTE  CC  [name], MD [address] [place] [address] Dr. Emelia Loron Dr. Antony Blackbird  DIAGNOSIS:  79 year old female with new diagnosis of multifocal breast cancer of the left breast. Patient is status post mastectomy with sentinel lymph node biopsy performed on 12/18/2011.   PRIOR THERAPY: 1.Patient has had multiple biopsies performed including endoscopic ultrasound guided biopsies as well as an open biopsy without a definitive diagnosis  #2 patient now with new diagnosis of multifocal breast cancer of the left breast. She underwent a mammogram after she felt a mass in the left breast. The mammogram showed an abnormality. As a biopsy was performed and she was found to have an ace invasive ductal carcinoma. She has now had a mastectomy on 12/18/2011. The final pathology revealed 2 foci of well-differentiated invasive ductal carcinoma one measuring 1.7 cm and the second measuring 0.7 cm there was ductal carcinoma in situ lymphovascular invasion was also identified all surgical margins were negative for carcinoma. Patient went on to also have a left sentinel node biopsy one sentinel node was positive for metastatic disease with focal capsular extension. The tumor was estrogen receptor positive progesterone receptor positive. The largest tumor measuring 1.7 cm was HER-2/neu negative with a Ki-67 of 13%. The smaller tumor showed ER +100% PR receptor 7% proliferation marker 50% and it was HER-2/neu positive with a ratio 4.94.  #3 patient has been seen by radiation oncology and post mastectomy radiation therapy is recommended. At this time she will not get a left axillary lymph node dissection.  #4 patient's tumor is HER-2/neu positive and does she will receive adjuvant chemotherapy and Herceptin based therapy.  #5 patient will proceed with combination chemotherapy consisting of Taxol and Herceptin starting on 02/09/2012. A total of 12 weeks of combination  treatment is planned. Once she completes this she will undergo radiation therapy. After which she will receive Herceptin for one year with 5 years of letrozole 2.5 mg daily.  CURRENT THERAPY: Q 3 week herceptin and Letrozole daily.    INTERVAL HISTORY: Brandy [name] 79 y.o. female returns for followup visit today for evaluation prior to Herceptin therapy.  She continues to take Letrozole and does have some joint aches, but is otherwise continuing to tolerate it well. She hurt her knee last week while cleaning.  She is still walking without much difficulty.  She denies swelling in the joint.  She denies fevers, chills, nausea, vomiting, constipation, diarrhea, shortness of breath, or any other concerns.    MEDICAL HISTORY: Past Medical History  Diagnosis Date  . Asthma   . Hyperlipidemia     takes Simvastatin daily  . Hypertension     takes Amlodipine and Diovan daily  . Cancer     left breast  . Bronchitis     hx of;last time >91yr ago  . Arthritis     shoulders  . Dysphagia   . H/O hiatal hernia   . GERD (gastroesophageal reflux disease)     takes Prilosec prn  . Urinary urgency   . History of kidney stones     many yrs ago  . Depression     takes Celexa daily  . Insomnia     takes Ambien nightly  . Breast cancer 12/18/11    left breast masectomy=metastatic ca in (1/1) lymph node ,invasive ductal ca,2 foci,,dcis,lymph ovascular invasion identified,surgical resection margins neg for ca,additional tissue=benign skin and subcutaneous tissue  . Breast CA 11/23/11    left breast  3 o'clock bx=high grade ductal ca in situ w/comedononecrosis and calcification,dcis,lymphovascular invasion present ,ER?PR+positive  . Full dentures   . Hx of radiation therapy 04/26/12 -06/10/12    left breast    ALLERGIES:  has No Known Allergies.  MEDICATIONS:  Current Outpatient Prescriptions  Medication Sig Dispense Refill  . amLODipine (NORVASC) 10 MG tablet Take 10 mg by mouth daily.       .  citalopram (CELEXA) 20 MG tablet Take 20 mg by mouth daily.      Marland Kitchen DIOVAN HCT 160-12.5 MG per tablet Take 2 tablets by mouth every morning.       . gabapentin (NEURONTIN) 100 MG capsule TAKE 3 CAPSULES AT BEDTIME  90 capsule  0  . letrozole (FEMARA) 2.5 MG tablet Take 1 tablet (2.5 mg total) by mouth daily.  90 tablet  12  . lidocaine-prilocaine (EMLA) cream Apply topically as needed.  30 g  4  . omeprazole (PRILOSEC) 20 MG capsule Take 20 mg by mouth daily.      Marland Kitchen oxyCODONE (OXY IR/ROXICODONE) 5 MG immediate release tablet Take 1 tablet (5 mg total) by mouth every 4 (four) hours as needed for pain.  90 tablet  0  . potassium chloride SA (K-DUR,KLOR-CON) 20 MEQ tablet Take 1 tablet (20 mEq total) by mouth 2 (two) times daily.  20 tablet  0  . simvastatin (ZOCOR) 20 MG tablet Take 20 mg by mouth daily.       Marland Kitchen spironolactone (ALDACTONE) 25 MG tablet Take 0.5 tablets (12.5 mg total) by mouth daily.  30 tablet  3  . sulfamethoxazole-trimethoprim (BACTRIM DS) 800-160 MG per tablet       . zolpidem (AMBIEN) 10 MG tablet Take 10 mg by mouth at bedtime.       No current facility-administered medications for this visit.   Facility-Administered Medications Ordered in Other Visits  Medication Dose Route Frequency Provider Last Rate Last Dose  . sodium chloride 0.9 % injection 10 mL  10 mL Intracatheter PRN Victorino December, MD   10 mL at 03/14/12 1628    SURGICAL HISTORY:  Past Surgical History  Procedure Laterality Date  . Breast biopsy  1998    left  . Total shoulder replacement  2011    left  . Eus  06/04/2011    Procedure: UPPER ENDOSCOPIC ULTRASOUND (EUS) LINEAR;  Surgeon: Rob Bunting, MD;  Location: [facility];  Service: Endoscopy;  Laterality: N/A;  . Exploratory laparotomy       biopsy of intra-abdominal mass  . Bladder tack    . Tubal ligation    . Appendectomy    . Dilation and curettage of uterus    . Esophagogastroduodenoscopy    . Mastectomy w/ sentinel node biopsy  12/18/2011     Procedure: MASTECTOMY WITH SENTINEL LYMPH NODE BIOPSY;  Surgeon: Emelia Loron, MD;  Location: [facility];  Service: General;  Laterality: Left;  . Portacath placement  01/27/2012    Procedure: INSERTION PORT-A-CATH;  Surgeon: Emelia Loron, MD;  Location: [facility];  Service: General;  Laterality: Right;  PORT PLACEMENT    REVIEW OF SYSTEMS:   General: fatigue (+), night sweats (-), fever (-), pain (-) Lymph: palpable nodes (-) HEENT: vision changes (-), mucositis (-), gum bleeding (-), epistaxis (-) Cardiovascular: chest pain (-), palpitations (-) Pulmonary: shortness of breath (-), dyspnea on exertion (-), cough (-), hemoptysis (-) GI:  Early satiety (-), melena (-), dysphagia (-), nausea/vomiting (-), diarrhea (-) GU: dysuria (-),  hematuria (-), incontinence (-) Musculoskeletal: joint swelling (-), joint pain (-), back pain (-) Neuro: weakness (-), numbness (-), headache (-), confusion (-) Skin: Rash (-), lesions (-), dryness (-) Psych: depression (-), suicidal/homicidal ideation (-), feeling of hopelessness (-)  Health Maintenance  Mammogram: 11/2011 Colonoscopy: Never Bone Density Scan: 10/28/12 Pap Smear: Years Eye Exam: 2 years ago Vitamin D Level: 1/14 Lipid Panel: 2011   PHYSICAL EXAMINATION:  BP 129/61  Pulse 92  Temp(Src) 98 F (36.7 C) (Oral)  Resp 20  Ht 4\' 11"  (1.499 m)  Wt 160 lb 4 oz (72.689 kg)  BMI 32.35 kg/m2 General: Patient is an elderly appearing female in no acute distress HEENT: PERRLA, sclerae anicteric no conjunctival pallor, MMM Neck: supple, no palpable adenopathy Lungs: clear to auscultation bilaterally, no wheezes, rhonchi, or rales Cardiovascular: regular rate rhythm, S1, S2, systolic murmur, rubs or gallops Abdomen: Soft, non-tender, non-distended, normoactive bowel sounds, no HSM Extremities: warm and well perfused, no clubbing, cyanosis, or edema Skin: No rash or lesions, previous shingles and superinfection have  healed. Neuro: Non-focal Breast: Left mastectomy no nodularity, or local sign of recurrence.  Right breast is without masses ECOG PERFORMANCE STATUS: 1 - Symptomatic but completely ambulatory  LABORATORY DATA: Lab Results  Component Value Date   WBC 8.5 12/07/2012   HGB 10.9* 12/07/2012   HCT 33.5* 12/07/2012   MCV 84.4 12/07/2012   PLT 283 12/07/2012      Chemistry      Component Value Date/Time   NA 138 11/17/2012 1333   NA 139 02/22/2012 1148   K 4.1 11/17/2012 1333   K 4.0 02/22/2012 1148   CL 105 11/17/2012 1333   CL 106 02/22/2012 1148   CO2 23 11/17/2012 1333   CO2 27 02/22/2012 1148   BUN 18.2 11/17/2012 1333   BUN 15 02/22/2012 1148   CREATININE 0.7 11/17/2012 1333   CREATININE 0.52 02/22/2012 1148      Component Value Date/Time   CALCIUM 9.9 11/17/2012 1333   CALCIUM 8.6 02/22/2012 1148   ALKPHOS 87 11/17/2012 1333   ALKPHOS 59 02/22/2012 1148   AST 18 11/17/2012 1333   AST 21 02/22/2012 1148   ALT 18 11/17/2012 1333   ALT 30 02/22/2012 1148   BILITOT 0.32 11/17/2012 1333   BILITOT 0.2* 02/22/2012 1148    ADDITIONAL INFORMATION: 2. PROGNOSTIC INDICATORS - ACIS Results IMMUNOHISTOCHEMICAL AND MORPHOMETRIC ANALYSIS BY THE AUTOMATED CELLULAR IMAGING SYSTEM (ACIS) THE SMALLER TUMOR SHOWS THE FOLLOWING BREAST PROGNOSTIC PROFILE: Estrogen Receptor (Negative, <1%): 100%,POSITIVE, STRONG STAINING INTENSITY Progesterone Receptor (Negative, <1%): 7%,POSITIVE, STRONG STAINING INTENSITY Proliferation Marker Ki67 by M IB-1 (Low<20%): 50% All controls stained appropriately Abigail Miyamoto MD Pathologist, Electronic Signature ( Signed 12/25/2011) 2. CHROMOGENIC IN-SITU HYBRIDIZATION Interpretation: 0.7 CM SMALLER TUMOR: HER2/NEU BY CISH - SHOWS AMPLIFICATION BY CISH ANALYSIS. THE RATIO OF HER2: CEP 17 SIGNALS WAS 4.94. 1.7 CM LEFT SUPERIOR TUMOR: HER-2/NEU BY CISH - NO AMPLIFICATION OF HER-2 DETECTED. THE RATIO OF HER-2: CEP 17 SIGNALS WAS 1.14. Reference range: Ratio: HER2:CEP17 < 1.8 -  gene amplification not observed Ratio: HER2:CEP 17 1.8-2.2 - equivocal result Ratio: HER2:CEP17 > 2.2 - gene amplification observed 1 of 4 FINAL for Altamirano, Elgie S [phone]) ADDITIONAL INFORMATION:(continued) Comment: The cancer center was notified on 12-24-2011. Abigail Miyamoto MD Pathologist, Electronic Signature ( Signed 12/24/2011) FINAL DIAGNOSIS Diagnosis 1. Lymph node, sentinel, biopsy, Left - METASTATIC CARCINOMA IN 1 OF 1 LYMPH NODE WITH FOCAL CAPSULAR EXTENSION (1/1). 2. Breast, simple mastectomy, Left -  INVASIVE DUCTAL CARCINOMA, TWO FOCI, WELL DIFFERENTIATED, SPANNING 1.7 AND 0.7 CM. - DUCTAL CARCINOMA IN SITU. - LYMPHOVASCULAR INVASION IS IDENTIFIED. - THE SURGICAL RESECTION MARGINS ARE NEGATIVE FOR CARCINOMA. - SEE ONCOLOGY TABLE BELOW. 3. Breast, biopsy, left, additional tissue - BENIGN SKIN AND SUBCUTANEOUS TISSUE. - THERE IS NO EVIDENCE OF MALIGNANCY. Microscopic Comment 2. BREAST, INVASIVE TUMOR, WITH LYMPH NODE SAMPLING Specimen, including laterality: Left breast. Procedure: Simple mastectomy Grade: Both are I Tubule formation: 1 and 1 Nuclear pleomorphism: 1 and 2 Mitotic:1 and 1 Tumor size (gross measurement): 1.7 and 0.7 cm Margins: Negative for carcinoma Invasive, distance to closest margin: 2.0 cm to the deep margin (gross measurement) In-situ, distance to closest margin: 2.0 cm from the deep margin (gross measurement). Lymphovascular invasion: Present. Ductal carcinoma in situ: Present. Grade: Intermediate grade. Extensive intraductal component: No. Lobular neoplasia: Not identified. Tumor focality: Two foci. Treatment effect: N/A Extent of tumor: Confined to breast parenchyma Lymph nodes: # examined: 1 Lymph nodes with metastasis: 1 Isolated tumor cells (< 0.2 mm): 0 Micrometastasis: (> 0.2 mm and < 2.0 mm): 0 Macrometastasis: (> 2.0 mm): 1 Extracapsular extension: Present, focal. 2 of 4 FINAL for Smotherman, Ortha S  [phone]) Microscopic Comment(continued) Breast prognostic profile: [phone] Estrogen receptor: Positive (100%, strong staining intensity). Progesterone receptor: Positive (100%, strong staining intensity. Her 2 neu: No amplification was detected. The ratio was 1.45. Her 2 neu by CISH will be repeated on both tumors and the results reported separately. Ki-67: 13% Non-neoplastic breast: Healing biopsy site. TNM: mpT1c, pN1a Comments: Grossly, there are two foci of grade I invasive ductal carcinoma present in the simple mastectomy. The larger nodule spans 1.7 cm and is histologically identical to the previous core biopsy, [phone] ("left superior"). The second nodule, 0.7 cm, is grade I invasive ductal carcinoma with extracellular mucin. A complete profile will be performed on this second, smaller nodule. (JBK:gt, 12/22/11) Pecola Leisure MD Pathologist, Electronic Signature   RADIOGRAPHIC STUDIES:  No results found.  ASSESSMENT: 79 year old female with:  #1 new diagnosis of stage II invasive ductal carcinoma of the left breast she is status post mastectomy with finding of 2 foci of invasive ductal carcinoma both are ER/PR positive but the smaller focus measuring 0.7 cm is HER-2/neu positive with a ratio over 4.  #2 patient was begun on adjuvant chemotherapy and Herceptin. With chemotherapy consisting of Taxol given weekly with weekly Herceptin. She received her first cycle on 02/08/2012- 10/13.  Patient subsequently could not tolerate the Taxol so therefore this was discontinued.  #3 she was then begun on Herceptin every 3 weeks beginning on 05/02/12.  #3 Anemia due to chemotherapy  #4 Osteopenia  PLAN: 1. Proceed with herceptin and continue daily Femara. She had an appt with Dr. Gala Romney on 4/14 and he cleared her to continue with Herceptin.   2. Return in 3 weeks for her next dose of Herceptin.  3. For the osteopenia, she will take calcium, vitamin d, and she will  receive Prolia today.  4. I have ordered another mammo and requested it be scheduled.  I encouraged the patient to keep her mammo appt.   5. I recommended she see her PCP or ortho about her knee injury, particularly if it worsens.     She knows to call with any questions or concerns before her next appointment.    I spent 25 minutes counseling the patient face to face. The total time spent in the appointment was 30 minutes.  Cherie Ouch Lyn Hollingshead, NP Medical  Oncology [facility] Phone: [phone]

## 2012-12-07 NOTE — Patient Instructions (Addendum)
Denosumab injection What is this medicine? DENOSUMAB slows bone breakdown. It is used to treat osteoporosis in women after menopause and in men. This medicine is also used to prevent bone fractures and other bone problems caused by cancer bone metastases. This medicine may be used for other purposes; ask your health care provider or pharmacist if you have questions. What should I tell my health care provider before I take this medicine? They need to know if you have any of these conditions: -dental disease -eczema -infection or history of infections -kidney disease or on dialysis -low blood calcium or vitamin D -malabsorption syndrome -scheduled to have surgery or tooth extraction -taking medicine that contains denosumab -thyroid or parathyroid disease -an unusual reaction to denosumab, other medicines, foods, dyes, or preservatives -pregnant or trying to get pregnant -breast-feeding How should I use this medicine? This medicine is for injection under the skin. It is given by a health care professional in a hospital or clinic setting. If you are getting Prolia, a special MedGuide will be given to you by the pharmacist with each prescription and refill. Be sure to read this information carefully each time. Talk to your pediatrician regarding the use of this medicine in children. Special care may be needed. Overdosage: If you think you've taken too much of this medicine contact a poison control center or emergency room at once. Overdosage: If you think you have taken too much of this medicine contact a poison control center or emergency room at once. NOTE: This medicine is only for you. Do not share this medicine with others. What if I miss a dose? It is important not to miss your dose. Call your doctor or health care professional if you are unable to keep an appointment. What may interact with this medicine? Do not take this medicine with any of the following medications: -other medicines  containing denosumab This medicine may also interact with the following medications: -medicines that suppress the immune system -medicines that treat cancer -steroid medicines like prednisone or cortisone This list may not describe all possible interactions. Give your health care provider a list of all the medicines, herbs, non-prescription drugs, or dietary supplements you use. Also tell them if you smoke, drink alcohol, or use illegal drugs. Some items may interact with your medicine. What should I watch for while using this medicine? Visit your doctor or health care professional for regular checks on your progress. Your doctor or health care professional may order blood tests and other tests to see how you are doing. Call your doctor or health care professional if you get a cold or other infection while receiving this medicine. Do not treat yourself. This medicine may decrease your body's ability to fight infection. You should make sure you get enough calcium and vitamin D while you are taking this medicine, unless your doctor tells you not to. Discuss the foods you eat and the vitamins you take with your health care professional. See your dentist regularly. Brush and floss your teeth as directed. Before you have any dental work done, tell your dentist you are receiving this medicine. What side effects may I notice from receiving this medicine? Side effects that you should report to your doctor or health care professional as soon as possible: -allergic reactions like skin rash, itching or hives, swelling of the face, lips, or tongue -breathing problems -chest pain -fast, irregular heartbeat -feeling faint or lightheaded, falls -fever, chills, or any other sign of infection -muscle spasms, tightening, or twitches -numbness   or tingling -skin blisters or bumps, or is dry, peels, or red -slow healing or unexplained pain in the mouth or jaw -unusual bleeding or bruising Side effects that  usually do not require medical attention (Report these to your doctor or health care professional if they continue or are bothersome.): -muscle pain -stomach upset, gas This list may not describe all possible side effects. Call your doctor for medical advice about side effects. You may report side effects to FDA at 1-800-FDA-1088. Where should I keep my medicine? This medicine is only given in a clinic, doctor's office, or other health care setting and will not be stored at home. NOTE: This sheet is a summary. It may not cover all possible information. If you have questions about this medicine, talk to your doctor, pharmacist, or health care provider.  2013, [employer]. (03/31/2011 3:40:41 PM)  

## 2012-12-13 ENCOUNTER — Other Ambulatory Visit: Payer: Self-pay | Admitting: Emergency Medicine

## 2012-12-13 MED ORDER — ERGOCALCIFEROL 1.25 MG (50000 UT) PO CAPS: 50000.0000 [IU] | ORAL_CAPSULE | ORAL | Status: DC

## 2012-12-28 ENCOUNTER — Ambulatory Visit (HOSPITAL_BASED_OUTPATIENT_CLINIC_OR_DEPARTMENT_OTHER): Payer: Medicare Other | Admitting: Adult Health

## 2012-12-28 ENCOUNTER — Ambulatory Visit (HOSPITAL_BASED_OUTPATIENT_CLINIC_OR_DEPARTMENT_OTHER): Payer: Medicare Other

## 2012-12-28 ENCOUNTER — Telehealth: Payer: Self-pay | Admitting: *Deleted

## 2012-12-28 ENCOUNTER — Encounter: Payer: Self-pay | Admitting: Specialist

## 2012-12-28 ENCOUNTER — Other Ambulatory Visit (HOSPITAL_BASED_OUTPATIENT_CLINIC_OR_DEPARTMENT_OTHER): Payer: Medicare Other | Admitting: Lab

## 2012-12-28 ENCOUNTER — Telehealth: Payer: Self-pay | Admitting: Oncology

## 2012-12-28 ENCOUNTER — Encounter: Payer: Self-pay | Admitting: Adult Health

## 2012-12-28 VITALS — BP 188/76 | HR 88 | Temp 98.1°F | Resp 20 | Ht 59.0 in | Wt 158.1 lb

## 2012-12-28 DIAGNOSIS — C50919 Malignant neoplasm of unspecified site of unspecified female breast: Secondary | ICD-10-CM

## 2012-12-28 DIAGNOSIS — C50419 Malignant neoplasm of upper-outer quadrant of unspecified female breast: Secondary | ICD-10-CM

## 2012-12-28 DIAGNOSIS — Z79899 Other long term (current) drug therapy: Secondary | ICD-10-CM

## 2012-12-28 DIAGNOSIS — T451X5A Adverse effect of antineoplastic and immunosuppressive drugs, initial encounter: Secondary | ICD-10-CM

## 2012-12-28 DIAGNOSIS — M899 Disorder of bone, unspecified: Secondary | ICD-10-CM

## 2012-12-28 DIAGNOSIS — C50412 Malignant neoplasm of upper-outer quadrant of left female breast: Secondary | ICD-10-CM

## 2012-12-28 DIAGNOSIS — Z5112 Encounter for antineoplastic immunotherapy: Secondary | ICD-10-CM

## 2012-12-28 DIAGNOSIS — D6481 Anemia due to antineoplastic chemotherapy: Secondary | ICD-10-CM

## 2012-12-28 DIAGNOSIS — M949 Disorder of cartilage, unspecified: Secondary | ICD-10-CM

## 2012-12-28 DIAGNOSIS — Z170 Estrogen receptor positive status [ER+]: Secondary | ICD-10-CM

## 2012-12-28 LAB — CBC WITH DIFFERENTIAL/PLATELET
BASO%: 0.4 % (ref 0.0–2.0)
Basophils Absolute: 0 10*3/uL (ref 0.0–0.1)
EOS%: 1.4 % (ref 0.0–7.0)
HGB: 11.5 g/dL — ABNORMAL LOW (ref 11.6–15.9)
MCH: 27.4 pg (ref 25.1–34.0)
MCHC: 32.3 g/dL (ref 31.5–36.0)
MCV: 84.8 fL (ref 79.5–101.0)
MONO%: 7.4 % (ref 0.0–14.0)
RDW: 15.4 % — ABNORMAL HIGH (ref 11.2–14.5)
lymph#: 2.1 10*3/uL (ref 0.9–3.3)

## 2012-12-28 LAB — COMPREHENSIVE METABOLIC PANEL (CC13)
ALT: 13 U/L (ref 0–55)
AST: 16 U/L (ref 5–34)
Albumin: 3.5 g/dL (ref 3.5–5.0)
Alkaline Phosphatase: 91 U/L (ref 40–150)
BUN: 16 mg/dL (ref 7.0–26.0)
Creatinine: 0.8 mg/dL (ref 0.6–1.1)
Potassium: 3.9 mEq/L (ref 3.5–5.1)

## 2012-12-28 MED ORDER — TRASTUZUMAB CHEMO INJECTION 440 MG
6.0000 mg/kg | Freq: Once | INTRAVENOUS | Status: AC
  Administered 2012-12-28: 462 mg via INTRAVENOUS
  Filled 2012-12-28: qty 22

## 2012-12-28 MED ORDER — SODIUM CHLORIDE 0.9 % IJ SOLN
10.0000 mL | INTRAMUSCULAR | Status: DC | PRN
  Filled 2012-12-28: qty 10

## 2012-12-28 MED ORDER — ACETAMINOPHEN 325 MG PO TABS
650.0000 mg | ORAL_TABLET | Freq: Once | ORAL | Status: AC
  Administered 2012-12-28: 650 mg via ORAL

## 2012-12-28 MED ORDER — DIPHENHYDRAMINE HCL 25 MG PO CAPS
50.0000 mg | ORAL_CAPSULE | Freq: Once | ORAL | Status: AC
  Administered 2012-12-28: 50 mg via ORAL

## 2012-12-28 MED ORDER — SODIUM CHLORIDE 0.9 % IV SOLN
Freq: Once | INTRAVENOUS | Status: AC
  Administered 2012-12-28: 15:00:00 via INTRAVENOUS

## 2012-12-28 MED ORDER — HEPARIN SOD (PORK) LOCK FLUSH 100 UNIT/ML IV SOLN
500.0000 [IU] | Freq: Once | INTRAVENOUS | Status: DC | PRN
  Filled 2012-12-28: qty 5

## 2012-12-28 NOTE — Telephone Encounter (Signed)
Per staff message and POF I have scheduled appts.  JMW  

## 2012-12-28 NOTE — Progress Notes (Signed)
I was asked by the infusion nurse to visit with Brandy [name], who was receiving treatment today.  She was a delightful lady, who expressed a sense of being somewhat alone in her cancer journey, even though she has a husband and children nearby.  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  She said she is frequently sad and cries.  I listened for about 45 minutes and then offered to arrange for her to see one of our counselors.  At this time she is not willing to consider counseling.  She did say she is on an anti-depressant medication, prescribed by her primary care physician.  I encouraged her to report to her physician that she, in her words, feels depressed and that perhaps the medication could be reviewed.  Brandy [name] did express hope that when she completes her treatment she will feel better and that she can begin to have more interests and activities in her life again.

## 2012-12-28 NOTE — Patient Instructions (Signed)
Doing well.  Proceed with Herceptin.  Take the Vitamin D 50,000 units weekly until complete.  Then start taking Vitamin D3 2000 IU daily.  You can get that over the counter.  This well help with bone strength due to your osteopenia.  We will see you back in 3 weeks.  Please call us if you have any questions or concerns.

## 2012-12-28 NOTE — Patient Instructions (Addendum)
[facility] Discharge Instructions for Patients Receiving Chemotherapy  Today you received the following chemotherapy agents: herceptin  To help prevent nausea and vomiting after your treatment, we encourage you to take your nausea medication.  Take it as often as prescribed.     If you develop nausea and vomiting that is not controlled by your nausea medication, call the clinic. If it is after clinic hours your family physician or the after hours number for the clinic or go to the Emergency Department.   BELOW ARE SYMPTOMS THAT SHOULD BE REPORTED IMMEDIATELY:  *FEVER GREATER THAN 100.5 F  *CHILLS WITH OR WITHOUT FEVER  NAUSEA AND VOMITING THAT IS NOT CONTROLLED WITH YOUR NAUSEA MEDICATION  *UNUSUAL SHORTNESS OF BREATH  *UNUSUAL BRUISING OR BLEEDING  TENDERNESS IN MOUTH AND THROAT WITH OR WITHOUT PRESENCE OF ULCERS  *URINARY PROBLEMS  *BOWEL PROBLEMS  UNUSUAL RASH Items with * indicate a potential emergency and should be followed up as soon as possible.  Feel free to call the clinic you have any questions or concerns. The clinic phone number is [phone].   I have been informed and understand all the instructions given to me. I know to contact the clinic, my physician, or go to the Emergency Department if any problems should occur. I do not have any questions at this time, but understand that I may call the clinic during office hours   should I have any questions or need assistance in obtaining follow up care.    __________________________________________  _____________  __________ Signature of Patient or Authorized Representative            Date                   Time    __________________________________________ Nurse's Signature    

## 2012-12-28 NOTE — Progress Notes (Signed)
OFFICE PROGRESS NOTE  CC  [name], MD [address] [place] [address] Dr. Emelia Loron Dr. Antony Blackbird  DIAGNOSIS:  79 year old female with new diagnosis of multifocal breast cancer of the left breast. Patient is status post mastectomy with sentinel lymph node biopsy performed on 12/18/2011.   PRIOR THERAPY: 1.Patient has had multiple biopsies performed including endoscopic ultrasound guided biopsies as well as an open biopsy without a definitive diagnosis  #2 patient now with new diagnosis of multifocal breast cancer of the left breast. She underwent a mammogram after she felt a mass in the left breast. The mammogram showed an abnormality. As a biopsy was performed and she was found to have an ace invasive ductal carcinoma. She has now had a mastectomy on 12/18/2011. The final pathology revealed 2 foci of well-differentiated invasive ductal carcinoma one measuring 1.7 cm and the second measuring 0.7 cm there was ductal carcinoma in situ lymphovascular invasion was also identified all surgical margins were negative for carcinoma. Patient went on to also have a left sentinel node biopsy one sentinel node was positive for metastatic disease with focal capsular extension. The tumor was estrogen receptor positive progesterone receptor positive. The largest tumor measuring 1.7 cm was HER-2/neu negative with a Ki-67 of 13%. The smaller tumor showed ER +100% PR receptor 7% proliferation marker 50% and it was HER-2/neu positive with a ratio 4.94.  #3 patient has been seen by radiation oncology and post mastectomy radiation therapy is recommended. At this time she will not get a left axillary lymph node dissection.  #4 patient's tumor is HER-2/neu positive and does she will receive adjuvant chemotherapy and Herceptin based therapy.  #5 patient will proceed with combination chemotherapy consisting of Taxol and Herceptin starting on 02/09/2012. A total of 12 weeks of combination  treatment is planned. Once she completes this she will undergo radiation therapy. After which she will receive Herceptin for one year with 5 years of letrozole 2.5 mg daily.  CURRENT THERAPY: Q 3 week herceptin and Letrozole daily.    INTERVAL HISTORY: Cynthis Purington Eisenberger 79 y.o. female returns for followup visit today for evaluation prior to Herceptin therapy.  She continues to take Letrozole and does have some joint aches, but is otherwise continuing to tolerate it well. At her last appointment she had hurt her knee while cleaning.  I recommended she have it evaluated, but she has yet to do that.  Her blood pressure is elevated today, and she said that she has run out of her Diovan-Hct and hasn't had it refilled yet.  She is otherwise doing well and a 10 point ROS is negative.    MEDICAL HISTORY: Past Medical History  Diagnosis Date  . Asthma   . Hyperlipidemia     takes Simvastatin daily  . Hypertension     takes Amlodipine and Diovan daily  . Cancer     left breast  . Bronchitis     hx of;last time >58yr ago  . Arthritis     shoulders  . Dysphagia   . H/O hiatal hernia   . GERD (gastroesophageal reflux disease)     takes Prilosec prn  . Urinary urgency   . History of kidney stones     many yrs ago  . Depression     takes Celexa daily  . Insomnia     takes Ambien nightly  . Breast cancer 12/18/11    left breast masectomy=metastatic ca in (1/1) lymph node ,invasive ductal ca,2 foci,,dcis,lymph  ovascular invasion identified,surgical resection margins neg for ca,additional tissue=benign skin and subcutaneous tissue  . Breast CA 11/23/11    left breast 3 o'clock bx=high grade ductal ca in situ w/comedononecrosis and calcification,dcis,lymphovascular invasion present ,ER?PR+positive  . Full dentures   . Hx of radiation therapy 04/26/12 -06/10/12    left breast    ALLERGIES:  has No Known Allergies.  MEDICATIONS:  Current Outpatient Prescriptions  Medication Sig Dispense Refill  .  amLODipine (NORVASC) 10 MG tablet Take 10 mg by mouth daily.       . Calcium Carbonate-Vitamin D (CALCIUM + D PO) Take by mouth daily.      . citalopram (CELEXA) 20 MG tablet Take 20 mg by mouth daily.      Marland Kitchen DIOVAN HCT 160-12.5 MG per tablet Take 2 tablets by mouth every morning.       . ergocalciferol (VITAMIN D2) 50000 UNITS capsule Take 1 capsule (50,000 Units total) by mouth once a week.  4 capsule  0  . gabapentin (NEURONTIN) 100 MG capsule TAKE 3 CAPSULES AT BEDTIME  90 capsule  0  . letrozole (FEMARA) 2.5 MG tablet Take 1 tablet (2.5 mg total) by mouth daily.  90 tablet  12  . lidocaine-prilocaine (EMLA) cream Apply topically as needed.  30 g  4  . omeprazole (PRILOSEC) 20 MG capsule Take 20 mg by mouth daily.      Marland Kitchen oxyCODONE (OXY IR/ROXICODONE) 5 MG immediate release tablet Take 1 tablet (5 mg total) by mouth every 4 (four) hours as needed for pain.  90 tablet  0  . potassium chloride SA (K-DUR,KLOR-CON) 20 MEQ tablet Take 1 tablet (20 mEq total) by mouth 2 (two) times daily.  20 tablet  0  . simvastatin (ZOCOR) 20 MG tablet Take 20 mg by mouth daily.       Marland Kitchen spironolactone (ALDACTONE) 25 MG tablet Take 0.5 tablets (12.5 mg total) by mouth daily.  30 tablet  3  . sulfamethoxazole-trimethoprim (BACTRIM DS) 800-160 MG per tablet       . zolpidem (AMBIEN) 10 MG tablet Take 10 mg by mouth at bedtime.       No current facility-administered medications for this visit.   Facility-Administered Medications Ordered in Other Visits  Medication Dose Route Frequency Provider Last Rate Last Dose  . sodium chloride 0.9 % injection 10 mL  10 mL Intracatheter PRN Victorino December, MD   10 mL at 03/14/12 1628    SURGICAL HISTORY:  Past Surgical History  Procedure Laterality Date  . Breast biopsy  1998    left  . Total shoulder replacement  2011    left  . Eus  06/04/2011    Procedure: UPPER ENDOSCOPIC ULTRASOUND (EUS) LINEAR;  Surgeon: Rob Bunting, MD;  Location: [facility];  Service:  Endoscopy;  Laterality: N/A;  . Exploratory laparotomy       biopsy of intra-abdominal mass  . Bladder tack    . Tubal ligation    . Appendectomy    . Dilation and curettage of uterus    . Esophagogastroduodenoscopy    . Mastectomy w/ sentinel node biopsy  12/18/2011    Procedure: MASTECTOMY WITH SENTINEL LYMPH NODE BIOPSY;  Surgeon: Emelia Loron, MD;  Location: [facility] OR;  Service: General;  Laterality: Left;  . Portacath placement  01/27/2012    Procedure: INSERTION PORT-A-CATH;  Surgeon: Emelia Loron, MD;  Location: [facility];  Service: General;  Laterality: Right;  PORT PLACEMENT    REVIEW  OF SYSTEMS:   General: fatigue (+), night sweats (-), fever (-), pain (-) Lymph: palpable nodes (-) HEENT: vision changes (-), mucositis (-), gum bleeding (-), epistaxis (-) Cardiovascular: chest pain (-), palpitations (-) Pulmonary: shortness of breath (-), dyspnea on exertion (-), cough (-), hemoptysis (-) GI:  Early satiety (-), melena (-), dysphagia (-), nausea/vomiting (-), diarrhea (-) GU: dysuria (-), hematuria (-), incontinence (-) Musculoskeletal: joint swelling (-), joint pain (+), back pain (-) Neuro: weakness (-), numbness (-), headache (-), confusion (-) Skin: Rash (-), lesions (-), dryness (-) Psych: depression (-), suicidal/homicidal ideation (-), feeling of hopelessness (-)  Health Maintenance  Mammogram: 11/2011 Colonoscopy: Never Bone Density Scan: 10/28/12 Pap Smear: Years Eye Exam: 2 years ago Vitamin D Level: 1/14 Lipid Panel: 2011   PHYSICAL EXAMINATION:  BP 188/76  Pulse 88  Temp(Src) 98.1 F (36.7 C) (Oral)  Resp 20  Ht 4\' 11"  (1.499 m)  Wt 158 lb 1.6 oz (71.714 kg)  BMI 31.92 kg/m2 General: Patient is an elderly appearing female in no acute distress HEENT: PERRLA, sclerae anicteric no conjunctival pallor, MMM Neck: supple, no palpable adenopathy Lungs: clear to auscultation bilaterally, no wheezes, rhonchi, or rales Cardiovascular:  regular rate rhythm, S1, S2, systolic murmur, rubs or gallops Abdomen: Soft, non-tender, non-distended, normoactive bowel sounds, no HSM Extremities: warm and well perfused, no clubbing, cyanosis, or edema Skin: No rash or lesions, previous shingles and superinfection have healed. Neuro: Non-focal Breast: Left mastectomy no nodularity, or local sign of recurrence.  Right breast is without masses ECOG PERFORMANCE STATUS: 1 - Symptomatic but completely ambulatory  LABORATORY DATA: Lab Results  Component Value Date   WBC 8.0 12/28/2012   HGB 11.5* 12/28/2012   HCT 35.6 12/28/2012   MCV 84.8 12/28/2012   PLT 295 12/28/2012      Chemistry      Component Value Date/Time   NA 138 12/07/2012 1333   NA 139 02/22/2012 1148   K 4.3 12/07/2012 1333   K 4.0 02/22/2012 1148   CL 103 12/07/2012 1333   CL 106 02/22/2012 1148   CO2 26 12/07/2012 1333   CO2 27 02/22/2012 1148   BUN 16.5 12/07/2012 1333   BUN 15 02/22/2012 1148   CREATININE 0.7 12/07/2012 1333   CREATININE 0.52 02/22/2012 1148      Component Value Date/Time   CALCIUM 9.6 12/07/2012 1333   CALCIUM 8.6 02/22/2012 1148   ALKPHOS 92 12/07/2012 1333   ALKPHOS 59 02/22/2012 1148   AST 17 12/07/2012 1333   AST 21 02/22/2012 1148   ALT 15 12/07/2012 1333   ALT 30 02/22/2012 1148   BILITOT 0.37 12/07/2012 1333   BILITOT 0.2* 02/22/2012 1148    ADDITIONAL INFORMATION: 2. PROGNOSTIC INDICATORS - ACIS Results IMMUNOHISTOCHEMICAL AND MORPHOMETRIC ANALYSIS BY THE AUTOMATED CELLULAR IMAGING SYSTEM (ACIS) THE SMALLER TUMOR SHOWS THE FOLLOWING BREAST PROGNOSTIC PROFILE: Estrogen Receptor (Negative, <1%): 100%,POSITIVE, STRONG STAINING INTENSITY Progesterone Receptor (Negative, <1%): 7%,POSITIVE, STRONG STAINING INTENSITY Proliferation Marker Ki67 by M IB-1 (Low<20%): 50% All controls stained appropriately Abigail Miyamoto MD Pathologist, Electronic Signature ( Signed 12/25/2011) 2. CHROMOGENIC IN-SITU HYBRIDIZATION Interpretation: 0.7 CM SMALLER TUMOR: HER2/NEU  BY CISH - SHOWS AMPLIFICATION BY CISH ANALYSIS. THE RATIO OF HER2: CEP 17 SIGNALS WAS 4.94. 1.7 CM LEFT SUPERIOR TUMOR: HER-2/NEU BY CISH - NO AMPLIFICATION OF HER-2 DETECTED. THE RATIO OF HER-2: CEP 17 SIGNALS WAS 1.14. Reference range: Ratio: HER2:CEP17 < 1.8 - gene amplification not observed Ratio: HER2:CEP 17 1.8-2.2 - equivocal result Ratio: HER2:CEP17 >  2.2 - gene amplification observed 1 of 4 FINAL for MISHA, [place] [phone]) ADDITIONAL INFORMATION:(continued) Comment: The cancer center was notified on 12-24-2011. Abigail Miyamoto MD Pathologist, Electronic Signature ( Signed 12/24/2011) FINAL DIAGNOSIS Diagnosis 1. Lymph node, sentinel, biopsy, Left - METASTATIC CARCINOMA IN 1 OF 1 LYMPH NODE WITH FOCAL CAPSULAR EXTENSION (1/1). 2. Breast, simple mastectomy, Left - INVASIVE DUCTAL CARCINOMA, TWO FOCI, WELL DIFFERENTIATED, SPANNING 1.7 AND 0.7 CM. - DUCTAL CARCINOMA IN SITU. - LYMPHOVASCULAR INVASION IS IDENTIFIED. - THE SURGICAL RESECTION MARGINS ARE NEGATIVE FOR CARCINOMA. - SEE ONCOLOGY TABLE BELOW. 3. Breast, biopsy, left, additional tissue - BENIGN SKIN AND SUBCUTANEOUS TISSUE. - THERE IS NO EVIDENCE OF MALIGNANCY. Microscopic Comment 2. BREAST, INVASIVE TUMOR, WITH LYMPH NODE SAMPLING Specimen, including laterality: Left breast. Procedure: Simple mastectomy Grade: Both are I Tubule formation: 1 and 1 Nuclear pleomorphism: 1 and 2 Mitotic:1 and 1 Tumor size (gross measurement): 1.7 and 0.7 cm Margins: Negative for carcinoma Invasive, distance to closest margin: 2.0 cm to the deep margin (gross measurement) In-situ, distance to closest margin: 2.0 cm from the deep margin (gross measurement). Lymphovascular invasion: Present. Ductal carcinoma in situ: Present. Grade: Intermediate grade. Extensive intraductal component: No. Lobular neoplasia: Not identified. Tumor focality: Two foci. Treatment effect: N/A Extent of tumor: Confined to breast  parenchyma Lymph nodes: # examined: 1 Lymph nodes with metastasis: 1 Isolated tumor cells (< 0.2 mm): 0 Micrometastasis: (> 0.2 mm and < 2.0 mm): 0 Macrometastasis: (> 2.0 mm): 1 Extracapsular extension: Present, focal. 2 of 4 FINAL for Kluth, [place] S [phone]) Microscopic Comment(continued) Breast prognostic profile: [phone] Estrogen receptor: Positive (100%, strong staining intensity). Progesterone receptor: Positive (100%, strong staining intensity. Her 2 neu: No amplification was detected. The ratio was 1.45. Her 2 neu by CISH will be repeated on both tumors and the results reported separately. Ki-67: 13% Non-neoplastic breast: Healing biopsy site. TNM: mpT1c, pN1a Comments: Grossly, there are two foci of grade I invasive ductal carcinoma present in the simple mastectomy. The larger nodule spans 1.7 cm and is histologically identical to the previous core biopsy, [phone] ("left superior"). The second nodule, 0.7 cm, is grade I invasive ductal carcinoma with extracellular mucin. A complete profile will be performed on this second, smaller nodule. (JBK:gt, 12/22/11) Pecola Leisure MD Pathologist, Electronic Signature   RADIOGRAPHIC STUDIES:  No results found.  ASSESSMENT: 79 year old female with:  #1 new diagnosis of stage II invasive ductal carcinoma of the left breast she is status post mastectomy with finding of 2 foci of invasive ductal carcinoma both are ER/PR positive but the smaller focus measuring 0.7 cm is HER-2/neu positive with a ratio over 4.  #2 patient was begun on adjuvant chemotherapy and Herceptin. With chemotherapy consisting of Taxol given weekly with weekly Herceptin. She received her first cycle on 02/08/2012- 10/13.  Patient subsequently could not tolerate the Taxol so therefore this was discontinued.  #3 she was then begun on Herceptin every 3 weeks beginning on 05/02/12.  #3 Anemia due to chemotherapy  #4 Osteopenia  PLAN: 1. Proceed  with herceptin and continue daily Femara. She had an appt with Dr. Gala Romney on 4/14 and he cleared her to continue with Herceptin.  We requested another appointment with him today.    2. Return in 3 weeks for her next dose of Herceptin.  3. For the osteopenia, she will take calcium, vitamin d.  Her last Vitamin D level was low, she was prescribed ergocalciferol weekly for four weeks, after this she will take  Vitamin D3 2000 units daily.  .  4. I have ordered another mammo and requested it be scheduled.  I encouraged the patient to keep her mammo appt. She has yet to go to her appointment.  5. I again recommended she see her PCP or ortho about her knee injury, particularly if it worsens.     She knows to call with any questions or concerns before her next appointment.    I spent 25 minutes counseling the patient face to face. The total time spent in the appointment was 30 minutes.  Cherie Ouch Lyn Hollingshead, NP Medical Oncology [facility] Phone: [phone]

## 2012-12-28 NOTE — Telephone Encounter (Signed)
, °

## 2013-01-16 ENCOUNTER — Ambulatory Visit (HOSPITAL_BASED_OUTPATIENT_CLINIC_OR_DEPARTMENT_OTHER)
Admission: RE | Admit: 2013-01-16 | Discharge: 2013-01-16 | Disposition: A | Discharge status: Home / Self Care | Payer: Medicare Other | Source: Ambulatory Visit | Attending: Internal Medicine | Admitting: Internal Medicine

## 2013-01-16 ENCOUNTER — Ambulatory Visit (HOSPITAL_COMMUNITY)
Admission: RE | Admit: 2013-01-16 | Discharge: 2013-01-16 | Disposition: A | Discharge status: Home / Self Care | Payer: Medicare Other | Source: Ambulatory Visit | Attending: Family Medicine | Admitting: Family Medicine

## 2013-01-16 ENCOUNTER — Encounter (HOSPITAL_COMMUNITY): Payer: Self-pay

## 2013-01-16 VITALS — BP 120/56 | HR 83 | Wt 159.0 lb

## 2013-01-16 DIAGNOSIS — J45909 Unspecified asthma, uncomplicated: Secondary | ICD-10-CM | POA: Insufficient documentation

## 2013-01-16 DIAGNOSIS — I350 Nonrheumatic aortic (valve) stenosis: Secondary | ICD-10-CM | POA: Diagnosis present

## 2013-01-16 DIAGNOSIS — E785 Hyperlipidemia, unspecified: Secondary | ICD-10-CM | POA: Insufficient documentation

## 2013-01-16 DIAGNOSIS — C50919 Malignant neoplasm of unspecified site of unspecified female breast: Secondary | ICD-10-CM | POA: Diagnosis present

## 2013-01-16 DIAGNOSIS — I10 Essential (primary) hypertension: Secondary | ICD-10-CM | POA: Insufficient documentation

## 2013-01-16 DIAGNOSIS — I517 Cardiomegaly: Secondary | ICD-10-CM

## 2013-01-16 DIAGNOSIS — I080 Rheumatic disorders of both mitral and aortic valves: Secondary | ICD-10-CM | POA: Insufficient documentation

## 2013-01-16 DIAGNOSIS — K219 Gastro-esophageal reflux disease without esophagitis: Secondary | ICD-10-CM | POA: Insufficient documentation

## 2013-01-16 DIAGNOSIS — C50412 Malignant neoplasm of upper-outer quadrant of left female breast: Secondary | ICD-10-CM

## 2013-01-16 DIAGNOSIS — R0989 Other specified symptoms and signs involving the circulatory and respiratory systems: Secondary | ICD-10-CM | POA: Insufficient documentation

## 2013-01-16 DIAGNOSIS — Z79899 Other long term (current) drug therapy: Secondary | ICD-10-CM | POA: Insufficient documentation

## 2013-01-16 DIAGNOSIS — I359 Nonrheumatic aortic valve disorder, unspecified: Secondary | ICD-10-CM

## 2013-01-16 DIAGNOSIS — R0609 Other forms of dyspnea: Secondary | ICD-10-CM | POA: Insufficient documentation

## 2013-01-16 NOTE — Assessment & Plan Note (Deleted)
BP more controlled on Spiro 12.5 mg daily, will continue.

## 2013-01-16 NOTE — Progress Notes (Signed)
Patient ID: Brandy [name], female   DOB: 06/30/1933, 79 y.o.   MRN: [id number] Referring Physician: Dr. Welton Flakes Primary Care: Dr. Sigmund Hazel Primary Cardiologist: None   HPI:  Brandy [name] is a 79 y.o. female with history of HTN, HL, GERD, and depression.  She states she had a stress test at one point in time which was normal.    Diagnosed with stage II invasive ductal carcinoma of the left breast and status post mastectomy with finding of 2 foci of invasive ductal carcinoma both are ER/PR positive but the smaller focus measuring 0.7 cm is HER-2/neu positive with a ratio over 4.  After discussion with Dr. Welton Flakes she started Taxol/herceptin on February 09, 2012 for a year. After completion of Herceptin she will receive 5 years of letrozole 2.5 mg daily.   ECHOs: 01/04/12: EF 60-65%, lateral s' 7.9 07/11/12 EF 60-65% Lateral 8.0 10/17/12 EF 60% Lateral S' 8.0 Mild AS 01/16/13 EF 60-65%, mild AS Lateral S' 11.8   Follow up: Last visit BP slightly elevated, started on 12.5 mg spironolactone with no complaints. Reports falling Friday and L hand numb. Denies any orthopnea, or CP. +exertional dyspnea with walking to the mailbox. Weight stable.   12/28/2012 K+ 3.9, Cr 0.8  Past Medical History  Diagnosis Date  . Asthma   . Hyperlipidemia     takes Simvastatin daily  . Hypertension     takes Amlodipine and Diovan daily  . Cancer     left breast  . Bronchitis     hx of;last time >73yr ago  . Arthritis     shoulders  . Dysphagia   . H/O hiatal hernia   . GERD (gastroesophageal reflux disease)     takes Prilosec prn  . Urinary urgency   . History of kidney stones     many yrs ago  . Depression     takes Celexa daily  . Insomnia     takes Ambien nightly  . Breast cancer 12/18/11    left breast masectomy=metastatic ca in (1/1) lymph node ,invasive ductal ca,2 foci,,dcis,lymph ovascular invasion identified,surgical resection margins neg for ca,additional tissue=benign skin and subcutaneous tissue  .  Breast CA 11/23/11    left breast 3 o'clock bx=high grade ductal ca in situ w/comedononecrosis and calcification,dcis,lymphovascular invasion present ,ER?PR+positive  . Full dentures   . Hx of radiation therapy 04/26/12 -06/10/12    left breast    Current Outpatient Prescriptions  Medication Sig Dispense Refill  . amLODipine (NORVASC) 10 MG tablet Take 10 mg by mouth daily.       . Calcium Carbonate-Vitamin D (CALCIUM + D PO) Take by mouth daily.      . citalopram (CELEXA) 20 MG tablet Take 20 mg by mouth daily.      Marland Kitchen DIOVAN HCT 160-12.5 MG per tablet Take 2 tablets by mouth every morning.       . ergocalciferol (VITAMIN D2) 50000 UNITS capsule Take 1 capsule (50,000 Units total) by mouth once a week.  4 capsule  0  . gabapentin (NEURONTIN) 100 MG capsule TAKE 3 CAPSULES AT BEDTIME  90 capsule  0  . letrozole (FEMARA) 2.5 MG tablet Take 1 tablet (2.5 mg total) by mouth daily.  90 tablet  12  . lidocaine-prilocaine (EMLA) cream Apply topically as needed.  30 g  4  . omeprazole (PRILOSEC) 20 MG capsule Take 20 mg by mouth daily.      Marland Kitchen oxyCODONE (OXY IR/ROXICODONE) 5 MG immediate release tablet Take  1 tablet (5 mg total) by mouth every 4 (four) hours as needed for pain.  90 tablet  0  . potassium chloride SA (K-DUR,KLOR-CON) 20 MEQ tablet Take 1 tablet (20 mEq total) by mouth 2 (two) times daily.  20 tablet  0  . simvastatin (ZOCOR) 20 MG tablet Take 20 mg by mouth daily.       Marland Kitchen spironolactone (ALDACTONE) 25 MG tablet Take 0.5 tablets (12.5 mg total) by mouth daily.  30 tablet  3  . sulfamethoxazole-trimethoprim (BACTRIM DS) 800-160 MG per tablet       . zolpidem (AMBIEN) 10 MG tablet Take 10 mg by mouth at bedtime.       No current facility-administered medications for this encounter.   Facility-Administered Medications Ordered in Other Encounters  Medication Dose Route Frequency Provider Last Rate Last Dose  . sodium chloride 0.9 % injection 10 mL  10 mL Intracatheter PRN Victorino December,  MD   10 mL at 03/14/12 1628    No Known Allergies  History   Social History  . Marital Status: Married    Spouse Name: N/A    Number of Children: N/A  . Years of Education: N/A   Occupational History  . Not on file.   Social History Main Topics  . Smoking status: Never Smoker   . Smokeless tobacco: Never Used  . Alcohol Use: No  . Drug Use: No  . Sexually Active: Yes    Birth Control/ Protection: Surgical     Comment: mensus age 67, 1st pregnancy 18, no hrt gg4,p3, 1 babay lived a few hours complications   Other Topics Concern  . Not on file   Social History Narrative  . No narrative on file    Family History  Problem Relation Age of Onset  . Cancer Father     prostate  . Cancer Other     breast ca    PHYSICAL EXAM: Filed Vitals:   01/16/13 1356  BP: 120/56  Pulse: 83  Weight: 159 lb (72.122 kg)  SpO2: 98%    General:  Eldery appearing. No respiratory difficulty HEENT: normal Neck: supple. JVP 6. Carotids 2+ bilat; no bruits. No lymphadenopathy or thryomegaly appreciated. Cor: PMI nondisplaced. Regular rate & rhythm. No rubs, gallops. 2/6 AS murmur. Crisp S2 Lungs: clear Abdomen: soft, nontender, nondistended. No hepatosplenomegaly. No bruits or masses. Good bowel sounds. Extremities: no cyanosis, clubbing, rash, edema Neuro: alert & oriented x 3, cranial nerves grossly intact. moves all 4 extremities w/o difficulty. Affect pleasant.    ASSESSMENT & PLAN: 1. Breast Cancer: Reviewed ECHO today and EF 60-65%, mild AS and lateral S' 11.8 cm/sec. Will continue herceptin tx and follow up in 3 months with ECHO. 2. HTN: Better controlled with initiation of Spironolactone 12.5 mg last visit; BMET was stable 6/25 K+ 3.9 and Cr 0.8 will continue to monitor 3. AS: Mild, will continue to follow.   Brandy [name] 01/16/2013

## 2013-01-16 NOTE — Patient Instructions (Addendum)
Follow up 3 months with ECHO   

## 2013-01-16 NOTE — Assessment & Plan Note (Deleted)
Reviewed ECHO results in clinic. S" prime up. Will continue herceptin tx and follow up in 3 months with ECHO.

## 2013-01-16 NOTE — Progress Notes (Signed)
*  PRELIMINARY RESULTS* Echocardiogram 2D Echocardiogram has been performed.  Jeryl Columbia 01/16/2013, 2:06 PM

## 2013-01-16 NOTE — Assessment & Plan Note (Deleted)
Mild will continue to follow.

## 2013-01-17 ENCOUNTER — Other Ambulatory Visit: Payer: Self-pay | Admitting: Emergency Medicine

## 2013-01-17 DIAGNOSIS — C50419 Malignant neoplasm of upper-outer quadrant of unspecified female breast: Secondary | ICD-10-CM

## 2013-01-18 ENCOUNTER — Encounter: Payer: Self-pay | Admitting: Adult Health

## 2013-01-18 ENCOUNTER — Other Ambulatory Visit (HOSPITAL_BASED_OUTPATIENT_CLINIC_OR_DEPARTMENT_OTHER): Payer: Medicare Other | Admitting: Lab

## 2013-01-18 ENCOUNTER — Ambulatory Visit (HOSPITAL_BASED_OUTPATIENT_CLINIC_OR_DEPARTMENT_OTHER): Payer: Medicare Other

## 2013-01-18 ENCOUNTER — Ambulatory Visit (HOSPITAL_BASED_OUTPATIENT_CLINIC_OR_DEPARTMENT_OTHER): Payer: Medicare Other | Admitting: Adult Health

## 2013-01-18 VITALS — BP 104/63 | HR 86 | Temp 98.2°F | Resp 20 | Ht 59.0 in | Wt 157.4 lb

## 2013-01-18 DIAGNOSIS — Z9010 Acquired absence of unspecified breast and nipple: Secondary | ICD-10-CM

## 2013-01-18 DIAGNOSIS — Z170 Estrogen receptor positive status [ER+]: Secondary | ICD-10-CM

## 2013-01-18 DIAGNOSIS — D6481 Anemia due to antineoplastic chemotherapy: Secondary | ICD-10-CM

## 2013-01-18 DIAGNOSIS — C50919 Malignant neoplasm of unspecified site of unspecified female breast: Secondary | ICD-10-CM

## 2013-01-18 DIAGNOSIS — T451X5A Adverse effect of antineoplastic and immunosuppressive drugs, initial encounter: Secondary | ICD-10-CM

## 2013-01-18 DIAGNOSIS — Z5112 Encounter for antineoplastic immunotherapy: Secondary | ICD-10-CM

## 2013-01-18 DIAGNOSIS — M949 Disorder of cartilage, unspecified: Secondary | ICD-10-CM

## 2013-01-18 DIAGNOSIS — C50419 Malignant neoplasm of upper-outer quadrant of unspecified female breast: Secondary | ICD-10-CM

## 2013-01-18 DIAGNOSIS — M899 Disorder of bone, unspecified: Secondary | ICD-10-CM

## 2013-01-18 LAB — COMPREHENSIVE METABOLIC PANEL (CC13)
ALT: 20 U/L (ref 0–55)
Alkaline Phosphatase: 81 U/L (ref 40–150)
CO2: 22 mEq/L (ref 22–29)
Creatinine: 0.8 mg/dL (ref 0.6–1.1)
Sodium: 136 mEq/L (ref 136–145)
Total Bilirubin: 0.34 mg/dL (ref 0.20–1.20)
Total Protein: 7.4 g/dL (ref 6.4–8.3)

## 2013-01-18 LAB — CBC WITH DIFFERENTIAL/PLATELET
Basophils Absolute: 0 10*3/uL (ref 0.0–0.1)
EOS%: 2.1 % (ref 0.0–7.0)
Eosinophils Absolute: 0.2 10*3/uL (ref 0.0–0.5)
HCT: 35.1 % (ref 34.8–46.6)
HGB: 11.6 g/dL (ref 11.6–15.9)
MCH: 28.2 pg (ref 25.1–34.0)
MONO#: 0.7 10*3/uL (ref 0.1–0.9)
NEUT#: 6.2 10*3/uL (ref 1.5–6.5)
NEUT%: 63.1 % (ref 38.4–76.8)
lymph#: 2.7 10*3/uL (ref 0.9–3.3)

## 2013-01-18 MED ORDER — TRASTUZUMAB CHEMO INJECTION 440 MG
6.0000 mg/kg | Freq: Once | INTRAVENOUS | Status: AC
  Administered 2013-01-18: 462 mg via INTRAVENOUS
  Filled 2013-01-18: qty 22

## 2013-01-18 MED ORDER — DIPHENHYDRAMINE HCL 25 MG PO CAPS
50.0000 mg | ORAL_CAPSULE | Freq: Once | ORAL | Status: AC
  Administered 2013-01-18: 50 mg via ORAL

## 2013-01-18 MED ORDER — SODIUM CHLORIDE 0.9 % IV SOLN
Freq: Once | INTRAVENOUS | Status: AC
  Administered 2013-01-18: 16:00:00 via INTRAVENOUS

## 2013-01-18 MED ORDER — HEPARIN SOD (PORK) LOCK FLUSH 100 UNIT/ML IV SOLN
500.0000 [IU] | Freq: Once | INTRAVENOUS | Status: AC | PRN
  Administered 2013-01-18: 500 [IU]
  Filled 2013-01-18: qty 5

## 2013-01-18 MED ORDER — ACETAMINOPHEN 325 MG PO TABS
650.0000 mg | ORAL_TABLET | Freq: Once | ORAL | Status: AC
  Administered 2013-01-18: 650 mg via ORAL

## 2013-01-18 MED ORDER — SODIUM CHLORIDE 0.9 % IJ SOLN
10.0000 mL | INTRAMUSCULAR | Status: DC | PRN
  Administered 2013-01-18: 10 mL
  Filled 2013-01-18: qty 10

## 2013-01-18 NOTE — Patient Instructions (Addendum)
Doing well.  Proceed with Herceptin.  Please call us if you have any questions or concerns.    

## 2013-01-18 NOTE — Progress Notes (Signed)
OFFICE PROGRESS NOTE  CC  Brandy Labella, MD [address] [place] [address] Dr. Emelia Loron Dr. Antony Blackbird  DIAGNOSIS:  79 year old female with new diagnosis of multifocal breast cancer of the left breast. Patient is status post mastectomy with sentinel lymph node biopsy performed on 12/18/2011.   PRIOR THERAPY: 1.Patient has had multiple biopsies performed including endoscopic ultrasound guided biopsies as well as an open biopsy without a definitive diagnosis  #2 patient now with new diagnosis of multifocal breast cancer of the left breast. She underwent a mammogram after she felt a mass in the left breast. The mammogram showed an abnormality. As a biopsy was performed and she was found to have an ace invasive ductal carcinoma. She has now had a mastectomy on 12/18/2011. The final pathology revealed 2 foci of well-differentiated invasive ductal carcinoma one measuring 1.7 cm and the second measuring 0.7 cm there was ductal carcinoma in situ lymphovascular invasion was also identified all surgical margins were negative for carcinoma. Patient went on to also have a left sentinel node biopsy one sentinel node was positive for metastatic disease with focal capsular extension. The tumor was estrogen receptor positive progesterone receptor positive. The largest tumor measuring 1.7 cm was HER-2/neu negative with a Ki-67 of 13%. The smaller tumor showed ER +100% PR receptor 7% proliferation marker 50% and it was HER-2/neu positive with a ratio 4.94.  #3 patient has been seen by radiation oncology and post mastectomy radiation therapy is recommended. At this time she will not get a left axillary lymph node dissection.  #4 patient's tumor is HER-2/neu positive and does she will receive adjuvant chemotherapy and Herceptin based therapy.  #5 patient will proceed with combination chemotherapy consisting of Taxol and Herceptin starting on 02/09/2012. A total of 12 weeks of combination  treatment is planned.  This was discontinued early due to inability to tolerate and she was started on Herceptin only 05/02/13.   #6 Radiation therapy from 04/26/12 through 06/10/12.    #7 She was started on adjuvant Letrozole in 06/2012. A total of 5 years of therapy is planned.    CURRENT THERAPY: Q 3 week herceptin and Letrozole daily.    INTERVAL HISTORY: Brandy [name] 79 y.o. female returns for followup visit today for evaluation prior to Herceptin therapy.  She continues to take Letrozole and does have some joint aches, but is otherwise continuing to tolerate it well. Her knee has improved since her last appointment.  She saw Herceptin therapy.  She denies fevers, chills, DOE, PND, orthopnea, swelling, dryness, or any other concerns.  Otherwise a 10 point ROS is neg.   MEDICAL HISTORY: Past Medical History  Diagnosis Date  . Asthma   . Hyperlipidemia     takes Simvastatin daily  . Hypertension     takes Amlodipine and Diovan daily  . Cancer     left breast  . Bronchitis     hx of;last time >40yr ago  . Arthritis     shoulders  . Dysphagia   . H/O hiatal hernia   . GERD (gastroesophageal reflux disease)     takes Prilosec prn  . Urinary urgency   . History of kidney stones     many yrs ago  . Depression     takes Celexa daily  . Insomnia     takes Ambien nightly  . Breast cancer 12/18/11    left breast masectomy=metastatic ca in (1/1) lymph node ,invasive ductal ca,2 foci,,dcis,lymph ovascular invasion identified,surgical resection  margins neg for ca,additional tissue=benign skin and subcutaneous tissue  . Breast CA 11/23/11    left breast 3 o'clock bx=high grade ductal ca in situ w/comedononecrosis and calcification,dcis,lymphovascular invasion present ,ER?PR+positive  . Full dentures   . Hx of radiation therapy 04/26/12 -06/10/12    left breast    ALLERGIES:  has No Known Allergies.  MEDICATIONS:  Current Outpatient Prescriptions  Medication Sig Dispense Refill   . amLODipine (NORVASC) 10 MG tablet Take 10 mg by mouth daily.       . Calcium Carbonate-Vitamin D (CALCIUM + D PO) Take by mouth daily.      . citalopram (CELEXA) 20 MG tablet Take 20 mg by mouth daily.      Marland Kitchen DIOVAN HCT 160-12.5 MG per tablet Take 2 tablets by mouth every morning.       . ergocalciferol (VITAMIN D2) 50000 UNITS capsule Take 1 capsule (50,000 Units total) by mouth once a week.  4 capsule  0  . gabapentin (NEURONTIN) 100 MG capsule TAKE 3 CAPSULES AT BEDTIME  90 capsule  0  . letrozole (FEMARA) 2.5 MG tablet Take 1 tablet (2.5 mg total) by mouth daily.  90 tablet  12  . lidocaine-prilocaine (EMLA) cream Apply topically as needed.  30 g  4  . omeprazole (PRILOSEC) 20 MG capsule Take 20 mg by mouth daily.      Marland Kitchen oxyCODONE (OXY IR/ROXICODONE) 5 MG immediate release tablet Take 1 tablet (5 mg total) by mouth every 4 (four) hours as needed for pain.  90 tablet  0  . potassium chloride SA (K-DUR,KLOR-CON) 20 MEQ tablet Take 1 tablet (20 mEq total) by mouth 2 (two) times daily.  20 tablet  0  . simvastatin (ZOCOR) 20 MG tablet Take 20 mg by mouth daily.       Marland Kitchen spironolactone (ALDACTONE) 25 MG tablet Take 0.5 tablets (12.5 mg total) by mouth daily.  30 tablet  3  . sulfamethoxazole-trimethoprim (BACTRIM DS) 800-160 MG per tablet       . zolpidem (AMBIEN) 10 MG tablet Take 10 mg by mouth at bedtime.       No current facility-administered medications for this visit.   Facility-Administered Medications Ordered in Other Visits  Medication Dose Route Frequency Provider Last Rate Last Dose  . sodium chloride 0.9 % injection 10 mL  10 mL Intracatheter PRN Victorino December, MD   10 mL at 03/14/12 1628    SURGICAL HISTORY:  Past Surgical History  Procedure Laterality Date  . Breast biopsy  1998    left  . Total shoulder replacement  2011    left  . Eus  06/04/2011    Procedure: UPPER ENDOSCOPIC ULTRASOUND (EUS) LINEAR;  Surgeon: Rob Bunting, MD;  Location: [facility];  Service:  Endoscopy;  Laterality: N/A;  . Exploratory laparotomy       biopsy of intra-abdominal mass  . Bladder tack    . Tubal ligation    . Appendectomy    . Dilation and curettage of uterus    . Esophagogastroduodenoscopy    . Mastectomy w/ sentinel node biopsy  12/18/2011    Procedure: MASTECTOMY WITH SENTINEL LYMPH NODE BIOPSY;  Surgeon: Emelia Loron, MD;  Location: [facility] OR;  Service: General;  Laterality: Left;  . Portacath placement  01/27/2012    Procedure: INSERTION PORT-A-CATH;  Surgeon: Emelia Loron, MD;  Location: [facility];  Service: General;  Laterality: Right;  PORT PLACEMENT    REVIEW OF SYSTEMS:  General: fatigue (+), night sweats (-), fever (-), pain (-) Lymph: palpable nodes (-) HEENT: vision changes (-), mucositis (-), gum bleeding (-), epistaxis (-) Cardiovascular: chest pain (-), palpitations (-) Pulmonary: shortness of breath (-), dyspnea on exertion (-), cough (-), hemoptysis (-) GI:  Early satiety (-), melena (-), dysphagia (-), nausea/vomiting (-), diarrhea (-) GU: dysuria (-), hematuria (-), incontinence (-) Musculoskeletal: joint swelling (-), joint pain (+), back pain (-) Neuro: weakness (-), numbness (-), headache (-), confusion (-) Skin: Rash (-), lesions (-), dryness (-) Psych: depression (-), suicidal/homicidal ideation (-), feeling of hopelessness (-)  Health Maintenance  Mammogram: 12/2012 Colonoscopy: Never Bone Density Scan: 10/28/12 Pap Smear: Years Eye Exam: 2 years ago Vitamin D Level: 1/14 Lipid Panel: 2011   PHYSICAL EXAMINATION:  BP 104/63  Pulse 86  Temp(Src) 98.2 F (36.8 C) (Oral)  Resp 20  Ht 4\' 11"  (1.499 m)  Wt 157 lb 6.4 oz (71.396 kg)  BMI 31.77 kg/m2 General: Patient is an elderly appearing female in no acute distress HEENT: PERRLA, sclerae anicteric no conjunctival pallor, MMM Neck: supple, no palpable adenopathy Lungs: clear to auscultation bilaterally, no wheezes, rhonchi, or rales Cardiovascular:  regular rate rhythm, S1, S2, systolic murmur, rubs or gallops Abdomen: Soft, non-tender, non-distended, normoactive bowel sounds, no HSM Extremities: warm and well perfused, no clubbing, cyanosis, or edema Skin: No rash or lesions, previous shingles and superinfection have healed. Neuro: Non-focal Breast: Left mastectomy no nodularity, or local sign of recurrence.  Right breast is without masses ECOG PERFORMANCE STATUS: 1 - Symptomatic but completely ambulatory  LABORATORY DATA: Lab Results  Component Value Date   WBC 9.9 01/18/2013   HGB 11.6 01/18/2013   HCT 35.1 01/18/2013   MCV 85.4 01/18/2013   PLT 270 01/18/2013      Chemistry      Component Value Date/Time   NA 143 12/28/2012 1343   NA 139 02/22/2012 1148   K 3.9 12/28/2012 1343   K 4.0 02/22/2012 1148   CL 108* 12/28/2012 1343   CL 106 02/22/2012 1148   CO2 26 12/28/2012 1343   CO2 27 02/22/2012 1148   BUN 16.0 12/28/2012 1343   BUN 15 02/22/2012 1148   CREATININE 0.8 12/28/2012 1343   CREATININE 0.52 02/22/2012 1148      Component Value Date/Time   CALCIUM 9.6 12/28/2012 1343   CALCIUM 8.6 02/22/2012 1148   ALKPHOS 91 12/28/2012 1343   ALKPHOS 59 02/22/2012 1148   AST 16 12/28/2012 1343   AST 21 02/22/2012 1148   ALT 13 12/28/2012 1343   ALT 30 02/22/2012 1148   BILITOT 0.27 12/28/2012 1343   BILITOT 0.2* 02/22/2012 1148    ADDITIONAL INFORMATION: 2. PROGNOSTIC INDICATORS - ACIS Results IMMUNOHISTOCHEMICAL AND MORPHOMETRIC ANALYSIS BY THE AUTOMATED CELLULAR IMAGING SYSTEM (ACIS) THE SMALLER TUMOR SHOWS THE FOLLOWING BREAST PROGNOSTIC PROFILE: Estrogen Receptor (Negative, <1%): 100%,POSITIVE, STRONG STAINING INTENSITY Progesterone Receptor (Negative, <1%): 7%,POSITIVE, STRONG STAINING INTENSITY Proliferation Marker Ki67 by M IB-1 (Low<20%): 50% All controls stained appropriately Abigail Miyamoto MD Pathologist, Electronic Signature ( Signed 12/25/2011) 2. CHROMOGENIC IN-SITU HYBRIDIZATION Interpretation: 0.7 CM SMALLER  TUMOR: HER2/NEU BY CISH - SHOWS AMPLIFICATION BY CISH ANALYSIS. THE RATIO OF HER2: CEP 17 SIGNALS WAS 4.94. 1.7 CM LEFT SUPERIOR TUMOR: HER-2/NEU BY CISH - NO AMPLIFICATION OF HER-2 DETECTED. THE RATIO OF HER-2: CEP 17 SIGNALS WAS 1.14. Reference range: Ratio: HER2:CEP17 < 1.8 - gene amplification not observed Ratio: HER2:CEP 17 1.8-2.2 - equivocal result Ratio: HER2:CEP17 > 2.2 - gene  amplification observed 1 of 4 FINAL for Brandy [name], Brandy [name] [phone]) ADDITIONAL INFORMATION:(continued) Comment: The cancer center was notified on 12-24-2011. Abigail Miyamoto MD Pathologist, Electronic Signature ( Signed 12/24/2011) FINAL DIAGNOSIS Diagnosis 1. Lymph node, sentinel, biopsy, Left - METASTATIC CARCINOMA IN 1 OF 1 LYMPH NODE WITH FOCAL CAPSULAR EXTENSION (1/1). 2. Breast, simple mastectomy, Left - INVASIVE DUCTAL CARCINOMA, TWO FOCI, WELL DIFFERENTIATED, SPANNING 1.7 AND 0.7 CM. - DUCTAL CARCINOMA IN SITU. - LYMPHOVASCULAR INVASION IS IDENTIFIED. - THE SURGICAL RESECTION MARGINS ARE NEGATIVE FOR CARCINOMA. - SEE ONCOLOGY TABLE BELOW. 3. Breast, biopsy, left, additional tissue - BENIGN SKIN AND SUBCUTANEOUS TISSUE. - THERE IS NO EVIDENCE OF MALIGNANCY. Microscopic Comment 2. BREAST, INVASIVE TUMOR, WITH LYMPH NODE SAMPLING Specimen, including laterality: Left breast. Procedure: Simple mastectomy Grade: Both are I Tubule formation: 1 and 1 Nuclear pleomorphism: 1 and 2 Mitotic:1 and 1 Tumor size (gross measurement): 1.7 and 0.7 cm Margins: Negative for carcinoma Invasive, distance to closest margin: 2.0 cm to the deep margin (gross measurement) In-situ, distance to closest margin: 2.0 cm from the deep margin (gross measurement). Lymphovascular invasion: Present. Ductal carcinoma in situ: Present. Grade: Intermediate grade. Extensive intraductal component: No. Lobular neoplasia: Not identified. Tumor focality: Two foci. Treatment effect: N/A Extent of tumor: Confined to  breast parenchyma Lymph nodes: # examined: 1 Lymph nodes with metastasis: 1 Isolated tumor cells (< 0.2 mm): 0 Micrometastasis: (> 0.2 mm and < 2.0 mm): 0 Macrometastasis: (> 2.0 mm): 1 Extracapsular extension: Present, focal. 2 of 4 FINAL for Peedin, Pablo S [phone]) Microscopic Comment(continued) Breast prognostic profile: [phone] Estrogen receptor: Positive (100%, strong staining intensity). Progesterone receptor: Positive (100%, strong staining intensity. Her 2 neu: No amplification was detected. The ratio was 1.45. Her 2 neu by CISH will be repeated on both tumors and the results reported separately. Ki-67: 13% Non-neoplastic breast: Healing biopsy site. TNM: mpT1c, pN1a Comments: Grossly, there are two foci of grade I invasive ductal carcinoma present in the simple mastectomy. The larger nodule spans 1.7 cm and is histologically identical to the previous core biopsy, [phone] ("left superior"). The second nodule, 0.7 cm, is grade I invasive ductal carcinoma with extracellular mucin. A complete profile will be performed on this second, smaller nodule. (JBK:gt, 12/22/11) Pecola Leisure MD Pathologist, Electronic Signature   RADIOGRAPHIC STUDIES:  No results found.  ASSESSMENT: 79 year old female with:  #1 new diagnosis of stage II invasive ductal carcinoma of the left breast she is status post mastectomy with finding of 2 foci of invasive ductal carcinoma both are ER/PR positive but the smaller focus measuring 0.7 cm is HER-2/neu positive with a ratio over 4.  #2 patient was begun on adjuvant chemotherapy and Herceptin. With chemotherapy consisting of Taxol given weekly with weekly Herceptin. She received her first cycle on 02/08/2012- 10/13.  Patient subsequently could not tolerate the Taxol so therefore this was discontinued.  #3 she was then begun on Herceptin every 3 weeks beginning on 05/02/12.  #3 Anemia due to chemotherapy  #4  Osteopenia--Prolia  PLAN: 1. Proceed with herceptin and continue daily Femara. She had an appt with Dr. Gala Romney on 7/14 and he cleared her to continue with Herceptin.    2. Return in 3 weeks for her final dose of Herceptin.  3. For the osteopenia, she will take calcium, vitamin d and receives Prolia every 6 months.  Her last Vitamin D level was low, she was prescribed ergocalciferol weekly for four weeks, after this she will take Vitamin D3 2000 units daily.  Marland Kitchen  4. Her last mammo was in June and was normal.    5. I again recommended she see her PCP or ortho about her knee injury, particularly if it worsens.     She knows to call with any questions or concerns before her next appointment.    I spent 25 minutes counseling the patient face to face. The total time spent in the appointment was 30 minutes.  Cherie Ouch Lyn Hollingshead, NP Medical Oncology [facility], [place] Phone: [phone]

## 2013-02-07 ENCOUNTER — Other Ambulatory Visit: Payer: Self-pay | Admitting: Medical Oncology

## 2013-02-07 DIAGNOSIS — C50919 Malignant neoplasm of unspecified site of unspecified female breast: Secondary | ICD-10-CM

## 2013-02-08 ENCOUNTER — Ambulatory Visit (HOSPITAL_BASED_OUTPATIENT_CLINIC_OR_DEPARTMENT_OTHER): Payer: Medicare Other | Admitting: Adult Health

## 2013-02-08 ENCOUNTER — Ambulatory Visit (HOSPITAL_BASED_OUTPATIENT_CLINIC_OR_DEPARTMENT_OTHER): Payer: Medicare Other

## 2013-02-08 ENCOUNTER — Other Ambulatory Visit (HOSPITAL_BASED_OUTPATIENT_CLINIC_OR_DEPARTMENT_OTHER): Payer: Medicare Other | Admitting: Lab

## 2013-02-08 ENCOUNTER — Telehealth: Payer: Self-pay | Admitting: Adult Health

## 2013-02-08 ENCOUNTER — Encounter: Payer: Self-pay | Admitting: Adult Health

## 2013-02-08 VITALS — BP 103/70 | HR 102 | Temp 99.2°F | Resp 20 | Ht 59.0 in | Wt 161.6 lb

## 2013-02-08 DIAGNOSIS — Z5112 Encounter for antineoplastic immunotherapy: Secondary | ICD-10-CM

## 2013-02-08 DIAGNOSIS — C773 Secondary and unspecified malignant neoplasm of axilla and upper limb lymph nodes: Secondary | ICD-10-CM

## 2013-02-08 DIAGNOSIS — E559 Vitamin D deficiency, unspecified: Secondary | ICD-10-CM

## 2013-02-08 DIAGNOSIS — C50419 Malignant neoplasm of upper-outer quadrant of unspecified female breast: Secondary | ICD-10-CM

## 2013-02-08 DIAGNOSIS — C50919 Malignant neoplasm of unspecified site of unspecified female breast: Secondary | ICD-10-CM

## 2013-02-08 DIAGNOSIS — Z170 Estrogen receptor positive status [ER+]: Secondary | ICD-10-CM

## 2013-02-08 DIAGNOSIS — M899 Disorder of bone, unspecified: Secondary | ICD-10-CM

## 2013-02-08 DIAGNOSIS — M949 Disorder of cartilage, unspecified: Secondary | ICD-10-CM

## 2013-02-08 DIAGNOSIS — D6481 Anemia due to antineoplastic chemotherapy: Secondary | ICD-10-CM

## 2013-02-08 LAB — CBC WITH DIFFERENTIAL/PLATELET
BASO%: 0.3 % (ref 0.0–2.0)
EOS%: 3.5 % (ref 0.0–7.0)
Eosinophils Absolute: 0.2 10*3/uL (ref 0.0–0.5)
LYMPH%: 27.9 % (ref 14.0–49.7)
MCH: 27.9 pg (ref 25.1–34.0)
MCHC: 32.4 g/dL (ref 31.5–36.0)
MCV: 86.1 fL (ref 79.5–101.0)
MONO%: 6.3 % (ref 0.0–14.0)
Platelets: 250 10*3/uL (ref 145–400)
RBC: 3.73 10*6/uL (ref 3.70–5.45)

## 2013-02-08 LAB — COMPREHENSIVE METABOLIC PANEL (CC13)
AST: 17 U/L (ref 5–34)
Alkaline Phosphatase: 73 U/L (ref 40–150)
Glucose: 86 mg/dl (ref 70–140)
Sodium: 140 mEq/L (ref 136–145)
Total Bilirubin: 0.3 mg/dL (ref 0.20–1.20)
Total Protein: 7.2 g/dL (ref 6.4–8.3)

## 2013-02-08 MED ORDER — SODIUM CHLORIDE 0.9 % IJ SOLN
10.0000 mL | INTRAMUSCULAR | Status: DC | PRN
  Administered 2013-02-08: 10 mL
  Filled 2013-02-08: qty 10

## 2013-02-08 MED ORDER — ACETAMINOPHEN 325 MG PO TABS
650.0000 mg | ORAL_TABLET | Freq: Once | ORAL | Status: AC
  Administered 2013-02-08: 650 mg via ORAL

## 2013-02-08 MED ORDER — DIPHENHYDRAMINE HCL 25 MG PO CAPS
50.0000 mg | ORAL_CAPSULE | Freq: Once | ORAL | Status: AC
  Administered 2013-02-08: 50 mg via ORAL

## 2013-02-08 MED ORDER — TRASTUZUMAB CHEMO INJECTION 440 MG
6.0000 mg/kg | Freq: Once | INTRAVENOUS | Status: AC
  Administered 2013-02-08: 462 mg via INTRAVENOUS
  Filled 2013-02-08: qty 22

## 2013-02-08 MED ORDER — HEPARIN SOD (PORK) LOCK FLUSH 100 UNIT/ML IV SOLN
500.0000 [IU] | Freq: Once | INTRAVENOUS | Status: AC | PRN
  Administered 2013-02-08: 500 [IU]
  Filled 2013-02-08: qty 5

## 2013-02-08 MED ORDER — SODIUM CHLORIDE 0.9 % IV SOLN
Freq: Once | INTRAVENOUS | Status: AC
  Administered 2013-02-08: 16:00:00 via INTRAVENOUS

## 2013-02-08 NOTE — Progress Notes (Signed)
OFFICE PROGRESS NOTE  CC  Brandy Labella, MD [address] [place] [address] Dr. Emelia Loron Dr. Antony Blackbird  DIAGNOSIS:  79 year old female with new diagnosis of multifocal breast cancer of the left breast. Patient is status post mastectomy with sentinel lymph node biopsy performed on 12/18/2011.   PRIOR THERAPY: 1.Patient has had multiple biopsies performed including endoscopic ultrasound guided biopsies as well as an open biopsy without a definitive diagnosis  #2 patient now with new diagnosis of multifocal breast cancer of the left breast. She underwent a mammogram after she felt a mass in the left breast. The mammogram showed an abnormality. As a biopsy was performed and she was found to have an ace invasive ductal carcinoma. She has now had a mastectomy on 12/18/2011. The final pathology revealed 2 foci of well-differentiated invasive ductal carcinoma one measuring 1.7 cm and the second measuring 0.7 cm there was ductal carcinoma in situ lymphovascular invasion was also identified all surgical margins were negative for carcinoma. Patient went on to also have a left sentinel node biopsy one sentinel node was positive for metastatic disease with focal capsular extension. The tumor was estrogen receptor positive progesterone receptor positive. The largest tumor measuring 1.7 cm was HER-2/neu negative with a Ki-67 of 13%. The smaller tumor showed ER +100% PR receptor 7% proliferation marker 50% and it was HER-2/neu positive with a ratio 4.94.  #3 patient has been seen by radiation oncology and post mastectomy radiation therapy is recommended. At this time she will not get a left axillary lymph node dissection.  #4 patient's tumor is HER-2/neu positive and does she will receive adjuvant chemotherapy and Herceptin based therapy.  #5 patient will proceed with combination chemotherapy consisting of Taxol and Herceptin starting on 02/09/2012. A total of 12 weeks of combination  treatment is planned.  This was discontinued early due to inability to tolerate and she was started on Herceptin only 05/02/13.   #6 Radiation therapy from 04/26/12 through 06/10/12.    #7 She was started on adjuvant Letrozole in 06/2012. A total of 5 years of therapy is planned.    CURRENT THERAPY: Q 3 week herceptin and Letrozole daily.    INTERVAL HISTORY: Brandy [name] 79 y.o. female returns for followup visit today for evaluation prior to Herceptin therapy.  She is doing well today and here for her last herceptin treatment.  She has occasional joint aches.  She denies fevers, chills, nausea, vomiting, constipation, diarrhea, orthopnea, PND, or any further concerns.  A 10 point ROS is negative.   MEDICAL HISTORY: Past Medical History  Diagnosis Date  . Asthma   . Hyperlipidemia     takes Simvastatin daily  . Hypertension     takes Amlodipine and Diovan daily  . Cancer     left breast  . Bronchitis     hx of;last time >3yr ago  . Arthritis     shoulders  . Dysphagia   . H/O hiatal hernia   . GERD (gastroesophageal reflux disease)     takes Prilosec prn  . Urinary urgency   . History of kidney stones     many yrs ago  . Depression     takes Celexa daily  . Insomnia     takes Ambien nightly  . Breast cancer 12/18/11    left breast masectomy=metastatic ca in (1/1) lymph node ,invasive ductal ca,2 foci,,dcis,lymph ovascular invasion identified,surgical resection margins neg for ca,additional tissue=benign skin and subcutaneous tissue  . Breast CA 11/23/11  left breast 3 o'clock bx=high grade ductal ca in situ w/comedononecrosis and calcification,dcis,lymphovascular invasion present ,ER?PR+positive  . Full dentures   . Hx of radiation therapy 04/26/12 -06/10/12    left breast    ALLERGIES:  has No Known Allergies.  MEDICATIONS:  Current Outpatient Prescriptions  Medication Sig Dispense Refill  . amLODipine (NORVASC) 10 MG tablet Take 10 mg by mouth daily.       .  Calcium Carbonate-Vitamin D (CALCIUM + D PO) Take by mouth daily.      . citalopram (CELEXA) 20 MG tablet Take 20 mg by mouth daily.      Marland Kitchen DIOVAN HCT 160-12.5 MG per tablet Take 2 tablets by mouth every morning.       . ergocalciferol (VITAMIN D2) 50000 UNITS capsule Take 1 capsule (50,000 Units total) by mouth once a week.  4 capsule  0  . gabapentin (NEURONTIN) 100 MG capsule TAKE 3 CAPSULES AT BEDTIME  90 capsule  0  . letrozole (FEMARA) 2.5 MG tablet Take 1 tablet (2.5 mg total) by mouth daily.  90 tablet  12  . omeprazole (PRILOSEC) 20 MG capsule Take 20 mg by mouth daily.      Marland Kitchen oxyCODONE (OXY IR/ROXICODONE) 5 MG immediate release tablet Take 1 tablet (5 mg total) by mouth every 4 (four) hours as needed for pain.  90 tablet  0  . potassium chloride SA (K-DUR,KLOR-CON) 20 MEQ tablet Take 1 tablet (20 mEq total) by mouth 2 (two) times daily.  20 tablet  0  . simvastatin (ZOCOR) 20 MG tablet Take 20 mg by mouth daily.       Marland Kitchen spironolactone (ALDACTONE) 25 MG tablet Take 0.5 tablets (12.5 mg total) by mouth daily.  30 tablet  3  . sulfamethoxazole-trimethoprim (BACTRIM DS) 800-160 MG per tablet       . zolpidem (AMBIEN) 10 MG tablet Take 10 mg by mouth at bedtime.       No current facility-administered medications for this visit.   Facility-Administered Medications Ordered in Other Visits  Medication Dose Route Frequency Provider Last Rate Last Dose  . sodium chloride 0.9 % injection 10 mL  10 mL Intracatheter PRN Victorino December, MD   10 mL at 03/14/12 1628    SURGICAL HISTORY:  Past Surgical History  Procedure Laterality Date  . Breast biopsy  1998    left  . Total shoulder replacement  2011    left  . Eus  06/04/2011    Procedure: UPPER ENDOSCOPIC ULTRASOUND (EUS) LINEAR;  Surgeon: Rob Bunting, MD;  Location: [facility];  Service: Endoscopy;  Laterality: N/A;  . Exploratory laparotomy       biopsy of intra-abdominal mass  . Bladder tack    . Tubal ligation    . Appendectomy     . Dilation and curettage of uterus    . Esophagogastroduodenoscopy    . Mastectomy w/ sentinel node biopsy  12/18/2011    Procedure: MASTECTOMY WITH SENTINEL LYMPH NODE BIOPSY;  Surgeon: Emelia Loron, MD;  Location: [facility] OR;  Service: General;  Laterality: Left;  . Portacath placement  01/27/2012    Procedure: INSERTION PORT-A-CATH;  Surgeon: Emelia Loron, MD;  Location: [facility];  Service: General;  Laterality: Right;  PORT PLACEMENT    REVIEW OF SYSTEMS:   General: fatigue (-), night sweats (-), fever (-), pain (-) Lymph: palpable nodes (-) HEENT: vision changes (-), mucositis (-), gum bleeding (-), epistaxis (-) Cardiovascular: chest pain (-), palpitations (-)  Pulmonary: shortness of breath (-), dyspnea on exertion (-), cough (-), hemoptysis (-) GI:  Early satiety (-), melena (-), dysphagia (-), nausea/vomiting (-), diarrhea (-) GU: dysuria (-), hematuria (-), incontinence (-) Musculoskeletal: joint swelling (-), joint pain (+), back pain (-) Neuro: weakness (-), numbness (-), headache (-), confusion (-) Skin: Rash (-), lesions (-), dryness (-) Psych: depression (-), suicidal/homicidal ideation (-), feeling of hopelessness (-)  Health Maintenance  Mammogram: 12/2012 Colonoscopy: Never Bone Density Scan: 10/28/12 Pap Smear: Years Eye Exam: 2 years ago Vitamin D Level: 1/14 Lipid Panel: 2011   PHYSICAL EXAMINATION:  BP 103/70  Pulse 102  Temp(Src) 99.2 F (37.3 C) (Oral)  Resp 20  Ht 4\' 11"  (1.499 m)  Wt 161 lb 9.6 oz (73.301 kg)  BMI 32.62 kg/m2 General: Patient is an elderly appearing female in no acute distress HEENT: PERRLA, sclerae anicteric no conjunctival pallor, MMM Neck: supple, no palpable adenopathy Lungs: clear to auscultation bilaterally, no wheezes, rhonchi, or rales Cardiovascular: regular rate rhythm, S1, S2, systolic murmur, rubs or gallops Abdomen: Soft, non-tender, non-distended, normoactive bowel sounds, no HSM Extremities:  warm and well perfused, no clubbing, cyanosis, or edema Skin: No rash or lesions, previous shingles and superinfection have healed. Neuro: Non-focal Breast: Left mastectomy no nodularity, or local sign of recurrence.  Right breast is without masses ECOG PERFORMANCE STATUS: 1 - Symptomatic but completely ambulatory  LABORATORY DATA: Lab Results  Component Value Date   WBC 6.6 02/08/2013   HGB 10.4* 02/08/2013   HCT 32.1* 02/08/2013   MCV 86.1 02/08/2013   PLT 250 02/08/2013      Chemistry      Component Value Date/Time   NA 140 02/08/2013 1337   NA 139 02/22/2012 1148   K 4.5 02/08/2013 1337   K 4.0 02/22/2012 1148   CL 108* 12/28/2012 1343   CL 106 02/22/2012 1148   CO2 24 02/08/2013 1337   CO2 27 02/22/2012 1148   BUN 19.1 02/08/2013 1337   BUN 15 02/22/2012 1148   CREATININE 0.8 02/08/2013 1337   CREATININE 0.52 02/22/2012 1148      Component Value Date/Time   CALCIUM 9.1 02/08/2013 1337   CALCIUM 8.6 02/22/2012 1148   ALKPHOS 73 02/08/2013 1337   ALKPHOS 59 02/22/2012 1148   AST 17 02/08/2013 1337   AST 21 02/22/2012 1148   ALT 13 02/08/2013 1337   ALT 30 02/22/2012 1148   BILITOT 0.30 02/08/2013 1337   BILITOT 0.2* 02/22/2012 1148    ADDITIONAL INFORMATION: 2. PROGNOSTIC INDICATORS - ACIS Results IMMUNOHISTOCHEMICAL AND MORPHOMETRIC ANALYSIS BY THE AUTOMATED CELLULAR IMAGING SYSTEM (ACIS) THE SMALLER TUMOR SHOWS THE FOLLOWING BREAST PROGNOSTIC PROFILE: Estrogen Receptor (Negative, <1%): 100%,POSITIVE, STRONG STAINING INTENSITY Progesterone Receptor (Negative, <1%): 7%,POSITIVE, STRONG STAINING INTENSITY Proliferation Marker Ki67 by M IB-1 (Low<20%): 50% All controls stained appropriately Abigail Miyamoto MD Pathologist, Electronic Signature ( Signed 12/25/2011) 2. CHROMOGENIC IN-SITU HYBRIDIZATION Interpretation: 0.7 CM SMALLER TUMOR: HER2/NEU BY CISH - SHOWS AMPLIFICATION BY CISH ANALYSIS. THE RATIO OF HER2: CEP 17 SIGNALS WAS 4.94. 1.7 CM LEFT SUPERIOR TUMOR: HER-2/NEU BY CISH - NO  AMPLIFICATION OF HER-2 DETECTED. THE RATIO OF HER-2: CEP 17 SIGNALS WAS 1.14. Reference range: Ratio: HER2:CEP17 < 1.8 - gene amplification not observed Ratio: HER2:CEP 17 1.8-2.2 - equivocal result Ratio: HER2:CEP17 > 2.2 - gene amplification observed 1 of 4 FINAL for Sayler, Ezra S [id number]) ADDITIONAL INFORMATION:(continued) Comment: The cancer center was notified on 12-24-2011. Abigail Miyamoto MD Pathologist, Electronic Signature ( Signed 12/24/2011)  FINAL DIAGNOSIS Diagnosis 1. Lymph node, sentinel, biopsy, Left - METASTATIC CARCINOMA IN 1 OF 1 LYMPH NODE WITH FOCAL CAPSULAR EXTENSION (1/1). 2. Breast, simple mastectomy, Left - INVASIVE DUCTAL CARCINOMA, TWO FOCI, WELL DIFFERENTIATED, SPANNING 1.7 AND 0.7 CM. - DUCTAL CARCINOMA IN SITU. - LYMPHOVASCULAR INVASION IS IDENTIFIED. - THE SURGICAL RESECTION MARGINS ARE NEGATIVE FOR CARCINOMA. - SEE ONCOLOGY TABLE BELOW. 3. Breast, biopsy, left, additional tissue - BENIGN SKIN AND SUBCUTANEOUS TISSUE. - THERE IS NO EVIDENCE OF MALIGNANCY. Microscopic Comment 2. BREAST, INVASIVE TUMOR, WITH LYMPH NODE SAMPLING Specimen, including laterality: Left breast. Procedure: Simple mastectomy Grade: Both are I Tubule formation: 1 and 1 Nuclear pleomorphism: 1 and 2 Mitotic:1 and 1 Tumor size (gross measurement): 1.7 and 0.7 cm Margins: Negative for carcinoma Invasive, distance to closest margin: 2.0 cm to the deep margin (gross measurement) In-situ, distance to closest margin: 2.0 cm from the deep margin (gross measurement). Lymphovascular invasion: Present. Ductal carcinoma in situ: Present. Grade: Intermediate grade. Extensive intraductal component: No. Lobular neoplasia: Not identified. Tumor focality: Two foci. Treatment effect: N/A Extent of tumor: Confined to breast parenchyma Lymph nodes: # examined: 1 Lymph nodes with metastasis: 1 Isolated tumor cells (< 0.2 mm): 0 Micrometastasis: (> 0.2 mm and < 2.0 mm):  0 Macrometastasis: (> 2.0 mm): 1 Extracapsular extension: Present, focal. 2 of 4 FINAL for Venuti, Libbi S [id number]) Microscopic Comment(continued) Breast prognostic profile: [id number] Estrogen receptor: Positive (100%, strong staining intensity). Progesterone receptor: Positive (100%, strong staining intensity. Her 2 neu: No amplification was detected. The ratio was 1.45. Her 2 neu by CISH will be repeated on both tumors and the results reported separately. Ki-67: 13% Non-neoplastic breast: Healing biopsy site. TNM: mpT1c, pN1a Comments: Grossly, there are two foci of grade I invasive ductal carcinoma present in the simple mastectomy. The larger nodule spans 1.7 cm and is histologically identical to the previous core biopsy, [phone] ("left superior"). The second nodule, 0.7 cm, is grade I invasive ductal carcinoma with extracellular mucin. A complete profile will be performed on this second, smaller nodule. (JBK:gt, 12/22/11) Pecola Leisure MD Pathologist, Electronic Signature   RADIOGRAPHIC STUDIES:  No results found.  ASSESSMENT: 78 year old female with:  #1 new diagnosis of stage II invasive ductal carcinoma of the left breast she is status post mastectomy with finding of 2 foci of invasive ductal carcinoma both are ER/PR positive but the smaller focus measuring 0.7 cm is HER-2/neu positive with a ratio over 4.  #2 patient was begun on adjuvant chemotherapy and Herceptin. With chemotherapy consisting of Taxol given weekly with weekly Herceptin. She received her first cycle on 02/08/2012- 10/13.  Patient subsequently could not tolerate the Taxol so therefore this was discontinued.  #3 she was then begun on Herceptin every 3 weeks beginning on 05/02/12.  #3 Anemia due to chemotherapy  #4 Osteopenia--Prolia  PLAN: 1. Proceed with herceptin and continue daily Femara. She had an appt with Dr. Gala Romney on 7/14 and he cleared her to continue with Herceptin.    2.  Patient and I discussed her port and port removal.  It was placed by Dr. Dwain Sarna.  For now, she would like to keep it and we will re-visit the topic at her next follow up appointment.    3. For the osteopenia, she will take calcium, vitamin d and receives Prolia every 6 months.  Her last Vitamin D level was low, she was prescribed ergocalciferol weekly for four weeks, after this she will take Vitamin D3 2000 units daily.  4. Her last mammo was in June and was normal.    5. She will return in 8 weeks for port flush, and in 4 months for labs, an office visit, port flush, and Prolia.  We will re-visit port removal at that time.     She knows to call with any questions or concerns before her next appointment.    I spent 25 minutes counseling the patient face to face. The total time spent in the appointment was 30 minutes.  Cherie Ouch Lyn Hollingshead, NP Medical Oncology [facility] Phone: [phone]

## 2013-02-08 NOTE — Patient Instructions (Addendum)
Doing well.  Proceed with Herceptin.  Continue Femara.  Please call us if you have any questions or concerns.

## 2013-02-08 NOTE — Patient Instructions (Addendum)
[facility] Discharge Instructions for Patients Receiving Chemotherapy  Today you received the following chemotherapy agents:  herceptin  To help prevent nausea and vomiting after your treatment, we encourage you to take your nausea medication.  If you develop nausea and vomiting that is not controlled by your nausea medication, call the clinic.   BELOW ARE SYMPTOMS THAT SHOULD BE REPORTED IMMEDIATELY:  *FEVER GREATER THAN 100.5 F  *CHILLS WITH OR WITHOUT FEVER  NAUSEA AND VOMITING THAT IS NOT CONTROLLED WITH YOUR NAUSEA MEDICATION  *UNUSUAL SHORTNESS OF BREATH  *UNUSUAL BRUISING OR BLEEDING  TENDERNESS IN MOUTH AND THROAT WITH OR WITHOUT PRESENCE OF ULCERS  *URINARY PROBLEMS  *BOWEL PROBLEMS  UNUSUAL RASH Items with * indicate a potential emergency and should be followed up as soon as possible.  Feel free to call the clinic you have any questions or concerns. The clinic phone number is [phone].    

## 2013-02-14 ENCOUNTER — Other Ambulatory Visit: Payer: Self-pay | Admitting: *Deleted

## 2013-02-14 DIAGNOSIS — C50412 Malignant neoplasm of upper-outer quadrant of left female breast: Secondary | ICD-10-CM

## 2013-02-14 MED ORDER — GABAPENTIN 100 MG PO CAPS: ORAL_CAPSULE | ORAL | Status: DC

## 2013-04-10 ENCOUNTER — Ambulatory Visit (HOSPITAL_BASED_OUTPATIENT_CLINIC_OR_DEPARTMENT_OTHER): Payer: Medicare Other

## 2013-04-10 VITALS — BP 129/44 | HR 66 | Temp 98.0°F

## 2013-04-10 DIAGNOSIS — C50919 Malignant neoplasm of unspecified site of unspecified female breast: Secondary | ICD-10-CM

## 2013-04-10 DIAGNOSIS — Z452 Encounter for adjustment and management of vascular access device: Secondary | ICD-10-CM

## 2013-04-10 MED ORDER — HEPARIN SOD (PORK) LOCK FLUSH 100 UNIT/ML IV SOLN
500.0000 [IU] | Freq: Once | INTRAVENOUS | Status: AC
  Administered 2013-04-10: 500 [IU] via INTRAVENOUS
  Filled 2013-04-10: qty 5

## 2013-04-10 MED ORDER — SODIUM CHLORIDE 0.9 % IJ SOLN
10.0000 mL | INTRAMUSCULAR | Status: DC | PRN
  Administered 2013-04-10: 10 mL via INTRAVENOUS
  Filled 2013-04-10: qty 10

## 2013-04-19 ENCOUNTER — Ambulatory Visit (HOSPITAL_BASED_OUTPATIENT_CLINIC_OR_DEPARTMENT_OTHER)
Admission: RE | Admit: 2013-04-19 | Discharge: 2013-04-19 | Disposition: A | Discharge status: Home / Self Care | Payer: Medicare Other | Source: Ambulatory Visit | Attending: Family Medicine | Admitting: Family Medicine

## 2013-04-19 ENCOUNTER — Ambulatory Visit (HOSPITAL_COMMUNITY)
Admission: RE | Admit: 2013-04-19 | Discharge: 2013-04-19 | Disposition: A | Discharge status: Home / Self Care | Payer: Medicare Other | Source: Ambulatory Visit | Attending: Family Medicine | Admitting: Family Medicine

## 2013-04-19 VITALS — BP 154/83

## 2013-04-19 DIAGNOSIS — E785 Hyperlipidemia, unspecified: Secondary | ICD-10-CM | POA: Insufficient documentation

## 2013-04-19 DIAGNOSIS — K219 Gastro-esophageal reflux disease without esophagitis: Secondary | ICD-10-CM | POA: Insufficient documentation

## 2013-04-19 DIAGNOSIS — C50919 Malignant neoplasm of unspecified site of unspecified female breast: Secondary | ICD-10-CM

## 2013-04-19 DIAGNOSIS — I359 Nonrheumatic aortic valve disorder, unspecified: Secondary | ICD-10-CM

## 2013-04-19 DIAGNOSIS — I080 Rheumatic disorders of both mitral and aortic valves: Secondary | ICD-10-CM | POA: Insufficient documentation

## 2013-04-19 DIAGNOSIS — Z923 Personal history of irradiation: Secondary | ICD-10-CM | POA: Insufficient documentation

## 2013-04-19 DIAGNOSIS — I10 Essential (primary) hypertension: Secondary | ICD-10-CM

## 2013-04-19 DIAGNOSIS — Z853 Personal history of malignant neoplasm of breast: Secondary | ICD-10-CM | POA: Insufficient documentation

## 2013-04-19 DIAGNOSIS — I350 Nonrheumatic aortic (valve) stenosis: Secondary | ICD-10-CM

## 2013-04-19 MED ORDER — SPIRONOLACTONE 25 MG PO TABS: 12.5000 mg | ORAL_TABLET | Freq: Every day | ORAL | Status: DC

## 2013-04-19 NOTE — Progress Notes (Signed)
Patient ID: Brandy [name], female   DOB: 14-Sep-1932, 79 y.o.   MRN: [id number] Referring Physician: Dr. Welton Flakes Primary Care: Dr. Sigmund Hazel Primary Cardiologist: None  HPI: Brandy [name] is a 79 y.o. female with history of HTN, HL, GERD, and depression.  She states she had a stress test at one point in time which was normal.    Diagnosed with stage II invasive ductal carcinoma of the left breast and status post mastectomy with finding of 2 foci of invasive ductal carcinoma both are ER/PR positive but the smaller focus measuring 0.7 cm is HER-2/neu positive with a ratio over 4.  After discussion with Dr. Welton Flakes she started Taxol/herceptin on February 09, 2012 for a year. She competed Herceptin in August 2014.   ECHOs: 01/04/12: EF 60-65%, lateral s' 7.9 07/11/12 EF 60-65% Lateral 8.0 10/17/12 EF 60% Lateral S' 8.0 Mild AS 01/16/13 EF 60-65%, mild AS Lateral S' 11.8  04/19/13 EF60-65% mild AS  Lateral S' 10.1   She returns for follow up.  Denies SOB/PND/Orthopnea. SBP at home 130s. Completed herceptin in August 2014.    Labs  12/28/2012 K+ 3.9, Cr 0.8 02/08/13 K 4.6 Creatinine 0.8  Past Medical History  Diagnosis Date  . Asthma   . Hyperlipidemia     takes Simvastatin daily  . Hypertension     takes Amlodipine and Diovan daily  . Cancer     left breast  . Bronchitis     hx of;last time >69yr ago  . Arthritis     shoulders  . Dysphagia   . H/O hiatal hernia   . GERD (gastroesophageal reflux disease)     takes Prilosec prn  . Urinary urgency   . History of kidney stones     many yrs ago  . Depression     takes Celexa daily  . Insomnia     takes Ambien nightly  . Breast cancer 12/18/11    left breast masectomy=metastatic ca in (1/1) lymph node ,invasive ductal ca,2 foci,,dcis,lymph ovascular invasion identified,surgical resection margins neg for ca,additional tissue=benign skin and subcutaneous tissue  . Breast CA 11/23/11    left breast 3 o'clock bx=high grade ductal ca in situ  w/comedononecrosis and calcification,dcis,lymphovascular invasion present ,ER?PR+positive  . Full dentures   . Hx of radiation therapy 04/26/12 -06/10/12    left breast    Current Outpatient Prescriptions  Medication Sig Dispense Refill  . amLODipine (NORVASC) 10 MG tablet Take 10 mg by mouth daily.       . Calcium Carbonate-Vitamin D (CALCIUM + D PO) Take by mouth daily.      . citalopram (CELEXA) 20 MG tablet Take 20 mg by mouth daily.      Marland Kitchen DIOVAN HCT 160-12.5 MG per tablet Take 2 tablets by mouth every morning.       . ergocalciferol (VITAMIN D2) 50000 UNITS capsule Take 1 capsule (50,000 Units total) by mouth once a week.  4 capsule  0  . gabapentin (NEURONTIN) 100 MG capsule TAKE 3 CAPSULES AT BEDTIME  90 capsule  3  . letrozole (FEMARA) 2.5 MG tablet Take 1 tablet (2.5 mg total) by mouth daily.  90 tablet  12  . omeprazole (PRILOSEC) 20 MG capsule Take 20 mg by mouth daily.      Marland Kitchen oxyCODONE (OXY IR/ROXICODONE) 5 MG immediate release tablet Take 1 tablet (5 mg total) by mouth every 4 (four) hours as needed for pain.  90 tablet  0  . simvastatin (ZOCOR) 20 MG  tablet Take 20 mg by mouth daily.       Marland Kitchen spironolactone (ALDACTONE) 25 MG tablet Take 0.5 tablets (12.5 mg total) by mouth daily.  30 tablet  3  . sulfamethoxazole-trimethoprim (BACTRIM DS) 800-160 MG per tablet       . zolpidem (AMBIEN) 10 MG tablet Take 10 mg by mouth at bedtime.      . potassium chloride SA (K-DUR,KLOR-CON) 20 MEQ tablet Take 1 tablet (20 mEq total) by mouth 2 (two) times daily.  20 tablet  0   No current facility-administered medications for this encounter.   Facility-Administered Medications Ordered in Other Encounters  Medication Dose Route Frequency Provider Last Rate Last Dose  . sodium chloride 0.9 % injection 10 mL  10 mL Intracatheter PRN Victorino December, MD   10 mL at 03/14/12 1628    No Known Allergies  History   Social History  . Marital Status: Married    Spouse Name: N/A    Number of  Children: N/A  . Years of Education: N/A   Occupational History  . Not on file.   Social History Main Topics  . Smoking status: Never Smoker   . Smokeless tobacco: Never Used  . Alcohol Use: No  . Drug Use: No  . Sexual Activity: Yes    Birth Control/ Protection: Surgical     Comment: mensus age 89, 1st pregnancy 50, no hrt gg4,p3, 1 babay lived a few hours complications   Other Topics Concern  . Not on file   Social History Narrative  . No narrative on file    Family History  Problem Relation Age of Onset  . Cancer Father     prostate  . Cancer Other     breast ca    PHYSICAL EXAM: Filed Vitals:   04/19/13 1358  BP: 154/83    General:  Eldery appearing. No respiratory difficulty HEENT: normal Neck: supple. JVP 6. Carotids 2+ bilat; Radiated bruits bilateral . No lymphadenopathy or thryomegaly appreciated. Cor: PMI nondisplaced. Regular rate & rhythm. No rubs, gallops. 2/6 AS murmur. Crisp S2 Lungs: clear Abdomen: soft, nontender, nondistended. No hepatosplenomegaly. No bruits or masses. Good bowel sounds. Extremities: no cyanosis, clubbing, rash, edema Neuro: alert & oriented x 3, cranial nerves grossly intact. moves all 4 extremities w/o difficulty. Affect pleasant.    ASSESSMENT & PLAN: 1. Breast Cancer: Completed Herceptin 02/2013. Reviewed ECHO today and EF 60-65%, mild AS  2. HTN: Stable.SBP at home . Continue current regimen. PCP to follow.  3. AS: Mild, will continue to follow. F/U yearly.   Follow up yearly for AS.   Brandy [name] 04/19/2013  Patient seen and examined with Brandy Becket, NP. We discussed all aspects of the encounter. I agree with the assessment and plan as stated above. Echo reviewed personally. EF stable. Now finished Herceptin. Echo had mild AS. Will schedule yearly f/u. BP elevated here but normal at home. Will not change regimen.   Brandy Bensimhon,MD 4:01 PM

## 2013-04-19 NOTE — Progress Notes (Signed)
  Echocardiogram 2D Echocardiogram has been performed.  [place]ian Co 04/19/2013, 3:01 PM

## 2013-06-08 ENCOUNTER — Encounter: Payer: Self-pay | Admitting: Oncology

## 2013-06-08 ENCOUNTER — Other Ambulatory Visit (HOSPITAL_BASED_OUTPATIENT_CLINIC_OR_DEPARTMENT_OTHER): Payer: Medicare Other | Admitting: Lab

## 2013-06-08 ENCOUNTER — Ambulatory Visit (HOSPITAL_BASED_OUTPATIENT_CLINIC_OR_DEPARTMENT_OTHER): Payer: Medicare Other

## 2013-06-08 ENCOUNTER — Telehealth: Payer: Self-pay | Admitting: *Deleted

## 2013-06-08 ENCOUNTER — Ambulatory Visit (HOSPITAL_BASED_OUTPATIENT_CLINIC_OR_DEPARTMENT_OTHER): Payer: Medicare Other | Admitting: Oncology

## 2013-06-08 VITALS — BP 124/66 | HR 80 | Temp 98.6°F | Resp 18 | Ht 59.0 in | Wt 164.1 lb

## 2013-06-08 DIAGNOSIS — M899 Disorder of bone, unspecified: Secondary | ICD-10-CM

## 2013-06-08 DIAGNOSIS — C50919 Malignant neoplasm of unspecified site of unspecified female breast: Secondary | ICD-10-CM

## 2013-06-08 DIAGNOSIS — E559 Vitamin D deficiency, unspecified: Secondary | ICD-10-CM

## 2013-06-08 DIAGNOSIS — C50412 Malignant neoplasm of upper-outer quadrant of left female breast: Secondary | ICD-10-CM

## 2013-06-08 DIAGNOSIS — Z23 Encounter for immunization: Secondary | ICD-10-CM

## 2013-06-08 DIAGNOSIS — D6481 Anemia due to antineoplastic chemotherapy: Secondary | ICD-10-CM

## 2013-06-08 DIAGNOSIS — C50419 Malignant neoplasm of upper-outer quadrant of unspecified female breast: Secondary | ICD-10-CM

## 2013-06-08 DIAGNOSIS — Z170 Estrogen receptor positive status [ER+]: Secondary | ICD-10-CM

## 2013-06-08 DIAGNOSIS — M8580 Other specified disorders of bone density and structure, unspecified site: Secondary | ICD-10-CM

## 2013-06-08 DIAGNOSIS — R52 Pain, unspecified: Secondary | ICD-10-CM

## 2013-06-08 DIAGNOSIS — Z95828 Presence of other vascular implants and grafts: Secondary | ICD-10-CM

## 2013-06-08 LAB — COMPREHENSIVE METABOLIC PANEL (CC13)
AST: 17 U/L (ref 5–34)
Albumin: 3.2 g/dL — ABNORMAL LOW (ref 3.5–5.0)
Alkaline Phosphatase: 70 U/L (ref 40–150)
Anion Gap: 10 mEq/L (ref 3–11)
Calcium: 9.8 mg/dL (ref 8.4–10.4)
Chloride: 107 mEq/L (ref 98–109)
Glucose: 101 mg/dl (ref 70–140)
Potassium: 4.2 mEq/L (ref 3.5–5.1)
Sodium: 140 mEq/L (ref 136–145)
Total Bilirubin: 0.2 mg/dL (ref 0.20–1.20)
Total Protein: 7.2 g/dL (ref 6.4–8.3)

## 2013-06-08 LAB — CBC WITH DIFFERENTIAL/PLATELET
Eosinophils Absolute: 0.2 10*3/uL (ref 0.0–0.5)
HCT: 33 % — ABNORMAL LOW (ref 34.8–46.6)
LYMPH%: 27.4 % (ref 14.0–49.7)
MONO#: 0.6 10*3/uL (ref 0.1–0.9)
NEUT#: 5.4 10*3/uL (ref 1.5–6.5)
Platelets: 343 10*3/uL (ref 145–400)
RBC: 3.77 10*6/uL (ref 3.70–5.45)
WBC: 8.6 10*3/uL (ref 3.9–10.3)

## 2013-06-08 MED ORDER — SODIUM CHLORIDE 0.9 % IJ SOLN
10.0000 mL | INTRAMUSCULAR | Status: DC | PRN
  Administered 2013-06-08: 10 mL via INTRAVENOUS
  Filled 2013-06-08: qty 10

## 2013-06-08 MED ORDER — OXYCODONE HCL 5 MG PO TABS: 5.0000 mg | ORAL_TABLET | ORAL | Status: DC | PRN

## 2013-06-08 MED ORDER — INFLUENZA VAC SPLIT QUAD 0.5 ML IM SUSP
0.5000 mL | Freq: Once | INTRAMUSCULAR | Status: AC
  Administered 2013-06-08: 0.5 mL via INTRAMUSCULAR
  Filled 2013-06-08: qty 0.5

## 2013-06-08 MED ORDER — DENOSUMAB 60 MG/ML ~~LOC~~ SOLN
60.0000 mg | Freq: Once | SUBCUTANEOUS | Status: AC
  Administered 2013-06-08: 60 mg via SUBCUTANEOUS
  Filled 2013-06-08: qty 1

## 2013-06-08 MED ORDER — HEPARIN SOD (PORK) LOCK FLUSH 100 UNIT/ML IV SOLN
500.0000 [IU] | Freq: Once | INTRAVENOUS | Status: AC
  Administered 2013-06-08: 500 [IU] via INTRAVENOUS
  Filled 2013-06-08: qty 5

## 2013-06-08 NOTE — Patient Instructions (Signed)
Denosumab injection What is this medicine? DENOSUMAB (den oh sue mab) slows bone breakdown. Prolia is used to treat osteoporosis in women after menopause and in men. Xgeva is used to prevent bone fractures and other bone problems caused by cancer bone metastases. Xgeva is also used to treat giant cell tumor of the bone. This medicine may be used for other purposes; ask your health care provider or pharmacist if you have questions. COMMON BRAND NAME(S): Prolia, XGEVA What should I tell my health care provider before I take this medicine? They need to know if you have any of these conditions: -dental disease -eczema -infection or history of infections -kidney disease or on dialysis -low blood calcium or vitamin D -malabsorption syndrome -scheduled to have surgery or tooth extraction -taking medicine that contains denosumab -thyroid or parathyroid disease -an unusual reaction to denosumab, other medicines, foods, dyes, or preservatives -pregnant or trying to get pregnant -breast-feeding How should I use this medicine? This medicine is for injection under the skin. It is given by a health care professional in a hospital or clinic setting. If you are getting Prolia, a special MedGuide will be given to you by the pharmacist with each prescription and refill. Be sure to read this information carefully each time. For Prolia, talk to your pediatrician regarding the use of this medicine in children. Special care may be needed. For Xgeva, talk to your pediatrician regarding the use of this medicine in children. While this drug may be prescribed for children as young as 13 years for selected conditions, precautions do apply. Overdosage: If you think you've taken too much of this medicine contact a poison control center or emergency room at once. Overdosage: If you think you have taken too much of this medicine contact a poison control center or emergency room at once. NOTE: This medicine is only for  you. Do not share this medicine with others. What if I miss a dose? It is important not to miss your dose. Call your doctor or health care professional if you are unable to keep an appointment. What may interact with this medicine? Do not take this medicine with any of the following medications: -other medicines containing denosumab This medicine may also interact with the following medications: -medicines that suppress the immune system -medicines that treat cancer -steroid medicines like prednisone or cortisone This list may not describe all possible interactions. Give your health care provider a list of all the medicines, herbs, non-prescription drugs, or dietary supplements you use. Also tell them if you smoke, drink alcohol, or use illegal drugs. Some items may interact with your medicine. What should I watch for while using this medicine? Visit your doctor or health care professional for regular checks on your progress. Your doctor or health care professional may order blood tests and other tests to see how you are doing. Call your doctor or health care professional if you get a cold or other infection while receiving this medicine. Do not treat yourself. This medicine may decrease your body's ability to fight infection. You should make sure you get enough calcium and vitamin D while you are taking this medicine, unless your doctor tells you not to. Discuss the foods you eat and the vitamins you take with your health care professional. See your dentist regularly. Brush and floss your teeth as directed. Before you have any dental work done, tell your dentist you are receiving this medicine. Do not become pregnant while taking this medicine or for 5 months after stopping   it. Women should inform their doctor if they wish to become pregnant or think they might be pregnant. There is a potential for serious side effects to an unborn child. Talk to your health care professional or pharmacist for more  information. What side effects may I notice from receiving this medicine? Side effects that you should report to your doctor or health care professional as soon as possible: -allergic reactions like skin rash, itching or hives, swelling of the face, lips, or tongue -breathing problems -chest pain -fast, irregular heartbeat -feeling faint or lightheaded, falls -fever, chills, or any other sign of infection -muscle spasms, tightening, or twitches -numbness or tingling -skin blisters or bumps, or is dry, peels, or red -slow healing or unexplained pain in the mouth or jaw -unusual bleeding or bruising Side effects that usually do not require medical attention (Report these to your doctor or health care professional if they continue or are bothersome.): -muscle pain -stomach upset, gas This list may not describe all possible side effects. Call your doctor for medical advice about side effects. You may report side effects to FDA at 1-800-FDA-1088. Where should I keep my medicine? This medicine is only given in a clinic, doctor's office, or other health care setting and will not be stored at home. NOTE: This sheet is a summary. It may not cover all possible information. If you have questions about this medicine, talk to your doctor, pharmacist, or health care provider.  2014, [employer]. (2011-12-21 12:37:47)    Implanted Port Instructions An implanted port is a central line that has a round shape and is placed under the skin. It is used for long-term IV (intravenous) access for:  Medicine.  Fluids.  Liquid nutrition, such as TPN (total parenteral nutrition).  Blood samples. Ports can be placed:  In the chest area just below the collarbone (this is the most common place.)  In the arms.  In the belly (abdomen) area.  In the legs. PARTS OF THE PORT A port has 2 main parts:  The reservoir. The reservoir is round, disc-shaped, and will be a small, raised area under  your skin.  The reservoir is the part where a needle is inserted (accessed) to either give medicines or to draw blood.  The catheter. The catheter is a long, slender tube that extends from the reservoir. The catheter is placed into a large vein.  Medicine that is inserted into the reservoir goes into the catheter and then into the vein. INSERTION OF THE PORT  The port is surgically placed in either an operating room or in a procedural area (interventional radiology).  Medicine may be given to help you relax during the procedure.  The skin where the port will be inserted is numbed (local anesthetic).  1 or 2 small cuts (incisions) will be made in the skin to insert the port.  The port can be used after it has been inserted. INCISION SITE CARE  The incision site may have small adhesive strips on it. This helps keep the incision site closed. Sometimes, no adhesive strips are placed. Instead of adhesive strips, a special kind of surgical glue is used to keep the incision closed.  If adhesive strips were placed on the incision sites, do not take them off. They will fall off on their own.  The incision site may be sore for 1 to 2 days. Pain medicine can help.  Do not get the incision site wet. Bathe or shower as directed by your caregiver.  The incision site should heal in 5 to 7 days. A small scar may form after the incision has healed. ACCESSING THE PORT Special steps must be taken to access the port:  Before the port is accessed, a numbing cream can be placed on the skin. This helps numb the skin over the port site.  A sterile technique is used to access the port.  The port is accessed with a needle. Only "non-coring" port needles should be used to access the port. Once the port is accessed, a blood return should be checked. This helps ensure the port is in the vein and is not clogged (clotted).  If your caregiver believes your port should remain accessed, a clear (transparent)  bandage will be placed over the needle site. The bandage and needle will need to be changed every week or as directed by your caregiver.  Keep the bandage covering the needle clean and dry. Do not get it wet. Follow your caregiver's instructions on how to take a shower or bath when the port is accessed.  If your port does not need to stay accessed, no bandage is needed over the port. FLUSHING THE PORT Flushing the port keeps it from getting clogged. How often the port is flushed depends on:  If a constant infusion is running. If a constant infusion is running, the port may not need to be flushed.  If intermittent medicines are given.  If the port is not being used. For intermittent medicines:  The port will need to be flushed:  After medicines have been given.  After blood has been drawn.  As part of routine maintenance.  A port is normally flushed with:  Normal saline.  Heparin.  Follow your caregiver's advice on how often, how much, and the type of flush to use on your port. IMPORTANT PORT INFORMATION  Tell your caregiver if you are allergic to heparin.  After your port is placed, you will get a manufacturer's information card. The card has information about your port. Keep this card with you at all times.  There are many types of ports available. Know what kind of port you have.  In case of an emergency, it may be helpful to wear a medical alert bracelet. This can help alert health care workers that you have a port.  The port can stay in for as long as your caregiver believes it is necessary.  When it is time for the port to come out, surgery will be done to remove it. The surgery will be similar to how the port was put in.  If you are in the hospital or clinic:  Your port will be taken care of and flushed by a nurse.  If you are at home:  A home health care nurse may give medicines and take care of the port.  You or a family member can get special training and  directions for giving medicine and taking care of the port at home. SEEK IMMEDIATE MEDICAL CARE IF:   Your port does not flush or you are unable to get a blood return.  New drainage or pus is coming from the incision.  A bad smell is coming from the incision site.  You develop swelling or increased redness at the incision site.  You develop increased swelling or pain at the port site.  You develop swelling or pain in the surrounding skin near the port.  You have an oral temperature above 102 F (38.9 C), not controlled by  medicine. MAKE SURE YOU:   Understand these instructions.  Will watch your condition.  Will get help right away if you are not doing well or get worse. Document Released: 06/22/2005 Document Revised: 09/14/2011 Document Reviewed: 09/13/2008 [facility] Patient Information 2014 [place], [place].

## 2013-06-08 NOTE — Telephone Encounter (Signed)
appts made and printed...td 

## 2013-06-08 NOTE — Progress Notes (Signed)
OFFICE PROGRESS NOTE  CC  [name], MD [address] [place] [address] Dr. Emelia Loron Dr. Antony Blackbird  DIAGNOSIS:  79 year old female with new diagnosis of multifocal breast cancer of the left breast. Patient is status post mastectomy with sentinel lymph node biopsy performed on 12/18/2011.   Stage:  Left breast:  T1 N1 MX Invasive ductal carcinoma, multifocal ER/PR/HER-2/neu positive  PRIOR THERAPY: #1 patient originally presented with an suspicious abdominal mass. However multiple endoscopic biopsies revealed no malignancy.   #2 in May 2013 patient was diagnosed with multifocal breast cancer of the left breast. She underwent a mammogram after she felt a mass in the left breast. The mammogram showed an abnormality.biopsy was performed and she was found to have an ace invasive ductal carcinoma. She underwent a mastectomy on 12/18/2011. The final pathology revealed 2 foci of well-differentiated invasive ductal carcinoma one measuring 1.7 cm and the second measuring 0.7 cm there was ductal carcinoma in situ lymphovascular invasion was also identified all surgical margins were negative for carcinoma. Patient went on to also have a left sentinel node biopsy one sentinel node was positive for metastatic disease with focal capsular extension. The tumor was estrogen receptor positive progesterone receptor positive. The largest tumor measuring 1.7 cm was HER-2/neu negative with a Ki-67 of 13%. The smaller tumor showed ER +100% PR receptor 7% proliferation marker 50% and it was HER-2/neu positive with a ratio 4.94.  #5 status post combination chemotherapy consisting of Taxol and Herceptin starting on 02/09/2012 - 04/04/2012. This was discontinued early due to to significant toxicity from the Taxol.  #6 status post adjuvant Herceptin every 3 weeks starting 04/11/2012 through 02/08/2013.  #6 status post Radiation therapy from 04/26/12 through 06/10/12.    #7 She was  started on adjuvant Letrozole in 06/2012. A total of 5 years of therapy is planned.    #8 bone density performed in April 2014 revealed osteopenia. Recommended prolia every 6 months. Risks benefits side effects discussed with the patient  CURRENT THERAPY: letrozole 2.5 mg daily  INTERVAL HISTORY: Brandy [name] 79 y.o. female returns for followup visit today. Overall patient is doing well. She is still tired still has some residual neuropathy. She denies any fevers chills night sweats. She does have some aches and pains. She is concerned about the mastectomy site however examination does not reveal any local evidence of recurrent disease. She also has noted a bulge neither her abdominal surgical site. I do think it is just a lipoma. She has no bleeding problems. Patient is no longer working. She's been eating well she has no weight loss. She is now change in her bowel or bladder habits.  MEDICAL HISTORY: Past Medical History  Diagnosis Date  . Asthma   . Hyperlipidemia     takes Simvastatin daily  . Hypertension     takes Amlodipine and Diovan daily  . Cancer     left breast  . Bronchitis     hx of;last time >2yr ago  . Arthritis     shoulders  . Dysphagia   . H/O hiatal hernia   . GERD (gastroesophageal reflux disease)     takes Prilosec prn  . Urinary urgency   . History of kidney stones     many yrs ago  . Depression     takes Celexa daily  . Insomnia     takes Ambien nightly  . Breast cancer 12/18/11    left breast masectomy=metastatic ca in (1/1) lymph node ,  invasive ductal ca,2 foci,,dcis,lymph ovascular invasion identified,surgical resection margins neg for ca,additional tissue=benign skin and subcutaneous tissue  . Breast CA 11/23/11    left breast 3 o'clock bx=high grade ductal ca in situ w/comedononecrosis and calcification,dcis,lymphovascular invasion present ,ER?PR+positive  . Full dentures   . Hx of radiation therapy 04/26/12 -06/10/12    left breast     ALLERGIES:  has No Known Allergies.  MEDICATIONS:  Current Outpatient Prescriptions  Medication Sig Dispense Refill  . amLODipine (NORVASC) 10 MG tablet Take 10 mg by mouth daily.       . Calcium Carbonate-Vitamin D (CALCIUM + D PO) Take by mouth daily.      . citalopram (CELEXA) 20 MG tablet Take 20 mg by mouth daily.      Marland Kitchen DIOVAN HCT 160-12.5 MG per tablet Take 2 tablets by mouth every morning.       . ergocalciferol (VITAMIN D2) 50000 UNITS capsule Take 1 capsule (50,000 Units total) by mouth once a week.  4 capsule  0  . gabapentin (NEURONTIN) 100 MG capsule TAKE 3 CAPSULES AT BEDTIME  90 capsule  3  . letrozole (FEMARA) 2.5 MG tablet Take 1 tablet (2.5 mg total) by mouth daily.  90 tablet  12  . omeprazole (PRILOSEC) 20 MG capsule Take 20 mg by mouth daily.      . simvastatin (ZOCOR) 20 MG tablet Take 20 mg by mouth daily.       Marland Kitchen spironolactone (ALDACTONE) 25 MG tablet Take 0.5 tablets (12.5 mg total) by mouth daily.  30 tablet  6  . zolpidem (AMBIEN) 10 MG tablet Take 10 mg by mouth at bedtime.      Marland Kitchen oxyCODONE (OXY IR/ROXICODONE) 5 MG immediate release tablet Take 1 tablet (5 mg total) by mouth every 4 (four) hours as needed.  90 tablet  0   No current facility-administered medications for this visit.   Facility-Administered Medications Ordered in Other Visits  Medication Dose Route Frequency Provider Last Rate Last Dose  . sodium chloride 0.9 % injection 10 mL  10 mL Intracatheter PRN Victorino December, MD   10 mL at 03/14/12 1628  . sodium chloride 0.9 % injection 10 mL  10 mL Intravenous PRN Victorino December, MD   10 mL at 06/08/13 1556    SURGICAL HISTORY:  Past Surgical History  Procedure Laterality Date  . Breast biopsy  1998    left  . Total shoulder replacement  2011    left  . Eus  06/04/2011    Procedure: UPPER ENDOSCOPIC ULTRASOUND (EUS) LINEAR;  Surgeon: Rob Bunting, MD;  Location: [facility];  Service: Endoscopy;  Laterality: N/A;  . Exploratory  laparotomy       biopsy of intra-abdominal mass  . Bladder tack    . Tubal ligation    . Appendectomy    . Dilation and curettage of uterus    . Esophagogastroduodenoscopy    . Mastectomy w/ sentinel node biopsy  12/18/2011    Procedure: MASTECTOMY WITH SENTINEL LYMPH NODE BIOPSY;  Surgeon: Emelia Loron, MD;  Location: [facility];  Service: General;  Laterality: Left;  . Portacath placement  01/27/2012    Procedure: INSERTION PORT-A-CATH;  Surgeon: Emelia Loron, MD;  Location: [facility];  Service: General;  Laterality: Right;  PORT PLACEMENT    REVIEW OF SYSTEMS:   General: fatigue (-), night sweats (-), fever (-), pain (-) Lymph: palpable nodes (-) HEENT: vision changes (-), mucositis (-), gum bleeding (-),  epistaxis (-) Cardiovascular: chest pain (-), palpitations (-) Pulmonary: shortness of breath (-), dyspnea on exertion (-), cough (-), hemoptysis (-) GI:  Early satiety (-), melena (-), dysphagia (-), nausea/vomiting (-), diarrhea (-) GU: dysuria (-), hematuria (-), incontinence (-) Musculoskeletal: joint swelling (-), joint pain (+), back pain (-) Neuro: weakness (-), numbness (-), headache (-), confusion (-) Skin: Rash (-), lesions (-), dryness (-) Psych: depression (-), suicidal/homicidal ideation (-), feeling of hopelessness (-)  Health Maintenance  Mammogram: 12/2012 Colonoscopy: Never Bone Density Scan: 10/28/12 Pap Smear: Years Eye Exam: 2 years ago Vitamin D Level: 1/14 Lipid Panel: 2011   PHYSICAL EXAMINATION:  BP 124/66  Pulse 80  Temp(Src) 98.6 F (37 C) (Oral)  Resp 18  Ht 4\' 11"  (1.499 m)  Wt 164 lb 1.6 oz (74.435 kg)  BMI 33.13 kg/m2 General: Patient is an elderly appearing female in no acute distress HEENT: PERRLA, sclerae anicteric no conjunctival pallor, MMM Neck: supple, no palpable adenopathy Lungs: clear to auscultation bilaterally, no wheezes, rhonchi, or rales Cardiovascular: regular rate rhythm, S1, S2, systolic murmur,  rubs or gallops Abdomen: Soft, non-tender, non-distended, normoactive bowel sounds, no HSM Extremities: warm and well perfused, no clubbing, cyanosis, or edema Skin: No rash or lesions, previous shingles and superinfection have healed. Neuro: Non-focal Breast: Left mastectomy no nodularity, or local sign of recurrence.  Right breast is without masses ECOG PERFORMANCE STATUS: 1 - Symptomatic but completely ambulatory  LABORATORY DATA: Lab Results  Component Value Date   WBC 8.6 06/08/2013   HGB 10.8* 06/08/2013   HCT 33.0* 06/08/2013   MCV 87.3 06/08/2013   PLT 343 06/08/2013      Chemistry      Component Value Date/Time   NA 140 06/08/2013 1346   NA 139 02/22/2012 1148   K 4.2 06/08/2013 1346   K 4.0 02/22/2012 1148   CL 108* 12/28/2012 1343   CL 106 02/22/2012 1148   CO2 23 06/08/2013 1346   CO2 27 02/22/2012 1148   BUN 16.8 06/08/2013 1346   BUN 15 02/22/2012 1148   CREATININE 0.8 06/08/2013 1346   CREATININE 0.52 02/22/2012 1148      Component Value Date/Time   CALCIUM 9.8 06/08/2013 1346   CALCIUM 8.6 02/22/2012 1148   ALKPHOS 70 06/08/2013 1346   ALKPHOS 59 02/22/2012 1148   AST 17 06/08/2013 1346   AST 21 02/22/2012 1148   ALT 18 06/08/2013 1346   ALT 30 02/22/2012 1148   BILITOT 0.20 06/08/2013 1346   BILITOT 0.2* 02/22/2012 1148    ADDITIONAL INFORMATION: 2. PROGNOSTIC INDICATORS - ACIS Results IMMUNOHISTOCHEMICAL AND MORPHOMETRIC ANALYSIS BY THE AUTOMATED CELLULAR IMAGING SYSTEM (ACIS) THE SMALLER TUMOR SHOWS THE FOLLOWING BREAST PROGNOSTIC PROFILE: Estrogen Receptor (Negative, <1%): 100%,POSITIVE, STRONG STAINING INTENSITY Progesterone Receptor (Negative, <1%): 7%,POSITIVE, STRONG STAINING INTENSITY Proliferation Marker Ki67 by M IB-1 (Low<20%): 50% All controls stained appropriately Abigail Miyamoto MD Pathologist, Electronic Signature ( Signed 12/25/2011) 2. CHROMOGENIC IN-SITU HYBRIDIZATION Interpretation: 0.7 CM SMALLER TUMOR: HER2/NEU BY CISH - SHOWS AMPLIFICATION BY  CISH ANALYSIS. THE RATIO OF HER2: CEP 17 SIGNALS WAS 4.94. 1.7 CM LEFT SUPERIOR TUMOR: HER-2/NEU BY CISH - NO AMPLIFICATION OF HER-2 DETECTED. THE RATIO OF HER-2: CEP 17 SIGNALS WAS 1.14. Reference range: Ratio: HER2:CEP17 < 1.8 - gene amplification not observed Ratio: HER2:CEP 17 1.8-2.2 - equivocal result Ratio: HER2:CEP17 > 2.2 - gene amplification observed 1 of 4 FINAL for Heinbaugh, Aiana S [phone]) ADDITIONAL INFORMATION:(continued) Comment: The cancer center was notified on 12-24-2011. H. CATHERINE  LI MD Pathologist, Electronic Signature ( Signed 12/24/2011) FINAL DIAGNOSIS Diagnosis 1. Lymph node, sentinel, biopsy, Left - METASTATIC CARCINOMA IN 1 OF 1 LYMPH NODE WITH FOCAL CAPSULAR EXTENSION (1/1). 2. Breast, simple mastectomy, Left - INVASIVE DUCTAL CARCINOMA, TWO FOCI, WELL DIFFERENTIATED, SPANNING 1.7 AND 0.7 CM. - DUCTAL CARCINOMA IN SITU. - LYMPHOVASCULAR INVASION IS IDENTIFIED. - THE SURGICAL RESECTION MARGINS ARE NEGATIVE FOR CARCINOMA. - SEE ONCOLOGY TABLE BELOW. 3. Breast, biopsy, left, additional tissue - BENIGN SKIN AND SUBCUTANEOUS TISSUE. - THERE IS NO EVIDENCE OF MALIGNANCY. Microscopic Comment 2. BREAST, INVASIVE TUMOR, WITH LYMPH NODE SAMPLING Specimen, including laterality: Left breast. Procedure: Simple mastectomy Grade: Both are I Tubule formation: 1 and 1 Nuclear pleomorphism: 1 and 2 Mitotic:1 and 1 Tumor size (gross measurement): 1.7 and 0.7 cm Margins: Negative for carcinoma Invasive, distance to closest margin: 2.0 cm to the deep margin (gross measurement) In-situ, distance to closest margin: 2.0 cm from the deep margin (gross measurement). Lymphovascular invasion: Present. Ductal carcinoma in situ: Present. Grade: Intermediate grade. Extensive intraductal component: No. Lobular neoplasia: Not identified. Tumor focality: Two foci. Treatment effect: N/A Extent of tumor: Confined to breast parenchyma Lymph nodes: # examined: 1 Lymph  nodes with metastasis: 1 Isolated tumor cells (< 0.2 mm): 0 Micrometastasis: (> 0.2 mm and < 2.0 mm): 0 Macrometastasis: (> 2.0 mm): 1 Extracapsular extension: Present, focal. 2 of 4 FINAL for Gugliotta, Jaedynn S [phone]) Microscopic Comment(continued) Breast prognostic profile: [phone] Estrogen receptor: Positive (100%, strong staining intensity). Progesterone receptor: Positive (100%, strong staining intensity. Her 2 neu: No amplification was detected. The ratio was 1.45. Her 2 neu by CISH will be repeated on both tumors and the results reported separately. Ki-67: 13% Non-neoplastic breast: Healing biopsy site. TNM: mpT1c, pN1a Comments: Grossly, there are two foci of grade I invasive ductal carcinoma present in the simple mastectomy. The larger nodule spans 1.7 cm and is histologically identical to the previous core biopsy, [phone] ("left superior"). The second nodule, 0.7 cm, is grade I invasive ductal carcinoma with extracellular mucin. A complete profile will be performed on this second, smaller nodule. (JBK:gt, 12/22/11) Pecola Leisure MD Pathologist, Electronic Signature   RADIOGRAPHIC STUDIES:  No results found.  ASSESSMENT: 79 year old female with:  #1 new diagnosis of stage II invasive ductal carcinoma of the left breast she is status post mastectomy with finding of 2 foci of invasive ductal carcinoma both are ER/PR positive but the smaller focus measuring 0.7 cm is HER-2/neu positive with a ratio over 4. Patient was begun on adjuvant chemotherapy initially consisting of Taxol and Herceptin given weekly. However after 3 cycles of this patient could not tolerate it. And this was discontinued.  #2 she was then begun on Herceptin every 3 weeks at adjuvantly with radiation therapy. She completed radiation in December 2013.  #3 she is now receiving letrozole 2.5 mg daily since December 2013 overall she's been tolerating it well.  #4 she was then begun on Herceptin  every 3 weeks beginning on 04/11/12 - 02/2013.  #5 Anemia due to chemotherapy  #6 Osteopenia--Prolia  PLAN:  #1 proceed with prolia today and then in 6 months  #2 we will  Have the port removed by Dr. Dwain Sarna as soon as possible per patient request  #3 I will see her back in 6 months   She knows to call with any questions or concerns before her next appointment.    I spent 25 minutes counseling the patient face to face. The total time spent in the  appointment was 30 minutes.

## 2013-06-09 ENCOUNTER — Other Ambulatory Visit (INDEPENDENT_AMBULATORY_CARE_PROVIDER_SITE_OTHER): Payer: Self-pay | Admitting: General Surgery

## 2013-06-09 ENCOUNTER — Telehealth (INDEPENDENT_AMBULATORY_CARE_PROVIDER_SITE_OTHER): Payer: Self-pay

## 2013-06-09 LAB — VITAMIN D 25 HYDROXY (VIT D DEFICIENCY, FRACTURES): Vit D, 25-Hydroxy: 23 ng/mL — ABNORMAL LOW (ref 30–89)

## 2013-06-09 NOTE — Telephone Encounter (Signed)
Pt returned my call. The pt said ok to just get her scheduled for the port removal. I notified Dr Dwain Sarna.

## 2013-06-09 NOTE — Telephone Encounter (Signed)
Message copied by Ethlyn Gallery on Fri Jun 09, 2013 11:25 AM ------      Message from: [place], [place]      Created: Fri Jun 09, 2013  9:45 AM      Regarding: FW: port removal       Can you ask if she wants to come in first or just get scheduled.      ----- Message -----         From: Victorino December, MD         Sent: 06/08/2013   3:45 PM           To: Emelia Loron, MD      Subject: port removal                                             Matt       ms Severino can have her port out at your convenience            Thank you             KK       ------

## 2013-06-09 NOTE — Telephone Encounter (Signed)
LMOM for pt to call me. 

## 2013-06-12 ENCOUNTER — Other Ambulatory Visit: Payer: Self-pay | Admitting: Oncology

## 2013-06-13 ENCOUNTER — Encounter (HOSPITAL_BASED_OUTPATIENT_CLINIC_OR_DEPARTMENT_OTHER): Payer: Self-pay | Admitting: *Deleted

## 2013-06-13 NOTE — Progress Notes (Signed)
For pac out-dr Hodieron waved ekg-labs done 06/08/13 cancer center-echo 4/14 good

## 2013-06-15 ENCOUNTER — Encounter (HOSPITAL_BASED_OUTPATIENT_CLINIC_OR_DEPARTMENT_OTHER): Payer: Self-pay | Admitting: Anesthesiology

## 2013-06-15 ENCOUNTER — Ambulatory Visit (HOSPITAL_BASED_OUTPATIENT_CLINIC_OR_DEPARTMENT_OTHER): Payer: Medicare Other | Admitting: Anesthesiology

## 2013-06-15 ENCOUNTER — Ambulatory Visit (HOSPITAL_BASED_OUTPATIENT_CLINIC_OR_DEPARTMENT_OTHER)
Admission: RE | Admit: 2013-06-15 | Discharge: 2013-06-15 | Disposition: A | Discharge status: Home / Self Care | Payer: Medicare Other | Source: Ambulatory Visit | Attending: General Surgery | Admitting: General Surgery

## 2013-06-15 ENCOUNTER — Encounter (HOSPITAL_BASED_OUTPATIENT_CLINIC_OR_DEPARTMENT_OTHER)
Admission: RE | Disposition: A | Discharge status: Home / Self Care | Payer: Self-pay | Source: Ambulatory Visit | Attending: General Surgery

## 2013-06-15 ENCOUNTER — Encounter (HOSPITAL_BASED_OUTPATIENT_CLINIC_OR_DEPARTMENT_OTHER): Payer: Medicare Other | Admitting: Anesthesiology

## 2013-06-15 DIAGNOSIS — J45909 Unspecified asthma, uncomplicated: Secondary | ICD-10-CM | POA: Insufficient documentation

## 2013-06-15 DIAGNOSIS — R1310 Dysphagia, unspecified: Secondary | ICD-10-CM | POA: Insufficient documentation

## 2013-06-15 DIAGNOSIS — Z923 Personal history of irradiation: Secondary | ICD-10-CM | POA: Insufficient documentation

## 2013-06-15 DIAGNOSIS — Z452 Encounter for adjustment and management of vascular access device: Secondary | ICD-10-CM | POA: Insufficient documentation

## 2013-06-15 DIAGNOSIS — G4700 Insomnia, unspecified: Secondary | ICD-10-CM | POA: Insufficient documentation

## 2013-06-15 DIAGNOSIS — F329 Major depressive disorder, single episode, unspecified: Secondary | ICD-10-CM | POA: Insufficient documentation

## 2013-06-15 DIAGNOSIS — Z87442 Personal history of urinary calculi: Secondary | ICD-10-CM | POA: Insufficient documentation

## 2013-06-15 DIAGNOSIS — K219 Gastro-esophageal reflux disease without esophagitis: Secondary | ICD-10-CM | POA: Insufficient documentation

## 2013-06-15 DIAGNOSIS — F3289 Other specified depressive episodes: Secondary | ICD-10-CM | POA: Insufficient documentation

## 2013-06-15 DIAGNOSIS — Z853 Personal history of malignant neoplasm of breast: Secondary | ICD-10-CM | POA: Insufficient documentation

## 2013-06-15 DIAGNOSIS — Z9010 Acquired absence of unspecified breast and nipple: Secondary | ICD-10-CM | POA: Insufficient documentation

## 2013-06-15 DIAGNOSIS — E785 Hyperlipidemia, unspecified: Secondary | ICD-10-CM | POA: Insufficient documentation

## 2013-06-15 DIAGNOSIS — I10 Essential (primary) hypertension: Secondary | ICD-10-CM | POA: Insufficient documentation

## 2013-06-15 HISTORY — PX: PORT-A-CATH REMOVAL: SHX5289

## 2013-06-15 SURGERY — REMOVAL PORT-A-CATH
Anesthesia: Monitor Anesthesia Care | Site: Chest | Laterality: Right

## 2013-06-15 MED ORDER — OXYCODONE-ACETAMINOPHEN 10-325 MG PO TABS: 1.0000 | ORAL_TABLET | Freq: Four times a day (QID) | ORAL | Status: DC | PRN

## 2013-06-15 MED ORDER — LIDOCAINE HCL 1 % IJ SOLN
INTRAMUSCULAR | Status: DC | PRN
  Administered 2013-06-15: 15:00:00

## 2013-06-15 MED ORDER — LACTATED RINGERS IV SOLN
INTRAVENOUS | Status: DC
  Administered 2013-06-15: 14:00:00 via INTRAVENOUS

## 2013-06-15 MED ORDER — FENTANYL CITRATE 0.05 MG/ML IJ SOLN: 25.0000 ug | INTRAMUSCULAR | Status: DC | PRN

## 2013-06-15 MED ORDER — FENTANYL CITRATE 0.05 MG/ML IJ SOLN
INTRAMUSCULAR | Status: DC | PRN
  Administered 2013-06-15 (×2): 50 ug via INTRAVENOUS

## 2013-06-15 MED ORDER — METOCLOPRAMIDE HCL 5 MG/ML IJ SOLN: 10.0000 mg | Freq: Once | INTRAMUSCULAR | Status: DC | PRN

## 2013-06-15 MED ORDER — BUPIVACAINE HCL (PF) 0.25 % IJ SOLN
INTRAMUSCULAR | Status: AC
  Filled 2013-06-15: qty 30

## 2013-06-15 MED ORDER — OXYCODONE HCL 5 MG/5ML PO SOLN: 5.0000 mg | Freq: Once | ORAL | Status: DC | PRN

## 2013-06-15 MED ORDER — FENTANYL CITRATE 0.05 MG/ML IJ SOLN
INTRAMUSCULAR | Status: AC
  Filled 2013-06-15: qty 4

## 2013-06-15 MED ORDER — PROPOFOL INFUSION 10 MG/ML OPTIME
INTRAVENOUS | Status: DC | PRN
  Administered 2013-06-15: 100 ug/kg/min via INTRAVENOUS

## 2013-06-15 MED ORDER — LIDOCAINE-EPINEPHRINE (PF) 1 %-1:200000 IJ SOLN
INTRAMUSCULAR | Status: AC
  Filled 2013-06-15: qty 10

## 2013-06-15 MED ORDER — OXYCODONE HCL 5 MG PO TABS: 5.0000 mg | ORAL_TABLET | Freq: Once | ORAL | Status: DC | PRN

## 2013-06-15 MED ORDER — LIDOCAINE HCL (CARDIAC) 20 MG/ML IV SOLN
INTRAVENOUS | Status: DC | PRN
  Administered 2013-06-15: 50 mg via INTRAVENOUS

## 2013-06-15 SURGICAL SUPPLY — 25 items
BLADE SURG 15 STRL LF DISP TIS (BLADE) ×1 IMPLANT
BLADE SURG 15 STRL SS (BLADE) ×1
CHLORAPREP W/TINT 26ML (MISCELLANEOUS) ×2 IMPLANT
COVER MAYO STAND STRL (DRAPES) ×2 IMPLANT
COVER TABLE BACK 60X90 (DRAPES) ×2 IMPLANT
DECANTER SPIKE VIAL GLASS SM (MISCELLANEOUS) ×2 IMPLANT
DERMABOND ADVANCED (GAUZE/BANDAGES/DRESSINGS) ×1
DERMABOND ADVANCED .7 DNX12 (GAUZE/BANDAGES/DRESSINGS) ×1 IMPLANT
DRAPE PED LAPAROTOMY (DRAPES) ×2 IMPLANT
ELECT COATED BLADE 2.86 ST (ELECTRODE) ×2 IMPLANT
ELECT REM PT RETURN 9FT ADLT (ELECTROSURGICAL) ×2
ELECTRODE REM PT RTRN 9FT ADLT (ELECTROSURGICAL) ×1 IMPLANT
GLOVE BIO SURGEON STRL SZ 6.5 (GLOVE) ×2 IMPLANT
GLOVE BIO SURGEON STRL SZ7 (GLOVE) ×2 IMPLANT
GOWN PREVENTION PLUS XLARGE (GOWN DISPOSABLE) ×4 IMPLANT
NEEDLE HYPO 25X1 1.5 SAFETY (NEEDLE) ×2 IMPLANT
PACK BASIN DAY SURGERY FS (CUSTOM PROCEDURE TRAY) ×2 IMPLANT
PENCIL BUTTON HOLSTER BLD 10FT (ELECTRODE) ×2 IMPLANT
SLEEVE SCD COMPRESS KNEE MED (MISCELLANEOUS) IMPLANT
SUT MON AB 4-0 PC3 18 (SUTURE) ×2 IMPLANT
SUT VIC AB 3-0 SH 27 (SUTURE) ×1
SUT VIC AB 3-0 SH 27X BRD (SUTURE) ×1 IMPLANT
SYR CONTROL 10ML LL (SYRINGE) ×2 IMPLANT
TOWEL OR 17X24 6PK STRL BLUE (TOWEL DISPOSABLE) ×2 IMPLANT
TOWEL OR NON WOVEN STRL DISP B (DISPOSABLE) ×2 IMPLANT

## 2013-06-15 NOTE — H&P (Signed)
Brandy [name] is an 79 y.o. female.   Chief Complaint: no longer needs venous access s/p breast cancer treatment HPI: 6 yof well known to me from breast cancer treatment who has completed adjuvant therapy and no longer needs venous access.  She reports no complaints.  Past Medical History  Diagnosis Date  . Asthma   . Hyperlipidemia     takes Simvastatin daily  . Hypertension     takes Amlodipine and Diovan daily  . Cancer     left breast  . Bronchitis     hx of;last time >58yr ago  . Arthritis     shoulders  . Dysphagia   . H/O hiatal hernia   . GERD (gastroesophageal reflux disease)     takes Prilosec prn  . Urinary urgency   . History of kidney stones     many yrs ago  . Depression     takes Celexa daily  . Insomnia     takes Ambien nightly  . Breast cancer 12/18/11    left breast masectomy=metastatic ca in (1/1) lymph node ,invasive ductal ca,2 foci,,dcis,lymph ovascular invasion identified,surgical resection margins neg for ca,additional tissue=benign skin and subcutaneous tissue  . Breast CA 11/23/11    left breast 3 o'clock bx=high grade ductal ca in situ w/comedononecrosis and calcification,dcis,lymphovascular invasion present ,ER?PR+positive  . Full dentures   . Hx of radiation therapy 04/26/12 -06/10/12    left breast    Past Surgical History  Procedure Laterality Date  . Breast biopsy  1998    left  . Total shoulder replacement  2011    left  . Eus  06/04/2011    Procedure: UPPER ENDOSCOPIC ULTRASOUND (EUS) LINEAR;  Surgeon: Rob Bunting, MD;  Location: [facility];  Service: Endoscopy;  Laterality: N/A;  . Exploratory laparotomy       biopsy of intra-abdominal mass  . Bladder tack    . Tubal ligation    . Appendectomy    . Dilation and curettage of uterus    . Esophagogastroduodenoscopy    . Mastectomy w/ sentinel node biopsy  12/18/2011    Procedure: MASTECTOMY WITH SENTINEL LYMPH NODE BIOPSY;  Surgeon: Emelia Loron, MD;  [facility];   Service: General;  Laterality: Left;  . Portacath placement  01/27/2012    Procedure: INSERTION PORT-A-CATH;  Surgeon: Emelia Loron, MD;  Location: [facility];  Service: General;  Laterality: Right;  PORT PLACEMENT    Family History  Problem Relation Age of Onset  . Cancer Father     prostate  . Cancer Other     breast ca   Social History:  reports that she has never smoked. She has never used smokeless tobacco. She reports that she does not drink alcohol or use illicit drugs.  Allergies: No Known Allergies  Medications Prior to Admission  Medication Sig Dispense Refill  . amLODipine (NORVASC) 10 MG tablet Take 10 mg by mouth daily.       . Calcium Carbonate-Vitamin D (CALCIUM + D PO) Take by mouth daily.      . citalopram (CELEXA) 20 MG tablet Take 20 mg by mouth daily.      Marland Kitchen DIOVAN HCT 160-12.5 MG per tablet Take 2 tablets by mouth every morning.       . ergocalciferol (VITAMIN D2) 50000 UNITS capsule Take 1 capsule (50,000 Units total) by mouth once a week.  4 capsule  0  . gabapentin (NEURONTIN) 100 MG capsule TAKE 3 CAPSULES AT BEDTIME  90 capsule  3  . letrozole (FEMARA) 2.5 MG tablet Take 1 tablet (2.5 mg total) by mouth daily.  90 tablet  12  . omeprazole (PRILOSEC) 20 MG capsule Take 20 mg by mouth daily.      Marland Kitchen oxyCODONE (OXY IR/ROXICODONE) 5 MG immediate release tablet Take 1 tablet (5 mg total) by mouth every 4 (four) hours as needed.  90 tablet  0  . simvastatin (ZOCOR) 20 MG tablet Take 20 mg by mouth daily.       Marland Kitchen spironolactone (ALDACTONE) 25 MG tablet Take 0.5 tablets (12.5 mg total) by mouth daily.  30 tablet  6  . zolpidem (AMBIEN) 10 MG tablet Take 10 mg by mouth at bedtime.        Results for orders placed during the hospital encounter of 06/15/13 (from the past 48 hour(s))  POCT HEMOGLOBIN-HEMACUE     Status: Abnormal   Collection Time    06/15/13  1:58 PM      Result Value Range   Hemoglobin 10.6 (*) 12.0 - 15.0 g/dL   No results  found.  ROS  Blood pressure 139/60, pulse 66, temperature 98.1 F (36.7 C), temperature source Oral, resp. rate 18, height 4\' 11"  (1.499 m), weight 168 lb 8 oz (76.431 kg), SpO2 96.00%. Physical Exam  Constitutional: She appears well-developed and well-nourished.  Cardiovascular: Normal rate and regular rhythm.   Respiratory: Breath sounds normal.       Assessment/Plan Port removal  Risks/benefits discussed  Daila Elbert 06/15/2013, 2:55 PM

## 2013-06-15 NOTE — Anesthesia Postprocedure Evaluation (Signed)
Anesthesia Post Note  Patient: Brandy [name]  Procedure(s) Performed: Procedure(s) (LRB): REMOVAL PORT-A-CATH (Right)  Anesthesia type: General  Patient location: PACU  Post pain: Pain level controlled  Post assessment: Patient's Cardiovascular Status Stable  Last Vitals:  Filed Vitals:   06/15/13 1545  BP: 119/41  Pulse: 67  Temp:   Resp: 17    Post vital signs: Reviewed and stable  Level of consciousness: alert  Complications: No apparent anesthesia complications

## 2013-06-15 NOTE — Anesthesia Preprocedure Evaluation (Signed)
Anesthesia Evaluation  Patient identified by MRN, date of birth, ID band Patient awake    Reviewed: Allergy & Precautions, H&P , NPO status , Patient's Chart, lab work & pertinent test results, reviewed documented beta blocker date and time   Airway Mallampati: II TM Distance: >3 FB Neck ROM: full    Dental   Pulmonary asthma ,  breath sounds clear to auscultation        Cardiovascular hypertension, On Medications + Valvular Problems/Murmurs AS Rhythm:regular     Neuro/Psych PSYCHIATRIC DISORDERS negative neurological ROS     GI/Hepatic Neg liver ROS, hiatal hernia, GERD-  ,  Endo/Other  negative endocrine ROS  Renal/GU negative Renal ROS  negative genitourinary   Musculoskeletal   Abdominal   Peds  Hematology negative hematology ROS (+)   Anesthesia Other Findings See surgeon's H&P   Reproductive/Obstetrics negative OB ROS                           Anesthesia Physical Anesthesia Plan  ASA: II  Anesthesia Plan: MAC   Post-op Pain Management:    Induction: Intravenous  Airway Management Planned: Simple Face Mask  Additional Equipment:   Intra-op Plan:   Post-operative Plan:   Informed Consent: I have reviewed the patients History and Physical, chart, labs and discussed the procedure including the risks, benefits and alternatives for the proposed anesthesia with the patient or authorized representative who has indicated his/her understanding and acceptance.   Dental Advisory Given  Plan Discussed with: CRNA and Surgeon  Anesthesia Plan Comments:         Anesthesia Quick Evaluation

## 2013-06-15 NOTE — Transfer of Care (Signed)
Immediate Anesthesia Transfer of Care Note  Patient: Brandy [name]  Procedure(s) Performed: Procedure(s): REMOVAL PORT-A-CATH (Right)  Patient Location: PACU  Anesthesia Type:MAC  Level of Consciousness: awake and alert   Airway & Oxygen Therapy: Patient Spontanous Breathing and Patient connected to face mask oxygen  Post-op Assessment: Report given to PACU RN and Post -op Vital signs reviewed and stable  Post vital signs: Reviewed and stable  Complications: No apparent anesthesia complications

## 2013-06-15 NOTE — Op Note (Signed)
Preoperative diagnosis: Breast cancer, no longer needs venous access Postoperative diagnosis: Same as above Procedure: Right subclavian port removal Surgeon: Dr. Harden Mo Anesthesia: Local with monitored anesthesia care Estimated blood loss: Minimal Specimens: None Drains: None Complications: None Sponge and needle count correct at completion Disposition to recovery stable  Indications: This is a 79 year old female who I know well from her treatment for breast cancer. She no longer needs her venous access we discussed removing her port.  Procedure: After informed consent was obtained the patient was taken to the operating room. She had sequential compression devices on her legs. She was placed under monitored anesthesia care. Her right chest was prepped and draped in the standard sterile surgical fashion. A surgical timeout was then performed.  I infiltrated Marcaine and lidocaine throughout the region of her port. I made an incision at the site of her insertion site. I then removed the port as well and the line in its entirety. I then sutured the tract closed with 3-0 Vicryl. Hemostasis was observed. I then closed this with 3-0 Vicryl, 4-0 Monocryl, and Dermabond. She tolerated this well and was transferred to recovery stable.

## 2013-06-16 ENCOUNTER — Encounter (HOSPITAL_BASED_OUTPATIENT_CLINIC_OR_DEPARTMENT_OTHER): Payer: Self-pay | Admitting: General Surgery

## 2013-06-26 ENCOUNTER — Telehealth: Payer: Self-pay | Admitting: *Deleted

## 2013-06-26 MED ORDER — ERGOCALCIFEROL 1.25 MG (50000 UT) PO CAPS: 50000.0000 [IU] | ORAL_CAPSULE | ORAL | Status: DC

## 2013-06-26 NOTE — Telephone Encounter (Signed)
Per NP request. Sent Rx for Vit D2 to pt's pharmacy. Instructed patient to take 1 tablet 50000 U by mouth once a week. Pt verbalized understanding. Message forwarded to Larna Daughters, NP.

## 2013-07-06 DIAGNOSIS — K8020 Calculus of gallbladder without cholecystitis without obstruction: Secondary | ICD-10-CM

## 2013-07-06 HISTORY — DX: Calculus of gallbladder without cholecystitis without obstruction: K80.20

## 2013-07-10 ENCOUNTER — Encounter (INDEPENDENT_AMBULATORY_CARE_PROVIDER_SITE_OTHER): Payer: Medicare Other | Admitting: General Surgery

## 2013-07-14 ENCOUNTER — Other Ambulatory Visit: Payer: Self-pay | Admitting: Adult Health

## 2013-07-25 ENCOUNTER — Ambulatory Visit (INDEPENDENT_AMBULATORY_CARE_PROVIDER_SITE_OTHER): Payer: Medicare HMO | Admitting: General Surgery

## 2013-07-25 ENCOUNTER — Encounter (INDEPENDENT_AMBULATORY_CARE_PROVIDER_SITE_OTHER): Payer: Self-pay | Admitting: General Surgery

## 2013-07-25 VITALS — BP 126/72 | HR 80 | Temp 98.0°F | Resp 18 | Ht 60.0 in | Wt 158.0 lb

## 2013-07-25 DIAGNOSIS — Z09 Encounter for follow-up examination after completed treatment for conditions other than malignant neoplasm: Secondary | ICD-10-CM

## 2013-07-25 NOTE — Progress Notes (Signed)
Subjective:     Patient ID: Brandy [name], female   DOB: [birth year], 80 y.o.   MRN: [id number]  HPI 76 yof s/p port removal who returns today doing well from that.  She has no complaints from her surgery.  She has since October 2 or 3 weeks had some emesis. This is associated with some pain after that. She is not he is much but her weight really not changed. She is having bowel movements and is passing flatus.  Review of Systems     Objective:   Physical Exam Right chest incision healing well without infection Abdomen soft nontender nondistended with no evidence of hernia, well-healed midline incision    Assessment:     S/p port removal     Plan:     She's doing well from her port removal. I will plan to see her towards the end of the year for breast cancer followup. I did discuss her GI symptoms. I think we are going to just follow those for now as that is what she would like. I discussed due to her prior surgery as well as her prior disease it would be reasonable to send her get a CT scan but she declined this at this point. She is going to call me if this gets worse.

## 2013-09-02 ENCOUNTER — Other Ambulatory Visit: Payer: Self-pay | Admitting: Oncology

## 2013-10-04 ENCOUNTER — Telehealth: Payer: Self-pay

## 2013-10-04 NOTE — Telephone Encounter (Signed)
Per Pt PCP Sabra Heck  - wants to put her on "bone pill".  Advised pt she is taking Prolia 60 mg every 6 months, last dose Jun 08, 2013.  She asked if it would hurt to take the pills also and I advised her to consult with her PCP.  Pt voiced understanding.

## 2013-11-21 ENCOUNTER — Telehealth: Payer: Self-pay | Admitting: Physician Assistant

## 2013-11-21 NOTE — Telephone Encounter (Signed)
, °

## 2013-12-07 ENCOUNTER — Telehealth: Payer: Self-pay | Admitting: Oncology

## 2013-12-07 ENCOUNTER — Ambulatory Visit (HOSPITAL_BASED_OUTPATIENT_CLINIC_OR_DEPARTMENT_OTHER): Payer: Medicare HMO

## 2013-12-07 ENCOUNTER — Ambulatory Visit (HOSPITAL_BASED_OUTPATIENT_CLINIC_OR_DEPARTMENT_OTHER): Payer: Medicare HMO | Admitting: Physician Assistant

## 2013-12-07 ENCOUNTER — Ambulatory Visit: Payer: Medicare Other

## 2013-12-07 ENCOUNTER — Ambulatory Visit: Payer: Medicare Other | Admitting: Oncology

## 2013-12-07 ENCOUNTER — Encounter: Payer: Self-pay | Admitting: Physician Assistant

## 2013-12-07 ENCOUNTER — Other Ambulatory Visit (HOSPITAL_BASED_OUTPATIENT_CLINIC_OR_DEPARTMENT_OTHER): Payer: Medicare HMO

## 2013-12-07 ENCOUNTER — Other Ambulatory Visit: Payer: Medicare Other

## 2013-12-07 VITALS — BP 140/58 | HR 103 | Temp 97.5°F | Resp 18 | Ht 60.0 in | Wt 159.0 lb

## 2013-12-07 DIAGNOSIS — M949 Disorder of cartilage, unspecified: Secondary | ICD-10-CM

## 2013-12-07 DIAGNOSIS — T451X5A Adverse effect of antineoplastic and immunosuppressive drugs, initial encounter: Secondary | ICD-10-CM

## 2013-12-07 DIAGNOSIS — C50919 Malignant neoplasm of unspecified site of unspecified female breast: Secondary | ICD-10-CM

## 2013-12-07 DIAGNOSIS — D6481 Anemia due to antineoplastic chemotherapy: Secondary | ICD-10-CM

## 2013-12-07 DIAGNOSIS — C50412 Malignant neoplasm of upper-outer quadrant of left female breast: Secondary | ICD-10-CM

## 2013-12-07 DIAGNOSIS — Z170 Estrogen receptor positive status [ER+]: Secondary | ICD-10-CM

## 2013-12-07 DIAGNOSIS — M8580 Other specified disorders of bone density and structure, unspecified site: Secondary | ICD-10-CM

## 2013-12-07 DIAGNOSIS — D649 Anemia, unspecified: Secondary | ICD-10-CM

## 2013-12-07 DIAGNOSIS — Z79899 Other long term (current) drug therapy: Secondary | ICD-10-CM

## 2013-12-07 DIAGNOSIS — M899 Disorder of bone, unspecified: Secondary | ICD-10-CM

## 2013-12-07 DIAGNOSIS — C50419 Malignant neoplasm of upper-outer quadrant of unspecified female breast: Secondary | ICD-10-CM

## 2013-12-07 LAB — CBC WITH DIFFERENTIAL/PLATELET
BASO%: 0.4 % (ref 0.0–2.0)
BASOS ABS: 0 10*3/uL (ref 0.0–0.1)
EOS%: 3.3 % (ref 0.0–7.0)
Eosinophils Absolute: 0.3 10*3/uL (ref 0.0–0.5)
HEMATOCRIT: 35.5 % (ref 34.8–46.6)
HGB: 11.7 g/dL (ref 11.6–15.9)
LYMPH%: 23 % (ref 14.0–49.7)
MCH: 28.6 pg (ref 25.1–34.0)
MCHC: 33.1 g/dL (ref 31.5–36.0)
MCV: 86.4 fL (ref 79.5–101.0)
MONO#: 0.5 10*3/uL (ref 0.1–0.9)
MONO%: 4.8 % (ref 0.0–14.0)
NEUT#: 6.7 10*3/uL — ABNORMAL HIGH (ref 1.5–6.5)
NEUT%: 68.5 % (ref 38.4–76.8)
Platelets: 269 10*3/uL (ref 145–400)
RBC: 4.11 10*6/uL (ref 3.70–5.45)
RDW: 14.1 % (ref 11.2–14.5)
WBC: 9.8 10*3/uL (ref 3.9–10.3)
lymph#: 2.3 10*3/uL (ref 0.9–3.3)

## 2013-12-07 LAB — COMPREHENSIVE METABOLIC PANEL (CC13)
ALT: 20 U/L (ref 0–55)
AST: 16 U/L (ref 5–34)
Albumin: 3.3 g/dL — ABNORMAL LOW (ref 3.5–5.0)
Alkaline Phosphatase: 80 U/L (ref 40–150)
Anion Gap: 14 mEq/L — ABNORMAL HIGH (ref 3–11)
BUN: 19.1 mg/dL (ref 7.0–26.0)
CALCIUM: 9.3 mg/dL (ref 8.4–10.4)
CHLORIDE: 106 meq/L (ref 98–109)
CO2: 19 mEq/L — ABNORMAL LOW (ref 22–29)
CREATININE: 0.8 mg/dL (ref 0.6–1.1)
Glucose: 161 mg/dl — ABNORMAL HIGH (ref 70–140)
Potassium: 4 mEq/L (ref 3.5–5.1)
Sodium: 139 mEq/L (ref 136–145)
Total Bilirubin: 0.36 mg/dL (ref 0.20–1.20)
Total Protein: 7 g/dL (ref 6.4–8.3)

## 2013-12-07 MED ORDER — DENOSUMAB 60 MG/ML ~~LOC~~ SOLN
60.0000 mg | Freq: Once | SUBCUTANEOUS | Status: AC
  Administered 2013-12-07: 60 mg via SUBCUTANEOUS
  Filled 2013-12-07: qty 1

## 2013-12-07 NOTE — Telephone Encounter (Signed)
gv and printed appt sched appt sched and avs for pt for DEC...pt sched at [facility] for 7.6 @ 2p, for mammo and bone density

## 2013-12-07 NOTE — Progress Notes (Signed)
OFFICE PROGRESS NOTE  CC  Tawanna Solo, MD [place] [id number] Dr. Rolm Bookbinder Dr. Gery Pray  DIAGNOSIS:  80 year old female with new diagnosis of multifocal breast cancer of the left breast. Patient is status post mastectomy with sentinel lymph node biopsy performed on 12/18/2011.   Stage:  Left breast:  T1 N1 MX Invasive ductal carcinoma, multifocal ER/PR/HER-2/neu positive  PRIOR THERAPY: #1 patient originally presented with an suspicious abdominal mass. However multiple endoscopic biopsies revealed no malignancy.   #2 in May 2013 patient was diagnosed with multifocal breast cancer of the left breast. She underwent a mammogram after she felt a mass in the left breast. The mammogram showed an abnormality.biopsy was performed and she was found to have an ace invasive ductal carcinoma. She underwent a mastectomy on 12/18/2011. The final pathology revealed 2 foci of well-differentiated invasive ductal carcinoma one measuring 1.7 cm and the second measuring 0.7 cm there was ductal carcinoma in situ lymphovascular invasion was also identified all surgical margins were negative for carcinoma. Patient went on to also have a left sentinel node biopsy one sentinel node was positive for metastatic disease with focal capsular extension. The tumor was estrogen receptor positive progesterone receptor positive. The largest tumor measuring 1.7 cm was HER-2/neu negative with a Ki-67 of 13%. The smaller tumor showed ER +100% PR receptor 7% proliferation marker 50% and it was HER-2/neu positive with a ratio 4.94.  #5 status post combination chemotherapy consisting of Taxol and Herceptin starting on 02/09/2012 - 04/04/2012. This was discontinued early due to to significant toxicity from the Taxol.  #6 status post adjuvant Herceptin every 3 weeks starting 04/11/2012 through 02/08/2013.  #6 status post Radiation therapy from 04/26/12 through 06/10/12.    #7 She was  started on adjuvant Letrozole in 06/2012. A total of 5 years of therapy is planned.    #8 bone density performed in April 2014 revealed osteopenia. Recommended prolia every 6 months. Risks benefits side effects discussed with the patient  CURRENT THERAPY: letrozole 2.5 mg daily  INTERVAL HISTORY: Brandy [name] 80 y.o. female returns for followup visit today. Overall patient is doing well. She is still tired still has some residual neuropathy. She reports that she was in the hospital either late March or early April and had surgery on her right wrist wrist to have a knot removed. They also tried to repair her middle finger but according the patient apparently were not able to do so. She denies any fevers chills night sweats. She denied any night sweats or significant weight loss. She had no change in her bowel or bladder habits. She voiced no specific complaints today.   MEDICAL HISTORY: Past Medical History  Diagnosis Date  . Asthma   . Hyperlipidemia     takes Simvastatin daily  . Hypertension     takes Amlodipine and Diovan daily  . Cancer     left breast  . Bronchitis     hx of;last time >46yrago  . Arthritis     shoulders  . Dysphagia   . H/O hiatal hernia   . GERD (gastroesophageal reflux disease)     takes Prilosec prn  . Urinary urgency   . History of kidney stones     many yrs ago  . Depression     takes Celexa daily  . Insomnia     takes Ambien nightly  . Breast cancer 12/18/11    left breast masectomy=metastatic ca in (1/1) lymph node ,invasive  ductal ca,2 foci,,dcis,lymph ovascular invasion identified,surgical resection margins neg for ca,additional tissue=benign skin and subcutaneous tissue  . Breast CA 11/23/11    left breast 3 o'clock bx=high grade ductal ca in situ w/comedononecrosis and calcification,dcis,lymphovascular invasion present ,ER?PR+positive  . Full dentures   . Hx of radiation therapy 04/26/12 -06/10/12    left breast    ALLERGIES:  has No Known  Allergies.  MEDICATIONS:  Current Outpatient Prescriptions  Medication Sig Dispense Refill  . amLODipine (NORVASC) 10 MG tablet Take 10 mg by mouth daily.       . citalopram (CELEXA) 20 MG tablet Take 20 mg by mouth daily.      Marland Kitchen DIOVAN HCT 160-12.5 MG per tablet Take 2 tablets by mouth every morning.       . gabapentin (NEURONTIN) 100 MG capsule TAKE 3 CAPSULES AT BEDTIME  90 capsule  3  . letrozole (FEMARA) 2.5 MG tablet TAKE 1 TABLET (2.5 MG TOTAL) BY MOUTH DAILY.  90 tablet  3  . omeprazole (PRILOSEC) 20 MG capsule Take 20 mg by mouth daily.      . simvastatin (ZOCOR) 20 MG tablet Take 20 mg by mouth daily.       Marland Kitchen spironolactone (ALDACTONE) 25 MG tablet Take 0.5 tablets (12.5 mg total) by mouth daily.  30 tablet  6  . zolpidem (AMBIEN) 10 MG tablet Take 10 mg by mouth at bedtime.      . Calcium Carbonate-Vitamin D (CALCIUM + D PO) Take by mouth daily.      . ergocalciferol (VITAMIN D2) 50000 UNITS capsule Take 1 capsule (50,000 Units total) by mouth once a week.  4 capsule  0  . oxyCODONE (OXY IR/ROXICODONE) 5 MG immediate release tablet Take 1 tablet (5 mg total) by mouth every 4 (four) hours as needed.  90 tablet  0  . oxyCODONE-acetaminophen (PERCOCET) 10-325 MG per tablet Take 1 tablet by mouth every 6 (six) hours as needed for pain.  20 tablet  0   No current facility-administered medications for this visit.   Facility-Administered Medications Ordered in Other Visits  Medication Dose Route Frequency Provider Last Rate Last Dose  . sodium chloride 0.9 % injection 10 mL  10 mL Intracatheter PRN Deatra Robinson, MD   10 mL at 03/14/12 1628    SURGICAL HISTORY:  Past Surgical History  Procedure Laterality Date  . Breast biopsy  1998    left  . Total shoulder replacement  2011    left  . Eus  06/04/2011    Procedure: UPPER ENDOSCOPIC ULTRASOUND (EUS) LINEAR;  Surgeon: Owens Loffler, MD;  Location: [facility];  Service: Endoscopy;  Laterality: N/A;  . Exploratory laparotomy        biopsy of intra-abdominal mass  . Bladder tack    . Tubal ligation    . Appendectomy    . Dilation and curettage of uterus    . Esophagogastroduodenoscopy    . Mastectomy w/ sentinel node biopsy  12/18/2011    Procedure: MASTECTOMY WITH SENTINEL LYMPH NODE BIOPSY;  Surgeon: Rolm Bookbinder, MD;  Location: [place];  Service: General;  Laterality: Left;  . Portacath placement  01/27/2012    Procedure: INSERTION PORT-A-CATH;  Surgeon: Rolm Bookbinder, MD;  Location: [place];  Service: General;  Laterality: Right;  PORT PLACEMENT  . Port-a-cath removal Right 06/15/2013    Procedure: REMOVAL PORT-A-CATH;  Surgeon: Rolm Bookbinder, MD;  Location: [place];  Service: General;  Laterality: Right;  REVIEW OF SYSTEMS:   General: fatigue (-), night sweats (-), fever (-), pain (-) Lymph: palpable nodes (-) HEENT: vision changes (-), mucositis (-), gum bleeding (-), epistaxis (-) Cardiovascular: chest pain (-), palpitations (-) Pulmonary: shortness of breath (-), dyspnea on exertion (-), cough (-), hemoptysis (-) GI:  Early satiety (-), melena (-), dysphagia (-), nausea/vomiting (-), diarrhea (-) GU: dysuria (-), hematuria (-), incontinence (-) Musculoskeletal: joint swelling (-), joint pain (+), back pain (-) Neuro: weakness (-), numbness (-), headache (-), confusion (-) Skin: Rash (-), lesions (-), dryness (-) Psych: depression (-), suicidal/homicidal ideation (-), feeling of hopelessness (-)  Health Maintenance  Mammogram: 12/2012 Colonoscopy: Never Bone Density Scan: 10/28/12 Pap Smear: Years Eye Exam: 2 years ago Vitamin D Level: 1/14 Lipid Panel: 2011   PHYSICAL EXAMINATION:  BP 140/58  Pulse 103  Temp(Src) 97.5 F (36.4 C) (Oral)  Resp 18  Ht 5' (1.524 m)  Wt 159 lb (72.122 kg)  BMI 31.05 kg/m2  SpO2 99%  General: Patient is an elderly appearing female in no acute distress HEENT: PERRLA, sclerae anicteric no conjunctival pallor,  MMM Neck: supple, no palpable adenopathy Lungs: clear to auscultation bilaterally, no wheezes, rhonchi, or rales Cardiovascular: regular rate rhythm, S1, S2, systolic murmur, rubs or gallops Abdomen: Soft, non-tender, non-distended, normoactive bowel sounds, no HSM Extremities: warm and well perfused, no clubbing, cyanosis, or edema Skin: No rash or lesions, previous shingles and superinfection have healed. Neuro: Non-focal Breast: Left mastectomy no nodularity, or local sign of recurrence.  Right breast is without masses, skin changes or nipple discharge  ECOG PERFORMANCE STATUS: 1 - Symptomatic but completely ambulatory  LABORATORY DATA: Lab Results  Component Value Date   WBC 9.8 12/07/2013   HGB 11.7 12/07/2013   HCT 35.5 12/07/2013   MCV 86.4 12/07/2013   PLT 269 12/07/2013      Chemistry      Component Value Date/Time   NA 139 12/07/2013 1434   NA 139 02/22/2012 1148   K 4.0 12/07/2013 1434   K 4.0 02/22/2012 1148   CL 108* 12/28/2012 1343   CL 106 02/22/2012 1148   CO2 19* 12/07/2013 1434   CO2 27 02/22/2012 1148   BUN 19.1 12/07/2013 1434   BUN 15 02/22/2012 1148   CREATININE 0.8 12/07/2013 1434   CREATININE 0.52 02/22/2012 1148      Component Value Date/Time   CALCIUM 9.3 12/07/2013 1434   CALCIUM 8.6 02/22/2012 1148   ALKPHOS 80 12/07/2013 1434   ALKPHOS 59 02/22/2012 1148   AST 16 12/07/2013 1434   AST 21 02/22/2012 1148   ALT 20 12/07/2013 1434   ALT 30 02/22/2012 1148   BILITOT 0.36 12/07/2013 1434   BILITOT 0.2* 02/22/2012 1148    ADDITIONAL INFORMATION: 2. PROGNOSTIC INDICATORS - ACIS Results IMMUNOHISTOCHEMICAL AND MORPHOMETRIC ANALYSIS BY THE AUTOMATED CELLULAR IMAGING SYSTEM (ACIS) THE SMALLER TUMOR SHOWS THE FOLLOWING BREAST PROGNOSTIC PROFILE: Estrogen Receptor (Negative, <1%): 100%,POSITIVE, STRONG STAINING INTENSITY Progesterone Receptor (Negative, <1%): 7%,POSITIVE, STRONG STAINING INTENSITY Proliferation Marker Ki67 by M IB-1 (Low<20%): 50% All controls stained  appropriately Aldona Bar MD Pathologist, Electronic Signature ( Signed 12/25/2011) 2. CHROMOGENIC IN-SITU HYBRIDIZATION Interpretation: 0.7 CM SMALLER TUMOR: HER2/NEU BY CISH - SHOWS AMPLIFICATION BY CISH ANALYSIS. THE RATIO OF HER2: CEP 17 SIGNALS WAS 4.94. 1.7 CM LEFT SUPERIOR TUMOR: HER-2/NEU BY CISH - NO AMPLIFICATION OF HER-2 DETECTED. THE RATIO OF HER-2: CEP 17 SIGNALS WAS 1.14. Reference range: Ratio: HER2:CEP17 < 1.8 - gene amplification not observed Ratio:  HER2:CEP 17 1.8-2.2 - equivocal result Ratio: HER2:CEP17 > 2.2 - gene amplification observed 1 of 4 FINAL for Cohron, Kessler S [phone]) ADDITIONAL INFORMATION:(continued) Comment: The cancer center was notified on 12-24-2011. Aldona Bar MD Pathologist, Electronic Signature ( Signed 12/24/2011) FINAL DIAGNOSIS Diagnosis 1. Lymph node, sentinel, biopsy, Left - METASTATIC CARCINOMA IN 1 OF 1 LYMPH NODE WITH FOCAL CAPSULAR EXTENSION (1/1). 2. Breast, simple mastectomy, Left - INVASIVE DUCTAL CARCINOMA, TWO FOCI, WELL DIFFERENTIATED, SPANNING 1.7 AND 0.7 CM. - DUCTAL CARCINOMA IN SITU. - LYMPHOVASCULAR INVASION IS IDENTIFIED. - THE SURGICAL RESECTION MARGINS ARE NEGATIVE FOR CARCINOMA. - SEE ONCOLOGY TABLE BELOW. 3. Breast, biopsy, left, additional tissue - BENIGN SKIN AND SUBCUTANEOUS TISSUE. - THERE IS NO EVIDENCE OF MALIGNANCY. Microscopic Comment 2. BREAST, INVASIVE TUMOR, WITH LYMPH NODE SAMPLING Specimen, including laterality: Left breast. Procedure: Simple mastectomy Grade: Both are I Tubule formation: 1 and 1 Nuclear pleomorphism: 1 and 2 Mitotic:1 and 1 Tumor size (gross measurement): 1.7 and 0.7 cm Margins: Negative for carcinoma Invasive, distance to closest margin: 2.0 cm to the deep margin (gross measurement) In-situ, distance to closest margin: 2.0 cm from the deep margin (gross measurement). Lymphovascular invasion: Present. Ductal carcinoma in situ: Present. Grade: Intermediate  grade. Extensive intraductal component: No. Lobular neoplasia: Not identified. Tumor focality: Two foci. Treatment effect: N/A Extent of tumor: Confined to breast parenchyma Lymph nodes: # examined: 1 Lymph nodes with metastasis: 1 Isolated tumor cells (< 0.2 mm): 0 Micrometastasis: (> 0.2 mm and < 2.0 mm): 0 Macrometastasis: (> 2.0 mm): 1 Extracapsular extension: Present, focal. 2 of 4 FINAL for Kohen, Shakhia S [phone]) Microscopic Comment(continued) Breast prognostic profile: [phone] Estrogen receptor: Positive (100%, strong staining intensity). Progesterone receptor: Positive (100%, strong staining intensity. Her 2 neu: No amplification was detected. The ratio was 1.45. Her 2 neu by CISH will be repeated on both tumors and the results reported separately. Ki-67: 13% Non-neoplastic breast: Healing biopsy site. TNM: mpT1c, pN1a Comments: Grossly, there are two foci of grade I invasive ductal carcinoma present in the simple mastectomy. The larger nodule spans 1.7 cm and is histologically identical to the previous core biopsy, [id number] ("left superior"). The second nodule, 0.7 cm, is grade I invasive ductal carcinoma with extracellular mucin. A complete profile will be performed on this second, smaller nodule. (JBK:gt, 12/22/11) Enid Cutter MD Pathologist, Electronic Signature   RADIOGRAPHIC STUDIES: Bone density performed 10/19/2012 revealed a T score of -2.3 right mammogram on 12/15/2012 revealed no new or worrisome lesions.   ASSESSMENT: 80 year old female with:  #1 new diagnosis of stage II invasive ductal carcinoma of the left breast she is status post mastectomy with finding of 2 foci of invasive ductal carcinoma both are ER/PR positive but the smaller focus measuring 0.7 cm is HER-2/neu positive with a ratio over 4. Patient was begun on adjuvant chemotherapy initially consisting of Taxol and Herceptin given weekly. However after 3 cycles of this patient  could not tolerate it. And this was discontinued.  #2 she was then begun on Herceptin every 3 weeks at adjuvantly with radiation therapy. She completed radiation in December 2013.  #3 she is now receiving letrozole 2.5 mg daily since December 2013 overall she's been tolerating it well.  #4 she was then begun on Herceptin every 3 weeks beginning on 04/11/12 - 02/2013.  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   #3 she'll followup with Dr. Lindi Adie in 6 months   She knows to call with any questions or concerns before her next appointment.    I spent 25 minutes counseling the patient face to face. The total time spent in the appointment was 30 minutes.  Carlton Adam, PA-C

## 2013-12-10 NOTE — Patient Instructions (Signed)
Keep your appointment for your mammogram later this month You will continue on Prolia every 6 months Followup in 6 months

## 2013-12-17 ENCOUNTER — Encounter (HOSPITAL_BASED_OUTPATIENT_CLINIC_OR_DEPARTMENT_OTHER): Payer: Self-pay | Admitting: Emergency Medicine

## 2013-12-17 ENCOUNTER — Inpatient Hospital Stay (HOSPITAL_BASED_OUTPATIENT_CLINIC_OR_DEPARTMENT_OTHER)
Admission: EM | Admit: 2013-12-17 | Discharge: 2013-12-23 | DRG: 444 | Disposition: A | Discharge status: Home / Self Care | Payer: Medicare HMO | Attending: Internal Medicine | Admitting: Internal Medicine

## 2013-12-17 ENCOUNTER — Emergency Department (HOSPITAL_BASED_OUTPATIENT_CLINIC_OR_DEPARTMENT_OTHER): Payer: Medicare HMO

## 2013-12-17 DIAGNOSIS — K8050 Calculus of bile duct without cholangitis or cholecystitis without obstruction: Principal | ICD-10-CM | POA: Diagnosis present

## 2013-12-17 DIAGNOSIS — Z87442 Personal history of urinary calculi: Secondary | ICD-10-CM

## 2013-12-17 DIAGNOSIS — K449 Diaphragmatic hernia without obstruction or gangrene: Secondary | ICD-10-CM | POA: Diagnosis present

## 2013-12-17 DIAGNOSIS — E785 Hyperlipidemia, unspecified: Secondary | ICD-10-CM | POA: Diagnosis present

## 2013-12-17 DIAGNOSIS — G4700 Insomnia, unspecified: Secondary | ICD-10-CM | POA: Diagnosis present

## 2013-12-17 DIAGNOSIS — Z803 Family history of malignant neoplasm of breast: Secondary | ICD-10-CM

## 2013-12-17 DIAGNOSIS — E43 Unspecified severe protein-calorie malnutrition: Secondary | ICD-10-CM | POA: Diagnosis present

## 2013-12-17 DIAGNOSIS — R740 Nonspecific elevation of levels of transaminase and lactic acid dehydrogenase [LDH]: Secondary | ICD-10-CM

## 2013-12-17 DIAGNOSIS — F3289 Other specified depressive episodes: Secondary | ICD-10-CM | POA: Diagnosis present

## 2013-12-17 DIAGNOSIS — Z853 Personal history of malignant neoplasm of breast: Secondary | ICD-10-CM

## 2013-12-17 DIAGNOSIS — R112 Nausea with vomiting, unspecified: Secondary | ICD-10-CM | POA: Diagnosis present

## 2013-12-17 DIAGNOSIS — M129 Arthropathy, unspecified: Secondary | ICD-10-CM | POA: Diagnosis present

## 2013-12-17 DIAGNOSIS — R7401 Elevation of levels of liver transaminase levels: Secondary | ICD-10-CM | POA: Diagnosis present

## 2013-12-17 DIAGNOSIS — R7402 Elevation of levels of lactic acid dehydrogenase (LDH): Secondary | ICD-10-CM | POA: Diagnosis present

## 2013-12-17 DIAGNOSIS — F329 Major depressive disorder, single episode, unspecified: Secondary | ICD-10-CM | POA: Diagnosis present

## 2013-12-17 DIAGNOSIS — J45909 Unspecified asthma, uncomplicated: Secondary | ICD-10-CM | POA: Diagnosis present

## 2013-12-17 DIAGNOSIS — K219 Gastro-esophageal reflux disease without esophagitis: Secondary | ICD-10-CM

## 2013-12-17 DIAGNOSIS — I10 Essential (primary) hypertension: Secondary | ICD-10-CM | POA: Diagnosis present

## 2013-12-17 DIAGNOSIS — R109 Unspecified abdominal pain: Secondary | ICD-10-CM

## 2013-12-17 DIAGNOSIS — A419 Sepsis, unspecified organism: Secondary | ICD-10-CM | POA: Diagnosis present

## 2013-12-17 DIAGNOSIS — Z923 Personal history of irradiation: Secondary | ICD-10-CM

## 2013-12-17 LAB — COMPREHENSIVE METABOLIC PANEL
ALK PHOS: 100 U/L (ref 39–117)
ALT: 55 U/L — AB (ref 0–35)
AST: 64 U/L — AB (ref 0–37)
Albumin: 3.5 g/dL (ref 3.5–5.2)
BILIRUBIN TOTAL: 1.7 mg/dL — AB (ref 0.3–1.2)
BUN: 23 mg/dL (ref 6–23)
CO2: 21 meq/L (ref 19–32)
Calcium: 8.8 mg/dL (ref 8.4–10.5)
Chloride: 100 mEq/L (ref 96–112)
Creatinine, Ser: 0.7 mg/dL (ref 0.50–1.10)
GFR calc Af Amer: >90 mL/min (ref >90)
GFR calc non Af Amer: 81 mL/min — ABNORMAL LOW (ref >90)
Glucose, Bld: 124 mg/dL — ABNORMAL HIGH (ref 70–99)
POTASSIUM: 3.9 meq/L (ref 3.7–5.3)
SODIUM: 137 meq/L (ref 137–147)
Total Protein: 7.6 g/dL (ref 6.0–8.3)

## 2013-12-17 LAB — CBC WITH DIFFERENTIAL/PLATELET
Basophils Absolute: 0 10*3/uL (ref 0.0–0.1)
Basophils Relative: 0 % (ref 0–1)
EOS PCT: 1 % (ref 0–5)
Eosinophils Absolute: 0.1 10*3/uL (ref 0.0–0.7)
HCT: 37.3 % (ref 36.0–46.0)
Hemoglobin: 12.4 g/dL (ref 12.0–15.0)
LYMPHS PCT: 15 % (ref 12–46)
Lymphs Abs: 2.2 10*3/uL (ref 0.7–4.0)
MCH: 29.4 pg (ref 26.0–34.0)
MCHC: 33.2 g/dL (ref 30.0–36.0)
MCV: 88.4 fL (ref 78.0–100.0)
Monocytes Absolute: 0.8 10*3/uL (ref 0.1–1.0)
Monocytes Relative: 5 % (ref 3–12)
NEUTROS PCT: 79 % — AB (ref 43–77)
Neutro Abs: 11.3 10*3/uL — ABNORMAL HIGH (ref 1.7–7.7)
PLATELETS: 303 10*3/uL (ref 150–400)
RBC: 4.22 MIL/uL (ref 3.87–5.11)
RDW: 14.2 % (ref 11.5–15.5)
WBC: 14.4 10*3/uL — ABNORMAL HIGH (ref 4.0–10.5)

## 2013-12-17 LAB — LIPASE, BLOOD: Lipase: 16 U/L (ref 11–59)

## 2013-12-17 LAB — TROPONIN I: Troponin I: <0.3 ng/mL (ref <0.30)

## 2013-12-17 MED ORDER — GABAPENTIN 300 MG PO CAPS
300.0000 mg | ORAL_CAPSULE | Freq: Every day | ORAL | Status: DC
  Administered 2013-12-18 – 2013-12-22 (×6): 300 mg via ORAL
  Filled 2013-12-17 (×9): qty 1

## 2013-12-17 MED ORDER — IOHEXOL 300 MG/ML  SOLN
25.0000 mL | Freq: Once | INTRAMUSCULAR | Status: AC | PRN
  Administered 2013-12-17: 25 mL via ORAL

## 2013-12-17 MED ORDER — PANTOPRAZOLE SODIUM 40 MG PO TBEC
40.0000 mg | DELAYED_RELEASE_TABLET | Freq: Every day | ORAL | Status: DC
  Administered 2013-12-18 – 2013-12-23 (×6): 40 mg via ORAL
  Filled 2013-12-17 (×6): qty 1

## 2013-12-17 MED ORDER — VALSARTAN-HYDROCHLOROTHIAZIDE 160-12.5 MG PO TABS: 2.0000 | ORAL_TABLET | Freq: Every morning | ORAL | Status: DC

## 2013-12-17 MED ORDER — HEPARIN SODIUM (PORCINE) 5000 UNIT/ML IJ SOLN
5000.0000 [IU] | Freq: Three times a day (TID) | INTRAMUSCULAR | Status: DC
  Administered 2013-12-18: 5000 [IU] via SUBCUTANEOUS
  Filled 2013-12-17 (×3): qty 1

## 2013-12-17 MED ORDER — METRONIDAZOLE IN NACL 5-0.79 MG/ML-% IV SOLN
500.0000 mg | Freq: Once | INTRAVENOUS | Status: AC
  Administered 2013-12-17: 500 mg via INTRAVENOUS

## 2013-12-17 MED ORDER — MORPHINE SULFATE 4 MG/ML IJ SOLN
4.0000 mg | Freq: Once | INTRAMUSCULAR | Status: AC
  Administered 2013-12-17: 4 mg via INTRAVENOUS
  Filled 2013-12-17: qty 1

## 2013-12-17 MED ORDER — IOHEXOL 300 MG/ML  SOLN
100.0000 mL | Freq: Once | INTRAMUSCULAR | Status: AC | PRN
  Administered 2013-12-17: 100 mL via INTRAVENOUS

## 2013-12-17 MED ORDER — LETROZOLE 2.5 MG PO TABS
2.5000 mg | ORAL_TABLET | Freq: Every day | ORAL | Status: DC
  Administered 2013-12-18 – 2013-12-23 (×6): 2.5 mg via ORAL
  Filled 2013-12-17 (×6): qty 1

## 2013-12-17 MED ORDER — CIPROFLOXACIN IN D5W 400 MG/200ML IV SOLN
400.0000 mg | Freq: Two times a day (BID) | INTRAVENOUS | Status: DC
  Administered 2013-12-18 – 2013-12-22 (×10): 400 mg via INTRAVENOUS
  Filled 2013-12-17 (×11): qty 200

## 2013-12-17 MED ORDER — ONDANSETRON HCL 4 MG/2ML IJ SOLN
4.0000 mg | Freq: Once | INTRAMUSCULAR | Status: AC
  Administered 2013-12-17: 4 mg via INTRAVENOUS
  Filled 2013-12-17: qty 2

## 2013-12-17 MED ORDER — ZOLPIDEM TARTRATE 5 MG PO TABS
5.0000 mg | ORAL_TABLET | Freq: Every day | ORAL | Status: DC
  Administered 2013-12-18 – 2013-12-22 (×6): 5 mg via ORAL
  Filled 2013-12-17 (×7): qty 1

## 2013-12-17 MED ORDER — IRBESARTAN 300 MG PO TABS
300.0000 mg | ORAL_TABLET | Freq: Every day | ORAL | Status: DC
  Administered 2013-12-19 – 2013-12-23 (×4): 300 mg via ORAL
  Filled 2013-12-17 (×6): qty 1

## 2013-12-17 MED ORDER — SODIUM CHLORIDE 0.9 % IV BOLUS (SEPSIS)
1000.0000 mL | INTRAVENOUS | Status: AC
  Administered 2013-12-17: 1000 mL via INTRAVENOUS

## 2013-12-17 MED ORDER — SIMVASTATIN 20 MG PO TABS
20.0000 mg | ORAL_TABLET | Freq: Every day | ORAL | Status: DC
  Administered 2013-12-18 – 2013-12-22 (×5): 20 mg via ORAL
  Filled 2013-12-17 (×6): qty 1

## 2013-12-17 MED ORDER — CITALOPRAM HYDROBROMIDE 20 MG PO TABS
20.0000 mg | ORAL_TABLET | Freq: Every day | ORAL | Status: DC
  Administered 2013-12-18 – 2013-12-23 (×6): 20 mg via ORAL
  Filled 2013-12-17 (×6): qty 1

## 2013-12-17 MED ORDER — CIPROFLOXACIN IN D5W 400 MG/200ML IV SOLN
400.0000 mg | Freq: Once | INTRAVENOUS | Status: AC
  Administered 2013-12-17: 400 mg via INTRAVENOUS
  Filled 2013-12-17: qty 200

## 2013-12-17 MED ORDER — METRONIDAZOLE IN NACL 5-0.79 MG/ML-% IV SOLN
500.0000 mg | Freq: Three times a day (TID) | INTRAVENOUS | Status: DC
  Administered 2013-12-18 – 2013-12-22 (×12): 500 mg via INTRAVENOUS
  Filled 2013-12-17 (×16): qty 100

## 2013-12-17 MED ORDER — HYDROCHLOROTHIAZIDE 25 MG PO TABS
25.0000 mg | ORAL_TABLET | Freq: Every day | ORAL | Status: DC
  Administered 2013-12-19 – 2013-12-23 (×4): 25 mg via ORAL
  Filled 2013-12-17 (×6): qty 1

## 2013-12-17 MED ORDER — SODIUM CHLORIDE 0.9 % IV SOLN
INTRAVENOUS | Status: DC
  Administered 2013-12-18 – 2013-12-21 (×7): via INTRAVENOUS

## 2013-12-17 MED ORDER — AMLODIPINE BESYLATE 10 MG PO TABS
10.0000 mg | ORAL_TABLET | Freq: Every day | ORAL | Status: DC
  Administered 2013-12-19 – 2013-12-23 (×4): 10 mg via ORAL
  Filled 2013-12-17 (×6): qty 1

## 2013-12-17 NOTE — ED Notes (Signed)
Vomiting x1 week "off and on". Pt points all around her epigastric area as her area of pain.

## 2013-12-17 NOTE — ED Notes (Signed)
Report given to Justin with [facility] 

## 2013-12-17 NOTE — ED Notes (Signed)
Pt awaiting transport at this time. Family at bedside.

## 2013-12-17 NOTE — ED Notes (Signed)
Report given to Nancy RN

## 2013-12-17 NOTE — ED Provider Notes (Signed)
CSN: [id number]     Arrival date & time 12/17/13  1732 History   This chart was scribed for Blanchard Kelch, MD by Roxan Diesel, ED scribe.  This patient was seen in room MH08/MH08 and the patient's care was started at 6:30 PM.     Chief Complaint  Patient presents with  . Abdominal Pain     Patient is a 80 y.o. female presenting with abdominal pain. The history is provided by the patient. No language interpreter was used.  Abdominal Pain Pain location:  Epigastric Pain radiates to:  Back Pain severity:  Severe (10/10) Duration:  1 week Timing:  Intermittent Associated symptoms: nausea and vomiting   Associated symptoms: no chest pain, no chills, no cough, no dysuria, no fever, no shortness of breath and no sore throat   Risk factors: being elderly   Risk factors comment:  H/o pancreatitis  HPI Comments: Brandy [name] is a 80 y.o. female with a history of breast cancer (in remission) and pancreatitis who presents to the Emergency Department complaining of intermittent epigastric abdominal pain and associated nausea and vomiting beginning one week ago. Patient characterizes the pain as a 10/10 in severity currently. She reports that the pain feels sharp and is similar to the pain she experienced when she had pancreatitis. She reports that it radiates to her back and is more severe in the middle of her stomach.  She states she has been vomiting and has been unable to tolerate any food for the past week. She denies diarrhea, fever and dysuria.   Past Medical History  Diagnosis Date  . Asthma   . Hyperlipidemia     takes Simvastatin daily  . Hypertension     takes Amlodipine and Diovan daily  . Cancer     left breast  . Bronchitis     hx of;last time >71yr ago  . Arthritis     shoulders  . Dysphagia   . H/O hiatal hernia   . GERD (gastroesophageal reflux disease)     takes Prilosec prn  . Urinary urgency   . History of kidney stones     many yrs ago  . Depression      takes Celexa daily  . Insomnia     takes Ambien nightly  . Breast cancer 12/18/11    left breast masectomy=metastatic ca in (1/1) lymph node ,invasive ductal ca,2 foci,,dcis,lymph ovascular invasion identified,surgical resection margins neg for ca,additional tissue=benign skin and subcutaneous tissue  . Breast CA 11/23/11    left breast 3 o'clock bx=high grade ductal ca in situ w/comedononecrosis and calcification,dcis,lymphovascular invasion present ,ER?PR+positive  . Full dentures   . Hx of radiation therapy 04/26/12 -06/10/12    left breast   Past Surgical History  Procedure Laterality Date  . Breast biopsy  1998    left  . Total shoulder replacement  2011    left  . Eus  06/04/2011    Procedure: UPPER ENDOSCOPIC ULTRASOUND (EUS) LINEAR;  Surgeon: Owens Loffler, MD;  Location: [facility];  Service: Endoscopy;  Laterality: N/A;  . Exploratory laparotomy       biopsy of intra-abdominal mass  . Bladder tack    . Tubal ligation    . Appendectomy    . Dilation and curettage of uterus    . Esophagogastroduodenoscopy    . Mastectomy w/ sentinel node biopsy  12/18/2011    Procedure: MASTECTOMY WITH SENTINEL LYMPH NODE BIOPSY;  Surgeon: Rolm Bookbinder, MD;  Location: [place];  Service: General;  Laterality: Left;  . Portacath placement  01/27/2012    Procedure: INSERTION PORT-A-CATH;  Surgeon: Rolm Bookbinder, MD;  Location: [place];  Service: General;  Laterality: Right;  PORT PLACEMENT  . Port-a-cath removal Right 06/15/2013    Procedure: REMOVAL PORT-A-CATH;  Surgeon: Rolm Bookbinder, MD;  Location: [place];  Service: General;  Laterality: Right;   Family History  Problem Relation Age of Onset  . Cancer Father     prostate  . Cancer Other     breast ca   History  Substance Use Topics  . Smoking status: Never Smoker   . Smokeless tobacco: Never Used  . Alcohol Use: No   OB History   Grav Para Term Preterm Abortions TAB SAB Ect Mult  Living                 Review of Systems  Constitutional: Negative for fever and chills.  HENT: Negative for congestion, rhinorrhea and sore throat.   Eyes: Negative for visual disturbance.  Respiratory: Negative for cough and shortness of breath.   Cardiovascular: Negative for chest pain and leg swelling.  Gastrointestinal: Positive for nausea, vomiting and abdominal pain.  Genitourinary: Negative for dysuria.  Musculoskeletal: Negative for back pain and neck pain.  Skin: Negative for rash.  Neurological: Negative for headaches.  Hematological: Does not bruise/bleed easily.  Psychiatric/Behavioral: Negative for confusion.      Allergies  Review of patient's allergies indicates no known allergies.  Home Medications   Prior to Admission medications   Medication Sig Start Date End Date Taking? Authorizing Provider  amLODipine (NORVASC) 10 MG tablet Take 10 mg by mouth daily.  02/13/11   Historical Provider, MD  Calcium Carbonate-Vitamin D (CALCIUM + D PO) Take by mouth daily.    Minette Headland, NP  citalopram (CELEXA) 20 MG tablet Take 20 mg by mouth daily.    Historical Provider, MD  DIOVAN HCT 160-12.5 MG per tablet Take 2 tablets by mouth every morning.  02/13/11   Historical Provider, MD  ergocalciferol (VITAMIN D2) 50000 UNITS capsule Take 1 capsule (50,000 Units total) by mouth once a week. 06/26/13   Minette Headland, NP  gabapentin (NEURONTIN) 100 MG capsule TAKE 3 CAPSULES AT BEDTIME 06/12/13   Deatra Robinson, MD  letrozole [facility]) 2.5 MG tablet TAKE 1 TABLET (2.5 MG TOTAL) BY MOUTH DAILY. 07/14/13   Minette Headland, NP  omeprazole (PRILOSEC) 20 MG capsule Take 20 mg by mouth daily.    Historical Provider, MD  oxyCODONE (OXY IR/ROXICODONE) 5 MG immediate release tablet Take 1 tablet (5 mg total) by mouth every 4 (four) hours as needed. 06/08/13   Deatra Robinson, MD  oxyCODONE-acetaminophen (PERCOCET) 10-325 MG per tablet Take 1 tablet by mouth every 6 (six) hours as  needed for pain. 06/15/13 06/15/14  Rolm Bookbinder, MD  simvastatin (ZOCOR) 20 MG tablet Take 20 mg by mouth daily.  02/16/11   Historical Provider, MD  spironolactone (ALDACTONE) 25 MG tablet Take 0.5 tablets (12.5 mg total) by mouth daily. 04/19/13   [name] D Ninfa Meeker, NP  zolpidem (AMBIEN) 10 MG tablet Take 10 mg by mouth at bedtime.    Historical Provider, MD   BP 149/63  Pulse 85  Temp(Src) 99 F (37.2 C) (Oral)  Resp 36  Ht 5' (1.524 m)  Wt 154 lb (69.854 kg)  BMI 30.08 kg/m2  SpO2 97% Physical Exam  Nursing note and vitals reviewed. Constitutional: She is  oriented to person, place, and time. She appears well-developed and well-nourished. No distress.  HENT:  Head: Normocephalic and atraumatic.  Mouth/Throat: Oropharynx is clear and moist.  Eyes: Conjunctivae and EOM are normal. Pupils are equal, round, and reactive to light.  Neck: Normal range of motion. Neck supple. No tracheal deviation present.  Cardiovascular: Normal rate, regular rhythm and normal heart sounds.  Exam reveals no gallop and no friction rub.   No murmur heard. Pulmonary/Chest: Effort normal and breath sounds normal. No respiratory distress. She has no wheezes. She has no rales.  Abdominal: Soft. Bowel sounds are normal. She exhibits no distension and no mass. There is tenderness (mild in epigastric area). There is no rebound and no guarding.  Musculoskeletal: Normal range of motion.  Neurological: She is alert and oriented to person, place, and time.  Skin: Skin is warm and dry.  Psychiatric: She has a normal mood and affect. Her behavior is normal.    ED Course  Procedures (including critical care time) DIAGNOSTIC STUDIES: Oxygen Saturation is 97% on room air, normal by my interpretation.    COORDINATION OF CARE: 6:34 PM-Discussed treatment plan which includes labs and an EKG with pt at bedside and pt agreed to plan.     Labs Review Labs Reviewed  CBC WITH DIFFERENTIAL - Abnormal; Notable for the  following:    WBC 14.4 (*)    Neutrophils Relative % 79 (*)    Neutro Abs 11.3 (*)    All other components within normal limits  COMPREHENSIVE METABOLIC PANEL - Abnormal; Notable for the following:    Glucose, Bld 124 (*)    AST 64 (*)    ALT 55 (*)    Total Bilirubin 1.7 (*)    GFR calc non Af Amer 81 (*)    All other components within normal limits  CBC - Abnormal; Notable for the following:    WBC 13.7 (*)    RBC 3.52 (*)    Hemoglobin 10.1 (*)    HCT 30.7 (*)    All other components within normal limits  COMPREHENSIVE METABOLIC PANEL - Abnormal; Notable for the following:    Glucose, Bld 135 (*)    BUN 24 (*)    Calcium 7.6 (*)    Albumin 2.6 (*)    AST 71 (*)    ALT 69 (*)    Total Bilirubin 2.1 (*)    GFR calc non Af Amer 55 (*)    GFR calc Af Amer 63 (*)    All other components within normal limits  CULTURE, BLOOD (ROUTINE X 2)  CULTURE, BLOOD (ROUTINE X 2)  TROPONIN I  LIPASE, BLOOD    Imaging Review Ct Abdomen Pelvis Wo Contrast  12/17/2013   CLINICAL DATA:  Intermittent epigastric abdominal pain with nausea and vomiting for 1 week. History of asthma, hypertension and breast cancer. History of kidney stones and pancreatitis.  EXAM: CT ABDOMEN AND PELVIS WITHOUT CONTRAST  TECHNIQUE: Multidetector CT imaging of the abdomen and pelvis was performed following the standard protocol without IV contrast. Study was attempted with intravenous contrast, although IV access was lost.  COMPARISON:  PET-CT 01/04/2012.  Abdominal pelvic CT 09/21/2011.  FINDINGS: The lung bases are clear. There is no pleural or pericardial effusion. Mitral annular calcifications and a moderate size hiatal hernia are noted.  There is moderate intrahepatic and diffuse extrahepatic biliary dilatation. The common bile duct measures up to 2.2 cm in diameter. There are high-density filling defects within the distal common  bile duct, best seen on the reformatted images, suspicious for choledocholithiasis. No  pancreatic or ampullary mass is identified. There is no significant dilatation of the pancreatic duct or peripancreatic inflammation. The gallbladder is present and appears normal. No focal liver lesions are seen.  The spleen and adrenal glands appear stable. Both kidneys demonstrate mild cortical thinning with grossly stable cortical and parapelvic cyst formation. There is no hydronephrosis.  There is a large recurrent ventral hernia with left greater than right components. There is herniated small bowel as well as a small portion of the transverse colon. No signs of bowel incarceration or obstruction are seen. There is mild mesenteric edema. No focal extraluminal fluid collections are identified. Sigmoid colon diverticular changes are present.  There is stable aortoiliac atherosclerosis. No ascites or enlarged lymph nodes are seen. There is no evidence of pelvic mass. The ureters and bladder appear normal. There are stable degenerative changes throughout the spine with mild chronic compression deformities in the lower thoracic region. No acute osseous findings or metastases are identified.  IMPRESSION: 1. Progressive intra and extrahepatic biliary dilatation with increased density in the distal common bile duct suspicious for choledocholithiasis. MRCP may be helpful for confirmation and to exclude an ampullary lesion. 2. Recurrent large ventral hernia containing portions of the small bowel and transverse colon. No signs of bowel incarceration or obstruction. 3. No evidence of metastatic disease.   Electronically Signed   By: Camie Patience M.D.   On: 12/17/2013 20:23   Mr Abdomen Mrcp Wo Cm  12/18/2013   CLINICAL DATA:  Biliary ductal dilatation. Nausea and vomiting. Personal history of pancreatitis, breast carcinoma, and lymphoma.  EXAM: MRI ABDOMEN WITHOUT  (INCLUDING MRCP)  TECHNIQUE: Multiplanar multisequence MR imaging of the abdomen was performed. Heavily T2-weighted images of the biliary and pancreatic  ducts were obtained, and three-dimensional MRCP images were rendered by post processing.  COMPARISON:  Noncontrast CT on 12/17/2013  FINDINGS: Diffuse biliary ductal dilatation seen, with proximal common bile duct measuring 17 mm in diameter. A smooth stricture is seen involving the intrapancreatic portion of the distal common bile duct which has a benign appearance. No pancreatic mass visualized on this noncontrast study and there is no evidence of pancreatic ductal dilatation. At least 2 large calculi are seen in the distal common bile duct measuring approximately 10 mm and 11 mm in diameter. Gallbladder is unremarkable in appearance.  Noncontrast images of the liver, spleen, adrenal glands are normal in appearance. Small bilateral renal parenchymal cysts and left sided parapelvic cysts are noted, without definite evidence of renal mass or hydronephrosis on this noncontrast study.  Small hiatal hernia noted. No evidence of abdominal lymphadenopathy. No other inflammatory process or abnormal fluid collections seen within the abdomen. Visualized abdominal bowel loops are nondilated.  IMPRESSION: Diffuse biliary ductal dilatation, with choledocholithiasis and benign appearing distal common bile duct stricture.  No evidence of pancreatic mass or pancreatic ductal dilatation.   Electronically Signed   By: Earle Gell M.D.   On: 12/18/2013 13:08     EKG Interpretation   Date/Time:  Sunday December 17 2013 17:42:56 EDT Ventricular Rate:  83 PR Interval:  156 QRS Duration: 100 QT Interval:  374 QTC Calculation: 439 R Axis:   56 Text Interpretation:  Normal sinus rhythm Anterior infarct , age  undetermined No significant change since last tracing Confirmed by  Kelise Kuch  MD, Grayton Lobo ([id number]) on 12/17/2013 6:51:01 PM      MDM   Final diagnoses:  Choledocholithiasis  8:48 PM 80 y.o. female here with intermittent epigastric and right upper quadrant pain as well as nausea and vomiting for the last week.  Afebrile and vital signs are unremarkable here. Will get screening labs and imaging.  CT concerning for choledocholithiasis. I discussed the case with the hospitalist at [facility]. Will admit. Will order Cipro Flagyl for pt until definitive imaging performed.       I personally performed the services described in this documentation, which was scribed in my presence. The recorded information has been reviewed and is accurate.    Blanchard Kelch, MD 12/18/13 1540

## 2013-12-18 ENCOUNTER — Inpatient Hospital Stay (HOSPITAL_COMMUNITY): Payer: Medicare HMO

## 2013-12-18 DIAGNOSIS — E43 Unspecified severe protein-calorie malnutrition: Secondary | ICD-10-CM | POA: Insufficient documentation

## 2013-12-18 LAB — COMPREHENSIVE METABOLIC PANEL
ALT: 69 U/L — ABNORMAL HIGH (ref 0–35)
AST: 71 U/L — ABNORMAL HIGH (ref 0–37)
Albumin: 2.6 g/dL — ABNORMAL LOW (ref 3.5–5.2)
Alkaline Phosphatase: 108 U/L (ref 39–117)
BILIRUBIN TOTAL: 2.1 mg/dL — AB (ref 0.3–1.2)
BUN: 24 mg/dL — AB (ref 6–23)
CO2: 23 mEq/L (ref 19–32)
Calcium: 7.6 mg/dL — ABNORMAL LOW (ref 8.4–10.5)
Chloride: 104 mEq/L (ref 96–112)
Creatinine, Ser: 0.97 mg/dL (ref 0.50–1.10)
GFR calc Af Amer: 63 mL/min — ABNORMAL LOW (ref >90)
GFR calc non Af Amer: 55 mL/min — ABNORMAL LOW (ref >90)
Glucose, Bld: 135 mg/dL — ABNORMAL HIGH (ref 70–99)
Potassium: 4.1 mEq/L (ref 3.7–5.3)
Sodium: 138 mEq/L (ref 137–147)
Total Protein: 6 g/dL (ref 6.0–8.3)

## 2013-12-18 LAB — CBC
HEMATOCRIT: 30.7 % — AB (ref 36.0–46.0)
Hemoglobin: 10.1 g/dL — ABNORMAL LOW (ref 12.0–15.0)
MCH: 28.7 pg (ref 26.0–34.0)
MCHC: 32.9 g/dL (ref 30.0–36.0)
MCV: 87.2 fL (ref 78.0–100.0)
Platelets: 234 10*3/uL (ref 150–400)
RBC: 3.52 MIL/uL — AB (ref 3.87–5.11)
RDW: 14.4 % (ref 11.5–15.5)
WBC: 13.7 10*3/uL — AB (ref 4.0–10.5)

## 2013-12-18 MED ORDER — ONDANSETRON HCL 4 MG/2ML IJ SOLN
4.0000 mg | Freq: Four times a day (QID) | INTRAMUSCULAR | Status: DC | PRN
  Administered 2013-12-20: 4 mg via INTRAVENOUS
  Filled 2013-12-18: qty 2

## 2013-12-18 MED ORDER — MORPHINE SULFATE 2 MG/ML IJ SOLN
2.0000 mg | INTRAMUSCULAR | Status: DC | PRN
  Administered 2013-12-20: 2 mg via INTRAVENOUS
  Administered 2013-12-20 – 2013-12-22 (×3): 4 mg via INTRAVENOUS
  Filled 2013-12-18: qty 1
  Filled 2013-12-18 (×3): qty 2

## 2013-12-18 NOTE — Consult Note (Signed)
Unassigned patient Reason for Consult: Choledocholithiasis on MRCP. Referring Physician: THP.  Brandy [name] is an 80 y.o. female.  HPI: 80 year old white female, with multiple medical issues listed below, presented to the ER yesterday with a 1 week history of severe epigastric pain and nausea with vomiting. She rates the pain at a 10/10 when it is at its maximum intensity. She has lost about 10 lbs through all of this. She had a bout of pancreatitis in 2012 when she had an EUS performed by Dr. Ranelle Oyster and a possible mass in the the head of the pancreas but repeated biopsies revealed no evidence of malignancy. She also had a ?omental or a retroperitoneal mass biopsied in 2011 that was thought to be fat necrosis with omental tissue but again no malignancy was identified. She had an MRCP that revealed diffuse dilated ducts with a stricture in the intrapancreatic portion of the duct. She was also noted to have 2 large stone, 10 mm and 11 mm in the distal CBD.    Past Medical History  Diagnosis Date  . Asthma   . Hyperlipidemia     takes Simvastatin daily  . Hypertension     takes Amlodipine and Diovan daily  . Cancer     left breast  . Bronchitis     hx of;last time >67yrago  . Arthritis     shoulders  . Dysphagia   . H/O hiatal hernia   . GERD (gastroesophageal reflux disease)     takes Prilosec prn  . Urinary urgency   . History of kidney stones     many yrs ago  . Depression     takes Celexa daily  . Insomnia     takes Ambien nightly  . Breast cancer 12/18/11    left breast masectomy=metastatic ca in (1/1) lymph node ,invasive ductal ca,2 foci,,dcis,lymph ovascular invasion identified,surgical resection margins neg for ca,additional tissue=benign skin and subcutaneous tissue  . Breast CA 11/23/11    left breast 3 o'clock bx=high grade ductal ca in situ w/comedononecrosis and calcification,dcis,lymphovascular invasion present ,ER?PR+positive  . Full dentures   . Hx of  radiation therapy 04/26/12 -06/10/12    left breast   Past Surgical History  Procedure Laterality Date  . Breast biopsy  1998    left  . Total shoulder replacement  2011    left  . Eus  06/04/2011    Procedure: UPPER ENDOSCOPIC ULTRASOUND (EUS) LINEAR;  Surgeon: DOwens Loffler MD;  Location: [facility];  Service: Endoscopy;  Laterality: N/A;  . Exploratory laparotomy       biopsy of intra-abdominal mass  . Bladder tack    . Tubal ligation    . Appendectomy    . Dilation and curettage of uterus    . Endoscopic ultrasound with FNA 2012    . Mastectomy w/ sentinel node biopsy  12/18/2011    Procedure: MASTECTOMY WITH SENTINEL LYMPH NODE BIOPSY;  Surgeon: MRolm Bookbinder MD;  [place] [facility]  Service: General;  Laterality: Left;  . Portacath placement  01/27/2012    Procedure: INSERTION PORT-A-CATH;  Surgeon: MRolm Bookbinder MD;  [place] [facility]  Service: General;  Laterality: Right;  PORT PLACEMENT  . Port-a-cath removal Right 06/15/2013    Procedure: REMOVAL PORT-A-CATH;  Surgeon: MRolm Bookbinder MD;  [place] [facility]  Service: General;  Laterality: Right;   Family History  Problem Relation Age of Onset  . Cancer Father     prostate  .  Cancer Other     breast ca   Social History:  reports that she has never smoked. She has never used smokeless tobacco. She reports that she does not drink alcohol or use illicit drugs.  Allergies: No Known Allergies  Medications: I have reviewed the patient's current medications.  Results for orders placed during the hospital encounter of 12/17/13 (from the past 48 hour(s))  TROPONIN I     Status: None   Collection Time    12/17/13  6:05 PM      Result Value Ref Range   Troponin I <0.30  <0.30 ng/mL   Comment:            Due to the release kinetics of cTnI,     a negative result within the first hours     of the onset of symptoms does not rule out     myocardial infarction with certainty.       If myocardial infarction is still suspected,     repeat the test at appropriate intervals.  CBC WITH DIFFERENTIAL     Status: Abnormal   Collection Time    12/17/13  6:05 PM      Result Value Ref Range   WBC 14.4 (*) 4.0 - 10.5 K/uL   Comment: WHITE COUNT CONFIRMED ON SMEAR   RBC 4.22  3.87 - 5.11 MIL/uL   Hemoglobin 12.4  12.0 - 15.0 g/dL   HCT 37.3  36.0 - 46.0 %   MCV 88.4  78.0 - 100.0 fL   MCH 29.4  26.0 - 34.0 pg   MCHC 33.2  30.0 - 36.0 g/dL   RDW 14.2  11.5 - 15.5 %   Platelets 303  150 - 400 K/uL   Comment: PLATELET COUNT CONFIRMED BY SMEAR   Neutrophils Relative % 79 (*) 43 - 77 %   Neutro Abs 11.3 (*) 1.7 - 7.7 K/uL   Lymphocytes Relative 15  12 - 46 %   Lymphs Abs 2.2  0.7 - 4.0 K/uL   Monocytes Relative 5  3 - 12 %   Monocytes Absolute 0.8  0.1 - 1.0 K/uL   Eosinophils Relative 1  0 - 5 %   Eosinophils Absolute 0.1  0.0 - 0.7 K/uL   Basophils Relative 0  0 - 1 %   Basophils Absolute 0.0  0.0 - 0.1 K/uL   WBC Morphology TOXIC GRANULATION    COMPREHENSIVE METABOLIC PANEL     Status: Abnormal   Collection Time    12/17/13  6:05 PM      Result Value Ref Range   Sodium 137  137 - 147 mEq/L   Potassium 3.9  3.7 - 5.3 mEq/L   Chloride 100  96 - 112 mEq/L   CO2 21  19 - 32 mEq/L   Glucose, Bld 124 (*) 70 - 99 mg/dL   BUN 23  6 - 23 mg/dL   Creatinine, Ser 0.70  0.50 - 1.10 mg/dL   Calcium 8.8  8.4 - 10.5 mg/dL   Total Protein 7.6  6.0 - 8.3 g/dL   Albumin 3.5  3.5 - 5.2 g/dL   AST 64 (*) 0 - 37 U/L   ALT 55 (*) 0 - 35 U/L   Alkaline Phosphatase 100  39 - 117 U/L   Total Bilirubin 1.7 (*) 0.3 - 1.2 mg/dL   GFR calc non Af Amer 81 (*) >90 mL/min   GFR calc Af Amer >90  >90 mL/min   Comment: (  NOTE)     The eGFR has been calculated using the CKD EPI equation.     This calculation has not been validated in all clinical situations.     eGFR's persistently <90 mL/min signify possible Chronic Kidney     Disease.  LIPASE, BLOOD     Status: None   Collection Time     12/17/13  6:05 PM      Result Value Ref Range   Lipase 16  11 - 59 U/L  CBC     Status: Abnormal   Collection Time    12/18/13  7:36 AM      Result Value Ref Range   WBC 13.7 (*) 4.0 - 10.5 K/uL   RBC 3.52 (*) 3.87 - 5.11 MIL/uL   Hemoglobin 10.1 (*) 12.0 - 15.0 g/dL   HCT 30.7 (*) 36.0 - 46.0 %   MCV 87.2  78.0 - 100.0 fL   MCH 28.7  26.0 - 34.0 pg   MCHC 32.9  30.0 - 36.0 g/dL   RDW 14.4  11.5 - 15.5 %   Platelets 234  150 - 400 K/uL  COMPREHENSIVE METABOLIC PANEL     Status: Abnormal   Collection Time    12/18/13  7:36 AM      Result Value Ref Range   Sodium 138  137 - 147 mEq/L   Potassium 4.1  3.7 - 5.3 mEq/L   Chloride 104  96 - 112 mEq/L   CO2 23  19 - 32 mEq/L   Glucose, Bld 135 (*) 70 - 99 mg/dL   BUN 24 (*) 6 - 23 mg/dL   Creatinine, Ser 0.97  0.50 - 1.10 mg/dL   Calcium 7.6 (*) 8.4 - 10.5 mg/dL   Total Protein 6.0  6.0 - 8.3 g/dL   Albumin 2.6 (*) 3.5 - 5.2 g/dL   AST 71 (*) 0 - 37 U/L   ALT 69 (*) 0 - 35 U/L   Alkaline Phosphatase 108  39 - 117 U/L   Total Bilirubin 2.1 (*) 0.3 - 1.2 mg/dL   GFR calc non Af Amer 55 (*) >90 mL/min   GFR calc Af Amer 63 (*) >90 mL/min   Comment: (NOTE)     The eGFR has been calculated using the CKD EPI equation.     This calculation has not been validated in all clinical situations.     eGFR's persistently <90 mL/min signify possible Chronic Kidney     Disease.   Ct Abdomen Pelvis Wo Contrast  12/17/2013   CLINICAL DATA:  Intermittent epigastric abdominal pain with nausea and vomiting for 1 week. History of asthma, hypertension and breast cancer. History of kidney stones and pancreatitis.  EXAM: CT ABDOMEN AND PELVIS WITHOUT CONTRAST  TECHNIQUE: Multidetector CT imaging of the abdomen and pelvis was performed following the standard protocol without IV contrast. Study was attempted with intravenous contrast, although IV access was lost.  COMPARISON:  PET-CT 01/04/2012.  Abdominal pelvic CT 09/21/2011.  FINDINGS: The lung bases  are clear. There is no pleural or pericardial effusion. Mitral annular calcifications and a moderate size hiatal hernia are noted.  There is moderate intrahepatic and diffuse extrahepatic biliary dilatation. The common bile duct measures up to 2.2 cm in diameter. There are high-density filling defects within the distal common bile duct, best seen on the reformatted images, suspicious for choledocholithiasis. No pancreatic or ampullary mass is identified. There is no significant dilatation of the pancreatic duct or peripancreatic inflammation. The gallbladder is  present and appears normal. No focal liver lesions are seen.  The spleen and adrenal glands appear stable. Both kidneys demonstrate mild cortical thinning with grossly stable cortical and parapelvic cyst formation. There is no hydronephrosis.  There is a large recurrent ventral hernia with left greater than right components. There is herniated small bowel as well as a small portion of the transverse colon. No signs of bowel incarceration or obstruction are seen. There is mild mesenteric edema. No focal extraluminal fluid collections are identified. Sigmoid colon diverticular changes are present.  There is stable aortoiliac atherosclerosis. No ascites or enlarged lymph nodes are seen. There is no evidence of pelvic mass. The ureters and bladder appear normal. There are stable degenerative changes throughout the spine with mild chronic compression deformities in the lower thoracic region. No acute osseous findings or metastases are identified.  IMPRESSION: 1. Progressive intra and extrahepatic biliary dilatation with increased density in the distal common bile duct suspicious for choledocholithiasis. MRCP may be helpful for confirmation and to exclude an ampullary lesion. 2. Recurrent large ventral hernia containing portions of the small bowel and transverse colon. No signs of bowel incarceration or obstruction. 3. No evidence of metastatic disease.    Electronically Signed   By: Camie Patience M.D.   On: 12/17/2013 20:23   Mr Abdomen Mrcp Wo Cm  12/18/2013   CLINICAL DATA:  Biliary ductal dilatation. Nausea and vomiting. Personal history of pancreatitis, breast carcinoma, and lymphoma.  EXAM: MRI ABDOMEN WITHOUT  (INCLUDING MRCP)  TECHNIQUE: Multiplanar multisequence MR imaging of the abdomen was performed. Heavily T2-weighted images of the biliary and pancreatic ducts were obtained, and three-dimensional MRCP images were rendered by post processing.  COMPARISON:  Noncontrast CT on 12/17/2013  FINDINGS: Diffuse biliary ductal dilatation seen, with proximal common bile duct measuring 17 mm in diameter. A smooth stricture is seen involving the intrapancreatic portion of the distal common bile duct which has a benign appearance. No pancreatic mass visualized on this noncontrast study and there is no evidence of pancreatic ductal dilatation. At least 2 large calculi are seen in the distal common bile duct measuring approximately 10 mm and 11 mm in diameter. Gallbladder is unremarkable in appearance.  Noncontrast images of the liver, spleen, adrenal glands are normal in appearance. Small bilateral renal parenchymal cysts and left sided parapelvic cysts are noted, without definite evidence of renal mass or hydronephrosis on this noncontrast study.  Small hiatal hernia noted. No evidence of abdominal lymphadenopathy. No other inflammatory process or abnormal fluid collections seen within the abdomen. Visualized abdominal bowel loops are nondilated.  IMPRESSION: Diffuse biliary ductal dilatation, with choledocholithiasis and benign appearing distal common bile duct stricture.  No evidence of pancreatic mass or pancreatic ductal dilatation.   Electronically Signed   By: Earle Gell M.D.   On: 12/18/2013 13:08   Review of Systems  Constitutional: Positive for fever, weight loss, malaise/fatigue and diaphoresis. Negative for chills.  HENT: Negative.   Respiratory:  Positive for shortness of breath. Negative for cough, hemoptysis, sputum production and wheezing.   Cardiovascular: Negative for chest pain, palpitations, orthopnea, claudication, leg swelling and PND.  Gastrointestinal: Positive for heartburn, nausea, vomiting and abdominal pain. Negative for blood in stool and melena.  Genitourinary: Positive for urgency. Negative for dysuria, frequency, hematuria and flank pain.  Musculoskeletal: Positive for back pain and joint pain.  Skin: Negative.   Neurological: Positive for weakness.  Endo/Heme/Allergies: Negative.   Psychiatric/Behavioral: Positive for depression. Negative for suicidal ideas, hallucinations, memory loss  and substance abuse. The patient is nervous/anxious and has insomnia.    Blood pressure 103/50, pulse 80, temperature 98 F (36.7 C), temperature source Oral, resp. rate 16, height 5' (1.524 m), weight 69.3 kg (152 lb 12.5 oz), SpO2 99.00%. Physical Exam  Constitutional: She is oriented to person, place, and time. She appears well-developed and well-nourished.  HENT:  Head: Normocephalic and atraumatic.  Eyes: Conjunctivae and EOM are normal. Pupils are equal, round, and reactive to light.  Neck: Normal range of motion. Neck supple.  Cardiovascular: Normal rate and regular rhythm.   Respiratory: Effort normal and breath sounds normal.  GI: Soft. Bowel sounds are normal.    Large reducible ventral hernia noted to the left of the umbilicus  Musculoskeletal: Normal range of motion.  Neurological: She is alert and oriented to person, place, and time.  Skin: Skin is warm and dry.  Psychiatric: She has a normal mood and affect. Her behavior is normal. Judgment and thought content normal.   Assessment/Plan: 1) Epigastric pain/nausea/vomiting/choledocholithiasis on MRCP: Plans are to proceed with an ERCP tomorrow.  2) Large reducible ventral hernia.  3) PCM  4) GERD/Hiatal hernia. 5) HTN/Hyperlipiodemia. 6) Aortic stenosis. 7)  History of breast cancer.  Noretta Frier 12/18/2013, 7:23 PM

## 2013-12-18 NOTE — H&P (Signed)
[facility] History and Physical  Brandy [name] PHK:[id number] DOB: 12/09/1932 DOA: 12/17/2013  Referring physician: EDP PCP: Tawanna Solo, MD   Chief Complaint: Abdominal pain   HPI: Brandy [name] is a 80 y.o. female h/o BRCA, and a single episode of pancreatitis (unknown cause) a "year or two ago" presents to the ED with intermittent abdominal pain.  Pain is associated with N/V, made worse with eating, pain is similar to the pain she experienced in the past with pancreatitis.  She denies fever (subjectively, objectively has 100.5 fever).  Pain onset 1 week ago and has been going on since then (mostly after eating).  Review of Systems: Systems reviewed.  As above, otherwise negative  Past Medical History  Diagnosis Date  . Asthma   . Hyperlipidemia     takes Simvastatin daily  . Hypertension     takes Amlodipine and Diovan daily  . Cancer     left breast  . Bronchitis     hx of;last time >2yrago  . Arthritis     shoulders  . Dysphagia   . H/O hiatal hernia   . GERD (gastroesophageal reflux disease)     takes Prilosec prn  . Urinary urgency   . History of kidney stones     many yrs ago  . Depression     takes Celexa daily  . Insomnia     takes Ambien nightly  . Breast cancer 12/18/11    left breast masectomy=metastatic ca in (1/1) lymph node ,invasive ductal ca,2 foci,,dcis,lymph ovascular invasion identified,surgical resection margins neg for ca,additional tissue=benign skin and subcutaneous tissue  . Breast CA 11/23/11    left breast 3 o'clock bx=high grade ductal ca in situ w/comedononecrosis and calcification,dcis,lymphovascular invasion present ,ER?PR+positive  . Full dentures   . Hx of radiation therapy 04/26/12 -06/10/12    left breast   Past Surgical History  Procedure Laterality Date  . Breast biopsy  1998    left  . Total shoulder replacement  2011    left  . Eus  06/04/2011    Procedure: UPPER ENDOSCOPIC ULTRASOUND (EUS) LINEAR;  Surgeon:  DOwens Loffler MD;  Location: [facility];  Service: Endoscopy;  Laterality: N/A;  . Exploratory laparotomy       biopsy of intra-abdominal mass  . Bladder tack    . Tubal ligation    . Appendectomy    . Dilation and curettage of uterus    . Esophagogastroduodenoscopy    . Mastectomy w/ sentinel node biopsy  12/18/2011    Procedure: MASTECTOMY WITH SENTINEL LYMPH NODE BIOPSY;  Surgeon: MRolm Bookbinder MD;  [place]  Service: General;  Laterality: Left;  . Portacath placement  01/27/2012    Procedure: INSERTION PORT-A-CATH;  Surgeon: MRolm Bookbinder MD;  [place] [facility]  Service: General;  Laterality: Right;  PORT PLACEMENT  . Port-a-cath removal Right 06/15/2013    Procedure: REMOVAL PORT-A-CATH;  Surgeon: MRolm Bookbinder MD;  [place] [facility]  Service: General;  Laterality: Right;   Social History:  reports that she has never smoked. She has never used smokeless tobacco. She reports that she does not drink alcohol or use illicit drugs.  No Known Allergies  Family History  Problem Relation Age of Onset  . Cancer Father     prostate  . Cancer Other     breast ca     Prior to Admission medications   Medication Sig Start Date End Date Taking? Authorizing Provider  amLODipine (NORVASC) 10 MG tablet Take 10 mg by mouth daily.  02/13/11   Historical Provider, MD  Calcium Carbonate-Vitamin D (CALCIUM + D PO) Take by mouth daily.    Minette Headland, NP  citalopram (CELEXA) 20 MG tablet Take 20 mg by mouth daily.    Historical Provider, MD  DIOVAN HCT 160-12.5 MG per tablet Take 2 tablets by mouth every morning.  02/13/11   Historical Provider, MD  ergocalciferol (VITAMIN D2) 50000 UNITS capsule Take 1 capsule (50,000 Units total) by mouth once a week. 06/26/13   Minette Headland, NP  gabapentin (NEURONTIN) 100 MG capsule TAKE 3 CAPSULES AT BEDTIME 06/12/13   Deatra Robinson, MD  letrozole [facility]) 2.5 MG tablet TAKE 1 TABLET (2.5 MG  TOTAL) BY MOUTH DAILY. 07/14/13   Minette Headland, NP  omeprazole (PRILOSEC) 20 MG capsule Take 20 mg by mouth daily.    Historical Provider, MD  oxyCODONE (OXY IR/ROXICODONE) 5 MG immediate release tablet Take 1 tablet (5 mg total) by mouth every 4 (four) hours as needed. 06/08/13   Deatra Robinson, MD  oxyCODONE-acetaminophen (PERCOCET) 10-325 MG per tablet Take 1 tablet by mouth every 6 (six) hours as needed for pain. 06/15/13 06/15/14  Rolm Bookbinder, MD  simvastatin (ZOCOR) 20 MG tablet Take 20 mg by mouth daily.  02/16/11   Historical Provider, MD  spironolactone (ALDACTONE) 25 MG tablet Take 0.5 tablets (12.5 mg total) by mouth daily. 04/19/13   [name] D Ninfa Meeker, NP  zolpidem (AMBIEN) 10 MG tablet Take 10 mg by mouth at bedtime.    Historical Provider, MD   Physical Exam: Filed Vitals:   12/17/13 2345  BP: 147/47  Pulse: 95  Temp: 100.5 F (38.1 C)  Resp: 16    BP 147/47  Pulse 95  Temp(Src) 100.5 F (38.1 C) (Oral)  Resp 16  Ht 5' (1.524 m)  Wt 69.3 kg (152 lb 12.5 oz)  BMI 29.84 kg/m2  SpO2 92%  General Appearance:    Alert, oriented, no distress, appears stated age  Head:    Normocephalic, atraumatic  Eyes:    PERRL, EOMI, sclera non-icteric        Nose:   Nares without drainage or epistaxis. Mucosa, turbinates normal  Throat:   Moist mucous membranes. Oropharynx without erythema or exudate.  Neck:   Supple. No carotid bruits.  No thyromegaly.  No lymphadenopathy.   Back:     No CVA tenderness, no spinal tenderness  Lungs:     Clear to auscultation bilaterally, without wheezes, rhonchi or rales  Chest wall:    No tenderness to palpitation  Heart:    Regular rate and rhythm without murmurs, gallops, rubs  Abdomen:     Soft, non-tender, nondistended, normal bowel sounds, no organomegaly  Genitalia:    deferred  Rectal:    deferred  Extremities:   No clubbing, cyanosis or edema.  Pulses:   2+ and symmetric all extremities  Skin:   Skin color, texture, turgor normal, no  rashes or lesions  Lymph nodes:   Cervical, supraclavicular, and axillary nodes normal  Neurologic:   CNII-XII intact. Normal strength, sensation and reflexes      throughout    Labs on Admission:  Basic Metabolic Panel:  Recent Labs Lab 12/17/13 1805  NA 137  K 3.9  CL 100  CO2 21  GLUCOSE 124*  BUN 23  CREATININE 0.70  CALCIUM 8.8   Liver Function Tests:  Recent Labs Lab 12/17/13 1805  AST 64*  ALT 55*  ALKPHOS 100  BILITOT 1.7*  PROT 7.6  ALBUMIN 3.5    Recent Labs Lab 12/17/13 1805  LIPASE 16   No results found for this basename: AMMONIA,  in the last 168 hours CBC:  Recent Labs Lab 12/17/13 1805  WBC 14.4*  NEUTROABS 11.3*  HGB 12.4  HCT 37.3  MCV 88.4  PLT 303   Cardiac Enzymes:  Recent Labs Lab 12/17/13 1805  TROPONINI <0.30    BNP (last 3 results) No results found for this basename: PROBNP,  in the last 8760 hours CBG: No results found for this basename: GLUCAP,  in the last 168 hours  Radiological Exams on Admission: Ct Abdomen Pelvis Wo Contrast  12/17/2013   CLINICAL DATA:  Intermittent epigastric abdominal pain with nausea and vomiting for 1 week. History of asthma, hypertension and breast cancer. History of kidney stones and pancreatitis.  EXAM: CT ABDOMEN AND PELVIS WITHOUT CONTRAST  TECHNIQUE: Multidetector CT imaging of the abdomen and pelvis was performed following the standard protocol without IV contrast. Study was attempted with intravenous contrast, although IV access was lost.  COMPARISON:  PET-CT 01/04/2012.  Abdominal pelvic CT 09/21/2011.  FINDINGS: The lung bases are clear. There is no pleural or pericardial effusion. Mitral annular calcifications and a moderate size hiatal hernia are noted.  There is moderate intrahepatic and diffuse extrahepatic biliary dilatation. The common bile duct measures up to 2.2 cm in diameter. There are high-density filling defects within the distal common bile duct, best seen on the reformatted  images, suspicious for choledocholithiasis. No pancreatic or ampullary mass is identified. There is no significant dilatation of the pancreatic duct or peripancreatic inflammation. The gallbladder is present and appears normal. No focal liver lesions are seen.  The spleen and adrenal glands appear stable. Both kidneys demonstrate mild cortical thinning with grossly stable cortical and parapelvic cyst formation. There is no hydronephrosis.  There is a large recurrent ventral hernia with left greater than right components. There is herniated small bowel as well as a small portion of the transverse colon. No signs of bowel incarceration or obstruction are seen. There is mild mesenteric edema. No focal extraluminal fluid collections are identified. Sigmoid colon diverticular changes are present.  There is stable aortoiliac atherosclerosis. No ascites or enlarged lymph nodes are seen. There is no evidence of pelvic mass. The ureters and bladder appear normal. There are stable degenerative changes throughout the spine with mild chronic compression deformities in the lower thoracic region. No acute osseous findings or metastases are identified.  IMPRESSION: 1. Progressive intra and extrahepatic biliary dilatation with increased density in the distal common bile duct suspicious for choledocholithiasis. MRCP may be helpful for confirmation and to exclude an ampullary lesion. 2. Recurrent large ventral hernia containing portions of the small bowel and transverse colon. No signs of bowel incarceration or obstruction. 3. No evidence of metastatic disease.   Electronically Signed   By: Camie Patience M.D.   On: 12/17/2013 20:23    EKG: Independently reviewed.  Assessment/Plan Principal Problem:   Choledocholithiasis Active Problems:   Sepsis   1. Choledocholithiasis - as seen on CT scan, suspicious for gall stone disease (especially with pancreatitis in the past), neoplasm is also a possibility for obstructing the CBD  but felt to be less likely.  No masses or evidence of mets from BRCA seen on CT scan. 1. NPO, symptom control with zofran, morphine, IVF to keep hydrated (holding spironolactone) 2. Needs GI consult in  AM re: ERCP? Feel that this would be best to diagnose and mange this (drain CBD) given that the presence of sepsis (fever and leukocytosis) is worrisome for early ascending cholangitis. 3. Mild transaminitis - follow with repeat labs in AM. 4. Given the presence of sepsis, will continue cipro/flagyl at this time.    Code Status: Full Code  Family Communication: Daughter at bedside Disposition Plan: Admit to inpatient   Time spent: 85 min  GARDNER, JARED M. [facility] Pager [phone]  If 7AM-7PM, please contact the day team taking care of the patient [email or web address] Password [email or web address] 12/18/2013, 12:11 AM

## 2013-12-18 NOTE — Progress Notes (Signed)
Patient for MRCP, oral medications held.

## 2013-12-18 NOTE — Progress Notes (Addendum)
PROGRESS NOTE  Brandy [name] PYK:[id number] DOB: 08-Apr-1933 DOA: 12/17/2013 PCP: Tawanna Solo, MD  Summary: 80 year old woman with history of pancreatitis one episode in the past presented to the emergency department with one-week history of nausea, vomiting, pain with eating, low-grade fever.  Assessment/Plan: 1. Suspected choledocholithiasis with abdominal pain, nausea and vomiting. Abdominal pain, nausea and vomiting currently resolved. LFTs somewhat elevated compared to yesterday. 2. Possible early cholangitis with possible sepsis on admission with hypotension. No cultures were obtained on admission. Remains hemodynamically stable at this point. 3. H/o pancreatitis  4. H/o breast cancer in remission 5. Recurrent large ventral hernia without evidence of complicating features. Outpatient referral to general surgery for consideration of elective repair recommended.   Continue n.p.o. status. GI consultation placed this morning, d/w Dr. Collene Mares.  CMP, CBC in the morning.  Obtain cultures though likely to be unrevealing as the patient has received antibiotics.  Further evaluation per GI  Code Status: full code DVT prophylaxis: SCDs until procedure complete or ruled out Family Communication: Daughter at bedside. Disposition Plan: Likely home when improved.  Murray Hodgkins, MD  [facility]  Pager [phone] If 7PM-7AM, please contact night-coverage at [email or web address], password [facility] 12/18/2013, 7:46 AM  LOS: 1 day   Consultants:  Gastroenterology  Procedures:    Antibiotics:  Ciprofloxacin 6/14 >>   Metronidazole 6/14 >>   HPI/Subjective: No abdominal pain, nausea or vomiting.  Objective: Filed Vitals:   12/18/13 0226 12/18/13 0329 12/18/13 0347 12/18/13 0630  BP: 96/41 76/40 100/50 103/50  Pulse: 83   64  Temp: 99 F (37.2 C)   98.1 F (36.7 C)  TempSrc: Oral   Oral  Resp: 16   16  Height:      Weight:      SpO2: 93%   96%    Intake/Output Summary  (Last 24 hours) at 12/18/13 0746 Last data filed at 12/18/13 0600  Gross per 24 hour  Intake 553.33 ml  Output    100 ml  Net 453.33 ml     Filed Weights   12/17/13 1740 12/17/13 2345  Weight: 69.854 kg (154 lb) 69.3 kg (152 lb 12.5 oz)    Exam:   Afebrile, hypotensive early this morning. Vitals currently stable. Gen. Appears calm and comfortable. Nontoxic. Psych. Alert. Grossly normal mood and affect. Speech fluent and appropriate. Cardiovascular. Regular rate and rhythm. 2/6 systolic murmur. No rub or gallop. Respiratory. Clear to auscultation bilaterally. No wheezes, rales or rhonchi. Normal respiratory effort. Abdomen. Soft, nontender, nondistended  Data Reviewed: Chemistry: LFTs slightly higher today including AST, ALT and total bilirubin. Lipase was normal yesterday. Heme: WBC with slight decrease, 13.7 today. Hemoglobin appears to be at baseline, 10.1 ID: No cultures obtained on admission  Scheduled Meds: . amLODipine  10 mg Oral Daily  . ciprofloxacin  400 mg Intravenous Q12H  . citalopram  20 mg Oral Daily  . gabapentin  300 mg Oral QHS  . heparin  5,000 Units Subcutaneous 3 times per day  . irbesartan  300 mg Oral Daily   And  . hydrochlorothiazide  25 mg Oral Daily  . letrozole  2.5 mg Oral Daily  . metronidazole  500 mg Intravenous Q8H  . pantoprazole  40 mg Oral Daily  . simvastatin  20 mg Oral q1800  . zolpidem  5 mg Oral QHS   Continuous Infusions: . sodium chloride 100 mL/hr at 12/18/13 4193    Principal Problem:   Choledocholithiasis Active Problems:   Sepsis   Time  spent 20 minutes

## 2013-12-18 NOTE — Progress Notes (Signed)
Agree with dietetic intern note/assessment. Monitor diet advancement and PO intake. Will add interventions accordingly.  Pryor Ochoa RD, LDN Pager: [phone]

## 2013-12-18 NOTE — Progress Notes (Signed)
INITIAL NUTRITION ASSESSMENT  DOCUMENTATION CODES Per approved criteria  -Severe malnutrition in the context of acute illness or injury   Pt meets criteria for severe MALNUTRITION in the context of acute illness/injury as evidenced by 4.4% weight loss x 10 days and energy intake </= 75% of estimated needs >/= 5 days.   INTERVENTION:  Diet advancement per MD  RD to continue to follow   NUTRITION DIAGNOSIS: Inadequate oral intake related to nausea/vomiting as evidenced by pt report of poor PO intake x 1 week.   Goal: Pt to meet >/=90% of estimated nutrition needs   Monitor:  Diet advancement, weight trend, labs   Reason for Assessment: Positive Malnutrition Screening Tool Score   80 y.o. female  Admitting Dx: Choledocholithiasis  ASSESSMENT: Patient is a 80 y.o. female h/o BRCA, and a single episode of pancreatitis (unknown cause) a "year or two ago" presents to the ED with intermittent abdominal pain. Pain is associated with N/V, made worse with eating, pain is similar to the pain she experienced in the past with pancreatitis. Pain onset 1 week ago and has been going on since then (mostly after eating).  Pt has been vomiting everything she eats for 1 week. Prior to this, pt was eating well at home. She reported that she does not eat much as a baseline. She eats a general healthy diet.   Pt states her usual weight to be between 150 - 160 lbs. Pt has weight loss of 4.4% x 10 days, which is severe for time frame.  No muscle or subcutaneous fat depletion noticed.   Elevated glucose   Height: Ht Readings from Last 1 Encounters:  12/17/13 5' (1.524 m)    Weight: Wt Readings from Last 1 Encounters:  12/17/13 152 lb 12.5 oz (69.3 kg)    Ideal Body Weight: 100 lbs   % Ideal Body Weight: 150%   Wt Readings from Last 10 Encounters:  12/17/13 152 lb 12.5 oz (69.3 kg)  12/07/13 159 lb (72.122 kg)  07/25/13 158 lb (71.668 kg)  06/15/13 168 lb 8 oz (76.431 kg)  06/15/13 168  lb 8 oz (76.431 kg)  06/08/13 164 lb 1.6 oz (74.435 kg)  02/08/13 161 lb 9.6 oz (73.301 kg)  01/18/13 157 lb 6.4 oz (71.396 kg)  01/16/13 159 lb (72.122 kg)  12/28/12 158 lb 1.6 oz (71.714 kg)    Usual Body Weight: 150 - 160 lbs   % Usual Body Weight: 100%   BMI:  Body mass index is 29.84 kg/(m^2)., Overweight   Estimated Nutritional Needs: Kcal: 1700 - 1900  Protein: 65 -75 grams  Fluid: >/= 1.7 L/day   Skin: intact   Diet Order: NPO  EDUCATION NEEDS: -No education needs identified at this time   Intake/Output Summary (Last 24 hours) at 12/18/13 1448 Last data filed at 12/18/13 0815  Gross per 24 hour  Intake 553.33 ml  Output    300 ml  Net 253.33 ml    Last BM: 6/14    Labs:   Recent Labs Lab 12/17/13 1805 12/18/13 0736  NA 137 138  K 3.9 4.1  CL 100 104  CO2 21 23  BUN 23 24*  CREATININE 0.70 0.97  CALCIUM 8.8 7.6*  GLUCOSE 124* 135*    CBG (last 3)  No results found for this basename: GLUCAP,  in the last 72 hours  Scheduled Meds: . amLODipine  10 mg Oral Daily  . ciprofloxacin  400 mg Intravenous Q12H  . citalopram  20 mg Oral Daily  . gabapentin  300 mg Oral QHS  . irbesartan  300 mg Oral Daily   And  . hydrochlorothiazide  25 mg Oral Daily  . letrozole  2.5 mg Oral Daily  . metronidazole  500 mg Intravenous Q8H  . pantoprazole  40 mg Oral Daily  . simvastatin  20 mg Oral q1800  . zolpidem  5 mg Oral QHS    Continuous Infusions: . sodium chloride 100 mL/hr at 12/18/13 3244    Past Medical History  Diagnosis Date  . Asthma   . Hyperlipidemia     takes Simvastatin daily  . Hypertension     takes Amlodipine and Diovan daily  . Cancer     left breast  . Bronchitis     hx of;last time >56yrago  . Arthritis     shoulders  . Dysphagia   . H/O hiatal hernia   . GERD (gastroesophageal reflux disease)     takes Prilosec prn  . Urinary urgency   . History of kidney stones     many yrs ago  . Depression     takes Celexa daily   . Insomnia     takes Ambien nightly  . Breast cancer 12/18/11    left breast masectomy=metastatic ca in (1/1) lymph node ,invasive ductal ca,2 foci,,dcis,lymph ovascular invasion identified,surgical resection margins neg for ca,additional tissue=benign skin and subcutaneous tissue  . Breast CA 11/23/11    left breast 3 o'clock bx=high grade ductal ca in situ w/comedononecrosis and calcification,dcis,lymphovascular invasion present ,ER?PR+positive  . Full dentures   . Hx of radiation therapy 04/26/12 -06/10/12    left breast    Past Surgical History  Procedure Laterality Date  . Breast biopsy  1998    left  . Total shoulder replacement  2011    left  . Eus  06/04/2011    Procedure: UPPER ENDOSCOPIC ULTRASOUND (EUS) LINEAR;  Surgeon: DOwens Loffler MD;  Location: [facility];  Service: Endoscopy;  Laterality: N/A;  . Exploratory laparotomy       biopsy of intra-abdominal mass  . Bladder tack    . Tubal ligation    . Appendectomy    . Dilation and curettage of uterus    . Esophagogastroduodenoscopy    . Mastectomy w/ sentinel node biopsy  12/18/2011    Procedure: MASTECTOMY WITH SENTINEL LYMPH NODE BIOPSY;  Surgeon: MRolm Bookbinder MD;  [facility]  Service: General;  Laterality: Left;  . Portacath placement  01/27/2012    Procedure: INSERTION PORT-A-CATH;  Surgeon: MRolm Bookbinder MD;  [place] [facility]  Service: General;  Laterality: Right;  PORT PLACEMENT  . Port-a-cath removal Right 06/15/2013    Procedure: REMOVAL PORT-A-CATH;  Surgeon: MRolm Bookbinder MD;  [place] [facility]  Service: General;  Laterality: Right;    RCarrolyn Leigh BS Dietetic Intern Pager: [phone]

## 2013-12-19 ENCOUNTER — Encounter (HOSPITAL_COMMUNITY): Payer: Self-pay | Admitting: Gastroenterology

## 2013-12-19 ENCOUNTER — Encounter (HOSPITAL_COMMUNITY): Payer: Medicare HMO | Admitting: Certified Registered Nurse Anesthetist

## 2013-12-19 ENCOUNTER — Inpatient Hospital Stay (HOSPITAL_COMMUNITY): Payer: Medicare HMO | Admitting: Certified Registered Nurse Anesthetist

## 2013-12-19 ENCOUNTER — Encounter (HOSPITAL_COMMUNITY)
Admission: EM | Disposition: A | Discharge status: Home / Self Care | Payer: Self-pay | Source: Home / Self Care | Attending: Internal Medicine

## 2013-12-19 DIAGNOSIS — I10 Essential (primary) hypertension: Secondary | ICD-10-CM

## 2013-12-19 HISTORY — PX: ESOPHAGOGASTRODUODENOSCOPY (EGD) WITH PROPOFOL: SHX5813

## 2013-12-19 LAB — CBC
HCT: 30 % — ABNORMAL LOW (ref 36.0–46.0)
Hemoglobin: 9.7 g/dL — ABNORMAL LOW (ref 12.0–15.0)
MCH: 28.4 pg (ref 26.0–34.0)
MCHC: 32.3 g/dL (ref 30.0–36.0)
MCV: 87.7 fL (ref 78.0–100.0)
PLATELETS: 235 10*3/uL (ref 150–400)
RBC: 3.42 MIL/uL — ABNORMAL LOW (ref 3.87–5.11)
RDW: 14.4 % (ref 11.5–15.5)
WBC: 8.6 10*3/uL (ref 4.0–10.5)

## 2013-12-19 LAB — APTT: APTT: 33 s (ref 24–37)

## 2013-12-19 LAB — COMPREHENSIVE METABOLIC PANEL
ALT: 48 U/L — AB (ref 0–35)
AST: 30 U/L (ref 0–37)
Albumin: 2.5 g/dL — ABNORMAL LOW (ref 3.5–5.2)
Alkaline Phosphatase: 101 U/L (ref 39–117)
BILIRUBIN TOTAL: 0.6 mg/dL (ref 0.3–1.2)
BUN: 13 mg/dL (ref 6–23)
CO2: 21 meq/L (ref 19–32)
Calcium: 7.4 mg/dL — ABNORMAL LOW (ref 8.4–10.5)
Chloride: 107 mEq/L (ref 96–112)
Creatinine, Ser: 0.61 mg/dL (ref 0.50–1.10)
GFR calc Af Amer: >90 mL/min (ref >90)
GFR, EST NON AFRICAN AMERICAN: 85 mL/min — AB (ref >90)
GLUCOSE: 96 mg/dL (ref 70–99)
Potassium: 4 mEq/L (ref 3.7–5.3)
SODIUM: 140 meq/L (ref 137–147)
Total Protein: 6.1 g/dL (ref 6.0–8.3)

## 2013-12-19 LAB — PROTIME-INR
INR: 1.15 (ref 0.00–1.49)
PROTHROMBIN TIME: 14.5 s (ref 11.6–15.2)

## 2013-12-19 SURGERY — ESOPHAGOGASTRODUODENOSCOPY (EGD) WITH PROPOFOL
Laterality: Left

## 2013-12-19 MED ORDER — LACTATED RINGERS IV SOLN
INTRAVENOUS | Status: DC
  Administered 2013-12-19: 1000 mL via INTRAVENOUS

## 2013-12-19 MED ORDER — EPHEDRINE SULFATE 50 MG/ML IJ SOLN
INTRAMUSCULAR | Status: DC | PRN
  Administered 2013-12-19 (×2): 5 mg via INTRAVENOUS

## 2013-12-19 MED ORDER — SUCCINYLCHOLINE CHLORIDE 20 MG/ML IJ SOLN
INTRAMUSCULAR | Status: DC | PRN
  Administered 2013-12-19: 100 mg via INTRAVENOUS

## 2013-12-19 MED ORDER — LACTATED RINGERS IV SOLN
INTRAVENOUS | Status: DC | PRN
  Administered 2013-12-19 (×2): via INTRAVENOUS

## 2013-12-19 MED ORDER — PROPOFOL 10 MG/ML IV BOLUS
INTRAVENOUS | Status: DC | PRN
  Administered 2013-12-19: 80 mg via INTRAVENOUS

## 2013-12-19 MED ORDER — LIDOCAINE HCL (CARDIAC) 20 MG/ML IV SOLN
INTRAVENOUS | Status: DC | PRN
  Administered 2013-12-19: 25 mg via INTRAVENOUS

## 2013-12-19 NOTE — Anesthesia Preprocedure Evaluation (Addendum)
Anesthesia Evaluation  Patient identified by MRN, date of birth, ID band Patient awake    Reviewed: Allergy & Precautions, H&P , NPO status , Patient's Chart, lab work & pertinent test results  History of Anesthesia Complications Negative for: history of anesthetic complications  Airway Mallampati: I TM Distance: >3 FB Neck ROM: Full    Dental  (+) Edentulous Upper, Edentulous Lower   Pulmonary neg pulmonary ROS,  breath sounds clear to auscultation  Pulmonary exam normal       Cardiovascular hypertension, Pt. on medications Rhythm:Regular Rate:Normal  '14 ECHO: EF 60-65%, mild AS    Neuro/Psych negative neurological ROS     GI/Hepatic GERD-  Medicated and Controlled,Elevated LFTs   Endo/Other  negative endocrine ROS  Renal/GU negative Renal ROS     Musculoskeletal   Abdominal   Peds  Hematology  (+) Blood dyscrasia (Hb 9.7), anemia ,   Anesthesia Other Findings Breast cancer  Reproductive/Obstetrics                         Anesthesia Physical Anesthesia Plan  ASA: III  Anesthesia Plan: General/Spinal   Post-op Pain Management:    Induction: Intravenous  Airway Management Planned: Oral ETT  Additional Equipment:   Intra-op Plan:   Post-operative Plan: Extubation in OR  Informed Consent: I have reviewed the patients History and Physical, chart, labs and discussed the procedure including the risks, benefits and alternatives for the proposed anesthesia with the patient or authorized representative who has indicated his/her understanding and acceptance.     Plan Discussed with: CRNA and Surgeon  Anesthesia Plan Comments: (Plan routine monitors, GETA)        Anesthesia Quick Evaluation

## 2013-12-19 NOTE — Progress Notes (Signed)
PROGRESS NOTE  Brandy [name] IHK:[id number] DOB: 1933/06/16 DOA: 12/17/2013 PCP: Tawanna Solo, MD  Summary: 80 year old woman with history of pancreatitis one episode in the past presented to the emergency department with one-week history of nausea, vomiting, pain with eating, low-grade fever.  Assessment/Plan: 1. Strictured ampula. She underwent ERCP on 12/19/2013, procedure performed by Dr Benson Norway, found to have strictured ampula. Dr Benson Norway recommending IR consult for percutaneous drainage and evaluation of sphincteroplasty.  2. Possible early cholangitis with possible sepsis on admission with hypotension. She is on Cipro and Flagyl, white count trending down from 14,000 in 6/14 to 8,600 on this morning's labs.She remains hemodynamically stable.  3. H/o pancreatitis  4. H/o breast cancer in remission 5. Recurrent large ventral hernia without evidence of complicating features. Outpatient referral to general surgery for consideration of elective repair recommended.   CMP, CBC in the morning.  Obtain cultures though likely to be unrevealing as the patient has received antibiotics.  Code Status: full code DVT prophylaxis: SCDs until procedure complete or ruled out Family Communication: Spoke with husband Disposition Plan: Likely home when improved.  Murray Hodgkins, MD  [facility]  Pager [phone] If 7PM-7AM, please contact night-coverage at [email or web address], password [facility] 12/19/2013, 3:49 PM  LOS: 2 days   Consultants:  Gastroenterology  Procedures:    Antibiotics:  Ciprofloxacin 6/14 >>   Metronidazole 6/14 >>   HPI/Subjective: Patient seen post procedure, she is in no acute distress  Objective: Filed Vitals:   12/19/13 1525 12/19/13 1530 12/19/13 1535 12/19/13 1540  BP:  137/34  143/41  Pulse: 68 65 63 65  Temp:      TempSrc:      Resp: 22 18 19 22   Height:      Weight:      SpO2: 100% 99% 100% 99%    Intake/Output Summary (Last 24 hours) at 12/19/13  1549 Last data filed at 12/19/13 1442  Gross per 24 hour  Intake   3046 ml  Output   2025 ml  Net   1021 ml     Filed Weights   12/17/13 1740 12/17/13 2345  Weight: 69.854 kg (154 lb) 69.3 kg (152 lb 12.5 oz)    Exam:   Afebrile, hypotensive early this morning. Vitals currently stable. Gen. Appears calm and comfortable. Nontoxic. Psych. Alert. Grossly normal mood and affect. Speech fluent and appropriate. Cardiovascular. Regular rate and rhythm. 2/6 systolic murmur. No rub or gallop. Respiratory. Clear to auscultation bilaterally. No wheezes, rales or rhonchi. Normal respiratory effort. Abdomen. Soft, nontender, nondistended  Data Reviewed: Chemistry: LFTs slightly higher today including AST, ALT and total bilirubin. Lipase was normal yesterday. Heme: WBC with slight decrease, 13.7 today. Hemoglobin appears to be at baseline, 10.1 ID: No cultures obtained on admission  Scheduled Meds: . [MAR HOLD] amLODipine  10 mg Oral Daily  . [facility] HOLD] ciprofloxacin  400 mg Intravenous Q12H  . [facility] HOLD] citalopram  20 mg Oral Daily  . [facility] HOLD] gabapentin  300 mg Oral QHS  . [MAR HOLD] irbesartan  300 mg Oral Daily   And  . [MAR HOLD] hydrochlorothiazide  25 mg Oral Daily  . [facility] HOLD] letrozole  2.5 mg Oral Daily  . [facility] HOLD] metronidazole  500 mg Intravenous Q8H  . [MAR HOLD] pantoprazole  40 mg Oral Daily  . [facility] HOLD] simvastatin  20 mg Oral q1800  . [facility] HOLD] zolpidem  5 mg Oral QHS   Continuous Infusions: . sodium chloride 100 mL/hr at 12/18/13 2146  .  lactated ringers 1,000 mL (12/19/13 1150)    Principal Problem:   Choledocholithiasis Active Problems:   Sepsis   Protein-calorie malnutrition, severe   Time spent 25 minutes

## 2013-12-19 NOTE — H&P (View-Only) (Signed)
Unassigned patient Reason for Consult: Choledocholithiasis on MRCP. Referring Physician: THP.  Brandy [name] is an 80 y.o. female.  HPI: 80 year old white female, with multiple medical issues listed below, presented to the ER yesterday with a 1 week history of severe epigastric pain and nausea with vomiting. She rates the pain at a 10/10 when it is at its maximum intensity. She has lost about 10 lbs through all of this. She had a bout of pancreatitis in 2012 when she had an EUS performed by Dr. Ranelle Oyster and a possible mass in the the head of the pancreas but repeated biopsies revealed no evidence of malignancy. She also had a ?omental or a retroperitoneal mass biopsied in 2011 that was thought to be fat necrosis with omental tissue but again no malignancy was identified. She had an MRCP that revealed diffuse dilated ducts with a stricture in the intrapancreatic portion of the duct. She was also noted to have 2 large stone, 10 mm and 11 mm in the distal CBD.    Past Medical History  Diagnosis Date  . Asthma   . Hyperlipidemia     takes Simvastatin daily  . Hypertension     takes Amlodipine and Diovan daily  . Cancer     left breast  . Bronchitis     hx of;last time >49yrago  . Arthritis     shoulders  . Dysphagia   . H/O hiatal hernia   . GERD (gastroesophageal reflux disease)     takes Prilosec prn  . Urinary urgency   . History of kidney stones     many yrs ago  . Depression     takes Celexa daily  . Insomnia     takes Ambien nightly  . Breast cancer 12/18/11    left breast masectomy=metastatic ca in (1/1) lymph node ,invasive ductal ca,2 foci,,dcis,lymph ovascular invasion identified,surgical resection margins neg for ca,additional tissue=benign skin and subcutaneous tissue  . Breast CA 11/23/11    left breast 3 o'clock bx=high grade ductal ca in situ w/comedononecrosis and calcification,dcis,lymphovascular invasion present ,ER?PR+positive  . Full dentures   . Hx of  radiation therapy 04/26/12 -06/10/12    left breast   Past Surgical History  Procedure Laterality Date  . Breast biopsy  1998    left  . Total shoulder replacement  2011    left  . Eus  06/04/2011    Procedure: UPPER ENDOSCOPIC ULTRASOUND (EUS) LINEAR;  Surgeon: DOwens Loffler MD;  Location: [facility];  Service: Endoscopy;  Laterality: N/A;  . Exploratory laparotomy       biopsy of intra-abdominal mass  . Bladder tack    . Tubal ligation    . Appendectomy    . Dilation and curettage of uterus    . Endoscopic ultrasound with FNA 2012    . Mastectomy w/ sentinel node biopsy  12/18/2011    Procedure: MASTECTOMY WITH SENTINEL LYMPH NODE BIOPSY;  Surgeon: MRolm Bookbinder MD;  [place] [facility]  Service: General;  Laterality: Left;  . Portacath placement  01/27/2012    Procedure: INSERTION PORT-A-CATH;  Surgeon: MRolm Bookbinder MD;  [place] [facility]  Service: General;  Laterality: Right;  PORT PLACEMENT  . Port-a-cath removal Right 06/15/2013    Procedure: REMOVAL PORT-A-CATH;  Surgeon: MRolm Bookbinder MD;  [place] [facility]  Service: General;  Laterality: Right;   Family History  Problem Relation Age of Onset  . Cancer Father     prostate  .  Cancer Other     breast ca   Social History:  reports that she has never smoked. She has never used smokeless tobacco. She reports that she does not drink alcohol or use illicit drugs.  Allergies: No Known Allergies  Medications: I have reviewed the patient's current medications.  Results for orders placed during the hospital encounter of 12/17/13 (from the past 48 hour(s))  TROPONIN I     Status: None   Collection Time    12/17/13  6:05 PM      Result Value Ref Range   Troponin I <0.30  <0.30 ng/mL   Comment:            Due to the release kinetics of cTnI,     a negative result within the first hours     of the onset of symptoms does not rule out     myocardial infarction with certainty.       If myocardial infarction is still suspected,     repeat the test at appropriate intervals.  CBC WITH DIFFERENTIAL     Status: Abnormal   Collection Time    12/17/13  6:05 PM      Result Value Ref Range   WBC 14.4 (*) 4.0 - 10.5 K/uL   Comment: WHITE COUNT CONFIRMED ON SMEAR   RBC 4.22  3.87 - 5.11 MIL/uL   Hemoglobin 12.4  12.0 - 15.0 g/dL   HCT 37.3  36.0 - 46.0 %   MCV 88.4  78.0 - 100.0 fL   MCH 29.4  26.0 - 34.0 pg   MCHC 33.2  30.0 - 36.0 g/dL   RDW 14.2  11.5 - 15.5 %   Platelets 303  150 - 400 K/uL   Comment: PLATELET COUNT CONFIRMED BY SMEAR   Neutrophils Relative % 79 (*) 43 - 77 %   Neutro Abs 11.3 (*) 1.7 - 7.7 K/uL   Lymphocytes Relative 15  12 - 46 %   Lymphs Abs 2.2  0.7 - 4.0 K/uL   Monocytes Relative 5  3 - 12 %   Monocytes Absolute 0.8  0.1 - 1.0 K/uL   Eosinophils Relative 1  0 - 5 %   Eosinophils Absolute 0.1  0.0 - 0.7 K/uL   Basophils Relative 0  0 - 1 %   Basophils Absolute 0.0  0.0 - 0.1 K/uL   WBC Morphology TOXIC GRANULATION    COMPREHENSIVE METABOLIC PANEL     Status: Abnormal   Collection Time    12/17/13  6:05 PM      Result Value Ref Range   Sodium 137  137 - 147 mEq/L   Potassium 3.9  3.7 - 5.3 mEq/L   Chloride 100  96 - 112 mEq/L   CO2 21  19 - 32 mEq/L   Glucose, Bld 124 (*) 70 - 99 mg/dL   BUN 23  6 - 23 mg/dL   Creatinine, Ser 0.70  0.50 - 1.10 mg/dL   Calcium 8.8  8.4 - 10.5 mg/dL   Total Protein 7.6  6.0 - 8.3 g/dL   Albumin 3.5  3.5 - 5.2 g/dL   AST 64 (*) 0 - 37 U/L   ALT 55 (*) 0 - 35 U/L   Alkaline Phosphatase 100  39 - 117 U/L   Total Bilirubin 1.7 (*) 0.3 - 1.2 mg/dL   GFR calc non Af Amer 81 (*) >90 mL/min   GFR calc Af Amer >90  >90 mL/min   Comment: (  NOTE)     The eGFR has been calculated using the CKD EPI equation.     This calculation has not been validated in all clinical situations.     eGFR's persistently <90 mL/min signify possible Chronic Kidney     Disease.  LIPASE, BLOOD     Status: None   Collection Time     12/17/13  6:05 PM      Result Value Ref Range   Lipase 16  11 - 59 U/L  CBC     Status: Abnormal   Collection Time    12/18/13  7:36 AM      Result Value Ref Range   WBC 13.7 (*) 4.0 - 10.5 K/uL   RBC 3.52 (*) 3.87 - 5.11 MIL/uL   Hemoglobin 10.1 (*) 12.0 - 15.0 g/dL   HCT 30.7 (*) 36.0 - 46.0 %   MCV 87.2  78.0 - 100.0 fL   MCH 28.7  26.0 - 34.0 pg   MCHC 32.9  30.0 - 36.0 g/dL   RDW 14.4  11.5 - 15.5 %   Platelets 234  150 - 400 K/uL  COMPREHENSIVE METABOLIC PANEL     Status: Abnormal   Collection Time    12/18/13  7:36 AM      Result Value Ref Range   Sodium 138  137 - 147 mEq/L   Potassium 4.1  3.7 - 5.3 mEq/L   Chloride 104  96 - 112 mEq/L   CO2 23  19 - 32 mEq/L   Glucose, Bld 135 (*) 70 - 99 mg/dL   BUN 24 (*) 6 - 23 mg/dL   Creatinine, Ser 0.97  0.50 - 1.10 mg/dL   Calcium 7.6 (*) 8.4 - 10.5 mg/dL   Total Protein 6.0  6.0 - 8.3 g/dL   Albumin 2.6 (*) 3.5 - 5.2 g/dL   AST 71 (*) 0 - 37 U/L   ALT 69 (*) 0 - 35 U/L   Alkaline Phosphatase 108  39 - 117 U/L   Total Bilirubin 2.1 (*) 0.3 - 1.2 mg/dL   GFR calc non Af Amer 55 (*) >90 mL/min   GFR calc Af Amer 63 (*) >90 mL/min   Comment: (NOTE)     The eGFR has been calculated using the CKD EPI equation.     This calculation has not been validated in all clinical situations.     eGFR's persistently <90 mL/min signify possible Chronic Kidney     Disease.   Ct Abdomen Pelvis Wo Contrast  12/17/2013   CLINICAL DATA:  Intermittent epigastric abdominal pain with nausea and vomiting for 1 week. History of asthma, hypertension and breast cancer. History of kidney stones and pancreatitis.  EXAM: CT ABDOMEN AND PELVIS WITHOUT CONTRAST  TECHNIQUE: Multidetector CT imaging of the abdomen and pelvis was performed following the standard protocol without IV contrast. Study was attempted with intravenous contrast, although IV access was lost.  COMPARISON:  PET-CT 01/04/2012.  Abdominal pelvic CT 09/21/2011.  FINDINGS: The lung bases  are clear. There is no pleural or pericardial effusion. Mitral annular calcifications and a moderate size hiatal hernia are noted.  There is moderate intrahepatic and diffuse extrahepatic biliary dilatation. The common bile duct measures up to 2.2 cm in diameter. There are high-density filling defects within the distal common bile duct, best seen on the reformatted images, suspicious for choledocholithiasis. No pancreatic or ampullary mass is identified. There is no significant dilatation of the pancreatic duct or peripancreatic inflammation. The gallbladder is   present and appears normal. No focal liver lesions are seen.  The spleen and adrenal glands appear stable. Both kidneys demonstrate mild cortical thinning with grossly stable cortical and parapelvic cyst formation. There is no hydronephrosis.  There is a large recurrent ventral hernia with left greater than right components. There is herniated small bowel as well as a small portion of the transverse colon. No signs of bowel incarceration or obstruction are seen. There is mild mesenteric edema. No focal extraluminal fluid collections are identified. Sigmoid colon diverticular changes are present.  There is stable aortoiliac atherosclerosis. No ascites or enlarged lymph nodes are seen. There is no evidence of pelvic mass. The ureters and bladder appear normal. There are stable degenerative changes throughout the spine with mild chronic compression deformities in the lower thoracic region. No acute osseous findings or metastases are identified.  IMPRESSION: 1. Progressive intra and extrahepatic biliary dilatation with increased density in the distal common bile duct suspicious for choledocholithiasis. MRCP may be helpful for confirmation and to exclude an ampullary lesion. 2. Recurrent large ventral hernia containing portions of the small bowel and transverse colon. No signs of bowel incarceration or obstruction. 3. No evidence of metastatic disease.    Electronically Signed   By: Camie Patience M.D.   On: 12/17/2013 20:23   Mr Abdomen Mrcp Wo Cm  12/18/2013   CLINICAL DATA:  Biliary ductal dilatation. Nausea and vomiting. Personal history of pancreatitis, breast carcinoma, and lymphoma.  EXAM: MRI ABDOMEN WITHOUT  (INCLUDING MRCP)  TECHNIQUE: Multiplanar multisequence MR imaging of the abdomen was performed. Heavily T2-weighted images of the biliary and pancreatic ducts were obtained, and three-dimensional MRCP images were rendered by post processing.  COMPARISON:  Noncontrast CT on 12/17/2013  FINDINGS: Diffuse biliary ductal dilatation seen, with proximal common bile duct measuring 17 mm in diameter. A smooth stricture is seen involving the intrapancreatic portion of the distal common bile duct which has a benign appearance. No pancreatic mass visualized on this noncontrast study and there is no evidence of pancreatic ductal dilatation. At least 2 large calculi are seen in the distal common bile duct measuring approximately 10 mm and 11 mm in diameter. Gallbladder is unremarkable in appearance.  Noncontrast images of the liver, spleen, adrenal glands are normal in appearance. Small bilateral renal parenchymal cysts and left sided parapelvic cysts are noted, without definite evidence of renal mass or hydronephrosis on this noncontrast study.  Small hiatal hernia noted. No evidence of abdominal lymphadenopathy. No other inflammatory process or abnormal fluid collections seen within the abdomen. Visualized abdominal bowel loops are nondilated.  IMPRESSION: Diffuse biliary ductal dilatation, with choledocholithiasis and benign appearing distal common bile duct stricture.  No evidence of pancreatic mass or pancreatic ductal dilatation.   Electronically Signed   By: Earle Gell M.D.   On: 12/18/2013 13:08   Review of Systems  Constitutional: Positive for fever, weight loss, malaise/fatigue and diaphoresis. Negative for chills.  HENT: Negative.   Respiratory:  Positive for shortness of breath. Negative for cough, hemoptysis, sputum production and wheezing.   Cardiovascular: Negative for chest pain, palpitations, orthopnea, claudication, leg swelling and PND.  Gastrointestinal: Positive for heartburn, nausea, vomiting and abdominal pain. Negative for blood in stool and melena.  Genitourinary: Positive for urgency. Negative for dysuria, frequency, hematuria and flank pain.  Musculoskeletal: Positive for back pain and joint pain.  Skin: Negative.   Neurological: Positive for weakness.  Endo/Heme/Allergies: Negative.   Psychiatric/Behavioral: Positive for depression. Negative for suicidal ideas, hallucinations, memory loss  and substance abuse. The patient is nervous/anxious and has insomnia.    Blood pressure 103/50, pulse 80, temperature 98 F (36.7 C), temperature source Oral, resp. rate 16, height 5' (1.524 m), weight 69.3 kg (152 lb 12.5 oz), SpO2 99.00%. Physical Exam  Constitutional: She is oriented to person, place, and time. She appears well-developed and well-nourished.  HENT:  Head: Normocephalic and atraumatic.  Eyes: Conjunctivae and EOM are normal. Pupils are equal, round, and reactive to light.  Neck: Normal range of motion. Neck supple.  Cardiovascular: Normal rate and regular rhythm.   Respiratory: Effort normal and breath sounds normal.  GI: Soft. Bowel sounds are normal.    Large reducible ventral hernia noted to the left of the umbilicus  Musculoskeletal: Normal range of motion.  Neurological: She is alert and oriented to person, place, and time.  Skin: Skin is warm and dry.  Psychiatric: She has a normal mood and affect. Her behavior is normal. Judgment and thought content normal.   Assessment/Plan: 1) Epigastric pain/nausea/vomiting/choledocholithiasis on MRCP: Plans are to proceed with an ERCP tomorrow.  2) Large reducible ventral hernia.  3) PCM  4) GERD/Hiatal hernia. 5) HTN/Hyperlipiodemia. 6) Aortic stenosis. 7)  History of breast cancer.  Scarlet Abad 12/18/2013, 7:23 PM

## 2013-12-19 NOTE — Op Note (Signed)
[facility] [place], [id number]   ERCP PROCEDURE REPORT  PATIENT: Brandy [name], Brandy [name].  MR# :[id number] BIRTHDATE: [birth year]  GENDER: Female ENDOSCOPIST: Carol Ada, MD REFERRED BY: PROCEDURE DATE:  12/19/2013 PROCEDURE:   ERCP, diagnostic ASA CLASS:    III INDICATIONS: Choledocholithiasis MEDICATIONS:    General Anesthesia TOPICAL ANESTHETIC:  None  DESCRIPTION OF PROCEDURE:   After the risks benefits and alternatives of the procedure were thoroughly explained, informed consent was obtained.  The     endoscope was introduced through the mouth and advanced to the second portion of the duodenum. Advancing the scope to the through the pylorus was difficult as there was a sharp angulation of the antrum.  Ultimately the duodenoscope was maneuvered through the area.  The ampulla was very difficult to find, but once identified cannulation failed.  I used both the .035 and the .025 sphinctertomes without any success.  The ampulla was very tightly strictured.  No spontaneous drainage of bile was noted.  After these failed attempts the procedure was terminated.       COMPLICATIONS:   None  ENDOSCOPIC IMPRESSION: 1) Strictured ampulla.  RECOMMENDATIONS: 1) IR consult for percutaneously drainage and possible sphincteroplasty.    _______________________________ eSignedCarol Ada, MD 12/19/2013 3:07 PM   CC:

## 2013-12-19 NOTE — Anesthesia Postprocedure Evaluation (Signed)
  Anesthesia Post-op Note  Patient: Brandy [name]  Procedure(s) Performed: Procedure(s): ENDOSCOPIC RETROGRADE CHOLANGIOPANCREATOGRAPHY (ERCP) (Left)  Patient Location: Endoscopy Unit  Anesthesia Type:General  Level of Consciousness: awake, alert , oriented and patient cooperative  Airway and Oxygen Therapy: Patient Spontanous Breathing  Post-op Pain: none  Post-op Assessment: Post-op Vital signs reviewed, Patient's Cardiovascular Status Stable, Respiratory Function Stable, Patent Airway, No signs of Nausea or vomiting and Pain level controlled  Post-op Vital Signs: Reviewed and stable  Last Vitals:  Filed Vitals:   12/19/13 1540  BP: 143/41  Pulse: 65  Temp:   Resp: 22    Complications: No apparent anesthesia complications

## 2013-12-19 NOTE — Transfer of Care (Signed)
Immediate Anesthesia Transfer of Care Note  Patient: Brandy [name]  Procedure(s) Performed: Procedure(s): ENDOSCOPIC RETROGRADE CHOLANGIOPANCREATOGRAPHY (ERCP) (Left)  Patient Location: PACU and Endoscopy Unit  Anesthesia Type:General  Level of Consciousness: awake, alert , oriented, patient cooperative and responds to stimulation  Airway & Oxygen Therapy: Patient Spontanous Breathing and Patient connected to nasal cannula oxygen  Post-op Assessment: Report given to PACU RN, Post -op Vital signs reviewed and stable and Patient moving all extremities X 4  Post vital signs: Reviewed and stable  Complications: No apparent anesthesia complications

## 2013-12-19 NOTE — Interval H&P Note (Signed)
History and Physical Interval Note:  12/19/2013 1:44 PM  Brandy [name]  has presented today for surgery, with the diagnosis of Choledocholithiasis with dialated CBD.  The various methods of treatment have been discussed with the patient and family. After consideration of risks, benefits and other options for treatment, the patient has consented to  Procedure(s): ENDOSCOPIC RETROGRADE CHOLANGIOPANCREATOGRAPHY (ERCP) (Left) as a surgical intervention .  The patient's history has been reviewed, patient examined, no change in status, stable for surgery.  I have reviewed the patient's chart and labs.  Questions were answered to the patient's satisfaction.     Brandy [name] D

## 2013-12-20 ENCOUNTER — Inpatient Hospital Stay (HOSPITAL_COMMUNITY): Payer: Medicare HMO

## 2013-12-20 ENCOUNTER — Encounter (HOSPITAL_COMMUNITY): Payer: Self-pay | Admitting: Gastroenterology

## 2013-12-20 DIAGNOSIS — K219 Gastro-esophageal reflux disease without esophagitis: Secondary | ICD-10-CM

## 2013-12-20 DIAGNOSIS — R109 Unspecified abdominal pain: Secondary | ICD-10-CM

## 2013-12-20 MED ORDER — HYDROCODONE-ACETAMINOPHEN 5-325 MG PO TABS
1.0000 | ORAL_TABLET | ORAL | Status: DC | PRN
  Administered 2013-12-20: 1 via ORAL
  Administered 2013-12-21 – 2013-12-22 (×4): 2 via ORAL
  Filled 2013-12-20: qty 2
  Filled 2013-12-20: qty 1
  Filled 2013-12-20 (×3): qty 2

## 2013-12-20 MED ORDER — IOHEXOL 300 MG/ML  SOLN
50.0000 mL | Freq: Once | INTRAMUSCULAR | Status: AC | PRN
  Administered 2013-12-20: 20 mL

## 2013-12-20 MED ORDER — FENTANYL CITRATE 0.05 MG/ML IJ SOLN
INTRAMUSCULAR | Status: AC
  Filled 2013-12-20: qty 2

## 2013-12-20 MED ORDER — FENTANYL CITRATE 0.05 MG/ML IJ SOLN
INTRAMUSCULAR | Status: AC | PRN
  Administered 2013-12-20: 12.5 ug via INTRAVENOUS
  Administered 2013-12-20 (×3): 25 ug via INTRAVENOUS

## 2013-12-20 MED ORDER — MIDAZOLAM HCL 2 MG/2ML IJ SOLN
INTRAMUSCULAR | Status: AC
  Filled 2013-12-20: qty 4

## 2013-12-20 MED ORDER — MIDAZOLAM HCL 2 MG/2ML IJ SOLN
INTRAMUSCULAR | Status: AC | PRN
  Administered 2013-12-20: 1 mg via INTRAVENOUS
  Administered 2013-12-20 (×2): 0.5 mg via INTRAVENOUS
  Administered 2013-12-20: 1 mg via INTRAVENOUS
  Administered 2013-12-20: 0.5 mg via INTRAVENOUS

## 2013-12-20 NOTE — Procedures (Signed)
Successful placement of right biliary internal/external drain.  There was obstruction of the distal CBD with biliary stones.  A 10 French drain was placed and bile sample sent for culture.   No immediate complication.

## 2013-12-20 NOTE — H&P (Signed)
Brandy [name] is an 80 y.o. female.   Chief Complaint: Pt with history pancreatitis episode 2012 Pancreatic head biopsy neg then Hx Breast Ca Now presents with 1 week history of N/V/abd pain CT Abd reveals dilatation of common bile duct suspicious of choledocholithiasis MRCP was attempted 6/16 + choledocholithiasis with ampulla stricture Dr Benson Norway asked for IR to consult for Biliary drain placement with possible sphincteroplasty and stone removal Dr Laurence Ferrari has seen and reviewed imaging and chart Scheduled now for this procedure  HPI: asthma; HLD; Breast Ca; HTN; renal stones  Past Medical History  Diagnosis Date  . Asthma   . Hyperlipidemia     takes Simvastatin daily  . Hypertension     takes Amlodipine and Diovan daily  . Cancer     left breast  . Bronchitis     hx of;last time >55yrago  . Arthritis     shoulders  . Dysphagia   . H/O hiatal hernia   . GERD (gastroesophageal reflux disease)     takes Prilosec prn  . Urinary urgency   . History of kidney stones     many yrs ago  . Depression     takes Celexa daily  . Insomnia     takes Ambien nightly  . Breast cancer 12/18/11    left breast masectomy=metastatic ca in (1/1) lymph node ,invasive ductal ca,2 foci,,dcis,lymph ovascular invasion identified,surgical resection margins neg for ca,additional tissue=benign skin and subcutaneous tissue  . Breast CA 11/23/11    left breast 3 o'clock bx=high grade ductal ca in situ w/comedononecrosis and calcification,dcis,lymphovascular invasion present ,ER?PR+positive  . Full dentures   . Hx of radiation therapy 04/26/12 -06/10/12    left breast    Past Surgical History  Procedure Laterality Date  . Breast biopsy  1998    left  . Total shoulder replacement  2011    left  . Eus  06/04/2011    Procedure: UPPER ENDOSCOPIC ULTRASOUND (EUS) LINEAR;  Surgeon: DOwens Loffler MD;  Location: [facility];  Service: Endoscopy;  Laterality: N/A;  . Exploratory laparotomy        biopsy of intra-abdominal mass  . Bladder tack    . Tubal ligation    . Appendectomy    . Dilation and curettage of uterus    . Esophagogastroduodenoscopy    . Mastectomy w/ sentinel node biopsy  12/18/2011    Procedure: MASTECTOMY WITH SENTINEL LYMPH NODE BIOPSY;  Surgeon: MRolm Bookbinder MD;  Location: [facility]  Service: General;  Laterality: Left;  . Portacath placement  01/27/2012    Procedure: INSERTION PORT-A-CATH;  Surgeon: MRolm Bookbinder MD;  Location: [facility]  Service: General;  Laterality: Right;  PORT PLACEMENT  . Port-a-cath removal Right 06/15/2013    Procedure: REMOVAL PORT-A-CATH;  Surgeon: MRolm Bookbinder MD;  Location: [facility]  Service: General;  Laterality: Right;    Family History  Problem Relation Age of Onset  . Cancer Father     prostate  . Cancer Other     breast ca   Social History:  reports that she has never smoked. She has never used smokeless tobacco. She reports that she does not drink alcohol or use illicit drugs.  Allergies: No Known Allergies  Medications Prior to Admission  Medication Sig Dispense Refill  . amLODipine (NORVASC) 10 MG tablet Take 10 mg by mouth daily.       . citalopram (CELEXA) 20 MG tablet Take 20 mg by mouth daily.      .Marland Kitchen  gabapentin (NEURONTIN) 100 MG capsule Take 300 mg by mouth at bedtime.      Marland Kitchen letrozole (FEMARA) 2.5 MG tablet Take 2.5 mg by mouth daily.      Marland Kitchen omeprazole (PRILOSEC) 20 MG capsule Take 20 mg by mouth daily.      . simvastatin (ZOCOR) 20 MG tablet Take 20 mg by mouth daily.       Marland Kitchen spironolactone (ALDACTONE) 25 MG tablet Take 0.5 tablets (12.5 mg total) by mouth daily.  30 tablet  6  . valsartan-hydrochlorothiazide (DIOVAN-HCT) 160-12.5 MG per tablet Take 2 tablets by mouth daily.      Marland Kitchen zolpidem (AMBIEN) 10 MG tablet Take 10 mg by mouth at bedtime as needed for sleep.         Results for orders placed during the hospital encounter of 12/17/13 (from the past 48  hour(s))  CULTURE, BLOOD (ROUTINE X 2)     Status: None   Collection Time    12/18/13 11:10 AM      Result Value Ref Range   Specimen Description BLOOD RIGHT HAND     Special Requests BOTTLES DRAWN AEROBIC AND ANAEROBIC 10CC     Culture  Setup Time       Value: 12/18/2013 16:54     Performed at [facility]   Culture       Value:        BLOOD CULTURE RECEIVED NO GROWTH TO DATE CULTURE WILL BE HELD FOR 5 DAYS BEFORE ISSUING A FINAL NEGATIVE REPORT     Performed at [facility]   Report Status PENDING    CULTURE, BLOOD (ROUTINE X 2)     Status: None   Collection Time    12/18/13 11:20 AM      Result Value Ref Range   Specimen Description BLOOD RIGHT WRIST     Special Requests BOTTLES DRAWN AEROBIC AND ANAEROBIC 10CC     Culture  Setup Time       Value: 12/18/2013 16:54     Performed at [facility]   Culture       Value:        BLOOD CULTURE RECEIVED NO GROWTH TO DATE CULTURE WILL BE HELD FOR 5 DAYS BEFORE ISSUING A FINAL NEGATIVE REPORT     Performed at [facility]   Report Status PENDING    COMPREHENSIVE METABOLIC PANEL     Status: Abnormal   Collection Time    12/19/13  5:45 AM      Result Value Ref Range   Sodium 140  137 - 147 mEq/L   Potassium 4.0  3.7 - 5.3 mEq/L   Chloride 107  96 - 112 mEq/L   CO2 21  19 - 32 mEq/L   Glucose, Bld 96  70 - 99 mg/dL   BUN 13  6 - 23 mg/dL   Comment: DELTA CHECK NOTED   Creatinine, Ser 0.61  0.50 - 1.10 mg/dL   Comment: DELTA CHECK NOTED   Calcium 7.4 (*) 8.4 - 10.5 mg/dL   Total Protein 6.1  6.0 - 8.3 g/dL   Albumin 2.5 (*) 3.5 - 5.2 g/dL   AST 30  0 - 37 U/L   ALT 48 (*) 0 - 35 U/L   Alkaline Phosphatase 101  39 - 117 U/L   Total Bilirubin 0.6  0.3 - 1.2 mg/dL   GFR calc non Af Amer 85 (*) >90 mL/min   GFR calc Af Amer >90  >90  mL/min   Comment: (NOTE)     The eGFR has been calculated using the CKD EPI equation.     This calculation has not been validated in all clinical situations.     eGFR's  persistently <90 mL/min signify possible Chronic Kidney     Disease.  CBC     Status: Abnormal   Collection Time    12/19/13  5:45 AM      Result Value Ref Range   WBC 8.6  4.0 - 10.5 K/uL   RBC 3.42 (*) 3.87 - 5.11 MIL/uL   Hemoglobin 9.7 (*) 12.0 - 15.0 g/dL   HCT 30.0 (*) 36.0 - 46.0 %   MCV 87.7  78.0 - 100.0 fL   MCH 28.4  26.0 - 34.0 pg   MCHC 32.3  30.0 - 36.0 g/dL   RDW 14.4  11.5 - 15.5 %   Platelets 235  150 - 400 K/uL  PROTIME-INR     Status: None   Collection Time    12/19/13  9:08 PM      Result Value Ref Range   Prothrombin Time 14.5  11.6 - 15.2 seconds   INR 1.15  0.00 - 1.49  APTT     Status: None   Collection Time    12/19/13  9:08 PM      Result Value Ref Range   aPTT 33  24 - 37 seconds   Mr Abdomen Mrcp Wo Cm  12/18/2013   CLINICAL DATA:  Biliary ductal dilatation. Nausea and vomiting. Personal history of pancreatitis, breast carcinoma, and lymphoma.  EXAM: MRI ABDOMEN WITHOUT  (INCLUDING MRCP)  TECHNIQUE: Multiplanar multisequence MR imaging of the abdomen was performed. Heavily T2-weighted images of the biliary and pancreatic ducts were obtained, and three-dimensional MRCP images were rendered by post processing.  COMPARISON:  Noncontrast CT on 12/17/2013  FINDINGS: Diffuse biliary ductal dilatation seen, with proximal common bile duct measuring 17 mm in diameter. A smooth stricture is seen involving the intrapancreatic portion of the distal common bile duct which has a benign appearance. No pancreatic mass visualized on this noncontrast study and there is no evidence of pancreatic ductal dilatation. At least 2 large calculi are seen in the distal common bile duct measuring approximately 10 mm and 11 mm in diameter. Gallbladder is unremarkable in appearance.  Noncontrast images of the liver, spleen, adrenal glands are normal in appearance. Small bilateral renal parenchymal cysts and left sided parapelvic cysts are noted, without definite evidence of renal mass or  hydronephrosis on this noncontrast study.  Small hiatal hernia noted. No evidence of abdominal lymphadenopathy. No other inflammatory process or abnormal fluid collections seen within the abdomen. Visualized abdominal bowel loops are nondilated.  IMPRESSION: Diffuse biliary ductal dilatation, with choledocholithiasis and benign appearing distal common bile duct stricture.  No evidence of pancreatic mass or pancreatic ductal dilatation.   Electronically Signed   By: Earle Gell M.D.   On: 12/18/2013 13:08    Review of Systems  Constitutional: Positive for weight loss and malaise/fatigue. Negative for fever.  Respiratory: Negative for shortness of breath.   Cardiovascular: Negative for chest pain.  Gastrointestinal: Positive for nausea, vomiting, abdominal pain and diarrhea.  Musculoskeletal: Negative for back pain.  Neurological: Positive for weakness.  Psychiatric/Behavioral: Negative for substance abuse.    Blood pressure 123/45, pulse 61, temperature 98.4 F (36.9 C), temperature source Oral, resp. rate 20, height 5' (1.524 m), weight 69.3 kg (152 lb 12.5 oz), SpO2 97.00%. Physical Exam  Constitutional:  She is oriented to person, place, and time. She appears well-nourished.  Cardiovascular: Normal rate and regular rhythm.   No murmur heard. Respiratory: Effort normal and breath sounds normal. She has no wheezes.  GI: Bowel sounds are normal. There is tenderness.  Musculoskeletal: Normal range of motion.  Neurological: She is alert and oriented to person, place, and time.  Skin: Skin is warm and dry.  Psychiatric: She has a normal mood and affect. Her behavior is normal. Judgment and thought content normal.     Assessment/Plan N/V/Abd pain CBD dilatation on CT MRCP + choledocholithiasis and ampullary stricture Now scheduled for Bili drain and poss sphinctertoplasty/sone removal Pt aware of procedure benefits and risks and agreeable to proceed Consent signed and in  chart  Elijiah Mickley A 12/20/2013, 8:15 AM

## 2013-12-20 NOTE — Progress Notes (Signed)
  PROGRESS NOTE  Brandy [name]:[id number] DOB: 1932/10/22 DOA: 12/17/2013 PCP: Tawanna Solo, MD  Summary: 80 year old woman with history of pancreatitis one episode in the past presented to the emergency department with one-week history of nausea, vomiting, pain with eating, low-grade fever.  Assessment/Plan: 1. Choledocolithiasis. She underwent ERCP on 12/19/2013, procedure performed by Dr Benson Norway. IR placing right biliary internal/external drain. She tolerated procedure well no immediate complications.   2. Possible early cholangitis with possible sepsis on admission with hypotension. Continue Cipro and Flagyl. She is hemodynamically stable, afebrile, nontoxic. White count trending normalizing.  3. H/o pancreatitis  4. H/o breast cancer in remission 5. Recurrent large ventral hernia without evidence of complicating features. Outpatient referral to general surgery for consideration of elective repair recommended.   CMP, CBC in the morning.  Obtain cultures though likely to be unrevealing as the patient has received antibiotics.  Code Status: full code DVT prophylaxis: SCDs until procedure complete or ruled out Family Communication: Spoke with husband Disposition Plan: Likely home when improved.   12/20/2013, 4:31 PM  LOS: 3 days   Consultants:  Gastroenterology  Procedures:    Antibiotics:  Ciprofloxacin 6/14 >>   Metronidazole 6/14 >>   HPI/Subjective: Patient seen post procedure, she is in no acute distress, drain in place  Objective: Filed Vitals:   12/20/13 1520 12/20/13 1525 12/20/13 1532 12/20/13 1602  BP: 121/61 128/56 123/65 109/45  Pulse: 67 64 65 61  Temp:    97.6 F (36.4 C)  TempSrc:    Oral  Resp: 15 19 16 16   Height:      Weight:      SpO2: 93% 95% 96% 92%    Intake/Output Summary (Last 24 hours) at 12/20/13 1631 Last data filed at 12/20/13 1220  Gross per 24 hour  Intake 2396.67 ml  Output   3950 ml  Net -1553.33 ml     Filed  Weights   12/17/13 1740 12/17/13 2345  Weight: 69.854 kg (154 lb) 69.3 kg (152 lb 12.5 oz)    Exam:   Afebrile, hypotensive early this morning. Vitals currently stable. Gen. Appears calm and comfortable. Nontoxic. Psych. Alert. Grossly normal mood and affect. Speech fluent and appropriate. Cardiovascular. Regular rate and rhythm. 2/6 systolic murmur. No rub or gallop. Respiratory. Clear to auscultation bilaterally. No wheezes, rales or rhonchi. Normal respiratory effort. Abdomen. Soft, nontender, nondistended, s/p percutaneous drain placement    Scheduled Meds: . amLODipine  10 mg Oral Daily  . ciprofloxacin  400 mg Intravenous Q12H  . citalopram  20 mg Oral Daily  . fentaNYL      . gabapentin  300 mg Oral QHS  . irbesartan  300 mg Oral Daily   And  . hydrochlorothiazide  25 mg Oral Daily  . letrozole  2.5 mg Oral Daily  . metronidazole  500 mg Intravenous Q8H  . midazolam      . pantoprazole  40 mg Oral Daily  . simvastatin  20 mg Oral q1800  . zolpidem  5 mg Oral QHS   Continuous Infusions: . sodium chloride 100 mL/hr at 12/20/13 1233    Principal Problem:   Choledocholithiasis Active Problems:   Sepsis   Protein-calorie malnutrition, severe   Time spent 25 minutes

## 2013-12-21 DIAGNOSIS — E43 Unspecified severe protein-calorie malnutrition: Secondary | ICD-10-CM

## 2013-12-21 NOTE — Progress Notes (Signed)
  PROGRESS NOTE  Brandy [name] PQD:[id number] DOB: 04-Apr-1933 DOA: 12/17/2013 PCP: Tawanna Solo, MD  Summary: 80 year old woman with history of pancreatitis one episode in the past presented to the emergency department with one-week history of nausea, vomiting, pain with eating, low-grade fever.  Assessment/Plan: 1. Choledocolithiasis.  -She underwent ERCP on 12/19/2013, procedure performed by Dr Benson Norway. IR placing right biliary internal/external drain. She tolerated procedure well no immediate complications. -Plan for ampullary stricture sphincteroplasty with stone removal and probable biliary drain exchange    2. Possible early cholangitis with possible sepsis on admission with hypotension.  -Continue Cipro and Flagyl. She is hemodynamically stable, afebrile, nontoxic. White count trending normalizing.   3. Recurrent large ventral hernia without evidence of complicating features. Outpatient referral to general surgery for consideration of elective repair recommended. 4.  Code Status: full code DVT prophylaxis: SCDs until procedure complete or ruled out Family Communication: Spoke with husband Disposition Plan: Likely home when improved.   12/21/2013, 5:27 PM  LOS: 4 days   Consultants:  Gastroenterology  Procedures:    Antibiotics:  Ciprofloxacin 6/14 >>   Metronidazole 6/14 >>   HPI/Subjective: Patient seen post procedure, she is in no acute distress, drain in place  Objective: Filed Vitals:   12/20/13 2151 12/21/13 0623 12/21/13 1013 12/21/13 1415  BP: 116/48 108/48 103/41 102/38  Pulse: 59 53  54  Temp: 98.5 F (36.9 C) 98.3 F (36.8 C)  97.4 F (36.3 C)  TempSrc: Oral   Oral  Resp: 16 16  18   Height:      Weight:      SpO2: 93% 95%  93%    Intake/Output Summary (Last 24 hours) at 12/21/13 1727 Last data filed at 12/21/13 1624  Gross per 24 hour  Intake   1930 ml  Output   2870 ml  Net   -940 ml     Filed Weights   12/17/13 1740 12/17/13 2345    Weight: 69.854 kg (154 lb) 69.3 kg (152 lb 12.5 oz)    Exam:   Afebrile, hypotensive early this morning. Vitals currently stable. Gen. Appears calm and comfortable. Nontoxic. Psych. Alert. Grossly normal mood and affect. Speech fluent and appropriate. Cardiovascular. Regular rate and rhythm. 2/6 systolic murmur. No rub or gallop. Respiratory. Clear to auscultation bilaterally. No wheezes, rales or rhonchi. Normal respiratory effort. Abdomen. Soft, nontender, nondistended, s/p percutaneous drain placement    Scheduled Meds: . amLODipine  10 mg Oral Daily  . ciprofloxacin  400 mg Intravenous Q12H  . citalopram  20 mg Oral Daily  . gabapentin  300 mg Oral QHS  . irbesartan  300 mg Oral Daily   And  . hydrochlorothiazide  25 mg Oral Daily  . letrozole  2.5 mg Oral Daily  . metronidazole  500 mg Intravenous Q8H  . pantoprazole  40 mg Oral Daily  . simvastatin  20 mg Oral q1800  . zolpidem  5 mg Oral QHS   Continuous Infusions: . sodium chloride 100 mL/hr at 12/21/13 1618    Principal Problem:   Choledocholithiasis Active Problems:   Sepsis   Protein-calorie malnutrition, severe   Time spent 20 minutes

## 2013-12-21 NOTE — H&P (Signed)
Brandy [name] is an 80 y.o. female.   Chief Complaint: pt has had an episode of pancreatitis 2012 None since then 1 week of abd pain; N/V and presented to ED Work up CT revealed common bile duct dilatation and possible choledocholithiasis ERCP with Dr Benson Norway was unsuccessful for decompression secondary ampullar stricture IR placed R int/external biliary drain 6/16 Pt doing well this am Afeb; wbc wnl; vss Output 570 cc since placement Dr Anselm Pancoast and Dr Benson Norway discussed plan for pt. Now scheduled for ampullary stricture sphincteroplasty with stone removal and probable biliary drain exchange tomorrow in IR  HPI: HLD; asthma; HTN; breast ca  Past Medical History  Diagnosis Date  . Asthma   . Hyperlipidemia     takes Simvastatin daily  . Hypertension     takes Amlodipine and Diovan daily  . Cancer     left breast  . Bronchitis     hx of;last time >58yrago  . Arthritis     shoulders  . Dysphagia   . H/O hiatal hernia   . GERD (gastroesophageal reflux disease)     takes Prilosec prn  . Urinary urgency   . History of kidney stones     many yrs ago  . Depression     takes Celexa daily  . Insomnia     takes Ambien nightly  . Breast cancer 12/18/11    left breast masectomy=metastatic ca in (1/1) lymph node ,invasive ductal ca,2 foci,,dcis,lymph ovascular invasion identified,surgical resection margins neg for ca,additional tissue=benign skin and subcutaneous tissue  . Breast CA 11/23/11    left breast 3 o'clock bx=high grade ductal ca in situ w/comedononecrosis and calcification,dcis,lymphovascular invasion present ,ER?PR+positive  . Full dentures   . Hx of radiation therapy 04/26/12 -06/10/12    left breast    Past Surgical History  Procedure Laterality Date  . Breast biopsy  1998    left  . Total shoulder replacement  2011    left  . Eus  06/04/2011    Procedure: UPPER ENDOSCOPIC ULTRASOUND (EUS) LINEAR;  Surgeon: DOwens Loffler MD;  Location: [facility];  Service: Endoscopy;   Laterality: N/A;  . Exploratory laparotomy       biopsy of intra-abdominal mass  . Bladder tack    . Tubal ligation    . Appendectomy    . Dilation and curettage of uterus    . Esophagogastroduodenoscopy    . Mastectomy w/ sentinel node biopsy  12/18/2011    Procedure: MASTECTOMY WITH SENTINEL LYMPH NODE BIOPSY;  Surgeon: MRolm Bookbinder MD;  [place] [facility]  Service: General;  Laterality: Left;  . Portacath placement  01/27/2012    Procedure: INSERTION PORT-A-CATH;  Surgeon: MRolm Bookbinder MD;  [place] [facility]  Service: General;  Laterality: Right;  PORT PLACEMENT  . Port-a-cath removal Right 06/15/2013    Procedure: REMOVAL PORT-A-CATH;  Surgeon: MRolm Bookbinder MD;  [place] [facility]  Service: General;  Laterality: Right;  . Esophagogastroduodenoscopy (egd) with propofol  12/19/2013    Procedure: ESOPHAGOGASTRODUODENOSCOPY (EGD) WITH PROPOFOL;  Surgeon: PBeryle Beams MD;  [facility];  Service: Endoscopy;;    Family History  Problem Relation Age of Onset  . Cancer Father     prostate  . Cancer Other     breast ca   Social History:  reports that she has never smoked. She has never used smokeless tobacco. She reports that she does not drink alcohol or use illicit drugs.  Allergies: No Known  Allergies  Medications Prior to Admission  Medication Sig Dispense Refill  . amLODipine (NORVASC) 10 MG tablet Take 10 mg by mouth daily.       . citalopram (CELEXA) 20 MG tablet Take 20 mg by mouth daily.      Marland Kitchen gabapentin (NEURONTIN) 100 MG capsule Take 300 mg by mouth at bedtime.      Marland Kitchen letrozole (FEMARA) 2.5 MG tablet Take 2.5 mg by mouth daily.      Marland Kitchen omeprazole (PRILOSEC) 20 MG capsule Take 20 mg by mouth daily.      . simvastatin (ZOCOR) 20 MG tablet Take 20 mg by mouth daily.       Marland Kitchen spironolactone (ALDACTONE) 25 MG tablet Take 0.5 tablets (12.5 mg total) by mouth daily.  30 tablet  6  . valsartan-hydrochlorothiazide  (DIOVAN-HCT) 160-12.5 MG per tablet Take 2 tablets by mouth daily.      Marland Kitchen zolpidem (AMBIEN) 10 MG tablet Take 10 mg by mouth at bedtime as needed for sleep.         Results for orders placed during the hospital encounter of 12/17/13 (from the past 48 hour(s))  PROTIME-INR     Status: None   Collection Time    12/19/13  9:08 PM      Result Value Ref Range   Prothrombin Time 14.5  11.6 - 15.2 seconds   INR 1.15  0.00 - 1.49  APTT     Status: None   Collection Time    12/19/13  9:08 PM      Result Value Ref Range   aPTT 33  24 - 37 seconds  BODY FLUID CULTURE     Status: None   Collection Time    12/20/13  3:53 PM      Result Value Ref Range   Specimen Description BILE     Special Requests NONE     Gram Stain       Value: NO WBC SEEN     NO ORGANISMS SEEN     Performed at [facility]   Culture PENDING     Report Status PENDING     Ir Biliary Drain Catheter Placement  12/20/2013   CLINICAL DATA:  80 year old with biliary stones and an ampulla stricture. The biliary system could not be decompressed with ERCP.  EXAM: PERCUTANEOUS TRANSHEPATIC CHOLANGIOGRAM; PLACEMENT OF INTERNAL/EXTERNAL BILIARY DRAIN;  ULTRASOUND GUIDANCE  Physician: Stephan Minister. Henn, MD  FLUOROSCOPY TIME:  6 min  MEDICATIONS: 3 mg versed, 87.5 mcg fentanyl. A radiology nurse monitored the patient for moderate sedation. 400 mg ciprofloxacin. Ciprofloxacin was given within two hours of incision.  ANESTHESIA/SEDATION: Moderate sedation time: 30 min  PROCEDURE: The procedure was explained to the patient. The risks and benefits of the procedure were discussed and the patient's questions were addressed. Informed consent was obtained from the patient. The right side of the abdomen was prepped and draped in sterile fashion. Maximal barrier sterile technique was utilized including caps, mask, sterile gowns, sterile gloves, sterile drape, hand hygiene and skin antiseptic. Ultrasound was used to evaluate the right hepatic lobe  and identify the gallbladder. The peripheral ducts were not dilated by ultrasound. Ultrasound was used to localize the right hepatic lobe and the location of the gallbladder. Right intercostal area was anesthetized with 1% lidocaine. A 21 gauge needle was directed into the right hepatic lobe with fluoroscopy. Contrast was slowly injected as the needle was withdrawn. The biliary system was opacified with contrast under fluoroscopy. Needle was  positioned within a small branch. A 0.018 wire was advanced into the biliary system. An Accustick dilator set was placed. Contrast was injected and confirmed placement in the biliary system. Accustick dilator set was exchanged for a 5 [place] Kumpe catheter. The 5 [place] Kumpe catheter was advanced into the duodenum with a Glidewire. A J wire was placed. The tract was dilated and 10 [place] biliary drain was placed. Contrast injection confirmed placement in the biliary system. Catheter was sutured to the skin and attached to gravity bag. Bile sample was sent for culture.  FINDINGS: The extrahepatic biliary system and central intrahepatic ducts were dilated. The peripheral ducts were not dilated. The initial cholangiogram demonstrated a distal common bile duct obstruction and no drainage into the duodenum. There was filling the cystic duct and gallbladder. At least two filling defects in the distal common bile duct are compatible with stones. Small right hepatic branch was cannulated and used for percutaneous access. Catheter was easily advanced beyond the ampulla. 10 French catheter was placed in the duodenum.  COMPLICATIONS: None  IMPRESSION: Percutaneous transhepatic cholangiogram demonstrated a distal common bile duct obstruction. At least two large filling defects in the common bile duct are consistent with stones.  Successful placement of an internal/external biliary drain.   Electronically Signed   By: Markus Daft M.D.   On: 12/20/2013 18:07   Ir Ptc  12/20/2013   CLINICAL  DATA:  80 year old with biliary stones and an ampulla stricture. The biliary system could not be decompressed with ERCP.  EXAM: PERCUTANEOUS TRANSHEPATIC CHOLANGIOGRAM; PLACEMENT OF INTERNAL/EXTERNAL BILIARY DRAIN;  ULTRASOUND GUIDANCE  Physician: Stephan Minister. Henn, MD  FLUOROSCOPY TIME:  6 min  MEDICATIONS: 3 mg versed, 87.5 mcg fentanyl. A radiology nurse monitored the patient for moderate sedation. 400 mg ciprofloxacin. Ciprofloxacin was given within two hours of incision.  ANESTHESIA/SEDATION: Moderate sedation time: 30 min  PROCEDURE: The procedure was explained to the patient. The risks and benefits of the procedure were discussed and the patient's questions were addressed. Informed consent was obtained from the patient. The right side of the abdomen was prepped and draped in sterile fashion. Maximal barrier sterile technique was utilized including caps, mask, sterile gowns, sterile gloves, sterile drape, hand hygiene and skin antiseptic. Ultrasound was used to evaluate the right hepatic lobe and identify the gallbladder. The peripheral ducts were not dilated by ultrasound. Ultrasound was used to localize the right hepatic lobe and the location of the gallbladder. Right intercostal area was anesthetized with 1% lidocaine. A 21 gauge needle was directed into the right hepatic lobe with fluoroscopy. Contrast was slowly injected as the needle was withdrawn. The biliary system was opacified with contrast under fluoroscopy. Needle was positioned within a small branch. A 0.018 wire was advanced into the biliary system. An Accustick dilator set was placed. Contrast was injected and confirmed placement in the biliary system. Accustick dilator set was exchanged for a 5 [place] Kumpe catheter. The 5 [place] Kumpe catheter was advanced into the duodenum with a Glidewire. A J wire was placed. The tract was dilated and 10 [place] biliary drain was placed. Contrast injection confirmed placement in the biliary system. Catheter was  sutured to the skin and attached to gravity bag. Bile sample was sent for culture.  FINDINGS: The extrahepatic biliary system and central intrahepatic ducts were dilated. The peripheral ducts were not dilated. The initial cholangiogram demonstrated a distal common bile duct obstruction and no drainage into the duodenum. There was filling the cystic duct and gallbladder.  At least two filling defects in the distal common bile duct are compatible with stones. Small right hepatic branch was cannulated and used for percutaneous access. Catheter was easily advanced beyond the ampulla. 10 French catheter was placed in the duodenum.  COMPLICATIONS: None  IMPRESSION: Percutaneous transhepatic cholangiogram demonstrated a distal common bile duct obstruction. At least two large filling defects in the common bile duct are consistent with stones.  Successful placement of an internal/external biliary drain.   Electronically Signed   By: Markus Daft M.D.   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 The biliary system could not be decompressed with ERCP.  EXAM: PERCUTANEOUS TRANSHEPATIC CHOLANGIOGRAM; PLACEMENT OF INTERNAL/EXTERNAL BILIARY DRAIN;  ULTRASOUND GUIDANCE  Physician: Stephan Minister. Henn, MD  FLUOROSCOPY TIME:  6 min  MEDICATIONS: 3 mg versed, 87.5 mcg fentanyl. A radiology nurse monitored the patient for moderate sedation. 400 mg ciprofloxacin. Ciprofloxacin was given within two hours of incision.  ANESTHESIA/SEDATION: Moderate sedation time: 30 min  PROCEDURE: The procedure was explained to the patient. The risks and benefits of the procedure were discussed and the patient's questions were addressed. Informed consent was obtained from the patient. The right side of the abdomen was prepped and draped in sterile fashion. Maximal barrier sterile technique was utilized including caps, mask, sterile gowns, sterile gloves, sterile drape,  hand hygiene and skin antiseptic. Ultrasound was used to evaluate the right hepatic lobe and identify the gallbladder. The peripheral ducts were not dilated by ultrasound. Ultrasound was used to localize the right hepatic lobe and the location of the gallbladder. Right intercostal area was anesthetized with 1% lidocaine. A 21 gauge needle was directed into the right hepatic lobe with fluoroscopy. Contrast was slowly injected as the needle was withdrawn. The biliary system was opacified with contrast under fluoroscopy. Needle was positioned within a small branch. A 0.018 wire was advanced into the biliary system. An Accustick dilator set was placed. Contrast was injected and confirmed placement in the biliary system. Accustick dilator set was exchanged for a 5 [place] Kumpe catheter. The 5 [place] Kumpe catheter was advanced into the duodenum with a Glidewire. A J wire was placed. The tract was dilated and 10 [place] biliary drain was placed. Contrast injection confirmed placement in the biliary system. Catheter was sutured to the skin and attached to gravity bag. Bile sample was sent for culture.  FINDINGS: The extrahepatic biliary system and central intrahepatic ducts were dilated. The peripheral ducts were not dilated. The initial cholangiogram demonstrated a distal common bile duct obstruction and no drainage into the duodenum. There was filling the cystic duct and gallbladder. At least two filling defects in the distal common bile duct are compatible with stones. Small right hepatic branch was cannulated and used for percutaneous access. Catheter was easily advanced beyond the ampulla. 10 French catheter was placed in the duodenum.  COMPLICATIONS: None  IMPRESSION: Percutaneous transhepatic cholangiogram demonstrated a distal common bile duct obstruction. At least two large filling defects in the common bile duct are consistent with stones.  Successful placement of an internal/external biliary drain.    Electronically Signed   By: Markus Daft M.D.   On: 12/20/2013 18:07    Review of Systems  Constitutional: Negative for fever and chills.  Respiratory: Negative for shortness of breath.   Cardiovascular: Negative for chest pain.  Gastrointestinal: Positive for abdominal pain. Negative for nausea and vomiting.  Neurological: Positive for weakness. Negative for dizziness and headaches.    Blood pressure 103/41, pulse 53, temperature 98.3 F (36.8 C), temperature source Oral, resp. rate 16, height 5' (1.524 m), weight 69.3 kg (152 lb 12.5 oz), SpO2 95.00%. Physical Exam  Constitutional: She is oriented to person, place, and time. She appears well-developed.  Cardiovascular: Normal rate.   Murmur heard. Respiratory: Effort normal and breath sounds normal.  GI: Soft. Bowel sounds are normal.  Musculoskeletal: Normal range of motion.  Neurological: She is alert and oriented to person, place, and time.  Skin: Skin is warm and dry.  Psychiatric: She has a normal mood and affect. Her behavior is normal. Judgment and thought content normal.     Assessment/Plan Ampullary stricture with + choledocholithiasis Rt Biliary Int/Ext drain placed in IR 6/16 Now scheduled for ampullary sphincteroplasty/stone removal/drain exchange in IR 6/18 Pt and son aware of procedure benefits and risks and agreeable to proceed Consent signed and in chart Pt on Cipro  TURPIN,PAMELA A 12/21/2013, 11:55 AM

## 2013-12-22 ENCOUNTER — Inpatient Hospital Stay (HOSPITAL_COMMUNITY): Payer: Medicare HMO

## 2013-12-22 LAB — CBC WITH DIFFERENTIAL/PLATELET
BASOS PCT: 0 % (ref 0–1)
Basophils Absolute: 0 10*3/uL (ref 0.0–0.1)
EOS PCT: 2 % (ref 0–5)
Eosinophils Absolute: 0.1 10*3/uL (ref 0.0–0.7)
HCT: 33.8 % — ABNORMAL LOW (ref 36.0–46.0)
HEMOGLOBIN: 11 g/dL — AB (ref 12.0–15.0)
LYMPHS PCT: 21 % (ref 12–46)
Lymphs Abs: 1.5 10*3/uL (ref 0.7–4.0)
MCH: 28.2 pg (ref 26.0–34.0)
MCHC: 32.5 g/dL (ref 30.0–36.0)
MCV: 86.7 fL (ref 78.0–100.0)
Monocytes Absolute: 0.5 10*3/uL (ref 0.1–1.0)
Monocytes Relative: 7 % (ref 3–12)
NEUTROS ABS: 5.2 10*3/uL (ref 1.7–7.7)
Neutrophils Relative %: 70 % (ref 43–77)
Platelets: 284 10*3/uL (ref 150–400)
RBC: 3.9 MIL/uL (ref 3.87–5.11)
RDW: 14.3 % (ref 11.5–15.5)
WBC: 7.4 10*3/uL (ref 4.0–10.5)

## 2013-12-22 LAB — COMPREHENSIVE METABOLIC PANEL
ALBUMIN: 2.6 g/dL — AB (ref 3.5–5.2)
ALT: 17 U/L (ref 0–35)
AST: 12 U/L (ref 0–37)
Alkaline Phosphatase: 72 U/L (ref 39–117)
BUN: 6 mg/dL (ref 6–23)
CALCIUM: 7.1 mg/dL — AB (ref 8.4–10.5)
CO2: 21 mEq/L (ref 19–32)
Chloride: 106 mEq/L (ref 96–112)
Creatinine, Ser: 0.57 mg/dL (ref 0.50–1.10)
GFR calc non Af Amer: 87 mL/min — ABNORMAL LOW (ref >90)
GLUCOSE: 123 mg/dL — AB (ref 70–99)
POTASSIUM: 3.7 meq/L (ref 3.7–5.3)
Sodium: 139 mEq/L (ref 137–147)
TOTAL PROTEIN: 6 g/dL (ref 6.0–8.3)
Total Bilirubin: 0.3 mg/dL (ref 0.3–1.2)

## 2013-12-22 MED ORDER — FENTANYL CITRATE 0.05 MG/ML IJ SOLN
INTRAMUSCULAR | Status: AC
  Filled 2013-12-22: qty 2

## 2013-12-22 MED ORDER — CIPROFLOXACIN HCL 500 MG PO TABS
500.0000 mg | ORAL_TABLET | Freq: Two times a day (BID) | ORAL | Status: DC
  Administered 2013-12-22 – 2013-12-23 (×2): 500 mg via ORAL
  Filled 2013-12-22 (×4): qty 1

## 2013-12-22 MED ORDER — FENTANYL CITRATE 0.05 MG/ML IJ SOLN
INTRAMUSCULAR | Status: AC | PRN
  Administered 2013-12-22: 25 ug via INTRAVENOUS
  Administered 2013-12-22: 50 ug via INTRAVENOUS
  Administered 2013-12-22: 25 ug via INTRAVENOUS

## 2013-12-22 MED ORDER — OXYCODONE HCL 5 MG PO TABS
5.0000 mg | ORAL_TABLET | Freq: Once | ORAL | Status: AC | PRN
Start: 2013-12-22 — End: 2013-12-22

## 2013-12-22 MED ORDER — METRONIDAZOLE 500 MG PO TABS
500.0000 mg | ORAL_TABLET | Freq: Three times a day (TID) | ORAL | Status: DC
  Administered 2013-12-22 – 2013-12-23 (×3): 500 mg via ORAL
  Filled 2013-12-22 (×6): qty 1

## 2013-12-22 MED ORDER — FENTANYL CITRATE 0.05 MG/ML IJ SOLN
25.0000 ug | INTRAMUSCULAR | Status: DC | PRN
Start: 2013-12-22 — End: 2013-12-23

## 2013-12-22 MED ORDER — MIDAZOLAM HCL 2 MG/2ML IJ SOLN
INTRAMUSCULAR | Status: AC | PRN
  Administered 2013-12-22: 0.5 mg via INTRAVENOUS
  Administered 2013-12-22: 1 mg via INTRAVENOUS
  Administered 2013-12-22: 0.5 mg via INTRAVENOUS

## 2013-12-22 MED ORDER — MEPERIDINE HCL 25 MG/ML IJ SOLN: 6.2500 mg | INTRAMUSCULAR | Status: DC | PRN

## 2013-12-22 MED ORDER — SODIUM CHLORIDE 0.9 % IV SOLN: INTRAVENOUS | Status: DC

## 2013-12-22 MED ORDER — IOHEXOL 300 MG/ML  SOLN
100.0000 mL | Freq: Once | INTRAMUSCULAR | Status: AC | PRN
  Administered 2013-12-22: 40 mL via INTRAVENOUS

## 2013-12-22 MED ORDER — PROMETHAZINE HCL 25 MG/ML IJ SOLN: 6.2500 mg | INTRAMUSCULAR | Status: DC | PRN

## 2013-12-22 MED ORDER — OXYCODONE HCL 5 MG/5ML PO SOLN: 5.0000 mg | Freq: Once | ORAL | Status: AC | PRN

## 2013-12-22 MED ORDER — MORPHINE SULFATE 4 MG/ML IJ SOLN
INTRAMUSCULAR | Status: AC
  Filled 2013-12-22: qty 1

## 2013-12-22 MED ORDER — MIDAZOLAM HCL 2 MG/2ML IJ SOLN
INTRAMUSCULAR | Status: AC
  Filled 2013-12-22: qty 4

## 2013-12-22 NOTE — Progress Notes (Signed)
  PROGRESS NOTE  Brandy [name] ZOX:[id number] DOB: [birth year] DOA: 12/17/2013 PCP: Tawanna Solo, MD  Summary: 80 year old woman with history of pancreatitis one episode in the past presented to the emergency department with one-week history of nausea, vomiting, pain with eating, low-grade fever.  Assessment/Plan: 1. Choledocolithiasis.  -She underwent ERCP on 12/19/2013, procedure performed by Dr Benson Norway. IR placing right biliary internal/external drain. She tolerated procedure well no immediate complications. -Patient undergoing fluoroscopic guided angioplasty of sphincter to 3mm with removal of stones. She tolerated procedure well no immediate complications    2. Possible early cholangitis with possible sepsis on admission with hypotension.  -Will transition to oral Cipro and Flagyl  3. Recurrent large ventral hernia without evidence of complicating features. Outpatient referral to general surgery for consideration of elective repair recommended. Code Status: full code DVT prophylaxis: SCDs until procedure complete or ruled out Family Communication: Spoke with husband Disposition Plan: Likely home when improved.   12/22/2013, 11:35 AM  LOS: 5 days   Consultants:  Gastroenterology  Procedures:    Antibiotics:  Ciprofloxacin 6/14 >>   Metronidazole 6/14 >>   HPI/Subjective: Patient seen post procedure, she is in no acute distress, requesting regular diet  Objective: Filed Vitals:   12/22/13 1005 12/22/13 1012 12/22/13 1046 12/22/13 1100  BP: 152/58 122/56 120/44 117/45  Pulse: 62 63 61 59  Temp:   98.7 F (37.1 C) 98.4 F (36.9 C)  TempSrc:   Oral Oral  Resp: 13 12 16 14   Height:      Weight:      SpO2: 96% 93% 97% 96%    Intake/Output Summary (Last 24 hours) at 12/22/13 1135 Last data filed at 12/22/13 0800  Gross per 24 hour  Intake   3000 ml  Output   2520 ml  Net    480 ml     Filed Weights   12/17/13 1740 12/17/13 2345  Weight: 69.854 kg (154 lb)  69.3 kg (152 lb 12.5 oz)    Exam:   Afebrile, hypotensive early this morning. Vitals currently stable. Gen. Appears calm and comfortable. Nontoxic. Psych. Alert. Grossly normal mood and affect. Speech fluent and appropriate. Cardiovascular. Regular rate and rhythm. 2/6 systolic murmur. No rub or gallop. Respiratory. Clear to auscultation bilaterally. No wheezes, rales or rhonchi. Normal respiratory effort. Abdomen. Soft, nontender, nondistended, s/p percutaneous drain placement    Scheduled Meds: . amLODipine  10 mg Oral Daily  . ciprofloxacin  500 mg Oral BID  . citalopram  20 mg Oral Daily  . fentaNYL      . gabapentin  300 mg Oral QHS  . irbesartan  300 mg Oral Daily   And  . hydrochlorothiazide  25 mg Oral Daily  . letrozole  2.5 mg Oral Daily  . metroNIDAZOLE  500 mg Oral 3 times per day  . midazolam      . pantoprazole  40 mg Oral Daily  . simvastatin  20 mg Oral q1800  . zolpidem  5 mg Oral QHS   Continuous Infusions: . sodium chloride 100 mL/hr at 12/21/13 1618  . sodium chloride      Principal Problem:   Choledocholithiasis Active Problems:   Sepsis   Protein-calorie malnutrition, severe   Time spent 20 minutes

## 2013-12-22 NOTE — Sedation Documentation (Addendum)
Patient denies pain and is resting comfortably.  

## 2013-12-22 NOTE — Procedures (Signed)
Successful fluoroscopic guided angioplasty of the biliary sphincter to 8 mm diameter and antegrade pushing of 2 stones within the CBD into the duodenum. Successful fluoroscopic guided exchange and upsizing to a 12 Fr PBD with end coiled and locked within the duodenum.  Biliary drain capped to encourage internal drainage. No immediate post procedural complications.

## 2013-12-22 NOTE — Evaluation (Signed)
Physical Therapy Evaluation Patient Details Name: Brandy [name] MRN: [id number] DOB: [birth year] Today's Date: 12/22/2013   History of Present Illness  Admitted with N/V/abd pain.  Underwent failed MRCP.  Today had  fluoroscopic guided angioplasty of sphincter to 11mm with removal of stones. which was successful.  Clinical Impression  Pt admitted with N/V/abd pain and underwent the above procedures.  Pt currently limited functionally due to the problems listed below.  (see problems list.)  Pt will benefit from PT to maximize function and safety to be able to get home safely with available assist of family.     Follow Up Recommendations No PT follow up;Supervision for mobility/OOB    Equipment Recommendations  None recommended by PT    Recommendations for Other Services       Precautions / Restrictions Precautions Precautions: Fall      Mobility  Bed Mobility Overal bed mobility: Needs Assistance Bed Mobility: Sidelying to Sit   Sidelying to sit: Min assist       General bed mobility comments: suggested side to sit due to increased pain at surgical site  Transfers Overall transfer level: Needs assistance   Transfers: Sit to/from Stand Sit to Stand: Min assist;Min guard         General transfer comment: steady assist  Ambulation/Gait Ambulation/Gait assistance: Min assist Ambulation Distance (Feet): 70 Feet (then addidtional 50 feet back after rest/episode of emesis) Assistive device: 1 person hand held assist Gait Pattern/deviations: Step-through pattern Gait velocity: slow   General Gait Details: mildly unsteady and weak  Stairs Stairs: Yes Stairs assistance: Min assist Stair Management: One rail Right;Alternating pattern;Forwards Number of Stairs: 4 General stair comments: needed min assist to steady and assist of rail  Wheelchair Mobility    Modified Rankin (Stroke Patients Only)       Balance Overall balance assessment: Needs  assistance Sitting-balance support: Feet supported;No upper extremity supported Sitting balance-Leahy Scale: Fair     Standing balance support: No upper extremity supported Standing balance-Leahy Scale: Fair                               Pertinent Vitals/Pain Some discomfort     Home Living Family/patient expects to be discharged to:: Private residence Living Arrangements: Spouse/significant other Available Help at Discharge: Family;Other (Comment) (daughter coming to help) Type of Home: House Home Access: Stairs to enter Entrance Stairs-Rails: Chemical engineer of Steps: 3 Home Layout: One level Home Equipment: Cane - single point;Walker - 2 wheels      Prior Function                 Hand Dominance        Extremity/Trunk Assessment               Lower Extremity Assessment: Generalized weakness;Overall WFL for tasks assessed         Communication      Cognition Arousal/Alertness: Awake/alert Behavior During Therapy: WFL for tasks assessed/performed Overall Cognitive Status: Within Functional Limits for tasks assessed                      General Comments      Exercises        Assessment/Plan    PT Assessment Patent does not need any further PT services  PT Diagnosis     PT Problem List    PT Treatment Interventions     PT Goals (Current  goals can be found in the Care Plan section) Acute Rehab PT Goals PT Goal Formulation: No goals set, d/c therapy    Frequency     Barriers to discharge        Co-evaluation               End of Session   Activity Tolerance: Patient tolerated treatment well;Other (comment) (limited by vomiting) Patient left: in chair;with call bell/phone within reach Nurse Communication: Mobility status         Time: 2536-6440 PT Time Calculation (min): 22 min   Charges:   PT Evaluation $Initial PT Evaluation Tier I: 1 Procedure PT Treatments $Gait Training:  8-22 mins   PT G Codes:          [place] Al[place], Tessie Fass 12/22/2013, 3:48 PM 12/22/2013  Donnella Sham, [place] [phone]  (pager)

## 2013-12-23 LAB — BODY FLUID CULTURE: Gram Stain: NONE SEEN

## 2013-12-23 MED ORDER — CIPROFLOXACIN HCL 500 MG PO TABS: 500.0000 mg | ORAL_TABLET | Freq: Two times a day (BID) | ORAL | Status: DC

## 2013-12-23 MED ORDER — TRAMADOL HCL 50 MG PO TABS: 50.0000 mg | ORAL_TABLET | Freq: Four times a day (QID) | ORAL | Status: DC | PRN

## 2013-12-23 MED ORDER — METRONIDAZOLE 500 MG PO TABS: 500.0000 mg | ORAL_TABLET | Freq: Three times a day (TID) | ORAL | Status: DC

## 2013-12-23 NOTE — Discharge Summary (Signed)
Physician Discharge Summary  ANETT RANKER GYI:[id number] DOB: [birth year] DOA: 12/17/2013  PCP: Tawanna Solo, MD  Admit date: 12/17/2013 Discharge date: 12/23/2013  Time spent: 35 minutes  Recommendations for Outpatient Follow-up:  1. Patient had a biliary drain placed during this hospitalization 2. Follow up on BMP and CBC on hospital follow up  Discharge Diagnoses:  Principal Problem:   Choledocholithiasis Active Problems:   Sepsis   Protein-calorie malnutrition, severe   Discharge Condition: Stable  Diet recommendation: Heart Healthy  Filed Weights   12/17/13 1740 12/17/13 2345  Weight: 69.854 kg (154 lb) 69.3 kg (152 lb 12.5 oz)    History of present illness:  Brandy [name] is a 80 y.o. female h/o BRCA, and a single episode of pancreatitis (unknown cause) a "year or two ago" presents to the ED with intermittent abdominal pain. Pain is associated with N/V, made worse with eating, pain is similar to the pain she experienced in the past with pancreatitis. She denies fever (subjectively, objectively has 100.5 fever). Pain onset 1 week ago and has been going on since then (mostly after eating).  Hospital Course:  Patient is a pleasant 80 year old female with a past medical breast cancer, admitted to the medicine service on 12/18/2013 when she presented with complaints of abdominal pain. Initial workup included a CT scan of her abdomen and pelvis without contrast that showed progressive intra-and extrahepatic biliary dilatation with increased density in the distal common bile duct suspicious for choledocholithiasis. Initial lab work revealed an elevated white count of 13,700. Dr. Benson Norway of gastroenterology was consulted. She underwent ERCP performed on 12/19/2013. Ampulla was identified however could not be cannulized. Dr. Benson Norway utilized both 0.035 and the 0.025 sphinctertomes without any success. He recommended interventional radiology consultation. On 12/20/2013 she underwent  successful placement of right biliary internal/external drain, then on 12/22/2013 she underwent successful fluoroscopic guided angioplasty of the biliary sphincter to 8 mm in diameter with question of 2 stones within the common bile duct into the duodenum. She also had a fluoroscopic guided exchange and upsizing of catheter to a 12 [place]. She tolerated procedure well there are no immediate complications. She was tolerating by mouth intake, remained afebrile as her antibiotic therapy with transition to oral. Physical therapy consulted. She was discharged to her home in stable condition on 12/23/2013 with instructions to followup with Dr. Benson Norway early next week.   Procedures:  ERCP performed on 12/19/2013  Placement of right biliary internal/external drain performed on 12/20/2048  Fluoroscopic guided angioplasty of the biliary sphincter to 8 mm in diameter with a successful fluoroscopic guided exchange and upsizing to 12 French catheter, performed on 12/22/2013  Consultations:  Gastroenterology  Interventional radiology  Discharge Exam: Filed Vitals:   12/23/13 0900  BP: 112/51  Pulse: 64  Temp:   Resp: 16   Afebrile, hypotensive early this morning. Vitals currently stable. Gen. Appears calm and comfortable. Nontoxic.  Psych. Alert. Grossly normal mood and affect. Speech fluent and appropriate.  Cardiovascular. Regular rate and rhythm. 2/6 systolic murmur. No rub or gallop.  Respiratory. Clear to auscultation bilaterally. No wheezes, rales or rhonchi. Normal respiratory effort.  Abdomen. Soft, nontender, nondistended, s/p percutaneous drain placement   Discharge Instructions You were cared for by a hospitalist during your hospital stay. If you have any questions about your discharge medications or the care you received while you were in the hospital after you are discharged, you can call the unit and asked to speak with the hospitalist on call if the  hospitalist that took care of you  is not available. Once you are discharged, your primary care physician will handle any further medical issues. Please note that NO REFILLS for any discharge medications will be authorized once you are discharged, as it is imperative that you return to your primary care physician (or establish a relationship with a primary care physician if you do not have one) for your aftercare needs so that they can reassess your need for medications and monitor your lab values.  Discharge Instructions   Call MD for:  difficulty breathing, headache or visual disturbances    Complete by:  As directed      Call MD for:  extreme fatigue    Complete by:  As directed      Call MD for:  persistant dizziness or light-headedness    Complete by:  As directed      Call MD for:  persistant nausea and vomiting    Complete by:  As directed      Call MD for:  redness, tenderness, or signs of infection (pain, swelling, redness, odor or green/yellow discharge around incision site)    Complete by:  As directed      Call MD for:  temperature >100.4    Complete by:  As directed      Diet - low sodium heart healthy    Complete by:  As directed      Discharge instructions    Complete by:  As directed   Please follow up with Dr Benson Norway next week     Increase activity slowly    Complete by:  As directed             Medication List    STOP taking these medications       zolpidem 10 MG tablet  Commonly known as:  AMBIEN      TAKE these medications       amLODipine 10 MG tablet  Commonly known as:  NORVASC  Take 10 mg by mouth daily.     ciprofloxacin 500 MG tablet  Commonly known as:  CIPRO  Take 1 tablet (500 mg total) by mouth 2 (two) times daily.     citalopram 20 MG tablet  Commonly known as:  CELEXA  Take 20 mg by mouth daily.     gabapentin 100 MG capsule  Commonly known as:  NEURONTIN  Take 300 mg by mouth at bedtime.     letrozole 2.5 MG tablet  Commonly known as:  FEMARA  Take 2.5 mg by mouth  daily.     metroNIDAZOLE 500 MG tablet  Commonly known as:  FLAGYL  Take 1 tablet (500 mg total) by mouth 3 (three) times daily.     omeprazole 20 MG capsule  Commonly known as:  PRILOSEC  Take 20 mg by mouth daily.     simvastatin 20 MG tablet  Commonly known as:  ZOCOR  Take 20 mg by mouth daily.     spironolactone 25 MG tablet  Commonly known as:  ALDACTONE  Take 0.5 tablets (12.5 mg total) by mouth daily.     traMADol 50 MG tablet  Commonly known as:  ULTRAM  Take 1 tablet (50 mg total) by mouth every 6 (six) hours as needed.     valsartan-hydrochlorothiazide 160-12.5 MG per tablet  Commonly known as:  DIOVAN-HCT  Take 2 tablets by mouth daily.       No Known Allergies     Follow-up Information  Follow up with Tawanna Solo, MD In 1 week.   Specialty:  Family Medicine   Contact information:   [place] [id number] [phone]       Follow up with HUNG,PATRICK D, MD In 1 week.   Specialty:  Gastroenterology   Contact information:   [address] [place] [address] [phone]        The results of significant diagnostics from this hospitalization (including imaging, microbiology, ancillary and laboratory) are listed below for reference.    Significant Diagnostic Studies: Ct Abdomen Pelvis Wo Contrast  12/17/2013   CLINICAL DATA:  Intermittent epigastric abdominal pain with nausea and vomiting for 1 week. History of asthma, hypertension and breast cancer. History of kidney stones and pancreatitis.  EXAM: CT ABDOMEN AND PELVIS WITHOUT CONTRAST  TECHNIQUE: Multidetector CT imaging of the abdomen and pelvis was performed following the standard protocol without IV contrast. Study was attempted with intravenous contrast, although IV access was lost.  COMPARISON:  PET-CT 01/04/2012.  Abdominal pelvic CT 09/21/2011.  FINDINGS: The lung bases are clear. There is no pleural or pericardial effusion. Mitral annular calcifications  and a moderate size hiatal hernia are noted.  There is moderate intrahepatic and diffuse extrahepatic biliary dilatation. The common bile duct measures up to 2.2 cm in diameter. There are high-density filling defects within the distal common bile duct, best seen on the reformatted images, suspicious for choledocholithiasis. No pancreatic or ampullary mass is identified. There is no significant dilatation of the pancreatic duct or peripancreatic inflammation. The gallbladder is present and appears normal. No focal liver lesions are seen.  The spleen and adrenal glands appear stable. Both kidneys demonstrate mild cortical thinning with grossly stable cortical and parapelvic cyst formation. There is no hydronephrosis.  There is a large recurrent ventral hernia with left greater than right components. There is herniated small bowel as well as a small portion of the transverse colon. No signs of bowel incarceration or obstruction are seen. There is mild mesenteric edema. No focal extraluminal fluid collections are identified. Sigmoid colon diverticular changes are present.  There is stable aortoiliac atherosclerosis. No ascites or enlarged lymph nodes are seen. There is no evidence of pelvic mass. The ureters and bladder appear normal. There are stable degenerative changes throughout the spine with mild chronic compression deformities in the lower thoracic region. No acute osseous findings or metastases are identified.  IMPRESSION: 1. Progressive intra and extrahepatic biliary dilatation with increased density in the distal common bile duct suspicious for choledocholithiasis. MRCP may be helpful for confirmation and to exclude an ampullary lesion. 2. Recurrent large ventral hernia containing portions of the small bowel and transverse colon. No signs of bowel incarceration or obstruction. 3. No evidence of metastatic disease.   Electronically Signed   By: Camie Patience M.D.   On: 12/17/2013 20:23   Ir Cholan Exist  Tube  12/22/2013   INDICATION: Choledocholithiasis and ampullary stricture. Post failed attempt at ERCP decompression. Post percutaneous biliary drainage catheter placement performed 12/20/2013. Patient presents to the interventional radiology suite for attempted biliary sphincter angioplasty and percutaneous stone extraction.  EXAM: 1. PERCUTANEOUS TRANSHEPATIC CHOLANGIOGRAM THE EXISTING PERCUTANEOUS BILIARY CATHETER. 2. FLUOROSCOPIC GUIDED BILIARY SPHINCTER ANGIOPLASTY 3. FLUOROSCOPIC GUIDED ANTEGRADE BILIARY STONE EXTRACTION. 4. FLUOROSCOPIC GUIDED PERCUTANEOUS BILIARY DRAINAGE CATHETER EXCHANGE AND UP SIZING  COMPARISON:  CT the abdomen pelvis - 12/17/2013; MRCP - 12/18/2013; fluoroscopic guided right-sided percutaneous biliary drainage catheter placement -12/19/2018  MEDICATIONS: Versed said 3 mg IV; fentanyl 100  mcg IV; the patient is currently admitted to the hospital and receiving intravenous antibiotics. The antibiotics were administered within an appropriate time frame prior to the initiation of the procedure.  CONTRAST:  40 mL OMNIPAQUE IOHEXOL 300 MG/ML SOLN - administered into the biliary tree  FLUOROSCOPY TIME:  13 minutes 42 seconds.  COMPLICATIONS: None immediate  TECHNIQUE: Informed written consent was obtained from the patient after a discussion of the risks, benefits and alternatives to treatment. Questions regarding the procedure were encouraged and answered. A timeout was performed prior to the initiation of the procedure.  The external portion of the right-sided biliary drainage catheter as well as the surrounding skin were prepped and draped in the usual sterile fashion, and a sterile drape was applied covering the operative field. Maximum barrier sterile technique with sterile gowns and gloves were used for the procedure. A timeout was performed prior to the initiation of the procedure. Local anesthesia was provided with 1% lidocaine.  Initial plan geographic images were obtained via the  existing percutaneous biliary drainage catheter.  A stiff Amplatz wire was advanced through the percutaneous biliary drainage catheter and coiled within the proximal small bowel. Over the Amplatz wire, the existing biliary drainage catheter was exchanged for a radiopaque tip 8 [place] vascular sheath which was advanced to the level of the biliary hilum. Repeat cholangiogram was performed via the vascular sheath. The vascular sheath was then advanced into the duodenum and a pull-back sheath cholangiogram was performed.  With the use of a Kumpe catheter, a stiff Glidewire was advanced into the proximal small bowel. Over the stiff Glidewire, a 6 mm, 4 cm Conquest balloon was utilized to angioplasty the biliary sphincter along side the stiff Amplatz wire which served as both a safety wire and for cutting purposes. This was followed by several rounds of biliary sweeping with an occlusion balloon from the level of the central proper hepatic duct to the level of the duodenum. Multiple spot fluoroscopic and radiographic images were obtained.  Repeat cholangiogram was performed to the vascular sheath and demonstrated persistent stasis of flow within the markedly dilated common bile duct.  As such, another round of prolonged balloon angioplasty was performed at multiple stations at the level of the biliary stent there with an 8 mm x 4 cm Conquest balloon. This was followed by several additional rounds of biliary sweeping with the occlusion balloon.  A repeat sheath cholangiogram was performed.  Under intermittent fluoroscopic guidance, the vascular sheath was exchanged for a new, slightly larger 12 French percutaneous biliary drainage catheter with end ultimately coiled and locked within the proximal duodenum. Limited postprocedural contrast injection was performed and the procedure was terminated.  The percutaneous biliary drainage catheter was secured at the skin exit site within interrupted suture. The biliary system was  temporarily decompressed to a gravity bag and subsequently the biliary drainage catheter was flushed with saline and capped.  A dressing was placed. The patient tolerated the procedure well without immediate postprocedural complication.  FINDINGS: Initial cholangiogram performed via the existing percutaneous biliary drainage catheter demonstrated persistent marked dilatation of the common bile duct without definitive opacification of the duodenum. There is an ill-defined filling defect within the mid/distal aspect of the CBD which likely correlates with the stone demonstrated on initial percutaneous biliary drainage catheter placement.  Preprocedural sheath cholangiogram demonstrated a high-grade stenosis involving the distal aspect of the CBD at the level of the biliary sphincter.  Under fluoroscopic guidance, the biliary sphincter was sequential knee angioplastied, initially  to 6 mm and ultimately to 8 mm diameter with multiple intervening rounds of biliary sweeping with an occlusion balloon.  Completion sheath cholangiogram failed to delineate any residual opacities within the persistent markedly dilated common bile duct. There is a persistent high-grade stenosis involving the biliary sphincter though contrast is noted to pass from the CBD into the duodenum.  Successful fluoroscopic guided exchange and up sizing of a now 59 French percutaneous biliary drainage catheter with and coiled and locked within the proximal duodenum. Post exchange cholangiogram demonstrated brisk passage through the biliary drainage catheter with opacification of the proximal duodenum.  IMPRESSION: 1. Technically successful fluoroscopic guided angioplasty of the biliary sphincteric ultimately to 8 mm diameter. 2. Technically successful antegrade extraction of biliary stones into the proximal duodenum. 3. Completion sheath cholangiogram demonstrated a persistent high-grade stenosis of the biliary sphincter and as such, a new, slightly  larger 48 [place] biliary drainage catheter was placed with end coiled and locked within the duodenum. Above findings discussed with Dr. Benson Norway at the time of procedure completion.  PLAN: - The biliary drainage catheter will be capped to encourage normal antegrade flow from the biliary system into the duodenum, however the patient was given a new gravity bag if she were to develop recurrent right upper quadrant abdominal pain.  - Would recommend continued daily CMP to evaluate liver function while the patient remains admitted to the hospital. - The patient will return next week (6/26 at [facility]) for repeat LFTs, cholangiogram and potential repeat biliary angioplasty.   Electronically Signed   By: Sandi Mariscal M.D.   On: 12/22/2013 12:17   Ir Bil Duct Post Op T-tube  12/22/2013   INDICATION: Choledocholithiasis and ampullary stricture. Post failed attempt at ERCP decompression. Post percutaneous biliary drainage catheter placement performed 12/20/2013. Patient presents to the interventional radiology suite for attempted biliary sphincter angioplasty and percutaneous stone extraction.  EXAM: 1. PERCUTANEOUS TRANSHEPATIC CHOLANGIOGRAM THE EXISTING PERCUTANEOUS BILIARY CATHETER. 2. FLUOROSCOPIC GUIDED BILIARY SPHINCTER ANGIOPLASTY 3. FLUOROSCOPIC GUIDED ANTEGRADE BILIARY STONE EXTRACTION. 4. FLUOROSCOPIC GUIDED PERCUTANEOUS BILIARY DRAINAGE CATHETER EXCHANGE AND UP SIZING  COMPARISON:  CT the abdomen pelvis - 12/17/2013; MRCP - 12/18/2013; fluoroscopic guided right-sided percutaneous biliary drainage catheter placement -12/19/2018  MEDICATIONS: Versed said 3 mg IV; fentanyl 100 mcg IV; the patient is currently admitted to the hospital and receiving intravenous antibiotics. The antibiotics were administered within an appropriate time frame prior to the initiation of the procedure.  CONTRAST:  40 mL OMNIPAQUE IOHEXOL 300 MG/ML SOLN - administered into the biliary tree  FLUOROSCOPY TIME:  13 minutes 42 seconds.   COMPLICATIONS: None immediate  TECHNIQUE: Informed written consent was obtained from the patient after a discussion of the risks, benefits and alternatives to treatment. Questions regarding the procedure were encouraged and answered. A timeout was performed prior to the initiation of the procedure.  The external portion of the right-sided biliary drainage catheter as well as the surrounding skin were prepped and draped in the usual sterile fashion, and a sterile drape was applied covering the operative field. Maximum barrier sterile technique with sterile gowns and gloves were used for the procedure. A timeout was performed prior to the initiation of the procedure. Local anesthesia was provided with 1% lidocaine.  Initial plan geographic images were obtained via the existing percutaneous biliary drainage catheter.  A stiff Amplatz wire was advanced through the percutaneous biliary drainage catheter and coiled within the proximal small bowel. Over the Amplatz wire, the existing biliary drainage catheter was  exchanged for a radiopaque tip 8 [place] vascular sheath which was advanced to the level of the biliary hilum. Repeat cholangiogram was performed via the vascular sheath. The vascular sheath was then advanced into the duodenum and a pull-back sheath cholangiogram was performed.  With the use of a Kumpe catheter, a stiff Glidewire was advanced into the proximal small bowel. Over the stiff Glidewire, a 6 mm, 4 cm Conquest balloon was utilized to angioplasty the biliary sphincter along side the stiff Amplatz wire which served as both a safety wire and for cutting purposes. This was followed by several rounds of biliary sweeping with an occlusion balloon from the level of the central proper hepatic duct to the level of the duodenum. Multiple spot fluoroscopic and radiographic images were obtained.  Repeat cholangiogram was performed to the vascular sheath and demonstrated persistent stasis of flow within the markedly  dilated common bile duct.  As such, another round of prolonged balloon angioplasty was performed at multiple stations at the level of the biliary stent there with an 8 mm x 4 cm Conquest balloon. This was followed by several additional rounds of biliary sweeping with the occlusion balloon.  A repeat sheath cholangiogram was performed.  Under intermittent fluoroscopic guidance, the vascular sheath was exchanged for a new, slightly larger 12 French percutaneous biliary drainage catheter with end ultimately coiled and locked within the proximal duodenum. Limited postprocedural contrast injection was performed and the procedure was terminated.  The percutaneous biliary drainage catheter was secured at the skin exit site within interrupted suture. The biliary system was temporarily decompressed to a gravity bag and subsequently the biliary drainage catheter was flushed with saline and capped.  A dressing was placed. The patient tolerated the procedure well without immediate postprocedural complication.  FINDINGS: Initial cholangiogram performed via the existing percutaneous biliary drainage catheter demonstrated persistent marked dilatation of the common bile duct without definitive opacification of the duodenum. There is an ill-defined filling defect within the mid/distal aspect of the CBD which likely correlates with the stone demonstrated on initial percutaneous biliary drainage catheter placement.  Preprocedural sheath cholangiogram demonstrated a high-grade stenosis involving the distal aspect of the CBD at the level of the biliary sphincter.  Under fluoroscopic guidance, the biliary sphincter was sequential knee angioplastied, initially to 6 mm and ultimately to 8 mm diameter with multiple intervening rounds of biliary sweeping with an occlusion balloon.  Completion sheath cholangiogram failed to delineate any residual opacities within the persistent markedly dilated common bile duct. There is a persistent  high-grade stenosis involving the biliary sphincter though contrast is noted to pass from the CBD into the duodenum.  Successful fluoroscopic guided exchange and up sizing of a now 76 French percutaneous biliary drainage catheter with and coiled and locked within the proximal duodenum. Post exchange cholangiogram demonstrated brisk passage through the biliary drainage catheter with opacification of the proximal duodenum.  IMPRESSION: 1. Technically successful fluoroscopic guided angioplasty of the biliary sphincteric ultimately to 8 mm diameter. 2. Technically successful antegrade extraction of biliary stones into the proximal duodenum. 3. Completion sheath cholangiogram demonstrated a persistent high-grade stenosis of the biliary sphincter and as such, a new, slightly larger 41 [place] biliary drainage catheter was placed with end coiled and locked within the duodenum. Above findings discussed with Dr. Benson Norway at the time of procedure completion.  PLAN: - The biliary drainage catheter will be capped to encourage normal antegrade flow from the biliary system into the duodenum, however the patient was given a new gravity  bag if she were to develop recurrent right upper quadrant abdominal pain.  - Would recommend continued daily CMP to evaluate liver function while the patient remains admitted to the hospital. - The patient will return next week (6/26 at [facility]) for repeat LFTs, cholangiogram and potential repeat biliary angioplasty.   Electronically Signed   By: Sandi Mariscal M.D.   On: 12/22/2013 12:17   Ir Biliary Dilitation  12/22/2013   INDICATION: Choledocholithiasis and ampullary stricture. Post failed attempt at ERCP decompression. Post percutaneous biliary drainage catheter placement performed 12/20/2013. Patient presents to the interventional radiology suite for attempted biliary sphincter angioplasty and percutaneous stone extraction.  EXAM: 1. PERCUTANEOUS TRANSHEPATIC CHOLANGIOGRAM THE EXISTING  PERCUTANEOUS BILIARY CATHETER. 2. FLUOROSCOPIC GUIDED BILIARY SPHINCTER ANGIOPLASTY 3. FLUOROSCOPIC GUIDED ANTEGRADE BILIARY STONE EXTRACTION. 4. FLUOROSCOPIC GUIDED PERCUTANEOUS BILIARY DRAINAGE CATHETER EXCHANGE AND UP SIZING  COMPARISON:  CT the abdomen pelvis - 12/17/2013; MRCP - 12/18/2013; fluoroscopic guided right-sided percutaneous biliary drainage catheter placement -12/19/2018  MEDICATIONS: Versed said 3 mg IV; fentanyl 100 mcg IV; the patient is currently admitted to the hospital and receiving intravenous antibiotics. The antibiotics were administered within an appropriate time frame prior to the initiation of the procedure.  CONTRAST:  40 mL OMNIPAQUE IOHEXOL 300 MG/ML SOLN - administered into the biliary tree  FLUOROSCOPY TIME:  13 minutes 42 seconds.  COMPLICATIONS: None immediate  TECHNIQUE: Informed written consent was obtained from the patient after a discussion of the risks, benefits and alternatives to treatment. Questions regarding the procedure were encouraged and answered. A timeout was performed prior to the initiation of the procedure.  The external portion of the right-sided biliary drainage catheter as well as the surrounding skin were prepped and draped in the usual sterile fashion, and a sterile drape was applied covering the operative field. Maximum barrier sterile technique with sterile gowns and gloves were used for the procedure. A timeout was performed prior to the initiation of the procedure. Local anesthesia was provided with 1% lidocaine.  Initial plan geographic images were obtained via the existing percutaneous biliary drainage catheter.  A stiff Amplatz wire was advanced through the percutaneous biliary drainage catheter and coiled within the proximal small bowel. Over the Amplatz wire, the existing biliary drainage catheter was exchanged for a radiopaque tip 8 [place] vascular sheath which was advanced to the level of the biliary hilum. Repeat cholangiogram was performed via  the vascular sheath. The vascular sheath was then advanced into the duodenum and a pull-back sheath cholangiogram was performed.  With the use of a Kumpe catheter, a stiff Glidewire was advanced into the proximal small bowel. Over the stiff Glidewire, a 6 mm, 4 cm Conquest balloon was utilized to angioplasty the biliary sphincter along side the stiff Amplatz wire which served as both a safety wire and for cutting purposes. This was followed by several rounds of biliary sweeping with an occlusion balloon from the level of the central proper hepatic duct to the level of the duodenum. Multiple spot fluoroscopic and radiographic images were obtained.  Repeat cholangiogram was performed to the vascular sheath and demonstrated persistent stasis of flow within the markedly dilated common bile duct.  As such, another round of prolonged balloon angioplasty was performed at multiple stations at the level of the biliary stent there with an 8 mm x 4 cm Conquest balloon. This was followed by several additional rounds of biliary sweeping with the occlusion balloon.  A repeat sheath cholangiogram was performed.  Under intermittent fluoroscopic guidance, the  vascular sheath was exchanged for a new, slightly larger 12 French percutaneous biliary drainage catheter with end ultimately coiled and locked within the proximal duodenum. Limited postprocedural contrast injection was performed and the procedure was terminated.  The percutaneous biliary drainage catheter was secured at the skin exit site within interrupted suture. The biliary system was temporarily decompressed to a gravity bag and subsequently the biliary drainage catheter was flushed with saline and capped.  A dressing was placed. The patient tolerated the procedure well without immediate postprocedural complication.  FINDINGS: Initial cholangiogram performed via the existing percutaneous biliary drainage catheter demonstrated persistent marked dilatation of the common bile  duct without definitive opacification of the duodenum. There is an ill-defined filling defect within the mid/distal aspect of the CBD which likely correlates with the stone demonstrated on initial percutaneous biliary drainage catheter placement.  Preprocedural sheath cholangiogram demonstrated a high-grade stenosis involving the distal aspect of the CBD at the level of the biliary sphincter.  Under fluoroscopic guidance, the biliary sphincter was sequential knee angioplastied, initially to 6 mm and ultimately to 8 mm diameter with multiple intervening rounds of biliary sweeping with an occlusion balloon.  Completion sheath cholangiogram failed to delineate any residual opacities within the persistent markedly dilated common bile duct. There is a persistent high-grade stenosis involving the biliary sphincter though contrast is noted to pass from the CBD into the duodenum.  Successful fluoroscopic guided exchange and up sizing of a now 59 French percutaneous biliary drainage catheter with and coiled and locked within the proximal duodenum. Post exchange cholangiogram demonstrated brisk passage through the biliary drainage catheter with opacification of the proximal duodenum.  IMPRESSION: 1. Technically successful fluoroscopic guided angioplasty of the biliary sphincteric ultimately to 8 mm diameter. 2. Technically successful antegrade extraction of biliary stones into the proximal duodenum. 3. Completion sheath cholangiogram demonstrated a persistent high-grade stenosis of the biliary sphincter and as such, a new, slightly larger 61 [place] biliary drainage catheter was placed with end coiled and locked within the duodenum. Above findings discussed with Dr. Benson Norway at the time of procedure completion.  PLAN: - The biliary drainage catheter will be capped to encourage normal antegrade flow from the biliary system into the duodenum, however the patient was given a new gravity bag if she were to develop recurrent right  upper quadrant abdominal pain.  - Would recommend continued daily CMP to evaluate liver function while the patient remains admitted to the hospital. - The patient will return next week (6/26 at [facility]) for repeat LFTs, cholangiogram and potential repeat biliary angioplasty.   Electronically Signed   By: Sandi Mariscal M.D.   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 The biliary system could not be decompressed with ERCP.  EXAM: PERCUTANEOUS TRANSHEPATIC CHOLANGIOGRAM; PLACEMENT OF INTERNAL/EXTERNAL BILIARY DRAIN;  ULTRASOUND GUIDANCE  Physician: Stephan Minister. Henn, MD  FLUOROSCOPY TIME:  6 min  MEDICATIONS: 3 mg versed, 87.5 mcg fentanyl. A radiology nurse monitored the patient for moderate sedation. 400 mg ciprofloxacin. Ciprofloxacin was given within two hours of incision.  ANESTHESIA/SEDATION: Moderate sedation time: 30 min  PROCEDURE: The procedure was explained to the patient. The risks and benefits of the procedure were discussed and the patient's questions were addressed. Informed consent was obtained from the patient. The right side of the abdomen was prepped and draped in sterile fashion. Maximal barrier sterile technique was utilized including caps, mask,  sterile gowns, sterile gloves, sterile drape, hand hygiene and skin antiseptic. Ultrasound was used to evaluate the right hepatic lobe and identify the gallbladder. The peripheral ducts were not dilated by ultrasound. Ultrasound was used to localize the right hepatic lobe and the location of the gallbladder. Right intercostal area was anesthetized with 1% lidocaine. A 21 gauge needle was directed into the right hepatic lobe with fluoroscopy. Contrast was slowly injected as the needle was withdrawn. The biliary system was opacified with contrast under fluoroscopy. Needle was positioned within a small branch. A 0.018 wire was advanced into the  biliary system. An Accustick dilator set was placed. Contrast was injected and confirmed placement in the biliary system. Accustick dilator set was exchanged for a 5 [place] Kumpe catheter. The 5 [place] Kumpe catheter was advanced into the duodenum with a Glidewire. A J wire was placed. The tract was dilated and 10 [place] biliary drain was placed. Contrast injection confirmed placement in the biliary system. Catheter was sutured to the skin and attached to gravity bag. Bile sample was sent for culture.  FINDINGS: The extrahepatic biliary system and central intrahepatic ducts were dilated. The peripheral ducts were not dilated. The initial cholangiogram demonstrated a distal common bile duct obstruction and no drainage into the duodenum. There was filling the cystic duct and gallbladder. At least two filling defects in the distal common bile duct are compatible with stones. Small right hepatic branch was cannulated and used for percutaneous access. Catheter was easily advanced beyond the ampulla. 10 French catheter was placed in the duodenum.  COMPLICATIONS: None  IMPRESSION: Percutaneous transhepatic cholangiogram demonstrated a distal common bile duct obstruction. At least two large filling defects in the common bile duct are consistent with stones.  Successful placement of an internal/external biliary drain.   Electronically Signed   By: Markus Daft M.D.   On: 12/20/2013 18:07   Ir Catheter Tube Change  12/22/2013   INDICATION: Choledocholithiasis and ampullary stricture. Post failed attempt at ERCP decompression. Post percutaneous biliary drainage catheter placement performed 12/20/2013. Patient presents to the interventional radiology suite for attempted biliary sphincter angioplasty and percutaneous stone extraction.  EXAM: 1. PERCUTANEOUS TRANSHEPATIC CHOLANGIOGRAM THE EXISTING PERCUTANEOUS BILIARY CATHETER. 2. FLUOROSCOPIC GUIDED BILIARY SPHINCTER ANGIOPLASTY 3. FLUOROSCOPIC GUIDED ANTEGRADE BILIARY STONE  EXTRACTION. 4. FLUOROSCOPIC GUIDED PERCUTANEOUS BILIARY DRAINAGE CATHETER EXCHANGE AND UP SIZING  COMPARISON:  CT the abdomen pelvis - 12/17/2013; MRCP - 12/18/2013; fluoroscopic guided right-sided percutaneous biliary drainage catheter placement -12/19/2018  MEDICATIONS: Versed said 3 mg IV; fentanyl 100 mcg IV; the patient is currently admitted to the hospital and receiving intravenous antibiotics. The antibiotics were administered within an appropriate time frame prior to the initiation of the procedure.  CONTRAST:  40 mL OMNIPAQUE IOHEXOL 300 MG/ML SOLN - administered into the biliary tree  FLUOROSCOPY TIME:  13 minutes 42 seconds.  COMPLICATIONS: None immediate  TECHNIQUE: Informed written consent was obtained from the patient after a discussion of the risks, benefits and alternatives to treatment. Questions regarding the procedure were encouraged and answered. A timeout was performed prior to the initiation of the procedure.  The external portion of the right-sided biliary drainage catheter as well as the surrounding skin were prepped and draped in the usual sterile fashion, and a sterile drape was applied covering the operative field. Maximum barrier sterile technique with sterile gowns and gloves were used for the procedure. A timeout was performed prior to the initiation of the procedure. Local anesthesia was provided with 1% lidocaine.  Initial plan  geographic images were obtained via the existing percutaneous biliary drainage catheter.  A stiff Amplatz wire was advanced through the percutaneous biliary drainage catheter and coiled within the proximal small bowel. Over the Amplatz wire, the existing biliary drainage catheter was exchanged for a radiopaque tip 8 [place] vascular sheath which was advanced to the level of the biliary hilum. Repeat cholangiogram was performed via the vascular sheath. The vascular sheath was then advanced into the duodenum and a pull-back sheath cholangiogram was performed.   With the use of a Kumpe catheter, a stiff Glidewire was advanced into the proximal small bowel. Over the stiff Glidewire, a 6 mm, 4 cm Conquest balloon was utilized to angioplasty the biliary sphincter along side the stiff Amplatz wire which served as both a safety wire and for cutting purposes. This was followed by several rounds of biliary sweeping with an occlusion balloon from the level of the central proper hepatic duct to the level of the duodenum. Multiple spot fluoroscopic and radiographic images were obtained.  Repeat cholangiogram was performed to the vascular sheath and demonstrated persistent stasis of flow within the markedly dilated common bile duct.  As such, another round of prolonged balloon angioplasty was performed at multiple stations at the level of the biliary stent there with an 8 mm x 4 cm Conquest balloon. This was followed by several additional rounds of biliary sweeping with the occlusion balloon.  A repeat sheath cholangiogram was performed.  Under intermittent fluoroscopic guidance, the vascular sheath was exchanged for a new, slightly larger 12 French percutaneous biliary drainage catheter with end ultimately coiled and locked within the proximal duodenum. Limited postprocedural contrast injection was performed and the procedure was terminated.  The percutaneous biliary drainage catheter was secured at the skin exit site within interrupted suture. The biliary system was temporarily decompressed to a gravity bag and subsequently the biliary drainage catheter was flushed with saline and capped.  A dressing was placed. The patient tolerated the procedure well without immediate postprocedural complication.  FINDINGS: Initial cholangiogram performed via the existing percutaneous biliary drainage catheter demonstrated persistent marked dilatation of the common bile duct without definitive opacification of the duodenum. There is an ill-defined filling defect within the mid/distal aspect of the  CBD which likely correlates with the stone demonstrated on initial percutaneous biliary drainage catheter placement.  Preprocedural sheath cholangiogram demonstrated a high-grade stenosis involving the distal aspect of the CBD at the level of the biliary sphincter.  Under fluoroscopic guidance, the biliary sphincter was sequential knee angioplastied, initially to 6 mm and ultimately to 8 mm diameter with multiple intervening rounds of biliary sweeping with an occlusion balloon.  Completion sheath cholangiogram failed to delineate any residual opacities within the persistent markedly dilated common bile duct. There is a persistent high-grade stenosis involving the biliary sphincter though contrast is noted to pass from the CBD into the duodenum.  Successful fluoroscopic guided exchange and up sizing of a now 11 French percutaneous biliary drainage catheter with and coiled and locked within the proximal duodenum. Post exchange cholangiogram demonstrated brisk passage through the biliary drainage catheter with opacification of the proximal duodenum.  IMPRESSION: 1. Technically successful fluoroscopic guided angioplasty of the biliary sphincteric ultimately to 8 mm diameter. 2. Technically successful antegrade extraction of biliary stones into the proximal duodenum. 3. Completion sheath cholangiogram demonstrated a persistent high-grade stenosis of the biliary sphincter and as such, a new, slightly larger 77 [place] biliary drainage catheter was placed with end coiled and locked within the duodenum. Above  findings discussed with Dr. Benson Norway at the time of procedure completion.  PLAN: - The biliary drainage catheter will be capped to encourage normal antegrade flow from the biliary system into the duodenum, however the patient was given a new gravity bag if she were to develop recurrent right upper quadrant abdominal pain.  - Would recommend continued daily CMP to evaluate liver function while the patient remains admitted to  the hospital. - The patient will return next week (6/26 at [facility]) for repeat LFTs, cholangiogram and potential repeat biliary angioplasty.   Electronically Signed   By: Sandi Mariscal M.D.   On: 12/22/2013 12:17   Mr Abdomen Mrcp Wo Cm  12/18/2013   CLINICAL DATA:  Biliary ductal dilatation. Nausea and vomiting. Personal history of pancreatitis, breast carcinoma, and lymphoma.  EXAM: MRI ABDOMEN WITHOUT  (INCLUDING MRCP)  TECHNIQUE: Multiplanar multisequence MR imaging of the abdomen was performed. Heavily T2-weighted images of the biliary and pancreatic ducts were obtained, and three-dimensional MRCP images were rendered by post processing.  COMPARISON:  Noncontrast CT on 12/17/2013  FINDINGS: Diffuse biliary ductal dilatation seen, with proximal common bile duct measuring 17 mm in diameter. A smooth stricture is seen involving the intrapancreatic portion of the distal common bile duct which has a benign appearance. No pancreatic mass visualized on this noncontrast study and there is no evidence of pancreatic ductal dilatation. At least 2 large calculi are seen in the distal common bile duct measuring approximately 10 mm and 11 mm in diameter. Gallbladder is unremarkable in appearance.  Noncontrast images of the liver, spleen, adrenal glands are normal in appearance. Small bilateral renal parenchymal cysts and left sided parapelvic cysts are noted, without definite evidence of renal mass or hydronephrosis on this noncontrast study.  Small hiatal hernia noted. No evidence of abdominal lymphadenopathy. No other inflammatory process or abnormal fluid collections seen within the abdomen. Visualized abdominal bowel loops are nondilated.  IMPRESSION: Diffuse biliary ductal dilatation, with choledocholithiasis and benign appearing distal common bile duct stricture.  No evidence of pancreatic mass or pancreatic ductal dilatation.   Electronically Signed   By: Earle Gell M.D.   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 The biliary system could not be decompressed with ERCP.  EXAM: PERCUTANEOUS TRANSHEPATIC CHOLANGIOGRAM; PLACEMENT OF INTERNAL/EXTERNAL BILIARY DRAIN;  ULTRASOUND GUIDANCE  Physician: Stephan Minister. Henn, MD  FLUOROSCOPY TIME:  6 min  MEDICATIONS: 3 mg versed, 87.5 mcg fentanyl. A radiology nurse monitored the patient for moderate sedation. 400 mg ciprofloxacin. Ciprofloxacin was given within two hours of incision.  ANESTHESIA/SEDATION: Moderate sedation time: 30 min  PROCEDURE: The procedure was explained to the patient. The risks and benefits of the procedure were discussed and the patient's questions were addressed. Informed consent was obtained from the patient. The right side of the abdomen was prepped and draped in sterile fashion. Maximal barrier sterile technique was utilized including caps, mask, sterile gowns, sterile gloves, sterile drape, hand hygiene and skin antiseptic. Ultrasound was used to evaluate the right hepatic lobe and identify the gallbladder. The peripheral ducts were not dilated by ultrasound. Ultrasound was used to localize the right hepatic lobe and the location of the gallbladder. Right intercostal area was anesthetized with 1% lidocaine. A 21 gauge needle was directed into the right hepatic lobe with fluoroscopy. Contrast was slowly injected as the needle was withdrawn. The biliary system was opacified with contrast under fluoroscopy.  Needle was positioned within a small branch. A 0.018 wire was advanced into the biliary system. An Accustick dilator set was placed. Contrast was injected and confirmed placement in the biliary system. Accustick dilator set was exchanged for a 5 [place] Kumpe catheter. The 5 [place] Kumpe catheter was advanced into the duodenum with a Glidewire. A J wire was placed. The tract was dilated and 10 [place] biliary drain was placed. Contrast injection confirmed placement in the  biliary system. Catheter was sutured to the skin and attached to gravity bag. Bile sample was sent for culture.  FINDINGS: The extrahepatic biliary system and central intrahepatic ducts were dilated. The peripheral ducts were not dilated. The initial cholangiogram demonstrated a distal common bile duct obstruction and no drainage into the duodenum. There was filling the cystic duct and gallbladder. At least two filling defects in the distal common bile duct are compatible with stones. Small right hepatic branch was cannulated and used for percutaneous access. Catheter was easily advanced beyond the ampulla. 10 French catheter was placed in the duodenum.  COMPLICATIONS: None  IMPRESSION: Percutaneous transhepatic cholangiogram demonstrated a distal common bile duct obstruction. At least two large filling defects in the common bile duct are consistent with stones.  Successful placement of an internal/external biliary drain.   Electronically Signed   By: Markus Daft M.D.   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 The biliary system could not be decompressed with ERCP.  EXAM: PERCUTANEOUS TRANSHEPATIC CHOLANGIOGRAM; PLACEMENT OF INTERNAL/EXTERNAL BILIARY DRAIN;  ULTRASOUND GUIDANCE  Physician: Stephan Minister. Henn, MD  FLUOROSCOPY TIME:  6 min  MEDICATIONS: 3 mg versed, 87.5 mcg fentanyl. A radiology nurse monitored the patient for moderate sedation. 400 mg ciprofloxacin. Ciprofloxacin was given within two hours of incision.  ANESTHESIA/SEDATION: Moderate sedation time: 30 min  PROCEDURE: The procedure was explained to the patient. The risks and benefits of the procedure were discussed and the patient's questions were addressed. Informed consent was obtained from the patient. The right side of the abdomen was prepped and draped in sterile fashion. Maximal barrier sterile technique was utilized including caps, mask, sterile gowns,  sterile gloves, sterile drape, hand hygiene and skin antiseptic. Ultrasound was used to evaluate the right hepatic lobe and identify the gallbladder. The peripheral ducts were not dilated by ultrasound. Ultrasound was used to localize the right hepatic lobe and the location of the gallbladder. Right intercostal area was anesthetized with 1% lidocaine. A 21 gauge needle was directed into the right hepatic lobe with fluoroscopy. Contrast was slowly injected as the needle was withdrawn. The biliary system was opacified with contrast under fluoroscopy. Needle was positioned within a small branch. A 0.018 wire was advanced into the biliary system. An Accustick dilator set was placed. Contrast was injected and confirmed placement in the biliary system. Accustick dilator set was exchanged for a 5 [place] Kumpe catheter. The 5 [place] Kumpe catheter was advanced into the duodenum with a Glidewire. A J wire was placed. The tract was dilated and 10 [place] biliary drain was placed. Contrast injection confirmed placement in the biliary system. Catheter was sutured to the skin and attached to gravity bag. Bile sample was sent for culture.  FINDINGS: The extrahepatic biliary system and central intrahepatic ducts were dilated. The peripheral ducts were not dilated. The initial cholangiogram demonstrated a distal common bile duct obstruction and no drainage into the duodenum. There was filling  the cystic duct and gallbladder. At least two filling defects in the distal common bile duct are compatible with stones. Small right hepatic branch was cannulated and used for percutaneous access. Catheter was easily advanced beyond the ampulla. 10 French catheter was placed in the duodenum.  COMPLICATIONS: None  IMPRESSION: Percutaneous transhepatic cholangiogram demonstrated a distal common bile duct obstruction. At least two large filling defects in the common bile duct are consistent with stones.  Successful placement of an  internal/external biliary drain.   Electronically Signed   By: Markus Daft M.D.   On: 12/20/2013 18:07    Microbiology: Recent Results (from the past 240 hour(s))  CULTURE, BLOOD (ROUTINE X 2)     Status: None   Collection Time    12/18/13 11:10 AM      Result Value Ref Range Status   Specimen Description BLOOD RIGHT HAND   Final   Special Requests BOTTLES DRAWN AEROBIC AND ANAEROBIC 10CC   Final   Culture  Setup Time     Final   Value: 12/18/2013 16:54     Performed at [facility]   Culture     Final   Value:        BLOOD CULTURE RECEIVED NO GROWTH TO DATE CULTURE WILL BE HELD FOR 5 DAYS BEFORE ISSUING A FINAL NEGATIVE REPORT     Performed at [facility]   Report Status PENDING   Incomplete  CULTURE, BLOOD (ROUTINE X 2)     Status: None   Collection Time    12/18/13 11:20 AM      Result Value Ref Range Status   Specimen Description BLOOD RIGHT WRIST   Final   Special Requests BOTTLES DRAWN AEROBIC AND ANAEROBIC 10CC   Final   Culture  Setup Time     Final   Value: 12/18/2013 16:54     Performed at [facility]   Culture     Final   Value:        BLOOD CULTURE RECEIVED NO GROWTH TO DATE CULTURE WILL BE HELD FOR 5 DAYS BEFORE ISSUING A FINAL NEGATIVE REPORT     Performed at [facility]   Report Status PENDING   Incomplete  BODY FLUID CULTURE     Status: None   Collection Time    12/20/13  3:53 PM      Result Value Ref Range Status   Specimen Description BILE   Final   Special Requests NONE   Final   Gram Stain     Final   Value: NO WBC SEEN     NO ORGANISMS SEEN     Performed at [facility]   Culture     Final   Value: Culture reincubated for better growth     Performed at [facility]   Report Status PENDING   Incomplete     Labs: Basic Metabolic Panel:  Recent Labs Lab 12/17/13 1805 12/18/13 0736 12/19/13 0545 12/22/13 0754  NA 137 138 140 139  K 3.9 4.1 4.0 3.7  CL 100 104 107 106  CO2 _0 GLUCOSE 124* 135* 96 123*  BUN 23 24* 13 6  CREATININE 0.70 0.97 0.61 0.57  CALCIUM 8.8 7.6* 7.4* 7.1*   Liver Function Tests:  Recent Labs Lab 12/17/13 1805 12/18/13 0736 12/19/13 0545 12/22/13 0754  AST 64* 71* 30 12  ALT 55* 69* 48* 17  ALKPHOS 100 108 101 72  BILITOT 1.7*  2.1* 0.6 0.3  PROT 7.6 6.0 6.1 6.0  ALBUMIN 3.5 2.6* 2.5* 2.6*    Recent Labs Lab 12/17/13 1805  LIPASE 16   No results found for this basename: AMMONIA,  in the last 168 hours CBC:  Recent Labs Lab 12/17/13 1805 12/18/13 0736 12/19/13 0545 12/22/13 0754  WBC 14.4* 13.7* 8.6 7.4  NEUTROABS 11.3*  --   --  5.2  HGB 12.4 10.1* 9.7* 11.0*  HCT 37.3 30.7* 30.0* 33.8*  MCV 88.4 87.2 87.7 86.7  PLT 303 234 235 284   Cardiac Enzymes:  Recent Labs Lab 12/17/13 1805  TROPONINI <0.30   BNP: BNP (last 3 results) No results found for this basename: PROBNP,  in the last 8760 hours CBG: No results found for this basename: GLUCAP,  in the last 168 hours     Signed:  Kelvin Cellar  [facility] 12/23/2013, 11:28 AM

## 2013-12-23 NOTE — Progress Notes (Signed)
Discharge to home. Home discharge instructions given.  No questions verbalized.

## 2013-12-24 LAB — CULTURE, BLOOD (ROUTINE X 2)
CULTURE: NO GROWTH
Culture: NO GROWTH

## 2013-12-25 ENCOUNTER — Other Ambulatory Visit (HOSPITAL_COMMUNITY): Payer: Self-pay | Admitting: Interventional Radiology

## 2013-12-25 DIAGNOSIS — K8050 Calculus of bile duct without cholangitis or cholecystitis without obstruction: Secondary | ICD-10-CM

## 2013-12-27 ENCOUNTER — Encounter (HOSPITAL_COMMUNITY): Payer: Self-pay | Admitting: Pharmacy Technician

## 2013-12-28 ENCOUNTER — Other Ambulatory Visit: Payer: Self-pay | Admitting: Radiology

## 2013-12-29 ENCOUNTER — Ambulatory Visit (HOSPITAL_COMMUNITY)
Admission: RE | Admit: 2013-12-29 | Discharge: 2013-12-29 | Disposition: A | Discharge status: Home / Self Care | Payer: Medicare HMO | Source: Ambulatory Visit | Attending: Interventional Radiology | Admitting: Interventional Radiology

## 2013-12-29 ENCOUNTER — Encounter (HOSPITAL_COMMUNITY): Payer: Self-pay

## 2013-12-29 VITALS — BP 118/60 | HR 67 | Temp 98.9°F | Resp 16 | Ht 60.0 in | Wt 152.0 lb

## 2013-12-29 DIAGNOSIS — Z9010 Acquired absence of unspecified breast and nipple: Secondary | ICD-10-CM | POA: Insufficient documentation

## 2013-12-29 DIAGNOSIS — I10 Essential (primary) hypertension: Secondary | ICD-10-CM | POA: Insufficient documentation

## 2013-12-29 DIAGNOSIS — K831 Obstruction of bile duct: Secondary | ICD-10-CM | POA: Insufficient documentation

## 2013-12-29 DIAGNOSIS — E785 Hyperlipidemia, unspecified: Secondary | ICD-10-CM | POA: Insufficient documentation

## 2013-12-29 DIAGNOSIS — Z853 Personal history of malignant neoplasm of breast: Secondary | ICD-10-CM | POA: Insufficient documentation

## 2013-12-29 DIAGNOSIS — F329 Major depressive disorder, single episode, unspecified: Secondary | ICD-10-CM | POA: Insufficient documentation

## 2013-12-29 DIAGNOSIS — F3289 Other specified depressive episodes: Secondary | ICD-10-CM | POA: Insufficient documentation

## 2013-12-29 DIAGNOSIS — K219 Gastro-esophageal reflux disease without esophagitis: Secondary | ICD-10-CM | POA: Insufficient documentation

## 2013-12-29 DIAGNOSIS — J45909 Unspecified asthma, uncomplicated: Secondary | ICD-10-CM | POA: Insufficient documentation

## 2013-12-29 DIAGNOSIS — K8050 Calculus of bile duct without cholangitis or cholecystitis without obstruction: Secondary | ICD-10-CM

## 2013-12-29 DIAGNOSIS — Z79899 Other long term (current) drug therapy: Secondary | ICD-10-CM | POA: Insufficient documentation

## 2013-12-29 DIAGNOSIS — Z923 Personal history of irradiation: Secondary | ICD-10-CM | POA: Insufficient documentation

## 2013-12-29 LAB — PROTIME-INR
INR: 0.99 (ref 0.00–1.49)
PROTHROMBIN TIME: 13.1 s (ref 11.6–15.2)

## 2013-12-29 LAB — CBC WITH DIFFERENTIAL/PLATELET
BASOS PCT: 1 % (ref 0–1)
Basophils Absolute: 0.1 10*3/uL (ref 0.0–0.1)
EOS PCT: 2 % (ref 0–5)
Eosinophils Absolute: 0.2 10*3/uL (ref 0.0–0.7)
HCT: 33.4 % — ABNORMAL LOW (ref 36.0–46.0)
Hemoglobin: 10.9 g/dL — ABNORMAL LOW (ref 12.0–15.0)
LYMPHS ABS: 2.2 10*3/uL (ref 0.7–4.0)
Lymphocytes Relative: 24 % (ref 12–46)
MCH: 28.3 pg (ref 26.0–34.0)
MCHC: 32.6 g/dL (ref 30.0–36.0)
MCV: 86.8 fL (ref 78.0–100.0)
Monocytes Absolute: 1 10*3/uL (ref 0.1–1.0)
Monocytes Relative: 11 % (ref 3–12)
Neutro Abs: 5.5 10*3/uL (ref 1.7–7.7)
Neutrophils Relative %: 62 % (ref 43–77)
Platelets: 398 10*3/uL (ref 150–400)
RBC: 3.85 MIL/uL — AB (ref 3.87–5.11)
RDW: 14.4 % (ref 11.5–15.5)
WBC: 8.9 10*3/uL (ref 4.0–10.5)

## 2013-12-29 LAB — COMPREHENSIVE METABOLIC PANEL
ALBUMIN: 3.1 g/dL — AB (ref 3.5–5.2)
ALT: 11 U/L (ref 0–35)
AST: 14 U/L (ref 0–37)
Alkaline Phosphatase: 72 U/L (ref 39–117)
BUN: 11 mg/dL (ref 6–23)
CALCIUM: 8.8 mg/dL (ref 8.4–10.5)
CO2: 25 mEq/L (ref 19–32)
Chloride: 99 mEq/L (ref 96–112)
Creatinine, Ser: 0.59 mg/dL (ref 0.50–1.10)
GFR calc non Af Amer: 86 mL/min — ABNORMAL LOW (ref >90)
GLUCOSE: 121 mg/dL — AB (ref 70–99)
POTASSIUM: 3.3 meq/L — AB (ref 3.7–5.3)
Sodium: 138 mEq/L (ref 137–147)
TOTAL PROTEIN: 7 g/dL (ref 6.0–8.3)
Total Bilirubin: 0.3 mg/dL (ref 0.3–1.2)

## 2013-12-29 MED ORDER — IOHEXOL 300 MG/ML  SOLN
25.0000 mL | Freq: Once | INTRAMUSCULAR | Status: AC | PRN
  Administered 2013-12-29: 25 mL

## 2013-12-29 MED ORDER — CIPROFLOXACIN IN D5W 400 MG/200ML IV SOLN: 400.0000 mg | Freq: Once | INTRAVENOUS | Status: DC

## 2013-12-29 MED ORDER — SODIUM CHLORIDE 0.9 % IV SOLN
INTRAVENOUS | Status: DC
  Administered 2013-12-29: 07:00:00 via INTRAVENOUS

## 2013-12-29 MED ORDER — IOHEXOL 300 MG/ML  SOLN: 25.0000 mL | Freq: Once | INTRAMUSCULAR | Status: DC | PRN

## 2013-12-29 NOTE — H&P (Signed)
Chief Complaint: "I am here for a procedure with my drain." HPI: Brandy [name] is an 80 y.o. female with ampullary stricture and choledocholithiasis. S/p right external/internal biliary catheter placed with sphincteroplasty on 12/22/13. The patient has had her drain capped without complaints of RUQ pain, fever, chills or change in LFT's. She is scheduled here today for image guided cholangiogram with possible biliary catheter exchange/repeat sphincteroplasty. She denies any chest pain, shortness of breath or palpitations. She denies any active signs of bleeding or excessive bruising. The patient denies any history of sleep apnea or chronic oxygen use. She has previously tolerated sedation without complications.    Past Medical History:  Past Medical History  Diagnosis Date  . Asthma   . Hyperlipidemia     takes Simvastatin daily  . Hypertension     takes Amlodipine and Diovan daily  . Cancer     left breast  . Bronchitis     hx of;last time >42yrago  . Arthritis     shoulders  . Dysphagia   . H/O hiatal hernia   . GERD (gastroesophageal reflux disease)     takes Prilosec prn  . Urinary urgency   . History of kidney stones     many yrs ago  . Depression     takes Celexa daily  . Insomnia     takes Ambien nightly  . Breast cancer 12/18/11    left breast masectomy=metastatic ca in (1/1) lymph node ,invasive ductal ca,2 foci,,dcis,lymph ovascular invasion identified,surgical resection margins neg for ca,additional tissue=benign skin and subcutaneous tissue  . Breast CA 11/23/11    left breast 3 o'clock bx=high grade ductal ca in situ w/comedononecrosis and calcification,dcis,lymphovascular invasion present ,ER?PR+positive  . Full dentures   . Hx of radiation therapy 04/26/12 -06/10/12    left breast    Past Surgical History:  Past Surgical History  Procedure Laterality Date  . Breast biopsy  1998    left  . Total shoulder replacement  2011    left  . Eus  06/04/2011   Procedure: UPPER ENDOSCOPIC ULTRASOUND (EUS) LINEAR;  Surgeon: DOwens Loffler MD;  Location: [facility];  Service: Endoscopy;  Laterality: N/A;  . Exploratory laparotomy       biopsy of intra-abdominal mass  . Bladder tack    . Tubal ligation    . Appendectomy    . Dilation and curettage of uterus    . Esophagogastroduodenoscopy    . Mastectomy w/ sentinel node biopsy  12/18/2011    Procedure: MASTECTOMY WITH SENTINEL LYMPH NODE BIOPSY;  Surgeon: MRolm Bookbinder MD;  [facility]  Service: General;  Laterality: Left;  . Portacath placement  01/27/2012    Procedure: INSERTION PORT-A-CATH;  Surgeon: MRolm Bookbinder MD;  [facility]  Service: General;  Laterality: Right;  PORT PLACEMENT  . Port-a-cath removal Right 06/15/2013    Procedure: REMOVAL PORT-A-CATH;  Surgeon: MRolm Bookbinder MD;  [facility] [facility]  Service: General;  Laterality: Right;  . Esophagogastroduodenoscopy (egd) with propofol  12/19/2013    Procedure: ESOPHAGOGASTRODUODENOSCOPY (EGD) WITH PROPOFOL;  Surgeon: PBeryle Beams MD;  [facility];  Service: Endoscopy;;    Family History:  Family History  Problem Relation Age of Onset  . Cancer Father     prostate  . Cancer Other     breast ca    Social History:  reports that she has never smoked. She has never used smokeless tobacco. She reports that she does not  drink alcohol or use illicit drugs.  Allergies: No Known Allergies  Medications:   Medication List    ASK your doctor about these medications       amLODipine 10 MG tablet  Commonly known as:  NORVASC  Take 10 mg by mouth every morning.     ciprofloxacin 500 MG tablet  Commonly known as:  CIPRO  Take 1 tablet (500 mg total) by mouth 2 (two) times daily.     citalopram 20 MG tablet  Commonly known as:  CELEXA  Take 20 mg by mouth every morning.     gabapentin 100 MG capsule  Commonly known as:  NEURONTIN  Take 300 mg by mouth at  bedtime.     letrozole 2.5 MG tablet  Commonly known as:  FEMARA  Take 2.5 mg by mouth daily.     metroNIDAZOLE 500 MG tablet  Commonly known as:  FLAGYL  Take 1 tablet (500 mg total) by mouth 3 (three) times daily.     omeprazole 20 MG capsule  Commonly known as:  PRILOSEC  Take 20 mg by mouth daily.     simvastatin 20 MG tablet  Commonly known as:  ZOCOR  Take 20 mg by mouth daily.     spironolactone 25 MG tablet  Commonly known as:  ALDACTONE  Take 0.5 tablets (12.5 mg total) by mouth daily.     traMADol 50 MG tablet  Commonly known as:  ULTRAM  Take 1 tablet (50 mg total) by mouth every 6 (six) hours as needed.     valsartan-hydrochlorothiazide 160-12.5 MG per tablet  Commonly known as:  DIOVAN-HCT  Take 1 tablet by mouth every morning.     zolpidem 10 MG tablet  Commonly known as:  AMBIEN  Take 10 mg by mouth at bedtime.       Please HPI for pertinent positives, otherwise complete 10 system ROS negative.  Physical Exam: BP 127/56  Pulse 69  Temp(Src) 98.9 F (37.2 C) (Oral)  Resp 18  Ht 5' (1.524 m)  Wt 152 lb (68.947 kg)  BMI 29.69 kg/m2  SpO2 97% Body mass index is 29.69 kg/(m^2).  General Appearance:  Alert, cooperative, no distress  Head:  Normocephalic, without obvious abnormality, atraumatic  Neck: Supple, symmetrical, trachea midline  Lungs:   Clear to auscultation bilaterally, no w/r/r, respirations unlabored without use of accessory muscles.  Chest Wall:  No tenderness or deformity  Heart:  Regular rate and rhythm, S1, S2 normal, no murmur, rub or gallop.  Abdomen:   Soft, non-tender, non distended, (+) BS, RUQ biliary drain dressing C/D/I, no signs of leakage or bleeding, drain capped.  Extremities: Extremities normal, atraumatic, no cyanosis or edema  Neurologic: Normal affect, no gross deficits.   Results for orders placed during the hospital encounter of 12/29/13 (from the past 48 hour(s))  CBC WITH DIFFERENTIAL     Status: Abnormal    Collection Time    12/29/13  7:15 AM      Result Value Ref Range   WBC 8.9  4.0 - 10.5 K/uL   RBC 3.85 (*) 3.87 - 5.11 MIL/uL   Hemoglobin 10.9 (*) 12.0 - 15.0 g/dL   HCT 33.4 (*) 36.0 - 46.0 %   MCV 86.8  78.0 - 100.0 fL   MCH 28.3  26.0 - 34.0 pg   MCHC 32.6  30.0 - 36.0 g/dL   RDW 14.4  11.5 - 15.5 %   Platelets 398  150 - 400 K/uL  Neutrophils Relative % 62  43 - 77 %   Neutro Abs 5.5  1.7 - 7.7 K/uL   Lymphocytes Relative 24  12 - 46 %   Lymphs Abs 2.2  0.7 - 4.0 K/uL   Monocytes Relative 11  3 - 12 %   Monocytes Absolute 1.0  0.1 - 1.0 K/uL   Eosinophils Relative 2  0 - 5 %   Eosinophils Absolute 0.2  0.0 - 0.7 K/uL   Basophils Relative 1  0 - 1 %   Basophils Absolute 0.1  0.0 - 0.1 K/uL  COMPREHENSIVE METABOLIC PANEL     Status: Abnormal   Collection Time    12/29/13  7:15 AM      Result Value Ref Range   Sodium 138  137 - 147 mEq/L   Potassium 3.3 (*) 3.7 - 5.3 mEq/L   Chloride 99  96 - 112 mEq/L   CO2 25  19 - 32 mEq/L   Glucose, Bld 121 (*) 70 - 99 mg/dL   BUN 11  6 - 23 mg/dL   Creatinine, Ser 0.59  0.50 - 1.10 mg/dL   Calcium 8.8  8.4 - 10.5 mg/dL   Total Protein 7.0  6.0 - 8.3 g/dL   Albumin 3.1 (*) 3.5 - 5.2 g/dL   AST 14  0 - 37 U/L   ALT 11  0 - 35 U/L   Alkaline Phosphatase 72  39 - 117 U/L   Total Bilirubin 0.3  0.3 - 1.2 mg/dL   GFR calc non Af Amer 86 (*) >90 mL/min   GFR calc Af Amer >90  >90 mL/min   Comment: (NOTE)     The eGFR has been calculated using the CKD EPI equation.     This calculation has not been validated in all clinical situations.     eGFR's persistently <90 mL/min signify possible Chronic Kidney     Disease.  PROTIME-INR     Status: None   Collection Time    12/29/13  7:15 AM      Result Value Ref Range   Prothrombin Time 13.1  11.6 - 15.2 seconds   INR 0.99  0.00 - 1.49   No results found.  Assessment/Plan Ampullary stricture with choledocholithiasis  S/p right external/internal biliary catheter placed with  sphincteroplasty Patient has been capped to external drainage without complaints or change in LFT's Scheduled today for image guided cholangiogram with possible biliary catheter exchange/repeat sphincteroplasty Patient has been NPO, labs reviewed Risks and Benefits discussed with the patient. All of the patient's questions were answered, patient is agreeable to proceed. Consent signed and in chart.   Tsosie Billing D PA-C 12/29/2013, 8:27 AM

## 2013-12-29 NOTE — Progress Notes (Signed)
Call to spouse to come and pick up patient Report to spouse that X-ray only today. No change in diet, activity or medications.

## 2013-12-29 NOTE — Procedures (Signed)
Percutaneous cholangiogram demonstrates apparent removal of previously noted stones within the CBD. There is a persistent narrowing involving the distal aspect of the CBD, but contrast passes freely through the PBD into the duodenum. No exchange performed. PBD capped.

## 2013-12-29 NOTE — Progress Notes (Signed)
No sedation given no treatment to tubes S-Rays only.

## 2013-12-29 NOTE — Discharge Instructions (Signed)
Mrs. Battaglia had X-Rays only today will leave tube in for now. No change in diet, medications or activity. Doctor will be calling you later to see what needs to be done next.

## 2014-01-01 ENCOUNTER — Telehealth: Payer: Self-pay

## 2014-01-01 ENCOUNTER — Encounter: Payer: Self-pay | Admitting: *Deleted

## 2014-01-01 NOTE — Telephone Encounter (Signed)
Pt scheduled to see Dr. Ardis Hughs tomorrow afternoon at 2:45pm. Pt aware of appt.

## 2014-01-01 NOTE — Telephone Encounter (Signed)
Message copied by Algernon Huxley on Mon Jan 01, 2014  9:08 AM ------      Message from: Owens Loffler P      Created: Mon Jan 01, 2014  7:51 AM       Thanks Ulice Dash,            Chong Sicilian,      Can we get her in the office tomorrow AM or PM (double book a spot if needed).  To discuss recent hosp cases, consider further testing, procedures.  Thanks                  ----- Message -----         From: Sandi Mariscal, MD         Sent: 12/29/2013   3:57 PM           To: [name], MD            Dorina Hoyer you are well.            This is a reminder for the pt, Eddis Pingleton that we discussed earlier today.            My dictations for last week's stone extraction and biliary sphinecteroplasty and today's cholangiogram will be in her chart.              As we discussed, you were going to consider performing another ERCP following our biliary drain removal versus a simple drain removal.            Thanks,      Ulice Dash       ------

## 2014-01-02 ENCOUNTER — Encounter: Payer: Self-pay | Admitting: Gastroenterology

## 2014-01-02 ENCOUNTER — Ambulatory Visit (INDEPENDENT_AMBULATORY_CARE_PROVIDER_SITE_OTHER): Payer: Medicare HMO | Admitting: Gastroenterology

## 2014-01-02 VITALS — BP 120/58 | HR 72 | Ht 60.0 in | Wt 154.0 lb

## 2014-01-02 DIAGNOSIS — R932 Abnormal findings on diagnostic imaging of liver and biliary tract: Secondary | ICD-10-CM

## 2014-01-02 NOTE — Patient Instructions (Addendum)
We will coordinate with interventional radiology a combined, rendezvous biliary procedure on July 30th (thursday). This will include endoscopic ultrasound to evaluate for pancreatic masses, bile duct disease. I will also ask the interventional radiologist to place a wire through your existing biliary drain that I may access for ERCP, perhaps biliary stent placement, distal bile duct stricture sampling. I would like to do this in early August.

## 2014-01-02 NOTE — Progress Notes (Signed)
Review of pertinent gastrointestinal problems: 1. peripancreatic mass, unclear etiology. 2012, 2013 she had a 5-6 cm mass involving the head of her pancreas. This appeared malignant however her CA 19-9 was normal. To endoscopic ultrasound with fine-needle aspirations were done neither showing neoplasm. She underwent surgical biopsy of the area and this also failed to show neoplasm. Followup scans 2013, 2014 showed complete resolution of this process. It was felt to be inflammatory, perhaps infectious after all was said and done. 2. biliary obstruction June, 2015; total bilirubin 2, elevated alkaline phosphatase, CAT scan showed dilated intra-and extrahepatic bile duct. There was a high suspicion of gallstones in her distal bile duct. ERCP attempt by Dr. Daphene Calamity was unsuccessful. Eventual interventional radiology placement of biliary drain was performed. They documented removal of filling defects in the bile duct there were consistent with gallstones. Repeat drain study, showed continued "high-grade stricture" and distal bile duct. Internal/external drain remained, this has been capped.  HPI: [name] whom I last saw several years ago.  She was recently in the hospital with biliary obstruction that was felt to be gallstone related. She has no gallstones in her existing gallbladder however.  She currently has a right-sided biliary drain placed it causes mild discomfort but is functioning very well with normal liver tests late last week.  She has lost 5-10 pounds over past few months.  No abd pains.  Had previously been having vomiting.     Review of systems: Pertinent positive and negative review of systems were noted in the above HPI section. Complete review of systems was performed and was otherwise normal.    Past Medical History  Diagnosis Date  . Asthma   . Hyperlipidemia     takes Simvastatin daily  . Hypertension     takes Amlodipine and  Diovan daily  . Cancer     left breast  . Bronchitis     hx of;last time >44yr ago  . Arthritis     shoulders  . Dysphagia   . H/O hiatal hernia   . GERD (gastroesophageal reflux disease)     takes Prilosec prn  . Urinary urgency   . History of kidney stones     many yrs ago  . Depression     takes Celexa daily  . Insomnia     takes Ambien nightly  . Breast cancer 12/18/11    left breast masectomy=metastatic ca in (1/1) lymph node ,invasive ductal ca,2 foci,,dcis,lymph ovascular invasion identified,surgical resection margins neg for ca,additional tissue=benign skin and subcutaneous tissue  . Breast CA 11/23/11    left breast 3 o'clock bx=high grade ductal ca in situ w/comedononecrosis and calcification,dcis,lymphovascular invasion present ,ER?PR+positive  . Full dentures   . Hx of radiation therapy 04/26/12 -06/10/12    left breast    Past Surgical History  Procedure Laterality Date  . Breast biopsy  1998    left  . Total shoulder replacement  2011    left  . Eus  06/04/2011    Procedure: UPPER ENDOSCOPIC ULTRASOUND (EUS) LINEAR;  Surgeon: Brandy Loffler, MD;  Location: [facility];  Service: Endoscopy;  Laterality: N/A;  . Exploratory laparotomy       biopsy of intra-abdominal mass  . Bladder tack    . Tubal ligation    . Appendectomy    . Dilation and curettage of uterus    . Esophagogastroduodenoscopy    . Mastectomy w/ sentinel node biopsy  12/18/2011  Procedure: MASTECTOMY WITH SENTINEL LYMPH NODE BIOPSY;  Surgeon: Brandy Bookbinder, MD;  Location: [place];  Service: General;  Laterality: Left;  . Portacath placement  01/27/2012    Procedure: INSERTION PORT-A-CATH;  Surgeon: Brandy Bookbinder, MD;  Location: [place];  Service: General;  Laterality: Right;  PORT PLACEMENT  . Port-a-cath removal Right 06/15/2013    Procedure: REMOVAL PORT-A-CATH;  Surgeon: Brandy Bookbinder, MD;  Location: [place];  Service: General;  Laterality: Right;   . Esophagogastroduodenoscopy (egd) with propofol  12/19/2013    Procedure: ESOPHAGOGASTRODUODENOSCOPY (EGD) WITH PROPOFOL;  Surgeon: Beryle Beams, MD;  Location: [place];  Service: Endoscopy;;    Current Outpatient Prescriptions  Medication Sig Dispense Refill  . amLODipine (NORVASC) 10 MG tablet Take 10 mg by mouth every morning.       . ciprofloxacin (CIPRO) 500 MG tablet Take 1 tablet (500 mg total) by mouth 2 (two) times daily.  10 tablet  0  . citalopram (CELEXA) 20 MG tablet Take 20 mg by mouth every morning.       . gabapentin (NEURONTIN) 100 MG capsule Take 300 mg by mouth at bedtime.      Marland Kitchen letrozole (FEMARA) 2.5 MG tablet Take 2.5 mg by mouth daily.      . metroNIDAZOLE (FLAGYL) 500 MG tablet Take 1 tablet (500 mg total) by mouth 3 (three) times daily.  15 tablet  0  . omeprazole (PRILOSEC) 20 MG capsule Take 20 mg by mouth daily.      . simvastatin (ZOCOR) 20 MG tablet Take 20 mg by mouth daily.       Marland Kitchen spironolactone (ALDACTONE) 25 MG tablet Take 0.5 tablets (12.5 mg total) by mouth daily.  30 tablet  6  . traMADol (ULTRAM) 50 MG tablet Take 1 tablet (50 mg total) by mouth every 6 (six) hours as needed.  12 tablet  0  . valsartan-hydrochlorothiazide (DIOVAN-HCT) 160-12.5 MG per tablet Take 1 tablet by mouth every morning.       . zolpidem (AMBIEN) 10 MG tablet Take 10 mg by mouth at bedtime.       No current facility-administered medications for this visit.   Facility-Administered Medications Ordered in Other Visits  Medication Dose Route Frequency Brandy [name] Last Rate Last Dose  . sodium chloride 0.9 % injection 10 mL  10 mL Intracatheter PRN Brandy Robinson, MD   10 mL at 03/14/12 1628    Allergies as of 01/02/2014  . (No Known Allergies)    Family History  Problem Relation Age of Onset  . Prostate cancer Father   . Breast cancer Other     History   Social History  . Marital Status: Married    Spouse Name: N/A    Number of Children: N/A  . Years of  Education: N/A   Occupational History  . Not on file.   Social History Main Topics  . Smoking status: Never Smoker   . Smokeless tobacco: Never Used  . Alcohol Use: No  . Drug Use: No  . Sexual Activity: Yes    Birth Control/ Protection: Surgical     Comment: mensus age 37, 37st pregnancy 25, no hrt gg4,p3, 1 babay lived a few hours complications   Other Topics Concern  . Not on file   Social History Narrative  . No narrative on file       Physical Exam: BP 120/58  Pulse 72  Ht 5' (1.524 m)  Wt 154 lb [phone]  kg)  BMI 30.08 kg/m2 Constitutional: generally well-appearing Psychiatric: alert and oriented x3 Eyes: extraocular movements intact Mouth: oral pharynx moist, no lesions Neck: supple no lymphadenopathy Cardiovascular: heart regular rate and rhythm Lungs: clear to auscultation bilaterally Abdomen: soft, nontender, nondistended, no obvious ascites, no peritoneal signs, normal bowel sounds Extremities: no lower extremity edema bilaterally Skin: no lesions on visible extremities    Assessment and plan: 80 y.o. female with  complex pancreatic or biliary history, recent biliary obstruction  Biliary cholangiograms show a very tight stricture in her distal bile duct. This is in the region of her pancreas. She had previous neoplasm like imaging of her pancreatic head however obviously she did not pancreatic adenocarcinoma. See the results of 2012 testing summarized above. Perhaps there was an inflammatory process and it has left her with chronic pancreatitis with resultant scarring of her pancreas, biliary structure and. She has lost 10-12 pounds in the past several months. She currently has a internal/external biliary drain is capped.  I am concerned that if she simply has her drain pulled she will have biliary obstruction shortly thereafter. The reason I am concerned about that is the very tight nature of the stricture seen on PTC. We will try to coordinate with  interventional radiology placing a wire through the biliary drain that I may access in a retrograde fashion with endoscopic ultrasound and probable also ERCP same day. Radiology had said the 31 French drain should usually stay in place for another few weeks, we will aim for July 30 which is a Thursday.

## 2014-01-04 ENCOUNTER — Other Ambulatory Visit (HOSPITAL_COMMUNITY): Payer: Self-pay | Admitting: Interventional Radiology

## 2014-01-04 ENCOUNTER — Telehealth: Payer: Self-pay | Admitting: Gastroenterology

## 2014-01-04 ENCOUNTER — Other Ambulatory Visit: Payer: Self-pay

## 2014-01-04 ENCOUNTER — Encounter: Payer: Self-pay | Admitting: Gastroenterology

## 2014-01-04 DIAGNOSIS — K831 Obstruction of bile duct: Secondary | ICD-10-CM

## 2014-01-04 DIAGNOSIS — R932 Abnormal findings on diagnostic imaging of liver and biliary tract: Secondary | ICD-10-CM

## 2014-01-04 DIAGNOSIS — K8689 Other specified diseases of pancreas: Secondary | ICD-10-CM

## 2014-01-04 NOTE — Telephone Encounter (Signed)
EUS scheduled, pt instructed and medications reviewed.  Patient instructions mailed to home.  Patient to call with any questions or concerns.  

## 2014-01-04 NOTE — Telephone Encounter (Signed)
Patty, I spoke with Dr. Jarvis Newcomer from IR about coordinating schedules for later this month. Can you call Tiffany at [facility] IR, would like her to have EUS, ERCP at [facility] with MAC sedation.  IR will need to be present at beginning of the case to place wire through the drain, sheath.  Dr. Jarvis Newcomer feels they can do their part in [facility] endo.  Can you book the case at endo (thursday July 30th) , then discuss with Jonelle Sidle so that she arranges for one of the docs is available at the beginning of the case to come to endo.  Thanks

## 2014-01-10 ENCOUNTER — Encounter (HOSPITAL_COMMUNITY): Payer: Self-pay | Admitting: Pharmacy Technician

## 2014-01-19 ENCOUNTER — Encounter (HOSPITAL_COMMUNITY): Payer: Self-pay | Admitting: *Deleted

## 2014-02-01 ENCOUNTER — Encounter (HOSPITAL_COMMUNITY)
Admission: RE | Disposition: A | Discharge status: Home / Self Care | Payer: Medicare HMO | Source: Ambulatory Visit | Attending: Gastroenterology

## 2014-02-01 ENCOUNTER — Encounter (HOSPITAL_COMMUNITY): Payer: Self-pay | Admitting: *Deleted

## 2014-02-01 ENCOUNTER — Ambulatory Visit (HOSPITAL_COMMUNITY): Payer: Medicare HMO

## 2014-02-01 ENCOUNTER — Ambulatory Visit (HOSPITAL_COMMUNITY)
Admission: RE | Admit: 2014-02-01 | Discharge: 2014-02-01 | Disposition: A | Discharge status: Home / Self Care | Payer: Medicare HMO | Source: Ambulatory Visit | Attending: Interventional Radiology | Admitting: Interventional Radiology

## 2014-02-01 ENCOUNTER — Ambulatory Visit (HOSPITAL_COMMUNITY)
Admission: RE | Admit: 2014-02-01 | Discharge: 2014-02-01 | Disposition: A | Discharge status: Home / Self Care | Payer: Medicare HMO | Source: Ambulatory Visit | Attending: Gastroenterology | Admitting: Gastroenterology

## 2014-02-01 ENCOUNTER — Encounter (HOSPITAL_COMMUNITY): Payer: Medicare HMO | Admitting: Anesthesiology

## 2014-02-01 ENCOUNTER — Ambulatory Visit (HOSPITAL_COMMUNITY): Payer: Medicare HMO | Admitting: Anesthesiology

## 2014-02-01 ENCOUNTER — Telehealth: Payer: Self-pay

## 2014-02-01 DIAGNOSIS — J45909 Unspecified asthma, uncomplicated: Secondary | ICD-10-CM | POA: Insufficient documentation

## 2014-02-01 DIAGNOSIS — Z9010 Acquired absence of unspecified breast and nipple: Secondary | ICD-10-CM | POA: Insufficient documentation

## 2014-02-01 DIAGNOSIS — K449 Diaphragmatic hernia without obstruction or gangrene: Secondary | ICD-10-CM | POA: Insufficient documentation

## 2014-02-01 DIAGNOSIS — Z853 Personal history of malignant neoplasm of breast: Secondary | ICD-10-CM | POA: Diagnosis not present

## 2014-02-01 DIAGNOSIS — R932 Abnormal findings on diagnostic imaging of liver and biliary tract: Secondary | ICD-10-CM

## 2014-02-01 DIAGNOSIS — F3289 Other specified depressive episodes: Secondary | ICD-10-CM | POA: Diagnosis not present

## 2014-02-01 DIAGNOSIS — K831 Obstruction of bile duct: Secondary | ICD-10-CM | POA: Diagnosis present

## 2014-02-01 DIAGNOSIS — R933 Abnormal findings on diagnostic imaging of other parts of digestive tract: Secondary | ICD-10-CM

## 2014-02-01 DIAGNOSIS — K838 Other specified diseases of biliary tract: Secondary | ICD-10-CM | POA: Diagnosis not present

## 2014-02-01 DIAGNOSIS — F329 Major depressive disorder, single episode, unspecified: Secondary | ICD-10-CM | POA: Diagnosis not present

## 2014-02-01 DIAGNOSIS — K219 Gastro-esophageal reflux disease without esophagitis: Secondary | ICD-10-CM | POA: Insufficient documentation

## 2014-02-01 DIAGNOSIS — K8689 Other specified diseases of pancreas: Secondary | ICD-10-CM

## 2014-02-01 DIAGNOSIS — I10 Essential (primary) hypertension: Secondary | ICD-10-CM | POA: Diagnosis not present

## 2014-02-01 HISTORY — PX: EUS: SHX5427

## 2014-02-01 HISTORY — DX: Unspecified asthma, uncomplicated: J45.909

## 2014-02-01 HISTORY — DX: Calculus of gallbladder without cholecystitis without obstruction: K80.20

## 2014-02-01 HISTORY — PX: ENDOSCOPIC RETROGRADE CHOLANGIOPANCREATOGRAPHY (ERCP) WITH PROPOFOL: SHX5810

## 2014-02-01 SURGERY — UPPER ENDOSCOPIC ULTRASOUND (EUS) LINEAR
Anesthesia: General

## 2014-02-01 MED ORDER — ONDANSETRON HCL 4 MG/2ML IJ SOLN
INTRAMUSCULAR | Status: AC
  Filled 2014-02-01: qty 2

## 2014-02-01 MED ORDER — GLUCAGON HCL RDNA (DIAGNOSTIC) 1 MG IJ SOLR
INTRAMUSCULAR | Status: AC
  Filled 2014-02-01: qty 2

## 2014-02-01 MED ORDER — SUCCINYLCHOLINE CHLORIDE 20 MG/ML IJ SOLN
INTRAMUSCULAR | Status: DC | PRN
  Administered 2014-02-01: 100 mg via INTRAVENOUS

## 2014-02-01 MED ORDER — CIPROFLOXACIN IN D5W 400 MG/200ML IV SOLN
INTRAVENOUS | Status: DC | PRN
  Administered 2014-02-01: 400 mg via INTRAVENOUS

## 2014-02-01 MED ORDER — LIDOCAINE HCL (CARDIAC) 20 MG/ML IV SOLN
INTRAVENOUS | Status: AC
  Filled 2014-02-01: qty 5

## 2014-02-01 MED ORDER — LACTATED RINGERS IV SOLN
INTRAVENOUS | Status: DC
  Administered 2014-02-01: 08:00:00 via INTRAVENOUS

## 2014-02-01 MED ORDER — PROMETHAZINE HCL 25 MG/ML IJ SOLN: 6.2500 mg | INTRAMUSCULAR | Status: DC | PRN

## 2014-02-01 MED ORDER — ONDANSETRON HCL 4 MG/2ML IJ SOLN
INTRAMUSCULAR | Status: DC | PRN
  Administered 2014-02-01: 4 mg via INTRAVENOUS

## 2014-02-01 MED ORDER — LIDOCAINE HCL (CARDIAC) 20 MG/ML IV SOLN
INTRAVENOUS | Status: DC | PRN
  Administered 2014-02-01: 100 mg via INTRAVENOUS

## 2014-02-01 MED ORDER — CIPROFLOXACIN IN D5W 400 MG/200ML IV SOLN
INTRAVENOUS | Status: AC
  Filled 2014-02-01: qty 200

## 2014-02-01 MED ORDER — SODIUM CHLORIDE 0.9 % IV SOLN: INTRAVENOUS | Status: DC

## 2014-02-01 MED ORDER — SODIUM CHLORIDE 0.9 % IV SOLN
10.0000 mg | INTRAVENOUS | Status: DC | PRN
  Administered 2014-02-01: 15 ug/min via INTRAVENOUS

## 2014-02-01 MED ORDER — GLYCOPYRROLATE 0.2 MG/ML IJ SOLN
INTRAMUSCULAR | Status: DC | PRN
  Administered 2014-02-01: 0.2 mg via INTRAVENOUS

## 2014-02-01 MED ORDER — SODIUM CHLORIDE 0.9 % IV SOLN
INTRAVENOUS | Status: DC | PRN
  Administered 2014-02-01: 09:00:00

## 2014-02-01 MED ORDER — PHENYLEPHRINE HCL 10 MG/ML IJ SOLN
INTRAMUSCULAR | Status: DC | PRN
Start: 2014-02-01 — End: 2014-02-01
  Administered 2014-02-01 (×3): 80 ug via INTRAVENOUS

## 2014-02-01 MED ORDER — PROPOFOL 10 MG/ML IV BOLUS
INTRAVENOUS | Status: DC | PRN
  Administered 2014-02-01: 150 mg via INTRAVENOUS

## 2014-02-01 MED ORDER — PHENYLEPHRINE HCL 10 MG/ML IJ SOLN
INTRAMUSCULAR | Status: AC
  Filled 2014-02-01: qty 1

## 2014-02-01 MED ORDER — PHENYLEPHRINE 40 MCG/ML (10ML) SYRINGE FOR IV PUSH (FOR BLOOD PRESSURE SUPPORT)
PREFILLED_SYRINGE | INTRAVENOUS | Status: AC
  Filled 2014-02-01: qty 10

## 2014-02-01 MED ORDER — PROPOFOL 10 MG/ML IV BOLUS
INTRAVENOUS | Status: AC
  Filled 2014-02-01: qty 20

## 2014-02-01 MED ORDER — DEXAMETHASONE SODIUM PHOSPHATE 10 MG/ML IJ SOLN
INTRAMUSCULAR | Status: AC
  Filled 2014-02-01: qty 1

## 2014-02-01 MED ORDER — SODIUM CHLORIDE 0.9 % IJ SOLN
INTRAMUSCULAR | Status: AC
  Filled 2014-02-01: qty 10

## 2014-02-01 MED ORDER — DEXAMETHASONE SODIUM PHOSPHATE 10 MG/ML IJ SOLN
INTRAMUSCULAR | Status: DC | PRN
  Administered 2014-02-01: 10 mg via INTRAVENOUS

## 2014-02-01 NOTE — Anesthesia Postprocedure Evaluation (Signed)
Anesthesia Post Note  Patient: Brandy [name]  Procedure(s) Performed: Procedure(s) (LRB): UPPER ENDOSCOPIC ULTRASOUND (EUS) LINEAR (N/A) ENDOSCOPIC RETROGRADE CHOLANGIOPANCREATOGRAPHY (ERCP) WITH PROPOFOL (N/A)  Anesthesia type: MAC  Patient location: PACU  Post pain: Pain level controlled  Post assessment: Post-op Vital signs reviewed  Last Vitals:  Filed Vitals:   02/01/14 0956  BP: 137/51  Pulse: 64  Temp:   Resp: 22    Post vital signs: Reviewed  Level of consciousness: sedated  Complications: No apparent anesthesia complications

## 2014-02-01 NOTE — Op Note (Signed)
[facility] [place], [address]   ENDOSCOPIC ULTRASOUND PROCEDURE REPORT  PATIENT: Brandy [name], Brandy [name]  MR#: [id number] BIRTHDATE: May 02, 1933  GENDER: Female ENDOSCOPIST: [name], MD PROCEDURE DATE:  02/01/2014 PROCEDURE:   Upper EUS and also ERCP, biliary sphincterotomy ASA CLASS:      Class III INDICATIONS:   previous pancreatic mass 2012-2013 that was biopsied (EUS twice, surgically once without clear diagnosis, was presumed neoplastic however mass eventually completely resolved).  Recent biliary obstruction, CBD stones noted on MR; ERCP Dr.  Benson [place] unsuccessful cannulation, IR PTC drain placement and CBD stones were pushed into the duodenum after biliary dilation.  Still stricture noted on IR cholangiogram and so for past 6 weeks 12 Fr int/ext capped drain has been in place.  No weight loss.Marland Kitchen MEDICATIONS: General endotracheal anesthesia (GETA) and Cipro 400 mg IV DESCRIPTION OF PROCEDURE:   After the risks benefits and alternatives of the procedure were  explained, informed consent was obtained. The patient was then placed in the left, lateral, decubitus postion and IV sedation was administered. Throughout the procedure, the patients blood pressure, pulse and oxygen saturations were monitored continuously.  Under direct visualization, the Pentax Radial EUS [id number]  endoscope was introduced through the mouth  and advanced to the second portion of the duodenum .  Water was used as necessary to provide an acoustic interface.  Upon completion of the imaging, water was removed and the patient was sent to the recovery room in satisfactory condition.  EUS findings: 1. With radial endoechoscope, the pancreas, biliary tree was evaluated. There were no pancreatic or biliary masses noted.  The pancreatic parenchyma appeared fairly normal, a bit atrophic.  No stones were noted in bile duct however the biliary drain and small periampullary  diverticulum created air artifact that distorted views somewhat. 2. No peripancreatic adenopathy. 3. GB appeared normal. ERCP: 1. Dr. Pascal Lux from IR introduced a wire through the biliary drain and then the biliary drain was removed externally. 2. Alongside the wire, I cannulated the CBD using a 44 Autotome over a .035 hydrawire and injected contrast. The cholangiogram showed diffusely dilated bile duct (up to 49m), partial filling of GB and cystic duct, relatively normal intrahepatic ducts. The distal CBD tapered smoothly into the head of pancreas without obvious stricture.  The internal external biliary wire was then removed from the patient externally, the ERCP wire was left in place to ensure access.  Given the previously known stricture and bile duct stones I elected to brush the distal CBD for cytology, performed short biliary sphincterotomy and the swept the bile duct several times with a biliary balloon. No stones, pus or debris was delivered into the duodenum. The scope was removed from the patient.  The main pancreatic duct was never cannulated or injected with dye. Impression: No pancreatic or biliary masses. The previously noted CBD stricture has resolved. The distal CBD was sampled with brush for cytology, a small sphinctertomy and balloon sweeping was performed.  My suspicion for neoplasm is low.  She will have repeat LFTS at my office in one week.   Await final cytology results.  _______________________________ eSigned:[name] MD 02/01/2014 9:03 AM

## 2014-02-01 NOTE — Discharge Instructions (Signed)
YOU HAD AN ENDOSCOPIC PROCEDURE TODAY: Refer to the procedure report that was given to you for any specific questions about what was found during the examination.  If the procedure report does not answer your questions, please call your gastroenterologist to clarify. ° °YOU SHOULD EXPECT: Some feelings of bloating in the abdomen. Passage of more gas than usual.  Walking can help get rid of the air that was put into your GI tract during the procedure and reduce the bloating. If you had a lower endoscopy (such as a colonoscopy or flexible sigmoidoscopy) you may notice spotting of blood in your stool or on the toilet paper.  ° °DIET: Your first meal following the procedure should be a light meal and then it is ok to progress to your normal diet.  A half-sandwich or bowl of soup is an example of a good first meal.  Heavy or fried foods are harder to digest and may make you feel nasueas or bloated.  Drink plenty of fluids but you should avoid alcoholic beverages for 24 hours. ° °ACTIVITY: Your care partner should take you home directly after the procedure.  You should plan to take it easy, moving slowly for the rest of the day.  You can resume normal activity the day after the procedure however you should NOT DRIVE or use heavy machinery for 24 hours (because of the sedation medicines used during the test).   ° °SYMPTOMS TO REPORT IMMEDIATELY  °A gastroenterologist can be reached at any hour.  Please call your doctor's office for any of the following symptoms: ° °· Following lower endoscopy (colonoscopy, flexible sigmoidoscopy) ° Excessive amounts of blood in the stool ° Significant tenderness, worsening of abdominal pains ° Swelling of the abdomen that is new, acute ° Fever of 100° or higher °· Following upper endoscopy (EGD, EUS, ERCP) ° Vomiting of blood or coffee ground material ° New, significant abdominal pain ° New, significant chest pain or pain under the shoulder blades ° Painful or persistently difficult  swallowing ° New shortness of breath ° Black, tarry-looking stools ° °FOLLOW UP: °If any biopsies were taken you will be contacted by phone or by letter within the next 1-3 weeks.  Call your gastroenterologist if you have not heard about the biopsies in 3 weeks.  °Please also call your gastroenterologist's office with any specific questions about appointments or follow up tests. °Endoscopic Retrograde Cholangiopancreatography (ERCP), Care After °Refer to this sheet in the next few weeks. These instructions provide you with information on caring for yourself after your procedure. Your health care provider may also give you more specific instructions. Your treatment has been planned according to current medical practices, but problems sometimes occur. Call your health care provider if you have any problems or questions after your procedure.  °WHAT TO EXPECT AFTER THE PROCEDURE  °After your procedure, it is typical to feel:  °· Soreness in your throat.   °· Sick to your stomach (nauseous).   °· Bloated. °· Dizzy.   °· Fatigued. °HOME CARE INSTRUCTIONS °· Have a friend or family member stay with you for the first 24 hours after your procedure. °· Start taking your usual medicines and eating normally as soon as you feel well enough to do so or as directed by your health care provider. °SEEK MEDICAL CARE IF: °· You have abdominal pain.   °· You develop signs of infection, such as:   °¨ Chills.   °¨ Feeling unwell.   °SEEK IMMEDIATE MEDICAL CARE IF: °· You have difficulty swallowing. °· You   have worsening throat, chest, or abdominal pain. °· You vomit. °· You have bloody or very black stools. °· You have a fever. °Document Released: 04/12/2013 Document Reviewed: 04/12/2013 °Exit[employer]® Patient Information ©2015 [employer]. This information is not intended to replace advice given to you by your health care provider. Make sure you discuss any questions you have with your health care provider. ° °

## 2014-02-01 NOTE — Anesthesia Preprocedure Evaluation (Addendum)
Anesthesia Evaluation  Patient identified by MRN, date of birth, ID band Patient awake    Reviewed: Allergy & Precautions, H&P , NPO status , Patient's Chart, lab work & pertinent test results  History of Anesthesia Complications Negative for: history of anesthetic complications (Patient denies history or knowledge of difficult airwway.)  Airway Mallampati: I TM Distance: >3 FB Neck ROM: Full    Dental  (+) Edentulous Upper, Edentulous Lower   Pulmonary neg pulmonary ROS, asthma ,  breath sounds clear to auscultation  Pulmonary exam normal       Cardiovascular hypertension, Pt. on medications Rhythm:Regular Rate:Normal  '14 ECHO: EF 60-65%, mild AS    Neuro/Psych Depression negative neurological ROS     GI/Hepatic hiatal hernia, GERD-  Medicated and Controlled,Elevated LFTs   Endo/Other  negative endocrine ROS  Renal/GU negative Renal ROS     Musculoskeletal   Abdominal   Peds  Hematology  (+) Blood dyscrasia (Hb 9.7), anemia ,   Anesthesia Other Findings Breast cancer  Reproductive/Obstetrics Hx of breast CA, mastectomy L.                       Anesthesia Physical Anesthesia Plan  ASA: III  Anesthesia Plan: General   Post-op Pain Management:    Induction: Intravenous  Airway Management Planned: Oral ETT  Additional Equipment:   Intra-op Plan:   Post-operative Plan: Extubation in OR  Informed Consent: I have reviewed the patients History and Physical, chart, labs and discussed the procedure including the risks, benefits and alternatives for the proposed anesthesia with the patient or authorized representative who has indicated his/her understanding and acceptance.   Dental advisory given  Plan Discussed with: CRNA  Anesthesia Plan Comments:         Anesthesia Quick Evaluation

## 2014-02-01 NOTE — Interval H&P Note (Signed)
History and Physical Interval Note:  02/01/2014 7:14 AM  Brandy [name]  has presented today for surgery, with the diagnosis of Nonspecific (abnormal) findings on radiological and other examination of biliary tract [793.3]  The various methods of treatment have been discussed with the patient and family. After consideration of risks, benefits and other options for treatment, the patient has consented to  Procedure(s): UPPER ENDOSCOPIC ULTRASOUND (EUS) LINEAR (N/A) ENDOSCOPIC RETROGRADE CHOLANGIOPANCREATOGRAPHY (ERCP) WITH PROPOFOL (N/A) as a surgical intervention .  The patient's history has been reviewed, patient examined, no change in status, stable for surgery.  I have reviewed the patient's chart and labs.  Questions were answered to the patient's satisfaction.     [name]

## 2014-02-01 NOTE — Telephone Encounter (Signed)
Labs in EPIC

## 2014-02-01 NOTE — Transfer of Care (Signed)
Immediate Anesthesia Transfer of Care Note  Patient: Brandy [name]  Procedure(s) Performed: Procedure(s): UPPER ENDOSCOPIC ULTRASOUND (EUS) LINEAR (N/A) ENDOSCOPIC RETROGRADE CHOLANGIOPANCREATOGRAPHY (ERCP) WITH PROPOFOL (N/A)  Patient Location: PACU  Anesthesia Type:General  Level of Consciousness: sedated  Airway & Oxygen Therapy: Patient Spontanous Breathing and Patient connected to face mask oxygen  Post-op Assessment: Report given to PACU RN and Post -op Vital signs reviewed and stable  Post vital signs: Reviewed and stable  Complications: No apparent anesthesia complications

## 2014-02-01 NOTE — H&P (View-Only) (Signed)
Review of pertinent gastrointestinal problems: 1. peripancreatic mass, unclear etiology. 2012, 2013 she had a 5-6 cm mass involving the head of her pancreas. This appeared malignant however her CA 19-9 was normal. To endoscopic ultrasound with fine-needle aspirations were done neither showing neoplasm. She underwent surgical biopsy of the area and this also failed to show neoplasm. Followup scans 2013, 2014 showed complete resolution of this process. It was felt to be inflammatory, perhaps infectious after all was said and done. 2. biliary obstruction June, 2015; total bilirubin 2, elevated alkaline phosphatase, CAT scan showed dilated intra-and extrahepatic bile duct. There was a high suspicion of gallstones in her distal bile duct. ERCP attempt by Dr. Daphene Calamity was unsuccessful. Eventual interventional radiology placement of biliary drain was performed. They documented removal of filling defects in the bile duct there were consistent with gallstones. Repeat drain study, showed continued "high-grade stricture" and distal bile duct. Internal/external drain remained, this has been capped.  HPI: [name] whom I last saw several years ago.  She was recently in the hospital with biliary obstruction that was felt to be gallstone related. She has no gallstones in her existing gallbladder however.  She currently has a right-sided biliary drain placed it causes mild discomfort but is functioning very well with normal liver tests late last week.  She has lost 5-10 pounds over past few months.  No abd pains.  Had previously been having vomiting.     Review of systems: Pertinent positive and negative review of systems were noted in the above HPI section. Complete review of systems was performed and was otherwise normal.    Past Medical History  Diagnosis Date  . Asthma   . Hyperlipidemia     takes Simvastatin daily  . Hypertension     takes Amlodipine and  Diovan daily  . Cancer     left breast  . Bronchitis     hx of;last time >74yr ago  . Arthritis     shoulders  . Dysphagia   . H/O hiatal hernia   . GERD (gastroesophageal reflux disease)     takes Prilosec prn  . Urinary urgency   . History of kidney stones     many yrs ago  . Depression     takes Celexa daily  . Insomnia     takes Ambien nightly  . Breast cancer 12/18/11    left breast masectomy=metastatic ca in (1/1) lymph node ,invasive ductal ca,2 foci,,dcis,lymph ovascular invasion identified,surgical resection margins neg for ca,additional tissue=benign skin and subcutaneous tissue  . Breast CA 11/23/11    left breast 3 o'clock bx=high grade ductal ca in situ w/comedononecrosis and calcification,dcis,lymphovascular invasion present ,ER?PR+positive  . Full dentures   . Hx of radiation therapy 04/26/12 -06/10/12    left breast    Past Surgical History  Procedure Laterality Date  . Breast biopsy  1998    left  . Total shoulder replacement  2011    left  . Eus  06/04/2011    Procedure: UPPER ENDOSCOPIC ULTRASOUND (EUS) LINEAR;  Surgeon: Owens Loffler, MD;  Location: [facility];  Service: Endoscopy;  Laterality: N/A;  . Exploratory laparotomy       biopsy of intra-abdominal mass  . Bladder tack    . Tubal ligation    . Appendectomy    . Dilation and curettage of uterus    . Esophagogastroduodenoscopy    . Mastectomy w/ sentinel node biopsy  12/18/2011  Procedure: MASTECTOMY WITH SENTINEL LYMPH NODE BIOPSY;  Surgeon: Rolm Bookbinder, MD;  Location: [facility];  Service: General;  Laterality: Left;  . Portacath placement  01/27/2012    Procedure: INSERTION PORT-A-CATH;  Surgeon: Rolm Bookbinder, MD;  Location: [facility];  Service: General;  Laterality: Right;  PORT PLACEMENT  . Port-a-cath removal Right 06/15/2013    Procedure: REMOVAL PORT-A-CATH;  Surgeon: Rolm Bookbinder, MD;  Location: [facility];  Service: General;  Laterality: Right;   . Esophagogastroduodenoscopy (egd) with propofol  12/19/2013    Procedure: ESOPHAGOGASTRODUODENOSCOPY (EGD) WITH PROPOFOL;  Surgeon: Beryle Beams, MD;  Location: [facility];  Service: Endoscopy;;    Current Outpatient Prescriptions  Medication Sig Dispense Refill  . amLODipine (NORVASC) 10 MG tablet Take 10 mg by mouth every morning.       . ciprofloxacin (CIPRO) 500 MG tablet Take 1 tablet (500 mg total) by mouth 2 (two) times daily.  10 tablet  0  . citalopram (CELEXA) 20 MG tablet Take 20 mg by mouth every morning.       . gabapentin (NEURONTIN) 100 MG capsule Take 300 mg by mouth at bedtime.      Marland Kitchen letrozole (FEMARA) 2.5 MG tablet Take 2.5 mg by mouth daily.      . metroNIDAZOLE (FLAGYL) 500 MG tablet Take 1 tablet (500 mg total) by mouth 3 (three) times daily.  15 tablet  0  . omeprazole (PRILOSEC) 20 MG capsule Take 20 mg by mouth daily.      . simvastatin (ZOCOR) 20 MG tablet Take 20 mg by mouth daily.       Marland Kitchen spironolactone (ALDACTONE) 25 MG tablet Take 0.5 tablets (12.5 mg total) by mouth daily.  30 tablet  6  . traMADol (ULTRAM) 50 MG tablet Take 1 tablet (50 mg total) by mouth every 6 (six) hours as needed.  12 tablet  0  . valsartan-hydrochlorothiazide (DIOVAN-HCT) 160-12.5 MG per tablet Take 1 tablet by mouth every morning.       . zolpidem (AMBIEN) 10 MG tablet Take 10 mg by mouth at bedtime.       No current facility-administered medications for this visit.   Facility-Administered Medications Ordered in Other Visits  Medication Dose Route Frequency Provider Last Rate Last Dose  . sodium chloride 0.9 % injection 10 mL  10 mL Intracatheter PRN Deatra Robinson, MD   10 mL at 03/14/12 1628    Allergies as of 01/02/2014  . (No Known Allergies)    Family History  Problem Relation Age of Onset  . Prostate cancer Father   . Breast cancer Other     History   Social History  . Marital Status: Married    Spouse Name: N/A    Number of Children: N/A  . Years of  Education: N/A   Occupational History  . Not on file.   Social History Main Topics  . Smoking status: Never Smoker   . Smokeless tobacco: Never Used  . Alcohol Use: No  . Drug Use: No  . Sexual Activity: Yes    Birth Control/ Protection: Surgical     Comment: mensus age 29, 30st pregnancy 35, no hrt gg4,p3, 1 babay lived a few hours complications   Other Topics Concern  . Not on file   Social History Narrative  . No narrative on file       Physical Exam: BP 120/58  Pulse 72  Ht 5' (1.524 m)  Wt 154 lb [phone]  kg)  BMI 30.08 kg/m2 Constitutional: generally well-appearing Psychiatric: alert and oriented x3 Eyes: extraocular movements intact Mouth: oral pharynx moist, no lesions Neck: supple no lymphadenopathy Cardiovascular: heart regular rate and rhythm Lungs: clear to auscultation bilaterally Abdomen: soft, nontender, nondistended, no obvious ascites, no peritoneal signs, normal bowel sounds Extremities: no lower extremity edema bilaterally Skin: no lesions on visible extremities    Assessment and plan: 80 y.o. female with  complex pancreatic or biliary history, recent biliary obstruction  Biliary cholangiograms show a very tight stricture in her distal bile duct. This is in the region of her pancreas. She had previous neoplasm like imaging of her pancreatic head however obviously she did not pancreatic adenocarcinoma. See the results of 2012 testing summarized above. Perhaps there was an inflammatory process and it has left her with chronic pancreatitis with resultant scarring of her pancreas, biliary structure and. She has lost 10-12 pounds in the past several months. She currently has a internal/external biliary drain is capped.  I am concerned that if she simply has her drain pulled she will have biliary obstruction shortly thereafter. The reason I am concerned about that is the very tight nature of the stricture seen on PTC. We will try to coordinate with  interventional radiology placing a wire through the biliary drain that I may access in a retrograde fashion with endoscopic ultrasound and probable also ERCP same day. Radiology had said the 31 French drain should usually stay in place for another few weeks, we will aim for July 30 which is a Thursday.

## 2014-02-01 NOTE — Telephone Encounter (Signed)
Message copied by Barron Alvine on Thu Feb 01, 2014  9:10 AM ------      Message from: Owens Loffler P      Created: Thu Feb 01, 2014  9:04 AM       She needs LFTs in one week.  Thanks       ------

## 2014-02-05 ENCOUNTER — Encounter (HOSPITAL_COMMUNITY): Payer: Self-pay | Admitting: Gastroenterology

## 2014-02-08 ENCOUNTER — Other Ambulatory Visit (INDEPENDENT_AMBULATORY_CARE_PROVIDER_SITE_OTHER): Payer: Medicare HMO

## 2014-02-08 ENCOUNTER — Telehealth: Payer: Self-pay

## 2014-02-08 DIAGNOSIS — R933 Abnormal findings on diagnostic imaging of other parts of digestive tract: Secondary | ICD-10-CM

## 2014-02-08 LAB — HEPATIC FUNCTION PANEL
ALK PHOS: 63 U/L (ref 39–117)
ALT: 17 U/L (ref 0–35)
AST: 20 U/L (ref 0–37)
Albumin: 3.2 g/dL — ABNORMAL LOW (ref 3.5–5.2)
BILIRUBIN DIRECT: 0.1 mg/dL (ref 0.0–0.3)
BILIRUBIN TOTAL: 0.4 mg/dL (ref 0.2–1.2)
Total Protein: 6.4 g/dL (ref 6.0–8.3)

## 2014-02-08 NOTE — Telephone Encounter (Signed)
Message copied by Barron Alvine on Thu Feb 08, 2014  8:14 AM ------      Message from: Barron Alvine      Created: Thu Feb 01, 2014  9:13 AM       Pt to get labs        ------

## 2014-02-08 NOTE — Telephone Encounter (Signed)
Pt aware and will have labs this after noon

## 2014-02-17 ENCOUNTER — Encounter: Payer: Self-pay | Admitting: *Deleted

## 2014-02-19 ENCOUNTER — Other Ambulatory Visit: Payer: Self-pay

## 2014-02-19 DIAGNOSIS — R945 Abnormal results of liver function studies: Principal | ICD-10-CM

## 2014-02-19 DIAGNOSIS — R7989 Other specified abnormal findings of blood chemistry: Secondary | ICD-10-CM

## 2014-03-02 ENCOUNTER — Other Ambulatory Visit (INDEPENDENT_AMBULATORY_CARE_PROVIDER_SITE_OTHER): Payer: Medicare HMO

## 2014-03-02 DIAGNOSIS — R945 Abnormal results of liver function studies: Principal | ICD-10-CM

## 2014-03-02 DIAGNOSIS — R7989 Other specified abnormal findings of blood chemistry: Secondary | ICD-10-CM

## 2014-03-02 LAB — HEPATIC FUNCTION PANEL
ALBUMIN: 3.6 g/dL (ref 3.5–5.2)
ALT: 17 U/L (ref 0–35)
AST: 23 U/L (ref 0–37)
Alkaline Phosphatase: 74 U/L (ref 39–117)
Bilirubin, Direct: 0.1 mg/dL (ref 0.0–0.3)
TOTAL PROTEIN: 7.5 g/dL (ref 6.0–8.3)
Total Bilirubin: 0.6 mg/dL (ref 0.2–1.2)

## 2014-03-05 ENCOUNTER — Other Ambulatory Visit: Payer: Self-pay

## 2014-03-05 DIAGNOSIS — R7989 Other specified abnormal findings of blood chemistry: Secondary | ICD-10-CM

## 2014-03-05 DIAGNOSIS — R945 Abnormal results of liver function studies: Principal | ICD-10-CM

## 2014-03-08 ENCOUNTER — Encounter (HOSPITAL_COMMUNITY): Payer: Self-pay | Admitting: Vascular Surgery

## 2014-03-28 ENCOUNTER — Other Ambulatory Visit: Payer: Self-pay

## 2014-03-28 DIAGNOSIS — C50419 Malignant neoplasm of upper-outer quadrant of unspecified female breast: Secondary | ICD-10-CM

## 2014-03-28 MED ORDER — GABAPENTIN 100 MG PO CAPS: 300.0000 mg | ORAL_CAPSULE | Freq: Every day | ORAL | Status: DC

## 2014-04-26 ENCOUNTER — Other Ambulatory Visit (HOSPITAL_COMMUNITY): Payer: Self-pay | Admitting: Adult Health

## 2014-06-07 ENCOUNTER — Other Ambulatory Visit: Payer: Self-pay | Admitting: Hematology and Oncology

## 2014-06-07 ENCOUNTER — Other Ambulatory Visit (HOSPITAL_BASED_OUTPATIENT_CLINIC_OR_DEPARTMENT_OTHER): Payer: Medicare HMO

## 2014-06-07 ENCOUNTER — Ambulatory Visit (HOSPITAL_BASED_OUTPATIENT_CLINIC_OR_DEPARTMENT_OTHER): Payer: Medicare HMO | Admitting: Hematology and Oncology

## 2014-06-07 ENCOUNTER — Ambulatory Visit (HOSPITAL_BASED_OUTPATIENT_CLINIC_OR_DEPARTMENT_OTHER): Payer: Medicare HMO

## 2014-06-07 VITALS — BP 163/58 | HR 64 | Temp 97.9°F | Resp 17 | Ht 61.0 in | Wt 160.3 lb

## 2014-06-07 DIAGNOSIS — C50412 Malignant neoplasm of upper-outer quadrant of left female breast: Secondary | ICD-10-CM

## 2014-06-07 DIAGNOSIS — M8580 Other specified disorders of bone density and structure, unspecified site: Secondary | ICD-10-CM

## 2014-06-07 DIAGNOSIS — Z79811 Long term (current) use of aromatase inhibitors: Secondary | ICD-10-CM | POA: Diagnosis not present

## 2014-06-07 DIAGNOSIS — M8589 Other specified disorders of bone density and structure, multiple sites: Secondary | ICD-10-CM

## 2014-06-07 DIAGNOSIS — Z170 Estrogen receptor positive status [ER+]: Secondary | ICD-10-CM | POA: Diagnosis not present

## 2014-06-07 DIAGNOSIS — C50419 Malignant neoplasm of upper-outer quadrant of unspecified female breast: Secondary | ICD-10-CM

## 2014-06-07 LAB — COMPREHENSIVE METABOLIC PANEL (CC13)
ALBUMIN: 3.5 g/dL (ref 3.5–5.0)
ALK PHOS: 73 U/L (ref 40–150)
ALT: 27 U/L (ref 0–55)
AST: 23 U/L (ref 5–34)
Anion Gap: 9 mEq/L (ref 3–11)
BUN: 18.6 mg/dL (ref 7.0–26.0)
CO2: 27 mEq/L (ref 22–29)
Calcium: 9.9 mg/dL (ref 8.4–10.4)
Chloride: 104 mEq/L (ref 98–109)
Creatinine: 0.8 mg/dL (ref 0.6–1.1)
EGFR: 70 mL/min/{1.73_m2} — ABNORMAL LOW (ref >90)
GLUCOSE: 109 mg/dL (ref 70–140)
POTASSIUM: 4.4 meq/L (ref 3.5–5.1)
SODIUM: 140 meq/L (ref 136–145)
TOTAL PROTEIN: 7.2 g/dL (ref 6.4–8.3)
Total Bilirubin: 0.24 mg/dL (ref 0.20–1.20)

## 2014-06-07 LAB — CBC WITH DIFFERENTIAL/PLATELET
BASO%: 0.6 % (ref 0.0–2.0)
Basophils Absolute: 0.1 10*3/uL (ref 0.0–0.1)
EOS ABS: 0.2 10*3/uL (ref 0.0–0.5)
EOS%: 1.6 % (ref 0.0–7.0)
HCT: 35.3 % (ref 34.8–46.6)
HGB: 11.3 g/dL — ABNORMAL LOW (ref 11.6–15.9)
LYMPH#: 2.6 10*3/uL (ref 0.9–3.3)
LYMPH%: 28.4 % (ref 14.0–49.7)
MCH: 27.9 pg (ref 25.1–34.0)
MCHC: 31.9 g/dL (ref 31.5–36.0)
MCV: 87.4 fL (ref 79.5–101.0)
MONO#: 0.6 10*3/uL (ref 0.1–0.9)
MONO%: 6.8 % (ref 0.0–14.0)
NEUT%: 62.6 % (ref 38.4–76.8)
NEUTROS ABS: 5.8 10*3/uL (ref 1.5–6.5)
Platelets: 281 10*3/uL (ref 145–400)
RBC: 4.04 10*6/uL (ref 3.70–5.45)
RDW: 13.8 % (ref 11.2–14.5)
WBC: 9.2 10*3/uL (ref 3.9–10.3)

## 2014-06-07 MED ORDER — EPINEPHRINE HCL 0.1 MG/ML IJ SOLN: 0.2500 mg | Freq: Once | INTRAMUSCULAR | Status: DC | PRN

## 2014-06-07 MED ORDER — ALBUTEROL SULFATE (2.5 MG/3ML) 0.083% IN NEBU
2.5000 mg | INHALATION_SOLUTION | Freq: Once | RESPIRATORY_TRACT | Status: DC | PRN
  Filled 2014-06-07: qty 3

## 2014-06-07 MED ORDER — METHYLPREDNISOLONE SODIUM SUCC 125 MG IJ SOLR: 125.0000 mg | Freq: Once | INTRAMUSCULAR | Status: DC | PRN

## 2014-06-07 MED ORDER — DENOSUMAB 60 MG/ML ~~LOC~~ SOLN
60.0000 mg | Freq: Once | SUBCUTANEOUS | Status: AC
  Administered 2014-06-07: 60 mg via SUBCUTANEOUS
  Filled 2014-06-07: qty 1

## 2014-06-07 MED ORDER — SODIUM CHLORIDE 0.9 % IV SOLN: Freq: Once | INTRAVENOUS | Status: DC

## 2014-06-07 MED ORDER — DIPHENHYDRAMINE HCL 50 MG/ML IJ SOLN: 50.0000 mg | Freq: Once | INTRAMUSCULAR | Status: DC | PRN

## 2014-06-07 MED ORDER — DIPHENHYDRAMINE HCL 50 MG/ML IJ SOLN: 25.0000 mg | Freq: Once | INTRAMUSCULAR | Status: DC | PRN

## 2014-06-07 MED ORDER — SODIUM CHLORIDE 0.9 % IV SOLN: Freq: Once | INTRAVENOUS | Status: DC | PRN

## 2014-06-07 NOTE — Assessment & Plan Note (Signed)
Left breast cancer multifocal disease 1.7 cm and 0.7 cm, one sentinel lymph node positive with focal extracapsular extension: 1.7 cm mass was ER/PR positive and HER-2 negative with a Ki-67 of 30%: The 0.7 cm mass was ER 100%, PR 7%, Ki-67 15%, HER-2 positive ratio 4.94, status post chemotherapy with Taxol Herceptin from 02/09/2012 to 04/04/2012 discontinued early due to Taxol side effects followed by Herceptin maintenance followed by radiation therapy and adjuvant letrozole was started December 2013  Letrozole side effects: The patient does not have any major side effects to letrozole. She was found to have osteopenia April 2014 and was started on Prolia. Most recent bone density test revealed that she has 7.8% increase in bone density compared to the prior her T score is -2.4. I encouraged her to continue Prolia along with calcium and vitamin D with repeat bone density in 2 years.  Surveillance:mammograms done in July 2015 were normal. Today's breast exam was also normal. Left-sided chest wall is still tender no palpable lymphadenopathy.  Return to clinic in 6 months for followup and review of blood work as well as physical examination.

## 2014-06-07 NOTE — Patient Instructions (Signed)
Denosumab injection What is this medicine? DENOSUMAB (den oh sue mab) slows bone breakdown. Prolia is used to treat osteoporosis in women after menopause and in men. Xgeva is used to prevent bone fractures and other bone problems caused by cancer bone metastases. Xgeva is also used to treat giant cell tumor of the bone. This medicine may be used for other purposes; ask your health care provider or pharmacist if you have questions. COMMON BRAND NAME(S): Prolia, XGEVA What should I tell my health care provider before I take this medicine? They need to know if you have any of these conditions: -dental disease -eczema -infection or history of infections -kidney disease or on dialysis -low blood calcium or vitamin D -malabsorption syndrome -scheduled to have surgery or tooth extraction -taking medicine that contains denosumab -thyroid or parathyroid disease -an unusual reaction to denosumab, other medicines, foods, dyes, or preservatives -pregnant or trying to get pregnant -breast-feeding How should I use this medicine? This medicine is for injection under the skin. It is given by a health care professional in a hospital or clinic setting. If you are getting Prolia, a special MedGuide will be given to you by the pharmacist with each prescription and refill. Be sure to read this information carefully each time. For Prolia, talk to your pediatrician regarding the use of this medicine in children. Special care may be needed. For Xgeva, talk to your pediatrician regarding the use of this medicine in children. While this drug may be prescribed for children as young as 13 years for selected conditions, precautions do apply. Overdosage: If you think you've taken too much of this medicine contact a poison control center or emergency room at once. Overdosage: If you think you have taken too much of this medicine contact a poison control center or emergency room at once. NOTE: This medicine is only for  you. Do not share this medicine with others. What if I miss a dose? It is important not to miss your dose. Call your doctor or health care professional if you are unable to keep an appointment. What may interact with this medicine? Do not take this medicine with any of the following medications: -other medicines containing denosumab This medicine may also interact with the following medications: -medicines that suppress the immune system -medicines that treat cancer -steroid medicines like prednisone or cortisone This list may not describe all possible interactions. Give your health care provider a list of all the medicines, herbs, non-prescription drugs, or dietary supplements you use. Also tell them if you smoke, drink alcohol, or use illegal drugs. Some items may interact with your medicine. What should I watch for while using this medicine? Visit your doctor or health care professional for regular checks on your progress. Your doctor or health care professional may order blood tests and other tests to see how you are doing. Call your doctor or health care professional if you get a cold or other infection while receiving this medicine. Do not treat yourself. This medicine may decrease your body's ability to fight infection. You should make sure you get enough calcium and vitamin D while you are taking this medicine, unless your doctor tells you not to. Discuss the foods you eat and the vitamins you take with your health care professional. See your dentist regularly. Brush and floss your teeth as directed. Before you have any dental work done, tell your dentist you are receiving this medicine. Do not become pregnant while taking this medicine or for 5 months after stopping   it. Women should inform their doctor if they wish to become pregnant or think they might be pregnant. There is a potential for serious side effects to an unborn child. Talk to your health care professional or pharmacist for more  information. What side effects may I notice from receiving this medicine? Side effects that you should report to your doctor or health care professional as soon as possible: -allergic reactions like skin rash, itching or hives, swelling of the face, lips, or tongue -breathing problems -chest pain -fast, irregular heartbeat -feeling faint or lightheaded, falls -fever, chills, or any other sign of infection -muscle spasms, tightening, or twitches -numbness or tingling -skin blisters or bumps, or is dry, peels, or red -slow healing or unexplained pain in the mouth or jaw -unusual bleeding or bruising Side effects that usually do not require medical attention (Report these to your doctor or health care professional if they continue or are bothersome.): -muscle pain -stomach upset, gas This list may not describe all possible side effects. Call your doctor for medical advice about side effects. You may report side effects to FDA at 1-800-FDA-1088. Where should I keep my medicine? This medicine is only given in a clinic, doctor's office, or other health care setting and will not be stored at home. NOTE: This sheet is a summary. It may not cover all possible information. If you have questions about this medicine, talk to your doctor, pharmacist, or health care provider.  2015, Elsevier/Gold Standard. (2011-12-21 12:37:47)  

## 2014-06-07 NOTE — Progress Notes (Signed)
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 Showed multifocal disease one of the nodules was HER-2 positive and has required Taxol and Herceptin treatment which was discontinued because of intolerance to Taxol and Herceptin was continued as a maintenance therapy for one year she finished radiation therapy and went on antiestrogen therapy with letrozole since December 2013. She has been tolerating letrozole extremely well without any major side effects. She was found to have osteopenia and was started on Prolia every 6 months. She has been tolerating it fairly well. She is here today for regular followup denies any new lumps or nodules in the breast.  REVIEW OF SYSTEMS:   Constitutional: Denies fevers, chills or abnormal weight loss Eyes: Denies blurriness of vision Ears, nose, mouth, throat, and face: Denies mucositis or sore throat Respiratory: Denies cough, dyspnea or wheezes Cardiovascular: Denies palpitation, chest discomfort or lower extremity swelling Gastrointestinal:  Denies nausea, heartburn or change in bowel habits Skin: Denies abnormal skin rashes Lymphatics: Denies new lymphadenopathy or easy bruising Neurological:Denies numbness, tingling or new weaknesses Behavioral/Psych: Mood is stable, no new changes  Breast:  denies any pain or lumps or nodules in either breasts All other systems were reviewed with the patient and are negative.  I have reviewed the past medical history, past surgical history, social history and family history with the patient and they are unchanged from previous note.  ALLERGIES:  has No Known Allergies.  MEDICATIONS:   Current Outpatient Prescriptions  Medication Sig Dispense Refill  . amLODipine (NORVASC) 10 MG tablet Take 10 mg by mouth every morning.     Marland Kitchen atorvastatin (LIPITOR) 40 MG tablet Take 40 mg by mouth daily.  1  . citalopram (CELEXA) 20 MG tablet Take 20 mg by mouth every morning.     . gabapentin (NEURONTIN) 100 MG capsule Take 3 capsules (300 mg total) by mouth at bedtime. 90 capsule 3  . letrozole (FEMARA) 2.5 MG tablet Take 2.5 mg by mouth daily.    Marland Kitchen omeprazole (PRILOSEC) 20 MG capsule Take 20 mg by mouth daily.    . simvastatin (ZOCOR) 20 MG tablet Take 20 mg by mouth daily.     Marland Kitchen spironolactone (ALDACTONE) 25 MG tablet Take 25 mg by mouth every morning.    Marland Kitchen spironolactone (ALDACTONE) 25 MG tablet TAKE 0.5 TABLETS (12.5 MG TOTAL) BY MOUTH DAILY. 30 tablet 4  . valsartan-hydrochlorothiazide (DIOVAN-HCT) 160-12.5 MG per tablet Take 1 tablet by mouth every morning.     . zolpidem (AMBIEN) 10 MG tablet Take 10 mg by mouth at bedtime.     No current facility-administered medications for this visit.   Facility-Administered Medications Ordered in Other Visits  Medication Dose Route Frequency Provider Last Rate Last Dose  . sodium chloride 0.9 % injection 10 mL  10 mL Intracatheter PRN Deatra Robinson, MD   10 mL at 03/14/12 1628    PHYSICAL EXAMINATION: ECOG PERFORMANCE STATUS: 0 - Asymptomatic  Filed Vitals:   06/07/14 1451  BP: 163/58  Pulse: 64  Temp: 97.9 F (36.6 C)  Resp: 17   Filed Weights   06/07/14 1451  Weight: 160 lb 4.8 oz (72.712 kg)    GENERAL:alert, no distress and comfortable SKIN:  skin color, texture, turgor are normal, no rashes or significant lesions EYES: normal, Conjunctiva are pink and non-injected, sclera clear OROPHARYNX:no exudate, no erythema and lips, buccal mucosa, and tongue normal  NECK: supple, thyroid normal size, non-tender, without nodularity LYMPH:  no palpable lymphadenopathy in the cervical, axillary or inguinal LUNGS: clear to  auscultation and percussion with normal breathing effort HEART: regular rate & rhythm and no murmurs and no lower extremity edema ABDOMEN:abdomen soft, non-tender and normal bowel sounds Musculoskeletal:no cyanosis of digits and no clubbing  NEURO: alert & oriented x 3 with fluent speech, no focal motor/sensory deficits BREAST: No palpable masses or nodules in either right breast. Left chest wall and axilla no palpable nodularity. No palpable axillary supraclavicular or infraclavicular adenopathy no breast tenderness or nipple discharge.   LABORATORY DATA:  I have reviewed the data as listed   Chemistry      Component Value Date/Time   NA 140 06/07/2014 1407   NA 138 12/29/2013 0715   K 4.4 06/07/2014 1407   K 3.3* 12/29/2013 0715   CL 99 12/29/2013 0715   CL 108* 12/28/2012 1343   CO2 27 06/07/2014 1407   CO2 25 12/29/2013 0715   BUN 18.6 06/07/2014 1407   BUN 11 12/29/2013 0715   CREATININE 0.8 06/07/2014 1407   CREATININE 0.59 12/29/2013 0715      Component Value Date/Time   CALCIUM 9.9 06/07/2014 1407   CALCIUM 8.8 12/29/2013 0715   ALKPHOS 73 06/07/2014 1407   ALKPHOS 74 03/02/2014 1429   AST 23 06/07/2014 1407   AST 23 03/02/2014 1429   ALT 27 06/07/2014 1407   ALT 17 03/02/2014 1429   BILITOT 0.24 06/07/2014 1407   BILITOT 0.6 03/02/2014 1429       Lab Results  Component Value Date   WBC 9.2 06/07/2014   HGB 11.3* 06/07/2014   HCT 35.3 06/07/2014   MCV 87.4 06/07/2014   PLT 281 06/07/2014   NEUTROABS 5.8 06/07/2014     RADIOGRAPHIC STUDIES: I have personally reviewed the radiology reports and agreed with their findings. Bone density showed a T score of -2.4 borderline osteoporosis Mammograms done in June 2015 normal breast density category a  ASSESSMENT & PLAN:  Primary cancer of upper outer quadrant of left female breast Left breast cancer multifocal disease 1.7 cm and 0.7 cm, one sentinel lymph node positive with focal extracapsular extension: 1.7  cm mass was ER/PR positive and HER-2 negative with a Ki-67 of 30%: The 0.7 cm mass was ER 100%, PR 7%, Ki-67 15%, HER-2 positive ratio 4.94, status post chemotherapy with Taxol Herceptin from 02/09/2012 to 04/04/2012 discontinued early due to Taxol side effects followed by Herceptin maintenance followed by radiation therapy and adjuvant letrozole was started December 2013  Letrozole side effects: The patient does not have any major side effects to letrozole. She was found to have osteopenia April 2014 and was started on Prolia. Most recent bone density test revealed that she has 7.8% increase in bone density compared to the prior her T score is -2.4. I encouraged her to continue Prolia along with calcium and vitamin D with repeat bone density in 2 years.  Surveillance:mammograms done in July 2015 were normal. Today's breast exam was also normal. Left-sided chest wall is still tender no palpable lymphadenopathy.  Return to clinic in 6 months for followup and review of blood work as well as physical examination.   No orders of the defined types were placed in this encounter.  The patient has a good understanding of the overall plan. she agrees with it. She will call with any problems that may develop before her next visit here.   Rulon Eisenmenger, MD 06/07/2014 3:25 PM

## 2014-06-08 ENCOUNTER — Telehealth: Payer: Self-pay | Admitting: Hematology and Oncology

## 2014-06-08 NOTE — Telephone Encounter (Signed)
, °

## 2014-07-20 ENCOUNTER — Other Ambulatory Visit: Payer: Self-pay | Admitting: Hematology and Oncology

## 2014-07-31 ENCOUNTER — Telehealth: Payer: Self-pay | Admitting: Hematology and Oncology

## 2014-07-31 NOTE — Telephone Encounter (Signed)
due to call day 6/6 appt moved to AM. s/w pt she is aware.

## 2014-09-18 ENCOUNTER — Other Ambulatory Visit: Payer: Self-pay | Admitting: *Deleted

## 2014-09-18 ENCOUNTER — Telehealth: Payer: Self-pay | Admitting: *Deleted

## 2014-09-18 MED ORDER — LETROZOLE 2.5 MG PO TABS: 2.5000 mg | ORAL_TABLET | Freq: Every day | ORAL | Status: DC

## 2014-09-18 NOTE — Telephone Encounter (Signed)
Patient called to say her new letrozole prescription will cost over $200.00.  Confirmed with [facility] that the generic 90 day supply co-pay with her Medicare Part D is over $200.00.  Attempted to call patient, left voicemail asking her to call back to discuss the situation.

## 2014-09-18 NOTE — Telephone Encounter (Signed)
Opened in error

## 2014-09-18 NOTE — Telephone Encounter (Signed)
Request from [facility] for refill.

## 2014-09-19 NOTE — Telephone Encounter (Signed)
Patient called back and we discussed her drug coverage with [employer].  She will call [employer] to she what her deductible is.  Suggested she could pick up 30 day supplies if it is easier for her to pay for the drug monthly.  She agreed to do so.

## 2014-12-07 ENCOUNTER — Other Ambulatory Visit: Payer: Self-pay | Admitting: Hematology and Oncology

## 2014-12-07 ENCOUNTER — Other Ambulatory Visit: Payer: Self-pay | Admitting: *Deleted

## 2014-12-07 ENCOUNTER — Telehealth: Payer: Self-pay | Admitting: Hematology and Oncology

## 2014-12-07 DIAGNOSIS — C50412 Malignant neoplasm of upper-outer quadrant of left female breast: Secondary | ICD-10-CM

## 2014-12-07 NOTE — Telephone Encounter (Signed)
Lft msg for pt confirming labs added for 06/06 per 06/03 POF, called home # but no answer and no vm.... KJ

## 2014-12-10 ENCOUNTER — Ambulatory Visit: Payer: Medicare HMO

## 2014-12-10 ENCOUNTER — Ambulatory Visit: Payer: Medicare HMO | Admitting: Hematology and Oncology

## 2014-12-10 ENCOUNTER — Other Ambulatory Visit: Payer: Medicare HMO

## 2014-12-10 NOTE — Assessment & Plan Note (Signed)
Left breast cancer multifocal disease 1.7 cm and 0.7 cm, one sentinel lymph node positive with focal extracapsular extension: 1.7 cm mass was ER/PR positive and HER-2 negative with a Ki-67 of 30%: The 0.7 cm mass was ER 100%, PR 7%, Ki-67 15%, HER-2 positive ratio 4.94, status post chemotherapy with Taxol Herceptin from 02/09/2012 to 04/04/2012 discontinued early due to Taxol side effects followed by Herceptin maintenance followed by radiation therapy and adjuvant letrozole was started December 2013  Letrozole side effects: The patient does not have any major side effects to letrozole. She was found to have osteopenia April 2014 and was started on Prolia. Most recent bone density test revealed that she has 7.8% increase in bone density compared to the prior her T score is -2.4. I encouraged her to continue Prolia along with calcium and vitamin D with repeat bone density in 2 years. I discussed with her the cost of antiestrogen therapy and I recommended switching her from letrozole to anastrozole.  Breast Cancer Surveillance: 1. Breast exam 12/10/2014: Normal 2. Mammogram 01/10/2014 No abnormalities. Postsurgical changes. Breast Density Category A. I recommended that she get 3-D mammograms for surveillance. Discussed the differences between different breast density categories.

## 2014-12-15 ENCOUNTER — Other Ambulatory Visit: Payer: Self-pay | Admitting: Hematology and Oncology

## 2014-12-17 ENCOUNTER — Other Ambulatory Visit: Payer: Self-pay

## 2014-12-17 ENCOUNTER — Telehealth: Payer: Self-pay | Admitting: Hematology and Oncology

## 2014-12-17 NOTE — Telephone Encounter (Signed)
Called patient and she is aware of her new appointment

## 2014-12-17 NOTE — Telephone Encounter (Signed)
Last OV 6/6.  pof entered.  Note to pharm.  Chart reviewed

## 2014-12-20 ENCOUNTER — Other Ambulatory Visit: Payer: Self-pay

## 2015-01-08 ENCOUNTER — Ambulatory Visit (HOSPITAL_BASED_OUTPATIENT_CLINIC_OR_DEPARTMENT_OTHER): Payer: Medicare HMO | Admitting: Hematology and Oncology

## 2015-01-08 ENCOUNTER — Encounter: Payer: Self-pay | Admitting: Hematology and Oncology

## 2015-01-08 ENCOUNTER — Telehealth: Payer: Self-pay | Admitting: Hematology and Oncology

## 2015-01-08 ENCOUNTER — Other Ambulatory Visit: Payer: Self-pay | Admitting: Hematology and Oncology

## 2015-01-08 ENCOUNTER — Ambulatory Visit (HOSPITAL_BASED_OUTPATIENT_CLINIC_OR_DEPARTMENT_OTHER): Payer: Medicare HMO

## 2015-01-08 ENCOUNTER — Other Ambulatory Visit (HOSPITAL_BASED_OUTPATIENT_CLINIC_OR_DEPARTMENT_OTHER): Payer: Medicare HMO

## 2015-01-08 VITALS — BP 148/60 | HR 68 | Temp 98.3°F | Resp 18 | Ht 61.0 in | Wt 170.6 lb

## 2015-01-08 DIAGNOSIS — C50412 Malignant neoplasm of upper-outer quadrant of left female breast: Secondary | ICD-10-CM

## 2015-01-08 DIAGNOSIS — M8580 Other specified disorders of bone density and structure, unspecified site: Secondary | ICD-10-CM | POA: Diagnosis not present

## 2015-01-08 LAB — CBC WITH DIFFERENTIAL/PLATELET
BASO%: 0.4 % (ref 0.0–2.0)
BASOS ABS: 0 10*3/uL (ref 0.0–0.1)
EOS%: 2.8 % (ref 0.0–7.0)
Eosinophils Absolute: 0.3 10*3/uL (ref 0.0–0.5)
HEMATOCRIT: 32.9 % — AB (ref 34.8–46.6)
HGB: 11.1 g/dL — ABNORMAL LOW (ref 11.6–15.9)
LYMPH#: 2.8 10*3/uL (ref 0.9–3.3)
LYMPH%: 29.1 % (ref 14.0–49.7)
MCH: 29.3 pg (ref 25.1–34.0)
MCHC: 33.6 g/dL (ref 31.5–36.0)
MCV: 87.3 fL (ref 79.5–101.0)
MONO#: 0.8 10*3/uL (ref 0.1–0.9)
MONO%: 8.1 % (ref 0.0–14.0)
NEUT#: 5.8 10*3/uL (ref 1.5–6.5)
NEUT%: 59.6 % (ref 38.4–76.8)
Platelets: 243 10*3/uL (ref 145–400)
RBC: 3.77 10*6/uL (ref 3.70–5.45)
RDW: 13.7 % (ref 11.2–14.5)
WBC: 9.8 10*3/uL (ref 3.9–10.3)

## 2015-01-08 LAB — COMPREHENSIVE METABOLIC PANEL (CC13)
ALBUMIN: 3.5 g/dL (ref 3.5–5.0)
ALT: 14 U/L (ref 0–55)
AST: 13 U/L (ref 5–34)
Alkaline Phosphatase: 75 U/L (ref 40–150)
Anion Gap: 10 mEq/L (ref 3–11)
BILIRUBIN TOTAL: 0.31 mg/dL (ref 0.20–1.20)
BUN: 21.5 mg/dL (ref 7.0–26.0)
CALCIUM: 9.4 mg/dL (ref 8.4–10.4)
CHLORIDE: 105 meq/L (ref 98–109)
CO2: 25 meq/L (ref 22–29)
Creatinine: 0.9 mg/dL (ref 0.6–1.1)
EGFR: 64 mL/min/{1.73_m2} — AB (ref >90)
GLUCOSE: 96 mg/dL (ref 70–140)
Potassium: 4.4 mEq/L (ref 3.5–5.1)
SODIUM: 140 meq/L (ref 136–145)
TOTAL PROTEIN: 6.7 g/dL (ref 6.4–8.3)

## 2015-01-08 MED ORDER — DENOSUMAB 60 MG/ML ~~LOC~~ SOLN
60.0000 mg | SUBCUTANEOUS | Status: DC
  Administered 2015-01-08: 60 mg via SUBCUTANEOUS
  Filled 2015-01-08: qty 1

## 2015-01-08 NOTE — Patient Instructions (Signed)
Denosumab injection What is this medicine? DENOSUMAB (den oh sue mab) slows bone breakdown. Prolia is used to treat osteoporosis in women after menopause and in men. Xgeva is used to prevent bone fractures and other bone problems caused by cancer bone metastases. Xgeva is also used to treat giant cell tumor of the bone. This medicine may be used for other purposes; ask your health care provider or pharmacist if you have questions. COMMON BRAND NAME(S): Prolia, XGEVA What should I tell my health care provider before I take this medicine? They need to know if you have any of these conditions: -dental disease -eczema -infection or history of infections -kidney disease or on dialysis -low blood calcium or vitamin D -malabsorption syndrome -scheduled to have surgery or tooth extraction -taking medicine that contains denosumab -thyroid or parathyroid disease -an unusual reaction to denosumab, other medicines, foods, dyes, or preservatives -pregnant or trying to get pregnant -breast-feeding How should I use this medicine? This medicine is for injection under the skin. It is given by a health care professional in a hospital or clinic setting. If you are getting Prolia, a special MedGuide will be given to you by the pharmacist with each prescription and refill. Be sure to read this information carefully each time. For Prolia, talk to your pediatrician regarding the use of this medicine in children. Special care may be needed. For Xgeva, talk to your pediatrician regarding the use of this medicine in children. While this drug may be prescribed for children as young as 13 years for selected conditions, precautions do apply. Overdosage: If you think you've taken too much of this medicine contact a poison control center or emergency room at once. Overdosage: If you think you have taken too much of this medicine contact a poison control center or emergency room at once. NOTE: This medicine is only for  you. Do not share this medicine with others. What if I miss a dose? It is important not to miss your dose. Call your doctor or health care professional if you are unable to keep an appointment. What may interact with this medicine? Do not take this medicine with any of the following medications: -other medicines containing denosumab This medicine may also interact with the following medications: -medicines that suppress the immune system -medicines that treat cancer -steroid medicines like prednisone or cortisone This list may not describe all possible interactions. Give your health care provider a list of all the medicines, herbs, non-prescription drugs, or dietary supplements you use. Also tell them if you smoke, drink alcohol, or use illegal drugs. Some items may interact with your medicine. What should I watch for while using this medicine? Visit your doctor or health care professional for regular checks on your progress. Your doctor or health care professional may order blood tests and other tests to see how you are doing. Call your doctor or health care professional if you get a cold or other infection while receiving this medicine. Do not treat yourself. This medicine may decrease your body's ability to fight infection. You should make sure you get enough calcium and vitamin D while you are taking this medicine, unless your doctor tells you not to. Discuss the foods you eat and the vitamins you take with your health care professional. See your dentist regularly. Brush and floss your teeth as directed. Before you have any dental work done, tell your dentist you are receiving this medicine. Do not become pregnant while taking this medicine or for 5 months after stopping   it. Women should inform their doctor if they wish to become pregnant or think they might be pregnant. There is a potential for serious side effects to an unborn child. Talk to your health care professional or pharmacist for more  information. What side effects may I notice from receiving this medicine? Side effects that you should report to your doctor or health care professional as soon as possible: -allergic reactions like skin rash, itching or hives, swelling of the face, lips, or tongue -breathing problems -chest pain -fast, irregular heartbeat -feeling faint or lightheaded, falls -fever, chills, or any other sign of infection -muscle spasms, tightening, or twitches -numbness or tingling -skin blisters or bumps, or is dry, peels, or red -slow healing or unexplained pain in the mouth or jaw -unusual bleeding or bruising Side effects that usually do not require medical attention (Report these to your doctor or health care professional if they continue or are bothersome.): -muscle pain -stomach upset, gas This list may not describe all possible side effects. Call your doctor for medical advice about side effects. You may report side effects to FDA at 1-800-FDA-1088. Where should I keep my medicine? This medicine is only given in a clinic, doctor's office, or other health care setting and will not be stored at home. NOTE: This sheet is a summary. It may not cover all possible information. If you have questions about this medicine, talk to your doctor, pharmacist, or health care provider.  2015, Elsevier/Gold Standard. (2011-12-21 12:37:47)  

## 2015-01-08 NOTE — Progress Notes (Signed)
Patient Care Team: Kathyrn Lass, MD as PCP - General  DIAGNOSIS: No matching staging information was found for the patient.  SUMMARY OF ONCOLOGIC HISTORY:   Primary cancer of upper outer quadrant of left female breast   12/18/2011 Surgery Left mastectomy: 2 foci of well-differentiated invasive ductal carcinoma 1.7 cm, 0.7 cm with DCIS, lymphovascular invasion present, one positive SLN with ECS, ER/PR positive, HER-2 negative, Ki-67 13%; 0.7 cm tumor: ER 100, PR 7% Her 2Pos 4.94, Ki 50%   02/09/2012 - 02/06/2013 Chemotherapy Adjuvant chemotherapy Taxol Herceptin X 10 discontinued due to intolerance followed by Herceptin maintenance   04/26/2012 - 06/10/2012 Radiation Therapy Adjuvant radiation therapy   06/10/2012 -  Anti-estrogen oral therapy Letrozole 2.5 mg daily    CHIEF COMPLIANT: follow-up of left breast cancer  INTERVAL HISTORY: Brandy [name] is a 81 year old above-mentioned history of breast cancer treated with mastectomy followed by adjuvant chemotherapy and radiation and is currently on letrozole since December 2013. She is tolerating with very well. She has osteoporosis for which she is on Prolia and calcified vitamin D. Denies any new problems or concerns.  REVIEW OF SYSTEMS:   Constitutional: Denies fevers, chills or abnormal weight loss Eyes: Denies blurriness of vision Ears, nose, mouth, throat, and face: Denies mucositis or sore throat Respiratory: Denies cough, dyspnea or wheezes Cardiovascular: Denies palpitation, chest discomfort or lower extremity swelling Gastrointestinal:  Denies nausea, heartburn or change in bowel habits Skin: Denies abnormal skin rashes Lymphatics: Denies new lymphadenopathy or easy bruising Neurological:Denies numbness, tingling or new weaknesses Behavioral/Psych: Mood is stable, no new changes  Breast: slight discomfort in the left chest wall All other systems were reviewed with the patient and are negative.  I have reviewed the past medical  history, past surgical history, social history and family history with the patient and they are unchanged from previous note.  ALLERGIES:  has No Known Allergies.  MEDICATIONS:  Current Outpatient Prescriptions  Medication Sig Dispense Refill  . amLODipine (NORVASC) 10 MG tablet Take 10 mg by mouth every morning.     Marland Kitchen atorvastatin (LIPITOR) 40 MG tablet Take 40 mg by mouth daily.  1  . citalopram (CELEXA) 20 MG tablet Take 20 mg by mouth every morning.     . gabapentin (NEURONTIN) 100 MG capsule TAKE 3 CAPSULES (300 MG TOTAL) BY MOUTH AT BEDTIME. 90 capsule 0  . letrozole (FEMARA) 2.5 MG tablet Take 1 tablet (2.5 mg total) by mouth daily. 90 tablet 2  . omeprazole (PRILOSEC) 20 MG capsule Take 20 mg by mouth daily.    . simvastatin (ZOCOR) 20 MG tablet Take 20 mg by mouth daily.     Marland Kitchen spironolactone (ALDACTONE) 25 MG tablet Take 25 mg by mouth every morning.    Marland Kitchen spironolactone (ALDACTONE) 25 MG tablet TAKE 0.5 TABLETS (12.5 MG TOTAL) BY MOUTH DAILY. 30 tablet 4  . valsartan-hydrochlorothiazide (DIOVAN-HCT) 160-12.5 MG per tablet Take 1 tablet by mouth every morning.     . zolpidem (AMBIEN) 10 MG tablet Take 10 mg by mouth at bedtime.     No current facility-administered medications for this visit.   Facility-Administered Medications Ordered in Other Visits  Medication Dose Route Frequency Provider Last Rate Last Dose  . sodium chloride 0.9 % injection 10 mL  10 mL Intracatheter PRN Consuela Mimes, MD   10 mL at 03/14/12 1628    PHYSICAL EXAMINATION: ECOG PERFORMANCE STATUS: 1 - Symptomatic but completely ambulatory  Filed Vitals:   01/08/15 1518  BP: 148/60  Pulse: 68  Temp: 98.3 F (36.8 C)  Resp: 18   Filed Weights   01/08/15 1518  Weight: 170 lb 9.6 oz (77.384 kg)    GENERAL:alert, no distress and comfortable SKIN: skin color, texture, turgor are normal, no rashes or significant lesions EYES: normal, Conjunctiva are pink and non-injected, sclera clear OROPHARYNX:no  exudate, no erythema and lips, buccal mucosa, and tongue normal  NECK: supple, thyroid normal size, non-tender, without nodularity LYMPH:  no palpable lymphadenopathy in the cervical, axillary or inguinal LUNGS: clear to auscultation and percussion with normal breathing effort HEART: regular rate & rhythm and no murmurs and no lower extremity edema ABDOMEN:abdomen soft, non-tender and normal bowel sounds Musculoskeletal:no cyanosis of digits and no clubbing  NEURO: alert & oriented x 3 with fluent speech, no focal motor/sensory deficits BREAST:no palpable nodules in the right breast or axilla, left chest wall scars been palpated no axillary lymphadenopathy.. (exam performed in the presence of a chaperone)  LABORATORY DATA:  I have reviewed the data as listed   Chemistry      Component Value Date/Time   NA 140 01/08/2015 1439   NA 138 12/29/2013 0715   K 4.4 01/08/2015 1439   K 3.3* 12/29/2013 0715   CL 99 12/29/2013 0715   CL 108* 12/28/2012 1343   CO2 25 01/08/2015 1439   CO2 25 12/29/2013 0715   BUN 21.5 01/08/2015 1439   BUN 11 12/29/2013 0715   CREATININE 0.9 01/08/2015 1439   CREATININE 0.59 12/29/2013 0715      Component Value Date/Time   CALCIUM 9.4 01/08/2015 1439   CALCIUM 8.8 12/29/2013 0715   ALKPHOS 75 01/08/2015 1439   ALKPHOS 74 03/02/2014 1429   AST 13 01/08/2015 1439   AST 23 03/02/2014 1429   ALT 14 01/08/2015 1439   ALT 17 03/02/2014 1429   BILITOT 0.31 01/08/2015 1439   BILITOT 0.6 03/02/2014 1429       Lab Results  Component Value Date   WBC 9.8 01/08/2015   HGB 11.1* 01/08/2015   HCT 32.9* 01/08/2015   MCV 87.3 01/08/2015   PLT 243 01/08/2015   NEUTROABS 5.8 01/08/2015    ASSESSMENT & PLAN:  Primary cancer of upper outer quadrant of left female breast Left breast cancer multifocal disease 1.7 cm and 0.7 cm, one sentinel lymph node positive with focal extracapsular extension: 1.7 cm mass was ER/PR positive and HER-2 negative with a Ki-67 of  30%: The 0.7 cm mass was ER 100%, PR 7%, Ki-67 15%, HER-2 positive ratio 4.94, status post chemotherapy with Taxol Herceptin from 02/09/2012 to 04/04/2012 discontinued early due to Taxol side effects followed by Herceptin maintenance followed by radiation therapy and adjuvant letrozole was started December 2013  Letrozole toxicities: 1. Osteopenia T score -2.4 continue with Prolia with calcium and vitamin D  Breast Cancer Surveillance: 1. Breast exam 01/08/2015: Normal 2. Mammogram 01/08/2014 No abnormalities. Postsurgical changes. Breast Density Category A. I recommended that she get 3-D mammograms for surveillance. Discussed the differences between different breast density categories.  Return to clinic in 1 year for follow-up   No orders of the defined types were placed in this encounter.   The patient has a good understanding of the overall plan. she agrees with it. she will call with any problems that may develop before the next visit here.   Rulon Eisenmenger, MD

## 2015-01-08 NOTE — Telephone Encounter (Signed)
Appointments made and avs printed for patient °

## 2015-01-08 NOTE — Assessment & Plan Note (Signed)
Left breast cancer multifocal disease 1.7 cm and 0.7 cm, one sentinel lymph node positive with focal extracapsular extension: 1.7 cm mass was ER/PR positive and HER-2 negative with a Ki-67 of 30%: The 0.7 cm mass was ER 100%, PR 7%, Ki-67 15%, HER-2 positive ratio 4.94, status post chemotherapy with Taxol Herceptin from 02/09/2012 to 04/04/2012 discontinued early due to Taxol side effects followed by Herceptin maintenance followed by radiation therapy and adjuvant letrozole was started December 2013  Letrozole toxicities: 1. Osteopenia T score -2.4 continue with Prolia with calcium and vitamin D  Breast Cancer Surveillance: 1. Breast exam 01/08/2015: Normal 2. Mammogram 01/08/2014 No abnormalities. Postsurgical changes. Breast Density Category A. I recommended that she get 3-D mammograms for surveillance. Discussed the differences between different breast density categories.  Return to clinic in 1 year for follow-up

## 2015-01-20 ENCOUNTER — Other Ambulatory Visit: Payer: Self-pay | Admitting: Hematology and Oncology

## 2015-01-21 NOTE — Telephone Encounter (Signed)
Last OV 01/08/15.  Next OV 01/06/16.  Chart reviewed.

## 2015-02-18 ENCOUNTER — Other Ambulatory Visit: Payer: Self-pay | Admitting: Hematology and Oncology

## 2015-02-18 DIAGNOSIS — C50412 Malignant neoplasm of upper-outer quadrant of left female breast: Secondary | ICD-10-CM

## 2015-03-06 ENCOUNTER — Other Ambulatory Visit (HOSPITAL_COMMUNITY): Payer: Self-pay | Admitting: Adult Health

## 2015-05-23 DIAGNOSIS — G479 Sleep disorder, unspecified: Secondary | ICD-10-CM | POA: Diagnosis not present

## 2015-06-20 ENCOUNTER — Other Ambulatory Visit: Payer: Self-pay | Admitting: Nurse Practitioner

## 2015-06-25 ENCOUNTER — Other Ambulatory Visit: Payer: Self-pay | Admitting: *Deleted

## 2015-06-25 DIAGNOSIS — C50412 Malignant neoplasm of upper-outer quadrant of left female breast: Secondary | ICD-10-CM

## 2015-06-25 MED ORDER — GABAPENTIN 300 MG PO CAPS: 300.0000 mg | ORAL_CAPSULE | Freq: Every day | ORAL | Status: DC

## 2015-07-05 ENCOUNTER — Encounter: Payer: Self-pay | Admitting: Hematology and Oncology

## 2015-07-05 NOTE — Progress Notes (Signed)
I faxed office notes to- [phone] [employer] for prolia pre cert for [id number] per cassidy [phone] is ph

## 2015-07-09 ENCOUNTER — Ambulatory Visit (HOSPITAL_BASED_OUTPATIENT_CLINIC_OR_DEPARTMENT_OTHER): Payer: Medicare HMO

## 2015-07-09 ENCOUNTER — Other Ambulatory Visit: Payer: Self-pay | Admitting: Hematology and Oncology

## 2015-07-09 ENCOUNTER — Ambulatory Visit: Payer: Medicare HMO

## 2015-07-09 DIAGNOSIS — C50412 Malignant neoplasm of upper-outer quadrant of left female breast: Secondary | ICD-10-CM | POA: Diagnosis not present

## 2015-07-09 DIAGNOSIS — M8580 Other specified disorders of bone density and structure, unspecified site: Secondary | ICD-10-CM

## 2015-07-09 LAB — COMPREHENSIVE METABOLIC PANEL
ALT: 13 U/L (ref 0–55)
ANION GAP: 8 meq/L (ref 3–11)
AST: 16 U/L (ref 5–34)
Albumin: 3.4 g/dL — ABNORMAL LOW (ref 3.5–5.0)
Alkaline Phosphatase: 68 U/L (ref 40–150)
BUN: 19.8 mg/dL (ref 7.0–26.0)
CHLORIDE: 107 meq/L (ref 98–109)
CO2: 24 mEq/L (ref 22–29)
Calcium: 9.4 mg/dL (ref 8.4–10.4)
Creatinine: 0.8 mg/dL (ref 0.6–1.1)
EGFR: 74 mL/min/{1.73_m2} — AB (ref >90)
Glucose: 133 mg/dl (ref 70–140)
POTASSIUM: 3.9 meq/L (ref 3.5–5.1)
Sodium: 140 mEq/L (ref 136–145)
Total Bilirubin: <0.3 mg/dL (ref 0.20–1.20)
Total Protein: 7 g/dL (ref 6.4–8.3)

## 2015-07-10 ENCOUNTER — Ambulatory Visit (HOSPITAL_BASED_OUTPATIENT_CLINIC_OR_DEPARTMENT_OTHER): Payer: Medicare HMO

## 2015-07-10 VITALS — BP 154/56 | HR 66 | Temp 98.7°F

## 2015-07-10 DIAGNOSIS — C50412 Malignant neoplasm of upper-outer quadrant of left female breast: Secondary | ICD-10-CM

## 2015-07-10 DIAGNOSIS — M8580 Other specified disorders of bone density and structure, unspecified site: Secondary | ICD-10-CM | POA: Diagnosis not present

## 2015-07-10 DIAGNOSIS — Z23 Encounter for immunization: Secondary | ICD-10-CM

## 2015-07-10 MED ORDER — DENOSUMAB 60 MG/ML ~~LOC~~ SOLN
60.0000 mg | Freq: Once | SUBCUTANEOUS | Status: AC
  Administered 2015-07-10: 60 mg via SUBCUTANEOUS
  Filled 2015-07-10: qty 1

## 2015-07-10 MED ORDER — INFLUENZA VAC SPLIT QUAD 0.5 ML IM SUSY
0.5000 mL | PREFILLED_SYRINGE | Freq: Once | INTRAMUSCULAR | Status: AC
  Administered 2015-07-10: 0.5 mL via INTRAMUSCULAR
  Filled 2015-07-10: qty 0.5

## 2015-07-17 ENCOUNTER — Other Ambulatory Visit: Payer: Self-pay | Admitting: Hematology and Oncology

## 2015-07-17 NOTE — Telephone Encounter (Signed)
Chart reviewed.

## 2015-07-23 DIAGNOSIS — M542 Cervicalgia: Secondary | ICD-10-CM | POA: Diagnosis not present

## 2015-07-23 DIAGNOSIS — R51 Headache: Secondary | ICD-10-CM | POA: Diagnosis not present

## 2015-07-26 ENCOUNTER — Ambulatory Visit
Admission: RE | Admit: 2015-07-26 | Discharge: 2015-07-26 | Disposition: A | Discharge status: Home / Self Care | Payer: Medicare HMO | Source: Ambulatory Visit | Attending: Family Medicine | Admitting: Family Medicine

## 2015-07-26 ENCOUNTER — Other Ambulatory Visit: Payer: Self-pay | Admitting: Family Medicine

## 2015-07-26 DIAGNOSIS — M542 Cervicalgia: Secondary | ICD-10-CM

## 2015-07-26 DIAGNOSIS — M47812 Spondylosis without myelopathy or radiculopathy, cervical region: Secondary | ICD-10-CM | POA: Diagnosis not present

## 2015-08-21 ENCOUNTER — Emergency Department (HOSPITAL_COMMUNITY): Payer: Medicare HMO

## 2015-08-21 ENCOUNTER — Emergency Department (HOSPITAL_COMMUNITY)
Admission: EM | Admit: 2015-08-21 | Discharge: 2015-08-21 | Disposition: A | Discharge status: Home / Self Care | Payer: Medicare HMO | Attending: Emergency Medicine | Admitting: Emergency Medicine

## 2015-08-21 ENCOUNTER — Encounter (HOSPITAL_COMMUNITY): Payer: Self-pay

## 2015-08-21 DIAGNOSIS — I10 Essential (primary) hypertension: Secondary | ICD-10-CM | POA: Diagnosis not present

## 2015-08-21 DIAGNOSIS — J45909 Unspecified asthma, uncomplicated: Secondary | ICD-10-CM | POA: Diagnosis not present

## 2015-08-21 DIAGNOSIS — S161XXA Strain of muscle, fascia and tendon at neck level, initial encounter: Secondary | ICD-10-CM | POA: Insufficient documentation

## 2015-08-21 DIAGNOSIS — S199XXA Unspecified injury of neck, initial encounter: Secondary | ICD-10-CM | POA: Diagnosis not present

## 2015-08-21 DIAGNOSIS — Y998 Other external cause status: Secondary | ICD-10-CM | POA: Insufficient documentation

## 2015-08-21 DIAGNOSIS — S0012XA Contusion of left eyelid and periocular area, initial encounter: Secondary | ICD-10-CM | POA: Insufficient documentation

## 2015-08-21 DIAGNOSIS — S3993XA Unspecified injury of pelvis, initial encounter: Secondary | ICD-10-CM | POA: Diagnosis not present

## 2015-08-21 DIAGNOSIS — S0993XA Unspecified injury of face, initial encounter: Secondary | ICD-10-CM | POA: Diagnosis present

## 2015-08-21 DIAGNOSIS — S0990XA Unspecified injury of head, initial encounter: Secondary | ICD-10-CM | POA: Insufficient documentation

## 2015-08-21 DIAGNOSIS — M19012 Primary osteoarthritis, left shoulder: Secondary | ICD-10-CM | POA: Diagnosis not present

## 2015-08-21 DIAGNOSIS — R011 Cardiac murmur, unspecified: Secondary | ICD-10-CM | POA: Insufficient documentation

## 2015-08-21 DIAGNOSIS — S098XXA Other specified injuries of head, initial encounter: Secondary | ICD-10-CM | POA: Diagnosis not present

## 2015-08-21 DIAGNOSIS — Y9289 Other specified places as the place of occurrence of the external cause: Secondary | ICD-10-CM | POA: Diagnosis not present

## 2015-08-21 DIAGNOSIS — W01198A Fall on same level from slipping, tripping and stumbling with subsequent striking against other object, initial encounter: Secondary | ICD-10-CM | POA: Insufficient documentation

## 2015-08-21 DIAGNOSIS — M19011 Primary osteoarthritis, right shoulder: Secondary | ICD-10-CM | POA: Diagnosis not present

## 2015-08-21 DIAGNOSIS — Y9389 Activity, other specified: Secondary | ICD-10-CM | POA: Diagnosis not present

## 2015-08-21 DIAGNOSIS — S0232XA Fracture of orbital floor, left side, initial encounter for closed fracture: Secondary | ICD-10-CM | POA: Diagnosis not present

## 2015-08-21 DIAGNOSIS — Z853 Personal history of malignant neoplasm of breast: Secondary | ICD-10-CM | POA: Insufficient documentation

## 2015-08-21 DIAGNOSIS — S0083XA Contusion of other part of head, initial encounter: Secondary | ICD-10-CM | POA: Diagnosis not present

## 2015-08-21 DIAGNOSIS — Z79899 Other long term (current) drug therapy: Secondary | ICD-10-CM | POA: Insufficient documentation

## 2015-08-21 DIAGNOSIS — E785 Hyperlipidemia, unspecified: Secondary | ICD-10-CM | POA: Diagnosis not present

## 2015-08-21 DIAGNOSIS — W19XXXA Unspecified fall, initial encounter: Secondary | ICD-10-CM

## 2015-08-21 DIAGNOSIS — G4700 Insomnia, unspecified: Secondary | ICD-10-CM | POA: Diagnosis not present

## 2015-08-21 DIAGNOSIS — F0390 Unspecified dementia without behavioral disturbance: Secondary | ICD-10-CM | POA: Diagnosis not present

## 2015-08-21 DIAGNOSIS — Z87442 Personal history of urinary calculi: Secondary | ICD-10-CM | POA: Insufficient documentation

## 2015-08-21 DIAGNOSIS — S0292XA Unspecified fracture of facial bones, initial encounter for closed fracture: Secondary | ICD-10-CM | POA: Insufficient documentation

## 2015-08-21 DIAGNOSIS — K219 Gastro-esophageal reflux disease without esophagitis: Secondary | ICD-10-CM | POA: Insufficient documentation

## 2015-08-21 DIAGNOSIS — S299XXA Unspecified injury of thorax, initial encounter: Secondary | ICD-10-CM | POA: Diagnosis not present

## 2015-08-21 DIAGNOSIS — M542 Cervicalgia: Secondary | ICD-10-CM | POA: Diagnosis not present

## 2015-08-21 LAB — BASIC METABOLIC PANEL
ANION GAP: 12 (ref 5–15)
BUN: 17 mg/dL (ref 6–20)
CALCIUM: 8.3 mg/dL — AB (ref 8.9–10.3)
CO2: 21 mmol/L — ABNORMAL LOW (ref 22–32)
Chloride: 109 mmol/L (ref 101–111)
Creatinine, Ser: 0.76 mg/dL (ref 0.44–1.00)
GFR calc Af Amer: >60 mL/min (ref >60)
Glucose, Bld: 168 mg/dL — ABNORMAL HIGH (ref 65–99)
POTASSIUM: 4.1 mmol/L (ref 3.5–5.1)
SODIUM: 142 mmol/L (ref 135–145)

## 2015-08-21 LAB — CBC WITH DIFFERENTIAL/PLATELET
Basophils Absolute: 0 10*3/uL (ref 0.0–0.1)
Basophils Relative: 0 %
Eosinophils Absolute: 0.3 10*3/uL (ref 0.0–0.7)
Eosinophils Relative: 2 %
HCT: 34.1 % — ABNORMAL LOW (ref 36.0–46.0)
Hemoglobin: 10.7 g/dL — ABNORMAL LOW (ref 12.0–15.0)
Lymphocytes Relative: 17 %
Lymphs Abs: 1.9 10*3/uL (ref 0.7–4.0)
MCH: 27.7 pg (ref 26.0–34.0)
MCHC: 31.4 g/dL (ref 30.0–36.0)
MCV: 88.3 fL (ref 78.0–100.0)
Monocytes Absolute: 0.7 10*3/uL (ref 0.1–1.0)
Monocytes Relative: 6 %
Neutro Abs: 8.2 10*3/uL — ABNORMAL HIGH (ref 1.7–7.7)
Neutrophils Relative %: 75 %
Platelets: 238 10*3/uL (ref 150–400)
RBC: 3.86 MIL/uL — ABNORMAL LOW (ref 3.87–5.11)
RDW: 14 % (ref 11.5–15.5)
WBC: 11.1 10*3/uL — ABNORMAL HIGH (ref 4.0–10.5)

## 2015-08-21 LAB — I-STAT TROPONIN, ED: TROPONIN I, POC: 0 ng/mL (ref 0.00–0.08)

## 2015-08-21 LAB — CK: Total CK: 68 U/L (ref 38–234)

## 2015-08-21 MED ORDER — SODIUM CHLORIDE 0.9 % IV BOLUS (SEPSIS)
500.0000 mL | Freq: Once | INTRAVENOUS | Status: AC
  Administered 2015-08-21: 500 mL via INTRAVENOUS

## 2015-08-21 MED ORDER — CEPHALEXIN 500 MG PO CAPS: 500.0000 mg | ORAL_CAPSULE | Freq: Four times a day (QID) | ORAL | Status: DC

## 2015-08-21 MED ORDER — HYDROCODONE-ACETAMINOPHEN 5-325 MG PO TABS: 1.0000 | ORAL_TABLET | Freq: Four times a day (QID) | ORAL | Status: DC | PRN

## 2015-08-21 NOTE — ED Notes (Signed)
Pt returned to room from imaging. 

## 2015-08-21 NOTE — ED Notes (Signed)
Pt follow up and presciptions reviewed with pt and family, family and pt verbalized understanding, no further questions at this time.  Neuro intact, at baseline.  Pt ambulatory.  Discharged with daughter and husband.

## 2015-08-21 NOTE — ED Notes (Signed)
Pt transported to Xray and CT on monitor and with RN at bedside, pt tolerated well.  Family at bedside

## 2015-08-21 NOTE — ED Notes (Signed)
Per EMS - pt awakened by husband at 0600 to use restroom. Pt does not ambulate well on her own. Pt fell once w/o incident, able to get pt up to chair. Pt tried to get up again, fell forward face-first onto thin carpet covering hardwood floor. Pt swollen left eye and bloody drainage from nose. Bleeding controlled. Pt does not remember event. Pt a&o x 4. Denies neck/back pain. C/o headache 6/10 pain. Reports nausea - given 4mg  zofran. No other obvious injuries or complaints at this time. Hx abdominal cancer and lymph node removed from right chest.

## 2015-08-21 NOTE — ED Notes (Signed)
Pt ambulated with standby assistance to restroom, pt tolerated well.  Gait steady.

## 2015-08-21 NOTE — Discharge Instructions (Signed)
Apply ice to the area for 20 minutes every 2 hours while awake for the next 2 days.  Keflex as prescribed.  Hydrocodone as prescribed as needed for pain.  You're to follow-up with Dr. Erik Obey in the ear nose and throat clinic in 1 week. His contact information has been provided in this discharge summary for you to call and arrange this appointment.  You are also to follow-up with Dr. Prudencio Burly from ophthalmology in one week. His contact information has been provided as well. Please call to arrange this appointment   Head Injury, Adult You have a head injury. Headaches and throwing up (vomiting) are common after a head injury. It should be easy to wake up from sleeping. Sometimes you must stay in the hospital. Most problems happen within the first 24 hours. Side effects may occur up to 7-10 days after the injury.  WHAT ARE THE TYPES OF HEAD INJURIES? Head injuries can be as minor as a bump. Some head injuries can be more severe. More severe head injuries include:  A jarring injury to the brain (concussion).  A bruise of the brain (contusion). This mean there is bleeding in the brain that can cause swelling.  A cracked skull (skull fracture).  Bleeding in the brain that collects, clots, and forms a bump (hematoma). WHEN SHOULD I GET HELP RIGHT AWAY?   You are confused or sleepy.  You cannot be woken up.  You feel sick to your stomach (nauseous) or keep throwing up (vomiting).  Your dizziness or unsteadiness is getting worse.  You have very bad, lasting headaches that are not helped by medicine. Take medicines only as told by your doctor.  You cannot use your arms or legs like normal.  You cannot walk.  You notice changes in the black spots in the center of the colored part of your eye (pupil).  You have clear or bloody fluid coming from your nose or ears.  You have trouble seeing. During the next 24 hours after the injury, you must stay with someone who can watch you. This  person should get help right away (call 911 in the [place] if you start to shake and are not able to control it (have seizures), you pass out, or you are unable to wake up. HOW CAN I PREVENT A HEAD INJURY IN THE FUTURE?  Wear seat belts.  Wear a helmet while bike riding and playing sports like football.  Stay away from dangerous activities around the house. WHEN CAN I RETURN TO NORMAL ACTIVITIES AND ATHLETICS? See your doctor before doing these activities. You should not do normal activities or play contact sports until 1 week after the following symptoms have stopped:  Headache that does not go away.  Dizziness.  Poor attention.  Confusion.  Memory problems.  Sickness to your stomach or throwing up.  Tiredness.  Fussiness.  Bothered by bright lights or loud noises.  Anxiousness or depression.  Restless sleep. MAKE SURE YOU:   Understand these instructions.  Will watch your condition.  Will get help right away if you are not doing well or get worse.   This information is not intended to replace advice given to you by your health care provider. Make sure you discuss any questions you have with your health care provider.   Document Released: 06/04/2008 Document Revised: 07/13/2014 Document Reviewed: 02/27/2013 Elsevier Interactive Patient Education [employer].

## 2015-08-21 NOTE — ED Provider Notes (Signed)
CSN: [id number]     Arrival date & time 08/21/15  0825 History   First MD Initiated Contact with Patient 08/21/15 [id number]     Chief Complaint  Patient presents with  . Fall  . Loss of Consciousness     (Consider location/radiation/quality/duration/timing/severity/associated sxs/prior Treatment) HPI Comments: Patient is an 82 year old female brought by EMS after a fall. Patient has a history of dementia and history is somewhat limited due to this. What was reported by EMS is that this patient woke up to use the restroom and fell. She was found by her husband who assisted her to a chair. She then fell again and struck her face on the floor. She is having swelling to her left eye and bleeding from the nose. Patient has no recollection of the fall. She does report headache and nausea.  Patient is a 82 y.o. female presenting with fall. The history is provided by the patient.  Fall This is a new problem. Episode onset: Unknown. The problem occurs constantly. The problem has not changed since onset.Nothing aggravates the symptoms. Nothing relieves the symptoms. She has tried nothing for the symptoms.    Past Medical History  Diagnosis Date  . Hyperlipidemia     takes Simvastatin daily  . Hypertension     takes Amlodipine and Diovan daily  . Cancer ([place])     left breast  . Bronchitis     hx of;last time >61yr ago  . Arthritis     shoulders  . Dysphagia   . H/O hiatal hernia   . GERD (gastroesophageal reflux disease)     takes Prilosec prn  . Urinary urgency   . History of kidney stones     many yrs ago  . Depression     takes Celexa daily  . Insomnia     takes Ambien nightly  . Breast cancer ([place]) 12/18/11    left breast masectomy=metastatic ca in (1/1) lymph node ,invasive ductal ca,2 foci,,dcis,lymph ovascular invasion identified,surgical resection margins neg for ca,additional tissue=benign skin and subcutaneous tissue  . Breast CA ([place]) 11/23/11    left breast 3 o'clock bx=high  grade ductal ca in situ w/comedononecrosis and calcification,dcis,lymphovascular invasion present ,ER?PR+positive  . Full dentures   . Hx of radiation therapy 04/26/12 -06/10/12    left breast  . Asthma     mild, no inhalers used  . Gall stones 2015  . Difficult intubation    Past Surgical History  Procedure Laterality Date  . Total shoulder replacement  2011    left  . Eus  06/04/2011    Procedure: UPPER ENDOSCOPIC ULTRASOUND (EUS) LINEAR;  Surgeon: Owens Loffler, MD;  Location: [facility];  Service: Endoscopy;  Laterality: N/A;  . Exploratory laparotomy       biopsy of intra-abdominal mass  . Bladder tack    . Tubal ligation    . Appendectomy    . Dilation and curettage of uterus    . Esophagogastroduodenoscopy    . Mastectomy w/ sentinel node biopsy  12/18/2011    Procedure: MASTECTOMY WITH SENTINEL LYMPH NODE BIOPSY;  Surgeon: Rolm Bookbinder, MD;  Location: [facility];  Service: General;  Laterality: Left;  . Portacath placement  01/27/2012    Procedure: INSERTION PORT-A-CATH;  Surgeon: Rolm Bookbinder, MD;  Location: [facility];  Service: General;  Laterality: Right;  PORT PLACEMENT  . Port-a-cath removal Right 06/15/2013    Procedure: REMOVAL PORT-A-CATH;  Surgeon: Rolm Bookbinder, MD;  Location: [facility];  Service: General;  Laterality: Right;  . Esophagogastroduodenoscopy (egd) with propofol  12/19/2013    Procedure: ESOPHAGOGASTRODUODENOSCOPY (EGD) WITH PROPOFOL;  Surgeon: Beryle Beams, MD;  Location: [facility] ENDOSCOPY;  Service: Endoscopy;;  . Breast biopsy  1998    left   . Eus N/A 02/01/2014    Procedure: UPPER ENDOSCOPIC ULTRASOUND (EUS) LINEAR;  Surgeon: [name], MD;  Location: [facility];  Service: Endoscopy;  Laterality: N/A;  . Endoscopic retrograde cholangiopancreatography (ercp) with propofol N/A 02/01/2014    Procedure: ENDOSCOPIC RETROGRADE CHOLANGIOPANCREATOGRAPHY (ERCP) WITH PROPOFOL;  Surgeon: [name], MD;   Location: [facility];  Service: Endoscopy;  Laterality: N/A;   Family History  Problem Relation Age of Onset  . Prostate cancer Father   . Breast cancer Other    Social History  Substance Use Topics  . Smoking status: Never Smoker   . Smokeless tobacco: Never Used  . Alcohol Use: No   OB History    No data available     Review of Systems  All other systems reviewed and are negative.     Allergies  Review of patient's allergies indicates no known allergies.  Home Medications   Prior to Admission medications   Medication Sig Start Date End Date Taking? Authorizing Provider  amLODipine (NORVASC) 10 MG tablet Take 10 mg by mouth every morning.  02/13/11   Historical Provider, MD  atorvastatin (LIPITOR) 40 MG tablet Take 40 mg by mouth daily. 04/09/14   Historical Provider, MD  citalopram (CELEXA) 20 MG tablet Take 20 mg by mouth every morning.     Historical Provider, MD  gabapentin (NEURONTIN) 100 MG capsule TAKE 3 CAPSULES BY MOUTH AT BEDTIME 07/17/15   Nicholas Lose, MD  gabapentin (NEURONTIN) 300 MG capsule Take 1 capsule (300 mg total) by mouth at bedtime. 06/25/15   Nicholas Lose, MD  letrozole (FEMARA) 2.5 MG tablet Take 1 tablet (2.5 mg total) by mouth daily. 09/18/14   Nicholas Lose, MD  omeprazole (PRILOSEC) 20 MG capsule Take 20 mg by mouth daily.    Historical Provider, MD  simvastatin (ZOCOR) 20 MG tablet Take 20 mg by mouth daily.  02/16/11   Historical Provider, MD  spironolactone (ALDACTONE) 25 MG tablet Take 25 mg by mouth every morning.    Historical Provider, MD  spironolactone (ALDACTONE) 25 MG tablet TAKE 1/2 TABLET BY MOUTH EVERY DAY 03/06/15   [name] D [name], NP  valsartan-hydrochlorothiazide (DIOVAN-HCT) 160-12.5 MG per tablet Take 1 tablet by mouth every morning.     Historical Provider, MD  zolpidem (AMBIEN) 10 MG tablet Take 10 mg by mouth at bedtime.    Historical Provider, MD   SpO2 94% Physical Exam  Constitutional: She is oriented to person, place, and  time. She appears well-developed and well-nourished.  Patient is an elderly female in no acute distress. She is somewhat somnolent, however is arousable and will respond appropriately to questions and commands.  HENT:  Head: Normocephalic.  There is swelling and ecchymosis around the left eye. There is also swelling of the nose and clotted blood within the left nares. There is no active bleeding at this time.  Eyes: EOM are normal. Pupils are equal, round, and reactive to light.  Neck:  Neck is immobilized in a cervical collar. There is no obvious step-off.  Cardiovascular: Normal rate and regular rhythm.  Exam reveals no gallop and no friction rub.   Murmur heard. There is a 3/6 systolic ejection murmur heard best at the left lower  sternal border.  Pulmonary/Chest: Effort normal and breath sounds normal. No respiratory distress. She has no wheezes. She has no rales.  Abdominal: Soft. Bowel sounds are normal. She exhibits no distension. There is no tenderness.  Musculoskeletal: Normal range of motion. She exhibits no edema.  Neurological: She is alert and oriented to person, place, and time. No cranial nerve deficit. She exhibits normal muscle tone. Coordination normal.  Patient is somewhat somnolent, but will respond appropriately to questions and commands.  Skin: Skin is warm and dry.  Nursing note and vitals reviewed.   ED Course  Procedures (including critical care time) Labs Review Labs Reviewed  BASIC METABOLIC PANEL  CBC WITH DIFFERENTIAL/PLATELET  CK  I-STAT TROPOININ, ED    Imaging Review No results found. I have personally reviewed and evaluated these images and lab results as part of my medical decision-making.   EKG Interpretation   Date/Time:  Wednesday August 21 2015 08:42:15 EST Ventricular Rate:  90 PR Interval:  162 QRS Duration: 99 QT Interval:  387 QTC Calculation: 473 R Axis:   50 Text Interpretation:  Sinus rhythm Probable LVH with secondary repol  abnrm  Confirmed by Arhan Mcmanamon  MD, Vicktoria Muckey ([id number]) on 08/21/2015 8:46:37 AM      MDM   Final diagnoses:  None    Patient presents here after a fall while walking to the bathroom. She has swelling and ecchymosis around her left eye and blood coming from her nose. Imaging studies revealed multiple facial fractures including the maxillary sinus and inferior and lateral orbit walls. She also has a zygoma fracture and nasal bone fractures. Head CT is unremarkable and cervical spine CT is negative. X-rays were also obtained of the pelvis and chest which were negative. I've discussed these facial fractures with Dr. Erik Obey from ENT who is recommending antibiotics, ice, pain medication, and follow-up with both ENT and ophthalmology in the next week. The patient was ambulated and appeared to tolerate this well. The husband and daughter present at bedside are comfortable with the disposition.    Veryl Speak, MD 08/21/15 1236

## 2015-08-21 NOTE — ED Notes (Signed)
Pt transported to CT and x-ray  

## 2015-08-27 DIAGNOSIS — H1132 Conjunctival hemorrhage, left eye: Secondary | ICD-10-CM | POA: Diagnosis not present

## 2015-08-27 DIAGNOSIS — S022XXA Fracture of nasal bones, initial encounter for closed fracture: Secondary | ICD-10-CM | POA: Diagnosis not present

## 2015-08-27 DIAGNOSIS — S02402A Zygomatic fracture, unspecified, initial encounter for closed fracture: Secondary | ICD-10-CM | POA: Diagnosis not present

## 2015-08-29 DIAGNOSIS — M542 Cervicalgia: Secondary | ICD-10-CM | POA: Diagnosis not present

## 2015-08-29 DIAGNOSIS — M5030 Other cervical disc degeneration, unspecified cervical region: Secondary | ICD-10-CM | POA: Diagnosis not present

## 2015-08-29 DIAGNOSIS — S0292XD Unspecified fracture of facial bones, subsequent encounter for fracture with routine healing: Secondary | ICD-10-CM | POA: Diagnosis not present

## 2015-08-29 DIAGNOSIS — W19XXXD Unspecified fall, subsequent encounter: Secondary | ICD-10-CM | POA: Diagnosis not present

## 2015-09-06 DIAGNOSIS — G8929 Other chronic pain: Secondary | ICD-10-CM | POA: Diagnosis not present

## 2015-09-06 DIAGNOSIS — Z9181 History of falling: Secondary | ICD-10-CM | POA: Diagnosis not present

## 2015-09-06 DIAGNOSIS — M542 Cervicalgia: Secondary | ICD-10-CM | POA: Diagnosis not present

## 2015-09-06 DIAGNOSIS — R269 Unspecified abnormalities of gait and mobility: Secondary | ICD-10-CM | POA: Diagnosis not present

## 2015-09-10 DIAGNOSIS — M542 Cervicalgia: Secondary | ICD-10-CM | POA: Diagnosis not present

## 2015-09-10 DIAGNOSIS — Z9181 History of falling: Secondary | ICD-10-CM | POA: Diagnosis not present

## 2015-09-10 DIAGNOSIS — R269 Unspecified abnormalities of gait and mobility: Secondary | ICD-10-CM | POA: Diagnosis not present

## 2015-09-10 DIAGNOSIS — G8929 Other chronic pain: Secondary | ICD-10-CM | POA: Diagnosis not present

## 2015-09-13 DIAGNOSIS — Z9181 History of falling: Secondary | ICD-10-CM | POA: Diagnosis not present

## 2015-09-13 DIAGNOSIS — M542 Cervicalgia: Secondary | ICD-10-CM | POA: Diagnosis not present

## 2015-09-13 DIAGNOSIS — R269 Unspecified abnormalities of gait and mobility: Secondary | ICD-10-CM | POA: Diagnosis not present

## 2015-09-13 DIAGNOSIS — G8929 Other chronic pain: Secondary | ICD-10-CM | POA: Diagnosis not present

## 2015-09-14 DIAGNOSIS — M50821 Other cervical disc disorders at C4-C5 level: Secondary | ICD-10-CM | POA: Diagnosis not present

## 2015-09-14 DIAGNOSIS — M542 Cervicalgia: Secondary | ICD-10-CM | POA: Diagnosis not present

## 2015-09-14 DIAGNOSIS — M50823 Other cervical disc disorders at C6-C7 level: Secondary | ICD-10-CM | POA: Diagnosis not present

## 2015-09-14 DIAGNOSIS — M50822 Other cervical disc disorders at C5-C6 level: Secondary | ICD-10-CM | POA: Diagnosis not present

## 2015-09-17 ENCOUNTER — Other Ambulatory Visit (HOSPITAL_COMMUNITY): Payer: Self-pay | Admitting: Adult Health

## 2015-09-17 DIAGNOSIS — Z9181 History of falling: Secondary | ICD-10-CM | POA: Diagnosis not present

## 2015-09-17 DIAGNOSIS — R269 Unspecified abnormalities of gait and mobility: Secondary | ICD-10-CM | POA: Diagnosis not present

## 2015-09-17 DIAGNOSIS — G8929 Other chronic pain: Secondary | ICD-10-CM | POA: Diagnosis not present

## 2015-09-17 DIAGNOSIS — M542 Cervicalgia: Secondary | ICD-10-CM | POA: Diagnosis not present

## 2015-09-20 DIAGNOSIS — R269 Unspecified abnormalities of gait and mobility: Secondary | ICD-10-CM | POA: Diagnosis not present

## 2015-09-20 DIAGNOSIS — G8929 Other chronic pain: Secondary | ICD-10-CM | POA: Diagnosis not present

## 2015-09-20 DIAGNOSIS — M542 Cervicalgia: Secondary | ICD-10-CM | POA: Diagnosis not present

## 2015-09-20 DIAGNOSIS — Z9181 History of falling: Secondary | ICD-10-CM | POA: Diagnosis not present

## 2015-09-24 DIAGNOSIS — R269 Unspecified abnormalities of gait and mobility: Secondary | ICD-10-CM | POA: Diagnosis not present

## 2015-09-24 DIAGNOSIS — G8929 Other chronic pain: Secondary | ICD-10-CM | POA: Diagnosis not present

## 2015-09-24 DIAGNOSIS — Z9181 History of falling: Secondary | ICD-10-CM | POA: Diagnosis not present

## 2015-09-24 DIAGNOSIS — M542 Cervicalgia: Secondary | ICD-10-CM | POA: Diagnosis not present

## 2015-09-27 DIAGNOSIS — Z9181 History of falling: Secondary | ICD-10-CM | POA: Diagnosis not present

## 2015-09-27 DIAGNOSIS — M542 Cervicalgia: Secondary | ICD-10-CM | POA: Diagnosis not present

## 2015-09-27 DIAGNOSIS — R269 Unspecified abnormalities of gait and mobility: Secondary | ICD-10-CM | POA: Diagnosis not present

## 2015-09-27 DIAGNOSIS — G8929 Other chronic pain: Secondary | ICD-10-CM | POA: Diagnosis not present

## 2015-10-01 DIAGNOSIS — G8929 Other chronic pain: Secondary | ICD-10-CM | POA: Diagnosis not present

## 2015-10-01 DIAGNOSIS — R269 Unspecified abnormalities of gait and mobility: Secondary | ICD-10-CM | POA: Diagnosis not present

## 2015-10-01 DIAGNOSIS — Z9181 History of falling: Secondary | ICD-10-CM | POA: Diagnosis not present

## 2015-10-01 DIAGNOSIS — M542 Cervicalgia: Secondary | ICD-10-CM | POA: Diagnosis not present

## 2015-10-04 DIAGNOSIS — Z9181 History of falling: Secondary | ICD-10-CM | POA: Diagnosis not present

## 2015-10-04 DIAGNOSIS — R269 Unspecified abnormalities of gait and mobility: Secondary | ICD-10-CM | POA: Diagnosis not present

## 2015-10-04 DIAGNOSIS — M542 Cervicalgia: Secondary | ICD-10-CM | POA: Diagnosis not present

## 2015-10-04 DIAGNOSIS — G8929 Other chronic pain: Secondary | ICD-10-CM | POA: Diagnosis not present

## 2015-11-23 ENCOUNTER — Other Ambulatory Visit: Payer: Self-pay | Admitting: Hematology and Oncology

## 2015-11-25 ENCOUNTER — Other Ambulatory Visit: Payer: Self-pay | Admitting: *Deleted

## 2015-11-25 DIAGNOSIS — C50412 Malignant neoplasm of upper-outer quadrant of left female breast: Secondary | ICD-10-CM

## 2015-11-25 MED ORDER — GABAPENTIN 100 MG PO CAPS: 300.0000 mg | ORAL_CAPSULE | Freq: Every day | ORAL | Status: DC

## 2015-11-25 MED ORDER — LETROZOLE 2.5 MG PO TABS: 2.5000 mg | ORAL_TABLET | Freq: Every day | ORAL | Status: DC

## 2015-11-26 DIAGNOSIS — I10 Essential (primary) hypertension: Secondary | ICD-10-CM | POA: Diagnosis not present

## 2015-11-26 DIAGNOSIS — R5383 Other fatigue: Secondary | ICD-10-CM | POA: Diagnosis not present

## 2015-11-26 DIAGNOSIS — K219 Gastro-esophageal reflux disease without esophagitis: Secondary | ICD-10-CM | POA: Diagnosis not present

## 2015-11-26 DIAGNOSIS — E559 Vitamin D deficiency, unspecified: Secondary | ICD-10-CM | POA: Diagnosis not present

## 2015-11-26 DIAGNOSIS — E7800 Pure hypercholesterolemia, unspecified: Secondary | ICD-10-CM | POA: Diagnosis not present

## 2015-11-26 DIAGNOSIS — M8580 Other specified disorders of bone density and structure, unspecified site: Secondary | ICD-10-CM | POA: Diagnosis not present

## 2015-11-26 DIAGNOSIS — R69 Illness, unspecified: Secondary | ICD-10-CM | POA: Diagnosis not present

## 2015-12-05 DIAGNOSIS — R790 Abnormal level of blood mineral: Secondary | ICD-10-CM | POA: Diagnosis not present

## 2015-12-05 DIAGNOSIS — R7989 Other specified abnormal findings of blood chemistry: Secondary | ICD-10-CM | POA: Diagnosis not present

## 2015-12-05 DIAGNOSIS — E538 Deficiency of other specified B group vitamins: Secondary | ICD-10-CM | POA: Diagnosis not present

## 2016-01-03 ENCOUNTER — Telehealth: Payer: Self-pay | Admitting: Hematology and Oncology

## 2016-01-03 NOTE — Telephone Encounter (Signed)
S/w pt, advised 7/3 appt moved to 7/7 due to md pal. Pt says she cannot come in on 7/7. Gave appt for 7/10 @ 2.15p.

## 2016-01-06 ENCOUNTER — Other Ambulatory Visit: Payer: Medicare HMO

## 2016-01-06 ENCOUNTER — Ambulatory Visit: Payer: Medicare HMO | Admitting: Hematology and Oncology

## 2016-01-06 ENCOUNTER — Ambulatory Visit: Payer: Medicare HMO

## 2016-01-08 ENCOUNTER — Encounter: Payer: Self-pay | Admitting: Hematology and Oncology

## 2016-01-08 DIAGNOSIS — Z853 Personal history of malignant neoplasm of breast: Secondary | ICD-10-CM | POA: Diagnosis not present

## 2016-01-08 DIAGNOSIS — Z1231 Encounter for screening mammogram for malignant neoplasm of breast: Secondary | ICD-10-CM | POA: Diagnosis not present

## 2016-01-08 DIAGNOSIS — M8589 Other specified disorders of bone density and structure, multiple sites: Secondary | ICD-10-CM | POA: Diagnosis not present

## 2016-01-10 ENCOUNTER — Other Ambulatory Visit: Payer: Medicare HMO

## 2016-01-10 ENCOUNTER — Ambulatory Visit: Payer: Medicare HMO

## 2016-01-10 ENCOUNTER — Ambulatory Visit: Payer: Medicare HMO | Admitting: Hematology and Oncology

## 2016-01-10 ENCOUNTER — Other Ambulatory Visit: Payer: Self-pay

## 2016-01-10 DIAGNOSIS — C50412 Malignant neoplasm of upper-outer quadrant of left female breast: Secondary | ICD-10-CM

## 2016-01-13 ENCOUNTER — Encounter: Payer: Self-pay | Admitting: Hematology and Oncology

## 2016-01-13 ENCOUNTER — Ambulatory Visit (HOSPITAL_BASED_OUTPATIENT_CLINIC_OR_DEPARTMENT_OTHER): Payer: Medicare HMO

## 2016-01-13 ENCOUNTER — Other Ambulatory Visit (HOSPITAL_BASED_OUTPATIENT_CLINIC_OR_DEPARTMENT_OTHER): Payer: Medicare HMO

## 2016-01-13 ENCOUNTER — Telehealth: Payer: Self-pay | Admitting: Hematology and Oncology

## 2016-01-13 ENCOUNTER — Ambulatory Visit (HOSPITAL_BASED_OUTPATIENT_CLINIC_OR_DEPARTMENT_OTHER): Payer: Medicare HMO | Admitting: Hematology and Oncology

## 2016-01-13 VITALS — BP 139/49 | HR 78 | Temp 98.3°F | Resp 17 | Ht 61.0 in | Wt 174.4 lb

## 2016-01-13 DIAGNOSIS — M8580 Other specified disorders of bone density and structure, unspecified site: Secondary | ICD-10-CM

## 2016-01-13 DIAGNOSIS — C50412 Malignant neoplasm of upper-outer quadrant of left female breast: Secondary | ICD-10-CM

## 2016-01-13 DIAGNOSIS — Z170 Estrogen receptor positive status [ER+]: Secondary | ICD-10-CM

## 2016-01-13 DIAGNOSIS — C779 Secondary and unspecified malignant neoplasm of lymph node, unspecified: Secondary | ICD-10-CM

## 2016-01-13 LAB — COMPREHENSIVE METABOLIC PANEL
ALBUMIN: 3.5 g/dL (ref 3.5–5.0)
ALK PHOS: 75 U/L (ref 40–150)
ALT: 11 U/L (ref 0–55)
ANION GAP: 9 meq/L (ref 3–11)
AST: 15 U/L (ref 5–34)
BUN: 21.6 mg/dL (ref 7.0–26.0)
CO2: 22 meq/L (ref 22–29)
CREATININE: 1 mg/dL (ref 0.6–1.1)
Calcium: 9.6 mg/dL (ref 8.4–10.4)
Chloride: 109 mEq/L (ref 98–109)
EGFR: 56 mL/min/{1.73_m2} — ABNORMAL LOW (ref >90)
GLUCOSE: 135 mg/dL (ref 70–140)
Potassium: 4.3 mEq/L (ref 3.5–5.1)
Sodium: 141 mEq/L (ref 136–145)
TOTAL PROTEIN: 6.9 g/dL (ref 6.4–8.3)

## 2016-01-13 LAB — CBC WITH DIFFERENTIAL/PLATELET
BASO%: 0.4 % (ref 0.0–2.0)
BASOS ABS: 0 10*3/uL (ref 0.0–0.1)
EOS ABS: 0.6 10*3/uL — AB (ref 0.0–0.5)
EOS%: 5.5 % (ref 0.0–7.0)
HCT: 30.6 % — ABNORMAL LOW (ref 34.8–46.6)
HEMOGLOBIN: 10.3 g/dL — AB (ref 11.6–15.9)
LYMPH#: 2.5 10*3/uL (ref 0.9–3.3)
LYMPH%: 24.5 % (ref 14.0–49.7)
MCH: 29.7 pg (ref 25.1–34.0)
MCHC: 33.7 g/dL (ref 31.5–36.0)
MCV: 88.2 fL (ref 79.5–101.0)
MONO#: 0.7 10*3/uL (ref 0.1–0.9)
MONO%: 6.9 % (ref 0.0–14.0)
NEUT%: 62.7 % (ref 38.4–76.8)
NEUTROS ABS: 6.5 10*3/uL (ref 1.5–6.5)
PLATELETS: 255 10*3/uL (ref 145–400)
RBC: 3.47 10*6/uL — AB (ref 3.70–5.45)
RDW: 14.5 % (ref 11.2–14.5)
WBC: 10.3 10*3/uL (ref 3.9–10.3)

## 2016-01-13 MED ORDER — DENOSUMAB 60 MG/ML ~~LOC~~ SOLN
60.0000 mg | Freq: Once | SUBCUTANEOUS | Status: AC
  Administered 2016-01-13: 60 mg via SUBCUTANEOUS
  Filled 2016-01-13: qty 1

## 2016-01-13 NOTE — Progress Notes (Signed)
Patient Care Team: Kathyrn Lass, MD as PCP - General  SUMMARY OF ONCOLOGIC HISTORY:   Primary cancer of upper outer quadrant of left female breast ([place])   12/18/2011 Surgery Left mastectomy: 2 foci of well-differentiated invasive ductal carcinoma 1.7 cm, 0.7 cm with DCIS, lymphovascular invasion present, one positive SLN with ECS, ER/PR positive, HER-2 negative, Ki-67 13%; 0.7 cm tumor: ER 100, PR 7% Her 2Pos 4.94, Ki 50%   02/09/2012 - 02/06/2013 Chemotherapy Adjuvant chemotherapy Taxol Herceptin X 10 discontinued due to intolerance followed by Herceptin maintenance   04/26/2012 - 06/10/2012 Radiation Therapy Adjuvant radiation therapy   06/10/2012 -  Anti-estrogen oral therapy Letrozole 2.5 mg daily    CHIEF COMPLIANT: Follow-up on letrozole therapy  INTERVAL HISTORY: Brandy [name] is a 82 year old with above-mentioned history of left breast cancer treated with mastectomy followed by adjuvant chemotherapy and radiation and is currently on letrozole. She appears to be tolerating letrozole fairly well. However, recently she was diagnosed with severe osteoarthritis involving her arms and knees as well as the neck. She takes over-the-counter pain medication for the pain. She had CT scans done which revealed diffuse osteoarthritis.  REVIEW OF SYSTEMS:   Constitutional: Denies fevers, chills or abnormal weight loss Eyes: Denies blurriness of vision Ears, nose, mouth, throat, and face: Denies mucositis or sore throat Respiratory: Denies cough, dyspnea or wheezes Cardiovascular: Denies palpitation, chest discomfort Gastrointestinal:  Denies nausea, heartburn or change in bowel habits Skin: Denies abnormal skin rashes Lymphatics: Denies new lymphadenopathy or easy bruising Neurological:Denies numbness, tingling or new weaknesses, diffuse arthritic symptoms Behavioral/Psych: Mood is stable, no new changes  Extremities: No lower extremity edema Breast:  denies any pain or lumps or nodules All other  systems were reviewed with the patient and are negative.  I have reviewed the past medical history, past surgical history, social history and family history with the patient and they are unchanged from previous note.  ALLERGIES:  has No Known Allergies.  MEDICATIONS:  Current Outpatient Prescriptions  Medication Sig Dispense Refill  . amLODipine (NORVASC) 10 MG tablet Take 10 mg by mouth every morning.     . cephALEXin (KEFLEX) 500 MG capsule Take 1 capsule (500 mg total) by mouth 4 (four) times daily. 28 capsule 0  . gabapentin (NEURONTIN) 100 MG capsule Take 3 capsules (300 mg total) by mouth at bedtime. 90 capsule 6  . gabapentin (NEURONTIN) 300 MG capsule Take 1 capsule (300 mg total) by mouth at bedtime. (Patient not taking: Reported on 08/21/2015) 90 capsule 3  . HYDROcodone-acetaminophen (NORCO) 5-325 MG tablet Take 1-2 tablets by mouth every 6 (six) hours as needed. 15 tablet 0  . letrozole (FEMARA) 2.5 MG tablet Take 1 tablet (2.5 mg total) by mouth daily. 90 tablet 3  . omeprazole (PRILOSEC) 20 MG capsule Take 20 mg by mouth daily.    Marland Kitchen spironolactone (ALDACTONE) 25 MG tablet TAKE 1/2 TABLET BY MOUTH EVERY DAY (Patient taking differently: TAKE 1/2 TABLET-1 TABLET BY MOUTH EVERY DAY) 30 tablet 4  . spironolactone (ALDACTONE) 25 MG tablet TAKE 1/2 TABLET BY MOUTH EVERY DAY 30 tablet 4  . valsartan-hydrochlorothiazide (DIOVAN-HCT) 160-12.5 MG per tablet Take 1 tablet by mouth every morning.     . zolpidem (AMBIEN) 10 MG tablet Take 10 mg by mouth at bedtime.     No current facility-administered medications for this visit.   Facility-Administered Medications Ordered in Other Visits  Medication Dose Route Frequency Provider Last Rate Last Dose  . sodium chloride 0.9 %  injection 10 mL  10 mL Intracatheter PRN Consuela Mimes, MD   10 mL at 03/14/12 1628    PHYSICAL EXAMINATION: ECOG PERFORMANCE STATUS: 1 - Symptomatic but completely ambulatory  Filed Vitals:   01/13/16 1437  BP:  139/49  Pulse: 78  Temp: 98.3 F (36.8 C)  Resp: 17   Filed Weights   01/13/16 1437  Weight: 174 lb 6.4 oz (79.107 kg)    GENERAL:alert, no distress and comfortable SKIN: skin color, texture, turgor are normal, no rashes or significant lesions EYES: normal, Conjunctiva are pink and non-injected, sclera clear OROPHARYNX:no exudate, no erythema and lips, buccal mucosa, and tongue normal  NECK: supple, thyroid normal size, non-tender, without nodularity LYMPH:  no palpable lymphadenopathy in the cervical, axillary or inguinal LUNGS: clear to auscultation and percussion with normal breathing effort HEART: regular rate & rhythm and no murmurs and no lower extremity edema ABDOMEN:abdomen soft, non-tender and normal bowel sounds MUSCULOSKELETAL:no cyanosis of digits and no clubbing  NEURO: alert & oriented x 3 with fluent speech, no focal motor/sensory deficits EXTREMITIES: No lower extremity edema BREAST: No palpable masses or nodules in right breast. Left mastectomy No palpable axillary supraclavicular or infraclavicular adenopathy no breast tenderness or nipple discharge. (exam performed in the presence of a chaperone)  LABORATORY DATA:  I have reviewed the data as listed   Chemistry      Component Value Date/Time   NA 141 01/13/2016 1354   NA 142 08/21/2015 0845   K 4.3 01/13/2016 1354   K 4.1 08/21/2015 0845   CL 109 08/21/2015 0845   CL 108* 12/28/2012 1343   CO2 22 01/13/2016 1354   CO2 21* 08/21/2015 0845   BUN 21.6 01/13/2016 1354   BUN 17 08/21/2015 0845   CREATININE 1.0 01/13/2016 1354   CREATININE 0.76 08/21/2015 0845      Component Value Date/Time   CALCIUM 9.6 01/13/2016 1354   CALCIUM 8.3* 08/21/2015 0845   ALKPHOS 75 01/13/2016 1354   ALKPHOS 74 03/02/2014 1429   AST 15 01/13/2016 1354   AST 23 03/02/2014 1429   ALT 11 01/13/2016 1354   ALT 17 03/02/2014 1429   BILITOT <0.30 01/13/2016 1354   BILITOT 0.6 03/02/2014 1429       Lab Results  Component  Value Date   WBC 10.3 01/13/2016   HGB 10.3* 01/13/2016   HCT 30.6* 01/13/2016   MCV 88.2 01/13/2016   PLT 255 01/13/2016   NEUTROABS 6.5 01/13/2016   ASSESSMENT & PLAN:  Primary cancer of upper outer quadrant of left female breast Left breast cancer multifocal disease 1.7 cm and 0.7 cm, one sentinel lymph node positive with focal extracapsular extension: 1.7 cm mass was ER/PR positive and HER-2 negative with a Ki-67 of 30%: The 0.7 cm mass was ER 100%, PR 7%, Ki-67 15%, HER-2 positive ratio 4.94, status post chemotherapy with Taxol Herceptin from 02/09/2012 to 04/04/2012 discontinued early due to Taxol side effects followed by Herceptin maintenance followed by radiation therapy and adjuvant letrozole was started December 2013  Letrozole toxicities: 1. Osteopenia T score -1.12 January 2016 (Improved from -2.4) continue with Prolia with calcium and vitamin D  she will receive Prolia injections at least for the next one half years.   Breast Cancer Surveillance: 1. Breast exam 01/13/2016: Left mastectomy scar is intact without any palpable nodularity. Right breast without any lumps or nodules. 2. Mammogram 01/08/2014 No abnormalities. Postsurgical changes. Breast Density Category A. I recommended that she get 3-D mammograms for surveillance.  Discussed the differences between different breast density categories.  Return to clinic in 1 year for follow-up   No orders of the defined types were placed in this encounter.   The patient has a good understanding of the overall plan. she agrees with it. she will call with any problems that may develop before the next visit here.   Rulon Eisenmenger, MD 01/13/2016

## 2016-01-13 NOTE — Assessment & Plan Note (Signed)
Left breast cancer multifocal disease 1.7 cm and 0.7 cm, one sentinel lymph node positive with focal extracapsular extension: 1.7 cm mass was ER/PR positive and HER-2 negative with a Ki-67 of 30%: The 0.7 cm mass was ER 100%, PR 7%, Ki-67 15%, HER-2 positive ratio 4.94, status post chemotherapy with Taxol Herceptin from 02/09/2012 to 04/04/2012 discontinued early due to Taxol side effects followed by Herceptin maintenance followed by radiation therapy and adjuvant letrozole was started December 2013  Letrozole toxicities: 1. Osteopenia T score -2.4 continue with Prolia with calcium and vitamin D  Breast Cancer Surveillance: 1. Breast exam 01/13/2016: Normal 2. Mammogram 01/08/2014 No abnormalities. Postsurgical changes. Breast Density Category A. I recommended that she get 3-D mammograms for surveillance. Discussed the differences between different breast density categories.  Return to clinic in 1 year for follow-up

## 2016-01-13 NOTE — Patient Instructions (Signed)
Denosumab injection What is this medicine? DENOSUMAB (den oh sue mab) slows bone breakdown. Prolia is used to treat osteoporosis in women after menopause and in men. Xgeva is used to prevent bone fractures and other bone problems caused by cancer bone metastases. Xgeva is also used to treat giant cell tumor of the bone. This medicine may be used for other purposes; ask your health care provider or pharmacist if you have questions. COMMON BRAND NAME(S): Prolia, XGEVA What should I tell my health care provider before I take this medicine? They need to know if you have any of these conditions: -dental disease -eczema -infection or history of infections -kidney disease or on dialysis -low blood calcium or vitamin D -malabsorption syndrome -scheduled to have surgery or tooth extraction -taking medicine that contains denosumab -thyroid or parathyroid disease -an unusual reaction to denosumab, other medicines, foods, dyes, or preservatives -pregnant or trying to get pregnant -breast-feeding How should I use this medicine? This medicine is for injection under the skin. It is given by a health care professional in a hospital or clinic setting. If you are getting Prolia, a special MedGuide will be given to you by the pharmacist with each prescription and refill. Be sure to read this information carefully each time. For Prolia, talk to your pediatrician regarding the use of this medicine in children. Special care may be needed. For Xgeva, talk to your pediatrician regarding the use of this medicine in children. While this drug may be prescribed for children as young as 13 years for selected conditions, precautions do apply. Overdosage: If you think you've taken too much of this medicine contact a poison control center or emergency room at once. Overdosage: If you think you have taken too much of this medicine contact a poison control center or emergency room at once. NOTE: This medicine is only for  you. Do not share this medicine with others. What if I miss a dose? It is important not to miss your dose. Call your doctor or health care professional if you are unable to keep an appointment. What may interact with this medicine? Do not take this medicine with any of the following medications: -other medicines containing denosumab This medicine may also interact with the following medications: -medicines that suppress the immune system -medicines that treat cancer -steroid medicines like prednisone or cortisone This list may not describe all possible interactions. Give your health care provider a list of all the medicines, herbs, non-prescription drugs, or dietary supplements you use. Also tell them if you smoke, drink alcohol, or use illegal drugs. Some items may interact with your medicine. What should I watch for while using this medicine? Visit your doctor or health care professional for regular checks on your progress. Your doctor or health care professional may order blood tests and other tests to see how you are doing. Call your doctor or health care professional if you get a cold or other infection while receiving this medicine. Do not treat yourself. This medicine may decrease your body's ability to fight infection. You should make sure you get enough calcium and vitamin D while you are taking this medicine, unless your doctor tells you not to. Discuss the foods you eat and the vitamins you take with your health care professional. See your dentist regularly. Brush and floss your teeth as directed. Before you have any dental work done, tell your dentist you are receiving this medicine. Do not become pregnant while taking this medicine or for 5 months after stopping   it. Women should inform their doctor if they wish to become pregnant or think they might be pregnant. There is a potential for serious side effects to an unborn child. Talk to your health care professional or pharmacist for more  information. What side effects may I notice from receiving this medicine? Side effects that you should report to your doctor or health care professional as soon as possible: -allergic reactions like skin rash, itching or hives, swelling of the face, lips, or tongue -breathing problems -chest pain -fast, irregular heartbeat -feeling faint or lightheaded, falls -fever, chills, or any other sign of infection -muscle spasms, tightening, or twitches -numbness or tingling -skin blisters or bumps, or is dry, peels, or red -slow healing or unexplained pain in the mouth or jaw -unusual bleeding or bruising Side effects that usually do not require medical attention (Report these to your doctor or health care professional if they continue or are bothersome.): -muscle pain -stomach upset, gas This list may not describe all possible side effects. Call your doctor for medical advice about side effects. You may report side effects to FDA at 1-800-FDA-1088. Where should I keep my medicine? This medicine is only given in a clinic, doctor's office, or other health care setting and will not be stored at home. NOTE: This sheet is a summary. It may not cover all possible information. If you have questions about this medicine, talk to your doctor, pharmacist, or health care provider.  2015, Elsevier/Gold Standard. (2011-12-21 12:37:47)  

## 2016-01-13 NOTE — Telephone Encounter (Signed)
appt made and avs printed °

## 2016-01-17 DIAGNOSIS — N201 Calculus of ureter: Secondary | ICD-10-CM | POA: Diagnosis not present

## 2016-01-17 DIAGNOSIS — R1031 Right lower quadrant pain: Secondary | ICD-10-CM | POA: Diagnosis not present

## 2016-01-17 DIAGNOSIS — R300 Dysuria: Secondary | ICD-10-CM | POA: Diagnosis not present

## 2016-03-19 DIAGNOSIS — K432 Incisional hernia without obstruction or gangrene: Secondary | ICD-10-CM | POA: Diagnosis not present

## 2016-03-19 DIAGNOSIS — C50912 Malignant neoplasm of unspecified site of left female breast: Secondary | ICD-10-CM | POA: Diagnosis not present

## 2016-05-12 DIAGNOSIS — I10 Essential (primary) hypertension: Secondary | ICD-10-CM | POA: Diagnosis not present

## 2016-05-12 DIAGNOSIS — Z0000 Encounter for general adult medical examination without abnormal findings: Secondary | ICD-10-CM | POA: Diagnosis not present

## 2016-05-12 DIAGNOSIS — C50919 Malignant neoplasm of unspecified site of unspecified female breast: Secondary | ICD-10-CM | POA: Diagnosis not present

## 2016-05-12 DIAGNOSIS — Z6833 Body mass index (BMI) 33.0-33.9, adult: Secondary | ICD-10-CM | POA: Diagnosis not present

## 2016-05-12 DIAGNOSIS — E785 Hyperlipidemia, unspecified: Secondary | ICD-10-CM | POA: Diagnosis not present

## 2016-05-12 DIAGNOSIS — R69 Illness, unspecified: Secondary | ICD-10-CM | POA: Diagnosis not present

## 2016-05-12 DIAGNOSIS — K219 Gastro-esophageal reflux disease without esophagitis: Secondary | ICD-10-CM | POA: Diagnosis not present

## 2016-06-01 DIAGNOSIS — E538 Deficiency of other specified B group vitamins: Secondary | ICD-10-CM | POA: Diagnosis not present

## 2016-06-01 DIAGNOSIS — Z23 Encounter for immunization: Secondary | ICD-10-CM | POA: Diagnosis not present

## 2016-06-01 DIAGNOSIS — R5383 Other fatigue: Secondary | ICD-10-CM | POA: Diagnosis not present

## 2016-06-01 DIAGNOSIS — I10 Essential (primary) hypertension: Secondary | ICD-10-CM | POA: Diagnosis not present

## 2016-06-01 DIAGNOSIS — R0609 Other forms of dyspnea: Secondary | ICD-10-CM | POA: Diagnosis not present

## 2016-06-01 DIAGNOSIS — R69 Illness, unspecified: Secondary | ICD-10-CM | POA: Diagnosis not present

## 2016-06-01 DIAGNOSIS — C50912 Malignant neoplasm of unspecified site of left female breast: Secondary | ICD-10-CM | POA: Diagnosis not present

## 2016-06-01 DIAGNOSIS — R195 Other fecal abnormalities: Secondary | ICD-10-CM | POA: Diagnosis not present

## 2016-06-01 DIAGNOSIS — K219 Gastro-esophageal reflux disease without esophagitis: Secondary | ICD-10-CM | POA: Diagnosis not present

## 2016-06-01 DIAGNOSIS — E559 Vitamin D deficiency, unspecified: Secondary | ICD-10-CM | POA: Diagnosis not present

## 2016-06-11 ENCOUNTER — Telehealth: Payer: Self-pay | Admitting: *Deleted

## 2016-06-11 NOTE — Telephone Encounter (Signed)
NOTES SENT TO SCHEDULING ON 06/11/16.

## 2016-06-15 ENCOUNTER — Ambulatory Visit (HOSPITAL_COMMUNITY): Payer: Medicare HMO | Attending: Cardiology

## 2016-06-15 ENCOUNTER — Other Ambulatory Visit: Payer: Self-pay

## 2016-06-15 ENCOUNTER — Other Ambulatory Visit: Payer: Self-pay | Admitting: Family Medicine

## 2016-06-15 DIAGNOSIS — I350 Nonrheumatic aortic (valve) stenosis: Secondary | ICD-10-CM

## 2016-06-15 DIAGNOSIS — I082 Rheumatic disorders of both aortic and tricuspid valves: Secondary | ICD-10-CM | POA: Insufficient documentation

## 2016-06-17 ENCOUNTER — Ambulatory Visit (INDEPENDENT_AMBULATORY_CARE_PROVIDER_SITE_OTHER): Payer: Medicare HMO | Admitting: Nurse Practitioner

## 2016-06-17 ENCOUNTER — Encounter: Payer: Self-pay | Admitting: Nurse Practitioner

## 2016-06-17 ENCOUNTER — Other Ambulatory Visit (INDEPENDENT_AMBULATORY_CARE_PROVIDER_SITE_OTHER): Payer: Medicare HMO

## 2016-06-17 VITALS — BP 128/48 | HR 76 | Ht 58.75 in | Wt 177.4 lb

## 2016-06-17 DIAGNOSIS — D649 Anemia, unspecified: Secondary | ICD-10-CM | POA: Diagnosis not present

## 2016-06-17 LAB — BASIC METABOLIC PANEL
BUN: 21 mg/dL (ref 6–23)
CO2: 26 mEq/L (ref 19–32)
CREATININE: 0.88 mg/dL (ref 0.40–1.20)
Calcium: 9.6 mg/dL (ref 8.4–10.5)
Chloride: 105 mEq/L (ref 96–112)
GFR: 65.51 mL/min (ref >60.00)
Glucose, Bld: 114 mg/dL — ABNORMAL HIGH (ref 70–99)
POTASSIUM: 4.4 meq/L (ref 3.5–5.1)
Sodium: 139 mEq/L (ref 135–145)

## 2016-06-17 LAB — CBC WITH DIFFERENTIAL/PLATELET
BASOS ABS: 0 10*3/uL (ref 0.0–0.1)
Basophils Relative: 0.3 % (ref 0.0–3.0)
EOS PCT: 4.4 % (ref 0.0–5.0)
Eosinophils Absolute: 0.5 10*3/uL (ref 0.0–0.7)
HCT: 25.6 % — ABNORMAL LOW (ref 36.0–46.0)
Hemoglobin: 8.2 g/dL — ABNORMAL LOW (ref 12.0–15.0)
Lymphocytes Relative: 23.5 % (ref 12.0–46.0)
Lymphs Abs: 2.8 10*3/uL (ref 0.7–4.0)
MCHC: 32.1 g/dL (ref 30.0–36.0)
MCV: 81.8 fl (ref 78.0–100.0)
MONO ABS: 0.7 10*3/uL (ref 0.1–1.0)
MONOS PCT: 6.1 % (ref 3.0–12.0)
NEUTROS PCT: 65.7 % (ref 43.0–77.0)
Neutro Abs: 7.9 10*3/uL — ABNORMAL HIGH (ref 1.4–7.7)
PLATELETS: 318 10*3/uL (ref 150.0–400.0)
RBC: 3.13 Mil/uL — ABNORMAL LOW (ref 3.87–5.11)
RDW: 16.6 % — ABNORMAL HIGH (ref 11.5–15.5)
WBC: 12 10*3/uL — AB (ref 4.0–10.5)

## 2016-06-17 NOTE — Progress Notes (Signed)
     HPI: Patient is an 82 year old female known to Dr. Ardis Hughs. 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 She is now referred by PCP, Dr. Kathyrn Lass for evaluation of anemia and heme positive stools. In late May hemoglobin was 10.8. Since then it has declined to 8.6, MCV 84, her ferritin is 31 and fecal occult blood is positive. Patient had been taking Aleve for neck pain. No abdominal pain. No nausea. No bowel changes or unexpected weight loss. She does have a history of GERD, was on omeprazole but out of medication for the last 3 weeks. She's been taking some of her granddaughter's Zantac which does not work as well as PPI.   Past Medical History:  Diagnosis Date  . Arthritis    shoulders  . Asthma    mild, no inhalers used  . Breast CA ([place]) 11/23/11   left breast 3 o'clock bx=high grade ductal ca in situ w/comedononecrosis and calcification,dcis,lymphovascular invasion present ,ER?PR+positive  . Breast cancer ([place]) 12/18/11   left breast masectomy=metastatic ca in (1/1) lymph node ,invasive ductal ca,2 foci,,dcis,lymph ovascular invasion identified,surgical resection margins neg for ca,additional tissue=benign skin and subcutaneous tissue  . Bronchitis    hx of;last time >45yr ago  . Cancer ([place])    left breast  . Depression    takes Celexa daily  . Difficult intubation   . Full dentures   . Gall stones 2015  . GERD (gastroesophageal reflux disease)    takes Prilosec prn  . H/O hiatal hernia   . History of kidney stones    many yrs ago  . Hx of radiation therapy 04/26/12 -06/10/12   left breast  . Hyperlipidemia    takes Simvastatin daily  . Hypertension    takes Amlodipine and Diovan daily  . Insomnia    takes Ambien nightly  . Urinary urgency     Patient's surgical history, family medical history, social history, medications and allergies were all reviewed in Epic    Physical Exam: BP (!)  128/48   Pulse 76   Ht 4' 10.75" (1.492 m) Comment: measured without shoes  Wt 177 lb 6 oz (80.5 kg)   BMI 36.13 kg/m   GENERAL: Obese white female in NAD PSYCH: :Pleasant, cooperative, normal affect HEENT: Normocephalic, conjunctiva pink, mucous membranes moist, neck supple without masses CARDIAC:  RRR,  no murmur is present, 2 plus bilateral lower extremity edema PULM: Normal respiratory effort, lungs CTA bilaterally, no wheezing ABDOMEN:  soft, nontender, nondistended, no obvious masses, no hepatomegaly,  normal bowel sounds SKIN:  turgor, no lesions seen NEURO: Alert and oriented x 3, no focal neurologic deficits   ASSESSMENT and PLAN:  82 yo year old with normocytic anemia, Hemoccult positive stools. Ferritin 31. Stools dark on iron but heme positive at PCPs office. Patient recently taking Aleve for neck pain, PCP has discontinued this. -we discussed DDx and options for further evaluation not excluding a watch and watch approach off NSAIDS. She is agreeable to EGD. If EGD negative I don't think patient is good candidate for colonoscopy given deconditioned state / history of falls.  -The risks and benefits of EGD were discussed and the patient agrees to proceed.  -continue PPI -Agree with stopping NSAIDS     Tye Savoy , NP 06/17/2016, 2:41 PM   Cc: Kathyrn Lass, MD

## 2016-06-17 NOTE — Patient Instructions (Addendum)
You have been scheduled for an endoscopy. Please follow written instructions given to you at your visit today. If you use inhalers (even only as needed), please bring them with you on the day of your procedure. Your physician has requested that you go to www.startemmi.com and enter the access code given to you at your visit today. This web site gives a general overview about your procedure. However, you should still follow specific instructions given to you by our office regarding your preparation for the procedure.  Go to the basement for labs today  Continue Omeprazole

## 2016-06-22 ENCOUNTER — Telehealth: Payer: Self-pay

## 2016-06-22 DIAGNOSIS — D500 Iron deficiency anemia secondary to blood loss (chronic): Secondary | ICD-10-CM | POA: Diagnosis not present

## 2016-06-22 DIAGNOSIS — R0609 Other forms of dyspnea: Secondary | ICD-10-CM | POA: Diagnosis not present

## 2016-06-22 DIAGNOSIS — I350 Nonrheumatic aortic (valve) stenosis: Secondary | ICD-10-CM | POA: Diagnosis not present

## 2016-06-22 NOTE — Telephone Encounter (Signed)
Received call from Dr. Irma Newness office regarding pt. States pt was seen in office today and she did not look good, she has EGD scheduled with Dr. Ardis Hughs 07/08/16. Pt was seen last week by Tye Savoy NP. Dr. Sabra Heck wanted to let Dr. Ardis Hughs know that pts Hgb was 8.6 on 11/27 and today it was 7.7. Wanted to let Dr. Ardis Hughs know in case he wanted to move EGD up sooner of if he wanted pt to have IV iron. Pt does seen heme/onc for history of breast cancer. Dr. Ardis Hughs notified.

## 2016-06-22 NOTE — Progress Notes (Signed)
I agree with the above note, plan 

## 2016-06-23 ENCOUNTER — Telehealth: Payer: Self-pay | Admitting: Hematology and Oncology

## 2016-06-23 ENCOUNTER — Telehealth: Payer: Self-pay

## 2016-06-23 NOTE — Telephone Encounter (Signed)
I'm hospital doc this week.  Can you see if she can come in for EGD at [facility] on Friday AM;  8:30 or 9am case?

## 2016-06-23 NOTE — Telephone Encounter (Signed)
SENT NOTES TO SCHEDULING 

## 2016-06-23 NOTE — Telephone Encounter (Signed)
Mailed records to [facility]/ risk adjustment °

## 2016-06-24 ENCOUNTER — Other Ambulatory Visit: Payer: Self-pay

## 2016-06-24 DIAGNOSIS — D509 Iron deficiency anemia, unspecified: Secondary | ICD-10-CM

## 2016-06-24 NOTE — Telephone Encounter (Signed)
Pt has been rescheduled to [facility] 06/26/16 830 am pt was re instructed and all questions answered.  [facility] procedure was cancelled.

## 2016-06-25 ENCOUNTER — Telehealth: Payer: Self-pay

## 2016-06-25 NOTE — Telephone Encounter (Signed)
-----   Message from [name], MD sent at 06/25/2016  2:15 PM EST ----- Hey, Can you make sure that S[facility] knows that her EGD has been changed to [facility] tomorrow instead of [facility]?  Endo said they spoke with her but I just wanna make sure. Thanks  dj

## 2016-06-25 NOTE — Telephone Encounter (Signed)
I spoke with the pt and confirmed the date, time and location. She confirmed [facility] is the location.  No questions at this time.

## 2016-06-26 ENCOUNTER — Ambulatory Visit (HOSPITAL_COMMUNITY)
Admission: RE | Admit: 2016-06-26 | Discharge: 2016-06-26 | Disposition: A | Discharge status: Home / Self Care | Payer: Medicare HMO | Source: Ambulatory Visit | Attending: Gastroenterology | Admitting: Gastroenterology

## 2016-06-26 ENCOUNTER — Encounter (HOSPITAL_COMMUNITY)
Admission: RE | Disposition: A | Discharge status: Home / Self Care | Payer: Self-pay | Source: Ambulatory Visit | Attending: Gastroenterology

## 2016-06-26 ENCOUNTER — Encounter (HOSPITAL_COMMUNITY): Payer: Self-pay

## 2016-06-26 DIAGNOSIS — R1013 Epigastric pain: Secondary | ICD-10-CM

## 2016-06-26 DIAGNOSIS — J45909 Unspecified asthma, uncomplicated: Secondary | ICD-10-CM | POA: Diagnosis not present

## 2016-06-26 DIAGNOSIS — K219 Gastro-esophageal reflux disease without esophagitis: Secondary | ICD-10-CM | POA: Insufficient documentation

## 2016-06-26 DIAGNOSIS — R69 Illness, unspecified: Secondary | ICD-10-CM | POA: Diagnosis not present

## 2016-06-26 DIAGNOSIS — D509 Iron deficiency anemia, unspecified: Secondary | ICD-10-CM | POA: Insufficient documentation

## 2016-06-26 DIAGNOSIS — G8929 Other chronic pain: Secondary | ICD-10-CM

## 2016-06-26 DIAGNOSIS — Z791 Long term (current) use of non-steroidal anti-inflammatories (NSAID): Secondary | ICD-10-CM | POA: Diagnosis not present

## 2016-06-26 DIAGNOSIS — K259 Gastric ulcer, unspecified as acute or chronic, without hemorrhage or perforation: Secondary | ICD-10-CM | POA: Diagnosis not present

## 2016-06-26 DIAGNOSIS — Z853 Personal history of malignant neoplasm of breast: Secondary | ICD-10-CM | POA: Insufficient documentation

## 2016-06-26 DIAGNOSIS — Z923 Personal history of irradiation: Secondary | ICD-10-CM | POA: Insufficient documentation

## 2016-06-26 DIAGNOSIS — F329 Major depressive disorder, single episode, unspecified: Secondary | ICD-10-CM | POA: Diagnosis not present

## 2016-06-26 DIAGNOSIS — I10 Essential (primary) hypertension: Secondary | ICD-10-CM | POA: Diagnosis not present

## 2016-06-26 DIAGNOSIS — M542 Cervicalgia: Secondary | ICD-10-CM | POA: Insufficient documentation

## 2016-06-26 DIAGNOSIS — G4700 Insomnia, unspecified: Secondary | ICD-10-CM | POA: Diagnosis not present

## 2016-06-26 DIAGNOSIS — K269 Duodenal ulcer, unspecified as acute or chronic, without hemorrhage or perforation: Secondary | ICD-10-CM | POA: Diagnosis not present

## 2016-06-26 DIAGNOSIS — E785 Hyperlipidemia, unspecified: Secondary | ICD-10-CM | POA: Diagnosis not present

## 2016-06-26 DIAGNOSIS — K2950 Unspecified chronic gastritis without bleeding: Secondary | ICD-10-CM | POA: Diagnosis not present

## 2016-06-26 DIAGNOSIS — D500 Iron deficiency anemia secondary to blood loss (chronic): Secondary | ICD-10-CM

## 2016-06-26 DIAGNOSIS — Z79899 Other long term (current) drug therapy: Secondary | ICD-10-CM | POA: Insufficient documentation

## 2016-06-26 DIAGNOSIS — K253 Acute gastric ulcer without hemorrhage or perforation: Secondary | ICD-10-CM | POA: Diagnosis not present

## 2016-06-26 HISTORY — PX: ESOPHAGOGASTRODUODENOSCOPY: SHX5428

## 2016-06-26 SURGERY — EGD (ESOPHAGOGASTRODUODENOSCOPY)
Anesthesia: Moderate Sedation

## 2016-06-26 MED ORDER — SODIUM CHLORIDE 0.9 % IV SOLN: INTRAVENOUS | Status: DC

## 2016-06-26 MED ORDER — MIDAZOLAM HCL 5 MG/ML IJ SOLN
INTRAMUSCULAR | Status: AC
  Filled 2016-06-26: qty 2

## 2016-06-26 MED ORDER — OMEPRAZOLE 40 MG PO CPDR: 40.0000 mg | DELAYED_RELEASE_CAPSULE | Freq: Two times a day (BID) | ORAL | 11 refills | Status: DC

## 2016-06-26 MED ORDER — FENTANYL CITRATE (PF) 100 MCG/2ML IJ SOLN
INTRAMUSCULAR | Status: DC | PRN
  Administered 2016-06-26: 12.5 ug via INTRAVENOUS
  Administered 2016-06-26: 25 ug via INTRAVENOUS

## 2016-06-26 MED ORDER — FENTANYL CITRATE (PF) 100 MCG/2ML IJ SOLN
INTRAMUSCULAR | Status: AC
  Filled 2016-06-26: qty 2

## 2016-06-26 MED ORDER — MIDAZOLAM HCL 10 MG/2ML IJ SOLN
INTRAMUSCULAR | Status: DC | PRN
  Administered 2016-06-26 (×2): 1 mg via INTRAVENOUS

## 2016-06-26 NOTE — Op Note (Signed)
[facility] Patient Name: Brandy [name] Procedure Date : 06/26/2016 MRN: [id number] Attending MD: [name] , MD Date of Birth: [birth year] CSN: [id number] Age: 82 Admit Type: Outpatient Procedure:                Upper GI endoscopy Indications:              Epigastric abdominal pain, Iron deficiency anemia;                            Four alleve daily for neck pain Providers:                [name], MD, Vista Lawman, RN, Cherylynn Ridges, Technician Referring MD:              Medicines:                Fentanyl 37.5 micrograms IV, Midazolam 2 mg IV Complications:            No immediate complications. Estimated blood loss:                            None. Estimated Blood Loss:     Estimated blood loss: none. Procedure:                Pre-Anesthesia Assessment:                           - Prior to the procedure, a History and Physical                            was performed, and patient medications and                            allergies were reviewed. The patient's tolerance of                            previous anesthesia was also reviewed. The risks                            and benefits of the procedure and the sedation                            options and risks were discussed with the patient.                            All questions were answered, and informed consent                            was obtained. Prior Anticoagulants: The patient has                            taken no previous anticoagulant or antiplatelet  agents. ASA Grade Assessment: III - A patient with                            severe systemic disease. After reviewing the risks                            and benefits, the patient was deemed in                            satisfactory condition to undergo the procedure.                           After obtaining informed consent, the endoscope was                            passed  under direct vision. Throughout the                            procedure, the patient's blood pressure, pulse, and                            oxygen saturations were monitored continuously. The                            [id number]) scope was introduced through the                            mouth, and advanced to the second part of duodenum.                            The upper GI endoscopy was accomplished without                            difficulty. The patient tolerated the procedure                            well. Scope In: Scope Out: Findings:      The esophagus was normal.      One non-bleeding cratered gastric ulcer with no stigmata of bleeding was       found at the incisura. The lesion was 6 mm in largest dimension.       Biopsies were taken (from moderate distal gastritis) with a cold forceps       for histology.      One non-bleeding cratered duodenal ulcer with no stigmata of bleeding       was found in the duodenal bulb. The lesion was 7 mm in largest dimension. Impression:               - Normal esophagus.                           - Non-bleeding gastric ulcer with no stigmata of                            bleeding. Distal stomach was  biopsied to check for                            H. pylori                           - One non-bleeding duodenal ulcer with no stigmata                            of bleeding. Moderate Sedation:      Moderate (conscious) sedation was administered by the endoscopy nurse       and supervised by the endoscopist. The following parameters were       monitored: oxygen saturation, heart rate, blood pressure, and response       to care. Total physician intraservice time was 15 minutes. Recommendation:           - Patient has a contact number available for                            emergencies. The signs and symptoms of potential                            delayed complications were discussed with the                            patient.  Return to normal activities tomorrow.                            Written discharge instructions were provided to the                            patient.                           - Resume previous diet.                           - New prescription for omeprazole called in (please                            take one pill twice daily 20-30 min prior to                            breakfast and dinner meals)                           - Stop (or significantly cut back) your Alleve.                            Take no other NSAIDs. Tylenol is safest for your                            stomach.                           - Await pathology results. Will start  on                            appropriate antibiotics if H. pylori is positive.                           - Will likely need repeat EGD in 2 months to check                            for gastric ulcer healing. Procedure Code(s):        --- Professional ---                           [phone], Esophagogastroduodenoscopy, flexible,                            transoral; with biopsy, single or multiple Diagnosis Code(s):        --- Professional ---                           K25.9, Gastric ulcer, unspecified as acute or                            chronic, without hemorrhage or perforation                           K26.9, Duodenal ulcer, unspecified as acute or                            chronic, without hemorrhage or perforation                           R10.13, Epigastric pain                           D50.9, Iron deficiency anemia, unspecified CPT copyright 2016 American Medical Association. All rights reserved. The codes documented in this report are preliminary and upon coder review may  be revised to meet current compliance requirements. [name], MD 06/26/2016 8:43:32 AM This report has been signed electronically. Number of Addenda: 0

## 2016-06-26 NOTE — Interval H&P Note (Signed)
History and Physical Interval Note:  06/26/2016 7:34 AM  Brandy [name]  has presented today for surgery, with the diagnosis of IDA  The various methods of treatment have been discussed with the patient and family. After consideration of risks, benefits and other options for treatment, the patient has consented to  Procedure(s): ESOPHAGOGASTRODUODENOSCOPY (EGD) (N/A) as a surgical intervention .  The patient's history has been reviewed, patient examined, no change in status, stable for surgery.  I have reviewed the patient's chart and labs.  Questions were answered to the patient's satisfaction.     [name]

## 2016-06-26 NOTE — Discharge Instructions (Signed)
YOU HAD AN ENDOSCOPIC PROCEDURE TODAY: Refer to the procedure report and other information in the discharge instructions given to you for any specific questions about what was found during the examination. If this information does not answer your questions, please call [place] office at [phone] to clarify.   YOU SHOULD EXPECT: Some feelings of bloating in the abdomen. Passage of more gas than usual. Walking can help get rid of the air that was put into your GI tract during the procedure and reduce the bloating. If you had a lower endoscopy (such as a colonoscopy or flexible sigmoidoscopy) you may notice spotting of blood in your stool or on the toilet paper. Some abdominal soreness may be present for a day or two, also.  DIET: Your first meal following the procedure should be a light meal and then it is ok to progress to your normal diet. A half-sandwich or bowl of soup is an example of a good first meal. Heavy or fried foods are harder to digest and may make you feel nauseous or bloated. Drink plenty of fluids but you should avoid alcoholic beverages for 24 hours. If you had a esophageal dilation, please see attached instructions for diet.    ACTIVITY: Your care partner should take you home directly after the procedure. You should plan to take it easy, moving slowly for the rest of the day. You can resume normal activity the day after the procedure however YOU SHOULD NOT DRIVE, use power tools, machinery or perform tasks that involve climbing or major physical exertion for 24 hours (because of the sedation medicines used during the test).   SYMPTOMS TO REPORT IMMEDIATELY: A gastroenterologist can be reached at any hour. Please call [phone]  for any of the following symptoms:   Following upper endoscopy (EGD, EUS, ERCP, esophageal dilation) Vomiting of blood or coffee ground material  New, significant abdominal pain  New, significant chest pain or pain under the shoulder blades  Painful or  persistently difficult swallowing  New shortness of breath  Black, tarry-looking or red, bloody stools  FOLLOW UP:  If any biopsies were taken you will be contacted by phone or by letter within the next 1-3 weeks. Call [phone]  if you have not heard about the biopsies in 3 weeks.  Please also call with any specific questions about appointments or follow up tests.  

## 2016-06-26 NOTE — H&P (View-Only) (Signed)
     HPI: Patient is an 82 year old female known to Dr. Ardis Hughs. 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 She is now referred by PCP, Dr. Kathyrn Lass for evaluation of anemia and heme positive stools. In late May hemoglobin was 10.8. Since then it has declined to 8.6, MCV 84, her ferritin is 31 and fecal occult blood is positive. Patient had been taking Aleve for neck pain. No abdominal pain. No nausea. No bowel changes or unexpected weight loss. She does have a history of GERD, was on omeprazole but out of medication for the last 3 weeks. She's been taking some of her granddaughter's Zantac which does not work as well as PPI.   Past Medical History:  Diagnosis Date  . Arthritis    shoulders  . Asthma    mild, no inhalers used  . Breast CA ([place]) 11/23/11   left breast 3 o'clock bx=high grade ductal ca in situ w/comedononecrosis and calcification,dcis,lymphovascular invasion present ,ER?PR+positive  . Breast cancer ([place]) 12/18/11   left breast masectomy=metastatic ca in (1/1) lymph node ,invasive ductal ca,2 foci,,dcis,lymph ovascular invasion identified,surgical resection margins neg for ca,additional tissue=benign skin and subcutaneous tissue  . Bronchitis    hx of;last time >66yr ago  . Cancer ([place])    left breast  . Depression    takes Celexa daily  . Difficult intubation   . Full dentures   . Gall stones 2015  . GERD (gastroesophageal reflux disease)    takes Prilosec prn  . H/O hiatal hernia   . History of kidney stones    many yrs ago  . Hx of radiation therapy 04/26/12 -06/10/12   left breast  . Hyperlipidemia    takes Simvastatin daily  . Hypertension    takes Amlodipine and Diovan daily  . Insomnia    takes Ambien nightly  . Urinary urgency     Patient's surgical history, family medical history, social history, medications and allergies were all reviewed in Epic    Physical Exam: BP (!)  128/48   Pulse 76   Ht 4' 10.75" (1.492 m) Comment: measured without shoes  Wt 177 lb 6 oz (80.5 kg)   BMI 36.13 kg/m   GENERAL: Obese white female in NAD PSYCH: :Pleasant, cooperative, normal affect HEENT: Normocephalic, conjunctiva pink, mucous membranes moist, neck supple without masses CARDIAC:  RRR,  no murmur is present, 2 plus bilateral lower extremity edema PULM: Normal respiratory effort, lungs CTA bilaterally, no wheezing ABDOMEN:  soft, nontender, nondistended, no obvious masses, no hepatomegaly,  normal bowel sounds SKIN:  turgor, no lesions seen NEURO: Alert and oriented x 3, no focal neurologic deficits   ASSESSMENT and PLAN:  82 yo year old with normocytic anemia, Hemoccult positive stools. Ferritin 31. Stools dark on iron but heme positive at PCPs office. Patient recently taking Aleve for neck pain, PCP has discontinued this. -we discussed DDx and options for further evaluation not excluding a watch and watch approach off NSAIDS. She is agreeable to EGD. If EGD negative I don't think patient is good candidate for colonoscopy given deconditioned state / history of falls.  -The risks and benefits of EGD were discussed and the patient agrees to proceed.  -continue PPI -Agree with stopping NSAIDS     Tye Savoy , NP 06/17/2016, 2:41 PM   Cc: Kathyrn Lass, MD

## 2016-07-07 ENCOUNTER — Encounter: Payer: Self-pay | Admitting: Gastroenterology

## 2016-07-08 ENCOUNTER — Encounter: Payer: Medicare HMO | Admitting: Gastroenterology

## 2016-07-15 ENCOUNTER — Ambulatory Visit (HOSPITAL_BASED_OUTPATIENT_CLINIC_OR_DEPARTMENT_OTHER): Payer: Medicare HMO

## 2016-07-15 VITALS — BP 146/54 | HR 88 | Temp 97.5°F | Resp 16

## 2016-07-15 DIAGNOSIS — M8580 Other specified disorders of bone density and structure, unspecified site: Secondary | ICD-10-CM

## 2016-07-15 MED ORDER — DENOSUMAB 60 MG/ML ~~LOC~~ SOLN
60.0000 mg | Freq: Once | SUBCUTANEOUS | Status: AC
  Administered 2016-07-15: 60 mg via SUBCUTANEOUS
  Filled 2016-07-15: qty 1

## 2016-07-15 NOTE — Patient Instructions (Signed)
Denosumab injection What is this medicine? DENOSUMAB (den oh sue mab) slows bone breakdown. Prolia is used to treat osteoporosis in women after menopause and in men. Xgeva is used to prevent bone fractures and other bone problems caused by cancer bone metastases. Xgeva is also used to treat giant cell tumor of the bone. This medicine may be used for other purposes; ask your health care provider or pharmacist if you have questions. COMMON BRAND NAME(S): Prolia, XGEVA What should I tell my health care provider before I take this medicine? They need to know if you have any of these conditions: -dental disease -eczema -infection or history of infections -kidney disease or on dialysis -low blood calcium or vitamin D -malabsorption syndrome -scheduled to have surgery or tooth extraction -taking medicine that contains denosumab -thyroid or parathyroid disease -an unusual reaction to denosumab, other medicines, foods, dyes, or preservatives -pregnant or trying to get pregnant -breast-feeding How should I use this medicine? This medicine is for injection under the skin. It is given by a health care professional in a hospital or clinic setting. If you are getting Prolia, a special MedGuide will be given to you by the pharmacist with each prescription and refill. Be sure to read this information carefully each time. For Prolia, talk to your pediatrician regarding the use of this medicine in children. Special care may be needed. For Xgeva, talk to your pediatrician regarding the use of this medicine in children. While this drug may be prescribed for children as young as 13 years for selected conditions, precautions do apply. Overdosage: If you think you've taken too much of this medicine contact a poison control center or emergency room at once. Overdosage: If you think you have taken too much of this medicine contact a poison control center or emergency room at once. NOTE: This medicine is only for  you. Do not share this medicine with others. What if I miss a dose? It is important not to miss your dose. Call your doctor or health care professional if you are unable to keep an appointment. What may interact with this medicine? Do not take this medicine with any of the following medications: -other medicines containing denosumab This medicine may also interact with the following medications: -medicines that suppress the immune system -medicines that treat cancer -steroid medicines like prednisone or cortisone This list may not describe all possible interactions. Give your health care provider a list of all the medicines, herbs, non-prescription drugs, or dietary supplements you use. Also tell them if you smoke, drink alcohol, or use illegal drugs. Some items may interact with your medicine. What should I watch for while using this medicine? Visit your doctor or health care professional for regular checks on your progress. Your doctor or health care professional may order blood tests and other tests to see how you are doing. Call your doctor or health care professional if you get a cold or other infection while receiving this medicine. Do not treat yourself. This medicine may decrease your body's ability to fight infection. You should make sure you get enough calcium and vitamin D while you are taking this medicine, unless your doctor tells you not to. Discuss the foods you eat and the vitamins you take with your health care professional. See your dentist regularly. Brush and floss your teeth as directed. Before you have any dental work done, tell your dentist you are receiving this medicine. Do not become pregnant while taking this medicine or for 5 months after stopping   it. Women should inform their doctor if they wish to become pregnant or think they might be pregnant. There is a potential for serious side effects to an unborn child. Talk to your health care professional or pharmacist for more  information. What side effects may I notice from receiving this medicine? Side effects that you should report to your doctor or health care professional as soon as possible: -allergic reactions like skin rash, itching or hives, swelling of the face, lips, or tongue -breathing problems -chest pain -fast, irregular heartbeat -feeling faint or lightheaded, falls -fever, chills, or any other sign of infection -muscle spasms, tightening, or twitches -numbness or tingling -skin blisters or bumps, or is dry, peels, or red -slow healing or unexplained pain in the mouth or jaw -unusual bleeding or bruising Side effects that usually do not require medical attention (Report these to your doctor or health care professional if they continue or are bothersome.): -muscle pain -stomach upset, gas This list may not describe all possible side effects. Call your doctor for medical advice about side effects. You may report side effects to FDA at 1-800-FDA-1088. Where should I keep my medicine? This medicine is only given in a clinic, doctor's office, or other health care setting and will not be stored at home. NOTE: This sheet is a summary. It may not cover all possible information. If you have questions about this medicine, talk to your doctor, pharmacist, or health care provider.  2015, Elsevier/Gold Standard. (2011-12-21 12:37:47)  

## 2016-08-05 ENCOUNTER — Other Ambulatory Visit (HOSPITAL_COMMUNITY): Payer: Self-pay | Admitting: Adult Health

## 2016-08-13 ENCOUNTER — Encounter: Payer: Self-pay | Admitting: Cardiology

## 2016-08-17 ENCOUNTER — Ambulatory Visit: Payer: Medicare HMO | Admitting: Cardiology

## 2016-08-26 ENCOUNTER — Telehealth: Payer: Self-pay

## 2016-08-26 ENCOUNTER — Ambulatory Visit (AMBULATORY_SURGERY_CENTER): Payer: Self-pay

## 2016-08-26 VITALS — Ht 59.5 in | Wt 170.0 lb

## 2016-08-26 DIAGNOSIS — K259 Gastric ulcer, unspecified as acute or chronic, without hemorrhage or perforation: Secondary | ICD-10-CM

## 2016-08-26 NOTE — Telephone Encounter (Signed)
Dr Ardis Hughs,      This is a f/u on an 83yo with gastric ulcer.  During her PV I noticed she has a hx of difficult intubation.  She is unaware of the details surrounding this part of her medical hx.  Please advise.                                                                                                     Thank you, Angela/PV

## 2016-08-26 NOTE — Progress Notes (Signed)
No allergies to eggs or soy No past problems with anesthesia No home oxygen No diet meds  Declined emmi 

## 2016-08-27 ENCOUNTER — Other Ambulatory Visit: Payer: Self-pay

## 2016-08-27 DIAGNOSIS — K259 Gastric ulcer, unspecified as acute or chronic, without hemorrhage or perforation: Secondary | ICD-10-CM

## 2016-08-27 NOTE — Telephone Encounter (Signed)
Ok, good pick up.  Probably safest to do it at the hosp.  Patty, She needs EGD at [facility]  With MAC, next available for gastric ulcer follow up.  Thanks

## 2016-08-27 NOTE — Telephone Encounter (Signed)
Pt appt has been changed to [facility] and pt notified instructions will be mailed to the pt home.  The pt verbalized understanding.

## 2016-09-01 ENCOUNTER — Other Ambulatory Visit: Payer: Self-pay | Admitting: *Deleted

## 2016-09-01 DIAGNOSIS — C50412 Malignant neoplasm of upper-outer quadrant of left female breast: Secondary | ICD-10-CM

## 2016-09-01 MED ORDER — GABAPENTIN 300 MG PO CAPS: 300.0000 mg | ORAL_CAPSULE | Freq: Every day | ORAL | 3 refills | Status: DC

## 2016-09-04 ENCOUNTER — Other Ambulatory Visit: Payer: Self-pay | Admitting: Medical Oncology

## 2016-09-07 ENCOUNTER — Encounter: Payer: Medicare HMO | Admitting: Gastroenterology

## 2016-09-08 ENCOUNTER — Encounter (HOSPITAL_COMMUNITY): Payer: Self-pay | Admitting: *Deleted

## 2016-09-10 ENCOUNTER — Encounter (HOSPITAL_COMMUNITY)
Admission: RE | Disposition: A | Discharge status: Home / Self Care | Payer: Self-pay | Source: Ambulatory Visit | Attending: Gastroenterology

## 2016-09-10 ENCOUNTER — Encounter (HOSPITAL_COMMUNITY): Payer: Self-pay

## 2016-09-10 ENCOUNTER — Telehealth: Payer: Self-pay

## 2016-09-10 ENCOUNTER — Ambulatory Visit (HOSPITAL_COMMUNITY): Payer: Medicare HMO | Admitting: Certified Registered Nurse Anesthetist

## 2016-09-10 ENCOUNTER — Ambulatory Visit (HOSPITAL_COMMUNITY)
Admission: RE | Admit: 2016-09-10 | Discharge: 2016-09-10 | Disposition: A | Discharge status: Home / Self Care | Payer: Medicare HMO | Source: Ambulatory Visit | Attending: Gastroenterology | Admitting: Gastroenterology

## 2016-09-10 DIAGNOSIS — Z853 Personal history of malignant neoplasm of breast: Secondary | ICD-10-CM | POA: Insufficient documentation

## 2016-09-10 DIAGNOSIS — Z79899 Other long term (current) drug therapy: Secondary | ICD-10-CM | POA: Diagnosis not present

## 2016-09-10 DIAGNOSIS — E785 Hyperlipidemia, unspecified: Secondary | ICD-10-CM | POA: Insufficient documentation

## 2016-09-10 DIAGNOSIS — F329 Major depressive disorder, single episode, unspecified: Secondary | ICD-10-CM | POA: Insufficient documentation

## 2016-09-10 DIAGNOSIS — M1990 Unspecified osteoarthritis, unspecified site: Secondary | ICD-10-CM | POA: Diagnosis not present

## 2016-09-10 DIAGNOSIS — Z791 Long term (current) use of non-steroidal anti-inflammatories (NSAID): Secondary | ICD-10-CM | POA: Diagnosis not present

## 2016-09-10 DIAGNOSIS — K449 Diaphragmatic hernia without obstruction or gangrene: Secondary | ICD-10-CM | POA: Diagnosis not present

## 2016-09-10 DIAGNOSIS — K2980 Duodenitis without bleeding: Secondary | ICD-10-CM | POA: Diagnosis not present

## 2016-09-10 DIAGNOSIS — J45909 Unspecified asthma, uncomplicated: Secondary | ICD-10-CM | POA: Diagnosis not present

## 2016-09-10 DIAGNOSIS — M19011 Primary osteoarthritis, right shoulder: Secondary | ICD-10-CM | POA: Diagnosis not present

## 2016-09-10 DIAGNOSIS — M19012 Primary osteoarthritis, left shoulder: Secondary | ICD-10-CM | POA: Diagnosis not present

## 2016-09-10 DIAGNOSIS — K2970 Gastritis, unspecified, without bleeding: Secondary | ICD-10-CM | POA: Diagnosis not present

## 2016-09-10 DIAGNOSIS — Z8711 Personal history of peptic ulcer disease: Secondary | ICD-10-CM | POA: Diagnosis not present

## 2016-09-10 DIAGNOSIS — Z8719 Personal history of other diseases of the digestive system: Secondary | ICD-10-CM | POA: Diagnosis not present

## 2016-09-10 DIAGNOSIS — K219 Gastro-esophageal reflux disease without esophagitis: Secondary | ICD-10-CM | POA: Insufficient documentation

## 2016-09-10 DIAGNOSIS — G4700 Insomnia, unspecified: Secondary | ICD-10-CM | POA: Diagnosis not present

## 2016-09-10 DIAGNOSIS — K259 Gastric ulcer, unspecified as acute or chronic, without hemorrhage or perforation: Secondary | ICD-10-CM

## 2016-09-10 DIAGNOSIS — I10 Essential (primary) hypertension: Secondary | ICD-10-CM | POA: Diagnosis not present

## 2016-09-10 DIAGNOSIS — R69 Illness, unspecified: Secondary | ICD-10-CM | POA: Diagnosis not present

## 2016-09-10 DIAGNOSIS — K269 Duodenal ulcer, unspecified as acute or chronic, without hemorrhage or perforation: Secondary | ICD-10-CM | POA: Diagnosis not present

## 2016-09-10 DIAGNOSIS — Z9221 Personal history of antineoplastic chemotherapy: Secondary | ICD-10-CM | POA: Insufficient documentation

## 2016-09-10 HISTORY — DX: Anemia, unspecified: D64.9

## 2016-09-10 HISTORY — PX: ESOPHAGOGASTRODUODENOSCOPY (EGD) WITH PROPOFOL: SHX5813

## 2016-09-10 LAB — CBC
HEMATOCRIT: 27.8 % — AB (ref 36.0–46.0)
Hemoglobin: 8.9 g/dL — ABNORMAL LOW (ref 12.0–15.0)
MCH: 27.1 pg (ref 26.0–34.0)
MCHC: 32 g/dL (ref 30.0–36.0)
MCV: 84.5 fL (ref 78.0–100.0)
PLATELETS: 242 10*3/uL (ref 150–400)
RBC: 3.29 MIL/uL — ABNORMAL LOW (ref 3.87–5.11)
RDW: 17.7 % — AB (ref 11.5–15.5)
WBC: 13.2 10*3/uL — AB (ref 4.0–10.5)

## 2016-09-10 SURGERY — ESOPHAGOGASTRODUODENOSCOPY (EGD) WITH PROPOFOL
Anesthesia: Monitor Anesthesia Care

## 2016-09-10 MED ORDER — PROMETHAZINE HCL 25 MG/ML IJ SOLN: 6.2500 mg | INTRAMUSCULAR | Status: DC | PRN

## 2016-09-10 MED ORDER — OXYCODONE HCL 5 MG/5ML PO SOLN: 5.0000 mg | Freq: Once | ORAL | Status: DC | PRN

## 2016-09-10 MED ORDER — SODIUM CHLORIDE 0.9 % IV SOLN: INTRAVENOUS | Status: DC

## 2016-09-10 MED ORDER — LIDOCAINE 2% (20 MG/ML) 5 ML SYRINGE
INTRAMUSCULAR | Status: DC | PRN
  Administered 2016-09-10: 100 mg via INTRAVENOUS

## 2016-09-10 MED ORDER — ONDANSETRON HCL 4 MG/2ML IJ SOLN
INTRAMUSCULAR | Status: DC | PRN
  Administered 2016-09-10: 4 mg via INTRAVENOUS

## 2016-09-10 MED ORDER — LIDOCAINE 2% (20 MG/ML) 5 ML SYRINGE
INTRAMUSCULAR | Status: AC
  Filled 2016-09-10: qty 5

## 2016-09-10 MED ORDER — PROPOFOL 10 MG/ML IV BOLUS
INTRAVENOUS | Status: AC
  Filled 2016-09-10: qty 60

## 2016-09-10 MED ORDER — PROPOFOL 500 MG/50ML IV EMUL
INTRAVENOUS | Status: DC | PRN
  Administered 2016-09-10: 75 ug/kg/min via INTRAVENOUS

## 2016-09-10 MED ORDER — ONDANSETRON HCL 4 MG/2ML IJ SOLN
INTRAMUSCULAR | Status: AC
  Filled 2016-09-10: qty 2

## 2016-09-10 MED ORDER — LACTATED RINGERS IV SOLN
INTRAVENOUS | Status: DC
  Administered 2016-09-10: 07:00:00 via INTRAVENOUS

## 2016-09-10 MED ORDER — PHENYLEPHRINE 40 MCG/ML (10ML) SYRINGE FOR IV PUSH (FOR BLOOD PRESSURE SUPPORT)
PREFILLED_SYRINGE | INTRAVENOUS | Status: DC | PRN
  Administered 2016-09-10: 80 ug via INTRAVENOUS
  Administered 2016-09-10: 120 ug via INTRAVENOUS

## 2016-09-10 MED ORDER — EPHEDRINE SULFATE-NACL 50-0.9 MG/10ML-% IV SOSY
PREFILLED_SYRINGE | INTRAVENOUS | Status: DC | PRN
  Administered 2016-09-10: 10 mg via INTRAVENOUS

## 2016-09-10 MED ORDER — OXYCODONE HCL 5 MG PO TABS: 5.0000 mg | ORAL_TABLET | Freq: Once | ORAL | Status: DC | PRN

## 2016-09-10 MED ORDER — PROPOFOL 10 MG/ML IV BOLUS
INTRAVENOUS | Status: DC | PRN
  Administered 2016-09-10: 20 mg via INTRAVENOUS
  Administered 2016-09-10 (×3): 10 mg via INTRAVENOUS

## 2016-09-10 SURGICAL SUPPLY — 14 items

## 2016-09-10 NOTE — Telephone Encounter (Signed)
-----   Message from [name], MD sent at 09/10/2016  8:22 AM EST ----- She needs rov with me in 6-7 weeks.  Thanks  When you call to set this up please remind her that she should be completely avoiding NSAIDs,

## 2016-09-10 NOTE — Discharge Instructions (Signed)

## 2016-09-10 NOTE — Telephone Encounter (Signed)
See result note.  

## 2016-09-10 NOTE — Anesthesia Preprocedure Evaluation (Addendum)
Anesthesia Evaluation  Patient identified by MRN, date of birth, ID band Patient awake    Reviewed: Allergy & Precautions, NPO status , Patient's Chart, lab work & pertinent test results  History of Anesthesia Complications (+) DIFFICULT AIRWAY  Airway Mallampati: I  TM Distance: >3 FB Neck ROM: Full    Dental no notable dental hx. (+) Upper Dentures, Lower Dentures   Pulmonary neg pulmonary ROS,    Pulmonary exam normal breath sounds clear to auscultation       Cardiovascular hypertension, negative cardio ROS Normal cardiovascular exam Rhythm:Regular Rate:Normal + Systolic murmurs    Neuro/Psych PSYCHIATRIC DISORDERS Depression negative neurological ROS  negative psych ROS   GI/Hepatic negative GI ROS, Neg liver ROS, hiatal hernia, PUD, GERD  ,  Endo/Other  negative endocrine ROS  Renal/GU negative Renal ROS  negative genitourinary   Musculoskeletal negative musculoskeletal ROS (+) Arthritis ,   Abdominal   Peds negative pediatric ROS (+)  Hematology negative hematology ROS (+) anemia ,   Anesthesia Other Findings   Reproductive/Obstetrics negative OB ROS                            Anesthesia Physical Anesthesia Plan  ASA: III  Anesthesia Plan: General and MAC   Post-op Pain Management:    Induction: Intravenous  Airway Management Planned: Simple Face Mask  Additional Equipment:   Intra-op Plan:   Post-operative Plan: Extubation in OR  Informed Consent: I have reviewed the patients History and Physical, chart, labs and discussed the procedure including the risks, benefits and alternatives for the proposed anesthesia with the patient or authorized representative who has indicated his/her understanding and acceptance.   Dental advisory given  Plan Discussed with: CRNA and Surgeon  Anesthesia Plan Comments:         Anesthesia Quick Evaluation

## 2016-09-10 NOTE — Transfer of Care (Signed)
Immediate Anesthesia Transfer of Care Note  Patient: Brandy [name]  Procedure(s) Performed: Procedure(s): ESOPHAGOGASTRODUODENOSCOPY (EGD) WITH PROPOFOL (N/A)  Patient Location: ENDO  Anesthesia Type:MAC  Level of Consciousness:  sedated, patient cooperative and responds to stimulation  Airway & Oxygen Therapy:Patient Spontanous Breathing and Patient connected to face mask oxgen  Post-op Assessment:  Report given to ENDO RN and Post -op Vital signs reviewed and stable  Post vital signs:  Reviewed and stable  Last Vitals:  Vitals:   09/10/16 0630 09/10/16 0800  BP: (!) 156/48 (!) 126/39  Pulse: 63 74  Resp: 19 19  Temp: 48.2 C     Complications: No apparent anesthesia complications

## 2016-09-10 NOTE — H&P (Signed)
HPI: This is an 83 yo woman  Chief complaint is gastric ulcer 2 months ago, likely nsaid related  ROS: complete GI ROS as described in HPI.  Constitutional:  No unintentional weight loss   Past Medical History:  Diagnosis Date  . Anemia   . Arthritis    shoulders  . Asthma    mild, no inhalers used  . Breast CA ([place]) 11/23/11   left breast 3 o'clock bx=high grade ductal ca in situ w/comedononecrosis and calcification,dcis,lymphovascular invasion present ,ER?PR+positive  . Breast cancer ([place]) 12/18/11   left breast masectomy=metastatic ca in (1/1) lymph node ,invasive ductal ca,2 foci,,dcis,lymph ovascular invasion identified,surgical resection margins neg for ca,additional tissue=benign skin and subcutaneous tissue  . Bronchitis    hx of;last time >64yr ago  . Cancer ([place])    left breast  . Depression    takes Celexa daily  . Difficult intubation   . Dysphagia   . Full dentures   . Gall stones 2015  . GERD (gastroesophageal reflux disease)    takes Prilosec prn  . H/O hiatal hernia   . History of kidney stones    many yrs ago  . Hx of radiation therapy 04/26/12 -06/10/12   left breast  . Hyperlipidemia    takes Simvastatin daily  . Hypertension    takes Amlodipine and Diovan daily  . Insomnia    takes Ambien nightly  . Urinary urgency     Past Surgical History:  Procedure Laterality Date  . APPENDECTOMY    . bladder tack    . BREAST BIOPSY  1998   left   . DILATION AND CURETTAGE OF UTERUS    . ENDOSCOPIC RETROGRADE CHOLANGIOPANCREATOGRAPHY (ERCP) WITH PROPOFOL N/A 02/01/2014   Procedure: ENDOSCOPIC RETROGRADE CHOLANGIOPANCREATOGRAPHY (ERCP) WITH PROPOFOL;  Surgeon: [name], MD;  Location: [facility];  Service: Endoscopy;  Laterality: N/A;  . ESOPHAGOGASTRODUODENOSCOPY    . ESOPHAGOGASTRODUODENOSCOPY N/A 06/26/2016   Procedure: ESOPHAGOGASTRODUODENOSCOPY (EGD);  Surgeon: [name], MD;  Location: [facility];  Service: Endoscopy;  Laterality: N/A;   . ESOPHAGOGASTRODUODENOSCOPY (EGD) WITH PROPOFOL  12/19/2013   Procedure: ESOPHAGOGASTRODUODENOSCOPY (EGD) WITH PROPOFOL;  Surgeon: Beryle Beams, MD;  Location: [facility] ENDOSCOPY;  Service: Endoscopy;;  . EUS  06/04/2011   Procedure: UPPER ENDOSCOPIC ULTRASOUND (EUS) LINEAR;  Surgeon: Owens Loffler, MD;  [facility];  Service: Endoscopy;  Laterality: N/A;  . EUS N/A 02/01/2014   Procedure: UPPER ENDOSCOPIC ULTRASOUND (EUS) LINEAR;  Surgeon: [name], MD;  [facility];  Service: Endoscopy;  Laterality: N/A;  . EXPLORATORY LAPAROTOMY      biopsy of intra-abdominal mass  . MASTECTOMY W/ SENTINEL NODE BIOPSY  12/18/2011   Procedure: MASTECTOMY WITH SENTINEL LYMPH NODE BIOPSY;  Surgeon: Rolm Bookbinder, MD;  Location: [facility];  Service: General;  Laterality: Left;  . PORT-A-CATH REMOVAL Right 06/15/2013   Procedure: REMOVAL PORT-A-CATH;  Surgeon: Rolm Bookbinder, MD;  Location: [facility];  Service: General;  Laterality: Right;  . PORTACATH PLACEMENT  01/27/2012   Procedure: INSERTION PORT-A-CATH;  Surgeon: Rolm Bookbinder, MD;  Location: [facility];  Service: General;  Laterality: Right;  PORT PLACEMENT  . TOTAL SHOULDER REPLACEMENT  2011   left  . TUBAL LIGATION      Current Facility-Administered Medications  Medication Dose Route Frequency Provider Last Rate Last Dose  . 0.9 %  sodium chloride infusion   Intravenous Continuous [name], MD      . lactated ringers infusion  Intravenous Continuous [name], MD       Facility-Administered Medications Ordered in Other Encounters  Medication Dose Route Frequency Provider Last Rate Last Dose  . sodium chloride 0.9 % injection 10 mL  10 mL Intracatheter PRN Marcy Panning, MD   10 mL at 03/14/12 1628    Allergies as of 08/27/2016  . (No Known Allergies)    Family History  Problem Relation Age of Onset  . Prostate cancer Father   . Breast cancer Other     Social  History   Social History  . Marital status: Married    Spouse name: N/A  . Number of children: N/A  . Years of education: N/A   Occupational History  . Not on file.   Social History Main Topics  . Smoking status: Never Smoker  . Smokeless tobacco: Never Used  . Alcohol use No  . Drug use: No  . Sexual activity: Yes    Birth control/ protection: Surgical     Comment: mensus age 60, 15st pregnancy 29, no hrt gg4,p3, 1 babay lived a few hours complications   Other Topics Concern  . Not on file   Social History Narrative  . No narrative on file     Physical Exam: BP (!) 156/48   Pulse 63   Temp 97.8 F (36.6 C) (Oral)   Resp 19   Ht 4' 11.5" (1.511 m)   Wt 170 lb (77.1 kg)   SpO2 95%   BMI 33.76 kg/m  Constitutional: generally well-appearing Psychiatric: alert and oriented x3 Abdomen: soft, nontender, nondistended, no obvious ascites, no peritoneal signs, normal bowel sounds No peripheral edema noted in lower extremities  Assessment and plan: 83 y.o. female with gastric ulcer 2-3 months ago  For follow up EGD to confirm healing.    Please see the "Patient Instructions" section for addition details about the plan.  Owens Loffler, MD [facility] 09/10/2016, 7:21 AM

## 2016-09-10 NOTE — Anesthesia Postprocedure Evaluation (Signed)
Anesthesia Post Note  Patient: Kamera Dubas Aerts  Procedure(s) Performed: Procedure(s) (LRB): ESOPHAGOGASTRODUODENOSCOPY (EGD) WITH PROPOFOL (N/A)  Patient location during evaluation: PACU Anesthesia Type: MAC Level of consciousness: awake and alert Pain management: pain level controlled Vital Signs Assessment: post-procedure vital signs reviewed and stable Respiratory status: spontaneous breathing, nonlabored ventilation, respiratory function stable and patient connected to nasal cannula oxygen Cardiovascular status: blood pressure returned to baseline and stable Postop Assessment: no signs of nausea or vomiting Anesthetic complications: no       Last Vitals:  Vitals:   09/10/16 0630 09/10/16 0800  BP: (!) 156/48 (!) 126/39  Pulse: 63 74  Resp: 19 19  Temp: 36.6 C 36.5 C    Last Pain:  Vitals:   09/10/16 0800  TempSrc: Oral                 Lynda Rainwater

## 2016-09-10 NOTE — Op Note (Addendum)
[facility] Patient Name: Brandy [name] Procedure Date: 09/10/2016 MRN: [id number] Attending MD: [name] , MD Date of Birth: 12/05/1932 CSN: [id number] Age: 83 Admit Type: Outpatient Procedure:                Upper GI endoscopy Indications:              Personal history of peptic ulcer disease (incisure                            ulcer and duodenal ulcer 06/2016; H. pylori neg,                            presumed from multiple daily alleve) Providers:                [name], MD, Carolynn Comment, RN, Alfonso Patten, Technician, Adair Laundry, CRNA Referring MD:              Medicines:                Monitored Anesthesia Care Complications:            No immediate complications. Estimated blood loss:                            None. Estimated Blood Loss:     Estimated blood loss: none. Procedure:                Pre-Anesthesia Assessment:                           - Prior to the procedure, a History and Physical                            was performed, and patient medications and                            allergies were reviewed. The patient's tolerance of                            previous anesthesia was also reviewed. The risks                            and benefits of the procedure and the sedation                            options and risks were discussed with the patient.                            All questions were answered, and informed consent                            was obtained. Prior Anticoagulants: The patient has  taken no previous anticoagulant or antiplatelet                            agents. ASA Grade Assessment: II - A patient with                            mild systemic disease. After reviewing the risks                            and benefits, the patient was deemed in                            satisfactory condition to undergo the procedure.                           After  obtaining informed consent, the endoscope was                            passed under direct vision. Throughout the                            procedure, the patient's blood pressure, pulse, and                            oxygen saturations were monitored continuously. The                            [id number]) scope was introduced through the                            mouth, and advanced to the second part of duodenum.                            The upper GI endoscopy was accomplished without                            difficulty. The patient tolerated the procedure                            well. Scope In: Scope Out: Findings:      The esophagus was normal.      There was moderate linear gastritis throughout the stomach with trace       hematin. The gastric mucosa was very friable.      The previously noted incisure ulcer (gastric) has healed with typical       appearing stellate mucosal as residual.      The previously noted duodenal bulb ulcer has mainly healed, only small       crater with adherent small blood clot was present.      One non-bleeding cratered gastric ulcer with no stigmata of bleeding was       found at the pylorus. The lesion was 5 mm in largest dimension. Biopsies       were taken with a cold forceps for histology.      The exam was otherwise without abnormality.  Impression:               - Gastritis is more signficant today than 3 months                            ago. The previous incisure ulcer has healed and the                            duodenal bulb ulcer is much smaller however there                            is a new clean based ulcer in pyloric channel.                            Multiple biopsies were taken around this ulcer and                            sent to path. Moderate Sedation:      N/A- Per Anesthesia Care Recommendation:           - Patient has a contact number available for                            emergencies. The signs and  symptoms of potential                            delayed complications were discussed with the                            patient. Return to normal activities tomorrow.                            Written discharge instructions were provided to the                            patient.                           - Resume previous diet.                           - After the procedure she admitted that she is                            STILL TAKING multiple alleve daily despite my clear                            recommendations 3 months ago. I again advised her                            and her daughter about this. Recommended she try                            tylenol for her neck pains and if that is not  enough she should call her PCP about the neck pains.                           - CBC today.                           - Await pathology results.                           - Office visit with me in 5-6 weeks. Procedure Code(s):        --- Professional ---                           [phone], Esophagogastroduodenoscopy, flexible,                            transoral; with biopsy, single or multiple Diagnosis Code(s):        --- Professional ---                           K25.9, Gastric ulcer, unspecified as acute or                            chronic, without hemorrhage or perforation                           Z87.11, Personal history of peptic ulcer disease CPT copyright 2016 American Medical Association. All rights reserved. The codes documented in this report are preliminary and upon coder review may  be revised to meet current compliance requirements. [name], MD 09/10/2016 8:00:52 AM This report has been signed electronically. Number of Addenda: 0

## 2016-09-13 ENCOUNTER — Encounter (HOSPITAL_COMMUNITY): Payer: Self-pay | Admitting: Gastroenterology

## 2016-09-24 ENCOUNTER — Ambulatory Visit (INDEPENDENT_AMBULATORY_CARE_PROVIDER_SITE_OTHER): Payer: Medicare HMO | Admitting: Cardiology

## 2016-09-24 ENCOUNTER — Encounter: Payer: Self-pay | Admitting: Cardiology

## 2016-09-24 VITALS — BP 122/62 | HR 66 | Ht 59.5 in | Wt 176.0 lb

## 2016-09-24 DIAGNOSIS — I350 Nonrheumatic aortic (valve) stenosis: Secondary | ICD-10-CM

## 2016-09-24 DIAGNOSIS — G5600 Carpal tunnel syndrome, unspecified upper limb: Secondary | ICD-10-CM | POA: Insufficient documentation

## 2016-09-24 DIAGNOSIS — E538 Deficiency of other specified B group vitamins: Secondary | ICD-10-CM | POA: Insufficient documentation

## 2016-09-24 DIAGNOSIS — R9431 Abnormal electrocardiogram [ECG] [EKG]: Secondary | ICD-10-CM

## 2016-09-24 DIAGNOSIS — M25561 Pain in right knee: Secondary | ICD-10-CM | POA: Insufficient documentation

## 2016-09-24 DIAGNOSIS — M47812 Spondylosis without myelopathy or radiculopathy, cervical region: Secondary | ICD-10-CM | POA: Insufficient documentation

## 2016-09-24 DIAGNOSIS — M47816 Spondylosis without myelopathy or radiculopathy, lumbar region: Secondary | ICD-10-CM | POA: Insufficient documentation

## 2016-09-24 DIAGNOSIS — R0600 Dyspnea, unspecified: Secondary | ICD-10-CM

## 2016-09-24 DIAGNOSIS — E784 Other hyperlipidemia: Secondary | ICD-10-CM | POA: Diagnosis not present

## 2016-09-24 DIAGNOSIS — F411 Generalized anxiety disorder: Secondary | ICD-10-CM | POA: Insufficient documentation

## 2016-09-24 DIAGNOSIS — E7800 Pure hypercholesterolemia, unspecified: Secondary | ICD-10-CM | POA: Insufficient documentation

## 2016-09-24 DIAGNOSIS — R0609 Other forms of dyspnea: Secondary | ICD-10-CM | POA: Insufficient documentation

## 2016-09-24 DIAGNOSIS — D510 Vitamin B12 deficiency anemia due to intrinsic factor deficiency: Secondary | ICD-10-CM | POA: Insufficient documentation

## 2016-09-24 DIAGNOSIS — E559 Vitamin D deficiency, unspecified: Secondary | ICD-10-CM | POA: Insufficient documentation

## 2016-09-24 DIAGNOSIS — I10 Essential (primary) hypertension: Secondary | ICD-10-CM

## 2016-09-24 DIAGNOSIS — E7849 Other hyperlipidemia: Secondary | ICD-10-CM

## 2016-09-24 DIAGNOSIS — M47817 Spondylosis without myelopathy or radiculopathy, lumbosacral region: Secondary | ICD-10-CM | POA: Insufficient documentation

## 2016-09-24 DIAGNOSIS — J45909 Unspecified asthma, uncomplicated: Secondary | ICD-10-CM | POA: Insufficient documentation

## 2016-09-24 DIAGNOSIS — G479 Sleep disorder, unspecified: Secondary | ICD-10-CM | POA: Insufficient documentation

## 2016-09-24 DIAGNOSIS — R195 Other fecal abnormalities: Secondary | ICD-10-CM | POA: Insufficient documentation

## 2016-09-24 NOTE — Patient Instructions (Signed)
Medication Instructions:   Your physician recommends that you continue on your current medications as directed. Please refer to the Current Medication list given to you today.     Testing/Procedures:   Your physician has requested that you have an echocardiogram. Echocardiography is a painless test that uses sound waves to create images of your heart. It provides your doctor with information about the size and shape of your heart and how well your heart's chambers and valves are working. This procedure takes approximately one hour. There are no restrictions for this procedure.    Follow-Up:  3 MONTHS WITH DR NELSON       If you need a refill on your cardiac medications before your next appointment, please call your pharmacy.   

## 2016-09-24 NOTE — Progress Notes (Signed)
Cardiology Office Note    Date:  09/24/2016   ID:  Brandy [name], DOB 1932/08/11, MRN [id number]  PCP:  Tawanna Solo, MD  Cardiologist:  Ena Dawley, MD  Referring physician:  Tawanna Solo, MD    History of Present Illness:  Brandy [name] is a 83 y.o. female with prior medical history of hypertension, hyperlipidemia invasive ductal carcinoma in her left breast status post mastectomy and chemotherapy in 2013 now cancer free, who is being referred to [place] for concern of dyspnea on exertion and abnormal echocardiogram. The patient states that she is fully independent but in the last couple of years she has been experiencing progressively worsening dyspnea on exertion to the point where she has to stop several times performing simple tasks of daily living. She is also experiencing mild exertional dizziness and dizziness when she stands up but no fall. She denies any palpitations. She is not aware of any chest pain. She has never smoked. She has been compliant with her medicines and has no side effects. He has never seen a cardiologist and she has never had a stress test.   Past Medical History:  Diagnosis Date  . Anemia   . Arthritis    shoulders  . Asthma    mild, no inhalers used  . Breast CA ([place]) 11/23/11   left breast 3 o'clock bx=high grade ductal ca in situ w/comedononecrosis and calcification,dcis,lymphovascular invasion present ,ER?PR+positive  . Breast cancer ([place]) 12/18/11   left breast masectomy=metastatic ca in (1/1) lymph node ,invasive ductal ca,2 foci,,dcis,lymph ovascular invasion identified,surgical resection margins neg for ca,additional tissue=benign skin and subcutaneous tissue  . Bronchitis    hx of;last time >28yr ago  . Cancer ([place])    left breast  . Depression    takes Celexa daily  . Difficult intubation   . Dysphagia   . Full dentures   . Gall stones 2015  . GERD (gastroesophageal reflux disease)    takes Prilosec prn  . H/O hiatal hernia   .  History of kidney stones    many yrs ago  . Hx of radiation therapy 04/26/12 -06/10/12   left breast  . Hyperlipidemia    takes Simvastatin daily  . Hypertension    takes Amlodipine and Diovan daily  . Insomnia    takes Ambien nightly  . Urinary urgency     Past Surgical History:  Procedure Laterality Date  . APPENDECTOMY    . bladder tack    . BREAST BIOPSY  1998   left   . DILATION AND CURETTAGE OF UTERUS    . ENDOSCOPIC RETROGRADE CHOLANGIOPANCREATOGRAPHY (ERCP) WITH PROPOFOL N/A 02/01/2014   Procedure: ENDOSCOPIC RETROGRADE CHOLANGIOPANCREATOGRAPHY (ERCP) WITH PROPOFOL;  Surgeon: [name], MD;  [facility];  Service: Endoscopy;  Laterality: N/A;  . ESOPHAGOGASTRODUODENOSCOPY    . ESOPHAGOGASTRODUODENOSCOPY N/A 06/26/2016   Procedure: ESOPHAGOGASTRODUODENOSCOPY (EGD);  Surgeon: [name], MD;  Location: [facility];  Service: Endoscopy;  Laterality: N/A;  . ESOPHAGOGASTRODUODENOSCOPY (EGD) WITH PROPOFOL  12/19/2013   Procedure: ESOPHAGOGASTRODUODENOSCOPY (EGD) WITH PROPOFOL;  Surgeon: Beryle Beams, MD;  Location: [facility] [place];  Service: Endoscopy;;  . ESOPHAGOGASTRODUODENOSCOPY (EGD) WITH PROPOFOL N/A 09/10/2016   Procedure: ESOPHAGOGASTRODUODENOSCOPY (EGD) WITH PROPOFOL;  Surgeon: [name], MD;  [facility];  Service: Endoscopy;  Laterality: N/A;  . EUS  06/04/2011   Procedure: UPPER ENDOSCOPIC ULTRASOUND (EUS) LINEAR;  Surgeon: Owens Loffler, MD;  [facility];  Service: Endoscopy;  Laterality: N/A;  . EUS  N/A 02/01/2014   Procedure: UPPER ENDOSCOPIC ULTRASOUND (EUS) LINEAR;  Surgeon: [name], MD;  [facility];  Service: Endoscopy;  Laterality: N/A;  . EXPLORATORY LAPAROTOMY      biopsy of intra-abdominal mass  . MASTECTOMY W/ SENTINEL NODE BIOPSY  12/18/2011   Procedure: MASTECTOMY WITH SENTINEL LYMPH NODE BIOPSY;  Surgeon: Rolm Bookbinder, MD;  Location: [place];  Service: General;  Laterality: Left;  .  PORT-A-CATH REMOVAL Right 06/15/2013   Procedure: REMOVAL PORT-A-CATH;  Surgeon: Rolm Bookbinder, MD;  Location: [facility];  Service: General;  Laterality: Right;  . PORTACATH PLACEMENT  01/27/2012   Procedure: INSERTION PORT-A-CATH;  Surgeon: Rolm Bookbinder, MD;  Location: [place] [facility];  Service: General;  Laterality: Right;  PORT PLACEMENT  . TOTAL SHOULDER REPLACEMENT  2011   left  . TUBAL LIGATION      Current Medications: Outpatient Medications Prior to Visit  Medication Sig Dispense Refill  . amLODipine (NORVASC) 10 MG tablet Take 10 mg by mouth every morning.     Marland Kitchen atorvastatin (LIPITOR) 40 MG tablet Take 1 tablet by mouth daily.    . citalopram (CELEXA) 40 MG tablet Take 1 tablet by mouth daily.    . ferrous sulfate 325 (65 FE) MG tablet Take 325 mg by mouth daily with breakfast.    . gabapentin (NEURONTIN) 300 MG capsule Take 1 capsule (300 mg total) by mouth at bedtime. 90 capsule 3  . letrozole (FEMARA) 2.5 MG tablet Take 1 tablet (2.5 mg total) by mouth daily. 90 tablet 3  . omeprazole (PRILOSEC) 40 MG capsule Take 1 capsule (40 mg total) by mouth 2 (two) times daily before a meal. 60 capsule 11  . spironolactone (ALDACTONE) 25 MG tablet TAKE 1/2 TABLET BY MOUTH EVERY DAY 30 tablet 4  . valsartan-hydrochlorothiazide (DIOVAN-HCT) 320-25 MG tablet Take 1 tablet by mouth daily.    . Vitamin D, Ergocalciferol, (DRISDOL) 50000 units CAPS capsule Take 50,000 Units by mouth every 7 (seven) days.     Facility-Administered Medications Prior to Visit  Medication Dose Route Frequency Provider Last Rate Last Dose  . sodium chloride 0.9 % injection 10 mL  10 mL Intracatheter PRN Marcy Panning, MD   10 mL at 03/14/12 1628     Allergies:   Patient has no known allergies.   Social History   Social History  . Marital status: Married    Spouse name: N/A  . Number of children: N/A  . Years of education: N/A   Social History Main Topics  . Smoking  status: Never Smoker  . Smokeless tobacco: Never Used  . Alcohol use No  . Drug use: No  . Sexual activity: Yes    Birth control/ protection: Surgical     Comment: mensus age 54, 90st pregnancy 64, no hrt gg4,p3, 1 babay lived a few hours complications   Other Topics Concern  . None   Social History Narrative  . None     Family History:  The patient's family history includes Breast cancer in her other; Prostate cancer in her father.   ROS:   Please see the history of present illness.    ROS All other systems reviewed and are negative.   PHYSICAL EXAM:   VS:  BP 122/62   Pulse 66   Ht 4' 11.5" (1.511 m)   Wt 176 lb (79.8 kg)   BMI 34.95 kg/m    GEN: Well nourished, well developed, in no acute distress  HEENT:  normal  Neck: no JVD, carotid bruits, or masses Cardiac: RRR; 5/6 holosystolic, soft S2, rubs, or gallops,no edema  Respiratory:  clear to auscultation bilaterally, normal work of breathing GI: soft, nontender, nondistended, + BS MS: no deformity or atrophy  Skin: warm and dry, no rash Neuro:  Alert and Oriented x 3, Strength and sensation are intact Psych: euthymic mood, full affect  Wt Readings from Last 3 Encounters:  09/24/16 176 lb (79.8 kg)  09/10/16 170 lb (77.1 kg)  08/26/16 170 lb (77.1 kg)     Studies/Labs Reviewed:   EKG:  EKG is ordered today.  Nursing reviewed, shows normal sinus rhythm with right axis deviation and negative T waves in inferior leads that are more pronounced than on the prior EKG.   Recent Labs: 01/13/2016: ALT 11 06/17/2016: BUN 21; Creatinine, Ser 0.88; Potassium 4.4; Sodium 139 09/10/2016: Hemoglobin 8.9; Platelets 242   Lipid Panel No results found for: CHOL, TRIG, HDL, CHOLHDL, VLDL, LDLCALC, LDLDIRECT  Additional studies/ records that were reviewed today include:   TTE: 06/15/2016  - Left ventricle: The cavity size was normal. There was mild   concentric hypertrophy. Systolic function was vigorous. The   estimated  ejection fraction was in the range of 65% to 70%. Wall   motion was normal; there were no regional wall motion   abnormalities. Doppler parameters are consistent with abnormal   left ventricular relaxation (grade 1 diastolic dysfunction).   Doppler parameters are consistent with elevated ventricular   end-diastolic filling pressure. - Aortic valve: Valve mobility was restricted. There was moderate   stenosis. There was mild regurgitation. Mean gradient (S): 32 mm   Hg. Peak gradient (S): 59 mm Hg. Valve area (VTI): 1.08 cm^2.   Valve area (Vmax): 1.01 cm^2. Valve area (Vmean): 1 cm^2. - Aortic root: The aortic root was normal in size. - Mitral valve: Calcified annulus. Valve area by pressure   half-time: 1.96 cm^2. Valve area by continuity equation (using   LVOT flow): 2 cm^2. - Left atrium: The atrium was at the upper limits of normal in   size. - Right ventricle: Systolic function was normal. - Right atrium: The atrium was normal in size. - Tricuspid valve: There was mild regurgitation. - Pulmonary arteries: Systolic pressure was mildly increased. PA   peak pressure: 32 mm Hg (S). - Inferior vena cava: The vessel was normal in size. The   respirophasic diameter changes were in the normal range (= 50%),   consistent with normal central venous pressure. - Pericardium, extracardiac: There was no pericardial effusion.  Impressions: - When compared to the prior study from 04/19/2013, aortic stenosis   is now moderate, there is mild aortic regurgitation.    ASSESSMENT:    1. Aortic stenosis, moderate   2. Other hyperlipidemia   3. DOE (dyspnea on exertion)   4. Aortic valve stenosis, etiology of cardiac valve disease unspecified   5. Essential hypertension   6. Abnormal ECG      PLAN:  In order of problems listed above:  1. Moderate aortic stenosis with significant symptoms, very soft S2, we will repeat an echocardiogram to reevaluate. If still in the moderate range we will  perform a stress test to evaluate possible ischemia as her EKG has new changes in the inferior leads. For now I'm holding stress test because if her aortic stenosis is now severe she would have to undergo cardiac catheterization as part of the preop evaluation. If aortic stenosis severe we will refer for  TAVR evaluation. This is explained to patient in detail she seemed to understand and agrees. 2. Abnormal EKG suggestive of ischemia - as above 3. Hypertension - well controlled 4. Hyperlipidemia - on atorvastatin 40 mg daily we will check labs today.  Labs today CBC, CMP, TSH, lipids.   Medication Adjustments/Labs and Tests Ordered: Current medicines are reviewed at length with the patient today.  Concerns regarding medicines are outlined above.  Medication changes, Labs and Tests ordered today are listed in the Patient Instructions below. Patient Instructions  Medication Instructions:   Your physician recommends that you continue on your current medications as directed. Please refer to the Current Medication list given to you today.    Testing/Procedures:  Your physician has requested that you have an echocardiogram. Echocardiography is a painless test that uses sound waves to create images of your heart. It provides your doctor with information about the size and shape of your heart and how well your heart's chambers and valves are working. This procedure takes approximately one hour. There are no restrictions for this procedure.     Follow-Up:  3 MONTHS WITH DR Meda Coffee       If you need a refill on your cardiac medications before your next appointment, please call your pharmacy.      Signed, Ena Dawley, MD  09/24/2016 11:22 AM    [facility] [place], [place],   [address] Phone: [phone]; Fax: [phone]

## 2016-10-05 ENCOUNTER — Other Ambulatory Visit: Payer: Self-pay

## 2016-10-05 ENCOUNTER — Ambulatory Visit (HOSPITAL_COMMUNITY): Payer: Medicare HMO | Attending: Cardiovascular Disease

## 2016-10-05 DIAGNOSIS — E784 Other hyperlipidemia: Secondary | ICD-10-CM | POA: Diagnosis not present

## 2016-10-05 DIAGNOSIS — I350 Nonrheumatic aortic (valve) stenosis: Secondary | ICD-10-CM

## 2016-10-05 DIAGNOSIS — Z853 Personal history of malignant neoplasm of breast: Secondary | ICD-10-CM | POA: Insufficient documentation

## 2016-10-05 DIAGNOSIS — Z9221 Personal history of antineoplastic chemotherapy: Secondary | ICD-10-CM | POA: Insufficient documentation

## 2016-10-05 DIAGNOSIS — Z923 Personal history of irradiation: Secondary | ICD-10-CM | POA: Insufficient documentation

## 2016-10-05 DIAGNOSIS — E7849 Other hyperlipidemia: Secondary | ICD-10-CM

## 2016-10-05 DIAGNOSIS — Z9012 Acquired absence of left breast and nipple: Secondary | ICD-10-CM | POA: Diagnosis not present

## 2016-10-05 DIAGNOSIS — R0609 Other forms of dyspnea: Secondary | ICD-10-CM | POA: Diagnosis not present

## 2016-10-05 DIAGNOSIS — R0600 Dyspnea, unspecified: Secondary | ICD-10-CM

## 2016-10-13 DIAGNOSIS — M545 Low back pain: Secondary | ICD-10-CM | POA: Diagnosis not present

## 2016-10-13 DIAGNOSIS — M130 Polyarthritis, unspecified: Secondary | ICD-10-CM | POA: Diagnosis not present

## 2016-10-13 DIAGNOSIS — M47816 Spondylosis without myelopathy or radiculopathy, lumbar region: Secondary | ICD-10-CM | POA: Diagnosis not present

## 2016-11-13 ENCOUNTER — Telehealth: Payer: Self-pay | Admitting: Gastroenterology

## 2016-11-13 MED ORDER — ONDANSETRON HCL 4 MG PO TABS: 4.0000 mg | ORAL_TABLET | Freq: Two times a day (BID) | ORAL | 2 refills | Status: DC

## 2016-11-13 NOTE — Telephone Encounter (Signed)
zofran 4mg  pills, take  One pill twice daily as needed for nausea.  Disp 50 with 2 refills.

## 2016-11-13 NOTE — Telephone Encounter (Signed)
Pt states she is not taking any NSAIDs and she Is taking her omeprazole BID 43min before meals. States her stomach "just hurts all the time." Reports she is nauseated and has been vomiting all week. She is calling requesting something for nausea. Please advise.

## 2016-11-13 NOTE — Telephone Encounter (Signed)
Patty, can you call her and ask her about NSAID use, see if she has actually stopped since her last EGD 2 months ago, also ask about PPI, should be on it twice daily.

## 2016-11-13 NOTE — Telephone Encounter (Signed)
Script sent to pharmacy for pt. Pt aware.

## 2016-11-15 ENCOUNTER — Inpatient Hospital Stay (HOSPITAL_COMMUNITY)
Admission: EM | Admit: 2016-11-15 | Discharge: 2016-11-25 | DRG: 329 | Disposition: A | Discharge status: Skilled Nursing Facility | Payer: Medicare HMO | Attending: Internal Medicine | Admitting: Internal Medicine

## 2016-11-15 ENCOUNTER — Emergency Department (HOSPITAL_COMMUNITY): Payer: Medicare HMO

## 2016-11-15 ENCOUNTER — Encounter (HOSPITAL_COMMUNITY): Payer: Self-pay | Admitting: Emergency Medicine

## 2016-11-15 DIAGNOSIS — Z923 Personal history of irradiation: Secondary | ICD-10-CM | POA: Diagnosis not present

## 2016-11-15 DIAGNOSIS — J45909 Unspecified asthma, uncomplicated: Secondary | ICD-10-CM | POA: Diagnosis present

## 2016-11-15 DIAGNOSIS — G9341 Metabolic encephalopathy: Secondary | ICD-10-CM | POA: Diagnosis present

## 2016-11-15 DIAGNOSIS — E785 Hyperlipidemia, unspecified: Secondary | ICD-10-CM | POA: Diagnosis present

## 2016-11-15 DIAGNOSIS — Z79899 Other long term (current) drug therapy: Secondary | ICD-10-CM | POA: Diagnosis not present

## 2016-11-15 DIAGNOSIS — R0602 Shortness of breath: Secondary | ICD-10-CM

## 2016-11-15 DIAGNOSIS — K8050 Calculus of bile duct without cholangitis or cholecystitis without obstruction: Secondary | ICD-10-CM | POA: Diagnosis not present

## 2016-11-15 DIAGNOSIS — Z96612 Presence of left artificial shoulder joint: Secondary | ICD-10-CM | POA: Diagnosis present

## 2016-11-15 DIAGNOSIS — K269 Duodenal ulcer, unspecified as acute or chronic, without hemorrhage or perforation: Secondary | ICD-10-CM | POA: Diagnosis not present

## 2016-11-15 DIAGNOSIS — K460 Unspecified abdominal hernia with obstruction, without gangrene: Secondary | ICD-10-CM | POA: Diagnosis not present

## 2016-11-15 DIAGNOSIS — J95821 Acute postprocedural respiratory failure: Secondary | ICD-10-CM | POA: Diagnosis not present

## 2016-11-15 DIAGNOSIS — G4700 Insomnia, unspecified: Secondary | ICD-10-CM | POA: Diagnosis present

## 2016-11-15 DIAGNOSIS — Z66 Do not resuscitate: Secondary | ICD-10-CM | POA: Diagnosis present

## 2016-11-15 DIAGNOSIS — R1310 Dysphagia, unspecified: Secondary | ICD-10-CM | POA: Diagnosis present

## 2016-11-15 DIAGNOSIS — R1084 Generalized abdominal pain: Secondary | ICD-10-CM | POA: Diagnosis not present

## 2016-11-15 DIAGNOSIS — D649 Anemia, unspecified: Secondary | ICD-10-CM | POA: Diagnosis present

## 2016-11-15 DIAGNOSIS — Z452 Encounter for adjustment and management of vascular access device: Secondary | ICD-10-CM

## 2016-11-15 DIAGNOSIS — K219 Gastro-esophageal reflux disease without esophagitis: Secondary | ICD-10-CM | POA: Diagnosis present

## 2016-11-15 DIAGNOSIS — F32A Depression, unspecified: Secondary | ICD-10-CM | POA: Diagnosis present

## 2016-11-15 DIAGNOSIS — R739 Hyperglycemia, unspecified: Secondary | ICD-10-CM | POA: Diagnosis present

## 2016-11-15 DIAGNOSIS — K436 Other and unspecified ventral hernia with obstruction, without gangrene: Secondary | ICD-10-CM | POA: Diagnosis not present

## 2016-11-15 DIAGNOSIS — E669 Obesity, unspecified: Secondary | ICD-10-CM | POA: Diagnosis present

## 2016-11-15 DIAGNOSIS — Z9012 Acquired absence of left breast and nipple: Secondary | ICD-10-CM | POA: Diagnosis not present

## 2016-11-15 DIAGNOSIS — Z6832 Body mass index (BMI) 32.0-32.9, adult: Secondary | ICD-10-CM | POA: Diagnosis not present

## 2016-11-15 DIAGNOSIS — Z853 Personal history of malignant neoplasm of breast: Secondary | ICD-10-CM

## 2016-11-15 DIAGNOSIS — R112 Nausea with vomiting, unspecified: Secondary | ICD-10-CM | POA: Diagnosis not present

## 2016-11-15 DIAGNOSIS — F329 Major depressive disorder, single episode, unspecified: Secondary | ICD-10-CM | POA: Diagnosis present

## 2016-11-15 DIAGNOSIS — K5669 Other partial intestinal obstruction: Secondary | ICD-10-CM | POA: Diagnosis not present

## 2016-11-15 DIAGNOSIS — K430 Incisional hernia with obstruction, without gangrene: Principal | ICD-10-CM | POA: Diagnosis present

## 2016-11-15 DIAGNOSIS — K439 Ventral hernia without obstruction or gangrene: Secondary | ICD-10-CM | POA: Diagnosis not present

## 2016-11-15 DIAGNOSIS — E43 Unspecified severe protein-calorie malnutrition: Secondary | ICD-10-CM | POA: Diagnosis not present

## 2016-11-15 DIAGNOSIS — I10 Essential (primary) hypertension: Secondary | ICD-10-CM | POA: Diagnosis not present

## 2016-11-15 DIAGNOSIS — J9690 Respiratory failure, unspecified, unspecified whether with hypoxia or hypercapnia: Secondary | ICD-10-CM | POA: Diagnosis not present

## 2016-11-15 DIAGNOSIS — E876 Hypokalemia: Secondary | ICD-10-CM | POA: Diagnosis not present

## 2016-11-15 DIAGNOSIS — K56609 Unspecified intestinal obstruction, unspecified as to partial versus complete obstruction: Secondary | ICD-10-CM | POA: Diagnosis not present

## 2016-11-15 DIAGNOSIS — R0609 Other forms of dyspnea: Secondary | ICD-10-CM | POA: Diagnosis not present

## 2016-11-15 DIAGNOSIS — R69 Illness, unspecified: Secondary | ICD-10-CM | POA: Diagnosis not present

## 2016-11-15 DIAGNOSIS — N179 Acute kidney failure, unspecified: Secondary | ICD-10-CM | POA: Diagnosis not present

## 2016-11-15 DIAGNOSIS — I350 Nonrheumatic aortic (valve) stenosis: Secondary | ICD-10-CM | POA: Diagnosis not present

## 2016-11-15 DIAGNOSIS — E860 Dehydration: Secondary | ICD-10-CM | POA: Diagnosis present

## 2016-11-15 DIAGNOSIS — K5649 Other impaction of intestine: Secondary | ICD-10-CM | POA: Diagnosis not present

## 2016-11-15 DIAGNOSIS — K658 Other peritonitis: Secondary | ICD-10-CM | POA: Diagnosis not present

## 2016-11-15 DIAGNOSIS — J189 Pneumonia, unspecified organism: Secondary | ICD-10-CM | POA: Diagnosis not present

## 2016-11-15 DIAGNOSIS — J9600 Acute respiratory failure, unspecified whether with hypoxia or hypercapnia: Secondary | ICD-10-CM

## 2016-11-15 DIAGNOSIS — K659 Peritonitis, unspecified: Secondary | ICD-10-CM | POA: Diagnosis not present

## 2016-11-15 DIAGNOSIS — Z4659 Encounter for fitting and adjustment of other gastrointestinal appliance and device: Secondary | ICD-10-CM

## 2016-11-15 DIAGNOSIS — R109 Unspecified abdominal pain: Secondary | ICD-10-CM | POA: Diagnosis not present

## 2016-11-15 DIAGNOSIS — Z01810 Encounter for preprocedural cardiovascular examination: Secondary | ICD-10-CM

## 2016-11-15 DIAGNOSIS — S31109A Unspecified open wound of abdominal wall, unspecified quadrant without penetration into peritoneal cavity, initial encounter: Secondary | ICD-10-CM | POA: Diagnosis not present

## 2016-11-15 DIAGNOSIS — K660 Peritoneal adhesions (postprocedural) (postinfection): Secondary | ICD-10-CM | POA: Diagnosis present

## 2016-11-15 DIAGNOSIS — Z9889 Other specified postprocedural states: Secondary | ICD-10-CM

## 2016-11-15 DIAGNOSIS — Z4682 Encounter for fitting and adjustment of non-vascular catheter: Secondary | ICD-10-CM | POA: Diagnosis not present

## 2016-11-15 DIAGNOSIS — N200 Calculus of kidney: Secondary | ICD-10-CM | POA: Diagnosis not present

## 2016-11-15 DIAGNOSIS — S3092XA Unspecified superficial injury of abdominal wall, initial encounter: Secondary | ICD-10-CM | POA: Diagnosis not present

## 2016-11-15 DIAGNOSIS — Z9911 Dependence on respirator [ventilator] status: Secondary | ICD-10-CM | POA: Diagnosis not present

## 2016-11-15 DIAGNOSIS — K565 Intestinal adhesions [bands], unspecified as to partial versus complete obstruction: Secondary | ICD-10-CM | POA: Diagnosis not present

## 2016-11-15 LAB — CBC
HCT: 39.6 % (ref 36.0–46.0)
HEMOGLOBIN: 13.2 g/dL (ref 12.0–15.0)
MCH: 29.1 pg (ref 26.0–34.0)
MCHC: 33.3 g/dL (ref 30.0–36.0)
MCV: 87.4 fL (ref 78.0–100.0)
Platelets: 368 10*3/uL (ref 150–400)
RBC: 4.53 MIL/uL (ref 3.87–5.11)
RDW: 16.6 % — ABNORMAL HIGH (ref 11.5–15.5)
WBC: 16.5 10*3/uL — AB (ref 4.0–10.5)

## 2016-11-15 LAB — COMPREHENSIVE METABOLIC PANEL
ALBUMIN: 3.9 g/dL (ref 3.5–5.0)
ALT: 26 U/L (ref 14–54)
ANION GAP: 16 — AB (ref 5–15)
AST: 27 U/L (ref 15–41)
Alkaline Phosphatase: 64 U/L (ref 38–126)
BILIRUBIN TOTAL: 0.8 mg/dL (ref 0.3–1.2)
BUN: 60 mg/dL — ABNORMAL HIGH (ref 6–20)
CALCIUM: 9.4 mg/dL (ref 8.9–10.3)
CO2: 20 mmol/L — ABNORMAL LOW (ref 22–32)
Chloride: 100 mmol/L — ABNORMAL LOW (ref 101–111)
Creatinine, Ser: 2.5 mg/dL — ABNORMAL HIGH (ref 0.44–1.00)
GFR, EST AFRICAN AMERICAN: 20 mL/min — AB (ref >60)
GFR, EST NON AFRICAN AMERICAN: 17 mL/min — AB (ref >60)
Glucose, Bld: 145 mg/dL — ABNORMAL HIGH (ref 65–99)
Potassium: 4.6 mmol/L (ref 3.5–5.1)
SODIUM: 136 mmol/L (ref 135–145)
TOTAL PROTEIN: 7.8 g/dL (ref 6.5–8.1)

## 2016-11-15 LAB — URINALYSIS, ROUTINE W REFLEX MICROSCOPIC
Glucose, UA: NEGATIVE mg/dL
Hgb urine dipstick: NEGATIVE
Ketones, ur: NEGATIVE mg/dL
LEUKOCYTES UA: NEGATIVE
NITRITE: NEGATIVE
Protein, ur: NEGATIVE mg/dL
SPECIFIC GRAVITY, URINE: 1.017 (ref 1.005–1.030)
pH: 5 (ref 5.0–8.0)

## 2016-11-15 LAB — LIPASE, BLOOD: Lipase: 24 U/L (ref 11–51)

## 2016-11-15 LAB — I-STAT CG4 LACTIC ACID, ED: LACTIC ACID, VENOUS: 1.72 mmol/L (ref 0.5–1.9)

## 2016-11-15 MED ORDER — ONDANSETRON 4 MG PO TBDP
4.0000 mg | ORAL_TABLET | Freq: Once | ORAL | Status: AC | PRN
  Administered 2016-11-15: 4 mg via ORAL
  Filled 2016-11-15: qty 1

## 2016-11-15 MED ORDER — METOCLOPRAMIDE HCL 5 MG/ML IJ SOLN: 10.0000 mg | Freq: Once | INTRAMUSCULAR | Status: DC

## 2016-11-15 MED ORDER — MORPHINE SULFATE (PF) 4 MG/ML IV SOLN
4.0000 mg | INTRAVENOUS | Status: DC | PRN
Start: 2016-11-15 — End: 2016-11-15
  Administered 2016-11-15: 4 mg via INTRAVENOUS
  Filled 2016-11-15: qty 1

## 2016-11-15 MED ORDER — MORPHINE SULFATE (PF) 4 MG/ML IV SOLN
2.0000 mg | INTRAVENOUS | Status: DC | PRN
  Administered 2016-11-16 (×2): 2 mg via INTRAVENOUS
  Filled 2016-11-15 (×2): qty 1

## 2016-11-15 MED ORDER — SODIUM CHLORIDE 0.9 % IV BOLUS (SEPSIS)
1000.0000 mL | Freq: Once | INTRAVENOUS | Status: AC
  Administered 2016-11-15: 1000 mL via INTRAVENOUS

## 2016-11-15 MED ORDER — SODIUM CHLORIDE 0.9 % IV SOLN
Freq: Once | INTRAVENOUS | Status: DC
Start: 2016-11-15 — End: 2016-11-16

## 2016-11-15 MED ORDER — ONDANSETRON HCL 4 MG/2ML IJ SOLN
4.0000 mg | Freq: Once | INTRAMUSCULAR | Status: AC
  Administered 2016-11-15: 4 mg via INTRAVENOUS
  Filled 2016-11-15: qty 2

## 2016-11-15 NOTE — H&P (Signed)
History and Physical    Brandy [name] SJG:[id number] DOB: [birth year] DOA: 11/15/2016  PCP: Kathyrn Lass, MD Consultants:  Ardis Hughs - GI Patient coming from: Home - lives with husband; NOK: Daughter, [phone]  Chief Complaint: abdominal pain, n/v  HPI: KARN DERK is a 83 y.o. female with medical history significant of HTN, HLD, remote breast cancer, GERD, and gallstones without h/o cholecystectomy presenting with n/v which began last Tuesday night.  She has been vomiting ever since.  Called Dr. Ardis Hughs, who called in some nausea pills which helped a bit.  Vomited about 2-3 times daily.  Pain in the area around her ventral hernia since Tuesday.  Pain is better following hernia reduction in the ER.  Last BM was Wednesday or Thursday and was normal; it is unusual for her to go so long between BMs.  No fevers.   ED Course: acute renal failure and leukocytosis on labs.  CT with incarcerated small bowel in left ventral hernia which was reduced successfully.  IVF, pain medication, antiemetics given.  Dr. Hassell Done will consult on the patient.  Review of Systems: As per HPI; otherwise review of systems reviewed and negative.   Ambulatory Status:  Ambulates with a cane  Past Medical History:  Diagnosis Date  . Anemia   . Arthritis    shoulders  . Asthma    mild, no inhalers used  . Breast cancer ([place]) 12/18/11   left breast masectomy=metastatic ca in (1/1) lymph node ,invasive ductal ca,2 foci,,dcis,lymph ovascular invasion identified,surgical resection margins neg for ca,additional tissue=benign skin and subcutaneous tissue  . Bronchitis    hx of;last time >57yr ago  . Depression    takes Celexa daily  . Difficult intubation   . Dysphagia   . Full dentures   . Gall stones 2015  . GERD (gastroesophageal reflux disease)    takes Prilosec prn  . H/O hiatal hernia   . History of kidney stones    many yrs ago  . Hx of radiation therapy 04/26/12 -06/10/12   left breast  . Hyperlipidemia     takes Simvastatin daily  . Hypertension    takes Amlodipine and Diovan daily  . Insomnia    takes Ambien nightly  . Urinary urgency     Past Surgical History:  Procedure Laterality Date  . APPENDECTOMY    . bladder tack    . BREAST BIOPSY  1998   left   . DILATION AND CURETTAGE OF UTERUS    . ENDOSCOPIC RETROGRADE CHOLANGIOPANCREATOGRAPHY (ERCP) WITH PROPOFOL N/A 02/01/2014   Procedure: ENDOSCOPIC RETROGRADE CHOLANGIOPANCREATOGRAPHY (ERCP) WITH PROPOFOL;  Surgeon: [name], MD;  Location: [facility];  Service: Endoscopy;  Laterality: N/A;  . ESOPHAGOGASTRODUODENOSCOPY    . ESOPHAGOGASTRODUODENOSCOPY N/A 06/26/2016   Procedure: ESOPHAGOGASTRODUODENOSCOPY (EGD);  Surgeon: [name], MD;  Location: [facility];  Service: Endoscopy;  Laterality: N/A;  . ESOPHAGOGASTRODUODENOSCOPY (EGD) WITH PROPOFOL  12/19/2013   Procedure: ESOPHAGOGASTRODUODENOSCOPY (EGD) WITH PROPOFOL;  Surgeon: Beryle Beams, MD;  Location: [facility];  Service: Endoscopy;;  . ESOPHAGOGASTRODUODENOSCOPY (EGD) WITH PROPOFOL N/A 09/10/2016   Procedure: ESOPHAGOGASTRODUODENOSCOPY (EGD) WITH PROPOFOL;  Surgeon: [name], MD;  Location: [facility];  Service: Endoscopy;  Laterality: N/A;  . EUS  06/04/2011   Procedure: UPPER ENDOSCOPIC ULTRASOUND (EUS) LINEAR;  Surgeon: Owens Loffler, MD;  Location: [facility];  Service: Endoscopy;  Laterality: N/A;  . EUS N/A 02/01/2014   Procedure: UPPER ENDOSCOPIC ULTRASOUND (EUS) LINEAR;  Surgeon: [name], MD;  Location:  [facility];  Service: Endoscopy;  Laterality: N/A;  . EXPLORATORY LAPAROTOMY      biopsy of intra-abdominal mass  . MASTECTOMY W/ SENTINEL NODE BIOPSY  12/18/2011   Procedure: MASTECTOMY WITH SENTINEL LYMPH NODE BIOPSY;  Surgeon: Rolm Bookbinder, MD;  Location: [facility];  Service: General;  Laterality: Left;  . PORT-A-CATH REMOVAL Right 06/15/2013   Procedure: REMOVAL PORT-A-CATH;  Surgeon: Rolm Bookbinder, MD;  Location: [place];  Service: General;  Laterality: Right;  . PORTACATH PLACEMENT  01/27/2012   Procedure: INSERTION PORT-A-CATH;  Surgeon: Rolm Bookbinder, MD;  Location: [employer];  Service: General;  Laterality: Right;  PORT PLACEMENT  . TOTAL SHOULDER REPLACEMENT  2011   left  . TUBAL LIGATION      Social History   Social History  . Marital status: Married    Spouse name: N/A  . Number of children: N/A  . Years of education: N/A   Occupational History  . retired    Social History Main Topics  . Smoking status: Never Smoker  . Smokeless tobacco: Never Used  . Alcohol use No  . Drug use: No  . Sexual activity: Yes    Birth control/ protection: Surgical     Comment: mensus age 72, 7st pregnancy 2, no hrt gg4,p3, 1 babay lived a few hours complications   Other Topics Concern  . Not on file   Social History Narrative  . No narrative on file    No Known Allergies  Family History  Problem Relation Age of Onset  . Prostate cancer Father   . Breast cancer Other     Prior to Admission medications   Medication Sig Start Date End Date Taking? Authorizing Provider  acetaminophen (TYLENOL) 500 MG tablet Take 1,000 mg by mouth every 6 (six) hours as needed (pain).   Yes [provider]  amLODipine (NORVASC) 10 MG tablet Take 10 mg by mouth every morning.  02/13/11  Yes [provider]  atorvastatin (LIPITOR) 40 MG tablet Take 1 tablet by mouth daily. 04/27/16  Yes [provider]  citalopram (CELEXA) 40 MG tablet Take 1 tablet by mouth daily. 06/01/16  Yes [provider]  ferrous sulfate 325 (65 FE) MG tablet Take 325 mg by mouth daily with breakfast.   Yes [provider]  gabapentin (NEURONTIN) 300 MG capsule Take 1 capsule (300 mg total) by mouth at bedtime. 09/01/16  Yes Nicholas Lose, MD  letrozole [facility]) 2.5 MG tablet Take 1 tablet (2.5 mg total) by mouth daily. 11/25/15  Yes Nicholas Lose, MD  omeprazole  (PRILOSEC) 40 MG capsule Take 1 capsule (40 mg total) by mouth 2 (two) times daily before a meal. 06/26/16  Yes [name], MD  ondansetron (ZOFRAN) 4 MG tablet Take 1 tablet (4 mg total) by mouth 2 (two) times daily. 11/13/16  Yes [name], MD  spironolactone (ALDACTONE) 25 MG tablet TAKE 1/2 TABLET BY MOUTH EVERY DAY 08/05/16  Yes [name], [name] D, NP  valsartan-hydrochlorothiazide (DIOVAN-HCT) 320-25 MG tablet Take 1 tablet by mouth daily. 05/25/16  Yes [provider]  Vitamin D, Ergocalciferol, (DRISDOL) 50000 units CAPS capsule Take 50,000 Units by mouth every 7 (seven) days.   Yes [provider]    Physical Exam: Vitals:   11/15/16 1884 11/15/16 2113 11/15/16 2323  BP: 95/63 90/77 (!) 109/58  Pulse: (!) 111 94 84  Resp: 18 18 16   Temp: 97.9 F (36.6 C)    TempSrc: Oral  SpO2: 92% 92% 92%     General:  Appears calm and comfortable and is NAD Eyes:  PERRL, EOMI, normal lids, iris ENT:  grossly normal hearing, lips & tongue, mmm Neck:  no LAD, masses or thyromegaly Cardiovascular:  RRR, 2-0/1 systolic murmur, no r/g. No LE edema.  Respiratory:  CTA bilaterally, no w/r/r. Normal respiratory effort. Abdomen:  Large ventral hernia with continued tightness and TTP but no apparent strangulation, decreased bowel sounds Skin:  no rash or induration seen on limited exam Musculoskeletal:  grossly normal tone BUE/BLE, good ROM, no bony abnormality Psychiatric:  grossly normal mood and affect, speech fluent and appropriate, AOx3 Neurologic:  CN 2-12 grossly intact, moves all extremities in coordinated fashion, sensation intact  Labs on Admission: I have personally reviewed following labs and imaging studies  CBC:  Recent Labs Lab 11/15/16 1850  WBC 16.5*  HGB 13.2  HCT 39.6  MCV 87.4  PLT 007   Basic Metabolic Panel:  Recent Labs Lab 11/15/16 1850  NA 136  K 4.6  CL 100*  CO2 20*  GLUCOSE 145*  BUN 60*  CREATININE 2.50*  CALCIUM 9.4    GFR: CrCl cannot be calculated (Unknown ideal weight.). Liver Function Tests:  Recent Labs Lab 11/15/16 1850  AST 27  ALT 26  ALKPHOS 64  BILITOT 0.8  PROT 7.8  ALBUMIN 3.9    Recent Labs Lab 11/15/16 1850  LIPASE 24   No results for input(s): AMMONIA in the last 168 hours. Coagulation Profile: No results for input(s): INR, PROTIME in the last 168 hours. Cardiac Enzymes: No results for input(s): CKTOTAL, CKMB, CKMBINDEX, TROPONINI in the last 168 hours. BNP (last 3 results) No results for input(s): PROBNP in the last 8760 hours. HbA1C: No results for input(s): HGBA1C in the last 72 hours. CBG: No results for input(s): GLUCAP in the last 168 hours. Lipid Profile: No results for input(s): CHOL, HDL, LDLCALC, TRIG, CHOLHDL, LDLDIRECT in the last 72 hours. Thyroid Function Tests: No results for input(s): TSH, T4TOTAL, FREET4, T3FREE, THYROIDAB in the last 72 hours. Anemia Panel: No results for input(s): VITAMINB12, FOLATE, FERRITIN, TIBC, IRON, RETICCTPCT in the last 72 hours. Urine analysis:    Component Value Date/Time   COLORURINE AMBER (A) 11/15/2016 2253   APPEARANCEUR CLOUDY (A) 11/15/2016 2253   LABSPEC 1.017 11/15/2016 2253   PHURINE 5.0 11/15/2016 2253   GLUCOSEU NEGATIVE 11/15/2016 2253   HGBUR NEGATIVE 11/15/2016 2253   BILIRUBINUR SMALL (A) 11/15/2016 2253   KETONESUR NEGATIVE 11/15/2016 2253   PROTEINUR NEGATIVE 11/15/2016 2253   UROBILINOGEN 1.0 10/10/2009 1100   NITRITE NEGATIVE 11/15/2016 2253   LEUKOCYTESUR NEGATIVE 11/15/2016 2253    Creatinine Clearance: CrCl cannot be calculated (Unknown ideal weight.).  Sepsis Labs: @LABRCNTIP (procalcitonin:4,lacticidven:4) )No results found for this or any previous visit (from the past 240 hour(s)).   Radiological Exams on Admission: Ct Abdomen Pelvis Wo Contrast  Result Date: 11/15/2016 CLINICAL DATA:  Generalized abdominal pain and emesis x2 weeks. EXAM: CT ABDOMEN AND PELVIS WITHOUT CONTRAST  TECHNIQUE: Multidetector CT imaging of the abdomen and pelvis was performed following the standard protocol without IV contrast. COMPARISON:  12/17/2013 FINDINGS: Lower chest: Cardiomegaly with aortic atherosclerosis, mitral annular calcifications and coronary arteriosclerosis. No pericardial effusion. There is atelectasis at the lung bases. Stable moderate-sized hiatal hernia. Hepatobiliary: Punctate calcification in the right hepatic dome consistent with the tiny granuloma or vascular calcification. No space-occupying mass of the liver noted. Gallbladder is nondistended. No gallstones are seen. No biliary dilatation is  identified. Pancreas: Unremarkable. No pancreatic ductal dilatation or surrounding inflammatory changes.Normal maxilla normal Spleen: Normal in size without focal abnormality. Adrenals/Urinary Tract: Normal bilateral adrenal glands. Stable cortical and parapelvic cysts noted of both kidneys. Hyperdense exophytic lesion off the upper pole the right kidney is stable likely to represent a proteinaceous or hemorrhagic cyst, also stable in size in consistent with a benign finding given stability since 2015. No obstructive uropathy is identified. There are punctate calcifications within both kidneys which may be secondary to vascular calcifications or nephrolithiasis. Stomach/Bowel: Moderate fluid-filled distention of the stomach. Dilated fluid-filled small bowel is seen starting from the second portion of the duodenum leading up to a transition point within a left periumbilical ventral hernia, series 2, image 60. There is one right-sided periumbilical ventral hernia containing nondistended loops of ileum, a fat containing umbilical hernia, and two left-sided periumbilical ventral hernias, the larger of which demonstrates the transition point. The more medial of the left-sided hernias contains omental fat, nondistended ileum and fluid-filled distended jejunum up to 4.1 cm. A short segment of incarcerated  jejunum is not entirely excluded. The more lateral left-sided hernia contains omental fat and nondistended ileum. Vascular/Lymphatic: Aortic atherosclerosis. No enlarged abdominal or pelvic lymph nodes. Reproductive: Atrophic appearance of the uterus consistent with patient's age. No adnexal mass. Other: No abdominopelvic ascites. Musculoskeletal: Thoracolumbar spondylosis. No acute nor suspicious osseous findings. IMPRESSION: 1. Three ventral, periumbilical hernias are identified containing small bowel, one to the right of the umbilicus and two to the left. The more medial of the left-sided ventral hernias demonstrates a transition point contributing to a high-grade small bowel obstruction. A short segment of fluid-filled dilated small bowel is seen measuring up to 4.1 cm in caliber within the medial left-sided hernia and incarceration is not entirely excluded. Correlate for reducibility. 2. Stable simple and complex cysts as well as punctate bilateral nephrolithiasis of the kidneys. 3. Stable cardiomegaly with mitral annular calcifications and coronary arteriosclerosis. Moderate-sized hiatal hernia. Electronically Signed   By: Ashley Royalty M.D.   On: 11/15/2016 21:19    EKG: not done  Assessment/Plan Principal Problem:   Incarcerated hernia Active Problems:   Depression   Gastroesophageal reflux disease without esophagitis   Hyperlipidemia   Essential hypertension   Acute renal failure (ARF) (HCC)   Hyperglycemia   Incarcerated hernia -Patient with large ventral hernia with incarceration seen on CT -Reduction performed by Dr. Jeneen Rinks in the ER with success -At the time of my exam, the patient reported some improvement in pain/nausea but ongoing symptoms -Pain control with morphine, nausea control with Zofran -Patient declines NG tube at this time -Dr. Hassell Done from surgery has been consulted and is planning to see the patient -It appears that the patient will require surgical repair so will admit  to Med Surg at this time -WBC 16.5 - appears reactive -No plan for antibiotics at this time  Acute renal failure -Patient reports h/o intermittent n/v with decreased PO since Tuesday -BUN 60/Creatinine 2.50/GFR 17; 21/0.88 on 06/17/16 -Very likely prerenal azotemia -Will gently rehydrate at 125/hr and continue to follow creatinine -Avoid nephrotoxic agents  Depression -Holding PO medications at this time -She is likely to need Celexa added back post-operatively or sooner if surgery is to be delayed  HTN -Borderline low BP on admission, which is likely related to volume deficiency -Hold BP medications  Hyperglycemia -Glucose 145 -May be stress response -Will follow with fasting AM labs -It is unlikely that she will need acute or chronic treatment for  this issue  Other medical problems are stable, but will hold all PO medications at this time.   DVT prophylaxis: SCDs Code Status:  DNR - confirmed with patient/family Family Communication: Daughter present throughout evaluation Disposition Plan:  Home once clinically improved Consults called: Surgery  Admission status: Admit - It is my clinical opinion that admission to INPATIENT is reasonable and necessary because this patient will require at least 2 midnights in the hospital to treat this condition based on the medical complexity of the problems presented.  Given the aforementioned information, the predictability of an adverse outcome is felt to be significant.    Karmen Bongo MD [facility]  If 7PM-7AM, please contact night-coverage [email or web address] Password [email or web address]  11/15/2016, 11:30 PM

## 2016-11-15 NOTE — ED Provider Notes (Addendum)
[facility] DEPT Provider Note   CSN: [id number] Arrival date & time: 11/15/16  1800     History   Chief Complaint Chief Complaint  Patient presents with  . Emesis  . Abdominal Pain    HPI Brandy [name] is a 83 y.o. female. Chief complaint is abdominal pain, and vomiting for 5 days.  HPI:  Patient presents here with her daughter. Patient lives independently. She has had nausea and abdominal pain since Tuesday. Pain is not localized is diffuse. Vomiting. Nonbilious. No blood. Daughter states that patient has a history of exploratory abdominal surgery in 2012. She states this was because of a large mass in the patient's epigastrium found on imaging. Daughter states that she had expiratory surgery, a negative biopsy, it "went away". No specific testing per daughter. Following in 2013 was diagnosed with breast cancer. She has had mastectomies and completed treatment for this.  Pain started on Tuesday. Started simultaneously with nausea and vomiting has had at least 2 episodes of vomiting per day. Has not had a bowel movement since yesterday. No diarrhea. No blood. History of previous ulcer and has had several upper endoscopies. No blood in her emesis.  Past Medical History:  Diagnosis Date  . Anemia   . Arthritis    shoulders  . Asthma    mild, no inhalers used  . Breast CA ([place]) 11/23/11   left breast 3 o'clock bx=high grade ductal ca in situ w/comedononecrosis and calcification,dcis,lymphovascular invasion present ,ER?PR+positive  . Breast cancer ([place]) 12/18/11   left breast masectomy=metastatic ca in (1/1) lymph node ,invasive ductal ca,2 foci,,dcis,lymph ovascular invasion identified,surgical resection margins neg for ca,additional tissue=benign skin and subcutaneous tissue  . Bronchitis    hx of;last time >61yr ago  . Cancer ([place])    left breast  . Depression    takes Celexa daily  . Difficult intubation   . Dysphagia   . Full dentures   . Gall stones 2015  . GERD  (gastroesophageal reflux disease)    takes Prilosec prn  . H/O hiatal hernia   . History of kidney stones    many yrs ago  . Hx of radiation therapy 04/26/12 -06/10/12   left breast  . Hyperlipidemia    takes Simvastatin daily  . Hypertension    takes Amlodipine and Diovan daily  . Insomnia    takes Ambien nightly  . Urinary urgency     Patient Active Problem List   Diagnosis Date Noted  . Asthma without status asthmaticus 09/24/2016  . Carpal tunnel syndrome 09/24/2016  . Asthma 09/24/2016  . Dark stools 09/24/2016  . Degenerative arthritis of lumbar spine 09/24/2016  . DJD (degenerative joint disease), cervical 09/24/2016  . DOE (dyspnea on exertion) 09/24/2016  . Generalized anxiety disorder 09/24/2016  . Lumbosacral spondylosis without myelopathy 09/24/2016  . Morbid (severe) obesity due to excess calories ([place]) 09/24/2016  . Pernicious anemia 09/24/2016  . Pure hypercholesterolemia 09/24/2016  . Right knee pain 09/24/2016  . Sleep disorder 09/24/2016  . Vitamin B 12 deficiency 09/24/2016  . Vitamin D deficiency 09/24/2016  . Gastric ulcer   . Iron deficiency anemia due to chronic blood loss   . Abdominal pain, chronic, epigastric   . Acute gastric ulcer without hemorrhage or perforation   . Duodenal ulcer   . Closed fracture of nasal septum 08/27/2015  . Closed fracture of zygomatic arch ([place]) 08/27/2015  . Protein-calorie malnutrition, severe ([place]) 12/18/2013  . Choledocholithiasis 12/17/2013  . Sepsis ([place]) 12/17/2013  .  Osteopenia 11/17/2012  . Nonrheumatic aortic valve stenosis 10/17/2012  . Hx of radiation therapy   . Depression   . History of kidney stones   . Urinary urgency   . Gastroesophageal reflux disease without esophagitis   . Hyperlipidemia   . Essential hypertension   . Bronchitis   . Arthritis   . Dysphagia   . H/O hiatal hernia   . Primary cancer of upper outer quadrant of left female breast ([place]) 12/01/2011    Past Surgical History:    Procedure Laterality Date  . APPENDECTOMY    . bladder tack    . BREAST BIOPSY  1998   left   . DILATION AND CURETTAGE OF UTERUS    . ENDOSCOPIC RETROGRADE CHOLANGIOPANCREATOGRAPHY (ERCP) WITH PROPOFOL N/A 02/01/2014   Procedure: ENDOSCOPIC RETROGRADE CHOLANGIOPANCREATOGRAPHY (ERCP) WITH PROPOFOL;  Surgeon: [name], MD;  Location: [facility];  Service: Endoscopy;  Laterality: N/A;  . ESOPHAGOGASTRODUODENOSCOPY    . ESOPHAGOGASTRODUODENOSCOPY N/A 06/26/2016   Procedure: ESOPHAGOGASTRODUODENOSCOPY (EGD);  Surgeon: [name], MD;  Location: [facility];  Service: Endoscopy;  Laterality: N/A;  . ESOPHAGOGASTRODUODENOSCOPY (EGD) WITH PROPOFOL  12/19/2013   Procedure: ESOPHAGOGASTRODUODENOSCOPY (EGD) WITH PROPOFOL;  Surgeon: Beryle Beams, MD;  Location: [facility];  Service: Endoscopy;;  . ESOPHAGOGASTRODUODENOSCOPY (EGD) WITH PROPOFOL N/A 09/10/2016   Procedure: ESOPHAGOGASTRODUODENOSCOPY (EGD) WITH PROPOFOL;  Surgeon: [name], MD;  Location: [facility];  Service: Endoscopy;  Laterality: N/A;  . EUS  06/04/2011   Procedure: UPPER ENDOSCOPIC ULTRASOUND (EUS) LINEAR;  Surgeon: Owens Loffler, MD;  Location: [facility];  Service: Endoscopy;  Laterality: N/A;  . EUS N/A 02/01/2014   Procedure: UPPER ENDOSCOPIC ULTRASOUND (EUS) LINEAR;  Surgeon: [name], MD;  Location: [facility];  Service: Endoscopy;  Laterality: N/A;  . EXPLORATORY LAPAROTOMY      biopsy of intra-abdominal mass  . MASTECTOMY W/ SENTINEL NODE BIOPSY  12/18/2011   Procedure: MASTECTOMY WITH SENTINEL LYMPH NODE BIOPSY;  Surgeon: Rolm Bookbinder, MD;  Location: [facility];  Service: General;  Laterality: Left;  . PORT-A-CATH REMOVAL Right 06/15/2013   Procedure: REMOVAL PORT-A-CATH;  Surgeon: Rolm Bookbinder, MD;  Location: [facility];  Service: General;  Laterality: Right;  . PORTACATH PLACEMENT  01/27/2012   Procedure: INSERTION PORT-A-CATH;  Surgeon: Rolm Bookbinder, MD;   Location: [facility];  Service: General;  Laterality: Right;  PORT PLACEMENT  . TOTAL SHOULDER REPLACEMENT  2011   left  . TUBAL LIGATION      OB History    No data available       Home Medications    Prior to Admission medications   Medication Sig Start Date End Date Taking? Authorizing Provider  acetaminophen (TYLENOL) 500 MG tablet Take 1,000 mg by mouth every 6 (six) hours as needed (pain).   Yes [provider]  amLODipine (NORVASC) 10 MG tablet Take 10 mg by mouth every morning.  02/13/11  Yes [provider]  atorvastatin (LIPITOR) 40 MG tablet Take 1 tablet by mouth daily. 04/27/16  Yes [provider]  citalopram (CELEXA) 40 MG tablet Take 1 tablet by mouth daily. 06/01/16  Yes [provider]  ferrous sulfate 325 (65 FE) MG tablet Take 325 mg by mouth daily with breakfast.   Yes [provider]  gabapentin (NEURONTIN) 300 MG capsule Take 1 capsule (300 mg total) by mouth at bedtime. 09/01/16  Yes Nicholas Lose, MD  letrozole [facility]) 2.5 MG tablet Take 1 tablet (2.5 mg total)  by mouth daily. 11/25/15  Yes Nicholas Lose, MD  omeprazole (PRILOSEC) 40 MG capsule Take 1 capsule (40 mg total) by mouth 2 (two) times daily before a meal. 06/26/16  Yes [name], MD  ondansetron (ZOFRAN) 4 MG tablet Take 1 tablet (4 mg total) by mouth 2 (two) times daily. 11/13/16  Yes [name], MD  spironolactone (ALDACTONE) 25 MG tablet TAKE 1/2 TABLET BY MOUTH EVERY DAY 08/05/16  Yes [name], [name] D, NP  valsartan-hydrochlorothiazide (DIOVAN-HCT) 320-25 MG tablet Take 1 tablet by mouth daily. 05/25/16  Yes [provider]  Vitamin D, Ergocalciferol, (DRISDOL) 50000 units CAPS capsule Take 50,000 Units by mouth every 7 (seven) days.   Yes [provider]    Family History Family History  Problem Relation Age of Onset  . Prostate cancer Father   . Breast cancer Other     Social History Social History   Substance Use Topics  . Smoking status: Never Smoker  . Smokeless tobacco: Never Used  . Alcohol use No     Allergies   Patient has no known allergies.   Review of Systems Review of Systems  Constitutional: Negative for appetite change, chills, diaphoresis, fatigue and fever.  HENT: Negative for mouth sores, sore throat and trouble swallowing.   Eyes: Negative for visual disturbance.  Respiratory: Negative for cough, chest tightness, shortness of breath and wheezing.   Cardiovascular: Negative for chest pain.  Gastrointestinal: Positive for abdominal pain, nausea and vomiting. Negative for abdominal distention.  Endocrine: Negative for polydipsia, polyphagia and polyuria.  Genitourinary: Negative for dysuria, frequency and hematuria.  Musculoskeletal: Negative for gait problem.  Skin: Negative for color change, pallor and rash.  Neurological: Negative for dizziness, syncope, light-headedness and headaches.  Hematological: Does not bruise/bleed easily.  Psychiatric/Behavioral: Negative for behavioral problems and confusion.     Physical Exam Updated Vital Signs BP 90/77 (BP Location: Right Arm)   Pulse 94   Temp 97.9 F (36.6 C) (Oral)   Resp 18   SpO2 92%   Physical Exam  Constitutional: She is oriented to person, place, and time. She appears well-developed and well-nourished. No distress.  HENT:  Head: Normocephalic.  Eyes: Conjunctivae are normal. Pupils are equal, round, and reactive to light. No scleral icterus.  Neck: Normal range of motion. Neck supple. No thyromegaly present.  Cardiovascular: Normal rate and regular rhythm.  Exam reveals no gallop and no friction rub.   No murmur heard. Pulmonary/Chest: Effort normal and breath sounds normal. No respiratory distress. She has no wheezes. She has no rales.  Abdominal: She exhibits no distension. There is no tenderness. There is no rebound.    Musculoskeletal: Normal range of motion.  Neurological: She is  alert and oriented to person, place, and time.  Skin: Skin is warm and dry. No rash noted.  Psychiatric: She has a normal mood and affect. Her behavior is normal.     ED Treatments / Results  Labs (all labs ordered are listed, but only abnormal results are displayed) Labs Reviewed  COMPREHENSIVE METABOLIC PANEL - Abnormal; Notable for the following:       Result Value   Chloride 100 (*)    CO2 20 (*)    Glucose, Bld 145 (*)    BUN 60 (*)    Creatinine, Ser 2.50 (*)    GFR calc non Af Amer 17 (*)    GFR calc Af Amer 20 (*)    Anion gap 16 (*)    All  other components within normal limits  CBC - Abnormal; Notable for the following:    WBC 16.5 (*)    RDW 16.6 (*)    All other components within normal limits  LIPASE, BLOOD  URINALYSIS, ROUTINE W REFLEX MICROSCOPIC  I-STAT CG4 LACTIC ACID, ED    EKG  EKG Interpretation None       Radiology Ct Abdomen Pelvis Wo Contrast  Result Date: 11/15/2016 CLINICAL DATA:  Generalized abdominal pain and emesis x2 weeks. EXAM: CT ABDOMEN AND PELVIS WITHOUT CONTRAST TECHNIQUE: Multidetector CT imaging of the abdomen and pelvis was performed following the standard protocol without IV contrast. COMPARISON:  12/17/2013 FINDINGS: Lower chest: Cardiomegaly with aortic atherosclerosis, mitral annular calcifications and coronary arteriosclerosis. No pericardial effusion. There is atelectasis at the lung bases. Stable moderate-sized hiatal hernia. Hepatobiliary: Punctate calcification in the right hepatic dome consistent with the tiny granuloma or vascular calcification. No space-occupying mass of the liver noted. Gallbladder is nondistended. No gallstones are seen. No biliary dilatation is identified. Pancreas: Unremarkable. No pancreatic ductal dilatation or surrounding inflammatory changes.Normal maxilla normal Spleen: Normal in size without focal abnormality. Adrenals/Urinary Tract: Normal bilateral adrenal glands. Stable cortical and parapelvic cysts  noted of both kidneys. Hyperdense exophytic lesion off the upper pole the right kidney is stable likely to represent a proteinaceous or hemorrhagic cyst, also stable in size in consistent with a benign finding given stability since 2015. No obstructive uropathy is identified. There are punctate calcifications within both kidneys which may be secondary to vascular calcifications or nephrolithiasis. Stomach/Bowel: Moderate fluid-filled distention of the stomach. Dilated fluid-filled small bowel is seen starting from the second portion of the duodenum leading up to a transition point within a left periumbilical ventral hernia, series 2, image 60. There is one right-sided periumbilical ventral hernia containing nondistended loops of ileum, a fat containing umbilical hernia, and two left-sided periumbilical ventral hernias, the larger of which demonstrates the transition point. The more medial of the left-sided hernias contains omental fat, nondistended ileum and fluid-filled distended jejunum up to 4.1 cm. A short segment of incarcerated jejunum is not entirely excluded. The more lateral left-sided hernia contains omental fat and nondistended ileum. Vascular/Lymphatic: Aortic atherosclerosis. No enlarged abdominal or pelvic lymph nodes. Reproductive: Atrophic appearance of the uterus consistent with patient's age. No adnexal mass. Other: No abdominopelvic ascites. Musculoskeletal: Thoracolumbar spondylosis. No acute nor suspicious osseous findings. IMPRESSION: 1. Three ventral, periumbilical hernias are identified containing small bowel, one to the right of the umbilicus and two to the left. The more medial of the left-sided ventral hernias demonstrates a transition point contributing to a high-grade small bowel obstruction. A short segment of fluid-filled dilated small bowel is seen measuring up to 4.1 cm in caliber within the medial left-sided hernia and incarceration is not entirely excluded. Correlate for  reducibility. 2. Stable simple and complex cysts as well as punctate bilateral nephrolithiasis of the kidneys. 3. Stable cardiomegaly with mitral annular calcifications and coronary arteriosclerosis. Moderate-sized hiatal hernia. Electronically Signed   By: Ashley Royalty M.D.   On: 11/15/2016 21:19    Procedures Procedures (including critical care time)  Medications Ordered in ED Medications  0.9 %  sodium chloride infusion (not administered)  morphine 4 MG/ML injection 4 mg (4 mg Intravenous Given 11/15/16 2059)  ondansetron (ZOFRAN-ODT) disintegrating tablet 4 mg (4 mg Oral Given 11/15/16 1854)  sodium chloride 0.9 % bolus 1,000 mL (1,000 mLs Intravenous New Bag/Given 11/15/16 2057)  ondansetron (ZOFRAN) injection 4 mg (4 mg Intravenous Given 11/15/16  2058)     Initial Impression / Assessment and Plan / ED Course  I have reviewed the triage vital signs and the nursing notes.  Pertinent labs & imaging results that were available during my care of the patient were reviewed by me and considered in my medical decision making (see chart for details).     Labs show acute kidney injury. Leukocytosis. CT scan shows incarcerated small bowel in her left ventral hernia. Prior to CT was given IV fluids, pain medication, and antiemetics.  I discussed CT findings with the patient and her daughter. I spent approximately 10 minutes at the bedside and have been able to successfully reduce this hernia. Does not immediately recur. I placed a call to hospitalist regarding admission for IV fluids and treatment for her acute kidney injury. Also placed a call to general surgery to discuss the hernia, and small bowel obstruction.  Final Clinical Impressions(s) / ED Diagnoses   Final diagnoses:  Non-intractable vomiting with nausea, unspecified vomiting type  Ventral hernia without obstruction or gangrene  AKI (acute kidney injury) ([place])   I discussed the case with Dr. Hassell Done of general surgery. He will see the  patient in consultation. Also discussed with Dr. Lorin Mercy, tried hospitalist. Patient be admitted under hospitalist.    New Prescriptions New Prescriptions   No medications on file     Tanna Furry, MD 11/15/16 2141    Tanna Furry, MD 11/15/16 2245

## 2016-11-15 NOTE — ED Triage Notes (Signed)
Pt from home with complaints of generalized abdominal pain and emesis x 2 weeks. Pt states she has had 2 episodes of emesis today. Pt states this feels similar to when she has gallstones. Pt denies diarrhea or urinary symptoms.

## 2016-11-16 ENCOUNTER — Inpatient Hospital Stay (HOSPITAL_COMMUNITY): Payer: Medicare HMO

## 2016-11-16 ENCOUNTER — Encounter (HOSPITAL_COMMUNITY): Payer: Self-pay | Admitting: *Deleted

## 2016-11-16 DIAGNOSIS — R112 Nausea with vomiting, unspecified: Secondary | ICD-10-CM | POA: Diagnosis not present

## 2016-11-16 DIAGNOSIS — R739 Hyperglycemia, unspecified: Secondary | ICD-10-CM | POA: Diagnosis not present

## 2016-11-16 DIAGNOSIS — I10 Essential (primary) hypertension: Secondary | ICD-10-CM

## 2016-11-16 DIAGNOSIS — S31109A Unspecified open wound of abdominal wall, unspecified quadrant without penetration into peritoneal cavity, initial encounter: Secondary | ICD-10-CM | POA: Diagnosis not present

## 2016-11-16 DIAGNOSIS — E785 Hyperlipidemia, unspecified: Secondary | ICD-10-CM | POA: Diagnosis not present

## 2016-11-16 DIAGNOSIS — K56609 Unspecified intestinal obstruction, unspecified as to partial versus complete obstruction: Secondary | ICD-10-CM

## 2016-11-16 DIAGNOSIS — I350 Nonrheumatic aortic (valve) stenosis: Secondary | ICD-10-CM | POA: Diagnosis not present

## 2016-11-16 DIAGNOSIS — R69 Illness, unspecified: Secondary | ICD-10-CM | POA: Diagnosis not present

## 2016-11-16 DIAGNOSIS — R0602 Shortness of breath: Secondary | ICD-10-CM | POA: Diagnosis not present

## 2016-11-16 DIAGNOSIS — Z01810 Encounter for preprocedural cardiovascular examination: Secondary | ICD-10-CM

## 2016-11-16 DIAGNOSIS — K436 Other and unspecified ventral hernia with obstruction, without gangrene: Secondary | ICD-10-CM | POA: Diagnosis not present

## 2016-11-16 DIAGNOSIS — R0609 Other forms of dyspnea: Secondary | ICD-10-CM | POA: Diagnosis not present

## 2016-11-16 DIAGNOSIS — K460 Unspecified abdominal hernia with obstruction, without gangrene: Secondary | ICD-10-CM | POA: Diagnosis not present

## 2016-11-16 DIAGNOSIS — G9341 Metabolic encephalopathy: Secondary | ICD-10-CM | POA: Diagnosis not present

## 2016-11-16 DIAGNOSIS — K430 Incisional hernia with obstruction, without gangrene: Secondary | ICD-10-CM | POA: Diagnosis not present

## 2016-11-16 DIAGNOSIS — N179 Acute kidney failure, unspecified: Secondary | ICD-10-CM | POA: Diagnosis not present

## 2016-11-16 DIAGNOSIS — J95821 Acute postprocedural respiratory failure: Secondary | ICD-10-CM | POA: Diagnosis not present

## 2016-11-16 DIAGNOSIS — K5669 Other partial intestinal obstruction: Secondary | ICD-10-CM | POA: Diagnosis not present

## 2016-11-16 DIAGNOSIS — E43 Unspecified severe protein-calorie malnutrition: Secondary | ICD-10-CM | POA: Diagnosis not present

## 2016-11-16 DIAGNOSIS — K219 Gastro-esophageal reflux disease without esophagitis: Secondary | ICD-10-CM | POA: Diagnosis not present

## 2016-11-16 DIAGNOSIS — K659 Peritonitis, unspecified: Secondary | ICD-10-CM | POA: Diagnosis not present

## 2016-11-16 LAB — BASIC METABOLIC PANEL
Anion gap: 11 (ref 5–15)
BUN: 67 mg/dL — AB (ref 6–20)
CO2: 23 mmol/L (ref 22–32)
Calcium: 8.3 mg/dL — ABNORMAL LOW (ref 8.9–10.3)
Chloride: 104 mmol/L (ref 101–111)
Creatinine, Ser: 2.54 mg/dL — ABNORMAL HIGH (ref 0.44–1.00)
GFR calc Af Amer: 19 mL/min — ABNORMAL LOW (ref >60)
GFR calc non Af Amer: 17 mL/min — ABNORMAL LOW (ref >60)
GLUCOSE: 111 mg/dL — AB (ref 65–99)
POTASSIUM: 4.1 mmol/L (ref 3.5–5.1)
Sodium: 138 mmol/L (ref 135–145)

## 2016-11-16 LAB — CBC
HEMATOCRIT: 34.4 % — AB (ref 36.0–46.0)
Hemoglobin: 10.8 g/dL — ABNORMAL LOW (ref 12.0–15.0)
MCH: 27.7 pg (ref 26.0–34.0)
MCHC: 31.4 g/dL (ref 30.0–36.0)
MCV: 88.2 fL (ref 78.0–100.0)
Platelets: 311 10*3/uL (ref 150–400)
RBC: 3.9 MIL/uL (ref 3.87–5.11)
RDW: 16.7 % — AB (ref 11.5–15.5)
WBC: 12.9 10*3/uL — ABNORMAL HIGH (ref 4.0–10.5)

## 2016-11-16 MED ORDER — ONDANSETRON HCL 4 MG/2ML IJ SOLN: 4.0000 mg | Freq: Four times a day (QID) | INTRAMUSCULAR | Status: DC | PRN

## 2016-11-16 MED ORDER — LACTATED RINGERS IV SOLN
INTRAVENOUS | Status: DC
  Administered 2016-11-16 (×3): via INTRAVENOUS

## 2016-11-16 MED ORDER — ACETAMINOPHEN 325 MG PO TABS: 650.0000 mg | ORAL_TABLET | Freq: Four times a day (QID) | ORAL | Status: DC | PRN

## 2016-11-16 MED ORDER — ONDANSETRON HCL 4 MG PO TABS: 4.0000 mg | ORAL_TABLET | Freq: Four times a day (QID) | ORAL | Status: DC | PRN

## 2016-11-16 MED ORDER — ACETAMINOPHEN 650 MG RE SUPP: 650.0000 mg | Freq: Four times a day (QID) | RECTAL | Status: DC | PRN

## 2016-11-16 NOTE — Consult Note (Signed)
Cardiology Consultation:   Patient ID: Brandy [name]; [id number]; 09/22/1932   Admit date: 11/15/2016 Date of Consult: 11/16/2016  Primary Care Provider: Kathyrn Lass, MD Primary Cardiologist: [name] Primary Electrophysiologist:  N/A   Patient Profile:   Brandy [name] is a 83 y.o. female with a hx of moderate AS (last assessed by echo 09/2015), no known hx of CAD, HTN, hyperlipidemia, invasive ductal carcinoma in her left breast status post mastectomy and chemotherapy in 2013, mild asthma, anemia, depression, GERD, hiatal hernia whom we are asked to see by [name] for pre-operative clearance.   History of Present Illness:   At baseline [name] is not very active and walks with a cane due to frequent falls which she attributes to losing her balance. She saw Dr. Meda [name] in 09/2016 for evaluation of chronic dyspnea on exertion progressively worsening for the last several years. The plan was for an echocardiogram and possibly stress test if the echo was stable given inferior EKG changes. Her echo 10/05/16 showed EF 60-65%, grade 1 DD, moderate AS, trivial AI, mild LAE, unchanged AS from prior study. She has never had a stress test.  She was admitted to [name]15[facility]2016 with nausea, vomiting and abdominal pain for just under a week. GI called in nausea medication but she continued to have issues prompting ED eval. She was found to have acute renal failures with Cr of 2.5 (prev normal), leukocytosis, and CT with incarcerated small bowel in left ventral hernia which was reduced successfully. She has been seen by surgery with recommendation to proceed with laparotomy with lysis of adhesions and primary hernia repair when medically cleared. Most recent labs show normal lactate, Hgb 13.2->10.8, BUN 67/Cr2.54, normal LFTs.  VSS, pulse ox low normal. At baseline it sounds like the patient does not walk more than she has to to get around. She reports chronic DOE, possibly worse over the last year, causing  her to stop and rest. Has not had any chest pain, palpitations, orthopnea, LEE or syncope.  Past Medical History:  Diagnosis Date  . Anemia   . Arthritis    shoulders  . Asthma    mild, no inhalers used  . Breast cancer ([place]) 12/18/11   left breast masectomy=metastatic ca in (1/1) lymph node ,invasive ductal ca,2 foci,,dcis,lymph ovascular invasion identified,surgical resection margins neg for ca,additional tissue=benign skin and subcutaneous tissue  . Bronchitis    hx of;last time >16yr ago  . Depression    takes Celexa daily  . Difficult intubation   . Dysphagia   . Full dentures   . Gall stones 2015  . GERD (gastroesophageal reflux disease)    takes Prilosec prn  . H/O hiatal hernia   . History of kidney stones    many yrs ago  . Hx of radiation therapy 04/26/12 -06/10/12   left breast  . Hyperlipidemia    takes Simvastatin daily  . Hypertension    takes Amlodipine and Diovan daily  . Insomnia    takes Ambien nightly  . Urinary urgency     Past Surgical History:  Procedure Laterality Date  . APPENDECTOMY    . bladder tack    . BREAST BIOPSY  1998   left   . DILATION AND CURETTAGE OF UTERUS    . ENDOSCOPIC RETROGRADE CHOLANGIOPANCREATOGRAPHY (ERCP) WITH PROPOFOL N/A 02/01/2014   Procedure: ENDOSCOPIC RETROGRADE CHOLANGIOPANCREATOGRAPHY (ERCP) WITH PROPOFOL;  Surgeon: [name], MD;  Location: [facility];  Service: Endoscopy;  Laterality: N/A;  . ESOPHAGOGASTRODUODENOSCOPY    .  ESOPHAGOGASTRODUODENOSCOPY N/A 06/26/2016   Procedure: ESOPHAGOGASTRODUODENOSCOPY (EGD);  Surgeon: [name], MD;  Location: [place];  Service: Endoscopy;  Laterality: N/A;  . ESOPHAGOGASTRODUODENOSCOPY (EGD) WITH PROPOFOL  12/19/2013   Procedure: ESOPHAGOGASTRODUODENOSCOPY (EGD) WITH PROPOFOL;  Surgeon: Beryle Beams, MD;  Location: [place];  Service: Endoscopy;;  . ESOPHAGOGASTRODUODENOSCOPY (EGD) WITH PROPOFOL N/A 09/10/2016   Procedure: ESOPHAGOGASTRODUODENOSCOPY (EGD)  WITH PROPOFOL;  Surgeon: [name], MD;  Location: [facility];  Service: Endoscopy;  Laterality: N/A;  . EUS  06/04/2011   Procedure: UPPER ENDOSCOPIC ULTRASOUND (EUS) LINEAR;  Surgeon: Owens Loffler, MD;  Location: [facility];  Service: Endoscopy;  Laterality: N/A;  . EUS N/A 02/01/2014   Procedure: UPPER ENDOSCOPIC ULTRASOUND (EUS) LINEAR;  Surgeon: [name], MD;  Location: [facility];  Service: Endoscopy;  Laterality: N/A;  . EXPLORATORY LAPAROTOMY      biopsy of intra-abdominal mass  . MASTECTOMY W/ SENTINEL NODE BIOPSY  12/18/2011   Procedure: MASTECTOMY WITH SENTINEL LYMPH NODE BIOPSY;  Surgeon: Rolm Bookbinder, MD;  Location: [place];  Service: General;  Laterality: Left;  . PORT-A-CATH REMOVAL Right 06/15/2013   Procedure: REMOVAL PORT-A-CATH;  Surgeon: Rolm Bookbinder, MD;  Location: [place];  Service: General;  Laterality: Right;  . PORTACATH PLACEMENT  01/27/2012   Procedure: INSERTION PORT-A-CATH;  Surgeon: Rolm Bookbinder, MD;  Location: [place];  Service: General;  Laterality: Right;  PORT PLACEMENT  . TOTAL SHOULDER REPLACEMENT  2011   left  . TUBAL LIGATION       Inpatient Medications: Scheduled Meds:  Continuous Infusions: . lactated ringers 125 mL/hr at 11/16/16 1001   PRN Meds: acetaminophen **OR** acetaminophen, morphine injection, ondansetron **OR** ondansetron (ZOFRAN) IV  Allergies:   No Known Allergies  Social History:   Social History   Social History  . Marital status: Married    Spouse name: N/A  . Number of children: N/A  . Years of education: N/A   Occupational History  . retired    Social History Main Topics  . Smoking status: Never Smoker  . Smokeless tobacco: Never Used  . Alcohol use No  . Drug use: No  . Sexual activity: Yes    Birth control/ protection: Surgical     Comment: mensus age 83, 80st pregnancy 30, no hrt gg4,p3, 1 babay lived a few hours complications   Other Topics  Concern  . Not on file   Social History Narrative  . No narrative on file    Family History:   The patient's family history includes Breast cancer in her other; Prostate cancer in her father.  ROS:  Please see the history of present illness.  All other ROS reviewed and negative.     Physical Exam/Data:   Vitals:   11/15/16 2323 11/16/16 0000 11/16/16 0019 11/16/16 0522  BP: (!) 109/58 (!) 115/52 (!) 113/51 (!) 120/58  Pulse: 84 84 85 70  Resp: 16  16 18   Temp:   98.4 F (36.9 C) 98.2 F (36.8 C)  TempSrc:   Oral Oral  SpO2: 92% 91% 93% 93%  Weight:   160 lb 15 oz (73 kg)   Height:   4\' 11"  (1.499 m)     Intake/Output Summary (Last 24 hours) at 11/16/16 1127 Last data filed at 11/16/16 1000  Gross per 24 hour  Intake            687.5 ml  Output  100 ml  Net            587.5 ml   Filed Weights   11/16/16 0019  Weight: 160 lb 15 oz (73 kg)   Body mass index is 32.51 kg/m.  General: Well developed, well nourished WF, in no acute distress. Head: Normocephalic, atraumatic, sclera non-icteric, no xanthomas, nares are without discharge. Neck: Negative for carotid bruits. JVD not elevated. Lungs: Clear bilaterally to auscultation without wheezes, rales, or rhonchi. Breathing is unlabored. Heart: RRR with S1 S2, 3/6 SEM heard across entire precordium. No rubs or gallops appreciated. Abdomen: Soft, non-tender, non-distended with normoactive bowel sounds. No hepatomegaly. No rebound/guarding. No obvious abdominal masses. Msk:  Strength and tone appear normal for age. Extremities: No clubbing or cyanosis. No edema.  Distal pedal pulses are 2+ and equal bilaterally. Neuro: Alert and oriented X 3. No facial asymmetry. No focal deficit. Moves all extremities spontaneously. Psych:  Responds to questions appropriately with a normal affect.  EKG:  The EKG was personally reviewed and demonstrates NSR/sinus arrhythmia, minimal voltage criteria for LVH, possible prior  septal and inferior infarct, nonspecific TW changes. Prior EKG for comparison was poor quality.  Relevant CV Studies: 2D Echo 10/05/16 Study Conclusions  - Left ventricle: The cavity size was normal. Wall thickness was   normal. Systolic function was normal. The estimated ejection   fraction was in the range of 60% to 65%. Wall motion was normal;   there were no regional wall motion abnormalities. Doppler   parameters are consistent with abnormal left ventricular   relaxation (grade 1 diastolic dysfunction). Doppler parameters   are consistent with elevated mean left atrial filling pressure. - Aortic valve: Valve mobility was moderately restricted. There was   moderate stenosis. There was trivial regurgitation. Mean gradient   (S): 25 mm Hg. Peak gradient (S): 43 mm Hg. Valve area (VTI):   1.37 cm^2. Valve area (Vmax): 1.39 cm^2. Valve area (Vmean): 1.35   cm^2. - Mitral valve: Moderately to severely calcified annulus. Mildly   thickened leaflets . Mean gradient (D): 3 mm Hg. Peak gradient   (D): 9 mm Hg. Valve area by pressure half-time: 2.34 cm^2. Valve   area by continuity equation (using LVOT flow): 2.64 cm^2. - Left atrium: The atrium was mildly dilated. - Atrial septum: No defect or patent foramen ovale was identified.  Impressions:  - The degree of aortic stenosis appears unchanged compared to the   last study (gradients may have been slightly overestimated on the   previous study due to the irregular rhythm).  Laboratory Data:  Chemistry Recent Labs Lab 11/15/16 1850 11/16/16 0415  NA 136 138  K 4.6 4.1  CL 100* 104  CO2 20* 23  GLUCOSE 145* 111*  BUN 60* 67*  CREATININE 2.50* 2.54*  CALCIUM 9.4 8.3*  GFRNONAA 17* 17*  GFRAA 20* 19*  ANIONGAP 16* 11     Recent Labs Lab 11/15/16 1850  PROT 7.8  ALBUMIN 3.9  AST 27  ALT 26  ALKPHOS 64  BILITOT 0.8   Hematology Recent Labs Lab 11/15/16 1850 11/16/16 0415  WBC 16.5* 12.9*  RBC 4.53 3.90  HGB  13.2 10.8*  HCT 39.6 34.4*  MCV 87.4 88.2  MCH 29.1 27.7  MCHC 33.3 31.4  RDW 16.6* 16.7*  PLT 368 311   Cardiac EnzymesNo results for input(s): TROPONINI in the last 168 hours. No results for input(s): TROPIPOC in the last 168 hours.  BNPNo results for input(s): BNP, PROBNP in the  last 168 hours.  DDimer No results for input(s): DDIMER in the last 168 hours.  Radiology/Studies:  Ct Abdomen Pelvis Wo Contrast  Result Date: 11/15/2016 CLINICAL DATA:  Generalized abdominal pain and emesis x2 weeks. EXAM: CT ABDOMEN AND PELVIS WITHOUT CONTRAST TECHNIQUE: Multidetector CT imaging of the abdomen and pelvis was performed following the standard protocol without IV contrast. COMPARISON:  12/17/2013 FINDINGS: Lower chest: Cardiomegaly with aortic atherosclerosis, mitral annular calcifications and coronary arteriosclerosis. No pericardial effusion. There is atelectasis at the lung bases. Stable moderate-sized hiatal hernia. Hepatobiliary: Punctate calcification in the right hepatic dome consistent with the tiny granuloma or vascular calcification. No space-occupying mass of the liver noted. Gallbladder is nondistended. No gallstones are seen. No biliary dilatation is identified. Pancreas: Unremarkable. No pancreatic ductal dilatation or surrounding inflammatory changes.Normal maxilla normal Spleen: Normal in size without focal abnormality. Adrenals/Urinary Tract: Normal bilateral adrenal glands. Stable cortical and parapelvic cysts noted of both kidneys. Hyperdense exophytic lesion off the upper pole the right kidney is stable likely to represent a proteinaceous or hemorrhagic cyst, also stable in size in consistent with a benign finding given stability since 2015. No obstructive uropathy is identified. There are punctate calcifications within both kidneys which may be secondary to vascular calcifications or nephrolithiasis. Stomach/Bowel: Moderate fluid-filled distention of the stomach. Dilated fluid-filled  small bowel is seen starting from the second portion of the duodenum leading up to a transition point within a left periumbilical ventral hernia, series 2, image 60. There is one right-sided periumbilical ventral hernia containing nondistended loops of ileum, a fat containing umbilical hernia, and two left-sided periumbilical ventral hernias, the larger of which demonstrates the transition point. The more medial of the left-sided hernias contains omental fat, nondistended ileum and fluid-filled distended jejunum up to 4.1 cm. A short segment of incarcerated jejunum is not entirely excluded. The more lateral left-sided hernia contains omental fat and nondistended ileum. Vascular/Lymphatic: Aortic atherosclerosis. No enlarged abdominal or pelvic lymph nodes. Reproductive: Atrophic appearance of the uterus consistent with patient's age. No adnexal mass. Other: No abdominopelvic ascites. Musculoskeletal: Thoracolumbar spondylosis. No acute nor suspicious osseous findings. IMPRESSION: 1. Three ventral, periumbilical hernias are identified containing small bowel, one to the right of the umbilicus and two to the left. The more medial of the left-sided ventral hernias demonstrates a transition point contributing to a high-grade small bowel obstruction. A short segment of fluid-filled dilated small bowel is seen measuring up to 4.1 cm in caliber within the medial left-sided hernia and incarceration is not entirely excluded. Correlate for reducibility. 2. Stable simple and complex cysts as well as punctate bilateral nephrolithiasis of the kidneys. 3. Stable cardiomegaly with mitral annular calcifications and coronary arteriosclerosis. Moderate-sized hiatal hernia. Electronically Signed   By: Ashley Royalty M.D.   On: 11/15/2016 21:19   Dg Abd 2 Views  Result Date: 11/16/2016 CLINICAL DATA:  Nausea and vomiting EXAM: ABDOMEN - 2 VIEW COMPARISON:  CT abdomen and pelvis Nov 15, 2016 FINDINGS: Supine and upright images obtained.  There is moderate stool throughout the colon. There are loops of dilated small bowel multiple air-fluid levels. No free air. There are minimal pleural effusions bilaterally. IMPRESSION: Bowel gas pattern indicative of a degree of bowel obstruction. No free air evident. Electronically Signed   By: Lowella Grip III M.D.   On: 11/16/2016 08:54    Assessment and Plan:   1. Pre-operative evaluation 2. Moderate aortic stenosis 3. Chronic dyspnea on exertion 4. Essential HTN, controlled 5. Abnormal EKG 6. Acute renal  insufficiency 7. Anemia  The patient denies any recent chest pain, palpitations or syncope. She has had chronic DOE for several years that she feels might be slightly worse this year but she also has had a decline in her general activity level, so difficult to know how much may be related to deconditioning. She also has chronic anemia which could play a role. Suspect her Hgb of 13 was in setting of dehydration on admission/hemoconcentration as prior values have ranged in 8-10 range. Has not had prior ischemic assessment, but her AS was most recently evaluated just last month and was felt to be stable/moderate. I will plan to review the above with Dr. Oval Linsey with regards to pre-operative testing.  Signed, Charlie Pitter, PA-C  11/16/2016 11:27 AM

## 2016-11-16 NOTE — Progress Notes (Addendum)
PROGRESS NOTE    Brandy [name]  EHU:[id number] DOB: 1932-09-24 DOA: 11/15/2016 PCP: Kathyrn Lass, MD    Brief Narrative:  83 y.o. female with medical history significant of HTN, HLD, remote breast cancer, GERD, and gallstones without h/o cholecystectomy presenting with n/v which began last Tuesday night.  She has been vomiting ever since.  Called Dr. Ardis Hughs, who called in some nausea pills which helped a bit.  Vomited about 2-3 times daily.  Pain in the area around her ventral hernia since Tuesday.  Pain is better following hernia reduction in the ER.  Last BM was Wednesday or Thursday and was normal; it is unusual for her to go so long between BMs.  No fevers.  ED Course: acute renal failure and leukocytosis on labs.  CT with incarcerated small bowel in left ventral hernia which was reduced successfully.  IVF, pain medication, antiemetics given.  Dr. Hassell Done will consult on the patient.  Assessment & Plan:   Principal Problem:   Incarcerated hernia Active Problems:   Depression   Gastroesophageal reflux disease without esophagitis   Hyperlipidemia   Essential hypertension   Acute renal failure (ARF) (HCC)   Hyperglycemia  Incarcerated hernia -Patient with large ventral hernia with incarceration seen on CT -Reduction performed by Dr. Jeneen Rinks in the ER with success -Patient currently asymptomatic, denies nausea or abd pain -No NG tube currently -Discussed case with General Surgery. Tentative plans for surgery today or tomorrow -Have consulted Cardiology for cardiac clearance -Have ordered EKG and CXR for preoperative clearance, pending -Pt denies chest pain or sob, vital signs stable, lungs clear on exam, pt on minimal O2 support, electrolytes unremarkable -If patient cleared by Cardiology and CXR unremarkable, then patient would be medically cleared for surgery. Will follow up on EKG and CXR  Acute renal failure -Patient reports h/o intermittent n/v with decreased PO since 4-5 days  prior to admission -Suspect prerenal azotemia -Patient continued on 125cc/hr of IVF -Continue to avoid nephrotoxic agents - Will repeat BMET in AM  Depression -Currently holding PO medications at this time -Consider resuming antidepressant when no longer NPO  HTN -Borderline low BP on admission, which is likely related to volume deficiency -BP meds remain on hold  Hyperglycemia -Random glucose of 145 -May be stress response -Follow BMET  Moderate AS -Recent 2d echo reviewed from 4/18 -Patient with stable moderate AS noted -Per above, Cardiology consulted for clearance  DVT prophylaxis: SCD's Code Status: DNR Family Communication: Pt in room, family at bedside Disposition Plan: Uncertain at this time  Consultants:   General Surgery  Cardiology  Procedures:     Antimicrobials: Anti-infectives    None       Subjective: Denies chest pain, sob, abd pain, or nausea at this time  Objective: Vitals:   11/15/16 2323 11/16/16 0000 11/16/16 0019 11/16/16 0522  BP: (!) 109/58 (!) 115/52 (!) 113/51 (!) 120/58  Pulse: 84 84 85 70  Resp: 16  16 18   Temp:   98.4 F (36.9 C) 98.2 F (36.8 C)  TempSrc:   Oral Oral  SpO2: 92% 91% 93% 93%  Weight:   73 kg (160 lb 15 oz)   Height:   4\' 11"  (1.499 m)     Intake/Output Summary (Last 24 hours) at 11/16/16 1039 Last data filed at 11/16/16 0600  Gross per 24 hour  Intake            687.5 ml  Output  100 ml  Net            587.5 ml   Filed Weights   11/16/16 0019  Weight: 73 kg (160 lb 15 oz)    Examination:  General exam: Appears calm and comfortable  Respiratory system: Clear to auscultation. Respiratory effort normal. Cardiovascular system: S1 & S2 heard, RRR. Systolic murmur Gastrointestinal system: decreased BS, palpable LLQ abd hernia, mildly distended Central nervous system: Alert and oriented. No focal neurological deficits. Extremities: Symmetric 5 x 5 power. Skin: No rashes, lesions    Psychiatry: Judgement and insight appear normal. Mood & affect appropriate.   Data Reviewed: I have personally reviewed following labs and imaging studies  CBC:  Recent Labs Lab 11/15/16 1850 11/16/16 0415  WBC 16.5* 12.9*  HGB 13.2 10.8*  HCT 39.6 34.4*  MCV 87.4 88.2  PLT 368 683   Basic Metabolic Panel:  Recent Labs Lab 11/15/16 1850 11/16/16 0415  NA 136 138  K 4.6 4.1  CL 100* 104  CO2 20* 23  GLUCOSE 145* 111*  BUN 60* 67*  CREATININE 2.50* 2.54*  CALCIUM 9.4 8.3*   GFR: Estimated Creatinine Clearance: 15.1 mL/min (A) (by C-G formula based on SCr of 2.54 mg/dL (H)). Liver Function Tests:  Recent Labs Lab 11/15/16 1850  AST 27  ALT 26  ALKPHOS 64  BILITOT 0.8  PROT 7.8  ALBUMIN 3.9    Recent Labs Lab 11/15/16 1850  LIPASE 24   No results for input(s): AMMONIA in the last 168 hours. Coagulation Profile: No results for input(s): INR, PROTIME in the last 168 hours. Cardiac Enzymes: No results for input(s): CKTOTAL, CKMB, CKMBINDEX, TROPONINI in the last 168 hours. BNP (last 3 results) No results for input(s): PROBNP in the last 8760 hours. HbA1C: No results for input(s): HGBA1C in the last 72 hours. CBG: No results for input(s): GLUCAP in the last 168 hours. Lipid Profile: No results for input(s): CHOL, HDL, LDLCALC, TRIG, CHOLHDL, LDLDIRECT in the last 72 hours. Thyroid Function Tests: No results for input(s): TSH, T4TOTAL, FREET4, T3FREE, THYROIDAB in the last 72 hours. Anemia Panel: No results for input(s): VITAMINB12, FOLATE, FERRITIN, TIBC, IRON, RETICCTPCT in the last 72 hours. Sepsis Labs:  Recent Labs Lab 11/15/16 2119  LATICACIDVEN 1.72    No results found for this or any previous visit (from the past 240 hour(s)).   Radiology Studies: Ct Abdomen Pelvis Wo Contrast  Result Date: 11/15/2016 CLINICAL DATA:  Generalized abdominal pain and emesis x2 weeks. EXAM: CT ABDOMEN AND PELVIS WITHOUT CONTRAST TECHNIQUE: Multidetector CT  imaging of the abdomen and pelvis was performed following the standard protocol without IV contrast. COMPARISON:  12/17/2013 FINDINGS: Lower chest: Cardiomegaly with aortic atherosclerosis, mitral annular calcifications and coronary arteriosclerosis. No pericardial effusion. There is atelectasis at the lung bases. Stable moderate-sized hiatal hernia. Hepatobiliary: Punctate calcification in the right hepatic dome consistent with the tiny granuloma or vascular calcification. No space-occupying mass of the liver noted. Gallbladder is nondistended. No gallstones are seen. No biliary dilatation is identified. Pancreas: Unremarkable. No pancreatic ductal dilatation or surrounding inflammatory changes.Normal maxilla normal Spleen: Normal in size without focal abnormality. Adrenals/Urinary Tract: Normal bilateral adrenal glands. Stable cortical and parapelvic cysts noted of both kidneys. Hyperdense exophytic lesion off the upper pole the right kidney is stable likely to represent a proteinaceous or hemorrhagic cyst, also stable in size in consistent with a benign finding given stability since 2015. No obstructive uropathy is identified. There are punctate calcifications within both kidneys which  may be secondary to vascular calcifications or nephrolithiasis. Stomach/Bowel: Moderate fluid-filled distention of the stomach. Dilated fluid-filled small bowel is seen starting from the second portion of the duodenum leading up to a transition point within a left periumbilical ventral hernia, series 2, image 60. There is one right-sided periumbilical ventral hernia containing nondistended loops of ileum, a fat containing umbilical hernia, and two left-sided periumbilical ventral hernias, the larger of which demonstrates the transition point. The more medial of the left-sided hernias contains omental fat, nondistended ileum and fluid-filled distended jejunum up to 4.1 cm. A short segment of incarcerated jejunum is not entirely  excluded. The more lateral left-sided hernia contains omental fat and nondistended ileum. Vascular/Lymphatic: Aortic atherosclerosis. No enlarged abdominal or pelvic lymph nodes. Reproductive: Atrophic appearance of the uterus consistent with patient's age. No adnexal mass. Other: No abdominopelvic ascites. Musculoskeletal: Thoracolumbar spondylosis. No acute nor suspicious osseous findings. IMPRESSION: 1. Three ventral, periumbilical hernias are identified containing small bowel, one to the right of the umbilicus and two to the left. The more medial of the left-sided ventral hernias demonstrates a transition point contributing to a high-grade small bowel obstruction. A short segment of fluid-filled dilated small bowel is seen measuring up to 4.1 cm in caliber within the medial left-sided hernia and incarceration is not entirely excluded. Correlate for reducibility. 2. Stable simple and complex cysts as well as punctate bilateral nephrolithiasis of the kidneys. 3. Stable cardiomegaly with mitral annular calcifications and coronary arteriosclerosis. Moderate-sized hiatal hernia. Electronically Signed   By: Ashley Royalty M.D.   On: 11/15/2016 21:19   Dg Abd 2 Views  Result Date: 11/16/2016 CLINICAL DATA:  Nausea and vomiting EXAM: ABDOMEN - 2 VIEW COMPARISON:  CT abdomen and pelvis Nov 15, 2016 FINDINGS: Supine and upright images obtained. There is moderate stool throughout the colon. There are loops of dilated small bowel multiple air-fluid levels. No free air. There are minimal pleural effusions bilaterally. IMPRESSION: Bowel gas pattern indicative of a degree of bowel obstruction. No free air evident. Electronically Signed   By: Lowella Grip III M.D.   On: 11/16/2016 08:54    Scheduled Meds: Continuous Infusions: . lactated ringers 125 mL/hr at 11/16/16 1001     LOS: 1 day   CHIU, Orpah Melter, MD [facility] Pager [phone]  If 7PM-7AM, please contact  night-coverage [email or web address] Password [facility] 11/16/2016, 10:39 AM

## 2016-11-16 NOTE — Progress Notes (Signed)
Scheduled transport to [facility] to [facility] in am. Patient to be there by [place] am  [place]

## 2016-11-16 NOTE — Progress Notes (Addendum)
Spoke with [facility] to request expedited early nuc in AM. They will call patient's nurse to discuss ASAP arrival from [facility] for AM. Also discussed with IM who will review diet/fluids with gen surg. Have made NPO after midnight and placed order for nuc. Enes Rokosz PA-C

## 2016-11-16 NOTE — Consult Note (Signed)
Reason for Consult:  SBO Referring Physician: Wyline Copas CC:  Abdominal pain, nausea and vomiting   Brandy [name] is an 83 y.o. female.  HPI: Pt admitted 5/13 with 6 day hx of N& V.  She was found to have a ventral hernia and it was reduced in the ED.   Work up in the ED shows she is afebrile, VSS.  Creatinine was 2.50, WBC 16.5K, CT scan shows:  Three ventral, periumbilical hernias are identified containing small bowel, one to the right of the umbilicus and two to the left.  The more medial of the left-sided ventral hernias demonstrates a transition point contributing to a high-grade small bowel obstruction. A short segment of fluid-filled dilated small bowel is seen measuring up to 4.1 cm in caliber within the medial left-sided hernia and incarceration is not entirely excluded. Correlate for reducibility. She was admitted by Medicine last PM and Dr. Hassell Done was contacted.  Pt declined NG placement.  We are ask to see.  Note below shows hx of exploratory laparotomy and appendectomy in the past.  No past hx of SBO from this.    2 view film this Am consistent with some ongoing SBO.  No free air.   Past Medical History:  Diagnosis Date  . Anemia   . Arthritis    shoulders  . Asthma    mild, no inhalers used  . Breast cancer ([place]) 12/18/11   left breast masectomy=metastatic ca in (1/1) lymph node ,invasive ductal ca,2 foci,,dcis,lymph ovascular invasion identified,surgical resection margins neg for ca,additional tissue=benign skin and subcutaneous tissue  . Bronchitis    hx of;last time >22yrago  . Depression    takes Celexa daily  . Difficult intubation   . Dysphagia   . Full dentures   . Gall stones 2015  . GERD (gastroesophageal reflux disease)    takes Prilosec prn  . H/O hiatal hernia   . History of kidney stones    many yrs ago  . Hx of radiation therapy 04/26/12 -06/10/12   left breast  . Hyperlipidemia    takes Simvastatin daily  . Hypertension    takes Amlodipine and Diovan daily   . Insomnia    takes Ambien nightly  . Urinary urgency     Past Surgical History:  Procedure Laterality Date  . APPENDECTOMY    . bladder tack    . BREAST BIOPSY  1998   left   . DILATION AND CURETTAGE OF UTERUS    . ENDOSCOPIC RETROGRADE CHOLANGIOPANCREATOGRAPHY (ERCP) WITH PROPOFOL N/A 02/01/2014   Procedure: ENDOSCOPIC RETROGRADE CHOLANGIOPANCREATOGRAPHY (ERCP) WITH PROPOFOL;  Surgeon: DMilus Banister MD;  Location: [facility];  Service: Endoscopy;  Laterality: N/A;  . ESOPHAGOGASTRODUODENOSCOPY    . ESOPHAGOGASTRODUODENOSCOPY N/A 06/26/2016   Procedure: ESOPHAGOGASTRODUODENOSCOPY (EGD);  Surgeon: DMilus Banister MD;  [place] [facility]  Service: Endoscopy;  Laterality: N/A;  . ESOPHAGOGASTRODUODENOSCOPY (EGD) WITH PROPOFOL  12/19/2013   Procedure: ESOPHAGOGASTRODUODENOSCOPY (EGD) WITH PROPOFOL;  Surgeon: PBeryle Beams MD;  [place] [facility]  Service: Endoscopy;;  . ESOPHAGOGASTRODUODENOSCOPY (EGD) WITH PROPOFOL N/A 09/10/2016   Procedure: ESOPHAGOGASTRODUODENOSCOPY (EGD) WITH PROPOFOL;  Surgeon: DMilus Banister MD;  Location: [facility];  Service: Endoscopy;  Laterality: N/A;  . EUS  06/04/2011   Procedure: UPPER ENDOSCOPIC ULTRASOUND (EUS) LINEAR;  Surgeon: DOwens Loffler MD;  Location: [facility];  Service: Endoscopy;  Laterality: N/A;  . EUS N/A 02/01/2014   Procedure: UPPER ENDOSCOPIC ULTRASOUND (EUS) LINEAR;  Surgeon: DMilus Banister MD;  Location:  [facility];  Service: Endoscopy;  Laterality: N/A;  . EXPLORATORY LAPAROTOMY      biopsy of intra-abdominal mass  . MASTECTOMY W/ SENTINEL NODE BIOPSY  12/18/2011   Procedure: MASTECTOMY WITH SENTINEL LYMPH NODE BIOPSY;  Surgeon: Rolm Bookbinder, MD;  Location: [place];  Service: General;  Laterality: Left;  . PORT-A-CATH REMOVAL Right 06/15/2013   Procedure: REMOVAL PORT-A-CATH;  Surgeon: Rolm Bookbinder, MD;  Location: [place];  Service: General;  Laterality: Right;  . PORTACATH PLACEMENT   01/27/2012   Procedure: INSERTION PORT-A-CATH;  Surgeon: Rolm Bookbinder, MD;  Location: [place];  Service: General;  Laterality: Right;  PORT PLACEMENT  . TOTAL SHOULDER REPLACEMENT  2011   left  . TUBAL LIGATION      Family History  Problem Relation Age of Onset  . Prostate cancer Father   . Breast cancer Other     Social History:  reports that she has never smoked. She has never used smokeless tobacco. She reports that she does not drink alcohol or use drugs.  Allergies: No Known Allergies  Medications:  Prior to Admission:  Prescriptions Prior to Admission  Medication Sig Dispense Refill Last Dose  . acetaminophen (TYLENOL) 500 MG tablet Take 1,000 mg by mouth every 6 (six) hours as needed (pain).   11/14/2016 at Unknown time  . amLODipine (NORVASC) 10 MG tablet Take 10 mg by mouth every morning.    11/14/2016 at Unknown time  . atorvastatin (LIPITOR) 40 MG tablet Take 1 tablet by mouth daily.   11/14/2016 at Unknown time  . citalopram (CELEXA) 40 MG tablet Take 1 tablet by mouth daily.   11/14/2016 at Unknown time  . ferrous sulfate 325 (65 FE) MG tablet Take 325 mg by mouth daily with breakfast.   11/14/2016 at Unknown time  . gabapentin (NEURONTIN) 300 MG capsule Take 1 capsule (300 mg total) by mouth at bedtime. 90 capsule 3 11/14/2016 at Unknown time  . letrozole (FEMARA) 2.5 MG tablet Take 1 tablet (2.5 mg total) by mouth daily. 90 tablet 3 11/14/2016 at Unknown time  . omeprazole (PRILOSEC) 40 MG capsule Take 1 capsule (40 mg total) by mouth 2 (two) times daily before a meal. 60 capsule 11 11/14/2016 at Unknown time  . ondansetron (ZOFRAN) 4 MG tablet Take 1 tablet (4 mg total) by mouth 2 (two) times daily. 50 tablet 2 11/15/2016 at Unknown time  . spironolactone (ALDACTONE) 25 MG tablet TAKE 1/2 TABLET BY MOUTH EVERY DAY 30 tablet 4 11/14/2016 at Unknown time  . valsartan-hydrochlorothiazide (DIOVAN-HCT) 320-25 MG tablet Take 1 tablet by mouth daily.   11/14/2016 at  Unknown time  . Vitamin D, Ergocalciferol, (DRISDOL) 50000 units CAPS capsule Take 50,000 Units by mouth every 7 (seven) days.   11/12/2016    Results for orders placed or performed during the hospital encounter of 11/15/16 (from the past 48 hour(s))  Lipase, blood     Status: None   Collection Time: 11/15/16  6:50 PM  Result Value Ref Range   Lipase 24 11 - 51 U/L  Comprehensive metabolic panel     Status: Abnormal   Collection Time: 11/15/16  6:50 PM  Result Value Ref Range   Sodium 136 135 - 145 mmol/L   Potassium 4.6 3.5 - 5.1 mmol/L   Chloride 100 (L) 101 - 111 mmol/L   CO2 20 (L) 22 - 32 mmol/L   Glucose, Bld 145 (H) 65 - 99 mg/dL   BUN 60 (H) 6 -  20 mg/dL   Creatinine, Ser 2.50 (H) 0.44 - 1.00 mg/dL   Calcium 9.4 8.9 - 10.3 mg/dL   Total Protein 7.8 6.5 - 8.1 g/dL   Albumin 3.9 3.5 - 5.0 g/dL   AST 27 15 - 41 U/L   ALT 26 14 - 54 U/L   Alkaline Phosphatase 64 38 - 126 U/L   Total Bilirubin 0.8 0.3 - 1.2 mg/dL   GFR calc non Af Amer 17 (L) >60 mL/min   GFR calc Af Amer 20 (L) >60 mL/min    Comment: (NOTE) The eGFR has been calculated using the CKD EPI equation. This calculation has not been validated in all clinical situations. eGFR's persistently <60 mL/min signify possible Chronic Kidney Disease.    Anion gap 16 (H) 5 - 15  CBC     Status: Abnormal   Collection Time: 11/15/16  6:50 PM  Result Value Ref Range   WBC 16.5 (H) 4.0 - 10.5 K/uL   RBC 4.53 3.87 - 5.11 MIL/uL   Hemoglobin 13.2 12.0 - 15.0 g/dL   HCT 39.6 36.0 - 46.0 %   MCV 87.4 78.0 - 100.0 fL   MCH 29.1 26.0 - 34.0 pg   MCHC 33.3 30.0 - 36.0 g/dL   RDW 16.6 (H) 11.5 - 15.5 %   Platelets 368 150 - 400 K/uL  I-Stat CG4 Lactic Acid, ED     Status: None   Collection Time: 11/15/16  9:19 PM  Result Value Ref Range   Lactic Acid, Venous 1.72 0.5 - 1.9 mmol/L  Urinalysis, Routine w reflex microscopic     Status: Abnormal   Collection Time: 11/15/16 10:53 PM  Result Value Ref Range   Color, Urine AMBER  (A) YELLOW    Comment: BIOCHEMICALS MAY BE AFFECTED BY COLOR   APPearance CLOUDY (A) CLEAR   Specific Gravity, Urine 1.017 1.005 - 1.030   pH 5.0 5.0 - 8.0   Glucose, UA NEGATIVE NEGATIVE mg/dL   Hgb urine dipstick NEGATIVE NEGATIVE   Bilirubin Urine SMALL (A) NEGATIVE   Ketones, ur NEGATIVE NEGATIVE mg/dL   Protein, ur NEGATIVE NEGATIVE mg/dL   Nitrite NEGATIVE NEGATIVE   Leukocytes, UA NEGATIVE NEGATIVE  Basic metabolic panel     Status: Abnormal   Collection Time: 11/16/16  4:15 AM  Result Value Ref Range   Sodium 138 135 - 145 mmol/L   Potassium 4.1 3.5 - 5.1 mmol/L   Chloride 104 101 - 111 mmol/L   CO2 23 22 - 32 mmol/L   Glucose, Bld 111 (H) 65 - 99 mg/dL   BUN 67 (H) 6 - 20 mg/dL   Creatinine, Ser 2.54 (H) 0.44 - 1.00 mg/dL   Calcium 8.3 (L) 8.9 - 10.3 mg/dL   GFR calc non Af Amer 17 (L) >60 mL/min   GFR calc Af Amer 19 (L) >60 mL/min    Comment: (NOTE) The eGFR has been calculated using the CKD EPI equation. This calculation has not been validated in all clinical situations. eGFR's persistently <60 mL/min signify possible Chronic Kidney Disease.    Anion gap 11 5 - 15  CBC     Status: Abnormal   Collection Time: 11/16/16  4:15 AM  Result Value Ref Range   WBC 12.9 (H) 4.0 - 10.5 K/uL   RBC 3.90 3.87 - 5.11 MIL/uL   Hemoglobin 10.8 (L) 12.0 - 15.0 g/dL   HCT 34.4 (L) 36.0 - 46.0 %   MCV 88.2 78.0 - 100.0 fL   MCH  27.7 26.0 - 34.0 pg   MCHC 31.4 30.0 - 36.0 g/dL   RDW 16.7 (H) 11.5 - 15.5 %   Platelets 311 150 - 400 K/uL    Ct Abdomen Pelvis Wo Contrast  Result Date: 11/15/2016 CLINICAL DATA:  Generalized abdominal pain and emesis x2 weeks. EXAM: CT ABDOMEN AND PELVIS WITHOUT CONTRAST TECHNIQUE: Multidetector CT imaging of the abdomen and pelvis was performed following the standard protocol without IV contrast. COMPARISON:  12/17/2013 FINDINGS: Lower chest: Cardiomegaly with aortic atherosclerosis, mitral annular calcifications and coronary arteriosclerosis. No  pericardial effusion. There is atelectasis at the lung bases. Stable moderate-sized hiatal hernia. Hepatobiliary: Punctate calcification in the right hepatic dome consistent with the tiny granuloma or vascular calcification. No space-occupying mass of the liver noted. Gallbladder is nondistended. No gallstones are seen. No biliary dilatation is identified. Pancreas: Unremarkable. No pancreatic ductal dilatation or surrounding inflammatory changes.Normal maxilla normal Spleen: Normal in size without focal abnormality. Adrenals/Urinary Tract: Normal bilateral adrenal glands. Stable cortical and parapelvic cysts noted of both kidneys. Hyperdense exophytic lesion off the upper pole the right kidney is stable likely to represent a proteinaceous or hemorrhagic cyst, also stable in size in consistent with a benign finding given stability since 2015. No obstructive uropathy is identified. There are punctate calcifications within both kidneys which may be secondary to vascular calcifications or nephrolithiasis. Stomach/Bowel: Moderate fluid-filled distention of the stomach. Dilated fluid-filled small bowel is seen starting from the second portion of the duodenum leading up to a transition point within a left periumbilical ventral hernia, series 2, image 60. There is one right-sided periumbilical ventral hernia containing nondistended loops of ileum, a fat containing umbilical hernia, and two left-sided periumbilical ventral hernias, the larger of which demonstrates the transition point. The more medial of the left-sided hernias contains omental fat, nondistended ileum and fluid-filled distended jejunum up to 4.1 cm. A short segment of incarcerated jejunum is not entirely excluded. The more lateral left-sided hernia contains omental fat and nondistended ileum. Vascular/Lymphatic: Aortic atherosclerosis. No enlarged abdominal or pelvic lymph nodes. Reproductive: Atrophic appearance of the uterus consistent with patient's age.  No adnexal mass. Other: No abdominopelvic ascites. Musculoskeletal: Thoracolumbar spondylosis. No acute nor suspicious osseous findings. IMPRESSION: 1. Three ventral, periumbilical hernias are identified containing small bowel, one to the right of the umbilicus and two to the left. The more medial of the left-sided ventral hernias demonstrates a transition point contributing to a high-grade small bowel obstruction. A short segment of fluid-filled dilated small bowel is seen measuring up to 4.1 cm in caliber within the medial left-sided hernia and incarceration is not entirely excluded. Correlate for reducibility. 2. Stable simple and complex cysts as well as punctate bilateral nephrolithiasis of the kidneys. 3. Stable cardiomegaly with mitral annular calcifications and coronary arteriosclerosis. Moderate-sized hiatal hernia. Electronically Signed   By: Ashley Royalty M.D.   On: 11/15/2016 21:19    Review of Systems  Gastrointestinal: Positive for abdominal pain, nausea and vomiting.       No BM since Tuesday or Wed last week.  Musculoskeletal:       Chronic lower extremity pain  Neurological: Positive for tingling (both feet).  Endo/Heme/Allergies: Negative.   Psychiatric/Behavioral: Negative.   All other systems reviewed and are negative.  Blood pressure (!) 120/58, pulse 70, temperature 98.2 F (36.8 C), temperature source Oral, resp. rate 18, height '4\' 11"'  (1.499 m), weight 73 kg (160 lb 15 oz), SpO2 93 %. Physical Exam  Constitutional: She is oriented to person, place,  and time.  Elderly female in no acute distress, still having abdominal pain.    HENT:  Head: Normocephalic and atraumatic.  Mouth/Throat: No oropharyngeal exudate.  Eyes: Right eye exhibits no discharge. Left eye exhibits no discharge. No scleral icterus.  Pupils equal  Neck: Normal range of motion. Neck supple. No JVD present. No tracheal deviation present. No thyromegaly present.  Cardiovascular: Normal rate, regular rhythm  and intact distal pulses.   Murmur (Aortic stenosis) heard. Respiratory: Effort normal and breath sounds normal. No respiratory distress. She has no wheezes. She has no rales. She exhibits no tenderness.  GI: Soft. She exhibits distension. There is tenderness. There is no rebound and no guarding.  She is soft and does not have peritonitis.  Midline surgical scar with 8 cm ventral hernia left of midline below the umbilicus, that is reducible, but comes right back out.  NO nausea or vomiting here in the hospital.  She had it for 5 days prior to admit.  Musculoskeletal: She exhibits no edema or tenderness.  Lymphadenopathy:    She has no cervical adenopathy.  Neurological: She is alert and oriented to person, place, and time. No cranial nerve deficit.  Skin: Skin is warm and dry. No rash noted. No erythema. No pallor.  Psychiatric: She has a normal mood and affect. Her behavior is normal. Judgment and thought content normal.    Assessment/Plan: SBO with hx of appendectomy & exploratory laparotomy for cancer - she is not sure when Ventral hernia with incarcerated SB- it is reducible but comes right back out Acute renal failure Hypertension Hyperglycemia Hx of breast cancer Aortic stenosis.  Plan:  I think she is going to need to have the hernia repaired.  She still has SBO by xray.  She would benefit from an NG and decompression, but she is not vomiting now and does not want one  I will review with Dr. Harlow Asa.  I would get her cleared by Medicine and Cardiology.  If she starts having nausea or vomiting again she needs an NG tube and decompression.  Keep her NPO for and on bowel rest for now.     Jakorey Mcconathy 11/16/2016, 8:54 AM

## 2016-11-17 ENCOUNTER — Inpatient Hospital Stay (HOSPITAL_COMMUNITY): Payer: Medicare HMO | Admitting: Registered Nurse

## 2016-11-17 ENCOUNTER — Inpatient Hospital Stay (HOSPITAL_COMMUNITY): Payer: Medicare HMO

## 2016-11-17 ENCOUNTER — Ambulatory Visit (HOSPITAL_COMMUNITY)
Admit: 2016-11-17 | Discharge: 2016-11-17 | Disposition: A | Discharge status: Home / Self Care | Payer: Medicare HMO | Attending: Physician Assistant | Admitting: Physician Assistant

## 2016-11-17 ENCOUNTER — Encounter (HOSPITAL_COMMUNITY)
Admission: EM | Disposition: A | Discharge status: Skilled Nursing Facility | Payer: Self-pay | Source: Home / Self Care | Attending: Internal Medicine

## 2016-11-17 DIAGNOSIS — S31109A Unspecified open wound of abdominal wall, unspecified quadrant without penetration into peritoneal cavity, initial encounter: Secondary | ICD-10-CM | POA: Diagnosis not present

## 2016-11-17 DIAGNOSIS — Z4682 Encounter for fitting and adjustment of non-vascular catheter: Secondary | ICD-10-CM | POA: Diagnosis not present

## 2016-11-17 DIAGNOSIS — E785 Hyperlipidemia, unspecified: Secondary | ICD-10-CM | POA: Diagnosis not present

## 2016-11-17 DIAGNOSIS — K460 Unspecified abdominal hernia with obstruction, without gangrene: Secondary | ICD-10-CM | POA: Diagnosis not present

## 2016-11-17 DIAGNOSIS — K436 Other and unspecified ventral hernia with obstruction, without gangrene: Secondary | ICD-10-CM | POA: Diagnosis not present

## 2016-11-17 DIAGNOSIS — K56609 Unspecified intestinal obstruction, unspecified as to partial versus complete obstruction: Secondary | ICD-10-CM | POA: Diagnosis present

## 2016-11-17 DIAGNOSIS — I10 Essential (primary) hypertension: Secondary | ICD-10-CM | POA: Diagnosis not present

## 2016-11-17 DIAGNOSIS — J95821 Acute postprocedural respiratory failure: Secondary | ICD-10-CM | POA: Diagnosis not present

## 2016-11-17 DIAGNOSIS — Z9889 Other specified postprocedural states: Secondary | ICD-10-CM

## 2016-11-17 DIAGNOSIS — R0602 Shortness of breath: Secondary | ICD-10-CM

## 2016-11-17 DIAGNOSIS — K219 Gastro-esophageal reflux disease without esophagitis: Secondary | ICD-10-CM

## 2016-11-17 DIAGNOSIS — K8050 Calculus of bile duct without cholangitis or cholecystitis without obstruction: Secondary | ICD-10-CM | POA: Diagnosis not present

## 2016-11-17 DIAGNOSIS — K269 Duodenal ulcer, unspecified as acute or chronic, without hemorrhage or perforation: Secondary | ICD-10-CM | POA: Diagnosis not present

## 2016-11-17 DIAGNOSIS — R0609 Other forms of dyspnea: Secondary | ICD-10-CM | POA: Diagnosis not present

## 2016-11-17 DIAGNOSIS — K565 Intestinal adhesions [bands], unspecified as to partial versus complete obstruction: Secondary | ICD-10-CM | POA: Diagnosis not present

## 2016-11-17 DIAGNOSIS — K658 Other peritonitis: Secondary | ICD-10-CM | POA: Diagnosis not present

## 2016-11-17 DIAGNOSIS — I350 Nonrheumatic aortic (valve) stenosis: Secondary | ICD-10-CM | POA: Diagnosis not present

## 2016-11-17 DIAGNOSIS — G9341 Metabolic encephalopathy: Secondary | ICD-10-CM | POA: Diagnosis not present

## 2016-11-17 DIAGNOSIS — K430 Incisional hernia with obstruction, without gangrene: Secondary | ICD-10-CM | POA: Diagnosis not present

## 2016-11-17 DIAGNOSIS — Z01810 Encounter for preprocedural cardiovascular examination: Secondary | ICD-10-CM | POA: Diagnosis not present

## 2016-11-17 DIAGNOSIS — Z9911 Dependence on respirator [ventilator] status: Secondary | ICD-10-CM

## 2016-11-17 DIAGNOSIS — N179 Acute kidney failure, unspecified: Secondary | ICD-10-CM

## 2016-11-17 DIAGNOSIS — K5669 Other partial intestinal obstruction: Secondary | ICD-10-CM | POA: Diagnosis not present

## 2016-11-17 DIAGNOSIS — R739 Hyperglycemia, unspecified: Secondary | ICD-10-CM

## 2016-11-17 DIAGNOSIS — E43 Unspecified severe protein-calorie malnutrition: Secondary | ICD-10-CM | POA: Diagnosis not present

## 2016-11-17 DIAGNOSIS — R69 Illness, unspecified: Secondary | ICD-10-CM | POA: Diagnosis not present

## 2016-11-17 DIAGNOSIS — K659 Peritonitis, unspecified: Secondary | ICD-10-CM | POA: Diagnosis not present

## 2016-11-17 DIAGNOSIS — R109 Unspecified abdominal pain: Secondary | ICD-10-CM | POA: Diagnosis not present

## 2016-11-17 HISTORY — PX: LAPAROTOMY: SHX154

## 2016-11-17 LAB — CBC
HCT: 31.1 % — ABNORMAL LOW (ref 36.0–46.0)
Hemoglobin: 10.1 g/dL — ABNORMAL LOW (ref 12.0–15.0)
MCH: 28.9 pg (ref 26.0–34.0)
MCHC: 32.5 g/dL (ref 30.0–36.0)
MCV: 88.9 fL (ref 78.0–100.0)
Platelets: 239 10*3/uL (ref 150–400)
RBC: 3.5 MIL/uL — ABNORMAL LOW (ref 3.87–5.11)
RDW: 16.9 % — ABNORMAL HIGH (ref 11.5–15.5)
WBC: 10.1 10*3/uL (ref 4.0–10.5)

## 2016-11-17 LAB — BLOOD GAS, ARTERIAL
Acid-base deficit: 5.1 mmol/L — ABNORMAL HIGH (ref 0.0–2.0)
Bicarbonate: 20.9 mmol/L (ref 20.0–28.0)
Drawn by: 232811
FIO2: 50
LHR: 15 {breaths}/min
O2 Saturation: 97.8 %
PATIENT TEMPERATURE: 97.9
PEEP/CPAP: 5 cmH2O
PO2 ART: 142 mmHg — AB (ref 83.0–108.0)
VT: 350 mL
pCO2 arterial: 44.2 mmHg (ref 32.0–48.0)
pH, Arterial: 7.293 — ABNORMAL LOW (ref 7.350–7.450)

## 2016-11-17 LAB — NM MYOCAR MULTI W/SPECT W/WALL MOTION / EF
CHL CUP MPHR: 139 {beats}/min
CSEPED: 0 min
Estimated workload: 1 METS
Exercise duration (sec): 0 s
Peak HR: 105 {beats}/min
Percent HR: 75 %
Rest HR: 72 {beats}/min

## 2016-11-17 LAB — BASIC METABOLIC PANEL
Anion gap: 8 (ref 5–15)
BUN: 44 mg/dL — ABNORMAL HIGH (ref 6–20)
CO2: 23 mmol/L (ref 22–32)
Calcium: 8.3 mg/dL — ABNORMAL LOW (ref 8.9–10.3)
Chloride: 109 mmol/L (ref 101–111)
Creatinine, Ser: 1.03 mg/dL — ABNORMAL HIGH (ref 0.44–1.00)
GFR calc Af Amer: 57 mL/min — ABNORMAL LOW (ref >60)
GFR, EST NON AFRICAN AMERICAN: 50 mL/min — AB (ref >60)
GLUCOSE: 77 mg/dL (ref 65–99)
Potassium: 4 mmol/L (ref 3.5–5.1)
Sodium: 140 mmol/L (ref 135–145)

## 2016-11-17 LAB — MRSA PCR SCREENING: MRSA BY PCR: NEGATIVE

## 2016-11-17 SURGERY — LAPAROTOMY, EXPLORATORY
Anesthesia: General | Site: Abdomen

## 2016-11-17 MED ORDER — PHENYLEPHRINE HCL 10 MG/ML IJ SOLN
INTRAMUSCULAR | Status: DC | PRN
  Administered 2016-11-17 (×2): 80 ug via INTRAVENOUS

## 2016-11-17 MED ORDER — HYDROMORPHONE HCL 1 MG/ML IJ SOLN: 1.0000 mg | INTRAMUSCULAR | Status: DC | PRN

## 2016-11-17 MED ORDER — FENTANYL CITRATE (PF) 250 MCG/5ML IJ SOLN
INTRAMUSCULAR | Status: AC
  Filled 2016-11-17: qty 5

## 2016-11-17 MED ORDER — FENTANYL BOLUS VIA INFUSION
25.0000 ug | INTRAVENOUS | Status: DC | PRN
  Administered 2016-11-17 – 2016-11-18 (×2): 25 ug via INTRAVENOUS
  Filled 2016-11-17: qty 25

## 2016-11-17 MED ORDER — FENTANYL CITRATE (PF) 100 MCG/2ML IJ SOLN
INTRAMUSCULAR | Status: DC | PRN
  Administered 2016-11-17 (×2): 100 ug via INTRAVENOUS
  Administered 2016-11-17 (×3): 50 ug via INTRAVENOUS
  Administered 2016-11-17: 100 ug via INTRAVENOUS
  Administered 2016-11-17 (×6): 50 ug via INTRAVENOUS

## 2016-11-17 MED ORDER — CEFAZOLIN SODIUM-DEXTROSE 2-4 GM/100ML-% IV SOLN
2.0000 g | INTRAVENOUS | Status: AC
  Administered 2016-11-17: 2 g via INTRAVENOUS

## 2016-11-17 MED ORDER — ROCURONIUM BROMIDE 50 MG/5ML IV SOSY
PREFILLED_SYRINGE | INTRAVENOUS | Status: AC
  Filled 2016-11-17: qty 5

## 2016-11-17 MED ORDER — FAMOTIDINE IN NACL 20-0.9 MG/50ML-% IV SOLN
20.0000 mg | INTRAVENOUS | Status: DC
  Administered 2016-11-18 – 2016-11-24 (×8): 20 mg via INTRAVENOUS
  Filled 2016-11-17 (×8): qty 50

## 2016-11-17 MED ORDER — FENTANYL BOLUS VIA INFUSION: 25.0000 ug | INTRAVENOUS | Status: DC | PRN

## 2016-11-17 MED ORDER — MIDAZOLAM HCL 2 MG/2ML IJ SOLN
1.0000 mg | INTRAMUSCULAR | Status: DC | PRN
  Administered 2016-11-18: 1 mg via INTRAVENOUS

## 2016-11-17 MED ORDER — FENTANYL CITRATE (PF) 2500 MCG/50ML IJ SOLN
0.0000 ug/h | INTRAMUSCULAR | Status: DC
  Administered 2016-11-17: 50 ug/h via INTRAVENOUS
  Filled 2016-11-17: qty 50

## 2016-11-17 MED ORDER — TECHNETIUM TC 99M TETROFOSMIN IV KIT
10.0000 | PACK | Freq: Once | INTRAVENOUS | Status: AC | PRN
  Administered 2016-11-17: 10 via INTRAVENOUS

## 2016-11-17 MED ORDER — HYDROMORPHONE HCL 1 MG/ML IJ SOLN
INTRAMUSCULAR | Status: DC | PRN
  Administered 2016-11-17 (×2): 1 mg via INTRAVENOUS

## 2016-11-17 MED ORDER — MIDAZOLAM HCL 5 MG/5ML IJ SOLN
INTRAMUSCULAR | Status: DC | PRN
  Administered 2016-11-17 (×2): 1 mg via INTRAVENOUS

## 2016-11-17 MED ORDER — PROPOFOL 10 MG/ML IV BOLUS
INTRAVENOUS | Status: AC
  Filled 2016-11-17: qty 20

## 2016-11-17 MED ORDER — LIDOCAINE 2% (20 MG/ML) 5 ML SYRINGE
INTRAMUSCULAR | Status: AC
  Filled 2016-11-17: qty 5

## 2016-11-17 MED ORDER — DEXAMETHASONE SODIUM PHOSPHATE 10 MG/ML IJ SOLN
INTRAMUSCULAR | Status: DC | PRN
  Administered 2016-11-17: 10 mg via INTRAVENOUS

## 2016-11-17 MED ORDER — ACETAMINOPHEN 10 MG/ML IV SOLN
INTRAVENOUS | Status: DC | PRN
  Administered 2016-11-17: 1000 mg via INTRAVENOUS

## 2016-11-17 MED ORDER — ONDANSETRON HCL 4 MG/2ML IJ SOLN
INTRAMUSCULAR | Status: AC
  Filled 2016-11-17: qty 2

## 2016-11-17 MED ORDER — ORAL CARE MOUTH RINSE
15.0000 mL | Freq: Four times a day (QID) | OROMUCOSAL | Status: DC
  Administered 2016-11-18 (×2): 15 mL via OROMUCOSAL

## 2016-11-17 MED ORDER — REGADENOSON 0.4 MG/5ML IV SOLN
0.4000 mg | Freq: Once | INTRAVENOUS | Status: AC
  Administered 2016-11-17: 0.4 mg via INTRAVENOUS

## 2016-11-17 MED ORDER — FENTANYL CITRATE (PF) 100 MCG/2ML IJ SOLN: 50.0000 ug | Freq: Once | INTRAMUSCULAR | Status: DC

## 2016-11-17 MED ORDER — ONDANSETRON 4 MG PO TBDP: 4.0000 mg | ORAL_TABLET | Freq: Four times a day (QID) | ORAL | Status: DC | PRN

## 2016-11-17 MED ORDER — MIDAZOLAM HCL 2 MG/2ML IJ SOLN
1.0000 mg | INTRAMUSCULAR | Status: DC | PRN
  Filled 2016-11-17: qty 2

## 2016-11-17 MED ORDER — MIDAZOLAM HCL 2 MG/2ML IJ SOLN
INTRAMUSCULAR | Status: AC
Start: 2016-11-17 — End: 2016-11-17
  Filled 2016-11-17: qty 2

## 2016-11-17 MED ORDER — SUCCINYLCHOLINE CHLORIDE 20 MG/ML IJ SOLN
INTRAMUSCULAR | Status: DC | PRN
  Administered 2016-11-17: 100 mg via INTRAVENOUS

## 2016-11-17 MED ORDER — SUCCINYLCHOLINE CHLORIDE 200 MG/10ML IV SOSY
PREFILLED_SYRINGE | INTRAVENOUS | Status: AC
  Filled 2016-11-17: qty 10

## 2016-11-17 MED ORDER — HYDROMORPHONE HCL 2 MG/ML IJ SOLN
INTRAMUSCULAR | Status: AC
  Filled 2016-11-17: qty 1

## 2016-11-17 MED ORDER — ONDANSETRON HCL 4 MG/2ML IJ SOLN: 4.0000 mg | Freq: Once | INTRAMUSCULAR | Status: DC | PRN

## 2016-11-17 MED ORDER — CEFAZOLIN SODIUM-DEXTROSE 2-4 GM/100ML-% IV SOLN
INTRAVENOUS | Status: AC
  Filled 2016-11-17: qty 100

## 2016-11-17 MED ORDER — LIDOCAINE HCL (CARDIAC) 20 MG/ML IV SOLN
INTRAVENOUS | Status: DC | PRN
  Administered 2016-11-17: 25 mg via INTRATRACHEAL
  Administered 2016-11-17: 75 mg via INTRAVENOUS

## 2016-11-17 MED ORDER — CHLORHEXIDINE GLUCONATE 0.12% ORAL RINSE (MEDLINE KIT)
15.0000 mL | Freq: Two times a day (BID) | OROMUCOSAL | Status: DC
  Administered 2016-11-17 – 2016-11-18 (×2): 15 mL via OROMUCOSAL

## 2016-11-17 MED ORDER — ETOMIDATE 2 MG/ML IV SOLN
INTRAVENOUS | Status: DC | PRN
  Administered 2016-11-17: 8 mg via INTRAVENOUS

## 2016-11-17 MED ORDER — FENTANYL CITRATE (PF) 100 MCG/2ML IJ SOLN
25.0000 ug | INTRAMUSCULAR | Status: DC | PRN
Start: 2016-11-17 — End: 2016-11-17

## 2016-11-17 MED ORDER — REGADENOSON 0.4 MG/5ML IV SOLN
INTRAVENOUS | Status: AC
  Filled 2016-11-17: qty 5

## 2016-11-17 MED ORDER — LACTATED RINGERS IV SOLN
INTRAVENOUS | Status: DC | PRN
  Administered 2016-11-17 (×2): via INTRAVENOUS

## 2016-11-17 MED ORDER — TECHNETIUM TC 99M TETROFOSMIN IV KIT
30.0000 | PACK | Freq: Once | INTRAVENOUS | Status: AC | PRN
  Administered 2016-11-17: 30 via INTRAVENOUS

## 2016-11-17 MED ORDER — IPRATROPIUM-ALBUTEROL 0.5-2.5 (3) MG/3ML IN SOLN: 3.0000 mL | RESPIRATORY_TRACT | Status: DC | PRN

## 2016-11-17 MED ORDER — KCL IN DEXTROSE-NACL 20-5-0.45 MEQ/L-%-% IV SOLN
INTRAVENOUS | Status: DC
  Administered 2016-11-17: 20:00:00 via INTRAVENOUS
  Filled 2016-11-17: qty 1000

## 2016-11-17 MED ORDER — ONDANSETRON HCL 4 MG/2ML IJ SOLN: 4.0000 mg | Freq: Four times a day (QID) | INTRAMUSCULAR | Status: DC | PRN

## 2016-11-17 MED ORDER — SODIUM CHLORIDE 0.9 % IR SOLN
Status: DC | PRN
  Administered 2016-11-17: 3000 mL

## 2016-11-17 MED ORDER — PROPOFOL 10 MG/ML IV BOLUS
INTRAVENOUS | Status: DC | PRN
  Administered 2016-11-17 (×2): 30 mg via INTRAVENOUS

## 2016-11-17 MED ORDER — ROCURONIUM BROMIDE 100 MG/10ML IV SOLN
INTRAVENOUS | Status: DC | PRN
  Administered 2016-11-17: 40 mg via INTRAVENOUS
  Administered 2016-11-17: 10 mg via INTRAVENOUS
  Administered 2016-11-17: 50 mg via INTRAVENOUS

## 2016-11-17 MED ORDER — SODIUM CHLORIDE 0.9 % IJ SOLN
INTRAMUSCULAR | Status: AC
  Filled 2016-11-17: qty 20

## 2016-11-17 MED ORDER — SODIUM CHLORIDE 0.9 % IV SOLN
1.0000 g | INTRAVENOUS | Status: DC
  Administered 2016-11-17 – 2016-11-21 (×5): 1 g via INTRAVENOUS
  Filled 2016-11-17 (×5): qty 1

## 2016-11-17 MED ORDER — ACETAMINOPHEN 10 MG/ML IV SOLN
INTRAVENOUS | Status: AC
  Filled 2016-11-17: qty 100

## 2016-11-17 SURGICAL SUPPLY — 45 items
APPLICATOR COTTON TIP 6IN STRL (MISCELLANEOUS) ×2 IMPLANT
BLADE EXTENDED COATED 6.5IN (ELECTRODE) IMPLANT
BLADE HEX COATED 2.75 (ELECTRODE) ×2 IMPLANT
CHLORAPREP W/TINT 26ML (MISCELLANEOUS) ×2 IMPLANT
COVER MAYO STAND STRL (DRAPES) ×2 IMPLANT
COVER SURGICAL LIGHT HANDLE (MISCELLANEOUS) ×2 IMPLANT
DRAPE LAPAROSCOPIC ABDOMINAL (DRAPES) ×2 IMPLANT
DRAPE WARM FLUID 44X44 (DRAPE) ×2 IMPLANT
ELECT REM PT RETURN 15FT ADLT (MISCELLANEOUS) ×2 IMPLANT
GAUZE SPONGE 4X4 12PLY STRL (GAUZE/BANDAGES/DRESSINGS) ×2 IMPLANT
GLOVE BIO SURGEON STRL SZ7 (GLOVE) ×4 IMPLANT
GLOVE BIOGEL PI IND STRL 7.0 (GLOVE) ×1 IMPLANT
GLOVE BIOGEL PI IND STRL 7.5 (GLOVE) ×2 IMPLANT
GLOVE BIOGEL PI INDICATOR 7.0 (GLOVE) ×1
GLOVE BIOGEL PI INDICATOR 7.5 (GLOVE) ×2
GLOVE SURG ORTHO 8.0 STRL STRW (GLOVE) ×2 IMPLANT
GLOVE SURG SS PI 7.0 STRL IVOR (GLOVE) ×2 IMPLANT
GOWN STRL REUS W/ TWL XL LVL3 (GOWN DISPOSABLE) ×1 IMPLANT
GOWN STRL REUS W/TWL XL LVL3 (GOWN DISPOSABLE) ×5 IMPLANT
HANDLE SUCTION POOLE (INSTRUMENTS) ×1 IMPLANT
KIT BASIN OR (CUSTOM PROCEDURE TRAY) ×2 IMPLANT
LIGASURE IMPACT 36 18CM CVD LR (INSTRUMENTS) ×2 IMPLANT
PACK GENERAL/GYN (CUSTOM PROCEDURE TRAY) ×2 IMPLANT
RELOAD PROXIMATE 75MM BLUE (ENDOMECHANICALS) ×4 IMPLANT
RETAINER VISCERA MED (MISCELLANEOUS) ×2 IMPLANT
SPONGE ABDOMINAL VAC ABTHERA (MISCELLANEOUS) ×2 IMPLANT
SPONGE LAP 18X18 X RAY DECT (DISPOSABLE) ×2 IMPLANT
STAPLER GUN LINEAR PROX 60 (STAPLE) ×2 IMPLANT
STAPLER PROXIMATE 75MM BLUE (STAPLE) ×2 IMPLANT
STAPLER VISISTAT 35W (STAPLE) ×2 IMPLANT
SUCTION POOLE HANDLE (INSTRUMENTS) ×2
SUT NOV 1 T60/GS (SUTURE) IMPLANT
SUT NOVA 1 T20/GS 25DT (SUTURE) ×10 IMPLANT
SUT SILK 2 0 (SUTURE)
SUT SILK 2 0 SH CR/8 (SUTURE) IMPLANT
SUT SILK 2-0 18XBRD TIE 12 (SUTURE) IMPLANT
SUT SILK 3 0 (SUTURE)
SUT SILK 3 0 SH CR/8 (SUTURE) ×2 IMPLANT
SUT SILK 3-0 18XBRD TIE 12 (SUTURE) IMPLANT
SUT VICRYL 2 0 18  UND BR (SUTURE)
SUT VICRYL 2 0 18 UND BR (SUTURE) IMPLANT
TISSUE MATRIX STRATTICE 16X20 (Mesh General) ×2 IMPLANT
TOWEL OR 17X26 10 PK STRL BLUE (TOWEL DISPOSABLE) ×4 IMPLANT
TRAY FOLEY CATH SILVER 14FR (SET/KITS/TRAYS/PACK) ×2 IMPLANT
YANKAUER SUCT BULB TIP NO VENT (SUCTIONS) IMPLANT

## 2016-11-17 NOTE — Anesthesia Procedure Notes (Signed)
Procedure Name: Intubation Date/Time: 11/17/2016 2:48 PM Performed by: Lissa Morales Pre-anesthesia Checklist: Patient identified, Emergency Drugs available, Suction available and Patient being monitored Patient Re-evaluated:Patient Re-evaluated prior to inductionOxygen Delivery Method: Circle system utilized Preoxygenation: Pre-oxygenation with 100% oxygen Intubation Type: IV induction Ventilation: Mask ventilation without difficulty Laryngoscope Size: Mac and 4 Grade View: Grade I Tube type: Oral Tube size: 7.5 mm Number of attempts: 1 Airway Equipment and Method: Stylet and Oral airway Placement Confirmation: ETT inserted through vocal cords under direct vision,  positive ETCO2 and breath sounds checked- equal and bilateral Secured at: 21 cm Tube secured with: Tape Dental Injury: Teeth and Oropharynx as per pre-operative assessment

## 2016-11-17 NOTE — Consult Note (Signed)
PULMONARY / CRITICAL CARE MEDICINE   Name: Brandy [name] MRN: [id number] DOB: [birth year]    ADMISSION DATE:  11/15/2016 CONSULTATION DATE:  11/17/16  REFERRING MD:  Harlow Asa  CHIEF COMPLAINT:  Post op vent management  HISTORY OF PRESENT ILLNESS:  Pt is encephelopathic; therefore, this HPI is obtained from chart review. Brandy [name] is a 83 y.o. female with PMH as outlined below.  She presented to [facility] 5/13 with N/V that began 1 week prior.  Also had some pain around her ventral hernia site.  Had CT that demonstrated incarcerated small bowel in the left ventral hernia.  This was reduced somewhat in the ED.  She was evaluated by CCS who then took her to the OR 5/15 for ex lap with lysis of adhesions, small bowel resection, and repair of incarcerated hernia with insertion of biologic mesh patch.  She had wound vac placed and returned to the ICU on the ventilator.  PCCM was called for vent management.   PAST MEDICAL HISTORY :  She  has a past medical history of Anemia; Arthritis; Asthma; Breast cancer ([place]) (12/18/11); Bronchitis; Depression; Difficult intubation; Dysphagia; Full dentures; Gall stones (2015); GERD (gastroesophageal reflux disease); H/O hiatal hernia; History of kidney stones; radiation therapy (04/26/12 -06/10/12); Hyperlipidemia; Hypertension; Insomnia; and Urinary urgency.  PAST SURGICAL HISTORY: She  has a past surgical history that includes Total shoulder replacement (2011); EUS (06/04/2011); Exploratory laparotomy; bladder tack; Tubal ligation; Appendectomy; Dilation and curettage of uterus; Esophagogastroduodenoscopy; Mastectomy w/ sentinel node biopsy (12/18/2011); Portacath placement (01/27/2012); Port-a-cath removal (Right, 06/15/2013); Esophagogastroduodenoscopy (egd) with propofol (12/19/2013); Breast biopsy (1998); EUS (N/A, 02/01/2014); Endoscopic retrograde cholangiopancreatography (ercp) with propofol (N/A, 02/01/2014); Esophagogastroduodenoscopy (N/A, 06/26/2016); and  Esophagogastroduodenoscopy (egd) with propofol (N/A, 09/10/2016).  No Known Allergies  Current Facility-Administered Medications on File Prior to Encounter  Medication  . sodium chloride 0.9 % injection 10 mL   Current Outpatient Prescriptions on File Prior to Encounter  Medication Sig  . amLODipine (NORVASC) 10 MG tablet Take 10 mg by mouth every morning.   Marland Kitchen atorvastatin (LIPITOR) 40 MG tablet Take 1 tablet by mouth daily.  . citalopram (CELEXA) 40 MG tablet Take 1 tablet by mouth daily.  . ferrous sulfate 325 (65 FE) MG tablet Take 325 mg by mouth daily with breakfast.  . gabapentin (NEURONTIN) 300 MG capsule Take 1 capsule (300 mg total) by mouth at bedtime.  Marland Kitchen letrozole (FEMARA) 2.5 MG tablet Take 1 tablet (2.5 mg total) by mouth daily.  Marland Kitchen omeprazole (PRILOSEC) 40 MG capsule Take 1 capsule (40 mg total) by mouth 2 (two) times daily before a meal.  . ondansetron (ZOFRAN) 4 MG tablet Take 1 tablet (4 mg total) by mouth 2 (two) times daily.  Marland Kitchen spironolactone (ALDACTONE) 25 MG tablet TAKE 1/2 TABLET BY MOUTH EVERY DAY  . valsartan-hydrochlorothiazide (DIOVAN-HCT) 320-25 MG tablet Take 1 tablet by mouth daily.  . Vitamin D, Ergocalciferol, (DRISDOL) 50000 units CAPS capsule Take 50,000 Units by mouth every 7 (seven) days.    FAMILY HISTORY:  Her indicated that her mother is deceased. She indicated that her father is deceased. She indicated that her maternal grandmother is deceased. She indicated that her maternal grandfather is deceased. She indicated that her paternal grandmother is deceased. She indicated that her paternal grandfather is deceased. She indicated that her other is alive.    SOCIAL HISTORY: She  reports that she has never smoked. She has never used smokeless tobacco. She reports that she does not drink alcohol or use  drugs.  REVIEW OF SYSTEMS:   Unable to obtain as pt is encephalopathic.  SUBJECTIVE:  On vent, unresponsive.  VITAL SIGNS: BP (!) 141/49   Pulse 100    Temp 98.4 F (36.9 C) (Oral)   Resp 17   Ht 4\' 11"  (1.499 m)   Wt 73 kg (160 lb 15 oz)   SpO2 100%   BMI 32.51 kg/m   HEMODYNAMICS:    VENTILATOR SETTINGS: Vent Mode: PRVC FiO2 (%):  [50 %] 50 % Set Rate:  [15 bmp] 15 bmp Vt Set:  [440 mL] 440 mL PEEP:  [5 cmH20] 5 cmH20 Plateau Pressure:  [18 cmH20] 18 cmH20  INTAKE / OUTPUT: I/O last 3 completed shifts: In: 4075 [I.V.:4075] Out: 2350 [Urine:2200; Blood:150]   PHYSICAL EXAMINATION: General: Elderly female, in NAD. Neuro: Sedated, not responsive. HEENT: [place]/AT. PERRL, sclerae anicteric.  ETT in place. Cardiovascular: RRR, 3/6 SEM. Lungs: Respirations even and unlabored.  CTA bilaterally, No W/R/R.  Abdomen: Abdominal incision noted with wound vac in place. Musculoskeletal: No gross deformities, no edema.  Skin: Intact, warm, no rashes.  LABS:  BMET  Recent Labs Lab 11/15/16 1850 11/16/16 0415 11/17/16 0418  NA 136 138 140  K 4.6 4.1 4.0  CL 100* 104 109  CO2 20* 23 23  BUN 60* 67* 44*  CREATININE 2.50* 2.54* 1.03*  GLUCOSE 145* 111* 77    Electrolytes  Recent Labs Lab 11/15/16 1850 11/16/16 0415 11/17/16 0418  CALCIUM 9.4 8.3* 8.3*    CBC  Recent Labs Lab 11/15/16 1850 11/16/16 0415 11/17/16 0418  WBC 16.5* 12.9* 10.1  HGB 13.2 10.8* 10.1*  HCT 39.6 34.4* 31.1*  PLT 368 311 239    Coag's No results for input(s): APTT, INR in the last 168 hours.  Sepsis Markers  Recent Labs Lab 11/15/16 2119  LATICACIDVEN 1.72    ABG No results for input(s): PHART, PCO2ART, PO2ART in the last 168 hours.  Liver Enzymes  Recent Labs Lab 11/15/16 1850  AST 27  ALT 26  ALKPHOS 64  BILITOT 0.8  ALBUMIN 3.9    Cardiac Enzymes No results for input(s): TROPONINI, PROBNP in the last 168 hours.  Glucose No results for input(s): GLUCAP in the last 168 hours.  Imaging Nm Myocar Multi W/spect W/wall Motion / Ef  Result Date: 11/17/2016 CLINICAL DATA:  Hypertension, asthma, abdominal  pain. Preop for surgery. EXAM: MYOCARDIAL IMAGING WITH SPECT (REST AND PHARMACOLOGIC-STRESS) GATED LEFT VENTRICULAR WALL MOTION STUDY LEFT VENTRICULAR EJECTION FRACTION TECHNIQUE: Standard myocardial SPECT imaging was performed after resting intravenous injection of 10 mCi Tc-4m tetrofosmin. Subsequently, intravenous infusion of Lexiscan was performed under the supervision of the Cardiology staff. At peak effect of the drug, 30 mCi Tc-72m tetrofosmin was injected intravenously and standard myocardial SPECT imaging was performed. Quantitative gated imaging was also performed to evaluate left ventricular wall motion, and estimate left ventricular ejection fraction. COMPARISON:  None. FINDINGS: Perfusion: Decreased activity in the inferior wall on both sets of images likely due to scar. No reversible defects to suggest ischemia. Wall Motion: Decrease thickening and activity in the inferior wall. The remainder the left ventricle demonstrates normal wall motion with an myocardial thickening with contraction. Left Ventricular Ejection Fraction: 71 % End diastolic volume 66 ml End systolic volume 19 ml IMPRESSION: 1. Suspect inferior wall scar/infarction. No findings for ischemia. 2. Normal left ventricular wall motion except for the inferior wall. 3. Left ventricular ejection fraction 71% 4. Non invasive risk stratification*: Intermediate *2012 Appropriate Use Criteria for  Coronary Revascularization Focused Update: J Am Coll Cardiol. 1610;96(0):454-098. http://content.[email or web address]?[email or web address] Electronically Signed   By: Marijo Sanes M.D.   On: 11/17/2016 12:28     STUDIES:  CT A / P 5/15 > 3 ventral / periumbilical hernias containing small bowel, incarceration is not entirely excluded, high grade SBO. Nuc Med 5/15 > no ischemia, LVEF 71%.  CULTURES: None.  ANTIBIOTICS: Ancef 5/15 x 1 Ertapenem 5/15 >   SIGNIFICANT EVENTS: 5/13 > admit. 5/15 > to OR  LINES/TUBES: ETT 5/15 >  R IJ  CVL 5/15 >   DISCUSSION: 83 y.o. female admitted 5/13 with SBO and incarcerated hernia, taken to OR 5/15 for ex-lap with lysis of adhesions, small bowel resection, and repair of incarcerated hernia with insertion of biologic mesh patch.  Following OR, she returned to the ICU on the vent and PCCM was asked to see for vent management.  ASSESSMENT / PLAN:  GASTROINTESTINAL A:   SBO with incarcerated hernia - s/p ex lap with lysis of adhesions, small bowel resection, and repair of incarcerated hernia with insertion of biologic mesh patch 5/15 . GERD. Nutrition. Hx dysphagia. P:   Post op care per CCS. SUP: Pantoprazole. NPO.  PULMONARY A: Respiratory insufficiency - not extubated post op due to plans for return trip to OR. Hx asthma. P:   Full vent support. Wean as able. VAP prevention measures. Hold SBT until touch base with surgery and find out plans for future OR trips. CXR in AM.  CARDIOVASCULAR A:  Hx HTN, HLD, AS. DNR Status. P:  Monitor hemodynamics. Hold preadmission amlodipine, spironolactone, diovan.  RENAL A:   AKI. P:   Correct electrolytes as indicated. BMP in AM.  HEMATOLOGIC / ONCOLOGIC A:   VTE Prophylaxis. Hx radiation therapy to left breast. P:  SCD's. CBC in AM.  INFECTIOUS A:   Peritonitis. P:   Abx as above (ertapenem).   ENDOCRINE A:   Hyperglycemia - no hx DM. P:   SSI if glucose consistently > 180.  NEUROLOGIC A:   Acute encephalopathy - due to sedation. Hx depression. P:   Sedation:  Fentanyl gtt / Midazolam PRN. RASS goal: 0 to -1. Daily WUA. Hold preadmission citalopram, gabapentin.  Family updated: None available.  Interdisciplinary Family Meeting v Palliative Care Meeting:  Due by: 11/23/16.  CC time: 30 min.   Montey Hora, [place] [facility] Pager: [phone]  or [phone] 11/17/2016, 7:41 PM

## 2016-11-17 NOTE — Brief Op Note (Signed)
11/15/2016 - 11/17/2016  5:43 PM  PATIENT:  Brandy [name]  83 y.o. female  PRE-OPERATIVE DIAGNOSIS:  INCARCERATED VENTRAL INCISIONAL HERNIA, ADHESIONS, Small bowel obstruction  POST-OPERATIVE DIAGNOSIS:  same  PROCEDURE:  Procedure(s): EXPLORATORY LAPAROTOMY WITH LYSIS OF ADHESIONS, SMALL BOWEL RESECTION, AND REPAIR OF INCARCERATED HERNIA (N/A) , INSERTION OF BIOLOGIC MESH PATCH  SURGEON:  Surgeon(s) and Role:    * Armandina Gemma, MD - Primary  ANESTHESIA:   general  EBL:  Total I/O In: 1000 [I.V.:1000] Out: 350 [Urine:200; Blood:150]  BLOOD ADMINISTERED:none  DRAINS: VAC DRESSING TO SUCTION   LOCAL MEDICATIONS USED:  NONE  SPECIMEN:  Segment of small bowel  DISPOSITION OF SPECIMEN:  PATHOLOGY  COUNTS:  YES  TOURNIQUET:  * No tourniquets in log *  DICTATION: .Other Dictation: Dictation Number [id number]  PLAN OF CARE: Admit to inpatient   PATIENT DISPOSITION:  ICU - intubated and critically ill.   Delay start of Pharmacological VTE agent (>24hrs) due to surgical blood loss or risk of bleeding: yes  Earnstine Regal, MD, [facility] Office: [phone]

## 2016-11-17 NOTE — Transfer of Care (Signed)
Immediate Anesthesia Transfer of Care Note  Patient: Brandy [name]  Procedure(s) Performed: Procedure(s): EXPLORATORY LAPAROTOMY WITH LYSIS OF ADHESIONS, SMALL BOWEL RESECTION, REPAIR OF INCARCERATED VENTRAL INCISIONAL HERNIA, INSERTION OF BIOLOGIC MESH PATCH,APPLICATION OF WOUND VAC DRESSING (N/A)  Patient Location: ICU  Anesthesia Type:General  Level of Consciousness: Patient remains intubated per anesthesia plan  Airway & Oxygen Therapy: Patient remains intubated per anesthesia plan  Post-op Assessment: Report given to RN, Post -op Vital signs reviewed and stable and Patient moving all extremities  Post vital signs: stable  Last Vitals:  Vitals:   11/17/16 0926 11/17/16 0928  BP: (!) 137/46 (!) 141/49  Pulse: (!) 101 100  Resp:    Temp:      Last Pain:  Vitals:   11/17/16 1300  TempSrc:   PainSc: 0-No pain      Patients Stated Pain Goal: 0 (99/69/24 9324)  Complications: No apparent anesthesia complications

## 2016-11-17 NOTE — Anesthesia Procedure Notes (Signed)
Central Venous Catheter Insertion Performed by: Roberts Gaudy, anesthesiologist Start/End5/15/2018 6:25 PM, 11/17/2016 6:30 AM Patient location: OR. Preanesthetic checklist: patient identified, IV checked, site marked, surgical consent, monitors and equipment checked, pre-op evaluation and timeout performed Position: supine Hand hygiene performed , maximum sterile barriers used  and Seldinger technique used Catheter size: 8 Fr Total catheter length 17. Central line was placed.Double lumen Procedure performed without using ultrasound guided technique. Ultrasound Notes:anatomy identified, needle tip was noted to be adjacent to the nerve/plexus identified and no ultrasound evidence of intravascular and/or intraneural injection Attempts: 1 Following insertion, line sutured, dressing applied and Biopatch. Post procedure assessment: blood return through all ports  Patient tolerated the procedure well with no immediate complications.

## 2016-11-17 NOTE — Progress Notes (Signed)
Progress Note  Patient Name: Brandy [name] Date of Encounter: 11/17/2016  Primary Cardiologist: Liane Comber, MD   Subjective   No chest pain of sob.  Some diffuse abd discomfort but overall reasonably comfortable.  For myoview today and hopefully surgery this afternoon.  Inpatient Medications    Scheduled Meds:  Continuous Infusions: . lactated ringers 125 mL/hr at 11/16/16 1842   PRN Meds: acetaminophen **OR** acetaminophen, morphine injection, ondansetron **OR** ondansetron (ZOFRAN) IV   Vital Signs    Vitals:   11/17/16 0540 11/17/16 0922 11/17/16 0924 11/17/16 0926  BP: (!) 129/50 (!) 154/59 (!) 130/44 (!) 137/46  Pulse: 66 72 100 (!) 101  Resp: 17     Temp: 98.4 F (36.9 C)     TempSrc: Oral     SpO2: 95%     Weight:      Height:        Intake/Output Summary (Last 24 hours) at 11/17/16 0928 Last data filed at 11/17/16 0600  Gross per 24 hour  Intake             1375 ml  Output             2000 ml  Net             -625 ml   Filed Weights   11/16/16 0019  Weight: 160 lb 15 oz (73 kg)    Physical Exam   GEN: Well nourished, well developed, in no acute distress.  HEENT: Grossly normal.  Neck: Supple, no JVD, carotid bruits, or masses. Cardiac: RRR, no murmurs, rubs, or gallops. No clubbing, cyanosis, edema.  Radials/DP/PT 2+ and equal bilaterally.  Respiratory:  Respirations regular and unlabored, diminished breath sounds bilaterally. GI: Soft, diffusely tender, nondistended, BS + x 4. MS: no deformity or atrophy. Skin: warm and dry, no rash. Neuro:  Strength and sensation are intact. Psych: AAOx3.  Normal affect.  Labs    Chemistry Recent Labs Lab 11/15/16 1850 11/16/16 0415 11/17/16 0418  NA 136 138 140  K 4.6 4.1 4.0  CL 100* 104 109  CO2 20* 23 23  GLUCOSE 145* 111* 77  BUN 60* 67* 44*  CREATININE 2.50* 2.54* 1.03*  CALCIUM 9.4 8.3* 8.3*  PROT 7.8  --   --   ALBUMIN 3.9  --   --   AST 27  --   --   ALT 26  --   --   ALKPHOS 64   --   --   BILITOT 0.8  --   --   GFRNONAA 17* 17* 50*  GFRAA 20* 19* 57*  ANIONGAP 16* 11 8     Hematology Recent Labs Lab 11/15/16 1850 11/16/16 0415 11/17/16 0418  WBC 16.5* 12.9* 10.1  RBC 4.53 3.90 3.50*  HGB 13.2 10.8* 10.1*  HCT 39.6 34.4* 31.1*  MCV 87.4 88.2 88.9  MCH 29.1 27.7 28.9  MCHC 33.3 31.4 32.5  RDW 16.6* 16.7* 16.9*  PLT 368 311 239     Radiology    Ct Abdomen Pelvis Wo Contrast  Result Date: 11/15/2016 CLINICAL DATA:  Generalized abdominal pain and emesis x2 weeks. EXAM: CT ABDOMEN AND PELVIS WITHOUT CONTRAST TECHNIQUE: Multidetector CT imaging of the abdomen and pelvis was performed following the standard protocol without IV contrast. COMPARISON:  12/17/2013 FINDINGS: Lower chest: Cardiomegaly with aortic atherosclerosis, mitral annular calcifications and coronary arteriosclerosis. No pericardial effusion. There is atelectasis at the lung bases. Stable moderate-sized hiatal hernia. Hepatobiliary: Punctate calcification in the  right hepatic dome consistent with the tiny granuloma or vascular calcification. No space-occupying mass of the liver noted. Gallbladder is nondistended. No gallstones are seen. No biliary dilatation is identified. Pancreas: Unremarkable. No pancreatic ductal dilatation or surrounding inflammatory changes.Normal maxilla normal Spleen: Normal in size without focal abnormality. Adrenals/Urinary Tract: Normal bilateral adrenal glands. Stable cortical and parapelvic cysts noted of both kidneys. Hyperdense exophytic lesion off the upper pole the right kidney is stable likely to represent a proteinaceous or hemorrhagic cyst, also stable in size in consistent with a benign finding given stability since 2015. No obstructive uropathy is identified. There are punctate calcifications within both kidneys which may be secondary to vascular calcifications or nephrolithiasis. Stomach/Bowel: Moderate fluid-filled distention of the stomach. Dilated fluid-filled  small bowel is seen starting from the second portion of the duodenum leading up to a transition point within a left periumbilical ventral hernia, series 2, image 60. There is one right-sided periumbilical ventral hernia containing nondistended loops of ileum, a fat containing umbilical hernia, and two left-sided periumbilical ventral hernias, the larger of which demonstrates the transition point. The more medial of the left-sided hernias contains omental fat, nondistended ileum and fluid-filled distended jejunum up to 4.1 cm. A short segment of incarcerated jejunum is not entirely excluded. The more lateral left-sided hernia contains omental fat and nondistended ileum. Vascular/Lymphatic: Aortic atherosclerosis. No enlarged abdominal or pelvic lymph nodes. Reproductive: Atrophic appearance of the uterus consistent with patient's age. No adnexal mass. Other: No abdominopelvic ascites. Musculoskeletal: Thoracolumbar spondylosis. No acute nor suspicious osseous findings. IMPRESSION: 1. Three ventral, periumbilical hernias are identified containing small bowel, one to the right of the umbilicus and two to the left. The more medial of the left-sided ventral hernias demonstrates a transition point contributing to a high-grade small bowel obstruction. A short segment of fluid-filled dilated small bowel is seen measuring up to 4.1 cm in caliber within the medial left-sided hernia and incarceration is not entirely excluded. Correlate for reducibility. 2. Stable simple and complex cysts as well as punctate bilateral nephrolithiasis of the kidneys. 3. Stable cardiomegaly with mitral annular calcifications and coronary arteriosclerosis. Moderate-sized hiatal hernia. Electronically Signed   By: Ashley Royalty M.D.   On: 11/15/2016 21:19   Dg Chest Port 1 View  Result Date: 11/16/2016 CLINICAL DATA:  Shortness of breath. EXAM: PORTABLE CHEST 1 VIEW COMPARISON:  08/21/2015. FINDINGS: Trachea is midline. Heart size stable. Lungs  are clear. No pleural fluid. Degenerative changes in the right shoulder. Left shoulder arthroplasty is partially imaged. IMPRESSION: No acute findings. Electronically Signed   By: Lorin Picket M.D.   On: 11/16/2016 12:17   Dg Abd 2 Views  Result Date: 11/16/2016 CLINICAL DATA:  Nausea and vomiting EXAM: ABDOMEN - 2 VIEW COMPARISON:  CT abdomen and pelvis Nov 15, 2016 FINDINGS: Supine and upright images obtained. There is moderate stool throughout the colon. There are loops of dilated small bowel multiple air-fluid levels. No free air. There are minimal pleural effusions bilaterally. IMPRESSION: Bowel gas pattern indicative of a degree of bowel obstruction. No free air evident. Electronically Signed   By: Lowella Grip III M.D.   On: 11/16/2016 08:54    Telemetry    Seen in nuc med - RSR - Personally Reviewed  Patient Profile     Brandy [name] is a 83 y.o. female with a hx of moderate AS (last assessed by echo 09/2015), no known hx of CAD, HTN, hyperlipidemia, invasive ductal carcinoma in her left breast status post mastectomy  and chemotherapy in 2013, mild asthma, anemia, depression, GERD, hiatal hernia, who presented with abd pain and incarcerated bowel and we are seeing for preop eval.  Assessment & Plan    1.  Preoperative eval/abnl ecg:  Pt with a h/o DOE in the setting of mod AS.  No chest pain. She is pending surgery for SBO. Plan myoview this am for risk stratification.  If low risk, she will be able to proceed to OR w/o further ischemic eval.  2.  Moderate Ao Stenosis:  No c/p or syncope.  Chronic DOE as above.   3. Essential HTN:  BP trending 130's to 150's.  Follow peri-op.  She has not received meds this am as she is npo for surgery.  4.  AKI:  Renal fxn now improved.  5.  Anemia:  Stable.  Signed, Murray Hodgkins, NP  11/17/2016, 9:28 AM

## 2016-11-17 NOTE — Anesthesia Procedure Notes (Deleted)
Procedures

## 2016-11-17 NOTE — Anesthesia Procedure Notes (Addendum)
Arterial Line Insertion Start/End5/15/2018 6:15 PM, 11/17/2016 6:20 AM Performed by: Linna Caprice Jacquita Mulhearn, anesthesiologist  Patient location: OR. Preanesthetic checklist: patient identified, IV checked, site marked, surgical consent, monitors and equipment checked, pre-op evaluation and timeout performed Right, brachial was placed Catheter size: 20 G  Attempts: 2 Procedure performed without using ultrasound guided technique. Following insertion, dressing applied and Biopatch. Patient tolerated the procedure well with no immediate complications.

## 2016-11-17 NOTE — Progress Notes (Signed)
eLink Physician-Brief Progress Note Patient Name: Brandy [name] DOB: 1932-08-19 MRN: [id number]   Date of Service  11/17/2016  HPI/Events of Note  Post op ventilator dependence.  eICU Interventions  Vent and PAD orders entered. PCM to consult formally     Intervention Category Major Interventions: Respiratory failure - evaluation and management  Brandy [name] 11/17/2016, 7:40 PM

## 2016-11-17 NOTE — Progress Notes (Signed)
Stress test revealed normal systolic function and no ischemia.  Ms. Etzkorn is at acceptable risk for surgery.  Shamaine Mulkern C. Oval Linsey, MD, [facility]  11/17/2016 12:33 PM

## 2016-11-17 NOTE — Progress Notes (Signed)
eLink Physician-Brief Progress Note Patient Name: Brandy [name] DOB: 06-02-1933 MRN: [id number]   Date of Service  11/17/2016  HPI/Events of Note  Vented pt needs SUP  eICU Interventions  pepcid     Intervention Category Major Interventions: Other:  [place] 11/17/2016, 11:10 PM

## 2016-11-17 NOTE — Progress Notes (Signed)
PROGRESS NOTE    Brandy [name]  IRW:[id number] DOB: 07/23/1932 DOA: 11/15/2016 PCP: Kathyrn Lass, MD    Brief Narrative:  83 y.o. female with medical history significant of HTN, HLD, remote breast cancer, GERD, and gallstones without h/o cholecystectomy presenting with n/v which began last Tuesday night.  She has been vomiting ever since.  Called Dr. Ardis Hughs, who called in some nausea pills which helped a bit.  Vomited about 2-3 times daily.  Pain in the area around her ventral hernia since Tuesday.  Pain is better following hernia reduction in the ER.  Last BM was Wednesday or Thursday and was normal; it is unusual for her to go so long between BMs.  No fevers.  ED Course: acute renal failure and leukocytosis on labs.  CT with incarcerated small bowel in left ventral hernia which was reduced successfully.  IVF, pain medication, antiemetics given.  Dr. Hassell Done will consult on the patient.  Assessment & Plan:   Principal Problem:   Incarcerated hernia Active Problems:   Depression   Gastroesophageal reflux disease without esophagitis   Hyperlipidemia   Essential hypertension   Acute renal failure (ARF) (HCC)   Hyperglycemia   Pre-operative cardiovascular examination  Incarcerated hernia -Patient with large ventral hernia with incarceration seen on CT -Reduction performed by Dr. Jeneen Rinks in the ER with success -Patient currently asymptomatic, denies nausea or abd pain -No NG tube currently -Discussed case with General Surgery. Plans for surgery today -Cardiology consulted for cardiac clearance, at acceptable risk for surgery per Cardiology -Lungs remain clear, renal function much improved, lytes unremarkable, denies sob or chest pain. Medically, benefits to surgery would outweigh risks at this time  Acute renal failure -Patient reports h/o intermittent n/v with decreased PO since 4-5 days prior to admission -Suspect prerenal azotemia -Patient had been continued on 125cc/hr of  IVF -Continue to avoid nephrotoxic agents -Resolved -Repeat bmet in AM  Depression -Currently holding PO medications at this time -Consider resuming antidepressant when no longer NPO -Stable at this time  HTN -Borderline low BP on admission, which is likely related to volume deficiency -BP meds remain on hold -BP currently stable  Hyperglycemia -Random glucose of 145 -May be stress response -Will repeat bmet in AM  Moderate AS -Recent 2d echo reviewed from 4/18 -Patient with stable moderate AS noted -Cardiology consulted for cardiac clearance. Stress test with no ischemic changes  DVT prophylaxis: SCD's Code Status: DNR Family Communication: Pt in room, family at bedside Disposition Plan: Uncertain at this time  Consultants:   General Surgery  Cardiology  Procedures:   Stress test 5/15  Antimicrobials: Anti-infectives    None      Subjective: No sob or chest pain. Reports continued abd discomfort  Objective: Vitals:   11/17/16 0922 11/17/16 0924 11/17/16 0926 11/17/16 0928  BP: (!) 154/59 (!) 130/44 (!) 137/46 (!) 141/49  Pulse: 72 100 (!) 101 100  Resp:      Temp:      TempSrc:      SpO2:      Weight:      Height:        Intake/Output Summary (Last 24 hours) at 11/17/16 1258 Last data filed at 11/17/16 0600  Gross per 24 hour  Intake             1375 ml  Output             2000 ml  Net             -  625 ml   Filed Weights   11/16/16 0019  Weight: 73 kg (160 lb 15 oz)    Examination:  General exam: Awake, laying in bed, in nad Respiratory system: Normal respiratory effort, no wheezing Cardiovascular system: regular rate, s1, s2, systolic murmur Gastrointestinal system: palpable lower quadrant hernia, mildly tender, pos BS Central nervous system: CN2-12 grossly intact, strength intact Extremities: Perfused, no clubbing Skin: Normal skin turgor, no notable skin lesions seen Psychiatry: Mood normal // no visual hallucinations   Data  Reviewed: I have personally reviewed following labs and imaging studies  CBC:  Recent Labs Lab 11/15/16 1850 11/16/16 0415 11/17/16 0418  WBC 16.5* 12.9* 10.1  HGB 13.2 10.8* 10.1*  HCT 39.6 34.4* 31.1*  MCV 87.4 88.2 88.9  PLT 368 311 381   Basic Metabolic Panel:  Recent Labs Lab 11/15/16 1850 11/16/16 0415 11/17/16 0418  NA 136 138 140  K 4.6 4.1 4.0  CL 100* 104 109  CO2 20* 23 23  GLUCOSE 145* 111* 77  BUN 60* 67* 44*  CREATININE 2.50* 2.54* 1.03*  CALCIUM 9.4 8.3* 8.3*   GFR: Estimated Creatinine Clearance: 37.3 mL/min (A) (by C-G formula based on SCr of 1.03 mg/dL (H)). Liver Function Tests:  Recent Labs Lab 11/15/16 1850  AST 27  ALT 26  ALKPHOS 64  BILITOT 0.8  PROT 7.8  ALBUMIN 3.9    Recent Labs Lab 11/15/16 1850  LIPASE 24   No results for input(s): AMMONIA in the last 168 hours. Coagulation Profile: No results for input(s): INR, PROTIME in the last 168 hours. Cardiac Enzymes: No results for input(s): CKTOTAL, CKMB, CKMBINDEX, TROPONINI in the last 168 hours. BNP (last 3 results) No results for input(s): PROBNP in the last 8760 hours. HbA1C: No results for input(s): HGBA1C in the last 72 hours. CBG: No results for input(s): GLUCAP in the last 168 hours. Lipid Profile: No results for input(s): CHOL, HDL, LDLCALC, TRIG, CHOLHDL, LDLDIRECT in the last 72 hours. Thyroid Function Tests: No results for input(s): TSH, T4TOTAL, FREET4, T3FREE, THYROIDAB in the last 72 hours. Anemia Panel: No results for input(s): VITAMINB12, FOLATE, FERRITIN, TIBC, IRON, RETICCTPCT in the last 72 hours. Sepsis Labs:  Recent Labs Lab 11/15/16 2119  LATICACIDVEN 1.72    No results found for this or any previous visit (from the past 240 hour(s)).   Radiology Studies: Ct Abdomen Pelvis Wo Contrast  Result Date: 11/15/2016 CLINICAL DATA:  Generalized abdominal pain and emesis x2 weeks. EXAM: CT ABDOMEN AND PELVIS WITHOUT CONTRAST TECHNIQUE: Multidetector  CT imaging of the abdomen and pelvis was performed following the standard protocol without IV contrast. COMPARISON:  12/17/2013 FINDINGS: Lower chest: Cardiomegaly with aortic atherosclerosis, mitral annular calcifications and coronary arteriosclerosis. No pericardial effusion. There is atelectasis at the lung bases. Stable moderate-sized hiatal hernia. Hepatobiliary: Punctate calcification in the right hepatic dome consistent with the tiny granuloma or vascular calcification. No space-occupying mass of the liver noted. Gallbladder is nondistended. No gallstones are seen. No biliary dilatation is identified. Pancreas: Unremarkable. No pancreatic ductal dilatation or surrounding inflammatory changes.Normal maxilla normal Spleen: Normal in size without focal abnormality. Adrenals/Urinary Tract: Normal bilateral adrenal glands. Stable cortical and parapelvic cysts noted of both kidneys. Hyperdense exophytic lesion off the upper pole the right kidney is stable likely to represent a proteinaceous or hemorrhagic cyst, also stable in size in consistent with a benign finding given stability since 2015. No obstructive uropathy is identified. There are punctate calcifications within both kidneys which may be secondary  to vascular calcifications or nephrolithiasis. Stomach/Bowel: Moderate fluid-filled distention of the stomach. Dilated fluid-filled small bowel is seen starting from the second portion of the duodenum leading up to a transition point within a left periumbilical ventral hernia, series 2, image 60. There is one right-sided periumbilical ventral hernia containing nondistended loops of ileum, a fat containing umbilical hernia, and two left-sided periumbilical ventral hernias, the larger of which demonstrates the transition point. The more medial of the left-sided hernias contains omental fat, nondistended ileum and fluid-filled distended jejunum up to 4.1 cm. A short segment of incarcerated jejunum is not entirely  excluded. The more lateral left-sided hernia contains omental fat and nondistended ileum. Vascular/Lymphatic: Aortic atherosclerosis. No enlarged abdominal or pelvic lymph nodes. Reproductive: Atrophic appearance of the uterus consistent with patient's age. No adnexal mass. Other: No abdominopelvic ascites. Musculoskeletal: Thoracolumbar spondylosis. No acute nor suspicious osseous findings. IMPRESSION: 1. Three ventral, periumbilical hernias are identified containing small bowel, one to the right of the umbilicus and two to the left. The more medial of the left-sided ventral hernias demonstrates a transition point contributing to a high-grade small bowel obstruction. A short segment of fluid-filled dilated small bowel is seen measuring up to 4.1 cm in caliber within the medial left-sided hernia and incarceration is not entirely excluded. Correlate for reducibility. 2. Stable simple and complex cysts as well as punctate bilateral nephrolithiasis of the kidneys. 3. Stable cardiomegaly with mitral annular calcifications and coronary arteriosclerosis. Moderate-sized hiatal hernia. Electronically Signed   By: Ashley Royalty M.D.   On: 11/15/2016 21:19   Nm Myocar Multi W/spect W/wall Motion / Ef  Result Date: 11/17/2016 CLINICAL DATA:  Hypertension, asthma, abdominal pain. Preop for surgery. EXAM: MYOCARDIAL IMAGING WITH SPECT (REST AND PHARMACOLOGIC-STRESS) GATED LEFT VENTRICULAR WALL MOTION STUDY LEFT VENTRICULAR EJECTION FRACTION TECHNIQUE: Standard myocardial SPECT imaging was performed after resting intravenous injection of 10 mCi Tc-51m tetrofosmin. Subsequently, intravenous infusion of Lexiscan was performed under the supervision of the Cardiology staff. At peak effect of the drug, 30 mCi Tc-89m tetrofosmin was injected intravenously and standard myocardial SPECT imaging was performed. Quantitative gated imaging was also performed to evaluate left ventricular wall motion, and estimate left ventricular ejection  fraction. COMPARISON:  None. FINDINGS: Perfusion: Decreased activity in the inferior wall on both sets of images likely due to scar. No reversible defects to suggest ischemia. Wall Motion: Decrease thickening and activity in the inferior wall. The remainder the left ventricle demonstrates normal wall motion with an myocardial thickening with contraction. Left Ventricular Ejection Fraction: 71 % End diastolic volume 66 ml End systolic volume 19 ml IMPRESSION: 1. Suspect inferior wall scar/infarction. No findings for ischemia. 2. Normal left ventricular wall motion except for the inferior wall. 3. Left ventricular ejection fraction 71% 4. Non invasive risk stratification*: Intermediate *2012 Appropriate Use Criteria for Coronary Revascularization Focused Update: J Am Coll Cardiol. 8101;75(1):025-852. http://content.airportbarriers.com.aspx?articleid=[email or web address] Electronically Signed   By: Marijo Sanes M.D.   On: 11/17/2016 12:28   Dg Chest Port 1 View  Result Date: 11/16/2016 CLINICAL DATA:  Shortness of breath. EXAM: PORTABLE CHEST 1 VIEW COMPARISON:  08/21/2015. FINDINGS: Trachea is midline. Heart size stable. Lungs are clear. No pleural fluid. Degenerative changes in the right shoulder. Left shoulder arthroplasty is partially imaged. IMPRESSION: No acute findings. Electronically Signed   By: Lorin Picket M.D.   On: 11/16/2016 12:17   Dg Abd 2 Views  Result Date: 11/16/2016 CLINICAL DATA:  Nausea and vomiting EXAM: ABDOMEN - 2 VIEW COMPARISON:  CT abdomen and  pelvis Nov 15, 2016 FINDINGS: Supine and upright images obtained. There is moderate stool throughout the colon. There are loops of dilated small bowel multiple air-fluid levels. No free air. There are minimal pleural effusions bilaterally. IMPRESSION: Bowel gas pattern indicative of a degree of bowel obstruction. No free air evident. Electronically Signed   By: Lowella Grip III M.D.   On: 11/16/2016 08:54    Scheduled Meds: Continuous  Infusions: . lactated ringers 125 mL/hr at 11/16/16 1842     LOS: 2 days   Tony Friscia, Orpah Melter, MD [facility] Pager [phone]  If 7PM-7AM, please contact night-coverage [email or web address] Password [facility] 11/17/2016, 12:58 PM

## 2016-11-17 NOTE — Anesthesia Postprocedure Evaluation (Addendum)
Anesthesia Post Note  Patient: Brandy [name]  Procedure(s) Performed: Procedure(s) (LRB): EXPLORATORY LAPAROTOMY WITH LYSIS OF ADHESIONS, SMALL BOWEL RESECTION, REPAIR OF INCARCERATED VENTRAL INCISIONAL HERNIA, INSERTION OF BIOLOGIC MESH PATCH,APPLICATION OF WOUND VAC DRESSING (N/A)  Patient location during evaluation: ICU Anesthesia Type: General Level of consciousness: awake and patient remains intubated per anesthesia plan Pain management: pain level controlled Vital Signs Assessment: post-procedure vital signs reviewed and stable Respiratory status: patient remains intubated per anesthesia plan and patient on ventilator - see flowsheet for VS Cardiovascular status: blood pressure returned to baseline Anesthetic complications: no       Last Vitals:  Vitals:   11/17/16 0928 11/17/16 1939  BP: (!) 141/49 (!) 194/75  Pulse: 100 96  Resp:  20  Temp:      Last Pain:  Vitals:   11/17/16 1300  TempSrc:   PainSc: 0-No pain                 Lajeana Strough COKER

## 2016-11-17 NOTE — Progress Notes (Signed)
General Surgery [facility]  Assessment & Plan: Small bowel obstruction secondary to recurrent ventral incisional hernia, incarcerated  Await results of cardiac study this AM - if low risk, will proceed to OR today  Discussed with patient and family at bedside  NPO, IVF  The risks and benefits of the procedure have been discussed at length with the patient.  The patient understands the proposed procedure, potential alternative treatments, and the course of recovery to be expected.  All of the patient's questions have been answered at this time.  The patient wishes to proceed with surgery.         Earnstine Regal, MD, [facility]       Office: [phone]    Chief Complaint: Small bowel obstruction, incarcerated ventral incisional hernia  Subjective: Patient back from cardiac study, awaiting results.  Family in room.  Mild pain in abdomen.  Objective: Vital signs in last 24 hours: Temp:  [97.8 F (36.6 C)-98.6 F (37 C)] 98.4 F (36.9 C) (05/15 0540) Pulse Rate:  [65-101] 100 (05/15 0928) Resp:  [17] 17 (05/15 0540) BP: (119-154)/(44-84) 141/49 (05/15 0928) SpO2:  [94 %-96 %] 95 % (05/15 0540) Last BM Date:  (Pt states last week, 5/9?)  Intake/Output from previous day: 05/14 0701 - 05/15 0700 In: 1375 [I.V.:1375] Out: 2000 [Urine:2000] Intake/Output this shift: No intake/output data recorded.  Physical Exam: HEENT - sclerae clear, mucous membranes moist Neck - soft Chest - clear bilaterally Cor - RRR Abdomen - protuberant, obvious hernia left of midline, mild tenderness, not reducible Ext - no edema, non-tender Neuro - alert & oriented, no focal deficits  Lab Results:   Recent Labs  11/16/16 0415 11/17/16 0418  WBC 12.9* 10.1  HGB 10.8* 10.1*  HCT 34.4* 31.1*  PLT 311 239   BMET  Recent Labs  11/16/16 0415 11/17/16 0418  NA 138 140  K 4.1 4.0  CL 104 109  CO2 23 23  GLUCOSE 111* 77  BUN 67*  44*  CREATININE 2.54* 1.03*  CALCIUM 8.3* 8.3*   PT/INR No results for input(s): LABPROT, INR in the last 72 hours. Comprehensive Metabolic Panel:    Component Value Date/Time   NA 140 11/17/2016 0418   NA 138 11/16/2016 0415   NA 141 01/13/2016 1354   NA 140 07/09/2015 1549   K 4.0 11/17/2016 0418   K 4.1 11/16/2016 0415   K 4.3 01/13/2016 1354   K 3.9 07/09/2015 1549   CL 109 11/17/2016 0418   CL 104 11/16/2016 0415   CL 108 (H) 12/28/2012 1343   CL 103 12/07/2012 1333   CO2 23 11/17/2016 0418   CO2 23 11/16/2016 0415   CO2 22 01/13/2016 1354   CO2 24 07/09/2015 1549   BUN 44 (H) 11/17/2016 0418   BUN 67 (H) 11/16/2016 0415   BUN 21.6 01/13/2016 1354   BUN 19.8 07/09/2015 1549   CREATININE 1.03 (H) 11/17/2016 0418   CREATININE 2.54 (H) 11/16/2016 0415   CREATININE 1.0 01/13/2016 1354   CREATININE 0.8 07/09/2015 1549   GLUCOSE 77 11/17/2016 0418   GLUCOSE 111 (H) 11/16/2016 0415   GLUCOSE 135 01/13/2016 1354   GLUCOSE 133 07/09/2015 1549   GLUCOSE 99 12/28/2012 1343   GLUCOSE 105 (H) 12/07/2012 1333   CALCIUM 8.3 (L) 11/17/2016 0418   CALCIUM 8.3 (L) 11/16/2016 0415   CALCIUM 9.6 01/13/2016 1354   CALCIUM 9.4 07/09/2015 1549  AST 27 11/15/2016 1850   AST 15 01/13/2016 1354   AST 16 07/09/2015 1549   ALT 26 11/15/2016 1850   ALT 11 01/13/2016 1354   ALT 13 07/09/2015 1549   ALKPHOS 64 11/15/2016 1850   ALKPHOS 75 01/13/2016 1354   ALKPHOS 68 07/09/2015 1549   BILITOT 0.8 11/15/2016 1850   BILITOT <0.30 01/13/2016 1354   BILITOT <0.30 07/09/2015 1549   PROT 7.8 11/15/2016 1850   PROT 6.9 01/13/2016 1354   PROT 7.0 07/09/2015 1549   ALBUMIN 3.9 11/15/2016 1850   ALBUMIN 3.5 01/13/2016 1354   ALBUMIN 3.4 (L) 07/09/2015 1549    Studies/Results: Ct Abdomen Pelvis Wo Contrast  Result Date: 11/15/2016 CLINICAL DATA:  Generalized abdominal pain and emesis x2 weeks. EXAM: CT ABDOMEN AND PELVIS WITHOUT CONTRAST TECHNIQUE: Multidetector CT imaging of the  abdomen and pelvis was performed following the standard protocol without IV contrast. COMPARISON:  12/17/2013 FINDINGS: Lower chest: Cardiomegaly with aortic atherosclerosis, mitral annular calcifications and coronary arteriosclerosis. No pericardial effusion. There is atelectasis at the lung bases. Stable moderate-sized hiatal hernia. Hepatobiliary: Punctate calcification in the right hepatic dome consistent with the tiny granuloma or vascular calcification. No space-occupying mass of the liver noted. Gallbladder is nondistended. No gallstones are seen. No biliary dilatation is identified. Pancreas: Unremarkable. No pancreatic ductal dilatation or surrounding inflammatory changes.Normal maxilla normal Spleen: Normal in size without focal abnormality. Adrenals/Urinary Tract: Normal bilateral adrenal glands. Stable cortical and parapelvic cysts noted of both kidneys. Hyperdense exophytic lesion off the upper pole the right kidney is stable likely to represent a proteinaceous or hemorrhagic cyst, also stable in size in consistent with a benign finding given stability since 2015. No obstructive uropathy is identified. There are punctate calcifications within both kidneys which may be secondary to vascular calcifications or nephrolithiasis. Stomach/Bowel: Moderate fluid-filled distention of the stomach. Dilated fluid-filled small bowel is seen starting from the second portion of the duodenum leading up to a transition point within a left periumbilical ventral hernia, series 2, image 60. There is one right-sided periumbilical ventral hernia containing nondistended loops of ileum, a fat containing umbilical hernia, and two left-sided periumbilical ventral hernias, the larger of which demonstrates the transition point. The more medial of the left-sided hernias contains omental fat, nondistended ileum and fluid-filled distended jejunum up to 4.1 cm. A short segment of incarcerated jejunum is not entirely excluded. The more  lateral left-sided hernia contains omental fat and nondistended ileum. Vascular/Lymphatic: Aortic atherosclerosis. No enlarged abdominal or pelvic lymph nodes. Reproductive: Atrophic appearance of the uterus consistent with patient's age. No adnexal mass. Other: No abdominopelvic ascites. Musculoskeletal: Thoracolumbar spondylosis. No acute nor suspicious osseous findings. IMPRESSION: 1. Three ventral, periumbilical hernias are identified containing small bowel, one to the right of the umbilicus and two to the left. The more medial of the left-sided ventral hernias demonstrates a transition point contributing to a high-grade small bowel obstruction. A short segment of fluid-filled dilated small bowel is seen measuring up to 4.1 cm in caliber within the medial left-sided hernia and incarceration is not entirely excluded. Correlate for reducibility. 2. Stable simple and complex cysts as well as punctate bilateral nephrolithiasis of the kidneys. 3. Stable cardiomegaly with mitral annular calcifications and coronary arteriosclerosis. Moderate-sized hiatal hernia. Electronically Signed   By: Ashley Royalty M.D.   On: 11/15/2016 21:19   Dg Chest Port 1 View  Result Date: 11/16/2016 CLINICAL DATA:  Shortness of breath. EXAM: PORTABLE CHEST 1 VIEW COMPARISON:  08/21/2015. FINDINGS: Trachea is midline.  Heart size stable. Lungs are clear. No pleural fluid. Degenerative changes in the right shoulder. Left shoulder arthroplasty is partially imaged. IMPRESSION: No acute findings. Electronically Signed   By: Lorin Picket M.D.   On: 11/16/2016 12:17   Dg Abd 2 Views  Result Date: 11/16/2016 CLINICAL DATA:  Nausea and vomiting EXAM: ABDOMEN - 2 VIEW COMPARISON:  CT abdomen and pelvis Nov 15, 2016 FINDINGS: Supine and upright images obtained. There is moderate stool throughout the colon. There are loops of dilated small bowel multiple air-fluid levels. No free air. There are minimal pleural effusions bilaterally. IMPRESSION:  Bowel gas pattern indicative of a degree of bowel obstruction. No free air evident. Electronically Signed   By: Lowella Grip III M.D.   On: 11/16/2016 08:54      Deana Krock M 11/17/2016  Patient ID: Brandy [name], female   DOB: 01-27-1933, 83 y.o.   MRN: [id number]

## 2016-11-17 NOTE — Anesthesia Preprocedure Evaluation (Signed)
Anesthesia Evaluation  Patient identified by MRN, date of birth, ID band Patient awake    Reviewed: Allergy & Precautions, NPO status , Patient's Chart, lab work & pertinent test results  Airway Mallampati: II  TM Distance: >3 FB Neck ROM: Full    Dental  (+) Teeth Intact, Dental Advisory Given   Pulmonary    breath sounds clear to auscultation       Cardiovascular hypertension,  Rhythm:Regular Rate:Normal     Neuro/Psych    GI/Hepatic   Endo/Other    Renal/GU      Musculoskeletal   Abdominal   Peds  Hematology   Anesthesia Other Findings   Reproductive/Obstetrics                             Anesthesia Physical Anesthesia Plan  ASA: III  Anesthesia Plan: General   Post-op Pain Management:    Induction: Intravenous  Airway Management Planned: Oral ETT  Additional Equipment:   Intra-op Plan:   Post-operative Plan: Possible Post-op intubation/ventilation  Informed Consent: I have reviewed the patients History and Physical, chart, labs and discussed the procedure including the risks, benefits and alternatives for the proposed anesthesia with the patient or authorized representative who has indicated his/her understanding and acceptance.   Dental advisory given  Plan Discussed with: CRNA and Anesthesiologist  Anesthesia Plan Comments:         Anesthesia Quick Evaluation

## 2016-11-18 ENCOUNTER — Encounter (HOSPITAL_COMMUNITY): Payer: Self-pay | Admitting: Surgery

## 2016-11-18 DIAGNOSIS — J95821 Acute postprocedural respiratory failure: Secondary | ICD-10-CM | POA: Diagnosis not present

## 2016-11-18 DIAGNOSIS — K460 Unspecified abdominal hernia with obstruction, without gangrene: Secondary | ICD-10-CM | POA: Diagnosis not present

## 2016-11-18 DIAGNOSIS — E43 Unspecified severe protein-calorie malnutrition: Secondary | ICD-10-CM | POA: Diagnosis not present

## 2016-11-18 DIAGNOSIS — N179 Acute kidney failure, unspecified: Secondary | ICD-10-CM | POA: Diagnosis not present

## 2016-11-18 DIAGNOSIS — K430 Incisional hernia with obstruction, without gangrene: Secondary | ICD-10-CM | POA: Diagnosis not present

## 2016-11-18 LAB — CBC WITH DIFFERENTIAL/PLATELET
BASOS ABS: 0 10*3/uL (ref 0.0–0.1)
BASOS PCT: 0 %
EOS ABS: 0 10*3/uL (ref 0.0–0.7)
EOS PCT: 0 %
HCT: 32.3 % — ABNORMAL LOW (ref 36.0–46.0)
Hemoglobin: 10.7 g/dL — ABNORMAL LOW (ref 12.0–15.0)
Lymphocytes Relative: 6 %
Lymphs Abs: 1.3 10*3/uL (ref 0.7–4.0)
MCH: 29.4 pg (ref 26.0–34.0)
MCHC: 33.1 g/dL (ref 30.0–36.0)
MCV: 88.7 fL (ref 78.0–100.0)
Monocytes Absolute: 0.8 10*3/uL (ref 0.1–1.0)
Monocytes Relative: 4 %
NEUTROS PCT: 90 %
Neutro Abs: 17.9 10*3/uL — ABNORMAL HIGH (ref 1.7–7.7)
PLATELETS: 288 10*3/uL (ref 150–400)
RBC: 3.64 MIL/uL — AB (ref 3.87–5.11)
RDW: 16.9 % — ABNORMAL HIGH (ref 11.5–15.5)
WBC: 20 10*3/uL — AB (ref 4.0–10.5)

## 2016-11-18 LAB — BASIC METABOLIC PANEL
ANION GAP: 8 (ref 5–15)
BUN: 37 mg/dL — AB (ref 6–20)
CO2: 22 mmol/L (ref 22–32)
Calcium: 7.9 mg/dL — ABNORMAL LOW (ref 8.9–10.3)
Chloride: 108 mmol/L (ref 101–111)
Creatinine, Ser: 1.11 mg/dL — ABNORMAL HIGH (ref 0.44–1.00)
GFR, EST AFRICAN AMERICAN: 53 mL/min — AB (ref >60)
GFR, EST NON AFRICAN AMERICAN: 45 mL/min — AB (ref >60)
Glucose, Bld: 205 mg/dL — ABNORMAL HIGH (ref 65–99)
POTASSIUM: 4.7 mmol/L (ref 3.5–5.1)
SODIUM: 138 mmol/L (ref 135–145)

## 2016-11-18 LAB — PHOSPHORUS: PHOSPHORUS: 3.8 mg/dL (ref 2.5–4.6)

## 2016-11-18 LAB — MAGNESIUM: MAGNESIUM: 1.8 mg/dL (ref 1.7–2.4)

## 2016-11-18 MED ORDER — CHLORHEXIDINE GLUCONATE 0.12% ORAL RINSE (MEDLINE KIT): 15.0000 mL | Freq: Two times a day (BID) | OROMUCOSAL | Status: DC

## 2016-11-18 MED ORDER — ORAL CARE MOUTH RINSE
15.0000 mL | OROMUCOSAL | Status: DC
  Administered 2016-11-18 (×6): 15 mL via OROMUCOSAL

## 2016-11-18 MED ORDER — DEXTROSE-NACL 5-0.45 % IV SOLN
INTRAVENOUS | Status: DC
  Administered 2016-11-18 – 2016-11-25 (×11): via INTRAVENOUS

## 2016-11-18 MED ORDER — FENTANYL CITRATE (PF) 100 MCG/2ML IJ SOLN
25.0000 ug | INTRAMUSCULAR | Status: DC | PRN
  Administered 2016-11-18 – 2016-11-21 (×12): 50 ug via INTRAVENOUS
  Administered 2016-11-22 (×2): 25 ug via INTRAVENOUS
  Administered 2016-11-22: 50 ug via INTRAVENOUS
  Administered 2016-11-22: 25 ug via INTRAVENOUS
  Administered 2016-11-23 (×2): 50 ug via INTRAVENOUS
  Administered 2016-11-23 (×3): 25 ug via INTRAVENOUS
  Administered 2016-11-24 (×2): 50 ug via INTRAVENOUS
  Filled 2016-11-18 (×23): qty 2

## 2016-11-18 MED ORDER — ORAL CARE MOUTH RINSE: 15.0000 mL | Freq: Four times a day (QID) | OROMUCOSAL | Status: DC

## 2016-11-18 MED ORDER — CHLORHEXIDINE GLUCONATE CLOTH 2 % EX PADS
6.0000 | MEDICATED_PAD | Freq: Every day | CUTANEOUS | Status: DC
  Administered 2016-11-18 – 2016-11-25 (×8): 6 via TOPICAL

## 2016-11-18 NOTE — Progress Notes (Signed)
PULMONARY / CRITICAL CARE MEDICINE   Name: Brandy [name] MRN: [id number] DOB: 1933/02/13    ADMISSION DATE:  11/15/2016 CONSULTATION DATE:  11/17/16  REFERRING MD:  Harlow Asa  CHIEF COMPLAINT:  Post op vent management  HISTORY OF PRESENT ILLNESS:  Pt is encephelopathic; therefore, this HPI is obtained from chart review. Brandy [name] is a 83 y.o. female  presented to [facility] 5/13 with small bowel obstruction due to incarcerated bowel related to a chronic ventral hernia  She was evaluated by CCS who then took her to the OR 5/15 for ex lap with lysis of adhesions, small bowel resection, and repair of incarcerated hernia with insertion of biologic mesh patch.  She had wound vac placed and returned to the ICU on the ventilator.   SUBJECTIVE:  On vent, sedated on fent gtt RASS-1 UO poor afebrile  VITAL SIGNS: BP (!) 125/48   Pulse 82   Temp 99.7 F (37.6 C) (Axillary)   Resp 15   Ht 4\' 11"  (1.499 m)   Wt 160 lb 15 oz (73 kg)   SpO2 95%   BMI 32.51 kg/m   HEMODYNAMICS:    VENTILATOR SETTINGS: Vent Mode: PCV FiO2 (%):  [30 %-50 %] 30 % Set Rate:  [15 bmp] 15 bmp Vt Set:  [350 mL-440 mL] 350 mL PEEP:  [5 cmH20] 5 cmH20 Plateau Pressure:  [13 cmH20-20 cmH20] 18 cmH20  INTAKE / OUTPUT: I/O last 3 completed shifts: In: 5205.2 [I.V.:5105.2; IV Piggyback:100] Out: 0867 [Urine:1420; Drains:200; Blood:150]   PHYSICAL EXAMINATION: General: Elderly female, in NAD. Neuro: Sedated,  responds by nodding head HEENT: [place]/AT. PERRL, sclerae anicteric.  ETT in place. Cardiovascular: RRR, 3/6 ESM at base Lungs: Respirations even and unlabored.  CTA bilaterally, No W/R/R.  Abdomen: Abdominal incision noted with wound vac in place. Musculoskeletal: No gross deformities, no edema.  Skin: Intact, warm, no rashes.  LABS:  BMET  Recent Labs Lab 11/16/16 0415 11/17/16 0418 11/18/16 0411  NA 138 140 138  K 4.1 4.0 4.7  CL 104 109 108  CO2 23 23 22   BUN 67* 44* 37*  CREATININE 2.54*  1.03* 1.11*  GLUCOSE 111* 77 205*    Electrolytes  Recent Labs Lab 11/16/16 0415 11/17/16 0418 11/18/16 0411  CALCIUM 8.3* 8.3* 7.9*  MG  --   --  1.8  PHOS  --   --  3.8    CBC  Recent Labs Lab 11/16/16 0415 11/17/16 0418 11/18/16 0411  WBC 12.9* 10.1 20.0*  HGB 10.8* 10.1* 10.7*  HCT 34.4* 31.1* 32.3*  PLT 311 239 288    Coag's No results for input(s): APTT, INR in the last 168 hours.  Sepsis Markers  Recent Labs Lab 11/15/16 2119  LATICACIDVEN 1.72    ABG  Recent Labs Lab 11/17/16 1950  PHART 7.293*  PCO2ART 44.2  PO2ART 142*    Liver Enzymes  Recent Labs Lab 11/15/16 1850  AST 27  ALT 26  ALKPHOS 64  BILITOT 0.8  ALBUMIN 3.9    Cardiac Enzymes No results for input(s): TROPONINI, PROBNP in the last 168 hours.  Glucose No results for input(s): GLUCAP in the last 168 hours.  Imaging CXR reviewed 5/15   STUDIES:  CT A / P 5/15 > 3 ventral / periumbilical hernias containing small bowel, incarceration is not entirely excluded, high grade SBO. Nuc Med 5/15 > no ischemia, LVEF 71%.  CULTURES: None.  ANTIBIOTICS: Ancef 5/15 x 1 Ertapenem 5/15 >   SIGNIFICANT EVENTS: 5/13 >  admit. 5/15 > to OR  LINES/TUBES: ETT 5/15 >  R IJ CVL 5/15 >   DISCUSSION: 83 y.o. female admitted 5/13 with SBO and incarcerated hernia, taken to OR 5/15 for ex-lap with lysis of adhesions, small bowel resection, and repair of incarcerated hernia with insertion of biologic mesh patch.  Following OR, she returned to the ICU on the vent and PCCM was asked to see for vent management.  ASSESSMENT / PLAN:  GASTROINTESTINAL A:   SBO with incarcerated hernia - s/p ex lap with lysis of adhesions, small bowel resection, and repair of incarcerated hernia with insertion of biologic mesh patch 5/15 . GERD. Nutrition. Hx dysphagia. P:   Post op care, wound vac per CCS. SUP: Pantoprazole. NPO.  PULMONARY A: Respiratory insufficiency - not extubated post op  due to plans for return trip to OR. Hx asthma. P:   Vent settings reviewed & adjusted SBTs when ok with surgery VAP prevention measures.   CARDIOVASCULAR A:  Hx HTN, HLD, AS. DNR Status. P:  Monitor hemodynamics. Hold preadmission amlodipine, spironolactone, diovan.  RENAL A:   AKI - improved P:   replete electrolytes as indicated. BMP daily  HEMATOLOGIC / ONCOLOGIC A:   VTE Prophylaxis. Hx radiation therapy to left breast. P:  SCD's. CBC in AM.  INFECTIOUS A:   Peritonitis. P:   Ct ertapenem x 7-10 ds.   ENDOCRINE A:   Hyperglycemia - no hx DM. P:   SSI if glucose consistently > 180.  NEUROLOGIC A:   Acute encephalopathy - due to sedation. Hx depression. P:   Sedation:  Fentanyl gtt / Midazolam PRN. RASS goal: -1. Daily WUA. Hold preadmission citalopram, gabapentin.  Family updated: None at bedside  Interdisciplinary Family Meeting v Palliative Care Meeting:  Due by: 11/23/16.  CC time: 32 min.  Kara Mead MD. [place] Flood.  Pulmonary & Critical care Pager [phone] If no response call [phone]    11/18/2016, 8:52 AM

## 2016-11-18 NOTE — Progress Notes (Addendum)
Initial Nutrition Assessment  DOCUMENTATION CODES:   Obesity unspecified  INTERVENTION:  If pt to remain intubated >/= 24 hours, recommend Vital High Protein @ 15 mL/hr with goal rate of Vital High Protein @ 45 mL/hr. Goal rate will provide 1080 kcal, 95 grams of protein (110% estimated protein need), and 903 mL free water.   NUTRITION DIAGNOSIS:   Inadequate oral intake related to inability to eat as evidenced by NPO status.  GOAL:   Provide needs based on ASPEN/SCCM guidelines  MONITOR:   Vent status, Weight trends, Labs  REASON FOR ASSESSMENT:   Ventilator  ASSESSMENT:   83 y.o. female with medical history significant of HTN, HLD, remote breast cancer, GERD, and gallstones without h/o cholecystectomy presenting with n/v which began last Tuesday night.  She has been vomiting ever since.  Called Dr. Ardis Hughs, who called in some nausea pills which helped a bit.  Vomited about 2-3 times daily.  Pain in the area around her ventral hernia since Tuesday.  Pain is better following hernia reduction in the ER.  Last BM was Wednesday or Thursday and was normal; it is unusual for her to go so long between BMs. On 5/15 pt underwent ex lap with LOA and repair of incarcerated ventral hernia.   Pt seen for new vent. BMI indicates obesity. Pt is POD #1 ex lap with lysis of adhesions, small bowel resection, repair of incarcerated ventral hernia, and biologic patch placement. NGT in place to LIS with 100cc light brown/tan output present at this time. Pt awake and able to indicate that she is not experiencing abdominal pain at this time. No family/visitors present to provide PTA information.   Physical assessment shows no muscle and no fat wasting. No new weight since admission. Per chart review, pt has lost 16 lbs (9% body weight) in the past 2 months which is significant for time frame.  Patient is currently intubated on ventilator support MV: 9.7 L/min Temp (24hrs), Avg:98.4 F (36.9 C), Min:97.4  F (36.3 C), Max:99.7 F (37.6 C) Propofol: none  Medications reviewed; 20 mg IV Pepcid/day.  Labs reviewed; BUN: 37 mg/dL, creatinine: 1.11 mg/dL, Ca: 7.9 mg/dL, GFR: 45 mL/min.  IVF: D5-1/2 NS @ 75 mL/hr (306 kcal from dextrose).    Diet Order:  Diet NPO time specified  Skin:  Wound (see comment) (Abdominal incision with wound vac from 11/17/16)  Last BM:  PTA/unknown  Height:   Ht Readings from Last 1 Encounters:  11/17/16 4\' 11"  (1.499 m)    Weight:   Wt Readings from Last 1 Encounters:  11/16/16 160 lb 15 oz (73 kg)    Ideal Body Weight:  42.91 kg  BMI:  Body mass index is 32.51 kg/m.  Estimated Nutritional Needs:   Kcal:  [phone] (11-14 kcal/kg)  Protein:  86 grams (2 grams/kg IBW)  Fluid:  >/= 1.7 L/day  EDUCATION NEEDS:   No education needs identified at this time    Jarome Matin, MS, RD, LDN, CNSC Inpatient Clinical Dietitian Pager # [phone] After hours/weekend pager # [phone]

## 2016-11-18 NOTE — Progress Notes (Signed)
[facility]  Assessment & Plan: POD#1 - status post ex lap, lysis of adhesions, small bowel resection, repair ventral inc hernia with biologic patch  NPO, NG decompression  IV hydration - remove KCL from IVF  VAC dressing to abd wound - will consult [place] for dressing change tomorrow at bedside  On vent post op - CCM managing - wean per CCM        Earnstine Regal, MD, [facility]       Office: [phone]   Chief Complaint: Small bowel obstruction, ventral herniae  Subjective: Patient in ICU, on vent post op.  Family at bedside.  Awakens to voice.  Objective: Vital signs in last 24 hours: Temp:  [97.4 F (36.3 C)-98.1 F (36.7 C)] 97.4 F (36.3 C) (05/16 0400) Pulse Rate:  [72-101] 82 (05/16 0600) Resp:  [15-21] 15 (05/16 0600) BP: (125-194)/(44-75) 125/48 (05/16 0315) SpO2:  [95 %-100 %] 95 % (05/16 0600) Arterial Line BP: (118-164)/(45-65) 129/46 (05/16 0600) FiO2 (%):  [30 %-50 %] 30 % (05/16 0400) Last BM Date:  (Pt states last week, 5/9?)  Intake/Output from previous day: 05/15 0701 - 05/16 0700 In: 3830.2 [I.V.:3730.2; IV Piggyback:100] Out: 870 [Urine:520; Drains:200; Blood:150] Intake/Output this shift: Total I/O In: 136.7 [I.V.:136.7] Out: -   Physical Exam: HEENT - sclerae clear, mucous membranes moist Neck - soft Chest - coarse bilaterally Cor - RRR Abdomen - VAC intact with good seal, serosang output, no active bleeding Ext - no edema, non-tender Neuro - alert & oriented, no focal deficits  Lab Results:   Recent Labs  11/17/16 0418 11/18/16 0411  WBC 10.1 20.0*  HGB 10.1* 10.7*  HCT 31.1* 32.3*  PLT 239 288   BMET  Recent Labs  11/17/16 0418 11/18/16 0411  NA 140 138  K 4.0 4.7  CL 109 108  CO2 23 22  GLUCOSE 77 205*  BUN 44* 37*  CREATININE 1.03* 1.11*  CALCIUM 8.3* 7.9*   PT/INR No results for input(s): LABPROT, INR in the last 72 hours. Comprehensive  Metabolic Panel:    Component Value Date/Time   NA 138 11/18/2016 0411   NA 140 11/17/2016 0418   NA 141 01/13/2016 1354   NA 140 07/09/2015 1549   K 4.7 11/18/2016 0411   K 4.0 11/17/2016 0418   K 4.3 01/13/2016 1354   K 3.9 07/09/2015 1549   CL 108 11/18/2016 0411   CL 109 11/17/2016 0418   CL 108 (H) 12/28/2012 1343   CL 103 12/07/2012 1333   CO2 22 11/18/2016 0411   CO2 23 11/17/2016 0418   CO2 22 01/13/2016 1354   CO2 24 07/09/2015 1549   BUN 37 (H) 11/18/2016 0411   BUN 44 (H) 11/17/2016 0418   BUN 21.6 01/13/2016 1354   BUN 19.8 07/09/2015 1549   CREATININE 1.11 (H) 11/18/2016 0411   CREATININE 1.03 (H) 11/17/2016 0418   CREATININE 1.0 01/13/2016 1354   CREATININE 0.8 07/09/2015 1549   GLUCOSE 205 (H) 11/18/2016 0411   GLUCOSE 77 11/17/2016 0418   GLUCOSE 135 01/13/2016 1354   GLUCOSE 133 07/09/2015 1549   GLUCOSE 99 12/28/2012 1343   GLUCOSE 105 (H) 12/07/2012 1333   CALCIUM 7.9 (L) 11/18/2016 0411   CALCIUM 8.3 (L) 11/17/2016 0418   CALCIUM 9.6 01/13/2016 1354   CALCIUM 9.4 07/09/2015 1549   AST 27 11/15/2016 1850   AST 15 01/13/2016 1354  AST 16 07/09/2015 1549   ALT 26 11/15/2016 1850   ALT 11 01/13/2016 1354   ALT 13 07/09/2015 1549   ALKPHOS 64 11/15/2016 1850   ALKPHOS 75 01/13/2016 1354   ALKPHOS 68 07/09/2015 1549   BILITOT 0.8 11/15/2016 1850   BILITOT <0.30 01/13/2016 1354   BILITOT <0.30 07/09/2015 1549   PROT 7.8 11/15/2016 1850   PROT 6.9 01/13/2016 1354   PROT 7.0 07/09/2015 1549   ALBUMIN 3.9 11/15/2016 1850   ALBUMIN 3.5 01/13/2016 1354   ALBUMIN 3.4 (L) 07/09/2015 1549    Studies/Results: Nm Myocar Multi W/spect W/wall Motion / Ef  Result Date: 11/17/2016 CLINICAL DATA:  Hypertension, asthma, abdominal pain. Preop for surgery. EXAM: MYOCARDIAL IMAGING WITH SPECT (REST AND PHARMACOLOGIC-STRESS) GATED LEFT VENTRICULAR WALL MOTION STUDY LEFT VENTRICULAR EJECTION FRACTION TECHNIQUE: Standard myocardial SPECT imaging was performed after  resting intravenous injection of 10 mCi Tc-54m tetrofosmin. Subsequently, intravenous infusion of Lexiscan was performed under the supervision of the Cardiology staff. At peak effect of the drug, 30 mCi Tc-91m tetrofosmin was injected intravenously and standard myocardial SPECT imaging was performed. Quantitative gated imaging was also performed to evaluate left ventricular wall motion, and estimate left ventricular ejection fraction. COMPARISON:  None. FINDINGS: Perfusion: Decreased activity in the inferior wall on both sets of images likely due to scar. No reversible defects to suggest ischemia. Wall Motion: Decrease thickening and activity in the inferior wall. The remainder the left ventricle demonstrates normal wall motion with an myocardial thickening with contraction. Left Ventricular Ejection Fraction: 71 % End diastolic volume 66 ml End systolic volume 19 ml IMPRESSION: 1. Suspect inferior wall scar/infarction. No findings for ischemia. 2. Normal left ventricular wall motion except for the inferior wall. 3. Left ventricular ejection fraction 71% 4. Non invasive risk stratification*: Intermediate *2012 Appropriate Use Criteria for Coronary Revascularization Focused Update: J Am Coll Cardiol. 9628;36(6):294-765. http://content.airportbarriers.com.aspx?articleid=[email or web address] Electronically Signed   By: Marijo Sanes M.D.   On: 11/17/2016 12:28   Dg Chest Port 1 View  Result Date: 11/17/2016 CLINICAL DATA:  Central line placement, history of breast cancer, asthma, bronchitis. EXAM: PORTABLE CHEST 1 VIEW COMPARISON:  11/16/2016 FINDINGS: Right IJ central line catheter projects within the mid right atrium. No pneumothorax. Gastric tube extends into the expected location of the stomach with tip in the gastric antral region. Endotracheal tube tip is approximately 2.8 cm above the carina. Heart is borderline enlarged. Low lung volumes without pneumonic consolidation, CHF or pneumothorax. Left partially visualized  shoulder arthroplasty. 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 IMPRESSION: 1. Right IJ central line tip in the right atrium.  No pneumothorax. 2. Slightly low-lying endotracheal tube tip at 2.8 cm above the carina. 3. Gastric tube is in the expected location of the stomach. 4. No pulmonary disease. Electronically Signed   By: Ashley Royalty M.D.   On: 11/17/2016 20:19   Dg Chest Port 1 View  Result Date: 11/16/2016 CLINICAL DATA:  Shortness of breath. EXAM: PORTABLE CHEST 1 VIEW COMPARISON:  08/21/2015. FINDINGS: Trachea is midline. Heart size stable. Lungs are clear. No pleural fluid. Degenerative changes in the right shoulder. Left shoulder arthroplasty is partially imaged. IMPRESSION: No acute findings. Electronically Signed   By: Lorin Picket M.D.   On: 11/16/2016 12:17   Dg Abd 2 Views  Result Date: 11/16/2016 CLINICAL DATA:  Nausea and vomiting EXAM: ABDOMEN - 2 VIEW COMPARISON:  CT abdomen and pelvis Nov 15, 2016 FINDINGS: Supine and upright images obtained. There is  moderate stool throughout the colon. There are loops of dilated small bowel multiple air-fluid levels. No free air. There are minimal pleural effusions bilaterally. IMPRESSION: Bowel gas pattern indicative of a degree of bowel obstruction. No free air evident. Electronically Signed   By: Lowella Grip III M.D.   On: 11/16/2016 08:54   Dg Abd Portable 1v  Result Date: 11/17/2016 CLINICAL DATA:  Encounter for nasogastric tube placement EXAM: PORTABLE ABDOMEN - 1 VIEW COMPARISON:  11/16/2016 FINDINGS: The tip and side port of a gastric tube are in the expected location of the stomach with tip in the expected location of the distal stomach possibly the gastric antrum. No radio-opaque calculi or other significant radiographic abnormality are seen. A few scattered gas containing small and large bowel loops are noted. No no free air. The tip of a right-sided central line projects over the expected  location of the right atrium. IMPRESSION: Gastric tube in the expected location of the stomach. Right-sided central line tip is noted in right atrium. Electronically Signed   By: Ashley Royalty M.D.   On: 11/17/2016 20:21      Lorrie Strauch M 11/18/2016  Patient ID: Brandy [name], female   DOB: [birth year], 83 y.o.   MRN: [id number]

## 2016-11-18 NOTE — Procedures (Signed)
Extubation Procedure Note  Patient Details:   Name: ANAEL ROSCH DOB: 09-10-1932 MRN: [id number]   Airway Documentation:     Evaluation  O2 sats: stable throughout Complications: No apparent complications Patient did tolerate procedure well. Bilateral Breath Sounds: Clear, Diminished   Yes  Johnette Abraham 11/18/2016, 1:18 PM

## 2016-11-18 NOTE — Care Management Note (Signed)
Case Management Note  Patient Details  Name: SHANTANIQUE HODO MRN: [id number] Date of Birth: 07-04-1933  Subjective/Objective:    Resp. Failure intubated                Action/Plan: from home may need placement Date:  Nov 18, 2016 Chart reviewed for concurrent status and case management needs. Will continue to follow patient progress. Discharge Planning: following for needs Expected discharge date: [id number] Velva Harman, BSN, [place], [place]  Expected Discharge Date:  11/18/16               Expected Discharge Plan:  [place]  In-House Referral:  Clinical Social Work  Discharge planning Services     Post Acute Care Choice:    Choice offered to:     DME Arranged:    DME Agency:     HH Arranged:    [place] Agency:     Status of Service:  Completed, signed off  If discussed at [employer], dates discussed:    Additional Comments:  Leeroy Cha, RN 11/18/2016, 8:47 AM

## 2016-11-18 NOTE — Progress Notes (Signed)
Afternoon rounds.   Events.  Spoke w/ surgical team. She will not need to return to OR for further wound care.  Placed on PSV On my arrival I placed her on SBT--> her work of breathing is excellent. Her F/vt is strong and mental status supports extubation. I double checked w/ surgical team. Based on her current exam, ventilator mechanics and no further OR trips planned we will extubate this afternoon.   Plan Extubate to [place] Wean O2 Add IS  Change fent gtt to PRN.   My ccm time 30 minutes  Erick Colace ACNP-BC [place] Pager # [phone] OR # [phone] if no answer

## 2016-11-19 ENCOUNTER — Inpatient Hospital Stay (HOSPITAL_COMMUNITY): Payer: Medicare HMO

## 2016-11-19 DIAGNOSIS — E43 Unspecified severe protein-calorie malnutrition: Secondary | ICD-10-CM | POA: Diagnosis not present

## 2016-11-19 DIAGNOSIS — K430 Incisional hernia with obstruction, without gangrene: Principal | ICD-10-CM

## 2016-11-19 DIAGNOSIS — N179 Acute kidney failure, unspecified: Secondary | ICD-10-CM | POA: Diagnosis not present

## 2016-11-19 DIAGNOSIS — J9690 Respiratory failure, unspecified, unspecified whether with hypoxia or hypercapnia: Secondary | ICD-10-CM | POA: Diagnosis not present

## 2016-11-19 DIAGNOSIS — J95821 Acute postprocedural respiratory failure: Secondary | ICD-10-CM | POA: Diagnosis not present

## 2016-11-19 DIAGNOSIS — K460 Unspecified abdominal hernia with obstruction, without gangrene: Secondary | ICD-10-CM | POA: Diagnosis not present

## 2016-11-19 LAB — CBC WITH DIFFERENTIAL/PLATELET
Basophils Absolute: 0 10*3/uL (ref 0.0–0.1)
Basophils Relative: 0 %
Eosinophils Absolute: 0 10*3/uL (ref 0.0–0.7)
Eosinophils Relative: 0 %
HCT: 28 % — ABNORMAL LOW (ref 36.0–46.0)
HEMOGLOBIN: 9.3 g/dL — AB (ref 12.0–15.0)
LYMPHS ABS: 2.7 10*3/uL (ref 0.7–4.0)
LYMPHS PCT: 15 %
MCH: 29.5 pg (ref 26.0–34.0)
MCHC: 33.2 g/dL (ref 30.0–36.0)
MCV: 88.9 fL (ref 78.0–100.0)
Monocytes Absolute: 1.6 10*3/uL — ABNORMAL HIGH (ref 0.1–1.0)
Monocytes Relative: 9 %
NEUTROS PCT: 76 %
Neutro Abs: 13.4 10*3/uL — ABNORMAL HIGH (ref 1.7–7.7)
Platelets: 281 10*3/uL (ref 150–400)
RBC: 3.15 MIL/uL — AB (ref 3.87–5.11)
RDW: 17.1 % — ABNORMAL HIGH (ref 11.5–15.5)
WBC: 17.8 10*3/uL — AB (ref 4.0–10.5)

## 2016-11-19 LAB — PHOSPHORUS: Phosphorus: 2 mg/dL — ABNORMAL LOW (ref 2.5–4.6)

## 2016-11-19 LAB — MAGNESIUM: MAGNESIUM: 1.9 mg/dL (ref 1.7–2.4)

## 2016-11-19 LAB — BASIC METABOLIC PANEL
Anion gap: 6 (ref 5–15)
BUN: 41 mg/dL — AB (ref 6–20)
CHLORIDE: 108 mmol/L (ref 101–111)
CO2: 24 mmol/L (ref 22–32)
Calcium: 7.5 mg/dL — ABNORMAL LOW (ref 8.9–10.3)
Creatinine, Ser: 1.24 mg/dL — ABNORMAL HIGH (ref 0.44–1.00)
GFR calc Af Amer: 46 mL/min — ABNORMAL LOW (ref >60)
GFR calc non Af Amer: 40 mL/min — ABNORMAL LOW (ref >60)
GLUCOSE: 162 mg/dL — AB (ref 65–99)
Potassium: 4.4 mmol/L (ref 3.5–5.1)
Sodium: 138 mmol/L (ref 135–145)

## 2016-11-19 MED ORDER — ORAL CARE MOUTH RINSE
15.0000 mL | Freq: Two times a day (BID) | OROMUCOSAL | Status: DC
  Administered 2016-11-19 – 2016-11-25 (×8): 15 mL via OROMUCOSAL

## 2016-11-19 MED ORDER — CHLORHEXIDINE GLUCONATE 0.12 % MT SOLN
15.0000 mL | Freq: Two times a day (BID) | OROMUCOSAL | Status: DC
  Administered 2016-11-19 – 2016-11-25 (×12): 15 mL via OROMUCOSAL
  Filled 2016-11-19 (×9): qty 15

## 2016-11-19 NOTE — Progress Notes (Signed)
2 Days Post-Op   Subjective/Chief Complaint: Extubated. No real c/o. Family at bs. No flatus.    Objective: Vital signs in last 24 hours: Temp:  [97.8 F (36.6 C)-99.5 F (37.5 C)] 99.3 F (37.4 C) (05/17 0730) Pulse Rate:  [77-101] 88 (05/17 0600) Resp:  [15-26] 22 (05/17 0600) SpO2:  [91 %-98 %] 91 % (05/17 0600) Arterial Line BP: (108-136)/(39-104) 117/43 (05/17 0600) FiO2 (%):  [30 %] 30 % (05/16 1300) Last BM Date:  (Pt states last week, 5/9?)  Intake/Output from previous day: 05/16 0701 - 05/17 0700 In: 1824.7 [I.V.:1774.7; IV Piggyback:50] Out: 36 [Urine:605; Emesis/NG output:150; Drains:875] Intake/Output this shift: Total I/O In: 112.5 [I.V.:112.5] Out: -   Awake, alert FC cta ant  Reg Soft, obese, mild approp TTP, wound vac dressing intact - serosang fluid in cannister No edema, +SCDS  Lab Results:   Recent Labs  11/18/16 0411 11/19/16 0405  WBC 20.0* 17.8*  HGB 10.7* 9.3*  HCT 32.3* 28.0*  PLT 288 281   BMET  Recent Labs  11/18/16 0411 11/19/16 0405  NA 138 138  K 4.7 4.4  CL 108 108  CO2 22 24  GLUCOSE 205* 162*  BUN 37* 41*  CREATININE 1.11* 1.24*  CALCIUM 7.9* 7.5*   PT/INR No results for input(s): LABPROT, INR in the last 72 hours. ABG  Recent Labs  11/17/16 1950  PHART 7.293*  HCO3 20.9    Studies/Results: Nm Myocar Multi W/spect W/wall Motion / Ef  Result Date: 11/17/2016 CLINICAL DATA:  Hypertension, asthma, abdominal pain. Preop for surgery. EXAM: MYOCARDIAL IMAGING WITH SPECT (REST AND PHARMACOLOGIC-STRESS) GATED LEFT VENTRICULAR WALL MOTION STUDY LEFT VENTRICULAR EJECTION FRACTION TECHNIQUE: Standard myocardial SPECT imaging was performed after resting intravenous injection of 10 mCi Tc-110m tetrofosmin. Subsequently, intravenous infusion of Lexiscan was performed under the supervision of the Cardiology staff. At peak effect of the drug, 30 mCi Tc-74m tetrofosmin was injected intravenously and standard myocardial SPECT  imaging was performed. Quantitative gated imaging was also performed to evaluate left ventricular wall motion, and estimate left ventricular ejection fraction. COMPARISON:  None. FINDINGS: Perfusion: Decreased activity in the inferior wall on both sets of images likely due to scar. No reversible defects to suggest ischemia. Wall Motion: Decrease thickening and activity in the inferior wall. The remainder the left ventricle demonstrates normal wall motion with an myocardial thickening with contraction. Left Ventricular Ejection Fraction: 71 % End diastolic volume 66 ml End systolic volume 19 ml IMPRESSION: 1. Suspect inferior wall scar/infarction. No findings for ischemia. 2. Normal left ventricular wall motion except for the inferior wall. 3. Left ventricular ejection fraction 71% 4. Non invasive risk stratification*: Intermediate *2012 Appropriate Use Criteria for Coronary Revascularization Focused Update: J Am Coll Cardiol. 2355;73(2):202-542. http://content.airportbarriers.com.aspx?articleid=[email or web address] Electronically Signed   By: Marijo Sanes M.D.   On: 11/17/2016 12:28   Dg Chest Port 1 View  Result Date: 11/19/2016 CLINICAL DATA:  Respiratory failure. EXAM: PORTABLE CHEST 1 VIEW COMPARISON:  11/17/2016. FINDINGS: Interim extubation. Right IJ line NG tube in stable position. Heart size normal. Low lung volumes with mild basilar atelectasis. No focal infiltrate. No pleural effusion or pneumothorax. Surgical clips are noted over the chest. Left shoulder replacement. IMPRESSION: 1. Interim extubation.  Right IJ line NG tube in stable position. 2. Low lung volumes with mild basilar atelectasis. Electronically Signed   By: Marcello Moores  Register   On: 11/19/2016 06:55   Dg Chest Port 1 View  Result Date: 11/17/2016 CLINICAL DATA:  Central line placement, history  of breast cancer, asthma, bronchitis. EXAM: PORTABLE CHEST 1 VIEW COMPARISON:  11/16/2016 FINDINGS: Right IJ central line catheter projects within the mid  right atrium. No pneumothorax. Gastric tube extends into the expected location of the stomach with tip in the gastric antral region. Endotracheal tube tip is approximately 2.8 cm above the carina. Heart is borderline enlarged. Low lung volumes without pneumonic consolidation, CHF or pneumothorax. Left partially visualized shoulder arthroplasty. 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 IMPRESSION: 1. Right IJ central line tip in the right atrium.  No pneumothorax. 2. Slightly low-lying endotracheal tube tip at 2.8 cm above the carina. 3. Gastric tube is in the expected location of the stomach. 4. No pulmonary disease. Electronically Signed   By: Ashley Royalty M.D.   On: 11/17/2016 20:19   Dg Abd Portable 1v  Result Date: 11/17/2016 CLINICAL DATA:  Encounter for nasogastric tube placement EXAM: PORTABLE ABDOMEN - 1 VIEW COMPARISON:  11/16/2016 FINDINGS: The tip and side port of a gastric tube are in the expected location of the stomach with tip in the expected location of the distal stomach possibly the gastric antrum. No radio-opaque calculi or other significant radiographic abnormality are seen. A few scattered gas containing small and large bowel loops are noted. No no free air. The tip of a right-sided central line projects over the expected location of the right atrium. IMPRESSION: Gastric tube in the expected location of the stomach. Right-sided central line tip is noted in right atrium. Electronically Signed   By: Ashley Royalty M.D.   On: 11/17/2016 20:21    Anti-infectives: Anti-infectives    Start     Dose/Rate Route Frequency Ordered Stop   11/18/16 0600  ceFAZolin (ANCEF) IVPB 2g/100 mL premix     2 g 200 mL/hr over 30 Minutes Intravenous On call to O.R. 11/17/16 1301 11/17/16 1458   11/17/16 2000  ertapenem (INVANZ) 1 g in sodium chloride 0.9 % 50 mL IVPB     1 g 100 mL/hr over 30 Minutes Intravenous Every 24 hours 11/17/16 1910     11/17/16 1410  ceFAZolin  (ANCEF) 2-4 GM/100ML-% IVPB    Comments:  Virgia [place]   : cabinet override      11/17/16 1410 11/18/16 0214      Assessment/Plan: s/p POD#2 - status post ex lap, lysis of adhesions, small bowel resection, repair ventral inc hernia with biologic patch  Would keep in SDU Wound care consult for wound vac Maintain bowel rest with NG tube, may have ice chips rec PT/OT consult Can start chemical prophylactic VTE pulm toilet  Leighton Ruff. Redmond Pulling, MD, FACS General, Bariatric, & Minimally Invasive Surgery [facility], [place]   LOS: 4 days    Gayland Curry 11/19/2016

## 2016-11-19 NOTE — Progress Notes (Signed)
Nutrition Follow-up  DOCUMENTATION CODES:   Severe malnutrition in context of acute illness/injury, Obesity unspecified  INTERVENTION:  - Diet advancement as medically feasible. - RD will continue to monitor for additional needs.  NUTRITION DIAGNOSIS:   Malnutrition (severe) related to acute illness (incarcerated ventral hernia) as evidenced by energy intake < or equal to 50% for > or equal to 5 days, percent weight loss. -revised  GOAL:   Patient will meet greater than or equal to 90% of their needs -unable to meet  MONITOR:   Diet advancement, Weight trends, Labs, Skin, I & O's  ASSESSMENT:   83 y.o. female with medical history significant of HTN, HLD, remote breast cancer, GERD, and gallstones without h/o cholecystectomy presenting with n/v which began last Tuesday night.  She has been vomiting ever since.  Called Dr. Ardis Hughs, who called in some nausea pills which helped a bit.  Vomited about 2-3 times daily.  Pain in the area around her ventral hernia since Tuesday.  Pain is better following hernia reduction in the ER.  Last BM was Wednesday or Thursday and was normal; it is unusual for her to go so long between BMs. On 5/15 pt underwent ex lap with LOA and repair of incarcerated ventral hernia.   5/17 Pt was extubated yesterday ~1315. NGT remains in place with ~300cc output at this time. Per Dr. Gala Lewandowsky note this AM, plan to keep NGT and allow pt to have ice chips and no plans at this time to return to the OR. Pt resting during RD visit but husband was at bedside and able to provide PTA information. He states that for 1-1.5 weeks pt was complaining of abdominal pain. During that time frame she was mainly sleeping all day and would only take a few small bites of food a few times each day but that sleeping increased and eating decreased each day in the couple of days PTA. He is unsure if eating increased abdominal pain.   Medications reviewed; 20 mg IV Pepcid/day. Labs reviewed; BUN:  41 mg/dL, creatinine: 1.24 mg/dL, Ca: 7.5 mg/dL, Phos: 2 mg/dL, GFR: 40 mL/min.  IVF: D5-1/2 NS 2 75 mL/hr (306 kcal from dextrose).   5/16 - Pt is POD #1 ex lap with lysis of adhesions, small bowel resection, repair of incarcerated ventral hernia, and biologic patch placement.  - NGT in place to LIS with 100cc light brown/tan output present at this time.  - Pt awake and able to indicate that she is not experiencing abdominal pain at this time.  - No family/visitors present to provide PTA information.  - Physical assessment shows no muscle and no fat wasting.   - Per chart review, pt has lost 16 lbs (9% body weight) in the past 2 months which is significant for time frame.  Patient is currently intubated on ventilator support MV: 9.7 L/min Temp (24hrs), Avg:98.4 F (36.9 C), Min:97.4 F (36.3 C), Max:99.7 F (37.6 C) Propofol: none IVF: D5-1/2 NS @ 75 mL/hr (306 kcal from dextrose).    Diet Order:  Diet NPO time specified  Skin:  Wound (see comment) (Abdominal incision with wound vac from 11/17/16)  Last BM:  PTA/unknown  Height:   Ht Readings from Last 1 Encounters:  11/17/16 4\' 11"  (1.499 m)    Weight:   Wt Readings from Last 1 Encounters:  11/16/16 160 lb 15 oz (73 kg)    Ideal Body Weight:  42.91 kg  BMI:  Body mass index is 32.51 kg/m.  Estimated  Nutritional Needs:   Kcal:  [phone] (11-14 kcal/kg)  Protein:  86 grams (2 grams/kg IBW)  Fluid:  >/= 1.7 L/day  EDUCATION NEEDS:   No education needs identified at this time    Jarome Matin, MS, RD, LDN, CNSC Inpatient Clinical Dietitian Pager # [phone] After hours/weekend pager # [phone]

## 2016-11-19 NOTE — Consult Note (Addendum)
[facility] Nurse wound consult note Reason for Consult: Consult requested for first post-op dressing to abd wound.  Surgical team following and the PA and Dr Harlow Asa were at the bedside to assess the wound  Wound type: Full thickness Measurement: 20X24X3cm with tunneling to left and right abd from approx 2:00 o'clock to 5:00 o'clock and 7:00 o'clock to 11:00 o'clock, to 9 cm on each side of the wound underneath skin flaps.  Middle of the wound with visible white biologic mesh layer,.  rest of wound is beefy red Drainage (amount, consistency, odor) Mod amt pink drainage in the cannister (450cc) Periwound: Intact skin surrounding Dressing procedure/placement/frequency: Pt was medicated prior to the procedure but it was still very painful.   Removed 2 large Ab-thera Vac layers which were previously placed in the OR, and replaced with Mepitel contact layer over the mesh in the center of the wound, then 2 white foam dressings, then a large black foam dressing to 124mm cont suction.  Discussed dressing change schedule with the surgeon: [place] team will plan to change dressing on Monday, and begin a M/W/F schedule at that time. Family member at the bedside discussed plan of care with the physician. Julien Girt MSN, RN, [place], [place], [place]

## 2016-11-19 NOTE — Op Note (Signed)
NAME:  Brandy [name], Brandy [name]                    ACCOUNT NO.:  MEDICAL RECORD NO.:  [id number]  LOCATION:                                 FACILITY:  PHYSICIAN:  Earnstine Regal, MD           DATE OF BIRTH:  DATE OF PROCEDURE:  11/17/2016                              OPERATIVE REPORT   PREOPERATIVE DIAGNOSIS:  Incarcerated ventral incisional hernia, small bowel obstruction.  POSTOPERATIVE DIAGNOSIS:  Incarcerated ventral incisional hernia, small bowel obstruction.  PROCEDURES: 1. Exploratory laparotomy. 2. Lysis of adhesions (45 minutes). 3. Small bowel resection with primary anastomosis. 4. Repair of incarcerated ventral incisional hernia with insertion of     biologic mesh patch. 5. Application of vacuum dressing.  SURGEON:  Earnstine Regal, MD  ANESTHESIA:  General per Dr. Roberts Gaudy.  ESTIMATED BLOOD LOSS:  100 mL.  PREPARATION:  ChloraPrep.  COMPLICATIONS:  None.  INDICATIONS:  The patient is an 83 year old female, who was admitted with small bowel obstruction.  CT scan showed multiple ventral incisional hernias with the point of obstruction being within one of the incisional hernia sacs.  The patient required cardiac evaluation preoperatively.  She was then prepared and brought to the operating room for laparotomy.  BODY OF REPORT:  Procedure is done in OR #4 at the [facility].  The patient is brought to the operating room, placed in supine position on the operating room table.  Following administration of general anesthesia, the patient is positioned and then prepped and draped in the usual aseptic fashion.  After ascertaining that an adequate level of anesthesia had been achieved, a midline abdominal scar is reopened with a #10 blade.  Dissection is carried through scar tissue into subcutaneous tissues.  Hemostasis is achieved with the electrocautery.  Dissection is carried down to the fascia.  There appear to be at least 4 fascial defects in the  anterior abdominal wall.  An area of intact fascia in the mid abdominal wall is incised and the peritoneal cavity is cautiously entered.  Incision is then extended cephalad and caudad connecting into the ventral hernia sacs.  Two of the hernias contain a significant amount of small intestine which is adherent to the rim of the hernia.  This is taken down with sharp dissection and use of the electrocautery allowing for complete mobilization of the bowel back within the peritoneal cavity.  In the larger hernia defect in the left lower quadrant, the small bowel is densely adherent to the rim of the hernia sac.  A second loop of bowel extends well into the hernia sac down into the left lower quadrant. This loop has 2 points of stricturing and obstruction present. Adhesions are lysed and the loop of bowel is delivered back within the peritoneal cavity.  All of the bowel appears to be viable.  The remaining point of obstruction at the rim of the left lower quadrant ventral hernia is densely adherent, and upon mobilization, there is perforation which may have represented a contained perforation.  There is spillage of a large volume of vegetative matter from the proximal small bowel.  This is evacuated.  A segmental resection of this portion of the small bowel is performed. This appears to be in the distal jejunum.  A point proximal and distal to the site of perforation is transected with a GIA stapler.  Mesentery is divided with the LigaSure.  The specimen is excised and submitted to Pathology for review, labeled as segment of small intestine with perforation.  Next, a side-to-side functionally end-to-end anastomosis is created in the small bowel using a GIA stapler.  Good hemostasis is noted along the staple line.  Enterotomy is closed with a TA-60 stapler.  Mesenteric defect is closed with interrupted 3-0 silk sutures.  Anastomosis appears to be widely patent.  Small bowel is then returned  to the peritoneal cavity.  No other point of obstruction is evident.  The abdominal cavity is irrigated with warm saline and evacuated.  Next, the fascia requires dissection from underlying adhesions bilaterally.  This is extensive and required approximately 45 minutes in order to elevate the entire abdominal wall circumferentially and beyond the hernia defects.  Hernia sacs are excised from the subcutaneous tissues to a large degree.  Hemostasis is achieved with the electrocautery.  Skin flaps are then developed bilaterally.  Relaxing incisions are made in the anterior fascia allowing for centralization of the abdominal wall in hopes of performing primary closure. Unfortunately, the medial edges of the fascia are quite thin and unable to hold suture.  There are at least 4 fascial defects in the midline. Fascia is debrided back to healthy viable tissue.  An attempt was made to close the midline incision with interrupted #1 Novafil sutures placed inferiorly and superiorly in the midline.  However, there is just too much tension and the sutures begin to tear through the fascia.  There is approximately an 8 cm gap between the fascial edges.  Decision was made to use the Strattice biologic prosthetic as a bridging structure to allow for closure of the abdomen.  A sheet of Strattice is then prepared in the proper fashion and brought onto the field.  It is trimmed to the appropriate dimensions.  It is secured in an underlayment fashion around the entire fascial defect with interrupted mattress sutures of #1 Novafil.  This provides for good coverage of the defect.  Wound is then irrigated with warm saline which is evacuated.  Good hemostasis is noted.  Due to the large subcutaneous space that had been created and the fact that there had been contamination, a vacuum dressing is applied in the subcutaneous space.  This is placed to suction.  The patient is transported from the operating room  to the intensive care unit by Anesthesia, intubated and sedated.  Critical Care Medicine will be consulted in addition to [facility] for postoperative management.  The patient remained stable throughout the operative procedure and tolerated the procedure well from a cardiovascular standpoint.  Earnstine Regal, MD, [facility] Office: [phone]   TMG/MEDQ  D:  11/17/2016  T:  11/18/2016  Job:  [id number]

## 2016-11-19 NOTE — Progress Notes (Addendum)
PULMONARY / CRITICAL CARE MEDICINE   Name: Brandy [name] MRN: [id number] DOB: 09/04/1932    ADMISSION DATE:  11/15/2016 CONSULTATION DATE:  11/17/16  REFERRING MD:  Harlow Asa  CHIEF COMPLAINT:  Post op vent management  HISTORY OF PRESENT ILLNESS:   Brandy [name] is a 83 y.o. female  presented to [facility] 5/13 with small bowel obstruction due to incarcerated bowel related to a chronic ventral hernia  She was taken to the OR 5/15 for ex lap with lysis of adhesions, small bowel resection, and repair of incarcerated hernia with insertion of biologic mesh patch.  She had wound vac placed and returned to the ICU on the ventilator.   SUBJECTIVE:  Extubated, did well UO picking up afebrile  VITAL SIGNS: BP (!) 125/48   Pulse 88   Temp 99.3 F (37.4 C) (Oral)   Resp (!) 22   Ht 4\' 11"  (1.499 m)   Wt 160 lb 15 oz (73 kg)   SpO2 91%   BMI 32.51 kg/m   HEMODYNAMICS:    VENTILATOR SETTINGS: Vent Mode: PSV;CPAP FiO2 (%):  [30 %] 30 % PEEP:  [5 cmH20] 5 cmH20 Pressure Support:  [10 cmH20] 10 cmH20  INTAKE / OUTPUT: I/O last 3 completed shifts: In: 2964.9 [I.V.:2814.9; IV Piggyback:150] Out: 2150 [Urine:925; Emesis/NG output:150; Drains:1075]   PHYSICAL EXAMINATION: General: Elderly female, in NAD. Neuro: lethargic, but wakes up , interactive, non focal HEENT: Brandy [name]/AT. PERRL, sclerae anicteric. Cardiovascular: RRR, 3/6 ESM at base Lungs: Respirations even and unlabored.  CTA bilaterally, No W/R/R.  Abdomen: Abdominal incision  with wound vac in place. Musculoskeletal: No gross deformities, no edema.  Skin: Intact, warm, no rashes.  LABS:  BMET  Recent Labs Lab 11/17/16 0418 11/18/16 0411 11/19/16 0405  NA 140 138 138  K 4.0 4.7 4.4  CL 109 108 108  CO2 23 22 24   BUN 44* 37* 41*  CREATININE 1.03* 1.11* 1.24*  GLUCOSE 77 205* 162*    Electrolytes  Recent Labs Lab 11/17/16 0418 11/18/16 0411 11/19/16 0405  CALCIUM 8.3* 7.9* 7.5*  MG  --  1.8 1.9  PHOS  --  3.8  2.0*    CBC  Recent Labs Lab 11/17/16 0418 11/18/16 0411 11/19/16 0405  WBC 10.1 20.0* 17.8*  HGB 10.1* 10.7* 9.3*  HCT 31.1* 32.3* 28.0*  PLT 239 288 281    Coag's No results for input(s): APTT, INR in the last 168 hours.  Sepsis Markers  Recent Labs Lab 11/15/16 2119  LATICACIDVEN 1.72    ABG  Recent Labs Lab 11/17/16 1950  PHART 7.293*  PCO2ART 44.2  PO2ART 142*    Liver Enzymes  Recent Labs Lab 11/15/16 1850  AST 27  ALT 26  ALKPHOS 64  BILITOT 0.8  ALBUMIN 3.9    Cardiac Enzymes No results for input(s): TROPONINI, PROBNP in the last 168 hours.  Glucose No results for input(s): GLUCAP in the last 168 hours.  Imaging CXR reviewed 5/17   STUDIES:  CT A / P 5/15 > 3 ventral / periumbilical hernias containing small bowel, incarceration is not entirely excluded, high grade SBO. Nuc Med 5/15 > no ischemia, LVEF 71%.  CULTURES: None.  ANTIBIOTICS: Ancef 5/15 x 1 Ertapenem 5/15 >   SIGNIFICANT EVENTS: 5/13 > admit. 5/15 > to OR  LINES/TUBES: ETT 5/15 > 5/16 R IJ CVL 5/15 >   DISCUSSION: 83 y.o. female admitted 5/13 with SBO and incarcerated hernia, taken to OR 5/15 for ex-lap with lysis of adhesions,  small bowel resection, and repair of incarcerated hernia with insertion of biologic mesh patch.  Has done well p-extubation  ASSESSMENT / PLAN:  GASTROINTESTINAL A:   SBO with incarcerated hernia - s/p ex lap with lysis of adhesions, small bowel resection, and repair of incarcerated hernia with insertion of biologic mesh patch 5/15 . GERD. Nutrition. Hx dysphagia. P:   Post op care, wound vac per [facility]. SUP: Pantoprazole. Ice chips & advance  PULMONARY A: Post op resp failure -resolved Hx asthma. P:   Incentive spirometry VAP prevention measures.   CARDIOVASCULAR A:  Hx HTN, HLD, AS. DNR Status. Mod AS P:  Monitor hemodynamics. Hold preadmission amlodipine, spironolactone, diovan.  RENAL A:   AKI - improving P:    replete electrolytes as indicated. BMP daily Ct D51/2 NS   HEMATOLOGIC / ONCOLOGIC A:   VTE Prophylaxis. Hx radiation therapy to left breast. P:  SCD's. CBC in AM.  INFECTIOUS A:   Peritonitis. P:   Ct ertapenem x 7 ds.   ENDOCRINE A:   Hyperglycemia - no hx DM. P:   SSI if glucose consistently > 180.  NEUROLOGIC A:   Acute encephalopathy - due to sedation. Hx depression. P:   Hold preadmission citalopram, gabapentin.  Family updated: son Brandy [name] at bedside  Interdisciplinary Family Meeting v Palliative Care Meeting:  done   Brandy Mead MD. [facility]. [place] [facility] Pager [phone] If no response call [phone]    11/19/2016, 8:47 AM

## 2016-11-19 NOTE — Progress Notes (Signed)
     Wound vac removed, site looks good, we are replacing wound vac.  Will leave the one we put in today till Monday and switch her to MWF dressing changes.  Maryfrances Bunnell, RN Wound Care is doing the wound care dressing/Vac change today.

## 2016-11-20 DIAGNOSIS — K430 Incisional hernia with obstruction, without gangrene: Secondary | ICD-10-CM | POA: Diagnosis not present

## 2016-11-20 DIAGNOSIS — N179 Acute kidney failure, unspecified: Secondary | ICD-10-CM | POA: Diagnosis not present

## 2016-11-20 DIAGNOSIS — E43 Unspecified severe protein-calorie malnutrition: Secondary | ICD-10-CM | POA: Diagnosis not present

## 2016-11-20 DIAGNOSIS — K460 Unspecified abdominal hernia with obstruction, without gangrene: Secondary | ICD-10-CM | POA: Diagnosis not present

## 2016-11-20 DIAGNOSIS — J95821 Acute postprocedural respiratory failure: Secondary | ICD-10-CM | POA: Diagnosis not present

## 2016-11-20 LAB — BASIC METABOLIC PANEL
ANION GAP: 6 (ref 5–15)
BUN: 21 mg/dL — ABNORMAL HIGH (ref 6–20)
CO2: 24 mmol/L (ref 22–32)
Calcium: 7.6 mg/dL — ABNORMAL LOW (ref 8.9–10.3)
Chloride: 110 mmol/L (ref 101–111)
Creatinine, Ser: 0.7 mg/dL (ref 0.44–1.00)
GFR calc Af Amer: >60 mL/min (ref >60)
GFR calc non Af Amer: >60 mL/min (ref >60)
GLUCOSE: 146 mg/dL — AB (ref 65–99)
POTASSIUM: 3.8 mmol/L (ref 3.5–5.1)
Sodium: 140 mmol/L (ref 135–145)

## 2016-11-20 LAB — CBC WITH DIFFERENTIAL/PLATELET
BASOS ABS: 0 10*3/uL (ref 0.0–0.1)
Basophils Relative: 0 %
Eosinophils Absolute: 0.1 10*3/uL (ref 0.0–0.7)
Eosinophils Relative: 1 %
HEMATOCRIT: 27 % — AB (ref 36.0–46.0)
Hemoglobin: 8.8 g/dL — ABNORMAL LOW (ref 12.0–15.0)
LYMPHS PCT: 13 %
Lymphs Abs: 1.9 10*3/uL (ref 0.7–4.0)
MCH: 28.9 pg (ref 26.0–34.0)
MCHC: 32.6 g/dL (ref 30.0–36.0)
MCV: 88.8 fL (ref 78.0–100.0)
Monocytes Absolute: 1.1 10*3/uL — ABNORMAL HIGH (ref 0.1–1.0)
Monocytes Relative: 8 %
Neutro Abs: 11.5 10*3/uL — ABNORMAL HIGH (ref 1.7–7.7)
Neutrophils Relative %: 78 %
Platelets: 268 10*3/uL (ref 150–400)
RBC: 3.04 MIL/uL — AB (ref 3.87–5.11)
RDW: 17.1 % — AB (ref 11.5–15.5)
WBC: 14.6 10*3/uL — ABNORMAL HIGH (ref 4.0–10.5)

## 2016-11-20 LAB — PHOSPHORUS: Phosphorus: 1.8 mg/dL — ABNORMAL LOW (ref 2.5–4.6)

## 2016-11-20 LAB — MAGNESIUM: Magnesium: 2 mg/dL (ref 1.7–2.4)

## 2016-11-20 MED ORDER — DEXTROSE 5 % IV SOLN
30.0000 mmol | Freq: Once | INTRAVENOUS | Status: AC
  Administered 2016-11-20: 30 mmol via INTRAVENOUS
  Filled 2016-11-20: qty 10

## 2016-11-20 NOTE — Progress Notes (Signed)
```````````````````Physical Therapy Treatment Patient Details Name: Brandy [name] MRN: [id number] DOB: [birth year] Today's Date: 11/20/2016    History of Present Illness 83 yo female admitted 5/13 with SBO, incarcerated hernia. S/P exploratory lap 11/18/16 for repair of hernia, Small Bowel resection  lysis of adhesions.    PT Comments     Assisted back to bed with 2 max-total assist. May benefit from SNF if agreeable and requires mod assist.    Follow Up Recommendations  Home health PT;SNF;Supervision/Assistance - 24 hour     Equipment Recommendations  Rolling walker with 5" wheels    Recommendations for Other Services OT consult     Precautions / Restrictions Precautions Precautions: Fall Precaution Comments: abdominal surgery, NG suction    Mobility  Bed Mobility Overal bed mobility: Needs Assistance Bed Mobility: Sit to Supine     Supine to sit: +2 for safety/equipment;+2 for physical assistance;HOB elevated Sit to supine: Total assist;+2 for physical assistance;+2 for safety/equipment   General bed mobility comments: assist with trunk, and legs. cues to try to roll.   Transfers Overall transfer level: Needs assistance Equipment used: 2 person hand held assist Transfers: Sit to/from [facility] Sit to Stand: Max assist;+2 physical assistance;+2 safety/equipment Stand pivot transfers: Max assist;+2 physical assistance;+2 safety/equipment       General transfer comment: Bear hug lift from recliner to pivot to bed, patient is short and does not sit onto bed well.   Ambulation/Gait                 Stairs            Wheelchair Mobility    Modified Rankin (Stroke Patients Only)       Balance Overall balance assessment: Needs assistance Sitting-balance support: Feet supported;Bilateral upper extremity supported Sitting balance-Leahy Scale: Fair     Standing balance support: During functional activity;Bilateral upper extremity  supported Standing balance-Leahy Scale: Poor                              Cognition Arousal/Alertness: Awake/alert Behavior During Therapy: WFL for tasks assessed/performed Overall Cognitive Status: Impaired/Different from baseline Area of Impairment: Orientation                 Orientation Level: Time                    Exercises      General Comments        Pertinent Vitals/Pain Pain Assessment: Faces Faces Pain Scale: Hurts little more Pain Location: abdomen Pain Descriptors / Indicators: Tightness Pain Intervention(s): Monitored during session;Premedicated before session    Home Living Family/patient expects to be discharged to:: Private residence Living Arrangements: Spouse/significant other Available Help at Discharge: Family Type of Home: House Home Access: Stairs to enter Entrance Stairs-Rails: Right;Left Home Layout: One level Home Equipment: Cane - single point      Prior Function Level of Independence: Independent with assistive device(s)          PT Goals (current goals can now be found in the care plan section) Acute Rehab PT Goals Patient Stated Goal: I'll get up. I don't know about rehab PT Goal Formulation: With patient/family Time For Goal Achievement: 12/04/16 Potential to Achieve Goals: Fair Progress towards PT goals: Progressing toward goals    Frequency    Min 3X/week      PT Plan Current plan remains appropriate    Co-evaluation  AM-PAC PT "6 Clicks" Daily Activity  Outcome Measure  Difficulty turning over in bed (including adjusting bedclothes, sheets and blankets)?: A Lot Difficulty moving from lying on back to sitting on the side of the bed? : A Lot Difficulty sitting down on and standing up from a chair with arms (e.g., wheelchair, bedside commode, etc,.)?: A Lot Help needed moving to and from a bed to chair (including a wheelchair)?: A Lot Help needed walking in hospital room?:  A Lot Help needed climbing 3-5 steps with a railing? : Total 6 Click Score: 11    End of Session   Activity Tolerance: Patient tolerated treatment well Patient left: in bed;with call bell/phone within reach;with bed alarm set;with nursing/sitter in room Nurse Communication: Mobility status PT Visit Diagnosis: Difficulty in walking, not elsewhere classified (R26.2);Pain     Time: 5027-7412 PT Time Calculation (min) (ACUTE ONLY): 13 min  Charges:  $Therapeutic Activity: 8-22 mins                    G CodesTresa Endo PT [phone] }   Claretha Cooper 11/20/2016, 12:11 PM

## 2016-11-20 NOTE — Evaluation (Addendum)
Physical Therapy Evaluation Patient Details Name: Brandy [name] MRN: [id number] DOB: Oct 21, 1932 Today's Date: 11/20/2016   History of Present Illness  83 yo female admitted 5/13 with SBO, incarcerated hernia. S/P exploratory lap 11/18/16 for repair of hernia, Small Bowel resection  lysis of adhesions.  Clinical Impression  The patient did bear some weight through legs for stand and pivot steps to recliner. Pt admitted with above diagnosis. Pt currently with functional limitations due to the deficits listed below (see PT Problem List). Pt will benefit from skilled PT to increase their independence and safety with mobility to allow discharge to the venue listed below.       Follow Up Recommendations Home health PT;SNF;Supervision/Assistance - 24 hour    Equipment Recommendations  Rolling walker with 5" wheels    Recommendations for Other Services   OT    Precautions / Restrictions Precautions Precautions:Precaution Comments: abdominal surgery, NG suction      Mobility  Bed Mobility Overal bed mobility: Needs Assistance Bed Mobility: Supine to Sit;Sit to Supine     Supine to sit: +2 for safety/equipment;+2 for physical assistance;HOB elevated     General bed mobility comments: assist with trunk, cues to try to roll.   Transfers Overall transfer level: Needs assistance   Transfers: Sit to/from Stand;Stand Pivot Transfers Sit to Stand: Max assist;+2 physical assistance;+2 safety/equipment Stand pivot transfers: Max assist;+2 physical assistance;+2 safety/equipment       General transfer comment: hand hold assist with 2 persons to power up to stand, pivot to recliner  Ambulation/Gait                Stairs            Wheelchair Mobility    Modified Rankin (Stroke Patients Only)       Balance Overall balance assessment: Needs assistance Sitting-balance support: Feet supported;Bilateral upper extremity supported Sitting balance-Leahy Scale: Fair      Standing balance support: During functional activity;Bilateral upper extremity supported Standing balance-Leahy Scale: Poor                               Pertinent Vitals/Pain Pain Assessment: Faces Faces Pain Scale: Hurts a little bit Pain Location: abdomen Pain Descriptors / Indicators: Tightness Pain Intervention(s): Monitored during session;Premedicated before session    Home Living Family/patient expects to be discharged to:: Private residence Living Arrangements: Spouse/significant other Available Help at Discharge: Family Type of Home: House Home Access: Stairs to enter Entrance Stairs-Rails: Psychiatric nurse of Steps: 3 Home Layout: One level Home Equipment: Cane - single point      Prior Function Level of Independence: Independent with assistive device(s)               Hand Dominance        Extremity/Trunk Assessment   Upper Extremity Assessment Upper Extremity Assessment: Generalized weakness    Lower Extremity Assessment Lower Extremity Assessment: Generalized weakness    Cervical / Trunk Assessment Cervical / Trunk Assessment: Kyphotic  Communication   Communication: No difficulties  Cognition Arousal/Alertness: Lethargic;Suspect due to medications Behavior During Therapy: [facility] for tasks assessed/performed Overall Cognitive Status: Impaired/Different from baseline Area of Impairment: Orientation                 Orientation Level: Time                    General Comments      Exercises  Assessment/Plan    PT Assessment Patient needs continued PT services  PT Problem List Decreased strength;Decreased activity tolerance;Decreased range of motion;Decreased mobility;Decreased safety awareness;Decreased knowledge of use of DME;Pain;Decreased knowledge of precautions       PT Treatment Interventions DME instruction;Gait training;Stair training;Functional mobility training;Therapeutic  activities;Therapeutic exercise;Patient/family education    PT Goals (Current goals can be found in the Care Plan section)  Acute Rehab PT Goals Patient Stated Goal: I'll get up. I don't know about rehab PT Goal Formulation: With patient/family Time For Goal Achievement: 12/04/16 Potential to Achieve Goals: Fair    Frequency Min 3X/week   Barriers to discharge        Co-evaluation               AM-PAC PT "6 Clicks" Daily Activity  Outcome Measure Difficulty turning over in bed (including adjusting bedclothes, sheets and blankets)?: A Lot Difficulty moving from lying on back to sitting on the side of the bed? : A Lot Difficulty sitting down on and standing up from a chair with arms (e.g., wheelchair, bedside commode, etc,.)?: A Lot Help needed moving to and from a bed to chair (including a wheelchair)?: A Lot Help needed walking in hospital room?: A Lot Help needed climbing 3-5 steps with a railing? : Total 6 Click Score: 11    End of Session   Activity Tolerance: Patient tolerated treatment well Patient left: in chair;with call bell/phone within reach;with chair alarm set;with family/visitor present Nurse Communication: Mobility status PT Visit Diagnosis: Difficulty in walking, not elsewhere classified (R26.2);Pain    Time: 3383-2919 PT Time Calculation (min) (ACUTE ONLY): 22 min   Charges:   PT Evaluation $PT Eval Moderate Complexity: 1 Procedure     PT G CodesTresa Endo PT [phone]   Claretha Cooper 11/20/2016, 12:06 PM

## 2016-11-20 NOTE — Progress Notes (Signed)
[facility] Progress Note Patient Name: Brandy [name] DOB: [birth year] MRN: [id number]   Date of Service  11/20/2016  HPI/Events of Note  Hypophos and potassium of 3.8  eICU Interventions  Phos replaced with KPhos 30 mmol     Intervention Category Intermediate Interventions: Electrolyte abnormality - evaluation and management  DETERDING,ELIZABETH 11/20/2016, 4:53 AM

## 2016-11-20 NOTE — Progress Notes (Signed)
General Surgery [facility]  Assessment & Plan: POD#2 - status post ex lap, LOA, small bowel resection, repair ventral hernia with Strattice patch  NPO, NG decompression  VAC dressing to abdominal wall wound (not abdominal VAC) - per [place], change Monday         Earnstine Regal, MD, [facility]       Office: [phone]    Chief Complaint: Small bowel obstruction, ventral incisional hernia  Subjective: Patient in ICU, awake, responsive, tired.  Family at bedside.  Objective: Vital signs in last 24 hours: Temp:  [98.5 F (36.9 C)-99.9 F (37.7 C)] 98.5 F (36.9 C) (05/18 0552) Pulse Rate:  [84-107] 85 (05/18 0400) Resp:  [21-32] 23 (05/18 0400) BP: (125-133)/(47-48) 125/47 (05/18 0400) SpO2:  [91 %-94 %] 92 % (05/18 0400) Arterial Line BP: (119-143)/(44-61) 120/61 (05/18 0100) Last BM Date: 11/11/16  Intake/Output from previous day: 05/17 0701 - 05/18 0700 In: 2385 [I.V.:1725; IV Piggyback:660] Out: 2350 [Urine:1300; Emesis/NG output:400; Drains:650] Intake/Output this shift: No intake/output data recorded.  Physical Exam: HEENT - sclerae clear, mucous membranes moist Neck - soft Chest - clear bilaterally Cor - RRR Abdomen - VAC intact in midline with serous drainage Neuro - alert & oriented, no focal deficits  Lab Results:   Recent Labs  11/19/16 0405 11/20/16 0408  WBC 17.8* 14.6*  HGB 9.3* 8.8*  HCT 28.0* 27.0*  PLT 281 268   BMET  Recent Labs  11/19/16 0405 11/20/16 0408  NA 138 140  K 4.4 3.8  CL 108 110  CO2 24 24  GLUCOSE 162* 146*  BUN 41* 21*  CREATININE 1.24* 0.70  CALCIUM 7.5* 7.6*   PT/INR No results for input(s): LABPROT, INR in the last 72 hours. Comprehensive Metabolic Panel:    Component Value Date/Time   NA 140 11/20/2016 0408   NA 138 11/19/2016 0405   NA 141 01/13/2016 1354   NA 140 07/09/2015 1549   K 3.8 11/20/2016 0408   K 4.4 11/19/2016 0405   K 4.3  01/13/2016 1354   K 3.9 07/09/2015 1549   CL 110 11/20/2016 0408   CL 108 11/19/2016 0405   CL 108 (H) 12/28/2012 1343   CL 103 12/07/2012 1333   CO2 24 11/20/2016 0408   CO2 24 11/19/2016 0405   CO2 22 01/13/2016 1354   CO2 24 07/09/2015 1549   BUN 21 (H) 11/20/2016 0408   BUN 41 (H) 11/19/2016 0405   BUN 21.6 01/13/2016 1354   BUN 19.8 07/09/2015 1549   CREATININE 0.70 11/20/2016 0408   CREATININE 1.24 (H) 11/19/2016 0405   CREATININE 1.0 01/13/2016 1354   CREATININE 0.8 07/09/2015 1549   GLUCOSE 146 (H) 11/20/2016 0408   GLUCOSE 162 (H) 11/19/2016 0405   GLUCOSE 135 01/13/2016 1354   GLUCOSE 133 07/09/2015 1549   GLUCOSE 99 12/28/2012 1343   GLUCOSE 105 (H) 12/07/2012 1333   CALCIUM 7.6 (L) 11/20/2016 0408   CALCIUM 7.5 (L) 11/19/2016 0405   CALCIUM 9.6 01/13/2016 1354   CALCIUM 9.4 07/09/2015 1549   AST 27 11/15/2016 1850   AST 15 01/13/2016 1354   AST 16 07/09/2015 1549   ALT 26 11/15/2016 1850   ALT 11 01/13/2016 1354   ALT 13 07/09/2015 1549   ALKPHOS 64 11/15/2016 1850   ALKPHOS 75 01/13/2016 1354   ALKPHOS 68 07/09/2015 1549   BILITOT 0.8 11/15/2016 1850   BILITOT <0.30 01/13/2016  1354   BILITOT <0.30 07/09/2015 1549   PROT 7.8 11/15/2016 1850   PROT 6.9 01/13/2016 1354   PROT 7.0 07/09/2015 1549   ALBUMIN 3.9 11/15/2016 1850   ALBUMIN 3.5 01/13/2016 1354   ALBUMIN 3.4 (L) 07/09/2015 1549    Studies/Results: Dg Chest Port 1 View  Result Date: 11/19/2016 CLINICAL DATA:  Respiratory failure. EXAM: PORTABLE CHEST 1 VIEW COMPARISON:  11/17/2016. FINDINGS: Interim extubation. Right IJ line NG tube in stable position. Heart size normal. Low lung volumes with mild basilar atelectasis. No focal infiltrate. No pleural effusion or pneumothorax. Surgical clips are noted over the chest. Left shoulder replacement. IMPRESSION: 1. Interim extubation.  Right IJ line NG tube in stable position. 2. Low lung volumes with mild basilar atelectasis. Electronically Signed   By:  Marcello Moores  Register   On: 11/19/2016 06:55      Brandy [name] 11/20/2016  Patient ID: Brandy [name], female   DOB: Nov 16, 1932, 83 y.o.   MRN: [id number]

## 2016-11-20 NOTE — Progress Notes (Signed)
PULMONARY / CRITICAL CARE MEDICINE   Name: Brandy [name] MRN: [id number] DOB: 08-17-1932    ADMISSION DATE:  11/15/2016 CONSULTATION DATE:  11/17/16  REFERRING MD:  Harlow Asa  CHIEF COMPLAINT:  Post op vent management  HISTORY OF PRESENT ILLNESS:   Brandy [name] is a 83 y.o. female  presented to [facility] 5/13 with small bowel obstruction due to incarcerated bowel related to a chronic ventral hernia  She was taken to the OR 5/15 for ex lap with lysis of adhesions, small bowel resection, and repair of incarcerated hernia with insertion of biologic mesh patch.  She had wound vac placed and returned to the ICU on the ventilator.   SUBJECTIVE: c/o feeling weak Some abdominal pain UO improved afebrile  VITAL SIGNS: BP (!) 125/47 (BP Location: Right Arm)   Pulse 85   Temp 99.1 F (37.3 C) (Oral)   Resp (!) 23   Ht 4\' 11"  (1.499 m)   Wt 160 lb 15 oz (73 kg)   SpO2 92%   BMI 32.51 kg/m   HEMODYNAMICS:    VENTILATOR SETTINGS:    INTAKE / OUTPUT: I/O last 3 completed shifts: In: 3335 [I.V.:2625; IV Piggyback:710] Out: 3275 [Urine:1675; Emesis/NG output:550; Drains:1050]   PHYSICAL EXAMINATION: General: Elderly female,appears acutely ill Neuro:  interactive, non focal HEENT: [place]/AT. PERRL, sclerae anicteric. Cardiovascular: RRR, 3/6 ESM at base Lungs: Respirations even and unlabored.  CTA bilaterally, No W/R/R.  Abdomen: Abdominal incision  with wound vac, mild tenderness Musculoskeletal: No gross deformities, no edema.  Skin: Intact, warm, no rashes.  LABS:  BMET  Recent Labs Lab 11/18/16 0411 11/19/16 0405 11/20/16 0408  NA 138 138 140  K 4.7 4.4 3.8  CL 108 108 110  CO2 22 24 24   BUN 37* 41* 21*  CREATININE 1.11* 1.24* 0.70  GLUCOSE 205* 162* 146*    Electrolytes  Recent Labs Lab 11/18/16 0411 11/19/16 0405 11/20/16 0408  CALCIUM 7.9* 7.5* 7.6*  MG 1.8 1.9 2.0  PHOS 3.8 2.0* 1.8*    CBC  Recent Labs Lab 11/18/16 0411 11/19/16 0405  11/20/16 0408  WBC 20.0* 17.8* 14.6*  HGB 10.7* 9.3* 8.8*  HCT 32.3* 28.0* 27.0*  PLT 288 281 268    Coag's No results for input(s): APTT, INR in the last 168 hours.  Sepsis Markers  Recent Labs Lab 11/15/16 2119  LATICACIDVEN 1.72    ABG  Recent Labs Lab 11/17/16 1950  PHART 7.293*  PCO2ART 44.2  PO2ART 142*    Liver Enzymes  Recent Labs Lab 11/15/16 1850  AST 27  ALT 26  ALKPHOS 64  BILITOT 0.8  ALBUMIN 3.9    Cardiac Enzymes No results for input(s): TROPONINI, PROBNP in the last 168 hours.  Glucose No results for input(s): GLUCAP in the last 168 hours.  Imaging CXR reviewed 5/17   STUDIES:  CT A / P 5/15 > 3 ventral / periumbilical hernias containing small bowel, incarceration is not entirely excluded, high grade SBO. Nuc Med 5/15 > no ischemia, LVEF 71%.  CULTURES: None.  ANTIBIOTICS: Ancef 5/15 x 1 Ertapenem 5/15 >   SIGNIFICANT EVENTS: 5/13 > admit. 5/15 > to OR  LINES/TUBES: ETT 5/15 > 5/16 R IJ CVL 5/15 >   DISCUSSION: 83 y.o. female admitted 5/13 with SBO and incarcerated hernia, taken to OR 5/15 for ex-lap with lysis of adhesions, small bowel resection, and repair of incarcerated hernia with insertion of biologic mesh patch.  Has done well p-extubation, but bowel function slow to  return  ASSESSMENT / PLAN:  GASTROINTESTINAL A:   SBO with incarcerated hernia - s/p ex lap with lysis of adhesions, small bowel resection, and repair of incarcerated hernia with insertion of biologic mesh patch 5/15 . GERD. Nutrition. Hx dysphagia. P:   Post op care, wound vac, npo  per [facility]. SUP: Pantoprazole.   PULMONARY A: Post op resp failure -resolved Hx asthma. P:   Incentive spirometry VAP prevention measures. Oob, mobilise   CARDIOVASCULAR A:  Hx HTN, HLD, AS. DNR Status. Mod AS P:  Monitor hemodynamics. Hold preadmission amlodipine, spironolactone, diovan. Can use hydrallazine prn  RENAL A:   AKI -  resolved Hypophos P:   replete electrolytes as indicated. BMP daily Ct D51/2 NS   HEMATOLOGIC / ONCOLOGIC A:   VTE Prophylaxis. Hx radiation therapy to left breast. P:  SCD's. CBC in AM.  INFECTIOUS A:   Peritonitis. P:   Ct ertapenem x 7 ds.   ENDOCRINE A:   Hyperglycemia - no hx DM. P:   SSI if glucose consistently > 180.  NEUROLOGIC A:   Acute encephalopathy - due to sedation. Hx depression. P:   Hold preadmission citalopram, gabapentin.  Family updated: son in Warehouse manager at bedside  Interdisciplinary Family Meeting v Happy:  done  To [facility] 5/19, SDu status  Kara Mead MD. [facility]. [place] [facility] Pager [phone] If no response call [phone]    11/20/2016, 9:34 AM

## 2016-11-21 DIAGNOSIS — K430 Incisional hernia with obstruction, without gangrene: Secondary | ICD-10-CM | POA: Diagnosis not present

## 2016-11-21 DIAGNOSIS — N179 Acute kidney failure, unspecified: Secondary | ICD-10-CM | POA: Diagnosis not present

## 2016-11-21 LAB — CBC WITH DIFFERENTIAL/PLATELET
Basophils Absolute: 0 10*3/uL (ref 0.0–0.1)
Basophils Relative: 0 %
EOS ABS: 0.2 10*3/uL (ref 0.0–0.7)
EOS PCT: 2 %
HCT: 26.2 % — ABNORMAL LOW (ref 36.0–46.0)
HEMOGLOBIN: 8.4 g/dL — AB (ref 12.0–15.0)
Lymphocytes Relative: 18 %
Lymphs Abs: 1.9 10*3/uL (ref 0.7–4.0)
MCH: 28.6 pg (ref 26.0–34.0)
MCHC: 32.1 g/dL (ref 30.0–36.0)
MCV: 89.1 fL (ref 78.0–100.0)
MONOS PCT: 9 %
Monocytes Absolute: 1 10*3/uL (ref 0.1–1.0)
Neutro Abs: 7.4 10*3/uL (ref 1.7–7.7)
Neutrophils Relative %: 71 %
PLATELETS: 251 10*3/uL (ref 150–400)
RBC: 2.94 MIL/uL — ABNORMAL LOW (ref 3.87–5.11)
RDW: 16.9 % — ABNORMAL HIGH (ref 11.5–15.5)
WBC: 10.5 10*3/uL (ref 4.0–10.5)

## 2016-11-21 LAB — BASIC METABOLIC PANEL
Anion gap: 6 (ref 5–15)
BUN: 13 mg/dL (ref 6–20)
CHLORIDE: 109 mmol/L (ref 101–111)
CO2: 25 mmol/L (ref 22–32)
CREATININE: 0.6 mg/dL (ref 0.44–1.00)
Calcium: 7.6 mg/dL — ABNORMAL LOW (ref 8.9–10.3)
GFR calc Af Amer: >60 mL/min (ref >60)
GFR calc non Af Amer: >60 mL/min (ref >60)
GLUCOSE: 117 mg/dL — AB (ref 65–99)
Potassium: 3.9 mmol/L (ref 3.5–5.1)
SODIUM: 140 mmol/L (ref 135–145)

## 2016-11-21 LAB — MAGNESIUM: MAGNESIUM: 2 mg/dL (ref 1.7–2.4)

## 2016-11-21 LAB — PHOSPHORUS: Phosphorus: 2.2 mg/dL — ABNORMAL LOW (ref 2.5–4.6)

## 2016-11-21 MED ORDER — SODIUM CHLORIDE 0.9% FLUSH
10.0000 mL | INTRAVENOUS | Status: DC | PRN
  Administered 2016-11-24: 20 mL
  Filled 2016-11-21: qty 40

## 2016-11-21 NOTE — Progress Notes (Signed)
4 Days Post-Op   Subjective/Chief Complaint: No complaints. Seems to know what is happening   Objective: Vital signs in last 24 hours: Temp:  [98.2 F (36.8 C)-99.1 F (37.3 C)] 98.2 F (36.8 C) (05/19 0005) Pulse Rate:  [72-86] 74 (05/19 0600) Resp:  [20-29] 25 (05/19 0600) BP: (117-154)/(42-68) 136/46 (05/19 0600) SpO2:  [92 %-96 %] 95 % (05/19 0600) Last BM Date: 11/11/16  Intake/Output from previous day: 05/18 0701 - 05/19 0700 In: 1475.4 [I.V.:1375.4; IV Piggyback:100] Out: 9509 [Urine:1135; Emesis/NG output:200; Drains:150] Intake/Output this shift: Total I/O In: 700 [I.V.:600; IV Piggyback:100] Out: 675 [Urine:500; Emesis/NG output:100; Drains:75]  General appearance: alert and cooperative Resp: clear to auscultation bilaterally Cardio: regular rate and rhythm and systolic murmur GI: soft, mild tenderness. vac in place  Lab Results:   Recent Labs  11/20/16 0408 11/21/16 0400  WBC 14.6* 10.5  HGB 8.8* 8.4*  HCT 27.0* 26.2*  PLT 268 251   BMET  Recent Labs  11/20/16 0408 11/21/16 0400  NA 140 140  K 3.8 3.9  CL 110 109  CO2 24 25  GLUCOSE 146* 117*  BUN 21* 13  CREATININE 0.70 0.60  CALCIUM 7.6* 7.6*   PT/INR No results for input(s): LABPROT, INR in the last 72 hours. ABG No results for input(s): PHART, HCO3 in the last 72 hours.  Invalid input(s): PCO2, PO2  Studies/Results: No results found.  Anti-infectives: Anti-infectives    Start     Dose/Rate Route Frequency Ordered Stop   11/18/16 0600  ceFAZolin (ANCEF) IVPB 2g/100 mL premix     2 g 200 mL/hr over 30 Minutes Intravenous On call to O.R. 11/17/16 1301 11/17/16 1458   11/17/16 2000  ertapenem (INVANZ) 1 g in sodium chloride 0.9 % 50 mL IVPB     1 g 100 mL/hr over 30 Minutes Intravenous Every 24 hours 11/17/16 1910     11/17/16 1410  ceFAZolin (ANCEF) 2-4 GM/100ML-% IVPB    Comments:  [place]   : cabinet override      11/17/16 1410 11/18/16 0214       Assessment/Plan: s/p Procedure(s): EXPLORATORY LAPAROTOMY WITH LYSIS OF ADHESIONS, SMALL BOWEL RESECTION, REPAIR OF INCARCERATED VENTRAL INCISIONAL HERNIA, INSERTION OF BIOLOGIC MESH PATCH,APPLICATION OF WOUND VAC DRESSING (N/A) Continue ng and bowel rest  Continue vac for abdominal wound Continue Invanz for postop coverage of incarcerated hernia Hemodynamically stable. Ok to transfer to floor if ok with Medicine  LOS: 6 days    TOTH III,Makaylen Thieme S 11/21/2016

## 2016-11-21 NOTE — Progress Notes (Signed)
Clarified with Dr. Jerilee Hoh via phone pt doesn't need cardiac monitoring. Order dc'd.

## 2016-11-21 NOTE — Progress Notes (Signed)
PROGRESS NOTE    ABBAGAIL [name]  DEY:[id number] DOB: 05-Jan-1933 DOA: 11/15/2016 PCP: Kathyrn Lass, MD     Brief Narrative:  83 y/o woman admitted to the hospital from home on 5/13 due to abd pain, n/v found to have an incarcerated hernia. Taken to the OR on 5/15 for repair. Has now been extubated and doing well. NG remains to suction. Remains NPO.   Assessment & Plan:   Principal Problem:   Incarcerated incisional hernia s/p SB resection & repair 11/17/2016 Active Problems:   Depression   Gastroesophageal reflux disease without esophagitis   Hyperlipidemia   Essential hypertension   Protein-calorie malnutrition, severe (HCC)   Acute renal failure (ARF) (HCC)   Hyperglycemia   Pre-operative cardiovascular examination   S/P exploratory laparotomy   Small bowel obstruction (HCC)   Incarcerated Incisional Hernia -S/p repair on 5/15. -Surgery recommends continuing NG suction and bowel rest. -Remains on invanz per surgical recommendations. Would recommend 7 days total. -Has a wound vac. -I believe she is stable for transfer to medical floor today.  HTN -Fair control. -Resume home meds slowly once able to take POs.  ARF -Resolved.  Acute Metabolic Encephalopathy -Improved, altho still drowsy. -Suspect related to sedating meds and anesthetics received for surgery.     DVT prophylaxis: SCDs Code Status: DNR Family Communication: patient only Disposition Plan: Transfer to floor; start mobilizing. Advance diet per surgery's discretion.  Consultants:   Surgery  CCM  Procedures:   Incarcerated hernia repair  Antimicrobials:  Anti-infectives    Start     Dose/Rate Route Frequency Ordered Stop   11/18/16 0600  ceFAZolin (ANCEF) IVPB 2g/100 mL premix     2 g 200 mL/hr over 30 Minutes Intravenous On call to O.R. 11/17/16 1301 11/17/16 1458   11/17/16 2000  ertapenem (INVANZ) 1 g in sodium chloride 0.9 % 50 mL IVPB     1 g 100 mL/hr over 30 Minutes Intravenous  Every 24 hours 11/17/16 1910     11/17/16 1410  ceFAZolin (ANCEF) 2-4 GM/100ML-% IVPB    Comments:  Brandy [name]   : cabinet override      11/17/16 1410 11/18/16 0214       Subjective: Sleepy, opens eyes to voice and can answer simple questions. States she still has some incisional abdominal pain.  Objective: Vitals:   11/21/16 0400 11/21/16 0600 11/21/16 0631 11/21/16 0800  BP: (!) 122/42 (!) 136/46  (!) 161/58  Pulse: 77 74  71  Resp: (!) 24 (!) 25  20  Temp:   98.1 F (36.7 C)   TempSrc:   Oral   SpO2: 94% 95%  96%  Weight:      Height:        Intake/Output Summary (Last 24 hours) at 11/21/16 0926 Last data filed at 11/21/16 0600  Gross per 24 hour  Intake          1250.42 ml  Output             1335 ml  Net           -84.58 ml   Filed Weights   11/16/16 0019  Weight: 73 kg (160 lb 15 oz)    Examination:  General exam: Alert, awake, oriented x 3, altho drowsy and falls asleep mid-conversation. Respiratory system: Clear to auscultation. Respiratory effort normal. Cardiovascular system:RRR. No murmurs, rubs, gallops. Gastrointestinal system: soft, mild tenderness to palpation, VAC in place Central nervous system: Alert and oriented. No focal neurological deficits. Extremities:  No C/C/E, +pedal pulses Skin: No rashes, lesions or ulcers Psychiatry: Judgement and insight appear normal. Mood & affect appropriate.     Data Reviewed: I have personally reviewed following labs and imaging studies  CBC:  Recent Labs Lab 11/17/16 0418 11/18/16 0411 11/19/16 0405 11/20/16 0408 11/21/16 0400  WBC 10.1 20.0* 17.8* 14.6* 10.5  NEUTROABS  --  17.9* 13.4* 11.5* 7.4  HGB 10.1* 10.7* 9.3* 8.8* 8.4*  HCT 31.1* 32.3* 28.0* 27.0* 26.2*  MCV 88.9 88.7 88.9 88.8 89.1  PLT 239 288 281 268 741   Basic Metabolic Panel:  Recent Labs Lab 11/17/16 0418 11/18/16 0411 11/19/16 0405 11/20/16 0408 11/21/16 0400  NA 140 138 138 140 140  K 4.0 4.7 4.4 3.8 3.9  CL 109 108  108 110 109  CO2 23 22 24 24 25   GLUCOSE 77 205* 162* 146* 117*  BUN 44* 37* 41* 21* 13  CREATININE 1.03* 1.11* 1.24* 0.70 0.60  CALCIUM 8.3* 7.9* 7.5* 7.6* 7.6*  MG  --  1.8 1.9 2.0 2.0  PHOS  --  3.8 2.0* 1.8* 2.2*   GFR: Estimated Creatinine Clearance: 48 mL/min (by C-G formula based on SCr of 0.6 mg/dL). Liver Function Tests:  Recent Labs Lab 11/15/16 1850  AST 27  ALT 26  ALKPHOS 64  BILITOT 0.8  PROT 7.8  ALBUMIN 3.9    Recent Labs Lab 11/15/16 1850  LIPASE 24   No results for input(s): AMMONIA in the last 168 hours. Coagulation Profile: No results for input(s): INR, PROTIME in the last 168 hours. Cardiac Enzymes: No results for input(s): CKTOTAL, CKMB, CKMBINDEX, TROPONINI in the last 168 hours. BNP (last 3 results) No results for input(s): PROBNP in the last 8760 hours. HbA1C: No results for input(s): HGBA1C in the last 72 hours. CBG: No results for input(s): GLUCAP in the last 168 hours. Lipid Profile: No results for input(s): CHOL, HDL, LDLCALC, TRIG, CHOLHDL, LDLDIRECT in the last 72 hours. Thyroid Function Tests: No results for input(s): TSH, T4TOTAL, FREET4, T3FREE, THYROIDAB in the last 72 hours. Anemia Panel: No results for input(s): VITAMINB12, FOLATE, FERRITIN, TIBC, IRON, RETICCTPCT in the last 72 hours. Urine analysis:    Component Value Date/Time   COLORURINE AMBER (A) 11/15/2016 2253   APPEARANCEUR CLOUDY (A) 11/15/2016 2253   LABSPEC 1.017 11/15/2016 2253   PHURINE 5.0 11/15/2016 2253   GLUCOSEU NEGATIVE 11/15/2016 2253   HGBUR NEGATIVE 11/15/2016 2253   BILIRUBINUR SMALL (A) 11/15/2016 2253   KETONESUR NEGATIVE 11/15/2016 2253   PROTEINUR NEGATIVE 11/15/2016 2253   UROBILINOGEN 1.0 10/10/2009 1100   NITRITE NEGATIVE 11/15/2016 2253   LEUKOCYTESUR NEGATIVE 11/15/2016 2253   Sepsis Labs: @LABRCNTIP (procalcitonin:4,lacticidven:4)  ) Recent Results (from the past 240 hour(s))  MRSA PCR Screening     Status: None   Collection Time:  11/17/16  1:41 PM  Result Value Ref Range Status   MRSA by PCR NEGATIVE NEGATIVE Final    Comment:        The GeneXpert MRSA Assay (FDA approved for NASAL specimens only), is one component of a comprehensive MRSA colonization surveillance program. It is not intended to diagnose MRSA infection nor to guide or monitor treatment for MRSA infections.          Radiology Studies: No results found.      Scheduled Meds: . chlorhexidine  15 mL Mouth Rinse BID  . Chlorhexidine Gluconate Cloth  6 each Topical Q0600  . mouth rinse  15 mL Mouth Rinse q12n4p  Continuous Infusions: . dextrose 5 % and 0.45% NaCl 50 mL/hr at 11/21/16 0600  . ertapenem [facility]) IV Stopped (11/20/16 2014)  . famotidine (PEPCID) IV Stopped (11/20/16 2302)     LOS: 6 days    Time spent: 35 minutes. Greater than 50% of this time was spent in direct contact with the patient coordinating care.     Lelon Frohlich, MD [facility] Pager [phone]  If 7PM-7AM, please contact night-coverage [email or web address] Password [email or web address] 11/21/2016, 9:26 AM

## 2016-11-22 ENCOUNTER — Inpatient Hospital Stay (HOSPITAL_COMMUNITY): Payer: Medicare HMO

## 2016-11-22 DIAGNOSIS — K430 Incisional hernia with obstruction, without gangrene: Secondary | ICD-10-CM | POA: Diagnosis not present

## 2016-11-22 DIAGNOSIS — Z4682 Encounter for fitting and adjustment of non-vascular catheter: Secondary | ICD-10-CM | POA: Diagnosis not present

## 2016-11-22 DIAGNOSIS — N179 Acute kidney failure, unspecified: Secondary | ICD-10-CM | POA: Diagnosis not present

## 2016-11-22 LAB — CBC WITH DIFFERENTIAL/PLATELET
BASOS ABS: 0 10*3/uL (ref 0.0–0.1)
Basophils Relative: 0 %
EOS PCT: 3 %
Eosinophils Absolute: 0.3 10*3/uL (ref 0.0–0.7)
HEMATOCRIT: 25.7 % — AB (ref 36.0–46.0)
Hemoglobin: 8.4 g/dL — ABNORMAL LOW (ref 12.0–15.0)
LYMPHS ABS: 1.6 10*3/uL (ref 0.7–4.0)
LYMPHS PCT: 17 %
MCH: 29 pg (ref 26.0–34.0)
MCHC: 32.7 g/dL (ref 30.0–36.0)
MCV: 88.6 fL (ref 78.0–100.0)
MONOS PCT: 11 %
Monocytes Absolute: 1 10*3/uL (ref 0.1–1.0)
Neutro Abs: 6.6 10*3/uL (ref 1.7–7.7)
Neutrophils Relative %: 69 %
Platelets: 256 10*3/uL (ref 150–400)
RBC: 2.9 MIL/uL — ABNORMAL LOW (ref 3.87–5.11)
RDW: 16.5 % — AB (ref 11.5–15.5)
WBC: 9.5 10*3/uL (ref 4.0–10.5)

## 2016-11-22 LAB — BASIC METABOLIC PANEL
Anion gap: 7 (ref 5–15)
BUN: 10 mg/dL (ref 6–20)
CALCIUM: 7.7 mg/dL — AB (ref 8.9–10.3)
CO2: 25 mmol/L (ref 22–32)
Chloride: 105 mmol/L (ref 101–111)
Creatinine, Ser: 0.55 mg/dL (ref 0.44–1.00)
GFR calc Af Amer: >60 mL/min (ref >60)
GFR calc non Af Amer: >60 mL/min (ref >60)
GLUCOSE: 120 mg/dL — AB (ref 65–99)
Potassium: 3.6 mmol/L (ref 3.5–5.1)
Sodium: 137 mmol/L (ref 135–145)

## 2016-11-22 LAB — MAGNESIUM: Magnesium: 1.9 mg/dL (ref 1.7–2.4)

## 2016-11-22 LAB — PHOSPHORUS: Phosphorus: 2 mg/dL — ABNORMAL LOW (ref 2.5–4.6)

## 2016-11-22 MED ORDER — ENOXAPARIN SODIUM 40 MG/0.4ML ~~LOC~~ SOLN
40.0000 mg | SUBCUTANEOUS | Status: DC
  Administered 2016-11-22 – 2016-11-25 (×4): 40 mg via SUBCUTANEOUS
  Filled 2016-11-22 (×4): qty 0.4

## 2016-11-22 NOTE — Progress Notes (Signed)
5 Days Post-Op   Subjective/Chief Complaint: oob yesterday, no real bowel function yet, pain controlled, sleepy but responsive and appropriate   Objective: Vital signs in last 24 hours: Temp:  [98 F (36.7 C)-99.1 F (37.3 C)] 98 F (36.7 C) (05/20 0522) Pulse Rate:  [77-87] 77 (05/20 0522) Resp:  [18-24] 18 (05/20 0522) BP: (139-161)/(50-71) 153/56 (05/20 0522) SpO2:  [95 %-97 %] 95 % (05/20 0522) Last BM Date: 11/11/16  Intake/Output from previous day: 05/19 0701 - 05/20 0700 In: 1250 [I.V.:1150; IV Piggyback:100] Out: 1870 [Urine:1350; Emesis/NG output:420; Drains:100] Intake/Output this shift: No intake/output data recorded.  GI: soft mild tender few bs present vac in place  Lab Results:   Recent Labs  11/21/16 0400 11/22/16 0426  WBC 10.5 9.5  HGB 8.4* 8.4*  HCT 26.2* 25.7*  PLT 251 256   BMET  Recent Labs  11/21/16 0400 11/22/16 0426  NA 140 137  K 3.9 3.6  CL 109 105  CO2 25 25  GLUCOSE 117* 120*  BUN 13 10  CREATININE 0.60 0.55  CALCIUM 7.6* 7.7*   PT/INR No results for input(s): LABPROT, INR in the last 72 hours. ABG No results for input(s): PHART, HCO3 in the last 72 hours.  Invalid input(s): PCO2, PO2  Studies/Results: Dg Abd Portable 1v  Result Date: 11/22/2016 CLINICAL DATA:  NG tube placement EXAM: PORTABLE ABDOMEN - 1 VIEW COMPARISON:  11/17/2016 FINDINGS: NG tube tip is in the proximal stomach with the side port in the distal esophagus. Nonobstructive bowel gas pattern. IMPRESSION: NG tube tip in the proximal stomach with the side port in the distal esophagus. Electronically Signed   By: Rolm Baptise M.D.   On: 11/22/2016 08:01    Anti-infectives: Anti-infectives    Start     Dose/Rate Route Frequency Ordered Stop   11/18/16 0600  ceFAZolin (ANCEF) IVPB 2g/100 mL premix     2 g 200 mL/hr over 30 Minutes Intravenous On call to O.R. 11/17/16 1301 11/17/16 1458   11/17/16 2000  ertapenem (INVANZ) 1 g in sodium chloride 0.9 % 50 mL  IVPB     1 g 100 mL/hr over 30 Minutes Intravenous Every 24 hours 11/17/16 1910     11/17/16 1410  ceFAZolin (ANCEF) 2-4 GM/100ML-% IVPB    Comments:  Virgia Land   : cabinet override      11/17/16 1410 11/18/16 0214      Assessment/Plan: POD 5 elap, sbr, primary vh repair with biologic  1. Iv pain meds until taking po 2. pulm toilet 3. Will clamp ng as she does have bowel sounds today and possibly remove later, output only 420 all day yesterday, will hold on advancing diet though 4. Vac change tomorrow 5. Will put on lovenox today, not sure why is she not on it 6. abx can be stopped after five postop days if ok with medicine, does not need longer course, she is afebrile and wbc normal  [facility] 11/22/2016

## 2016-11-22 NOTE — Progress Notes (Signed)
PROGRESS NOTE    Brandy [name]  UJW:[id number] DOB: Oct 03, 1932 DOA: 11/15/2016 PCP: Kathyrn Lass, MD     Brief Narrative:  83 y/o woman admitted to the hospital from home on 5/13 due to abd pain, n/v found to have an incarcerated hernia. Taken to the OR on 5/15 for repair. Has now been extubated and doing well. Remains NPO. Plan to clamp NG and allow for diet next 24 hours. Still has not had return of bowel function.   Assessment & Plan:   Principal Problem:   Incarcerated incisional hernia s/p SB resection & repair 11/17/2016 Active Problems:   Depression   Gastroesophageal reflux disease without esophagitis   Hyperlipidemia   Essential hypertension   Protein-calorie malnutrition, severe (HCC)   Acute renal failure (ARF) (HCC)   Hyperglycemia   Pre-operative cardiovascular examination   S/P exploratory laparotomy   Small bowel obstruction (HCC)   Incarcerated Incisional Hernia -S/p repair on 5/15. -Surgery following: recommend clamping NG today and possibly removing later. -DC invanz (has completed 6 days of treatment and remains non-toxic). -Has a wound vac: management as per surgery.  HTN -Fair control. -Resume home meds slowly once able to take POs.  ARF -Resolved.  Acute Metabolic Encephalopathy -Improved, altho still drowsy. Per SIL at bedside, this is close to her baseline. -Suspect related to sedating meds and anesthetics received for surgery plus clinical illness.     DVT prophylaxis: SCDs Code Status: DNR Family Communication: SIL at bedside Disposition Plan: Transfer to floor; start mobilizing. Advance diet per surgery's discretion.  Consultants:   Surgery  CCM  Procedures:   Incarcerated hernia repair  Antimicrobials:  Anti-infectives    Start     Dose/Rate Route Frequency Ordered Stop   11/18/16 0600  ceFAZolin (ANCEF) IVPB 2g/100 mL premix     2 g 200 mL/hr over 30 Minutes Intravenous On call to O.R. 11/17/16 1301 11/17/16 1458   11/17/16 2000  ertapenem (INVANZ) 1 g in sodium chloride 0.9 % 50 mL IVPB  Status:  Discontinued     1 g 100 mL/hr over 30 Minutes Intravenous Every 24 hours 11/17/16 1910 11/22/16 0900   11/17/16 1410  ceFAZolin (ANCEF) 2-4 GM/100ML-% IVPB    Comments:  Virgia Land   : cabinet override      11/17/16 1410 11/18/16 0214       Subjective: Drowsy but awake and able to answer questions appropriately. Does not c/o abdominal pain today.  Objective: Vitals:   11/21/16 1439 11/21/16 1756 11/21/16 2215 11/22/16 0522  BP: (!) 146/64 (!) 151/62 (!) 151/71 (!) 153/56  Pulse: 78 80 84 77  Resp: 18 18 18 18   Temp: 98.6 F (37 C) 99.1 F (37.3 C) 99.1 F (37.3 C) 98 F (36.7 C)  TempSrc: Oral Oral Oral Oral  SpO2: 95% 95% 95% 95%  Weight:      Height:        Intake/Output Summary (Last 24 hours) at 11/22/16 0930 Last data filed at 11/22/16 0600  Gross per 24 hour  Intake             1250 ml  Output             1800 ml  Net             -550 ml   Filed Weights   11/16/16 0019  Weight: 73 kg (160 lb 15 oz)    Examination:  General exam: Alert, awake, oriented x 3 altho drowsy Respiratory system:  Clear to auscultation. Respiratory effort normal. Cardiovascular system:RRR. No murmurs, rubs, gallops. Gastrointestinal system: Abdomen is slightly TTP. Wound VAC in place. BS present, tho hypoactive. Central nervous system: Alert and oriented. No focal neurological deficits. Extremities: No C/C/E, +pedal pulses Psychiatry: Judgement and insight appear normal. Mood & affect appropriate.     Data Reviewed: I have personally reviewed following labs and imaging studies  CBC:  Recent Labs Lab 11/18/16 0411 11/19/16 0405 11/20/16 0408 11/21/16 0400 11/22/16 0426  WBC 20.0* 17.8* 14.6* 10.5 9.5  NEUTROABS 17.9* 13.4* 11.5* 7.4 6.6  HGB 10.7* 9.3* 8.8* 8.4* 8.4*  HCT 32.3* 28.0* 27.0* 26.2* 25.7*  MCV 88.7 88.9 88.8 89.1 88.6  PLT 288 281 268 251 841   Basic Metabolic  Panel:  Recent Labs Lab 11/18/16 0411 11/19/16 0405 11/20/16 0408 11/21/16 0400 11/22/16 0426  NA 138 138 140 140 137  K 4.7 4.4 3.8 3.9 3.6  CL 108 108 110 109 105  CO2 22 24 24 25 25   GLUCOSE 205* 162* 146* 117* 120*  BUN 37* 41* 21* 13 10  CREATININE 1.11* 1.24* 0.70 0.60 0.55  CALCIUM 7.9* 7.5* 7.6* 7.6* 7.7*  MG 1.8 1.9 2.0 2.0 1.9  PHOS 3.8 2.0* 1.8* 2.2* 2.0*   GFR: Estimated Creatinine Clearance: 48 mL/min (by C-G formula based on SCr of 0.55 mg/dL). Liver Function Tests:  Recent Labs Lab 11/15/16 1850  AST 27  ALT 26  ALKPHOS 64  BILITOT 0.8  PROT 7.8  ALBUMIN 3.9    Recent Labs Lab 11/15/16 1850  LIPASE 24   No results for input(s): AMMONIA in the last 168 hours. Coagulation Profile: No results for input(s): INR, PROTIME in the last 168 hours. Cardiac Enzymes: No results for input(s): CKTOTAL, CKMB, CKMBINDEX, TROPONINI in the last 168 hours. BNP (last 3 results) No results for input(s): PROBNP in the last 8760 hours. HbA1C: No results for input(s): HGBA1C in the last 72 hours. CBG: No results for input(s): GLUCAP in the last 168 hours. Lipid Profile: No results for input(s): CHOL, HDL, LDLCALC, TRIG, CHOLHDL, LDLDIRECT in the last 72 hours. Thyroid Function Tests: No results for input(s): TSH, T4TOTAL, FREET4, T3FREE, THYROIDAB in the last 72 hours. Anemia Panel: No results for input(s): VITAMINB12, FOLATE, FERRITIN, TIBC, IRON, RETICCTPCT in the last 72 hours. Urine analysis:    Component Value Date/Time   COLORURINE AMBER (A) 11/15/2016 2253   APPEARANCEUR CLOUDY (A) 11/15/2016 2253   LABSPEC 1.017 11/15/2016 2253   PHURINE 5.0 11/15/2016 2253   GLUCOSEU NEGATIVE 11/15/2016 2253   HGBUR NEGATIVE 11/15/2016 2253   BILIRUBINUR SMALL (A) 11/15/2016 2253   KETONESUR NEGATIVE 11/15/2016 2253   PROTEINUR NEGATIVE 11/15/2016 2253   UROBILINOGEN 1.0 10/10/2009 1100   NITRITE NEGATIVE 11/15/2016 2253   LEUKOCYTESUR NEGATIVE 11/15/2016 2253    Sepsis Labs: @LABRCNTIP (procalcitonin:4,lacticidven:4)  ) Recent Results (from the past 240 hour(s))  MRSA PCR Screening     Status: None   Collection Time: 11/17/16  1:41 PM  Result Value Ref Range Status   MRSA by PCR NEGATIVE NEGATIVE Final    Comment:        The GeneXpert MRSA Assay (FDA approved for NASAL specimens only), is one component of a comprehensive MRSA colonization surveillance program. It is not intended to diagnose MRSA infection nor to guide or monitor treatment for MRSA infections.          Radiology Studies: Dg Abd Portable 1v  Result Date: 11/22/2016 CLINICAL DATA:  NG tube placement EXAM:  PORTABLE ABDOMEN - 1 VIEW COMPARISON:  11/17/2016 FINDINGS: NG tube tip is in the proximal stomach with the side port in the distal esophagus. Nonobstructive bowel gas pattern. IMPRESSION: NG tube tip in the proximal stomach with the side port in the distal esophagus. Electronically Signed   By: Rolm Baptise M.D.   On: 11/22/2016 08:01        Scheduled Meds: . chlorhexidine  15 mL Mouth Rinse BID  . Chlorhexidine Gluconate Cloth  6 each Topical Q0600  . enoxaparin (LOVENOX) injection  40 mg Subcutaneous Q24H  . mouth rinse  15 mL Mouth Rinse q12n4p   Continuous Infusions: . dextrose 5 % and 0.45% NaCl 50 mL/hr at 11/21/16 1949  . famotidine (PEPCID) IV Stopped (11/21/16 2235)     LOS: 7 days    Time spent: 25 minutes. Greater than 50% of this time was spent in direct contact with the patient coordinating care.     Lelon Frohlich, MD [facility] Pager [phone]  If 7PM-7AM, please contact night-coverage [email or web address] Password [email or web address] 11/22/2016, 9:30 AM

## 2016-11-22 NOTE — Progress Notes (Signed)
NGT to suction at 1530, after being clamped for 6 hrs per order. No c/o N/V during clamping trial. Minimal residual noted in tube over last hour, therefore discontinued NGT per MD order.

## 2016-11-23 DIAGNOSIS — K430 Incisional hernia with obstruction, without gangrene: Secondary | ICD-10-CM | POA: Diagnosis not present

## 2016-11-23 DIAGNOSIS — N179 Acute kidney failure, unspecified: Secondary | ICD-10-CM | POA: Diagnosis not present

## 2016-11-23 MED ORDER — K PHOS MONO-SOD PHOS DI & MONO 155-852-130 MG PO TABS
500.0000 mg | ORAL_TABLET | Freq: Two times a day (BID) | ORAL | Status: AC
  Administered 2016-11-23 – 2016-11-24 (×3): 500 mg via ORAL
  Filled 2016-11-23 (×3): qty 2

## 2016-11-23 NOTE — Care Management Important Message (Signed)
Important Message  Patient Details  Name: Brandy [name] MRN: [id number] Date of Birth: Feb 12, 1933   Medicare Important Message Given:  Yes    Kerin Salen 11/23/2016, 10:09 AMImportant Message  Patient Details  Name: Brandy [name] MRN: [id number] Date of Birth: 1932-08-26   Medicare Important Message Given:  Yes    Kerin Salen 11/23/2016, 10:09 AM

## 2016-11-23 NOTE — Clinical Social Work Note (Signed)
Clinical Social Work Assessment  Patient Details  Name: Brandy [name] MRN: [id number] Date of Birth: 1933-06-18  Date of referral:  11/23/16               Reason for consult:  Discharge Planning                Permission sought to share information with:  Chartered certified accountant granted to share information::  Yes, Verbal Permission Granted  Name::        Agency::     Relationship::     Contact Information:     Housing/Transportation Living arrangements for the past 2 months:  Single Family Home Source of Information:  Adult Children Patient Interpreter Needed:  None Criminal Activity/Legal Involvement Pertinent to Current Situation/Hospitalization:  No - Comment as needed Significant Relationships:  Adult Children, Spouse Lives with:  Spouse Do you feel safe going back to the place where you live?   (SNF recommended.) Need for family participation in patient care:  Yes (Comment)  Care giving concerns: Pt may need more care than is available at home following hospital d/c.   Social Worker assessment / plan:  Pt hospitalized from home on 11/15/16 with Incarcerated hernia. PT has recommended SNF at d/c. CSW met with pt at bedside to review PT recommendations. Pt requested CSW speak with her daughter, Casey Burkitt [phone], regarding dc planning. Daughter contacted and gave CSW permission to initiate SNF search. CSW will contact daughter once bed offers are available. Pt has [employer] which requires prior authorization. CSW will continue to follow to assist with d/c planning.  Employment status:  Retired Nurse, adult PT Recommendations:  [place] / Referral to community resources:  [place]  Patient/Family's Response to care:  Disposition to be determined / SNF most likely.  Patient/Family's Understanding of and Emotional Response to Diagnosis, Current Treatment, and Prognosis:  Pt is  aware PT has recommended ST Rehab at d/c. She is considering this option. Support provided. Daughter appreciates CSW assistance with dc planning.  Emotional Assessment Appearance:  Appears stated age Attitude/Demeanor/Rapport:  Other (cooperative) Affect (typically observed):  Calm, Appropriate Orientation:  Oriented to Self, Oriented to Place Alcohol / Substance use:  Not Applicable Psych involvement (Current and /or in the community):  No (Comment)  Discharge Needs  Concerns to be addressed:  Discharge Planning Concerns Readmission within the last 30 days:  No Current discharge risk:  None Barriers to Discharge:  No Barriers Identified   Brandy [name]  [phone] 11/23/2016, 2:39 PM

## 2016-11-23 NOTE — Progress Notes (Signed)
Physical Therapy Treatment Patient Details Name: Brandy [name] MRN: [id number] DOB: 14-Feb-1933 Today's Date: 11/23/2016    History of Present Illness 83 yo female admitted 5/13 with SBO, incarcerated hernia. S/P exploratory lap 11/18/16 for repair of hernia, Small Bowel resection  lysis of adhesions.    PT Comments    Pt in bed with PICC, foley cath and ABD wound VAC.  Grandson visiting and assisted by following with recliner as pt walked.  Pt required + 2 assist to transfer from supine to EOB.  Fearful she stated, "don't let me fall".  Allowed pt to sit several min to adjust.  No c/o dizziness or nausea.  + 2 side by side assisted from elevated bed to B platform EVA walker.  Pt was able to amb a total of 35 feet with EVA walker and grandson following with recliner.  Pt very weak with limited activity tolerance.   Pt will need St Rehab at SNF to regain prior level of mobility.  She lives in the [place] area and would like to be close to home/family.   Follow Up Recommendations  Home health PT;SNF;Supervision/Assistance - 24 hour (pending family decision however pt would benefit from a more aggressive Rehab at [facility])     Equipment Recommendations  Rolling walker with 5" wheels    Recommendations for Other Services       Precautions / Restrictions Precautions Precautions: Fall Precaution Comments: abdominal surgery, wound VAC Restrictions Weight Bearing Restrictions: No    Mobility  Bed Mobility Overal bed mobility: Needs Assistance Bed Mobility: Supine to Sit     Supine to sit: +2 for physical assistance;+2 for safety/equipment;Mod assist     General bed mobility comments: assist with trunk, and legs. cues to try to roll and encouerage self push off.  Once upright, pt able to static sit EOB Indep  Transfers Overall transfer level: Needs assistance Equipment used: 2 person hand held assist Transfers: Sit to/from [facility] Sit to Stand: +2 physical  assistance;+2 safety/equipment;Mod assist Stand pivot transfers: +2 physical assistance;+2 safety/equipment;Mod assist       General transfer comment: sit ti stand side by side HHA from elevated bed.  Pt stated, "Don't let me fall".  stand to sit sisde by side with tactile cueing reach back for chair and to control decend.   Ambulation/Gait Ambulation/Gait assistance: +2 physical assistance;+2 safety/equipment;Max assist Ambulation Distance (Feet): 35 Feet (18 feet then a sitting rest break plus another 17 feet) Assistive device: Bilateral platform walker Gait Pattern/deviations: Step-to pattern;Decreased step length - left;Decreased step length - right Gait velocity: decreased   General Gait Details: used B platform EVA walker for first time mobility.  pt able to support her own body weight and progress gait.  Fear was major factor.  Used + 2 assist side by side and third assist Grandson to follow with recliner.   Will use RW next visit still + 2 assist.    Stairs            Wheelchair Mobility    Modified Rankin (Stroke Patients Only)       Balance                                            Cognition Arousal/Alertness: Awake/alert Behavior During Therapy: WFL for tasks assessed/performed Overall Cognitive Status: Within Functional Limits for tasks assessed  Exercises      General Comments        Pertinent Vitals/Pain Pain Assessment: 0-10 Pain Score: 5  Pain Location: abdomen Pain Descriptors / Indicators: Tightness;Grimacing Pain Intervention(s): Monitored during session;Repositioned    Home Living                      Prior Function            PT Goals (current goals can now be found in the care plan section) Progress towards PT goals: Progressing toward goals    Frequency    Min 3X/week      PT Plan Current plan remains appropriate    Co-evaluation               AM-PAC PT "6 Clicks" Daily Activity  Outcome Measure  Difficulty turning over in bed (including adjusting bedclothes, sheets and blankets)?: A Lot Difficulty moving from lying on back to sitting on the side of the bed? : A Lot Difficulty sitting down on and standing up from a chair with arms (e.g., wheelchair, bedside commode, etc,.)?: A Lot Help needed moving to and from a bed to chair (including a wheelchair)?: A Lot Help needed walking in hospital room?: A Lot Help needed climbing 3-5 steps with a railing? : Total 6 Click Score: 11    End of Session Equipment Utilized During Treatment: Gait belt Activity Tolerance: Patient tolerated treatment well Patient left: in chair;with nursing/sitter in room;with call bell/phone within reach;with family/visitor present Nurse Communication: Mobility status       Time: 1410-1442 PT Time Calculation (min) (ACUTE ONLY): 32 min  Charges:  $Gait Training: 8-22 mins $Therapeutic Activity: 8-22 mins                    G Codes:       Rica Koyanagi  PTA [facility]  Acute  Rehab Pager      [phone]

## 2016-11-23 NOTE — Progress Notes (Signed)
PROGRESS NOTE    Brandy [name]  BWI:[id number] DOB: [birth year] DOA: 11/15/2016 PCP: Kathyrn Lass, MD     Brief Narrative:  83 y/o woman admitted to the hospital from home on 5/13 due to abd pain, n/v found to have an incarcerated hernia. Taken to the OR on 5/15 for repair. Has now been extubated and doing well. Has had return of bowel function. NG was removed last pm. Plan to start clears today.  Assessment & Plan:   Principal Problem:   Incarcerated incisional hernia s/p SB resection & repair 11/17/2016 Active Problems:   Depression   Gastroesophageal reflux disease without esophagitis   Hyperlipidemia   Essential hypertension   Protein-calorie malnutrition, severe (HCC)   Acute renal failure (ARF) (HCC)   Hyperglycemia   Pre-operative cardiovascular examination   S/P exploratory laparotomy   Small bowel obstruction (HCC)   Incarcerated Incisional Hernia -S/p repair on 5/15. -Surgery following: NG has been removed. Plan to start clears today. -Has a wound vac: management as per surgery.  HTN -Fair control. -Resume home meds slowly once able to take POs.  ARF -Resolved.  Acute Metabolic Encephalopathy -Resolved. -Suspect related to sedating meds and anesthetics received for surgery plus clinical illness.     DVT prophylaxis: SCDs Code Status: DNR Family Communication: patient only Disposition Plan: Continue diet advancement, would benefit from SNF placement due to PT and skilled wound needs.  Consultants:   Surgery  CCM  Procedures:   Incarcerated hernia repair  Antimicrobials:  Anti-infectives    Start     Dose/Rate Route Frequency Ordered Stop   11/18/16 0600  ceFAZolin (ANCEF) IVPB 2g/100 mL premix     2 g 200 mL/hr over 30 Minutes Intravenous On call to O.R. 11/17/16 1301 11/17/16 1458   11/17/16 2000  ertapenem (INVANZ) 1 g in sodium chloride 0.9 % 50 mL IVPB  Status:  Discontinued     1 g 100 mL/hr over 30 Minutes Intravenous Every 24 hours  11/17/16 1910 11/22/16 0900   11/17/16 1410  ceFAZolin (ANCEF) 2-4 GM/100ML-% IVPB    Comments:  Virgia Land   : cabinet override      11/17/16 1410 11/18/16 0214       Subjective: Sitting in chair, asks appropriate questions. Had 2 BMs overnight.   Objective: Vitals:   11/22/16 1400 11/22/16 2145 11/22/16 2302 11/23/16 0530  BP: 133/61 (!) 152/70  (!) 153/62  Pulse: 81 77  68  Resp: 18 18  17   Temp: 97.7 F (36.5 C) 100 F (37.8 C) 98.6 F (37 C) 99.6 F (37.6 C)  TempSrc: Oral Oral  Oral  SpO2: 93% 98%  96%  Weight:      Height:        Intake/Output Summary (Last 24 hours) at 11/23/16 1042 Last data filed at 11/23/16 1000  Gross per 24 hour  Intake             1310 ml  Output             2150 ml  Net             -840 ml   Filed Weights   11/16/16 0019  Weight: 73 kg (160 lb 15 oz)    Examination:  General exam: Alert, awake, oriented x 3 Respiratory system: Clear to auscultation. Respiratory effort normal. Cardiovascular system:RRR. No murmurs, rubs, gallops. Gastrointestinal system: Abdomen is nondistended, soft and slightly tender to palpation. Wound vac in place. No organomegaly or masses felt.  Normal bowel sounds heard. Central nervous system: Alert and oriented. No focal neurological deficits. Extremities: trace bilateral pitting edema Skin: No rashes, lesions or ulcers Psychiatry: Judgement and insight appear normal. Mood & affect appropriate.      Data Reviewed: I have personally reviewed following labs and imaging studies  CBC:  Recent Labs Lab 11/18/16 0411 11/19/16 0405 11/20/16 0408 11/21/16 0400 11/22/16 0426  WBC 20.0* 17.8* 14.6* 10.5 9.5  NEUTROABS 17.9* 13.4* 11.5* 7.4 6.6  HGB 10.7* 9.3* 8.8* 8.4* 8.4*  HCT 32.3* 28.0* 27.0* 26.2* 25.7*  MCV 88.7 88.9 88.8 89.1 88.6  PLT 288 281 268 251 109   Basic Metabolic Panel:  Recent Labs Lab 11/18/16 0411 11/19/16 0405 11/20/16 0408 11/21/16 0400 11/22/16 0426  NA 138 138 140  140 137  K 4.7 4.4 3.8 3.9 3.6  CL 108 108 110 109 105  CO2 22 24 24 25 25   GLUCOSE 205* 162* 146* 117* 120*  BUN 37* 41* 21* 13 10  CREATININE 1.11* 1.24* 0.70 0.60 0.55  CALCIUM 7.9* 7.5* 7.6* 7.6* 7.7*  MG 1.8 1.9 2.0 2.0 1.9  PHOS 3.8 2.0* 1.8* 2.2* 2.0*   GFR: Estimated Creatinine Clearance: 48 mL/min (by C-G formula based on SCr of 0.55 mg/dL). Liver Function Tests: No results for input(s): AST, ALT, ALKPHOS, BILITOT, PROT, ALBUMIN in the last 168 hours. No results for input(s): LIPASE, AMYLASE in the last 168 hours. No results for input(s): AMMONIA in the last 168 hours. Coagulation Profile: No results for input(s): INR, PROTIME in the last 168 hours. Cardiac Enzymes: No results for input(s): CKTOTAL, CKMB, CKMBINDEX, TROPONINI in the last 168 hours. BNP (last 3 results) No results for input(s): PROBNP in the last 8760 hours. HbA1C: No results for input(s): HGBA1C in the last 72 hours. CBG: No results for input(s): GLUCAP in the last 168 hours. Lipid Profile: No results for input(s): CHOL, HDL, LDLCALC, TRIG, CHOLHDL, LDLDIRECT in the last 72 hours. Thyroid Function Tests: No results for input(s): TSH, T4TOTAL, FREET4, T3FREE, THYROIDAB in the last 72 hours. Anemia Panel: No results for input(s): VITAMINB12, FOLATE, FERRITIN, TIBC, IRON, RETICCTPCT in the last 72 hours. Urine analysis:    Component Value Date/Time   COLORURINE AMBER (A) 11/15/2016 2253   APPEARANCEUR CLOUDY (A) 11/15/2016 2253   LABSPEC 1.017 11/15/2016 2253   PHURINE 5.0 11/15/2016 2253   GLUCOSEU NEGATIVE 11/15/2016 2253   HGBUR NEGATIVE 11/15/2016 2253   BILIRUBINUR SMALL (A) 11/15/2016 2253   KETONESUR NEGATIVE 11/15/2016 2253   PROTEINUR NEGATIVE 11/15/2016 2253   UROBILINOGEN 1.0 10/10/2009 1100   NITRITE NEGATIVE 11/15/2016 2253   LEUKOCYTESUR NEGATIVE 11/15/2016 2253   Sepsis Labs: @LABRCNTIP (procalcitonin:4,lacticidven:4)  ) Recent Results (from the past 240 hour(s))  MRSA PCR  Screening     Status: None   Collection Time: 11/17/16  1:41 PM  Result Value Ref Range Status   MRSA by PCR NEGATIVE NEGATIVE Final    Comment:        The GeneXpert MRSA Assay (FDA approved for NASAL specimens only), is one component of a comprehensive MRSA colonization surveillance program. It is not intended to diagnose MRSA infection nor to guide or monitor treatment for MRSA infections.          Radiology Studies: Dg Abd Portable 1v  Result Date: 11/22/2016 CLINICAL DATA:  NG tube placement EXAM: PORTABLE ABDOMEN - 1 VIEW COMPARISON:  11/17/2016 FINDINGS: NG tube tip is in the proximal stomach with the side port in the distal esophagus. Nonobstructive  bowel gas pattern. IMPRESSION: NG tube tip in the proximal stomach with the side port in the distal esophagus. Electronically Signed   By: Rolm Baptise M.D.   On: 11/22/2016 08:01        Scheduled Meds: . chlorhexidine  15 mL Mouth Rinse BID  . Chlorhexidine Gluconate Cloth  6 each Topical Q0600  . enoxaparin (LOVENOX) injection  40 mg Subcutaneous Q24H  . mouth rinse  15 mL Mouth Rinse q12n4p   Continuous Infusions: . dextrose 5 % and 0.45% NaCl 50 mL/hr at 11/23/16 1034  . famotidine (PEPCID) IV Stopped (11/22/16 2245)     LOS: 8 days    Time spent: 25 minutes. Greater than 50% of this time was spent in direct contact with the patient coordinating care.     Lelon Frohlich, MD [facility] Pager [phone]  If 7PM-7AM, please contact night-coverage [email or web address] Password [facility] 11/23/2016, 10:42 AM

## 2016-11-23 NOTE — Progress Notes (Signed)
Patient ID: Brandy [name], female   DOB: 1932-11-25, 83 y.o.   MRN: [id number]  [facility] Surgery Progress Note  6 Days Post-Op  Subjective: CC- ventral incisional hernia Patient resting well this morning. NG removed yesterday. Patient had 2 BM's yesterday and is passing small amount of flatus today. Denies n/v. Denies abdominal pain.  Objective: Vital signs in last 24 hours: Temp:  [97.7 F (36.5 C)-100 F (37.8 C)] 99.6 F (37.6 C) (05/21 0530) Pulse Rate:  [68-81] 68 (05/21 0530) Resp:  [17-18] 17 (05/21 0530) BP: (133-153)/(61-70) 153/62 (05/21 0530) SpO2:  [93 %-98 %] 96 % (05/21 0530) Last BM Date: 11/22/16  Intake/Output from previous day: 05/20 0701 - 05/21 0700 In: 1285 [P.O.:60; I.V.:1175; IV Piggyback:50] Out: 2450 [Urine:2350; Emesis/NG output:100] Intake/Output this shift: No intake/output data recorded.  PE: Gen:  Alert, NAD, pleasant Pulm:  CTAB, no W/R/R, effort normal Abd: Soft, ND, NT, few BS heard, midline vac in place  Lab Results:   Recent Labs  11/21/16 0400 11/22/16 0426  WBC 10.5 9.5  HGB 8.4* 8.4*  HCT 26.2* 25.7*  PLT 251 256   BMET  Recent Labs  11/21/16 0400 11/22/16 0426  NA 140 137  K 3.9 3.6  CL 109 105  CO2 25 25  GLUCOSE 117* 120*  BUN 13 10  CREATININE 0.60 0.55  CALCIUM 7.6* 7.7*   PT/INR No results for input(s): LABPROT, INR in the last 72 hours. CMP     Component Value Date/Time   NA 137 11/22/2016 0426   NA 141 01/13/2016 1354   K 3.6 11/22/2016 0426   K 4.3 01/13/2016 1354   CL 105 11/22/2016 0426   CL 108 (H) 12/28/2012 1343   CO2 25 11/22/2016 0426   CO2 22 01/13/2016 1354   GLUCOSE 120 (H) 11/22/2016 0426   GLUCOSE 135 01/13/2016 1354   GLUCOSE 99 12/28/2012 1343   BUN 10 11/22/2016 0426   BUN 21.6 01/13/2016 1354   CREATININE 0.55 11/22/2016 0426   CREATININE 1.0 01/13/2016 1354   CALCIUM 7.7 (L) 11/22/2016 0426   CALCIUM 9.6 01/13/2016 1354   PROT 7.8 11/15/2016 1850   PROT 6.9  01/13/2016 1354   ALBUMIN 3.9 11/15/2016 1850   ALBUMIN 3.5 01/13/2016 1354   AST 27 11/15/2016 1850   AST 15 01/13/2016 1354   ALT 26 11/15/2016 1850   ALT 11 01/13/2016 1354   ALKPHOS 64 11/15/2016 1850   ALKPHOS 75 01/13/2016 1354   BILITOT 0.8 11/15/2016 1850   BILITOT <0.30 01/13/2016 1354   GFRNONAA >60 11/22/2016 0426   GFRAA >60 11/22/2016 0426   Lipase     Component Value Date/Time   LIPASE 24 11/15/2016 1850       Studies/Results: Dg Abd Portable 1v  Result Date: 11/22/2016 CLINICAL DATA:  NG tube placement EXAM: PORTABLE ABDOMEN - 1 VIEW COMPARISON:  11/17/2016 FINDINGS: NG tube tip is in the proximal stomach with the side port in the distal esophagus. Nonobstructive bowel gas pattern. IMPRESSION: NG tube tip in the proximal stomach with the side port in the distal esophagus. Electronically Signed   By: Rolm Baptise M.D.   On: 11/22/2016 08:01    Anti-infectives: Anti-infectives    Start     Dose/Rate Route Frequency Ordered Stop   11/18/16 0600  ceFAZolin (ANCEF) IVPB 2g/100 mL premix     2 g 200 mL/hr over 30 Minutes Intravenous On call to O.R. 11/17/16 1301 11/17/16 1458   11/17/16 2000  ertapenem (  INVANZ) 1 g in sodium chloride 0.9 % 50 mL IVPB  Status:  Discontinued     1 g 100 mL/hr over 30 Minutes Intravenous Every 24 hours 11/17/16 1910 11/22/16 0900   11/17/16 1410  ceFAZolin (ANCEF) 2-4 GM/100ML-% IVPB    Comments:  Virgia Land   : cabinet override      11/17/16 1410 11/18/16 0214       Assessment/Plan Incarcerated ventral incisional hernia, small bowel obstruction S/p ex lap, loa, sbr, ventral incisional hernia repair with biologic mesh patch 5/16 Dr. Harlow Asa - POD 6 - BM x2 yesterday  Plan - NG is out. Give sips of clear liquids from the floor today. Continue OOB/PT. Please page [phone] during vac change today.   LOS: 8 days    Jerrye Beavers , [facility] Surgery 11/23/2016, 8:03 AM Pager: [phone] Consults:  [phone] Mon-Fri 7:00 am-4:30 pm Sat-Sun 7:00 am-11:30 am

## 2016-11-23 NOTE — Consult Note (Signed)
[place] Nurse wound consult note Reason for Consult: NPWT dressing change.  PA has already been in to assess wound. Wound type: Surgical Pressure Injury POA: No Measurement:17cm x 14cm x 2.6cm with undermining from 1-5 o'clock and from 7-11 o'clock to 2 cm (two lateral gulleys).  Central area with exposed biologic mesh measuring 11cm x 5cm.  Remainder of wound bed is red, moist with serosanguinous exudate in a moderate to large amount.  The NPWT cannister is changed today. Wound bed: As described above Drainage (amount, consistency, odor) As described above Periwound:Intact. Dressing procedure/placement/frequency: Wound is cleansed with NS, and gently patted dry.  A piece of silicone non-adherent (Mepitel) is placed over the exposed mesh.  Two pieces of white foam are cut in half and used to cover the wound bed and fill in the lateral gulleys.  One large piece of black granufoam and two smaller pieces are used to fill in the defects at 12 and 6 o'clock.  The dressing is covered in drape and attached to continuous negative pressure at 172mmHg.  An immediate seal is achieved and no surrounding skin is in contact with the foam. Patient tolerated procedure well, but is medicated so that she can rest immediately following. Next dressing change is due on Wednesday, 5/23. [place] Nurse to perform. [place] nursing team will follow, and will remain available to this patient, the nursing and medical teams.  Please re-consult if needed. Thanks, Maudie Flakes, MSN, RN, [place], Arther Abbott  Pager# [phone]

## 2016-11-23 NOTE — NC FL2 (Signed)
[place] MEDICAID FL2 LEVEL OF CARE SCREENING TOOL     IDENTIFICATION  Patient Name: Brandy [name] Birthdate: September 27, 1932 Sex: female Admission Date (Current Location): 11/15/2016  [facility] and [place] Number:  Herbalist and Address:  [facility] [place],  [place], [place]      Provider Number: [id number]  Attending Physician Name and Address:  Isaac Bliss, [place]  Relative Name and Phone Number:       Current Level of Care: Hospital Recommended Level of Care: [place] Prior Approval Number:    Date Approved/Denied:   PASRR Number:    Discharge Plan: SNF    Current Diagnoses: Patient Active Problem List   Diagnosis Date Noted  . S/P exploratory laparotomy 11/17/2016  . Small bowel obstruction ([place]) 11/17/2016  . Pre-operative cardiovascular examination   . Incarcerated incisional hernia s/p SB resection & repair 11/17/2016 11/15/2016  . Acute renal failure (ARF) ([place]) 11/15/2016  . Hyperglycemia 11/15/2016  . Asthma without status asthmaticus 09/24/2016  . Carpal tunnel syndrome 09/24/2016  . Asthma 09/24/2016  . Dark stools 09/24/2016  . Degenerative arthritis of lumbar spine 09/24/2016  . DJD (degenerative joint disease), cervical 09/24/2016  . Dyspnea on exertion 09/24/2016  . Generalized anxiety disorder 09/24/2016  . Lumbosacral spondylosis without myelopathy 09/24/2016  . Morbid (severe) obesity due to excess calories ([place]) 09/24/2016  . Pernicious anemia 09/24/2016  . Pure hypercholesterolemia 09/24/2016  . Right knee pain 09/24/2016  . Sleep disorder 09/24/2016  . Vitamin B 12 deficiency 09/24/2016  . Vitamin D deficiency 09/24/2016  . Gastric ulcer   . Iron deficiency anemia due to chronic blood loss   . Abdominal pain, chronic, epigastric   . Acute gastric ulcer without hemorrhage or perforation   . Duodenal ulcer   . Closed fracture of nasal septum 08/27/2015  . Closed fracture of zygomatic  arch ([place]) 08/27/2015  . Protein-calorie malnutrition, severe ([place]) 12/18/2013  . Choledocholithiasis 12/17/2013  . Sepsis ([place]) 12/17/2013  . Osteopenia 11/17/2012  . Moderate aortic stenosis 10/17/2012  . Hx of radiation therapy   . Depression   . History of kidney stones   . Urinary urgency   . Gastroesophageal reflux disease without esophagitis   . Hyperlipidemia   . Essential hypertension   . Bronchitis   . Arthritis   . Dysphagia   . H/O hiatal hernia   . Primary cancer of upper outer quadrant of left female breast ([place]) 12/01/2011    Orientation RESPIRATION BLADDER Height & Weight     Self, Place  Normal Indwelling catheter Weight: 160 lb 15 oz (73 kg) Height:  4\' 11"  (149.9 cm)  BEHAVIORAL SYMPTOMS/MOOD NEUROLOGICAL BOWEL NUTRITION STATUS  Other (Comment) (No Behaviors)   Continent  (Clear liquid diet- expect to progress)  AMBULATORY STATUS COMMUNICATION OF NEEDS Skin   Extensive Assist Verbally Surgical wounds, Wound Vac                       Personal Care Assistance Level of Assistance  Bathing, Feeding, Dressing Bathing Assistance: Maximum assistance Feeding assistance: Independent Dressing Assistance: Maximum assistance     Functional Limitations Info  Sight, Hearing, Speech Sight Info: Adequate Hearing Info: Adequate Speech Info: Adequate    SPECIAL CARE FACTORS FREQUENCY  PT (By licensed PT), OT (By licensed OT)     PT Frequency: 5x wk OT Frequency: 5x wk            Contractures Contractures Info:  Not present    Additional Factors Info  Code Status Code Status Info: DNR             Current Medications (11/23/2016):  This is the current hospital active medication list Current Facility-Administered Medications  Medication Dose Route Frequency Provider Last Rate Last Dose  . chlorhexidine (PERIDEX) 0.12 % solution 15 mL  15 mL Mouth Rinse BID Rigoberto Noel, MD   15 mL at 11/23/16 1032  . Chlorhexidine Gluconate Cloth 2 % PADS 6  each  6 each Topical Q0600 Annita Brod, MD   6 each at 11/23/16 0600  . dextrose 5 %-0.45 % sodium chloride infusion   Intravenous Continuous Rigoberto Noel, MD 50 mL/hr at 11/23/16 1034    . enoxaparin (LOVENOX) injection 40 mg  40 mg Subcutaneous Q24H Rolm Bookbinder, MD   40 mg at 11/23/16 1032  . famotidine (PEPCID) IVPB 20 mg premix  20 mg Intravenous Q24H de Dios, Mike Gip, MD   Stopped at 11/22/16 2245  . fentaNYL (SUBLIMAZE) injection 25-50 mcg  25-50 mcg Intravenous Q2H PRN Erick Colace, NP   25 mcg at 11/23/16 0236  . ipratropium-albuterol (DUONEB) 0.5-2.5 (3) MG/3ML nebulizer solution 3 mL  3 mL Nebulization Q4H PRN Wilhelmina Mcardle, MD      . MEDLINE mouth rinse  15 mL Mouth Rinse q12n4p Rigoberto Noel, MD   15 mL at 11/22/16 1624  . ondansetron (ZOFRAN-ODT) disintegrating tablet 4 mg  4 mg Oral Q6H PRN Armandina Gemma, MD       Or  . ondansetron (ZOFRAN) injection 4 mg  4 mg Intravenous Q6H PRN Armandina Gemma, MD      . sodium chloride flush (NS) 0.9 % injection 10-40 mL  10-40 mL Intracatheter PRN Isaac Bliss, Rayford Halsted, MD       Facility-Administered Medications Ordered in Other Encounters  Medication Dose Route Frequency Provider Last Rate Last Dose  . sodium chloride 0.9 % injection 10 mL  10 mL Intracatheter PRN Marcy Panning, MD   10 mL at 03/14/12 1628     Discharge Medications: Please see discharge summary for a list of discharge medications.  Relevant Imaging Results:  Relevant Lab Results:   Additional Information SS # [id number]  Jahniya Duzan, Randall An, LCSW

## 2016-11-24 DIAGNOSIS — K430 Incisional hernia with obstruction, without gangrene: Secondary | ICD-10-CM | POA: Diagnosis not present

## 2016-11-24 DIAGNOSIS — N179 Acute kidney failure, unspecified: Secondary | ICD-10-CM | POA: Diagnosis not present

## 2016-11-24 LAB — BASIC METABOLIC PANEL
ANION GAP: 7 (ref 5–15)
BUN: 7 mg/dL (ref 6–20)
CHLORIDE: 104 mmol/L (ref 101–111)
CO2: 27 mmol/L (ref 22–32)
Calcium: 7.7 mg/dL — ABNORMAL LOW (ref 8.9–10.3)
Creatinine, Ser: 0.6 mg/dL (ref 0.44–1.00)
GFR calc Af Amer: >60 mL/min (ref >60)
GFR calc non Af Amer: >60 mL/min (ref >60)
GLUCOSE: 125 mg/dL — AB (ref 65–99)
POTASSIUM: 3.6 mmol/L (ref 3.5–5.1)
Sodium: 138 mmol/L (ref 135–145)

## 2016-11-24 LAB — CBC
HEMATOCRIT: 25.1 % — AB (ref 36.0–46.0)
HEMOGLOBIN: 8.1 g/dL — AB (ref 12.0–15.0)
MCH: 28.3 pg (ref 26.0–34.0)
MCHC: 32.3 g/dL (ref 30.0–36.0)
MCV: 87.8 fL (ref 78.0–100.0)
Platelets: 246 10*3/uL (ref 150–400)
RBC: 2.86 MIL/uL — ABNORMAL LOW (ref 3.87–5.11)
RDW: 15.7 % — AB (ref 11.5–15.5)
WBC: 8.7 10*3/uL (ref 4.0–10.5)

## 2016-11-24 MED ORDER — OXYCODONE HCL 5 MG PO TABS
5.0000 mg | ORAL_TABLET | ORAL | Status: DC | PRN
  Administered 2016-11-24 – 2016-11-25 (×4): 5 mg via ORAL
  Filled 2016-11-24 (×4): qty 1

## 2016-11-24 MED ORDER — BOOST PLUS PO LIQD
237.0000 mL | Freq: Three times a day (TID) | ORAL | Status: DC
  Administered 2016-11-25 (×2): 237 mL via ORAL
  Filled 2016-11-24 (×4): qty 237

## 2016-11-24 MED ORDER — CITALOPRAM HYDROBROMIDE 20 MG PO TABS
40.0000 mg | ORAL_TABLET | Freq: Every day | ORAL | Status: DC
  Administered 2016-11-24 – 2016-11-25 (×2): 40 mg via ORAL
  Filled 2016-11-24 (×2): qty 2

## 2016-11-24 MED ORDER — ACETAMINOPHEN 325 MG PO TABS
650.0000 mg | ORAL_TABLET | Freq: Four times a day (QID) | ORAL | Status: DC | PRN
  Administered 2016-11-25: 650 mg via ORAL
  Filled 2016-11-24: qty 2

## 2016-11-24 MED ORDER — DOCUSATE SODIUM 100 MG PO CAPS
100.0000 mg | ORAL_CAPSULE | Freq: Every day | ORAL | Status: DC
  Administered 2016-11-24 – 2016-11-25 (×2): 100 mg via ORAL
  Filled 2016-11-24 (×2): qty 1

## 2016-11-24 MED ORDER — BOOST / RESOURCE BREEZE PO LIQD
1.0000 | Freq: Three times a day (TID) | ORAL | Status: DC
  Administered 2016-11-24: 1 via ORAL

## 2016-11-24 NOTE — Progress Notes (Signed)
Patient ID: Brandy [name], female   DOB: 1932-07-30, 83 y.o.   MRN: [id number]  [facility] Surgery Progress Note  7 Days Post-Op  Subjective: CC- ventral incisional hernia Feeling well today. Tolerating clear liquids. Had 2 loose BM's yesterday. Denies n/v. Denies abdominal pain. Ambulated in hall with PT yesterday. She did have a TMAX of 101 over night, WBC is WNL.  Objective: Vital signs in last 24 hours: Temp:  [98.8 F (37.1 C)-101 F (38.3 C)] 98.8 F (37.1 C) (05/22 0503) Pulse Rate:  [70-88] 79 (05/22 0503) Resp:  [16-18] 16 (05/22 0503) BP: (126-140)/(52-60) 137/59 (05/22 0503) SpO2:  [95 %-100 %] 95 % (05/22 0503) Last BM Date: 11/22/16  Intake/Output from previous day: 05/21 0701 - 05/22 0700 In: 1607 [P.O.:300; I.V.:1225; IV Piggyback:50] Out: 1800 [Urine:1750; Drains:50] Intake/Output this shift: No intake/output data recorded.  PE: Gen:  Alert, NAD, pleasant Pulm:  CTAB, no W/R/R, effort normal Abd: Soft, ND, NT, +BS, midline vac in place   Lab Results:   Recent Labs  11/22/16 0426 11/24/16 0509  WBC 9.5 8.7  HGB 8.4* 8.1*  HCT 25.7* 25.1*  PLT 256 246   BMET  Recent Labs  11/22/16 0426 11/24/16 0509  NA 137 138  K 3.6 3.6  CL 105 104  CO2 25 27  GLUCOSE 120* 125*  BUN 10 7  CREATININE 0.55 0.60  CALCIUM 7.7* 7.7*   PT/INR No results for input(s): LABPROT, INR in the last 72 hours. CMP     Component Value Date/Time   NA 138 11/24/2016 0509   NA 141 01/13/2016 1354   K 3.6 11/24/2016 0509   K 4.3 01/13/2016 1354   CL 104 11/24/2016 0509   CL 108 (H) 12/28/2012 1343   CO2 27 11/24/2016 0509   CO2 22 01/13/2016 1354   GLUCOSE 125 (H) 11/24/2016 0509   GLUCOSE 135 01/13/2016 1354   GLUCOSE 99 12/28/2012 1343   BUN 7 11/24/2016 0509   BUN 21.6 01/13/2016 1354   CREATININE 0.60 11/24/2016 0509   CREATININE 1.0 01/13/2016 1354   CALCIUM 7.7 (L) 11/24/2016 0509   CALCIUM 9.6 01/13/2016 1354   PROT 7.8 11/15/2016 1850    PROT 6.9 01/13/2016 1354   ALBUMIN 3.9 11/15/2016 1850   ALBUMIN 3.5 01/13/2016 1354   AST 27 11/15/2016 1850   AST 15 01/13/2016 1354   ALT 26 11/15/2016 1850   ALT 11 01/13/2016 1354   ALKPHOS 64 11/15/2016 1850   ALKPHOS 75 01/13/2016 1354   BILITOT 0.8 11/15/2016 1850   BILITOT <0.30 01/13/2016 1354   GFRNONAA >60 11/24/2016 0509   GFRAA >60 11/24/2016 0509   Lipase     Component Value Date/Time   LIPASE 24 11/15/2016 1850       Studies/Results: Dg Abd Portable 1v  Result Date: 11/22/2016 CLINICAL DATA:  NG tube placement EXAM: PORTABLE ABDOMEN - 1 VIEW COMPARISON:  11/17/2016 FINDINGS: NG tube tip is in the proximal stomach with the side port in the distal esophagus. Nonobstructive bowel gas pattern. IMPRESSION: NG tube tip in the proximal stomach with the side port in the distal esophagus. Electronically Signed   By: Rolm Baptise M.D.   On: 11/22/2016 08:01    Anti-infectives: Anti-infectives    Start     Dose/Rate Route Frequency Ordered Stop   11/18/16 0600  ceFAZolin (ANCEF) IVPB 2g/100 mL premix     2 g 200 mL/hr over 30 Minutes Intravenous On call to O.R. 11/17/16 1301 11/17/16 1458  11/17/16 2000  ertapenem (INVANZ) 1 g in sodium chloride 0.9 % 50 mL IVPB  Status:  Discontinued     1 g 100 mL/hr over 30 Minutes Intravenous Every 24 hours 11/17/16 1910 11/22/16 0900   11/17/16 1410  ceFAZolin (ANCEF) 2-4 GM/100ML-% IVPB    Comments:  Virgia [place]   : cabinet override      11/17/16 1410 11/18/16 0214       Assessment/Plan Incarcerated ventral incisional hernia, small bowel obstruction S/p ex lap, loa, sbr, ventral incisional hernia repair with biologic mesh patch 5/16 Dr. Harlow Asa - POD 7  Plan - Advance to soft diet today and add Boost TID. Continue PT/mobilization. Encourage IS. Vac change tomorrow. Recheck CBC in AM.    LOS: 9 days    Jerrye Beavers , [facility] Surgery 11/24/2016, 7:33 AM Pager: [phone] Consults:  [phone] Mon-Fri 7:00 am-4:30 pm Sat-Sun 7:00 am-11:30 am

## 2016-11-24 NOTE — Progress Notes (Signed)
PROGRESS NOTE    Brandy [name]  QTM:[id number] DOB: Oct 15, 1932 DOA: 11/15/2016 PCP: Kathyrn Lass, MD     Brief Narrative:  83 y/o woman admitted to the hospital from home on 5/13 due to abd pain, n/v found to have an incarcerated hernia. Taken to the OR on 5/15 for repair. Has now been extubated and doing well. Has had return of bowel function. NG was removed. Diet advanced. Temp of 101 last pm, no recurrence. CSW has started the insurance authorization process for SNF.  Assessment & Plan:   Principal Problem:   Incarcerated incisional hernia s/p SB resection & repair 11/17/2016 Active Problems:   Depression   Gastroesophageal reflux disease without esophagitis   Hyperlipidemia   Essential hypertension   Protein-calorie malnutrition, severe (HCC)   Acute renal failure (ARF) (HCC)   Hyperglycemia   Pre-operative cardiovascular examination   S/P exploratory laparotomy   Small bowel obstruction (HCC)   Incarcerated Incisional Hernia -S/p repair on 5/15. -Surgery following: has had return of bowel function, NG removed, diet advanced. -Has a wound vac: management as per surgery.  Fever -Of 101 last pm; no recurrence. -If has repeat temp, consider UA, CXR and cultures. -No plan to treat at this time. -Foley to be removed today.  HTN -Fair control. -Consider restarting home meds slowly tomorrow as long as tolerates diet.  ARF -Resolved.  Acute Metabolic Encephalopathy -Resolved. -Suspect related to sedating meds and anesthetics received for surgery plus clinical illness.     DVT prophylaxis: SCDs Code Status: DNR Family Communication: daughter at bedside Disposition Plan: Continue diet advancement, will DC to SNF once auth received. Consultants:   Surgery  CCM  Procedures:   Incarcerated hernia repair  Antimicrobials:  Anti-infectives    Start     Dose/Rate Route Frequency Ordered Stop   11/18/16 0600  ceFAZolin (ANCEF) IVPB 2g/100 mL premix     2  g 200 mL/hr over 30 Minutes Intravenous On call to O.R. 11/17/16 1301 11/17/16 1458   11/17/16 2000  ertapenem (INVANZ) 1 g in sodium chloride 0.9 % 50 mL IVPB  Status:  Discontinued     1 g 100 mL/hr over 30 Minutes Intravenous Every 24 hours 11/17/16 1910 11/22/16 0900   11/17/16 1410  ceFAZolin (ANCEF) 2-4 GM/100ML-% IVPB    Comments:  Virgia [place]   : cabinet override      11/17/16 1410 11/18/16 0214       Subjective: Feels well, lying in bed, no complaints.  Objective: Vitals:   11/23/16 0530 11/23/16 1400 11/23/16 2135 11/24/16 0503  BP: (!) 153/62 140/60 (!) 126/52 (!) 137/59  Pulse: 68 70 88 79  Resp: 17 18 16 16   Temp: 99.6 F (37.6 C) 98.9 F (37.2 C) (!) 101 F (38.3 C) 98.8 F (37.1 C)  TempSrc: Oral Oral Oral Oral  SpO2: 96% 100% 95% 95%  Weight:      Height:        Intake/Output Summary (Last 24 hours) at 11/24/16 1049 Last data filed at 11/24/16 0600  Gross per 24 hour  Intake             1350 ml  Output             1300 ml  Net               50 ml   Filed Weights   11/16/16 0019  Weight: 73 kg (160 lb 15 oz)    Examination:  General exam: Alert,  awake, oriented x 3 Respiratory system: Clear to auscultation. Respiratory effort normal. Cardiovascular system:RRR. No murmurs, rubs, gallops. Gastrointestinal system: Abdomen is nondistended, soft and nontender. VAC in place. Central nervous system: Alert and oriented. No focal neurological deficits. Extremities: No C/C/E, +pedal pulses Skin: No rashes, lesions or ulcers Psychiatry: Judgement and insight appear normal. Mood & affect appropriate.        Data Reviewed: I have personally reviewed following labs and imaging studies  CBC:  Recent Labs Lab 11/18/16 0411 11/19/16 0405 11/20/16 0408 11/21/16 0400 11/22/16 0426 11/24/16 0509  WBC 20.0* 17.8* 14.6* 10.5 9.5 8.7  NEUTROABS 17.9* 13.4* 11.5* 7.4 6.6  --   HGB 10.7* 9.3* 8.8* 8.4* 8.4* 8.1*  HCT 32.3* 28.0* 27.0* 26.2* 25.7*  25.1*  MCV 88.7 88.9 88.8 89.1 88.6 87.8  PLT 288 281 268 251 256 094   Basic Metabolic Panel:  Recent Labs Lab 11/18/16 0411 11/19/16 0405 11/20/16 0408 11/21/16 0400 11/22/16 0426 11/24/16 0509  NA 138 138 140 140 137 138  K 4.7 4.4 3.8 3.9 3.6 3.6  CL 108 108 110 109 105 104  CO2 22 24 24 25 25 27   GLUCOSE 205* 162* 146* 117* 120* 125*  BUN 37* 41* 21* 13 10 7   CREATININE 1.11* 1.24* 0.70 0.60 0.55 0.60  CALCIUM 7.9* 7.5* 7.6* 7.6* 7.7* 7.7*  MG 1.8 1.9 2.0 2.0 1.9  --   PHOS 3.8 2.0* 1.8* 2.2* 2.0*  --    GFR: Estimated Creatinine Clearance: 48 mL/min (by C-G formula based on SCr of 0.6 mg/dL). Liver Function Tests: No results for input(s): AST, ALT, ALKPHOS, BILITOT, PROT, ALBUMIN in the last 168 hours. No results for input(s): LIPASE, AMYLASE in the last 168 hours. No results for input(s): AMMONIA in the last 168 hours. Coagulation Profile: No results for input(s): INR, PROTIME in the last 168 hours. Cardiac Enzymes: No results for input(s): CKTOTAL, CKMB, CKMBINDEX, TROPONINI in the last 168 hours. BNP (last 3 results) No results for input(s): PROBNP in the last 8760 hours. HbA1C: No results for input(s): HGBA1C in the last 72 hours. CBG: No results for input(s): GLUCAP in the last 168 hours. Lipid Profile: No results for input(s): CHOL, HDL, LDLCALC, TRIG, CHOLHDL, LDLDIRECT in the last 72 hours. Thyroid Function Tests: No results for input(s): TSH, T4TOTAL, FREET4, T3FREE, THYROIDAB in the last 72 hours. Anemia Panel: No results for input(s): VITAMINB12, FOLATE, FERRITIN, TIBC, IRON, RETICCTPCT in the last 72 hours. Urine analysis:    Component Value Date/Time   COLORURINE AMBER (A) 11/15/2016 2253   APPEARANCEUR CLOUDY (A) 11/15/2016 2253   LABSPEC 1.017 11/15/2016 2253   PHURINE 5.0 11/15/2016 2253   GLUCOSEU NEGATIVE 11/15/2016 2253   HGBUR NEGATIVE 11/15/2016 2253   BILIRUBINUR SMALL (A) 11/15/2016 2253   KETONESUR NEGATIVE 11/15/2016 2253    PROTEINUR NEGATIVE 11/15/2016 2253   UROBILINOGEN 1.0 10/10/2009 1100   NITRITE NEGATIVE 11/15/2016 2253   LEUKOCYTESUR NEGATIVE 11/15/2016 2253   Sepsis Labs: @LABRCNTIP (procalcitonin:4,lacticidven:4)  ) Recent Results (from the past 240 hour(s))  MRSA PCR Screening     Status: None   Collection Time: 11/17/16  1:41 PM  Result Value Ref Range Status   MRSA by PCR NEGATIVE NEGATIVE Final    Comment:        The GeneXpert MRSA Assay (FDA approved for NASAL specimens only), is one component of a comprehensive MRSA colonization surveillance program. It is not intended to diagnose MRSA infection nor to guide or monitor treatment for MRSA  infections.          Radiology Studies: No results found.      Scheduled Meds: . chlorhexidine  15 mL Mouth Rinse BID  . Chlorhexidine Gluconate Cloth  6 each Topical Q0600  . docusate sodium  100 mg Oral Daily  . enoxaparin (LOVENOX) injection  40 mg Subcutaneous Q24H  . feeding supplement  1 Container Oral TID BM  . mouth rinse  15 mL Mouth Rinse q12n4p  . phosphorus  500 mg Oral BID   Continuous Infusions: . dextrose 5 % and 0.45% NaCl 50 mL/hr at 11/24/16 0820  . famotidine (PEPCID) IV Stopped (11/23/16 2250)     LOS: 9 days    Time spent: 25 minutes. Greater than 50% of this time was spent in direct contact with the patient coordinating care.     Lelon Frohlich, MD [facility] Pager [phone]  If 7PM-7AM, please contact night-coverage [email or web address] Password [facility] 11/24/2016, 10:49 AM

## 2016-11-24 NOTE — Progress Notes (Signed)
CSW assisting with d/c planning. Family has chosen [facility] for [employer]. SNF will request authorization from [facility] for SNF placement. CSW will continue to follow to assist with d/c planning.  Werner Lean LCSW [phone]

## 2016-11-24 NOTE — Progress Notes (Signed)
Physical Therapy Treatment Patient Details Name: ELLYN [name] MRN: [id number] DOB: 02/11/1933 Today's Date: 11/24/2016    History of Present Illness 83 yo female admitted 5/13 with SBO, incarcerated hernia. S/P exploratory lap 11/18/16 for repair of hernia, Small Bowel resection  lysis of adhesions.    PT Comments    Assisted pt OOB to amb to bathroom (11 feet) + 2 assist for safety/equipment.  Assisted with hygiene then amb another 8 feet to recliner per pt request to sit and rest a min.  Then amb another 24 feet + 2 assist while daughter assisted by following with recliner.  Per daughter, pt plans to D/C to [facility] for ST Rehab.  Follow Up Recommendations  SNF     Equipment Recommendations       Recommendations for Other Services       Precautions / Restrictions Precautions Precautions: Fall Precaution Comments: abdominal surgery, wound VAC, "bad" R knee Restrictions Weight Bearing Restrictions: No    Mobility  Bed Mobility Overal bed mobility: Needs Assistance Bed Mobility: Supine to Sit     Supine to sit: +2 for physical assistance;+2 for safety/equipment;Mod assist     General bed mobility comments: 25% VC's on proper tech and increased time esp to scoot to EOB  Transfers Overall transfer level: Needs assistance Equipment used: 2 person hand held assist;Rolling walker (2 wheeled) Transfers: Sit to/from [employer] Transfers Sit to Stand: +2 physical assistance;+2 safety/equipment;Mod assist Stand pivot transfers: +2 physical assistance;+2 safety/equipment;Mod assist       General transfer comment: 25% VC's on safety with turns and proper hand placement.  Also assisted to bathroom  Ambulation/Gait Ambulation/Gait assistance: +2 physical assistance;+2 safety/equipment;Mod assist Ambulation Distance (Feet): 42 Feet Assistive device: Rolling walker (2 wheeled) (youth) Gait Pattern/deviations: Step-to pattern;Decreased step length - left;Decreased step  length - right Gait velocity: decreased   General Gait Details: used RW this session.  Assisted with amb from bed to bathroom, then from bathroom to recliner, then again in hallway for a total of 42 feet with 3 sitting rest breaks.  Pt c/o R knee pain "old bad knee" with decreased stance time on R LE.  Daughter assisted by following with recliner.     Stairs            Wheelchair Mobility    Modified Rankin (Stroke Patients Only)       Balance                                            Cognition Arousal/Alertness: Awake/alert Behavior During Therapy: WFL for tasks assessed/performed Overall Cognitive Status: Within Functional Limits for tasks assessed                                        Exercises      General Comments        Pertinent Vitals/Pain Pain Assessment: Faces Faces Pain Scale: Hurts a little bit Pain Location: abdomen and R knee Pain Descriptors / Indicators: Grimacing Pain Intervention(s): Monitored during session;Repositioned    Home Living                      Prior Function            PT Goals (current goals can now be  found in the care plan section) Progress towards PT goals: Progressing toward goals    Frequency    Min 3X/week      PT Plan Current plan remains appropriate    Co-evaluation              AM-PAC PT "6 Clicks" Daily Activity  Outcome Measure  Difficulty turning over in bed (including adjusting bedclothes, sheets and blankets)?: A Lot Difficulty moving from lying on back to sitting on the side of the bed? : A Lot Difficulty sitting down on and standing up from a chair with arms (e.g., wheelchair, bedside commode, etc,.)?: A Lot Help needed moving to and from a bed to chair (including a wheelchair)?: A Lot Help needed walking in hospital room?: A Lot Help needed climbing 3-5 steps with a railing? : Total 6 Click Score: 11    End of Session Equipment Utilized  During Treatment: Gait belt Activity Tolerance: Patient tolerated treatment well Patient left: in chair;with nursing/sitter in room;with call bell/phone within reach;with family/visitor present Nurse Communication: Mobility status PT Visit Diagnosis: Difficulty in walking, not elsewhere classified (R26.2);Pain     Time: 1050-1115 PT Time Calculation (min) (ACUTE ONLY): 25 min  Charges:  $Gait Training: 8-22 mins $Therapeutic Activity: 8-22 mins                    G Codes:       {Jennie Bolar  PTA [facility]  Acute  Rehab Pager      [phone]

## 2016-11-24 NOTE — Progress Notes (Signed)
Nutrition Follow-up  DOCUMENTATION CODES:   Severe malnutrition in context of acute illness/injury, Obesity unspecified  INTERVENTION:   D/c Boost Breeze Provide Boost Plus chocolate TID- Each supplement provides 360kcal and 14g protein.   Continue to encourage PO intake of meals and supplements  RD to continue to monitor   NUTRITION DIAGNOSIS:   Malnutrition (severe) related to acute illness (incarcerated ventral hernia) as evidenced by energy intake < or equal to 50% for > or equal to 5 days, percent weight loss.  Ongoing.  GOAL:   Patient will meet greater than or equal to 90% of their needs  Not meeting.  MONITOR:   PO intake, Supplement acceptance, Labs, Weight trends, Skin, I & O's  ASSESSMENT:   83 y.o. female with medical history significant of HTN, HLD, remote breast cancer, GERD, and gallstones without h/o cholecystectomy presenting with n/v which began last Tuesday night.  She has been vomiting ever since.  Called Dr. Ardis Hughs, who called in some nausea pills which helped a bit.  Vomited about 2-3 times daily.  Pain in the area around her ventral hernia since Tuesday.  Pain is better following hernia reduction in the ER.  Last BM was Wednesday or Thursday and was normal; it is unusual for her to go so long between BMs. On 5/15 pt underwent ex lap with LOA and repair of incarcerated ventral hernia.  5/15: s/p EXPLORATORY LAPAROTOMY WITH LYSIS OF ADHESIONS, SMALL BOWEL RESECTION, REPAIR OF INCARCERATED VENTRAL INCISIONAL HERNIA, INSERTION OF BIOLOGIC MESH PATCH,APPLICATION OF WOUND VAC DRESSING (N/A)  Patient in room with no family at bedside. Pt reports only consuming broth, jello and coffee this morning for breakfast. Pt denies any issues swallowing since extubation. Pt very flat in affect and did not provide any other information. Pt states the Boost Breeze supplements are "okay". She is willing to try chocolate Boost, will switch order.  Per discussion with RN, pt has  not been on her Celexa and always has a flat affect. Pt with poor appetite and RN has tried to encourage pt to eat with not much success. MD has ordered the pt's Celexa and hopefully this will help with appetite and mental status.  Medications: Colace capsule daily, K-Phos tablet BID, D5 -.45% NaCl infusion at 50 ml/hr -provides 204 kcal Labs reviewed: Low Phos Mg WNL  Diet Order:  DIET SOFT Room service appropriate? Yes; Fluid consistency: Thin  Skin:  Wound (see comment) (Abdominal incision with wound vac from 11/17/16)  Last BM:  5/21  Height:   Ht Readings from Last 1 Encounters:  11/17/16 4\' 11"  (1.499 m)    Weight:   Wt Readings from Last 1 Encounters:  11/16/16 160 lb 15 oz (73 kg)    Ideal Body Weight:  42.91 kg  BMI:  Body mass index is 32.51 kg/m.  Estimated Nutritional Needs:   Kcal:  1250-1450  Protein:  55-65g  Fluid:  1.5L/day  EDUCATION NEEDS:   No education needs identified at this time  Clayton Bibles, MS, RD, LDN Pager: [phone] After Hours Pager: [phone]

## 2016-11-25 DIAGNOSIS — E43 Unspecified severe protein-calorie malnutrition: Secondary | ICD-10-CM | POA: Diagnosis not present

## 2016-11-25 DIAGNOSIS — R600 Localized edema: Secondary | ICD-10-CM | POA: Diagnosis not present

## 2016-11-25 DIAGNOSIS — S43006A Unspecified dislocation of unspecified shoulder joint, initial encounter: Secondary | ICD-10-CM | POA: Diagnosis not present

## 2016-11-25 DIAGNOSIS — R1084 Generalized abdominal pain: Secondary | ICD-10-CM | POA: Diagnosis not present

## 2016-11-25 DIAGNOSIS — S3092XA Unspecified superficial injury of abdominal wall, initial encounter: Secondary | ICD-10-CM | POA: Diagnosis not present

## 2016-11-25 DIAGNOSIS — E878 Other disorders of electrolyte and fluid balance, not elsewhere classified: Secondary | ICD-10-CM | POA: Diagnosis not present

## 2016-11-25 DIAGNOSIS — L98499 Non-pressure chronic ulcer of skin of other sites with unspecified severity: Secondary | ICD-10-CM | POA: Diagnosis not present

## 2016-11-25 DIAGNOSIS — K253 Acute gastric ulcer without hemorrhage or perforation: Secondary | ICD-10-CM | POA: Diagnosis not present

## 2016-11-25 DIAGNOSIS — D649 Anemia, unspecified: Secondary | ICD-10-CM | POA: Diagnosis not present

## 2016-11-25 DIAGNOSIS — J189 Pneumonia, unspecified organism: Secondary | ICD-10-CM | POA: Diagnosis not present

## 2016-11-25 DIAGNOSIS — K430 Incisional hernia with obstruction, without gangrene: Secondary | ICD-10-CM | POA: Diagnosis not present

## 2016-11-25 DIAGNOSIS — K219 Gastro-esophageal reflux disease without esophagitis: Secondary | ICD-10-CM | POA: Diagnosis not present

## 2016-11-25 DIAGNOSIS — J4541 Moderate persistent asthma with (acute) exacerbation: Secondary | ICD-10-CM | POA: Diagnosis not present

## 2016-11-25 DIAGNOSIS — R739 Hyperglycemia, unspecified: Secondary | ICD-10-CM | POA: Diagnosis not present

## 2016-11-25 DIAGNOSIS — I350 Nonrheumatic aortic (valve) stenosis: Secondary | ICD-10-CM | POA: Diagnosis not present

## 2016-11-25 DIAGNOSIS — Z9012 Acquired absence of left breast and nipple: Secondary | ICD-10-CM | POA: Diagnosis not present

## 2016-11-25 DIAGNOSIS — K5649 Other impaction of intestine: Secondary | ICD-10-CM | POA: Diagnosis not present

## 2016-11-25 DIAGNOSIS — M2550 Pain in unspecified joint: Secondary | ICD-10-CM | POA: Diagnosis not present

## 2016-11-25 DIAGNOSIS — E785 Hyperlipidemia, unspecified: Secondary | ICD-10-CM | POA: Diagnosis not present

## 2016-11-25 DIAGNOSIS — I10 Essential (primary) hypertension: Secondary | ICD-10-CM | POA: Diagnosis not present

## 2016-11-25 DIAGNOSIS — R69 Illness, unspecified: Secondary | ICD-10-CM | POA: Diagnosis not present

## 2016-11-25 DIAGNOSIS — F418 Other specified anxiety disorders: Secondary | ICD-10-CM | POA: Diagnosis not present

## 2016-11-25 LAB — CBC
HCT: 26.8 % — ABNORMAL LOW (ref 36.0–46.0)
Hemoglobin: 8.8 g/dL — ABNORMAL LOW (ref 12.0–15.0)
MCH: 28.9 pg (ref 26.0–34.0)
MCHC: 32.8 g/dL (ref 30.0–36.0)
MCV: 88.2 fL (ref 78.0–100.0)
Platelets: 270 10*3/uL (ref 150–400)
RBC: 3.04 MIL/uL — ABNORMAL LOW (ref 3.87–5.11)
RDW: 15.8 % — AB (ref 11.5–15.5)
WBC: 8.1 10*3/uL (ref 4.0–10.5)

## 2016-11-25 MED ORDER — DOCUSATE SODIUM 100 MG PO CAPS: 100.0000 mg | ORAL_CAPSULE | Freq: Every day | ORAL | 0 refills | Status: DC

## 2016-11-25 MED ORDER — OXYCODONE HCL 5 MG PO TABS: 5.0000 mg | ORAL_TABLET | ORAL | 0 refills | Status: DC | PRN

## 2016-11-25 NOTE — Progress Notes (Signed)
SNF bed available today at [facility] pending [employer] authorization. Expecting to have  authorization today. CSW will continue to follow to assist with SNF placement.  Werner Lean LCSW [phone]

## 2016-11-25 NOTE — Progress Notes (Signed)
Patient ID: Brandy [name], female   DOB: 1932-10-09, 83 y.o.   MRN: [id number]  [facility] Surgery Progress Note  8 Days Post-Op  Subjective: CC- ventral incisional hernia Sleepy this morning but feels well. Denies any abdominal pain. Tolerating soft diet. Denies n/v. Reports BM x1 yesterday. Working well with PT; was able to ambulate more than the day before. TMAX 99.5, WBC WNL.  Objective: Vital signs in last 24 hours: Temp:  [97.7 F (36.5 C)-99.5 F (37.5 C)] 99.5 F (37.5 C) (05/23 0507) Pulse Rate:  [75-80] 80 (05/23 0507) Resp:  [16] 16 (05/23 0507) BP: (112-131)/(49-109) 112/51 (05/23 0507) SpO2:  [93 %-95 %] 95 % (05/23 0507) Last BM Date: 11/23/16  Intake/Output from previous day: 05/22 0701 - 05/23 0700 In: 1150 [P.O.:600; I.V.:550] Out: 600 [Urine:600] Intake/Output this shift: No intake/output data recorded.  PE: Gen: Alert, NAD, pleasant Pulm: CTAB, no W/R/R, effort normal Abd: Soft, ND, NT, +BS, midline vac in place  Lab Results:   Recent Labs  11/24/16 0509 11/25/16 0515  WBC 8.7 8.1  HGB 8.1* 8.8*  HCT 25.1* 26.8*  PLT 246 270   BMET  Recent Labs  11/24/16 0509  NA 138  K 3.6  CL 104  CO2 27  GLUCOSE 125*  BUN 7  CREATININE 0.60  CALCIUM 7.7*   PT/INR No results for input(s): LABPROT, INR in the last 72 hours. CMP     Component Value Date/Time   NA 138 11/24/2016 0509   NA 141 01/13/2016 1354   K 3.6 11/24/2016 0509   K 4.3 01/13/2016 1354   CL 104 11/24/2016 0509   CL 108 (H) 12/28/2012 1343   CO2 27 11/24/2016 0509   CO2 22 01/13/2016 1354   GLUCOSE 125 (H) 11/24/2016 0509   GLUCOSE 135 01/13/2016 1354   GLUCOSE 99 12/28/2012 1343   BUN 7 11/24/2016 0509   BUN 21.6 01/13/2016 1354   CREATININE 0.60 11/24/2016 0509   CREATININE 1.0 01/13/2016 1354   CALCIUM 7.7 (L) 11/24/2016 0509   CALCIUM 9.6 01/13/2016 1354   PROT 7.8 11/15/2016 1850   PROT 6.9 01/13/2016 1354   ALBUMIN 3.9 11/15/2016 1850   ALBUMIN 3.5  01/13/2016 1354   AST 27 11/15/2016 1850   AST 15 01/13/2016 1354   ALT 26 11/15/2016 1850   ALT 11 01/13/2016 1354   ALKPHOS 64 11/15/2016 1850   ALKPHOS 75 01/13/2016 1354   BILITOT 0.8 11/15/2016 1850   BILITOT <0.30 01/13/2016 1354   GFRNONAA >60 11/24/2016 0509   GFRAA >60 11/24/2016 0509   Lipase     Component Value Date/Time   LIPASE 24 11/15/2016 1850       Studies/Results: No results found.  Anti-infectives: Anti-infectives    Start     Dose/Rate Route Frequency Ordered Stop   11/18/16 0600  ceFAZolin (ANCEF) IVPB 2g/100 mL premix     2 g 200 mL/hr over 30 Minutes Intravenous On call to O.R. 11/17/16 1301 11/17/16 1458   11/17/16 2000  ertapenem (INVANZ) 1 g in sodium chloride 0.9 % 50 mL IVPB  Status:  Discontinued     1 g 100 mL/hr over 30 Minutes Intravenous Every 24 hours 11/17/16 1910 11/22/16 0900   11/17/16 1410  ceFAZolin (ANCEF) 2-4 GM/100ML-% IVPB    Comments:  [place]gia [place]   : cabinet override      11/17/16 1410 11/18/16 0214       Assessment/Plan Incarcerated ventral incisional hernia, small bowel obstruction S/p ex  lap, loa, sbr, ventral incisional hernia repair with biologic mesh patch5/16 Dr. Harlow Asa - POD 8 - abdomen benign and patient having good bowel function  Plan - Advance to regular diet today. Continue PT/mobilization. Encourage IS.  Please page me [phone]) during vac change today. Awaiting SNF placement. At discharge patient will need f/u with Dr. Harlow Asa in about 2 weeks.    LOS: 10 days    Jerrye Beavers , [facility] Surgery 11/25/2016, 7:31 AM Pager: [phone] Consults: [phone] Mon-Fri 7:00 am-4:30 pm Sat-Sun 7:00 am-11:30 am

## 2016-11-25 NOTE — Progress Notes (Signed)
Physical Therapy Treatment Patient Details Name: Brandy [name] MRN: [id number] DOB: 14-Jan-1933 Today's Date: 11/25/2016    History of Present Illness 83 yo female admitted 5/13 with SBO, incarcerated hernia. S/P exploratory lap 11/18/16 for repair of hernia, Small Bowel resection  lysis of adhesions.    PT Comments    Pt OOB in recliner just finished a bath and hair wash.  Assisted out of recliner to amb 2 short distances.  Required + 2 assist for safety such that recliner was following.  Pt will need ST Rehab at SNF to regain her prior level of mobility amb with her cane.    Follow Up Recommendations  SNF     Equipment Recommendations       Recommendations for Other Services       Precautions / Restrictions Precautions Precautions: Fall Precaution Comments: abdominal surgery, wound VAC, "bad" R knee Restrictions Weight Bearing Restrictions: No    Mobility  Bed Mobility               General bed mobility comments: OOB in recliner  Transfers Overall transfer level: Needs assistance Equipment used: Rolling walker (2 wheeled) Transfers: Sit to/from [facility] Sit to Stand: +2 physical assistance;+2 safety/equipment;Mod assist Stand pivot transfers: +2 physical assistance;+2 safety/equipment;Mod assist       General transfer comment: 25% VC's on safety with turns and proper hand placement.  Also assisted to bathroom  Ambulation/Gait Ambulation/Gait assistance: +2 physical assistance;+2 safety/equipment;Mod assist Ambulation Distance (Feet): 23 Feet Assistive device: Rolling walker (2 wheeled) Gait Pattern/deviations: Step-to pattern;Decreased step length - left;Decreased step length - right Gait velocity: decreased   General Gait Details: decreased amb distance due to increased c/o "not feeling good today"   vitals taken all WNL.     Stairs            Wheelchair Mobility    Modified Rankin (Stroke Patients Only)       Balance                                             Cognition Arousal/Alertness: Awake/alert Behavior During Therapy: WFL for tasks assessed/performed Overall Cognitive Status: Within Functional Limits for tasks assessed                                        Exercises      General Comments        Pertinent Vitals/Pain Pain Assessment: Faces Faces Pain Scale: Hurts a little bit Pain Location: abdomen and R knee    Home Living                      Prior Function            PT Goals (current goals can now be found in the care plan section) Progress towards PT goals: Progressing toward goals    Frequency    Min 3X/week      PT Plan Current plan remains appropriate    Co-evaluation              AM-PAC PT "6 Clicks" Daily Activity  Outcome Measure  Difficulty turning over in bed (including adjusting bedclothes, sheets and blankets)?: A Lot Difficulty moving from lying on back to sitting on the side of  the bed? : A Lot Difficulty sitting down on and standing up from a chair with arms (e.g., wheelchair, bedside commode, etc,.)?: A Lot Help needed moving to and from a bed to chair (including a wheelchair)?: A Lot Help needed walking in hospital room?: A Lot Help needed climbing 3-5 steps with a railing? : Total 6 Click Score: 11    End of Session Equipment Utilized During Treatment: Gait belt Activity Tolerance: Patient tolerated treatment well Patient left: in chair;with nursing/sitter in room;with call bell/phone within reach;with family/visitor present Nurse Communication: Mobility status PT Visit Diagnosis: Difficulty in walking, not elsewhere classified (R26.2);Pain     Time: 1245-8099 PT Time Calculation (min) (ACUTE ONLY): 26 min  Charges:  $Gait Training: 8-22 mins $Therapeutic Activity: 8-22 mins                    G Codes:       Rica Koyanagi  PTA [facility]  Acute  Rehab Pager      [phone]

## 2016-11-25 NOTE — Discharge Summary (Signed)
Physician Discharge Summary  Brandy [name] ALP:[id number] DOB: 09-06-1932 DOA: 11/15/2016  PCP: Kathyrn Lass, MD  Admit date: 11/15/2016 Discharge date: 11/25/2016  Recommendations for Outpatient Follow-up:  1. Pt will need to follow up with PCP in 1 week post discharge 2. Please obtain BMP to evaluate electrolytes and kidney function 3. Please also check CBC to evaluate Hg and Hct levels 4. Please note that Diovan-HCTZ and Spironolactone temporarily held. Currently systolic BP is in 353-299'M range and once clinically indicated these two medications can be resumed  Discharge Diagnoses:  Principal Problem:   Incarcerated incisional hernia s/p SB resection & repair 11/17/2016 Active Problems:   Depression   Gastroesophageal reflux disease without esophagitis   Hyperlipidemia   Essential hypertension   Protein-calorie malnutrition, severe (HCC)   Acute renal failure (ARF) (HCC)   Hyperglycemia   Pre-operative cardiovascular examination   S/P exploratory laparotomy   Small bowel obstruction ([place])  Discharge Condition: Stable  Diet recommendation: Regular diet   History of present illness:  Pt is 83 yo female with known HTN, [facility] to [facility] with acute onset abd pain, N/V and found to have incarcerated hernia. Was taken to OR on 5/15, has been successfully extubated, NGT removed, diet advanced and pt tolerating well.   Assessment & Plan:   Principal Problem:   Incarcerated incisional hernia s/p SB resection & repair 11/17/2016 - s/p repair on 5/15, diet advanced and pt tolerating well so far but still not eating as much  - pt has wound vac, surgery following - use analgesia as needed   Active Problems:   Fever on 5/22 - foley has been removed - pt denies any abd or urinary concerns this am - no chest pain or dyspnea - no fever this AM    Depression - clinically stable this AM, wants to be discharged today     Gastroesophageal reflux disease without esophagitis - continue  home medical regimen     Hyperlipidemia   Essential hypertension - resume Norvasc and once BP tolerating, can resume Diovan and Spironolactone     Protein-calorie malnutrition, severe ([place]) - appreciate nutritionist following     Acute renal failure (ARF) ([place]) - pre renal in etiology  - resolved     Acute metabolic encephalopathy - resolved   DVT prophylaxis - Lovenox SQ CODE: DNR Family updated at bedside, daughter.  D/C SNF  Procedures/Studies: Ct Abdomen Pelvis Wo Contrast  Result Date: 11/15/2016 CLINICAL DATA:  Generalized abdominal pain and emesis x2 weeks. EXAM: CT ABDOMEN AND PELVIS WITHOUT CONTRAST TECHNIQUE: Multidetector CT imaging of the abdomen and pelvis was performed following the standard protocol without IV contrast. COMPARISON:  12/17/2013 FINDINGS: Lower chest: Cardiomegaly with aortic atherosclerosis, mitral annular calcifications and coronary arteriosclerosis. No pericardial effusion. There is atelectasis at the lung bases. Stable moderate-sized hiatal hernia. Hepatobiliary: Punctate calcification in the right hepatic dome consistent with the tiny granuloma or vascular calcification. No space-occupying mass of the liver noted. Gallbladder is nondistended. No gallstones are seen. No biliary dilatation is identified. Pancreas: Unremarkable. No pancreatic ductal dilatation or surrounding inflammatory changes.Normal maxilla normal Spleen: Normal in size without focal abnormality. Adrenals/Urinary Tract: Normal bilateral adrenal glands. Stable cortical and parapelvic cysts noted of both kidneys. Hyperdense exophytic lesion off the upper pole the right kidney is stable likely to represent a proteinaceous or hemorrhagic cyst, also stable in size in consistent with a benign finding given stability since 2015. No obstructive uropathy is identified. There are punctate calcifications within both kidneys which  may be secondary to vascular calcifications or nephrolithiasis.  Stomach/Bowel: Moderate fluid-filled distention of the stomach. Dilated fluid-filled small bowel is seen starting from the second portion of the duodenum leading up to a transition point within a left periumbilical ventral hernia, series 2, image 60. There is one right-sided periumbilical ventral hernia containing nondistended loops of ileum, a fat containing umbilical hernia, and two left-sided periumbilical ventral hernias, the larger of which demonstrates the transition point. The more medial of the left-sided hernias contains omental fat, nondistended ileum and fluid-filled distended jejunum up to 4.1 cm. A short segment of incarcerated jejunum is not entirely excluded. The more lateral left-sided hernia contains omental fat and nondistended ileum. Vascular/Lymphatic: Aortic atherosclerosis. No enlarged abdominal or pelvic lymph nodes. Reproductive: Atrophic appearance of the uterus consistent with patient's age. No adnexal mass. Other: No abdominopelvic ascites. Musculoskeletal: Thoracolumbar spondylosis. No acute nor suspicious osseous findings. IMPRESSION: 1. Three ventral, periumbilical hernias are identified containing small bowel, one to the right of the umbilicus and two to the left. The more medial of the left-sided ventral hernias demonstrates a transition point contributing to a high-grade small bowel obstruction. A short segment of fluid-filled dilated small bowel is seen measuring up to 4.1 cm in caliber within the medial left-sided hernia and incarceration is not entirely excluded. Correlate for reducibility. 2. Stable simple and complex cysts as well as punctate bilateral nephrolithiasis of the kidneys. 3. Stable cardiomegaly with mitral annular calcifications and coronary arteriosclerosis. Moderate-sized hiatal hernia. Electronically Signed   By: Ashley Royalty M.D.   On: 11/15/2016 21:19   Nm Myocar Multi W/spect W/wall Motion / Ef  Result Date: 11/17/2016 CLINICAL DATA:  Hypertension, asthma,  abdominal pain. Preop for surgery. EXAM: MYOCARDIAL IMAGING WITH SPECT (REST AND PHARMACOLOGIC-STRESS) GATED LEFT VENTRICULAR WALL MOTION STUDY LEFT VENTRICULAR EJECTION FRACTION TECHNIQUE: Standard myocardial SPECT imaging was performed after resting intravenous injection of 10 mCi Tc-10m tetrofosmin. Subsequently, intravenous infusion of Lexiscan was performed under the supervision of the Cardiology staff. At peak effect of the drug, 30 mCi Tc-15m tetrofosmin was injected intravenously and standard myocardial SPECT imaging was performed. Quantitative gated imaging was also performed to evaluate left ventricular wall motion, and estimate left ventricular ejection fraction. COMPARISON:  None. FINDINGS: Perfusion: Decreased activity in the inferior wall on both sets of images likely due to scar. No reversible defects to suggest ischemia. Wall Motion: Decrease thickening and activity in the inferior wall. The remainder the left ventricle demonstrates normal wall motion with an myocardial thickening with contraction. Left Ventricular Ejection Fraction: 71 % End diastolic volume 66 ml End systolic volume 19 ml IMPRESSION: 1. Suspect inferior wall scar/infarction. No findings for ischemia. 2. Normal left ventricular wall motion except for the inferior wall. 3. Left ventricular ejection fraction 71% 4. Non invasive risk stratification*: Intermediate *2012 Appropriate Use Criteria for Coronary Revascularization Focused Update: J Am Coll Cardiol. 6948;54(6):270-350. http://content.airportbarriers.com.aspx?articleid=[email or web address] Electronically Signed   By: Marijo Sanes M.D.   On: 11/17/2016 12:28   Dg Chest Port 1 View  Result Date: 11/19/2016 CLINICAL DATA:  Respiratory failure. EXAM: PORTABLE CHEST 1 VIEW COMPARISON:  11/17/2016. FINDINGS: Interim extubation. Right IJ line NG tube in stable position. Heart size normal. Low lung volumes with mild basilar atelectasis. No focal infiltrate. No pleural effusion or  pneumothorax. Surgical clips are noted over the chest. Left shoulder replacement. IMPRESSION: 1. Interim extubation.  Right IJ line NG tube in stable position. 2. Low lung volumes with mild basilar atelectasis. Electronically Signed   By: Marcello Moores  Register   On: 11/19/2016 06:55   Dg Chest Port 1 View  Result Date: 11/17/2016 CLINICAL DATA:  Central line placement, history of breast cancer, asthma, bronchitis. EXAM: PORTABLE CHEST 1 VIEW COMPARISON:  11/16/2016 FINDINGS: Right IJ central line catheter projects within the mid right atrium. No pneumothorax. Gastric tube extends into the expected location of the stomach with tip in the gastric antral region. Endotracheal tube tip is approximately 2.8 cm above the carina. Heart is borderline enlarged. Low lung volumes without pneumonic consolidation, CHF or pneumothorax. Left partially visualized shoulder arthroplasty. 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 IMPRESSION: 1. Right IJ central line tip in the right atrium.  No pneumothorax. 2. Slightly low-lying endotracheal tube tip at 2.8 cm above the carina. 3. Gastric tube is in the expected location of the stomach. 4. No pulmonary disease. Electronically Signed   By: Ashley Royalty M.D.   On: 11/17/2016 20:19   Dg Chest Port 1 View  Result Date: 11/16/2016 CLINICAL DATA:  Shortness of breath. EXAM: PORTABLE CHEST 1 VIEW COMPARISON:  08/21/2015. FINDINGS: Trachea is midline. Heart size stable. Lungs are clear. No pleural fluid. Degenerative changes in the right shoulder. Left shoulder arthroplasty is partially imaged. IMPRESSION: No acute findings. Electronically Signed   By: Lorin Picket M.D.   On: 11/16/2016 12:17   Dg Abd 2 Views  Result Date: 11/16/2016 CLINICAL DATA:  Nausea and vomiting EXAM: ABDOMEN - 2 VIEW COMPARISON:  CT abdomen and pelvis Nov 15, 2016 FINDINGS: Supine and upright images obtained. There is moderate stool throughout the colon. There are loops of  dilated small bowel multiple air-fluid levels. No free air. There are minimal pleural effusions bilaterally. IMPRESSION: Bowel gas pattern indicative of a degree of bowel obstruction. No free air evident. Electronically Signed   By: Lowella Grip III M.D.   On: 11/16/2016 08:54   Dg Abd Portable 1v  Result Date: 11/22/2016 CLINICAL DATA:  NG tube placement EXAM: PORTABLE ABDOMEN - 1 VIEW COMPARISON:  11/17/2016 FINDINGS: NG tube tip is in the proximal stomach with the side port in the distal esophagus. Nonobstructive bowel gas pattern. IMPRESSION: NG tube tip in the proximal stomach with the side port in the distal esophagus. Electronically Signed   By: Rolm Baptise M.D.   On: 11/22/2016 08:01   Dg Abd Portable 1v  Result Date: 11/17/2016 CLINICAL DATA:  Encounter for nasogastric tube placement EXAM: PORTABLE ABDOMEN - 1 VIEW COMPARISON:  11/16/2016 FINDINGS: The tip and side port of a gastric tube are in the expected location of the stomach with tip in the expected location of the distal stomach possibly the gastric antrum. No radio-opaque calculi or other significant radiographic abnormality are seen. A few scattered gas containing small and large bowel loops are noted. No no free air. The tip of a right-sided central line projects over the expected location of the right atrium. IMPRESSION: Gastric tube in the expected location of the stomach. Right-sided central line tip is noted in right atrium. Electronically Signed   By: Ashley Royalty M.D.   On: 11/17/2016 20:21     Discharge Exam: Vitals:   11/24/16 2151 11/25/16 0507  BP: (!) 114/49 (!) 112/51  Pulse: 76 80  Resp: 16 16  Temp: 97.7 F (36.5 C) 99.5 F (37.5 C)   Vitals:   11/24/16 0503 11/24/16 1446 11/24/16 2151 11/25/16 0507  BP: (!) 137/59 (!) 131/109 (!) 114/49 (!) 112/51  Pulse: 79 75 76 80  Resp: 16 16 16 16   Temp: 98.8 F (37.1 C) 99.5 F (37.5 C) 97.7 F (36.5 C) 99.5 F (37.5 C)  TempSrc: Oral Oral Oral Oral  SpO2:  95% 93% 94% 95%  Weight:      Height:        General: Pt is alert, follows commands appropriately, not in acute distress Cardiovascular: Regular rate and rhythm, S1/S2 +, no murmurs, no rubs, no gallops Respiratory: Clear to auscultation bilaterally, no wheezing, no crackles, no rhonchi Abdominal: Soft, non tender, non distended, wound vac in place Extremities: no edema, no cyanosis, pulses palpable bilaterally DP and PT  Discharge Instructions  Discharge Instructions    Diet - low sodium heart healthy    Complete by:  As directed    Increase activity slowly    Complete by:  As directed      Allergies as of 11/25/2016   No Known Allergies     Medication List    STOP taking these medications   spironolactone 25 MG tablet Commonly known as:  ALDACTONE   valsartan-hydrochlorothiazide 320-25 MG tablet Commonly known as:  DIOVAN-HCT     TAKE these medications   acetaminophen 500 MG tablet Commonly known as:  TYLENOL Take 1,000 mg by mouth every 6 (six) hours as needed (pain).   amLODipine 10 MG tablet Commonly known as:  NORVASC Take 10 mg by mouth every morning.   atorvastatin 40 MG tablet Commonly known as:  LIPITOR Take 1 tablet by mouth daily.   citalopram 40 MG tablet Commonly known as:  CELEXA Take 1 tablet by mouth daily.   docusate sodium 100 MG capsule Commonly known as:  COLACE Take 1 capsule (100 mg total) by mouth daily. Start taking on:  11/26/2016   ferrous sulfate 325 (65 FE) MG tablet Take 325 mg by mouth daily with breakfast.   gabapentin 300 MG capsule Commonly known as:  NEURONTIN Take 1 capsule (300 mg total) by mouth at bedtime.   letrozole 2.5 MG tablet Commonly known as:  FEMARA Take 1 tablet (2.5 mg total) by mouth daily.   omeprazole 40 MG capsule Commonly known as:  PRILOSEC Take 1 capsule (40 mg total) by mouth 2 (two) times daily before a meal.   ondansetron 4 MG tablet Commonly known as:  ZOFRAN Take 1 tablet (4 mg total)  by mouth 2 (two) times daily.   oxyCODONE 5 MG immediate release tablet Commonly known as:  Oxy IR/ROXICODONE Take 1 tablet (5 mg total) by mouth every 4 (four) hours as needed for severe pain or breakthrough pain.   Vitamin D (Ergocalciferol) 50000 units Caps capsule Commonly known as:  DRISDOL Take 50,000 Units by mouth every 7 (seven) days.      Follow-up Information    Armandina Gemma, MD. Go on 12/14/2016.   Specialty:  General Surgery Why:  Your appointment is 12/14/16 @ 2:45PM. Please arrive 30 minutes prior to your appointment to check in and fill out necessary paperwork. Contact information: [address] [place] [address] [phone]        Kathyrn Lass, MD Follow up.   Specialty:  Family Medicine Contact information: [place] [address] [phone]            The results of significant diagnostics from this hospitalization (including imaging, microbiology, ancillary and laboratory) are listed below for reference.     Microbiology: Recent Results (from the past 240 hour(s))  MRSA PCR Screening  Status: None   Collection Time: 11/17/16  1:41 PM  Result Value Ref Range Status   MRSA by PCR NEGATIVE NEGATIVE Final    Comment:        The GeneXpert MRSA Assay (FDA approved for NASAL specimens only), is one component of a comprehensive MRSA colonization surveillance program. It is not intended to diagnose MRSA infection nor to guide or monitor treatment for MRSA infections.      Labs: Basic Metabolic Panel:  Recent Labs Lab 11/19/16 0405 11/20/16 0408 11/21/16 0400 11/22/16 0426 11/24/16 0509  NA 138 140 140 137 138  K 4.4 3.8 3.9 3.6 3.6  CL 108 110 109 105 104  CO2 24 24 25 25 27   GLUCOSE 162* 146* 117* 120* 125*  BUN 41* 21* 13 10 7   CREATININE 1.24* 0.70 0.60 0.55 0.60  CALCIUM 7.5* 7.6* 7.6* 7.7* 7.7*  MG 1.9 2.0 2.0 1.9  --   PHOS 2.0* 1.8* 2.2* 2.0*  --    CBC:  Recent Labs Lab  11/19/16 0405 11/20/16 0408 11/21/16 0400 11/22/16 0426 11/24/16 0509 11/25/16 0515  WBC 17.8* 14.6* 10.5 9.5 8.7 8.1  NEUTROABS 13.4* 11.5* 7.4 6.6  --   --   HGB 9.3* 8.8* 8.4* 8.4* 8.1* 8.8*  HCT 28.0* 27.0* 26.2* 25.7* 25.1* 26.8*  MCV 88.9 88.8 89.1 88.6 87.8 88.2  PLT 281 268 251 256 246 270    SIGNED: Time coordinating discharge:  30 minutes  Faye Ramsay, MD  [facility] 11/25/2016, 10:03 AM Pager [phone]  If 7PM-7AM, please contact night-coverage [email or web address] Password [email or web address]

## 2016-11-25 NOTE — Discharge Instructions (Signed)
Hernia A hernia happens when an organ or tissue inside your body pushes out through a weak spot in the belly (abdomen). Follow these instructions at home:  Avoid stretching or overusing (straining) the muscles near the hernia.  Do not lift anything heavier than 10 lb (4.5 kg).  Use the muscles in your leg when you lift something up. Do not use the muscles in your back.  When you cough, try to cough gently.  Eat a diet that has a lot of fiber. Eat lots of fruits and vegetables.  Drink enough fluids to keep your pee (urine) clear or pale yellow. Try to drink 6-8 glasses of water a day.  Take medicines to make your poop soft (stool softeners) as told by your doctor.  Lose weight, if you are overweight.  Do not use any tobacco products, including cigarettes, chewing tobacco, or electronic cigarettes. If you need help quitting, ask your doctor.  Keep all follow-up visits as told by your doctor. This is important. Contact a doctor if:  The skin by the hernia gets puffy (swollen) or red.  The hernia is painful. Get help right away if:  You have a fever.  You have belly pain that is getting worse.  You feel sick to your stomach (nauseous) or you throw up (vomit).  You cannot push the hernia back in place by gently pressing on it while you are lying down.  The hernia:  Changes in shape or size.  Is stuck outside your belly.  Changes color.  Feels hard or tender. This information is not intended to replace advice given to you by your health care provider. Make sure you discuss any questions you have with your health care provider. Document Released: 12/10/2009 Document Revised: 11/28/2015 Document Reviewed: 05/02/2014 Elsevier Interactive Patient Education  2017 [employer].

## 2016-11-25 NOTE — Clinical Social Work Placement (Signed)
Patient received and accepted bed offer at [facility]. [facility] contacted, family aware. Packet complete, patient's RN can call report to [phone].  CLINICAL SOCIAL WORK PLACEMENT  NOTE  Date:  11/25/2016  Patient Details  Name: Brandy [name] MRN: [id number] Date of Birth: 1933-05-21  Clinical Social Work is seeking post-discharge placement for this patient at the [place] level of care (*CSW will initial, date and re-position this form in  chart as items are completed):  Yes   Patient/family provided with [facility] Work Department's list of facilities offering this level of care within the geographic area requested by the patient (or if unable, by the patient's family).  Yes   Patient/family informed of their freedom to choose among providers that offer the needed level of care, that participate in Medicare, Medicaid or managed care program needed by the patient, have an available bed and are willing to accept the patient.  Yes   Patient/family informed of [facility]'s ownership interest in [facility] and [facility], as well as of the fact that they are under no obligation to receive care at these facilities.  PASRR submitted to EDS on 11/23/16     PASRR number received on 11/24/16     Existing PASRR number confirmed on       FL2 transmitted to all facilities in geographic area requested by pt/family on 11/23/16     FL2 transmitted to all facilities within larger geographic area on       Patient informed that his/her managed care company has contracts with or will negotiate with certain facilities, including the following:        Yes   Patient/family informed of bed offers received.  Patient chooses bed at [facility]     Physician recommends and patient chooses bed at      Patient to be transferred to [facility] on 11/25/16.  Patient to be transferred to facility by [facility]     Patient  family notified on 11/25/16 of transfer.  Name of family member notified:  [name] Higgins     PHYSICIAN       Additional Comment:    _______________________________________________ Burnis Medin, LCSW 11/25/2016, 2:31 PM

## 2016-11-25 NOTE — Progress Notes (Signed)
Patient ID: Brandy [name], female   DOB: 02-05-1933, 83 y.o.   MRN: [id number]    Wound healing well. Continue wound vac at SNF with MWF vac changes.  [name] A MILLER

## 2016-11-25 NOTE — Consult Note (Signed)
[facility] Nurse wound follow up Wound type: NPWT dressing change. PA present. Measurement: Wound bed: Drainage (amount, consistency, odor)  Periwound: Dressing procedure/placement/frequency: Wound vac dressing changed prior to discharge. Wound cleansed with NS, patted dry, applied one mepitel, one white foam and one black foam.  Seal immediately achieved, suction at 154mmHG. Pt tolerated well.  Pt to be discharged to facility today.    Fara Olden, RN-C, WTA-C Wound Treatment Associate

## 2016-11-26 ENCOUNTER — Non-Acute Institutional Stay (SKILLED_NURSING_FACILITY): Payer: Medicare HMO | Admitting: Internal Medicine

## 2016-11-26 ENCOUNTER — Encounter: Payer: Self-pay | Admitting: Internal Medicine

## 2016-11-26 DIAGNOSIS — K430 Incisional hernia with obstruction, without gangrene: Secondary | ICD-10-CM | POA: Diagnosis not present

## 2016-11-26 DIAGNOSIS — R739 Hyperglycemia, unspecified: Secondary | ICD-10-CM | POA: Diagnosis not present

## 2016-11-26 DIAGNOSIS — K219 Gastro-esophageal reflux disease without esophagitis: Secondary | ICD-10-CM | POA: Diagnosis not present

## 2016-11-26 DIAGNOSIS — I10 Essential (primary) hypertension: Secondary | ICD-10-CM | POA: Diagnosis not present

## 2016-11-26 DIAGNOSIS — M2550 Pain in unspecified joint: Secondary | ICD-10-CM | POA: Diagnosis not present

## 2016-11-26 DIAGNOSIS — I350 Nonrheumatic aortic (valve) stenosis: Secondary | ICD-10-CM

## 2016-11-26 DIAGNOSIS — D649 Anemia, unspecified: Secondary | ICD-10-CM

## 2016-11-26 DIAGNOSIS — F418 Other specified anxiety disorders: Secondary | ICD-10-CM

## 2016-11-26 DIAGNOSIS — E878 Other disorders of electrolyte and fluid balance, not elsewhere classified: Secondary | ICD-10-CM

## 2016-11-26 DIAGNOSIS — R69 Illness, unspecified: Secondary | ICD-10-CM | POA: Diagnosis not present

## 2016-11-26 NOTE — Progress Notes (Signed)
Patient ID: Brandy [name], female   DOB: January 01, 1933, 83 y.o.   MRN: [id number]    HISTORY AND PHYSICAL   DATE: 11/26/2016  Location:    [place] [facility] Room Number: 120 B Place of Service: SNF (31)   Extended Emergency Contact Information Primary Emergency Contact: Maiolo,John Address: [address]          [place], [place] [address] [place] Phone: [phone] Mobile Phone: [phone] Relation: Spouse Secondary Emergency Contact: Higgins,[name]          [place], [place] [address] [place] Phone: [phone] Mobile Phone: [phone] Relation: Daughter  Advanced Directive information Does Patient Have a Medical Advance Directive?: No, Does patient want to make changes to medical advance directive?: No - Patient declined  Chief Complaint  Patient presents with  . New Admit To SNF    Admission    HPI:  83 yo female seen today as a new admission into SNF following hospital stay for incarcerated incisional hernia, SBO, hyperglycemia, AKI, hx severe protein calorie malnutrition, depression, GERD, hyperlipidemia, HTN, hx breast CA on femara s/p mastectomy/chemotx/XRT (dx in 2013), moderate AS. CT A/P revealed 3 ventral periumbilical hernia with high grade SBO. Nuclear stress test showed nml EF and suspected inferior wall scar. She underwent small bowel repair on 11/17/16 and wound vac applied. Surgical path neg for malignant cells. Albumin 3.9; CBG 120-200s; Cr peaked 2.54-->0.6; WBC 16.5K--> 20K-->8.1K; abs neutrophils peaked 17K-->6.6K; Hgb 13.2-->8.8; Phos dropped 1.8-->2 at d/c. She presents to SNF for short term rehab.  Today she reports pain is 5/10 on scale and controlled with oxycodone. Appetite ok and sleeps well. No constipation. No f/c. She is scheduled to f/u with sx Dr Harlow Asa 12/14/16. She has a wound vac.  HTN - BP stable on amlodipine. 2 D echo in 10/2016 revealed nml EF, grade DD, moderate AS, mod-sev calcified mitral valve  annulus, mildly dilated LA.  hyperlipidemia - no myalgias on lipitor. No recent LDL in EPIC  Aortic stenosis - moderate. 2D echo in 10/2016 revealed valve area 1.37cm^2 with mean gradient of 25 mmHg and peak gradient 43 mmHg.   Asthma - stable. no recent exacerbations. She does not take any medications.  GERD/hx duodenal ulcer/hx hiatal hernia - stable on omeprazole. Constipation stable on colace  Multiple joint pain/arthritis/ L-S spondylosis without myelopathy -  Depression/GAD - mood stable on celexa  Hx breast CA - dx in 2013; s/p mastectomy/chemotx/XRT. She is currently on femara. Followed by oncology  Chronic pain syndrome - stable on gabapentin, tylenol and OxyIR  Vitamin D deficiency - stable on weekly Vitamin D  Past Medical History:  Diagnosis Date  . Anemia   . Arthritis    shoulders  . Asthma    mild, no inhalers used  . Breast cancer ([facility]) 12/18/11   left breast masectomy=metastatic ca in (1/1) lymph node ,invasive ductal ca,2 foci,,dcis,lymph ovascular invasion identified,surgical resection margins neg for ca,additional tissue=benign skin and subcutaneous tissue  . Bronchitis    hx of;last time >3yr ago  . Depression    takes Celexa daily  . Difficult intubation   . Dysphagia   . Full dentures   . Gall stones 2015  . GERD (gastroesophageal reflux disease)    takes Prilosec prn  . H/O hiatal hernia   . History of kidney stones    many yrs ago  . Hx of radiation therapy 04/26/12 -06/10/12   left breast  . Hyperlipidemia  takes Simvastatin daily  . Hypertension    takes Amlodipine and Diovan daily  . Insomnia    takes Ambien nightly  . Urinary urgency     Past Surgical History:  Procedure Laterality Date  . APPENDECTOMY    . bladder tack    . BREAST BIOPSY  1998   left   . DILATION AND CURETTAGE OF UTERUS    . ENDOSCOPIC RETROGRADE CHOLANGIOPANCREATOGRAPHY (ERCP) WITH PROPOFOL N/A 02/01/2014   Procedure: ENDOSCOPIC RETROGRADE  CHOLANGIOPANCREATOGRAPHY (ERCP) WITH PROPOFOL;  Surgeon: [name], MD;  Location: [facility];  Service: Endoscopy;  Laterality: N/A;  . ESOPHAGOGASTRODUODENOSCOPY    . ESOPHAGOGASTRODUODENOSCOPY N/A 06/26/2016   Procedure: ESOPHAGOGASTRODUODENOSCOPY (EGD);  Surgeon: [name], MD;  Location: [facility];  Service: Endoscopy;  Laterality: N/A;  . ESOPHAGOGASTRODUODENOSCOPY (EGD) WITH PROPOFOL  12/19/2013   Procedure: ESOPHAGOGASTRODUODENOSCOPY (EGD) WITH PROPOFOL;  Surgeon: Beryle Beams, MD;  Location: [facility];  Service: Endoscopy;;  . ESOPHAGOGASTRODUODENOSCOPY (EGD) WITH PROPOFOL N/A 09/10/2016   Procedure: ESOPHAGOGASTRODUODENOSCOPY (EGD) WITH PROPOFOL;  Surgeon: [name], MD;  [facility];  Service: Endoscopy;  Laterality: N/A;  . EUS  06/04/2011   Procedure: UPPER ENDOSCOPIC ULTRASOUND (EUS) LINEAR;  Surgeon: Owens Loffler, MD;  [facility];  Service: Endoscopy;  Laterality: N/A;  . EUS N/A 02/01/2014   Procedure: UPPER ENDOSCOPIC ULTRASOUND (EUS) LINEAR;  Surgeon: [name], MD;  [facility];  Service: Endoscopy;  Laterality: N/A;  . EXPLORATORY LAPAROTOMY      biopsy of intra-abdominal mass  . LAPAROTOMY N/A 11/17/2016   Procedure: EXPLORATORY LAPAROTOMY WITH LYSIS OF ADHESIONS, SMALL BOWEL RESECTION, REPAIR OF INCARCERATED VENTRAL INCISIONAL HERNIA, INSERTION OF BIOLOGIC MESH PATCH,APPLICATION OF WOUND VAC DRESSING;  Surgeon: Armandina Gemma, MD;  Location: [facility];  Service: General;  Laterality: N/A;  . MASTECTOMY W/ SENTINEL NODE BIOPSY  12/18/2011   Procedure: MASTECTOMY WITH SENTINEL LYMPH NODE BIOPSY;  Surgeon: Rolm Bookbinder, MD;  Location: [facility];  Service: General;  Laterality: Left;  . PORT-A-CATH REMOVAL Right 06/15/2013   Procedure: REMOVAL PORT-A-CATH;  Surgeon: Rolm Bookbinder, MD;  Location: [facility];  Service: General;  Laterality: Right;  . PORTACATH PLACEMENT  01/27/2012   Procedure: INSERTION  PORT-A-CATH;  Surgeon: Rolm Bookbinder, MD;  Location: [facility];  Service: General;  Laterality: Right;  PORT PLACEMENT  . TOTAL SHOULDER REPLACEMENT  2011   left  . TUBAL LIGATION      Patient Care Team: Kathyrn Lass, MD as PCP - General  Social History   Social History  . Marital status: Married    Spouse name: N/A  . Number of children: N/A  . Years of education: N/A   Occupational History  . retired    Social History Main Topics  . Smoking status: Never Smoker  . Smokeless tobacco: Never Used  . Alcohol use No  . Drug use: No  . Sexual activity: Yes    Birth control/ protection: Surgical     Comment: mensus age 65, 79st pregnancy 6, no hrt gg4,p3, 1 babay lived a few hours complications   Other Topics Concern  . Not on file   Social History Narrative  . No narrative on file     reports that she has never smoked. She has never used smokeless tobacco. She reports that she does not drink alcohol or use drugs.  Family History  Problem Relation Age of Onset  . Prostate cancer Father   . Breast cancer Other  Family Status  Relation Status  . Father Deceased at age 20       prostate ca  . Mother Deceased at age 68       ofTB   . PGM Deceased       cervical ca  . PGF Deceased       throat ca  . Other Alive  . MGM Deceased  . MGF Deceased    Immunization History  Administered Date(s) Administered  . Influenza Split 04/04/2009, 04/22/2010, 05/23/2012, 04/04/2014, 07/09/2015  . Influenza, High Dose Seasonal PF 06/01/2016  . Influenza,inj,Quad PF,36+ Mos 06/08/2013, 07/10/2015  . Pneumococcal Conjugate-13 04/04/2014  . Pneumococcal Polysaccharide-23 09/08/2007    No Known Allergies  Medications: Patient's Medications  New Prescriptions   No medications on file  Previous Medications   ACETAMINOPHEN (TYLENOL) 500 MG TABLET    Take 1,000 mg by mouth every 6 (six) hours as needed (pain).   AMLODIPINE (NORVASC) 10 MG TABLET    Take 10  mg by mouth every morning.    ATORVASTATIN (LIPITOR) 40 MG TABLET    Take 1 tablet by mouth daily.   CITALOPRAM (CELEXA) 40 MG TABLET    Take 1 tablet by mouth daily.   DOCUSATE SODIUM (COLACE) 100 MG CAPSULE    Take 1 capsule (100 mg total) by mouth daily.   FERROUS SULFATE 325 (65 FE) MG TABLET    Take 325 mg by mouth daily with breakfast.   GABAPENTIN (NEURONTIN) 300 MG CAPSULE    Take 1 capsule (300 mg total) by mouth at bedtime.   LETROZOLE (FEMARA) 2.5 MG TABLET    Take 1 tablet (2.5 mg total) by mouth daily.   OMEPRAZOLE (PRILOSEC) 40 MG CAPSULE    Take 40 mg by mouth daily.   ONDANSETRON (ZOFRAN) 4 MG TABLET    Take 1 tablet (4 mg total) by mouth 2 (two) times daily.   OXYCODONE (OXY IR/ROXICODONE) 5 MG IMMEDIATE RELEASE TABLET    Take 1 tablet (5 mg total) by mouth every 4 (four) hours as needed for severe pain or breakthrough pain.   VITAMIN D, ERGOCALCIFEROL, (DRISDOL) 50000 UNITS CAPS CAPSULE    Take 50,000 Units by mouth every 7 (seven) days. Every Friday  Modified Medications   No medications on file  Discontinued Medications   OMEPRAZOLE (PRILOSEC) 40 MG CAPSULE    Take 1 capsule (40 mg total) by mouth 2 (two) times daily before a meal.    Review of Systems  Constitutional: Positive for fatigue.  Gastrointestinal: Positive for abdominal pain.  Musculoskeletal: Positive for arthralgias.  Skin: Positive for wound.  All other systems reviewed and are negative.   Vitals:   11/26/16 0920  BP: 131/86  Pulse: 87  Resp: 13  Temp: 98.7 F (37.1 C)  TempSrc: Oral  SpO2: 90%  Weight: 167 lb 6.4 oz (75.9 kg)  Height: 4\' 11"  (1.499 m)   Body mass index is 33.81 kg/m.  Physical Exam  Constitutional: She is oriented to person, place, and time. She appears well-developed and well-nourished.  Frail appearing in NAD, sitting up in bed  HENT:  Mouth/Throat: Oropharynx is clear and moist. No oropharyngeal exudate.  Eyes: Pupils are equal, round, and reactive to light. No  scleral icterus.  Neck: Neck supple. Carotid bruit is present (b/l systolic from chest). No tracheal deviation present. No thyromegaly present.  Cardiovascular: Normal rate, regular rhythm and intact distal pulses.  Exam reveals no gallop and no friction rub.   Murmur (2/6  SEM --> carotid b/l) heard. Trace LLE edema. No RLE edema. No calf TTP b/l. No cords palpable  Pulmonary/Chest: Effort normal and breath sounds normal. No stridor. No respiratory distress. She has no wheezes. She has no rales. Breasts are asymmetrical (left mastectomy).  Abdominal: Soft. Bowel sounds are normal. She exhibits no distension, no abdominal bruit and no mass. There is no hepatomegaly. There is tenderness. There is no rebound and no guarding. No hernia.    Musculoskeletal: She exhibits edema.  Lymphadenopathy:    She has no cervical adenopathy.  Neurological: She is alert and oriented to person, place, and time.  Skin: Skin is warm and dry. No rash noted.  Wound as stated. Followed by facility wound care  Psychiatric: She has a normal mood and affect. Her behavior is normal. Judgment and thought content normal.     Labs reviewed: Admission on 11/15/2016, Discharged on 11/25/2016  No results displayed because visit has over 200 results.    Admission on 09/10/2016, Discharged on 09/10/2016  Component Date Value Ref Range Status  . WBC 09/10/2016 13.2* 4.0 - 10.5 K/uL Final  . RBC 09/10/2016 3.29* 3.87 - 5.11 MIL/uL Final  . Hemoglobin 09/10/2016 8.9* 12.0 - 15.0 g/dL Final  . HCT 09/10/2016 27.8* 36.0 - 46.0 % Final  . MCV 09/10/2016 84.5  78.0 - 100.0 fL Final  . MCH 09/10/2016 27.1  26.0 - 34.0 pg Final  . MCHC 09/10/2016 32.0  30.0 - 36.0 g/dL Final  . RDW 09/10/2016 17.7* 11.5 - 15.5 % Final  . Platelets 09/10/2016 242  150 - 400 K/uL Final    Ct Abdomen Pelvis Wo Contrast  Result Date: 11/15/2016 CLINICAL DATA:  Generalized abdominal pain and emesis x2 weeks. EXAM: CT ABDOMEN AND PELVIS WITHOUT  CONTRAST TECHNIQUE: Multidetector CT imaging of the abdomen and pelvis was performed following the standard protocol without IV contrast. COMPARISON:  12/17/2013 FINDINGS: Lower chest: Cardiomegaly with aortic atherosclerosis, mitral annular calcifications and coronary arteriosclerosis. No pericardial effusion. There is atelectasis at the lung bases. Stable moderate-sized hiatal hernia. Hepatobiliary: Punctate calcification in the right hepatic dome consistent with the tiny granuloma or vascular calcification. No space-occupying mass of the liver noted. Gallbladder is nondistended. No gallstones are seen. No biliary dilatation is identified. Pancreas: Unremarkable. No pancreatic ductal dilatation or surrounding inflammatory changes.Normal maxilla normal Spleen: Normal in size without focal abnormality. Adrenals/Urinary Tract: Normal bilateral adrenal glands. Stable cortical and parapelvic cysts noted of both kidneys. Hyperdense exophytic lesion off the upper pole the right kidney is stable likely to represent a proteinaceous or hemorrhagic cyst, also stable in size in consistent with a benign finding given stability since 2015. No obstructive uropathy is identified. There are punctate calcifications within both kidneys which may be secondary to vascular calcifications or nephrolithiasis. Stomach/Bowel: Moderate fluid-filled distention of the stomach. Dilated fluid-filled small bowel is seen starting from the second portion of the duodenum leading up to a transition point within a left periumbilical ventral hernia, series 2, image 60. There is one right-sided periumbilical ventral hernia containing nondistended loops of ileum, a fat containing umbilical hernia, and two left-sided periumbilical ventral hernias, the larger of which demonstrates the transition point. The more medial of the left-sided hernias contains omental fat, nondistended ileum and fluid-filled distended jejunum up to 4.1 cm. A short segment of  incarcerated jejunum is not entirely excluded. The more lateral left-sided hernia contains omental fat and nondistended ileum. Vascular/Lymphatic: Aortic atherosclerosis. No enlarged abdominal or pelvic lymph nodes. Reproductive: Atrophic appearance  of the uterus consistent with patient's age. No adnexal mass. Other: No abdominopelvic ascites. Musculoskeletal: Thoracolumbar spondylosis. No acute nor suspicious osseous findings. IMPRESSION: 1. Three ventral, periumbilical hernias are identified containing small bowel, one to the right of the umbilicus and two to the left. The more medial of the left-sided ventral hernias demonstrates a transition point contributing to a high-grade small bowel obstruction. A short segment of fluid-filled dilated small bowel is seen measuring up to 4.1 cm in caliber within the medial left-sided hernia and incarceration is not entirely excluded. Correlate for reducibility. 2. Stable simple and complex cysts as well as punctate bilateral nephrolithiasis of the kidneys. 3. Stable cardiomegaly with mitral annular calcifications and coronary arteriosclerosis. Moderate-sized hiatal hernia. Electronically Signed   By: Ashley Royalty M.D.   On: 11/15/2016 21:19   Nm Myocar Multi W/spect W/wall Motion / Ef  Result Date: 11/17/2016 CLINICAL DATA:  Hypertension, asthma, abdominal pain. Preop for surgery. EXAM: MYOCARDIAL IMAGING WITH SPECT (REST AND PHARMACOLOGIC-STRESS) GATED LEFT VENTRICULAR WALL MOTION STUDY LEFT VENTRICULAR EJECTION FRACTION TECHNIQUE: Standard myocardial SPECT imaging was performed after resting intravenous injection of 10 mCi Tc-23m tetrofosmin. Subsequently, intravenous infusion of Lexiscan was performed under the supervision of the Cardiology staff. At peak effect of the drug, 30 mCi Tc-48m tetrofosmin was injected intravenously and standard myocardial SPECT imaging was performed. Quantitative gated imaging was also performed to evaluate left ventricular wall motion, and  estimate left ventricular ejection fraction. COMPARISON:  None. FINDINGS: Perfusion: Decreased activity in the inferior wall on both sets of images likely due to scar. No reversible defects to suggest ischemia. Wall Motion: Decrease thickening and activity in the inferior wall. The remainder the left ventricle demonstrates normal wall motion with an myocardial thickening with contraction. Left Ventricular Ejection Fraction: 71 % End diastolic volume 66 ml End systolic volume 19 ml IMPRESSION: 1. Suspect inferior wall scar/infarction. No findings for ischemia. 2. Normal left ventricular wall motion except for the inferior wall. 3. Left ventricular ejection fraction 71% 4. Non invasive risk stratification*: Intermediate *2012 Appropriate Use Criteria for Coronary Revascularization Focused Update: J Am Coll Cardiol. 1607;37(1):062-694. http://content.airportbarriers.com.aspx?articleid=[email or web address] Electronically Signed   By: Marijo Sanes M.D.   On: 11/17/2016 12:28   Dg Chest Port 1 View  Result Date: 11/19/2016 CLINICAL DATA:  Respiratory failure. EXAM: PORTABLE CHEST 1 VIEW COMPARISON:  11/17/2016. FINDINGS: Interim extubation. Right IJ line NG tube in stable position. Heart size normal. Low lung volumes with mild basilar atelectasis. No focal infiltrate. No pleural effusion or pneumothorax. Surgical clips are noted over the chest. Left shoulder replacement. IMPRESSION: 1. Interim extubation.  Right IJ line NG tube in stable position. 2. Low lung volumes with mild basilar atelectasis. Electronically Signed   By: Marcello Moores  Register   On: 11/19/2016 06:55   Dg Chest Port 1 View  Result Date: 11/17/2016 CLINICAL DATA:  Central line placement, history of breast cancer, asthma, bronchitis. EXAM: PORTABLE CHEST 1 VIEW COMPARISON:  11/16/2016 FINDINGS: Right IJ central line catheter projects within the mid right atrium. No pneumothorax. Gastric tube extends into the expected location of the stomach with tip in the  gastric antral region. Endotracheal tube tip is approximately 2.8 cm above the carina. Heart is borderline enlarged. Low lung volumes without pneumonic consolidation, CHF or pneumothorax. Left partially visualized shoulder arthroplasty. 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 IMPRESSION: 1. Right IJ central line tip in the right atrium.  No pneumothorax. 2. Slightly low-lying endotracheal tube tip at 2.8 cm  above the carina. 3. Gastric tube is in the expected location of the stomach. 4. No pulmonary disease. Electronically Signed   By: Ashley Royalty M.D.   On: 11/17/2016 20:19   Dg Chest Port 1 View  Result Date: 11/16/2016 CLINICAL DATA:  Shortness of breath. EXAM: PORTABLE CHEST 1 VIEW COMPARISON:  08/21/2015. FINDINGS: Trachea is midline. Heart size stable. Lungs are clear. No pleural fluid. Degenerative changes in the right shoulder. Left shoulder arthroplasty is partially imaged. IMPRESSION: No acute findings. Electronically Signed   By: Lorin Picket M.D.   On: 11/16/2016 12:17   Dg Abd 2 Views  Result Date: 11/16/2016 CLINICAL DATA:  Nausea and vomiting EXAM: ABDOMEN - 2 VIEW COMPARISON:  CT abdomen and pelvis Nov 15, 2016 FINDINGS: Supine and upright images obtained. There is moderate stool throughout the colon. There are loops of dilated small bowel multiple air-fluid levels. No free air. There are minimal pleural effusions bilaterally. IMPRESSION: Bowel gas pattern indicative of a degree of bowel obstruction. No free air evident. Electronically Signed   By: Lowella Grip III M.D.   On: 11/16/2016 08:54   Dg Abd Portable 1v  Result Date: 11/22/2016 CLINICAL DATA:  NG tube placement EXAM: PORTABLE ABDOMEN - 1 VIEW COMPARISON:  11/17/2016 FINDINGS: NG tube tip is in the proximal stomach with the side port in the distal esophagus. Nonobstructive bowel gas pattern. IMPRESSION: NG tube tip in the proximal stomach with the side port in the distal esophagus.  Electronically Signed   By: Rolm Baptise M.D.   On: 11/22/2016 08:01   Dg Abd Portable 1v  Result Date: 11/17/2016 CLINICAL DATA:  Encounter for nasogastric tube placement EXAM: PORTABLE ABDOMEN - 1 VIEW COMPARISON:  11/16/2016 FINDINGS: The tip and side port of a gastric tube are in the expected location of the stomach with tip in the expected location of the distal stomach possibly the gastric antrum. No radio-opaque calculi or other significant radiographic abnormality are seen. A few scattered gas containing small and large bowel loops are noted. No no free air. The tip of a right-sided central line projects over the expected location of the right atrium. IMPRESSION: Gastric tube in the expected location of the stomach. Right-sided central line tip is noted in right atrium. Electronically Signed   By: Ashley Royalty M.D.   On: 11/17/2016 20:21     Assessment/Plan   ICD-10-CM   1. Electrolyte disturbance E87.8   2. Essential hypertension I10   3. Incarcerated incisional hernia s/p SB resection & repair 11/17/2016 K43.0   4. Hyperglycemia R73.9   5. Gastroesophageal reflux disease without esophagitis K21.9   6. Pain in joint, multiple sites M25.50   7. Moderate aortic stenosis I35.0   8. Anemia, unspecified type D64.9   9. Depression with anxiety F41.8     Check BMP, phos, CBC   Follow BPs closely. If trend up, t/c resuming diovan hct +/- spironolactone  F/u with sx as scheduled  Cont current medications as ordered  Nutritional supplements as indicated  PT/OT/ST as ordered  F/u with H/O Dr Lindi Adie in July for annual breast cancer f/u. She started femara in Dec 2013  F/u with cardio as scheduled  GOAL: short term rehab and d/c home when medically appropriate. Communicated with pt and nursing.  Will follow  Dorsie Sethi S. Perlie Gold  [facility] [address] [place], [place] [address] [phone] Cell (Monday-Friday 8 AM - 5  PM) [phone] After 5 PM and follow prompts

## 2016-11-30 LAB — CBC AND DIFFERENTIAL
HCT: 26 % — AB (ref 36–46)
HEMOGLOBIN: 8.7 g/dL — AB (ref 12.0–16.0)
Platelets: 382 10*3/uL (ref 150–399)
WBC: 9.4 10^3/mL

## 2016-11-30 LAB — BASIC METABOLIC PANEL
BUN: 19 mg/dL (ref 4–21)
Creatinine: 0.9 mg/dL (ref 0.5–1.1)
GLUCOSE: 163 mg/dL
POTASSIUM: 4.4 mmol/L (ref 3.4–5.3)
SODIUM: 138 mmol/L (ref 137–147)

## 2016-11-30 LAB — HEPATIC FUNCTION PANEL
ALK PHOS: 98 U/L (ref 25–125)
ALT: 13 U/L (ref 7–35)
AST: 15 U/L (ref 13–35)
BILIRUBIN, TOTAL: 0.4 mg/dL

## 2016-12-01 DIAGNOSIS — L98499 Non-pressure chronic ulcer of skin of other sites with unspecified severity: Secondary | ICD-10-CM | POA: Diagnosis not present

## 2016-12-02 ENCOUNTER — Encounter: Payer: Self-pay | Admitting: Adult Health

## 2016-12-02 ENCOUNTER — Non-Acute Institutional Stay (SKILLED_NURSING_FACILITY): Payer: Medicare HMO | Admitting: Adult Health

## 2016-12-02 DIAGNOSIS — J189 Pneumonia, unspecified organism: Secondary | ICD-10-CM | POA: Diagnosis not present

## 2016-12-02 NOTE — Progress Notes (Signed)
Location:   [facility] Room Number: 120A Place of Service:  SNF (31)   CODE STATUS: dnr  No Known Allergies  Chief Complaint  Patient presents with  . Acute Visit    pneumonia    HPI:  She had a chest x-ray demonstrating left lung base she is presently on doxycycline 100 mg twice daily for a total 10 days. She told me that she is feeling better. She still has a cough but tells me that it is better. There are no reports of fever present.    Past Medical History:  Diagnosis Date  . Anemia   . Arthritis    shoulders  . Asthma    mild, no inhalers used  . Breast cancer ([place]) 12/18/11   left breast masectomy=metastatic ca in (1/1) lymph node ,invasive ductal ca,2 foci,,dcis,lymph ovascular invasion identified,surgical resection margins neg for ca,additional tissue=benign skin and subcutaneous tissue  . Bronchitis    hx of;last time >46yr ago  . Depression    takes Celexa daily  . Difficult intubation   . Dysphagia   . Full dentures   . Gall stones 2015  . GERD (gastroesophageal reflux disease)    takes Prilosec prn  . H/O hiatal hernia   . History of kidney stones    many yrs ago  . Hx of radiation therapy 04/26/12 -06/10/12   left breast  . Hyperlipidemia    takes Simvastatin daily  . Hypertension    takes Amlodipine and Diovan daily  . Insomnia    takes Ambien nightly  . Urinary urgency     Past Surgical History:  Procedure Laterality Date  . APPENDECTOMY    . bladder tack    . BREAST BIOPSY  1998   left   . DILATION AND CURETTAGE OF UTERUS    . ENDOSCOPIC RETROGRADE CHOLANGIOPANCREATOGRAPHY (ERCP) WITH PROPOFOL N/A 02/01/2014   Procedure: ENDOSCOPIC RETROGRADE CHOLANGIOPANCREATOGRAPHY (ERCP) WITH PROPOFOL;  Surgeon: [name], MD;  Location: [facility];  Service: Endoscopy;  Laterality: N/A;  . ESOPHAGOGASTRODUODENOSCOPY    . ESOPHAGOGASTRODUODENOSCOPY N/A 06/26/2016   Procedure: ESOPHAGOGASTRODUODENOSCOPY (EGD);  Surgeon: [name], MD;  Location: [place];  Service: Endoscopy;  Laterality: N/A;  . ESOPHAGOGASTRODUODENOSCOPY (EGD) WITH PROPOFOL  12/19/2013   Procedure: ESOPHAGOGASTRODUODENOSCOPY (EGD) WITH PROPOFOL;  Surgeon: Beryle Beams, MD;  Location: [place];  Service: Endoscopy;;  . ESOPHAGOGASTRODUODENOSCOPY (EGD) WITH PROPOFOL N/A 09/10/2016   Procedure: ESOPHAGOGASTRODUODENOSCOPY (EGD) WITH PROPOFOL;  Surgeon: [name], MD;  Location: [facility];  Service: Endoscopy;  Laterality: N/A;  . EUS  06/04/2011   Procedure: UPPER ENDOSCOPIC ULTRASOUND (EUS) LINEAR;  Surgeon: Owens Loffler, MD;  Location: [facility];  Service: Endoscopy;  Laterality: N/A;  . EUS N/A 02/01/2014   Procedure: UPPER ENDOSCOPIC ULTRASOUND (EUS) LINEAR;  Surgeon: [name], MD;  Location: [facility];  Service: Endoscopy;  Laterality: N/A;  . EXPLORATORY LAPAROTOMY      biopsy of intra-abdominal mass  . LAPAROTOMY N/A 11/17/2016   Procedure: EXPLORATORY LAPAROTOMY WITH LYSIS OF ADHESIONS, SMALL BOWEL RESECTION, REPAIR OF INCARCERATED VENTRAL INCISIONAL HERNIA, INSERTION OF BIOLOGIC MESH PATCH,APPLICATION OF WOUND VAC DRESSING;  Surgeon: Armandina Gemma, MD;  Location: [facility];  Service: General;  Laterality: N/A;  . MASTECTOMY W/ SENTINEL NODE BIOPSY  12/18/2011   Procedure: MASTECTOMY WITH SENTINEL LYMPH NODE BIOPSY;  Surgeon: Rolm Bookbinder, MD;  Location: [place];  Service: General;  Laterality: Left;  . PORT-A-CATH REMOVAL Right 06/15/2013   Procedure: REMOVAL  PORT-A-CATH;  Surgeon: Rolm Bookbinder, MD;  Location: [place];  Service: General;  Laterality: Right;  . PORTACATH PLACEMENT  01/27/2012   Procedure: INSERTION PORT-A-CATH;  Surgeon: Rolm Bookbinder, MD;  Location: [place];  Service: General;  Laterality: Right;  PORT PLACEMENT  . TOTAL SHOULDER REPLACEMENT  2011   left  . TUBAL LIGATION      Social History   Social History  . Marital status: Married    Spouse  name: N/A  . Number of children: N/A  . Years of education: N/A   Occupational History  . retired    Social History Main Topics  . Smoking status: Never Smoker  . Smokeless tobacco: Never Used  . Alcohol use No  . Drug use: No  . Sexual activity: Yes    Birth control/ protection: Surgical     Comment: mensus age 14, 57st pregnancy 6, no hrt gg4,p3, 1 babay lived a few hours complications   Other Topics Concern  . Not on file   Social History Narrative  . No narrative on file   Family History  Problem Relation Age of Onset  . Prostate cancer Father   . Breast cancer Other       VITAL SIGNS BP (!) 112/57   Pulse 69   Temp 99.4 F (37.4 C)   Resp 14   Ht 4\' 11"  (1.499 m)   Wt 167 lb 6.4 oz (75.9 kg)   SpO2 94% Comment: on 2 liter 02  BMI 33.81 kg/m   Patient's Medications  New Prescriptions   No medications on file  Previous Medications   ACETAMINOPHEN (TYLENOL) 500 MG TABLET    Take 1,000 mg by mouth every 6 (six) hours as needed (pain).   AMLODIPINE (NORVASC) 10 MG TABLET    Take 10 mg by mouth every morning.    ATORVASTATIN (LIPITOR) 40 MG TABLET    Take 1 tablet by mouth daily.   CITALOPRAM (CELEXA) 40 MG TABLET    Take 1 tablet by mouth daily.   DOCUSATE SODIUM (COLACE) 100 MG CAPSULE    Take 1 capsule (100 mg total) by mouth daily.   DOXYCYCLINE (VIBRAMYCIN) 100 MG CAPSULE    Take 100 mg by mouth. Take one capsule twice daily for pneumonia for 10 days   FERROUS SULFATE 325 (65 FE) MG TABLET    Take 325 mg by mouth daily with breakfast.   GABAPENTIN (NEURONTIN) 300 MG CAPSULE    Take 1 capsule (300 mg total) by mouth at bedtime.   LETROZOLE (FEMARA) 2.5 MG TABLET    Take 1 tablet (2.5 mg total) by mouth daily.   OMEPRAZOLE (PRILOSEC) 40 MG CAPSULE    Take 40 mg by mouth daily.   ONDANSETRON (ZOFRAN) 4 MG TABLET    Take 1 tablet (4 mg total) by mouth 2 (two) times daily.   OXYCODONE (OXY IR/ROXICODONE) 5 MG IMMEDIATE RELEASE TABLET    Take 1 tablet (5 mg  total) by mouth every 4 (four) hours as needed for severe pain or breakthrough pain.   SACCHAROMYCES BOULARDII (FLORASTOR PO)    Take by mouth. Give one capsule two times a day for Prophylaxis   VITAMIN D, ERGOCALCIFEROL, (DRISDOL) 50000 UNITS CAPS CAPSULE    Take 50,000 Units by mouth every 7 (seven) days. Every Friday  Modified Medications   No medications on file  Discontinued Medications   No medications on file     SIGNIFICANT DIAGNOSTIC EXAMS  11-19-16:  chest x-ray: 1. Interim extubation.  Right IJ line NG tube in stable position. 2. Low lung volumes with mild basilar atelectasis.  11-30-16: chest x-ray: minimal consolidation left lung base medially compatible with pneumonia. Probable chronic left tear left rotator cuff   LABS REVIEWED:   11-24-16: wbc 8.7; hgb 8.1; hct 25.1  mcv 87.8; plt 246; glucose 125; bun 7; creat 0.60; k+ 3.6; na++ 138; ca 7.7 11-26-16: glucose 136; bun 10.5; creat 0.63; k+ 3.6; na++ 139; ca 8.1 11-30-16: wbc 9.4; hgb 8.7; hct 26.3; mcv 87.9; plt 382; glucose 163; bun 19.0; creat 0.89; k+ 4.4 ;na++ 138; ca 8.4; liver normal albumin 2.6    Review of Systems  Constitutional: Negative for malaise/fatigue.  Respiratory: Positive for cough. Negative for shortness of breath.   Cardiovascular: Negative for chest pain and leg swelling.  Gastrointestinal: Negative for abdominal pain and constipation.  Musculoskeletal: Negative for back pain, joint pain and myalgias.  Skin: Negative.   Neurological: Negative for dizziness.  Psychiatric/Behavioral: The patient is not nervous/anxious.      Physical Exam  Constitutional: No distress.  Eyes: Conjunctivae are normal.  Neck: Neck supple. No JVD present. No thyromegaly present.  Cardiovascular: Normal rate, regular rhythm and intact distal pulses.   Respiratory: Effort normal. No respiratory distress. She has no wheezes.  Diminished breath sounds   GI: Soft. Bowel sounds are normal. She exhibits no distension.  There is no tenderness.  Musculoskeletal: She exhibits no edema.  Able to move all extremities   Lymphadenopathy:    She has no cervical adenopathy.  Neurological: She is alert.  Skin: Skin is warm and dry. She is not diaphoretic.  Psychiatric: She has a normal mood and affect.     ASSESSMENT/ PLAN:  1. Pneumonia: will have complete the doxycycline will continue to monitor her status.   MD is aware of resident's narcotic use and is in agreement with current plan of care. We will attempt to wean resident as apropriate   Ok Edwards NP [facility]  Contact [phone] Monday through Friday 8am- 5pm  After hours call [phone]

## 2016-12-04 ENCOUNTER — Other Ambulatory Visit: Payer: Self-pay

## 2016-12-04 DIAGNOSIS — C50412 Malignant neoplasm of upper-outer quadrant of left female breast: Secondary | ICD-10-CM

## 2016-12-04 MED ORDER — LETROZOLE 2.5 MG PO TABS: 2.5000 mg | ORAL_TABLET | Freq: Every day | ORAL | 3 refills | Status: DC

## 2016-12-07 ENCOUNTER — Encounter: Payer: Self-pay | Admitting: Internal Medicine

## 2016-12-07 ENCOUNTER — Non-Acute Institutional Stay (SKILLED_NURSING_FACILITY): Payer: Medicare HMO | Admitting: Internal Medicine

## 2016-12-07 DIAGNOSIS — R600 Localized edema: Secondary | ICD-10-CM | POA: Diagnosis not present

## 2016-12-07 DIAGNOSIS — I350 Nonrheumatic aortic (valve) stenosis: Secondary | ICD-10-CM

## 2016-12-07 DIAGNOSIS — I10 Essential (primary) hypertension: Secondary | ICD-10-CM

## 2016-12-07 NOTE — Progress Notes (Signed)
Patient ID: JAKYRAH HOLLADAY, female   DOB: [birth year], 83 y.o.   MRN: [id number]    DATE:  12/07/2016  Location:    [facility] Room Number: 120 A Place of Service: SNF (31)   Extended Emergency Contact Information Primary Emergency Contact: Effertz,John Address: [address]          [place], [place] [address] [place] Phone: [phone] Mobile Phone: [phone] Relation: Spouse Secondary Emergency Contact: Higgins,[name]          [place], [place] [address] [place] Phone: [phone] Mobile Phone: [phone] Relation: Daughter  Advanced Directive information Does Patient Have a Medical Advance Directive?: Yes, Type of Advance Directive: Out of facility DNR (pink MOST or yellow form), Pre-existing out of facility DNR order (yellow form or pink MOST form): Yellow form placed in chart (order not valid for inpatient use), Does patient want to make changes to medical advance directive?: No - Patient declined  Chief Complaint  Patient presents with  . Acute Visit    Lower extremity swelling    HPI:  83 yo female seen today for leg swelling that was noticed last night by her daughter. No new SOB, CP. No change is bowel/bladder habits. No f/c. Her spouse is at bedside. She was recently d/c'd from hospital with incarcerated incisional hernia, SBO, hyperglycemia, AKI, hx severe protein calorie malnutrition, depression, GERD, hyperlipidemia, HTN, hx breast CA on femara s/p mastectomy/chemotx/XRT (dx in 2013), moderate AS. She is taking all meds as ordered. She is taking doxy for HCAP noted on CXR at SNF. Labs reviewed - last Albumin 3.9; Cr 0.9; nml LFTs; Hgb 8.7.    HTN - BP stable on amlodipine. 2 D echo in 10/2016 revealed nml EF, grade DD, moderate AS, mod-sev calcified mitral valve annulus, mildly dilated LA.  Aortic stenosis - moderate. 2D echo in 10/2016 revealed valve area 1.37cm^2 with mean gradient of 25 mmHg and peak gradient 43 mmHg.     Asthma - stable. no recent exacerbations. She does not take any medications.  Hx breast CA - dx in 2013; s/p mastectomy/chemotx/XRT. She is currently on femara. Followed by oncology  Chronic pain syndrome - stable on gabapentin, tylenol and OxyIR    Past Medical History:  Diagnosis Date  . Anemia   . Arthritis    shoulders  . Asthma    mild, no inhalers used  . Breast cancer ([place]) 12/18/11   left breast masectomy=metastatic ca in (1/1) lymph node ,invasive ductal ca,2 foci,,dcis,lymph ovascular invasion identified,surgical resection margins neg for ca,additional tissue=benign skin and subcutaneous tissue  . Bronchitis    hx of;last time >67yr ago  . Depression    takes Celexa daily  . Difficult intubation   . Dysphagia   . Full dentures   . Gall stones 2015  . GERD (gastroesophageal reflux disease)    takes Prilosec prn  . H/O hiatal hernia   . History of kidney stones    many yrs ago  . Hx of radiation therapy 04/26/12 -06/10/12   left breast  . Hyperlipidemia    takes Simvastatin daily  . Hypertension    takes Amlodipine and Diovan daily  . Insomnia    takes Ambien nightly  . Urinary urgency     Past Surgical History:  Procedure Laterality Date  . APPENDECTOMY    . bladder tack    . BREAST BIOPSY  1998   left   . DILATION AND CURETTAGE OF UTERUS    .  ENDOSCOPIC RETROGRADE CHOLANGIOPANCREATOGRAPHY (ERCP) WITH PROPOFOL N/A 02/01/2014   Procedure: ENDOSCOPIC RETROGRADE CHOLANGIOPANCREATOGRAPHY (ERCP) WITH PROPOFOL;  Surgeon: [name], MD;  Location: [facility];  Service: Endoscopy;  Laterality: N/A;  . ESOPHAGOGASTRODUODENOSCOPY    . ESOPHAGOGASTRODUODENOSCOPY N/A 06/26/2016   Procedure: ESOPHAGOGASTRODUODENOSCOPY (EGD);  Surgeon: [name], MD;  Location: [facility];  Service: Endoscopy;  Laterality: N/A;  . ESOPHAGOGASTRODUODENOSCOPY (EGD) WITH PROPOFOL  12/19/2013   Procedure: ESOPHAGOGASTRODUODENOSCOPY (EGD) WITH PROPOFOL;  Surgeon: Beryle Beams, MD;  Location: [facility];  Service: Endoscopy;;  . ESOPHAGOGASTRODUODENOSCOPY (EGD) WITH PROPOFOL N/A 09/10/2016   Procedure: ESOPHAGOGASTRODUODENOSCOPY (EGD) WITH PROPOFOL;  Surgeon: [name], MD;  [facility];  Service: Endoscopy;  Laterality: N/A;  . EUS  06/04/2011   Procedure: UPPER ENDOSCOPIC ULTRASOUND (EUS) LINEAR;  Surgeon: Owens Loffler, MD;  Location[facility];  Service: Endoscopy;  Laterality: N/A;  . EUS N/A 02/01/2014   Procedure: UPPER ENDOSCOPIC ULTRASOUND (EUS) LINEAR;  Surgeon: [name], MD;  [facility];  Service: Endoscopy;  Laterality: N/A;  . EXPLORATORY LAPAROTOMY      biopsy of intra-abdominal mass  . LAPAROTOMY N/A 11/17/2016   Procedure: EXPLORATORY LAPAROTOMY WITH LYSIS OF ADHESIONS, SMALL BOWEL RESECTION, REPAIR OF INCARCERATED VENTRAL INCISIONAL HERNIA, INSERTION OF BIOLOGIC MESH PATCH,APPLICATION OF WOUND VAC DRESSING;  Surgeon: Armandina Gemma, MD;  Location: [facility];  Service: General;  Laterality: N/A;  . MASTECTOMY W/ SENTINEL NODE BIOPSY  12/18/2011   Procedure: MASTECTOMY WITH SENTINEL LYMPH NODE BIOPSY;  Surgeon: Rolm Bookbinder, MD;  Location: [facility];  Service: General;  Laterality: Left;  . PORT-A-CATH REMOVAL Right 06/15/2013   Procedure: REMOVAL PORT-A-CATH;  Surgeon: Rolm Bookbinder, MD;  Location: [facility];  Service: General;  Laterality: Right;  . PORTACATH PLACEMENT  01/27/2012   Procedure: INSERTION PORT-A-CATH;  Surgeon: Rolm Bookbinder, MD;  Location: [facility];  Service: General;  Laterality: Right;  PORT PLACEMENT  . TOTAL SHOULDER REPLACEMENT  2011   left  . TUBAL LIGATION      Patient Care Team: Kathyrn Lass, MD as PCP - General  Social History   Social History  . Marital status: Married    Spouse name: N/A  . Number of children: N/A  . Years of education: N/A   Occupational History  . retired    Social History Main Topics  . Smoking status: Never  Smoker  . Smokeless tobacco: Never Used  . Alcohol use No  . Drug use: No  . Sexual activity: Yes    Birth control/ protection: Surgical     Comment: mensus age 72, 56st pregnancy 19, no hrt gg4,p3, 1 babay lived a few hours complications   Other Topics Concern  . Not on file   Social History Narrative  . No narrative on file     reports that she has never smoked. She has never used smokeless tobacco. She reports that she does not drink alcohol or use drugs.  Family History  Problem Relation Age of Onset  . Prostate cancer Father   . Breast cancer Other    Family Status  Relation Status  . Father Deceased at age 42       prostate ca  . Mother Deceased at age 88       ofTB   . PGM Deceased       cervical ca  . PGF Deceased       throat ca  . Other Alive  . MGM Deceased  .  MGF Deceased    Immunization History  Administered Date(s) Administered  . Influenza Split 04/04/2009, 04/22/2010, 05/23/2012, 04/04/2014, 07/09/2015  . Influenza, High Dose Seasonal PF 06/01/2016  . Influenza,inj,Quad PF,36+ Mos 06/08/2013, 07/10/2015  . PPD Test 11/26/2016  . Pneumococcal Conjugate-13 04/04/2014  . Pneumococcal Polysaccharide-23 09/08/2007    No Known Allergies  Medications: Patient's Medications  New Prescriptions   No medications on file  Previous Medications   ACETAMINOPHEN (TYLENOL) 500 MG TABLET    Take 1,000 mg by mouth every 6 (six) hours as needed (pain).   AMINO ACIDS-PROTEIN HYDROLYS (FEEDING SUPPLEMENT, PRO-STAT SUGAR FREE 64,) LIQD    Take 30 mLs by mouth 2 (two) times daily.   AMLODIPINE (NORVASC) 10 MG TABLET    Take 10 mg by mouth every morning.    ATORVASTATIN (LIPITOR) 40 MG TABLET    Take 1 tablet by mouth daily.   CITALOPRAM (CELEXA) 40 MG TABLET    Take 1 tablet by mouth daily.   DOCUSATE SODIUM (COLACE) 100 MG CAPSULE    Take 1 capsule (100 mg total) by mouth daily.   DOXYCYCLINE (VIBRAMYCIN) 100 MG CAPSULE    Take 100 mg by mouth. Take one capsule  twice daily for pneumonia for 10 days   FERROUS SULFATE 325 (65 FE) MG TABLET    Take 325 mg by mouth daily with breakfast.   GABAPENTIN (NEURONTIN) 300 MG CAPSULE    Take 1 capsule (300 mg total) by mouth at bedtime.   LETROZOLE (FEMARA) 2.5 MG TABLET    Take 1 tablet (2.5 mg total) by mouth daily.   OMEPRAZOLE (PRILOSEC) 40 MG CAPSULE    Take 40 mg by mouth daily.   ONDANSETRON (ZOFRAN) 4 MG TABLET    Take 1 tablet (4 mg total) by mouth 2 (two) times daily.   OXYCODONE (OXY IR/ROXICODONE) 5 MG IMMEDIATE RELEASE TABLET    Take 1 tablet (5 mg total) by mouth every 4 (four) hours as needed for severe pain or breakthrough pain.   SACCHAROMYCES BOULARDII (FLORASTOR PO)    Take by mouth. Give one capsule two times a day for Prophylaxis   VITAMIN D, ERGOCALCIFEROL, (DRISDOL) 50000 UNITS CAPS CAPSULE    Take 50,000 Units by mouth every 7 (seven) days. Every Friday  Modified Medications   No medications on file  Discontinued Medications   No medications on file    Review of Systems  Cardiovascular: Positive for leg swelling.  Musculoskeletal: Positive for arthralgias.  All other systems reviewed and are negative.   Vitals:   12/07/16 1259  BP: 132/78  Pulse: 78  Resp: 18  Temp: 98.4 F (36.9 C)  TempSrc: Oral  SpO2: 94%  Weight: 167 lb 6.4 oz (75.9 kg)  Height: 4\' 11"  (1.499 m)   Body mass index is 33.81 kg/m.  Physical Exam  Constitutional: She is oriented to person, place, and time. She appears well-developed and well-nourished.  Sitting in chair at bedside in NAD, frail appearing, [place] O2 intact  HENT:  Mouth/Throat: Oropharynx is clear and moist. No oropharyngeal exudate.  MM dry; no oral thrush  Eyes: Pupils are equal, round, and reactive to light. No scleral icterus.  Neck: Neck supple. Carotid bruit is present (b/l systolic from chest). No tracheal deviation present. No thyromegaly present.  Cardiovascular: Normal rate, regular rhythm and intact distal pulses.   Occasional  extrasystoles are present. Exam reveals no gallop and no friction rub.   Murmur (2/6 SEM) heard. +1 pitting LE edema b/l. No  calf TTP  Pulmonary/Chest: Effort normal. No stridor. No respiratory distress. She has no wheezes. She has rales (right basilar inspiratory).  Abdominal: Soft. Bowel sounds are normal. She exhibits no distension, no abdominal bruit and no mass. There is no hepatomegaly. There is tenderness. There is no rebound and no guarding. No hernia.    Musculoskeletal: She exhibits edema.  Lymphadenopathy:    She has no cervical adenopathy.  Neurological: She is alert and oriented to person, place, and time.  Skin: Skin is warm and dry. No rash noted.  Wound as stated. Followed by facility wound care  Psychiatric: She has a normal mood and affect. Her behavior is normal. Judgment and thought content normal.     Labs reviewed: Nursing Home on 12/07/2016  Component Date Value Ref Range Status  . Hemoglobin 11/30/2016 8.7* 12.0 - 16.0 g/dL Final  . HCT 11/30/2016 26* 36 - 46 % Final  . Platelets 11/30/2016 382  150 - 399 K/L Final  . WBC 11/30/2016 9.4  10^3/mL Final  . Glucose 11/30/2016 163  mg/dL Final  . BUN 11/30/2016 19  4 - 21 mg/dL Final  . Creatinine 11/30/2016 0.9  0.5 - 1.1 mg/dL Final  . Potassium 11/30/2016 4.4  3.4 - 5.3 mmol/L Final  . Sodium 11/30/2016 138  137 - 147 mmol/L Final  . Alkaline Phosphatase 11/30/2016 98  25 - 125 U/L Final  . ALT 11/30/2016 13  7 - 35 U/L Final  . AST 11/30/2016 15  13 - 35 U/L Final  . Bilirubin, Total 11/30/2016 0.4  mg/dL Final  Admission on 11/15/2016, Discharged on 11/25/2016  No results displayed because visit has over 200 results.    Admission on 09/10/2016, Discharged on 09/10/2016  Component Date Value Ref Range Status  . WBC 09/10/2016 13.2* 4.0 - 10.5 K/uL Final  . RBC 09/10/2016 3.29* 3.87 - 5.11 MIL/uL Final  . Hemoglobin 09/10/2016 8.9* 12.0 - 15.0 g/dL Final  . HCT 09/10/2016 27.8* 36.0 - 46.0 % Final  .  MCV 09/10/2016 84.5  78.0 - 100.0 fL Final  . MCH 09/10/2016 27.1  26.0 - 34.0 pg Final  . MCHC 09/10/2016 32.0  30.0 - 36.0 g/dL Final  . RDW 09/10/2016 17.7* 11.5 - 15.5 % Final  . Platelets 09/10/2016 242  150 - 400 K/uL Final    Ct Abdomen Pelvis Wo Contrast  Result Date: 11/15/2016 CLINICAL DATA:  Generalized abdominal pain and emesis x2 weeks. EXAM: CT ABDOMEN AND PELVIS WITHOUT CONTRAST TECHNIQUE: Multidetector CT imaging of the abdomen and pelvis was performed following the standard protocol without IV contrast. COMPARISON:  12/17/2013 FINDINGS: Lower chest: Cardiomegaly with aortic atherosclerosis, mitral annular calcifications and coronary arteriosclerosis. No pericardial effusion. There is atelectasis at the lung bases. Stable moderate-sized hiatal hernia. Hepatobiliary: Punctate calcification in the right hepatic dome consistent with the tiny granuloma or vascular calcification. No space-occupying mass of the liver noted. Gallbladder is nondistended. No gallstones are seen. No biliary dilatation is identified. Pancreas: Unremarkable. No pancreatic ductal dilatation or surrounding inflammatory changes.Normal maxilla normal Spleen: Normal in size without focal abnormality. Adrenals/Urinary Tract: Normal bilateral adrenal glands. Stable cortical and parapelvic cysts noted of both kidneys. Hyperdense exophytic lesion off the upper pole the right kidney is stable likely to represent a proteinaceous or hemorrhagic cyst, also stable in size in consistent with a benign finding given stability since 2015. No obstructive uropathy is identified. There are punctate calcifications within both kidneys which may be secondary to vascular calcifications or nephrolithiasis.  Stomach/Bowel: Moderate fluid-filled distention of the stomach. Dilated fluid-filled small bowel is seen starting from the second portion of the duodenum leading up to a transition point within a left periumbilical ventral hernia, series 2,  image 60. There is one right-sided periumbilical ventral hernia containing nondistended loops of ileum, a fat containing umbilical hernia, and two left-sided periumbilical ventral hernias, the larger of which demonstrates the transition point. The more medial of the left-sided hernias contains omental fat, nondistended ileum and fluid-filled distended jejunum up to 4.1 cm. A short segment of incarcerated jejunum is not entirely excluded. The more lateral left-sided hernia contains omental fat and nondistended ileum. Vascular/Lymphatic: Aortic atherosclerosis. No enlarged abdominal or pelvic lymph nodes. Reproductive: Atrophic appearance of the uterus consistent with patient's age. No adnexal mass. Other: No abdominopelvic ascites. Musculoskeletal: Thoracolumbar spondylosis. No acute nor suspicious osseous findings. IMPRESSION: 1. Three ventral, periumbilical hernias are identified containing small bowel, one to the right of the umbilicus and two to the left. The more medial of the left-sided ventral hernias demonstrates a transition point contributing to a high-grade small bowel obstruction. A short segment of fluid-filled dilated small bowel is seen measuring up to 4.1 cm in caliber within the medial left-sided hernia and incarceration is not entirely excluded. Correlate for reducibility. 2. Stable simple and complex cysts as well as punctate bilateral nephrolithiasis of the kidneys. 3. Stable cardiomegaly with mitral annular calcifications and coronary arteriosclerosis. Moderate-sized hiatal hernia. Electronically Signed   By: Ashley Royalty M.D.   On: 11/15/2016 21:19   Nm Myocar Multi W/spect W/wall Motion / Ef  Result Date: 11/17/2016 CLINICAL DATA:  Hypertension, asthma, abdominal pain. Preop for surgery. EXAM: MYOCARDIAL IMAGING WITH SPECT (REST AND PHARMACOLOGIC-STRESS) GATED LEFT VENTRICULAR WALL MOTION STUDY LEFT VENTRICULAR EJECTION FRACTION TECHNIQUE: Standard myocardial SPECT imaging was performed  after resting intravenous injection of 10 mCi Tc-56m tetrofosmin. Subsequently, intravenous infusion of Lexiscan was performed under the supervision of the Cardiology staff. At peak effect of the drug, 30 mCi Tc-18m tetrofosmin was injected intravenously and standard myocardial SPECT imaging was performed. Quantitative gated imaging was also performed to evaluate left ventricular wall motion, and estimate left ventricular ejection fraction. COMPARISON:  None. FINDINGS: Perfusion: Decreased activity in the inferior wall on both sets of images likely due to scar. No reversible defects to suggest ischemia. Wall Motion: Decrease thickening and activity in the inferior wall. The remainder the left ventricle demonstrates normal wall motion with an myocardial thickening with contraction. Left Ventricular Ejection Fraction: 71 % End diastolic volume 66 ml End systolic volume 19 ml IMPRESSION: 1. Suspect inferior wall scar/infarction. No findings for ischemia. 2. Normal left ventricular wall motion except for the inferior wall. 3. Left ventricular ejection fraction 71% 4. Non invasive risk stratification*: Intermediate *2012 Appropriate Use Criteria for Coronary Revascularization Focused Update: J Am Coll Cardiol. 7494;49(6):759-163. http://content.airportbarriers.com.aspx?articleid=[email or web address] Electronically Signed   By: Marijo Sanes M.D.   On: 11/17/2016 12:28   Dg Chest Port 1 View  Result Date: 11/19/2016 CLINICAL DATA:  Respiratory failure. EXAM: PORTABLE CHEST 1 VIEW COMPARISON:  11/17/2016. FINDINGS: Interim extubation. Right IJ line NG tube in stable position. Heart size normal. Low lung volumes with mild basilar atelectasis. No focal infiltrate. No pleural effusion or pneumothorax. Surgical clips are noted over the chest. Left shoulder replacement. IMPRESSION: 1. Interim extubation.  Right IJ line NG tube in stable position. 2. Low lung volumes with mild basilar atelectasis. Electronically Signed   By: Marcello Moores   Register   On: 11/19/2016 06:55  Dg Chest Port 1 View  Result Date: 11/17/2016 CLINICAL DATA:  Central line placement, history of breast cancer, asthma, bronchitis. EXAM: PORTABLE CHEST 1 VIEW COMPARISON:  11/16/2016 FINDINGS: Right IJ central line catheter projects within the mid right atrium. No pneumothorax. Gastric tube extends into the expected location of the stomach with tip in the gastric antral region. Endotracheal tube tip is approximately 2.8 cm above the carina. Heart is borderline enlarged. Low lung volumes without pneumonic consolidation, CHF or pneumothorax. Left partially visualized shoulder arthroplasty. 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 IMPRESSION: 1. Right IJ central line tip in the right atrium.  No pneumothorax. 2. Slightly low-lying endotracheal tube tip at 2.8 cm above the carina. 3. Gastric tube is in the expected location of the stomach. 4. No pulmonary disease. Electronically Signed   By: Ashley Royalty M.D.   On: 11/17/2016 20:19   Dg Chest Port 1 View  Result Date: 11/16/2016 CLINICAL DATA:  Shortness of breath. EXAM: PORTABLE CHEST 1 VIEW COMPARISON:  08/21/2015. FINDINGS: Trachea is midline. Heart size stable. Lungs are clear. No pleural fluid. Degenerative changes in the right shoulder. Left shoulder arthroplasty is partially imaged. IMPRESSION: No acute findings. Electronically Signed   By: Lorin Picket M.D.   On: 11/16/2016 12:17   Dg Abd 2 Views  Result Date: 11/16/2016 CLINICAL DATA:  Nausea and vomiting EXAM: ABDOMEN - 2 VIEW COMPARISON:  CT abdomen and pelvis Nov 15, 2016 FINDINGS: Supine and upright images obtained. There is moderate stool throughout the colon. There are loops of dilated small bowel multiple air-fluid levels. No free air. There are minimal pleural effusions bilaterally. IMPRESSION: Bowel gas pattern indicative of a degree of bowel obstruction. No free air evident. Electronically Signed   By: Lowella Grip  III M.D.   On: 11/16/2016 08:54   Dg Abd Portable 1v  Result Date: 11/22/2016 CLINICAL DATA:  NG tube placement EXAM: PORTABLE ABDOMEN - 1 VIEW COMPARISON:  11/17/2016 FINDINGS: NG tube tip is in the proximal stomach with the side port in the distal esophagus. Nonobstructive bowel gas pattern. IMPRESSION: NG tube tip in the proximal stomach with the side port in the distal esophagus. Electronically Signed   By: Rolm Baptise M.D.   On: 11/22/2016 08:01   Dg Abd Portable 1v  Result Date: 11/17/2016 CLINICAL DATA:  Encounter for nasogastric tube placement EXAM: PORTABLE ABDOMEN - 1 VIEW COMPARISON:  11/16/2016 FINDINGS: The tip and side port of a gastric tube are in the expected location of the stomach with tip in the expected location of the distal stomach possibly the gastric antrum. No radio-opaque calculi or other significant radiographic abnormality are seen. A few scattered gas containing small and large bowel loops are noted. No no free air. The tip of a right-sided central line projects over the expected location of the right atrium. IMPRESSION: Gastric tube in the expected location of the stomach. Right-sided central line tip is noted in right atrium. Electronically Signed   By: Ashley Royalty M.D.   On: 11/17/2016 20:21     Assessment/Plan   ICD-10-CM   1. Bilateral leg edema - NEW with associated rales on exam R60.0   2. Essential hypertension - stable I10   3. Moderate aortic stenosis I35.0     Resume spironolactone 25mg  daily  BMP in 3 days  Daily weight and record  T/c CXR if no improvement of sx's in next 24-48 hrs  Keep legs elevated when seated  Cont  other meds as ordered  F/u with cardio as scheduled  Will follow  Jayleena Stille S. Perlie Gold  [facility] [address] [place], [place] [address] [phone] Cell (Monday-Friday 8 AM - 5 PM) [phone] After 5 PM and follow prompts

## 2016-12-10 LAB — BASIC METABOLIC PANEL
BUN: 14 (ref 4–21)
Creatinine: 0.8 (ref 0.5–1.1)
Glucose: 99
Potassium: 4.5 (ref 3.4–5.3)
Sodium: 139 (ref 137–147)

## 2016-12-11 ENCOUNTER — Non-Acute Institutional Stay (SKILLED_NURSING_FACILITY): Payer: Medicare HMO | Admitting: Adult Health

## 2016-12-11 ENCOUNTER — Encounter: Payer: Self-pay | Admitting: Adult Health

## 2016-12-11 DIAGNOSIS — K253 Acute gastric ulcer without hemorrhage or perforation: Secondary | ICD-10-CM | POA: Diagnosis not present

## 2016-12-11 DIAGNOSIS — J189 Pneumonia, unspecified organism: Secondary | ICD-10-CM | POA: Insufficient documentation

## 2016-12-11 DIAGNOSIS — J4541 Moderate persistent asthma with (acute) exacerbation: Secondary | ICD-10-CM | POA: Diagnosis not present

## 2016-12-11 NOTE — Progress Notes (Signed)
Location:   [place] Room Number: 120 A Place of Service:  SNF (31)    CODE STATUS: DNR  No Known Allergies  Chief Complaint  Patient presents with  . Discharge Note    Discharging to Home    HPI:  She is being discharged to home with home health for pt/ot/rn/cna. She will need long term 02 therapy at 2 liters/min. Her 02 sat on room air with exertion is 89%. She will not need any other dme. She will need her prescriptions written and will need to follow up with her medical provider.  She had been hospitalized for incarcerated hernia. She was admitted to this facility for short term rehab.   Past Medical History:  Diagnosis Date  . Anemia   . Arthritis    shoulders  . Asthma    mild, no inhalers used  . Breast cancer ([place]) 12/18/11   left breast masectomy=metastatic ca in (1/1) lymph node ,invasive ductal ca,2 foci,,dcis,lymph ovascular invasion identified,surgical resection margins neg for ca,additional tissue=benign skin and subcutaneous tissue  . Bronchitis    hx of;last time >46yr ago  . Depression    takes Celexa daily  . Difficult intubation   . Dysphagia   . Full dentures   . Gall stones 2015  . GERD (gastroesophageal reflux disease)    takes Prilosec prn  . H/O hiatal hernia   . History of kidney stones    many yrs ago  . Hx of radiation therapy 04/26/12 -06/10/12   left breast  . Hyperlipidemia    takes Simvastatin daily  . Hypertension    takes Amlodipine and Diovan daily  . Insomnia    takes Ambien nightly  . Urinary urgency     Past Surgical History:  Procedure Laterality Date  . APPENDECTOMY    . bladder tack    . BREAST BIOPSY  1998   left   . DILATION AND CURETTAGE OF UTERUS    . ENDOSCOPIC RETROGRADE CHOLANGIOPANCREATOGRAPHY (ERCP) WITH PROPOFOL N/A 02/01/2014   Procedure: ENDOSCOPIC RETROGRADE CHOLANGIOPANCREATOGRAPHY (ERCP) WITH PROPOFOL;  Surgeon: [name], MD;  Location: [facility];  Service: Endoscopy;   Laterality: N/A;  . ESOPHAGOGASTRODUODENOSCOPY    . ESOPHAGOGASTRODUODENOSCOPY N/A 06/26/2016   Procedure: ESOPHAGOGASTRODUODENOSCOPY (EGD);  Surgeon: [name], MD;  Location: [facility];  Service: Endoscopy;  Laterality: N/A;  . ESOPHAGOGASTRODUODENOSCOPY (EGD) WITH PROPOFOL  12/19/2013   Procedure: ESOPHAGOGASTRODUODENOSCOPY (EGD) WITH PROPOFOL;  Surgeon: Beryle Beams, MD;  Location: [place];  Service: Endoscopy;;  . ESOPHAGOGASTRODUODENOSCOPY (EGD) WITH PROPOFOL N/A 09/10/2016   Procedure: ESOPHAGOGASTRODUODENOSCOPY (EGD) WITH PROPOFOL;  Surgeon: [name], MD;  Location: [facility];  Service: Endoscopy;  Laterality: N/A;  . EUS  06/04/2011   Procedure: UPPER ENDOSCOPIC ULTRASOUND (EUS) LINEAR;  Surgeon: Owens Loffler, MD;  Location: [facility];  Service: Endoscopy;  Laterality: N/A;  . EUS N/A 02/01/2014   Procedure: UPPER ENDOSCOPIC ULTRASOUND (EUS) LINEAR;  Surgeon: [name], MD;  Location: [facility];  Service: Endoscopy;  Laterality: N/A;  . EXPLORATORY LAPAROTOMY      biopsy of intra-abdominal mass  . LAPAROTOMY N/A 11/17/2016   Procedure: EXPLORATORY LAPAROTOMY WITH LYSIS OF ADHESIONS, SMALL BOWEL RESECTION, REPAIR OF INCARCERATED VENTRAL INCISIONAL HERNIA, INSERTION OF BIOLOGIC MESH PATCH,APPLICATION OF WOUND VAC DRESSING;  Surgeon: Armandina Gemma, MD;  Location: [facility];  Service: General;  Laterality: N/A;  . MASTECTOMY W/ SENTINEL NODE BIOPSY  12/18/2011   Procedure: MASTECTOMY WITH SENTINEL LYMPH NODE BIOPSY;  Surgeon:  Rolm Bookbinder, MD;  Location: [facility];  Service: General;  Laterality: Left;  . PORT-A-CATH REMOVAL Right 06/15/2013   Procedure: REMOVAL PORT-A-CATH;  Surgeon: Rolm Bookbinder, MD;  Location: [facility];  Service: General;  Laterality: Right;  . PORTACATH PLACEMENT  01/27/2012   Procedure: INSERTION PORT-A-CATH;  Surgeon: Rolm Bookbinder, MD;  Location: [facility];  Service: General;  Laterality: Right;   PORT PLACEMENT  . TOTAL SHOULDER REPLACEMENT  2011   left  . TUBAL LIGATION      Social History   Social History  . Marital status: Married    Spouse name: N/A  . Number of children: N/A  . Years of education: N/A   Occupational History  . retired    Social History Main Topics  . Smoking status: Never Smoker  . Smokeless tobacco: Never Used  . Alcohol use No  . Drug use: No  . Sexual activity: Yes    Birth control/ protection: Surgical     Comment: mensus age 9, 24st pregnancy 26, no hrt gg4,p3, 1 babay lived a few hours complications   Other Topics Concern  . Not on file   Social History Narrative  . No narrative on file   Family History  Problem Relation Age of Onset  . Prostate cancer Father   . Breast cancer Other     VITAL SIGNS BP 130/82   Pulse 70   Temp 98.6 F (37 C)   Resp 18   Ht 4\' 11"  (1.499 m)   Wt 165 lb 12.8 oz (75.2 kg)   SpO2 96%   BMI 33.49 kg/m   Patient's Medications  New Prescriptions   No medications on file  Previous Medications   ACETAMINOPHEN (TYLENOL) 500 MG TABLET    Take 1,000 mg by mouth every 6 (six) hours as needed (pain).   AMINO ACIDS-PROTEIN HYDROLYS (FEEDING SUPPLEMENT, PRO-STAT SUGAR FREE 64,) LIQD    Take 30 mLs by mouth 2 (two) times daily.   AMLODIPINE (NORVASC) 10 MG TABLET    Take 10 mg by mouth every morning.    ATORVASTATIN (LIPITOR) 40 MG TABLET    Take 1 tablet by mouth daily.   CITALOPRAM (CELEXA) 40 MG TABLET    Take 1 tablet by mouth daily.   DOCUSATE SODIUM (COLACE) 100 MG CAPSULE    Take 1 capsule (100 mg total) by mouth daily.   FERROUS SULFATE 325 (65 FE) MG TABLET    Take 325 mg by mouth daily with breakfast.   GABAPENTIN (NEURONTIN) 300 MG CAPSULE    Take 1 capsule (300 mg total) by mouth at bedtime.   LETROZOLE (FEMARA) 2.5 MG TABLET    Take 1 tablet (2.5 mg total) by mouth daily.   OMEPRAZOLE (PRILOSEC) 40 MG CAPSULE    Take 40 mg by mouth daily.   ONDANSETRON (ZOFRAN) 4 MG TABLET    Take 1 tablet  (4 mg total) by mouth 2 (two) times daily.   OXYCODONE (OXY IR/ROXICODONE) 5 MG IMMEDIATE RELEASE TABLET    Take 1 tablet (5 mg total) by mouth every 4 (four) hours as needed for severe pain or breakthrough pain.   SACCHAROMYCES BOULARDII (FLORASTOR PO)    Take by mouth. Give one capsule two times a day for Prophylaxis   SPIRONOLACTONE (ALDACTONE) 25 MG TABLET    Take 25 mg by mouth every morning.   VITAMIN D, ERGOCALCIFEROL, (DRISDOL) 50000 UNITS CAPS CAPSULE    Take 50,000 Units by mouth every  7 (seven) days. Every Friday  Modified Medications   No medications on file  Discontinued Medications   No medications on file     SIGNIFICANT DIAGNOSTIC EXAMS   11-19-16: chest x-ray: 1. Interim extubation.  Right IJ line NG tube in stable position. 2. Low lung volumes with mild basilar atelectasis.  11-30-16: chest x-ray: minimal consolidation left lung base medially compatible with pneumonia. Probable chronic left tear left rotator cuff   LABS REVIEWED:   11-24-16: wbc 8.7; hgb 8.1; hct 25.1  mcv 87.8; plt 246; glucose 125; bun 7; creat 0.60; k+ 3.6; na++ 138; ca 7.7 11-26-16: glucose 136; bun 10.5; creat 0.63; k+ 3.6; na++ 139; ca 8.1 11-30-16: wbc 9.4; hgb 8.7; hct 26.3; mcv 87.9; plt 382; glucose 163; bun 19.0; creat 0.89; k+ 4.4 ;na++ 138; ca 8.4; liver normal albumin 2.6 12-10-16: glucose 99; bun 13.7; creat 0.80; k+ 4.5; na++ 139; ca 8.5    Review of Systems  Constitutional: Negative for malaise/fatigue.  Respiratory: negative for cough  Negative for shortness of breath.   Cardiovascular: Negative for chest pain and leg swelling.  Gastrointestinal: Negative for abdominal pain and constipation.  Musculoskeletal: Negative for back pain, joint pain and myalgias.  Skin: Negative.   Neurological: Negative for dizziness.  Psychiatric/Behavioral: The patient is not nervous/anxious.      Physical Exam  Constitutional: No distress.  Eyes: Conjunctivae are normal.  Neck: Neck supple. No JVD  present. No thyromegaly present.  Cardiovascular: Normal rate, regular rhythm and intact distal pulses.   Respiratory: Effort normal. No respiratory distress. She has no wheezes.  Diminished breath sounds   GI: Soft. Bowel sounds are normal. She exhibits no distension. There is no tenderness.  Musculoskeletal: She exhibits no edema.  Able to move all extremities   Lymphadenopathy:    She has no cervical adenopathy.  Neurological: She is alert.  Skin: Skin is warm and dry. She is not diaphoretic.  Psychiatric: She has a normal mood and affect.    ASSESSMENT/ PLAN:  Patient is being discharged with the following home health services:  Pt/ot/rn/cna to evaluate and treat as indicated for gait balance strength adl training medication management and adl care.   Patient is being discharged with the following durable medical equipment:   02 at 2 L/[place] for exertion and HS. She will stationary gaseous and portable gaseous oxygen to maintain 02 sat >92%. Patient with bronchitis asthma and recent pneumonia   Patient has been advised to f/u with their PCP in 1-2 weeks to bring them up to date on their rehab stay.  Social services at facility was responsible for arranging this appointment.  Pt was provided with a 30 day supply of prescriptions for medications and refills must be obtained from their PCP.  For controlled substances, a more limited supply may be provided adequate until PCP appointment only. #20 oxycodone 5 mg tabs  Time spent with patient  40    minutes >50% time spent counseling; reviewing medical record; tests; labs; and developing future plan of care    Ok Edwards NP [facility]  Contact [phone] Monday through Friday 8am- 5pm  After hours call [phone]

## 2016-12-17 DIAGNOSIS — J4540 Moderate persistent asthma, uncomplicated: Secondary | ICD-10-CM | POA: Diagnosis not present

## 2016-12-17 DIAGNOSIS — M5030 Other cervical disc degeneration, unspecified cervical region: Secondary | ICD-10-CM | POA: Diagnosis not present

## 2016-12-17 DIAGNOSIS — M47817 Spondylosis without myelopathy or radiculopathy, lumbosacral region: Secondary | ICD-10-CM | POA: Diagnosis not present

## 2016-12-17 DIAGNOSIS — Z48815 Encounter for surgical aftercare following surgery on the digestive system: Secondary | ICD-10-CM | POA: Diagnosis not present

## 2016-12-17 DIAGNOSIS — D500 Iron deficiency anemia secondary to blood loss (chronic): Secondary | ICD-10-CM | POA: Diagnosis not present

## 2016-12-17 DIAGNOSIS — K253 Acute gastric ulcer without hemorrhage or perforation: Secondary | ICD-10-CM | POA: Diagnosis not present

## 2016-12-17 DIAGNOSIS — J4541 Moderate persistent asthma with (acute) exacerbation: Secondary | ICD-10-CM | POA: Diagnosis not present

## 2016-12-17 DIAGNOSIS — I10 Essential (primary) hypertension: Secondary | ICD-10-CM | POA: Diagnosis not present

## 2016-12-17 DIAGNOSIS — M1991 Primary osteoarthritis, unspecified site: Secondary | ICD-10-CM | POA: Diagnosis not present

## 2016-12-17 DIAGNOSIS — D510 Vitamin B12 deficiency anemia due to intrinsic factor deficiency: Secondary | ICD-10-CM | POA: Diagnosis not present

## 2016-12-17 DIAGNOSIS — R69 Illness, unspecified: Secondary | ICD-10-CM | POA: Diagnosis not present

## 2016-12-17 DIAGNOSIS — E7800 Pure hypercholesterolemia, unspecified: Secondary | ICD-10-CM | POA: Diagnosis not present

## 2016-12-30 ENCOUNTER — Ambulatory Visit: Payer: Medicare HMO | Admitting: Cardiology

## 2016-12-30 DIAGNOSIS — E7800 Pure hypercholesterolemia, unspecified: Secondary | ICD-10-CM | POA: Diagnosis not present

## 2016-12-30 DIAGNOSIS — R69 Illness, unspecified: Secondary | ICD-10-CM | POA: Diagnosis not present

## 2016-12-30 DIAGNOSIS — I350 Nonrheumatic aortic (valve) stenosis: Secondary | ICD-10-CM | POA: Diagnosis not present

## 2016-12-30 DIAGNOSIS — K219 Gastro-esophageal reflux disease without esophagitis: Secondary | ICD-10-CM | POA: Diagnosis not present

## 2016-12-30 DIAGNOSIS — D500 Iron deficiency anemia secondary to blood loss (chronic): Secondary | ICD-10-CM | POA: Diagnosis not present

## 2016-12-30 DIAGNOSIS — I10 Essential (primary) hypertension: Secondary | ICD-10-CM | POA: Diagnosis not present

## 2016-12-30 DIAGNOSIS — R0609 Other forms of dyspnea: Secondary | ICD-10-CM | POA: Diagnosis not present

## 2016-12-30 DIAGNOSIS — C50912 Malignant neoplasm of unspecified site of left female breast: Secondary | ICD-10-CM | POA: Diagnosis not present

## 2017-01-11 ENCOUNTER — Other Ambulatory Visit: Payer: Self-pay

## 2017-01-11 DIAGNOSIS — C50412 Malignant neoplasm of upper-outer quadrant of left female breast: Secondary | ICD-10-CM

## 2017-01-12 ENCOUNTER — Ambulatory Visit: Payer: Medicare HMO | Admitting: Hematology and Oncology

## 2017-01-12 ENCOUNTER — Other Ambulatory Visit: Payer: Medicare HMO

## 2017-01-12 ENCOUNTER — Ambulatory Visit: Payer: Medicare HMO

## 2017-01-12 NOTE — Assessment & Plan Note (Deleted)
Left breast cancer multifocal disease 1.7 cm and 0.7 cm, one sentinel lymph node positive with focal extracapsular extension: 1.7 cm mass was ER/PR positive and HER-2 negative with a Ki-67 of 30%: The 0.7 cm mass was ER 100%, PR 7%, Ki-67 15%, HER-2 positive ratio 4.94, status post chemotherapy with Taxol Herceptin from 02/09/2012 to 04/04/2012 discontinued early due to Taxol side effects followed by Herceptin maintenance followed by radiation therapy and adjuvant letrozole was started December 2013  Letrozole toxicities: 1. Osteopenia T score -1.12 January 2016 (Improved from -2.4) continue with Prolia with calcium and vitamin D  she will receive Prolia injections at least for the next one half years.   Patient completes 5 years of antiestrogen therapy by December 2018. I recommended that she discontinue therapy at this time.  Breast Cancer Surveillance: 1. Breast exam 01/12/2017: Left mastectomy scar is intact without any palpable nodularity. Right breast without any lumps or nodules. 2. Mammogram 01/08/2014 No abnormalities. Postsurgical changes. Breast Density Category A.   Hospitalization for incarcerated of hernia status post small bowel resection. Patient appears to be recovering from the surgery. Return to clinic in 1 year for follow-up

## 2017-01-13 ENCOUNTER — Other Ambulatory Visit: Payer: Self-pay

## 2017-01-13 DIAGNOSIS — M8580 Other specified disorders of bone density and structure, unspecified site: Secondary | ICD-10-CM

## 2017-01-14 ENCOUNTER — Encounter (HOSPITAL_BASED_OUTPATIENT_CLINIC_OR_DEPARTMENT_OTHER): Payer: Medicare HMO

## 2017-01-14 ENCOUNTER — Encounter (HOSPITAL_COMMUNITY): Payer: Self-pay | Admitting: Gastroenterology

## 2017-01-14 ENCOUNTER — Ambulatory Visit: Payer: Medicare HMO

## 2017-01-14 ENCOUNTER — Other Ambulatory Visit (HOSPITAL_BASED_OUTPATIENT_CLINIC_OR_DEPARTMENT_OTHER): Payer: Medicare HMO

## 2017-01-14 DIAGNOSIS — M8580 Other specified disorders of bone density and structure, unspecified site: Secondary | ICD-10-CM

## 2017-01-14 DIAGNOSIS — C779 Secondary and unspecified malignant neoplasm of lymph node, unspecified: Secondary | ICD-10-CM | POA: Diagnosis not present

## 2017-01-14 DIAGNOSIS — C50412 Malignant neoplasm of upper-outer quadrant of left female breast: Secondary | ICD-10-CM | POA: Diagnosis not present

## 2017-01-14 LAB — COMPREHENSIVE METABOLIC PANEL
ALBUMIN: 3 g/dL — AB (ref 3.5–5.0)
ALK PHOS: 84 U/L (ref 40–150)
ALT: 8 U/L (ref 0–55)
AST: 8 U/L (ref 5–34)
Anion Gap: 8 mEq/L (ref 3–11)
BUN: 32.1 mg/dL — ABNORMAL HIGH (ref 7.0–26.0)
CO2: 22 mEq/L (ref 22–29)
Calcium: 10 mg/dL (ref 8.4–10.4)
Chloride: 107 mEq/L (ref 98–109)
Creatinine: 1.1 mg/dL (ref 0.6–1.1)
EGFR: 46 mL/min/{1.73_m2} — AB (ref >90)
GLUCOSE: 109 mg/dL (ref 70–140)
POTASSIUM: 4.3 meq/L (ref 3.5–5.1)
SODIUM: 137 meq/L (ref 136–145)
Total Bilirubin: 0.23 mg/dL (ref 0.20–1.20)
Total Protein: 6.7 g/dL (ref 6.4–8.3)

## 2017-01-14 MED ORDER — DENOSUMAB 60 MG/ML ~~LOC~~ SOLN
60.0000 mg | Freq: Once | SUBCUTANEOUS | Status: AC
  Administered 2017-01-14: 60 mg via SUBCUTANEOUS
  Filled 2017-01-14: qty 1

## 2017-01-14 NOTE — Patient Instructions (Signed)
Denosumab injection What is this medicine? DENOSUMAB (den oh sue mab) slows bone breakdown. Prolia is used to treat osteoporosis in women after menopause and in men. Xgeva is used to prevent bone fractures and other bone problems caused by cancer bone metastases. Xgeva is also used to treat giant cell tumor of the bone. This medicine may be used for other purposes; ask your health care provider or pharmacist if you have questions. COMMON BRAND NAME(S): Prolia, XGEVA What should I tell my health care provider before I take this medicine? They need to know if you have any of these conditions: -dental disease -eczema -infection or history of infections -kidney disease or on dialysis -low blood calcium or vitamin D -malabsorption syndrome -scheduled to have surgery or tooth extraction -taking medicine that contains denosumab -thyroid or parathyroid disease -an unusual reaction to denosumab, other medicines, foods, dyes, or preservatives -pregnant or trying to get pregnant -breast-feeding How should I use this medicine? This medicine is for injection under the skin. It is given by a health care professional in a hospital or clinic setting. If you are getting Prolia, a special MedGuide will be given to you by the pharmacist with each prescription and refill. Be sure to read this information carefully each time. For Prolia, talk to your pediatrician regarding the use of this medicine in children. Special care may be needed. For Xgeva, talk to your pediatrician regarding the use of this medicine in children. While this drug may be prescribed for children as young as 13 years for selected conditions, precautions do apply. Overdosage: If you think you've taken too much of this medicine contact a poison control center or emergency room at once. Overdosage: If you think you have taken too much of this medicine contact a poison control center or emergency room at once. NOTE: This medicine is only for  you. Do not share this medicine with others. What if I miss a dose? It is important not to miss your dose. Call your doctor or health care professional if you are unable to keep an appointment. What may interact with this medicine? Do not take this medicine with any of the following medications: -other medicines containing denosumab This medicine may also interact with the following medications: -medicines that suppress the immune system -medicines that treat cancer -steroid medicines like prednisone or cortisone This list may not describe all possible interactions. Give your health care provider a list of all the medicines, herbs, non-prescription drugs, or dietary supplements you use. Also tell them if you smoke, drink alcohol, or use illegal drugs. Some items may interact with your medicine. What should I watch for while using this medicine? Visit your doctor or health care professional for regular checks on your progress. Your doctor or health care professional may order blood tests and other tests to see how you are doing. Call your doctor or health care professional if you get a cold or other infection while receiving this medicine. Do not treat yourself. This medicine may decrease your body's ability to fight infection. You should make sure you get enough calcium and vitamin D while you are taking this medicine, unless your doctor tells you not to. Discuss the foods you eat and the vitamins you take with your health care professional. See your dentist regularly. Brush and floss your teeth as directed. Before you have any dental work done, tell your dentist you are receiving this medicine. Do not become pregnant while taking this medicine or for 5 months after stopping   it. Women should inform their doctor if they wish to become pregnant or think they might be pregnant. There is a potential for serious side effects to an unborn child. Talk to your health care professional or pharmacist for more  information. What side effects may I notice from receiving this medicine? Side effects that you should report to your doctor or health care professional as soon as possible: -allergic reactions like skin rash, itching or hives, swelling of the face, lips, or tongue -breathing problems -chest pain -fast, irregular heartbeat -feeling faint or lightheaded, falls -fever, chills, or any other sign of infection -muscle spasms, tightening, or twitches -numbness or tingling -skin blisters or bumps, or is dry, peels, or red -slow healing or unexplained pain in the mouth or jaw -unusual bleeding or bruising Side effects that usually do not require medical attention (Report these to your doctor or health care professional if they continue or are bothersome.): -muscle pain -stomach upset, gas This list may not describe all possible side effects. Call your doctor for medical advice about side effects. You may report side effects to FDA at 1-800-FDA-1088. Where should I keep my medicine? This medicine is only given in a clinic, doctor's office, or other health care setting and will not be stored at home. NOTE: This sheet is a summary. It may not cover all possible information. If you have questions about this medicine, talk to your doctor, pharmacist, or health care provider.  2015, Elsevier/Gold Standard. (2011-12-21 12:37:47)  

## 2017-01-14 NOTE — Anesthesia Postprocedure Evaluation (Signed)
Anesthesia Post Note  Patient: Brandy [name]  Procedure(s) Performed: Procedure(s) (LRB): ESOPHAGOGASTRODUODENOSCOPY (EGD) WITH PROPOFOL (N/A)     Anesthesia Post Evaluation  Last Vitals:  Vitals:   09/10/16 0800 09/10/16 0811  BP: (!) 126/39 (!) 126/33  Pulse: 74   Resp: 19   Temp: 36.5 C     Last Pain:  Vitals:   09/10/16 0800  TempSrc: Oral                 Lynda Rainwater

## 2017-01-14 NOTE — Addendum Note (Signed)
Addendum  created 01/14/17 1632 by Lynda Rainwater, MD   Sign clinical note

## 2017-01-19 ENCOUNTER — Ambulatory Visit (INDEPENDENT_AMBULATORY_CARE_PROVIDER_SITE_OTHER): Payer: Medicare HMO | Admitting: Physician Assistant

## 2017-01-19 ENCOUNTER — Encounter: Payer: Self-pay | Admitting: Physician Assistant

## 2017-01-19 VITALS — BP 102/42 | HR 82 | Ht 59.0 in | Wt 159.2 lb

## 2017-01-19 DIAGNOSIS — I5033 Acute on chronic diastolic (congestive) heart failure: Secondary | ICD-10-CM | POA: Insufficient documentation

## 2017-01-19 DIAGNOSIS — I10 Essential (primary) hypertension: Secondary | ICD-10-CM

## 2017-01-19 DIAGNOSIS — I350 Nonrheumatic aortic (valve) stenosis: Secondary | ICD-10-CM

## 2017-01-19 MED ORDER — HYDROCHLOROTHIAZIDE 25 MG PO TABS: 25.0000 mg | ORAL_TABLET | Freq: Every day | ORAL | 3 refills | Status: DC

## 2017-01-19 MED ORDER — AMLODIPINE BESYLATE 5 MG PO TABS: 5.0000 mg | ORAL_TABLET | Freq: Every day | ORAL | 3 refills | Status: DC

## 2017-01-19 NOTE — Patient Instructions (Addendum)
Medication Instructions:  1. DECREASE NORVASC TO 5 MG DAILY; NEW RX HAS BEEN SENT IN FOR THE 5 MG TABLET  2. START HCTZ 25 MG DAILY; RX HAS BEEN SENT IN  Labwork: TODAY BMET, PRO BNP  Testing/Procedures: NONE ORDERED TODAY  Follow-Up: 02/23/17 @ 1:15 WITH MICHELE LENZE, PAC   Any Other Special Instructions Will Be Listed Below (If Applicable). If you need a refill on your cardiac medications before your next appointment, please call your pharmacy.   Low-Sodium Eating Plan Sodium, which is an element that makes up salt, helps you maintain a healthy balance of fluids in your body. Too much sodium can increase your blood pressure and cause fluid and waste to be held in your body. Your health care provider or dietitian may recommend following this plan if you have high blood pressure (hypertension), kidney disease, liver disease, or heart failure. Eating less sodium can help lower your blood pressure, reduce swelling, and protect your heart, liver, and kidneys. What are tips for following this plan? General guidelines  Most people on this plan should limit their sodium intake to 1,500-2,000 mg (milligrams) of sodium each day. Reading food labels  The Nutrition Facts label lists the amount of sodium in one serving of the food. If you eat more than one serving, you must multiply the listed amount of sodium by the number of servings.  Choose foods with less than 140 mg of sodium per serving.  Avoid foods with 300 mg of sodium or more per serving. Shopping  Look for lower-sodium products, often labeled as "low-sodium" or "no salt added."  Always check the sodium content even if foods are labeled as "unsalted" or "no salt added".  Buy fresh foods. ? Avoid canned foods and premade or frozen meals. ? Avoid canned, cured, or processed meats  Buy breads that have less than 80 mg of sodium per slice. Cooking  Eat more home-cooked food and less restaurant, buffet, and fast food.  Avoid  adding salt when cooking. Use salt-free seasonings or herbs instead of table salt or sea salt. Check with your health care provider or pharmacist before using salt substitutes.  Cook with plant-based oils, such as canola, sunflower, or olive oil. Meal planning  When eating at a restaurant, ask that your food be prepared with less salt or no salt, if possible.  Avoid foods that contain MSG (monosodium glutamate). MSG is sometimes added to [place] food, bouillon, and some canned foods. What foods are recommended? The items listed may not be a complete list. Talk with your dietitian about what dietary choices are best for you. Grains Low-sodium cereals, including oats, puffed wheat and rice, and shredded wheat. Low-sodium crackers. Unsalted rice. Unsalted pasta. Low-sodium bread. Whole-grain breads and whole-grain pasta. Vegetables Fresh or frozen vegetables. "No salt added" canned vegetables. "No salt added" tomato sauce and paste. Low-sodium or reduced-sodium tomato and vegetable juice. Fruits Fresh, frozen, or canned fruit. Fruit juice. Meats and other protein foods Fresh or frozen (no salt added) meat, poultry, seafood, and fish. Low-sodium canned tuna and salmon. Unsalted nuts. Dried peas, beans, and lentils without added salt. Unsalted canned beans. Eggs. Unsalted nut butters. Dairy Milk. Soy milk. Cheese that is naturally low in sodium, such as ricotta cheese, fresh mozzarella, or Swiss cheese Low-sodium or reduced-sodium cheese. Cream cheese. Yogurt. Fats and oils Unsalted butter. Unsalted margarine with no trans fat. Vegetable oils such as canola or olive oils. Seasonings and other foods Fresh and dried herbs and spices. Salt-free seasonings.  Low-sodium mustard and ketchup. Sodium-free salad dressing. Sodium-free light mayonnaise. Fresh or refrigerated horseradish. Lemon juice. Vinegar. Homemade, reduced-sodium, or low-sodium soups. Unsalted popcorn and pretzels. Low-salt or salt-free  chips. What foods are not recommended? The items listed may not be a complete list. Talk with your dietitian about what dietary choices are best for you. Grains Instant hot cereals. Bread stuffing, pancake, and biscuit mixes. Croutons. Seasoned rice or pasta mixes. Noodle soup cups. Boxed or frozen macaroni and cheese. Regular salted crackers. Self-rising flour. Vegetables Sauerkraut, pickled vegetables, and relishes. Olives. [place] fries. Onion rings. Regular canned vegetables (not low-sodium or reduced-sodium). Regular canned tomato sauce and paste (not low-sodium or reduced-sodium). Regular tomato and vegetable juice (not low-sodium or reduced-sodium). Frozen vegetables in sauces. Meats and other protein foods Meat or fish that is salted, canned, smoked, spiced, or pickled. Bacon, ham, sausage, hotdogs, corned beef, chipped beef, packaged lunch meats, salt pork, jerky, pickled herring, anchovies, regular canned tuna, sardines, salted nuts. Dairy Processed cheese and cheese spreads. Cheese curds. Blue cheese. Feta cheese. String cheese. Regular cottage cheese. Buttermilk. Canned milk. Fats and oils Salted butter. Regular margarine. Ghee. Bacon fat. Seasonings and other foods Onion salt, garlic salt, seasoned salt, table salt, and sea salt. Canned and packaged gravies. Worcestershire sauce. Tartar sauce. Barbecue sauce. Teriyaki sauce. Soy sauce, including reduced-sodium. Steak sauce. Fish sauce. Oyster sauce. Cocktail sauce. Horseradish that you find on the shelf. Regular ketchup and mustard. Meat flavorings and tenderizers. Bouillon cubes. Hot sauce and Tabasco sauce. Premade or packaged marinades. Premade or packaged taco seasonings. Relishes. Regular salad dressings. Salsa. Potato and tortilla chips. Corn chips and puffs. Salted popcorn and pretzels. Canned or dried soups. Pizza. Frozen entrees and pot pies. Summary  Eating less sodium can help lower your blood pressure, reduce swelling, and  protect your heart, liver, and kidneys.  Most people on this plan should limit their sodium intake to 1,500-2,000 mg (milligrams) of sodium each day.  Canned, boxed, and frozen foods are high in sodium. Restaurant foods, fast foods, and pizza are also very high in sodium. You also get sodium by adding salt to food.  Try to cook at home, eat more fresh fruits and vegetables, and eat less fast food, canned, processed, or prepared foods. This information is not intended to replace advice given to you by your health care provider. Make sure you discuss any questions you have with your health care provider. Document Released: 12/12/2001 Document Revised: 06/15/2016 Document Reviewed: 06/15/2016 Elsevier Interactive Patient Education  2017 [employer].

## 2017-01-19 NOTE — Progress Notes (Signed)
Cardiology Office Note    Date:  01/19/2017   ID:  Brandy [name], DOB 10/10/1932, MRN [id number]  PCP:  Kathyrn Lass, MD  Cardiologist: Dr. Meda Coffee  No chief complaint on file.   History of Present Illness:  Brandy [name] is a 83 y.o. female with history of HTN, HLD invasive ductal carcinoma of her left breast 2013. Saw Dr. Meda Coffee 09/2016 with dyspnea on exertion and abnormal echo showing moderate aortic stenosis and mild AI. EKG was also abnormal with T-wave in inversion inferiorly which more pronounced from prior EKGs. Repeat 2-D echo 10/05/16 showed moderate aortic stenosis LVEF 60-65%. Medical therapy was recommended.If severe AS, TAVR referral was recommended.  Patient was hospitalized in May 2018 with incarcerated incisional hernia status post resection and repair. She had a Lexi scan Myoview 11/17/16 preop that was normal without ischemia, LVEF 71%. Her Diovan/HCTZ and spironolactone were held at discharge because of low blood pressures. Spironolactone has since been restarted at 25 mg once daily. She also suffered from pneumonia and asthma after hospitalization.  Patient comes in today accompanied by her husband. She complains of shortness of breath with walking to the mailbox and when she is trying to get ready for bed. Her blood pressure remains low. She has lost 17 pounds since her surgery.   Past Medical History:  Diagnosis Date  . Anemia   . Arthritis    shoulders  . Asthma    mild, no inhalers used  . Breast cancer ([place]) 12/18/11   left breast masectomy=metastatic ca in (1/1) lymph node ,invasive ductal ca,2 foci,,dcis,lymph ovascular invasion identified,surgical resection margins neg for ca,additional tissue=benign skin and subcutaneous tissue  . Bronchitis    hx of;last time >18yrago  . Depression    takes Celexa daily  . Difficult intubation   . Dysphagia   . Full dentures   . Gall stones 2015  . GERD (gastroesophageal reflux disease)    takes Prilosec prn  .  H/O hiatal hernia   . History of kidney stones    many yrs ago  . Hx of radiation therapy 04/26/12 -06/10/12   left breast  . Hyperlipidemia    takes Simvastatin daily  . Hypertension    takes Amlodipine and Diovan daily  . Insomnia    takes Ambien nightly  . Urinary urgency     Past Surgical History:  Procedure Laterality Date  . APPENDECTOMY    . bladder tack    . BREAST BIOPSY  1998   left   . DILATION AND CURETTAGE OF UTERUS    . ENDOSCOPIC RETROGRADE CHOLANGIOPANCREATOGRAPHY (ERCP) WITH PROPOFOL N/A 02/01/2014   Procedure: ENDOSCOPIC RETROGRADE CHOLANGIOPANCREATOGRAPHY (ERCP) WITH PROPOFOL;  Surgeon: DMilus Banister MD;  Location: [facility];  Service: Endoscopy;  Laterality: N/A;  . ESOPHAGOGASTRODUODENOSCOPY    . ESOPHAGOGASTRODUODENOSCOPY N/A 06/26/2016   Procedure: ESOPHAGOGASTRODUODENOSCOPY (EGD);  Surgeon: DMilus Banister MD;  [facility] [facility]  Service: Endoscopy;  Laterality: N/A;  . ESOPHAGOGASTRODUODENOSCOPY (EGD) WITH PROPOFOL  12/19/2013   Procedure: ESOPHAGOGASTRODUODENOSCOPY (EGD) WITH PROPOFOL;  Surgeon: PBeryle Beams MD;  [place] [facility]  Service: Endoscopy;;  . ESOPHAGOGASTRODUODENOSCOPY (EGD) WITH PROPOFOL N/A 09/10/2016   Procedure: ESOPHAGOGASTRODUODENOSCOPY (EGD) WITH PROPOFOL;  Surgeon: JMilus Banister MD;  [facility];  Service: Endoscopy;  Laterality: N/A;  . EUS  06/04/2011   Procedure: UPPER ENDOSCOPIC ULTRASOUND (EUS) LINEAR;  Surgeon: DOwens Loffler MD;  [facility];  Service: Endoscopy;  Laterality: N/A;  . EUS N/A 02/01/2014  Procedure: UPPER ENDOSCOPIC ULTRASOUND (EUS) LINEAR;  Surgeon: [name], MD;  [facility];  Service: Endoscopy;  Laterality: N/A;  . EXPLORATORY LAPAROTOMY      biopsy of intra-abdominal mass  . LAPAROTOMY N/A 11/17/2016   Procedure: EXPLORATORY LAPAROTOMY WITH LYSIS OF ADHESIONS, SMALL BOWEL RESECTION, REPAIR OF INCARCERATED VENTRAL INCISIONAL HERNIA, INSERTION OF  BIOLOGIC MESH PATCH,APPLICATION OF WOUND VAC DRESSING;  Surgeon: Armandina Gemma, MD;  Location: [facility];  Service: General;  Laterality: N/A;  . MASTECTOMY W/ SENTINEL NODE BIOPSY  12/18/2011   Procedure: MASTECTOMY WITH SENTINEL LYMPH NODE BIOPSY;  Surgeon: Rolm Bookbinder, MD;  Location: [place] [facility];  Service: General;  Laterality: Left;  . PORT-A-CATH REMOVAL Right 06/15/2013   Procedure: REMOVAL PORT-A-CATH;  Surgeon: Rolm Bookbinder, MD;  Location: [facility];  Service: General;  Laterality: Right;  . PORTACATH PLACEMENT  01/27/2012   Procedure: INSERTION PORT-A-CATH;  Surgeon: Rolm Bookbinder, MD;  Location: [facility];  Service: General;  Laterality: Right;  PORT PLACEMENT  . TOTAL SHOULDER REPLACEMENT  2011   left  . TUBAL LIGATION      Current Medications: Current Meds  Medication Sig  . acetaminophen (TYLENOL) 500 MG tablet Take 1,000 mg by mouth every 6 (six) hours as needed (pain).  . Amino Acids-Protein Hydrolys (FEEDING SUPPLEMENT, PRO-STAT SUGAR FREE 64,) LIQD Take 30 mLs by mouth 2 (two) times daily.  Marland Kitchen atorvastatin (LIPITOR) 40 MG tablet Take 1 tablet by mouth daily.  . citalopram (CELEXA) 40 MG tablet Take 1 tablet by mouth daily.  Marland Kitchen docusate sodium (COLACE) 100 MG capsule Take 1 capsule (100 mg total) by mouth daily.  . ferrous sulfate 325 (65 FE) MG tablet Take 325 mg by mouth daily with breakfast.  . gabapentin (NEURONTIN) 300 MG capsule Take 1 capsule (300 mg total) by mouth at bedtime.  Marland Kitchen letrozole (FEMARA) 2.5 MG tablet Take 1 tablet (2.5 mg total) by mouth daily.  Marland Kitchen omeprazole (PRILOSEC) 40 MG capsule Take 40 mg by mouth daily.  . ondansetron (ZOFRAN) 4 MG tablet Take 4 mg by mouth daily.  Marland Kitchen oxyCODONE (OXY IR/ROXICODONE) 5 MG immediate release tablet Take 1 tablet (5 mg total) by mouth every 4 (four) hours as needed for severe pain or breakthrough pain.  . Saccharomyces boulardii (FLORASTOR PO) Take by mouth. Give one capsule two times a day  for Prophylaxis  . spironolactone (ALDACTONE) 25 MG tablet Take 25 mg by mouth every morning.  . Vitamin D, Ergocalciferol, (DRISDOL) 50000 units CAPS capsule Take 50,000 Units by mouth every 7 (seven) days. Every Friday  . [DISCONTINUED] amLODipine (NORVASC) 10 MG tablet Take 10 mg by mouth every morning.      Allergies:   Patient has no known allergies.   Social History   Social History  . Marital status: Married    Spouse name: N/A  . Number of children: N/A  . Years of education: N/A   Occupational History  . retired    Social History Main Topics  . Smoking status: Never Smoker  . Smokeless tobacco: Never Used  . Alcohol use No  . Drug use: No  . Sexual activity: Yes    Birth control/ protection: Surgical     Comment: mensus age 59, 34st pregnancy 57, no hrt gg4,p3, 1 babay lived a few hours complications   Other Topics Concern  . None   Social History Narrative  . None     Family History:  The patient's  family history  includes Breast cancer in her other; Prostate cancer in her father.   ROS:   Please see the history of present illness.    Review of Systems  Constitution: Negative.  HENT: Negative.   Eyes: Negative.   Cardiovascular: Positive for dyspnea on exertion.  Respiratory: Negative.   Hematologic/Lymphatic: Negative.   Musculoskeletal: Negative.  Negative for joint pain.  Gastrointestinal: Negative.   Genitourinary: Negative.   Neurological: Positive for dizziness.   All other systems reviewed and are negative.   PHYSICAL EXAM:   VS:  BP (!) 102/42   Pulse 82   Ht 4' 11" (1.499 m)   Wt 159 lb 3.2 oz (72.2 kg)   SpO2 95%   BMI 32.15 kg/m   Physical Exam  GEN: Well nourished, well developed, in no acute distress  Neck: no JVD, carotid bruits, or masses Cardiac:RRR; 7-7/4 harsh systolic murmur at the left sternal border Respiratory:  Decreased breath sounds with fine crackles at the bases GI: soft, nontender, nondistended, + BS Ext: +1 edema  bilaterally without cyanosis, clubbing. Good distal pulses bilaterally Neuro:  Alert and Oriented x 3 Psych: euthymic mood, full affect  Wt Readings from Last 3 Encounters:  01/19/17 159 lb 3.2 oz (72.2 kg)  12/11/16 165 lb 12.8 oz (75.2 kg)  12/07/16 167 lb 6.4 oz (75.9 kg)      Studies/Labs Reviewed:   EKG:  EKG is not ordered today.    Recent Labs: 11/22/2016: Magnesium 1.9 11/30/2016: Hemoglobin 8.7; Platelets 382 01/14/2017: ALT 8; BUN 32.1; Creatinine 1.1; Potassium 4.3; Sodium 137   Lipid Panel No results found for: CHOL, TRIG, HDL, CHOLHDL, VLDL, LDLCALC, LDLDIRECT  Additional studies/ records that were reviewed today include:  Lexi scan Myoview 11/17/16  IMPRESSION: 1. Suspect inferior wall scar/infarction. No findings for ischemia.   2. Normal left ventricular wall motion except for the inferior wall.   3. Left ventricular ejection fraction 71%   4. Non invasive risk stratification*: Intermediate    2-D echo 4/2/18Study Conclusions   - Left ventricle: The cavity size was normal. Wall thickness was   normal. Systolic function was normal. The estimated ejection   fraction was in the range of 60% to 65%. Wall motion was normal;   there were no regional wall motion abnormalities. Doppler   parameters are consistent with abnormal left ventricular   relaxation (grade 1 diastolic dysfunction). Doppler parameters   are consistent with elevated mean left atrial filling pressure. - Aortic valve: Valve mobility was moderately restricted. There was   moderate stenosis. There was trivial regurgitation. Mean gradient   (S): 25 mm Hg. Peak gradient (S): 43 mm Hg. Valve area (VTI):   1.37 cm^2. Valve area (Vmax): 1.39 cm^2. Valve area (Vmean): 1.35   cm^2. - Mitral valve: Moderately to severely calcified annulus. Mildly   thickened leaflets . Mean gradient (D): 3 mm Hg. Peak gradient   (D): 9 mm Hg. Valve area by pressure half-time: 2.34 cm^2. Valve   area by continuity  equation (using LVOT flow): 2.64 cm^2. - Left atrium: The atrium was mildly dilated. - Atrial septum: No defect or patent foramen ovale was identified.   Impressions:   - The degree of aortic stenosis appears unchanged compared to the   last study (gradients may have been slightly overestimated on the   previous study due to the irregular rhythm).   TTE: 06/15/2016   - Left ventricle: The cavity size was normal. There was mild   concentric hypertrophy. Systolic function  was vigorous. The   estimated ejection fraction was in the range of 65% to 70%. Wall   motion was normal; there were no regional wall motion   abnormalities. Doppler parameters are consistent with abnormal   left ventricular relaxation (grade 1 diastolic dysfunction).   Doppler parameters are consistent with elevated ventricular   end-diastolic filling pressure. - Aortic valve: Valve mobility was restricted. There was moderate   stenosis. There was mild regurgitation. Mean gradient (S): 32 mm   Hg. Peak gradient (S): 59 mm Hg. Valve area (VTI): 1.08 cm^2.   Valve area (Vmax): 1.01 cm^2. Valve area (Vmean): 1 cm^2. - Aortic root: The aortic root was normal in size. - Mitral valve: Calcified annulus. Valve area by pressure   half-time: 1.96 cm^2. Valve area by continuity equation (using   LVOT flow): 2 cm^2. - Left atrium: The atrium was at the upper limits of normal in   size. - Right ventricle: Systolic function was normal. - Right atrium: The atrium was normal in size. - Tricuspid valve: There was mild regurgitation. - Pulmonary arteries: Systolic pressure was mildly increased. PA   peak pressure: 32 mm Hg (S). - Inferior vena cava: The vessel was normal in size. The   respirophasic diameter changes were in the normal range (= 50%),   consistent with normal central venous pressure. - Pericardium, extracardiac: There was no pericardial effusion.   Impressions: - When compared to the prior study from  04/19/2013, aortic stenosis   is now moderate, there is mild aortic regurgitation.           ASSESSMENT:    1. Moderate aortic stenosis   2. Acute on chronic diastolic CHF (congestive heart failure) (HCC)   3. Essential hypertension      PLAN:  In order of problems listed above:  Moderate aortic stenosis stable on echo 10/2016. Lexi scan Myoview prior to surgery 11/17/16 was normal without ischemia.   Acute on chronic diastolic CHF patient has increased dyspnea on exertion and edema on exam. Her blood pressure remains low I suspect due to weight loss after abdominal surgery. We'll decrease Norvasc to 5 mg once daily and add back HCTZ 25 mg once a day. I will not restart Diovan at this time. Check be met and BNP today. I will see her back in one month. 2 g sodium diet discussed with patient.  Essential hypertension blood pressures been on the low side        Medication Adjustments/Labs and Tests Ordered: Current medicines are reviewed at length with the patient today.  Concerns regarding medicines are outlined above.  Medication changes, Labs and Tests ordered today are listed in the Patient Instructions below. Patient Instructions  Medication Instructions:  1. DECREASE NORVASC TO 5 MG DAILY; NEW RX HAS BEEN SENT IN FOR THE 5 MG TABLET  2. START HCTZ 25 MG DAILY; RX HAS BEEN SENT IN  Labwork: TODAY BMET, PRO BNP  Testing/Procedures: NONE ORDERED TODAY  Follow-Up: 02/23/17 @ 1:15 WITH  , PAC   Any Other Special Instructions Will Be Listed Below (If Applicable). If you need a refill on your cardiac medications before your next appointment, please call your pharmacy.   Low-Sodium Eating Plan Sodium, which is an element that makes up salt, helps you maintain a healthy balance of fluids in your body. Too much sodium can increase your blood pressure and cause fluid and waste to be held in your body. Your health care provider or dietitian may recommend    following this plan if you have high blood pressure (hypertension), kidney disease, liver disease, or heart failure. Eating less sodium can help lower your blood pressure, reduce swelling, and protect your heart, liver, and kidneys. What are tips for following this plan? General guidelines  Most people on this plan should limit their sodium intake to 1,500-2,000 mg (milligrams) of sodium each day. Reading food labels  The Nutrition Facts label lists the amount of sodium in one serving of the food. If you eat more than one serving, you must multiply the listed amount of sodium by the number of servings.  Choose foods with less than 140 mg of sodium per serving.  Avoid foods with 300 mg of sodium or more per serving. Shopping  Look for lower-sodium products, often labeled as "low-sodium" or "no salt added."  Always check the sodium content even if foods are labeled as "unsalted" or "no salt added".  Buy fresh foods. ? Avoid canned foods and premade or frozen meals. ? Avoid canned, cured, or processed meats  Buy breads that have less than 80 mg of sodium per slice. Cooking  Eat more home-cooked food and less restaurant, buffet, and fast food.  Avoid adding salt when cooking. Use salt-free seasonings or herbs instead of table salt or sea salt. Check with your health care provider or pharmacist before using salt substitutes.  Cook with plant-based oils, such as canola, sunflower, or olive oil. Meal planning  When eating at a restaurant, ask that your food be prepared with less salt or no salt, if possible.  Avoid foods that contain MSG (monosodium glutamate). MSG is sometimes added to Chinese food, bouillon, and some canned foods. What foods are recommended? The items listed may not be a complete list. Talk with your dietitian about what dietary choices are best for you. Grains Low-sodium cereals, including oats, puffed wheat and rice, and shredded wheat. Low-sodium crackers.  Unsalted rice. Unsalted pasta. Low-sodium bread. Whole-grain breads and whole-grain pasta. Vegetables Fresh or frozen vegetables. "No salt added" canned vegetables. "No salt added" tomato sauce and paste. Low-sodium or reduced-sodium tomato and vegetable juice. Fruits Fresh, frozen, or canned fruit. Fruit juice. Meats and other protein foods Fresh or frozen (no salt added) meat, poultry, seafood, and fish. Low-sodium canned tuna and salmon. Unsalted nuts. Dried peas, beans, and lentils without added salt. Unsalted canned beans. Eggs. Unsalted nut butters. Dairy Milk. Soy milk. Cheese that is naturally low in sodium, such as ricotta cheese, fresh mozzarella, or Swiss cheese Low-sodium or reduced-sodium cheese. Cream cheese. Yogurt. Fats and oils Unsalted butter. Unsalted margarine with no trans fat. Vegetable oils such as canola or olive oils. Seasonings and other foods Fresh and dried herbs and spices. Salt-free seasonings. Low-sodium mustard and ketchup. Sodium-free salad dressing. Sodium-free light mayonnaise. Fresh or refrigerated horseradish. Lemon juice. Vinegar. Homemade, reduced-sodium, or low-sodium soups. Unsalted popcorn and pretzels. Low-salt or salt-free chips. What foods are not recommended? The items listed may not be a complete list. Talk with your dietitian about what dietary choices are best for you. Grains Instant hot cereals. Bread stuffing, pancake, and biscuit mixes. Croutons. Seasoned rice or pasta mixes. Noodle soup cups. Boxed or frozen macaroni and cheese. Regular salted crackers. Self-rising flour. Vegetables Sauerkraut, pickled vegetables, and relishes. Olives. French fries. Onion rings. Regular canned vegetables (not low-sodium or reduced-sodium). Regular canned tomato sauce and paste (not low-sodium or reduced-sodium). Regular tomato and vegetable juice (not low-sodium or reduced-sodium). Frozen vegetables in sauces. Meats and other protein foods Meat or   fish that is  salted, canned, smoked, spiced, or pickled. Bacon, ham, sausage, hotdogs, corned beef, chipped beef, packaged lunch meats, salt pork, jerky, pickled herring, anchovies, regular canned tuna, sardines, salted nuts. Dairy Processed cheese and cheese spreads. Cheese curds. Blue cheese. Feta cheese. String cheese. Regular cottage cheese. Buttermilk. Canned milk. Fats and oils Salted butter. Regular margarine. Ghee. Bacon fat. Seasonings and other foods Onion salt, garlic salt, seasoned salt, table salt, and sea salt. Canned and packaged gravies. Worcestershire sauce. Tartar sauce. Barbecue sauce. Teriyaki sauce. Soy sauce, including reduced-sodium. Steak sauce. Fish sauce. Oyster sauce. Cocktail sauce. Horseradish that you find on the shelf. Regular ketchup and mustard. Meat flavorings and tenderizers. Bouillon cubes. Hot sauce and Tabasco sauce. Premade or packaged marinades. Premade or packaged taco seasonings. Relishes. Regular salad dressings. Salsa. Potato and tortilla chips. Corn chips and puffs. Salted popcorn and pretzels. Canned or dried soups. Pizza. Frozen entrees and pot pies. Summary  Eating less sodium can help lower your blood pressure, reduce swelling, and protect your heart, liver, and kidneys.  Most people on this plan should limit their sodium intake to 1,500-2,000 mg (milligrams) of sodium each day.  Canned, boxed, and frozen foods are high in sodium. Restaurant foods, fast foods, and pizza are also very high in sodium. You also get sodium by adding salt to food.  Try to cook at home, eat more fresh fruits and vegetables, and eat less fast food, canned, processed, or prepared foods. This information is not intended to replace advice given to you by your health care provider. Make sure you discuss any questions you have with your health care provider. Document Released: 12/12/2001 Document Revised: 06/15/2016 Document Reviewed: 06/15/2016 Elsevier Interactive Patient Education  2017  [place], Ermalinda Barrios, [place]  01/19/2017 1:35 PM    [facility] [place], [place], [place]  [address] Phone: [phone]; Fax: [phone]

## 2017-01-20 DIAGNOSIS — Z48815 Encounter for surgical aftercare following surgery on the digestive system: Secondary | ICD-10-CM | POA: Diagnosis not present

## 2017-01-20 LAB — BASIC METABOLIC PANEL
BUN / CREAT RATIO: 22 (ref 12–28)
BUN: 21 mg/dL (ref 8–27)
CALCIUM: 8.2 mg/dL — AB (ref 8.7–10.3)
CO2: 17 mmol/L — ABNORMAL LOW (ref 20–29)
Chloride: 108 mmol/L — ABNORMAL HIGH (ref 96–106)
Creatinine, Ser: 0.95 mg/dL (ref 0.57–1.00)
GFR, EST AFRICAN AMERICAN: 65 mL/min/{1.73_m2} (ref >59)
GFR, EST NON AFRICAN AMERICAN: 56 mL/min/{1.73_m2} — AB (ref >59)
Glucose: 149 mg/dL — ABNORMAL HIGH (ref 65–99)
Potassium: 4.2 mmol/L (ref 3.5–5.2)
Sodium: 142 mmol/L (ref 134–144)

## 2017-01-20 LAB — PRO B NATRIURETIC PEPTIDE: NT-Pro BNP: 247 pg/mL (ref 0–738)

## 2017-02-15 DIAGNOSIS — M47817 Spondylosis without myelopathy or radiculopathy, lumbosacral region: Secondary | ICD-10-CM | POA: Diagnosis not present

## 2017-02-15 DIAGNOSIS — M1991 Primary osteoarthritis, unspecified site: Secondary | ICD-10-CM | POA: Diagnosis not present

## 2017-02-15 DIAGNOSIS — M5030 Other cervical disc degeneration, unspecified cervical region: Secondary | ICD-10-CM | POA: Diagnosis not present

## 2017-02-15 DIAGNOSIS — R69 Illness, unspecified: Secondary | ICD-10-CM | POA: Diagnosis not present

## 2017-02-15 DIAGNOSIS — Z48815 Encounter for surgical aftercare following surgery on the digestive system: Secondary | ICD-10-CM | POA: Diagnosis not present

## 2017-02-15 DIAGNOSIS — E7800 Pure hypercholesterolemia, unspecified: Secondary | ICD-10-CM | POA: Diagnosis not present

## 2017-02-15 DIAGNOSIS — K253 Acute gastric ulcer without hemorrhage or perforation: Secondary | ICD-10-CM | POA: Diagnosis not present

## 2017-02-15 DIAGNOSIS — D500 Iron deficiency anemia secondary to blood loss (chronic): Secondary | ICD-10-CM | POA: Diagnosis not present

## 2017-02-15 DIAGNOSIS — J4541 Moderate persistent asthma with (acute) exacerbation: Secondary | ICD-10-CM | POA: Diagnosis not present

## 2017-02-15 DIAGNOSIS — I10 Essential (primary) hypertension: Secondary | ICD-10-CM | POA: Diagnosis not present

## 2017-02-16 DIAGNOSIS — K253 Acute gastric ulcer without hemorrhage or perforation: Secondary | ICD-10-CM | POA: Diagnosis not present

## 2017-02-16 DIAGNOSIS — M1991 Primary osteoarthritis, unspecified site: Secondary | ICD-10-CM | POA: Diagnosis not present

## 2017-02-16 DIAGNOSIS — E7800 Pure hypercholesterolemia, unspecified: Secondary | ICD-10-CM | POA: Diagnosis not present

## 2017-02-16 DIAGNOSIS — D500 Iron deficiency anemia secondary to blood loss (chronic): Secondary | ICD-10-CM | POA: Diagnosis not present

## 2017-02-16 DIAGNOSIS — M47817 Spondylosis without myelopathy or radiculopathy, lumbosacral region: Secondary | ICD-10-CM | POA: Diagnosis not present

## 2017-02-16 DIAGNOSIS — Z48815 Encounter for surgical aftercare following surgery on the digestive system: Secondary | ICD-10-CM | POA: Diagnosis not present

## 2017-02-16 DIAGNOSIS — M5030 Other cervical disc degeneration, unspecified cervical region: Secondary | ICD-10-CM | POA: Diagnosis not present

## 2017-02-16 DIAGNOSIS — I10 Essential (primary) hypertension: Secondary | ICD-10-CM | POA: Diagnosis not present

## 2017-02-16 DIAGNOSIS — J4541 Moderate persistent asthma with (acute) exacerbation: Secondary | ICD-10-CM | POA: Diagnosis not present

## 2017-02-16 DIAGNOSIS — R69 Illness, unspecified: Secondary | ICD-10-CM | POA: Diagnosis not present

## 2017-02-22 DIAGNOSIS — E7800 Pure hypercholesterolemia, unspecified: Secondary | ICD-10-CM | POA: Diagnosis not present

## 2017-02-22 DIAGNOSIS — M5030 Other cervical disc degeneration, unspecified cervical region: Secondary | ICD-10-CM | POA: Diagnosis not present

## 2017-02-22 DIAGNOSIS — D500 Iron deficiency anemia secondary to blood loss (chronic): Secondary | ICD-10-CM | POA: Diagnosis not present

## 2017-02-22 DIAGNOSIS — M47817 Spondylosis without myelopathy or radiculopathy, lumbosacral region: Secondary | ICD-10-CM | POA: Diagnosis not present

## 2017-02-22 DIAGNOSIS — J4541 Moderate persistent asthma with (acute) exacerbation: Secondary | ICD-10-CM | POA: Diagnosis not present

## 2017-02-22 DIAGNOSIS — K253 Acute gastric ulcer without hemorrhage or perforation: Secondary | ICD-10-CM | POA: Diagnosis not present

## 2017-02-22 DIAGNOSIS — M1991 Primary osteoarthritis, unspecified site: Secondary | ICD-10-CM | POA: Diagnosis not present

## 2017-02-22 DIAGNOSIS — I10 Essential (primary) hypertension: Secondary | ICD-10-CM | POA: Diagnosis not present

## 2017-02-22 DIAGNOSIS — Z48815 Encounter for surgical aftercare following surgery on the digestive system: Secondary | ICD-10-CM | POA: Diagnosis not present

## 2017-02-22 DIAGNOSIS — R69 Illness, unspecified: Secondary | ICD-10-CM | POA: Diagnosis not present

## 2017-02-23 ENCOUNTER — Encounter: Payer: Self-pay | Admitting: Physician Assistant

## 2017-02-23 ENCOUNTER — Ambulatory Visit (INDEPENDENT_AMBULATORY_CARE_PROVIDER_SITE_OTHER): Payer: Medicare HMO | Admitting: Physician Assistant

## 2017-02-23 ENCOUNTER — Encounter (INDEPENDENT_AMBULATORY_CARE_PROVIDER_SITE_OTHER): Payer: Self-pay

## 2017-02-23 VITALS — BP 118/48 | HR 71 | Ht 59.0 in | Wt 158.4 lb

## 2017-02-23 DIAGNOSIS — I5032 Chronic diastolic (congestive) heart failure: Secondary | ICD-10-CM

## 2017-02-23 DIAGNOSIS — I350 Nonrheumatic aortic (valve) stenosis: Secondary | ICD-10-CM

## 2017-02-23 DIAGNOSIS — I10 Essential (primary) hypertension: Secondary | ICD-10-CM | POA: Diagnosis not present

## 2017-02-23 NOTE — Progress Notes (Signed)
Cardiology Office Note    Date:  02/23/2017   ID:  Brandy [name], DOB 11/26/1932, MRN [id number]  PCP:  Kathyrn Lass, MD  Cardiologist: Dr. Meda Coffee  Chief Complaint  Patient presents with  . Follow-up    History of Present Illness:  Brandy [name] is a 83 y.o. female with history of HTN, HLD invasive ductal carcinoma of her left breast 2013. Saw Dr. Meda Coffee 09/2016 with dyspnea on exertion and abnormal echo showing moderate aortic stenosis and mild AI. EKG was also abnormal with T-wave in inversion inferiorly which more pronounced from prior EKGs. Repeat 2-D echo 10/05/16 showed moderate aortic stenosis LVEF 60-65%. Medical therapy was recommended.If severe AS, TAVR referral was recommended  Patient was hospitalized in May 2018 with incarcerated incisional hernia status post resection and repair. She had a Lexi scan Myoview 11/17/16 preop that was normal without ischemia, LVEF 71%. Her Diovan/HCTZ and spironolactone were held at discharge because of low blood pressures. Spironolactone has since been restarted at 25 mg once daily. She also suffered from pneumonia and asthma after hospitalization.   I saw the patient 01/19/17 complaining of dyspnea on exertion with little activity. Her blood pressure remained low. She had lost 17 pounds since surgery. She had acute on chronic diastolic CHF with edema on exam. I increased her Norvasc to 5 mg once daily and added back HCTZ 25 mg once a day. I did not restart Diovan. BUN and creatinine were 21/0.95, BNP 247.  Patient comes in today once again accompanied by her husband. She is feeling better. Her blood pressure today and her edema has resolved. She is getting a little stronger. She still has some dyspnea on exertion with activity and denies any chest pain, palpitations or dizziness or edema. She watches her sodium closely.   Past Medical History:  Diagnosis Date  . Anemia   . Arthritis    shoulders  . Asthma    mild, no inhalers used  . Breast  cancer ([place]) 12/18/11   left breast masectomy=metastatic ca in (1/1) lymph node ,invasive ductal ca,2 foci,,dcis,lymph ovascular invasion identified,surgical resection margins neg for ca,additional tissue=benign skin and subcutaneous tissue  . Bronchitis    hx of;last time >57yr ago  . Depression    takes Celexa daily  . Difficult intubation   . Dysphagia   . Full dentures   . Gall stones 2015  . GERD (gastroesophageal reflux disease)    takes Prilosec prn  . H/O hiatal hernia   . History of kidney stones    many yrs ago  . Hx of radiation therapy 04/26/12 -06/10/12   left breast  . Hyperlipidemia    takes Simvastatin daily  . Hypertension    takes Amlodipine and Diovan daily  . Insomnia    takes Ambien nightly  . Urinary urgency     Past Surgical History:  Procedure Laterality Date  . APPENDECTOMY    . bladder tack    . BREAST BIOPSY  1998   left   . DILATION AND CURETTAGE OF UTERUS    . ENDOSCOPIC RETROGRADE CHOLANGIOPANCREATOGRAPHY (ERCP) WITH PROPOFOL N/A 02/01/2014   Procedure: ENDOSCOPIC RETROGRADE CHOLANGIOPANCREATOGRAPHY (ERCP) WITH PROPOFOL;  Surgeon: [name], MD;  Location: [facility];  Service: Endoscopy;  Laterality: N/A;  . ESOPHAGOGASTRODUODENOSCOPY    . ESOPHAGOGASTRODUODENOSCOPY N/A 06/26/2016   Procedure: ESOPHAGOGASTRODUODENOSCOPY (EGD);  Surgeon: [name], MD;  Location: [place];  Service: Endoscopy;  Laterality: N/A;  . ESOPHAGOGASTRODUODENOSCOPY (EGD) WITH PROPOFOL  12/19/2013  Procedure: ESOPHAGOGASTRODUODENOSCOPY (EGD) WITH PROPOFOL;  Surgeon: Beryle Beams, MD;  Location: [facility] ENDOSCOPY;  Service: Endoscopy;;  . ESOPHAGOGASTRODUODENOSCOPY (EGD) WITH PROPOFOL N/A 09/10/2016   Procedure: ESOPHAGOGASTRODUODENOSCOPY (EGD) WITH PROPOFOL;  Surgeon: [name], MD;  Location: [facility];  Service: Endoscopy;  Laterality: N/A;  . EUS  06/04/2011   Procedure: UPPER ENDOSCOPIC ULTRASOUND (EUS) LINEAR;  Surgeon: Owens Loffler, MD;   Location: [facility];  Service: Endoscopy;  Laterality: N/A;  . EUS N/A 02/01/2014   Procedure: UPPER ENDOSCOPIC ULTRASOUND (EUS) LINEAR;  Surgeon: [name], MD;  Location: [facility];  Service: Endoscopy;  Laterality: N/A;  . EXPLORATORY LAPAROTOMY      biopsy of intra-abdominal mass  . LAPAROTOMY N/A 11/17/2016   Procedure: EXPLORATORY LAPAROTOMY WITH LYSIS OF ADHESIONS, SMALL BOWEL RESECTION, REPAIR OF INCARCERATED VENTRAL INCISIONAL HERNIA, INSERTION OF BIOLOGIC MESH PATCH,APPLICATION OF WOUND VAC DRESSING;  Surgeon: Armandina Gemma, MD;  Location: [facility];  Service: General;  Laterality: N/A;  . MASTECTOMY W/ SENTINEL NODE BIOPSY  12/18/2011   Procedure: MASTECTOMY WITH SENTINEL LYMPH NODE BIOPSY;  Surgeon: Rolm Bookbinder, MD;  Location: [place];  Service: General;  Laterality: Left;  . PORT-A-CATH REMOVAL Right 06/15/2013   Procedure: REMOVAL PORT-A-CATH;  Surgeon: Rolm Bookbinder, MD;  Location: [place];  Service: General;  Laterality: Right;  . PORTACATH PLACEMENT  01/27/2012   Procedure: INSERTION PORT-A-CATH;  Surgeon: Rolm Bookbinder, MD;  Location: [place];  Service: General;  Laterality: Right;  PORT PLACEMENT  . TOTAL SHOULDER REPLACEMENT  2011   left  . TUBAL LIGATION      Current Medications: Current Meds  Medication Sig  . acetaminophen (TYLENOL) 500 MG tablet Take 1,000 mg by mouth every 6 (six) hours as needed (pain).  . Amino Acids-Protein Hydrolys (FEEDING SUPPLEMENT, PRO-STAT SUGAR FREE 64,) LIQD Take 30 mLs by mouth 2 (two) times daily.  Marland Kitchen amLODipine (NORVASC) 5 MG tablet Take 1 tablet (5 mg total) by mouth daily.  Marland Kitchen atorvastatin (LIPITOR) 40 MG tablet Take 1 tablet by mouth daily.  . citalopram (CELEXA) 40 MG tablet Take 1 tablet by mouth daily.  Marland Kitchen docusate sodium (COLACE) 100 MG capsule Take 1 capsule (100 mg total) by mouth daily.  . ferrous sulfate 325 (65 FE) MG tablet Take 325 mg by mouth daily with breakfast.  .  gabapentin (NEURONTIN) 300 MG capsule Take 1 capsule (300 mg total) by mouth at bedtime.  . hydrochlorothiazide (HYDRODIURIL) 25 MG tablet Take 1 tablet (25 mg total) by mouth daily.  Marland Kitchen letrozole (FEMARA) 2.5 MG tablet Take 1 tablet (2.5 mg total) by mouth daily.  Marland Kitchen omeprazole (PRILOSEC) 40 MG capsule Take 40 mg by mouth daily.  . ondansetron (ZOFRAN) 4 MG tablet Take 4 mg by mouth daily.  Marland Kitchen oxyCODONE (OXY IR/ROXICODONE) 5 MG immediate release tablet Take 1 tablet (5 mg total) by mouth every 4 (four) hours as needed for severe pain or breakthrough pain.  . Saccharomyces boulardii (FLORASTOR PO) Take by mouth. Give one capsule two times a day for Prophylaxis  . spironolactone (ALDACTONE) 25 MG tablet Take 25 mg by mouth every morning.  . Vitamin D, Ergocalciferol, (DRISDOL) 50000 units CAPS capsule Take 50,000 Units by mouth every 7 (seven) days. Every Friday     Allergies:   Patient has no known allergies.   Social History   Social History  . Marital status: Married    Spouse name: N/A  . Number of children: N/A  . Years  of education: N/A   Occupational History  . retired    Social History Main Topics  . Smoking status: Never Smoker  . Smokeless tobacco: Never Used  . Alcohol use No  . Drug use: No  . Sexual activity: Yes    Birth control/ protection: Surgical     Comment: mensus age 63, 86st pregnancy 33, no hrt gg4,p3, 1 babay lived a few hours complications   Other Topics Concern  . None   Social History Narrative  . None     Family History:  The patient's family history includes Breast cancer in her other; Prostate cancer in her father.   ROS:   Please see the history of present illness.    Review of Systems  Constitution: Positive for weakness.  HENT: Negative.   Eyes: Negative.   Cardiovascular: Positive for dyspnea on exertion.  Respiratory: Negative.   Hematologic/Lymphatic: Negative.   Musculoskeletal: Negative.  Negative for joint pain.  Gastrointestinal:  Negative.   Genitourinary: Negative.    All other systems reviewed and are negative.   PHYSICAL EXAM:   VS:  BP (!) 118/48   Pulse 71   Ht 4\' 11"  (1.499 m)   Wt 158 lb 6.4 oz (71.8 kg)   SpO2 93%   BMI 31.99 kg/m   Physical Exam  GEN: Well nourished, well developed, in no acute distress  Neck: no JVD, carotid bruits, or masses Cardiac:RRR; 3/6 harsh systolic murmur at the left sternal border Respiratory:  clear to auscultation bilaterally, normal work of breathing GI: soft, nontender, nondistended, + BS Ext: without cyanosis, clubbing, or edema, Good distal pulses bilaterally Neuro:  Alert and Oriented x 3 Psych: euthymic mood, full affect  Wt Readings from Last 3 Encounters:  02/23/17 158 lb 6.4 oz (71.8 kg)  01/19/17 159 lb 3.2 oz (72.2 kg)  12/11/16 165 lb 12.8 oz (75.2 kg)      Studies/Labs Reviewed:   EKG:  EKG is notordered today.    Recent Labs: 11/22/2016: Magnesium 1.9 11/30/2016: Hemoglobin 8.7; Platelets 382 01/14/2017: ALT 8 01/19/2017: BUN 21; Creatinine, Ser 0.95; NT-Pro BNP 247; Potassium 4.2; Sodium 142   Lipid Panel No results found for: CHOL, TRIG, HDL, CHOLHDL, VLDL, LDLCALC, LDLDIRECT  Additional studies/ records that were reviewed today include:    Lexi scan Myoview 11/17/16   IMPRESSION: 1. Suspect inferior wall scar/infarction. No findings for ischemia.   2. Normal left ventricular wall motion except for the inferior wall.   3. Left ventricular ejection fraction 71%   4. Non invasive risk stratification*: Intermediate     2-D echo 4/2/18Study Conclusions   - Left ventricle: The cavity size was normal. Wall thickness was   normal. Systolic function was normal. The estimated ejection   fraction was in the range of 60% to 65%. Wall motion was normal;   there were no regional wall motion abnormalities. Doppler   parameters are consistent with abnormal left ventricular   relaxation (grade 1 diastolic dysfunction). Doppler parameters   are  consistent with elevated mean left atrial filling pressure. - Aortic valve: Valve mobility was moderately restricted. There was   moderate stenosis. There was trivial regurgitation. Mean gradient   (S): 25 mm Hg. Peak gradient (S): 43 mm Hg. Valve area (VTI):   1.37 cm^2. Valve area (Vmax): 1.39 cm^2. Valve area (Vmean): 1.35   cm^2. - Mitral valve: Moderately to severely calcified annulus. Mildly   thickened leaflets . Mean gradient (D): 3 mm Hg. Peak gradient   (  D): 9 mm Hg. Valve area by pressure half-time: 2.34 cm^2. Valve   area by continuity equation (using LVOT flow): 2.64 cm^2. - Left atrium: The atrium was mildly dilated. - Atrial septum: No defect or patent foramen ovale was identified.   Impressions:   - The degree of aortic stenosis appears unchanged compared to the   last study (gradients may have been slightly overestimated on the   previous study due to the irregular rhythm).   TTE: 06/15/2016   - Left ventricle: The cavity size was normal. There was mild   concentric hypertrophy. Systolic function was vigorous. The   estimated ejection fraction was in the range of 65% to 70%. Wall   motion was normal; there were no regional wall motion   abnormalities. Doppler parameters are consistent with abnormal   left ventricular relaxation (grade 1 diastolic dysfunction).   Doppler parameters are consistent with elevated ventricular   end-diastolic filling pressure. - Aortic valve: Valve mobility was restricted. There was moderate   stenosis. There was mild regurgitation. Mean gradient (S): 32 mm   Hg. Peak gradient (S): 59 mm Hg. Valve area (VTI): 1.08 cm^2.   Valve area (Vmax): 1.01 cm^2. Valve area (Vmean): 1 cm^2. - Aortic root: The aortic root was normal in size. - Mitral valve: Calcified annulus. Valve area by pressure   half-time: 1.96 cm^2. Valve area by continuity equation (using   LVOT flow): 2 cm^2. - Left atrium: The atrium was at the upper limits of normal in    size. - Right ventricle: Systolic function was normal. - Right atrium: The atrium was normal in size. - Tricuspid valve: There was mild regurgitation. - Pulmonary arteries: Systolic pressure was mildly increased. PA   peak pressure: 32 mm Hg (S). - Inferior vena cava: The vessel was normal in size. The   respirophasic diameter changes were in the normal range (= 50%),   consistent with normal central venous pressure. - Pericardium, extracardiac: There was no pericardial effusion.   Impressions: - When compared to the prior study from 04/19/2013, aortic stenosis   is now moderate, there is mild aortic regurgitation.             ASSESSMENT:    1. Chronic diastolic CHF (congestive heart failure) ([place])   2. Essential hypertension   3. Moderate aortic stenosis      PLAN:  In order of problems listed above:  Chronic diastolic CHF improved with HCTZ and spironolactone. Check renal function today. We'll not restart Diovan at this time because of recent low blood pressures. Follow-up with Dr. Meda Coffee in 4 months.  Moderate aortic stenosis stable on echo 10/2016. Lexi scan Myoview prior to surgery 11/17/16 was normal without ischemia. Will need repeat echo 10/2017.   Essential hypertension patient's blood pressure is much better today. No longer hypotensive. Continue amlodipine and HCTZ    Medication Adjustments/Labs and Tests Ordered: Current medicines are reviewed at length with the patient today.  Concerns regarding medicines are outlined above.  Medication changes, Labs and Tests ordered today are listed in the Patient Instructions below. Patient Instructions  Medication Instructions:  Your physician recommends that you continue on your current medications as directed. Please refer to the Current Medication list given to you today.   Labwork: TODAY: BMET  Testing/Procedures: None  Follow-Up: It is recommended that you schedule a follow-up appointment in: 4 months with Dr.  Meda Coffee.   Any Other Special Instructions Will Be Listed Below (If Applicable).  If you need a refill on your cardiac medications before your next appointment, please call your pharmacy.      Signed, Ermalinda Barrios, PA-C  02/23/2017 1:39 PM    [facility] [place], [place], [place]  [address] Phone: [phone]; Fax: [phone]

## 2017-02-23 NOTE — Patient Instructions (Signed)
Medication Instructions:  Your physician recommends that you continue on your current medications as directed. Please refer to the Current Medication list given to you today.   Labwork: TODAY: BMET  Testing/Procedures: None  Follow-Up: It is recommended that you schedule a follow-up appointment in: 4 months with Dr. Meda Coffee.   Any Other Special Instructions Will Be Listed Below (If Applicable).     If you need a refill on your cardiac medications before your next appointment, please call your pharmacy.

## 2017-02-24 LAB — BASIC METABOLIC PANEL
BUN/Creatinine Ratio: 28 (ref 12–28)
BUN: 20 mg/dL (ref 8–27)
CALCIUM: 9 mg/dL (ref 8.7–10.3)
CHLORIDE: 105 mmol/L (ref 96–106)
CO2: 20 mmol/L (ref 20–29)
Creatinine, Ser: 0.71 mg/dL (ref 0.57–1.00)
GFR calc non Af Amer: 80 mL/min/{1.73_m2} (ref >59)
GFR, EST AFRICAN AMERICAN: 92 mL/min/{1.73_m2} (ref >59)
Glucose: 102 mg/dL — ABNORMAL HIGH (ref 65–99)
POTASSIUM: 4.9 mmol/L (ref 3.5–5.2)
Sodium: 139 mmol/L (ref 134–144)

## 2017-02-25 NOTE — Addendum Note (Signed)
Addendum  created 02/25/17 1437 by Roberts Gaudy, MD   Sign clinical note

## 2017-03-02 DIAGNOSIS — R69 Illness, unspecified: Secondary | ICD-10-CM | POA: Diagnosis not present

## 2017-03-02 DIAGNOSIS — M1991 Primary osteoarthritis, unspecified site: Secondary | ICD-10-CM | POA: Diagnosis not present

## 2017-03-02 DIAGNOSIS — D500 Iron deficiency anemia secondary to blood loss (chronic): Secondary | ICD-10-CM | POA: Diagnosis not present

## 2017-03-02 DIAGNOSIS — Z48815 Encounter for surgical aftercare following surgery on the digestive system: Secondary | ICD-10-CM | POA: Diagnosis not present

## 2017-03-02 DIAGNOSIS — M47817 Spondylosis without myelopathy or radiculopathy, lumbosacral region: Secondary | ICD-10-CM | POA: Diagnosis not present

## 2017-03-02 DIAGNOSIS — M5030 Other cervical disc degeneration, unspecified cervical region: Secondary | ICD-10-CM | POA: Diagnosis not present

## 2017-03-02 DIAGNOSIS — I10 Essential (primary) hypertension: Secondary | ICD-10-CM | POA: Diagnosis not present

## 2017-03-02 DIAGNOSIS — K253 Acute gastric ulcer without hemorrhage or perforation: Secondary | ICD-10-CM | POA: Diagnosis not present

## 2017-03-02 DIAGNOSIS — E7800 Pure hypercholesterolemia, unspecified: Secondary | ICD-10-CM | POA: Diagnosis not present

## 2017-03-02 DIAGNOSIS — J4541 Moderate persistent asthma with (acute) exacerbation: Secondary | ICD-10-CM | POA: Diagnosis not present

## 2017-03-09 DIAGNOSIS — M5030 Other cervical disc degeneration, unspecified cervical region: Secondary | ICD-10-CM | POA: Diagnosis not present

## 2017-03-09 DIAGNOSIS — K253 Acute gastric ulcer without hemorrhage or perforation: Secondary | ICD-10-CM | POA: Diagnosis not present

## 2017-03-09 DIAGNOSIS — M1991 Primary osteoarthritis, unspecified site: Secondary | ICD-10-CM | POA: Diagnosis not present

## 2017-03-09 DIAGNOSIS — Z48815 Encounter for surgical aftercare following surgery on the digestive system: Secondary | ICD-10-CM | POA: Diagnosis not present

## 2017-03-09 DIAGNOSIS — M47817 Spondylosis without myelopathy or radiculopathy, lumbosacral region: Secondary | ICD-10-CM | POA: Diagnosis not present

## 2017-03-09 DIAGNOSIS — J4541 Moderate persistent asthma with (acute) exacerbation: Secondary | ICD-10-CM | POA: Diagnosis not present

## 2017-03-09 DIAGNOSIS — I10 Essential (primary) hypertension: Secondary | ICD-10-CM | POA: Diagnosis not present

## 2017-03-09 DIAGNOSIS — E7800 Pure hypercholesterolemia, unspecified: Secondary | ICD-10-CM | POA: Diagnosis not present

## 2017-03-09 DIAGNOSIS — D500 Iron deficiency anemia secondary to blood loss (chronic): Secondary | ICD-10-CM | POA: Diagnosis not present

## 2017-03-09 DIAGNOSIS — R69 Illness, unspecified: Secondary | ICD-10-CM | POA: Diagnosis not present

## 2017-03-10 DIAGNOSIS — Z48815 Encounter for surgical aftercare following surgery on the digestive system: Secondary | ICD-10-CM | POA: Diagnosis not present

## 2017-03-16 DIAGNOSIS — M5030 Other cervical disc degeneration, unspecified cervical region: Secondary | ICD-10-CM | POA: Diagnosis not present

## 2017-03-16 DIAGNOSIS — R69 Illness, unspecified: Secondary | ICD-10-CM | POA: Diagnosis not present

## 2017-03-16 DIAGNOSIS — E7800 Pure hypercholesterolemia, unspecified: Secondary | ICD-10-CM | POA: Diagnosis not present

## 2017-03-16 DIAGNOSIS — M1991 Primary osteoarthritis, unspecified site: Secondary | ICD-10-CM | POA: Diagnosis not present

## 2017-03-16 DIAGNOSIS — J4541 Moderate persistent asthma with (acute) exacerbation: Secondary | ICD-10-CM | POA: Diagnosis not present

## 2017-03-16 DIAGNOSIS — D500 Iron deficiency anemia secondary to blood loss (chronic): Secondary | ICD-10-CM | POA: Diagnosis not present

## 2017-03-16 DIAGNOSIS — I10 Essential (primary) hypertension: Secondary | ICD-10-CM | POA: Diagnosis not present

## 2017-03-16 DIAGNOSIS — M47817 Spondylosis without myelopathy or radiculopathy, lumbosacral region: Secondary | ICD-10-CM | POA: Diagnosis not present

## 2017-03-16 DIAGNOSIS — K253 Acute gastric ulcer without hemorrhage or perforation: Secondary | ICD-10-CM | POA: Diagnosis not present

## 2017-03-16 DIAGNOSIS — Z48815 Encounter for surgical aftercare following surgery on the digestive system: Secondary | ICD-10-CM | POA: Diagnosis not present

## 2017-03-22 DIAGNOSIS — D500 Iron deficiency anemia secondary to blood loss (chronic): Secondary | ICD-10-CM | POA: Diagnosis not present

## 2017-03-22 DIAGNOSIS — M5030 Other cervical disc degeneration, unspecified cervical region: Secondary | ICD-10-CM | POA: Diagnosis not present

## 2017-03-22 DIAGNOSIS — K253 Acute gastric ulcer without hemorrhage or perforation: Secondary | ICD-10-CM | POA: Diagnosis not present

## 2017-03-22 DIAGNOSIS — M47817 Spondylosis without myelopathy or radiculopathy, lumbosacral region: Secondary | ICD-10-CM | POA: Diagnosis not present

## 2017-03-22 DIAGNOSIS — Z48815 Encounter for surgical aftercare following surgery on the digestive system: Secondary | ICD-10-CM | POA: Diagnosis not present

## 2017-03-22 DIAGNOSIS — I10 Essential (primary) hypertension: Secondary | ICD-10-CM | POA: Diagnosis not present

## 2017-03-22 DIAGNOSIS — R69 Illness, unspecified: Secondary | ICD-10-CM | POA: Diagnosis not present

## 2017-03-22 DIAGNOSIS — E7800 Pure hypercholesterolemia, unspecified: Secondary | ICD-10-CM | POA: Diagnosis not present

## 2017-03-22 DIAGNOSIS — M1991 Primary osteoarthritis, unspecified site: Secondary | ICD-10-CM | POA: Diagnosis not present

## 2017-03-22 DIAGNOSIS — J4541 Moderate persistent asthma with (acute) exacerbation: Secondary | ICD-10-CM | POA: Diagnosis not present

## 2017-03-29 DIAGNOSIS — E7800 Pure hypercholesterolemia, unspecified: Secondary | ICD-10-CM | POA: Diagnosis not present

## 2017-03-29 DIAGNOSIS — M5030 Other cervical disc degeneration, unspecified cervical region: Secondary | ICD-10-CM | POA: Diagnosis not present

## 2017-03-29 DIAGNOSIS — I10 Essential (primary) hypertension: Secondary | ICD-10-CM | POA: Diagnosis not present

## 2017-03-29 DIAGNOSIS — Z23 Encounter for immunization: Secondary | ICD-10-CM | POA: Diagnosis not present

## 2017-03-29 DIAGNOSIS — D500 Iron deficiency anemia secondary to blood loss (chronic): Secondary | ICD-10-CM | POA: Diagnosis not present

## 2017-03-31 DIAGNOSIS — Z48815 Encounter for surgical aftercare following surgery on the digestive system: Secondary | ICD-10-CM | POA: Diagnosis not present

## 2017-03-31 DIAGNOSIS — R69 Illness, unspecified: Secondary | ICD-10-CM | POA: Diagnosis not present

## 2017-03-31 DIAGNOSIS — I10 Essential (primary) hypertension: Secondary | ICD-10-CM | POA: Diagnosis not present

## 2017-03-31 DIAGNOSIS — M5030 Other cervical disc degeneration, unspecified cervical region: Secondary | ICD-10-CM | POA: Diagnosis not present

## 2017-03-31 DIAGNOSIS — M1991 Primary osteoarthritis, unspecified site: Secondary | ICD-10-CM | POA: Diagnosis not present

## 2017-03-31 DIAGNOSIS — J4541 Moderate persistent asthma with (acute) exacerbation: Secondary | ICD-10-CM | POA: Diagnosis not present

## 2017-03-31 DIAGNOSIS — M47817 Spondylosis without myelopathy or radiculopathy, lumbosacral region: Secondary | ICD-10-CM | POA: Diagnosis not present

## 2017-03-31 DIAGNOSIS — D500 Iron deficiency anemia secondary to blood loss (chronic): Secondary | ICD-10-CM | POA: Diagnosis not present

## 2017-03-31 DIAGNOSIS — K253 Acute gastric ulcer without hemorrhage or perforation: Secondary | ICD-10-CM | POA: Diagnosis not present

## 2017-03-31 DIAGNOSIS — E7800 Pure hypercholesterolemia, unspecified: Secondary | ICD-10-CM | POA: Diagnosis not present

## 2017-04-01 ENCOUNTER — Telehealth: Payer: Self-pay | Admitting: Hematology and Oncology

## 2017-04-01 NOTE — Telephone Encounter (Signed)
Spoke with patient regarding the appt that was scheduled per 9/26 sch msg.

## 2017-04-06 ENCOUNTER — Ambulatory Visit (HOSPITAL_BASED_OUTPATIENT_CLINIC_OR_DEPARTMENT_OTHER): Payer: Medicare HMO | Admitting: Hematology and Oncology

## 2017-04-06 DIAGNOSIS — C50412 Malignant neoplasm of upper-outer quadrant of left female breast: Secondary | ICD-10-CM

## 2017-04-06 DIAGNOSIS — M8580 Other specified disorders of bone density and structure, unspecified site: Secondary | ICD-10-CM | POA: Diagnosis not present

## 2017-04-06 DIAGNOSIS — T8189XA Other complications of procedures, not elsewhere classified, initial encounter: Secondary | ICD-10-CM | POA: Diagnosis not present

## 2017-04-06 DIAGNOSIS — C773 Secondary and unspecified malignant neoplasm of axilla and upper limb lymph nodes: Secondary | ICD-10-CM | POA: Diagnosis not present

## 2017-04-06 NOTE — Assessment & Plan Note (Signed)
Left breast cancer multifocal disease 1.7 cm and 0.7 cm, one sentinel lymph node positive with focal extracapsular extension: 1.7 cm mass was ER/PR positive and HER-2 negative with a Ki-67 of 30%: The 0.7 cm mass was ER 100%, PR 7%, Ki-67 15%, HER-2 positive ratio 4.94, status post chemotherapy with Taxol Herceptin from 02/09/2012 to 04/04/2012 discontinued early due to Taxol side effects followed by Herceptin maintenance followed by radiation therapy and adjuvant letrozole was started December 2013  Letrozole toxicities: 1. Osteopenia T score -2.4 continue with Prolia with calcium and vitamin D I recommended a total of 7 years of antiestrogen therapy.  Breast Cancer Surveillance: 1. Breast exam 04/06/2017: Normal 2. Mammogram 01/08/2014 No abnormalities. Postsurgical changes. Breast Density Category A.   Return to clinic in 1 year for follow-up

## 2017-04-06 NOTE — Progress Notes (Signed)
Patient Care Team: Kathyrn Lass, MD as PCP - General  DIAGNOSIS:  Encounter Diagnosis  Name Primary?  . Primary cancer of upper outer quadrant of left female breast ([place])     SUMMARY OF ONCOLOGIC HISTORY:   Primary cancer of upper outer quadrant of left female breast ([place])   12/18/2011 Surgery    Left mastectomy: 2 foci of well-differentiated invasive ductal carcinoma 1.7 cm, 0.7 cm with DCIS, lymphovascular invasion present, one positive SLN with ECS, ER/PR positive, HER-2 negative, Ki-67 13%; 0.7 cm tumor: ER 100, PR 7% Her 2Pos 4.94, Ki 50%      02/09/2012 - 02/06/2013 Chemotherapy    Adjuvant chemotherapy Taxol Herceptin X 10 discontinued due to intolerance followed by Herceptin maintenance      04/26/2012 - 06/10/2012 Radiation Therapy    Adjuvant radiation therapy      06/10/2012 - 04/06/2017 Anti-estrogen oral therapy    Letrozole 2.5 mg daily      11/15/2016 - 11/25/2016 Hospital Admission    Incarcerated incisional hernia status post small bowel resection and repair 11/17/2016       CHIEF COMPLIANT: Follow-up on letrozole therapy  INTERVAL HISTORY: Brandy [name] is a 83 year old with above-mentioned history of left breast cancer treated with mastectomy followed by adjuvant chemotherapy and radiation and is currently on letrozole. She has tolerated it fairly well and completed 5 years of therapy. She had a separate abdominal surgery in May 2018 and still has a nonhealing abdominal wall wound. She is getting wound dressings twice a day. She denies any lumps or nodules in the right breast. Left chest wall is without any nodules.  REVIEW OF SYSTEMS:   Constitutional: Denies fevers, chills or abnormal weight loss Eyes: Denies blurriness of vision Ears, nose, mouth, throat, and face: Denies mucositis or sore throat Respiratory: Denies cough, dyspnea or wheezes Cardiovascular: Denies palpitation, chest discomfort Gastrointestinal:  Abdominal wall wound not healing Skin:  Denies abnormal skin rashes Lymphatics: Denies new lymphadenopathy or easy bruising Neurological:Denies numbness, tingling or new weaknesses Behavioral/Psych: Mood is stable, no new changes  Extremities: No lower extremity edema Breast:  denies any pain or lumps or nodules in right breast or left chest wall All other systems were reviewed with the patient and are negative.  I have reviewed the past medical history, past surgical history, social history and family history with the patient and they are unchanged from previous note.  ALLERGIES:  has No Known Allergies.  MEDICATIONS:  Current Outpatient Prescriptions  Medication Sig Dispense Refill  . acetaminophen (TYLENOL) 500 MG tablet Take 1,000 mg by mouth every 6 (six) hours as needed (pain).    . Amino Acids-Protein Hydrolys (FEEDING SUPPLEMENT, PRO-STAT SUGAR FREE 64,) LIQD Take 30 mLs by mouth 2 (two) times daily.    Marland Kitchen amLODipine (NORVASC) 5 MG tablet Take 1 tablet (5 mg total) by mouth daily. 90 tablet 3  . atorvastatin (LIPITOR) 40 MG tablet Take 1 tablet by mouth daily.    . citalopram (CELEXA) 40 MG tablet Take 1 tablet by mouth daily.    Marland Kitchen docusate sodium (COLACE) 100 MG capsule Take 1 capsule (100 mg total) by mouth daily. 10 capsule 0  . ferrous sulfate 325 (65 FE) MG tablet Take 325 mg by mouth daily with breakfast.    . gabapentin (NEURONTIN) 300 MG capsule Take 1 capsule (300 mg total) by mouth at bedtime. 90 capsule 3  . hydrochlorothiazide (HYDRODIURIL) 25 MG tablet Take 1 tablet (25 mg total) by mouth daily.  90 tablet 3  . letrozole (FEMARA) 2.5 MG tablet Take 1 tablet (2.5 mg total) by mouth daily. 90 tablet 3  . omeprazole (PRILOSEC) 40 MG capsule Take 40 mg by mouth daily.    . ondansetron (ZOFRAN) 4 MG tablet Take 4 mg by mouth daily.    . Saccharomyces boulardii (FLORASTOR PO) Take by mouth. Give one capsule two times a day for Prophylaxis    . spironolactone (ALDACTONE) 25 MG tablet Take 25 mg by mouth every  morning.    . Vitamin D, Ergocalciferol, (DRISDOL) 50000 units CAPS capsule Take 50,000 Units by mouth every 7 (seven) days. Every Friday     No current facility-administered medications for this visit.    Facility-Administered Medications Ordered in Other Visits  Medication Dose Route Frequency Provider Last Rate Last Dose  . sodium chloride 0.9 % injection 10 mL  10 mL Intracatheter PRN Marcy Panning, MD   10 mL at 03/14/12 1628    PHYSICAL EXAMINATION: ECOG PERFORMANCE STATUS: 2 - Symptomatic, <50% confined to bed  Vitals:   04/06/17 1532  BP: (!) 145/66  Pulse: 89  Resp: 18  Temp: 98.2 F (36.8 C)  SpO2: 97%   Filed Weights   04/06/17 1532  Weight: 158 lb (71.7 kg)    GENERAL:alert, no distress and comfortable SKIN: skin color, texture, turgor are normal, no rashes or significant lesions EYES: normal, Conjunctiva are pink and non-injected, sclera clear OROPHARYNX:no exudate, no erythema and lips, buccal mucosa, and tongue normal  NECK: supple, thyroid normal size, non-tender, without nodularity LYMPH:  no palpable lymphadenopathy in the cervical, axillary or inguinal LUNGS: clear to auscultation and percussion with normal breathing effort HEART: regular rate & rhythm and no murmurs and no lower extremity edema ABDOMEN:abdomen soft, non-tender and normal bowel sounds MUSCULOSKELETAL:no cyanosis of digits and no clubbing  NEURO: alert & oriented x 3 with fluent speech, no focal motor/sensory deficits EXTREMITIES: No lower extremity edema BREAST: No palpable masses or nodules in either right breast and left chest wall. No palpable axillary supraclavicular or infraclavicular adenopathy no breast tenderness or nipple discharge. (exam performed in the presence of a chaperone)  LABORATORY DATA:  I have reviewed the data as listed   Chemistry      Component Value Date/Time   NA 139 02/23/2017 1334   NA 137 01/14/2017 1403   K 4.9 02/23/2017 1334   K 4.3 01/14/2017 1403    CL 105 02/23/2017 1334   CL 108 (H) 12/28/2012 1343   CO2 20 02/23/2017 1334   CO2 22 01/14/2017 1403   BUN 20 02/23/2017 1334   BUN 32.1 (H) 01/14/2017 1403   CREATININE 0.71 02/23/2017 1334   CREATININE 1.1 01/14/2017 1403   GLU 99 12/10/2016      Component Value Date/Time   CALCIUM 9.0 02/23/2017 1334   CALCIUM 10.0 01/14/2017 1403   ALKPHOS 84 01/14/2017 1403   AST 8 01/14/2017 1403   ALT 8 01/14/2017 1403   BILITOT 0.23 01/14/2017 1403       Lab Results  Component Value Date   WBC 9.4 11/30/2016   HGB 8.7 (A) 11/30/2016   HCT 26 (A) 11/30/2016   MCV 88.2 11/25/2016   PLT 382 11/30/2016   NEUTROABS 6.6 11/22/2016    ASSESSMENT & PLAN:  Primary cancer of upper outer quadrant of left female breast Left breast cancer multifocal disease 1.7 cm and 0.7 cm, one sentinel lymph node positive with focal extracapsular extension: 1.7 cm mass was ER/PR  positive and HER-2 negative with a Ki-67 of 30%: The 0.7 cm mass was ER 100%, PR 7%, Ki-67 15%, HER-2 positive ratio 4.94, status post chemotherapy with Taxol Herceptin from 02/09/2012 to 04/04/2012 discontinued early due to Taxol side effects followed by Herceptin maintenance followed by radiation therapy and adjuvant letrozole was started December 2013  Letrozole toxicities: 1. Osteopenia T score -2.4 continue with Prolia with calcium and vitamin D Repeat bone density 2018: T score -1.9 I recommended that we do Prolia shot today and then discontinue further Prolia injections   Breast Cancer Surveillance: 1. Breast exam 04/06/2017: Normal 2. Mammogram 01/09/2016 No abnormalities. Postsurgical changes. Breast Density Category A.  She did not get mammogram this year because of nonhealing abdominal wall wound She plans to get it next year.  Return to clinic in 1 year for follow-up with survivorship clinic   I spent 25 minutes talking to the patient of which more than half was spent in counseling and coordination of care.  No  orders of the defined types were placed in this encounter.  The patient has a good understanding of the overall plan. she agrees with it. she will call with any problems that may develop before the next visit here.   Rulon Eisenmenger, MD 04/06/17

## 2017-04-07 ENCOUNTER — Telehealth: Payer: Self-pay | Admitting: Adult Health

## 2017-04-07 DIAGNOSIS — E7800 Pure hypercholesterolemia, unspecified: Secondary | ICD-10-CM | POA: Diagnosis not present

## 2017-04-07 DIAGNOSIS — M1991 Primary osteoarthritis, unspecified site: Secondary | ICD-10-CM | POA: Diagnosis not present

## 2017-04-07 DIAGNOSIS — R69 Illness, unspecified: Secondary | ICD-10-CM | POA: Diagnosis not present

## 2017-04-07 DIAGNOSIS — M5030 Other cervical disc degeneration, unspecified cervical region: Secondary | ICD-10-CM | POA: Diagnosis not present

## 2017-04-07 DIAGNOSIS — M47817 Spondylosis without myelopathy or radiculopathy, lumbosacral region: Secondary | ICD-10-CM | POA: Diagnosis not present

## 2017-04-07 DIAGNOSIS — D500 Iron deficiency anemia secondary to blood loss (chronic): Secondary | ICD-10-CM | POA: Diagnosis not present

## 2017-04-07 DIAGNOSIS — J4541 Moderate persistent asthma with (acute) exacerbation: Secondary | ICD-10-CM | POA: Diagnosis not present

## 2017-04-07 DIAGNOSIS — I10 Essential (primary) hypertension: Secondary | ICD-10-CM | POA: Diagnosis not present

## 2017-04-07 DIAGNOSIS — K253 Acute gastric ulcer without hemorrhage or perforation: Secondary | ICD-10-CM | POA: Diagnosis not present

## 2017-04-07 DIAGNOSIS — Z48815 Encounter for surgical aftercare following surgery on the digestive system: Secondary | ICD-10-CM | POA: Diagnosis not present

## 2017-04-07 NOTE — Telephone Encounter (Signed)
Mailed calendar of October 2019 appointment.

## 2017-04-13 DIAGNOSIS — M5030 Other cervical disc degeneration, unspecified cervical region: Secondary | ICD-10-CM | POA: Diagnosis not present

## 2017-04-13 DIAGNOSIS — K253 Acute gastric ulcer without hemorrhage or perforation: Secondary | ICD-10-CM | POA: Diagnosis not present

## 2017-04-13 DIAGNOSIS — E7800 Pure hypercholesterolemia, unspecified: Secondary | ICD-10-CM | POA: Diagnosis not present

## 2017-04-13 DIAGNOSIS — J4541 Moderate persistent asthma with (acute) exacerbation: Secondary | ICD-10-CM | POA: Diagnosis not present

## 2017-04-13 DIAGNOSIS — D500 Iron deficiency anemia secondary to blood loss (chronic): Secondary | ICD-10-CM | POA: Diagnosis not present

## 2017-04-13 DIAGNOSIS — M47817 Spondylosis without myelopathy or radiculopathy, lumbosacral region: Secondary | ICD-10-CM | POA: Diagnosis not present

## 2017-04-13 DIAGNOSIS — I10 Essential (primary) hypertension: Secondary | ICD-10-CM | POA: Diagnosis not present

## 2017-04-13 DIAGNOSIS — R69 Illness, unspecified: Secondary | ICD-10-CM | POA: Diagnosis not present

## 2017-04-13 DIAGNOSIS — M1991 Primary osteoarthritis, unspecified site: Secondary | ICD-10-CM | POA: Diagnosis not present

## 2017-04-13 DIAGNOSIS — Z48815 Encounter for surgical aftercare following surgery on the digestive system: Secondary | ICD-10-CM | POA: Diagnosis not present

## 2017-04-16 DIAGNOSIS — J4540 Moderate persistent asthma, uncomplicated: Secondary | ICD-10-CM | POA: Diagnosis not present

## 2017-04-16 DIAGNOSIS — M1991 Primary osteoarthritis, unspecified site: Secondary | ICD-10-CM | POA: Diagnosis not present

## 2017-04-16 DIAGNOSIS — I10 Essential (primary) hypertension: Secondary | ICD-10-CM | POA: Diagnosis not present

## 2017-04-16 DIAGNOSIS — M5030 Other cervical disc degeneration, unspecified cervical region: Secondary | ICD-10-CM | POA: Diagnosis not present

## 2017-04-16 DIAGNOSIS — Z48815 Encounter for surgical aftercare following surgery on the digestive system: Secondary | ICD-10-CM | POA: Diagnosis not present

## 2017-04-16 DIAGNOSIS — M47817 Spondylosis without myelopathy or radiculopathy, lumbosacral region: Secondary | ICD-10-CM | POA: Diagnosis not present

## 2017-04-16 DIAGNOSIS — R69 Illness, unspecified: Secondary | ICD-10-CM | POA: Diagnosis not present

## 2017-04-16 DIAGNOSIS — D510 Vitamin B12 deficiency anemia due to intrinsic factor deficiency: Secondary | ICD-10-CM | POA: Diagnosis not present

## 2017-04-17 DIAGNOSIS — M50822 Other cervical disc disorders at C5-C6 level: Secondary | ICD-10-CM | POA: Diagnosis not present

## 2017-04-17 DIAGNOSIS — M50821 Other cervical disc disorders at C4-C5 level: Secondary | ICD-10-CM | POA: Diagnosis not present

## 2017-04-17 DIAGNOSIS — M542 Cervicalgia: Secondary | ICD-10-CM | POA: Diagnosis not present

## 2017-04-17 DIAGNOSIS — M50823 Other cervical disc disorders at C6-C7 level: Secondary | ICD-10-CM | POA: Diagnosis not present

## 2017-04-19 ENCOUNTER — Encounter: Payer: Self-pay | Admitting: Hematology and Oncology

## 2017-04-20 DIAGNOSIS — I10 Essential (primary) hypertension: Secondary | ICD-10-CM | POA: Diagnosis not present

## 2017-04-20 DIAGNOSIS — D510 Vitamin B12 deficiency anemia due to intrinsic factor deficiency: Secondary | ICD-10-CM | POA: Diagnosis not present

## 2017-04-20 DIAGNOSIS — J4540 Moderate persistent asthma, uncomplicated: Secondary | ICD-10-CM | POA: Diagnosis not present

## 2017-04-20 DIAGNOSIS — Z48815 Encounter for surgical aftercare following surgery on the digestive system: Secondary | ICD-10-CM | POA: Diagnosis not present

## 2017-04-20 DIAGNOSIS — M1991 Primary osteoarthritis, unspecified site: Secondary | ICD-10-CM | POA: Diagnosis not present

## 2017-04-20 DIAGNOSIS — M5030 Other cervical disc degeneration, unspecified cervical region: Secondary | ICD-10-CM | POA: Diagnosis not present

## 2017-04-20 DIAGNOSIS — R69 Illness, unspecified: Secondary | ICD-10-CM | POA: Diagnosis not present

## 2017-04-20 DIAGNOSIS — M47817 Spondylosis without myelopathy or radiculopathy, lumbosacral region: Secondary | ICD-10-CM | POA: Diagnosis not present

## 2017-04-24 DIAGNOSIS — M542 Cervicalgia: Secondary | ICD-10-CM | POA: Diagnosis not present

## 2017-04-27 DIAGNOSIS — Z48815 Encounter for surgical aftercare following surgery on the digestive system: Secondary | ICD-10-CM | POA: Diagnosis not present

## 2017-04-27 DIAGNOSIS — M47817 Spondylosis without myelopathy or radiculopathy, lumbosacral region: Secondary | ICD-10-CM | POA: Diagnosis not present

## 2017-04-27 DIAGNOSIS — R69 Illness, unspecified: Secondary | ICD-10-CM | POA: Diagnosis not present

## 2017-04-27 DIAGNOSIS — M5030 Other cervical disc degeneration, unspecified cervical region: Secondary | ICD-10-CM | POA: Diagnosis not present

## 2017-04-27 DIAGNOSIS — D510 Vitamin B12 deficiency anemia due to intrinsic factor deficiency: Secondary | ICD-10-CM | POA: Diagnosis not present

## 2017-04-27 DIAGNOSIS — J4540 Moderate persistent asthma, uncomplicated: Secondary | ICD-10-CM | POA: Diagnosis not present

## 2017-04-27 DIAGNOSIS — M1991 Primary osteoarthritis, unspecified site: Secondary | ICD-10-CM | POA: Diagnosis not present

## 2017-04-27 DIAGNOSIS — I10 Essential (primary) hypertension: Secondary | ICD-10-CM | POA: Diagnosis not present

## 2017-05-04 DIAGNOSIS — M1991 Primary osteoarthritis, unspecified site: Secondary | ICD-10-CM | POA: Diagnosis not present

## 2017-05-04 DIAGNOSIS — M5030 Other cervical disc degeneration, unspecified cervical region: Secondary | ICD-10-CM | POA: Diagnosis not present

## 2017-05-04 DIAGNOSIS — R69 Illness, unspecified: Secondary | ICD-10-CM | POA: Diagnosis not present

## 2017-05-04 DIAGNOSIS — I10 Essential (primary) hypertension: Secondary | ICD-10-CM | POA: Diagnosis not present

## 2017-05-04 DIAGNOSIS — M47817 Spondylosis without myelopathy or radiculopathy, lumbosacral region: Secondary | ICD-10-CM | POA: Diagnosis not present

## 2017-05-04 DIAGNOSIS — J4540 Moderate persistent asthma, uncomplicated: Secondary | ICD-10-CM | POA: Diagnosis not present

## 2017-05-04 DIAGNOSIS — Z48815 Encounter for surgical aftercare following surgery on the digestive system: Secondary | ICD-10-CM | POA: Diagnosis not present

## 2017-05-04 DIAGNOSIS — D510 Vitamin B12 deficiency anemia due to intrinsic factor deficiency: Secondary | ICD-10-CM | POA: Diagnosis not present

## 2017-05-10 DIAGNOSIS — M542 Cervicalgia: Secondary | ICD-10-CM | POA: Diagnosis not present

## 2017-05-13 DIAGNOSIS — M1991 Primary osteoarthritis, unspecified site: Secondary | ICD-10-CM | POA: Diagnosis not present

## 2017-05-13 DIAGNOSIS — M47817 Spondylosis without myelopathy or radiculopathy, lumbosacral region: Secondary | ICD-10-CM | POA: Diagnosis not present

## 2017-05-13 DIAGNOSIS — M5030 Other cervical disc degeneration, unspecified cervical region: Secondary | ICD-10-CM | POA: Diagnosis not present

## 2017-05-13 DIAGNOSIS — I10 Essential (primary) hypertension: Secondary | ICD-10-CM | POA: Diagnosis not present

## 2017-05-13 DIAGNOSIS — J4540 Moderate persistent asthma, uncomplicated: Secondary | ICD-10-CM | POA: Diagnosis not present

## 2017-05-13 DIAGNOSIS — Z48815 Encounter for surgical aftercare following surgery on the digestive system: Secondary | ICD-10-CM | POA: Diagnosis not present

## 2017-05-13 DIAGNOSIS — D510 Vitamin B12 deficiency anemia due to intrinsic factor deficiency: Secondary | ICD-10-CM | POA: Diagnosis not present

## 2017-05-13 DIAGNOSIS — R69 Illness, unspecified: Secondary | ICD-10-CM | POA: Diagnosis not present

## 2017-06-07 DIAGNOSIS — E785 Hyperlipidemia, unspecified: Secondary | ICD-10-CM | POA: Diagnosis not present

## 2017-06-07 DIAGNOSIS — M542 Cervicalgia: Secondary | ICD-10-CM | POA: Diagnosis not present

## 2017-06-07 DIAGNOSIS — Z6833 Body mass index (BMI) 33.0-33.9, adult: Secondary | ICD-10-CM | POA: Diagnosis not present

## 2017-06-07 DIAGNOSIS — K08109 Complete loss of teeth, unspecified cause, unspecified class: Secondary | ICD-10-CM | POA: Diagnosis not present

## 2017-06-07 DIAGNOSIS — R69 Illness, unspecified: Secondary | ICD-10-CM | POA: Diagnosis not present

## 2017-06-07 DIAGNOSIS — Z0000 Encounter for general adult medical examination without abnormal findings: Secondary | ICD-10-CM | POA: Diagnosis not present

## 2017-06-07 DIAGNOSIS — E669 Obesity, unspecified: Secondary | ICD-10-CM | POA: Diagnosis not present

## 2017-06-07 DIAGNOSIS — I10 Essential (primary) hypertension: Secondary | ICD-10-CM | POA: Diagnosis not present

## 2017-06-07 DIAGNOSIS — M130 Polyarthritis, unspecified: Secondary | ICD-10-CM | POA: Diagnosis not present

## 2017-06-07 DIAGNOSIS — K219 Gastro-esophageal reflux disease without esophagitis: Secondary | ICD-10-CM | POA: Diagnosis not present

## 2017-07-13 ENCOUNTER — Other Ambulatory Visit (HOSPITAL_COMMUNITY): Payer: Self-pay | Admitting: Adult Health

## 2017-07-15 NOTE — Telephone Encounter (Signed)
Pt's pharmacy is requesting a refill on spironolactone 25 mg tablet. This pt was going to CHF clinic where this medication was started, now Dr. Meda Coffee sees this pt. Would you like to refill this medication? Please advise

## 2017-07-19 ENCOUNTER — Other Ambulatory Visit: Payer: Self-pay | Admitting: Gastroenterology

## 2017-07-20 DIAGNOSIS — M5033 Other cervical disc degeneration, cervicothoracic region: Secondary | ICD-10-CM | POA: Diagnosis not present

## 2017-07-31 DIAGNOSIS — G894 Chronic pain syndrome: Secondary | ICD-10-CM | POA: Diagnosis not present

## 2017-07-31 DIAGNOSIS — M542 Cervicalgia: Secondary | ICD-10-CM | POA: Diagnosis not present

## 2017-07-31 DIAGNOSIS — Z79891 Long term (current) use of opiate analgesic: Secondary | ICD-10-CM | POA: Diagnosis not present

## 2017-08-24 DIAGNOSIS — M1711 Unilateral primary osteoarthritis, right knee: Secondary | ICD-10-CM | POA: Diagnosis not present

## 2017-08-24 DIAGNOSIS — M2351 Chronic instability of knee, right knee: Secondary | ICD-10-CM | POA: Diagnosis not present

## 2017-08-31 DIAGNOSIS — Z79891 Long term (current) use of opiate analgesic: Secondary | ICD-10-CM | POA: Diagnosis not present

## 2017-08-31 DIAGNOSIS — M5030 Other cervical disc degeneration, unspecified cervical region: Secondary | ICD-10-CM | POA: Diagnosis not present

## 2017-09-07 DIAGNOSIS — M19072 Primary osteoarthritis, left ankle and foot: Secondary | ICD-10-CM | POA: Diagnosis not present

## 2017-09-07 DIAGNOSIS — M19071 Primary osteoarthritis, right ankle and foot: Secondary | ICD-10-CM | POA: Diagnosis not present

## 2017-09-17 ENCOUNTER — Other Ambulatory Visit: Payer: Self-pay | Admitting: Hematology and Oncology

## 2017-09-17 DIAGNOSIS — C50412 Malignant neoplasm of upper-outer quadrant of left female breast: Secondary | ICD-10-CM

## 2017-11-01 DIAGNOSIS — R69 Illness, unspecified: Secondary | ICD-10-CM | POA: Diagnosis not present

## 2017-11-01 DIAGNOSIS — D649 Anemia, unspecified: Secondary | ICD-10-CM | POA: Diagnosis not present

## 2017-11-01 DIAGNOSIS — R5383 Other fatigue: Secondary | ICD-10-CM | POA: Diagnosis not present

## 2017-11-01 DIAGNOSIS — E559 Vitamin D deficiency, unspecified: Secondary | ICD-10-CM | POA: Diagnosis not present

## 2017-11-01 DIAGNOSIS — E669 Obesity, unspecified: Secondary | ICD-10-CM | POA: Diagnosis not present

## 2017-11-01 DIAGNOSIS — Z6830 Body mass index (BMI) 30.0-30.9, adult: Secondary | ICD-10-CM | POA: Diagnosis not present

## 2017-11-01 DIAGNOSIS — I10 Essential (primary) hypertension: Secondary | ICD-10-CM | POA: Diagnosis not present

## 2017-11-01 DIAGNOSIS — E7800 Pure hypercholesterolemia, unspecified: Secondary | ICD-10-CM | POA: Diagnosis not present

## 2018-01-10 DIAGNOSIS — M5030 Other cervical disc degeneration, unspecified cervical region: Secondary | ICD-10-CM | POA: Diagnosis not present

## 2018-03-21 DIAGNOSIS — M47812 Spondylosis without myelopathy or radiculopathy, cervical region: Secondary | ICD-10-CM | POA: Diagnosis not present

## 2018-03-21 DIAGNOSIS — M542 Cervicalgia: Secondary | ICD-10-CM | POA: Diagnosis not present

## 2018-03-21 DIAGNOSIS — M5030 Other cervical disc degeneration, unspecified cervical region: Secondary | ICD-10-CM | POA: Diagnosis not present

## 2018-03-21 DIAGNOSIS — Z79891 Long term (current) use of opiate analgesic: Secondary | ICD-10-CM | POA: Diagnosis not present

## 2018-03-28 DIAGNOSIS — R32 Unspecified urinary incontinence: Secondary | ICD-10-CM | POA: Diagnosis not present

## 2018-03-28 DIAGNOSIS — G629 Polyneuropathy, unspecified: Secondary | ICD-10-CM | POA: Diagnosis not present

## 2018-03-28 DIAGNOSIS — R69 Illness, unspecified: Secondary | ICD-10-CM | POA: Diagnosis not present

## 2018-03-28 DIAGNOSIS — R269 Unspecified abnormalities of gait and mobility: Secondary | ICD-10-CM | POA: Diagnosis not present

## 2018-03-28 DIAGNOSIS — E785 Hyperlipidemia, unspecified: Secondary | ICD-10-CM | POA: Diagnosis not present

## 2018-03-28 DIAGNOSIS — K219 Gastro-esophageal reflux disease without esophagitis: Secondary | ICD-10-CM | POA: Diagnosis not present

## 2018-03-28 DIAGNOSIS — I10 Essential (primary) hypertension: Secondary | ICD-10-CM | POA: Diagnosis not present

## 2018-03-28 DIAGNOSIS — G8929 Other chronic pain: Secondary | ICD-10-CM | POA: Diagnosis not present

## 2018-03-28 DIAGNOSIS — K08109 Complete loss of teeth, unspecified cause, unspecified class: Secondary | ICD-10-CM | POA: Diagnosis not present

## 2018-03-28 DIAGNOSIS — E669 Obesity, unspecified: Secondary | ICD-10-CM | POA: Diagnosis not present

## 2018-04-07 ENCOUNTER — Encounter: Payer: Medicare HMO | Admitting: Adult Health

## 2018-04-08 ENCOUNTER — Inpatient Hospital Stay: Payer: Medicare HMO | Attending: Adult Health | Admitting: Adult Health

## 2018-04-11 ENCOUNTER — Encounter: Payer: Self-pay | Admitting: Adult Health

## 2018-04-14 DIAGNOSIS — M47812 Spondylosis without myelopathy or radiculopathy, cervical region: Secondary | ICD-10-CM | POA: Diagnosis not present

## 2018-05-04 DIAGNOSIS — I428 Other cardiomyopathies: Secondary | ICD-10-CM | POA: Diagnosis not present

## 2018-05-04 DIAGNOSIS — D513 Other dietary vitamin B12 deficiency anemia: Secondary | ICD-10-CM | POA: Diagnosis not present

## 2018-05-04 DIAGNOSIS — D500 Iron deficiency anemia secondary to blood loss (chronic): Secondary | ICD-10-CM | POA: Diagnosis not present

## 2018-05-04 DIAGNOSIS — E7800 Pure hypercholesterolemia, unspecified: Secondary | ICD-10-CM | POA: Diagnosis not present

## 2018-05-04 DIAGNOSIS — C50912 Malignant neoplasm of unspecified site of left female breast: Secondary | ICD-10-CM | POA: Diagnosis not present

## 2018-05-04 DIAGNOSIS — E669 Obesity, unspecified: Secondary | ICD-10-CM | POA: Diagnosis not present

## 2018-05-04 DIAGNOSIS — I350 Nonrheumatic aortic (valve) stenosis: Secondary | ICD-10-CM | POA: Diagnosis not present

## 2018-05-04 DIAGNOSIS — D649 Anemia, unspecified: Secondary | ICD-10-CM | POA: Diagnosis not present

## 2018-05-04 DIAGNOSIS — I10 Essential (primary) hypertension: Secondary | ICD-10-CM | POA: Diagnosis not present

## 2018-05-04 DIAGNOSIS — Z23 Encounter for immunization: Secondary | ICD-10-CM | POA: Diagnosis not present

## 2018-05-04 DIAGNOSIS — R69 Illness, unspecified: Secondary | ICD-10-CM | POA: Diagnosis not present

## 2018-05-05 DIAGNOSIS — Z79891 Long term (current) use of opiate analgesic: Secondary | ICD-10-CM | POA: Diagnosis not present

## 2018-05-05 DIAGNOSIS — G894 Chronic pain syndrome: Secondary | ICD-10-CM | POA: Diagnosis not present

## 2018-05-05 DIAGNOSIS — M5030 Other cervical disc degeneration, unspecified cervical region: Secondary | ICD-10-CM | POA: Diagnosis not present

## 2018-05-14 ENCOUNTER — Other Ambulatory Visit (HOSPITAL_COMMUNITY): Payer: Self-pay | Admitting: Cardiology

## 2018-05-20 ENCOUNTER — Other Ambulatory Visit: Payer: Self-pay | Admitting: Gastroenterology

## 2018-05-31 ENCOUNTER — Other Ambulatory Visit (HOSPITAL_COMMUNITY): Payer: Self-pay | Admitting: Cardiology

## 2018-06-01 NOTE — Telephone Encounter (Signed)
Outpatient Medication Detail    Disp Refills Start End   spironolactone (ALDACTONE) 25 MG tablet 30 tablet 0 05/17/2018    Sig - Route: Take 0.5 tablets (12.5 mg total) by mouth daily. Please schedule overdue appt with Dr Meda Coffee for future refills. Thank You! (1st attempt) - Oral   Sent to pharmacy as: spironolactone (ALDACTONE) 25 MG tablet   E-Prescribing Status: Receipt confirmed by pharmacy (05/17/2018 9:21 AM EST)   Pharmacy   [id number] - [place], [place] - [place]

## 2018-06-03 ENCOUNTER — Other Ambulatory Visit (HOSPITAL_COMMUNITY): Payer: Self-pay | Admitting: Cardiology

## 2018-06-05 ENCOUNTER — Other Ambulatory Visit (HOSPITAL_COMMUNITY): Payer: Self-pay | Admitting: Cardiology

## 2018-06-13 ENCOUNTER — Other Ambulatory Visit: Payer: Self-pay | Admitting: Cardiology

## 2018-06-13 MED ORDER — SPIRONOLACTONE 25 MG PO TABS: 12.5000 mg | ORAL_TABLET | Freq: Every day | ORAL | 0 refills | Status: DC

## 2018-07-04 ENCOUNTER — Other Ambulatory Visit (HOSPITAL_COMMUNITY): Payer: Self-pay | Admitting: Cardiology

## 2018-08-10 DIAGNOSIS — S76312A Strain of muscle, fascia and tendon of the posterior muscle group at thigh level, left thigh, initial encounter: Secondary | ICD-10-CM | POA: Diagnosis not present

## 2018-08-10 DIAGNOSIS — W19XXXA Unspecified fall, initial encounter: Secondary | ICD-10-CM | POA: Diagnosis not present

## 2018-08-27 ENCOUNTER — Other Ambulatory Visit (HOSPITAL_COMMUNITY): Payer: Self-pay | Admitting: Cardiology

## 2018-08-29 DIAGNOSIS — M542 Cervicalgia: Secondary | ICD-10-CM | POA: Diagnosis not present

## 2018-08-29 DIAGNOSIS — Z79891 Long term (current) use of opiate analgesic: Secondary | ICD-10-CM | POA: Diagnosis not present

## 2018-08-29 DIAGNOSIS — G894 Chronic pain syndrome: Secondary | ICD-10-CM | POA: Diagnosis not present

## 2018-08-29 DIAGNOSIS — M5030 Other cervical disc degeneration, unspecified cervical region: Secondary | ICD-10-CM | POA: Diagnosis not present

## 2018-09-06 ENCOUNTER — Other Ambulatory Visit: Payer: Self-pay | Admitting: Hematology and Oncology

## 2018-09-06 DIAGNOSIS — C50412 Malignant neoplasm of upper-outer quadrant of left female breast: Secondary | ICD-10-CM

## 2018-09-30 ENCOUNTER — Other Ambulatory Visit: Payer: Self-pay | Admitting: Hematology and Oncology

## 2018-09-30 DIAGNOSIS — C50412 Malignant neoplasm of upper-outer quadrant of left female breast: Secondary | ICD-10-CM

## 2018-10-04 ENCOUNTER — Other Ambulatory Visit: Payer: Self-pay | Admitting: Hematology and Oncology

## 2018-10-04 DIAGNOSIS — C50412 Malignant neoplasm of upper-outer quadrant of left female breast: Secondary | ICD-10-CM

## 2018-10-09 ENCOUNTER — Other Ambulatory Visit: Payer: Self-pay | Admitting: Hematology and Oncology

## 2018-10-09 DIAGNOSIS — C50412 Malignant neoplasm of upper-outer quadrant of left female breast: Secondary | ICD-10-CM

## 2018-10-22 ENCOUNTER — Other Ambulatory Visit: Payer: Self-pay | Admitting: Hematology and Oncology

## 2018-10-22 DIAGNOSIS — C50412 Malignant neoplasm of upper-outer quadrant of left female breast: Secondary | ICD-10-CM

## 2018-10-24 ENCOUNTER — Inpatient Hospital Stay (HOSPITAL_COMMUNITY): Payer: Medicare HMO | Admitting: Anesthesiology

## 2018-10-24 ENCOUNTER — Emergency Department (HOSPITAL_COMMUNITY): Payer: Medicare HMO

## 2018-10-24 ENCOUNTER — Inpatient Hospital Stay (HOSPITAL_COMMUNITY)
Admission: EM | Admit: 2018-10-24 | Discharge: 2018-10-26 | DRG: 481 | Disposition: A | Discharge status: Home Health | Payer: Medicare HMO | Attending: Internal Medicine | Admitting: Internal Medicine

## 2018-10-24 ENCOUNTER — Encounter (HOSPITAL_COMMUNITY)
Admission: EM | Disposition: A | Discharge status: Home Health | Payer: Self-pay | Source: Home / Self Care | Attending: Internal Medicine

## 2018-10-24 ENCOUNTER — Encounter (HOSPITAL_COMMUNITY): Payer: Self-pay

## 2018-10-24 ENCOUNTER — Other Ambulatory Visit: Payer: Self-pay

## 2018-10-24 ENCOUNTER — Inpatient Hospital Stay (HOSPITAL_COMMUNITY): Payer: Medicare HMO

## 2018-10-24 DIAGNOSIS — K219 Gastro-esophageal reflux disease without esophagitis: Secondary | ICD-10-CM | POA: Diagnosis present

## 2018-10-24 DIAGNOSIS — I10 Essential (primary) hypertension: Secondary | ICD-10-CM | POA: Diagnosis not present

## 2018-10-24 DIAGNOSIS — Z9012 Acquired absence of left breast and nipple: Secondary | ICD-10-CM

## 2018-10-24 DIAGNOSIS — Y92009 Unspecified place in unspecified non-institutional (private) residence as the place of occurrence of the external cause: Secondary | ICD-10-CM

## 2018-10-24 DIAGNOSIS — M25551 Pain in right hip: Secondary | ICD-10-CM | POA: Diagnosis present

## 2018-10-24 DIAGNOSIS — M47817 Spondylosis without myelopathy or radiculopathy, lumbosacral region: Secondary | ICD-10-CM | POA: Diagnosis present

## 2018-10-24 DIAGNOSIS — Z923 Personal history of irradiation: Secondary | ICD-10-CM

## 2018-10-24 DIAGNOSIS — F329 Major depressive disorder, single episode, unspecified: Secondary | ICD-10-CM | POA: Diagnosis present

## 2018-10-24 DIAGNOSIS — M8580 Other specified disorders of bone density and structure, unspecified site: Secondary | ICD-10-CM | POA: Diagnosis present

## 2018-10-24 DIAGNOSIS — I5032 Chronic diastolic (congestive) heart failure: Secondary | ICD-10-CM | POA: Diagnosis not present

## 2018-10-24 DIAGNOSIS — M47816 Spondylosis without myelopathy or radiculopathy, lumbar region: Secondary | ICD-10-CM | POA: Diagnosis present

## 2018-10-24 DIAGNOSIS — R69 Illness, unspecified: Secondary | ICD-10-CM | POA: Diagnosis not present

## 2018-10-24 DIAGNOSIS — M25562 Pain in left knee: Secondary | ICD-10-CM | POA: Diagnosis not present

## 2018-10-24 DIAGNOSIS — R918 Other nonspecific abnormal finding of lung field: Secondary | ICD-10-CM | POA: Diagnosis not present

## 2018-10-24 DIAGNOSIS — S72141A Displaced intertrochanteric fracture of right femur, initial encounter for closed fracture: Secondary | ICD-10-CM | POA: Diagnosis not present

## 2018-10-24 DIAGNOSIS — W010XXA Fall on same level from slipping, tripping and stumbling without subsequent striking against object, initial encounter: Secondary | ICD-10-CM | POA: Diagnosis present

## 2018-10-24 DIAGNOSIS — E559 Vitamin D deficiency, unspecified: Secondary | ICD-10-CM | POA: Diagnosis present

## 2018-10-24 DIAGNOSIS — E785 Hyperlipidemia, unspecified: Secondary | ICD-10-CM | POA: Diagnosis present

## 2018-10-24 DIAGNOSIS — Z79811 Long term (current) use of aromatase inhibitors: Secondary | ICD-10-CM

## 2018-10-24 DIAGNOSIS — D500 Iron deficiency anemia secondary to blood loss (chronic): Secondary | ICD-10-CM | POA: Diagnosis present

## 2018-10-24 DIAGNOSIS — I110 Hypertensive heart disease with heart failure: Secondary | ICD-10-CM | POA: Diagnosis not present

## 2018-10-24 DIAGNOSIS — S0990XA Unspecified injury of head, initial encounter: Secondary | ICD-10-CM | POA: Diagnosis not present

## 2018-10-24 DIAGNOSIS — Z96612 Presence of left artificial shoulder joint: Secondary | ICD-10-CM | POA: Diagnosis present

## 2018-10-24 DIAGNOSIS — I350 Nonrheumatic aortic (valve) stenosis: Secondary | ICD-10-CM | POA: Diagnosis present

## 2018-10-24 DIAGNOSIS — M19011 Primary osteoarthritis, right shoulder: Secondary | ICD-10-CM | POA: Diagnosis present

## 2018-10-24 DIAGNOSIS — G4700 Insomnia, unspecified: Secondary | ICD-10-CM | POA: Diagnosis present

## 2018-10-24 DIAGNOSIS — Y9301 Activity, walking, marching and hiking: Secondary | ICD-10-CM | POA: Diagnosis present

## 2018-10-24 DIAGNOSIS — A419 Sepsis, unspecified organism: Secondary | ICD-10-CM | POA: Diagnosis not present

## 2018-10-24 DIAGNOSIS — Z9181 History of falling: Secondary | ICD-10-CM | POA: Diagnosis not present

## 2018-10-24 DIAGNOSIS — S72009A Fracture of unspecified part of neck of unspecified femur, initial encounter for closed fracture: Secondary | ICD-10-CM

## 2018-10-24 DIAGNOSIS — R52 Pain, unspecified: Secondary | ICD-10-CM | POA: Diagnosis not present

## 2018-10-24 DIAGNOSIS — R0902 Hypoxemia: Secondary | ICD-10-CM | POA: Diagnosis not present

## 2018-10-24 DIAGNOSIS — S72141D Displaced intertrochanteric fracture of right femur, subsequent encounter for closed fracture with routine healing: Secondary | ICD-10-CM | POA: Diagnosis not present

## 2018-10-24 DIAGNOSIS — Z79899 Other long term (current) drug therapy: Secondary | ICD-10-CM

## 2018-10-24 DIAGNOSIS — S72001A Fracture of unspecified part of neck of right femur, initial encounter for closed fracture: Secondary | ICD-10-CM | POA: Diagnosis not present

## 2018-10-24 DIAGNOSIS — M47812 Spondylosis without myelopathy or radiculopathy, cervical region: Secondary | ICD-10-CM | POA: Diagnosis present

## 2018-10-24 DIAGNOSIS — I959 Hypotension, unspecified: Secondary | ICD-10-CM | POA: Diagnosis not present

## 2018-10-24 DIAGNOSIS — I5033 Acute on chronic diastolic (congestive) heart failure: Secondary | ICD-10-CM | POA: Diagnosis not present

## 2018-10-24 DIAGNOSIS — Z8781 Personal history of (healed) traumatic fracture: Secondary | ICD-10-CM

## 2018-10-24 DIAGNOSIS — C50412 Malignant neoplasm of upper-outer quadrant of left female breast: Secondary | ICD-10-CM | POA: Diagnosis not present

## 2018-10-24 DIAGNOSIS — R296 Repeated falls: Secondary | ICD-10-CM | POA: Diagnosis not present

## 2018-10-24 DIAGNOSIS — J45909 Unspecified asthma, uncomplicated: Secondary | ICD-10-CM | POA: Diagnosis not present

## 2018-10-24 DIAGNOSIS — W19XXXA Unspecified fall, initial encounter: Secondary | ICD-10-CM | POA: Diagnosis not present

## 2018-10-24 HISTORY — PX: INTRAMEDULLARY (IM) NAIL INTERTROCHANTERIC: SHX5875

## 2018-10-24 LAB — CREATININE, SERUM
Creatinine, Ser: 1.16 mg/dL — ABNORMAL HIGH (ref 0.44–1.00)
GFR calc Af Amer: 50 mL/min — ABNORMAL LOW (ref >60)
GFR calc non Af Amer: 44 mL/min — ABNORMAL LOW (ref >60)

## 2018-10-24 LAB — CBC WITH DIFFERENTIAL/PLATELET
Abs Immature Granulocytes: 0.08 10*3/uL — ABNORMAL HIGH (ref 0.00–0.07)
Basophils Absolute: 0 10*3/uL (ref 0.0–0.1)
Basophils Relative: 0 %
Eosinophils Absolute: 0.1 10*3/uL (ref 0.0–0.5)
Eosinophils Relative: 1 %
HCT: 31.4 % — ABNORMAL LOW (ref 36.0–46.0)
Hemoglobin: 9.7 g/dL — ABNORMAL LOW (ref 12.0–15.0)
Immature Granulocytes: 1 %
Lymphocytes Relative: 19 %
Lymphs Abs: 2.1 10*3/uL (ref 0.7–4.0)
MCH: 31 pg (ref 26.0–34.0)
MCHC: 30.9 g/dL (ref 30.0–36.0)
MCV: 100.3 fL — ABNORMAL HIGH (ref 80.0–100.0)
Monocytes Absolute: 0.8 10*3/uL (ref 0.1–1.0)
Monocytes Relative: 7 %
Neutro Abs: 8.1 10*3/uL — ABNORMAL HIGH (ref 1.7–7.7)
Neutrophils Relative %: 72 %
Platelets: 235 10*3/uL (ref 150–400)
RBC: 3.13 MIL/uL — ABNORMAL LOW (ref 3.87–5.11)
RDW: 14.4 % (ref 11.5–15.5)
WBC: 11.2 10*3/uL — ABNORMAL HIGH (ref 4.0–10.5)
nRBC: 0 % (ref 0.0–0.2)

## 2018-10-24 LAB — BASIC METABOLIC PANEL
Anion gap: 9 (ref 5–15)
BUN: 32 mg/dL — ABNORMAL HIGH (ref 8–23)
CO2: 23 mmol/L (ref 22–32)
Calcium: 8.8 mg/dL — ABNORMAL LOW (ref 8.9–10.3)
Chloride: 108 mmol/L (ref 98–111)
Creatinine, Ser: 1.23 mg/dL — ABNORMAL HIGH (ref 0.44–1.00)
GFR calc Af Amer: 47 mL/min — ABNORMAL LOW (ref >60)
GFR calc non Af Amer: 41 mL/min — ABNORMAL LOW (ref >60)
Glucose, Bld: 135 mg/dL — ABNORMAL HIGH (ref 70–99)
Potassium: 3.8 mmol/L (ref 3.5–5.1)
Sodium: 140 mmol/L (ref 135–145)

## 2018-10-24 LAB — CBC
HCT: 32.6 % — ABNORMAL LOW (ref 36.0–46.0)
Hemoglobin: 10.1 g/dL — ABNORMAL LOW (ref 12.0–15.0)
MCH: 31.1 pg (ref 26.0–34.0)
MCHC: 31 g/dL (ref 30.0–36.0)
MCV: 100.3 fL — ABNORMAL HIGH (ref 80.0–100.0)
Platelets: 232 10*3/uL (ref 150–400)
RBC: 3.25 MIL/uL — ABNORMAL LOW (ref 3.87–5.11)
RDW: 14.5 % (ref 11.5–15.5)
WBC: 12.3 10*3/uL — ABNORMAL HIGH (ref 4.0–10.5)
nRBC: 0 % (ref 0.0–0.2)

## 2018-10-24 LAB — SURGICAL PCR SCREEN
MRSA, PCR: NEGATIVE
Staphylococcus aureus: NEGATIVE

## 2018-10-24 LAB — TROPONIN I: Troponin I: <0.03 ng/mL (ref <0.03)

## 2018-10-24 LAB — PHOSPHORUS: Phosphorus: 4.1 mg/dL (ref 2.5–4.6)

## 2018-10-24 LAB — MAGNESIUM: Magnesium: 2 mg/dL (ref 1.7–2.4)

## 2018-10-24 SURGERY — FIXATION, FRACTURE, INTERTROCHANTERIC, WITH INTRAMEDULLARY ROD
Anesthesia: General | Site: Hip | Laterality: Right

## 2018-10-24 MED ORDER — PROPOFOL 10 MG/ML IV BOLUS
INTRAVENOUS | Status: DC | PRN
  Administered 2018-10-24: 20 mg via INTRAVENOUS

## 2018-10-24 MED ORDER — ACETAMINOPHEN 10 MG/ML IV SOLN: 1000.0000 mg | Freq: Once | INTRAVENOUS | Status: DC

## 2018-10-24 MED ORDER — METOCLOPRAMIDE HCL 5 MG PO TABS: 5.0000 mg | ORAL_TABLET | Freq: Three times a day (TID) | ORAL | Status: DC | PRN

## 2018-10-24 MED ORDER — CEFAZOLIN SODIUM-DEXTROSE 1-4 GM/50ML-% IV SOLN
1.0000 g | Freq: Four times a day (QID) | INTRAVENOUS | Status: AC
  Administered 2018-10-25 (×2): 1 g via INTRAVENOUS
  Filled 2018-10-24 (×2): qty 50

## 2018-10-24 MED ORDER — 0.9 % SODIUM CHLORIDE (POUR BTL) OPTIME
TOPICAL | Status: DC | PRN
  Administered 2018-10-24: 1000 mL

## 2018-10-24 MED ORDER — MUPIROCIN 2 % EX OINT: 1.0000 "application " | TOPICAL_OINTMENT | Freq: Two times a day (BID) | CUTANEOUS | Status: DC

## 2018-10-24 MED ORDER — ENOXAPARIN SODIUM 40 MG/0.4ML ~~LOC~~ SOLN
40.0000 mg | Freq: Every day | SUBCUTANEOUS | Status: DC
  Administered 2018-10-25 – 2018-10-26 (×2): 40 mg via SUBCUTANEOUS
  Filled 2018-10-24 (×2): qty 0.4

## 2018-10-24 MED ORDER — FENTANYL CITRATE (PF) 250 MCG/5ML IJ SOLN
INTRAMUSCULAR | Status: AC
  Filled 2018-10-24: qty 5

## 2018-10-24 MED ORDER — CEFAZOLIN SODIUM-DEXTROSE 2-4 GM/100ML-% IV SOLN
INTRAVENOUS | Status: AC
  Filled 2018-10-24: qty 100

## 2018-10-24 MED ORDER — SODIUM CHLORIDE 0.9 % IV SOLN
Freq: Once | INTRAVENOUS | Status: AC
  Administered 2018-10-24: 16:00:00 via INTRAVENOUS

## 2018-10-24 MED ORDER — ACETAMINOPHEN 500 MG PO TABS
1000.0000 mg | ORAL_TABLET | Freq: Once | ORAL | Status: AC
  Administered 2018-10-24: 16:00:00 1000 mg via ORAL
  Filled 2018-10-24: qty 2

## 2018-10-24 MED ORDER — MORPHINE SULFATE (PF) 2 MG/ML IV SOLN
2.0000 mg | Freq: Once | INTRAVENOUS | Status: AC
  Administered 2018-10-24: 16:00:00 2 mg via INTRAVENOUS
  Filled 2018-10-24: qty 1

## 2018-10-24 MED ORDER — PANTOPRAZOLE SODIUM 40 MG PO TBEC
80.0000 mg | DELAYED_RELEASE_TABLET | Freq: Every day | ORAL | Status: DC
  Administered 2018-10-25 – 2018-10-26 (×2): 80 mg via ORAL
  Filled 2018-10-24 (×2): qty 2

## 2018-10-24 MED ORDER — LETROZOLE 2.5 MG PO TABS
2.5000 mg | ORAL_TABLET | Freq: Every day | ORAL | Status: DC
  Filled 2018-10-24: qty 1

## 2018-10-24 MED ORDER — LACTATED RINGERS IV SOLN
INTRAVENOUS | Status: DC | PRN
  Administered 2018-10-24: 21:00:00 via INTRAVENOUS

## 2018-10-24 MED ORDER — DOCUSATE SODIUM 100 MG PO CAPS
100.0000 mg | ORAL_CAPSULE | Freq: Two times a day (BID) | ORAL | Status: DC
  Administered 2018-10-25 – 2018-10-26 (×3): 100 mg via ORAL
  Filled 2018-10-24 (×3): qty 1

## 2018-10-24 MED ORDER — FENTANYL CITRATE (PF) 100 MCG/2ML IJ SOLN
INTRAMUSCULAR | Status: DC | PRN
  Administered 2018-10-24 (×2): 50 ug via INTRAVENOUS
  Administered 2018-10-24: 150 ug via INTRAVENOUS

## 2018-10-24 MED ORDER — ROCURONIUM BROMIDE 10 MG/ML (PF) SYRINGE
PREFILLED_SYRINGE | INTRAVENOUS | Status: DC | PRN
  Administered 2018-10-24: 40 mg via INTRAVENOUS

## 2018-10-24 MED ORDER — AMLODIPINE BESYLATE 5 MG PO TABS
5.0000 mg | ORAL_TABLET | Freq: Every day | ORAL | Status: DC
  Filled 2018-10-24: qty 1

## 2018-10-24 MED ORDER — FENTANYL CITRATE (PF) 100 MCG/2ML IJ SOLN: 25.0000 ug | INTRAMUSCULAR | Status: DC | PRN

## 2018-10-24 MED ORDER — DIPHENHYDRAMINE HCL 50 MG/ML IJ SOLN
INTRAMUSCULAR | Status: AC
  Filled 2018-10-24: qty 1

## 2018-10-24 MED ORDER — MORPHINE SULFATE (PF) 2 MG/ML IV SOLN
1.0000 mg | INTRAVENOUS | Status: DC | PRN
  Administered 2018-10-25: 1 mg via INTRAVENOUS
  Filled 2018-10-24: qty 1

## 2018-10-24 MED ORDER — SUCCINYLCHOLINE CHLORIDE 20 MG/ML IJ SOLN
INTRAMUSCULAR | Status: DC | PRN
  Administered 2018-10-24: 100 mg via INTRAVENOUS

## 2018-10-24 MED ORDER — ONDANSETRON HCL 4 MG/2ML IJ SOLN
INTRAMUSCULAR | Status: DC | PRN
  Administered 2018-10-24: 4 mg via INTRAVENOUS

## 2018-10-24 MED ORDER — METHOCARBAMOL 1000 MG/10ML IJ SOLN
500.0000 mg | Freq: Four times a day (QID) | INTRAVENOUS | Status: DC | PRN
  Filled 2018-10-24: qty 5

## 2018-10-24 MED ORDER — MENTHOL 3 MG MT LOZG: 1.0000 | LOZENGE | OROMUCOSAL | Status: DC | PRN

## 2018-10-24 MED ORDER — ALBUMIN HUMAN 5 % IV SOLN
INTRAVENOUS | Status: AC
  Filled 2018-10-24: qty 250

## 2018-10-24 MED ORDER — METHOCARBAMOL 500 MG PO TABS
500.0000 mg | ORAL_TABLET | Freq: Four times a day (QID) | ORAL | Status: DC | PRN
  Administered 2018-10-25 (×2): 500 mg via ORAL
  Filled 2018-10-24 (×2): qty 1

## 2018-10-24 MED ORDER — HYDROCODONE-ACETAMINOPHEN 5-325 MG PO TABS
1.0000 | ORAL_TABLET | ORAL | Status: DC | PRN
  Administered 2018-10-25 – 2018-10-26 (×5): 1 via ORAL
  Filled 2018-10-24 (×5): qty 1

## 2018-10-24 MED ORDER — FERROUS SULFATE 325 (65 FE) MG PO TABS
325.0000 mg | ORAL_TABLET | Freq: Every day | ORAL | Status: DC
  Administered 2018-10-25 – 2018-10-26 (×2): 325 mg via ORAL
  Filled 2018-10-24 (×2): qty 1

## 2018-10-24 MED ORDER — PHENYLEPHRINE 40 MCG/ML (10ML) SYRINGE FOR IV PUSH (FOR BLOOD PRESSURE SUPPORT)
PREFILLED_SYRINGE | INTRAVENOUS | Status: AC
  Filled 2018-10-24: qty 20

## 2018-10-24 MED ORDER — PHENOL 1.4 % MT LIQD
1.0000 | OROMUCOSAL | Status: DC | PRN
  Filled 2018-10-24: qty 177

## 2018-10-24 MED ORDER — CITALOPRAM HYDROBROMIDE 20 MG PO TABS
40.0000 mg | ORAL_TABLET | Freq: Every day | ORAL | Status: DC
  Administered 2018-10-25 – 2018-10-26 (×2): 40 mg via ORAL
  Filled 2018-10-24 (×2): qty 2

## 2018-10-24 MED ORDER — SPIRONOLACTONE 12.5 MG HALF TABLET
12.5000 mg | ORAL_TABLET | Freq: Every day | ORAL | Status: DC
  Administered 2018-10-25 – 2018-10-26 (×2): 12.5 mg via ORAL
  Filled 2018-10-24 (×2): qty 1

## 2018-10-24 MED ORDER — METOCLOPRAMIDE HCL 5 MG/ML IJ SOLN
5.0000 mg | Freq: Three times a day (TID) | INTRAMUSCULAR | Status: DC | PRN
  Administered 2018-10-25: 10 mg via INTRAVENOUS
  Filled 2018-10-24: qty 2

## 2018-10-24 MED ORDER — PHENYLEPHRINE 40 MCG/ML (10ML) SYRINGE FOR IV PUSH (FOR BLOOD PRESSURE SUPPORT)
PREFILLED_SYRINGE | INTRAVENOUS | Status: DC | PRN
  Administered 2018-10-24 (×3): 120 ug via INTRAVENOUS
  Administered 2018-10-24: 80 ug via INTRAVENOUS
  Administered 2018-10-24: 120 ug via INTRAVENOUS

## 2018-10-24 MED ORDER — PROPOFOL 10 MG/ML IV BOLUS
INTRAVENOUS | Status: AC
  Filled 2018-10-24: qty 20

## 2018-10-24 MED ORDER — CEFAZOLIN SODIUM-DEXTROSE 2-4 GM/100ML-% IV SOLN: 2.0000 g | Freq: Once | INTRAVENOUS | Status: DC

## 2018-10-24 MED ORDER — ALBUMIN HUMAN 5 % IV SOLN
INTRAVENOUS | Status: DC | PRN
  Administered 2018-10-24: 21:00:00 via INTRAVENOUS

## 2018-10-24 MED ORDER — LIDOCAINE 2% (20 MG/ML) 5 ML SYRINGE
INTRAMUSCULAR | Status: DC | PRN
  Administered 2018-10-24: 60 mg via INTRAVENOUS

## 2018-10-24 MED ORDER — ATORVASTATIN CALCIUM 40 MG PO TABS
40.0000 mg | ORAL_TABLET | Freq: Every day | ORAL | Status: DC
  Administered 2018-10-25 – 2018-10-26 (×2): 40 mg via ORAL
  Filled 2018-10-24 (×2): qty 1

## 2018-10-24 MED ORDER — ONDANSETRON HCL 4 MG PO TABS
4.0000 mg | ORAL_TABLET | Freq: Every day | ORAL | Status: DC
  Administered 2018-10-25 – 2018-10-26 (×2): 4 mg via ORAL
  Filled 2018-10-24 (×2): qty 1

## 2018-10-24 MED ORDER — ONDANSETRON HCL 4 MG PO TABS: 4.0000 mg | ORAL_TABLET | Freq: Four times a day (QID) | ORAL | Status: DC | PRN

## 2018-10-24 MED ORDER — DIPHENHYDRAMINE HCL 50 MG/ML IJ SOLN
INTRAMUSCULAR | Status: DC | PRN
  Administered 2018-10-24: 12.5 mg via INTRAVENOUS

## 2018-10-24 MED ORDER — PROMETHAZINE HCL 25 MG/ML IJ SOLN: 6.2500 mg | INTRAMUSCULAR | Status: DC | PRN

## 2018-10-24 MED ORDER — HYDROCHLOROTHIAZIDE 25 MG PO TABS
25.0000 mg | ORAL_TABLET | Freq: Every day | ORAL | Status: DC
  Administered 2018-10-25 – 2018-10-26 (×2): 25 mg via ORAL
  Filled 2018-10-24 (×2): qty 1

## 2018-10-24 MED ORDER — GABAPENTIN 300 MG PO CAPS
300.0000 mg | ORAL_CAPSULE | Freq: Every day | ORAL | Status: DC
  Administered 2018-10-25 (×2): 300 mg via ORAL
  Filled 2018-10-24 (×3): qty 1

## 2018-10-24 MED ORDER — OXYCODONE-ACETAMINOPHEN 5-325 MG PO TABS: 1.0000 | ORAL_TABLET | ORAL | Status: DC | PRN

## 2018-10-24 MED ORDER — SODIUM CHLORIDE 0.9 % IV SOLN
INTRAVENOUS | Status: DC
  Administered 2018-10-25 (×2): via INTRAVENOUS

## 2018-10-24 MED ORDER — ONDANSETRON HCL 4 MG/2ML IJ SOLN: 4.0000 mg | Freq: Four times a day (QID) | INTRAMUSCULAR | Status: DC | PRN

## 2018-10-24 MED ORDER — DEXAMETHASONE SODIUM PHOSPHATE 10 MG/ML IJ SOLN
INTRAMUSCULAR | Status: DC | PRN
  Administered 2018-10-24: 5 mg via INTRAVENOUS

## 2018-10-24 MED ORDER — SUGAMMADEX SODIUM 200 MG/2ML IV SOLN
INTRAVENOUS | Status: DC | PRN
  Administered 2018-10-24: 150 mg via INTRAVENOUS

## 2018-10-24 SURGICAL SUPPLY — 27 items
BIT DRILL CANN LG 4.3MM (BIT) ×1 IMPLANT
BNDG GAUZE ELAST 4 BULKY (GAUZE/BANDAGES/DRESSINGS) ×2 IMPLANT
CHLORAPREP W/TINT 26 (MISCELLANEOUS) ×2 IMPLANT
COVER PERINEAL POST (MISCELLANEOUS) ×2 IMPLANT
COVER SURGICAL LIGHT HANDLE (MISCELLANEOUS) ×2 IMPLANT
DRAPE INCISE IOBAN 66X45 STRL (DRAPES) ×2 IMPLANT
DRILL BIT CANN LG 4.3MM (BIT) ×2
DRSG MEPILEX BORDER 4X4 (GAUZE/BANDAGES/DRESSINGS) ×2 IMPLANT
DRSG MEPILEX BORDER 4X8 (GAUZE/BANDAGES/DRESSINGS) ×2 IMPLANT
ELECT REM PT RETURN 15FT ADLT (MISCELLANEOUS) ×2 IMPLANT
FACESHIELD WRAPAROUND (MASK) ×6 IMPLANT
GLOVE BIO SURGEON STRL SZ7 (GLOVE) ×2 IMPLANT
GLOVE BIO SURGEON STRL SZ8 (GLOVE) ×2 IMPLANT
GOWN STRL REUS W/TWL LRG LVL3 (GOWN DISPOSABLE) ×6 IMPLANT
GUIDEPIN 3.2X17.5 THRD DISP (PIN) ×2 IMPLANT
KIT BASIN OR (CUSTOM PROCEDURE TRAY) ×2 IMPLANT
NAIL HIP FRACT 130D 11X180 (Screw) ×2 IMPLANT
PACK GENERAL/GYN (CUSTOM PROCEDURE TRAY) ×2 IMPLANT
PROTECTOR NERVE ULNAR (MISCELLANEOUS) ×6 IMPLANT
SCREW BONE CORTICAL 5.0X38 (Screw) ×2 IMPLANT
SCREW LAG HIP NAIL 10.5X95 (Screw) ×2 IMPLANT
SUT VIC AB 1 CT1 27 (SUTURE) ×1
SUT VIC AB 1 CT1 27XBRD ANTBC (SUTURE) ×1 IMPLANT
SUT VIC AB 2-0 CT1 27 (SUTURE) ×1
SUT VIC AB 2-0 CT1 TAPERPNT 27 (SUTURE) ×1 IMPLANT
TOWEL OR 17X26 10 PK STRL BLUE (TOWEL DISPOSABLE) ×2 IMPLANT
TRAY CATH 16FR W/PLASTIC CATH (SET/KITS/TRAYS/PACK) ×2 IMPLANT

## 2018-10-24 NOTE — Consult Note (Signed)
Reason for Consult:Right intertrochanteric femur fracture Referring Physician: Dr. Cheryll Cockayne is an 85 y.o. female.  HPI: Brandy [name] is an 85 yo female who had a mechanical fall 3 days ago and then again yesterday when she lost her balance. She was able to ambulate after both falls but today felt a pop in her right thigh with immediate pain and inability to bear weight. Her pain is isolated to her right hip and thigh. She does not have any associated paresthesias. She did not have any prodromal symptoms associated with her falls and did not hit her head or lose consciousness with either fall. She was not having any hip problems prior to the current episodes. She ambulates with a walker or cane at home due to balance issues.   Past Medical History:  Diagnosis Date  . Anemia   . Arthritis    shoulders  . Asthma    mild, no inhalers used  . Breast cancer ([place]) 12/18/11   left breast masectomy=metastatic ca in (1/1) lymph node ,invasive ductal ca,2 foci,,dcis,lymph ovascular invasion identified,surgical resection margins neg for ca,additional tissue=benign skin and subcutaneous tissue  . Bronchitis    hx of;last time >19yr ago  . Depression    takes Celexa daily  . Difficult intubation   . Dysphagia   . Full dentures   . Gall stones 2015  . GERD (gastroesophageal reflux disease)    takes Prilosec prn  . H/O hiatal hernia   . History of kidney stones    many yrs ago  . Hx of radiation therapy 04/26/12 -06/10/12   left breast  . Hyperlipidemia    takes Simvastatin daily  . Hypertension    takes Amlodipine and Diovan daily  . Insomnia    takes Ambien nightly  . Urinary urgency     Past Surgical History:  Procedure Laterality Date  . APPENDECTOMY    . bladder tack    . BREAST BIOPSY  1998   left   . DILATION AND CURETTAGE OF UTERUS    . ENDOSCOPIC RETROGRADE CHOLANGIOPANCREATOGRAPHY (ERCP) WITH PROPOFOL N/A 02/01/2014   Procedure: ENDOSCOPIC RETROGRADE  CHOLANGIOPANCREATOGRAPHY (ERCP) WITH PROPOFOL;  Surgeon: [name], MD;  Location: [facility];  Service: Endoscopy;  Laterality: N/A;  . ESOPHAGOGASTRODUODENOSCOPY    . ESOPHAGOGASTRODUODENOSCOPY N/A 06/26/2016   Procedure: ESOPHAGOGASTRODUODENOSCOPY (EGD);  Surgeon: [name], MD;  Location: [place];  Service: Endoscopy;  Laterality: N/A;  . ESOPHAGOGASTRODUODENOSCOPY (EGD) WITH PROPOFOL  12/19/2013   Procedure: ESOPHAGOGASTRODUODENOSCOPY (EGD) WITH PROPOFOL;  Surgeon: Beryle Beams, MD;  Location: [place];  Service: Endoscopy;;  . ESOPHAGOGASTRODUODENOSCOPY (EGD) WITH PROPOFOL N/A 09/10/2016   Procedure: ESOPHAGOGASTRODUODENOSCOPY (EGD) WITH PROPOFOL;  Surgeon: [name], MD;  Location: [facility];  Service: Endoscopy;  Laterality: N/A;  . EUS  06/04/2011   Procedure: UPPER ENDOSCOPIC ULTRASOUND (EUS) LINEAR;  Surgeon: Owens Loffler, MD;  Location: [facility];  Service: Endoscopy;  Laterality: N/A;  . EUS N/A 02/01/2014   Procedure: UPPER ENDOSCOPIC ULTRASOUND (EUS) LINEAR;  Surgeon: [name], MD;  Location: [facility];  Service: Endoscopy;  Laterality: N/A;  . EXPLORATORY LAPAROTOMY      biopsy of intra-abdominal mass  . LAPAROTOMY N/A 11/17/2016   Procedure: EXPLORATORY LAPAROTOMY WITH LYSIS OF ADHESIONS, SMALL BOWEL RESECTION, REPAIR OF INCARCERATED VENTRAL INCISIONAL HERNIA, INSERTION OF BIOLOGIC MESH PATCH,APPLICATION OF WOUND VAC DRESSING;  Surgeon: Armandina Gemma, MD;  Location: [facility];  Service: General;  Laterality: N/A;  . MASTECTOMY W/ SENTINEL  NODE BIOPSY  12/18/2011   Procedure: MASTECTOMY WITH SENTINEL LYMPH NODE BIOPSY;  Surgeon: Rolm Bookbinder, MD;  Location: [place];  Service: General;  Laterality: Left;  . PORT-A-CATH REMOVAL Right 06/15/2013   Procedure: REMOVAL PORT-A-CATH;  Surgeon: Rolm Bookbinder, MD;  Location: [place];  Service: General;  Laterality: Right;  . PORTACATH PLACEMENT  01/27/2012   Procedure:  INSERTION PORT-A-CATH;  Surgeon: Rolm Bookbinder, MD;  Location: [place];  Service: General;  Laterality: Right;  PORT PLACEMENT  . TOTAL SHOULDER REPLACEMENT  2011   left  . TUBAL LIGATION      Family History  Problem Relation Age of Onset  . Prostate cancer Father   . Breast cancer Other     Social History:  reports that she has never smoked. She has never used smokeless tobacco. She reports that she does not drink alcohol or use drugs.  Allergies: No Known Allergies  Medications: I have reviewed the patient's current medications.  Results for orders placed or performed during the hospital encounter of 10/24/18 (from the past 48 hour(s))  CBC with Differential     Status: Abnormal   Collection Time: 10/24/18  3:46 PM  Result Value Ref Range   WBC 11.2 (H) 4.0 - 10.5 K/uL   RBC 3.13 (L) 3.87 - 5.11 MIL/uL   Hemoglobin 9.7 (L) 12.0 - 15.0 g/dL   HCT 31.4 (L) 36.0 - 46.0 %   MCV 100.3 (H) 80.0 - 100.0 fL   MCH 31.0 26.0 - 34.0 pg   MCHC 30.9 30.0 - 36.0 g/dL   RDW 14.4 11.5 - 15.5 %   Platelets 235 150 - 400 K/uL   nRBC 0.0 0.0 - 0.2 %   Neutrophils Relative % 72 %   Neutro Abs 8.1 (H) 1.7 - 7.7 K/uL   Lymphocytes Relative 19 %   Lymphs Abs 2.1 0.7 - 4.0 K/uL   Monocytes Relative 7 %   Monocytes Absolute 0.8 0.1 - 1.0 K/uL   Eosinophils Relative 1 %   Eosinophils Absolute 0.1 0.0 - 0.5 K/uL   Basophils Relative 0 %   Basophils Absolute 0.0 0.0 - 0.1 K/uL   Immature Granulocytes 1 %   Abs Immature Granulocytes 0.08 (H) 0.00 - 0.07 K/uL    Comment: Performed at [facility], [place] [address] [place], [place] [address]  Basic metabolic panel     Status: Abnormal   Collection Time: 10/24/18  3:46 PM  Result Value Ref Range   Sodium 140 135 - 145 mmol/L   Potassium 3.8 3.5 - 5.1 mmol/L   Chloride 108 98 - 111 mmol/L   CO2 23 22 - 32 mmol/L   Glucose, Bld 135 (H) 70 - 99 mg/dL   BUN 32 (H) 8 - 23 mg/dL   Creatinine, Ser 1.23 (H)  0.44 - 1.00 mg/dL   Calcium 8.8 (L) 8.9 - 10.3 mg/dL   GFR calc non Af Amer 41 (L) >60 mL/min   GFR calc Af Amer 47 (L) >60 mL/min   Anion gap 9 5 - 15    Comment: Performed at [facility], [place] [address] [place], [place] [address]  CBC     Status: Abnormal   Collection Time: 10/24/18  7:00 PM  Result Value Ref Range   WBC 12.3 (H) 4.0 - 10.5 K/uL   RBC 3.25 (L) 3.87 - 5.11 MIL/uL   Hemoglobin 10.1 (L) 12.0 - 15.0 g/dL   HCT 32.6 (L)  36.0 - 46.0 %   MCV 100.3 (H) 80.0 - 100.0 fL   MCH 31.1 26.0 - 34.0 pg   MCHC 31.0 30.0 - 36.0 g/dL   RDW 14.5 11.5 - 15.5 %   Platelets 232 150 - 400 K/uL   nRBC 0.0 0.0 - 0.2 %    Comment: Performed at [facility], [place] [address] [place], [place] [address]  Creatinine, serum     Status: Abnormal   Collection Time: 10/24/18  7:00 PM  Result Value Ref Range   Creatinine, Ser 1.16 (H) 0.44 - 1.00 mg/dL   GFR calc non Af Amer 44 (L) >60 mL/min   GFR calc Af Amer 50 (L) >60 mL/min    Comment: Performed at [facility], [place] [address], [place], [place] [address]  Magnesium     Status: None   Collection Time: 10/24/18  7:00 PM  Result Value Ref Range   Magnesium 2.0 1.7 - 2.4 mg/dL    Comment: Performed at [facility], [place] [address] [place], [place] [address]  Phosphorus     Status: None   Collection Time: 10/24/18  7:00 PM  Result Value Ref Range   Phosphorus 4.1 2.5 - 4.6 mg/dL    Comment: Performed at [facility], [place] [address] [place], [place] [address]  Troponin I - Now Then Q3H     Status: None   Collection Time: 10/24/18  7:00 PM  Result Value Ref Range   Troponin I <0.03 <0.03 ng/mL    Comment: Performed at [facility], [place] [address] [place],  [address]    Dg Chest 1 View  Result Date: 10/24/2018 CLINICAL DATA:  85 year old female with a fall and hip pain EXAM: CHEST  1 VIEW COMPARISON:  11/19/2016  FINDINGS: Cardiomediastinal silhouette unchanged in size and contour. Low lung volumes with linear opacities at the lung bases. No pneumothorax or pleural effusion. No confluent airspace disease. Changes of prior left shoulder arthroplasty. Degenerative changes of the right shoulder. IMPRESSION: Chronic changes without evidence of acute cardiopulmonary disease Electronically Signed   By: Corrie Mckusick D.O.   On: 10/24/2018 16:37   Ct Head Wo Contrast  Result Date: 10/24/2018 CLINICAL DATA:  Fall 3-4 days ago.  Hit right-side of head. EXAM: CT HEAD WITHOUT CONTRAST TECHNIQUE: Contiguous axial images were obtained from the base of the skull through the vertex without intravenous contrast. COMPARISON:  CT head dated August 21, 2015. FINDINGS: Brain: No evidence of acute infarction, hemorrhage, hydrocephalus, extra-axial collection or mass lesion/mass effect. Stable moderate atrophy and chronic microvascular ischemic changes. Vascular: Calcified atherosclerosis at the skullbase. No hyperdense vessel. Skull: Negative for fracture or focal lesion. Old left zygomatic arch fracture. Sinuses/Orbits: No acute finding. Other: None. IMPRESSION: 1. No acute intracranial abnormality. Stable moderate atrophy and chronic microvascular ischemic changes. Electronically Signed   By: Titus Dubin M.D.   On: 10/24/2018 17:49   Dg Hip Unilat W Or Wo Pelvis 2-3 Views Right  Result Date: 10/24/2018 CLINICAL DATA:  85 year old who fell 3 days ago and complains of persistent RIGHT hip and RIGHT UPPER leg pain. Initial encounter. EXAM: DG HIP (WITH OR WITHOUT PELVIS) 2-3V RIGHT COMPARISON:  Bone window images from CT abdomen and pelvis 11/15/2016. FINDINGS: Acute comminuted intertrochanteric RIGHT femoral neck fracture. RIGHT hip joint anatomically aligned with minimal to mild joint space narrowing. Symmetric minimal to mild joint space narrowing in the contralateral LEFT hip. Sacroiliac joints and symphysis  pubis anatomically  aligned without diastasis and demonstrating degenerative changes. Osseous demineralization. Degenerative changes involving the visualized LOWER lumbar spine. IMPRESSION: Acute comminuted intertrochanteric RIGHT femoral neck fracture. Electronically Signed   By: Evangeline Dakin M.D.   On: 10/24/2018 16:52   Dg Femur Min 2 Views Right  Result Date: 10/24/2018 CLINICAL DATA:  85 year old who fell 3 days ago and complains of persistent RIGHT hip and RIGHT UPPER leg pain. Initial encounter. EXAM: RIGHT FEMUR 2 VIEWS COMPARISON:  None. These images are read in conjunction with the RIGHT hip x-rays obtained concurrently. FINDINGS: The intertrochanteric RIGHT femoral neck fracture is not included on these images. No fractures elsewhere involving the RIGHT femur. Osseous demineralization. Severe tricompartment osteoarthritis involving the knee. IMPRESSION: 1. No fractures elsewhere involving the RIGHT femur. 2. Osseous demineralization. 3. Severe tricompartment osteoarthritis involving the knee. Electronically Signed   By: Evangeline Dakin M.D.   On: 10/24/2018 16:53    ROS Blood pressure (!) 176/72, pulse (!) 106, temperature 98.3 F (36.8 C), temperature source Oral, resp. rate 20, height 5\' 3"  (1.6 m), weight 63.5 kg, SpO2 90 %. Physical Exam Physical Examination: General appearance - alert, well appearing, and in no distress Mental status - alert, oriented to person, place, and time Chest - clear to auscultation, no wheezes, rales or rhonchi, symmetric air entry Heart - normal rate, regular rhythm, normal S1, S2, no murmurs, rubs, clicks or gallops Abdomen - soft, nontender, nondistended, no masses or organomegaly Neurological - alert, oriented, normal speech, no focal findings or movement disorder noted Right lower extremity is shortened and externally rotated. SHe is able to wiggle her toes and plantar/dorsiflex the foot. Sensation is intact. Foot is warm and pink  X-rays- Displaced right  intertrochanteric femur fracture  Assessment/Plan: Right intertrochanteric femur fracture- She has a displaced fracture which will require operative fixation in order for her to have the potential to become ambulatory again. 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 We discussed treatment options and will proceed with intramedullary nailing. Discussed in detail with the patient who elects to proceed.  Pilar Plate Les [place] 10/24/2018, 7:36 PM

## 2018-10-24 NOTE — Plan of Care (Signed)
  Problem: Education: Goal: Knowledge of General Education information will improve Description Including pain rating scale, medication(s)/side effects and non-pharmacologic comfort measures Outcome: Progressing   Problem: Health Behavior/Discharge Planning: Goal: Ability to manage health-related needs will improve Outcome: Progressing   Problem: Clinical Measurements: Goal: Ability to maintain clinical measurements within normal limits will improve Outcome: Progressing Goal: Will remain free from infection Outcome: Progressing Goal: Diagnostic test results will improve Outcome: Progressing Goal: Respiratory complications will improve Outcome: Progressing Goal: Cardiovascular complication will be avoided Outcome: Progressing   Problem: Activity: Goal: Risk for activity intolerance will decrease Outcome: Progressing   Problem: Nutrition: Goal: Adequate nutrition will be maintained Outcome: Progressing   Problem: Coping: Goal: Level of anxiety will decrease Outcome: Progressing   Problem: Elimination: Goal: Will not experience complications related to bowel motility Outcome: Progressing Goal: Will not experience complications related to urinary retention Outcome: Progressing   Problem: Pain Managment: Goal: General experience of comfort will improve Outcome: Progressing   Problem: Safety: Goal: Ability to remain free from injury will improve Outcome: Progressing   Problem: Skin Integrity: Goal: Risk for impaired skin integrity will decrease Outcome: Progressing  Discussed plan of care with patient

## 2018-10-24 NOTE — ED Provider Notes (Signed)
[facility] DEPT Provider Note   CSN: [id number] Arrival date & time: 10/24/18  1526    History   Chief Complaint Chief Complaint  Patient presents with   Knee Pain    HPI Brandy [name] is a 85 y.o. female.     85 yo F with a chief complaint of a fall.  This happened about 3 or 4 days ago.  She was walking backwards and she lost her balance and fell down.  Struck the right side of her head and the right leg.  Has had pain to the area but was able to walk until about 3 hours ago when she felt a pop.  Since then she has not been able to bear weight on the leg.  Called 911.  She denies chest pain or shortness of breath denies abdominal pain denies back pain.  The history is provided by the patient.  Knee Pain  Associated symptoms: no fever   Fall  This is a new problem. The current episode started more than 2 days ago. The problem occurs constantly. The problem has been rapidly worsening. Pertinent negatives include no chest pain, no abdominal pain, no headaches and no shortness of breath. The symptoms are aggravated by bending and twisting. Nothing relieves the symptoms. She has tried nothing for the symptoms. The treatment provided no relief.    Past Medical History:  Diagnosis Date   Anemia    Arthritis    shoulders   Asthma    mild, no inhalers used   Breast cancer ([name]) 12/18/11   left breast masectomy=metastatic ca in (1/1) lymph node ,invasive ductal ca,2 foci,,dcis,lymph ovascular invasion identified,surgical resection margins neg for ca,additional tissue=benign skin and subcutaneous tissue   Bronchitis    hx of;last time >2yr ago   Depression    takes Celexa daily   Difficult intubation    Dysphagia    Full dentures    Gall stones 2015   GERD (gastroesophageal reflux disease)    takes Prilosec prn   H/O hiatal hernia    History of kidney stones    many yrs ago   Hx of radiation therapy 04/26/12 -06/10/12   left  breast   Hyperlipidemia    takes Simvastatin daily   Hypertension    takes Amlodipine and Diovan daily   Insomnia    takes Ambien nightly   Urinary urgency     Patient Active Problem List   Diagnosis Date Noted   Acute on chronic diastolic CHF (congestive heart failure) ([place]) 01/19/2017   HCAP (healthcare-associated pneumonia) 12/11/2016   S/P exploratory laparotomy 11/17/2016   Small bowel obstruction ([place]) 11/17/2016   Pre-operative cardiovascular examination    Incarcerated incisional hernia s/p SB resection & repair 11/17/2016 11/15/2016   Acute renal failure (ARF) ([place]) 11/15/2016   Hyperglycemia 11/15/2016   Asthma without status asthmaticus 09/24/2016   Carpal tunnel syndrome 09/24/2016   Asthma 09/24/2016   Dark stools 09/24/2016   Degenerative arthritis of lumbar spine 09/24/2016   DJD (degenerative joint disease), cervical 09/24/2016   Dyspnea on exertion 09/24/2016   Generalized anxiety disorder 09/24/2016   Lumbosacral spondylosis without myelopathy 09/24/2016   Morbid (severe) obesity due to excess calories ([place]) 09/24/2016   Pernicious anemia 09/24/2016   Pure hypercholesterolemia 09/24/2016   Right knee pain 09/24/2016   Sleep disorder 09/24/2016   Vitamin B 12 deficiency 09/24/2016   Vitamin D deficiency 09/24/2016   Gastric ulcer    Iron deficiency anemia due to  chronic blood loss    Abdominal pain, chronic, epigastric    Acute gastric ulcer without hemorrhage or perforation    Duodenal ulcer    Closed fracture of nasal septum 08/27/2015   Closed fracture of zygomatic arch ([place]) 08/27/2015   Protein-calorie malnutrition, severe ([place]) 12/18/2013   Choledocholithiasis 12/17/2013   Sepsis ([place]) 12/17/2013   Osteopenia 11/17/2012   Moderate aortic stenosis 10/17/2012   Hx of radiation therapy    Depression    History of kidney stones    Urinary urgency    Gastroesophageal reflux disease without esophagitis      Hyperlipidemia    Essential hypertension    Bronchitis    Arthritis    Dysphagia    H/O hiatal hernia    Primary cancer of upper outer quadrant of left female breast ([place]) 12/01/2011    Past Surgical History:  Procedure Laterality Date   APPENDECTOMY     bladder tack     BREAST BIOPSY  1998   left    DILATION AND CURETTAGE OF UTERUS     ENDOSCOPIC RETROGRADE CHOLANGIOPANCREATOGRAPHY (ERCP) WITH PROPOFOL N/A 02/01/2014   Procedure: ENDOSCOPIC RETROGRADE CHOLANGIOPANCREATOGRAPHY (ERCP) WITH PROPOFOL;  Surgeon: [name], MD;  Location: [facility];  Service: Endoscopy;  Laterality: N/A;   ESOPHAGOGASTRODUODENOSCOPY     ESOPHAGOGASTRODUODENOSCOPY N/A 06/26/2016   Procedure: ESOPHAGOGASTRODUODENOSCOPY (EGD);  Surgeon: [name], MD;  Location: [facility];  Service: Endoscopy;  Laterality: N/A;   ESOPHAGOGASTRODUODENOSCOPY (EGD) WITH PROPOFOL  12/19/2013   Procedure: ESOPHAGOGASTRODUODENOSCOPY (EGD) WITH PROPOFOL;  Surgeon: Beryle Beams, MD;  Location: [facility];  Service: Endoscopy;;   ESOPHAGOGASTRODUODENOSCOPY (EGD) WITH PROPOFOL N/A 09/10/2016   Procedure: ESOPHAGOGASTRODUODENOSCOPY (EGD) WITH PROPOFOL;  Surgeon: [name], MD;  Location: [facility];  Service: Endoscopy;  Laterality: N/A;   EUS  06/04/2011   Procedure: UPPER ENDOSCOPIC ULTRASOUND (EUS) LINEAR;  Surgeon: Owens Loffler, MD;  Location: [facility];  Service: Endoscopy;  Laterality: N/A;   EUS N/A 02/01/2014   Procedure: UPPER ENDOSCOPIC ULTRASOUND (EUS) LINEAR;  Surgeon: [name], MD;  [facility]: [facility];  Service: Endoscopy;  Laterality: N/A;   EXPLORATORY LAPAROTOMY      biopsy of intra-abdominal mass   LAPAROTOMY N/A 11/17/2016   Procedure: EXPLORATORY LAPAROTOMY WITH LYSIS OF ADHESIONS, SMALL BOWEL RESECTION, REPAIR OF INCARCERATED VENTRAL INCISIONAL HERNIA, INSERTION OF BIOLOGIC MESH PATCH,APPLICATION OF WOUND VAC DRESSING;  Surgeon: Armandina Gemma, MD;   Location: [facility];  Service: General;  Laterality: N/A;   MASTECTOMY W/ SENTINEL NODE BIOPSY  12/18/2011   Procedure: MASTECTOMY WITH SENTINEL LYMPH NODE BIOPSY;  Surgeon: Rolm Bookbinder, MD;  Location: [facility];  Service: General;  Laterality: Left;   PORT-A-CATH REMOVAL Right 06/15/2013   Procedure: REMOVAL PORT-A-CATH;  Surgeon: Rolm Bookbinder, MD;  Location: [facility];  Service: General;  Laterality: Right;   PORTACATH PLACEMENT  01/27/2012   Procedure: INSERTION PORT-A-CATH;  Surgeon: Rolm Bookbinder, MD;  Location: [facility];  Service: General;  Laterality: Right;  PORT PLACEMENT   TOTAL SHOULDER REPLACEMENT  2011   left   TUBAL LIGATION       OB History   No obstetric history on file.      Home Medications    Prior to Admission medications   Medication Sig Start Date End Date Taking? Authorizing Provider  acetaminophen (TYLENOL) 500 MG tablet Take 1,000 mg by mouth every 6 (six) hours as needed (pain).    [provider]  Amino Acids-Protein Hydrolys (  FEEDING SUPPLEMENT, PRO-STAT SUGAR FREE 64,) LIQD Take 30 mLs by mouth 2 (two) times daily.    [provider]  amLODipine (NORVASC) 5 MG tablet Take 1 tablet (5 mg total) by mouth daily. 01/19/17 04/19/17  Imogene Burn, PA-C  atorvastatin (LIPITOR) 40 MG tablet Take 1 tablet by mouth daily. 04/27/16   [provider]  citalopram (CELEXA) 40 MG tablet Take 1 tablet by mouth daily. 06/01/16   [provider]  docusate sodium (COLACE) 100 MG capsule Take 1 capsule (100 mg total) by mouth daily. 11/26/16   Theodis Blaze, MD  ferrous sulfate 325 (65 FE) MG tablet Take 325 mg by mouth daily with breakfast.    [provider]  gabapentin (NEURONTIN) 300 MG capsule TAKE 1 CAPSULE BY MOUTH EVERYDAY AT BEDTIME 10/24/18   Nicholas Lose, MD  hydrochlorothiazide (HYDRODIURIL) 25 MG tablet Take 1 tablet (25 mg total) by mouth daily. 01/19/17 04/19/17  Imogene Burn, PA-C  letrozole [facility]) 2.5 MG tablet Take 1 tablet (2.5 mg total) by mouth daily. 12/04/16   Nicholas Lose, MD  omeprazole (PRILOSEC) 40 MG capsule Take 40 mg by mouth daily.    [provider]  omeprazole (PRILOSEC) 40 MG capsule TAKE 1 CAPSULE (40 MG TOTAL) BY MOUTH 2 (TWO) TIMES DAILY BEFORE A MEAL. 05/20/18   [name], MD  ondansetron (ZOFRAN) 4 MG tablet Take 4 mg by mouth daily.    [provider]  Saccharomyces boulardii (FLORASTOR PO) Take by mouth. Give one capsule two times a day for Prophylaxis    [provider]  spironolactone (ALDACTONE) 25 MG tablet Take 0.5 tablets (12.5 mg total) by mouth daily. Please make appt for future refills FINAL ATTEMPT 08/29/18   Dorothy Spark, MD  Vitamin D, Ergocalciferol, (DRISDOL) 50000 units CAPS capsule Take 50,000 Units by mouth every 7 (seven) days. Every Friday    [provider]    Family History Family History  Problem Relation Age of Onset   Prostate cancer Father    Breast cancer Other     Social History Social History   Tobacco Use   Smoking status: Never Smoker   Smokeless tobacco: Never Used  Substance Use Topics   Alcohol use: No   Drug use: No     Allergies   Patient has no known allergies.   Review of Systems Review of Systems  Constitutional: Negative for chills and fever.  HENT: Negative for congestion and rhinorrhea.   Eyes: Negative for redness and visual disturbance.  Respiratory: Negative for shortness of breath and wheezing.   Cardiovascular: Negative for chest pain and palpitations.  Gastrointestinal: Negative for abdominal pain, nausea and vomiting.  Genitourinary: Negative for dysuria and urgency.  Musculoskeletal: Positive for arthralgias and gait problem. Negative for myalgias.  Skin: Negative for pallor and wound.  Neurological: Negative for dizziness and headaches.     Physical Exam Updated Vital Signs BP 137/79    Pulse 96     Temp 97.9 F (36.6 C) (Oral)    Resp 18    SpO2 91%   Physical Exam Vitals signs and nursing note reviewed.  Constitutional:      General: She is not in acute distress.    Appearance: She is well-developed. She is not diaphoretic.  HENT:     Head: Normocephalic and atraumatic.  Eyes:     Pupils: Pupils are equal, round, and reactive to light.  Neck:     Musculoskeletal: Normal  range of motion and neck supple.  Cardiovascular:     Rate and Rhythm: Normal rate and regular rhythm.     Heart sounds: No murmur. No friction rub. No gallop.   Pulmonary:     Effort: Pulmonary effort is normal.     Breath sounds: No wheezing or rales.  Abdominal:     General: There is no distension.     Palpations: Abdomen is soft.     Tenderness: There is no abdominal tenderness.  Musculoskeletal:        General: Tenderness and deformity present.     Comments: Shortening and external rotation of the right leg.  PMS intact distally.  No noted midline spinal tenderness. Pain with internal and external rotation of the right leg. ROM of the knee without significant pain or edema.   Skin:    General: Skin is warm and dry.  Neurological:     Mental Status: She is alert and oriented to person, place, and time.  Psychiatric:        Behavior: Behavior normal.      ED Treatments / Results  Labs (all labs ordered are listed, but only abnormal results are displayed) Labs Reviewed  CBC WITH DIFFERENTIAL/PLATELET - Abnormal; Notable for the following components:      Result Value   WBC 11.2 (*)    RBC 3.13 (*)    Hemoglobin 9.7 (*)    HCT 31.4 (*)    MCV 100.3 (*)    Neutro Abs 8.1 (*)    Abs Immature Granulocytes 0.08 (*)    All other components within normal limits  BASIC METABOLIC PANEL - Abnormal; Notable for the following components:   Glucose, Bld 135 (*)    BUN 32 (*)    Creatinine, Ser 1.23 (*)    Calcium 8.8 (*)    GFR calc non Af Amer 41 (*)    GFR calc Af Amer 47 (*)    All other  components within normal limits    EKG EKG Interpretation  Date/Time:  Monday October 24 2018 15:43:57 EDT Ventricular Rate:  95 PR Interval:    QRS Duration: 88 QT Interval:  362 QTC Calculation: 456 R Axis:   52 Text Interpretation:  Sinus rhythm Probable LVH with secondary repol abnrm diffuse scooped st segments.  seen on prior Otherwise no significant change Confirmed by Deno Etienne [phone]) on 10/24/2018 3:46:17 PM   Radiology Dg Chest 1 View  Result Date: 10/24/2018 CLINICAL DATA:  85 year old female with a fall and hip pain EXAM: CHEST  1 VIEW COMPARISON:  11/19/2016 FINDINGS: Cardiomediastinal silhouette unchanged in size and contour. Low lung volumes with linear opacities at the lung bases. No pneumothorax or pleural effusion. No confluent airspace disease. Changes of prior left shoulder arthroplasty. Degenerative changes of the right shoulder. IMPRESSION: Chronic changes without evidence of acute cardiopulmonary disease Electronically Signed   By: Corrie Mckusick D.O.   On: 10/24/2018 16:37   Dg Hip Unilat W Or Wo Pelvis 2-3 Views Right  Result Date: 10/24/2018 CLINICAL DATA:  85 year old who fell 3 days ago and complains of persistent RIGHT hip and RIGHT UPPER leg pain. Initial encounter. EXAM: DG HIP (WITH OR WITHOUT PELVIS) 2-3V RIGHT COMPARISON:  Bone window images from CT abdomen and pelvis 11/15/2016. FINDINGS: Acute comminuted intertrochanteric RIGHT femoral neck fracture. RIGHT hip joint anatomically aligned with minimal to mild joint space narrowing. Symmetric minimal to mild joint space narrowing in the contralateral LEFT hip. Sacroiliac joints and symphysis pubis  anatomically aligned without diastasis and demonstrating degenerative changes. Osseous demineralization. Degenerative changes involving the visualized LOWER lumbar spine. IMPRESSION: Acute comminuted intertrochanteric RIGHT femoral neck fracture. Electronically Signed   By: Evangeline Dakin M.D.   On: 10/24/2018 16:52    Dg Femur Min 2 Views Right  Result Date: 10/24/2018 CLINICAL DATA:  85 year old who fell 3 days ago and complains of persistent RIGHT hip and RIGHT UPPER leg pain. Initial encounter. EXAM: RIGHT FEMUR 2 VIEWS COMPARISON:  None. These images are read in conjunction with the RIGHT hip x-rays obtained concurrently. FINDINGS: The intertrochanteric RIGHT femoral neck fracture is not included on these images. No fractures elsewhere involving the RIGHT femur. Osseous demineralization. Severe tricompartment osteoarthritis involving the knee. IMPRESSION: 1. No fractures elsewhere involving the RIGHT femur. 2. Osseous demineralization. 3. Severe tricompartment osteoarthritis involving the knee. Electronically Signed   By: Evangeline Dakin M.D.   On: 10/24/2018 16:53    Procedures Procedures (including critical care time)  Medications Ordered in ED Medications  acetaminophen (TYLENOL) tablet 1,000 mg (1,000 mg Oral Given 10/24/18 1601)  morphine 2 MG/ML injection 2 mg (2 mg Intravenous Given 10/24/18 1610)  0.9 %  sodium chloride infusion ( Intravenous New Bag/Given 10/24/18 1613)     Initial Impression / Assessment and Plan / ED Course  I have reviewed the triage vital signs and the nursing notes.  Pertinent labs & imaging results that were available during my care of the patient were reviewed by me and considered in my medical decision making (see chart for details).        85 yo F with a cc of a fall.  Mechanical in nature.  Exam consistent with hip fracture.  Will obtain labs, ecg, cxr, fluids pain meds. Will obtain a CT of the head for hitting her head on Friday, doubt clinically significant ich if no significant symptoms and 4 days post fall.   Patient's lab work with mild anemia, mild bump in her creatinine.  Plain film of the hip with a intertrochanteric displaced fracture.  Will discuss with orthopedics.  I discussed case with Dr. Wynelle Link, orthopedics he would come evaluate the patient this  afternoon and try getting to the OR this afternoon if not he is in the office tomorrow and would likely operate tomorrow afternoon or the next morning.   The patients results and plan were reviewed and discussed.   Any x-rays performed were independently reviewed by myself.   Differential diagnosis were considered with the presenting HPI.  Medications  acetaminophen (TYLENOL) tablet 1,000 mg (1,000 mg Oral Given 10/24/18 1601)  morphine 2 MG/ML injection 2 mg (2 mg Intravenous Given 10/24/18 1610)  0.9 %  sodium chloride infusion ( Intravenous New Bag/Given 10/24/18 1613)    Vitals:   10/24/18 1543  BP: 137/79  Pulse: 96  Resp: 18  Temp: 97.9 F (36.6 C)  TempSrc: Oral  SpO2: 91%    Final diagnoses:  Intertrochanteric fracture of right hip, closed, initial encounter [facility])    Admission/ observation were discussed with the admitting physician, patient and/or family and they are comfortable with the plan.    Final Clinical Impressions(s) / ED Diagnoses   Final diagnoses:  Intertrochanteric fracture of right hip, closed, initial encounter [facility])    ED Discharge Orders    None       Deno Etienne, DO 10/24/18 1711

## 2018-10-24 NOTE — H&P (Signed)
History and Physical  Brandy [name] XBM:[id number] DOB: 1933-06-16 DOA: 10/24/2018  Referring physician: ER physician PCP: Kathyrn Lass, MD  Outpatient Specialists:    Patient coming from: Home  Chief Complaint: Fall with resultant right hip fracture  HPI: 85 year old female past medical history significant for hypertension, hyperlipidemia, moderate aortic stenosis as per echocardiogram done in 2018, nephrolithiasis, GERD, breast cancer, asthma, arthritis and anemia.  Patient normally walks around with a walker.  Patient reports falling every 2 to 3 months.  Patient tripped and fell 3 days ago was backing out of the bathroom.  Following the fall, patient developed right hip pain but did not come to the hospital or see any provider.  Earlier today, patient heard a "pop", with associated worsening right hip pain and decided to come to the hospital.  No chest pain, no shortness of breath, no limitation of mobility prior to falls, no dyspnea on exertion, no leg edema, no headache, no neck pain, no fever or chills, no URI symptoms, no GI symptoms or urinary symptoms.  The ER provider has already discussed with the orthopedic doctor, Dr. Wynelle Link.  The plan is to proceed with surgery as soon as possible.  ED Course: Orthopedic team was consulted.  Hospitalist team was asked to admit patient.   Pertinent labs: Chemistry reveals sodium of 140, potassium of 3.8, chloride 108, CO2 23, BUN of 32 with creatinine of 1.23.  Blood sugar is 135.  Magnesium is 2 and phosphorus is 4.1.  First troponin is less than 0.03.  CBC reveals WBC of 12.3, hemoglobin of 10.1, hematocrit 32.6, MCV of 100.3 with platelet count of 232.  EKG: Independently reviewed.   Imaging: independently reviewed.   Review of Systems:  Negative for fever, visual changes, sore throat, rash, new muscle aches, chest pain, SOB, dysuria, bleeding, n/v/abdominal pain.  Past Medical History:  Diagnosis Date   Anemia    Arthritis    shoulders    Asthma    mild, no inhalers used   Breast cancer ([place]) 12/18/11   left breast masectomy=metastatic ca in (1/1) lymph node ,invasive ductal ca,2 foci,,dcis,lymph ovascular invasion identified,surgical resection margins neg for ca,additional tissue=benign skin and subcutaneous tissue   Bronchitis    hx of;last time >35yr ago   Depression    takes Celexa daily   Difficult intubation    Dysphagia    Full dentures    Gall stones 2015   GERD (gastroesophageal reflux disease)    takes Prilosec prn   H/O hiatal hernia    History of kidney stones    many yrs ago   Hx of radiation therapy 04/26/12 -06/10/12   left breast   Hyperlipidemia    takes Simvastatin daily   Hypertension    takes Amlodipine and Diovan daily   Insomnia    takes Ambien nightly   Urinary urgency     Past Surgical History:  Procedure Laterality Date   APPENDECTOMY     bladder tack     BREAST BIOPSY  1998   left    DILATION AND CURETTAGE OF UTERUS     ENDOSCOPIC RETROGRADE CHOLANGIOPANCREATOGRAPHY (ERCP) WITH PROPOFOL N/A 02/01/2014   Procedure: ENDOSCOPIC RETROGRADE CHOLANGIOPANCREATOGRAPHY (ERCP) WITH PROPOFOL;  Surgeon: [name], MD;  Location: [facility];  Service: Endoscopy;  Laterality: N/A;   ESOPHAGOGASTRODUODENOSCOPY     ESOPHAGOGASTRODUODENOSCOPY N/A 06/26/2016   Procedure: ESOPHAGOGASTRODUODENOSCOPY (EGD);  Surgeon: [name], MD;  Location: [place];  Service: Endoscopy;  Laterality: N/A;   ESOPHAGOGASTRODUODENOSCOPY (  EGD) WITH PROPOFOL  12/19/2013   Procedure: ESOPHAGOGASTRODUODENOSCOPY (EGD) WITH PROPOFOL;  Surgeon: Beryle Beams, MD;  Location: [place];  Service: Endoscopy;;   ESOPHAGOGASTRODUODENOSCOPY (EGD) WITH PROPOFOL N/A 09/10/2016   Procedure: ESOPHAGOGASTRODUODENOSCOPY (EGD) WITH PROPOFOL;  Surgeon: [name], MD;  Location: [facility];  Service: Endoscopy;  Laterality: N/A;   EUS  06/04/2011   Procedure: UPPER ENDOSCOPIC ULTRASOUND  (EUS) LINEAR;  Surgeon: Owens Loffler, MD;  Location: [facility];  Service: Endoscopy;  Laterality: N/A;   EUS N/A 02/01/2014   Procedure: UPPER ENDOSCOPIC ULTRASOUND (EUS) LINEAR;  Surgeon: [name], MD;  Location: [facility];  Service: Endoscopy;  Laterality: N/A;   EXPLORATORY LAPAROTOMY      biopsy of intra-abdominal mass   LAPAROTOMY N/A 11/17/2016   Procedure: EXPLORATORY LAPAROTOMY WITH LYSIS OF ADHESIONS, SMALL BOWEL RESECTION, REPAIR OF INCARCERATED VENTRAL INCISIONAL HERNIA, INSERTION OF BIOLOGIC MESH PATCH,APPLICATION OF WOUND VAC DRESSING;  Surgeon: Armandina Gemma, MD;  Location: [facility];  Service: General;  Laterality: N/A;   MASTECTOMY W/ SENTINEL NODE BIOPSY  12/18/2011   Procedure: MASTECTOMY WITH SENTINEL LYMPH NODE BIOPSY;  Surgeon: Rolm Bookbinder, MD;  Location: [place];  Service: General;  Laterality: Left;   PORT-A-CATH REMOVAL Right 06/15/2013   Procedure: REMOVAL PORT-A-CATH;  Surgeon: Rolm Bookbinder, MD;  Location: [place];  Service: General;  Laterality: Right;   PORTACATH PLACEMENT  01/27/2012   Procedure: INSERTION PORT-A-CATH;  Surgeon: Rolm Bookbinder, MD;  Location: [facility];  Service: General;  Laterality: Right;  PORT PLACEMENT   TOTAL SHOULDER REPLACEMENT  2011   left   TUBAL LIGATION       reports that she has never smoked. She has never used smokeless tobacco. She reports that she does not drink alcohol or use drugs.  No Known Allergies  Family History  Problem Relation Age of Onset   Prostate cancer Father    Breast cancer Other      Prior to Admission medications   Medication Sig Start Date End Date Taking? Authorizing Provider  acetaminophen (TYLENOL) 500 MG tablet Take 1,000 mg by mouth every 6 (six) hours as needed (pain).    [provider]  Amino Acids-Protein Hydrolys (FEEDING SUPPLEMENT, PRO-STAT SUGAR FREE 64,) LIQD Take 30 mLs by mouth 2 (two) times daily.    [provider]  amLODipine (NORVASC) 5 MG tablet Take 1 tablet (5 mg total) by mouth daily. 01/19/17 04/19/17  Imogene Burn, PA-C  atorvastatin (LIPITOR) 40 MG tablet Take 1 tablet by mouth daily. 04/27/16   [provider]  citalopram (CELEXA) 40 MG tablet Take 1 tablet by mouth daily. 06/01/16   [provider]  docusate sodium (COLACE) 100 MG capsule Take 1 capsule (100 mg total) by mouth daily. 11/26/16   Theodis Blaze, MD  ferrous sulfate 325 (65 FE) MG tablet Take 325 mg by mouth daily with breakfast.    [provider]  gabapentin (NEURONTIN) 300 MG capsule TAKE 1 CAPSULE BY MOUTH EVERYDAY AT BEDTIME 10/24/18   Nicholas Lose, MD  hydrochlorothiazide (HYDRODIURIL) 25 MG tablet Take 1 tablet (25 mg total) by mouth daily. 01/19/17 04/19/17  Imogene Burn, PA-C  letrozole [facility]) 2.5 MG tablet Take 1 tablet (2.5 mg total) by mouth daily. 12/04/16   Nicholas Lose, MD  omeprazole (PRILOSEC) 40 MG capsule Take 40 mg by mouth daily.    [provider]  omeprazole (PRILOSEC) 40 MG capsule TAKE 1 CAPSULE (40 MG TOTAL)  BY MOUTH 2 (TWO) TIMES DAILY BEFORE A MEAL. 05/20/18   [name], MD  ondansetron (ZOFRAN) 4 MG tablet Take 4 mg by mouth daily.    [provider]  Saccharomyces boulardii (FLORASTOR PO) Take by mouth. Give one capsule two times a day for Prophylaxis    [provider]  spironolactone (ALDACTONE) 25 MG tablet Take 0.5 tablets (12.5 mg total) by mouth daily. Please make appt for future refills FINAL ATTEMPT 08/29/18   Dorothy Spark, MD  Vitamin D, Ergocalciferol, (DRISDOL) 50000 units CAPS capsule Take 50,000 Units by mouth every 7 (seven) days. Every Friday    [provider]    Physical Exam: Vitals:   10/24/18 1543  BP: 137/79  Pulse: 96  Resp: 18  Temp: 97.9 F (36.6 C)  TempSrc: Oral  SpO2: 91%    Constitutional:   Appears calm and comfortable. Eyes:   Mild pallor. No jaundice.  ENMT:    external ears, nose appear normal Neck:   Neck is supple. No JVD Respiratory:   CTA bilaterally, no w/r/r.   Respiratory effort normal. No retractions or accessory muscle use Cardiovascular:   S1S2, with ESM  No LE extremity edema   Abdomen:   Abdomen is soft and non tender. Organs are difficult to assess. Neurologic:   Awake and alert.  Moves all limbs.  Wt Readings from Last 3 Encounters:  04/06/17 71.7 kg  02/23/17 71.8 kg  01/19/17 72.2 kg    I have personally reviewed following labs and imaging studies  Labs on Admission:  CBC: Recent Labs  Lab 10/24/18 1546  WBC 11.2*  NEUTROABS 8.1*  HGB 9.7*  HCT 31.4*  MCV 100.3*  PLT 657   Basic Metabolic Panel: Recent Labs  Lab 10/24/18 1546  NA 140  K 3.8  CL 108  CO2 23  GLUCOSE 135*  BUN 32*  CREATININE 1.23*  CALCIUM 8.8*   Liver Function Tests: No results for input(s): AST, ALT, ALKPHOS, BILITOT, PROT, ALBUMIN in the last 168 hours. No results for input(s): LIPASE, AMYLASE in the last 168 hours. No results for input(s): AMMONIA in the last 168 hours. Coagulation Profile: No results for input(s): INR, PROTIME in the last 168 hours. Cardiac Enzymes: No results for input(s): CKTOTAL, CKMB, CKMBINDEX, TROPONINI in the last 168 hours. BNP (last 3 results) No results for input(s): PROBNP in the last 8760 hours. HbA1C: No results for input(s): HGBA1C in the last 72 hours. CBG: No results for input(s): GLUCAP in the last 168 hours. Lipid Profile: No results for input(s): CHOL, HDL, LDLCALC, TRIG, CHOLHDL, LDLDIRECT in the last 72 hours. Thyroid Function Tests: No results for input(s): TSH, T4TOTAL, FREET4, T3FREE, THYROIDAB in the last 72 hours. Anemia Panel: No results for input(s): VITAMINB12, FOLATE, FERRITIN, TIBC, IRON, RETICCTPCT in the last 72 hours. Urine analysis:    Component Value Date/Time   COLORURINE AMBER (A) 11/15/2016 2253   APPEARANCEUR CLOUDY (A) 11/15/2016 2253   LABSPEC  1.017 11/15/2016 2253   PHURINE 5.0 11/15/2016 2253   GLUCOSEU NEGATIVE 11/15/2016 2253   HGBUR NEGATIVE 11/15/2016 2253   BILIRUBINUR SMALL (A) 11/15/2016 2253   KETONESUR NEGATIVE 11/15/2016 2253   PROTEINUR NEGATIVE 11/15/2016 2253   UROBILINOGEN 1.0 10/10/2009 1100   NITRITE NEGATIVE 11/15/2016 2253   LEUKOCYTESUR NEGATIVE 11/15/2016 2253   Sepsis Labs: @LABRCNTIP (procalcitonin:4,lacticidven:4) )No results found for this or any previous visit (from the past 240 hour(s)).    Radiological Exams on Admission: Dg Chest 1 View  Result Date: 10/24/2018 CLINICAL DATA:  85 year old female with a fall and hip pain EXAM: CHEST  1 VIEW COMPARISON:  11/19/2016 FINDINGS: Cardiomediastinal silhouette unchanged in size and contour. Low lung volumes with linear opacities at the lung bases. No pneumothorax or pleural effusion. No confluent airspace disease. Changes of prior left shoulder arthroplasty. Degenerative changes of the right shoulder. IMPRESSION: Chronic changes without evidence of acute cardiopulmonary disease Electronically Signed   By: Corrie Mckusick D.O.   On: 10/24/2018 16:37   Dg Hip Unilat W Or Wo Pelvis 2-3 Views Right  Result Date: 10/24/2018 CLINICAL DATA:  85 year old who fell 3 days ago and complains of persistent RIGHT hip and RIGHT UPPER leg pain. Initial encounter. EXAM: DG HIP (WITH OR WITHOUT PELVIS) 2-3V RIGHT COMPARISON:  Bone window images from CT abdomen and pelvis 11/15/2016. FINDINGS: Acute comminuted intertrochanteric RIGHT femoral neck fracture. RIGHT hip joint anatomically aligned with minimal to mild joint space narrowing. Symmetric minimal to mild joint space narrowing in the contralateral LEFT hip. Sacroiliac joints and symphysis pubis anatomically aligned without diastasis and demonstrating degenerative changes. Osseous demineralization. Degenerative changes involving the visualized LOWER lumbar spine. IMPRESSION: Acute comminuted intertrochanteric RIGHT femoral neck  fracture. Electronically Signed   By: Evangeline Dakin M.D.   On: 10/24/2018 16:52   Dg Femur Min 2 Views Right  Result Date: 10/24/2018 CLINICAL DATA:  85 year old who fell 3 days ago and complains of persistent RIGHT hip and RIGHT UPPER leg pain. Initial encounter. EXAM: RIGHT FEMUR 2 VIEWS COMPARISON:  None. These images are read in conjunction with the RIGHT hip x-rays obtained concurrently. FINDINGS: The intertrochanteric RIGHT femoral neck fracture is not included on these images. No fractures elsewhere involving the RIGHT femur. Osseous demineralization. Severe tricompartment osteoarthritis involving the knee. IMPRESSION: 1. No fractures elsewhere involving the RIGHT femur. 2. Osseous demineralization. 3. Severe tricompartment osteoarthritis involving the knee. Electronically Signed   By: Evangeline Dakin M.D.   On: 10/24/2018 16:53    EKG: Independently reviewed.   Active Problems:   * No active hospital problems. *   Assessment/Plan Right hip fracture: Adequate pain control Cycle cardiac enzymes Orthopedic team intends to proceed with surgery No mobility limitations prior to fall, no reported chest pain or shortness of breath. EKG has not shown any new changes. History of moderate aortic stenosis noted, but seems asymptomatic for now. DVT prophylaxis as per orthopedic team  Recurrent falls: PT OT consult  Aortic stenosis mild to moderate as per echo done in 2018: Asymptomatic for now Will need repeat echo Patient is known to her cardiologist.  Hypertension: Continue to optimize.   DVT prophylaxis: Subcutaneous Lovenox Code Status: Full code Family Communication:  Disposition Plan: Will depend on hospital course Consults called: Orthopedic team is already been consulted Admission status: Inpatient  Time spent: 65 minutes  Dana Allan, MD  [facility] Pager #: [phone] 7PM-7AM contact night coverage as above  10/24/2018, 5:10 PM

## 2018-10-24 NOTE — ED Triage Notes (Addendum)
Pt BIB EMS from home, lives with daughter and husband. Pt reports falling Friday 4/17, has been walking since. At 1200 today pt reports hearing "a pop" right knee. Pt not able to walk since then.   BP 132/64 HR 90 Resp 18 93% room air 98.1 temp

## 2018-10-24 NOTE — Anesthesia Procedure Notes (Signed)
Procedure Name: Intubation Performed by: Gean Maidens, CRNA Pre-anesthesia Checklist: Patient identified, Suction available, Emergency Drugs available, Patient being monitored and Timeout performed Patient Re-evaluated:Patient Re-evaluated prior to induction Oxygen Delivery Method: Circle system utilized Preoxygenation: Pre-oxygenation with 100% oxygen Induction Type: IV induction and Rapid sequence Laryngoscope Size: Mac and 3 Grade View: Grade I Tube type: Oral Tube size: 7.0 mm Number of attempts: 1 Airway Equipment and Method: Stylet Placement Confirmation: ETT inserted through vocal cords under direct vision,  positive ETCO2 and breath sounds checked- equal and bilateral Secured at: 21 cm Tube secured with: Tape Dental Injury: Teeth and Oropharynx as per pre-operative assessment

## 2018-10-24 NOTE — ED Notes (Signed)
ED TO INPATIENT HANDOFF REPORT  ED Nurse Name and Phone #:   S Name/Age/Gender Brandy [name] 85 y.o. female Room/Bed: WA14/WA14  Code Status   Code Status: Prior  Home/SNF/Other Home Patient oriented to: self, place, time and situation Is this baseline? Yes   Triage Complete: Triage complete  Chief Complaint Left Knee Pain  Triage Note Pt BIB EMS from home, lives with daughter and husband. Pt reports falling Friday 4/17, has been walking since. At 1200 today pt reports hearing "a pop" right knee. Pt not able to walk since then.   BP 132/64 HR 90 Resp 18 93% room air 98.1 temp   Allergies No Known Allergies  Level of Care/Admitting Diagnosis ED Disposition    ED Disposition Condition Comment   Admit  Hospital Area: [facility] [[id number]]  Level of Care: Med-Surg [16]  Covid Evaluation: N/A  Diagnosis: S/P right hip fracture [[id number]]  Admitting Physician: Shirlean Mylar  Attending Physician: Dana Allan I [[id number]]  Estimated length of stay: past midnight tomorrow  Certification:: I certify this patient will need inpatient services for at least 2 midnights  PT Class (Do Not Modify): Inpatient [101]  PT Acc Code (Do Not Modify): Private [1]       B Medical/Surgery History Past Medical History:  Diagnosis Date  . Anemia   . Arthritis    shoulders  . Asthma    mild, no inhalers used  . Breast cancer ([place]) 12/18/11   left breast masectomy=metastatic ca in (1/1) lymph node ,invasive ductal ca,2 foci,,dcis,lymph ovascular invasion identified,surgical resection margins neg for ca,additional tissue=benign skin and subcutaneous tissue  . Bronchitis    hx of;last time >49yr ago  . Depression    takes Celexa daily  . Difficult intubation   . Dysphagia   . Full dentures   . Gall stones 2015  . GERD (gastroesophageal reflux disease)    takes Prilosec prn  . H/O hiatal hernia   . History of kidney stones    many yrs ago  .  Hx of radiation therapy 04/26/12 -06/10/12   left breast  . Hyperlipidemia    takes Simvastatin daily  . Hypertension    takes Amlodipine and Diovan daily  . Insomnia    takes Ambien nightly  . Urinary urgency    Past Surgical History:  Procedure Laterality Date  . APPENDECTOMY    . bladder tack    . BREAST BIOPSY  1998   left   . DILATION AND CURETTAGE OF UTERUS    . ENDOSCOPIC RETROGRADE CHOLANGIOPANCREATOGRAPHY (ERCP) WITH PROPOFOL N/A 02/01/2014   Procedure: ENDOSCOPIC RETROGRADE CHOLANGIOPANCREATOGRAPHY (ERCP) WITH PROPOFOL;  Surgeon: [name], MD;  [facility]: [facility];  Service: Endoscopy;  Laterality: N/A;  . ESOPHAGOGASTRODUODENOSCOPY    . ESOPHAGOGASTRODUODENOSCOPY N/A 06/26/2016   Procedure: ESOPHAGOGASTRODUODENOSCOPY (EGD);  Surgeon: [name], MD;  Location: [facility];  Service: Endoscopy;  Laterality: N/A;  . ESOPHAGOGASTRODUODENOSCOPY (EGD) WITH PROPOFOL  12/19/2013   Procedure: ESOPHAGOGASTRODUODENOSCOPY (EGD) WITH PROPOFOL;  Surgeon: Beryle Beams, MD;  Location: [facility];  Service: Endoscopy;;  . ESOPHAGOGASTRODUODENOSCOPY (EGD) WITH PROPOFOL N/A 09/10/2016   Procedure: ESOPHAGOGASTRODUODENOSCOPY (EGD) WITH PROPOFOL;  Surgeon: [name], MD;  [facility];  Service: Endoscopy;  Laterality: N/A;  . EUS  06/04/2011   Procedure: UPPER ENDOSCOPIC ULTRASOUND (EUS) LINEAR;  Surgeon: Owens Loffler, MD;  [facility];  Service: Endoscopy;  Laterality: N/A;  . EUS N/A 02/01/2014   Procedure: UPPER ENDOSCOPIC  ULTRASOUND (EUS) LINEAR;  Surgeon: [name], MD;  [facility];  Service: Endoscopy;  Laterality: N/A;  . EXPLORATORY LAPAROTOMY      biopsy of intra-abdominal mass  . LAPAROTOMY N/A 11/17/2016   Procedure: EXPLORATORY LAPAROTOMY WITH LYSIS OF ADHESIONS, SMALL BOWEL RESECTION, REPAIR OF INCARCERATED VENTRAL INCISIONAL HERNIA, INSERTION OF BIOLOGIC MESH PATCH,APPLICATION OF WOUND VAC DRESSING;  Surgeon: Armandina Gemma, MD;  [facility];  Service: General;  Laterality: N/A;  . MASTECTOMY W/ SENTINEL NODE BIOPSY  12/18/2011   Procedure: MASTECTOMY WITH SENTINEL LYMPH NODE BIOPSY;  Surgeon: Rolm Bookbinder, MD;  Location: [facility];  Service: General;  Laterality: Left;  . PORT-A-CATH REMOVAL Right 06/15/2013   Procedure: REMOVAL PORT-A-CATH;  Surgeon: Rolm Bookbinder, MD;  Location: [facility];  Service: General;  Laterality: Right;  . PORTACATH PLACEMENT  01/27/2012   Procedure: INSERTION PORT-A-CATH;  Surgeon: Rolm Bookbinder, MD;  Location: [facility];  Service: General;  Laterality: Right;  PORT PLACEMENT  . TOTAL SHOULDER REPLACEMENT  2011   left  . TUBAL LIGATION       A IV Location/Drains/Wounds Patient Lines/Drains/Airways Status   Active Line/Drains/Airways    Name:   Placement date:   Placement time:   Site:   Days:   Implanted Port 01/27/12 Right Chest   01/27/12    0915    Chest   2462   Peripheral IV 10/24/18 Right;Upper Arm   10/24/18    1611    Arm   less than 1   Negative Pressure Wound Therapy Abdomen   11/17/16    1734    -   706   Incision (Closed) 11/17/16 Abdomen   11/17/16    1747     706          Intake/Output Last 24 hours No intake or output data in the 24 hours ending 10/24/18 1738  Labs/Imaging Results for orders placed or performed during the hospital encounter of 10/24/18 (from the past 48 hour(s))  CBC with Differential     Status: Abnormal   Collection Time: 10/24/18  3:46 PM  Result Value Ref Range   WBC 11.2 (H) 4.0 - 10.5 K/uL   RBC 3.13 (L) 3.87 - 5.11 MIL/uL   Hemoglobin 9.7 (L) 12.0 - 15.0 g/dL   HCT 31.4 (L) 36.0 - 46.0 %   MCV 100.3 (H) 80.0 - 100.0 fL   MCH 31.0 26.0 - 34.0 pg   MCHC 30.9 30.0 - 36.0 g/dL   RDW 14.4 11.5 - 15.5 %   Platelets 235 150 - 400 K/uL   nRBC 0.0 0.0 - 0.2 %   Neutrophils Relative % 72 %   Neutro Abs 8.1 (H) 1.7 - 7.7 K/uL   Lymphocytes Relative 19 %   Lymphs Abs 2.1 0.7 - 4.0 K/uL    Monocytes Relative 7 %   Monocytes Absolute 0.8 0.1 - 1.0 K/uL   Eosinophils Relative 1 %   Eosinophils Absolute 0.1 0.0 - 0.5 K/uL   Basophils Relative 0 %   Basophils Absolute 0.0 0.0 - 0.1 K/uL   Immature Granulocytes 1 %   Abs Immature Granulocytes 0.08 (H) 0.00 - 0.07 K/uL    Comment: Performed at [facility], [place] [address] [place], [place] [address]  Basic metabolic panel     Status: Abnormal   Collection Time: 10/24/18  3:46 PM  Result Value Ref Range   Sodium 140 135 - 145 mmol/L  Potassium 3.8 3.5 - 5.1 mmol/L   Chloride 108 98 - 111 mmol/L   CO2 23 22 - 32 mmol/L   Glucose, Bld 135 (H) 70 - 99 mg/dL   BUN 32 (H) 8 - 23 mg/dL   Creatinine, Ser 1.23 (H) 0.44 - 1.00 mg/dL   Calcium 8.8 (L) 8.9 - 10.3 mg/dL   GFR calc non Af Amer 41 (L) >60 mL/min   GFR calc Af Amer 47 (L) >60 mL/min   Anion gap 9 5 - 15    Comment: Performed at [facility], [place] [address], [place], [place] [address]   Dg Chest 1 View  Result Date: 10/24/2018 CLINICAL DATA:  85 year old female with a fall and hip pain EXAM: CHEST  1 VIEW COMPARISON:  11/19/2016 FINDINGS: Cardiomediastinal silhouette unchanged in size and contour. Low lung volumes with linear opacities at the lung bases. No pneumothorax or pleural effusion. No confluent airspace disease. Changes of prior left shoulder arthroplasty. Degenerative changes of the right shoulder. IMPRESSION: Chronic changes without evidence of acute cardiopulmonary disease Electronically Signed   By: Corrie Mckusick D.O.   On: 10/24/2018 16:37   Dg Hip Unilat W Or Wo Pelvis 2-3 Views Right  Result Date: 10/24/2018 CLINICAL DATA:  85 year old who fell 3 days ago and complains of persistent RIGHT hip and RIGHT UPPER leg pain. Initial encounter. EXAM: DG HIP (WITH OR WITHOUT PELVIS) 2-3V RIGHT COMPARISON:  Bone window images from CT abdomen and pelvis 11/15/2016. FINDINGS: Acute comminuted intertrochanteric RIGHT femoral neck  fracture. RIGHT hip joint anatomically aligned with minimal to mild joint space narrowing. Symmetric minimal to mild joint space narrowing in the contralateral LEFT hip. Sacroiliac joints and symphysis pubis anatomically aligned without diastasis and demonstrating degenerative changes. Osseous demineralization. Degenerative changes involving the visualized LOWER lumbar spine. IMPRESSION: Acute comminuted intertrochanteric RIGHT femoral neck fracture. Electronically Signed   By: Evangeline Dakin M.D.   On: 10/24/2018 16:52   Dg Femur Min 2 Views Right  Result Date: 10/24/2018 CLINICAL DATA:  85 year old who fell 3 days ago and complains of persistent RIGHT hip and RIGHT UPPER leg pain. Initial encounter. EXAM: RIGHT FEMUR 2 VIEWS COMPARISON:  None. These images are read in conjunction with the RIGHT hip x-rays obtained concurrently. FINDINGS: The intertrochanteric RIGHT femoral neck fracture is not included on these images. No fractures elsewhere involving the RIGHT femur. Osseous demineralization. Severe tricompartment osteoarthritis involving the knee. IMPRESSION: 1. No fractures elsewhere involving the RIGHT femur. 2. Osseous demineralization. 3. Severe tricompartment osteoarthritis involving the knee. Electronically Signed   By: Evangeline Dakin M.D.   On: 10/24/2018 16:53    Pending Labs [employer] (From admission, onward)    Start     Ordered   Signed and Held  CBC  (enoxaparin (LOVENOX)    CrCl >/= 30 ml/min)  Once,   R    Comments:  Baseline for enoxaparin therapy IF NOT ALREADY DRAWN.  Notify MD if PLT < 100 K.    Signed and Held   Signed and Held  Creatinine, serum  (enoxaparin (LOVENOX)    CrCl >/= 30 ml/min)  Once,   R    Comments:  Baseline for enoxaparin therapy IF NOT ALREADY DRAWN.    Signed and Held   Signed and Held  Creatinine, serum  (enoxaparin (LOVENOX)    CrCl >/= 30 ml/min)  Weekly,   R    Comments:  while on enoxaparin therapy    Signed and Held   Signed  and Held   Magnesium  Once,   R     Signed and Held   Signed and Held  Phosphorus  Once,   R     Signed and Held   Signed and Occupational hygienist morning,   R     Signed and Held   Signed and Held  CBC  Tomorrow morning,   R     Signed and Held   Signed and Held  Troponin I - Now Then Q3H  Now then every 3 hours,   R     Signed and Held          Vitals/Pain Today's Vitals   10/24/18 1543 10/24/18 1703 10/24/18 1703 10/24/18 1711  BP: 137/79   (!) 154/56  Pulse: 96   95  Resp: 18   20  Temp: 97.9 F (36.6 C)     TempSrc: Oral     SpO2: 91%   90%  PainSc:  5  5      Isolation Precautions No active isolations  Medications Medications  acetaminophen (TYLENOL) tablet 1,000 mg (1,000 mg Oral Given 10/24/18 1601)  morphine 2 MG/ML injection 2 mg (2 mg Intravenous Given 10/24/18 1610)  0.9 %  sodium chloride infusion ( Intravenous New Bag/Given 10/24/18 1613)    Mobility walks with device Moderate fall risk               Neuro Assessment:   Neuro Checks:      Last Documented NIHSS Modified Score:   Has TPA been given? No If patient is a Neuro Trauma and patient is going to OR before floor call report to [place] nurse: [phone] or [phone]     R Recommendations: See Admitting Provider Note  Report given to:   Additional Notes:

## 2018-10-24 NOTE — Transfer of Care (Signed)
Immediate Anesthesia Transfer of Care Note  Patient: Brandy [name]  Procedure(s) Performed: INTRAMEDULLARY (IM) NAIL INTERTROCHANTRIC (Right Hip)  Patient Location: PACU  Anesthesia Type:General  Level of Consciousness: awake, alert  and oriented  Airway & Oxygen Therapy: Patient Spontanous Breathing and Patient connected to face mask oxygen  Post-op Assessment: Report given to RN and Post -op Vital signs reviewed and stable  Post vital signs: Reviewed and stable  Last Vitals:  Vitals Value Taken Time  BP 166/82 10/24/2018 10:11 PM  Temp    Pulse 104 10/24/2018 10:13 PM  Resp 18 10/24/2018 10:13 PM  SpO2 99 % 10/24/2018 10:13 PM  Vitals shown include unvalidated device data.  Last Pain:  Vitals:   10/24/18 2014  TempSrc: Oral  PainSc: 5          Complications: No apparent anesthesia complications

## 2018-10-24 NOTE — ED Notes (Signed)
Daughter, [address] [phone]

## 2018-10-24 NOTE — Anesthesia Preprocedure Evaluation (Addendum)
Anesthesia Evaluation  Patient identified by MRN, date of birth, ID band Patient awake    Reviewed: Allergy & Precautions, NPO status , Patient's Chart, lab work & pertinent test results  Airway Mallampati: I  TM Distance: >3 FB Neck ROM: Full    Dental no notable dental hx. (+) Upper Dentures, Lower Dentures   Pulmonary neg pulmonary ROS,    Pulmonary exam normal breath sounds clear to auscultation       Cardiovascular hypertension, Pt. on medications Normal cardiovascular exam+ Valvular Problems/Murmurs AS  Rhythm:Regular Rate:Normal + Systolic murmurs Study Conclusions  - Left ventricle: The cavity size was normal. Wall thickness was   normal. Systolic function was normal. The estimated ejection   fraction was in the range of 60% to 65%. Wall motion was normal;   there were no regional wall motion abnormalities. Doppler   parameters are consistent with abnormal left ventricular   relaxation (grade 1 diastolic dysfunction). Doppler parameters   are consistent with elevated mean left atrial filling pressure. - Aortic valve: Valve mobility was moderately restricted. There was   moderate stenosis. There was trivial regurgitation. Mean gradient   (S): 25 mm Hg. Peak gradient (S): 43 mm Hg. Valve area (VTI):   1.37 cm^2. Valve area (Vmax): 1.39 cm^2. Valve area (Vmean): 1.35   cm^2. - Mitral valve: Moderately to severely calcified annulus. Mildly   thickened leaflets . Mean gradient (D): 3 mm Hg. Peak gradient   (D): 9 mm Hg. Valve area by pressure half-time: 2.34 cm^2. Valve   area by continuity equation (using LVOT flow): 2.64 cm^2. - Left atrium: The atrium was mildly dilated. - Atrial septum: No defect or patent foramen ovale was identified.  Impressions:  - The degree of aortic stenosis appears unchanged compared to the   last study (gradients may have been slightly overestimated on the   previous study due to the  irregular rhythm).    Neuro/Psych PSYCHIATRIC DISORDERS Anxiety Depression negative neurological ROS  negative psych ROS   GI/Hepatic negative GI ROS, Neg liver ROS, hiatal hernia, PUD, GERD  Medicated,  Endo/Other  negative endocrine ROS  Renal/GU Renal InsufficiencyRenal disease  negative genitourinary   Musculoskeletal negative musculoskeletal ROS (+) Arthritis ,   Abdominal   Peds negative pediatric ROS (+)  Hematology negative hematology ROS (+) anemia ,   Anesthesia Other Findings   Reproductive/Obstetrics negative OB ROS                             Anesthesia Physical  Anesthesia Plan  ASA: III  Anesthesia Plan: General   Post-op Pain Management:    Induction: Intravenous, Rapid sequence and Cricoid pressure planned  PONV Risk Score and Plan: 3 and Ondansetron, Dexamethasone and Diphenhydramine  Airway Management Planned: Oral ETT  Additional Equipment:   Intra-op Plan:   Post-operative Plan: Extubation in OR  Informed Consent: I have reviewed the patients History and Physical, chart, labs and discussed the procedure including the risks, benefits and alternatives for the proposed anesthesia with the patient or authorized representative who has indicated his/her understanding and acceptance.     Dental advisory given  Plan Discussed with: CRNA and Anesthesiologist  Anesthesia Plan Comments:        Anesthesia Quick Evaluation

## 2018-10-24 NOTE — Op Note (Signed)
  OPERATIVE REPORT   PREOPERATIVE DIAGNOSIS: Right intertrochanteric femur fracture.   POSTOP DIAGNOSIS: Right intertrochanteric femur fracture.   PROCEDURE: Intramedullary nailing, Right intertrochanteric femur  fracture.   SURGEON: Gaynelle Arabian, M.D.   ASSISTANT: None  ANESTHESIA:General  Estimated BLOOD LOSS: 100 ml  DRAINS: None.   COMPLICATIONS:   None  CONDITION: -PACU - hemodynamically stable.    CLINICAL NOTE: Brandy [name] is an 85 y.o. female, who had a fall 2  days ago sustaining a displaced  Right intertrochanteric femur fracture. They have been cleared medically and present for operative fixation   PROCEDURE IN DETAIL: After successful administration of  General,  the patient was placed on the fracture table with Right lower extremity in a well-padded traction boot,  Left lower extremity in a well-padded leg holder. Under fluoroscopic guidance, the fracture wasreduced. The traction was locked in this position. Thigh was prepped  and draped in the usual sterile fashion. The guide pin for the Biomet  Affixus was then passed percutaneously to the tip of the greater  trochanter, and then entered into the femoral canal. It was passed into the  canal. The small incision was made and the starter reamer passed over  the guide pin. This was then removed. The nail which was an 11  mm  diameter short trochanteric nail with 130 degrees angle was attached to  the external guide and then passed into the femoral canal, impacted to  the appropriate depth in the canal, then we used the external guide to  place the lag screw. Through the external guide, a guide pin was  passed. Small incision made, and the guide pin was in the center of the  femoral head on the AP and slightly center to posterior on the lateral.  Length was 95 mm. Triple reamer was passed over the guide pin. 95 mm  lag screw was placed. It was then locked down with a locking screw.  Through the external guide,  the distal interlock was placed through the  static hole and this was 38 mm in length with excellent bicortical  purchase. The external guide was then removed. Hardware was in good  position and fracture was well reduced. Wound was copiously irrigated with saline  solution, and  closed deep with interrupted 1 Vicryl, subcu  interrupted 2-0 Vicryl, subcuticular running 4-0 Monocryl. Incision was  cleaned and dried and sterile dressings applied. The patient was awakened and  transported to recovery in stable condition.   Brandy Plover Lyssa Hackley, MD    10/24/2018, 9:56 PM

## 2018-10-24 NOTE — ED Notes (Signed)
Bed: TM93 Expected date:  Expected time:  Means of arrival:  Comments: EMS- 82s F,  Knee pain

## 2018-10-25 ENCOUNTER — Encounter (HOSPITAL_COMMUNITY): Payer: Self-pay | Admitting: Orthopedic Surgery

## 2018-10-25 ENCOUNTER — Telehealth: Payer: Self-pay | Admitting: Hematology and Oncology

## 2018-10-25 DIAGNOSIS — F329 Major depressive disorder, single episode, unspecified: Secondary | ICD-10-CM

## 2018-10-25 DIAGNOSIS — R296 Repeated falls: Secondary | ICD-10-CM

## 2018-10-25 LAB — CBC
HCT: 28.4 % — ABNORMAL LOW (ref 36.0–46.0)
Hemoglobin: 8.6 g/dL — ABNORMAL LOW (ref 12.0–15.0)
MCH: 30.5 pg (ref 26.0–34.0)
MCHC: 30.3 g/dL (ref 30.0–36.0)
MCV: 100.7 fL — ABNORMAL HIGH (ref 80.0–100.0)
Platelets: 208 10*3/uL (ref 150–400)
RBC: 2.82 MIL/uL — ABNORMAL LOW (ref 3.87–5.11)
RDW: 14.1 % (ref 11.5–15.5)
WBC: 8.7 10*3/uL (ref 4.0–10.5)
nRBC: 0 % (ref 0.0–0.2)

## 2018-10-25 LAB — BASIC METABOLIC PANEL
Anion gap: 13 (ref 5–15)
BUN: 25 mg/dL — ABNORMAL HIGH (ref 8–23)
CO2: 15 mmol/L — ABNORMAL LOW (ref 22–32)
Calcium: 8.5 mg/dL — ABNORMAL LOW (ref 8.9–10.3)
Chloride: 110 mmol/L (ref 98–111)
Creatinine, Ser: 0.96 mg/dL (ref 0.44–1.00)
GFR calc Af Amer: >60 mL/min (ref >60)
GFR calc non Af Amer: 55 mL/min — ABNORMAL LOW (ref >60)
Glucose, Bld: 172 mg/dL — ABNORMAL HIGH (ref 70–99)
Potassium: 4.3 mmol/L (ref 3.5–5.1)
Sodium: 138 mmol/L (ref 135–145)

## 2018-10-25 LAB — TROPONIN I: Troponin I: <0.03 ng/mL (ref <0.03)

## 2018-10-25 MED ORDER — AMLODIPINE BESYLATE 10 MG PO TABS
10.0000 mg | ORAL_TABLET | Freq: Every day | ORAL | Status: DC
  Administered 2018-10-25 – 2018-10-26 (×2): 10 mg via ORAL
  Filled 2018-10-25 (×2): qty 1

## 2018-10-25 MED ORDER — BOOST PLUS PO LIQD
237.0000 mL | Freq: Three times a day (TID) | ORAL | Status: DC
  Administered 2018-10-25 – 2018-10-26 (×3): 237 mL via ORAL
  Filled 2018-10-25 (×4): qty 237

## 2018-10-25 MED ORDER — HYDROCODONE-ACETAMINOPHEN 5-325 MG PO TABS: 1.0000 | ORAL_TABLET | Freq: Four times a day (QID) | ORAL | 0 refills | Status: DC | PRN

## 2018-10-25 MED ORDER — IRBESARTAN 300 MG PO TABS
300.0000 mg | ORAL_TABLET | Freq: Every day | ORAL | Status: DC
  Administered 2018-10-25 – 2018-10-26 (×2): 300 mg via ORAL
  Filled 2018-10-25 (×2): qty 1

## 2018-10-25 MED ORDER — METHOCARBAMOL 500 MG PO TABS: 500.0000 mg | ORAL_TABLET | Freq: Four times a day (QID) | ORAL | 0 refills | Status: DC | PRN

## 2018-10-25 MED ORDER — ASPIRIN EC 325 MG PO TBEC: 325.0000 mg | DELAYED_RELEASE_TABLET | Freq: Two times a day (BID) | ORAL | 0 refills | Status: DC

## 2018-10-25 NOTE — Progress Notes (Signed)
Initial Nutrition Assessment  INTERVENTION:   Provide Boost Plus chocolate TID- Each supplement provides 360kcal and 14g protein.    NUTRITION DIAGNOSIS:   Increased nutrient needs related to post-op healing as evidenced by estimated needs.  GOAL:   Patient will meet greater than or equal to 90% of their needs  MONITOR:   PO intake, Supplement acceptance, Labs, Weight trends, I & O's  REASON FOR ASSESSMENT:   Malnutrition Screening Tool    ASSESSMENT:   85 year old female past medical history significant for hypertension, hyperlipidemia, moderate aortic stenosis as per echocardiogram done in 2018, nephrolithiasis, GERD, breast cancer, asthma, arthritis and anemia.  Pt admitted for hip fracture. 4/20: s/p Intramedullary nailing of Right intertrochanteric femur fracture  **RD working remotely**  Patient now on regular diet and is consuming 50-100% of meals today. Pt with history of severe malnutrition per chart review. Pt in the past has drank Boost supplements, will order to support healing from surgery.   Per weight records, no recent weights since October 2018.  Labs reviewed. Medications: Ferrous sulfate tablet daily, Zofran tablet daily,  IV Reglan PRN  NUTRITION - FOCUSED PHYSICAL EXAM:  Unable to perform per department requirements to work remotely.   Diet Order:   Diet Order            Diet regular Room service appropriate? Yes; Fluid consistency: Thin  Diet effective now              EDUCATION NEEDS:   No education needs have been identified at this time  Skin:  Skin Assessment: Reviewed RN Assessment  Last BM:  4/19  Height:   Ht Readings from Last 1 Encounters:  10/24/18 5\' 3"  (1.6 m)    Weight:   Wt Readings from Last 1 Encounters:  10/24/18 63.5 kg    Ideal Body Weight:  52.3 kg  BMI:  Body mass index is 24.8 kg/m.  Estimated Nutritional Needs:   Kcal:  1550-1750  Protein:  70-80g  Fluid:  1.7L/day  Clayton Bibles, MS, RD,  LDN [place] Dietitian Pager: [phone] After Hours Pager: [phone]

## 2018-10-25 NOTE — Progress Notes (Signed)
   Subjective: 1 Day Post-Op Procedure(s) (LRB): INTRAMEDULLARY (IM) NAIL INTERTROCHANTRIC (Right) Patient reports pain as mild.   We will start therapy today.  Plan is to go Home after hospital stay.  Objective: Vital signs in last 24 hours: Temp:  [97.9 F (36.6 C)-99.4 F (37.4 C)] 98.2 F (36.8 C) (04/21 0606) Pulse Rate:  [95-107] 95 (04/21 0606) Resp:  [16-24] 16 (04/21 0606) BP: (137-176)/(53-82) 147/63 (04/21 0606) SpO2:  [90 %-100 %] 97 % (04/21 0606) Weight:  [63.5 kg] 63.5 kg (04/20 1825)  Intake/Output from previous day:  Intake/Output Summary (Last 24 hours) at 10/25/2018 0718 Last data filed at 10/25/2018 0600 Gross per 24 hour  Intake 1136.88 ml  Output 50 ml  Net 1086.88 ml    Intake/Output this shift: No intake/output data recorded.  Labs: Recent Labs    10/24/18 1546 10/24/18 1900 10/25/18 0400  HGB 9.7* 10.1* 8.6*   Recent Labs    10/24/18 1900 10/25/18 0400  WBC 12.3* 8.7  RBC 3.25* 2.82*  HCT 32.6* 28.4*  PLT 232 208   Recent Labs    10/24/18 1546 10/24/18 1900 10/25/18 0400  NA 140  --  138  K 3.8  --  4.3  CL 108  --  110  CO2 23  --  15*  BUN 32*  --  25*  CREATININE 1.23* 1.16* 0.96  GLUCOSE 135*  --  172*  CALCIUM 8.8*  --  8.5*   No results for input(s): LABPT, INR in the last 72 hours.  EXAM General - Patient is Alert, Appropriate and Oriented Extremity - Neurovascular intact Dorsiflexion/Plantar flexion intact Incision: dressing C/D/I Dressing - dressing C/D/I Motor Function - intact, moving foot and toes well on exam.    Past Medical History:  Diagnosis Date  . Anemia   . Arthritis    shoulders  . Asthma    mild, no inhalers used  . Breast cancer ([place]) 12/18/11   left breast masectomy=metastatic ca in (1/1) lymph node ,invasive ductal ca,2 foci,,dcis,lymph ovascular invasion identified,surgical resection margins neg for ca,additional tissue=benign skin and subcutaneous tissue  . Bronchitis    hx of;last  time >48yr ago  . Depression    takes Celexa daily  . Difficult intubation   . Dysphagia   . Full dentures   . Gall stones 2015  . GERD (gastroesophageal reflux disease)    takes Prilosec prn  . H/O hiatal hernia   . History of kidney stones    many yrs ago  . Hx of radiation therapy 04/26/12 -06/10/12   left breast  . Hyperlipidemia    takes Simvastatin daily  . Hypertension    takes Amlodipine and Diovan daily  . Insomnia    takes Ambien nightly  . Urinary urgency     Assessment/Plan: 1 Day Post-Op Procedure(s) (LRB): INTRAMEDULLARY (IM) NAIL INTERTROCHANTRIC (Right) Active Problems:   S/P right hip fracture   Advance diet Up with therapy D/C IV fluids  DVT Prophylaxis - Lovenox Weight Bearing As Tolerated right Leg Begin Therapy  Brandy [name]

## 2018-10-25 NOTE — Plan of Care (Signed)
Plan of care reviewed and discussed with the patient. 

## 2018-10-25 NOTE — Discharge Instructions (Addendum)
Dr. Gaynelle Arabian Total Joint Specialist [facility] [address], [place] [address] [phone]  HIP FRACTURE POST-OPERATIVE DISCHARGE INSTRUCTIONS   Hip Rehabilitation, Guidelines Following Surgery   BLOOD CLOT PREVENTION  Take one 325 mg Aspirin two times a day for three weeks following surgery. Then take one 81 mg aspirin once a day for three weeks. Then discontinue aspirin.   HOME CARE INSTRUCTIONS   Remove items at home which could result in a fall. This includes throw rugs or furniture in walking pathways.   ICE to the affected hip every three hours for 30 minutes at a time and then as needed for pain and swelling.  Continue to use ice on the hip for pain and swelling from surgery. You may notice swelling that will progress down to the foot and ankle.  This is normal after surgery.  Elevate the leg when you are not up walking on it.    Continue to use the breathing machine which will help keep your temperature down.  It is common for your temperature to cycle up and down following surgery, especially at night when you are not up moving around and exerting yourself.  The breathing machine keeps your lungs expanded and your temperature down.  DRESSING / WOUND CARE / SHOWERING You may shower 3 days after surgery, but keep the wounds dry during showering.  You may use an occlusive plastic wrap (Press'n Seal for example), NO SOAKING/SUBMERGING IN THE BATHTUB.  If the bandage gets wet, change with a clean dry gauze.  If the incision gets wet, pat the wound dry with a clean towel. You may start showering once you are discharged home but do not submerge the incision under water. Just pat the incision dry and apply a dry gauze dressing on daily. Change the surgical dressing daily and reapply a dry dressing each time.  ACTIVITY Walk with your walker as instructed. Use walker as long as suggested by your caregivers. Avoid periods of inactivity such as sitting  longer than an hour when not asleep. This helps prevent blood clots.  Do not drive a car for 6 weeks or until released by you surgeon.  Do not drive while taking narcotics.  WEIGHT BEARING Weight bearing as tolerated with assist device (walker, cane, etc) as directed, use it as long as suggested by your surgeon or therapist, typically at least 4-6 weeks.  MEDICATIONS See your medication summary on the After Visit Summary that the nursing staff will review with you prior to discharge.  You may have some home medications which will be placed on hold until you complete the course of blood thinner medication.  It is important for you to complete the blood thinner medication as prescribed by your surgeon.  Continue your approved medications as instructed at time of discharge.                                                 FOLLOW-UP APPOINTMENTS Make sure you keep all of your appointments after your operation with your surgeon and caregivers. You should call the office at the above phone number and make an appointment for approximately two weeks after the date of your surgery or on the date instructed by your surgeon outlined in the "After Visit Summary".  IF YOU ARE TRANSFERRED TO A SKILLED REHAB FACILITY If the  patient is transferred to a skilled rehab facility following release from the hospital, a list of the current medications will be sent to the facility for the patient to continue.  When discharged from the skilled rehab facility, please have the facility set up the patient's [place] prior to being released. Also, the skilled facility will be responsible for providing the patient with their medications at time of release from the facility to include their pain medication, the muscle relaxants, and their blood thinner medication. If the patient is still at the rehab facility at time of the two week follow up appointment, the skilled rehab facility will also need to assist the  patient in arranging follow up appointment in our office and any transportation needs.  MAKE SURE YOU:   Understand these instructions.   Get help right away if you are not doing well or get worse.   Do not submerge incision under water. Please use good hand washing techniques while changing dressing each day. May shower starting three days after surgery. Please use a clean towel to pat the incision dry following showers. Continue to use ice for pain and swelling after surgery. Do not use any lotions or creams on the incision until instructed by your surgeon.

## 2018-10-25 NOTE — Progress Notes (Signed)
PROGRESS NOTE    Brandy [name]  CHE:[id number] DOB: 09/01/1932 DOA: 10/24/2018 PCP: Kathyrn Lass, MD   Brief Narrative:  HPI on 10/24/2018 by Dr. Dana Allan 85 year old female past medical history significant for hypertension, hyperlipidemia, moderate aortic stenosis as per echocardiogram done in 2018, nephrolithiasis, GERD, breast cancer, asthma, arthritis and anemia.  Patient normally walks around with a walker.  Patient reports falling every 2 to 3 months.  Patient tripped and fell 3 days ago was backing out of the bathroom.  Following the fall, patient developed right hip pain but did not come to the hospital or see any provider.  Earlier today, patient heard a "pop", with associated worsening right hip pain and decided to come to the hospital.  No chest pain, no shortness of breath, no limitation of mobility prior to falls, no dyspnea on exertion, no leg edema, no headache, no neck pain, no fever or chills, no URI symptoms, no GI symptoms or urinary symptoms.  The ER provider has already discussed with the orthopedic doctor, Dr. Wynelle Link.  The plan is to proceed with surgery as soon as possible.  Assessment & Plan   Right hip fracture -Secondary to fall -Orthopedic surgery consulted and appreciated, status post right intramedullary nare intertrochanteric -Pending PT OT -Continue pain control  Aortic stenosis -Noted to be mild to moderate on echocardiogram done in 2018 -Patient currently asymptomatic -Follow-up with her cardiologist  Essential hypertension -Continue valsartan HCTZ, amlodipine, spironolactone  Depression -Continue Celexa  Chronic anemia -Hemoglobin appears to be baseline, monitor CBC  DVT Prophylaxis  lovenox  Code Status: Full  Family Communication: None at bedside  Disposition Plan: Admitted.  Dispo pending PT and OT evaluations.  Consultants Orthopedic surgery  Procedures  INTRAMEDULLARY (IM) NAIL INTERTROCHANTRIC (Right)  Antibiotics     Anti-infectives (From admission, onward)   Start     Dose/Rate Route Frequency Ordered Stop   10/25/18 0300  ceFAZolin (ANCEF) IVPB 1 g/50 mL premix     1 g 100 mL/hr over 30 Minutes Intravenous Every 6 hours 10/24/18 2252 10/25/18 0900   10/24/18 2032  ceFAZolin (ANCEF) 2-4 GM/100ML-% IVPB    Note to Pharmacy:  Maudry Diego  : cabinet override      10/24/18 2032 10/25/18 0844   10/24/18 1945  ceFAZolin (ANCEF) IVPB 2g/100 mL premix  Status:  Discontinued     2 g 200 mL/hr over 30 Minutes Intravenous  Once 10/24/18 1936 10/24/18 2252      Subjective:   [name] seen and examined today.  Patient with no complaints this morning.  States her pain is very mild although not sure about getting up to work with physical therapy.  Denies current chest pain, shortness breath, abdominal pain, nausea vomiting, diarrhea constipation, dizziness or headache.  States that she still lives with her husband who is also elderly.  Objective:   Vitals:   10/25/18 0147 10/25/18 0255 10/25/18 0606 10/25/18 1011  BP: (!) 147/64 (!) 143/79 (!) 147/63 140/60  Pulse: 97 (!) 102 95 97  Resp: 18 20 16 18   Temp: 99 F (37.2 C) 98.8 F (37.1 C) 98.2 F (36.8 C) 99.5 F (37.5 C)  TempSrc: Oral Oral Oral Oral  SpO2: 96% 95% 97% 98%  Weight:      Height:        Intake/Output Summary (Last 24 hours) at 10/25/2018 1238 Last data filed at 10/25/2018 0930 Gross per 24 hour  Intake 1610.36 ml  Output 50 ml  Net 1560.36 ml  Filed Weights   10/24/18 1825  Weight: 63.5 kg    Exam  General: Well developed, well nourished, NAD, appears stated age  71: NCAT, mucous membranes moist.   Neck: Supple  Cardiovascular: S1 S2 auscultated, RRR,3/6 SEM  Respiratory: Clear to auscultation bilaterally   Abdomen: Soft, nontender, nondistended, + bowel sounds  Extremities: warm dry without cyanosis clubbing or edema  Neuro: AAOx3, nonfocal  Psych: Pleasant, appropriate mood and affect   Data  Reviewed: I have personally reviewed following labs and imaging studies  CBC: Recent Labs  Lab 10/24/18 1546 10/24/18 1900 10/25/18 0400  WBC 11.2* 12.3* 8.7  NEUTROABS 8.1*  --   --   HGB 9.7* 10.1* 8.6*  HCT 31.4* 32.6* 28.4*  MCV 100.3* 100.3* 100.7*  PLT 235 232 902   Basic Metabolic Panel: Recent Labs  Lab 10/24/18 1546 10/24/18 1900 10/25/18 0400  NA 140  --  138  K 3.8  --  4.3  CL 108  --  110  CO2 23  --  15*  GLUCOSE 135*  --  172*  BUN 32*  --  25*  CREATININE 1.23* 1.16* 0.96  CALCIUM 8.8*  --  8.5*  MG  --  2.0  --   PHOS  --  4.1  --    GFR: Estimated Creatinine Clearance: 39.8 mL/min (by C-G formula based on SCr of 0.96 mg/dL). Liver Function Tests: No results for input(s): AST, ALT, ALKPHOS, BILITOT, PROT, ALBUMIN in the last 168 hours. No results for input(s): LIPASE, AMYLASE in the last 168 hours. No results for input(s): AMMONIA in the last 168 hours. Coagulation Profile: No results for input(s): INR, PROTIME in the last 168 hours. Cardiac Enzymes: Recent Labs  Lab 10/24/18 1900 10/24/18 2348  TROPONINI <0.03 <0.03   BNP (last 3 results) No results for input(s): PROBNP in the last 8760 hours. HbA1C: No results for input(s): HGBA1C in the last 72 hours. CBG: No results for input(s): GLUCAP in the last 168 hours. Lipid Profile: No results for input(s): CHOL, HDL, LDLCALC, TRIG, CHOLHDL, LDLDIRECT in the last 72 hours. Thyroid Function Tests: No results for input(s): TSH, T4TOTAL, FREET4, T3FREE, THYROIDAB in the last 72 hours. Anemia Panel: No results for input(s): VITAMINB12, FOLATE, FERRITIN, TIBC, IRON, RETICCTPCT in the last 72 hours. Urine analysis:    Component Value Date/Time   COLORURINE AMBER (A) 11/15/2016 2253   APPEARANCEUR CLOUDY (A) 11/15/2016 2253   LABSPEC 1.017 11/15/2016 2253   PHURINE 5.0 11/15/2016 2253   GLUCOSEU NEGATIVE 11/15/2016 2253   HGBUR NEGATIVE 11/15/2016 2253   BILIRUBINUR SMALL (A) 11/15/2016 2253    KETONESUR NEGATIVE 11/15/2016 2253   PROTEINUR NEGATIVE 11/15/2016 2253   UROBILINOGEN 1.0 10/10/2009 1100   NITRITE NEGATIVE 11/15/2016 2253   LEUKOCYTESUR NEGATIVE 11/15/2016 2253   Sepsis Labs: @LABRCNTIP (procalcitonin:4,lacticidven:4)  ) Recent Results (from the past 240 hour(s))  Surgical PCR screen     Status: None   Collection Time: 10/24/18  7:34 PM  Result Value Ref Range Status   MRSA, PCR NEGATIVE NEGATIVE Final   Staphylococcus aureus NEGATIVE NEGATIVE Final    Comment: (NOTE) The Xpert SA Assay (FDA approved for NASAL specimens in patients 17 years of age and older), is one component of a comprehensive surveillance program. It is not intended to diagnose infection nor to guide or monitor treatment. Performed at [facility], [place] [address] [place], [place] [address]       Radiology Studies: Dg Chest 1 View  Result Date: 10/24/2018 CLINICAL DATA:  85 year old female with a fall and hip pain EXAM: CHEST  1 VIEW COMPARISON:  11/19/2016 FINDINGS: Cardiomediastinal silhouette unchanged in size and contour. Low lung volumes with linear opacities at the lung bases. No pneumothorax or pleural effusion. No confluent airspace disease. Changes of prior left shoulder arthroplasty. Degenerative changes of the right shoulder. IMPRESSION: Chronic changes without evidence of acute cardiopulmonary disease Electronically Signed   By: Corrie Mckusick D.O.   On: 10/24/2018 16:37   Ct Head Wo Contrast  Result Date: 10/24/2018 CLINICAL DATA:  Fall 3-4 days ago.  Hit right-side of head. EXAM: CT HEAD WITHOUT CONTRAST TECHNIQUE: Contiguous axial images were obtained from the base of the skull through the vertex without intravenous contrast. COMPARISON:  CT head dated August 21, 2015. FINDINGS: Brain: No evidence of acute infarction, hemorrhage, hydrocephalus, extra-axial collection or mass lesion/mass effect. Stable moderate atrophy and chronic microvascular ischemic  changes. Vascular: Calcified atherosclerosis at the skullbase. No hyperdense vessel. Skull: Negative for fracture or focal lesion. Old left zygomatic arch fracture. Sinuses/Orbits: No acute finding. Other: None. IMPRESSION: 1. No acute intracranial abnormality. Stable moderate atrophy and chronic microvascular ischemic changes. Electronically Signed   By: Titus Dubin M.D.   On: 10/24/2018 17:49   Dg C-arm 1-60 Min-no Report  Result Date: 10/24/2018 Fluoroscopy was utilized by the requesting physician.  No radiographic interpretation.   Dg Hip Operative Unilat W Or W/o Pelvis Right  Result Date: 10/24/2018 CLINICAL DATA:  Right IM nail. EXAM: OPERATIVE RIGHT HIP (WITH PELVIS IF PERFORMED) 5 VIEWS TECHNIQUE: Fluoroscopic spot image(s) were submitted for interpretation post-operatively. COMPARISON:  10/24/2018 FINDINGS: Multiple intraoperative spot images demonstrate internal fixation with IM nail across the right femoral neck fracture. Anatomic alignment. No hardware complicating feature. IMPRESSION: Internal fixation.  No complicating feature. Electronically Signed   By: Rolm Baptise M.D.   On: 10/24/2018 22:54   Dg Hip Unilat W Or Wo Pelvis 2-3 Views Right  Result Date: 10/24/2018 CLINICAL DATA:  85 year old who fell 3 days ago and complains of persistent RIGHT hip and RIGHT UPPER leg pain. Initial encounter. EXAM: DG HIP (WITH OR WITHOUT PELVIS) 2-3V RIGHT COMPARISON:  Bone window images from CT abdomen and pelvis 11/15/2016. FINDINGS: Acute comminuted intertrochanteric RIGHT femoral neck fracture. RIGHT hip joint anatomically aligned with minimal to mild joint space narrowing. Symmetric minimal to mild joint space narrowing in the contralateral LEFT hip. Sacroiliac joints and symphysis pubis anatomically aligned without diastasis and demonstrating degenerative changes. Osseous demineralization. Degenerative changes involving the visualized LOWER lumbar spine. IMPRESSION: Acute comminuted  intertrochanteric RIGHT femoral neck fracture. Electronically Signed   By: Evangeline Dakin M.D.   On: 10/24/2018 16:52   Dg Femur Min 2 Views Right  Result Date: 10/24/2018 CLINICAL DATA:  85 year old who fell 3 days ago and complains of persistent RIGHT hip and RIGHT UPPER leg pain. Initial encounter. EXAM: RIGHT FEMUR 2 VIEWS COMPARISON:  None. These images are read in conjunction with the RIGHT hip x-rays obtained concurrently. FINDINGS: The intertrochanteric RIGHT femoral neck fracture is not included on these images. No fractures elsewhere involving the RIGHT femur. Osseous demineralization. Severe tricompartment osteoarthritis involving the knee. IMPRESSION: 1. No fractures elsewhere involving the RIGHT femur. 2. Osseous demineralization. 3. Severe tricompartment osteoarthritis involving the knee. Electronically Signed   By: Evangeline Dakin M.D.   On: 10/24/2018 16:53     Scheduled Meds:  amLODipine  10 mg Oral Daily   atorvastatin  40 mg Oral Daily  citalopram  40 mg Oral Daily   docusate sodium  100 mg Oral BID   enoxaparin (LOVENOX) injection  40 mg Subcutaneous Daily   ferrous sulfate  325 mg Oral Q breakfast   gabapentin  300 mg Oral QHS   hydrochlorothiazide  25 mg Oral Daily   irbesartan  300 mg Oral Daily   ondansetron  4 mg Oral Daily   pantoprazole  80 mg Oral Daily   spironolactone  12.5 mg Oral Daily   Continuous Infusions:  sodium chloride 75 mL/hr at 10/25/18 0600   methocarbamol (ROBAXIN) IV       LOS: 1 day   Time Spent in minutes   30 minutes  Natausha Jungwirth D.O. on 10/25/2018 at 12:38 PM  Between 7am to 7pm - Please see pager noted on [email or web address]  After 7pm go to [email or web address]  And look for the night coverage person covering for me after hours  [facility] Office  [phone]

## 2018-10-25 NOTE — Telephone Encounter (Signed)
Pt currently in the hospital - scheduled appt for 3 month follow up and sent letter in the mail.

## 2018-10-25 NOTE — Evaluation (Signed)
Occupational Therapy Evaluation Patient Details Name: AMRA SHUKLA MRN: [id number] DOB: 09-04-1932 Today's Date: 10/25/2018    History of Present Illness 85 yo female admitted to ED on 4/20 with s/p fall on 4/17. Pt reports hitting her head. Pt s/p R intertrochanteric IM nail on 4/20. PMH includes anemia, OA, asthma, breast cancer, depression, GERD, HLD, HTN, CHF, HCAP, ex lap 2018, L shoulder replacement.     Clinical Impression   Pt admitted with fall and hip fx. Pt currently with functional limitations due to the deficits listed below (see OT Problem List).  Pt will benefit from skilled OT to increase their safety and independence with ADL and functional mobility for ADL to facilitate discharge to venue listed below.      Follow Up Recommendations  Home health OT;Supervision/Assistance - 24 hour;SNF(depending on family A)    Equipment Recommendations  None recommended by OT    Recommendations for Other Services       Precautions / Restrictions Precautions Precautions: Fall Restrictions Weight Bearing Restrictions: No Other Position/Activity Restrictions: WBAT       Mobility Bed Mobility Overal bed mobility: Needs Assistance Bed Mobility: Sit to Supine     Supine to sit: HOB elevated;Mod assist;+2 for physical assistance;+2 for safety/equipment Sit to supine: +2 for safety/equipment;Mod assist   General bed mobility comments: Mod assist for RLE lifting and translation to EOB, trunk elevation, and scooting to EOB. Pt nervous with scooting to EOB, stating to give her a minute.  Transfers Overall transfer level: Needs assistance Equipment used: Rolling walker (2 wheeled) Transfers: Stand Pivot Transfers;Squat Pivot Transfers   Stand pivot transfers: Mod assist;From elevated surface;+2 safety/equipment Squat pivot transfers: Mod assist;+2 physical assistance;+2 safety/equipment     General transfer comment: Mod assist for transfer to [facility] for power up, steadying.  HHA+2 for transfer to [facility], RW use of transfer from [facility] to recliner. Pt urinated on floor during transfer, and when sitting on BSC, pt shaking from anxiety. Pt encouraged to breathe and relax.    Balance Overall balance assessment: Needs assistance;History of Falls Sitting-balance support: Feet supported;Single extremity supported Sitting balance-Leahy Scale: Fair     Standing balance support: Bilateral upper extremity supported;During functional activity Standing balance-Leahy Scale: Poor Standing balance comment: reliant on RW/PT and PT aide for standing balance                           ADL either performed or assessed with clinical judgement   ADL Overall ADL's : Needs assistance/impaired Eating/Feeding: Set up;Sitting   Grooming: Set up;Sitting   Upper Body Bathing: Set up;Sitting   Lower Body Bathing: Maximal assistance;Sit to/from stand;Cueing for sequencing;Cueing for safety;+2 for safety/equipment   Upper Body Dressing : Set up;Sitting   Lower Body Dressing: Maximal assistance;Sit to/from stand;Cueing for sequencing;Cueing for safety;+2 for safety/equipment   Toilet Transfer: Maximal assistance;Cueing for safety;Cueing for sequencing;Stand-pivot;BSC   Toileting- Clothing Manipulation and Hygiene: Maximal assistance;+2 for safety/equipment;+2 for physical assistance;Cueing for sequencing;Cueing for safety         General ADL Comments: pt with less anxiety this session than PT session.  Pt said she knew what to expect     Vision Patient Visual Report: No change from baseline       Perception     Praxis      Pertinent Vitals/Pain Pain Assessment: 0-10 Pain Score: 3  Pain Location: R hip Pain Descriptors / Indicators: Sore;Discomfort Pain Intervention(s): Limited activity within patient's tolerance;Monitored during  session;Repositioned     Hand Dominance Right   Extremity/Trunk Assessment Upper Extremity Assessment Upper Extremity Assessment:  Generalized weakness   Lower Extremity Assessment Lower Extremity Assessment: Generalized weakness;RLE deficits/detail RLE Deficits / Details: able to perform quad set, guarded heel slide, ankle pumps   Cervical / Trunk Assessment Cervical / Trunk Assessment: Normal   Communication Communication Communication: HOH   Cognition Arousal/Alertness: Awake/alert Behavior During Therapy: WFL for tasks assessed/performed Overall Cognitive Status: Within Functional Limits for tasks assessed                                              Home Living Family/patient expects to be discharged to:: Private residence Living Arrangements: Spouse/significant other Available Help at Discharge: Family;Available 24 hours/day(lives with husband, daughter lives closeby and assists as needed) Type of Home: House Home Access: Level entry     Home Layout: One level     Bathroom Shower/Tub: Occupational psychologist: Handicapped height     Home Equipment: Environmental consultant - 2 wheels;Cane - single point;Shower seat          Prior Functioning/Environment Level of Independence: Independent with assistive device(s)        Comments: Pt reports intermittently using cane/RW, mostly uses cane. Pt reports independence in ADLs, but occasionally husband helps with household chores.        OT Problem List: Decreased strength;Impaired balance (sitting and/or standing);Decreased safety awareness;Decreased activity tolerance;Decreased knowledge of precautions;Decreased knowledge of use of DME or AE      OT Treatment/Interventions: Self-care/ADL training;Patient/family education;Therapeutic activities;DME and/or AE instruction    OT Goals(Current goals can be found in the care plan section) Acute Rehab OT Goals Patient Stated Goal: get well OT Goal Formulation: With patient Time For Goal Achievement: 11/01/18 Potential to Achieve Goals: Good  OT Frequency: Min 2X/week   Barriers to  D/C:            Co-evaluation              AM-PAC OT "6 Clicks" Daily Activity     Outcome Measure Help from another person eating meals?: None Help from another person taking care of personal grooming?: None Help from another person toileting, which includes using toliet, bedpan, or urinal?: A Lot Help from another person bathing (including washing, rinsing, drying)?: A Lot Help from another person to put on and taking off regular upper body clothing?: A Little   6 Click Score: 15   End of Session Equipment Utilized During Treatment: Rolling walker;Gait belt Nurse Communication: Mobility status  Activity Tolerance: Patient tolerated treatment well Patient left: in bed;with call bell/phone within reach;with nursing/sitter in room;with bed alarm set  OT Visit Diagnosis: Unsteadiness on feet (R26.81);Muscle weakness (generalized) (M62.81);History of falling (Z91.81);Repeated falls (R29.6)                Time: 3790-2409 OT Time Calculation (min): 12 min Charges:  OT General Charges $OT Visit: 1 Visit OT Evaluation $OT Eval Moderate Complexity: 1 Mod  Kari Baars, [place] [phone] Office- [phone]     Hershell Brandl, Edwena Felty D 10/25/2018, 2:22 PM

## 2018-10-25 NOTE — Evaluation (Signed)
Physical Therapy Evaluation Patient Details Name: Brandy [name] MRN: [id number] DOB: Jun 14, 1933 Today's Date: 10/25/2018   History of Present Illness  85 yo female admitted to ED on 4/20 with s/p fall on 4/17. Pt reports hitting her head. Pt s/p R intertrochanteric IM nail on 4/20. PMH includes anemia, OA, asthma, breast cancer, depression, GERD, HLD, HTN, CHF, HCAP, ex lap 2018, L shoulder replacement.    Clinical Impression   Pt presents with RLE pain, difficulty performing bed mobility/transfers, anxiety with mobility, and decreased activity tolerance due to RLE pain, LE weakness, and anxiety. Pt to benefit from acute PT to address deficits. Pt performed stand pivot with mod assist +2, pt unsteady and unwilling to attempt ambulation this session. PT recommending SNF placement based on pt presentation and deficits, possibly could progress to home with HHPT. Pt educated to perform ankle pumps (20x/hour) to promote circulation post-surgery. PT to progress mobility as tolerated, and will continue to follow acutely.      Follow Up Recommendations SNF;Supervision/Assistance - 24 hour(vs HHPT pending pt progress)    Equipment Recommendations  None recommended by PT    Recommendations for Other Services       Precautions / Restrictions Precautions Precautions: Fall Restrictions Weight Bearing Restrictions: No Other Position/Activity Restrictions: WBAT       Mobility  Bed Mobility Overal bed mobility: Needs Assistance Bed Mobility: Supine to Sit     Supine to sit: HOB elevated;Mod assist;+2 for physical assistance;+2 for safety/equipment     General bed mobility comments: Mod assist for RLE lifting and translation to EOB, trunk elevation, and scooting to EOB. Pt nervous with scooting to EOB, stating to give her a minute.  Transfers Overall transfer level: Needs assistance Equipment used: Rolling walker (2 wheeled);2 person hand held assist Transfers: Stand Pivot Transfers    Stand pivot transfers: Mod assist;From elevated surface;+2 safety/equipment       General transfer comment: Mod assist for transfer to [facility] for power up, steadying. HHA+2 for transfer to [facility], RW use of transfer from [facility] to recliner. Pt urinated on floor during transfer, and when sitting on BSC, pt shaking from anxiety. Pt encouraged to breathe and relax.  Ambulation/Gait Ambulation/Gait assistance: (NT - pt trembling and when standing second time, pt states "I am falling" and deferred ambulation this session)              Stairs            Wheelchair Mobility    Modified Rankin (Stroke Patients Only)       Balance Overall balance assessment: Needs assistance;History of Falls Sitting-balance support: Feet supported;Single extremity supported Sitting balance-Leahy Scale: Fair     Standing balance support: Bilateral upper extremity supported;During functional activity Standing balance-Leahy Scale: Poor Standing balance comment: reliant on RW/PT and PT aide for standing balance                             Pertinent Vitals/Pain Pain Assessment: 0-10 Pain Score: 5  Pain Location: R hip Pain Descriptors / Indicators: Sore;Discomfort Pain Intervention(s): Monitored during session;Repositioned;Limited activity within patient's tolerance    Home Living Family/patient expects to be discharged to:: Private residence Living Arrangements: Spouse/significant other Available Help at Discharge: Family;Available 24 hours/day(lives with husband, daughter lives closeby and assists as needed) Type of Home: House Home Access: Level entry     Home Layout: One level Home Equipment: Environmental consultant - 2 wheels;Cane - single point;Shower seat  Prior Function Level of Independence: Independent with assistive device(s)         Comments: Pt reports intermittently using cane/RW, mostly uses cane. Pt reports independence in ADLs, but occasionally husband helps with household  chores.     Hand Dominance   Dominant Hand: Right    Extremity/Trunk Assessment   Upper Extremity Assessment Upper Extremity Assessment: Generalized weakness    Lower Extremity Assessment Lower Extremity Assessment: Generalized weakness;RLE deficits/detail RLE Deficits / Details: able to perform quad set, guarded heel slide, ankle pumps    Cervical / Trunk Assessment Cervical / Trunk Assessment: Normal  Communication   Communication: HOH  Cognition Arousal/Alertness: Awake/alert Behavior During Therapy: Anxious Overall Cognitive Status: Within Functional Limits for tasks assessed                                 General Comments: Pt very anxious with any mobility tasks, at one point shaking from anxiety. PT encouraged pt to take deep breaths and relax as able.       General Comments General comments (skin integrity, edema, etc.): Pt reporting dizziness and nausea while sitting on BSC, improved when transferred to recliner.     Exercises     Assessment/Plan    PT Assessment Patient needs continued PT services  PT Problem List Decreased strength;Decreased mobility;Decreased safety awareness;Decreased range of motion;Decreased activity tolerance;Decreased balance;Decreased knowledge of use of DME;Pain       PT Treatment Interventions DME instruction;Functional mobility training;Balance training;Patient/family education;Therapeutic activities;Neuromuscular re-education;Therapeutic exercise;Gait training    PT Goals (Current goals can be found in the Care Plan section)  Acute Rehab PT Goals Patient Stated Goal: none stated  PT Goal Formulation: With patient Time For Goal Achievement: 11/08/18 Potential to Achieve Goals: Good    Frequency Min 3X/week   Barriers to discharge        Co-evaluation               AM-PAC PT "6 Clicks" Mobility  Outcome Measure Help needed turning from your back to your side while in a flat bed without using  bedrails?: A Little Help needed moving from lying on your back to sitting on the side of a flat bed without using bedrails?: A Lot Help needed moving to and from a bed to a chair (including a wheelchair)?: A Lot Help needed standing up from a chair using your arms (e.g., wheelchair or bedside chair)?: A Lot Help needed to walk in hospital room?: A Lot Help needed climbing 3-5 steps with a railing? : Total 6 Click Score: 12    End of Session Equipment Utilized During Treatment: Gait belt Activity Tolerance: Patient limited by pain;Other (comment)(Pt limited by anxiety) Patient left: in chair;with chair alarm set;with family/visitor present;with call bell/phone within reach Nurse Communication: Mobility status PT Visit Diagnosis: Unsteadiness on feet (R26.81);History of falling (Z91.81);Muscle weakness (generalized) (M62.81);Difficulty in walking, not elsewhere classified (R26.2)    Time: 3244-0102 PT Time Calculation (min) (ACUTE ONLY): 25 min   Charges:   PT Evaluation $PT Eval Low Complexity: 1 Low PT Treatments $Therapeutic Activity: 8-22 mins      Julien Girt, PT Acute Rehabilitation Services Pager [phone]  Office [phone]   Jouri Threat D Elonda Husky 10/25/2018, 12:52 PM

## 2018-10-26 DIAGNOSIS — C50412 Malignant neoplasm of upper-outer quadrant of left female breast: Secondary | ICD-10-CM

## 2018-10-26 DIAGNOSIS — S72141A Displaced intertrochanteric fracture of right femur, initial encounter for closed fracture: Principal | ICD-10-CM

## 2018-10-26 DIAGNOSIS — S72001A Fracture of unspecified part of neck of right femur, initial encounter for closed fracture: Secondary | ICD-10-CM

## 2018-10-26 LAB — BASIC METABOLIC PANEL
Anion gap: 8 (ref 5–15)
BUN: 20 mg/dL (ref 8–23)
CO2: 24 mmol/L (ref 22–32)
Calcium: 9 mg/dL (ref 8.9–10.3)
Chloride: 108 mmol/L (ref 98–111)
Creatinine, Ser: 0.8 mg/dL (ref 0.44–1.00)
GFR calc Af Amer: >60 mL/min (ref >60)
GFR calc non Af Amer: >60 mL/min (ref >60)
Glucose, Bld: 109 mg/dL — ABNORMAL HIGH (ref 70–99)
Potassium: 3.6 mmol/L (ref 3.5–5.1)
Sodium: 140 mmol/L (ref 135–145)

## 2018-10-26 LAB — CBC
HCT: 30.6 % — ABNORMAL LOW (ref 36.0–46.0)
Hemoglobin: 9.4 g/dL — ABNORMAL LOW (ref 12.0–15.0)
MCH: 30.8 pg (ref 26.0–34.0)
MCHC: 30.7 g/dL (ref 30.0–36.0)
MCV: 100.3 fL — ABNORMAL HIGH (ref 80.0–100.0)
Platelets: 241 10*3/uL (ref 150–400)
RBC: 3.05 MIL/uL — ABNORMAL LOW (ref 3.87–5.11)
RDW: 14.2 % (ref 11.5–15.5)
WBC: 9.2 10*3/uL (ref 4.0–10.5)
nRBC: 0 % (ref 0.0–0.2)

## 2018-10-26 MED ORDER — GABAPENTIN 300 MG PO CAPS: 300.0000 mg | ORAL_CAPSULE | Freq: Every day | ORAL | 3 refills | Status: DC

## 2018-10-26 MED ORDER — DOCUSATE SODIUM 100 MG PO CAPS: 100.0000 mg | ORAL_CAPSULE | Freq: Every day | ORAL | 1 refills | Status: DC

## 2018-10-26 NOTE — Progress Notes (Signed)
Physical Therapy Treatment Patient Details Name: Brandy [name] MRN: [id number] DOB: 02-09-1933 Today's Date: 10/26/2018    History of Present Illness 85 yo female admitted to ED on 4/20 with s/p fall on 4/17. Pt reports hitting her head. Pt s/p R intertrochanteric IM nail on 4/20. PMH includes anemia, OA, asthma, breast cancer, depression, GERD, HLD, HTN, CHF, HCAP, ex lap 2018, L shoulder replacement.      PT Comments    POD # 2 Pt pleasant and alert.  Assisted OOB to [facility] pt did well.  VC's for safety and equipment set up.  Assisted with amb in hallway.  General transfer comment: assisted from elevated bed to [facility] then to amb with Min Assist and 50% VC's on proper hand placement and safety with turns.  General Gait Details: amb a functional distance with walker and 50% VC's on proper walker to self distance and upright posture.   Pt educated on IM Nail surgery and WBAT.  Then returned to room to perform some TE's following HEP handout.  Instructed on proper tech, freq as well as use of ICE.   Addressed all mobility questions, discussed appropriate activity, educated on use of ICE.  Pt ready for D/C to home. Spoke with [name] on phone about pt's progress and assist level needed at home.  Pt lives home alone and plans to stay with Son and daughter in [place].    Follow Up Recommendations  Supervision/Assistance - 24 hour     Equipment Recommendations  Rolling walker with 5" wheels(YOUTH)    Recommendations for Other Services       Precautions / Restrictions Precautions Precautions: Fall Restrictions Weight Bearing Restrictions: No Other Position/Activity Restrictions: WBAT     Mobility  Bed Mobility Overal bed mobility: Needs Assistance Bed Mobility: Supine to Sit     Supine to sit: Min assist     General bed mobility comments: assist R LE "guide" with increased time  Transfers Overall transfer level: Needs assistance Equipment used: Rolling walker (2 wheeled) Transfers:  Stand Pivot Transfers;Squat Pivot Transfers   Stand pivot transfers: Mod assist;From elevated surface;+2 safety/equipment Squat pivot transfers: Min assist     General transfer comment: assisted from elevated bed to [facility] then to amb with Min Assist and 50% VC's on proper hand placement and safety with turns  Ambulation/Gait Ambulation/Gait assistance: Min assist Gait Distance (Feet): 10 Feet Assistive device: Rolling walker (2 wheeled) Gait Pattern/deviations: Step-to pattern;Decreased stance time - right Gait velocity: decreased    General Gait Details: amb a functional distance with walker and 50% VC's on proper walker to self distance and upright posture   Stairs             Wheelchair Mobility    Modified Rankin (Stroke Patients Only)       Balance                                            Cognition                                              Exercises      General Comments        Pertinent Vitals/Pain      Home Living  Prior Function            PT Goals (current goals can now be found in the care plan section) Progress towards PT goals: Progressing toward goals    Frequency    Min 3X/week      PT Plan Current plan remains appropriate    Co-evaluation              AM-PAC PT "6 Clicks" Mobility   Outcome Measure  Help needed turning from your back to your side while in a flat bed without using bedrails?: A Little Help needed moving from lying on your back to sitting on the side of a flat bed without using bedrails?: A Little Help needed moving to and from a bed to a chair (including a wheelchair)?: A Little Help needed standing up from a chair using your arms (e.g., wheelchair or bedside chair)?: A Little Help needed to walk in hospital room?: A Little Help needed climbing 3-5 steps with a railing? : A Lot 6 Click Score: 17    End of Session Equipment  Utilized During Treatment: Gait belt Activity Tolerance: Patient tolerated treatment well Patient left: in chair;with chair alarm set;with family/visitor present;with call bell/phone within reach Nurse Communication: Mobility status PT Visit Diagnosis: Unsteadiness on feet (R26.81);History of falling (Z91.81);Muscle weakness (generalized) (M62.81);Difficulty in walking, not elsewhere classified (R26.2)     Time: 7341-9379 PT Time Calculation (min) (ACUTE ONLY): 60 min  Charges:  $Gait Training: 8-22 mins $Therapeutic Exercise: 8-22 mins $Therapeutic Activity: 8-22 mins $Self Care/Home Management: 8-22                     Rica Koyanagi  PTA Acute  Rehabilitation Services Pager      [phone] Office      [phone]

## 2018-10-26 NOTE — Discharge Summary (Signed)
Physician Discharge Summary   Patient ID: Brandy [name] MRN: [id number] DOB/AGE: 1933-04-24 85 y.o.  Admit date: 10/24/2018 Discharge date: 10/26/2018  Primary Care Physician:  Kathyrn Lass, MD   Recommendations for Outpatient Follow-up:  1. Follow-up with orthopedics in 2 weeks  Home Health: Home health PT OT Equipment/Devices: Rolling walker  Discharge Condition: stable  CODE STATUS: FULL  Diet recommendation: Heart healthy diet   Discharge Diagnoses:   Right hip fracture status post surgery Mild to moderate aortic stenosis Essential hypertension History of depression Chronic anemia   Consults: Orthopedics    Allergies:  No Known Allergies   DISCHARGE MEDICATIONS: Allergies as of 10/26/2018   No Known Allergies     Medication List    TAKE these medications   acetaminophen 500 MG tablet Commonly known as:  TYLENOL Take 1,000 mg by mouth every 6 (six) hours as needed (pain).   amLODipine 10 MG tablet Commonly known as:  NORVASC Take 10 mg by mouth daily.   aspirin EC 325 MG tablet Take 1 tablet (325 mg total) by mouth 2 (two) times daily for 21 days. Then take one 81 mg aspirin once a day for three weeks. Then discontinue aspirin.   atorvastatin 40 MG tablet Commonly known as:  LIPITOR Take 1 tablet by mouth daily.   citalopram 40 MG tablet Commonly known as:  CELEXA Take 1 tablet by mouth daily.   docusate sodium 100 MG capsule Commonly known as:  COLACE Take 1 capsule (100 mg total) by mouth daily.   ferrous sulfate 325 (65 FE) MG tablet Take 325 mg by mouth daily with breakfast.   gabapentin 300 MG capsule Commonly known as:  NEURONTIN Take 1 capsule (300 mg total) by mouth at bedtime. What changed:  See the new instructions.   HYDROcodone-acetaminophen 5-325 MG tablet Commonly known as:  NORCO/VICODIN Take 1 tablet by mouth every 6 (six) hours as needed for severe pain.   methocarbamol 500 MG tablet Commonly known as:  ROBAXIN Take  1 tablet (500 mg total) by mouth every 6 (six) hours as needed for muscle spasms.   multivitamin with minerals Tabs tablet Take 1 tablet by mouth daily.   omeprazole 40 MG capsule Commonly known as:  PRILOSEC TAKE 1 CAPSULE (40 MG TOTAL) BY MOUTH 2 (TWO) TIMES DAILY BEFORE A MEAL.   spironolactone 25 MG tablet Commonly known as:  ALDACTONE Take 0.5 tablets (12.5 mg total) by mouth daily. Please make appt for future refills FINAL ATTEMPT   traMADol 50 MG tablet Commonly known as:  ULTRAM Take 100 mg by mouth 4 (four) times daily as needed for pain.   valsartan-hydrochlorothiazide 320-25 MG tablet Commonly known as:  DIOVAN-HCT Take 1 tablet by mouth daily.   VITAMIN B-12 PO Take 1 tablet by mouth daily.   Vitamin D (Ergocalciferol) 1.25 MG (50000 UT) Caps capsule Commonly known as:  DRISDOL Take 50,000 Units by mouth every 7 (seven) days. Every Friday            Durable Medical Equipment  (From admission, onward)         Start     Ordered   10/26/18 1051  For home use only DME Walker rolling  Once    Comments:  youth  Question:  Patient needs a walker to treat with the following condition  Answer:  Hip fracture ([place])   10/26/18 1051           Discharge Care Instructions  (From admission, onward)  Start     Ordered   10/25/18 0000  Weight bearing as tolerated     10/25/18 1603   10/25/18 0000  Discharge wound care:    Comments:  If you have a hip bandage, keep it clean and dry.  Change your bandage as instructed by your health care providers.  If your bandage has been discontinued, keep your incision clean and dry. You may shower 3 days after surgery, apply a new dressing after getting out of the shower.   10/25/18 1603           Brief H and P: For complete details please refer to admission H and P, but in brief 85 year old female past medical history significant for hypertension, hyperlipidemia, moderate aortic stenosis as per echocardiogram  done in 2018, nephrolithiasis, GERD, breast cancer, asthma, arthritis and anemia.Patient normally walks around with a walker. Patient reports falling every 2 to 3 months. Patient tripped and fell 3 days ago was backing out of the bathroom. Following the fall, patient developed right hip pain but did not come to the hospital or see any provider. Earlier today, patient heard a "pop", with associated worsening right hip pain and decided to come to the hospital. No chest pain, no shortness of breath, no limitation of mobility prior to falls, no dyspnea on exertion, no leg edema, no headache, no neck pain, no fever or chills, no URI symptoms, no GI symptoms or urinary symptoms  Hospital Course:  Right hip fracture -Secondary to mechanical fall.  Orthopedic surgery was consulted. -Patient underwent right intramedullary intertrochanteric nail surgery on 4/20 -PT OT evaluations were done, recommended home health PT -Orthopedics recommended aspirin after discharge for DVT prophylaxis, follow outpatient  Mild to moderate aortic stenosis -Noted on 2D echo done in 2018, currently asymptomatic outpatient follow-up with her cardiologist   Essential hypertension -BP currently stable, continue valsartan HCTZ, amlodipine, spironolactone  Depression -Continue Celexa  Chronic anemia -H&H currently stable, hemoglobin 9.4 at the time of discharge   Day of Discharge S: No acute complaints, hoping to go home today  BP (!) 158/69 (BP Location: Right Arm)   Pulse 95   Temp 98.6 F (37 C) (Oral)   Resp 14   Ht 5\' 3"  (1.6 m)   Wt 63.5 kg   SpO2 95%   BMI 24.80 kg/m   Physical Exam: General: Alert and awake oriented x3 not in any acute distress. HEENT: anicteric sclera, pupils reactive to light and accommodation CVS: S1-S2 clear no murmur rubs or gallops Chest: clear to auscultation bilaterally, no wheezing rales or rhonchi Abdomen: soft nontender, nondistended, normal bowel  sounds Extremities: no cyanosis, clubbing or edema noted bilaterally Neuro: Cranial nerves II-XII intact, no focal neurological deficits   The results of significant diagnostics from this hospitalization (including imaging, microbiology, ancillary and laboratory) are listed below for reference.      Procedures/Studies:  Dg Chest 1 View  Result Date: 10/24/2018 CLINICAL DATA:  85 year old female with a fall and hip pain EXAM: CHEST  1 VIEW COMPARISON:  11/19/2016 FINDINGS: Cardiomediastinal silhouette unchanged in size and contour. Low lung volumes with linear opacities at the lung bases. No pneumothorax or pleural effusion. No confluent airspace disease. Changes of prior left shoulder arthroplasty. Degenerative changes of the right shoulder. IMPRESSION: Chronic changes without evidence of acute cardiopulmonary disease Electronically Signed   By: Corrie Mckusick D.O.   On: 10/24/2018 16:37   Ct Head Wo Contrast  Result Date: 10/24/2018 CLINICAL DATA:  Fall 3-4 days  ago.  Hit right-side of head. EXAM: CT HEAD WITHOUT CONTRAST TECHNIQUE: Contiguous axial images were obtained from the base of the skull through the vertex without intravenous contrast. COMPARISON:  CT head dated August 21, 2015. FINDINGS: Brain: No evidence of acute infarction, hemorrhage, hydrocephalus, extra-axial collection or mass lesion/mass effect. Stable moderate atrophy and chronic microvascular ischemic changes. Vascular: Calcified atherosclerosis at the skullbase. No hyperdense vessel. Skull: Negative for fracture or focal lesion. Old left zygomatic arch fracture. Sinuses/Orbits: No acute finding. Other: None. IMPRESSION: 1. No acute intracranial abnormality. Stable moderate atrophy and chronic microvascular ischemic changes. Electronically Signed   By: Titus Dubin M.D.   On: 10/24/2018 17:49   Dg C-arm 1-60 Min-no Report  Result Date: 10/24/2018 Fluoroscopy was utilized by the requesting physician.  No radiographic  interpretation.   Dg Hip Operative Unilat W Or W/o Pelvis Right  Result Date: 10/24/2018 CLINICAL DATA:  Right IM nail. EXAM: OPERATIVE RIGHT HIP (WITH PELVIS IF PERFORMED) 5 VIEWS TECHNIQUE: Fluoroscopic spot image(s) were submitted for interpretation post-operatively. COMPARISON:  10/24/2018 FINDINGS: Multiple intraoperative spot images demonstrate internal fixation with IM nail across the right femoral neck fracture. Anatomic alignment. No hardware complicating feature. IMPRESSION: Internal fixation.  No complicating feature. Electronically Signed   By: Rolm Baptise M.D.   On: 10/24/2018 22:54   Dg Hip Unilat W Or Wo Pelvis 2-3 Views Right  Result Date: 10/24/2018 CLINICAL DATA:  85 year old who fell 3 days ago and complains of persistent RIGHT hip and RIGHT UPPER leg pain. Initial encounter. EXAM: DG HIP (WITH OR WITHOUT PELVIS) 2-3V RIGHT COMPARISON:  Bone window images from CT abdomen and pelvis 11/15/2016. FINDINGS: Acute comminuted intertrochanteric RIGHT femoral neck fracture. RIGHT hip joint anatomically aligned with minimal to mild joint space narrowing. Symmetric minimal to mild joint space narrowing in the contralateral LEFT hip. Sacroiliac joints and symphysis pubis anatomically aligned without diastasis and demonstrating degenerative changes. Osseous demineralization. Degenerative changes involving the visualized LOWER lumbar spine. IMPRESSION: Acute comminuted intertrochanteric RIGHT femoral neck fracture. Electronically Signed   By: Evangeline Dakin M.D.   On: 10/24/2018 16:52   Dg Femur Min 2 Views Right  Result Date: 10/24/2018 CLINICAL DATA:  85 year old who fell 3 days ago and complains of persistent RIGHT hip and RIGHT UPPER leg pain. Initial encounter. EXAM: RIGHT FEMUR 2 VIEWS COMPARISON:  None. These images are read in conjunction with the RIGHT hip x-rays obtained concurrently. FINDINGS: The intertrochanteric RIGHT femoral neck fracture is not included on these images. No  fractures elsewhere involving the RIGHT femur. Osseous demineralization. Severe tricompartment osteoarthritis involving the knee. IMPRESSION: 1. No fractures elsewhere involving the RIGHT femur. 2. Osseous demineralization. 3. Severe tricompartment osteoarthritis involving the knee. Electronically Signed   By: Evangeline Dakin M.D.   On: 10/24/2018 16:53       LAB RESULTS: Basic Metabolic Panel: Recent Labs  Lab 10/24/18 1900 10/25/18 0400 10/26/18 0425  NA  --  138 140  K  --  4.3 3.6  CL  --  110 108  CO2  --  15* 24  GLUCOSE  --  172* 109*  BUN  --  25* 20  CREATININE 1.16* 0.96 0.80  CALCIUM  --  8.5* 9.0  MG 2.0  --   --   PHOS 4.1  --   --    Liver Function Tests: No results for input(s): AST, ALT, ALKPHOS, BILITOT, PROT, ALBUMIN in the last 168 hours. No results for input(s): LIPASE, AMYLASE in the last 168  hours. No results for input(s): AMMONIA in the last 168 hours. CBC: Recent Labs  Lab 10/24/18 1546  10/25/18 0400 10/26/18 0425  WBC 11.2*   < > 8.7 9.2  NEUTROABS 8.1*  --   --   --   HGB 9.7*   < > 8.6* 9.4*  HCT 31.4*   < > 28.4* 30.6*  MCV 100.3*   < > 100.7* 100.3*  PLT 235   < > 208 241   < > = values in this interval not displayed.   Cardiac Enzymes: Recent Labs  Lab 10/24/18 1900 10/24/18 2348  TROPONINI <0.03 <0.03   BNP: Invalid input(s): POCBNP CBG: No results for input(s): GLUCAP in the last 168 hours.    Disposition and Follow-up: Discharge Instructions    Call MD / Call 911   Complete by:  As directed    If you experience chest pain or shortness of breath, CALL 911 and be transported to the hospital emergency room.  If you develope a fever above 101 F, pus (white drainage) or increased drainage or redness at the wound, or calf pain, call your surgeon's office.   Constipation Prevention   Complete by:  As directed    Drink plenty of fluids.  Prune juice may be helpful.  You may use a stool softener, such as Colace (over the  counter) 100 mg twice a day.  Use MiraLax (over the counter) for constipation as needed.   Diet - low sodium heart healthy   Complete by:  As directed    Diet - low sodium heart healthy   Complete by:  As directed    Discharge wound care:   Complete by:  As directed    If you have a hip bandage, keep it clean and dry.  Change your bandage as instructed by your health care providers.  If your bandage has been discontinued, keep your incision clean and dry. You may shower 3 days after surgery, apply a new dressing after getting out of the shower.   Increase activity slowly   Complete by:  As directed    Weight bearing as tolerated   Complete by:  As directed        DISPOSITION: [place]    Gaynelle Arabian, MD. Schedule an appointment as soon as possible for a visit on 11/08/2018.   Specialty:  Orthopedic Surgery Contact information: [address] [place] [address] [phone]            Time coordinating discharge:  35 minutes  Signed:   Estill Cotta M.D. [facility] 10/26/2018, 12:17 PM

## 2018-10-26 NOTE — TOC Transition Note (Signed)
Transition of Care [facility]) - CM/SW Discharge Note   Patient Details  Name: Brandy [name] MRN: [id number] Date of Birth: 23-Feb-1933  Transition of Care [facility]) CM/SW Contact:  Leeroy Cha, RN Phone Number: 10/26/2018, 11:49 AM   Clinical Narrative:    Discharge to go home with the daughter. c [facility] to do p.t. and youth roilling walker ordered through [facility].Has appointment with ortho md for f/u   Final next level of care: Home w Home Health Services Barriers to Discharge: No Barriers Identified   Patient Goals and CMS Choice Patient states their goals for this hospitalization and ongoing recovery are:: just to go home and get over this CMS Medicare.gov Compare Post Acute Care list provided to:: Patient Choice offered to / list presented to : Patient  Discharge Placement                       Discharge Plan and Services   Discharge Planning Services: CM Consult Post Acute Care Choice: Home Health, Durable Medical Equipment          DME Arranged: [place] [facility] youth DME Agency: [facility] HH Arranged: PT [place] Agency: [place] [facility]   Social Determinants of Health (SDOH) Interventions     Readmission Risk Interventions No flowsheet data found.

## 2018-10-26 NOTE — Progress Notes (Signed)
   Subjective: 2 Days Post-Op Procedure(s) (LRB): INTRAMEDULLARY (IM) NAIL INTERTROCHANTRIC (Right) Patient reports pain as mild.   Difficulty ambulating with PT yesterday  Objective: Vital signs in last 24 hours: Temp:  [98.7 F (37.1 C)-100.9 F (38.3 C)] 98.7 F (37.1 C) (04/22 0502) Pulse Rate:  [90-99] 90 (04/22 0502) Resp:  [16-18] 18 (04/22 0502) BP: (134-160)/(57-65) 160/65 (04/22 0502) SpO2:  [92 %-98 %] 93 % (04/22 0502)  Intake/Output from previous day:  Intake/Output Summary (Last 24 hours) at 10/26/2018 0626 Last data filed at 10/26/2018 0502 Gross per 24 hour  Intake 1209.75 ml  Output 2050 ml  Net -840.25 ml    Intake/Output this shift: Total I/O In: 71.2 [P.O.:60; I.V.:11.2] Out: 1300 [Urine:1300]  Labs: Recent Labs    10/24/18 1546 10/24/18 1900 10/25/18 0400 10/26/18 0425  HGB 9.7* 10.1* 8.6* 9.4*   Recent Labs    10/25/18 0400 10/26/18 0425  WBC 8.7 9.2  RBC 2.82* 3.05*  HCT 28.4* 30.6*  PLT 208 241   Recent Labs    10/25/18 0400 10/26/18 0425  NA 138 140  K 4.3 3.6  CL 110 108  CO2 15* 24  BUN 25* 20  CREATININE 0.96 0.80  GLUCOSE 172* 109*  CALCIUM 8.5* 9.0   No results for input(s): LABPT, INR in the last 72 hours.  EXAM General - Patient is Alert, Appropriate and Oriented Extremity - Neurologically intact Neurovascular intact No cellulitis present Compartment soft Dressing/Incision - clean, dry, no drainage Motor Function - intact, moving foot and toes well on exam.   Past Medical History:  Diagnosis Date  . Anemia   . Arthritis    shoulders  . Asthma    mild, no inhalers used  . Breast cancer ([place]) 12/18/11   left breast masectomy=metastatic ca in (1/1) lymph node ,invasive ductal ca,2 foci,,dcis,lymph ovascular invasion identified,surgical resection margins neg for ca,additional tissue=benign skin and subcutaneous tissue  . Bronchitis    hx of;last time >59yr ago  . Depression    takes Celexa daily  .  Difficult intubation   . Dysphagia   . Full dentures   . Gall stones 2015  . GERD (gastroesophageal reflux disease)    takes Prilosec prn  . H/O hiatal hernia   . History of kidney stones    many yrs ago  . Hx of radiation therapy 04/26/12 -06/10/12   left breast  . Hyperlipidemia    takes Simvastatin daily  . Hypertension    takes Amlodipine and Diovan daily  . Insomnia    takes Ambien nightly  . Urinary urgency     Assessment/Plan: 2 Days Post-Op Procedure(s) (LRB): INTRAMEDULLARY (IM) NAIL INTERTROCHANTRIC (Right) Active Problems:   S/P right hip fracture   Up with therapy  SNF vs home with home health based on progress with PT today. If she will Not be safe at home then probably will need SNF   DVT Prophylaxis - Lovenox in hospital then switch to ASA upon discharge Weight Bearing As Tolerated right Leg  Gaynelle Arabian 10/26/2018, 6:26 AM

## 2018-10-26 NOTE — Anesthesia Postprocedure Evaluation (Signed)
Anesthesia Post Note  Patient: Brandy [name]  Procedure(s) Performed: INTRAMEDULLARY (IM) NAIL INTERTROCHANTRIC (Right Hip)     Patient location during evaluation: PACU Anesthesia Type: General Level of consciousness: sedated Pain management: pain level controlled Vital Signs Assessment: post-procedure vital signs reviewed and stable Respiratory status: spontaneous breathing and respiratory function stable Cardiovascular status: stable Postop Assessment: no apparent nausea or vomiting Anesthetic complications: no                 Wilhelmina Hark DANIEL

## 2018-10-27 DIAGNOSIS — S72101D Unspecified trochanteric fracture of right femur, subsequent encounter for closed fracture with routine healing: Secondary | ICD-10-CM | POA: Diagnosis not present

## 2018-10-27 DIAGNOSIS — I10 Essential (primary) hypertension: Secondary | ICD-10-CM | POA: Diagnosis not present

## 2018-10-27 DIAGNOSIS — I350 Nonrheumatic aortic (valve) stenosis: Secondary | ICD-10-CM | POA: Diagnosis not present

## 2018-10-27 DIAGNOSIS — Z853 Personal history of malignant neoplasm of breast: Secondary | ICD-10-CM | POA: Diagnosis not present

## 2018-10-27 DIAGNOSIS — M1990 Unspecified osteoarthritis, unspecified site: Secondary | ICD-10-CM | POA: Diagnosis not present

## 2018-10-27 DIAGNOSIS — E785 Hyperlipidemia, unspecified: Secondary | ICD-10-CM | POA: Diagnosis not present

## 2018-10-27 DIAGNOSIS — R69 Illness, unspecified: Secondary | ICD-10-CM | POA: Diagnosis not present

## 2018-10-27 DIAGNOSIS — J45909 Unspecified asthma, uncomplicated: Secondary | ICD-10-CM | POA: Diagnosis not present

## 2018-10-27 DIAGNOSIS — R296 Repeated falls: Secondary | ICD-10-CM | POA: Diagnosis not present

## 2018-10-27 DIAGNOSIS — K219 Gastro-esophageal reflux disease without esophagitis: Secondary | ICD-10-CM | POA: Diagnosis not present

## 2018-10-29 ENCOUNTER — Inpatient Hospital Stay (HOSPITAL_COMMUNITY)
Admission: EM | Admit: 2018-10-29 | Discharge: 2018-11-01 | DRG: 193 | Disposition: A | Discharge status: Home Health | Payer: Medicare HMO | Attending: Internal Medicine | Admitting: Internal Medicine

## 2018-10-29 ENCOUNTER — Inpatient Hospital Stay (HOSPITAL_COMMUNITY): Payer: Medicare HMO

## 2018-10-29 ENCOUNTER — Emergency Department (HOSPITAL_COMMUNITY): Payer: Medicare HMO

## 2018-10-29 ENCOUNTER — Other Ambulatory Visit: Payer: Self-pay

## 2018-10-29 ENCOUNTER — Encounter (HOSPITAL_COMMUNITY): Payer: Self-pay | Admitting: Radiology

## 2018-10-29 DIAGNOSIS — Z87442 Personal history of urinary calculi: Secondary | ICD-10-CM

## 2018-10-29 DIAGNOSIS — Y95 Nosocomial condition: Secondary | ICD-10-CM | POA: Diagnosis present

## 2018-10-29 DIAGNOSIS — Z923 Personal history of irradiation: Secondary | ICD-10-CM

## 2018-10-29 DIAGNOSIS — Z03818 Encounter for observation for suspected exposure to other biological agents ruled out: Secondary | ICD-10-CM | POA: Diagnosis not present

## 2018-10-29 DIAGNOSIS — J189 Pneumonia, unspecified organism: Secondary | ICD-10-CM | POA: Diagnosis not present

## 2018-10-29 DIAGNOSIS — R69 Illness, unspecified: Secondary | ICD-10-CM | POA: Diagnosis not present

## 2018-10-29 DIAGNOSIS — F329 Major depressive disorder, single episode, unspecified: Secondary | ICD-10-CM | POA: Diagnosis present

## 2018-10-29 DIAGNOSIS — Z20828 Contact with and (suspected) exposure to other viral communicable diseases: Secondary | ICD-10-CM | POA: Diagnosis not present

## 2018-10-29 DIAGNOSIS — Z803 Family history of malignant neoplasm of breast: Secondary | ICD-10-CM

## 2018-10-29 DIAGNOSIS — Z79899 Other long term (current) drug therapy: Secondary | ICD-10-CM | POA: Diagnosis not present

## 2018-10-29 DIAGNOSIS — R0602 Shortness of breath: Secondary | ICD-10-CM | POA: Diagnosis not present

## 2018-10-29 DIAGNOSIS — Z96612 Presence of left artificial shoulder joint: Secondary | ICD-10-CM | POA: Diagnosis present

## 2018-10-29 DIAGNOSIS — E611 Iron deficiency: Secondary | ICD-10-CM | POA: Diagnosis present

## 2018-10-29 DIAGNOSIS — F0390 Unspecified dementia without behavioral disturbance: Secondary | ICD-10-CM | POA: Diagnosis present

## 2018-10-29 DIAGNOSIS — D649 Anemia, unspecified: Secondary | ICD-10-CM | POA: Diagnosis present

## 2018-10-29 DIAGNOSIS — E785 Hyperlipidemia, unspecified: Secondary | ICD-10-CM | POA: Diagnosis present

## 2018-10-29 DIAGNOSIS — J45909 Unspecified asthma, uncomplicated: Secondary | ICD-10-CM | POA: Diagnosis present

## 2018-10-29 DIAGNOSIS — J9601 Acute respiratory failure with hypoxia: Secondary | ICD-10-CM | POA: Diagnosis not present

## 2018-10-29 DIAGNOSIS — N179 Acute kidney failure, unspecified: Secondary | ICD-10-CM | POA: Diagnosis not present

## 2018-10-29 DIAGNOSIS — I350 Nonrheumatic aortic (valve) stenosis: Secondary | ICD-10-CM | POA: Diagnosis not present

## 2018-10-29 DIAGNOSIS — K219 Gastro-esophageal reflux disease without esophagitis: Secondary | ICD-10-CM | POA: Diagnosis present

## 2018-10-29 DIAGNOSIS — J159 Unspecified bacterial pneumonia: Principal | ICD-10-CM | POA: Diagnosis present

## 2018-10-29 DIAGNOSIS — Z66 Do not resuscitate: Secondary | ICD-10-CM | POA: Diagnosis present

## 2018-10-29 DIAGNOSIS — Z853 Personal history of malignant neoplasm of breast: Secondary | ICD-10-CM

## 2018-10-29 DIAGNOSIS — R0902 Hypoxemia: Secondary | ICD-10-CM | POA: Diagnosis not present

## 2018-10-29 DIAGNOSIS — I10 Essential (primary) hypertension: Secondary | ICD-10-CM | POA: Diagnosis present

## 2018-10-29 DIAGNOSIS — R1310 Dysphagia, unspecified: Secondary | ICD-10-CM

## 2018-10-29 DIAGNOSIS — Z8042 Family history of malignant neoplasm of prostate: Secondary | ICD-10-CM

## 2018-10-29 DIAGNOSIS — R609 Edema, unspecified: Secondary | ICD-10-CM | POA: Diagnosis not present

## 2018-10-29 DIAGNOSIS — Z8781 Personal history of (healed) traumatic fracture: Secondary | ICD-10-CM

## 2018-10-29 LAB — CBC WITH DIFFERENTIAL/PLATELET
Abs Immature Granulocytes: 0.1 10*3/uL — ABNORMAL HIGH (ref 0.00–0.07)
Basophils Absolute: 0 10*3/uL (ref 0.0–0.1)
Basophils Relative: 0 %
Eosinophils Absolute: 0.1 10*3/uL (ref 0.0–0.5)
Eosinophils Relative: 1 %
HCT: 30.1 % — ABNORMAL LOW (ref 36.0–46.0)
Hemoglobin: 9 g/dL — ABNORMAL LOW (ref 12.0–15.0)
Immature Granulocytes: 1 %
Lymphocytes Relative: 14 %
Lymphs Abs: 2.3 10*3/uL (ref 0.7–4.0)
MCH: 30.1 pg (ref 26.0–34.0)
MCHC: 29.9 g/dL — ABNORMAL LOW (ref 30.0–36.0)
MCV: 100.7 fL — ABNORMAL HIGH (ref 80.0–100.0)
Monocytes Absolute: 1.1 10*3/uL — ABNORMAL HIGH (ref 0.1–1.0)
Monocytes Relative: 7 %
Neutro Abs: 13.1 10*3/uL — ABNORMAL HIGH (ref 1.7–7.7)
Neutrophils Relative %: 77 %
Platelets: 327 10*3/uL (ref 150–400)
RBC: 2.99 MIL/uL — ABNORMAL LOW (ref 3.87–5.11)
RDW: 14.4 % (ref 11.5–15.5)
WBC: 16.7 10*3/uL — ABNORMAL HIGH (ref 4.0–10.5)
nRBC: 0 % (ref 0.0–0.2)

## 2018-10-29 LAB — COMPREHENSIVE METABOLIC PANEL
ALT: 19 U/L (ref 0–44)
AST: 25 U/L (ref 15–41)
Albumin: 2.9 g/dL — ABNORMAL LOW (ref 3.5–5.0)
Alkaline Phosphatase: 78 U/L (ref 38–126)
Anion gap: 13 (ref 5–15)
BUN: 56 mg/dL — ABNORMAL HIGH (ref 8–23)
CO2: 22 mmol/L (ref 22–32)
Calcium: 9.6 mg/dL (ref 8.9–10.3)
Chloride: 103 mmol/L (ref 98–111)
Creatinine, Ser: 1.54 mg/dL — ABNORMAL HIGH (ref 0.44–1.00)
GFR calc Af Amer: 36 mL/min — ABNORMAL LOW (ref >60)
GFR calc non Af Amer: 31 mL/min — ABNORMAL LOW (ref >60)
Glucose, Bld: 141 mg/dL — ABNORMAL HIGH (ref 70–99)
Potassium: 4.2 mmol/L (ref 3.5–5.1)
Sodium: 138 mmol/L (ref 135–145)
Total Bilirubin: 0.9 mg/dL (ref 0.3–1.2)
Total Protein: 6.4 g/dL — ABNORMAL LOW (ref 6.5–8.1)

## 2018-10-29 LAB — PROCALCITONIN: Procalcitonin: 0.1 ng/mL

## 2018-10-29 LAB — LACTATE DEHYDROGENASE: LDH: 207 U/L — ABNORMAL HIGH (ref 98–192)

## 2018-10-29 LAB — FIBRINOGEN: Fibrinogen: 628 mg/dL — ABNORMAL HIGH (ref 210–475)

## 2018-10-29 LAB — TRIGLYCERIDES: Triglycerides: 295 mg/dL — ABNORMAL HIGH (ref <150)

## 2018-10-29 LAB — LACTIC ACID, PLASMA: Lactic Acid, Venous: 1.3 mmol/L (ref 0.5–1.9)

## 2018-10-29 LAB — C-REACTIVE PROTEIN: CRP: 8.8 mg/dL — ABNORMAL HIGH (ref <1.0)

## 2018-10-29 LAB — D-DIMER, QUANTITATIVE (NOT AT ARMC): D-Dimer, Quant: 3.9 ug/mL-FEU — ABNORMAL HIGH (ref 0.00–0.50)

## 2018-10-29 LAB — FERRITIN: Ferritin: 121 ng/mL (ref 11–307)

## 2018-10-29 LAB — SARS CORONAVIRUS 2 BY RT PCR (HOSPITAL ORDER, PERFORMED IN ~~LOC~~ HOSPITAL LAB): SARS Coronavirus 2: NEGATIVE

## 2018-10-29 MED ORDER — LACTATED RINGERS IV SOLN
INTRAVENOUS | Status: DC
  Administered 2018-10-29: 23:00:00 via INTRAVENOUS

## 2018-10-29 MED ORDER — PANTOPRAZOLE SODIUM 40 MG PO TBEC
40.0000 mg | DELAYED_RELEASE_TABLET | Freq: Two times a day (BID) | ORAL | Status: DC
  Administered 2018-10-29 – 2018-11-01 (×6): 40 mg via ORAL
  Filled 2018-10-29 (×6): qty 1

## 2018-10-29 MED ORDER — ACETAMINOPHEN 325 MG PO TABS
650.0000 mg | ORAL_TABLET | Freq: Four times a day (QID) | ORAL | Status: DC | PRN
  Administered 2018-10-31 (×2): 650 mg via ORAL
  Filled 2018-10-29 (×2): qty 2

## 2018-10-29 MED ORDER — DOCUSATE SODIUM 100 MG PO CAPS
100.0000 mg | ORAL_CAPSULE | Freq: Two times a day (BID) | ORAL | Status: DC
  Administered 2018-10-29 – 2018-11-01 (×5): 100 mg via ORAL
  Filled 2018-10-29 (×5): qty 1

## 2018-10-29 MED ORDER — TRAMADOL HCL 50 MG PO TABS
100.0000 mg | ORAL_TABLET | Freq: Two times a day (BID) | ORAL | Status: DC
  Administered 2018-10-29 – 2018-10-31 (×4): 100 mg via ORAL
  Filled 2018-10-29 (×4): qty 2

## 2018-10-29 MED ORDER — BISACODYL 5 MG PO TBEC: 5.0000 mg | DELAYED_RELEASE_TABLET | Freq: Every day | ORAL | Status: DC | PRN

## 2018-10-29 MED ORDER — VANCOMYCIN HCL IN DEXTROSE 1-5 GM/200ML-% IV SOLN
1000.0000 mg | INTRAVENOUS | Status: DC
  Administered 2018-10-31: 1000 mg via INTRAVENOUS
  Filled 2018-10-29 (×3): qty 200

## 2018-10-29 MED ORDER — GABAPENTIN 300 MG PO CAPS
300.0000 mg | ORAL_CAPSULE | Freq: Every day | ORAL | Status: DC
  Administered 2018-10-29 – 2018-10-31 (×3): 300 mg via ORAL
  Filled 2018-10-29 (×3): qty 1

## 2018-10-29 MED ORDER — ATORVASTATIN CALCIUM 40 MG PO TABS
40.0000 mg | ORAL_TABLET | Freq: Every day | ORAL | Status: DC
  Administered 2018-10-30 – 2018-11-01 (×3): 40 mg via ORAL
  Filled 2018-10-29 (×3): qty 1

## 2018-10-29 MED ORDER — SODIUM CHLORIDE 0.9 % IV SOLN
2.0000 g | INTRAVENOUS | Status: DC
  Administered 2018-10-30 – 2018-10-31 (×2): 2 g via INTRAVENOUS
  Filled 2018-10-29 (×3): qty 2

## 2018-10-29 MED ORDER — ONDANSETRON HCL 4 MG PO TABS: 4.0000 mg | ORAL_TABLET | Freq: Four times a day (QID) | ORAL | Status: DC | PRN

## 2018-10-29 MED ORDER — IOHEXOL 350 MG/ML SOLN
50.0000 mL | Freq: Once | INTRAVENOUS | Status: AC | PRN
  Administered 2018-10-29: 50 mL via INTRAVENOUS

## 2018-10-29 MED ORDER — POLYETHYLENE GLYCOL 3350 17 G PO PACK: 17.0000 g | PACK | Freq: Every day | ORAL | Status: DC | PRN

## 2018-10-29 MED ORDER — SODIUM CHLORIDE 0.9 % IV BOLUS
500.0000 mL | Freq: Once | INTRAVENOUS | Status: AC
  Administered 2018-10-29: 500 mL via INTRAVENOUS

## 2018-10-29 MED ORDER — CITALOPRAM HYDROBROMIDE 20 MG PO TABS
40.0000 mg | ORAL_TABLET | Freq: Every day | ORAL | Status: DC
  Administered 2018-10-30 – 2018-11-01 (×3): 40 mg via ORAL
  Filled 2018-10-29: qty 4
  Filled 2018-10-29: qty 2
  Filled 2018-10-29: qty 4
  Filled 2018-10-29: qty 2

## 2018-10-29 MED ORDER — SODIUM CHLORIDE 0.9% FLUSH
3.0000 mL | Freq: Two times a day (BID) | INTRAVENOUS | Status: DC
  Administered 2018-10-29 – 2018-10-31 (×4): 3 mL via INTRAVENOUS

## 2018-10-29 MED ORDER — ENOXAPARIN SODIUM 40 MG/0.4ML ~~LOC~~ SOLN
40.0000 mg | SUBCUTANEOUS | Status: DC
  Administered 2018-10-29: 40 mg via SUBCUTANEOUS
  Filled 2018-10-29: qty 0.4

## 2018-10-29 MED ORDER — SODIUM CHLORIDE 0.9 % IV SOLN
1.0000 g | Freq: Once | INTRAVENOUS | Status: AC
  Administered 2018-10-29: 1 g via INTRAVENOUS
  Filled 2018-10-29 (×2): qty 1

## 2018-10-29 MED ORDER — HYDROCODONE-ACETAMINOPHEN 5-325 MG PO TABS
1.0000 | ORAL_TABLET | Freq: Four times a day (QID) | ORAL | Status: DC | PRN
  Administered 2018-11-01: 1 via ORAL
  Filled 2018-10-29: qty 1

## 2018-10-29 MED ORDER — SODIUM CHLORIDE 0.9 % IV SOLN
1.0000 g | Freq: Once | INTRAVENOUS | Status: AC
  Administered 2018-10-29: 1 g via INTRAVENOUS
  Filled 2018-10-29: qty 1

## 2018-10-29 MED ORDER — METHOCARBAMOL 500 MG PO TABS
500.0000 mg | ORAL_TABLET | Freq: Four times a day (QID) | ORAL | Status: DC | PRN
  Administered 2018-10-30: 500 mg via ORAL
  Filled 2018-10-29: qty 1

## 2018-10-29 MED ORDER — ONDANSETRON HCL 4 MG/2ML IJ SOLN: 4.0000 mg | Freq: Four times a day (QID) | INTRAMUSCULAR | Status: DC | PRN

## 2018-10-29 MED ORDER — ALBUTEROL SULFATE HFA 108 (90 BASE) MCG/ACT IN AERS
2.0000 | INHALATION_SPRAY | Freq: Four times a day (QID) | RESPIRATORY_TRACT | Status: DC
  Administered 2018-10-29 – 2018-11-01 (×10): 2 via RESPIRATORY_TRACT
  Filled 2018-10-29 (×2): qty 6.7

## 2018-10-29 MED ORDER — ASPIRIN EC 325 MG PO TBEC
325.0000 mg | DELAYED_RELEASE_TABLET | Freq: Two times a day (BID) | ORAL | Status: DC
  Administered 2018-10-29: 325 mg via ORAL
  Filled 2018-10-29: qty 1

## 2018-10-29 MED ORDER — VANCOMYCIN HCL IN DEXTROSE 1-5 GM/200ML-% IV SOLN
1000.0000 mg | Freq: Once | INTRAVENOUS | Status: AC
  Administered 2018-10-29: 1000 mg via INTRAVENOUS
  Filled 2018-10-29: qty 200

## 2018-10-29 NOTE — ED Notes (Signed)
ED TO INPATIENT HANDOFF REPORT  ED Nurse Name and Phone #: Caprice Kluver [phone]  S Name/Age/Gender Brandy [name] 85 y.o. female Room/Bed: 017C/017C  Code Status   Code Status: Prior  Home/SNF/Other Home Patient oriented to: self, place, time and situation Is this baseline? Yes   Triage Complete: Triage complete  Chief Complaint possible blood clot  Triage Note Pt was d/c'd from [place] on 4/22, sent by MD for possible blood clot   Allergies No Known Allergies  Level of Care/Admitting Diagnosis ED Disposition    ED Disposition Condition [place]: [place] [100100]  Level of Care: Telemetry Medical [104]  Covid Evaluation: Person Under Investigation (PUI)  Isolation Risk Level: Low Risk  (Less than 4L [place] supplementation)  Diagnosis: Acute respiratory failure with hypoxia [facility]) [[id number]]  Admitting Physician: Karmen Bongo [[id number]]  Attending Physician: Karmen Bongo [[id number]]  Estimated length of stay: 3 - 4 days  Certification:: I certify this patient will need inpatient services for at least 2 midnights  PT Class (Do Not Modify): Inpatient [101]  PT Acc Code (Do Not Modify): Private [1]       B Medical/Surgery History Past Medical History:  Diagnosis Date  . Anemia   . Arthritis    shoulders  . Asthma    mild, no inhalers used  . Breast cancer ([place]) 12/18/11   left breast masectomy=metastatic ca in (1/1) lymph node ,invasive ductal ca,2 foci,,dcis,lymph ovascular invasion identified,surgical resection margins neg for ca,additional tissue=benign skin and subcutaneous tissue  . Bronchitis    hx of;last time >74yr ago  . Depression    takes Celexa daily  . Difficult intubation   . Dysphagia   . Full dentures   . Gall stones 2015  . GERD (gastroesophageal reflux disease)    takes Prilosec prn  . H/O hiatal hernia   . History of kidney stones    many yrs ago  . Hx of radiation therapy 04/26/12 -06/10/12   left breast   . Hyperlipidemia    takes Simvastatin daily  . Hypertension    takes Amlodipine and Diovan daily  . Insomnia    takes Ambien nightly  . Urinary urgency    Past Surgical History:  Procedure Laterality Date  . APPENDECTOMY    . bladder tack    . BREAST BIOPSY  1998   left   . DILATION AND CURETTAGE OF UTERUS    . ENDOSCOPIC RETROGRADE CHOLANGIOPANCREATOGRAPHY (ERCP) WITH PROPOFOL N/A 02/01/2014   Procedure: ENDOSCOPIC RETROGRADE CHOLANGIOPANCREATOGRAPHY (ERCP) WITH PROPOFOL;  Surgeon: [name], MD;  [facility];  Service: Endoscopy;  Laterality: N/A;  . ESOPHAGOGASTRODUODENOSCOPY    . ESOPHAGOGASTRODUODENOSCOPY N/A 06/26/2016   Procedure: ESOPHAGOGASTRODUODENOSCOPY (EGD);  Surgeon: [name], MD;  Location: [place];  Service: Endoscopy;  Laterality: N/A;  . ESOPHAGOGASTRODUODENOSCOPY (EGD) WITH PROPOFOL  12/19/2013   Procedure: ESOPHAGOGASTRODUODENOSCOPY (EGD) WITH PROPOFOL;  Surgeon: Beryle Beams, MD;  Location: [place];  Service: Endoscopy;;  . ESOPHAGOGASTRODUODENOSCOPY (EGD) WITH PROPOFOL N/A 09/10/2016   Procedure: ESOPHAGOGASTRODUODENOSCOPY (EGD) WITH PROPOFOL;  Surgeon: [name], MD;  [facility];  Service: Endoscopy;  Laterality: N/A;  . EUS  06/04/2011   Procedure: UPPER ENDOSCOPIC ULTRASOUND (EUS) LINEAR;  Surgeon: Owens Loffler, MD;  [facility];  Service: Endoscopy;  Laterality: N/A;  . EUS N/A 02/01/2014   Procedure: UPPER ENDOSCOPIC ULTRASOUND (EUS) LINEAR;  Surgeon: [name], MD;  [facility];  Service: Endoscopy;  Laterality: N/A;  . EXPLORATORY LAPAROTOMY      biopsy of intra-abdominal mass  . INTRAMEDULLARY (IM) NAIL INTERTROCHANTERIC Right 10/24/2018   Procedure: INTRAMEDULLARY (IM) NAIL INTERTROCHANTRIC;  Surgeon: Gaynelle Arabian, MD;  [facility];  Service: Orthopedics;  Laterality: Right;  . LAPAROTOMY N/A 11/17/2016   Procedure: EXPLORATORY LAPAROTOMY WITH LYSIS OF ADHESIONS, SMALL  BOWEL RESECTION, REPAIR OF INCARCERATED VENTRAL INCISIONAL HERNIA, INSERTION OF BIOLOGIC MESH PATCH,APPLICATION OF WOUND VAC DRESSING;  Surgeon: Armandina Gemma, MD;  [facility];  Service: General;  Laterality: N/A;  . MASTECTOMY W/ SENTINEL NODE BIOPSY  12/18/2011   Procedure: MASTECTOMY WITH SENTINEL LYMPH NODE BIOPSY;  Surgeon: Rolm Bookbinder, MD;  Location: [place];  Service: General;  Laterality: Left;  . PORT-A-CATH REMOVAL Right 06/15/2013   Procedure: REMOVAL PORT-A-CATH;  Surgeon: Rolm Bookbinder, MD;  Location: [place];  Service: General;  Laterality: Right;  . PORTACATH PLACEMENT  01/27/2012   Procedure: INSERTION PORT-A-CATH;  Surgeon: Rolm Bookbinder, MD;  Location: [place];  Service: General;  Laterality: Right;  PORT PLACEMENT  . TOTAL SHOULDER REPLACEMENT  2011   left  . TUBAL LIGATION       A IV Location/Drains/Wounds Patient Lines/Drains/Airways Status   Active Line/Drains/Airways    Name:   Placement date:   Placement time:   Site:   Days:   Peripheral IV 10/29/18 Antecubital   10/29/18    1410    Antecubital   less than 1   Negative Pressure Wound Therapy Abdomen   11/17/16    1734    -   711   External Urinary Catheter   10/25/18    0900    -   4   External Urinary Catheter   10/29/18    1254    -   less than 1   Incision (Closed) 10/24/18 Hip   10/24/18    2145     5          Intake/Output Last 24 hours  Intake/Output Summary (Last 24 hours) at 10/29/2018 1541 Last data filed at 10/29/2018 1516 Gross per 24 hour  Intake 200 ml  Output -  Net 200 ml    Labs/Imaging Results for orders placed or performed during the hospital encounter of 10/29/18 (from the past 48 hour(s))  SARS Coronavirus 2 [facility] order, Performed in [place] hospital lab)     Status: None   Collection Time: 10/29/18  1:25 PM  Result Value Ref Range   SARS Coronavirus 2 NEGATIVE NEGATIVE    Comment: (NOTE) If result is NEGATIVE SARS-CoV-2  target nucleic acids are NOT DETECTED. The SARS-CoV-2 RNA is generally detectable in upper and lower  respiratory specimens during the acute phase of infection. The lowest  concentration of SARS-CoV-2 viral copies this assay can detect is 250  copies / mL. A negative result does not preclude SARS-CoV-2 infection  and should not be used as the sole basis for treatment or other  patient management decisions.  A negative result may occur with  improper specimen collection / handling, submission of specimen other  than nasopharyngeal swab, presence of viral mutation(s) within the  areas targeted by this assay, and inadequate number of viral copies  (<250 copies / mL). A negative result must be combined with clinical  observations, patient history, and epidemiological information. If result is POSITIVE SARS-CoV-2 target nucleic acids are DETECTED. The SARS-CoV-2 RNA is generally detectable in upper and lower  respiratory specimens dur ing  the acute phase of infection.  Positive  results are indicative of active infection with SARS-CoV-2.  Clinical  correlation with patient history and other diagnostic information is  necessary to determine patient infection status.  Positive results do  not rule out bacterial infection or co-infection with other viruses. If result is PRESUMPTIVE POSTIVE SARS-CoV-2 nucleic acids MAY BE PRESENT.   A presumptive positive result was obtained on the submitted specimen  and confirmed on repeat testing.  While 2019 novel coronavirus  (SARS-CoV-2) nucleic acids may be present in the submitted sample  additional confirmatory testing may be necessary for epidemiological  and / or clinical management purposes  to differentiate between  SARS-CoV-2 and other Sarbecovirus currently known to infect humans.  If clinically indicated additional testing with an alternate test  methodology [phone]) is advised. The SARS-CoV-2 RNA is generally  detectable in upper and lower  respiratory sp ecimens during the acute  phase of infection. The expected result is Negative. Fact Sheet for Patients:  StrictlyIdeas.no Fact Sheet for Healthcare Providers: BankingDealers.co.za This test is not yet approved or cleared by the [place] FDA and has been authorized for detection and/or diagnosis of SARS-CoV-2 by FDA under an Emergency Use Authorization (EUA).  This EUA will remain in effect (meaning this test can be used) for the duration of the COVID-19 declaration under Section 564(b)(1) of the Act, 21 U.S.C. section 360bbb-3(b)(1), unless the authorization is terminated or revoked sooner. Performed at [facility], [place] [address] [place], [place] [address]   Lactic acid, plasma     Status: None   Collection Time: 10/29/18  1:40 PM  Result Value Ref Range   Lactic Acid, Venous 1.3 0.5 - 1.9 mmol/L    Comment: Performed at [facility] [address] [place], [place] [address]  CBC WITH DIFFERENTIAL     Status: Abnormal   Collection Time: 10/29/18  1:40 PM  Result Value Ref Range   WBC 16.7 (H) 4.0 - 10.5 K/uL   RBC 2.99 (L) 3.87 - 5.11 MIL/uL   Hemoglobin 9.0 (L) 12.0 - 15.0 g/dL   HCT 30.1 (L) 36.0 - 46.0 %   MCV 100.7 (H) 80.0 - 100.0 fL   MCH 30.1 26.0 - 34.0 pg   MCHC 29.9 (L) 30.0 - 36.0 g/dL   RDW 14.4 11.5 - 15.5 %   Platelets 327 150 - 400 K/uL   nRBC 0.0 0.0 - 0.2 %   Neutrophils Relative % 77 %   Neutro Abs 13.1 (H) 1.7 - 7.7 K/uL   Lymphocytes Relative 14 %   Lymphs Abs 2.3 0.7 - 4.0 K/uL   Monocytes Relative 7 %   Monocytes Absolute 1.1 (H) 0.1 - 1.0 K/uL   Eosinophils Relative 1 %   Eosinophils Absolute 0.1 0.0 - 0.5 K/uL   Basophils Relative 0 %   Basophils Absolute 0.0 0.0 - 0.1 K/uL   Immature Granulocytes 1 %   Abs Immature Granulocytes 0.10 (H) 0.00 - 0.07 K/uL    Comment: Performed at [facility], [address], [place], [place] [address]  Comprehensive  metabolic panel     Status: Abnormal   Collection Time: 10/29/18  1:40 PM  Result Value Ref Range   Sodium 138 135 - 145 mmol/L   Potassium 4.2 3.5 - 5.1 mmol/L   Chloride 103 98 - 111 mmol/L   CO2 22 22 - 32 mmol/L   Glucose, Bld 141 (H) 70 - 99 mg/dL  BUN 56 (H) 8 - 23 mg/dL   Creatinine, Ser 1.54 (H) 0.44 - 1.00 mg/dL   Calcium 9.6 8.9 - 10.3 mg/dL   Total Protein 6.4 (L) 6.5 - 8.1 g/dL   Albumin 2.9 (L) 3.5 - 5.0 g/dL   AST 25 15 - 41 U/L   ALT 19 0 - 44 U/L   Alkaline Phosphatase 78 38 - 126 U/L   Total Bilirubin 0.9 0.3 - 1.2 mg/dL   GFR calc non Af Amer 31 (L) >60 mL/min   GFR calc Af Amer 36 (L) >60 mL/min   Anion gap 13 5 - 15    Comment: Performed at [facility] [address], [place], [place] [address]  D-dimer, quantitative     Status: Abnormal   Collection Time: 10/29/18  1:40 PM  Result Value Ref Range   D-Dimer, Quant 3.90 (H) 0.00 - 0.50 ug/mL-FEU    Comment: (NOTE) At the manufacturer cut-off of 0.50 ug/mL FEU, this assay has been documented to exclude PE with a sensitivity and negative predictive value of 97 to 99%.  At this time, this assay has not been approved by the FDA to exclude DVT/VTE. Results should be correlated with clinical presentation. Performed at [facility], [place] [address] [place], [place] [address]   Procalcitonin     Status: None   Collection Time: 10/29/18  1:40 PM  Result Value Ref Range   Procalcitonin 0.10 ng/mL    Comment:        Interpretation: PCT (Procalcitonin) <= 0.5 ng/mL: Systemic infection (sepsis) is not likely. Local bacterial infection is possible. (NOTE)       Sepsis PCT Algorithm           Lower Respiratory Tract                                      Infection PCT Algorithm    ----------------------------     ----------------------------         PCT < 0.25 ng/mL                PCT < 0.10 ng/mL         Strongly encourage             Strongly discourage   discontinuation of antibiotics    initiation  of antibiotics    ----------------------------     -----------------------------       PCT 0.25 - 0.50 ng/mL            PCT 0.10 - 0.25 ng/mL               OR       >80% decrease in PCT            Discourage initiation of                                            antibiotics      Encourage discontinuation           of antibiotics    ----------------------------     -----------------------------         PCT >= 0.50 ng/mL              PCT 0.26 - 0.50 ng/mL  AND        <80% decrease in PCT             Encourage initiation of                                             antibiotics       Encourage continuation           of antibiotics    ----------------------------     -----------------------------        PCT >= 0.50 ng/mL                  PCT > 0.50 ng/mL               AND         increase in PCT                  Strongly encourage                                      initiation of antibiotics    Strongly encourage escalation           of antibiotics                                     -----------------------------                                           PCT <= 0.25 ng/mL                                                 OR                                        > 80% decrease in PCT                                     Discontinue / Do not initiate                                             antibiotics Performed at [facility], [place] [address], [place], [place] [address]   Lactate dehydrogenase     Status: Abnormal   Collection Time: 10/29/18  1:40 PM  Result Value Ref Range   LDH 207 (H) 98 - 192 U/L    Comment: Performed at [facility], [place] [address], [place],  [address]  Ferritin     Status: None   Collection Time: 10/29/18  1:40 PM  Result Value Ref Range   Ferritin 121 11 - 307 ng/mL    Comment: Performed at [facility], [address] [place], [place] [address]  Fibrinogen     Status: Abnormal   Collection Time: 10/29/18  1:40  PM  Result Value Ref Range   Fibrinogen 628 (H) 210 - 475 mg/dL    Comment: Performed at [facility] [address] [place], [place] [address]  C-reactive protein     Status: Abnormal   Collection Time: 10/29/18  1:40 PM  Result Value Ref Range   CRP 8.8 (H) <1.0 mg/dL    Comment: Performed at [facility] [address], [place], [place] [address]  Triglycerides     Status: Abnormal   Collection Time: 10/29/18  1:40 PM  Result Value Ref Range   Triglycerides 295 (H) <150 mg/dL    Comment: Performed at [facility] [address], [place], [place] [address]   Dg Chest Port 1 View  Result Date: 10/29/2018 CLINICAL DATA:  Short of breath EXAM: PORTABLE CHEST 1 VIEW COMPARISON:  10/24/2018 FINDINGS: Normal heart size. Lungs are under aerated. Hazy left perihilar pulmonary opacities. No pneumothorax or pleural effusion. IMPRESSION: Hazy left perihilar airspace opacities. Followup PA and lateral chest X-ray is recommended in 3-4 weeks following trial of antibiotic therapy to ensure resolution and exclude underlying malignancy. Electronically Signed   By: Marybelle Killings M.D.   On: 10/29/2018 13:47    Pending Labs Unresulted Labs (From admission, onward)    Start     Ordered   10/29/18 1244  Lactic acid, plasma  Now then every 2 hours,   STAT     10/29/18 1243   10/29/18 1244  Blood Culture (routine x 2)  BLOOD CULTURE X 2,   STAT     10/29/18 1243   10/29/18 1244  Urinalysis, Routine w reflex microscopic  ONCE - STAT,   STAT     10/29/18 1243          Vitals/Pain Today's Vitals   10/29/18 1500 10/29/18 1526 10/29/18 1526 10/29/18 1530  BP: (!) 96/52   (!) 100/34  Pulse: 81   82  Resp: 16   17  Temp:      TempSrc:      SpO2: 99%   98%  PainSc:  0-No pain 0-No pain     Isolation Precautions No active isolations  Medications Medications  ceFEPIme (MAXIPIME) 1 g in sodium chloride 0.9 % 100 mL IVPB (1 g Intravenous New Bag/Given 10/29/18 1523)  vancomycin  (VANCOCIN) IVPB 1000 mg/200 mL premix (0 mg Intravenous Stopped 10/29/18 1516)  sodium chloride 0.9 % bolus 500 mL (500 mLs Intravenous New Bag/Given 10/29/18 1523)    Mobility walks with person assist     Focused Assessments Respiratory   R Recommendations: See Admitting Provider Note  Report given to:   Additional Notes:

## 2018-10-29 NOTE — ED Notes (Signed)
Portable xray at bedside.

## 2018-10-29 NOTE — ED Triage Notes (Signed)
Pt was d/c'd from [facility] on 4/22, sent by MD for possible blood clot

## 2018-10-29 NOTE — Progress Notes (Addendum)
Pharmacy Antibiotic Note  MADELIENE [name] is a 85 y.o. female admitted on 10/29/2018 with pneumonia.  Pharmacy has been consulted for cefepime/vanc dosing.  Presented with SOB and cough - was recently admitted with hip fx. WBC 16.7, PCT 0.1, LA 1.3, CRP 8.8. Afebrile here but reported fever at urgent care center. CXR showing hazy L perihilar airspace opacities. COVID test neg. Received 1 g IV cefepime/vanc on 4/25. Scr 1.54 (CrCl 25 mL/min) - in AKI from Scr 0.8 on 4/22.  Plan:  Cefepime 2 g IV every 24 hours  Vancomycin 1g IV every 48 hours Monitor renal fx, clinical pic, cx results, and vanc levels as indicated    Temp (24hrs), Avg:98.2 F (36.8 C), Min:98.2 F (36.8 C), Max:98.2 F (36.8 C)  Recent Labs  Lab 10/24/18 1546 10/24/18 1900 10/25/18 0400 10/26/18 0425 10/29/18 1340  WBC 11.2* 12.3* 8.7 9.2 16.7*  CREATININE 1.23* 1.16* 0.96 0.80 1.54*  LATICACIDVEN  --   --   --   --  1.3    Estimated Creatinine Clearance: 24.8 mL/min (A) (by C-G formula based on SCr of 1.54 mg/dL (H)).    No Known Allergies  Antimicrobials this admission: Vanc 4/25 >>  Cefepime 4/25 >>   Dose adjustments this admission: N/A  Microbiology results: 4/25 BCx: sent 4/25 COVID: neg, resent   Thank you for allowing pharmacy to be a part of this patient's care.  Antonietta Jewel, PharmD, [place] Clinical Pharmacist  Pager: [phone] Phone: [phone] 10/29/2018 5:26 PM

## 2018-10-29 NOTE — ED Notes (Addendum)
Assisted pt to use personal mobile phone to call family (daughter, Lori-jean), assisted pt to update daughter (that pt had been seen by ED MD, seen by RN, EKG performed, waiting from blood draw and xray)

## 2018-10-29 NOTE — H&P (Addendum)
History and Physical    NETA UPADHYAY FMB:[id number] DOB: Nov 03, 1932 DOA: 10/29/2018  PCP: Kathyrn Lass, MD Consultants:  Lindi Adie - oncology; Nelva Bush - orthopedics; Ardis Hughs - GI Patient coming from: Home - lives with husband; NOK: Daughter, [phone]  Chief Complaint: SOB  HPI: Brandy [name] is a 85 y.o. female with medical history significant of HTN; HLD; mild dementia; mild-moderate AS; and breast CA presenting with SOB.  She was admitted with a hip fracture from 4/20-22 and discharged home with daughter.  She has done extremely well - getting up, walking.  Last night, she had indigestion and her daughter gave omeprazole.  She had some cough overnight but nothing dramatic.  This AM, she appeared to have a wheeze or rattle in her chest with breathing.  She had a h/o PNA in rehab and so her daughter was worried about PNA again.  She did not have fever at home.  The family took her to urgent care and she did have a fever there.  She did appear to be SOB with ambulation, had to stop to breathe.  No sick contacts.  ED Course:  Presented with fever (101.8), SOB, sats to 80s - placed on [place].  Soft BP (100/44) , giving gentle IVF hydration.  WBC 16, Hgb 9, CXR with right-sided PNA, creatinine to 1.9.  High fibrinogen, D-dimer, LDH, normal lactate.  COVID negative.  Review of Systems: As per HPI; otherwise review of systems reviewed and negative.  This was somewhat hindered by mildly AMS.    Past Medical History:  Diagnosis Date  . Anemia   . Arthritis    shoulders  . Asthma    mild, no inhalers used  . Breast cancer ([place]) 12/18/11   left breast masectomy=metastatic ca in (1/1) lymph node ,invasive ductal ca,2 foci,,dcis,lymph ovascular invasion identified,surgical resection margins neg for ca,additional tissue=benign skin and subcutaneous tissue  . Bronchitis    hx of;last time >40yr ago  . Depression    takes Celexa daily  . Difficult intubation   . Dysphagia   . Full dentures   . Gall  stones 2015  . GERD (gastroesophageal reflux disease)    takes Prilosec prn  . H/O hiatal hernia   . History of kidney stones    many yrs ago  . Hx of radiation therapy 04/26/12 -06/10/12   left breast  . Hyperlipidemia    takes Simvastatin daily  . Hypertension    takes Amlodipine and Diovan daily  . Insomnia    takes Ambien nightly  . Urinary urgency     Past Surgical History:  Procedure Laterality Date  . APPENDECTOMY    . bladder tack    . BREAST BIOPSY  1998   left   . DILATION AND CURETTAGE OF UTERUS    . ENDOSCOPIC RETROGRADE CHOLANGIOPANCREATOGRAPHY (ERCP) WITH PROPOFOL N/A 02/01/2014   Procedure: ENDOSCOPIC RETROGRADE CHOLANGIOPANCREATOGRAPHY (ERCP) WITH PROPOFOL;  Surgeon: [name], MD;  Location: [facility];  Service: Endoscopy;  Laterality: N/A;  . ESOPHAGOGASTRODUODENOSCOPY    . ESOPHAGOGASTRODUODENOSCOPY N/A 06/26/2016   Procedure: ESOPHAGOGASTRODUODENOSCOPY (EGD);  Surgeon: [name], MD;  Location: [facility];  Service: Endoscopy;  Laterality: N/A;  . ESOPHAGOGASTRODUODENOSCOPY (EGD) WITH PROPOFOL  12/19/2013   Procedure: ESOPHAGOGASTRODUODENOSCOPY (EGD) WITH PROPOFOL;  Surgeon: Beryle Beams, MD;  Location: [facility];  Service: Endoscopy;;  . ESOPHAGOGASTRODUODENOSCOPY (EGD) WITH PROPOFOL N/A 09/10/2016   Procedure: ESOPHAGOGASTRODUODENOSCOPY (EGD) WITH PROPOFOL;  Surgeon: [name], MD;  [facility];  Service:  Endoscopy;  Laterality: N/A;  . EUS  06/04/2011   Procedure: UPPER ENDOSCOPIC ULTRASOUND (EUS) LINEAR;  Surgeon: Owens Loffler, MD;  [facility];  Service: Endoscopy;  Laterality: N/A;  . EUS N/A 02/01/2014   Procedure: UPPER ENDOSCOPIC ULTRASOUND (EUS) LINEAR;  Surgeon: [name], MD;  [facility];  Service: Endoscopy;  Laterality: N/A;  . EXPLORATORY LAPAROTOMY      biopsy of intra-abdominal mass  . INTRAMEDULLARY (IM) NAIL INTERTROCHANTERIC Right 10/24/2018   Procedure: INTRAMEDULLARY (IM)  NAIL INTERTROCHANTRIC;  Surgeon: Gaynelle Arabian, MD;  Location: [facility];  Service: Orthopedics;  Laterality: Right;  . LAPAROTOMY N/A 11/17/2016   Procedure: EXPLORATORY LAPAROTOMY WITH LYSIS OF ADHESIONS, SMALL BOWEL RESECTION, REPAIR OF INCARCERATED VENTRAL INCISIONAL HERNIA, INSERTION OF BIOLOGIC MESH PATCH,APPLICATION OF WOUND VAC DRESSING;  Surgeon: Armandina Gemma, MD;  Location: [facility];  Service: General;  Laterality: N/A;  . MASTECTOMY W/ SENTINEL NODE BIOPSY  12/18/2011   Procedure: MASTECTOMY WITH SENTINEL LYMPH NODE BIOPSY;  Surgeon: Rolm Bookbinder, MD;  Location: [facility];  Service: General;  Laterality: Left;  . PORT-A-CATH REMOVAL Right 06/15/2013   Procedure: REMOVAL PORT-A-CATH;  Surgeon: Rolm Bookbinder, MD;  Location: [facility];  Service: General;  Laterality: Right;  . PORTACATH PLACEMENT  01/27/2012   Procedure: INSERTION PORT-A-CATH;  Surgeon: Rolm Bookbinder, MD;  Location: [facility];  Service: General;  Laterality: Right;  PORT PLACEMENT  . TOTAL SHOULDER REPLACEMENT  2011   left  . TUBAL LIGATION      Social History   Socioeconomic History  . Marital status: Married    Spouse name: Not on file  . Number of children: Not on file  . Years of education: Not on file  . Highest education level: Not on file  Occupational History  . Occupation: retired  Scientific laboratory technician  . Financial resource strain: Not on file  . Food insecurity:    Worry: Not on file    Inability: Not on file  . Transportation needs:    Medical: Not on file    Non-medical: Not on file  Tobacco Use  . Smoking status: Never Smoker  . Smokeless tobacco: Never Used  Substance and Sexual Activity  . Alcohol use: No  . Drug use: No  . Sexual activity: Yes    Birth control/protection: Surgical    Comment: mensus age 59, 38st pregnancy 31, no hrt gg4,p3, 1 babay lived a few hours complications  Lifestyle  . Physical activity:    Days per week: Not on file    Minutes per  session: Not on file  . Stress: Not on file  Relationships  . Social connections:    Talks on phone: Not on file    Gets together: Not on file    Attends religious service: Not on file    Active member of club or organization: Not on file    Attends meetings of clubs or organizations: Not on file    Relationship status: Not on file  . Intimate partner violence:    Fear of current or ex partner: Not on file    Emotionally abused: Not on file    Physically abused: Not on file    Forced sexual activity: Not on file  Other Topics Concern  . Not on file  Social History Narrative  . Not on file    No Known Allergies  Family History  Problem Relation Age of Onset  . Prostate cancer Father   . Breast  cancer Other     Prior to Admission medications   Medication Sig Start Date End Date Taking? Authorizing Provider  acetaminophen (TYLENOL) 500 MG tablet Take 1,000 mg by mouth every 6 (six) hours as needed (pain).    [provider]  amLODipine (NORVASC) 10 MG tablet Take 10 mg by mouth daily. 09/18/18   [provider]  aspirin EC 325 MG tablet Take 1 tablet (325 mg total) by mouth 2 (two) times daily for 21 days. Then take one 81 mg aspirin once a day for three weeks. Then discontinue aspirin. 10/25/18 11/15/18  Edmisten, Ok Anis, PA  atorvastatin (LIPITOR) 40 MG tablet Take 40 mg by mouth daily.  04/27/16   [provider]  citalopram (CELEXA) 40 MG tablet Take 1 tablet by mouth daily. 06/01/16   [provider]  Cyanocobalamin (VITAMIN B-12 PO) Take 1 tablet by mouth daily.    [provider]  docusate sodium (COLACE) 100 MG capsule Take 1 capsule (100 mg total) by mouth daily. 10/26/18   Rai, Ripudeep Raliegh Ip, MD  ferrous sulfate 325 (65 FE) MG tablet Take 325 mg by mouth daily with breakfast.    [provider]  gabapentin (NEURONTIN) 300 MG capsule Take 1 capsule (300 mg total) by mouth at bedtime. 10/26/18   Rai, Vernelle Emerald, MD   HYDROcodone-acetaminophen (NORCO/VICODIN) 5-325 MG tablet Take 1 tablet by mouth every 6 (six) hours as needed for severe pain. 10/25/18   Edmisten, Ok Anis, PA  methocarbamol (ROBAXIN) 500 MG tablet Take 1 tablet (500 mg total) by mouth every 6 (six) hours as needed for muscle spasms. 10/25/18   Edmisten, Ok Anis, PA  Multiple Vitamin (MULTIVITAMIN WITH MINERALS) TABS tablet Take 1 tablet by mouth daily.    [provider]  omeprazole (PRILOSEC) 40 MG capsule TAKE 1 CAPSULE (40 MG TOTAL) BY MOUTH 2 (TWO) TIMES DAILY BEFORE A MEAL. 05/20/18   [name], MD  spironolactone (ALDACTONE) 25 MG tablet Take 0.5 tablets (12.5 mg total) by mouth daily. Please make appt for future refills FINAL ATTEMPT Patient taking differently: Take 12.5 mg by mouth daily.  08/29/18   Dorothy Spark, MD  traMADol (ULTRAM) 50 MG tablet Take 100 mg by mouth 4 (four) times daily as needed for pain. 10/08/18   [provider]  valsartan-hydrochlorothiazide (DIOVAN-HCT) 320-25 MG tablet Take 1 tablet by mouth daily. 08/27/18   [provider]  Vitamin D, Ergocalciferol, (DRISDOL) 50000 units CAPS capsule Take 50,000 Units by mouth every Thursday.     [provider]    Physical Exam: Vitals:   10/29/18 1500 10/29/18 1530 10/29/18 1545 10/29/18 1600  BP: (!) 96/52 (!) 100/34 (!) 96/48 (!) 109/44  Pulse: 81 82 81 76  Resp: 16 17 18 20   Temp:      TempSrc:      SpO2: 99% 98% 100% 100%     . General:  Appears calm but mildly confused, in NAD . Eyes:  PERRL, EOMI, normal lids, iris . ENT:  grossly normal hearing, lips & tongue, mmm; edentulous . Neck:  no LAD, masses or thyromegaly . Cardiovascular:  RRR, no r/g, 3/6 systolic murmur. No LE edema.  Marland Kitchen Respiratory:   CTA bilaterally with no wheezes/rales/rhonchi.  Normal respiratory effort. . Abdomen:  soft, NT, ND, NABS . Skin:  no rash or induration seen on limited exam . Musculoskeletal:  grossly normal tone BUE/BLE, good  ROM, no bony abnormality . Lower extremity:  No LE  edema.  Limited foot exam with no ulcerations.  2+ distal pulses. Marland Kitchen Psychiatric:  grossly normal mood and affect, speech fluent and appropriate, AOx2 . Neurologic:  CN 2-12 grossly intact, moves all extremities in coordinated fashion, sensation intact    Radiological Exams on Admission: Dg Chest Port 1 View  Result Date: 10/29/2018 CLINICAL DATA:  Short of breath EXAM: PORTABLE CHEST 1 VIEW COMPARISON:  10/24/2018 FINDINGS: Normal heart size. Lungs are under aerated. Hazy left perihilar pulmonary opacities. No pneumothorax or pleural effusion. IMPRESSION: Hazy left perihilar airspace opacities. Followup PA and lateral chest X-ray is recommended in 3-4 weeks following trial of antibiotic therapy to ensure resolution and exclude underlying malignancy. Electronically Signed   By: Marybelle Killings M.D.   On: 10/29/2018 13:47    EKG: Independently reviewed.  NSR with rate 94; LVH; nonspecific ST changes with no evidence of acute ischemia; NSCSLT   Labs on Admission: I have personally reviewed the available labs and imaging studies at the time of the admission.  Pertinent labs:   Glucose 141 BUN 56/Creatinine 1.54/GFR 31; 20/0.80/>60 on 4/22 LDH 207 Ferritin 121 CRP 8.8 Lactate 1.3 Procalcitonin 0.10 WBC 16.7 Hgb 9.0; 10.1 on 4/20 D-dimer 3.90 Fibrinogen 628   Assessment/Plan Principal Problem:   Acute respiratory failure with hypoxia (HCC) Active Problems:   Hyperlipidemia   Essential hypertension   Acute renal failure (ARF) (HCC)   S/P right hip fracture   Acute respiratory failure with hypoxia -Patient with presenting with SOB and fever; also with cough  -She was recently hospitalized for hip fracture, and so in addition to COVID the ddx includes PE and HCAP -No known COVID contacts -She does not have a usual home O2 requirement and is currently requiring 2L [place] O2 -The patient has comorbidities which may increase the risk for  ARDS/MODS including: age, HTN, asthma -Pertinent labs concerning for COVID include increased BUN/Creatinine; low procalcitonin; markedly elevated D-dimer (>1); markedly elevated CRP (>7); elevated troponin; increased LDH -CXR with multifocal opacities which may be c/w COVID vs. Multifocal PNA; CTA chest pending-Will treat with broad-spectrum antibiotics despite procalcitonin 0.10; will continue Cefepime and Vanc -Will admit for further evaluation, close monitoring, and treatment -Monitor on telemetry x at least 24 hours -At this time, will attempt to avoid use of aerosolized medications and use HFAs instead -Will check daily labs including BMP with Mag, Phos; LFTs; CBC with differential; and CRP (q12h);  fibrinogen; D-dimer (q12h); procalcitonin -Continue supplemental O2 for now -Will not currently treat for COVID infection given initial negative test, but will send an additional test out to [facility] for confirmatory testing -Given high suspicion for disease, she will need to remain on droplet/contact precautions at this time -Will attempt to maintain euvolemia to a net negative fluid status; however, given her acute renal failure, she will need IVF at least overnight -With D-dimer <5, will use standard-dosed Lovenox for DVT prevention unless PE is present on CTA -Patient was seen wearing full PPE including: gown, gloves, head cover, N95, and face shield -Needs f/u CT in about 3 months to r/o underlying malignancy  Acute renal failure -Patient with prior normal creatinine, now increased >0.5 from baseline -Her daughter reports that she was eating and drinking well at home -Will give gentle IVF hydration overnight -As noted above, this is somewhat concerning for COVID infection  HTN -Soft BP for now, will hold medications (Valsartan, HCTZ, amlodipine, spironolactone)  Recent hip fracture -s/p intramedullary nailing on 4/20 -Her daughter reports that she was ambulating  and doing well at home  -Will need PT consultation once medically stable  AS -2018 echo showed mild-moderate stenosis -Needs outpatient f/u -Likely unrelated to presenting complaint      DVT prophylaxis:  Lovenox  Code Status:  DNR - confirmed with daughter Family Communication: None present; I called and spoke with her daughter by telephone Disposition Plan:  Home once clinically improved Consults called: None  Admission status: Admit - It is my clinical opinion that admission to INPATIENT is reasonable and necessary because of the expectation that this patient will require hospital care that crosses at least 2 midnights to treat this condition based on the medical complexity of the problems presented.  Given the aforementioned information, the predictability of an adverse outcome is felt to be significant.     Karmen Bongo MD [facility]   How to contact the [facility] Attending or Consulting provider [place] or covering provider during after hours [place], for this patient?  1. Check the care team in [facility] and look for a) attending/consulting [facility] provider listed and b) the [facility] team listed 2. Log into [email or web address] and use [place] universal password to access. If you do not have the password, please contact the hospital operator. 3. Locate the [facility] provider you are looking for under [facility] and page to a number that you can be directly reached. 4. If you still have difficulty reaching the provider, please page the [facility] (Director on Call) for the Hospitalists listed on amion for assistance.   10/29/2018, 5:11 PM

## 2018-10-29 NOTE — ED Provider Notes (Signed)
[facility] EMERGENCY DEPARTMENT Provider Note   CSN: [id number] Arrival date & time: 10/29/18  1217    History   Chief Complaint Chief Complaint  Patient presents with  . Shortness of Breath    HPI Brandy [name] is a 85 y.o. female.     Patient is a 85 year old female who presents with shortness of breath and hypoxia.  She had hip surgery after hip fracture on April 22.  Was discharged to home.  She was brought by her daughter to [facility] urgent care this morning because she was having cough and shortness of breath.  She was noted to have some mild hypoxia at 87%.  She was also noted to have a fever of 101.8.  She was sent here for further evaluation.     Past Medical History:  Diagnosis Date  . Anemia   . Arthritis    shoulders  . Asthma    mild, no inhalers used  . Breast cancer ([place]) 12/18/11   left breast masectomy=metastatic ca in (1/1) lymph node ,invasive ductal ca,2 foci,,dcis,lymph ovascular invasion identified,surgical resection margins neg for ca,additional tissue=benign skin and subcutaneous tissue  . Bronchitis    hx of;last time >98yr ago  . Depression    takes Celexa daily  . Difficult intubation   . Dysphagia   . Full dentures   . Gall stones 2015  . GERD (gastroesophageal reflux disease)    takes Prilosec prn  . H/O hiatal hernia   . History of kidney stones    many yrs ago  . Hx of radiation therapy 04/26/12 -06/10/12   left breast  . Hyperlipidemia    takes Simvastatin daily  . Hypertension    takes Amlodipine and Diovan daily  . Insomnia    takes Ambien nightly  . Urinary urgency     Patient Active Problem List   Diagnosis Date Noted  . Acute respiratory failure with hypoxia ([place]) 10/29/2018  . S/P right hip fracture 10/24/2018  . Acute on chronic diastolic CHF (congestive heart failure) ([place]) 01/19/2017  . HCAP (healthcare-associated pneumonia) 12/11/2016  . S/P exploratory laparotomy 11/17/2016  . Small bowel  obstruction ([place]) 11/17/2016  . Pre-operative cardiovascular examination   . Incarcerated incisional hernia s/p SB resection & repair 11/17/2016 11/15/2016  . Acute renal failure (ARF) ([place]) 11/15/2016  . Hyperglycemia 11/15/2016  . Asthma without status asthmaticus 09/24/2016  . Carpal tunnel syndrome 09/24/2016  . Asthma 09/24/2016  . Dark stools 09/24/2016  . Degenerative arthritis of lumbar spine 09/24/2016  . DJD (degenerative joint disease), cervical 09/24/2016  . Dyspnea on exertion 09/24/2016  . Generalized anxiety disorder 09/24/2016  . Lumbosacral spondylosis without myelopathy 09/24/2016  . Morbid (severe) obesity due to excess calories ([place]) 09/24/2016  . Pernicious anemia 09/24/2016  . Pure hypercholesterolemia 09/24/2016  . Right knee pain 09/24/2016  . Sleep disorder 09/24/2016  . Vitamin B 12 deficiency 09/24/2016  . Vitamin D deficiency 09/24/2016  . Gastric ulcer   . Iron deficiency anemia due to chronic blood loss   . Abdominal pain, chronic, epigastric   . Acute gastric ulcer without hemorrhage or perforation   . Duodenal ulcer   . Closed fracture of nasal septum 08/27/2015  . Closed fracture of zygomatic arch ([place]) 08/27/2015  . Protein-calorie malnutrition, severe ([place]) 12/18/2013  . Choledocholithiasis 12/17/2013  . Sepsis ([place]) 12/17/2013  . Osteopenia 11/17/2012  . Moderate aortic stenosis 10/17/2012  . Hx of radiation therapy   . Depression   .  History of kidney stones   . Urinary urgency   . Gastroesophageal reflux disease without esophagitis   . Hyperlipidemia   . Essential hypertension   . Bronchitis   . Arthritis   . Dysphagia   . H/O hiatal hernia   . Primary cancer of upper outer quadrant of left female breast ([place]) 12/01/2011    Past Surgical History:  Procedure Laterality Date  . APPENDECTOMY    . bladder tack    . BREAST BIOPSY  1998   left   . DILATION AND CURETTAGE OF UTERUS    . ENDOSCOPIC RETROGRADE CHOLANGIOPANCREATOGRAPHY  (ERCP) WITH PROPOFOL N/A 02/01/2014   Procedure: ENDOSCOPIC RETROGRADE CHOLANGIOPANCREATOGRAPHY (ERCP) WITH PROPOFOL;  Surgeon: [name], MD;  Location: [facility];  Service: Endoscopy;  Laterality: N/A;  . ESOPHAGOGASTRODUODENOSCOPY    . ESOPHAGOGASTRODUODENOSCOPY N/A 06/26/2016   Procedure: ESOPHAGOGASTRODUODENOSCOPY (EGD);  Surgeon: [name], MD;  Location: [facility];  Service: Endoscopy;  Laterality: N/A;  . ESOPHAGOGASTRODUODENOSCOPY (EGD) WITH PROPOFOL  12/19/2013   Procedure: ESOPHAGOGASTRODUODENOSCOPY (EGD) WITH PROPOFOL;  Surgeon: Beryle Beams, MD;  Location: [facility];  Service: Endoscopy;;  . ESOPHAGOGASTRODUODENOSCOPY (EGD) WITH PROPOFOL N/A 09/10/2016   Procedure: ESOPHAGOGASTRODUODENOSCOPY (EGD) WITH PROPOFOL;  Surgeon: [name], MD;  [facility];  Service: Endoscopy;  Laterality: N/A;  . EUS  06/04/2011   Procedure: UPPER ENDOSCOPIC ULTRASOUND (EUS) LINEAR;  Surgeon: Owens Loffler, MD;  Location: [facility];  Service: Endoscopy;  Laterality: N/A;  . EUS N/A 02/01/2014   Procedure: UPPER ENDOSCOPIC ULTRASOUND (EUS) LINEAR;  Surgeon: [name], MD;  [facility];  Service: Endoscopy;  Laterality: N/A;  . EXPLORATORY LAPAROTOMY      biopsy of intra-abdominal mass  . INTRAMEDULLARY (IM) NAIL INTERTROCHANTERIC Right 10/24/2018   Procedure: INTRAMEDULLARY (IM) NAIL INTERTROCHANTRIC;  Surgeon: Gaynelle Arabian, MD;  Location: [facility];  Service: Orthopedics;  Laterality: Right;  . LAPAROTOMY N/A 11/17/2016   Procedure: EXPLORATORY LAPAROTOMY WITH LYSIS OF ADHESIONS, SMALL BOWEL RESECTION, REPAIR OF INCARCERATED VENTRAL INCISIONAL HERNIA, INSERTION OF BIOLOGIC MESH PATCH,APPLICATION OF WOUND VAC DRESSING;  Surgeon: Armandina Gemma, MD;  Location: [facility];  Service: General;  Laterality: N/A;  . MASTECTOMY W/ SENTINEL NODE BIOPSY  12/18/2011   Procedure: MASTECTOMY WITH SENTINEL LYMPH NODE BIOPSY;  Surgeon: Rolm Bookbinder, MD;  Location: [facility];  Service: General;  Laterality: Left;  . PORT-A-CATH REMOVAL Right 06/15/2013   Procedure: REMOVAL PORT-A-CATH;  Surgeon: Rolm Bookbinder, MD;  Location: [facility] [place];  Service: General;  Laterality: Right;  . PORTACATH PLACEMENT  01/27/2012   Procedure: INSERTION PORT-A-CATH;  Surgeon: Rolm Bookbinder, MD;  Location: [facility];  Service: General;  Laterality: Right;  PORT PLACEMENT  . TOTAL SHOULDER REPLACEMENT  2011   left  . TUBAL LIGATION       OB History   No obstetric history on file.      Home Medications    Prior to Admission medications   Medication Sig Start Date End Date Taking? Authorizing Provider  acetaminophen (TYLENOL) 500 MG tablet Take 1,000 mg by mouth every 6 (six) hours as needed (pain).    [provider]  amLODipine (NORVASC) 10 MG tablet Take 10 mg by mouth daily. 09/18/18   [provider]  aspirin EC 325 MG tablet Take 1 tablet (325 mg total) by mouth 2 (two) times daily for 21 days. Then take one 81 mg aspirin once a day for three weeks. Then discontinue aspirin. 10/25/18 11/15/18  Edmisten, Kristie L, PA  atorvastatin (LIPITOR) 40 MG tablet Take 40 mg by mouth daily.  04/27/16   [provider]  citalopram (CELEXA) 40 MG tablet Take 1 tablet by mouth daily. 06/01/16   [provider]  Cyanocobalamin (VITAMIN B-12 PO) Take 1 tablet by mouth daily.    [provider]  docusate sodium (COLACE) 100 MG capsule Take 1 capsule (100 mg total) by mouth daily. 10/26/18   Rai, Ripudeep Raliegh Ip, MD  ferrous sulfate 325 (65 FE) MG tablet Take 325 mg by mouth daily with breakfast.    [provider]  gabapentin (NEURONTIN) 300 MG capsule Take 1 capsule (300 mg total) by mouth at bedtime. 10/26/18   Rai, Vernelle Emerald, MD  HYDROcodone-acetaminophen (NORCO/VICODIN) 5-325 MG tablet Take 1 tablet by mouth every 6 (six) hours as needed for severe pain. 10/25/18   Edmisten, Ok Anis, PA  methocarbamol  (ROBAXIN) 500 MG tablet Take 1 tablet (500 mg total) by mouth every 6 (six) hours as needed for muscle spasms. 10/25/18   Edmisten, Ok Anis, PA  Multiple Vitamin (MULTIVITAMIN WITH MINERALS) TABS tablet Take 1 tablet by mouth daily.    [provider]  omeprazole (PRILOSEC) 40 MG capsule TAKE 1 CAPSULE (40 MG TOTAL) BY MOUTH 2 (TWO) TIMES DAILY BEFORE A MEAL. 05/20/18   [name], MD  spironolactone (ALDACTONE) 25 MG tablet Take 0.5 tablets (12.5 mg total) by mouth daily. Please make appt for future refills FINAL ATTEMPT Patient taking differently: Take 12.5 mg by mouth daily.  08/29/18   Dorothy Spark, MD  traMADol (ULTRAM) 50 MG tablet Take 100 mg by mouth 4 (four) times daily as needed for pain. 10/08/18   [provider]  valsartan-hydrochlorothiazide (DIOVAN-HCT) 320-25 MG tablet Take 1 tablet by mouth daily. 08/27/18   [provider]  Vitamin D, Ergocalciferol, (DRISDOL) 50000 units CAPS capsule Take 50,000 Units by mouth every Thursday.     [provider]    Family History Family History  Problem Relation Age of Onset  . Prostate cancer Father   . Breast cancer Other     Social History Social History   Tobacco Use  . Smoking status: Never Smoker  . Smokeless tobacco: Never Used  Substance Use Topics  . Alcohol use: No  . Drug use: No     Allergies   Patient has no known allergies.   Review of Systems Review of Systems  Unable to perform ROS: Dementia     Physical Exam Updated Vital Signs BP (!) 96/52   Pulse 81   Temp 98.2 F (36.8 C) (Oral)   Resp 16   SpO2 99%   Physical Exam Constitutional:      Appearance: She is well-developed.  HENT:     Head: Normocephalic and atraumatic.  Eyes:     Pupils: Pupils are equal, round, and reactive to light.  Neck:     Musculoskeletal: Normal range of motion and neck supple.  Cardiovascular:     Rate and Rhythm: Normal rate and regular rhythm.     Heart sounds: Normal  heart sounds.  Pulmonary:     Effort: Pulmonary effort is normal. No respiratory distress.     Breath sounds: Normal breath sounds. No wheezing or rales.  Chest:     Chest wall: No tenderness.  Abdominal:     General: Bowel sounds are normal.     Palpations: Abdomen is soft.     Tenderness: There is no abdominal  tenderness. There is no guarding or rebound.  Musculoskeletal: Normal range of motion.     Comments: Incision looks ok  Lymphadenopathy:     Cervical: No cervical adenopathy.  Skin:    General: Skin is warm and dry.     Findings: No rash.  Neurological:     General: No focal deficit present.     Mental Status: She is alert.     Comments: Oriented to person, place       ED Treatments / Results  Labs (all labs ordered are listed, but only abnormal results are displayed) Labs Reviewed  CBC WITH DIFFERENTIAL/PLATELET - Abnormal; Notable for the following components:      Result Value   WBC 16.7 (*)    RBC 2.99 (*)    Hemoglobin 9.0 (*)    HCT 30.1 (*)    MCV 100.7 (*)    MCHC 29.9 (*)    Neutro Abs 13.1 (*)    Monocytes Absolute 1.1 (*)    Abs Immature Granulocytes 0.10 (*)    All other components within normal limits  COMPREHENSIVE METABOLIC PANEL - Abnormal; Notable for the following components:   Glucose, Bld 141 (*)    BUN 56 (*)    Creatinine, Ser 1.54 (*)    Total Protein 6.4 (*)    Albumin 2.9 (*)    GFR calc non Af Amer 31 (*)    GFR calc Af Amer 36 (*)    All other components within normal limits  D-DIMER, QUANTITATIVE (NOT AT [facility]) - Abnormal; Notable for the following components:   D-Dimer, Quant 3.90 (*)    All other components within normal limits  LACTATE DEHYDROGENASE - Abnormal; Notable for the following components:   LDH 207 (*)    All other components within normal limits  FIBRINOGEN - Abnormal; Notable for the following components:   Fibrinogen 628 (*)    All other components within normal limits  C-REACTIVE PROTEIN - Abnormal; Notable  for the following components:   CRP 8.8 (*)    All other components within normal limits  TRIGLYCERIDES - Abnormal; Notable for the following components:   Triglycerides 295 (*)    All other components within normal limits  SARS CORONAVIRUS 2 (HOSPITAL ORDER, PERFORMED [facility] LAB)  CULTURE, BLOOD (ROUTINE X 2)  CULTURE, BLOOD (ROUTINE X 2)  LACTIC ACID, PLASMA  PROCALCITONIN  FERRITIN  LACTIC ACID, PLASMA  URINALYSIS, ROUTINE W REFLEX MICROSCOPIC    EKG EKG Interpretation  Date/Time:  Saturday October 29 2018 12:32:04 EDT Ventricular Rate:  94 PR Interval:    QRS Duration: 103 QT Interval:  351 QTC Calculation: 439 R Axis:   65 Text Interpretation:  Sinus rhythm Probable LVH with secondary repol abnrm since last tracing no significant change Confirmed by Malvin Johns [id number]) on 10/29/2018 1:10:03 PM   Radiology Dg Chest Port 1 View  Result Date: 10/29/2018 CLINICAL DATA:  Short of breath EXAM: PORTABLE CHEST 1 VIEW COMPARISON:  10/24/2018 FINDINGS: Normal heart size. Lungs are under aerated. Hazy left perihilar pulmonary opacities. No pneumothorax or pleural effusion. IMPRESSION: Hazy left perihilar airspace opacities. Followup PA and lateral chest X-ray is recommended in 3-4 weeks following trial of antibiotic therapy to ensure resolution and exclude underlying malignancy. Electronically Signed   By: Marybelle Killings M.D.   On: 10/29/2018 13:47    Procedures Procedures (including critical care time)  Medications Ordered in ED Medications  ceFEPIme (MAXIPIME) 1 g in sodium chloride 0.9 %  100 mL IVPB (1 g Intravenous New Bag/Given 10/29/18 1523)  vancomycin (VANCOCIN) IVPB 1000 mg/200 mL premix (0 mg Intravenous Stopped 10/29/18 1516)  sodium chloride 0.9 % bolus 500 mL (500 mLs Intravenous New Bag/Given 10/29/18 1523)     Initial Impression / Assessment and Plan / ED Course  I have reviewed the triage vital signs and the nursing notes.  Pertinent labs &  imaging results that were available during my care of the patient were reviewed by me and considered in my medical decision making (see chart for details).        Patient is 85 year old female who had a recent hip replacement who presents with fever cough and shortness of breath.  She has some mild hypoxia with oxygen saturations in the upper 80s.  Her blood pressure is borderline with systolic blood pressures in the 80s to low 100s.  She was given IV fluids cautiously.  Her chest x-ray shows patchy infiltrates in the left chest consistent with pneumonia.  She was given antibiotic coverage of cefepime and vancomycin.  Her lactate is negative.  Her d-dimer is elevated as well his other COVID markers however her COVID test has come back negative.  I spoke with Dr. Lorin Mercy who will admit the patient for further treatment.  Given that her d-dimer is elevated and his COVID negative, I have added on a CT angios of the chest to rule out PE.  Final Clinical Impressions(s) / ED Diagnoses   Final diagnoses:  HCAP (healthcare-associated pneumonia)    ED Discharge Orders    None       Malvin Johns, MD 10/29/18 1531

## 2018-10-29 NOTE — ED Notes (Signed)
Handoff given to [facility]

## 2018-10-30 LAB — COMPREHENSIVE METABOLIC PANEL
ALT: 15 U/L (ref 0–44)
AST: 14 U/L — ABNORMAL LOW (ref 15–41)
Albumin: 2.4 g/dL — ABNORMAL LOW (ref 3.5–5.0)
Alkaline Phosphatase: 65 U/L (ref 38–126)
Anion gap: 8 (ref 5–15)
BUN: 52 mg/dL — ABNORMAL HIGH (ref 8–23)
CO2: 23 mmol/L (ref 22–32)
Calcium: 9.1 mg/dL (ref 8.9–10.3)
Chloride: 111 mmol/L (ref 98–111)
Creatinine, Ser: 1.36 mg/dL — ABNORMAL HIGH (ref 0.44–1.00)
GFR calc Af Amer: 42 mL/min — ABNORMAL LOW (ref >60)
GFR calc non Af Amer: 36 mL/min — ABNORMAL LOW (ref >60)
Glucose, Bld: 110 mg/dL — ABNORMAL HIGH (ref 70–99)
Potassium: 3.9 mmol/L (ref 3.5–5.1)
Sodium: 142 mmol/L (ref 135–145)
Total Bilirubin: 0.5 mg/dL (ref 0.3–1.2)
Total Protein: 5.5 g/dL — ABNORMAL LOW (ref 6.5–8.1)

## 2018-10-30 LAB — CBC WITH DIFFERENTIAL/PLATELET
Abs Immature Granulocytes: 0.05 10*3/uL (ref 0.00–0.07)
Basophils Absolute: 0 10*3/uL (ref 0.0–0.1)
Basophils Relative: 0 %
Eosinophils Absolute: 0.3 10*3/uL (ref 0.0–0.5)
Eosinophils Relative: 3 %
HCT: 22.9 % — ABNORMAL LOW (ref 36.0–46.0)
Hemoglobin: 7.1 g/dL — ABNORMAL LOW (ref 12.0–15.0)
Immature Granulocytes: 0 %
Lymphocytes Relative: 15 %
Lymphs Abs: 1.7 10*3/uL (ref 0.7–4.0)
MCH: 30.6 pg (ref 26.0–34.0)
MCHC: 31 g/dL (ref 30.0–36.0)
MCV: 98.7 fL (ref 80.0–100.0)
Monocytes Absolute: 0.8 10*3/uL (ref 0.1–1.0)
Monocytes Relative: 7 %
Neutro Abs: 8.9 10*3/uL — ABNORMAL HIGH (ref 1.7–7.7)
Neutrophils Relative %: 75 %
Platelets: 254 10*3/uL (ref 150–400)
RBC: 2.32 MIL/uL — ABNORMAL LOW (ref 3.87–5.11)
RDW: 14.7 % (ref 11.5–15.5)
WBC: 11.8 10*3/uL — ABNORMAL HIGH (ref 4.0–10.5)
nRBC: 0 % (ref 0.0–0.2)

## 2018-10-30 LAB — NOVEL CORONAVIRUS, NAA (HOSP ORDER, SEND-OUT TO REF LAB; TAT 18-24 HRS): SARS-CoV-2, NAA: NOT DETECTED

## 2018-10-30 LAB — PHOSPHORUS: Phosphorus: 4.2 mg/dL (ref 2.5–4.6)

## 2018-10-30 LAB — FIBRINOGEN: Fibrinogen: 596 mg/dL — ABNORMAL HIGH (ref 210–475)

## 2018-10-30 LAB — D-DIMER, QUANTITATIVE: D-Dimer, Quant: 2.99 ug/mL-FEU — ABNORMAL HIGH (ref 0.00–0.50)

## 2018-10-30 LAB — C-REACTIVE PROTEIN: CRP: 14.5 mg/dL — ABNORMAL HIGH (ref <1.0)

## 2018-10-30 LAB — PROCALCITONIN: Procalcitonin: <0.1 ng/mL

## 2018-10-30 LAB — MAGNESIUM: Magnesium: 2 mg/dL (ref 1.7–2.4)

## 2018-10-30 MED ORDER — ASPIRIN EC 81 MG PO TBEC
81.0000 mg | DELAYED_RELEASE_TABLET | Freq: Every day | ORAL | Status: DC
  Administered 2018-10-30 – 2018-11-01 (×3): 81 mg via ORAL
  Filled 2018-10-30 (×3): qty 1

## 2018-10-30 MED ORDER — PHENOL 1.4 % MT LIQD
1.0000 | OROMUCOSAL | Status: DC | PRN
  Administered 2018-10-31: 1 via OROMUCOSAL
  Filled 2018-10-30: qty 177

## 2018-10-30 MED ORDER — ENOXAPARIN SODIUM 30 MG/0.3ML ~~LOC~~ SOLN
30.0000 mg | SUBCUTANEOUS | Status: DC
  Administered 2018-10-30: 30 mg via SUBCUTANEOUS
  Filled 2018-10-30: qty 0.3

## 2018-10-30 MED ORDER — SODIUM CHLORIDE 0.9 % IV SOLN
INTRAVENOUS | Status: DC | PRN
  Administered 2018-10-30: 250 mL via INTRAVENOUS

## 2018-10-30 NOTE — Progress Notes (Signed)
PROGRESS NOTE    Brandy [name]  XNT:[id number] DOB: [birth year] DOA: 10/29/2018 PCP: Kathyrn Lass, MD    Brief Narrative:    Assessment & Plan: 85 year old with past medical history significant for hypertension, hyperlipidemia, mild dementia, mild to moderate aortic stenosis, breast cancer, who presents complaining of shortness of breath.  Patient was admitted with a hip fracture from 4/20-4/22 and discharged home with daughter.  Patient was doing well.  Over the last couple of days patient started to have worsening cough, wheezing and patient was noted to be short of breath.  At the urgent care patient was found to be febrile.  Oxygen saturation was 80% she was a started on oxygen supplementation.  Initial COVID test negative.   Principal Problem:   Acute respiratory failure with hypoxia (HCC) Active Problems:   Hyperlipidemia   Essential hypertension   Acute renal failure (ARF) (HCC)   S/P right hip fracture   1-Acute hypoxic respiratory failure; secondary to pneumonia -Patient presented with shortness of breath, cough fever. -Is requiring 2 L of oxygen supplementation. -Multiple comorbidity for [place] increased risk of ARDS: Age, hypertension, asthma. Pertinent labs: Triglyceride 295, LDH 207, ferritin 121, CRP 8--- 14.  Procalcitonin less than 0.1, d-dimer 2.9, fibrinogen 596. CT angios:The distal esophagus is prominent. Underlying mass is not excluded. Consider esophagram or upper endoscopy to further characterize. There is an ill-defined patchy opacity in the left upper lobe measuring up to 1.8 cm. There are also patchy opacities at the left base. These findings may reflect an inflammatory process at the lung base and post radiation fibrosis in the left upper lobe. Underlying malignancy is not excluded. Follow-up CT chest in 3 months to ensure resolution. -Send out test for COVID pending.  Rapid COVID 19 test negative -follow labs; CRP, d-dimer.  Acute renal failure; suspect  prerenal' Improved with IV fluids. We will hold on feeder IV fluids. Continue to hold hydrochlorothiazide and losartan.  Bilateral lower extremity edema worse on the right: With elevated d-dimer.  Will check Doppler of lower extremities to rule out DVT.  HTN; continue to hold blood pressure medications: Valsartan, hydrochlorothiazide, amlodipine, spironolactone.  Aortic stenosis: Mild to moderate by echo 2018.  Follow-up as an outpatient.  Recent hip Fracture;  She was on aspirin twice daily for DVT prophylaxis. We will continue while she is in-house with Lovenox. We will continue with PT eval  Esophageal prominent on CT: Patient reports that she has had in the past endoscopy to evaluate for dysphagia. We will order an esophagogram.   Speech evaluation.   Anemia;  No evidence of active bleeding.  Repeat hb in am.  Check anemia panel.   Estimated body mass index is 29.3 kg/m as calculated from the following:   Height as of this encounter: 4\' 11"  (1.499 m).   Weight as of this encounter: 65.8 kg.   DVT prophylaxis: lovenox Code Status: DNR Family Communication: Daughter over phone.  Disposition Plan: home in 2--3 days. Continue with IV antibiotics. Await repeat covid test.   Consultants:   none   Procedures:  Esophagogram.    Antimicrobials:  Cefepime 4-26  Vancomycin 4-26   Subjective: She is still experience cough. SOB stable.   Objective: Vitals:   10/30/18 0346 10/30/18 0350 10/30/18 0421 10/30/18 0724  BP: (!) 98/47 (!) 110/45  (!) 98/42  Pulse: 80   81  Resp: 18   16  Temp: 98 F (36.7 C)   98.6 F (37 C)  TempSrc: Oral  Oral  SpO2: 93%   96%  Weight:   65.8 kg   Height:   4\' 11"  (1.499 m)     Intake/Output Summary (Last 24 hours) at 10/30/2018 0831 Last data filed at 10/30/2018 0700 Gross per 24 hour  Intake 758.43 ml  Output 400 ml  Net 358.43 ml   Filed Weights   10/30/18 0421  Weight: 65.8 kg    Examination:  General exam:  Appears calm and comfortable  Respiratory system: crackles bases.  Cardiovascular system: S1 & S2 heard, RRR. No JVD, murmurs, rubs, gallops or clicks. No pedal edema. Gastrointestinal system: Abdomen is nondistended, soft and nontender. No organomegaly or masses felt. Normal bowel sounds heard. Central nervous system: Alert and oriented. No focal neurological deficits. Extremities: Symmetric 5 x 5 power. Right LE with dressing.  Skin: No rashes, lesions or ulcers Psychiatry: Judgement and insight appear normal. Mood & affect appropriate.     Data Reviewed: I have personally reviewed following labs and imaging studies  CBC: Recent Labs  Lab 10/24/18 1546 10/24/18 1900 10/25/18 0400 10/26/18 0425 10/29/18 1340 10/30/18 0610  WBC 11.2* 12.3* 8.7 9.2 16.7* 11.8*  NEUTROABS 8.1*  --   --   --  13.1* 8.9*  HGB 9.7* 10.1* 8.6* 9.4* 9.0* 7.1*  HCT 31.4* 32.6* 28.4* 30.6* 30.1* 22.9*  MCV 100.3* 100.3* 100.7* 100.3* 100.7* 98.7  PLT 235 232 208 241 327 419   Basic Metabolic Panel: Recent Labs  Lab 10/24/18 1546 10/24/18 1900 10/25/18 0400 10/26/18 0425 10/29/18 1340 10/30/18 0610  NA 140  --  138 140 138 142  K 3.8  --  4.3 3.6 4.2 3.9  CL 108  --  110 108 103 111  CO2 23  --  15* 24 22 23   GLUCOSE 135*  --  172* 109* 141* 110*  BUN 32*  --  25* 20 56* 52*  CREATININE 1.23* 1.16* 0.96 0.80 1.54* 1.36*  CALCIUM 8.8*  --  8.5* 9.0 9.6 9.1  MG  --  2.0  --   --   --  2.0  PHOS  --  4.1  --   --   --  4.2   GFR: Estimated Creatinine Clearance: 25.8 mL/min (A) (by C-G formula based on SCr of 1.36 mg/dL (H)). Liver Function Tests: Recent Labs  Lab 10/29/18 1340 10/30/18 0610  AST 25 14*  ALT 19 15  ALKPHOS 78 65  BILITOT 0.9 0.5  PROT 6.4* 5.5*  ALBUMIN 2.9* 2.4*   No results for input(s): LIPASE, AMYLASE in the last 168 hours. No results for input(s): AMMONIA in the last 168 hours. Coagulation Profile: No results for input(s): INR, PROTIME in the last 168 hours.  Cardiac Enzymes: Recent Labs  Lab 10/24/18 1900 10/24/18 2348  TROPONINI <0.03 <0.03   BNP (last 3 results) No results for input(s): PROBNP in the last 8760 hours. HbA1C: No results for input(s): HGBA1C in the last 72 hours. CBG: No results for input(s): GLUCAP in the last 168 hours. Lipid Profile: Recent Labs    10/29/18 1340  TRIG 295*   Thyroid Function Tests: No results for input(s): TSH, T4TOTAL, FREET4, T3FREE, THYROIDAB in the last 72 hours. Anemia Panel: Recent Labs    10/29/18 1340  FERRITIN 121   Sepsis Labs: Recent Labs  Lab 10/29/18 1340  PROCALCITON 0.10  LATICACIDVEN 1.3    Recent Results (from the past 240 hour(s))  Surgical PCR screen     Status: None   Collection  Time: 10/24/18  7:34 PM  Result Value Ref Range Status   MRSA, PCR NEGATIVE NEGATIVE Final   Staphylococcus aureus NEGATIVE NEGATIVE Final    Comment: (NOTE) The Xpert SA Assay (FDA approved for NASAL specimens in patients 28 years of age and older), is one component of a comprehensive surveillance program. It is not intended to diagnose infection nor to guide or monitor treatment. Performed at [facility], [place] [address] [place], [place] [address]   Blood Culture (routine x 2)     Status: None (Preliminary result)   Collection Time: 10/29/18  1:05 PM  Result Value Ref Range Status   Specimen Description BLOOD LEFT ARM  Final   Special Requests AEROBIC BOTTLE ONLY Blood Culture adequate volume  Final   Culture   Final    NO GROWTH < 24 HOURS Performed at [facility], [place] [address] [place], [place] [address]    Report Status PENDING  Incomplete  SARS Coronavirus 2 [facility] order, Performed in [place] hospital lab)     Status: None   Collection Time: 10/29/18  1:25 PM  Result Value Ref Range Status   SARS Coronavirus 2 NEGATIVE NEGATIVE Final    Comment: (NOTE) If result is NEGATIVE SARS-CoV-2 target nucleic acids are NOT DETECTED. The  SARS-CoV-2 RNA is generally detectable in upper and lower  respiratory specimens during the acute phase of infection. The lowest  concentration of SARS-CoV-2 viral copies this assay can detect is 250  copies / mL. A negative result does not preclude SARS-CoV-2 infection  and should not be used as the sole basis for treatment or other  patient management decisions.  A negative result may occur with  improper specimen collection / handling, submission of specimen other  than nasopharyngeal swab, presence of viral mutation(s) within the  areas targeted by this assay, and inadequate number of viral copies  (<250 copies / mL). A negative result must be combined with clinical  observations, patient history, and epidemiological information. If result is POSITIVE SARS-CoV-2 target nucleic acids are DETECTED. The SARS-CoV-2 RNA is generally detectable in upper and lower  respiratory specimens dur ing the acute phase of infection.  Positive  results are indicative of active infection with SARS-CoV-2.  Clinical  correlation with patient history and other diagnostic information is  necessary to determine patient infection status.  Positive results do  not rule out bacterial infection or co-infection with other viruses. If result is PRESUMPTIVE POSTIVE SARS-CoV-2 nucleic acids MAY BE PRESENT.   A presumptive positive result was obtained on the submitted specimen  and confirmed on repeat testing.  While 2019 novel coronavirus  (SARS-CoV-2) nucleic acids may be present in the submitted sample  additional confirmatory testing may be necessary for epidemiological  and / or clinical management purposes  to differentiate between  SARS-CoV-2 and other Sarbecovirus currently known to infect humans.  If clinically indicated additional testing with an alternate test  methodology [phone]) is advised. The SARS-CoV-2 RNA is generally  detectable in upper and lower respiratory sp ecimens during the acute   phase of infection. The expected result is Negative. Fact Sheet for Patients:  StrictlyIdeas.no Fact Sheet for Healthcare Providers: BankingDealers.co.za This test is not yet approved or cleared by the [place] FDA and has been authorized for detection and/or diagnosis of SARS-CoV-2 by FDA under an Emergency Use Authorization (EUA).  This EUA will remain in effect (meaning this test can be used) for the duration of the COVID-19  declaration under Section 564(b)(1) of the Act, 21 U.S.C. section 360bbb-3(b)(1), unless the authorization is terminated or revoked sooner. Performed at [facility], [place] [address], [place], [place] [address]   Blood Culture (routine x 2)     Status: None (Preliminary result)   Collection Time: 10/29/18  1:30 PM  Result Value Ref Range Status   Specimen Description BLOOD RIGHT ANTECUBITAL  Final   Special Requests   Final    AEROBIC BOTTLE ONLY Blood Culture results may not be optimal due to an inadequate volume of blood received in culture bottles   Culture   Final    NO GROWTH < 24 HOURS Performed at [place] [address] [place], [place] [address]    Report Status PENDING  Incomplete         Radiology Studies: Ct Angio Chest Pe W/cm &/or Wo Cm  Result Date: 10/29/2018 CLINICAL DATA:  Short of breath and hypoxia.  Recent hip surgery. EXAM: CT ANGIOGRAPHY CHEST WITH CONTRAST TECHNIQUE: Multidetector CT imaging of the chest was performed using the standard protocol during bolus administration of intravenous contrast. Multiplanar CT image reconstructions and MIPs were obtained to evaluate the vascular anatomy. CONTRAST:  21mL OMNIPAQUE IOHEXOL 350 MG/ML SOLN COMPARISON:  PET-CT 01/04/2012 FINDINGS: Cardiovascular: There are no filling defects in the pulmonary arterial tree to suggest acute pulmonary thromboembolism. There is no obvious evidence of aortic aneurysm or aortic dissection.  Atherosclerotic vascular calcifications are noted. Mediastinum/Nodes: No abnormal mediastinal adenopathy. No pericardial effusion. Visualized thyroid is unremarkable. The distal esophagus is prominent. On image 98, a large soft tissue density is located in the location of the esophagus. Underlying mass is not excluded. Lungs/Pleura: There is a patchy opacity in the anterior left upper lobe measuring up to 1.8 cm. There are linear opacities extending into the adjacent lung parenchyma. There are lucencies within the patchy the opacity. There is occlusion of small bronchioles in the medial right lung base. There are patchy opacities at the left lung base on images 72 and 76 of series 6. Dependent atelectasis bilaterally. Bilateral lower lobe bronchial wall thickening. Upper Abdomen: No acute abnormality. Musculoskeletal: No vertebral compression deformity. Review of the MIP images confirms the above findings. IMPRESSION: There is no evidence of acute pulmonary thromboembolism The distal esophagus is prominent. Underlying mass is not excluded. Consider esophagram or upper endoscopy to further characterize. There is an ill-defined patchy opacity in the left upper lobe measuring up to 1.8 cm. There are also patchy opacities at the left base. These findings may reflect an inflammatory process at the lung base and post radiation fibrosis in the left upper lobe. Underlying malignancy is not excluded. Follow-up CT chest in 3 months to ensure resolution. Bilateral bronchial wall thickening and opacification of right basilar bronchials. These findings are compatible with mucoid impaction and an inflammatory process. Aortic Atherosclerosis (ICD10-I70.0). Electronically Signed   By: Marybelle Killings M.D.   On: 10/29/2018 17:22   Dg Chest Port 1 View  Result Date: 10/29/2018 CLINICAL DATA:  Short of breath EXAM: PORTABLE CHEST 1 VIEW COMPARISON:  10/24/2018 FINDINGS: Normal heart size. Lungs are under aerated. Hazy left perihilar  pulmonary opacities. No pneumothorax or pleural effusion. IMPRESSION: Hazy left perihilar airspace opacities. Followup PA and lateral chest X-ray is recommended in 3-4 weeks following trial of antibiotic therapy to ensure resolution and exclude underlying malignancy. Electronically Signed   By: Marybelle Killings M.D.   On: 10/29/2018 13:47  Scheduled Meds: . albuterol  2 puff Inhalation Q6H  . aspirin EC  325 mg Oral BID  . atorvastatin  40 mg Oral Daily  . citalopram  40 mg Oral Daily  . docusate sodium  100 mg Oral BID  . enoxaparin (LOVENOX) injection  40 mg Subcutaneous Q24H  . gabapentin  300 mg Oral QHS  . pantoprazole  40 mg Oral BID  . sodium chloride flush  3 mL Intravenous Q12H  . traMADol  100 mg Oral BID   Continuous Infusions: . ceFEPime (MAXIPIME) IV    . lactated ringers 75 mL/hr at 10/30/18 0700  . [START ON 10/31/2018] vancomycin       LOS: 1 day    Time spent: 35 minutes.     Elmarie Shiley, MD [facility] Pager [phone]  If 7PM-7AM, please contact night-coverage [email or web address] Password [facility] 10/30/2018, 8:31 AM

## 2018-10-31 ENCOUNTER — Inpatient Hospital Stay (HOSPITAL_COMMUNITY): Payer: Medicare HMO

## 2018-10-31 DIAGNOSIS — R609 Edema, unspecified: Secondary | ICD-10-CM

## 2018-10-31 LAB — FERRITIN: Ferritin: 245 ng/mL (ref 11–307)

## 2018-10-31 LAB — BASIC METABOLIC PANEL
Anion gap: 9 (ref 5–15)
BUN: 37 mg/dL — ABNORMAL HIGH (ref 8–23)
CO2: 23 mmol/L (ref 22–32)
Calcium: 9.1 mg/dL (ref 8.9–10.3)
Chloride: 111 mmol/L (ref 98–111)
Creatinine, Ser: 1.13 mg/dL — ABNORMAL HIGH (ref 0.44–1.00)
GFR calc Af Amer: 52 mL/min — ABNORMAL LOW (ref >60)
GFR calc non Af Amer: 45 mL/min — ABNORMAL LOW (ref >60)
Glucose, Bld: 102 mg/dL — ABNORMAL HIGH (ref 70–99)
Potassium: 4 mmol/L (ref 3.5–5.1)
Sodium: 143 mmol/L (ref 135–145)

## 2018-10-31 LAB — CBC WITH DIFFERENTIAL/PLATELET
Abs Immature Granulocytes: 0.05 10*3/uL (ref 0.00–0.07)
Basophils Absolute: 0 10*3/uL (ref 0.0–0.1)
Basophils Relative: 0 %
Eosinophils Absolute: 0.3 10*3/uL (ref 0.0–0.5)
Eosinophils Relative: 3 %
HCT: 25.5 % — ABNORMAL LOW (ref 36.0–46.0)
Hemoglobin: 7.9 g/dL — ABNORMAL LOW (ref 12.0–15.0)
Immature Granulocytes: 1 %
Lymphocytes Relative: 17 %
Lymphs Abs: 1.8 10*3/uL (ref 0.7–4.0)
MCH: 30.4 pg (ref 26.0–34.0)
MCHC: 31 g/dL (ref 30.0–36.0)
MCV: 98.1 fL (ref 80.0–100.0)
Monocytes Absolute: 0.8 10*3/uL (ref 0.1–1.0)
Monocytes Relative: 8 %
Neutro Abs: 7.3 10*3/uL (ref 1.7–7.7)
Neutrophils Relative %: 71 %
Platelets: 271 10*3/uL (ref 150–400)
RBC: 2.6 MIL/uL — ABNORMAL LOW (ref 3.87–5.11)
RDW: 14.6 % (ref 11.5–15.5)
WBC: 10.3 10*3/uL (ref 4.0–10.5)
nRBC: 0 % (ref 0.0–0.2)

## 2018-10-31 LAB — IRON AND TIBC
Iron: 22 ug/dL — ABNORMAL LOW (ref 28–170)
Saturation Ratios: 12 % (ref 10.4–31.8)
TIBC: 188 ug/dL — ABNORMAL LOW (ref 250–450)
UIBC: 166 ug/dL

## 2018-10-31 LAB — GLUCOSE, CAPILLARY
Glucose-Capillary: 89 mg/dL (ref 70–99)
Glucose-Capillary: 112 mg/dL — ABNORMAL HIGH (ref 70–99)
Glucose-Capillary: 126 mg/dL — ABNORMAL HIGH (ref 70–99)

## 2018-10-31 LAB — VITAMIN B12: Vitamin B-12: 929 pg/mL — ABNORMAL HIGH (ref 180–914)

## 2018-10-31 LAB — FOLATE: Folate: 49.8 ng/mL (ref >5.9)

## 2018-10-31 LAB — PHOSPHORUS: Phosphorus: 3.8 mg/dL (ref 2.5–4.6)

## 2018-10-31 LAB — STREP PNEUMONIAE URINARY ANTIGEN: Strep Pneumo Urinary Antigen: NEGATIVE

## 2018-10-31 LAB — D-DIMER, QUANTITATIVE: D-Dimer, Quant: 3.44 ug/mL-FEU — ABNORMAL HIGH (ref 0.00–0.50)

## 2018-10-31 LAB — RETICULOCYTES
Immature Retic Fract: 16.9 % — ABNORMAL HIGH (ref 2.3–15.9)
RBC.: 2.6 MIL/uL — ABNORMAL LOW (ref 3.87–5.11)
Retic Count, Absolute: 65.8 10*3/uL (ref 19.0–186.0)
Retic Ct Pct: 2.5 % (ref 0.4–3.1)

## 2018-10-31 LAB — C-REACTIVE PROTEIN: CRP: 13.1 mg/dL — ABNORMAL HIGH (ref <1.0)

## 2018-10-31 LAB — MRSA PCR SCREENING: MRSA by PCR: NEGATIVE

## 2018-10-31 LAB — MAGNESIUM: Magnesium: 2 mg/dL (ref 1.7–2.4)

## 2018-10-31 MED ORDER — ENOXAPARIN SODIUM 40 MG/0.4ML ~~LOC~~ SOLN
40.0000 mg | SUBCUTANEOUS | Status: DC
  Administered 2018-10-31: 40 mg via SUBCUTANEOUS
  Filled 2018-10-31: qty 0.4

## 2018-10-31 MED ORDER — TRAMADOL HCL 50 MG PO TABS
100.0000 mg | ORAL_TABLET | Freq: Two times a day (BID) | ORAL | Status: DC
  Administered 2018-10-31 – 2018-11-01 (×2): 100 mg via ORAL
  Filled 2018-10-31 (×2): qty 2

## 2018-10-31 MED ORDER — FERROUS SULFATE 325 (65 FE) MG PO TABS
325.0000 mg | ORAL_TABLET | Freq: Two times a day (BID) | ORAL | Status: DC
  Administered 2018-10-31 – 2018-11-01 (×2): 325 mg via ORAL
  Filled 2018-10-31 (×2): qty 1

## 2018-10-31 MED ORDER — TRAMADOL HCL 50 MG PO TABS: 50.0000 mg | ORAL_TABLET | Freq: Three times a day (TID) | ORAL | Status: DC | PRN

## 2018-10-31 NOTE — Progress Notes (Signed)
427/2020 Per Dr Tyrell Antonio it is ok to Discontinue Droplet and contact sign for Covid-19. Rico Sheehan RN

## 2018-10-31 NOTE — Progress Notes (Signed)
Bilateral lower extremity venous duplex has been completed. Preliminary results can be found in CV Proc through chart review.   10/31/18 1:17 PM Brandy [name] RVT

## 2018-10-31 NOTE — Evaluation (Signed)
Clinical/Bedside Swallow Evaluation Patient Details  Name: Brandy [name] MRN: [id number] Date of Birth: [birth year]  Today's Date: 10/31/2018 Time: SLP Start Time (ACUTE ONLY): 6433 SLP Stop Time (ACUTE ONLY): 0830 SLP Time Calculation (min) (ACUTE ONLY): 18 min  Past Medical History:  Past Medical History:  Diagnosis Date  . Anemia   . Arthritis    shoulders  . Asthma    mild, no inhalers used  . Breast cancer ([place]) 12/18/11   left breast masectomy=metastatic ca in (1/1) lymph node ,invasive ductal ca,2 foci,,dcis,lymph ovascular invasion identified,surgical resection margins neg for ca,additional tissue=benign skin and subcutaneous tissue  . Bronchitis    hx of;last time >19yr ago  . Depression    takes Celexa daily  . Difficult intubation   . Dysphagia   . Full dentures   . Gall stones 2015  . GERD (gastroesophageal reflux disease)    takes Prilosec prn  . H/O hiatal hernia   . History of kidney stones    many yrs ago  . Hx of radiation therapy 04/26/12 -06/10/12   left breast  . Hyperlipidemia    takes Simvastatin daily  . Hypertension    takes Amlodipine and Diovan daily  . Insomnia    takes Ambien nightly  . Urinary urgency    Past Surgical History:  Past Surgical History:  Procedure Laterality Date  . APPENDECTOMY    . bladder tack    . BREAST BIOPSY  1998   left   . DILATION AND CURETTAGE OF UTERUS    . ENDOSCOPIC RETROGRADE CHOLANGIOPANCREATOGRAPHY (ERCP) WITH PROPOFOL N/A 02/01/2014   Procedure: ENDOSCOPIC RETROGRADE CHOLANGIOPANCREATOGRAPHY (ERCP) WITH PROPOFOL;  Surgeon: [name], MD;  Location: [facility];  Service: Endoscopy;  Laterality: N/A;  . ESOPHAGOGASTRODUODENOSCOPY    . ESOPHAGOGASTRODUODENOSCOPY N/A 06/26/2016   Procedure: ESOPHAGOGASTRODUODENOSCOPY (EGD);  Surgeon: [name], MD;  Location: [place];  Service: Endoscopy;  Laterality: N/A;  . ESOPHAGOGASTRODUODENOSCOPY (EGD) WITH PROPOFOL  12/19/2013   Procedure:  ESOPHAGOGASTRODUODENOSCOPY (EGD) WITH PROPOFOL;  Surgeon: Beryle Beams, MD;  Location: [place];  Service: Endoscopy;;  . ESOPHAGOGASTRODUODENOSCOPY (EGD) WITH PROPOFOL N/A 09/10/2016   Procedure: ESOPHAGOGASTRODUODENOSCOPY (EGD) WITH PROPOFOL;  Surgeon: [name], MD;  [facility]: [facility];  Service: Endoscopy;  Laterality: N/A;  . EUS  06/04/2011   Procedure: UPPER ENDOSCOPIC ULTRASOUND (EUS) LINEAR;  Surgeon: Owens Loffler, MD;  Location: [facility];  Service: Endoscopy;  Laterality: N/A;  . EUS N/A 02/01/2014   Procedure: UPPER ENDOSCOPIC ULTRASOUND (EUS) LINEAR;  Surgeon: [name], MD;  [facility];  Service: Endoscopy;  Laterality: N/A;  . EXPLORATORY LAPAROTOMY      biopsy of intra-abdominal mass  . INTRAMEDULLARY (IM) NAIL INTERTROCHANTERIC Right 10/24/2018   Procedure: INTRAMEDULLARY (IM) NAIL INTERTROCHANTRIC;  Surgeon: Gaynelle Arabian, MD;  Location: [facility];  Service: Orthopedics;  Laterality: Right;  . LAPAROTOMY N/A 11/17/2016   Procedure: EXPLORATORY LAPAROTOMY WITH LYSIS OF ADHESIONS, SMALL BOWEL RESECTION, REPAIR OF INCARCERATED VENTRAL INCISIONAL HERNIA, INSERTION OF BIOLOGIC MESH PATCH,APPLICATION OF WOUND VAC DRESSING;  Surgeon: Armandina Gemma, MD;  Location: [facility];  Service: General;  Laterality: N/A;  . MASTECTOMY W/ SENTINEL NODE BIOPSY  12/18/2011   Procedure: MASTECTOMY WITH SENTINEL LYMPH NODE BIOPSY;  Surgeon: Rolm Bookbinder, MD;  Location: [place];  Service: General;  Laterality: Left;  . PORT-A-CATH REMOVAL Right 06/15/2013   Procedure: REMOVAL PORT-A-CATH;  Surgeon: Rolm Bookbinder, MD;  Location: [place];  Service: General;  Laterality: Right;  .  PORTACATH PLACEMENT  01/27/2012   Procedure: INSERTION PORT-A-CATH;  Surgeon: Rolm Bookbinder, MD;  Location: [place];  Service: General;  Laterality: Right;  PORT PLACEMENT  . TOTAL SHOULDER REPLACEMENT  2011   left  . TUBAL LIGATION     HPI:  85 y.o.  female with medical history significant of HTN; HLD; mild dementia; mild-moderate AS; and breast CA presenting with SOB.  She was admitted with a hip fracture from 4/20-22 and discharged home with daughter. CT of chest 4/25 signficant for "the distal esophagus is prominent. Underlying mass is not excluded, ill-defined patchy opacity in the left upper lobe   Assessment / Plan / Recommendation Clinical Impression  Pt presents with a mild oral dysphagia and suspected pharyngeal dysphagia. Pt with planned barium swallow study this date for esophageal workup. Pt edentulous and states she has dentures at home though they recently broke. Prolonged mastication noted with solid PO, pt states she chops foods at baseline. Pt with significant dysphonia, when questioned she states that this is her baseline vocal function. Intermittent delayed cough exhibited following thin liquids concerning for reduced airway protection. Recommend follow up modified barium swallow study to objectivly assess aspiration risk. Pt okay for dysphagia 3  (mechanical soft) and thin liquids with medicines in puree with safe swallow strategies until completion of MBSS.   SLP Visit Diagnosis: Dysphagia, unspecified (R13.10)    Aspiration Risk  Mild aspiration risk;Moderate aspiration risk    Diet Recommendation   Dysphagia 3 (mechanical soft) thin liquids  Medication Administration: Whole meds with puree    Other  Recommendations Oral Care Recommendations: Oral care BID   Follow up Recommendations 24 hour supervision/assistance      Frequency and Duration min 1 x/week  2 weeks       Prognosis Prognosis for Safe Diet Advancement: Good      Swallow Study   General Date of Onset: 10/29/18 HPI: 85 y.o. female with medical history significant of HTN; HLD; mild dementia; mild-moderate AS; and breast CA presenting with SOB.  She was admitted with a hip fracture from 4/20-22 and discharged home with daughter. CT of chest 4/25  signficant for "the distal esophagus is prominent. Underlying mass is not excluded, ill-defined patchy opacity in the left upper lobe Type of Study: Bedside Swallow Evaluation Previous Swallow Assessment: none on file Diet Prior to this Study: Regular;Thin liquids Temperature Spikes Noted: Yes Respiratory Status: Nasal cannula History of Recent Intubation: No Behavior/Cognition: Alert;Cooperative;Requires cueing Oral Cavity Assessment: Within Functional Limits Oral Cavity - Dentition: Edentulous(states has some dentures at home though recently broken ) Vision: Functional for self-feeding Self-Feeding Abilities: Able to feed self Patient Positioning: Upright in bed Baseline Vocal Quality: Aphonic;Breathy;Low vocal intensity Volitional Cough: Weak Volitional Swallow: Unable to elicit    Oral/Motor/Sensory Function Overall Oral Motor/Sensory Function: Generalized oral weakness   Ice Chips Ice chips: Impaired Presentation: Spoon Oral Phase Functional Implications: Prolonged oral transit Pharyngeal Phase Impairments: Suspected delayed Swallow;Multiple swallows   Thin Liquid Thin Liquid: Impaired Presentation: Cup;Straw Pharyngeal  Phase Impairments: Multiple swallows;Suspected delayed Swallow;Cough - Delayed    Nectar Thick Nectar Thick Liquid: Not tested   Honey Thick Honey Thick Liquid: Not tested   Puree Puree: Within functional limits   Solid     Solid: Impaired Presentation: Self Fed Oral Phase Functional Implications: Prolonged oral transit;Impaired mastication Pharyngeal Phase Impairments: Suspected delayed Swallow;Multiple swallows      Aviannah Castoro E Giana Castner MA, CCC-SLP Acute Rehab Speech Language Pathologist  10/31/2018,8:55 AM

## 2018-10-31 NOTE — Evaluation (Signed)
Physical Therapy Evaluation Patient Details Name: Brandy [name] MRN: [id number] DOB: [birth year] Today's Date: 10/31/2018   History of Present Illness  Pt adm with acute hypoxic respiratory failure. Covid negative. Pt with recent rt hip fx with ORIF on 4/20. PMH - rt hip fx with ORIF, htn, chf, lt shoulder replacement, breas CA, OA, depression  Clinical Impression  Pt admitted with above diagnosis and presents to PT with functional limitations due to deficits listed below (See PT problem list). Pt needs skilled PT to maximize independence and safety to allow discharge to daughters home. Of note pt mobilized including ambulation on RA with SpO2 >92%. Left O2 off.      Follow Up Recommendations Home health PT;Supervision/Assistance - 24 hour    Equipment Recommendations  None recommended by PT    Recommendations for Other Services       Precautions / Restrictions Precautions Precautions: Fall Restrictions Weight Bearing Restrictions: Yes RLE Weight Bearing: Weight bearing as tolerated      Mobility  Bed Mobility Overal bed mobility: Needs Assistance Bed Mobility: Supine to Sit     Supine to sit: Min assist     General bed mobility comments: assist to move RLE and to elevate trunk into sitting  Transfers Overall transfer level: Needs assistance Equipment used: Rolling walker (2 wheeled) Transfers: Sit to/from [facility] Sit to Stand: Min assist Stand pivot transfers: Min assist       General transfer comment: Assist to bring hips up and for balance. Bed to bsc to recliner with walker. Verbal cues for hand placement  Ambulation/Gait Ambulation/Gait assistance: Min assist Gait Distance (Feet): 20 Feet Assistive device: Rolling walker (2 wheeled) Gait Pattern/deviations: Step-to pattern;Decreased step length - right;Decreased step length - left;Antalgic Gait velocity: decr Gait velocity interpretation: <1.31 ft/sec, indicative of household  ambulator General Gait Details: Assist for balance and support  Stairs            Wheelchair Mobility    Modified Rankin (Stroke Patients Only)       Balance Overall balance assessment: Needs assistance;History of Falls Sitting-balance support: No upper extremity supported;Feet supported Sitting balance-Leahy Scale: Fair     Standing balance support: Bilateral upper extremity supported;During functional activity Standing balance-Leahy Scale: Poor Standing balance comment: walker and min assist for static standing                             Pertinent Vitals/Pain Pain Assessment: Faces Faces Pain Scale: Hurts little more Pain Location: rt hip Pain Descriptors / Indicators: Sore;Discomfort Pain Intervention(s): Limited activity within patient's tolerance;Monitored during session;Repositioned    Home Living Family/patient expects to be discharged to:: Private residence Living Arrangements: Spouse/significant other;Children(Pt to stay with daughter at [place]) Available Help at Discharge: Family;Available 24 hours/day Type of Home: House Home Access: Level entry     Home Layout: One level Home Equipment: Walker - 2 wheels;Cane - single point;Shower seat      Prior Function Level of Independence: Needs assistance   Gait / Transfers Assistance Needed: Prior to hip fx pt modified independent using cane or walker. Since fx pt assist with amb with walker            Hand Dominance   Dominant Hand: Right    Extremity/Trunk Assessment   Upper Extremity Assessment Upper Extremity Assessment: Generalized weakness    Lower Extremity Assessment Lower Extremity Assessment: Generalized weakness;RLE deficits/detail RLE Deficits / Details: Limited by soreness  Communication   Communication: HOH  Cognition Arousal/Alertness: Awake/alert Behavior During Therapy: WFL for tasks assessed/performed Overall Cognitive Status: Within Functional Limits for  tasks assessed                                        General Comments      Exercises     Assessment/Plan    PT Assessment Patient needs continued PT services  PT Problem List Decreased strength;Decreased activity tolerance;Decreased balance;Decreased mobility;Decreased knowledge of use of DME       PT Treatment Interventions DME instruction;Gait training;Functional mobility training;Therapeutic activities;Therapeutic exercise;Balance training;Patient/family education    PT Goals (Current goals can be found in the Care Plan section)  Acute Rehab PT Goals Patient Stated Goal: go home tomorrow PT Goal Formulation: With patient/family Time For Goal Achievement: 11/07/18 Potential to Achieve Goals: Good    Frequency Min 3X/week   Barriers to discharge        Co-evaluation               AM-PAC PT "6 Clicks" Mobility  Outcome Measure Help needed turning from your back to your side while in a flat bed without using bedrails?: A Little Help needed moving from lying on your back to sitting on the side of a flat bed without using bedrails?: A Little Help needed moving to and from a bed to a chair (including a wheelchair)?: A Little Help needed standing up from a chair using your arms (e.g., wheelchair or bedside chair)?: A Little Help needed to walk in hospital room?: A Little Help needed climbing 3-5 steps with a railing? : A Lot 6 Click Score: 17    End of Session Equipment Utilized During Treatment: Gait belt Activity Tolerance: Patient tolerated treatment well Patient left: in chair;with call bell/phone within reach;with chair alarm set Nurse Communication: Mobility status PT Visit Diagnosis: Unsteadiness on feet (R26.81);History of falling (Z91.81);Other abnormalities of gait and mobility (R26.89)    Time: 1440-1525 PT Time Calculation (min) (ACUTE ONLY): 45 min   Charges:   PT Evaluation $PT Eval Moderate Complexity: 1 Mod PT  Treatments $Gait Training: 23-37 mins        Ayden Pager [phone] Office [place] 10/31/2018, 4:57 PM

## 2018-10-31 NOTE — Progress Notes (Signed)
PROGRESS NOTE    Brandy [name]  JKK:[id number] DOB: 01-18-1933 DOA: 10/29/2018 PCP: Kathyrn Lass, MD    Brief Narrative:    Assessment & Plan: 85 year old with past medical history significant for hypertension, hyperlipidemia, mild dementia, mild to moderate aortic stenosis, breast cancer, who presents complaining of shortness of breath.  Patient was admitted with a hip fracture from 4/20-4/22 and discharged home with daughter.  Patient was doing well.  Over the last couple of days patient started to have worsening cough, wheezing and patient was noted to be short of breath.  At the urgent care patient was found to be febrile.  Oxygen saturation was 80% she was a started on oxygen supplementation.  COVID test time 2 negative.    Principal Problem:   Acute respiratory failure with hypoxia (HCC) Active Problems:   Hyperlipidemia   Essential hypertension   Acute renal failure (ARF) (HCC)   S/P right hip fracture   1-Acute hypoxic respiratory failure; secondary to pneumonia -Patient presented with shortness of breath, cough fever. -She Is requiring 2 L of oxygen supplementation. Pertinent labs: Triglyceride 295, LDH 207, ferritin 121, CRP 8--- 14.  Procalcitonin less than 0.1, d-dimer 2.9, fibrinogen 596. CT angio:The distal esophagus is prominent. Underlying mass is not excluded. Consider esophagram or upper endoscopy to further characterize. There is an ill-defined patchy opacity in the left upper lobe measuring up to 1.8 cm. There are also patchy opacities at the left base. These findings may reflect an inflammatory process at the lung base and post radiation fibrosis in the left upper lobe. Underlying malignancy is not excluded. Follow-up CT chest in 3 months to ensure resolution. -Send out test for COVID negative.   Rapid COVID 19 test negative -CRP and D dimer elevated.  -Treating for Bacterial PNA.  Continue with vancomycin and cefepime. Check MRSA PCR>   Acute renal failure;  suspect prerenal' Improved with IV fluids. NSL/  Continue to hold hydrochlorothiazide and losartan.  Bilateral lower extremity edema worse on the right: Doppler LE negative for DVT>   HTN; continue to hold blood pressure medications: Valsartan, hydrochlorothiazide, amlodipine, spironolactone.  Aortic stenosis: Mild to moderate by echo 2018.  Follow-up as an outpatient.  Recent hip Fracture;  She was on aspirin twice daily for DVT prophylaxis. We will continue while she is in-house with Lovenox. PT consulted.   Esophageal prominent on CT: Patient reports that she has had in the past endoscopy to evaluate for dysphagia. Esophagogram; No gross esophageal abnormality on this limited single contrast study. Specifically, no evidence for mass lesion in the distal esophagus. Nonspecific esophageal motility disorderSmall hiatal hernia. Continue with PPI Speech therapy recommendation;   Dysphagia 3 (Mech soft) solids;Thin liquid   Liquid Administration via Cup  Medication Administration Whole meds with puree  Compensations Minimize environmental distractions;Slow rate;Small sips/bites  Postural Changes Remain semi-upright after after feeds/meals (Comment);Seated upright at 90 degrees    Anemia;  No evidence of active bleeding.  Hb improved today.  Iron deficiency, started  ferrous sulfate.   Estimated body mass index is 29.3 kg/m as calculated from the following:   Height as of this encounter: 4\' 11"  (1.499 m).   Weight as of this encounter: 65.8 kg.   DVT prophylaxis: lovenox Code Status: DNR Family Communication: Daughter over phone.  Disposition Plan: home in 24 to 48 hours. Continue with IV antibiotics.  Consultants:   none   Procedures:  Esophagogram.    Antimicrobials:  Cefepime 4-26  Vancomycin 4-26   Subjective:  She is feeling better, asking when she can go home. Breathing better.    Objective: Vitals:   10/30/18 2238 10/30/18 2300 10/31/18 0605  10/31/18 0816  BP: (!) 116/43 (!) 115/39 (!) 109/44 (!) 117/92  Pulse: 84 82  81  Resp: 18 18 20    Temp: 99.4 F (37.4 C) 98.6 F (37 C) 98 F (36.7 C) 97.6 F (36.4 C)  TempSrc: Oral Oral Oral Oral  SpO2: 96% 96% 97% 96%  Weight:      Height:        Intake/Output Summary (Last 24 hours) at 10/31/2018 1409 Last data filed at 10/31/2018 0500 Gross per 24 hour  Intake 160.38 ml  Output 350 ml  Net -189.62 ml   Filed Weights   10/30/18 0421  Weight: 65.8 kg    Examination:  General exam: NAD Respiratory system: BL ronchus  Cardiovascular system: S 1, S 2 RRR Gastrointestinal system: BS present, soft, nt Central nervous system:non focal/  Extremities: R LE with dressing.  Skin: no rashes.   Data Reviewed: I have personally reviewed following labs and imaging studies  CBC: Recent Labs  Lab 10/24/18 1546  10/25/18 0400 10/26/18 0425 10/29/18 1340 10/30/18 0610 10/31/18 0645  WBC 11.2*   < > 8.7 9.2 16.7* 11.8* 10.3  NEUTROABS 8.1*  --   --   --  13.1* 8.9* 7.3  HGB 9.7*   < > 8.6* 9.4* 9.0* 7.1* 7.9*  HCT 31.4*   < > 28.4* 30.6* 30.1* 22.9* 25.5*  MCV 100.3*   < > 100.7* 100.3* 100.7* 98.7 98.1  PLT 235   < > 208 241 327 254 271   < > = values in this interval not displayed.   Basic Metabolic Panel: Recent Labs  Lab 10/24/18 1900 10/25/18 0400 10/26/18 0425 10/29/18 1340 10/30/18 0610 10/31/18 0645  NA  --  138 140 138 142 143  K  --  4.3 3.6 4.2 3.9 4.0  CL  --  110 108 103 111 111  CO2  --  15* 24 22 23 23   GLUCOSE  --  172* 109* 141* 110* 102*  BUN  --  25* 20 56* 52* 37*  CREATININE 1.16* 0.96 0.80 1.54* 1.36* 1.13*  CALCIUM  --  8.5* 9.0 9.6 9.1 9.1  MG 2.0  --   --   --  2.0 2.0  PHOS 4.1  --   --   --  4.2 3.8   GFR: Estimated Creatinine Clearance: 31.1 mL/min (A) (by C-G formula based on SCr of 1.13 mg/dL (H)). Liver Function Tests: Recent Labs  Lab 10/29/18 1340 10/30/18 0610  AST 25 14*  ALT 19 15  ALKPHOS 78 65  BILITOT 0.9 0.5    PROT 6.4* 5.5*  ALBUMIN 2.9* 2.4*   No results for input(s): LIPASE, AMYLASE in the last 168 hours. No results for input(s): AMMONIA in the last 168 hours. Coagulation Profile: No results for input(s): INR, PROTIME in the last 168 hours. Cardiac Enzymes: Recent Labs  Lab 10/24/18 1900 10/24/18 2348  TROPONINI <0.03 <0.03   BNP (last 3 results) No results for input(s): PROBNP in the last 8760 hours. HbA1C: No results for input(s): HGBA1C in the last 72 hours. CBG: Recent Labs  Lab 10/31/18 0816  GLUCAP 89   Lipid Profile: Recent Labs    10/29/18 1340  TRIG 295*   Thyroid Function Tests: No results for input(s): TSH, T4TOTAL, FREET4, T3FREE, THYROIDAB in the last 72 hours. Anemia Panel:  Recent Labs    10/29/18 1340 10/31/18 0645  VITAMINB12  --  929*  FOLATE  --  49.8  FERRITIN 121 245  TIBC  --  188*  IRON  --  22*  RETICCTPCT  --  2.5   Sepsis Labs: Recent Labs  Lab 10/29/18 1340 10/30/18 0610  PROCALCITON 0.10 <0.10  LATICACIDVEN 1.3  --     Recent Results (from the past 240 hour(s))  Surgical PCR screen     Status: None   Collection Time: 10/24/18  7:34 PM  Result Value Ref Range Status   MRSA, PCR NEGATIVE NEGATIVE Final   Staphylococcus aureus NEGATIVE NEGATIVE Final    Comment: (NOTE) The Xpert SA Assay (FDA approved for NASAL specimens in patients 90 years of age and older), is one component of a comprehensive surveillance program. It is not intended to diagnose infection nor to guide or monitor treatment. Performed at [facility], [place] [address] [place], [place] [address]   Blood Culture (routine x 2)     Status: None (Preliminary result)   Collection Time: 10/29/18  1:05 PM  Result Value Ref Range Status   Specimen Description BLOOD LEFT ARM  Final   Special Requests AEROBIC BOTTLE ONLY Blood Culture adequate volume  Final   Culture   Final    NO GROWTH 2 DAYS Performed at [facility], [place] [address] [place], [place] [address]    Report Status PENDING  Incomplete  SARS Coronavirus 2 [facility] order, Performed in [name] hospital lab)     Status: None   Collection Time: 10/29/18  1:25 PM  Result Value Ref Range Status   SARS Coronavirus 2 NEGATIVE NEGATIVE Final    Comment: (NOTE) If result is NEGATIVE SARS-CoV-2 target nucleic acids are NOT DETECTED. The SARS-CoV-2 RNA is generally detectable in upper and lower  respiratory specimens during the acute phase of infection. The lowest  concentration of SARS-CoV-2 viral copies this assay can detect is 250  copies / mL. A negative result does not preclude SARS-CoV-2 infection  and should not be used as the sole basis for treatment or other  patient management decisions.  A negative result may occur with  improper specimen collection / handling, submission of specimen other  than nasopharyngeal swab, presence of viral mutation(s) within the  areas targeted by this assay, and inadequate number of viral copies  (<250 copies / mL). A negative result must be combined with clinical  observations, patient history, and epidemiological information. If result is POSITIVE SARS-CoV-2 target nucleic acids are DETECTED. The SARS-CoV-2 RNA is generally detectable in upper and lower  respiratory specimens dur ing the acute phase of infection.  Positive  results are indicative of active infection with SARS-CoV-2.  Clinical  correlation with patient history and other diagnostic information is  necessary to determine patient infection status.  Positive results do  not rule out bacterial infection or co-infection with other viruses. If result is PRESUMPTIVE POSTIVE SARS-CoV-2 nucleic acids MAY BE PRESENT.   A presumptive positive result was obtained on the submitted specimen  and confirmed on repeat testing.  While 2019 novel coronavirus  (SARS-CoV-2) nucleic acids may be present in the submitted sample  additional confirmatory testing may be  necessary for epidemiological  and / or clinical management purposes  to differentiate between  SARS-CoV-2 and other Sarbecovirus currently known to infect humans.  If clinically indicated additional testing with an alternate test  methodology [phone]) is advised. The  SARS-CoV-2 RNA is generally  detectable in upper and lower respiratory sp ecimens during the acute  phase of infection. The expected result is Negative. Fact Sheet for Patients:  StrictlyIdeas.no Fact Sheet for Healthcare Providers: BankingDealers.co.za This test is not yet approved or cleared by the [place] FDA and has been authorized for detection and/or diagnosis of SARS-CoV-2 by FDA under an Emergency Use Authorization (EUA).  This EUA will remain in effect (meaning this test can be used) for the duration of the COVID-19 declaration under Section 564(b)(1) of the Act, 21 U.S.C. section 360bbb-3(b)(1), unless the authorization is terminated or revoked sooner. Performed at [facility], [place] [address], [place], [place] [address]   Blood Culture (routine x 2)     Status: None (Preliminary result)   Collection Time: 10/29/18  1:30 PM  Result Value Ref Range Status   Specimen Description BLOOD RIGHT ANTECUBITAL  Final   Special Requests   Final    AEROBIC BOTTLE ONLY Blood Culture results may not be optimal due to an inadequate volume of blood received in culture bottles   Culture   Final    NO GROWTH 2 DAYS Performed at [facility], [place] [address] [place], [place] [address]    Report Status PENDING  Incomplete  Novel Coronavirus, NAA (hospital order; send-out to ref lab)     Status: None   Collection Time: 10/29/18  5:08 PM  Result Value Ref Range Status   SARS-CoV-2, NAA NOT DETECTED NOT DETECTED Final    Comment: (NOTE) This test was developed and its performance characteristics determined by [employer]. This test has not been  FDA cleared or approved. This test has been authorized by FDA under an Emergency Use Authorization (EUA). This test has been validated in accordance with the FDA's Guidance Document (Policy for [place] in Laboratories Certified to Perform High Complexity Testing under CLIA prior to Emergency Use Authorization for Coronavirus [id number] during the [facility] Emergency) issued on February 29th, 2020. FDA independent review of this validation is pending. This test is only authorized for the duration of time the declaration that circumstances exist justifying the authorization of the emergency use of in vitro diagnostic tests for detection of SARS-CoV- 2 virus and/or diagnosis of COVID-19 infection under section 564(b)(1) of the Act, 21 U.S.C. 025ENI-7(P)(8), unless the authorization is terminated or revoked sooner. Performed At: [facility] [address] [place], [place] [id number] Rush Farmer MD ER:[id number]    [place]  Final    Comment: Performed at [facility], [place] [address], [place], [place] [address]         Radiology Studies: Ct Angio Chest Pe W/cm &/or Wo Cm  Result Date: 10/29/2018 CLINICAL DATA:  Short of breath and hypoxia.  Recent hip surgery. EXAM: CT ANGIOGRAPHY CHEST WITH CONTRAST TECHNIQUE: Multidetector CT imaging of the chest was performed using the standard protocol during bolus administration of intravenous contrast. Multiplanar CT image reconstructions and MIPs were obtained to evaluate the vascular anatomy. CONTRAST:  64mL OMNIPAQUE IOHEXOL 350 MG/ML SOLN COMPARISON:  PET-CT 01/04/2012 FINDINGS: Cardiovascular: There are no filling defects in the pulmonary arterial tree to suggest acute pulmonary thromboembolism. There is no obvious evidence of aortic aneurysm or aortic dissection. Atherosclerotic vascular calcifications are noted. Mediastinum/Nodes: No abnormal mediastinal adenopathy. No pericardial  effusion. Visualized thyroid is unremarkable. The distal esophagus is prominent. On image 98, a large soft tissue density is located in the location of the esophagus.  Underlying mass is not excluded. Lungs/Pleura: There is a patchy opacity in the anterior left upper lobe measuring up to 1.8 cm. There are linear opacities extending into the adjacent lung parenchyma. There are lucencies within the patchy the opacity. There is occlusion of small bronchioles in the medial right lung base. There are patchy opacities at the left lung base on images 72 and 76 of series 6. Dependent atelectasis bilaterally. Bilateral lower lobe bronchial wall thickening. Upper Abdomen: No acute abnormality. Musculoskeletal: No vertebral compression deformity. Review of the MIP images confirms the above findings. IMPRESSION: There is no evidence of acute pulmonary thromboembolism The distal esophagus is prominent. Underlying mass is not excluded. Consider esophagram or upper endoscopy to further characterize. There is an ill-defined patchy opacity in the left upper lobe measuring up to 1.8 cm. There are also patchy opacities at the left base. These findings may reflect an inflammatory process at the lung base and post radiation fibrosis in the left upper lobe. Underlying malignancy is not excluded. Follow-up CT chest in 3 months to ensure resolution. Bilateral bronchial wall thickening and opacification of right basilar bronchials. These findings are compatible with mucoid impaction and an inflammatory process. Aortic Atherosclerosis (ICD10-I70.0). Electronically Signed   By: Marybelle Killings M.D.   On: 10/29/2018 17:22   Dg Swallowing Func-speech Pathology  Result Date: 10/31/2018 Objective Swallowing Evaluation: Type of Study: MBS-Modified Barium Swallow Study  Patient Details Name: GARA KINCADE MRN: [id number] Date of Birth: 10-17-1932 Today's Date: 10/31/2018 Time: SLP Start Time (ACUTE ONLY): 1200 -SLP Stop Time (ACUTE ONLY): 1212 SLP  Time Calculation (min) (ACUTE ONLY): 12 min Past Medical History: Past Medical History: Diagnosis Date  Anemia   Arthritis   shoulders  Asthma   mild, no inhalers used  Breast cancer ([facility]) 12/18/11  left breast masectomy=metastatic ca in (1/1) lymph node ,invasive ductal ca,2 foci,,dcis,lymph ovascular invasion identified,surgical resection margins neg for ca,additional tissue=benign skin and subcutaneous tissue  Bronchitis   hx of;last time >49yr ago  Depression   takes Celexa daily  Difficult intubation   Dysphagia   Full dentures   Gall stones 2015  GERD (gastroesophageal reflux disease)   takes Prilosec prn  H/O hiatal hernia   History of kidney stones   many yrs ago  Hx of radiation therapy 04/26/12 -06/10/12  left breast  Hyperlipidemia   takes Simvastatin daily  Hypertension   takes Amlodipine and Diovan daily  Insomnia   takes Ambien nightly  Urinary urgency  Past Surgical History: Past Surgical History: Procedure Laterality Date  APPENDECTOMY    bladder tack    BREAST BIOPSY  1998  left   DILATION AND CURETTAGE OF UTERUS    ENDOSCOPIC RETROGRADE CHOLANGIOPANCREATOGRAPHY (ERCP) WITH PROPOFOL N/A 02/01/2014  Procedure: ENDOSCOPIC RETROGRADE CHOLANGIOPANCREATOGRAPHY (ERCP) WITH PROPOFOL;  Surgeon: [name], MD;  Location: [facility];  Service: Endoscopy;  Laterality: N/A;  ESOPHAGOGASTRODUODENOSCOPY    ESOPHAGOGASTRODUODENOSCOPY N/A 06/26/2016  Procedure: ESOPHAGOGASTRODUODENOSCOPY (EGD);  Surgeon: [name], MD;  Location: [place];  Service: Endoscopy;  Laterality: N/A;  ESOPHAGOGASTRODUODENOSCOPY (EGD) WITH PROPOFOL  12/19/2013  Procedure: ESOPHAGOGASTRODUODENOSCOPY (EGD) WITH PROPOFOL;  Surgeon: Beryle Beams, MD;  Location: [facility];  Service: Endoscopy;;  ESOPHAGOGASTRODUODENOSCOPY (EGD) WITH PROPOFOL N/A 09/10/2016  Procedure: ESOPHAGOGASTRODUODENOSCOPY (EGD) WITH PROPOFOL;  Surgeon: [name], MD;  [facility]: [facility];  Service: Endoscopy;   Laterality: N/A;  EUS  06/04/2011  Procedure: UPPER ENDOSCOPIC ULTRASOUND (EUS) LINEAR;  Surgeon: Owens Loffler, MD;  Location: [facility];  Service:  Endoscopy;  Laterality: N/A;  EUS N/A 02/01/2014  Procedure: UPPER ENDOSCOPIC ULTRASOUND (EUS) LINEAR;  Surgeon: [name], MD;  [facility];  Service: Endoscopy;  Laterality: N/A;  EXPLORATORY LAPAROTOMY     biopsy of intra-abdominal mass  INTRAMEDULLARY (IM) NAIL INTERTROCHANTERIC Right 10/24/2018  Procedure: INTRAMEDULLARY (IM) NAIL INTERTROCHANTRIC;  Surgeon: Gaynelle Arabian, MD;  Location: [facility];  Service: Orthopedics;  Laterality: Right;  LAPAROTOMY N/A 11/17/2016  Procedure: EXPLORATORY LAPAROTOMY WITH LYSIS OF ADHESIONS, SMALL BOWEL RESECTION, REPAIR OF INCARCERATED VENTRAL INCISIONAL HERNIA, INSERTION OF BIOLOGIC MESH PATCH,APPLICATION OF WOUND VAC DRESSING;  Surgeon: Armandina Gemma, MD;  Location: [facility];  Service: General;  Laterality: N/A;  MASTECTOMY W/ SENTINEL NODE BIOPSY  12/18/2011  Procedure: MASTECTOMY WITH SENTINEL LYMPH NODE BIOPSY;  Surgeon: Rolm Bookbinder, MD;  Location: [facility];  Service: General;  Laterality: Left;  PORT-A-CATH REMOVAL Right 06/15/2013  Procedure: REMOVAL PORT-A-CATH;  Surgeon: Rolm Bookbinder, MD;  Location: [facility];  Service: General;  Laterality: Right;  PORTACATH PLACEMENT  01/27/2012  Procedure: INSERTION PORT-A-CATH;  Surgeon: Rolm Bookbinder, MD;  Location: [facility];  Service: General;  Laterality: Right;  PORT PLACEMENT  TOTAL SHOULDER REPLACEMENT  2011  left  TUBAL LIGATION   HPI: 85 y.o. female with medical history significant of HTN; HLD; mild dementia; mild-moderate AS; and breast CA presenting with SOB.  She was admitted with a hip fracture from 4/20-22 and discharged home with daughter. CT of chest 4/25 signficant for "the distal esophagus is prominent. Underlying mass is not excluded, ill-defined patchy opacity in the left upper lobe  Subjective: alert  upright in chair for procedure Assessment / Plan / Recommendation CHL IP CLINICAL IMPRESSIONS 10/31/2018 Clinical Impression A mild oropharyngeal dysphagia was exhibited. Oral deficits c/b prolonged mastication of solid PO in setting of edentulism and delayed oral transit with mild oral residuals. Pharyngeal deficits c/b decreased base of tongue retraction, decreased laryngeal elevation, reduced UES opening, and decreased laryngeal vestibule closure resulting in: one instance of transient penetration of thin liquids via straw sip. No aspiration was evidenced. Mild vallecular and pyriform sinus residuals were exhibited that cleared wtih a second volitional swallow.  SLP Visit Diagnosis Dysphagia, oropharyngeal phase (R13.12) Attention and concentration deficit following -- Frontal lobe and executive function deficit following -- Impact on safety and function Mild aspiration risk   CHL IP TREATMENT RECOMMENDATION 10/31/2018 Treatment Recommendations Therapy as outlined in treatment plan below   Prognosis 10/31/2018 Prognosis for Safe Diet Advancement Good Barriers to Reach Goals -- Barriers/Prognosis Comment -- CHL IP DIET RECOMMENDATION 10/31/2018 SLP Diet Recommendations Dysphagia 3 (Mech soft) solids;Thin liquid Liquid Administration via Cup Medication Administration Whole meds with puree Compensations Minimize environmental distractions;Slow rate;Small sips/bites Postural Changes Remain semi-upright after after feeds/meals (Comment);Seated upright at 90 degrees   CHL IP OTHER RECOMMENDATIONS 10/31/2018 Recommended Consults -- Oral Care Recommendations Oral care BID Other Recommendations --   CHL IP FOLLOW UP RECOMMENDATIONS 10/31/2018 Follow up Recommendations 24 hour supervision/assistance   CHL IP FREQUENCY AND DURATION 10/31/2018 Speech Therapy Frequency (ACUTE ONLY) min 1 x/week Treatment Duration 1 week      CHL IP ORAL PHASE 10/31/2018 Oral Phase Impaired Oral - Pudding Teaspoon -- Oral - Pudding Cup -- Oral - Honey  Teaspoon -- Oral - Honey Cup -- Oral - Nectar Teaspoon -- Oral - Nectar Cup Delayed oral transit Oral - Nectar Straw -- Oral - Thin Teaspoon -- Oral - Thin Cup WFL Oral - Thin Straw WFL Oral - Puree  Delayed oral transit Oral - Mech Soft -- Oral - Regular Delayed oral transit;Lingual/palatal residue Oral - Multi-Consistency -- Oral - Pill Delayed oral transit Oral Phase - Comment --  CHL IP PHARYNGEAL PHASE 10/31/2018 Pharyngeal Phase Impaired Pharyngeal- Pudding Teaspoon -- Pharyngeal -- Pharyngeal- Pudding Cup -- Pharyngeal -- Pharyngeal- Honey Teaspoon -- Pharyngeal -- Pharyngeal- Honey Cup -- Pharyngeal -- Pharyngeal- Nectar Teaspoon -- Pharyngeal -- Pharyngeal- Nectar Cup Reduced tongue base retraction;Pharyngeal residue - valleculae Pharyngeal Material does not enter airway Pharyngeal- Nectar Straw -- Pharyngeal -- Pharyngeal- Thin Teaspoon -- Pharyngeal -- Pharyngeal- Thin Cup Delayed swallow initiation-pyriform sinuses;Reduced tongue base retraction;Reduced anterior laryngeal mobility;Pharyngeal residue - pyriform;Pharyngeal residue - valleculae Pharyngeal Material does not enter airway Pharyngeal- Thin Straw Penetration/Aspiration before swallow;Reduced epiglottic inversion;Pharyngeal residue - valleculae;Pharyngeal residue - pyriform Pharyngeal Material enters airway, remains ABOVE vocal cords then ejected out Pharyngeal- Puree Reduced tongue base retraction;Pharyngeal residue - valleculae Pharyngeal Material does not enter airway Pharyngeal- Mechanical Soft -- Pharyngeal -- Pharyngeal- Regular Pharyngeal residue - valleculae;Reduced tongue base retraction Pharyngeal Material does not enter airway Pharyngeal- Multi-consistency -- Pharyngeal -- Pharyngeal- Pill Delayed swallow initiation-pyriform sinuses;Pharyngeal residue - valleculae Pharyngeal Material does not enter airway Pharyngeal Comment --  CHL IP CERVICAL ESOPHAGEAL PHASE 10/31/2018 Cervical Esophageal Phase Impaired Pudding Teaspoon -- Pudding Cup  -- Honey Teaspoon -- Honey Cup -- Nectar Teaspoon -- Nectar Cup Reduced cricopharyngeal relaxation Nectar Straw -- Thin Teaspoon -- Thin Cup Reduced cricopharyngeal relaxation Thin Straw -- Puree Reduced cricopharyngeal relaxation Mechanical Soft Reduced cricopharyngeal relaxation Regular Reduced cricopharyngeal relaxation Multi-consistency -- Pill Reduced cricopharyngeal relaxation Cervical Esophageal Comment -- Chelsea E Hartness MA, CCC-SLP 10/31/2018, 1:04 PM              Vas [place] Lower Extremity Venous (dvt)  Result Date: 10/31/2018  Lower Venous Study Indications: Edema.  Performing Technologist: Oliver Hum RVT  Examination Guidelines: A complete evaluation includes B-mode imaging, spectral Doppler, color Doppler, and power Doppler as needed of all accessible portions of each vessel. Bilateral testing is considered an integral part of a complete examination. Limited examinations for reoccurring indications may be performed as noted.  +---------+---------------+---------+-----------+----------+-------+  RIGHT     Compressibility Phasicity Spontaneity Properties Summary  +---------+---------------+---------+-----------+----------+-------+  CFV       Full            Yes       Yes                             +---------+---------------+---------+-----------+----------+-------+  SFJ       Full                                                      +---------+---------------+---------+-----------+----------+-------+  FV Prox   Full                                                      +---------+---------------+---------+-----------+----------+-------+  FV Mid    Full                                                      +---------+---------------+---------+-----------+----------+-------+  FV Distal Full                                                      +---------+---------------+---------+-----------+----------+-------+  PFV       Full                                                       +---------+---------------+---------+-----------+----------+-------+  POP       Full            Yes       Yes                             +---------+---------------+---------+-----------+----------+-------+  PTV       Full                                                      +---------+---------------+---------+-----------+----------+-------+  PERO      Full                                                      +---------+---------------+---------+-----------+----------+-------+   +---------+---------------+---------+-----------+----------+-------+  LEFT      Compressibility Phasicity Spontaneity Properties Summary  +---------+---------------+---------+-----------+----------+-------+  CFV       Full            Yes       Yes                             +---------+---------------+---------+-----------+----------+-------+  SFJ       Full                                                      +---------+---------------+---------+-----------+----------+-------+  FV Prox   Full                                                      +---------+---------------+---------+-----------+----------+-------+  FV Mid    Full                                                      +---------+---------------+---------+-----------+----------+-------+  FV Distal Full                                                      +---------+---------------+---------+-----------+----------+-------+  PFV       Full                                                      +---------+---------------+---------+-----------+----------+-------+  POP       Full            Yes       Yes                             +---------+---------------+---------+-----------+----------+-------+  PTV       Full                                                      +---------+---------------+---------+-----------+----------+-------+  PERO      Full                                                      +---------+---------------+---------+-----------+----------+-------+     Summary:  Right: There is no evidence of deep vein thrombosis in the lower extremity. No cystic structure found in the popliteal fossa. Left: There is no evidence of deep vein thrombosis in the lower extremity. No cystic structure found in the popliteal fossa.  *See table(s) above for measurements and observations.    Preliminary    Dg Esophagus W Single Cm (sol Or Thin Ba)  Result Date: 10/31/2018 CLINICAL DATA:  Difficulty swallowing. EXAM: ESOPHOGRAM/BARIUM SWALLOW TECHNIQUE: Single contrast examination was performed using  thin barium. FLUOROSCOPY TIME:  Fluoroscopy Time:  1 minutes and 6 seconds. Radiation Exposure Index (if provided by the fluoroscopic device): 28.1 mGy Number of Acquired Spot Images: COMPARISON:  CT scan 10/29/2018 FINDINGS: Evaluation limited by immobility and inability to tolerate upright positioning. No evidence for esophageal mass lesion, diverticulum, stricture, or gross ulceration. Disruption of primary peristalsis is compatible with nonspecific esophageal motility disorder. Tertiary contractions are observed in the mid and distal esophagus. Small hiatal hernia noted. No evidence for mass lesion in the distal esophagus. IMPRESSION: 1. No gross esophageal abnormality on this limited single contrast study. Specifically, no evidence for mass lesion in the distal esophagus. 2. Nonspecific esophageal motility disorder. 3. Small hiatal hernia. Electronically Signed   By: Misty Stanley M.D.   On: 10/31/2018 13:34        Scheduled Meds:  albuterol  2 puff Inhalation Q6H   aspirin EC  81 mg Oral Daily   atorvastatin  40 mg Oral Daily   citalopram  40 mg Oral Daily   docusate sodium  100 mg Oral BID   enoxaparin (LOVENOX) injection  40 mg Subcutaneous Q24H   ferrous sulfate  325 mg Oral BID WC   gabapentin  300 mg Oral QHS   pantoprazole  40 mg Oral BID   sodium chloride flush  3 mL Intravenous Q12H   traMADol  100 mg Oral Q12H   Continuous Infusions:  sodium chloride  10 mL/hr at 10/30/18 1829   ceFEPime (MAXIPIME) IV Stopped (10/30/18 1828)   vancomycin  LOS: 2 days    Time spent: 35 minutes.     Elmarie Shiley, MD [facility] Pager [phone]  If 7PM-7AM, please contact night-coverage [email or web address] Password [email or web address] 10/31/2018, 2:09 PM

## 2018-10-31 NOTE — TOC Initial Note (Signed)
Transition of Care [facility]) - Initial/Assessment Note    Patient Details  Name: Brandy [name] MRN: [id number] Date of Birth: 1932-10-15  Transition of Care [facility]) CM/SW Contact:    Carles Collet, RN Phone Number: 10/31/2018, 11:45 AM  Clinical Narrative:                  PAtient recently discharged.  Spoke w patient at bedside. She states that she is from home w spouse, her daughter is supportive and assists with care as wel.. Was DC'd with RW and HH through [facility] previously and is active for PT, Durene [facility]ru[facility] is aware of admission.    Expected Discharge Plan: [place] Barriers to Discharge: Continued Medical Work up   Patient Goals and CMS Choice Patient states their goals for this hospitalization and ongoing recovery are:: to go back home      Expected Discharge Plan and Services Expected Discharge Plan: [place]   Discharge Planning Services: CM Consult Post Acute Care Choice: Resumption of Svcs/PTA Provider Living arrangements for the past 2 months: [place]: PT [place] Agency: [place] ([place]) Date [place]: 10/31/18 Time [place]: 1143 Representative spoke with at [place]: Butch Penny called to verify that patient was active  Prior Living Arrangements/Services Living arrangements for the past 2 months: [place] with:: Spouse              Current home services: DME, Homehealth aide    Activities of Daily Living Home Assistive Devices/Equipment: Environmental consultant (specify type), Cane (specify quad or straight) ADL Screening (condition at time of admission) Patient's cognitive ability adequate to safely complete daily activities?: Yes Is the patient deaf or have difficulty hearing?: No Does the patient have difficulty seeing, even when wearing glasses/contacts?: No Does the patient have difficulty concentrating, remembering, or making  decisions?: Yes Patient able to express need for assistance with ADLs?: Yes Does the patient have difficulty dressing or bathing?: Yes Independently performs ADLs?: No Communication: Independent Dressing (OT): Needs assistance Is this a change from baseline?: Pre-admission baseline Grooming: Needs assistance Is this a change from baseline?: Pre-admission baseline Feeding: Independent Bathing: Dependent Is this a change from baseline?: Pre-admission baseline Toileting: Dependent Is this a change from baseline?: Pre-admission baseline In/Out Bed: Dependent Is this a change from baseline?: Pre-admission baseline Walks in Home: Dependent Is this a change from baseline?: Pre-admission baseline Does the patient have difficulty walking or climbing stairs?: Yes Weakness of Legs: Right Weakness of Arms/Hands: None  Permission Sought/Granted   Permission granted to share information with : Yes, Verbal Permission Granted  Share Information with NAME: Higgins,[name] Daughter [phone]  [phone]            Emotional Assessment              Admission diagnosis:  HCAP (healthcare-associated pneumonia) [J18.9] Patient Active Problem List   Diagnosis Date Noted  . Acute respiratory failure with hypoxia ([place]) 10/29/2018  . S/P right hip fracture 10/24/2018  . Acute on chronic diastolic CHF (congestive heart failure) ([place]) 01/19/2017  . HCAP (healthcare-associated pneumonia) 12/11/2016  . S/P exploratory laparotomy 11/17/2016  . Small bowel obstruction ([place]) 11/17/2016  . Pre-operative cardiovascular examination   . Incarcerated incisional hernia s/p SB resection & repair 11/17/2016 11/15/2016  .  Acute renal failure (ARF) ([place]) 11/15/2016  . Hyperglycemia 11/15/2016  . Asthma without status asthmaticus 09/24/2016  . Carpal tunnel syndrome 09/24/2016  . Asthma 09/24/2016  . Dark stools 09/24/2016  . Degenerative arthritis of lumbar spine 09/24/2016  . DJD (degenerative  joint disease), cervical 09/24/2016  . Dyspnea on exertion 09/24/2016  . Generalized anxiety disorder 09/24/2016  . Lumbosacral spondylosis without myelopathy 09/24/2016  . Morbid (severe) obesity due to excess calories ([place]) 09/24/2016  . Pernicious anemia 09/24/2016  . Pure hypercholesterolemia 09/24/2016  . Right knee pain 09/24/2016  . Sleep disorder 09/24/2016  . Vitamin B 12 deficiency 09/24/2016  . Vitamin D deficiency 09/24/2016  . Gastric ulcer   . Iron deficiency anemia due to chronic blood loss   . Abdominal pain, chronic, epigastric   . Acute gastric ulcer without hemorrhage or perforation   . Duodenal ulcer   . Closed fracture of nasal septum 08/27/2015  . Closed fracture of zygomatic arch ([place]) 08/27/2015  . Protein-calorie malnutrition, severe ([place]) 12/18/2013  . Choledocholithiasis 12/17/2013  . Sepsis ([place]) 12/17/2013  . Osteopenia 11/17/2012  . Moderate aortic stenosis 10/17/2012  . Hx of radiation therapy   . Depression   . History of kidney stones   . Urinary urgency   . Gastroesophageal reflux disease without esophagitis   . Hyperlipidemia   . Essential hypertension   . Bronchitis   . Arthritis   . Dysphagia   . H/O hiatal hernia   . Primary cancer of upper outer quadrant of left female breast ([place]) 12/01/2011   PCP:  Kathyrn Lass, MD Pharmacy:   [id number] - [place], [place] [address] Phone: [phone] Fax: [phone]     Social Determinants of Health (SDOH) Interventions    Readmission Risk Interventions No flowsheet data found.

## 2018-11-01 LAB — CBC WITH DIFFERENTIAL/PLATELET
Abs Immature Granulocytes: 0.08 10*3/uL — ABNORMAL HIGH (ref 0.00–0.07)
Basophils Absolute: 0 10*3/uL (ref 0.0–0.1)
Basophils Relative: 0 %
Eosinophils Absolute: 0.3 10*3/uL (ref 0.0–0.5)
Eosinophils Relative: 3 %
HCT: 25.6 % — ABNORMAL LOW (ref 36.0–46.0)
Hemoglobin: 7.9 g/dL — ABNORMAL LOW (ref 12.0–15.0)
Immature Granulocytes: 1 %
Lymphocytes Relative: 22 %
Lymphs Abs: 2 10*3/uL (ref 0.7–4.0)
MCH: 30 pg (ref 26.0–34.0)
MCHC: 30.9 g/dL (ref 30.0–36.0)
MCV: 97.3 fL (ref 80.0–100.0)
Monocytes Absolute: 0.8 10*3/uL (ref 0.1–1.0)
Monocytes Relative: 9 %
Neutro Abs: 5.7 10*3/uL (ref 1.7–7.7)
Neutrophils Relative %: 65 %
Platelets: 303 10*3/uL (ref 150–400)
RBC: 2.63 MIL/uL — ABNORMAL LOW (ref 3.87–5.11)
RDW: 14.4 % (ref 11.5–15.5)
WBC: 8.9 10*3/uL (ref 4.0–10.5)
nRBC: 0 % (ref 0.0–0.2)

## 2018-11-01 LAB — GLUCOSE, CAPILLARY: Glucose-Capillary: 86 mg/dL (ref 70–99)

## 2018-11-01 MED ORDER — FERROUS SULFATE 325 (65 FE) MG PO TABS: 325.0000 mg | ORAL_TABLET | Freq: Two times a day (BID) | ORAL | 0 refills | Status: DC

## 2018-11-01 MED ORDER — PHENOL 1.4 % MT LIQD: 1.0000 | OROMUCOSAL | 0 refills | Status: DC | PRN

## 2018-11-01 MED ORDER — AMOXICILLIN-POT CLAVULANATE 875-125 MG PO TABS: 1.0000 | ORAL_TABLET | Freq: Two times a day (BID) | ORAL | 0 refills | Status: AC

## 2018-11-01 MED ORDER — ALBUTEROL SULFATE HFA 108 (90 BASE) MCG/ACT IN AERS: 2.0000 | INHALATION_SPRAY | Freq: Four times a day (QID) | RESPIRATORY_TRACT | 0 refills | Status: DC

## 2018-11-01 MED ORDER — CITALOPRAM HYDROBROMIDE 20 MG PO TABS: 20.0000 mg | ORAL_TABLET | Freq: Every day | ORAL | 0 refills | Status: DC

## 2018-11-01 NOTE — Progress Notes (Signed)
MBS 4/27    10/31/18 1200  SLP Visit Information  SLP Received On 10/31/18  Subjective  Subjective alert upright in chair for procedure  Pain Assessment  Pain Assessment No/denies pain  General Information  Date of Onset 10/29/18  HPI 85 y.o. female with medical history significant of HTN; HLD; mild dementia; mild-moderate AS; and breast CA presenting with SOB.  She was admitted with a hip fracture from 4/20-22 and discharged home with daughter. CT of chest 4/25 signficant for "the distal esophagus is prominent. Underlying mass is not excluded, ill-defined patchy opacity in the left upper lobe  Type of Study MBS-Modified Barium Swallow Study  Previous Swallow Assessment none on file  Diet Prior to this Study Dysphagia 3 (soft);Thin liquids  Temperature Spikes Noted Yes  Respiratory Status Nasal cannula  History of Recent Intubation No  Behavior/Cognition Alert;Cooperative;Requires cueing  Oral Cavity Assessment WFL  Oral Cavity - Dentition Edentulous  Vision Functional for self feeding  Self-Feeding Abilities Needs assist  Patient Positioning Upright in chair  Baseline Vocal Quality Aphonic  Volitional Cough Weak  Volitional Swallow Unable to elicit  Pharyngeal Secretions Not observed secondary MBS  Oral Assessment (Complete on admission/transfer/change in patient condition)  Does patient have any of the following "high(er) risk" factors? None of the above  Does patient have any of the following "at risk" factors? Oxygen therapy - cannula, mask, simple oxygen devices  Patient is AT RISK Order set for Adult Oral Care Protocol initiated -  "At Risk Patients" option selected (see row information)  Oral Motor/Sensory Function  Overall Oral Motor/Sensory Function Generalized oral weakness  Oral Preparation/Oral Phase  Oral Phase Impaired  Oral - Nectar  Oral - Nectar Cup Delayed oral transit  Oral - Thin  Oral - Thin Cup WFL  Oral - Thin Straw WFL  Oral - Solids  Oral - Puree  Delayed oral transit  Oral - Regular Delayed oral transit;Lingual/palatal residue  Oral - Pill Delayed oral transit  Pharyngeal Phase  Pharyngeal Phase Impaired  Pharyngeal - Nectar  Pharyngeal- Nectar Cup Reduced tongue base retraction;Pharyngeal residue - valleculae  Pharyngeal Material does not enter airway  Pharyngeal - Thin  Pharyngeal- Thin Cup Delayed swallow initiation-pyriform sinuses;Reduced tongue base retraction;Reduced anterior laryngeal mobility;Pharyngeal residue - pyriform;Pharyngeal residue - valleculae  Pharyngeal Material does not enter airway  Pharyngeal- Thin Straw Penetration/Aspiration before swallow;Reduced epiglottic inversion;Pharyngeal residue - valleculae;Pharyngeal residue - pyriform  Pharyngeal Material enters airway, remains ABOVE vocal cords then ejected out  Pharyngeal - Solids  Pharyngeal- Puree Reduced tongue base retraction;Pharyngeal residue - valleculae  Pharyngeal Material does not enter airway  Pharyngeal- Regular Pharyngeal residue - valleculae;Reduced tongue base retraction  Pharyngeal Material does not enter airway  Pharyngeal- Pill Delayed swallow initiation-pyriform sinuses;Pharyngeal residue - valleculae  Pharyngeal Material does not enter airway  Cervical Esophageal Phase  Cervical Esophageal Phase Impaired  Cervical Esophageal Phase - Nectar  Nectar Cup Reduced cricopharyngeal relaxation  Cervical Esophageal Phase - Thin  Thin Cup Reduced cricopharyngeal relaxation  Cervical Esophageal Phase - Solids  Puree Reduced cricopharyngeal relaxation  Mechanical Soft Reduced cricopharyngeal relaxation  Regular Reduced cricopharyngeal relaxation  Pill Reduced cricopharyngeal relaxation  Clinical Impression  Clinical Impression A mild oropharyngeal dysphagia was exhibited. Oral deficits c/b prolonged mastication of solid PO in setting of edentulism and delayed oral transit with mild oral residuals. Pharyngeal deficits c/b decreased base of tongue  retraction, decreased laryngeal elevation, reduced UES opening, and decreased laryngeal vestibule closure resulting in: one instance of transient penetration  of thin liquids via straw sip. No aspiration was evidenced. Mild vallecular and pyriform sinus residuals were exhibited that cleared wtih a second volitional swallow.   SLP Visit Diagnosis Dysphagia, oropharyngeal phase (R13.12)  Impact on safety and function Mild aspiration risk  Swallow Evaluation Recommendations  SLP Diet Recommendations Dysphagia 3 (Mech soft) solids;Thin liquid  Liquid Administration via Cup  Medication Administration Whole meds with puree  Supervision Patient able to self feed;Full supervision/cueing for compensatory strategies  Compensations Minimize environmental distractions;Slow rate;Small sips/bites  Postural Changes Remain semi-upright after after feeds/meals (Comment);Seated upright at 90 degrees  Treatment Plan  Oral Care Recommendations Oral care BID  Treatment Recommendations Therapy as outlined in treatment plan below  Follow up Recommendations 24 hour supervision/assistance  Speech Therapy Frequency (ACUTE ONLY) min 1 x/week  Treatment Duration 1 week  Interventions Aspiration precaution training;Patient/family education;Diet toleration management by SLP  Prognosis  Prognosis for Safe Diet Advancement Good  Individuals Consulted  Consulted and Agree with Results and Recommendations Patient  Progression Toward Goals  Progression toward goals Progressing toward goals  SLP Time Calculation  SLP Start Time (ACUTE ONLY) 1200  SLP Stop Time (ACUTE ONLY) 1212  SLP Time Calculation (min) (ACUTE ONLY) 12 min  SLP Evaluations  $ SLP Speech Visit 1 Visit  SLP Evaluations  $MBS Swallow 1 Procedure    MBS performed by Jannette Spanner, SLP

## 2018-11-01 NOTE — TOC Transition Note (Signed)
Transition of Care [facility]) - CM/SW Discharge Note   Patient Details  Name: Brandy [name] MRN: [id number] Date of Birth: 1933/01/30  Transition of Care [facility]) CM/SW Contact:  Zenon Mayo, RN Phone Number: 11/01/2018, 9:35 AM   Clinical Narrative:    Patient for dc home, NCM contacted Butch Penny with [facility] to notify of discharge and to add RN and OT to services.  Butch Penny stated they will add this to services.  No other needs     Barriers to Discharge: No Barriers Identified   Patient Goals and CMS Choice Patient states their goals for this hospitalization and ongoing recovery are:: to go back home      Discharge Placement                       Discharge Plan and Services   Discharge Planning Services: CM Consult Post Acute Care Choice: Resumption of Svcs/PTA Provider                    HH Arranged: RN, PT, OT [facility] Agency: [place] ([place]) Date HH Agency Contacted: 11/01/18 Time [place]: [phone] Representative spoke with at [place]: Butch Penny with [facility] notified  Social Determinants of Health ([place]) Interventions     Readmission Risk Interventions No flowsheet data found.

## 2018-11-01 NOTE — Discharge Summary (Signed)
Physician Discharge Summary  Brandy [name] GBT:[id number] DOB: 02-06-1933 DOA: 10/29/2018  PCP: Kathyrn Lass, MD  Admit date: 10/29/2018 Discharge date: 11/01/2018  Admitted From: Home  Disposition: Home   Recommendations for Outpatient Follow-up:  1. Follow up with PCP in 1-2 weeks 2. Please obtain BMP/CBC in one week 3. Needs to follow up with Dr allusion for post op  4. Needs cbc to follow hb.  5. Resume BP medications if BP increases, repeat renal function prior to resumption medications.  6. Follow up with GI for abnormal esophagus finding seen on recent CT scan.   Home Health: yes.   Discharge Condition: Stable.  CODE STATUS: DNR Diet recommendation: Heart Healthy dysphagia 3 diet   Brief/Interim Summary: 85 year old with past medical history significant for hypertension, hyperlipidemia, mild dementia, mild to moderate aortic stenosis, breast cancer, who presents complaining of shortness of breath.  Patient was admitted with a hip fracture from 4/20-4/22 and discharged home with daughter.  Patient was doing well.  Over the last couple of days patient started to have worsening cough, wheezing and patient was noted to be short of breath.  At the urgent care patient was found to be febrile.  Oxygen saturation was 80% she was a started on oxygen supplementation.  COVID test time 2 negative.    1-Acute hypoxic respiratory failure; secondary to pneumonia -Patient presented with shortness of breath, cough fever. -She Is requiring 2 L of oxygen supplementation. Pertinent labs: Triglyceride 295, LDH 207, ferritin 121, CRP 8--- 14.  Procalcitonin less than 0.1, d-dimer 2.9, fibrinogen 596. CT angio:The distal esophagus is prominent. Underlying mass is not excluded. Consider esophagram or upper endoscopy to further characterize. There is an ill-defined patchy opacity in the left upper lobe measuring up to 1.8 cm. There are also patchy opacities at the left base. These findings may reflect an  inflammatory process at the lung base and post radiation fibrosis in the left upper lobe. Underlying malignancy is not excluded. Follow-up CT chest in 3 months to ensure resolution. -Send out test for COVID negative.   Rapid COVID 19 test negative -CRP and D dimer elevated.  -Treating for Bacterial PNA.  -Treated with vancomycin and cefepime MRSA PCR negative.  receive 3 days of IV antibiotics. Discharge on 4 days of Augmentin.  Hypoxemia resolved.  Stable for discharge.   Acute renal failure; suspect prerenal' Improved with IV fluids. NSL/  Continue to hold hydrochlorothiazide and losartan at discharge.  Improved. .  Bilateral lower extremity edema worse on the right: Doppler LE negative for DVT>   HTN; continue to hold blood pressure medications: Valsartan, hydrochlorothiazide, amlodipine, spironolactone.  Aortic stenosis: Mild to moderate by echo 2018.  Follow-up as an outpatient.  Recent hip Fracture;  She was on aspirin twice daily for DVT prophylaxis. We will continue while she is in-house with Lovenox. PT consulted. resume PT at discharge.   Esophageal prominent on CT: Patient reports that she has had in the past endoscopy to evaluate for dysphagia. Esophagogram; No gross esophageal abnormality on this limited single contrast study. Specifically, no evidence for mass lesion in the distal esophagus. Nonspecific esophageal motility disorderSmall hiatal hernia. Continue with PPI Speech therapy recommendation;   Dysphagia 3 (Mech soft) solids;Thin liquid   Liquid Administration via Cup  Medication Administration Whole meds with puree  Compensations Minimize environmental distractions;Slow rate;Small sips/bites  Postural Changes Remain semi-upright after after feeds/meals (Comment);Seated upright at 90 degrees    Anemia;  No evidence of active bleeding.  Hb  stable.  Iron deficiency, started  ferrous sulfate.  needs close follow up.     Discharge Diagnoses:   Principal Problem:   Acute respiratory failure with hypoxia ([place]) Active Problems:   Hyperlipidemia   Essential hypertension   Acute renal failure (ARF) ([facility])   S/P right hip fracture    Discharge Instructions  Discharge Instructions    Diet - low sodium heart healthy   Complete by:  As directed    Increase activity slowly   Complete by:  As directed      Allergies as of 11/01/2018   No Known Allergies     Medication List    STOP taking these medications   amLODipine 10 MG tablet Commonly known as:  NORVASC   spironolactone 25 MG tablet Commonly known as:  ALDACTONE   valsartan-hydrochlorothiazide 320-25 MG tablet Commonly known as:  DIOVAN-HCT     TAKE these medications   acetaminophen 500 MG tablet Commonly known as:  TYLENOL Take 1,000 mg by mouth every 6 (six) hours as needed (pain).   albuterol 108 (90 Base) MCG/ACT inhaler Commonly known as:  VENTOLIN HFA Inhale 2 puffs into the lungs every 6 (six) hours.   amoxicillin-clavulanate 875-125 MG tablet Commonly known as:  Augmentin Take 1 tablet by mouth 2 (two) times daily for 4 days.   aspirin EC 325 MG tablet Take 1 tablet (325 mg total) by mouth 2 (two) times daily for 21 days. Then take one 81 mg aspirin once a day for three weeks. Then discontinue aspirin. What changed:    when to take this  additional instructions   atorvastatin 40 MG tablet Commonly known as:  LIPITOR Take 40 mg by mouth daily.   citalopram 20 MG tablet Commonly known as:  CELEXA Take 1 tablet (20 mg total) by mouth daily. What changed:    medication strength  how much to take   docusate sodium 100 MG capsule Commonly known as:  COLACE Take 1 capsule (100 mg total) by mouth daily. What changed:    when to take this  reasons to take this   ferrous sulfate 325 (65 FE) MG tablet Take 1 tablet (325 mg total) by mouth 2 (two) times daily with a meal. What changed:  when to take this   gabapentin 300 MG  capsule Commonly known as:  NEURONTIN Take 1 capsule (300 mg total) by mouth at bedtime.   HYDROcodone-acetaminophen 5-325 MG tablet Commonly known as:  NORCO/VICODIN Take 1 tablet by mouth every 6 (six) hours as needed for severe pain.   methocarbamol 500 MG tablet Commonly known as:  ROBAXIN Take 1 tablet (500 mg total) by mouth every 6 (six) hours as needed for muscle spasms.   multivitamin with minerals Tabs tablet Take 1 tablet by mouth daily.   omeprazole 40 MG capsule Commonly known as:  PRILOSEC TAKE 1 CAPSULE (40 MG TOTAL) BY MOUTH 2 (TWO) TIMES DAILY BEFORE A MEAL.   phenol 1.4 % Liqd Commonly known as:  CHLORASEPTIC Use as directed 1 spray in the mouth or throat as needed for throat irritation / pain.   traMADol 50 MG tablet Commonly known as:  ULTRAM Take 100 mg by mouth 4 (four) times daily.   VITAMIN B-12 PO Take 1 tablet by mouth daily.   Vitamin D (Ergocalciferol) 1.25 MG (50000 UT) Caps capsule Commonly known as:  DRISDOL Take 50,000 Units by mouth every Thursday.      Follow-up Information    Kathyrn Lass, MD  Follow up in 1 week(s).   Specialty:  Family Medicine Contact information: [place] [address] [phone]        Gaynelle Arabian, MD Follow up.   Specialty:  Orthopedic Surgery Why:  keep your appointment.  Contact information: [address] [place] [address] [phone]        [facility] Follow up.   Specialty:  Home Health Services Why:  RN, PT, OT         No Known Allergies  Consultations:  none   Procedures/Studies: Dg Chest 1 View  Result Date: 10/24/2018 CLINICAL DATA:  85 year old female with a fall and hip pain EXAM: CHEST  1 VIEW COMPARISON:  11/19/2016 FINDINGS: Cardiomediastinal silhouette unchanged in size and contour. Low lung volumes with linear opacities at the lung bases. No pneumothorax or pleural effusion. No confluent airspace disease.  Changes of prior left shoulder arthroplasty. Degenerative changes of the right shoulder. IMPRESSION: Chronic changes without evidence of acute cardiopulmonary disease Electronically Signed   By: Corrie Mckusick D.O.   On: 10/24/2018 16:37   Ct Head Wo Contrast  Result Date: 10/24/2018 CLINICAL DATA:  Fall 3-4 days ago.  Hit right-side of head. EXAM: CT HEAD WITHOUT CONTRAST TECHNIQUE: Contiguous axial images were obtained from the base of the skull through the vertex without intravenous contrast. COMPARISON:  CT head dated August 21, 2015. FINDINGS: Brain: No evidence of acute infarction, hemorrhage, hydrocephalus, extra-axial collection or mass lesion/mass effect. Stable moderate atrophy and chronic microvascular ischemic changes. Vascular: Calcified atherosclerosis at the skullbase. No hyperdense vessel. Skull: Negative for fracture or focal lesion. Old left zygomatic arch fracture. Sinuses/Orbits: No acute finding. Other: None. IMPRESSION: 1. No acute intracranial abnormality. Stable moderate atrophy and chronic microvascular ischemic changes. Electronically Signed   By: Titus Dubin M.D.   On: 10/24/2018 17:49   Ct Angio Chest Pe W/cm &/or Wo Cm  Result Date: 10/29/2018 CLINICAL DATA:  Short of breath and hypoxia.  Recent hip surgery. EXAM: CT ANGIOGRAPHY CHEST WITH CONTRAST TECHNIQUE: Multidetector CT imaging of the chest was performed using the standard protocol during bolus administration of intravenous contrast. Multiplanar CT image reconstructions and MIPs were obtained to evaluate the vascular anatomy. CONTRAST:  57mL OMNIPAQUE IOHEXOL 350 MG/ML SOLN COMPARISON:  PET-CT 01/04/2012 FINDINGS: Cardiovascular: There are no filling defects in the pulmonary arterial tree to suggest acute pulmonary thromboembolism. There is no obvious evidence of aortic aneurysm or aortic dissection. Atherosclerotic vascular calcifications are noted. Mediastinum/Nodes: No abnormal mediastinal adenopathy. No pericardial  effusion. Visualized thyroid is unremarkable. The distal esophagus is prominent. On image 98, a large soft tissue density is located in the location of the esophagus. Underlying mass is not excluded. Lungs/Pleura: There is a patchy opacity in the anterior left upper lobe measuring up to 1.8 cm. There are linear opacities extending into the adjacent lung parenchyma. There are lucencies within the patchy the opacity. There is occlusion of small bronchioles in the medial right lung base. There are patchy opacities at the left lung base on images 72 and 76 of series 6. Dependent atelectasis bilaterally. Bilateral lower lobe bronchial wall thickening. Upper Abdomen: No acute abnormality. Musculoskeletal: No vertebral compression deformity. Review of the MIP images confirms the above findings. IMPRESSION: There is no evidence of acute pulmonary thromboembolism The distal esophagus is prominent. Underlying mass is not excluded. Consider esophagram or upper endoscopy to further characterize. There is an ill-defined patchy opacity in the left upper lobe measuring up  to 1.8 cm. There are also patchy opacities at the left base. These findings may reflect an inflammatory process at the lung base and post radiation fibrosis in the left upper lobe. Underlying malignancy is not excluded. Follow-up CT chest in 3 months to ensure resolution. Bilateral bronchial wall thickening and opacification of right basilar bronchials. These findings are compatible with mucoid impaction and an inflammatory process. Aortic Atherosclerosis (ICD10-I70.0). Electronically Signed   By: Marybelle Killings M.D.   On: 10/29/2018 17:22   Dg Chest Port 1 View  Result Date: 10/29/2018 CLINICAL DATA:  Short of breath EXAM: PORTABLE CHEST 1 VIEW COMPARISON:  10/24/2018 FINDINGS: Normal heart size. Lungs are under aerated. Hazy left perihilar pulmonary opacities. No pneumothorax or pleural effusion. IMPRESSION: Hazy left perihilar airspace opacities. Followup PA  and lateral chest X-ray is recommended in 3-4 weeks following trial of antibiotic therapy to ensure resolution and exclude underlying malignancy. Electronically Signed   By: Marybelle Killings M.D.   On: 10/29/2018 13:47   Dg Swallowing Func-speech Pathology  Result Date: 10/31/2018 Objective Swallowing Evaluation: Type of Study: MBS-Modified Barium Swallow Study  Patient Details Name: ZIAH TURVEY MRN: [id number] Date of Birth: [birth year] Today's Date: 10/31/2018 Time: SLP Start Time (ACUTE ONLY): 1200 -SLP Stop Time (ACUTE ONLY): 1212 SLP Time Calculation (min) (ACUTE ONLY): 12 min Past Medical History: Past Medical History: Diagnosis Date . Anemia  . Arthritis   shoulders . Asthma   mild, no inhalers used . Breast cancer ([place]) 12/18/11  left breast masectomy=metastatic ca in (1/1) lymph node ,invasive ductal ca,2 foci,,dcis,lymph ovascular invasion identified,surgical resection margins neg for ca,additional tissue=benign skin and subcutaneous tissue . Bronchitis   hx of;last time >15yr ago . Depression   takes Celexa daily . Difficult intubation  . Dysphagia  . Full dentures  . Gall stones 2015 . GERD (gastroesophageal reflux disease)   takes Prilosec prn . H/O hiatal hernia  . History of kidney stones   many yrs ago . Hx of radiation therapy 04/26/12 -06/10/12  left breast . Hyperlipidemia   takes Simvastatin daily . Hypertension   takes Amlodipine and Diovan daily . Insomnia   takes Ambien nightly . Urinary urgency  Past Surgical History: Past Surgical History: Procedure Laterality Date . APPENDECTOMY   . bladder tack   . BREAST BIOPSY  1998  left  . DILATION AND CURETTAGE OF UTERUS   . ENDOSCOPIC RETROGRADE CHOLANGIOPANCREATOGRAPHY (ERCP) WITH PROPOFOL N/A 02/01/2014  Procedure: ENDOSCOPIC RETROGRADE CHOLANGIOPANCREATOGRAPHY (ERCP) WITH PROPOFOL;  Surgeon: [name], MD;  Location: [facility];  Service: Endoscopy;  Laterality: N/A; . ESOPHAGOGASTRODUODENOSCOPY   . ESOPHAGOGASTRODUODENOSCOPY N/A 06/26/2016   Procedure: ESOPHAGOGASTRODUODENOSCOPY (EGD);  Surgeon: [name], MD;  Location: [facility];  Service: Endoscopy;  Laterality: N/A; . ESOPHAGOGASTRODUODENOSCOPY (EGD) WITH PROPOFOL  12/19/2013  Procedure: ESOPHAGOGASTRODUODENOSCOPY (EGD) WITH PROPOFOL;  Surgeon: Beryle Beams, MD;  Location: [facility];  Service: Endoscopy;; . ESOPHAGOGASTRODUODENOSCOPY (EGD) WITH PROPOFOL N/A 09/10/2016  Procedure: ESOPHAGOGASTRODUODENOSCOPY (EGD) WITH PROPOFOL;  Surgeon: [name], MD;  Location: [facility];  Service: Endoscopy;  Laterality: N/A; . EUS  06/04/2011  Procedure: UPPER ENDOSCOPIC ULTRASOUND (EUS) LINEAR;  Surgeon: Owens Loffler, MD;  Location: [facility];  Service: Endoscopy;  Laterality: N/A; . EUS N/A 02/01/2014  Procedure: UPPER ENDOSCOPIC ULTRASOUND (EUS) LINEAR;  Surgeon: [name], MD;  Location: [facility];  Service: Endoscopy;  Laterality: N/A; . EXPLORATORY LAPAROTOMY     biopsy of intra-abdominal mass . INTRAMEDULLARY (IM) NAIL INTERTROCHANTERIC Right 10/24/2018  Procedure:  INTRAMEDULLARY (IM) NAIL INTERTROCHANTRIC;  Surgeon: Gaynelle Arabian, MD;  Location: [facility];  Service: Orthopedics;  Laterality: Right; . LAPAROTOMY N/A 11/17/2016  Procedure: EXPLORATORY LAPAROTOMY WITH LYSIS OF ADHESIONS, SMALL BOWEL RESECTION, REPAIR OF INCARCERATED VENTRAL INCISIONAL HERNIA, INSERTION OF BIOLOGIC MESH PATCH,APPLICATION OF WOUND VAC DRESSING;  Surgeon: Armandina Gemma, MD;  Location: [facility];  Service: General;  Laterality: N/A; . MASTECTOMY W/ SENTINEL NODE BIOPSY  12/18/2011  Procedure: MASTECTOMY WITH SENTINEL LYMPH NODE BIOPSY;  Surgeon: Rolm Bookbinder, MD;  Location: [facility];  Service: General;  Laterality: Left; . PORT-A-CATH REMOVAL Right 06/15/2013  Procedure: REMOVAL PORT-A-CATH;  Surgeon: Rolm Bookbinder, MD;  Location: [facility];  Service: General;  Laterality: Right; . PORTACATH PLACEMENT  01/27/2012  Procedure: INSERTION PORT-A-CATH;  Surgeon: Rolm Bookbinder, MD;   Location: [facility];  Service: General;  Laterality: Right;  PORT PLACEMENT . TOTAL SHOULDER REPLACEMENT  2011  left . TUBAL LIGATION   HPI: 85 y.o. female with medical history significant of HTN; HLD; mild dementia; mild-moderate AS; and breast CA presenting with SOB.  She was admitted with a hip fracture from 4/20-22 and discharged home with daughter. CT of chest 4/25 signficant for "the distal esophagus is prominent. Underlying mass is not excluded, ill-defined patchy opacity in the left upper lobe  Subjective: alert upright in chair for procedure Assessment / Plan / Recommendation CHL IP CLINICAL IMPRESSIONS 10/31/2018 Clinical Impression A mild oropharyngeal dysphagia was exhibited. Oral deficits c/b prolonged mastication of solid PO in setting of edentulism and delayed oral transit with mild oral residuals. Pharyngeal deficits c/b decreased base of tongue retraction, decreased laryngeal elevation, reduced UES opening, and decreased laryngeal vestibule closure resulting in: one instance of transient penetration of thin liquids via straw sip. No aspiration was evidenced. Mild vallecular and pyriform sinus residuals were exhibited that cleared wtih a second volitional swallow.  SLP Visit Diagnosis Dysphagia, oropharyngeal phase (R13.12) Attention and concentration deficit following -- Frontal lobe and executive function deficit following -- Impact on safety and function Mild aspiration risk   CHL IP TREATMENT RECOMMENDATION 10/31/2018 Treatment Recommendations Therapy as outlined in treatment plan below   Prognosis 10/31/2018 Prognosis for Safe Diet Advancement Good Barriers to Reach Goals -- Barriers/Prognosis Comment -- CHL IP DIET RECOMMENDATION 10/31/2018 SLP Diet Recommendations Dysphagia 3 (Mech soft) solids;Thin liquid Liquid Administration via Cup Medication Administration Whole meds with puree Compensations Minimize environmental distractions;Slow rate;Small sips/bites Postural Changes Remain  semi-upright after after feeds/meals (Comment);Seated upright at 90 degrees   CHL IP OTHER RECOMMENDATIONS 10/31/2018 Recommended Consults -- Oral Care Recommendations Oral care BID Other Recommendations --   CHL IP FOLLOW UP RECOMMENDATIONS 10/31/2018 Follow up Recommendations 24 hour supervision/assistance   CHL IP FREQUENCY AND DURATION 10/31/2018 Speech Therapy Frequency (ACUTE ONLY) min 1 x/week Treatment Duration 1 week      CHL IP ORAL PHASE 10/31/2018 Oral Phase Impaired Oral - Pudding Teaspoon -- Oral - Pudding Cup -- Oral - Honey Teaspoon -- Oral - Honey Cup -- Oral - Nectar Teaspoon -- Oral - Nectar Cup Delayed oral transit Oral - Nectar Straw -- Oral - Thin Teaspoon -- Oral - Thin Cup WFL Oral - Thin Straw WFL Oral - Puree Delayed oral transit Oral - Mech Soft -- Oral - Regular Delayed oral transit;Lingual/palatal residue Oral - Multi-Consistency -- Oral - Pill Delayed oral transit Oral Phase - Comment --  CHL IP PHARYNGEAL PHASE 10/31/2018 Pharyngeal Phase Impaired Pharyngeal- Pudding Teaspoon -- Pharyngeal -- Pharyngeal- Pudding Cup -- Pharyngeal --  Pharyngeal- Honey Teaspoon -- Pharyngeal -- Pharyngeal- Honey Cup -- Pharyngeal -- Pharyngeal- Nectar Teaspoon -- Pharyngeal -- Pharyngeal- Nectar Cup Reduced tongue base retraction;Pharyngeal residue - valleculae Pharyngeal Material does not enter airway Pharyngeal- Nectar Straw -- Pharyngeal -- Pharyngeal- Thin Teaspoon -- Pharyngeal -- Pharyngeal- Thin Cup Delayed swallow initiation-pyriform sinuses;Reduced tongue base retraction;Reduced anterior laryngeal mobility;Pharyngeal residue - pyriform;Pharyngeal residue - valleculae Pharyngeal Material does not enter airway Pharyngeal- Thin Straw Penetration/Aspiration before swallow;Reduced epiglottic inversion;Pharyngeal residue - valleculae;Pharyngeal residue - pyriform Pharyngeal Material enters airway, remains ABOVE vocal cords then ejected out Pharyngeal- Puree Reduced tongue base retraction;Pharyngeal residue  - valleculae Pharyngeal Material does not enter airway Pharyngeal- Mechanical Soft -- Pharyngeal -- Pharyngeal- Regular Pharyngeal residue - valleculae;Reduced tongue base retraction Pharyngeal Material does not enter airway Pharyngeal- Multi-consistency -- Pharyngeal -- Pharyngeal- Pill Delayed swallow initiation-pyriform sinuses;Pharyngeal residue - valleculae Pharyngeal Material does not enter airway Pharyngeal Comment --  CHL IP CERVICAL ESOPHAGEAL PHASE 10/31/2018 Cervical Esophageal Phase Impaired Pudding Teaspoon -- Pudding Cup -- Honey Teaspoon -- Honey Cup -- Nectar Teaspoon -- Nectar Cup Reduced cricopharyngeal relaxation Nectar Straw -- Thin Teaspoon -- Thin Cup Reduced cricopharyngeal relaxation Thin Straw -- Puree Reduced cricopharyngeal relaxation Mechanical Soft Reduced cricopharyngeal relaxation Regular Reduced cricopharyngeal relaxation Multi-consistency -- Pill Reduced cricopharyngeal relaxation Cervical Esophageal Comment -- Chelsea E Hartness MA, CCC-SLP 10/31/2018, 1:04 PM              Dg C-arm 1-60 Min-no Report  Result Date: 10/24/2018 Fluoroscopy was utilized by the requesting physician.  No radiographic interpretation.   Dg Hip Operative Unilat W Or W/o Pelvis Right  Result Date: 10/24/2018 CLINICAL DATA:  Right IM nail. EXAM: OPERATIVE RIGHT HIP (WITH PELVIS IF PERFORMED) 5 VIEWS TECHNIQUE: Fluoroscopic spot image(s) were submitted for interpretation post-operatively. COMPARISON:  10/24/2018 FINDINGS: Multiple intraoperative spot images demonstrate internal fixation with IM nail across the right femoral neck fracture. Anatomic alignment. No hardware complicating feature. IMPRESSION: Internal fixation.  No complicating feature. Electronically Signed   By: Rolm Baptise M.D.   On: 10/24/2018 22:54   Dg Hip Unilat W Or Wo Pelvis 2-3 Views Right  Result Date: 10/24/2018 CLINICAL DATA:  85 year old who fell 3 days ago and complains of persistent RIGHT hip and RIGHT UPPER leg pain.  Initial encounter. EXAM: DG HIP (WITH OR WITHOUT PELVIS) 2-3V RIGHT COMPARISON:  Bone window images from CT abdomen and pelvis 11/15/2016. FINDINGS: Acute comminuted intertrochanteric RIGHT femoral neck fracture. RIGHT hip joint anatomically aligned with minimal to mild joint space narrowing. Symmetric minimal to mild joint space narrowing in the contralateral LEFT hip. Sacroiliac joints and symphysis pubis anatomically aligned without diastasis and demonstrating degenerative changes. Osseous demineralization. Degenerative changes involving the visualized LOWER lumbar spine. IMPRESSION: Acute comminuted intertrochanteric RIGHT femoral neck fracture. Electronically Signed   By: Evangeline Dakin M.D.   On: 10/24/2018 16:52   Dg Femur Min 2 Views Right  Result Date: 10/24/2018 CLINICAL DATA:  85 year old who fell 3 days ago and complains of persistent RIGHT hip and RIGHT UPPER leg pain. Initial encounter. EXAM: RIGHT FEMUR 2 VIEWS COMPARISON:  None. These images are read in conjunction with the RIGHT hip x-rays obtained concurrently. FINDINGS: The intertrochanteric RIGHT femoral neck fracture is not included on these images. No fractures elsewhere involving the RIGHT femur. Osseous demineralization. Severe tricompartment osteoarthritis involving the knee. IMPRESSION: 1. No fractures elsewhere involving the RIGHT femur. 2. Osseous demineralization. 3. Severe tricompartment osteoarthritis involving the knee. Electronically Signed   By: Sherran Needs.D.  On: 10/24/2018 16:53   Vas [place] Lower Extremity Venous (dvt)  Result Date: 10/31/2018  Lower Venous Study Indications: Edema.  Performing Technologist: Oliver Hum RVT  Examination Guidelines: A complete evaluation includes B-mode imaging, spectral Doppler, color Doppler, and power Doppler as needed of all accessible portions of each vessel. Bilateral testing is considered an integral part of a complete examination. Limited examinations for reoccurring  indications may be performed as noted.  +---------+---------------+---------+-----------+----------+-------+ RIGHT    CompressibilityPhasicitySpontaneityPropertiesSummary +---------+---------------+---------+-----------+----------+-------+ CFV      Full           Yes      Yes                          +---------+---------------+---------+-----------+----------+-------+ SFJ      Full                                                 +---------+---------------+---------+-----------+----------+-------+ FV Prox  Full                                                 +---------+---------------+---------+-----------+----------+-------+ FV Mid   Full                                                 +---------+---------------+---------+-----------+----------+-------+ FV DistalFull                                                 +---------+---------------+---------+-----------+----------+-------+ PFV      Full                                                 +---------+---------------+---------+-----------+----------+-------+ POP      Full           Yes      Yes                          +---------+---------------+---------+-----------+----------+-------+ PTV      Full                                                 +---------+---------------+---------+-----------+----------+-------+ PERO     Full                                                 +---------+---------------+---------+-----------+----------+-------+   +---------+---------------+---------+-----------+----------+-------+ LEFT     CompressibilityPhasicitySpontaneityPropertiesSummary +---------+---------------+---------+-----------+----------+-------+ CFV      Full           Yes      Yes                          +---------+---------------+---------+-----------+----------+-------+  SFJ      Full                                                  +---------+---------------+---------+-----------+----------+-------+ FV Prox  Full                                                 +---------+---------------+---------+-----------+----------+-------+ FV Mid   Full                                                 +---------+---------------+---------+-----------+----------+-------+ FV DistalFull                                                 +---------+---------------+---------+-----------+----------+-------+ PFV      Full                                                 +---------+---------------+---------+-----------+----------+-------+ POP      Full           Yes      Yes                          +---------+---------------+---------+-----------+----------+-------+ PTV      Full                                                 +---------+---------------+---------+-----------+----------+-------+ PERO     Full                                                 +---------+---------------+---------+-----------+----------+-------+     Summary: Right: There is no evidence of deep vein thrombosis in the lower extremity. No cystic structure found in the popliteal fossa. Left: There is no evidence of deep vein thrombosis in the lower extremity. No cystic structure found in the popliteal fossa.  *See table(s) above for measurements and observations. Electronically signed by Ruta Hinds MD on 10/31/2018 at 5:49:55 PM.    Final    Dg Esophagus W Single Cm (sol Or Thin Ba)  Result Date: 10/31/2018 CLINICAL DATA:  Difficulty swallowing. EXAM: ESOPHOGRAM/BARIUM SWALLOW TECHNIQUE: Single contrast examination was performed using  thin barium. FLUOROSCOPY TIME:  Fluoroscopy Time:  1 minutes and 6 seconds. Radiation Exposure Index (if provided by the fluoroscopic device): 28.1 mGy Number of Acquired Spot Images: COMPARISON:  CT scan 10/29/2018 FINDINGS: Evaluation limited by immobility and inability to tolerate upright positioning. No  evidence for esophageal mass lesion, diverticulum, stricture, or gross ulceration. Disruption of primary peristalsis is compatible with nonspecific esophageal  motility disorder. Tertiary contractions are observed in the mid and distal esophagus. Small hiatal hernia noted. No evidence for mass lesion in the distal esophagus. IMPRESSION: 1. No gross esophageal abnormality on this limited single contrast study. Specifically, no evidence for mass lesion in the distal esophagus. 2. Nonspecific esophageal motility disorder. 3. Small hiatal hernia. Electronically Signed   By: Misty Stanley M.D.   On: 10/31/2018 13:34     Subjective: Dyspnea improved. Cough improved.   Discharge Exam: Vitals:   10/31/18 2350 11/01/18 0747  BP: (!) 130/55 (!) 131/53  Pulse: 83 79  Resp:  18  Temp: 99.1 F (37.3 C) 98.1 F (36.7 C)  SpO2: 94% 95%     General: Pt is alert, awake, not in acute distress Cardiovascular: RRR, S1/S2 +, no rubs, no gallops Respiratory: CTA bilaterally, no wheezing, no rhonchi Abdominal: Soft, NT, ND, bowel sounds + Extremities: no edema, no cyanosis    The results of significant diagnostics from this hospitalization (including imaging, microbiology, ancillary and laboratory) are listed below for reference.     Microbiology: Recent Results (from the past 240 hour(s))  Surgical PCR screen     Status: None   Collection Time: 10/24/18  7:34 PM  Result Value Ref Range Status   MRSA, PCR NEGATIVE NEGATIVE Final   Staphylococcus aureus NEGATIVE NEGATIVE Final    Comment: (NOTE) The Xpert SA Assay (FDA approved for NASAL specimens in patients 14 years of age and older), is one component of a comprehensive surveillance program. It is not intended to diagnose infection nor to guide or monitor treatment. Performed at [facility], [place] [address] [place], [place] [address]   Blood Culture (routine x 2)     Status: None (Preliminary result)   Collection Time:  10/29/18  1:05 PM  Result Value Ref Range Status   Specimen Description BLOOD LEFT ARM  Final   Special Requests AEROBIC BOTTLE ONLY Blood Culture adequate volume  Final   Culture   Final    NO GROWTH 3 DAYS Performed at [facility], [address], [place], [place] [address]    Report Status PENDING  Incomplete  SARS Coronavirus 2 [facility] order, Performed in [place] hospital lab)     Status: None   Collection Time: 10/29/18  1:25 PM  Result Value Ref Range Status   SARS Coronavirus 2 NEGATIVE NEGATIVE Final    Comment: (NOTE) If result is NEGATIVE SARS-CoV-2 target nucleic acids are NOT DETECTED. The SARS-CoV-2 RNA is generally detectable in upper and lower  respiratory specimens during the acute phase of infection. The lowest  concentration of SARS-CoV-2 viral copies this assay can detect is 250  copies / mL. A negative result does not preclude SARS-CoV-2 infection  and should not be used as the sole basis for treatment or other  patient management decisions.  A negative result may occur with  improper specimen collection / handling, submission of specimen other  than nasopharyngeal swab, presence of viral mutation(s) within the  areas targeted by this assay, and inadequate number of viral copies  (<250 copies / mL). A negative result must be combined with clinical  observations, patient history, and epidemiological information. If result is POSITIVE SARS-CoV-2 target nucleic acids are DETECTED. The SARS-CoV-2 RNA is generally detectable in upper and lower  respiratory specimens dur ing the acute phase of infection.  Positive  results are indicative of active infection with SARS-CoV-2.  Clinical  correlation with patient history and other  diagnostic information is  necessary to determine patient infection status.  Positive results do  not rule out bacterial infection or co-infection with other viruses. If result is PRESUMPTIVE POSTIVE SARS-CoV-2 nucleic acids MAY  BE PRESENT.   A presumptive positive result was obtained on the submitted specimen  and confirmed on repeat testing.  While 2019 novel coronavirus  (SARS-CoV-2) nucleic acids may be present in the submitted sample  additional confirmatory testing may be necessary for epidemiological  and / or clinical management purposes  to differentiate between  SARS-CoV-2 and other Sarbecovirus currently known to infect humans.  If clinically indicated additional testing with an alternate test  methodology [phone]) is advised. The SARS-CoV-2 RNA is generally  detectable in upper and lower respiratory sp ecimens during the acute  phase of infection. The expected result is Negative. Fact Sheet for Patients:  StrictlyIdeas.no Fact Sheet for Healthcare Providers: BankingDealers.co.za This test is not yet approved or cleared by the [place] FDA and has been authorized for detection and/or diagnosis of SARS-CoV-2 by FDA under an Emergency Use Authorization (EUA).  This EUA will remain in effect (meaning this test can be used) for the duration of the COVID-19 declaration under Section 564(b)(1) of the Act, 21 U.S.C. section 360bbb-3(b)(1), unless the authorization is terminated or revoked sooner. Performed at [facility], [place] [address] [place], [place] [address]   Blood Culture (routine x 2)     Status: None (Preliminary result)   Collection Time: 10/29/18  1:30 PM  Result Value Ref Range Status   Specimen Description BLOOD RIGHT ANTECUBITAL  Final   Special Requests   Final    AEROBIC BOTTLE ONLY Blood Culture results may not be optimal due to an inadequate volume of blood received in culture bottles   Culture   Final    NO GROWTH 3 DAYS Performed at [facility], [place] [address], [place], [place] [address]    Report Status PENDING  Incomplete  Novel Coronavirus, NAA (hospital order; send-out to ref lab)     Status: None   Collection  Time: 10/29/18  5:08 PM  Result Value Ref Range Status   SARS-CoV-2, NAA NOT DETECTED NOT DETECTED Final    Comment: (NOTE) This test was developed and its performance characteristics determined by [employer]. This test has not been FDA cleared or approved. This test has been authorized by FDA under an Emergency Use Authorization (EUA). This test has been validated in accordance with the FDA's Guidance Document (Policy for [place] in Laboratories Certified to Perform High Complexity Testing under CLIA prior to Emergency Use Authorization for Coronavirus [id number] during the [facility] Emergency) issued on February 29th, 2020. FDA independent review of this validation is pending. This test is only authorized for the duration of time the declaration that circumstances exist justifying the authorization of the emergency use of in vitro diagnostic tests for detection of SARS-CoV- 2 virus and/or diagnosis of COVID-19 infection under section 564(b)(1) of the Act, 21 U.S.C. 683MHD-6(Q)(2), unless the authorization is terminated or revoked sooner. Performed At: [facility] [address] [place], [place] [id number] Rush Farmer MD HE:[id number]    [place]  Final    Comment: Performed at [facility], [place] [address] [place], [place] [address]  MRSA PCR Screening     Status: None   Collection Time: 10/31/18  9:00 PM  Result Value Ref Range Status   MRSA by PCR NEGATIVE NEGATIVE Final  Comment:        The GeneXpert MRSA Assay (FDA approved for NASAL specimens only), is one component of a comprehensive MRSA colonization surveillance program. It is not intended to diagnose MRSA infection nor to guide or monitor treatment for MRSA infections. Performed at [facility], [place] [address], [place], [place] [address]      Labs: BNP (last 3 results) No results for input(s): BNP in the last 8760 hours. Basic  Metabolic Panel: Recent Labs  Lab 10/26/18 0425 10/29/18 1340 10/30/18 0610 10/31/18 0645  NA 140 138 142 143  K 3.6 4.2 3.9 4.0  CL 108 103 111 111  CO2 24 22 23 23   GLUCOSE 109* 141* 110* 102*  BUN 20 56* 52* 37*  CREATININE 0.80 1.54* 1.36* 1.13*  CALCIUM 9.0 9.6 9.1 9.1  MG  --   --  2.0 2.0  PHOS  --   --  4.2 3.8   Liver Function Tests: Recent Labs  Lab 10/29/18 1340 10/30/18 0610  AST 25 14*  ALT 19 15  ALKPHOS 78 65  BILITOT 0.9 0.5  PROT 6.4* 5.5*  ALBUMIN 2.9* 2.4*   No results for input(s): LIPASE, AMYLASE in the last 168 hours. No results for input(s): AMMONIA in the last 168 hours. CBC: Recent Labs  Lab 10/26/18 0425 10/29/18 1340 10/30/18 0610 10/31/18 0645 11/01/18 0444  WBC 9.2 16.7* 11.8* 10.3 8.9  NEUTROABS  --  13.1* 8.9* 7.3 5.7  HGB 9.4* 9.0* 7.1* 7.9* 7.9*  HCT 30.6* 30.1* 22.9* 25.5* 25.6*  MCV 100.3* 100.7* 98.7 98.1 97.3  PLT 241 327 254 271 303   Cardiac Enzymes: No results for input(s): CKTOTAL, CKMB, CKMBINDEX, TROPONINI in the last 168 hours. BNP: Invalid input(s): POCBNP CBG: Recent Labs  Lab 10/31/18 0816 10/31/18 1648 10/31/18 2156 11/01/18 0743  GLUCAP 89 112* 126* 86   D-Dimer Recent Labs    10/30/18 0610 10/31/18 0645  DDIMER 2.99* 3.44*   Hgb A1c No results for input(s): HGBA1C in the last 72 hours. Lipid Profile No results for input(s): CHOL, HDL, LDLCALC, TRIG, CHOLHDL, LDLDIRECT in the last 72 hours. Thyroid function studies No results for input(s): TSH, T4TOTAL, T3FREE, THYROIDAB in the last 72 hours.  Invalid input(s): FREET3 Anemia work up Recent Labs    10/31/18 0645  VITAMINB12 929*  FOLATE 49.8  FERRITIN 245  TIBC 188*  IRON 22*  RETICCTPCT 2.5   Urinalysis    Component Value Date/Time   COLORURINE AMBER (A) 11/15/2016 2253   APPEARANCEUR CLOUDY (A) 11/15/2016 2253   LABSPEC 1.017 11/15/2016 2253   PHURINE 5.0 11/15/2016 2253   GLUCOSEU NEGATIVE 11/15/2016 2253   HGBUR NEGATIVE  11/15/2016 2253   BILIRUBINUR SMALL (A) 11/15/2016 2253   KETONESUR NEGATIVE 11/15/2016 2253   PROTEINUR NEGATIVE 11/15/2016 2253   UROBILINOGEN 1.0 10/10/2009 1100   NITRITE NEGATIVE 11/15/2016 2253   LEUKOCYTESUR NEGATIVE 11/15/2016 2253   Sepsis Labs Invalid input(s): PROCALCITONIN,  WBC,  LACTICIDVEN Microbiology Recent Results (from the past 240 hour(s))  Surgical PCR screen     Status: None   Collection Time: 10/24/18  7:34 PM  Result Value Ref Range Status   MRSA, PCR NEGATIVE NEGATIVE Final   Staphylococcus aureus NEGATIVE NEGATIVE Final    Comment: (NOTE) The Xpert SA Assay (FDA approved for NASAL specimens in patients 85 years of age and older), is one component of a comprehensive surveillance program. It is not intended to diagnose infection nor to guide or monitor  treatment. Performed at [facility], [place] [address], [place], [place] [address]   Blood Culture (routine x 2)     Status: None (Preliminary result)   Collection Time: 10/29/18  1:05 PM  Result Value Ref Range Status   Specimen Description BLOOD LEFT ARM  Final   Special Requests AEROBIC BOTTLE ONLY Blood Culture adequate volume  Final   Culture   Final    NO GROWTH 3 DAYS Performed at [facility], [address], [place], [place] [address]    Report Status PENDING  Incomplete  SARS Coronavirus 2 [facility] order, Performed in [place] hospital lab)     Status: None   Collection Time: 10/29/18  1:25 PM  Result Value Ref Range Status   SARS Coronavirus 2 NEGATIVE NEGATIVE Final    Comment: (NOTE) If result is NEGATIVE SARS-CoV-2 target nucleic acids are NOT DETECTED. The SARS-CoV-2 RNA is generally detectable in upper and lower  respiratory specimens during the acute phase of infection. The lowest  concentration of SARS-CoV-2 viral copies this assay can detect is 250  copies / mL. A negative result does not preclude SARS-CoV-2 infection  and should not be used as the sole  basis for treatment or other  patient management decisions.  A negative result may occur with  improper specimen collection / handling, submission of specimen other  than nasopharyngeal swab, presence of viral mutation(s) within the  areas targeted by this assay, and inadequate number of viral copies  (<250 copies / mL). A negative result must be combined with clinical  observations, patient history, and epidemiological information. If result is POSITIVE SARS-CoV-2 target nucleic acids are DETECTED. The SARS-CoV-2 RNA is generally detectable in upper and lower  respiratory specimens dur ing the acute phase of infection.  Positive  results are indicative of active infection with SARS-CoV-2.  Clinical  correlation with patient history and other diagnostic information is  necessary to determine patient infection status.  Positive results do  not rule out bacterial infection or co-infection with other viruses. If result is PRESUMPTIVE POSTIVE SARS-CoV-2 nucleic acids MAY BE PRESENT.   A presumptive positive result was obtained on the submitted specimen  and confirmed on repeat testing.  While 2019 novel coronavirus  (SARS-CoV-2) nucleic acids may be present in the submitted sample  additional confirmatory testing may be necessary for epidemiological  and / or clinical management purposes  to differentiate between  SARS-CoV-2 and other Sarbecovirus currently known to infect humans.  If clinically indicated additional testing with an alternate test  methodology [phone]) is advised. The SARS-CoV-2 RNA is generally  detectable in upper and lower respiratory sp ecimens during the acute  phase of infection. The expected result is Negative. Fact Sheet for Patients:  StrictlyIdeas.no Fact Sheet for Healthcare Providers: BankingDealers.co.za This test is not yet approved or cleared by the [place] FDA and has been authorized for detection  and/or diagnosis of SARS-CoV-2 by FDA under an Emergency Use Authorization (EUA).  This EUA will remain in effect (meaning this test can be used) for the duration of the COVID-19 declaration under Section 564(b)(1) of the Act, 21 U.S.C. section 360bbb-3(b)(1), unless the authorization is terminated or revoked sooner. Performed at [facility], [place] [address], [place], [place] [address]   Blood Culture (routine x 2)     Status: None (Preliminary result)   Collection Time: 10/29/18  1:30 PM  Result Value Ref Range Status   Specimen Description BLOOD RIGHT ANTECUBITAL  Final   Special Requests   Final    AEROBIC BOTTLE ONLY Blood Culture results may not be optimal due to an inadequate volume of blood received in culture bottles   Culture   Final    NO GROWTH 3 DAYS Performed at [facility], [place] [address] [place], [place] [address]    Report Status PENDING  Incomplete  Novel Coronavirus, NAA (hospital order; send-out to ref lab)     Status: None   Collection Time: 10/29/18  5:08 PM  Result Value Ref Range Status   SARS-CoV-2, NAA NOT DETECTED NOT DETECTED Final    Comment: (NOTE) This test was developed and its performance characteristics determined by [employer]. This test has not been FDA cleared or approved. This test has been authorized by FDA under an Emergency Use Authorization (EUA). This test has been validated in accordance with the FDA's Guidance Document (Policy for [place] in Laboratories Certified to Perform High Complexity Testing under CLIA prior to Emergency Use Authorization for Coronavirus [id number] during the [facility] Emergency) issued on February 29th, 2020. FDA independent review of this validation is pending. This test is only authorized for the duration of time the declaration that circumstances exist justifying the authorization of the emergency use of in vitro diagnostic tests for detection of SARS-CoV- 2 virus  and/or diagnosis of COVID-19 infection under section 564(b)(1) of the Act, 21 U.S.C. 258NID-7(O)(2), unless the authorization is terminated or revoked sooner. Performed At: [facility] [address] [place], [place] [id number] Rush Farmer MD [id number]    [place]  Final    Comment: Performed at [facility], [place] [address], [place], [place] [address]  MRSA PCR Screening     Status: None   Collection Time: 10/31/18  9:00 PM  Result Value Ref Range Status   MRSA by PCR NEGATIVE NEGATIVE Final    Comment:        The GeneXpert MRSA Assay (FDA approved for NASAL specimens only), is one component of a comprehensive MRSA colonization surveillance program. It is not intended to diagnose MRSA infection nor to guide or monitor treatment for MRSA infections. Performed at [facility], [place] [address], [place], [place] [address]      Time coordinating discharge: 40 minutes  SIGNED:   Elmarie Shiley, MD  [facility]

## 2018-11-01 NOTE — Progress Notes (Signed)
Pharmacy Antibiotic Note  Brandy [name] is a 85 y.o. female admitted on 10/29/2018 with pneumonia.  Pharmacy was consulted on 10/29/18 for cefepime/vanc dosing.  Tioday 4/28,  MD has discontinued the vancomycin.  Cefepime continues.  MRSA PCR: negative. WBC 16.7->10.3>8.9 Afeb, tm 99.1 Scr1.54->1.36>1.13 , CrCl 31 ml/min  Presented with SOB and cough - was recently admitted with hip fx. WBC 16.7, PCT 0.1, LA 1.3, CRP 8.8. Afebrile here but reported fever at urgent care center. CXR showing hazy L perihilar airspace opacities. COVID test neg. Received 1 g IV cefepime/vanc on 4/25. Scr 1.54 (CrCl 25 mL/min) - in AKI from Scr 0.8 on 4/22.  Plan:  Cefepime 2 g IV every 24 hours  Monitor renal fx, clinical pic, and cx results  Height: 4\' 11"  (149.9 cm) Weight: 145 lb 1 oz (65.8 kg) IBW/kg (Calculated) : 43.2  Temp (24hrs), Avg:98.7 F (37.1 C), Min:98.1 F (36.7 C), Max:99.1 F (37.3 C)  Recent Labs  Lab 10/26/18 0425 10/29/18 1340 10/30/18 0610 10/31/18 0645 11/01/18 0444  WBC 9.2 16.7* 11.8* 10.3 8.9  CREATININE 0.80 1.54* 1.36* 1.13*  --   LATICACIDVEN  --  1.3  --   --   --     Estimated Creatinine Clearance: 31.1 mL/min (A) (by C-G formula based on SCr of 1.13 mg/dL (H)).    No Known Allergies  Antimicrobials this admission: Vanc 4/25 >> 4/28 Cefepime 4/25 >>   Dose adjustments this admission: N/A  Microbiology results: 4/25 BCx: ngtd 4/25 COVID: neg, resent  4/27 MRSA PCR: neg  Thank you for allowing pharmacy to be a part of this patient's care.  Nicole Cella, RPh Clinical Pharmacist Phone: [phone] Please check AMION for all [place] phone numbers After 10:00 PM, call [place] [phone] 11/01/2018 9:22 AM

## 2018-11-01 NOTE — Progress Notes (Signed)
Given AVS discharge instructions to [employer], plan for pick up at 1130am.

## 2018-11-01 NOTE — Progress Notes (Signed)
Removed PIV, given AVS instructions to patient. Discharged via wheelchair to go home with daughter.

## 2018-11-01 NOTE — Progress Notes (Signed)
Physical Therapy Treatment Patient Details Name: Brandy [name] MRN: [id number] DOB: [birth year] Today's Date: 11/01/2018    History of Present Illness Pt adm with acute hypoxic respiratory failure. Covid negative. Pt with recent rt hip fx with ORIF on 4/20. PMH - rt hip fx with ORIF, htn, chf, lt shoulder replacement, breas CA, OA, depression    PT Comments    Pt making steady progress. Eager to return home with daughter.   Follow Up Recommendations  Home health PT;Supervision/Assistance - 24 hour     Equipment Recommendations  None recommended by PT    Recommendations for Other Services       Precautions / Restrictions Precautions Precautions: Fall Restrictions Weight Bearing Restrictions: Yes RLE Weight Bearing: Weight bearing as tolerated    Mobility  Bed Mobility Overal bed mobility: Needs Assistance Bed Mobility: Supine to Sit     Supine to sit: Min assist     General bed mobility comments: assist to move RLE and to elevate trunk into sitting  Transfers Overall transfer level: Needs assistance Equipment used: Rolling walker (2 wheeled) Transfers: Sit to/from [facility] Sit to Stand: Min assist Stand pivot transfers: Min assist       General transfer comment: Assist to bring hips up and for balance. Bed to bsc to bed with walker. Verbal cues for hand placement  Ambulation/Gait Ambulation/Gait assistance: Min assist Gait Distance (Feet): 15 Feet(15' x 1, 10' x 1) Assistive device: Rolling walker (2 wheeled) Gait Pattern/deviations: Step-to pattern;Decreased step length - right;Decreased step length - left;Antalgic Gait velocity: decr Gait velocity interpretation: <1.31 ft/sec, indicative of household ambulator General Gait Details: Assist for balance and support   Stairs             Wheelchair Mobility    Modified Rankin (Stroke Patients Only)       Balance Overall balance assessment: Needs assistance;History of  Falls Sitting-balance support: No upper extremity supported;Feet supported Sitting balance-Leahy Scale: Fair     Standing balance support: Bilateral upper extremity supported;During functional activity Standing balance-Leahy Scale: Poor Standing balance comment: walker and min assist for static standing                            Cognition Arousal/Alertness: Awake/alert Behavior During Therapy: WFL for tasks assessed/performed Overall Cognitive Status: Within Functional Limits for tasks assessed                                        Exercises      General Comments        Pertinent Vitals/Pain Pain Assessment: Faces Faces Pain Scale: Hurts little more Pain Location: rt hip Pain Descriptors / Indicators: Sore;Discomfort Pain Intervention(s): Limited activity within patient's tolerance;Monitored during session;Repositioned    Home Living                      Prior Function            PT Goals (current goals can now be found in the care plan section) Progress towards PT goals: Progressing toward goals    Frequency    Min 3X/week      PT Plan Current plan remains appropriate    Co-evaluation              AM-PAC PT "6 Clicks" Mobility   Outcome Measure  Help  needed turning from your back to your side while in a flat bed without using bedrails?: A Little Help needed moving from lying on your back to sitting on the side of a flat bed without using bedrails?: A Little Help needed moving to and from a bed to a chair (including a wheelchair)?: A Little Help needed standing up from a chair using your arms (e.g., wheelchair or bedside chair)?: A Little Help needed to walk in hospital room?: A Little Help needed climbing 3-5 steps with a railing? : A Lot 6 Click Score: 17    End of Session Equipment Utilized During Treatment: Gait belt Activity Tolerance: Patient tolerated treatment well Patient left: in chair;with call  bell/phone within reach;with chair alarm set Nurse Communication: Mobility status PT Visit Diagnosis: Unsteadiness on feet (R26.81);History of falling (Z91.81);Other abnormalities of gait and mobility (R26.89)     Time: 2876-8115 PT Time Calculation (min) (ACUTE ONLY): 19 min  Charges:  $Gait Training: 8-22 mins                     [place] Pager [phone] [employer] 11/01/2018, 11:49 AM

## 2018-11-02 DIAGNOSIS — I350 Nonrheumatic aortic (valve) stenosis: Secondary | ICD-10-CM | POA: Diagnosis not present

## 2018-11-02 DIAGNOSIS — S72101D Unspecified trochanteric fracture of right femur, subsequent encounter for closed fracture with routine healing: Secondary | ICD-10-CM | POA: Diagnosis not present

## 2018-11-02 DIAGNOSIS — R69 Illness, unspecified: Secondary | ICD-10-CM | POA: Diagnosis not present

## 2018-11-02 DIAGNOSIS — Z853 Personal history of malignant neoplasm of breast: Secondary | ICD-10-CM | POA: Diagnosis not present

## 2018-11-02 DIAGNOSIS — J45909 Unspecified asthma, uncomplicated: Secondary | ICD-10-CM | POA: Diagnosis not present

## 2018-11-02 DIAGNOSIS — I10 Essential (primary) hypertension: Secondary | ICD-10-CM | POA: Diagnosis not present

## 2018-11-02 DIAGNOSIS — K219 Gastro-esophageal reflux disease without esophagitis: Secondary | ICD-10-CM | POA: Diagnosis not present

## 2018-11-02 DIAGNOSIS — M1990 Unspecified osteoarthritis, unspecified site: Secondary | ICD-10-CM | POA: Diagnosis not present

## 2018-11-02 DIAGNOSIS — E785 Hyperlipidemia, unspecified: Secondary | ICD-10-CM | POA: Diagnosis not present

## 2018-11-02 DIAGNOSIS — R296 Repeated falls: Secondary | ICD-10-CM | POA: Diagnosis not present

## 2018-11-03 LAB — CULTURE, BLOOD (ROUTINE X 2)
Culture: NO GROWTH
Culture: NO GROWTH
Special Requests: ADEQUATE

## 2018-11-04 DIAGNOSIS — J45909 Unspecified asthma, uncomplicated: Secondary | ICD-10-CM | POA: Diagnosis not present

## 2018-11-04 DIAGNOSIS — E785 Hyperlipidemia, unspecified: Secondary | ICD-10-CM | POA: Diagnosis not present

## 2018-11-04 DIAGNOSIS — R296 Repeated falls: Secondary | ICD-10-CM | POA: Diagnosis not present

## 2018-11-04 DIAGNOSIS — R69 Illness, unspecified: Secondary | ICD-10-CM | POA: Diagnosis not present

## 2018-11-04 DIAGNOSIS — Z853 Personal history of malignant neoplasm of breast: Secondary | ICD-10-CM | POA: Diagnosis not present

## 2018-11-04 DIAGNOSIS — S72101D Unspecified trochanteric fracture of right femur, subsequent encounter for closed fracture with routine healing: Secondary | ICD-10-CM | POA: Diagnosis not present

## 2018-11-04 DIAGNOSIS — K219 Gastro-esophageal reflux disease without esophagitis: Secondary | ICD-10-CM | POA: Diagnosis not present

## 2018-11-04 DIAGNOSIS — M1990 Unspecified osteoarthritis, unspecified site: Secondary | ICD-10-CM | POA: Diagnosis not present

## 2018-11-04 DIAGNOSIS — I10 Essential (primary) hypertension: Secondary | ICD-10-CM | POA: Diagnosis not present

## 2018-11-04 DIAGNOSIS — I350 Nonrheumatic aortic (valve) stenosis: Secondary | ICD-10-CM | POA: Diagnosis not present

## 2018-11-07 DIAGNOSIS — Z853 Personal history of malignant neoplasm of breast: Secondary | ICD-10-CM | POA: Diagnosis not present

## 2018-11-07 DIAGNOSIS — D649 Anemia, unspecified: Secondary | ICD-10-CM | POA: Diagnosis not present

## 2018-11-07 DIAGNOSIS — I10 Essential (primary) hypertension: Secondary | ICD-10-CM | POA: Diagnosis not present

## 2018-11-07 DIAGNOSIS — R69 Illness, unspecified: Secondary | ICD-10-CM | POA: Diagnosis not present

## 2018-11-07 DIAGNOSIS — M1990 Unspecified osteoarthritis, unspecified site: Secondary | ICD-10-CM | POA: Diagnosis not present

## 2018-11-07 DIAGNOSIS — S72101D Unspecified trochanteric fracture of right femur, subsequent encounter for closed fracture with routine healing: Secondary | ICD-10-CM | POA: Diagnosis not present

## 2018-11-07 DIAGNOSIS — E785 Hyperlipidemia, unspecified: Secondary | ICD-10-CM | POA: Diagnosis not present

## 2018-11-07 DIAGNOSIS — Z8781 Personal history of (healed) traumatic fracture: Secondary | ICD-10-CM | POA: Diagnosis not present

## 2018-11-07 DIAGNOSIS — J45909 Unspecified asthma, uncomplicated: Secondary | ICD-10-CM | POA: Diagnosis not present

## 2018-11-07 DIAGNOSIS — R9389 Abnormal findings on diagnostic imaging of other specified body structures: Secondary | ICD-10-CM | POA: Diagnosis not present

## 2018-11-07 DIAGNOSIS — R296 Repeated falls: Secondary | ICD-10-CM | POA: Diagnosis not present

## 2018-11-07 DIAGNOSIS — K219 Gastro-esophageal reflux disease without esophagitis: Secondary | ICD-10-CM | POA: Diagnosis not present

## 2018-11-07 DIAGNOSIS — J9601 Acute respiratory failure with hypoxia: Secondary | ICD-10-CM | POA: Diagnosis not present

## 2018-11-07 DIAGNOSIS — I350 Nonrheumatic aortic (valve) stenosis: Secondary | ICD-10-CM | POA: Diagnosis not present

## 2018-11-08 DIAGNOSIS — Z5189 Encounter for other specified aftercare: Secondary | ICD-10-CM | POA: Diagnosis not present

## 2018-11-08 DIAGNOSIS — S72141D Displaced intertrochanteric fracture of right femur, subsequent encounter for closed fracture with routine healing: Secondary | ICD-10-CM | POA: Diagnosis not present

## 2018-11-09 ENCOUNTER — Other Ambulatory Visit: Payer: Self-pay | Admitting: Family Medicine

## 2018-11-09 DIAGNOSIS — I350 Nonrheumatic aortic (valve) stenosis: Secondary | ICD-10-CM | POA: Diagnosis not present

## 2018-11-09 DIAGNOSIS — Z853 Personal history of malignant neoplasm of breast: Secondary | ICD-10-CM | POA: Diagnosis not present

## 2018-11-09 DIAGNOSIS — K219 Gastro-esophageal reflux disease without esophagitis: Secondary | ICD-10-CM | POA: Diagnosis not present

## 2018-11-09 DIAGNOSIS — S72101D Unspecified trochanteric fracture of right femur, subsequent encounter for closed fracture with routine healing: Secondary | ICD-10-CM | POA: Diagnosis not present

## 2018-11-09 DIAGNOSIS — I10 Essential (primary) hypertension: Secondary | ICD-10-CM | POA: Diagnosis not present

## 2018-11-09 DIAGNOSIS — E785 Hyperlipidemia, unspecified: Secondary | ICD-10-CM | POA: Diagnosis not present

## 2018-11-09 DIAGNOSIS — R296 Repeated falls: Secondary | ICD-10-CM | POA: Diagnosis not present

## 2018-11-09 DIAGNOSIS — R9389 Abnormal findings on diagnostic imaging of other specified body structures: Secondary | ICD-10-CM

## 2018-11-09 DIAGNOSIS — M1990 Unspecified osteoarthritis, unspecified site: Secondary | ICD-10-CM | POA: Diagnosis not present

## 2018-11-09 DIAGNOSIS — J45909 Unspecified asthma, uncomplicated: Secondary | ICD-10-CM | POA: Diagnosis not present

## 2018-11-09 DIAGNOSIS — R69 Illness, unspecified: Secondary | ICD-10-CM | POA: Diagnosis not present

## 2018-11-10 ENCOUNTER — Emergency Department (HOSPITAL_COMMUNITY): Payer: Medicare HMO

## 2018-11-10 ENCOUNTER — Inpatient Hospital Stay (HOSPITAL_COMMUNITY)
Admission: EM | Admit: 2018-11-10 | Discharge: 2018-11-17 | DRG: 470 | Disposition: A | Discharge status: Home Health | Payer: Medicare HMO | Attending: Internal Medicine | Admitting: Internal Medicine

## 2018-11-10 ENCOUNTER — Encounter (HOSPITAL_COMMUNITY): Payer: Self-pay | Admitting: Emergency Medicine

## 2018-11-10 ENCOUNTER — Other Ambulatory Visit: Payer: Self-pay

## 2018-11-10 DIAGNOSIS — I509 Heart failure, unspecified: Secondary | ICD-10-CM | POA: Diagnosis not present

## 2018-11-10 DIAGNOSIS — M25551 Pain in right hip: Secondary | ICD-10-CM | POA: Diagnosis not present

## 2018-11-10 DIAGNOSIS — Z66 Do not resuscitate: Secondary | ICD-10-CM | POA: Diagnosis present

## 2018-11-10 DIAGNOSIS — T84090A Other mechanical complication of internal right hip prosthesis, initial encounter: Secondary | ICD-10-CM | POA: Diagnosis not present

## 2018-11-10 DIAGNOSIS — I10 Essential (primary) hypertension: Secondary | ICD-10-CM | POA: Diagnosis not present

## 2018-11-10 DIAGNOSIS — E785 Hyperlipidemia, unspecified: Secondary | ICD-10-CM | POA: Diagnosis not present

## 2018-11-10 DIAGNOSIS — Y92009 Unspecified place in unspecified non-institutional (private) residence as the place of occurrence of the external cause: Secondary | ICD-10-CM

## 2018-11-10 DIAGNOSIS — Z803 Family history of malignant neoplasm of breast: Secondary | ICD-10-CM

## 2018-11-10 DIAGNOSIS — Z8042 Family history of malignant neoplasm of prostate: Secondary | ICD-10-CM

## 2018-11-10 DIAGNOSIS — W010XXA Fall on same level from slipping, tripping and stumbling without subsequent striking against object, initial encounter: Secondary | ICD-10-CM | POA: Diagnosis not present

## 2018-11-10 DIAGNOSIS — Z853 Personal history of malignant neoplasm of breast: Secondary | ICD-10-CM

## 2018-11-10 DIAGNOSIS — D62 Acute posthemorrhagic anemia: Secondary | ICD-10-CM | POA: Diagnosis not present

## 2018-11-10 DIAGNOSIS — Z03818 Encounter for observation for suspected exposure to other biological agents ruled out: Secondary | ICD-10-CM | POA: Diagnosis not present

## 2018-11-10 DIAGNOSIS — Z01811 Encounter for preprocedural respiratory examination: Secondary | ICD-10-CM

## 2018-11-10 DIAGNOSIS — Z1159 Encounter for screening for other viral diseases: Secondary | ICD-10-CM

## 2018-11-10 DIAGNOSIS — G4700 Insomnia, unspecified: Secondary | ICD-10-CM | POA: Diagnosis present

## 2018-11-10 DIAGNOSIS — J45909 Unspecified asthma, uncomplicated: Secondary | ICD-10-CM | POA: Diagnosis present

## 2018-11-10 DIAGNOSIS — S0001XA Abrasion of scalp, initial encounter: Secondary | ICD-10-CM | POA: Diagnosis present

## 2018-11-10 DIAGNOSIS — Z923 Personal history of irradiation: Secondary | ICD-10-CM

## 2018-11-10 DIAGNOSIS — Z87442 Personal history of urinary calculi: Secondary | ICD-10-CM

## 2018-11-10 DIAGNOSIS — Z471 Aftercare following joint replacement surgery: Secondary | ICD-10-CM | POA: Diagnosis not present

## 2018-11-10 DIAGNOSIS — D500 Iron deficiency anemia secondary to blood loss (chronic): Secondary | ICD-10-CM | POA: Diagnosis present

## 2018-11-10 DIAGNOSIS — Z96612 Presence of left artificial shoulder joint: Secondary | ICD-10-CM | POA: Diagnosis present

## 2018-11-10 DIAGNOSIS — Z96649 Presence of unspecified artificial hip joint: Secondary | ICD-10-CM

## 2018-11-10 DIAGNOSIS — W19XXXA Unspecified fall, initial encounter: Secondary | ICD-10-CM

## 2018-11-10 DIAGNOSIS — Z8701 Personal history of pneumonia (recurrent): Secondary | ICD-10-CM

## 2018-11-10 DIAGNOSIS — S2231XA Fracture of one rib, right side, initial encounter for closed fracture: Secondary | ICD-10-CM | POA: Diagnosis present

## 2018-11-10 DIAGNOSIS — K219 Gastro-esophageal reflux disease without esophagitis: Secondary | ICD-10-CM | POA: Diagnosis not present

## 2018-11-10 DIAGNOSIS — E538 Deficiency of other specified B group vitamins: Secondary | ICD-10-CM | POA: Diagnosis present

## 2018-11-10 DIAGNOSIS — I350 Nonrheumatic aortic (valve) stenosis: Secondary | ICD-10-CM | POA: Diagnosis present

## 2018-11-10 DIAGNOSIS — Z96641 Presence of right artificial hip joint: Secondary | ICD-10-CM | POA: Diagnosis not present

## 2018-11-10 DIAGNOSIS — S72141A Displaced intertrochanteric fracture of right femur, initial encounter for closed fracture: Secondary | ICD-10-CM | POA: Diagnosis not present

## 2018-11-10 DIAGNOSIS — S72009A Fracture of unspecified part of neck of unspecified femur, initial encounter for closed fracture: Secondary | ICD-10-CM | POA: Diagnosis present

## 2018-11-10 DIAGNOSIS — Z9012 Acquired absence of left breast and nipple: Secondary | ICD-10-CM | POA: Diagnosis not present

## 2018-11-10 DIAGNOSIS — S8991XA Unspecified injury of right lower leg, initial encounter: Secondary | ICD-10-CM | POA: Diagnosis not present

## 2018-11-10 DIAGNOSIS — N289 Disorder of kidney and ureter, unspecified: Secondary | ICD-10-CM | POA: Diagnosis not present

## 2018-11-10 DIAGNOSIS — D509 Iron deficiency anemia, unspecified: Secondary | ICD-10-CM | POA: Diagnosis present

## 2018-11-10 DIAGNOSIS — S72001A Fracture of unspecified part of neck of right femur, initial encounter for closed fracture: Secondary | ICD-10-CM | POA: Diagnosis not present

## 2018-11-10 MED ORDER — FENTANYL CITRATE (PF) 100 MCG/2ML IJ SOLN
50.0000 ug | Freq: Once | INTRAMUSCULAR | Status: AC
  Administered 2018-11-10: 50 ug via INTRAMUSCULAR
  Filled 2018-11-10: qty 2

## 2018-11-10 NOTE — ED Triage Notes (Signed)
Pt reports fall tonight with right knee pain. Hx hip surgery x 2 weeks ago. Denies hitting her head, no LOC.

## 2018-11-11 ENCOUNTER — Emergency Department (HOSPITAL_COMMUNITY): Payer: Medicare HMO

## 2018-11-11 ENCOUNTER — Other Ambulatory Visit: Payer: Self-pay

## 2018-11-11 DIAGNOSIS — Z1159 Encounter for screening for other viral diseases: Secondary | ICD-10-CM | POA: Diagnosis not present

## 2018-11-11 DIAGNOSIS — S72001A Fracture of unspecified part of neck of right femur, initial encounter for closed fracture: Secondary | ICD-10-CM | POA: Diagnosis not present

## 2018-11-11 DIAGNOSIS — D500 Iron deficiency anemia secondary to blood loss (chronic): Secondary | ICD-10-CM | POA: Diagnosis not present

## 2018-11-11 DIAGNOSIS — I350 Nonrheumatic aortic (valve) stenosis: Secondary | ICD-10-CM | POA: Diagnosis not present

## 2018-11-11 DIAGNOSIS — S0990XA Unspecified injury of head, initial encounter: Secondary | ICD-10-CM | POA: Diagnosis not present

## 2018-11-11 DIAGNOSIS — Z853 Personal history of malignant neoplasm of breast: Secondary | ICD-10-CM | POA: Diagnosis not present

## 2018-11-11 DIAGNOSIS — Z803 Family history of malignant neoplasm of breast: Secondary | ICD-10-CM | POA: Diagnosis not present

## 2018-11-11 DIAGNOSIS — S0001XA Abrasion of scalp, initial encounter: Secondary | ICD-10-CM | POA: Diagnosis present

## 2018-11-11 DIAGNOSIS — I10 Essential (primary) hypertension: Secondary | ICD-10-CM | POA: Diagnosis not present

## 2018-11-11 DIAGNOSIS — G4700 Insomnia, unspecified: Secondary | ICD-10-CM | POA: Diagnosis present

## 2018-11-11 DIAGNOSIS — Z87442 Personal history of urinary calculi: Secondary | ICD-10-CM | POA: Diagnosis not present

## 2018-11-11 DIAGNOSIS — J45909 Unspecified asthma, uncomplicated: Secondary | ICD-10-CM | POA: Diagnosis present

## 2018-11-11 DIAGNOSIS — Z471 Aftercare following joint replacement surgery: Secondary | ICD-10-CM | POA: Diagnosis not present

## 2018-11-11 DIAGNOSIS — Z66 Do not resuscitate: Secondary | ICD-10-CM | POA: Diagnosis present

## 2018-11-11 DIAGNOSIS — D62 Acute posthemorrhagic anemia: Secondary | ICD-10-CM | POA: Diagnosis not present

## 2018-11-11 DIAGNOSIS — W010XXA Fall on same level from slipping, tripping and stumbling without subsequent striking against object, initial encounter: Secondary | ICD-10-CM | POA: Diagnosis not present

## 2018-11-11 DIAGNOSIS — Z8042 Family history of malignant neoplasm of prostate: Secondary | ICD-10-CM | POA: Diagnosis not present

## 2018-11-11 DIAGNOSIS — E538 Deficiency of other specified B group vitamins: Secondary | ICD-10-CM | POA: Diagnosis not present

## 2018-11-11 DIAGNOSIS — M25461 Effusion, right knee: Secondary | ICD-10-CM | POA: Diagnosis not present

## 2018-11-11 DIAGNOSIS — S72009A Fracture of unspecified part of neck of unspecified femur, initial encounter for closed fracture: Secondary | ICD-10-CM | POA: Diagnosis present

## 2018-11-11 DIAGNOSIS — S2231XA Fracture of one rib, right side, initial encounter for closed fracture: Secondary | ICD-10-CM | POA: Diagnosis not present

## 2018-11-11 DIAGNOSIS — Z743 Need for continuous supervision: Secondary | ICD-10-CM | POA: Diagnosis not present

## 2018-11-11 DIAGNOSIS — Y92009 Unspecified place in unspecified non-institutional (private) residence as the place of occurrence of the external cause: Secondary | ICD-10-CM | POA: Diagnosis not present

## 2018-11-11 DIAGNOSIS — N289 Disorder of kidney and ureter, unspecified: Secondary | ICD-10-CM | POA: Diagnosis not present

## 2018-11-11 DIAGNOSIS — S72141A Displaced intertrochanteric fracture of right femur, initial encounter for closed fracture: Secondary | ICD-10-CM | POA: Diagnosis not present

## 2018-11-11 DIAGNOSIS — T84090A Other mechanical complication of internal right hip prosthesis, initial encounter: Secondary | ICD-10-CM | POA: Diagnosis not present

## 2018-11-11 DIAGNOSIS — Z8701 Personal history of pneumonia (recurrent): Secondary | ICD-10-CM | POA: Diagnosis not present

## 2018-11-11 DIAGNOSIS — R279 Unspecified lack of coordination: Secondary | ICD-10-CM | POA: Diagnosis not present

## 2018-11-11 DIAGNOSIS — Z9012 Acquired absence of left breast and nipple: Secondary | ICD-10-CM | POA: Diagnosis not present

## 2018-11-11 DIAGNOSIS — I509 Heart failure, unspecified: Secondary | ICD-10-CM | POA: Diagnosis not present

## 2018-11-11 DIAGNOSIS — Z96612 Presence of left artificial shoulder joint: Secondary | ICD-10-CM | POA: Diagnosis present

## 2018-11-11 DIAGNOSIS — Z923 Personal history of irradiation: Secondary | ICD-10-CM | POA: Diagnosis not present

## 2018-11-11 DIAGNOSIS — E785 Hyperlipidemia, unspecified: Secondary | ICD-10-CM | POA: Diagnosis not present

## 2018-11-11 DIAGNOSIS — Z01811 Encounter for preprocedural respiratory examination: Secondary | ICD-10-CM | POA: Diagnosis not present

## 2018-11-11 DIAGNOSIS — K219 Gastro-esophageal reflux disease without esophagitis: Secondary | ICD-10-CM | POA: Diagnosis not present

## 2018-11-11 DIAGNOSIS — Z96641 Presence of right artificial hip joint: Secondary | ICD-10-CM | POA: Diagnosis not present

## 2018-11-11 LAB — URINALYSIS, ROUTINE W REFLEX MICROSCOPIC
Bilirubin Urine: NEGATIVE
Glucose, UA: NEGATIVE mg/dL
Hgb urine dipstick: NEGATIVE
Ketones, ur: NEGATIVE mg/dL
Leukocytes,Ua: NEGATIVE
Nitrite: NEGATIVE
Protein, ur: NEGATIVE mg/dL
Specific Gravity, Urine: 1.02 (ref 1.005–1.030)
pH: 5 (ref 5.0–8.0)

## 2018-11-11 LAB — PROTIME-INR
INR: 1.2 (ref 0.8–1.2)
Prothrombin Time: 15.3 seconds — ABNORMAL HIGH (ref 11.4–15.2)

## 2018-11-11 LAB — BASIC METABOLIC PANEL
Anion gap: 11 (ref 5–15)
BUN: 20 mg/dL (ref 8–23)
CO2: 22 mmol/L (ref 22–32)
Calcium: 9.2 mg/dL (ref 8.9–10.3)
Chloride: 108 mmol/L (ref 98–111)
Creatinine, Ser: 0.86 mg/dL (ref 0.44–1.00)
GFR calc Af Amer: >60 mL/min (ref >60)
GFR calc non Af Amer: >60 mL/min (ref >60)
Glucose, Bld: 121 mg/dL — ABNORMAL HIGH (ref 70–99)
Potassium: 4 mmol/L (ref 3.5–5.1)
Sodium: 141 mmol/L (ref 135–145)

## 2018-11-11 LAB — TYPE AND SCREEN
ABO/RH(D): O POS
Antibody Screen: NEGATIVE

## 2018-11-11 LAB — CBC WITH DIFFERENTIAL/PLATELET
Abs Immature Granulocytes: 0.06 10*3/uL (ref 0.00–0.07)
Basophils Absolute: 0 10*3/uL (ref 0.0–0.1)
Basophils Relative: 0 %
Eosinophils Absolute: 0.1 10*3/uL (ref 0.0–0.5)
Eosinophils Relative: 1 %
HCT: 28.5 % — ABNORMAL LOW (ref 36.0–46.0)
Hemoglobin: 8.6 g/dL — ABNORMAL LOW (ref 12.0–15.0)
Immature Granulocytes: 1 %
Lymphocytes Relative: 15 %
Lymphs Abs: 1.5 10*3/uL (ref 0.7–4.0)
MCH: 29.7 pg (ref 26.0–34.0)
MCHC: 30.2 g/dL (ref 30.0–36.0)
MCV: 98.3 fL (ref 80.0–100.0)
Monocytes Absolute: 0.8 10*3/uL (ref 0.1–1.0)
Monocytes Relative: 8 %
Neutro Abs: 7.6 10*3/uL (ref 1.7–7.7)
Neutrophils Relative %: 75 %
Platelets: 330 10*3/uL (ref 150–400)
RBC: 2.9 MIL/uL — ABNORMAL LOW (ref 3.87–5.11)
RDW: 14.4 % (ref 11.5–15.5)
WBC: 9.9 10*3/uL (ref 4.0–10.5)
nRBC: 0 % (ref 0.0–0.2)

## 2018-11-11 LAB — SARS CORONAVIRUS 2 BY RT PCR (HOSPITAL ORDER, PERFORMED IN ~~LOC~~ HOSPITAL LAB): SARS Coronavirus 2: NEGATIVE

## 2018-11-11 MED ORDER — ADULT MULTIVITAMIN W/MINERALS CH
1.0000 | ORAL_TABLET | Freq: Every day | ORAL | Status: DC
  Administered 2018-11-12 – 2018-11-17 (×6): 1 via ORAL
  Filled 2018-11-11 (×6): qty 1

## 2018-11-11 MED ORDER — ENOXAPARIN SODIUM 40 MG/0.4ML ~~LOC~~ SOLN
40.0000 mg | SUBCUTANEOUS | Status: AC
  Administered 2018-11-11 – 2018-11-13 (×3): 40 mg via SUBCUTANEOUS
  Filled 2018-11-11 (×3): qty 0.4

## 2018-11-11 MED ORDER — METHOCARBAMOL 500 MG PO TABS
500.0000 mg | ORAL_TABLET | Freq: Four times a day (QID) | ORAL | Status: DC | PRN
  Administered 2018-11-11 – 2018-11-14 (×6): 500 mg via ORAL
  Filled 2018-11-11 (×6): qty 1

## 2018-11-11 MED ORDER — FERROUS SULFATE 325 (65 FE) MG PO TABS
325.0000 mg | ORAL_TABLET | Freq: Every day | ORAL | Status: DC
  Administered 2018-11-12 – 2018-11-17 (×6): 325 mg via ORAL
  Filled 2018-11-11 (×6): qty 1

## 2018-11-11 MED ORDER — PANTOPRAZOLE SODIUM 40 MG PO TBEC
40.0000 mg | DELAYED_RELEASE_TABLET | Freq: Every day | ORAL | Status: DC
  Administered 2018-11-12 – 2018-11-17 (×6): 40 mg via ORAL
  Filled 2018-11-11 (×6): qty 1

## 2018-11-11 MED ORDER — FENTANYL CITRATE (PF) 100 MCG/2ML IJ SOLN
50.0000 ug | Freq: Once | INTRAMUSCULAR | Status: AC
  Administered 2018-11-11: 50 ug via INTRAVENOUS
  Filled 2018-11-11: qty 2

## 2018-11-11 MED ORDER — CITALOPRAM HYDROBROMIDE 20 MG PO TABS
20.0000 mg | ORAL_TABLET | Freq: Every day | ORAL | Status: DC
  Administered 2018-11-11 – 2018-11-17 (×7): 20 mg via ORAL
  Filled 2018-11-11 (×7): qty 1

## 2018-11-11 MED ORDER — METHOCARBAMOL 1000 MG/10ML IJ SOLN
500.0000 mg | Freq: Four times a day (QID) | INTRAVENOUS | Status: DC | PRN
  Filled 2018-11-11: qty 5

## 2018-11-11 MED ORDER — ACETAMINOPHEN 325 MG PO TABS
650.0000 mg | ORAL_TABLET | Freq: Four times a day (QID) | ORAL | Status: DC
  Administered 2018-11-11 – 2018-11-17 (×22): 650 mg via ORAL
  Filled 2018-11-11 (×23): qty 2

## 2018-11-11 MED ORDER — GABAPENTIN 300 MG PO CAPS
300.0000 mg | ORAL_CAPSULE | Freq: Every day | ORAL | Status: DC
  Administered 2018-11-11 – 2018-11-16 (×6): 300 mg via ORAL
  Filled 2018-11-11 (×6): qty 1

## 2018-11-11 MED ORDER — TRAZODONE HCL 50 MG PO TABS
25.0000 mg | ORAL_TABLET | Freq: Every evening | ORAL | Status: AC | PRN
  Administered 2018-11-12: 25 mg via ORAL
  Filled 2018-11-11 (×2): qty 1

## 2018-11-11 MED ORDER — ALBUTEROL SULFATE (2.5 MG/3ML) 0.083% IN NEBU
2.5000 mg | INHALATION_SOLUTION | Freq: Four times a day (QID) | RESPIRATORY_TRACT | Status: DC
  Administered 2018-11-11 – 2018-11-13 (×7): 2.5 mg via RESPIRATORY_TRACT
  Filled 2018-11-11 (×10): qty 3

## 2018-11-11 MED ORDER — MORPHINE SULFATE (PF) 2 MG/ML IV SOLN
0.5000 mg | INTRAVENOUS | Status: DC | PRN
  Administered 2018-11-11 – 2018-11-15 (×13): 0.5 mg via INTRAVENOUS
  Filled 2018-11-11 (×14): qty 1

## 2018-11-11 MED ORDER — POLYETHYLENE GLYCOL 3350 17 G PO PACK
17.0000 g | PACK | Freq: Every day | ORAL | Status: DC
  Administered 2018-11-11 – 2018-11-15 (×4): 17 g via ORAL
  Filled 2018-11-11 (×6): qty 1

## 2018-11-11 MED ORDER — ATORVASTATIN CALCIUM 40 MG PO TABS
40.0000 mg | ORAL_TABLET | Freq: Every day | ORAL | Status: DC
  Administered 2018-11-12 – 2018-11-17 (×6): 40 mg via ORAL
  Filled 2018-11-11 (×6): qty 1

## 2018-11-11 MED ORDER — OXYCODONE HCL 5 MG PO TABS
5.0000 mg | ORAL_TABLET | ORAL | Status: DC | PRN
  Administered 2018-11-11 – 2018-11-13 (×9): 5 mg via ORAL
  Filled 2018-11-11 (×10): qty 1

## 2018-11-11 MED ORDER — OXYCODONE HCL 5 MG PO TABS
2.5000 mg | ORAL_TABLET | ORAL | Status: DC | PRN
  Administered 2018-11-14 – 2018-11-15 (×3): 2.5 mg via ORAL
  Filled 2018-11-11 (×3): qty 1

## 2018-11-11 MED ORDER — BOOST PLUS PO LIQD
237.0000 mL | Freq: Two times a day (BID) | ORAL | Status: DC
  Administered 2018-11-12 – 2018-11-17 (×9): 237 mL via ORAL
  Filled 2018-11-11 (×12): qty 237

## 2018-11-11 NOTE — Progress Notes (Signed)
85 yo with hx of AS, HTN, anemia, breast cancer who presented after mechanical fall with reinjury of right hip.     Per ortho, failed R hip fracture nail, planning for converstion to right total hip arthroplasty on Monday.  Will transfer to [facility] per orthopedic request.  Today's Vitals   11/11/18 0929 11/11/18 1059 11/11/18 1439 11/11/18 1445  BP:   (!) 147/60   Pulse:   98   Resp:   20   Temp:   98.5 F (36.9 C)   TempSrc:   Oral   SpO2:   96%   Weight:      Height:      PainSc: Asleep 10-Worst pain ever  10-Worst pain ever   Body mass index is 28.79 kg/m.  NAD Lungs CTAB RRR, no mgr Abd s/nt/nd RLE shortened  No lee  Please see H&P by Dr. Hal Hope for additional details. Will plan for transfer to [facility] today per orthopedic request. RCRI is 0.  Surgery is planned for Monday for right total hip arthroplasty Ortho requests transfusion of 2 units pRBC prior to surgery, will defer until pt transferred to [facility] (pt currently stable with stable Hb) Lovenox ordered for last dose to be given on Sunday Needs f/u CT scan as outpatient for CXR findings

## 2018-11-11 NOTE — H&P (Signed)
History and Physical    Brandy [name] FTD:[id number] DOB: 07/10/1932 DOA: 11/10/2018  PCP: Kathyrn Lass, MD  Patient coming from: Home.  Chief Complaint: Fall.  Right hip pain.  HPI: Brandy [name] is a 85 y.o. female with history of moderate aortic stenosis, hypertension, chronic anemia, breast cancer was recently admitted for right hip fracture had undergone surgery following which patient was admitted again for respiratory failure secondary to pneumonia had a fall at home on Nov 09, 2018 2 days ago.  Patient states she tripped and fell and also at the same time hit her head.  Did not lose consciousness.  Did not have any chest pain shortness of breath nausea vomiting or diarrhea.  Since the fall patient has been having persistent pain in the right hip and decided to come to the ER last evening.  ED Course: In the ER work-up shows right hip fracture again at the site of recent surgery and on-call orthopedic surgeon was consulted.  Lab work show creatinine of 0.8 sodium 141 potassium 4 hemoglobin 8.6 platelets 330 EKG was normal sinus rhythm a chest x-ray shows persistent opacity which will need further work-up as outpatient with CT scan.  CT head was unremarkable CT of the right hip and pelvis was showing fracture of the right hip around the femoral neck.  On my exam patient is not in distress has mild bruising of the left temple area.  Review of Systems: As per HPI, rest all negative.   Past Medical History:  Diagnosis Date   Anemia    Arthritis    shoulders   Asthma    mild, no inhalers used   Breast cancer ([place]) 12/18/11   left breast masectomy=metastatic ca in (1/1) lymph node ,invasive ductal ca,2 foci,,dcis,lymph ovascular invasion identified,surgical resection margins neg for ca,additional tissue=benign skin and subcutaneous tissue   Bronchitis    hx of;last time >34yr ago   Depression    takes Celexa daily   Difficult intubation    Dysphagia    Full dentures     Gall stones 2015   GERD (gastroesophageal reflux disease)    takes Prilosec prn   H/O hiatal hernia    History of kidney stones    many yrs ago   Hx of radiation therapy 04/26/12 -06/10/12   left breast   Hyperlipidemia    takes Simvastatin daily   Hypertension    takes Amlodipine and Diovan daily   Insomnia    takes Ambien nightly   Urinary urgency     Past Surgical History:  Procedure Laterality Date   APPENDECTOMY     bladder tack     BREAST BIOPSY  1998   left    DILATION AND CURETTAGE OF UTERUS     ENDOSCOPIC RETROGRADE CHOLANGIOPANCREATOGRAPHY (ERCP) WITH PROPOFOL N/A 02/01/2014   Procedure: ENDOSCOPIC RETROGRADE CHOLANGIOPANCREATOGRAPHY (ERCP) WITH PROPOFOL;  Surgeon: [name], MD;  Location: [facility];  Service: Endoscopy;  Laterality: N/A;   ESOPHAGOGASTRODUODENOSCOPY     ESOPHAGOGASTRODUODENOSCOPY N/A 06/26/2016   Procedure: ESOPHAGOGASTRODUODENOSCOPY (EGD);  Surgeon: [name], MD;  Location: [place];  Service: Endoscopy;  Laterality: N/A;   ESOPHAGOGASTRODUODENOSCOPY (EGD) WITH PROPOFOL  12/19/2013   Procedure: ESOPHAGOGASTRODUODENOSCOPY (EGD) WITH PROPOFOL;  Surgeon: Beryle Beams, MD;  Location: [place];  Service: Endoscopy;;   ESOPHAGOGASTRODUODENOSCOPY (EGD) WITH PROPOFOL N/A 09/10/2016   Procedure: ESOPHAGOGASTRODUODENOSCOPY (EGD) WITH PROPOFOL;  Surgeon: [name], MD;  [facility]: [facility];  Service: Endoscopy;  Laterality: N/A;  EUS  06/04/2011   Procedure: UPPER ENDOSCOPIC ULTRASOUND (EUS) LINEAR;  Surgeon: Owens Loffler, MD;  Location: [facility];  Service: Endoscopy;  Laterality: N/A;   EUS N/A 02/01/2014   Procedure: UPPER ENDOSCOPIC ULTRASOUND (EUS) LINEAR;  Surgeon: [name], MD;  Location: [facility];  Service: Endoscopy;  Laterality: N/A;   EXPLORATORY LAPAROTOMY      biopsy of intra-abdominal mass   INTRAMEDULLARY (IM) NAIL INTERTROCHANTERIC Right 10/24/2018   Procedure: INTRAMEDULLARY  (IM) NAIL INTERTROCHANTRIC;  Surgeon: Gaynelle Arabian, MD;  Location: [facility];  Service: Orthopedics;  Laterality: Right;   LAPAROTOMY N/A 11/17/2016   Procedure: EXPLORATORY LAPAROTOMY WITH LYSIS OF ADHESIONS, SMALL BOWEL RESECTION, REPAIR OF INCARCERATED VENTRAL INCISIONAL HERNIA, INSERTION OF BIOLOGIC MESH PATCH,APPLICATION OF WOUND VAC DRESSING;  Surgeon: Armandina Gemma, MD;  Location: [facility];  Service: General;  Laterality: N/A;   MASTECTOMY W/ SENTINEL NODE BIOPSY  12/18/2011   Procedure: MASTECTOMY WITH SENTINEL LYMPH NODE BIOPSY;  Surgeon: Rolm Bookbinder, MD;  Location: [place];  Service: General;  Laterality: Left;   PORT-A-CATH REMOVAL Right 06/15/2013   Procedure: REMOVAL PORT-A-CATH;  Surgeon: Rolm Bookbinder, MD;  Location: [place];  Service: General;  Laterality: Right;   PORTACATH PLACEMENT  01/27/2012   Procedure: INSERTION PORT-A-CATH;  Surgeon: Rolm Bookbinder, MD;  Location: [place];  Service: General;  Laterality: Right;  PORT PLACEMENT   TOTAL SHOULDER REPLACEMENT  2011   left   TUBAL LIGATION       reports that she has never smoked. She has never used smokeless tobacco. She reports that she does not drink alcohol or use drugs.  No Known Allergies  Family History  Problem Relation Age of Onset   Prostate cancer Father    Breast cancer Other     Prior to Admission medications   Medication Sig Start Date End Date Taking? Authorizing Provider  acetaminophen (TYLENOL) 500 MG tablet Take 1,000 mg by mouth every 6 (six) hours as needed (pain).    [provider]  albuterol (VENTOLIN HFA) 108 (90 Base) MCG/ACT inhaler Inhale 2 puffs into the lungs every 6 (six) hours. 11/01/18   Regalado, Jerald Kief A, MD  aspirin EC 325 MG tablet Take 1 tablet (325 mg total) by mouth 2 (two) times daily for 21 days. Then take one 81 mg aspirin once a day for three weeks. Then discontinue aspirin. Patient taking differently: Take 325 mg by mouth  See admin instructions. Start 10/26/18: Take one tablet (325 mg) by mouth twice daily for 21 days, then take one 81 mg aspirin once a day for three weeks, then discontinue aspirin. 10/25/18 11/15/18  Edmisten, Ok Anis, PA  atorvastatin (LIPITOR) 40 MG tablet Take 40 mg by mouth daily.  04/27/16   [provider]  citalopram (CELEXA) 20 MG tablet Take 1 tablet (20 mg total) by mouth daily. 11/01/18   Regalado, Belkys A, MD  Cyanocobalamin (VITAMIN B-12 PO) Take 1 tablet by mouth daily.    [provider]  docusate sodium (COLACE) 100 MG capsule Take 1 capsule (100 mg total) by mouth daily. Patient taking differently: Take 100 mg by mouth daily as needed (constipation).  10/26/18   Rai, Vernelle Emerald, MD  ferrous sulfate 325 (65 FE) MG tablet Take 1 tablet (325 mg total) by mouth 2 (two) times daily with a meal. 11/01/18   Regalado, Belkys A, MD  gabapentin (NEURONTIN) 300 MG capsule Take 1 capsule (300 mg total) by mouth at bedtime. 10/26/18  Rai, Vernelle Emerald, MD  HYDROcodone-acetaminophen (NORCO/VICODIN) 5-325 MG tablet Take 1 tablet by mouth every 6 (six) hours as needed for severe pain. 10/25/18   Edmisten, Ok Anis, PA  methocarbamol (ROBAXIN) 500 MG tablet Take 1 tablet (500 mg total) by mouth every 6 (six) hours as needed for muscle spasms. 10/25/18   Edmisten, Ok Anis, PA  Multiple Vitamin (MULTIVITAMIN WITH MINERALS) TABS tablet Take 1 tablet by mouth daily.    [provider]  omeprazole (PRILOSEC) 40 MG capsule TAKE 1 CAPSULE (40 MG TOTAL) BY MOUTH 2 (TWO) TIMES DAILY BEFORE A MEAL. 05/20/18   [name], MD  phenol (CHLORASEPTIC) 1.4 % LIQD Use as directed 1 spray in the mouth or throat as needed for throat irritation / pain. 11/01/18   Regalado, Belkys A, MD  traMADol (ULTRAM) 50 MG tablet Take 100 mg by mouth 4 (four) times daily.  10/08/18   [provider]  Vitamin D, Ergocalciferol, (DRISDOL) 50000 units CAPS capsule Take 50,000 Units by mouth every  Thursday.     [provider]    Physical Exam: Vitals:   11/10/18 2315 11/11/18 0040 11/11/18 0135 11/11/18 0215  BP: (!) 172/73  (!) 160/67 (!) 144/89  Pulse: 94 90 91 90  Resp:  13 20 (!) 23  Temp:      TempSrc:      SpO2: 96% 95% 94% 96%      Constitutional: Moderately built and nourished. Vitals:   11/10/18 2315 11/11/18 0040 11/11/18 0135 11/11/18 0215  BP: (!) 172/73  (!) 160/67 (!) 144/89  Pulse: 94 90 91 90  Resp:  13 20 (!) 23  Temp:      TempSrc:      SpO2: 96% 95% 94% 96%   Eyes: Anicteric no pallor. ENMT: No discharge from the ears eyes nose and mouth. Neck: No mass felt.  No neck rigidity. Respiratory: No rhonchi or crepitations. Cardiovascular: S1-S2 heard. Abdomen: Soft nontender bowel sounds present. Musculoskeletal: Pain on moving right hip. Skin: Bruise on the left scalp area. Neurologic: Alert awake oriented to time place and person.  Moves all extremities. Psychiatric: Appears normal per normal affect.   Labs on Admission: I have personally reviewed following labs and imaging studies  CBC: Recent Labs  Lab 11/11/18 0150  WBC 9.9  NEUTROABS 7.6  HGB 8.6*  HCT 28.5*  MCV 98.3  PLT 643   Basic Metabolic Panel: Recent Labs  Lab 11/11/18 0150  NA 141  K 4.0  CL 108  CO2 22  GLUCOSE 121*  BUN 20  CREATININE 0.86  CALCIUM 9.2   GFR: Estimated Creatinine Clearance: 40.8 mL/min (by C-G formula based on SCr of 0.86 mg/dL). Liver Function Tests: No results for input(s): AST, ALT, ALKPHOS, BILITOT, PROT, ALBUMIN in the last 168 hours. No results for input(s): LIPASE, AMYLASE in the last 168 hours. No results for input(s): AMMONIA in the last 168 hours. Coagulation Profile: No results for input(s): INR, PROTIME in the last 168 hours. Cardiac Enzymes: No results for input(s): CKTOTAL, CKMB, CKMBINDEX, TROPONINI in the last 168 hours. BNP (last 3 results) No results for input(s): PROBNP in the last 8760 hours. HbA1C: No  results for input(s): HGBA1C in the last 72 hours. CBG: No results for input(s): GLUCAP in the last 168 hours. Lipid Profile: No results for input(s): CHOL, HDL, LDLCALC, TRIG, CHOLHDL, LDLDIRECT in the last 72 hours. Thyroid Function Tests: No results for input(s): TSH, T4TOTAL, FREET4, T3FREE, THYROIDAB in the last  72 hours. Anemia Panel: No results for input(s): VITAMINB12, FOLATE, FERRITIN, TIBC, IRON, RETICCTPCT in the last 72 hours. Urine analysis:    Component Value Date/Time   COLORURINE YELLOW 11/11/2018 0218   APPEARANCEUR CLEAR 11/11/2018 0218   LABSPEC 1.020 11/11/2018 0218   PHURINE 5.0 11/11/2018 0218   GLUCOSEU NEGATIVE 11/11/2018 0218   HGBUR NEGATIVE 11/11/2018 0218   BILIRUBINUR NEGATIVE 11/11/2018 0218   KETONESUR NEGATIVE 11/11/2018 0218   PROTEINUR NEGATIVE 11/11/2018 0218   UROBILINOGEN 1.0 10/10/2009 1100   NITRITE NEGATIVE 11/11/2018 0218   LEUKOCYTESUR NEGATIVE 11/11/2018 0218   Sepsis Labs: @LABRCNTIP (procalcitonin:4,lacticidven:4) ) Recent Results (from the past 240 hour(s))  SARS Coronavirus 2 (CEPHEID - Performed in [place] hospital lab), Hosp Order     Status: None   Collection Time: 11/11/18  1:10 AM  Result Value Ref Range Status   SARS Coronavirus 2 NEGATIVE NEGATIVE Final    Comment: (NOTE) If result is NEGATIVE SARS-CoV-2 target nucleic acids are NOT DETECTED. The SARS-CoV-2 RNA is generally detectable in upper and lower  respiratory specimens during the acute phase of infection. The lowest  concentration of SARS-CoV-2 viral copies this assay can detect is 250  copies / mL. A negative result does not preclude SARS-CoV-2 infection  and should not be used as the sole basis for treatment or other  patient management decisions.  A negative result may occur with  improper specimen collection / handling, submission of specimen other  than nasopharyngeal swab, presence of viral mutation(s) within the  areas targeted by this assay, and  inadequate number of viral copies  (<250 copies / mL). A negative result must be combined with clinical  observations, patient history, and epidemiological information. If result is POSITIVE SARS-CoV-2 target nucleic acids are DETECTED. The SARS-CoV-2 RNA is generally detectable in upper and lower  respiratory specimens dur ing the acute phase of infection.  Positive  results are indicative of active infection with SARS-CoV-2.  Clinical  correlation with patient history and other diagnostic information is  necessary to determine patient infection status.  Positive results do  not rule out bacterial infection or co-infection with other viruses. If result is PRESUMPTIVE POSTIVE SARS-CoV-2 nucleic acids MAY BE PRESENT.   A presumptive positive result was obtained on the submitted specimen  and confirmed on repeat testing.  While 2019 novel coronavirus  (SARS-CoV-2) nucleic acids may be present in the submitted sample  additional confirmatory testing may be necessary for epidemiological  and / or clinical management purposes  to differentiate between  SARS-CoV-2 and other Sarbecovirus currently known to infect humans.  If clinically indicated additional testing with an alternate test  methodology [phone]) is advised. The SARS-CoV-2 RNA is generally  detectable in upper and lower respiratory sp ecimens during the acute  phase of infection. The expected result is Negative. Fact Sheet for Patients:  StrictlyIdeas.no Fact Sheet for Healthcare Providers: BankingDealers.co.za This test is not yet approved or cleared by the [place] FDA and has been authorized for detection and/or diagnosis of SARS-CoV-2 by FDA under an Emergency Use Authorization (EUA).  This EUA will remain in effect (meaning this test can be used) for the duration of the COVID-19 declaration under Section 564(b)(1) of the Act, 21 U.S.C. section 360bbb-3(b)(1), unless the  authorization is terminated or revoked sooner. Performed at [facility], [place] [address] [place], [place] [address]      Radiological Exams on Admission: Dg Pelvis 1-2 Views  Result Date: 11/11/2018 CLINICAL DATA:  Fall  with hip pain EXAM: PELVIS - 1-2 VIEW COMPARISON:  10/31/2018, 10/24/2018, fluoroscopic images 422 1,020 FINDINGS: Residual contrast within the rectosigmoid colon. Interval intramedullary rodding of right femur fracture. Interval change in position of the obliquely oriented hardware with tip projecting beyond the cortical margin of the right femoral head. The base of femoral neck fracture appears more displaced. No femoral head dislocation. Vascular calcifications. IMPRESSION: Status post intramedullary rodding of proximal right femur fracture but with interval change in appearance of the fixating hardware, obliquely oriented hardware projects over the right hip joint, above the cortical margin of the right femoral head. Base of femoral neck fracture appears more displaced compared with intraoperative views. Electronically Signed   By: Donavan Foil M.D.   On: 11/11/2018 00:00   Dg Tibia/fibula Right  Result Date: 11/11/2018 CLINICAL DATA:  Fall EXAM: RIGHT TIBIA AND FIBULA - 2 VIEW COMPARISON:  11/10/2018 knee radiograph FINDINGS: No acute displaced fracture or malalignment. Soft tissues are unremarkable. IMPRESSION: No acute osseous abnormality Electronically Signed   By: Donavan Foil M.D.   On: 11/11/2018 00:01   Ct Head Wo Contrast  Result Date: 11/11/2018 CLINICAL DATA:  Fall EXAM: CT HEAD WITHOUT CONTRAST TECHNIQUE: Contiguous axial images were obtained from the base of the skull through the vertex without intravenous contrast. COMPARISON:  10/24/2018 FINDINGS: Brain: There is atrophy and chronic small vessel disease changes. No acute intracranial abnormality. Specifically, no hemorrhage, hydrocephalus, mass lesion, acute infarction, or significant intracranial injury.  Vascular: No hyperdense vessel or unexpected calcification. Skull: No acute calvarial abnormality. Sinuses/Orbits: No acute findings Other: None IMPRESSION: Atrophy, chronic microvascular disease. No acute intracranial abnormality. Electronically Signed   By: Rolm Baptise M.D.   On: 11/11/2018 00:09   Ct Knee Right Wo Contrast  Result Date: 11/11/2018 CLINICAL DATA:  Fall.  Right knee pain EXAM: CT OF THE RIGHT KNEE WITHOUT CONTRAST TECHNIQUE: Multidetector CT imaging of the right knee was performed according to the standard protocol. Multiplanar CT image reconstructions were also generated. COMPARISON:  Plain films earlier today FINDINGS: Advanced tricompartment degenerative changes within the right knee with joint space loss and extensive spurring. Chondrocalcinosis noted. Small to moderate joint effusion within the right knee. No acute bony abnormality. No fracture, subluxation or dislocation. IMPRESSION: Advanced tricompartment osteoarthritis with small to moderate joint effusion. No acute bony abnormality. Electronically Signed   By: Rolm Baptise M.D.   On: 11/11/2018 03:15   Ct Hip Right Wo Contrast  Result Date: 11/11/2018 CLINICAL DATA:  Fall, hip trauma. Change in hip hardware since prior study. EXAM: CT OF THE RIGHT HIP WITHOUT CONTRAST TECHNIQUE: Multidetector CT imaging of the right hip was performed according to the standard protocol. Multiplanar CT image reconstructions were also generated. COMPARISON:  Plain-film tonight and intraoperative imaging from 10/24/2018 FINDINGS: There is a comminuted right proximal femoral fracture noted involving the femoral neck and lesser trochanter. Since prior prior intraoperative imaging, there is increasing displacement and varus angulation. The femoral neck/head screw now passes through the femoral head and into the superior acetabular wall. Contrast within the sigmoid colon. No free fluid or free air. Urinary bladder unremarkable. IMPRESSION: Change in the  appearance of the right femoral neck fracture and hardware. Increasing varus angulation and displacement of fracture fragments, compatible with re-injury. The femoral neck/head screw now passes through the femoral head and into the superior acetabular fall. Electronically Signed   By: Rolm Baptise M.D.   On: 11/11/2018 03:14   Dg Chest Comprehensive Outpatient Surge 1 View  Result  Date: 11/11/2018 CLINICAL DATA:  85 y/o  F; preop chest radiograph. EXAM: PORTABLE CHEST 1 VIEW COMPARISON:  10/29/2018 CT chest and chest radiograph. FINDINGS: Normal cardiac silhouette. Aortic atherosclerosis with calcification. Stable ill-defined left upper lobe opacity better characterized on prior CT chest. No new consolidation. No pleural effusion or pneumothorax. Severe osteoarthrosis of the right shoulder joint with loss of the acromial humeral distance. Left shoulder arthroplasty partially visualized. IMPRESSION: No new acute pulmonary process identified. Stable ill-defined left upper lobe opacity, follow-up as per prior CT chest. Electronically Signed   By: Kristine Garbe M.D.   On: 11/11/2018 01:25   Dg Knee Complete 4 Views Right  Result Date: 11/10/2018 CLINICAL DATA:  Fall EXAM: RIGHT KNEE - COMPLETE 4+ VIEW COMPARISON:  10/24/2018 FINDINGS: No fracture or malalignment. Advanced patellofemoral degenerative changes with small knee effusion. Advanced arthritis involving the medial and lateral joint space compartments. Chondrocalcinosis. IMPRESSION: 1. No acute osseous abnormality. 2. Advanced tricompartment arthritis with chondrocalcinosis and small knee effusion Electronically Signed   By: Donavan Foil M.D.   On: 11/10/2018 19:38   Dg Femur Min 2 Views Right  Result Date: 11/11/2018 CLINICAL DATA:  Femur fracture EXAM: RIGHT FEMUR 2 VIEWS COMPARISON:  10/24/2018 FINDINGS: Intramedullary rod and distal screw fixation of right proximal femur fracture with rig tip of the right femoral neck hardware component now projecting beyond  cortical margin of the right femoral head. Femoral neck fracture appears more displaced. Mid to distal femur appear intact. IMPRESSION: 1. No acute osseous abnormality of the mid to distal femur. 2. Status post internal fixation of right proximal femur fracture, now with cortical breach of the femoral head by fixating hardware Electronically Signed   By: Donavan Foil M.D.   On: 11/11/2018 00:03    EKG: Independently reviewed.  Normal sinus rhythm with PVCs.  Assessment/Plan Principal Problem:   Hip fracture (HCC) Active Problems:   Essential hypertension   Moderate aortic stenosis   Iron deficiency anemia due to chronic blood loss   Vitamin B 12 deficiency    1. Right hip fracture after mechanical fall has had recent hip fracture in the same place -orthopedic surgeon has been consulted and patient is kept n.p.o. in anticipation of surgery.  Patient will be at moderate to high risk for intermediate risk procedure.  On pain medications. 2. Moderate aortic stenosis presently asymptomatic denies any shortness of breath or chest pain. 3. Chronic anemia second iron deficiency and B12 on supplements.  Closely follow CBC. 4. Hypertension recently antihypertensives were withheld closely follow blood pressure trends. 5. Hyperlipidemia on statins.  Presently n.p.o. 6. Recently admitted for pneumonia.  Chest x-ray shows persistent opacity which will need CAT scan as outpatient.  Patient's COVID-19 test was negative.   DVT prophylaxis: SCDs. Code Status: DNR. Family Communication: Discussed with patient. Disposition Plan: Probably will need rehab. Consults called: Orthopedics. Admission status: Inpatient.   Rise Patience MD [facility] Pager [phone].  If 7PM-7AM, please contact night-coverage [email or web address] Password [facility]  11/11/2018, 3:46 AM

## 2018-11-11 NOTE — ED Notes (Signed)
ED TO INPATIENT HANDOFF REPORT  ED Nurse Name and Phone #: Gwyndolyn Saxon [phone]  S Name/Age/Gender Brandy [name] 85 y.o. female Room/Bed: 026C/026C  Code Status   Code Status: DNR  Home/SNF/Other Home Patient oriented to: self, place, time and situation Is this baseline? Yes   Triage Complete: Triage complete  Chief Complaint Fall, R leg pain  Triage Note Pt reports fall tonight with right knee pain. Hx hip surgery x 2 weeks ago. Denies hitting her head, no LOC.   Allergies No Known Allergies  Level of Care/Admitting Diagnosis ED Disposition    ED Disposition Condition Comment   Admit  Hospital Area: [facility] [[id number]]  Level of Care: Telemetry Medical [001]  Covid Evaluation: N/A  Diagnosis: Hip fracture [facility]) [[id number]]  Admitting Physician: Rise Patience [phone]  Attending Physician: Rise Patience [phone]  Estimated length of stay: past midnight tomorrow  Certification:: I certify this patient will need inpatient services for at least 2 midnights  PT Class (Do Not Modify): Inpatient [101]  PT Acc Code (Do Not Modify): Private [1]       B Medical/Surgery History Past Medical History:  Diagnosis Date  . Anemia   . Arthritis    shoulders  . Asthma    mild, no inhalers used  . Breast cancer ([place]) 12/18/11   left breast masectomy=metastatic ca in (1/1) lymph node ,invasive ductal ca,2 foci,,dcis,lymph ovascular invasion identified,surgical resection margins neg for ca,additional tissue=benign skin and subcutaneous tissue  . Bronchitis    hx of;last time >27yr ago  . Depression    takes Celexa daily  . Difficult intubation   . Dysphagia   . Full dentures   . Gall stones 2015  . GERD (gastroesophageal reflux disease)    takes Prilosec prn  . H/O hiatal hernia   . History of kidney stones    many yrs ago  . Hx of radiation therapy 04/26/12 -06/10/12   left breast  . Hyperlipidemia    takes Simvastatin daily  .  Hypertension    takes Amlodipine and Diovan daily  . Insomnia    takes Ambien nightly  . Urinary urgency    Past Surgical History:  Procedure Laterality Date  . APPENDECTOMY    . bladder tack    . BREAST BIOPSY  1998   left   . DILATION AND CURETTAGE OF UTERUS    . ENDOSCOPIC RETROGRADE CHOLANGIOPANCREATOGRAPHY (ERCP) WITH PROPOFOL N/A 02/01/2014   Procedure: ENDOSCOPIC RETROGRADE CHOLANGIOPANCREATOGRAPHY (ERCP) WITH PROPOFOL;  Surgeon: [name], MD;  [facility];  Service: Endoscopy;  Laterality: N/A;  . ESOPHAGOGASTRODUODENOSCOPY    . ESOPHAGOGASTRODUODENOSCOPY N/A 06/26/2016   Procedure: ESOPHAGOGASTRODUODENOSCOPY (EGD);  Surgeon: [name], MD;  Location: [place];  Service: Endoscopy;  Laterality: N/A;  . ESOPHAGOGASTRODUODENOSCOPY (EGD) WITH PROPOFOL  12/19/2013   Procedure: ESOPHAGOGASTRODUODENOSCOPY (EGD) WITH PROPOFOL;  Surgeon: Beryle Beams, MD;  Location: [place];  Service: Endoscopy;;  . ESOPHAGOGASTRODUODENOSCOPY (EGD) WITH PROPOFOL N/A 09/10/2016   Procedure: ESOPHAGOGASTRODUODENOSCOPY (EGD) WITH PROPOFOL;  Surgeon: [name], MD;  [facility];  Service: Endoscopy;  Laterality: N/A;  . EUS  06/04/2011   Procedure: UPPER ENDOSCOPIC ULTRASOUND (EUS) LINEAR;  Surgeon: Owens Loffler, MD;  [facility];  Service: Endoscopy;  Laterality: N/A;  . EUS N/A 02/01/2014   Procedure: UPPER ENDOSCOPIC ULTRASOUND (EUS) LINEAR;  Surgeon: [name], MD;  [facility];  Service: Endoscopy;  Laterality: N/A;  . EXPLORATORY LAPAROTOMY  biopsy of intra-abdominal mass  . INTRAMEDULLARY (IM) NAIL INTERTROCHANTERIC Right 10/24/2018   Procedure: INTRAMEDULLARY (IM) NAIL INTERTROCHANTRIC;  Surgeon: Gaynelle Arabian, MD;  [facility];  Service: Orthopedics;  Laterality: Right;  . LAPAROTOMY N/A 11/17/2016   Procedure: EXPLORATORY LAPAROTOMY WITH LYSIS OF ADHESIONS, SMALL BOWEL RESECTION, REPAIR OF INCARCERATED VENTRAL  INCISIONAL HERNIA, INSERTION OF BIOLOGIC MESH PATCH,APPLICATION OF WOUND VAC DRESSING;  Surgeon: Armandina Gemma, MD;  [facility];  Service: General;  Laterality: N/A;  . MASTECTOMY W/ SENTINEL NODE BIOPSY  12/18/2011   Procedure: MASTECTOMY WITH SENTINEL LYMPH NODE BIOPSY;  Surgeon: Rolm Bookbinder, MD;  Location: [place];  Service: General;  Laterality: Left;  . PORT-A-CATH REMOVAL Right 06/15/2013   Procedure: REMOVAL PORT-A-CATH;  Surgeon: Rolm Bookbinder, MD;  Location: [place];  Service: General;  Laterality: Right;  . PORTACATH PLACEMENT  01/27/2012   Procedure: INSERTION PORT-A-CATH;  Surgeon: Rolm Bookbinder, MD;  Location: [place];  Service: General;  Laterality: Right;  PORT PLACEMENT  . TOTAL SHOULDER REPLACEMENT  2011   left  . TUBAL LIGATION       A IV Location/Drains/Wounds Patient Lines/Drains/Airways Status   Active Line/Drains/Airways    Name:   Placement date:   Placement time:   Site:   Days:   Peripheral IV 11/10/18 Right Hand   11/10/18    2335    Hand   1   External Urinary Catheter   11/11/18    0155    -   less than 1   Incision (Closed) 10/24/18 Hip   10/24/18    2145     18          Intake/Output Last 24 hours No intake or output data in the 24 hours ending 11/11/18 0414  Labs/Imaging Results for orders placed or performed during the hospital encounter of 11/10/18 (from the past 48 hour(s))  SARS Coronavirus 2 (CEPHEID - Performed in [facility] hospital lab), Hosp Order     Status: None   Collection Time: 11/11/18  1:10 AM  Result Value Ref Range   SARS Coronavirus 2 NEGATIVE NEGATIVE    Comment: (NOTE) If result is NEGATIVE SARS-CoV-2 target nucleic acids are NOT DETECTED. The SARS-CoV-2 RNA is generally detectable in upper and lower  respiratory specimens during the acute phase of infection. The lowest  concentration of SARS-CoV-2 viral copies this assay can detect is 250  copies / mL. A negative result  does not preclude SARS-CoV-2 infection  and should not be used as the sole basis for treatment or other  patient management decisions.  A negative result may occur with  improper specimen collection / handling, submission of specimen other  than nasopharyngeal swab, presence of viral mutation(s) within the  areas targeted by this assay, and inadequate number of viral copies  (<250 copies / mL). A negative result must be combined with clinical  observations, patient history, and epidemiological information. If result is POSITIVE SARS-CoV-2 target nucleic acids are DETECTED. The SARS-CoV-2 RNA is generally detectable in upper and lower  respiratory specimens dur ing the acute phase of infection.  Positive  results are indicative of active infection with SARS-CoV-2.  Clinical  correlation with patient history and other diagnostic information is  necessary to determine patient infection status.  Positive results do  not rule out bacterial infection or co-infection with other viruses. If result is PRESUMPTIVE POSTIVE SARS-CoV-2 nucleic acids MAY BE PRESENT.   A presumptive positive result was obtained on  the submitted specimen  and confirmed on repeat testing.  While 2019 novel coronavirus  (SARS-CoV-2) nucleic acids may be present in the submitted sample  additional confirmatory testing may be necessary for epidemiological  and / or clinical management purposes  to differentiate between  SARS-CoV-2 and other Sarbecovirus currently known to infect humans.  If clinically indicated additional testing with an alternate test  methodology [phone]) is advised. The SARS-CoV-2 RNA is generally  detectable in upper and lower respiratory sp ecimens during the acute  phase of infection. The expected result is Negative. Fact Sheet for Patients:  StrictlyIdeas.no Fact Sheet for Healthcare Providers: BankingDealers.co.za This test is not yet approved or  cleared by the [place] FDA and has been authorized for detection and/or diagnosis of SARS-CoV-2 by FDA under an Emergency Use Authorization (EUA).  This EUA will remain in effect (meaning this test can be used) for the duration of the COVID-19 declaration under Section 564(b)(1) of the Act, 21 U.S.C. section 360bbb-3(b)(1), unless the authorization is terminated or revoked sooner. Performed at [facility], [place] [address] [place], [place] [address]   Type and screen [place]     Status: None   Collection Time: 11/11/18  1:43 AM  Result Value Ref Range   ABO/RH(D) O POS    Antibody Screen NEG    Sample Expiration      11/14/2018,2359 Performed at [facility], [place] [address], [place], [place] [address]   CBC with Differential     Status: Abnormal   Collection Time: 11/11/18  1:50 AM  Result Value Ref Range   WBC 9.9 4.0 - 10.5 K/uL   RBC 2.90 (L) 3.87 - 5.11 MIL/uL   Hemoglobin 8.6 (L) 12.0 - 15.0 g/dL   HCT 28.5 (L) 36.0 - 46.0 %   MCV 98.3 80.0 - 100.0 fL   MCH 29.7 26.0 - 34.0 pg   MCHC 30.2 30.0 - 36.0 g/dL   RDW 14.4 11.5 - 15.5 %   Platelets 330 150 - 400 K/uL   nRBC 0.0 0.0 - 0.2 %   Neutrophils Relative % 75 %   Neutro Abs 7.6 1.7 - 7.7 K/uL   Lymphocytes Relative 15 %   Lymphs Abs 1.5 0.7 - 4.0 K/uL   Monocytes Relative 8 %   Monocytes Absolute 0.8 0.1 - 1.0 K/uL   Eosinophils Relative 1 %   Eosinophils Absolute 0.1 0.0 - 0.5 K/uL   Basophils Relative 0 %   Basophils Absolute 0.0 0.0 - 0.1 K/uL   Immature Granulocytes 1 %   Abs Immature Granulocytes 0.06 0.00 - 0.07 K/uL    Comment: Performed at [place] [address], [place],  [address]  Basic metabolic panel     Status: Abnormal   Collection Time: 11/11/18  1:50 AM  Result Value Ref Range   Sodium 141 135 - 145 mmol/L   Potassium 4.0 3.5 - 5.1 mmol/L   Chloride 108 98 - 111 mmol/L   CO2 22 22 - 32 mmol/L   Glucose, Bld 121 (H) 70 - 99 mg/dL   BUN  20 8 - 23 mg/dL   Creatinine, Ser 0.86 0.44 - 1.00 mg/dL   Calcium 9.2 8.9 - 10.3 mg/dL   GFR calc non Af Amer >60 >60 mL/min   GFR calc Af Amer >60 >60 mL/min   Anion gap 11 5 - 15    Comment: Performed at [facility]  [address], [place], [place] [address]  Urinalysis, Routine w reflex microscopic     Status: None   Collection Time: 11/11/18  2:18 AM  Result Value Ref Range   Color, Urine YELLOW YELLOW   APPearance CLEAR CLEAR   Specific Gravity, Urine 1.020 1.005 - 1.030   pH 5.0 5.0 - 8.0   Glucose, UA NEGATIVE NEGATIVE mg/dL   Hgb urine dipstick NEGATIVE NEGATIVE   Bilirubin Urine NEGATIVE NEGATIVE   Ketones, ur NEGATIVE NEGATIVE mg/dL   Protein, ur NEGATIVE NEGATIVE mg/dL   Nitrite NEGATIVE NEGATIVE   Leukocytes,Ua NEGATIVE NEGATIVE    Comment: Performed at [facility] [address] [place], [place] [address]  Protime-INR     Status: Abnormal   Collection Time: 11/11/18  3:03 AM  Result Value Ref Range   Prothrombin Time 15.3 (H) 11.4 - 15.2 seconds   INR 1.2 0.8 - 1.2    Comment: (NOTE) INR goal varies based on device and disease states. Performed at [facility], [place] [address], [place], [place] [address]    Dg Pelvis 1-2 Views  Result Date: 11/11/2018 CLINICAL DATA:  Fall with hip pain EXAM: PELVIS - 1-2 VIEW COMPARISON:  10/31/2018, 10/24/2018, fluoroscopic images 751 1,020 FINDINGS: Residual contrast within the rectosigmoid colon. Interval intramedullary rodding of right femur fracture. Interval change in position of the obliquely oriented hardware with tip projecting beyond the cortical margin of the right femoral head. The base of femoral neck fracture appears more displaced. No femoral head dislocation. Vascular calcifications. IMPRESSION: Status post intramedullary rodding of proximal right femur fracture but with interval change in appearance of the fixating hardware, obliquely oriented hardware projects over the right hip joint, above the  cortical margin of the right femoral head. Base of femoral neck fracture appears more displaced compared with intraoperative views. Electronically Signed   By: Donavan Foil M.D.   On: 11/11/2018 00:00   Dg Tibia/fibula Right  Result Date: 11/11/2018 CLINICAL DATA:  Fall EXAM: RIGHT TIBIA AND FIBULA - 2 VIEW COMPARISON:  11/10/2018 knee radiograph FINDINGS: No acute displaced fracture or malalignment. Soft tissues are unremarkable. IMPRESSION: No acute osseous abnormality Electronically Signed   By: Donavan Foil M.D.   On: 11/11/2018 00:01   Ct Head Wo Contrast  Result Date: 11/11/2018 CLINICAL DATA:  Fall EXAM: CT HEAD WITHOUT CONTRAST TECHNIQUE: Contiguous axial images were obtained from the base of the skull through the vertex without intravenous contrast. COMPARISON:  10/24/2018 FINDINGS: Brain: There is atrophy and chronic small vessel disease changes. No acute intracranial abnormality. Specifically, no hemorrhage, hydrocephalus, mass lesion, acute infarction, or significant intracranial injury. Vascular: No hyperdense vessel or unexpected calcification. Skull: No acute calvarial abnormality. Sinuses/Orbits: No acute findings Other: None IMPRESSION: Atrophy, chronic microvascular disease. No acute intracranial abnormality. Electronically Signed   By: Rolm Baptise M.D.   On: 11/11/2018 00:09   Ct Knee Right Wo Contrast  Result Date: 11/11/2018 CLINICAL DATA:  Fall.  Right knee pain EXAM: CT OF THE RIGHT KNEE WITHOUT CONTRAST TECHNIQUE: Multidetector CT imaging of the right knee was performed according to the standard protocol. Multiplanar CT image reconstructions were also generated. COMPARISON:  Plain films earlier today FINDINGS: Advanced tricompartment degenerative changes within the right knee with joint space loss and extensive spurring. Chondrocalcinosis noted. Small to moderate joint effusion within the right knee. No acute bony abnormality. No fracture, subluxation or dislocation. IMPRESSION:  Advanced tricompartment osteoarthritis with small to moderate joint effusion. No acute bony abnormality. Electronically  Signed   By: Rolm Baptise M.D.   On: 11/11/2018 03:15   Ct Hip Right Wo Contrast  Result Date: 11/11/2018 CLINICAL DATA:  Fall, hip trauma. Change in hip hardware since prior study. EXAM: CT OF THE RIGHT HIP WITHOUT CONTRAST TECHNIQUE: Multidetector CT imaging of the right hip was performed according to the standard protocol. Multiplanar CT image reconstructions were also generated. COMPARISON:  Plain-film tonight and intraoperative imaging from 10/24/2018 FINDINGS: There is a comminuted right proximal femoral fracture noted involving the femoral neck and lesser trochanter. Since prior prior intraoperative imaging, there is increasing displacement and varus angulation. The femoral neck/head screw now passes through the femoral head and into the superior acetabular wall. Contrast within the sigmoid colon. No free fluid or free air. Urinary bladder unremarkable. IMPRESSION: Change in the appearance of the right femoral neck fracture and hardware. Increasing varus angulation and displacement of fracture fragments, compatible with re-injury. The femoral neck/head screw now passes through the femoral head and into the superior acetabular fall. Electronically Signed   By: Rolm Baptise M.D.   On: 11/11/2018 03:14   Dg Chest Port 1 View  Result Date: 11/11/2018 CLINICAL DATA:  85 y/o  F; preop chest radiograph. EXAM: PORTABLE CHEST 1 VIEW COMPARISON:  10/29/2018 CT chest and chest radiograph. FINDINGS: Normal cardiac silhouette. Aortic atherosclerosis with calcification. Stable ill-defined left upper lobe opacity better characterized on prior CT chest. No new consolidation. No pleural effusion or pneumothorax. Severe osteoarthrosis of the right shoulder joint with loss of the acromial humeral distance. Left shoulder arthroplasty partially visualized. IMPRESSION: No new acute pulmonary process  identified. Stable ill-defined left upper lobe opacity, follow-up as per prior CT chest. Electronically Signed   By: Kristine Garbe M.D.   On: 11/11/2018 01:25   Dg Knee Complete 4 Views Right  Result Date: 11/10/2018 CLINICAL DATA:  Fall EXAM: RIGHT KNEE - COMPLETE 4+ VIEW COMPARISON:  10/24/2018 FINDINGS: No fracture or malalignment. Advanced patellofemoral degenerative changes with small knee effusion. Advanced arthritis involving the medial and lateral joint space compartments. Chondrocalcinosis. IMPRESSION: 1. No acute osseous abnormality. 2. Advanced tricompartment arthritis with chondrocalcinosis and small knee effusion Electronically Signed   By: Donavan Foil M.D.   On: 11/10/2018 19:38   Dg Femur Min 2 Views Right  Result Date: 11/11/2018 CLINICAL DATA:  Femur fracture EXAM: RIGHT FEMUR 2 VIEWS COMPARISON:  10/24/2018 FINDINGS: Intramedullary rod and distal screw fixation of right proximal femur fracture with rig tip of the right femoral neck hardware component now projecting beyond cortical margin of the right femoral head. Femoral neck fracture appears more displaced. Mid to distal femur appear intact. IMPRESSION: 1. No acute osseous abnormality of the mid to distal femur. 2. Status post internal fixation of right proximal femur fracture, now with cortical breach of the femoral head by fixating hardware Electronically Signed   By: Donavan Foil M.D.   On: 11/11/2018 00:03    Pending Labs Unresulted Labs (From admission, onward)   None      Vitals/Pain Today's Vitals   11/11/18 0215 11/11/18 0300 11/11/18 0300 11/11/18 0323  BP: (!) 144/89     Pulse: 90     Resp: (!) 23     Temp:      TempSrc:      SpO2: 96%     PainSc:  8  8  5      Isolation Precautions No active isolations  Medications Medications  albuterol (PROVENTIL) (2.5 MG/3ML) 0.083% nebulizer solution 2.5 mg (has  no administration in time range)  morphine 2 MG/ML injection 0.5 mg (has no administration  in time range)  methocarbamol (ROBAXIN) tablet 500 mg (has no administration in time range)    Or  methocarbamol (ROBAXIN) 500 mg in dextrose 5 % 50 mL IVPB (has no administration in time range)  fentaNYL (SUBLIMAZE) injection 50 mcg (50 mcg Intramuscular Given 11/10/18 2332)  fentaNYL (SUBLIMAZE) injection 50 mcg (50 mcg Intravenous Given 11/11/18 0301)    Mobility walks with person assist Low fall risk   Focused Assessments Cardiac Assessment Handoff:    Lab Results  Component Value Date   CKTOTAL 68 08/21/2015   TROPONINI <0.03 10/24/2018   Lab Results  Component Value Date   DDIMER 3.44 (H) 10/31/2018   Does the Patient currently have chest pain? No     R Recommendations: See Admitting Provider Note  Report given to:   Additional Notes:

## 2018-11-11 NOTE — Progress Notes (Signed)
Brandy [name] arrived [place], care link, Right hip fracture from fall, Recent hip surgery,   Notified MD of Pt arrival. On bed rest.  Pt is asking for sleep medicine.

## 2018-11-11 NOTE — ED Provider Notes (Signed)
Emergency Department Provider Note   I have reviewed the triage vital signs and the nursing notes.   HISTORY  Chief Complaint Fall   HPI Brandy [name] is a 85 y.o. female with multiple medical problems documented below who presents emergency department today with right hip pain for approximate 24 hours after a fall.  Patient recently (on April 20) had a intramedullary nail fixation of her right hip secondary to hip fracture by Dr. Maureen Ralphs.   [place] again last night for unclear reasons and has been ambulate since then secondary to the knee pain and right hip pain.  She presents here for further evaluation.  She states she did hit her head and has an abrasion on the left side of her scalp with some dried blood but did not lose consciousness and does not have any vomiting or other associated symptoms since that time.  She not have any pain elsewhere.  No left lower extremity pain, upper extremity pain, abdominal pain, chest pain or neck pain.  No back pain is new.  No other associated or modifying symptoms.    Past Medical History:  Diagnosis Date   Anemia    Arthritis    shoulders   Asthma    mild, no inhalers used   Breast cancer ([place]) 12/18/11   left breast masectomy=metastatic ca in (1/1) lymph node ,invasive ductal ca,2 foci,,dcis,lymph ovascular invasion identified,surgical resection margins neg for ca,additional tissue=benign skin and subcutaneous tissue   Bronchitis    hx of;last time >94yr ago   Depression    takes Celexa daily   Difficult intubation    Dysphagia    Full dentures    Gall stones 2015   GERD (gastroesophageal reflux disease)    takes Prilosec prn   H/O hiatal hernia    History of kidney stones    many yrs ago   Hx of radiation therapy 04/26/12 -06/10/12   left breast   Hyperlipidemia    takes Simvastatin daily   Hypertension    takes Amlodipine and Diovan daily   Insomnia    takes Ambien nightly   Urinary urgency     Patient  Active Problem List   Diagnosis Date Noted   Acute respiratory failure with hypoxia ([place]) 10/29/2018   S/P right hip fracture 10/24/2018   Acute on chronic diastolic CHF (congestive heart failure) ([place]) 01/19/2017   HCAP (healthcare-associated pneumonia) 12/11/2016   S/P exploratory laparotomy 11/17/2016   Small bowel obstruction ([place]) 11/17/2016   Pre-operative cardiovascular examination    Incarcerated incisional hernia s/p SB resection & repair 11/17/2016 11/15/2016   Acute renal failure (ARF) ([place]) 11/15/2016   Hyperglycemia 11/15/2016   Asthma without status asthmaticus 09/24/2016   Carpal tunnel syndrome 09/24/2016   Asthma 09/24/2016   Dark stools 09/24/2016   Degenerative arthritis of lumbar spine 09/24/2016   DJD (degenerative joint disease), cervical 09/24/2016   Dyspnea on exertion 09/24/2016   Generalized anxiety disorder 09/24/2016   Lumbosacral spondylosis without myelopathy 09/24/2016   Morbid (severe) obesity due to excess calories ([place]) 09/24/2016   Pernicious anemia 09/24/2016   Pure hypercholesterolemia 09/24/2016   Right knee pain 09/24/2016   Sleep disorder 09/24/2016   Vitamin B 12 deficiency 09/24/2016   Vitamin D deficiency 09/24/2016   Gastric ulcer    Iron deficiency anemia due to chronic blood loss    Abdominal pain, chronic, epigastric    Acute gastric ulcer without hemorrhage or perforation    Duodenal ulcer    Closed fracture  of nasal septum 08/27/2015   Closed fracture of zygomatic arch ([place]) 08/27/2015   Protein-calorie malnutrition, severe ([place]) 12/18/2013   Choledocholithiasis 12/17/2013   Sepsis ([place]) 12/17/2013   Osteopenia 11/17/2012   Moderate aortic stenosis 10/17/2012   Hx of radiation therapy    Depression    History of kidney stones    Urinary urgency    Gastroesophageal reflux disease without esophagitis    Hyperlipidemia    Essential hypertension    Bronchitis    Arthritis     Dysphagia    H/O hiatal hernia    Primary cancer of upper outer quadrant of left female breast ([place]) 12/01/2011    Past Surgical History:  Procedure Laterality Date   APPENDECTOMY     bladder tack     BREAST BIOPSY  1998   left    DILATION AND CURETTAGE OF UTERUS     ENDOSCOPIC RETROGRADE CHOLANGIOPANCREATOGRAPHY (ERCP) WITH PROPOFOL N/A 02/01/2014   Procedure: ENDOSCOPIC RETROGRADE CHOLANGIOPANCREATOGRAPHY (ERCP) WITH PROPOFOL;  Surgeon: [name], MD;  Location: [facility];  Service: Endoscopy;  Laterality: N/A;   ESOPHAGOGASTRODUODENOSCOPY     ESOPHAGOGASTRODUODENOSCOPY N/A 06/26/2016   Procedure: ESOPHAGOGASTRODUODENOSCOPY (EGD);  Surgeon: [name], MD;  Location: [facility];  Service: Endoscopy;  Laterality: N/A;   ESOPHAGOGASTRODUODENOSCOPY (EGD) WITH PROPOFOL  12/19/2013   Procedure: ESOPHAGOGASTRODUODENOSCOPY (EGD) WITH PROPOFOL;  Surgeon: Beryle Beams, MD;  Location: [facility];  Service: Endoscopy;;   ESOPHAGOGASTRODUODENOSCOPY (EGD) WITH PROPOFOL N/A 09/10/2016   Procedure: ESOPHAGOGASTRODUODENOSCOPY (EGD) WITH PROPOFOL;  Surgeon: [name], MD;  [facility];  Service: Endoscopy;  Laterality: N/A;   EUS  06/04/2011   Procedure: UPPER ENDOSCOPIC ULTRASOUND (EUS) LINEAR;  Surgeon: Owens Loffler, MD;  [facility];  Service: Endoscopy;  Laterality: N/A;   EUS N/A 02/01/2014   Procedure: UPPER ENDOSCOPIC ULTRASOUND (EUS) LINEAR;  Surgeon: [name], MD;  [facility];  Service: Endoscopy;  Laterality: N/A;   EXPLORATORY LAPAROTOMY      biopsy of intra-abdominal mass   INTRAMEDULLARY (IM) NAIL INTERTROCHANTERIC Right 10/24/2018   Procedure: INTRAMEDULLARY (IM) NAIL INTERTROCHANTRIC;  Surgeon: Gaynelle Arabian, MD;  Location: [facility];  Service: Orthopedics;  Laterality: Right;   LAPAROTOMY N/A 11/17/2016   Procedure: EXPLORATORY LAPAROTOMY WITH LYSIS OF ADHESIONS, SMALL BOWEL RESECTION, REPAIR OF INCARCERATED  VENTRAL INCISIONAL HERNIA, INSERTION OF BIOLOGIC MESH PATCH,APPLICATION OF WOUND VAC DRESSING;  Surgeon: Armandina Gemma, MD;  Location: [facility];  Service: General;  Laterality: N/A;   MASTECTOMY W/ SENTINEL NODE BIOPSY  12/18/2011   Procedure: MASTECTOMY WITH SENTINEL LYMPH NODE BIOPSY;  Surgeon: Rolm Bookbinder, MD;  Location: [facility];  Service: General;  Laterality: Left;   PORT-A-CATH REMOVAL Right 06/15/2013   Procedure: REMOVAL PORT-A-CATH;  Surgeon: Rolm Bookbinder, MD;  Location: [facility];  Service: General;  Laterality: Right;   PORTACATH PLACEMENT  01/27/2012   Procedure: INSERTION PORT-A-CATH;  Surgeon: Rolm Bookbinder, MD;  Location: [facility];  Service: General;  Laterality: Right;  PORT PLACEMENT   TOTAL SHOULDER REPLACEMENT  2011   left   TUBAL LIGATION      Current Outpatient Rx   Order #: [id number] Class: Historical Med   Order #: [id number] Class: Normal   Order #: [id number] Class: Normal   Order #: [id number] Class: Historical Med   Order #: [id number] Class: Normal   Order #: [id number] Class: Historical Med   Order #: [id number] Class: Print   Order #: [id number] Class: Normal   Order #: [id number] Class: Print   Order #:  [id number] Class: Normal   Order #: [id number] Class: Normal   Order #: [id number] Class: Historical Med   Order #: [id number] Class: Normal   Order #: [id number] Class: Normal   Order #: [id number] Class: Historical Med   Order #: [id number] Class: Historical Med    Allergies Patient has no known allergies.  Family History  Problem Relation Age of Onset   Prostate cancer Father    Breast cancer Other     Social History Social History   Tobacco Use   Smoking status: Never Smoker   Smokeless tobacco: Never Used  Substance Use Topics   Alcohol use: No   Drug use: No    Review of Systems  All other systems negative except as documented in the HPI. All pertinent positives and negatives as reviewed in  the HPI. ____________________________________________   PHYSICAL EXAM:  VITAL SIGNS: ED Triage Vitals  Enc Vitals Group     BP 11/10/18 1901 (!) 157/51     Pulse Rate 11/10/18 1901 98     Resp 11/10/18 1901 16     Temp 11/10/18 1901 98 F (36.7 C)     Temp Source 11/10/18 1901 Oral     SpO2 11/10/18 1901 98 %     Weight --      Height --      Head Circumference --      Peak Flow --      Pain Score 11/10/18 1906 3     Pain Loc --      Pain Edu? --      Excl. in [place]? --     Constitutional: Alert and oriented. Well appearing and in no acute distress. Eyes: Conjunctivae are normal. PERRL. EOMI. Head: Atraumatic. Nose: No congestion/rhinnorhea. Mouth/Throat: Mucous membranes are moist.  Oropharynx non-erythematous. Neck: No stridor.  No meningeal signs.   Cardiovascular: Normal rate, regular rhythm. Good peripheral circulation. Grossly normal heart sounds.   Respiratory: Normal respiratory effort.  No retractions. Lungs CTAB. Gastrointestinal: Soft and nontender. No distention.  Musculoskeletal: No lower extremity tenderness nor edema. No gross deformities of extremities. Right leg slightly shortened. Pain with palpation of proximal femur. No bruising or significant ttp over patella or tibia even though she feels pain there.  Neurologic:  Normal speech and language. No gross focal neurologic deficits are appreciated.  Skin:  Skin is warm, dry and intact. No rash noted.   ____________________________________________   LABS (all labs ordered are listed, but only abnormal results are displayed)  Labs Reviewed  SARS CORONAVIRUS 2 (HOSPITAL ORDER, [place] [facility])  CBC WITH DIFFERENTIAL/PLATELET  BASIC METABOLIC PANEL  PROTIME-INR  URINALYSIS, ROUTINE W REFLEX MICROSCOPIC   ____________________________________________  EKG   EKG Interpretation  Date/Time:    Ventricular Rate:    PR Interval:    QRS Duration:   QT Interval:    QTC Calculation:    R Axis:     Text Interpretation:         ____________________________________________  RADIOLOGY  Dg Pelvis 1-2 Views  Result Date: 11/11/2018 CLINICAL DATA:  Fall with hip pain EXAM: PELVIS - 1-2 VIEW COMPARISON:  10/31/2018, 10/24/2018, fluoroscopic images 422 1,020 FINDINGS: Residual contrast within the rectosigmoid colon. Interval intramedullary rodding of right femur fracture. Interval change in position of the obliquely oriented hardware with tip projecting beyond the cortical margin of the right femoral head. The base of femoral neck fracture appears more displaced. No femoral head dislocation. Vascular calcifications. IMPRESSION: Status post intramedullary rodding of proximal right femur fracture but  with interval change in appearance of the fixating hardware, obliquely oriented hardware projects over the right hip joint, above the cortical margin of the right femoral head. Base of femoral neck fracture appears more displaced compared with intraoperative views. Electronically Signed   By: Donavan Foil M.D.   On: 11/11/2018 00:00   Dg Tibia/fibula Right  Result Date: 11/11/2018 CLINICAL DATA:  Fall EXAM: RIGHT TIBIA AND FIBULA - 2 VIEW COMPARISON:  11/10/2018 knee radiograph FINDINGS: No acute displaced fracture or malalignment. Soft tissues are unremarkable. IMPRESSION: No acute osseous abnormality Electronically Signed   By: Donavan Foil M.D.   On: 11/11/2018 00:01   Ct Head Wo Contrast  Result Date: 11/11/2018 CLINICAL DATA:  Fall EXAM: CT HEAD WITHOUT CONTRAST TECHNIQUE: Contiguous axial images were obtained from the base of the skull through the vertex without intravenous contrast. COMPARISON:  10/24/2018 FINDINGS: Brain: There is atrophy and chronic small vessel disease changes. No acute intracranial abnormality. Specifically, no hemorrhage, hydrocephalus, mass lesion, acute infarction, or significant intracranial injury. Vascular: No hyperdense vessel or unexpected calcification.  Skull: No acute calvarial abnormality. Sinuses/Orbits: No acute findings Other: None IMPRESSION: Atrophy, chronic microvascular disease. No acute intracranial abnormality. Electronically Signed   By: Rolm Baptise M.D.   On: 11/11/2018 00:09   Dg Knee Complete 4 Views Right  Result Date: 11/10/2018 CLINICAL DATA:  Fall EXAM: RIGHT KNEE - COMPLETE 4+ VIEW COMPARISON:  10/24/2018 FINDINGS: No fracture or malalignment. Advanced patellofemoral degenerative changes with small knee effusion. Advanced arthritis involving the medial and lateral joint space compartments. Chondrocalcinosis. IMPRESSION: 1. No acute osseous abnormality. 2. Advanced tricompartment arthritis with chondrocalcinosis and small knee effusion Electronically Signed   By: Donavan Foil M.D.   On: 11/10/2018 19:38   Dg Femur Min 2 Views Right  Result Date: 11/11/2018 CLINICAL DATA:  Femur fracture EXAM: RIGHT FEMUR 2 VIEWS COMPARISON:  10/24/2018 FINDINGS: Intramedullary rod and distal screw fixation of right proximal femur fracture with rig tip of the right femoral neck hardware component now projecting beyond cortical margin of the right femoral head. Femoral neck fracture appears more displaced. Mid to distal femur appear intact. IMPRESSION: 1. No acute osseous abnormality of the mid to distal femur. 2. Status post internal fixation of right proximal femur fracture, now with cortical breach of the femoral head by fixating hardware Electronically Signed   By: Donavan Foil M.D.   On: 11/11/2018 00:03    ____________________________________________   PROCEDURES  Procedure(s) performed:   Procedures   ____________________________________________   INITIAL IMPRESSION / ASSESSMENT AND PLAN / ED COURSE  Abnormal x-ray as discussed above likely hardware malfunction secondary to the fall.  Discussed with Dr. Lyla Glassing who stated he would like a CT scan of her hip for surgical planning and admit to hospitalist, n.p.o. after midnight they  will see in the morning plan for operation.  I will add on a CT of her right knee as she has pain there as well just to ensure she has no tibial plateau fracture or other issues all that were this is probably just radiating from her right hip. Pre-op labs added on.  Pertinent labs & imaging results that were available during my care of the patient were reviewed by me and considered in my medical decision making (see chart for details).  ____________________________________________  FINAL CLINICAL IMPRESSION(S) / ED DIAGNOSES  Final diagnoses:  Fall, initial encounter  Pain of right hip joint     MEDICATIONS GIVEN DURING THIS VISIT:  Medications  fentaNYL (SUBLIMAZE) injection 50 mcg (50 mcg Intramuscular Given 11/10/18 2332)     NEW OUTPATIENT MEDICATIONS STARTED DURING THIS VISIT:  New Prescriptions   No medications on file    Note:  This note was prepared with assistance of Dragon voice recognition software. Occasional wrong-word or sound-a-like substitutions may have occurred due to the inherent limitations of voice recognition software.   Marchia Diguglielmo, Corene Cornea, MD 11/11/18 [phone]

## 2018-11-11 NOTE — Progress Notes (Signed)
Orthopedic Tech Progress Note Patient Details:  Brandy [name] 01/02/1933 [id number]  Ortho Devices Ortho Device/Splint Location: Trapeze bar Ortho Device/Splint Interventions: Application   Post Interventions Patient Tolerated: Well Instructions Provided: Care of device   Brandy [name] 11/11/2018, 12:29 PM

## 2018-11-11 NOTE — Consult Note (Addendum)
ORTHOPAEDIC CONSULTATION  REQUESTING PHYSICIAN: Elodia Florence., *  PCP:  Kathyrn Lass, MD  Chief Complaint: R hip pain  HPI: Brandy [name] is a 85 y.o. female who underwent right hip fracture nailing about 2 weeks ago with Dr. Wynelle Link.  She sustained a ground-level fall yesterday and had worsening of hip pain.  She was unable to weight-bear.  She was brought to the emergency department at [facility], where radiographs revealed that her hip fracture nail had cut out of the femoral head and the fracture displaced into a varus posturing.  She was admitted to the hospitalist service.  Orthopedic consultation was placed for management of her failed right hip fracture nail.  She denies other injuries.  She does complain of right hip pain.  Past Medical History:  Diagnosis Date  . Anemia   . Arthritis    shoulders  . Asthma    mild, no inhalers used  . Breast cancer ([place]) 12/18/11   left breast masectomy=metastatic ca in (1/1) lymph node ,invasive ductal ca,2 foci,,dcis,lymph ovascular invasion identified,surgical resection margins neg for ca,additional tissue=benign skin and subcutaneous tissue  . Bronchitis    hx of;last time >72yr ago  . Depression    takes Celexa daily  . Difficult intubation   . Dysphagia   . Full dentures   . Gall stones 2015  . GERD (gastroesophageal reflux disease)    takes Prilosec prn  . H/O hiatal hernia   . History of kidney stones    many yrs ago  . Hx of radiation therapy 04/26/12 -06/10/12   left breast  . Hyperlipidemia    takes Simvastatin daily  . Hypertension    takes Amlodipine and Diovan daily  . Insomnia    takes Ambien nightly  . Urinary urgency    Past Surgical History:  Procedure Laterality Date  . APPENDECTOMY    . bladder tack    . BREAST BIOPSY  1998   left   . DILATION AND CURETTAGE OF UTERUS    . ENDOSCOPIC RETROGRADE CHOLANGIOPANCREATOGRAPHY (ERCP) WITH PROPOFOL N/A 02/01/2014   Procedure: ENDOSCOPIC  RETROGRADE CHOLANGIOPANCREATOGRAPHY (ERCP) WITH PROPOFOL;  Surgeon: [name], MD;  Location: [facility];  Service: Endoscopy;  Laterality: N/A;  . ESOPHAGOGASTRODUODENOSCOPY    . ESOPHAGOGASTRODUODENOSCOPY N/A 06/26/2016   Procedure: ESOPHAGOGASTRODUODENOSCOPY (EGD);  Surgeon: [name], MD;  Location: [place];  Service: Endoscopy;  Laterality: N/A;  . ESOPHAGOGASTRODUODENOSCOPY (EGD) WITH PROPOFOL  12/19/2013   Procedure: ESOPHAGOGASTRODUODENOSCOPY (EGD) WITH PROPOFOL;  Surgeon: Beryle Beams, MD;  Location: [place];  Service: Endoscopy;;  . ESOPHAGOGASTRODUODENOSCOPY (EGD) WITH PROPOFOL N/A 09/10/2016   Procedure: ESOPHAGOGASTRODUODENOSCOPY (EGD) WITH PROPOFOL;  Surgeon: [name], MD;  [facility];  Service: Endoscopy;  Laterality: N/A;  . EUS  06/04/2011   Procedure: UPPER ENDOSCOPIC ULTRASOUND (EUS) LINEAR;  Surgeon: Owens Loffler, MD;  [facility];  Service: Endoscopy;  Laterality: N/A;  . EUS N/A 02/01/2014   Procedure: UPPER ENDOSCOPIC ULTRASOUND (EUS) LINEAR;  Surgeon: [name], MD;  [facility]: [facility];  Service: Endoscopy;  Laterality: N/A;  . EXPLORATORY LAPAROTOMY      biopsy of intra-abdominal mass  . INTRAMEDULLARY (IM) NAIL INTERTROCHANTERIC Right 10/24/2018   Procedure: INTRAMEDULLARY (IM) NAIL INTERTROCHANTRIC;  Surgeon: Gaynelle Arabian, MD;  Location: [facility];  Service: Orthopedics;  Laterality: Right;  . LAPAROTOMY N/A 11/17/2016   Procedure: EXPLORATORY LAPAROTOMY WITH LYSIS OF ADHESIONS, SMALL BOWEL RESECTION, REPAIR OF INCARCERATED VENTRAL INCISIONAL HERNIA,  INSERTION OF BIOLOGIC MESH PATCH,APPLICATION OF WOUND VAC DRESSING;  Surgeon: Armandina Gemma, MD;  Location: [facility];  Service: General;  Laterality: N/A;  . MASTECTOMY W/ SENTINEL NODE BIOPSY  12/18/2011   Procedure: MASTECTOMY WITH SENTINEL LYMPH NODE BIOPSY;  Surgeon: Rolm Bookbinder, MD;  Location: [place];  Service: General;  Laterality: Left;  . PORT-A-CATH  REMOVAL Right 06/15/2013   Procedure: REMOVAL PORT-A-CATH;  Surgeon: Rolm Bookbinder, MD;  Location: [place];  Service: General;  Laterality: Right;  . PORTACATH PLACEMENT  01/27/2012   Procedure: INSERTION PORT-A-CATH;  Surgeon: Rolm Bookbinder, MD;  Location: [place];  Service: General;  Laterality: Right;  PORT PLACEMENT  . TOTAL SHOULDER REPLACEMENT  2011   left  . TUBAL LIGATION     Social History   Socioeconomic History  . Marital status: Married    Spouse name: Not on file  . Number of children: Not on file  . Years of education: Not on file  . Highest education level: Not on file  Occupational History  . Occupation: retired  Scientific laboratory technician  . Financial resource strain: Not on file  . Food insecurity:    Worry: Patient refused    Inability: Patient refused  . Transportation needs:    Medical: Not on file    Non-medical: Not on file  Tobacco Use  . Smoking status: Never Smoker  . Smokeless tobacco: Never Used  Substance and Sexual Activity  . Alcohol use: No  . Drug use: No  . Sexual activity: Yes    Birth control/protection: Surgical    Comment: mensus age 27, 38st pregnancy 65, no hrt gg4,p3, 1 babay lived a few hours complications  Lifestyle  . Physical activity:    Days per week: Patient refused    Minutes per session: Patient refused  . Stress: Not on file  Relationships  . Social connections:    Talks on phone: Patient refused    Gets together: Patient refused    Attends religious service: Patient refused    Active member of club or organization: Patient refused    Attends meetings of clubs or organizations: Patient refused    Relationship status: Patient refused  Other Topics Concern  . Not on file  Social History Narrative  . Not on file   Family History  Problem Relation Age of Onset  . Prostate cancer Father   . Breast cancer Other    No Known Allergies Prior to Admission medications   Medication Sig Start  Date End Date Taking? Authorizing Provider  acetaminophen (TYLENOL) 500 MG tablet Take 1,000 mg by mouth every 6 (six) hours as needed (pain).    [provider]  albuterol (VENTOLIN HFA) 108 (90 Base) MCG/ACT inhaler Inhale 2 puffs into the lungs every 6 (six) hours. 11/01/18   Regalado, Jerald Kief A, MD  aspirin EC 325 MG tablet Take 1 tablet (325 mg total) by mouth 2 (two) times daily for 21 days. Then take one 81 mg aspirin once a day for three weeks. Then discontinue aspirin. Patient taking differently: Take 325 mg by mouth See admin instructions. Start 10/26/18: Take one tablet (325 mg) by mouth twice daily for 21 days, then take one 81 mg aspirin once a day for three weeks, then discontinue aspirin. 10/25/18 11/15/18  Edmisten, Ok Anis, PA  atorvastatin (LIPITOR) 40 MG tablet Take 40 mg by mouth daily.  04/27/16   [provider]  citalopram (CELEXA) 20 MG tablet Take 1 tablet (20  mg total) by mouth daily. 11/01/18   Regalado, Belkys A, MD  Cyanocobalamin (VITAMIN B-12 PO) Take 1 tablet by mouth daily.    [provider]  docusate sodium (COLACE) 100 MG capsule Take 1 capsule (100 mg total) by mouth daily. Patient taking differently: Take 100 mg by mouth daily as needed (constipation).  10/26/18   Rai, Vernelle Emerald, MD  ferrous sulfate 325 (65 FE) MG tablet Take 1 tablet (325 mg total) by mouth 2 (two) times daily with a meal. 11/01/18   Regalado, Belkys A, MD  gabapentin (NEURONTIN) 300 MG capsule Take 1 capsule (300 mg total) by mouth at bedtime. 10/26/18   Rai, Vernelle Emerald, MD  HYDROcodone-acetaminophen (NORCO/VICODIN) 5-325 MG tablet Take 1 tablet by mouth every 6 (six) hours as needed for severe pain. 10/25/18   Edmisten, Ok Anis, PA  methocarbamol (ROBAXIN) 500 MG tablet Take 1 tablet (500 mg total) by mouth every 6 (six) hours as needed for muscle spasms. 10/25/18   Edmisten, Ok Anis, PA  Multiple Vitamin (MULTIVITAMIN WITH MINERALS) TABS tablet Take 1 tablet by mouth  daily.    [provider]  omeprazole (PRILOSEC) 40 MG capsule TAKE 1 CAPSULE (40 MG TOTAL) BY MOUTH 2 (TWO) TIMES DAILY BEFORE A MEAL. 05/20/18   [name], MD  phenol (CHLORASEPTIC) 1.4 % LIQD Use as directed 1 spray in the mouth or throat as needed for throat irritation / pain. 11/01/18   Regalado, Belkys A, MD  traMADol (ULTRAM) 50 MG tablet Take 100 mg by mouth 4 (four) times daily.  10/08/18   [provider]  Vitamin D, Ergocalciferol, (DRISDOL) 50000 units CAPS capsule Take 50,000 Units by mouth every Thursday.     [provider]   Dg Pelvis 1-2 Views  Result Date: 11/11/2018 CLINICAL DATA:  Fall with hip pain EXAM: PELVIS - 1-2 VIEW COMPARISON:  10/31/2018, 10/24/2018, fluoroscopic images 403 1,020 FINDINGS: Residual contrast within the rectosigmoid colon. Interval intramedullary rodding of right femur fracture. Interval change in position of the obliquely oriented hardware with tip projecting beyond the cortical margin of the right femoral head. The base of femoral neck fracture appears more displaced. No femoral head dislocation. Vascular calcifications. IMPRESSION: Status post intramedullary rodding of proximal right femur fracture but with interval change in appearance of the fixating hardware, obliquely oriented hardware projects over the right hip joint, above the cortical margin of the right femoral head. Base of femoral neck fracture appears more displaced compared with intraoperative views. Electronically Signed   By: Donavan Foil M.D.   On: 11/11/2018 00:00   Dg Tibia/fibula Right  Result Date: 11/11/2018 CLINICAL DATA:  Fall EXAM: RIGHT TIBIA AND FIBULA - 2 VIEW COMPARISON:  11/10/2018 knee radiograph FINDINGS: No acute displaced fracture or malalignment. Soft tissues are unremarkable. IMPRESSION: No acute osseous abnormality Electronically Signed   By: Donavan Foil M.D.   On: 11/11/2018 00:01   Ct Head Wo Contrast  Result Date: 11/11/2018 CLINICAL  DATA:  Fall EXAM: CT HEAD WITHOUT CONTRAST TECHNIQUE: Contiguous axial images were obtained from the base of the skull through the vertex without intravenous contrast. COMPARISON:  10/24/2018 FINDINGS: Brain: There is atrophy and chronic small vessel disease changes. No acute intracranial abnormality. Specifically, no hemorrhage, hydrocephalus, mass lesion, acute infarction, or significant intracranial injury. Vascular: No hyperdense vessel or unexpected calcification. Skull: No acute calvarial abnormality. Sinuses/Orbits: No acute findings Other: None IMPRESSION: Atrophy, chronic microvascular disease. No acute intracranial abnormality. Electronically Signed   By: Lennette Bihari  [place] M.D.   On: 11/11/2018 00:09   Ct Knee Right Wo Contrast  Result Date: 11/11/2018 CLINICAL DATA:  Fall.  Right knee pain EXAM: CT OF THE RIGHT KNEE WITHOUT CONTRAST TECHNIQUE: Multidetector CT imaging of the right knee was performed according to the standard protocol. Multiplanar CT image reconstructions were also generated. COMPARISON:  Plain films earlier today FINDINGS: Advanced tricompartment degenerative changes within the right knee with joint space loss and extensive spurring. Chondrocalcinosis noted. Small to moderate joint effusion within the right knee. No acute bony abnormality. No fracture, subluxation or dislocation. IMPRESSION: Advanced tricompartment osteoarthritis with small to moderate joint effusion. No acute bony abnormality. Electronically Signed   By: Rolm Baptise M.D.   On: 11/11/2018 03:15   Ct Hip Right Wo Contrast  Result Date: 11/11/2018 CLINICAL DATA:  Fall, hip trauma. Change in hip hardware since prior study. EXAM: CT OF THE RIGHT HIP WITHOUT CONTRAST TECHNIQUE: Multidetector CT imaging of the right hip was performed according to the standard protocol. Multiplanar CT image reconstructions were also generated. COMPARISON:  Plain-film tonight and intraoperative imaging from 10/24/2018 FINDINGS: There is a  comminuted right proximal femoral fracture noted involving the femoral neck and lesser trochanter. Since prior prior intraoperative imaging, there is increasing displacement and varus angulation. The femoral neck/head screw now passes through the femoral head and into the superior acetabular wall. Contrast within the sigmoid colon. No free fluid or free air. Urinary bladder unremarkable. IMPRESSION: Change in the appearance of the right femoral neck fracture and hardware. Increasing varus angulation and displacement of fracture fragments, compatible with re-injury. The femoral neck/head screw now passes through the femoral head and into the superior acetabular fall. Electronically Signed   By: Rolm Baptise M.D.   On: 11/11/2018 03:14   Dg Chest Port 1 View  Result Date: 11/11/2018 CLINICAL DATA:  85 y/o  F; preop chest radiograph. EXAM: PORTABLE CHEST 1 VIEW COMPARISON:  10/29/2018 CT chest and chest radiograph. FINDINGS: Normal cardiac silhouette. Aortic atherosclerosis with calcification. Stable ill-defined left upper lobe opacity better characterized on prior CT chest. No new consolidation. No pleural effusion or pneumothorax. Severe osteoarthrosis of the right shoulder joint with loss of the acromial humeral distance. Left shoulder arthroplasty partially visualized. IMPRESSION: No new acute pulmonary process identified. Stable ill-defined left upper lobe opacity, follow-up as per prior CT chest. Electronically Signed   By: Kristine Garbe M.D.   On: 11/11/2018 01:25   Dg Knee Complete 4 Views Right  Result Date: 11/10/2018 CLINICAL DATA:  Fall EXAM: RIGHT KNEE - COMPLETE 4+ VIEW COMPARISON:  10/24/2018 FINDINGS: No fracture or malalignment. Advanced patellofemoral degenerative changes with small knee effusion. Advanced arthritis involving the medial and lateral joint space compartments. Chondrocalcinosis. IMPRESSION: 1. No acute osseous abnormality. 2. Advanced tricompartment arthritis with  chondrocalcinosis and small knee effusion Electronically Signed   By: Donavan Foil M.D.   On: 11/10/2018 19:38   Dg Femur Min 2 Views Right  Result Date: 11/11/2018 CLINICAL DATA:  Femur fracture EXAM: RIGHT FEMUR 2 VIEWS COMPARISON:  10/24/2018 FINDINGS: Intramedullary rod and distal screw fixation of right proximal femur fracture with rig tip of the right femoral neck hardware component now projecting beyond cortical margin of the right femoral head. Femoral neck fracture appears more displaced. Mid to distal femur appear intact. IMPRESSION: 1. No acute osseous abnormality of the mid to distal femur. 2. Status post internal fixation of right proximal femur fracture, now with cortical breach of the femoral head by fixating hardware  Electronically Signed   By: Donavan Foil M.D.   On: 11/11/2018 00:03    Positive ROS: All other systems have been reviewed and were otherwise negative with the exception of those mentioned in the HPI and as above.  Physical Exam: General: Alert, no acute distress Cardiovascular: No pedal edema Respiratory: No cyanosis, no use of accessory musculature GI: No organomegaly, abdomen is soft and non-tender Skin: No lesions in the area of chief complaint Neurologic: Sensation intact distally Psychiatric: Patient is competent for consent with normal mood and affect Lymphatic: No axillary or cervical lymphadenopathy  MUSCULOSKELETAL: Examination of the right hip reveals benign surgical incisions.  She is shortened and rotated.  She has palpable pulses.  There is no focal motor or sensory deficit.  Assessment: Failed right hip fracture nail ABLA  Plan: I discussed the findings with the patient.  She has a failed right hip fracture nail.  Bedrest for now.  Nonweightbearing right lower extremity.  Dr. Wynelle Link plans for hardware removal and conversion to right total hip arthroplasty on Monday.  He requests transfer to [facility].  N.p.o. after midnight on Sunday  night.  Hold chemical DVT prophylaxis after Sunday a.m. dose.  Orders for surgical consent placed.  The surgery has been posted at [facility].  Recommend transfusion of 2 units PRBCs in preparation for surgery once she arrives at [facility], but I do not want to hold up her transfer. All questions have been solicited and answered.    Bertram Savin, MD Cell [phone]    11/11/2018 10:41 AM

## 2018-11-11 NOTE — Progress Notes (Signed)
Initial Nutrition Assessment  RD working remotely.  DOCUMENTATION CODES:   Not applicable  INTERVENTION:   -Once diet is advanced, add:  -MVI with minerals daily -Boost Plus BID, each supplement provides 360 kcals and 14 grams protein  NUTRITION DIAGNOSIS:   Increased nutrient needs related to post-op healing as evidenced by estimated needs.  GOAL:   Patient will meet greater than or equal to 90% of their needs  MONITOR:   PO intake, Supplement acceptance, Diet advancement, Labs, Weight trends, Skin, I & O's  REASON FOR ASSESSMENT:   Consult Hip fracture protocol  ASSESSMENT:   Brandy [name] is a 85 y.o. female with history of moderate aortic stenosis, hypertension, chronic anemia, breast cancer was recently admitted for right hip fracture had undergone surgery following which patient was admitted again for respiratory failure secondary to pneumonia had a fall at home on Nov 09, 2018 2 days ago.  Patient states she tripped and fell and also at the same time hit her head.  Did not lose consciousness.  Did not have any chest pain shortness of breath nausea vomiting or diarrhea.  Since the fall patient has been having persistent pain in the right hip and decided to come to the ER last evening.  Pt admitted with rt hip fracture s/p mechanical fall. Noted pt has hospitalized last month for rt hip fracture in same place.   Pt NPO in anticipation for surgery today.   Per MD notes, pt's COVID-19 test was negative.   Reviewed I/O's: -100 ml x 24 hours  UOP: 100 ml x 24 hours  Attempted to speak to pt by phone to obtain additional history, however, no answer. Phone message reported "call cannot be completed as dialed".   Reviewed previous RD note from prior admission. Pt has a distant history of severe malnutrition and has consumed Boost Plus supplements in past.   Wt has been stable over the past month, however, noted mild history of distant wt loss over the past 2 years.    Due to increased nutrient needs for post-op healing, pt would benefit from addition of oral nutrition supplements.   Labs reviewed.   NUTRITION - FOCUSED PHYSICAL EXAM:    Most Recent Value  Orbital Region  Unable to assess  Upper Arm Region  Unable to assess  Thoracic and Lumbar Region  Unable to assess  Buccal Region  Unable to assess  Temple Region  Unable to assess  Clavicle Bone Region  Unable to assess  Clavicle and Acromion Bone Region  Unable to assess  Scapular Bone Region  Unable to assess  Dorsal Hand  Unable to assess  Patellar Region  Unable to assess  Anterior Thigh Region  Unable to assess  Posterior Calf Region  Unable to assess  Edema (RD Assessment)  Unable to assess  Hair  Unable to assess  Eyes  Unable to assess  Mouth  Unable to assess  Skin  Unable to assess  Nails  Unable to assess       Diet Order:   Diet Order            Diet NPO time specified  Diet effective now              EDUCATION NEEDS:   No education needs have been identified at this time  Skin:  Skin Assessment: Skin Integrity Issues: Skin Integrity Issues:: Incisions Incisions: closed rt hip  Last BM:  11/10/18  Height:   Ht Readings from Last  1 Encounters:  11/11/18 4' 11.02" (1.499 m)    Weight:   Wt Readings from Last 1 Encounters:  11/11/18 64.7 kg    Ideal Body Weight:  44.5 kg  BMI:  Body mass index is 28.79 kg/m.  Estimated Nutritional Needs:   Kcal:  1600-1800  Protein:  80-95 grams  Fluid:  > 1.6 L    Tayson Schnelle A. Jimmye Norman, RD, LDN, [place] Registered Dietitian II Certified Diabetes Care and Education Specialist Pager: [phone] After hours Pager: [phone]

## 2018-11-11 NOTE — ED Notes (Signed)
Patient transported to CT 

## 2018-11-11 NOTE — TOC Initial Note (Signed)
Transition of Care [facility]) - Initial/Assessment Note    Patient Details  Name: Brandy [name] MRN: [id number] Date of Birth: 11-Jan-1933  Transition of Care [facility]) CM/SW Contact:    Zenon Mayo, RN Phone Number: 11/11/2018, 10:34 AM  Clinical Narrative:                 Patient from home with spouse,  Active with [facility] for RN, PT, OT, s/p R hip Fx, she had fall at home , come back [facility] hospital secondary to pain, xray shows R hip fx again at site of recent surgery.  She most likely will need SNF at discharge.   Expected Discharge Plan: Skilled Nursing Facility Barriers to Discharge: No Barriers Identified   Patient Goals and CMS Choice        Expected Discharge Plan and Services Expected Discharge Plan: [place] In-house Referral: Clinical Social Work Discharge Planning Services: CM Consult   Living arrangements for the past 2 months: [place]                 DME Arranged: N/A DME Agency: NA                  Prior Living Arrangements/Services Living arrangements for the past 2 months: [place] Lives with:: Spouse Patient language and need for interpreter reviewed:: Yes        Need for Family Participation in Patient Care: Yes (Comment) Care giver support system in place?: Yes (comment) Current home services: DME, Home PT, Home RN, Home OT Criminal Activity/Legal Involvement Pertinent to Current Situation/Hospitalization: No - Comment as needed  Activities of Daily Living Home Assistive Devices/Equipment: Environmental consultant (specify type), Cane (specify quad or straight) ADL Screening (condition at time of admission) Patient's cognitive ability adequate to safely complete daily activities?: Yes Is the patient deaf or have difficulty hearing?: No Does the patient have difficulty seeing, even when wearing glasses/contacts?: No Does the patient have difficulty concentrating, remembering, or making decisions?: Yes Patient able to express  need for assistance with ADLs?: Yes Does the patient have difficulty dressing or bathing?: Yes Independently performs ADLs?: No Communication: Independent Dressing (OT): Needs assistance Is this a change from baseline?: Pre-admission baseline Grooming: Needs assistance Is this a change from baseline?: Pre-admission baseline Feeding: Independent Bathing: Dependent Is this a change from baseline?: Pre-admission baseline Toileting: Dependent Is this a change from baseline?: Pre-admission baseline In/Out Bed: Dependent Is this a change from baseline?: Pre-admission baseline Walks in Home: Dependent Is this a change from baseline?: Pre-admission baseline Does the patient have difficulty walking or climbing stairs?: Yes Weakness of Legs: Right Weakness of Arms/Hands: None  Permission Sought/Granted                  Emotional Assessment              Admission diagnosis:  Pre-op chest exam [T06.269] Fall, initial encounter [W19.XXXA] Pain of right hip joint [M25.551] Hip fracture ([place]) [S72.009A] Patient Active Problem List   Diagnosis Date Noted  . Hip fracture ([place]) 11/11/2018  . Acute respiratory failure with hypoxia ([employer]) 10/29/2018  . S/P right hip fracture 10/24/2018  . Acute on chronic diastolic CHF (congestive heart failure) ([place]) 01/19/2017  . HCAP (healthcare-associated pneumonia) 12/11/2016  . S/P exploratory laparotomy 11/17/2016  . Small bowel obstruction ([place]) 11/17/2016  . Pre-operative cardiovascular examination   . Incarcerated incisional hernia s/p SB resection & repair 11/17/2016 11/15/2016  . Acute renal failure (ARF) ([facility])  11/15/2016  . Hyperglycemia 11/15/2016  . Asthma without status asthmaticus 09/24/2016  . Carpal tunnel syndrome 09/24/2016  . Asthma 09/24/2016  . Dark stools 09/24/2016  . Degenerative arthritis of lumbar spine 09/24/2016  . DJD (degenerative joint disease), cervical 09/24/2016  . Dyspnea on exertion 09/24/2016  .  Generalized anxiety disorder 09/24/2016  . Lumbosacral spondylosis without myelopathy 09/24/2016  . Morbid (severe) obesity due to excess calories ([place]) 09/24/2016  . Pernicious anemia 09/24/2016  . Pure hypercholesterolemia 09/24/2016  . Right knee pain 09/24/2016  . Sleep disorder 09/24/2016  . Vitamin B 12 deficiency 09/24/2016  . Vitamin D deficiency 09/24/2016  . Gastric ulcer   . Iron deficiency anemia due to chronic blood loss   . Abdominal pain, chronic, epigastric   . Acute gastric ulcer without hemorrhage or perforation   . Duodenal ulcer   . Closed fracture of nasal septum 08/27/2015  . Closed fracture of zygomatic arch ([place]) 08/27/2015  . Protein-calorie malnutrition, severe ([place]) 12/18/2013  . Choledocholithiasis 12/17/2013  . Sepsis ([place]) 12/17/2013  . Osteopenia 11/17/2012  . Moderate aortic stenosis 10/17/2012  . Hx of radiation therapy   . Depression   . History of kidney stones   . Urinary urgency   . Gastroesophageal reflux disease without esophagitis   . Hyperlipidemia   . Essential hypertension   . Bronchitis   . Arthritis   . Dysphagia   . H/O hiatal hernia   . Primary cancer of upper outer quadrant of left female breast ([place]) 12/01/2011   PCP:  Kathyrn Lass, MD Pharmacy:   [id number] - [place], [place] [address] Phone: [phone] Fax: [phone]     Social Determinants of Health (SDOH) Interventions    Readmission Risk Interventions No flowsheet data found.

## 2018-11-12 LAB — CBC
HCT: 34.3 % — ABNORMAL LOW (ref 36.0–46.0)
Hemoglobin: 10.6 g/dL — ABNORMAL LOW (ref 12.0–15.0)
MCH: 29.6 pg (ref 26.0–34.0)
MCHC: 30.9 g/dL (ref 30.0–36.0)
MCV: 95.8 fL (ref 80.0–100.0)
Platelets: 236 10*3/uL (ref 150–400)
RBC: 3.58 MIL/uL — ABNORMAL LOW (ref 3.87–5.11)
RDW: 14.3 % (ref 11.5–15.5)
WBC: 5.3 10*3/uL (ref 4.0–10.5)
nRBC: 0 % (ref 0.0–0.2)

## 2018-11-12 MED ORDER — PNEUMOCOCCAL VAC POLYVALENT 25 MCG/0.5ML IJ INJ
0.5000 mL | INJECTION | INTRAMUSCULAR | Status: DC
  Filled 2018-11-12: qty 0.5

## 2018-11-12 NOTE — Progress Notes (Signed)
   Subjective: Recheck right hip/leg s/p fall yesterday Plan for surgery on Monday with Dr. Wynelle Link Pain is mild to moderate Denies any new symptoms or issues  Patient reports pain as moderate.  Objective:   VITALS:   Vitals:   11/12/18 0748 11/12/18 0754  BP:    Pulse:    Resp:    Temp:    SpO2: 92% 92%    Right lower extremity slightly shortened nv intact distally No rashes or edema  Not taken thru any rom due to know failed IM nail  LABS Recent Labs    11/11/18 0150 11/12/18 0753  HGB 8.6* 10.6*  HCT 28.5* 34.3*  WBC 9.9 5.3  PLT 330 236    Recent Labs    11/11/18 0150  NA 141  K 4.0  BUN 20  CREATININE 0.86  GLUCOSE 121*     Assessment/Plan:  right hip IM nail failure Plan for surgery on Monday NPO Sunday at midnight Pain management Strict bedrest    Merla Riches PA-C, [place] is now [facility]  [place] [address] [place], [place], [place] [address] Phone: [phone] [email or web address] Facebook  [employer]

## 2018-11-12 NOTE — Progress Notes (Signed)
PROGRESS NOTE    Brandy [name]  NWG:[id number] DOB: 08-17-1932 DOA: 11/10/2018 PCP: Kathyrn Lass, MD   Brief Narrative: As per HPI: 85 y.o. female with history of moderate aortic stenosis, hypertension, chronic anemia, breast cancer was recently admitted for right hip fracture had undergone surgery following which patient was admitted again for respiratory failure secondary to pneumonia had a fall at home on Nov 09, 2018 2 days ago.  Patient states she tripped and fell and also at the same time hit her head.  Did not lose consciousness.  Did not have any chest pain shortness of breath nausea vomiting or diarrhea.  Since the fall patient has been having persistent pain in the right hip and decided to come to the ER last evening.  ED Course: In the ER work-up shows right hip fracture again at the site of recent surgery and on-call orthopedic surgeon was consulted.  Lab work show creatinine of 0.8 sodium 141 potassium 4 hemoglobin 8.6 platelets 330 EKG was normal sinus rhythm a chest x-ray shows persistent opacity which will need further work-up as outpatient with CT scan.  CT head was unremarkable CT of the right hip and pelvis was showing fracture of the right hip around the femoral neck.  Pain is controlled. On bed rest. Subjective: Resting comfortably denies any pain.  On bedrest.  Assessment & Plan:   Right Hip fracture closed w Right IM nail failure: Appreciate orthopedic input, noted plan for surgery on Monday, continue on pain control, strict bedrest.  Refer to HPI for risk assessment.  Essential hypertension: Blood pressure well controlled, meds on hold.  Moderate aortic stenosis : Currently asymptomatic with no shortness of breath or chest pain.  Chronic anemis from B12 deficiency and iron deficiency on supplements.  Hemoglobin stable 10.6 g, no indication for transfusion.  HLD- on statins  Recently treated for pneumonia chest x-ray showed persistent opacity and need outpatient CAT  scan.  COVID 19 Negative  DVT prophylaxis: SCD Code Status: DNR Family Communication: discussed w patient. family aware Disposition Plan: remains inpatient pending clinical improvement.  Consultants:  Orthopedics  Procedures:  Antimicrobials: Anti-infectives (From admission, onward)   None       Objective: Vitals:   11/12/18 0223 11/12/18 0521 11/12/18 0748 11/12/18 0754  BP: (!) 156/66 (!) 129/58    Pulse: 74 78    Resp: 16 20    Temp: (!) 97.5 F (36.4 C) 97.9 F (36.6 C)    TempSrc: Oral Oral    SpO2: 96% 97% 92% 92%  Weight:      Height:        Intake/Output Summary (Last 24 hours) at 11/12/2018 1129 Last data filed at 11/12/2018 0000 Gross per 24 hour  Intake 250 ml  Output --  Net 250 ml   Filed Weights   11/11/18 0512 11/11/18 2021  Weight: 64.7 kg 63.5 kg   Weight change: -1.242 kg  Body mass index is 28.26 kg/m.  Intake/Output from previous day: 05/08 0701 - 05/09 0700 In: 250 [P.O.:250] Out: -  Intake/Output this shift: No intake/output data recorded.  Examination:  General exam: Appears calm and comfortable,Not in distress, older fore the age HEENT:PERRL,Oral mucosa moist, Ear/Nose normal on gross exam Respiratory system: Bilateral equal air entry, normal vesicular breath sounds, no wheezes or crackles  Cardiovascular system: S1 & S2 heard,No JVD, murmurs. Gastrointestinal system: Abdomen is  soft, non tender, non distended, BS +  Nervous System:Alert and oriented. No focal neurological deficits/moving extremities, sensation  intact. Extremities: rt hip shortened and tender, No edema, no clubbing, distal peripheral pulses palpable. Skin: No rashes, lesions, no icterus MSK: Normal muscle bulk,tone ,power  Medications:  Scheduled Meds:  acetaminophen  650 mg Oral Q6H   albuterol  2.5 mg Inhalation Q6H   atorvastatin  40 mg Oral Daily   citalopram  20 mg Oral Daily   enoxaparin (LOVENOX) injection  40 mg Subcutaneous Q24H    ferrous sulfate  325 mg Oral Daily   gabapentin  300 mg Oral QHS   lactose free nutrition  237 mL Oral BID BM   multivitamin with minerals  1 tablet Oral Daily   pantoprazole  40 mg Oral Daily   [START ON 11/13/2018] pneumococcal 23 valent vaccine  0.5 mL Intramuscular Tomorrow-1000   polyethylene glycol  17 g Oral Daily   Continuous Infusions:  methocarbamol (ROBAXIN) IV      Data Reviewed: I have personally reviewed following labs and imaging studies  CBC: Recent Labs  Lab 11/11/18 0150 11/12/18 0753  WBC 9.9 5.3  NEUTROABS 7.6  --   HGB 8.6* 10.6*  HCT 28.5* 34.3*  MCV 98.3 95.8  PLT 330 967   Basic Metabolic Panel: Recent Labs  Lab 11/11/18 0150  NA 141  K 4.0  CL 108  CO2 22  GLUCOSE 121*  BUN 20  CREATININE 0.86  CALCIUM 9.2   GFR: Estimated Creatinine Clearance: 40.1 mL/min (by C-G formula based on SCr of 0.86 mg/dL). Liver Function Tests: No results for input(s): AST, ALT, ALKPHOS, BILITOT, PROT, ALBUMIN in the last 168 hours. No results for input(s): LIPASE, AMYLASE in the last 168 hours. No results for input(s): AMMONIA in the last 168 hours. Coagulation Profile: Recent Labs  Lab 11/11/18 0303  INR 1.2   Cardiac Enzymes: No results for input(s): CKTOTAL, CKMB, CKMBINDEX, TROPONINI in the last 168 hours. BNP (last 3 results) No results for input(s): PROBNP in the last 8760 hours. HbA1C: No results for input(s): HGBA1C in the last 72 hours. CBG: No results for input(s): GLUCAP in the last 168 hours. Lipid Profile: No results for input(s): CHOL, HDL, LDLCALC, TRIG, CHOLHDL, LDLDIRECT in the last 72 hours. Thyroid Function Tests: No results for input(s): TSH, T4TOTAL, FREET4, T3FREE, THYROIDAB in the last 72 hours. Anemia Panel: No results for input(s): VITAMINB12, FOLATE, FERRITIN, TIBC, IRON, RETICCTPCT in the last 72 hours. Sepsis Labs: No results for input(s): PROCALCITON, LATICACIDVEN in the last 168 hours.  Recent Results (from the  past 240 hour(s))  SARS Coronavirus 2 (CEPHEID - Performed in [place] hospital lab), Hosp Order     Status: None   Collection Time: 11/11/18  1:10 AM  Result Value Ref Range Status   SARS Coronavirus 2 NEGATIVE NEGATIVE Final    Comment: (NOTE) If result is NEGATIVE SARS-CoV-2 target nucleic acids are NOT DETECTED. The SARS-CoV-2 RNA is generally detectable in upper and lower  respiratory specimens during the acute phase of infection. The lowest  concentration of SARS-CoV-2 viral copies this assay can detect is 250  copies / mL. A negative result does not preclude SARS-CoV-2 infection  and should not be used as the sole basis for treatment or other  patient management decisions.  A negative result may occur with  improper specimen collection / handling, submission of specimen other  than nasopharyngeal swab, presence of viral mutation(s) within the  areas targeted by this assay, and inadequate number of viral copies  (<250 copies / mL). A negative result must be  combined with clinical  observations, patient history, and epidemiological information. If result is POSITIVE SARS-CoV-2 target nucleic acids are DETECTED. The SARS-CoV-2 RNA is generally detectable in upper and lower  respiratory specimens dur ing the acute phase of infection.  Positive  results are indicative of active infection with SARS-CoV-2.  Clinical  correlation with patient history and other diagnostic information is  necessary to determine patient infection status.  Positive results do  not rule out bacterial infection or co-infection with other viruses. If result is PRESUMPTIVE POSTIVE SARS-CoV-2 nucleic acids MAY BE PRESENT.   A presumptive positive result was obtained on the submitted specimen  and confirmed on repeat testing.  While 2019 novel coronavirus  (SARS-CoV-2) nucleic acids may be present in the submitted sample  additional confirmatory testing may be necessary for epidemiological  and / or  clinical management purposes  to differentiate between  SARS-CoV-2 and other Sarbecovirus currently known to infect humans.  If clinically indicated additional testing with an alternate test  methodology [phone]) is advised. The SARS-CoV-2 RNA is generally  detectable in upper and lower respiratory sp ecimens during the acute  phase of infection. The expected result is Negative. Fact Sheet for Patients:  StrictlyIdeas.no Fact Sheet for Healthcare Providers: BankingDealers.co.za This test is not yet approved or cleared by the [place] FDA and has been authorized for detection and/or diagnosis of SARS-CoV-2 by FDA under an Emergency Use Authorization (EUA).  This EUA will remain in effect (meaning this test can be used) for the duration of the COVID-19 declaration under Section 564(b)(1) of the Act, 21 U.S.C. section 360bbb-3(b)(1), unless the authorization is terminated or revoked sooner. Performed at [facility], [place] [address] [place], [place] [address]       Radiology Studies: Dg Pelvis 1-2 Views  Result Date: 11/11/2018 CLINICAL DATA:  Fall with hip pain EXAM: PELVIS - 1-2 VIEW COMPARISON:  10/31/2018, 10/24/2018, fluoroscopic images 422 1,020 FINDINGS: Residual contrast within the rectosigmoid colon. Interval intramedullary rodding of right femur fracture. Interval change in position of the obliquely oriented hardware with tip projecting beyond the cortical margin of the right femoral head. The base of femoral neck fracture appears more displaced. No femoral head dislocation. Vascular calcifications. IMPRESSION: Status post intramedullary rodding of proximal right femur fracture but with interval change in appearance of the fixating hardware, obliquely oriented hardware projects over the right hip joint, above the cortical margin of the right femoral head. Base of femoral neck fracture appears more displaced compared with  intraoperative views. Electronically Signed   By: Donavan Foil M.D.   On: 11/11/2018 00:00   Dg Tibia/fibula Right  Result Date: 11/11/2018 CLINICAL DATA:  Fall EXAM: RIGHT TIBIA AND FIBULA - 2 VIEW COMPARISON:  11/10/2018 knee radiograph FINDINGS: No acute displaced fracture or malalignment. Soft tissues are unremarkable. IMPRESSION: No acute osseous abnormality Electronically Signed   By: Donavan Foil M.D.   On: 11/11/2018 00:01   Ct Head Wo Contrast  Result Date: 11/11/2018 CLINICAL DATA:  Fall EXAM: CT HEAD WITHOUT CONTRAST TECHNIQUE: Contiguous axial images were obtained from the base of the skull through the vertex without intravenous contrast. COMPARISON:  10/24/2018 FINDINGS: Brain: There is atrophy and chronic small vessel disease changes. No acute intracranial abnormality. Specifically, no hemorrhage, hydrocephalus, mass lesion, acute infarction, or significant intracranial injury. Vascular: No hyperdense vessel or unexpected calcification. Skull: No acute calvarial abnormality. Sinuses/Orbits: No acute findings Other: None IMPRESSION: Atrophy, chronic microvascular disease. No acute intracranial abnormality. Electronically Signed  By: Rolm Baptise M.D.   On: 11/11/2018 00:09   Ct Knee Right Wo Contrast  Result Date: 11/11/2018 CLINICAL DATA:  Fall.  Right knee pain EXAM: CT OF THE RIGHT KNEE WITHOUT CONTRAST TECHNIQUE: Multidetector CT imaging of the right knee was performed according to the standard protocol. Multiplanar CT image reconstructions were also generated. COMPARISON:  Plain films earlier today FINDINGS: Advanced tricompartment degenerative changes within the right knee with joint space loss and extensive spurring. Chondrocalcinosis noted. Small to moderate joint effusion within the right knee. No acute bony abnormality. No fracture, subluxation or dislocation. IMPRESSION: Advanced tricompartment osteoarthritis with small to moderate joint effusion. No acute bony abnormality.  Electronically Signed   By: Rolm Baptise M.D.   On: 11/11/2018 03:15   Ct Hip Right Wo Contrast  Result Date: 11/11/2018 CLINICAL DATA:  Fall, hip trauma. Change in hip hardware since prior study. EXAM: CT OF THE RIGHT HIP WITHOUT CONTRAST TECHNIQUE: Multidetector CT imaging of the right hip was performed according to the standard protocol. Multiplanar CT image reconstructions were also generated. COMPARISON:  Plain-film tonight and intraoperative imaging from 10/24/2018 FINDINGS: There is a comminuted right proximal femoral fracture noted involving the femoral neck and lesser trochanter. Since prior prior intraoperative imaging, there is increasing displacement and varus angulation. The femoral neck/head screw now passes through the femoral head and into the superior acetabular wall. Contrast within the sigmoid colon. No free fluid or free air. Urinary bladder unremarkable. IMPRESSION: Change in the appearance of the right femoral neck fracture and hardware. Increasing varus angulation and displacement of fracture fragments, compatible with re-injury. The femoral neck/head screw now passes through the femoral head and into the superior acetabular fall. Electronically Signed   By: Rolm Baptise M.D.   On: 11/11/2018 03:14   Dg Chest Port 1 View  Result Date: 11/11/2018 CLINICAL DATA:  85 y/o  F; preop chest radiograph. EXAM: PORTABLE CHEST 1 VIEW COMPARISON:  10/29/2018 CT chest and chest radiograph. FINDINGS: Normal cardiac silhouette. Aortic atherosclerosis with calcification. Stable ill-defined left upper lobe opacity better characterized on prior CT chest. No new consolidation. No pleural effusion or pneumothorax. Severe osteoarthrosis of the right shoulder joint with loss of the acromial humeral distance. Left shoulder arthroplasty partially visualized. IMPRESSION: No new acute pulmonary process identified. Stable ill-defined left upper lobe opacity, follow-up as per prior CT chest. Electronically Signed    By: Kristine Garbe M.D.   On: 11/11/2018 01:25   Dg Knee Complete 4 Views Right  Result Date: 11/10/2018 CLINICAL DATA:  Fall EXAM: RIGHT KNEE - COMPLETE 4+ VIEW COMPARISON:  10/24/2018 FINDINGS: No fracture or malalignment. Advanced patellofemoral degenerative changes with small knee effusion. Advanced arthritis involving the medial and lateral joint space compartments. Chondrocalcinosis. IMPRESSION: 1. No acute osseous abnormality. 2. Advanced tricompartment arthritis with chondrocalcinosis and small knee effusion Electronically Signed   By: Donavan Foil M.D.   On: 11/10/2018 19:38   Dg Femur Min 2 Views Right  Result Date: 11/11/2018 CLINICAL DATA:  Femur fracture EXAM: RIGHT FEMUR 2 VIEWS COMPARISON:  10/24/2018 FINDINGS: Intramedullary rod and distal screw fixation of right proximal femur fracture with rig tip of the right femoral neck hardware component now projecting beyond cortical margin of the right femoral head. Femoral neck fracture appears more displaced. Mid to distal femur appear intact. IMPRESSION: 1. No acute osseous abnormality of the mid to distal femur. 2. Status post internal fixation of right proximal femur fracture, now with cortical breach of the femoral head  by fixating hardware Electronically Signed   By: Donavan Foil M.D.   On: 11/11/2018 00:03      LOS: 1 day   Time spent: More than 50% of that time was spent in counseling and/or coordination of care.  Antonieta Pert, MD [facility]  11/12/2018, 11:29 AM

## 2018-11-13 LAB — CBC
HCT: 28.3 % — ABNORMAL LOW (ref 36.0–46.0)
Hemoglobin: 8.6 g/dL — ABNORMAL LOW (ref 12.0–15.0)
MCH: 29.3 pg (ref 26.0–34.0)
MCHC: 30.4 g/dL (ref 30.0–36.0)
MCV: 96.3 fL (ref 80.0–100.0)
Platelets: 209 10*3/uL (ref 150–400)
RBC: 2.94 MIL/uL — ABNORMAL LOW (ref 3.87–5.11)
RDW: 14.1 % (ref 11.5–15.5)
WBC: 5.9 10*3/uL (ref 4.0–10.5)
nRBC: 0 % (ref 0.0–0.2)

## 2018-11-13 MED ORDER — ALBUTEROL SULFATE (2.5 MG/3ML) 0.083% IN NEBU: 2.5000 mg | INHALATION_SOLUTION | Freq: Four times a day (QID) | RESPIRATORY_TRACT | Status: DC

## 2018-11-13 MED ORDER — TRAZODONE HCL 50 MG PO TABS
25.0000 mg | ORAL_TABLET | Freq: Once | ORAL | Status: AC
  Administered 2018-11-13: 25 mg via ORAL
  Filled 2018-11-13: qty 1

## 2018-11-13 MED ORDER — ALBUTEROL SULFATE (2.5 MG/3ML) 0.083% IN NEBU
2.5000 mg | INHALATION_SOLUTION | Freq: Four times a day (QID) | RESPIRATORY_TRACT | Status: DC
  Administered 2018-11-14 (×2): 2.5 mg via RESPIRATORY_TRACT
  Filled 2018-11-13 (×2): qty 3

## 2018-11-13 NOTE — Progress Notes (Signed)
PROGRESS NOTE    Brandy [name]  RKY:[id number] DOB: Oct 08, 1932 DOA: 11/10/2018 PCP: Kathyrn Lass, MD   Brief Narrative: As per HPI: 85 y.o. female with history of moderate aortic stenosis, hypertension, chronic anemia, breast cancer was recently admitted for right hip fracture had undergone surgery following which patient was admitted again for respiratory failure secondary to pneumonia had a fall at home on Nov 09, 2018 2 days ago.  Patient states she tripped and fell and also at the same time hit her head.  Did not lose consciousness.  Did not have any chest pain shortness of breath nausea vomiting or diarrhea.  Since the fall patient has been having persistent pain in the right hip and decided to come to the ER last evening.  ED Course: In the ER work-up shows right hip fracture again at the site of recent surgery and on-call orthopedic surgeon was consulted.  Lab work show creatinine of 0.8 sodium 141 potassium 4 hemoglobin 8.6 platelets 330 EKG was normal sinus rhythm a chest x-ray shows persistent opacity which will need further work-up as outpatient with CT scan.  CT head was unremarkable CT of the right hip and pelvis was showing fracture of the right hip around the femoral neck.  Pain is controlled. On bed rest. Awaiting for intervention in the morning.  Subjective: Seen and examined.  Resting comfortably.  No new complaints.  Assessment & Plan:   Right Hip fracture closed w Right IM nail failure: Appreciate orthopedic input, noted plan for surgery on Monday, continue on pain control, strict bedrest.  Refer to HPI for risk assessment. Pt has no complaints.  Essential hypertension: BP  Is well controlled, meds on hold.  Moderate aortic stenosis : Asymptomatic with no shortness of breath or chest pain.  Chronic anemis from B12 deficiency and iron deficiency on supplements.  Hemoglobin stable 8-10 gm range.  HLD- on statins  Recently treated for pneumonia chest x-ray showed  persistent opacity and need outpatient CAT scan.  COVID 19 Negative  DVT prophylaxis: SCD Code Status: DNR Family Communication: discussed w patient.  Disposition Plan: remains inpatient pending clinical improvement.  Consultants:  Orthopedics  Procedures:  Antimicrobials: Anti-infectives (From admission, onward)   None       Objective: Vitals:   11/13/18 0625 11/13/18 0753 11/13/18 1029 11/13/18 1349  BP: 136/61  (!) 128/53 (!) 153/55  Pulse: 77  83 84  Resp: 18  16 20   Temp: 98.6 F (37 C)  99.3 F (37.4 C) 100.2 F (37.9 C)  TempSrc: Oral  Oral Oral  SpO2: 95% 97% 94% 92%  Weight:      Height:        Intake/Output Summary (Last 24 hours) at 11/13/2018 1401 Last data filed at 11/13/2018 1210 Gross per 24 hour  Intake 300 ml  Output 750 ml  Net -450 ml   Filed Weights   11/11/18 0512 11/11/18 2021  Weight: 64.7 kg 63.5 kg   Weight change:   Body mass index is 28.26 kg/m.  Intake/Output from previous day: 05/09 0701 - 05/10 0700 In: 240 [P.O.:240] Out: 300 [Urine:300] Intake/Output this shift: Total I/O In: 60 [P.O.:60] Out: 450 [Urine:450]  Examination:  General exam: Appears calm, comfortable, not in acute distress.  Pain is controlled.   HEENT:PERRLA, Oral mucosa moist, Ear/Nose normal on gross exam Respiratory system: Bilateral equal air entry, no wheezing or crackles  Cardiovascular system: S1 & S2 heard,No JVD, murmurs. Gastrointestinal system: Abdomen is  soft, non tender,  non distended, BS +  Nervous System:Alert and oriented. No focal neurological deficits/moving extremities, sensation intact. Extremities: RT HIP TENDER TO TOUCH. Skin: No rashes, lesions, no icterus MSK: Normal muscle bulk,tone ,power  Medications:  Scheduled Meds: . acetaminophen  650 mg Oral Q6H  . albuterol  2.5 mg Inhalation Q6H  . atorvastatin  40 mg Oral Daily  . citalopram  20 mg Oral Daily  . ferrous sulfate  325 mg Oral Daily  . gabapentin  300 mg Oral  QHS  . lactose free nutrition  237 mL Oral BID BM  . multivitamin with minerals  1 tablet Oral Daily  . pantoprazole  40 mg Oral Daily  . pneumococcal 23 valent vaccine  0.5 mL Intramuscular Tomorrow-1000  . polyethylene glycol  17 g Oral Daily   Continuous Infusions: . methocarbamol (ROBAXIN) IV      Data Reviewed: I have personally reviewed following labs and imaging studies  CBC: Recent Labs  Lab 11/11/18 0150 11/12/18 0753 11/13/18 0535  WBC 9.9 5.3 5.9  NEUTROABS 7.6  --   --   HGB 8.6* 10.6* 8.6*  HCT 28.5* 34.3* 28.3*  MCV 98.3 95.8 96.3  PLT 330 236 017   Basic Metabolic Panel: Recent Labs  Lab 11/11/18 0150  NA 141  K 4.0  CL 108  CO2 22  GLUCOSE 121*  BUN 20  CREATININE 0.86  CALCIUM 9.2   GFR: Estimated Creatinine Clearance: 40.1 mL/min (by C-G formula based on SCr of 0.86 mg/dL). Liver Function Tests: No results for input(s): AST, ALT, ALKPHOS, BILITOT, PROT, ALBUMIN in the last 168 hours. No results for input(s): LIPASE, AMYLASE in the last 168 hours. No results for input(s): AMMONIA in the last 168 hours. Coagulation Profile: Recent Labs  Lab 11/11/18 0303  INR 1.2   Cardiac Enzymes: No results for input(s): CKTOTAL, CKMB, CKMBINDEX, TROPONINI in the last 168 hours. BNP (last 3 results) No results for input(s): PROBNP in the last 8760 hours. HbA1C: No results for input(s): HGBA1C in the last 72 hours. CBG: No results for input(s): GLUCAP in the last 168 hours. Lipid Profile: No results for input(s): CHOL, HDL, LDLCALC, TRIG, CHOLHDL, LDLDIRECT in the last 72 hours. Thyroid Function Tests: No results for input(s): TSH, T4TOTAL, FREET4, T3FREE, THYROIDAB in the last 72 hours. Anemia Panel: No results for input(s): VITAMINB12, FOLATE, FERRITIN, TIBC, IRON, RETICCTPCT in the last 72 hours. Sepsis Labs: No results for input(s): PROCALCITON, LATICACIDVEN in the last 168 hours.  Recent Results (from the past 240 hour(s))  SARS Coronavirus 2  (CEPHEID - Performed in [place] hospital lab), Hosp Order     Status: None   Collection Time: 11/11/18  1:10 AM  Result Value Ref Range Status   SARS Coronavirus 2 NEGATIVE NEGATIVE Final    Comment: (NOTE) If result is NEGATIVE SARS-CoV-2 target nucleic acids are NOT DETECTED. The SARS-CoV-2 RNA is generally detectable in upper and lower  respiratory specimens during the acute phase of infection. The lowest  concentration of SARS-CoV-2 viral copies this assay can detect is 250  copies / mL. A negative result does not preclude SARS-CoV-2 infection  and should not be used as the sole basis for treatment or other  patient management decisions.  A negative result may occur with  improper specimen collection / handling, submission of specimen other  than nasopharyngeal swab, presence of viral mutation(s) within the  areas targeted by this assay, and inadequate number of viral copies  (<250 copies / mL). A  negative result must be combined with clinical  observations, patient history, and epidemiological information. If result is POSITIVE SARS-CoV-2 target nucleic acids are DETECTED. The SARS-CoV-2 RNA is generally detectable in upper and lower  respiratory specimens dur ing the acute phase of infection.  Positive  results are indicative of active infection with SARS-CoV-2.  Clinical  correlation with patient history and other diagnostic information is  necessary to determine patient infection status.  Positive results do  not rule out bacterial infection or co-infection with other viruses. If result is PRESUMPTIVE POSTIVE SARS-CoV-2 nucleic acids MAY BE PRESENT.   A presumptive positive result was obtained on the submitted specimen  and confirmed on repeat testing.  While 2019 novel coronavirus  (SARS-CoV-2) nucleic acids may be present in the submitted sample  additional confirmatory testing may be necessary for epidemiological  and / or clinical management purposes  to differentiate  between  SARS-CoV-2 and other Sarbecovirus currently known to infect humans.  If clinically indicated additional testing with an alternate test  methodology [id number]) is advised. The SARS-CoV-2 RNA is generally  detectable in upper and lower respiratory sp ecimens during the acute  phase of infection. The expected result is Negative. Fact Sheet for Patients:  StrictlyIdeas.no Fact Sheet for Healthcare Providers: BankingDealers.co.za This test is not yet approved or cleared by the [place] FDA and has been authorized for detection and/or diagnosis of SARS-CoV-2 by FDA under an Emergency Use Authorization (EUA).  This EUA will remain in effect (meaning this test can be used) for the duration of the COVID-19 declaration under Section 564(b)(1) of the Act, 21 U.S.C. section 360bbb-3(b)(1), unless the authorization is terminated or revoked sooner. Performed at [facility], [place] [address], [place], [place] [address]       Radiology Studies: No results found.    LOS: 2 days   Time spent: More than 50% of that time was spent in counseling and/or coordination of care.  Antonieta Pert, MD [facility]  11/13/2018, 2:01 PM

## 2018-11-13 NOTE — Progress Notes (Signed)
Plan for surgery with Dr. Wynelle Link tomorrow am, NPO placed for MN and chemical DVT ppx held.  Nicholes Stairs

## 2018-11-13 NOTE — Progress Notes (Signed)
The orders for consent has the surgeon named as Dr. Lyla Glassing and OR schedule has Dr. Wynelle Link will clarify surgeon before patient to sign consent.

## 2018-11-14 ENCOUNTER — Inpatient Hospital Stay (HOSPITAL_COMMUNITY): Payer: Medicare HMO | Admitting: Physician Assistant

## 2018-11-14 ENCOUNTER — Inpatient Hospital Stay (HOSPITAL_COMMUNITY): Payer: Medicare HMO

## 2018-11-14 ENCOUNTER — Encounter (HOSPITAL_COMMUNITY): Payer: Self-pay | Admitting: Emergency Medicine

## 2018-11-14 ENCOUNTER — Encounter (HOSPITAL_COMMUNITY)
Admission: EM | Disposition: A | Discharge status: Home Health | Payer: Self-pay | Source: Home / Self Care | Attending: Internal Medicine

## 2018-11-14 DIAGNOSIS — E538 Deficiency of other specified B group vitamins: Secondary | ICD-10-CM

## 2018-11-14 HISTORY — PX: TOTAL HIP ARTHROPLASTY: SHX124

## 2018-11-14 LAB — CBC
HCT: 28.1 % — ABNORMAL LOW (ref 36.0–46.0)
Hemoglobin: 8.5 g/dL — ABNORMAL LOW (ref 12.0–15.0)
MCH: 30.4 pg (ref 26.0–34.0)
MCHC: 30.2 g/dL (ref 30.0–36.0)
MCV: 100.4 fL — ABNORMAL HIGH (ref 80.0–100.0)
Platelets: 347 10*3/uL (ref 150–400)
RBC: 2.8 MIL/uL — ABNORMAL LOW (ref 3.87–5.11)
RDW: 14.2 % (ref 11.5–15.5)
WBC: 6.9 10*3/uL (ref 4.0–10.5)
nRBC: 0 % (ref 0.0–0.2)

## 2018-11-14 LAB — PREPARE RBC (CROSSMATCH)

## 2018-11-14 SURGERY — ARTHROPLASTY, HIP, TOTAL,POSTERIOR APPROACH
Anesthesia: General | Site: Hip | Laterality: Right

## 2018-11-14 MED ORDER — PHENOL 1.4 % MT LIQD: 1.0000 | OROMUCOSAL | Status: DC | PRN

## 2018-11-14 MED ORDER — FENTANYL CITRATE (PF) 100 MCG/2ML IJ SOLN
INTRAMUSCULAR | Status: DC | PRN
  Administered 2018-11-14 (×3): 50 ug via INTRAVENOUS
  Administered 2018-11-14: 25 ug via INTRAVENOUS
  Administered 2018-11-14: 50 ug via INTRAVENOUS
  Administered 2018-11-14: 25 ug via INTRAVENOUS

## 2018-11-14 MED ORDER — FENTANYL CITRATE (PF) 100 MCG/2ML IJ SOLN
INTRAMUSCULAR | Status: AC
  Administered 2018-11-14: 25 ug via INTRAVENOUS
  Filled 2018-11-14: qty 2

## 2018-11-14 MED ORDER — DOCUSATE SODIUM 100 MG PO CAPS
100.0000 mg | ORAL_CAPSULE | Freq: Two times a day (BID) | ORAL | Status: DC
  Administered 2018-11-14 – 2018-11-17 (×6): 100 mg via ORAL
  Filled 2018-11-14 (×6): qty 1

## 2018-11-14 MED ORDER — ONDANSETRON HCL 4 MG/2ML IJ SOLN
INTRAMUSCULAR | Status: DC | PRN
  Administered 2018-11-14: 4 mg via INTRAVENOUS

## 2018-11-14 MED ORDER — TRANEXAMIC ACID-NACL 1000-0.7 MG/100ML-% IV SOLN
1000.0000 mg | INTRAVENOUS | Status: AC
  Administered 2018-11-14: 1000 mg via INTRAVENOUS
  Filled 2018-11-14: qty 100

## 2018-11-14 MED ORDER — PHENYLEPHRINE HCL (PRESSORS) 10 MG/ML IV SOLN
INTRAVENOUS | Status: DC | PRN
  Administered 2018-11-14: 80 ug via INTRAVENOUS

## 2018-11-14 MED ORDER — LACTATED RINGERS IV SOLN
INTRAVENOUS | Status: DC
  Administered 2018-11-14: 11:00:00 via INTRAVENOUS

## 2018-11-14 MED ORDER — METOCLOPRAMIDE HCL 5 MG/ML IJ SOLN: 5.0000 mg | Freq: Three times a day (TID) | INTRAMUSCULAR | Status: DC | PRN

## 2018-11-14 MED ORDER — BUPIVACAINE-EPINEPHRINE (PF) 0.25% -1:200000 IJ SOLN
INTRAMUSCULAR | Status: AC
  Filled 2018-11-14: qty 30

## 2018-11-14 MED ORDER — ALBUTEROL SULFATE (2.5 MG/3ML) 0.083% IN NEBU
2.5000 mg | INHALATION_SOLUTION | Freq: Four times a day (QID) | RESPIRATORY_TRACT | Status: DC
  Administered 2018-11-15 – 2018-11-17 (×7): 2.5 mg via RESPIRATORY_TRACT
  Filled 2018-11-14 (×7): qty 3

## 2018-11-14 MED ORDER — SUGAMMADEX SODIUM 200 MG/2ML IV SOLN
INTRAVENOUS | Status: AC
  Filled 2018-11-14: qty 2

## 2018-11-14 MED ORDER — BUPIVACAINE-EPINEPHRINE (PF) 0.25% -1:200000 IJ SOLN
INTRAMUSCULAR | Status: DC | PRN
  Administered 2018-11-14: 30 mL

## 2018-11-14 MED ORDER — DIPHENHYDRAMINE HCL 12.5 MG/5ML PO ELIX: 12.5000 mg | ORAL_SOLUTION | ORAL | Status: DC | PRN

## 2018-11-14 MED ORDER — ETOMIDATE 2 MG/ML IV SOLN
INTRAVENOUS | Status: AC
  Filled 2018-11-14: qty 10

## 2018-11-14 MED ORDER — TRANEXAMIC ACID-NACL 1000-0.7 MG/100ML-% IV SOLN
1000.0000 mg | Freq: Once | INTRAVENOUS | Status: AC
  Administered 2018-11-14: 1000 mg via INTRAVENOUS
  Filled 2018-11-14: qty 100

## 2018-11-14 MED ORDER — DIPHENHYDRAMINE HCL 50 MG/ML IJ SOLN
INTRAMUSCULAR | Status: DC | PRN
  Administered 2018-11-14: 12.5 mg via INTRAVENOUS

## 2018-11-14 MED ORDER — LIDOCAINE HCL (CARDIAC) PF 100 MG/5ML IV SOSY
PREFILLED_SYRINGE | INTRAVENOUS | Status: DC | PRN
  Administered 2018-11-14: 60 mg via INTRAVENOUS

## 2018-11-14 MED ORDER — MENTHOL 3 MG MT LOZG: 1.0000 | LOZENGE | OROMUCOSAL | Status: DC | PRN

## 2018-11-14 MED ORDER — ENOXAPARIN SODIUM 40 MG/0.4ML ~~LOC~~ SOLN
40.0000 mg | SUBCUTANEOUS | Status: DC
  Administered 2018-11-15 – 2018-11-17 (×3): 40 mg via SUBCUTANEOUS
  Filled 2018-11-14 (×3): qty 0.4

## 2018-11-14 MED ORDER — FENTANYL CITRATE (PF) 100 MCG/2ML IJ SOLN
INTRAMUSCULAR | Status: AC
  Administered 2018-11-14: 50 ug via INTRAVENOUS
  Filled 2018-11-14: qty 2

## 2018-11-14 MED ORDER — SODIUM CHLORIDE 0.9 % IV SOLN
INTRAVENOUS | Status: DC
  Administered 2018-11-14 – 2018-11-17 (×5): via INTRAVENOUS

## 2018-11-14 MED ORDER — ONDANSETRON HCL 4 MG/2ML IJ SOLN
4.0000 mg | Freq: Four times a day (QID) | INTRAMUSCULAR | Status: DC | PRN
  Filled 2018-11-14: qty 2

## 2018-11-14 MED ORDER — CEFAZOLIN SODIUM-DEXTROSE 1-4 GM/50ML-% IV SOLN
1.0000 g | Freq: Four times a day (QID) | INTRAVENOUS | Status: AC
  Administered 2018-11-14 – 2018-11-15 (×2): 1 g via INTRAVENOUS
  Filled 2018-11-14 (×2): qty 50

## 2018-11-14 MED ORDER — SUGAMMADEX SODIUM 200 MG/2ML IV SOLN
INTRAVENOUS | Status: DC | PRN
  Administered 2018-11-14: 125 mg via INTRAVENOUS

## 2018-11-14 MED ORDER — SODIUM CHLORIDE 0.9% IV SOLUTION
Freq: Once | INTRAVENOUS | Status: AC
  Administered 2018-11-14: 10:00:00 via INTRAVENOUS

## 2018-11-14 MED ORDER — DIPHENHYDRAMINE HCL 50 MG/ML IJ SOLN
INTRAMUSCULAR | Status: AC
  Filled 2018-11-14: qty 1

## 2018-11-14 MED ORDER — METOCLOPRAMIDE HCL 5 MG PO TABS: 5.0000 mg | ORAL_TABLET | Freq: Three times a day (TID) | ORAL | Status: DC | PRN

## 2018-11-14 MED ORDER — CEFAZOLIN SODIUM-DEXTROSE 1-4 GM/50ML-% IV SOLN
1.0000 g | Freq: Once | INTRAVENOUS | Status: AC
  Administered 2018-11-14: 1 g via INTRAVENOUS
  Filled 2018-11-14: qty 50

## 2018-11-14 MED ORDER — SODIUM CHLORIDE 0.9 % IV SOLN: INTRAVENOUS | Status: DC

## 2018-11-14 MED ORDER — FLEET ENEMA 7-19 GM/118ML RE ENEM: 1.0000 | ENEMA | Freq: Once | RECTAL | Status: DC | PRN

## 2018-11-14 MED ORDER — PROPOFOL 10 MG/ML IV BOLUS
INTRAVENOUS | Status: AC
  Filled 2018-11-14: qty 20

## 2018-11-14 MED ORDER — BISACODYL 10 MG RE SUPP: 10.0000 mg | Freq: Every day | RECTAL | Status: DC | PRN

## 2018-11-14 MED ORDER — DEXAMETHASONE SODIUM PHOSPHATE 10 MG/ML IJ SOLN
INTRAMUSCULAR | Status: DC | PRN
  Administered 2018-11-14: 8 mg via INTRAVENOUS

## 2018-11-14 MED ORDER — ONDANSETRON HCL 4 MG/2ML IJ SOLN: 4.0000 mg | Freq: Once | INTRAMUSCULAR | Status: DC | PRN

## 2018-11-14 MED ORDER — SODIUM CHLORIDE 0.9 % IV SOLN
INTRAVENOUS | Status: DC | PRN
  Administered 2018-11-14: 12:00:00 via INTRAVENOUS

## 2018-11-14 MED ORDER — SODIUM CHLORIDE 0.9 % IR SOLN
Status: DC | PRN
  Administered 2018-11-14: 1000 mL

## 2018-11-14 MED ORDER — FENTANYL CITRATE (PF) 100 MCG/2ML IJ SOLN
25.0000 ug | INTRAMUSCULAR | Status: DC | PRN
  Administered 2018-11-14: 50 ug via INTRAVENOUS
  Administered 2018-11-14 (×2): 25 ug via INTRAVENOUS
  Administered 2018-11-14: 50 ug via INTRAVENOUS

## 2018-11-14 MED ORDER — SUCCINYLCHOLINE CHLORIDE 20 MG/ML IJ SOLN
INTRAMUSCULAR | Status: DC | PRN
  Administered 2018-11-14: 80 mg via INTRAVENOUS

## 2018-11-14 MED ORDER — POLYETHYLENE GLYCOL 3350 17 G PO PACK: 17.0000 g | PACK | Freq: Every day | ORAL | Status: DC | PRN

## 2018-11-14 MED ORDER — ETOMIDATE 2 MG/ML IV SOLN
INTRAVENOUS | Status: DC | PRN
  Administered 2018-11-14: 14 mg via INTRAVENOUS

## 2018-11-14 MED ORDER — DEXAMETHASONE SODIUM PHOSPHATE 10 MG/ML IJ SOLN
10.0000 mg | Freq: Once | INTRAMUSCULAR | Status: AC
  Administered 2018-11-15: 10 mg via INTRAVENOUS
  Filled 2018-11-14: qty 1

## 2018-11-14 MED ORDER — ONDANSETRON HCL 4 MG/2ML IJ SOLN
INTRAMUSCULAR | Status: AC
  Filled 2018-11-14: qty 2

## 2018-11-14 MED ORDER — FENTANYL CITRATE (PF) 250 MCG/5ML IJ SOLN
INTRAMUSCULAR | Status: AC
  Filled 2018-11-14: qty 5

## 2018-11-14 MED ORDER — ONDANSETRON HCL 4 MG PO TABS
4.0000 mg | ORAL_TABLET | Freq: Four times a day (QID) | ORAL | Status: DC | PRN
  Filled 2018-11-14: qty 1

## 2018-11-14 MED ORDER — ROCURONIUM BROMIDE 100 MG/10ML IV SOLN
INTRAVENOUS | Status: DC | PRN
  Administered 2018-11-14: 30 mg via INTRAVENOUS
  Administered 2018-11-14: 10 mg via INTRAVENOUS

## 2018-11-14 SURGICAL SUPPLY — 58 items
BIT DRILL 2.8X128 (BIT) ×2 IMPLANT
BIT DRILL 2.8X128MM (BIT) ×1
BLADE EXTENDED COATED 6.5IN (ELECTRODE) ×3 IMPLANT
BLADE SAW SAG 73X25 THK (BLADE) ×1
BLADE SAW SGTL 73X25 THK (BLADE) ×2 IMPLANT
BLADE SURG SZ10 CARB STEEL (BLADE) ×6 IMPLANT
CHLORAPREP W/TINT 26 (MISCELLANEOUS) ×3 IMPLANT
CLOSURE WOUND 1/2 X4 (GAUZE/BANDAGES/DRESSINGS) ×1
COVER SURGICAL LIGHT HANDLE (MISCELLANEOUS) ×3 IMPLANT
COVER WAND RF STERILE (DRAPES) IMPLANT
CUP ACET PINNACLE SECTR 50MM (Hips) ×1 IMPLANT
DECANTER SPIKE VIAL GLASS SM (MISCELLANEOUS) ×3 IMPLANT
DRAPE INCISE IOBAN 66X45 STRL (DRAPES) ×3 IMPLANT
DRAPE ORTHO SPLIT 77X108 STRL (DRAPES) ×4
DRAPE POUCH INSTRU U-SHP 10X18 (DRAPES) ×3 IMPLANT
DRAPE SURG ORHT 6 SPLT 77X108 (DRAPES) ×2 IMPLANT
DRAPE U-SHAPE 47X51 STRL (DRAPES) ×3 IMPLANT
DRSG ADAPTIC 3X8 NADH LF (GAUZE/BANDAGES/DRESSINGS) ×3 IMPLANT
DRSG MEPILEX BORDER 4X12 (GAUZE/BANDAGES/DRESSINGS) ×3 IMPLANT
DRSG MEPILEX BORDER 4X4 (GAUZE/BANDAGES/DRESSINGS) ×3 IMPLANT
ELECT REM PT RETURN 15FT ADLT (MISCELLANEOUS) ×3 IMPLANT
EVACUATOR 1/8 PVC DRAIN (DRAIN) ×3 IMPLANT
FACESHIELD WRAPAROUND (MASK) ×12 IMPLANT
GAUZE SPONGE 4X4 12PLY STRL (GAUZE/BANDAGES/DRESSINGS) ×3 IMPLANT
GLOVE BIO SURGEON STRL SZ7 (GLOVE) ×3 IMPLANT
GLOVE BIO SURGEON STRL SZ8 (GLOVE) ×3 IMPLANT
GLOVE BIOGEL PI IND STRL 7.0 (GLOVE) ×2 IMPLANT
GLOVE BIOGEL PI IND STRL 8 (GLOVE) ×1 IMPLANT
GLOVE BIOGEL PI INDICATOR 7.0 (GLOVE) ×4
GLOVE BIOGEL PI INDICATOR 8 (GLOVE) ×2
GOWN STRL REUS W/TWL LRG LVL3 (GOWN DISPOSABLE) ×6 IMPLANT
HEAD FEM STD 32X+9 STRL (Hips) ×3 IMPLANT
IMMOBILIZER KNEE 20 (SOFTGOODS) ×3
IMMOBILIZER KNEE 20 THIGH 36 (SOFTGOODS) ×1 IMPLANT
KIT BASIN OR (CUSTOM PROCEDURE TRAY) ×3 IMPLANT
KIT TURNOVER KIT A (KITS) IMPLANT
LINER MARATHON 32 50 (Hips) ×3 IMPLANT
MANIFOLD NEPTUNE II (INSTRUMENTS) ×3 IMPLANT
NDL SAFETY ECLIPSE 18X1.5 (NEEDLE) ×1 IMPLANT
NEEDLE HYPO 18GX1.5 SHARP (NEEDLE) ×2
PACK TOTAL JOINT (CUSTOM PROCEDURE TRAY) ×3 IMPLANT
PASSER SUT SWANSON 36MM LOOP (INSTRUMENTS) ×3 IMPLANT
PINNACLE SECTOR CUP 50MM (Hips) ×3 IMPLANT
PROTECTOR NERVE ULNAR (MISCELLANEOUS) ×3 IMPLANT
STEM FEM CMNTLSS SM AML 13.5 (Hips) ×3 IMPLANT
STRIP CLOSURE SKIN 1/2X4 (GAUZE/BANDAGES/DRESSINGS) ×2 IMPLANT
SUT ETHIBOND NAB CT1 #1 30IN (SUTURE) ×6 IMPLANT
SUT MNCRL AB 4-0 PS2 18 (SUTURE) ×3 IMPLANT
SUT STRATAFIX 0 PDS 27 VIOLET (SUTURE) ×6
SUT VIC AB 2-0 CT1 27 (SUTURE) ×6
SUT VIC AB 2-0 CT1 TAPERPNT 27 (SUTURE) ×3 IMPLANT
SUTURE STRATFX 0 PDS 27 VIOLET (SUTURE) ×2 IMPLANT
SYR 20CC LL (SYRINGE) ×3 IMPLANT
TOWEL OR 17X26 10 PK STRL BLUE (TOWEL DISPOSABLE) ×6 IMPLANT
TOWEL OR NON WOVEN STRL DISP B (DISPOSABLE) ×3 IMPLANT
TRAY FOLEY MTR SLVR 14FR STAT (SET/KITS/TRAYS/PACK) ×3 IMPLANT
WATER STERILE IRR 1000ML POUR (IV SOLUTION) ×6 IMPLANT
YANKAUER SUCT BULB TIP 10FT TU (MISCELLANEOUS) ×3 IMPLANT

## 2018-11-14 NOTE — Op Note (Signed)
NAME: Brandy [name], Brandy [name] MEDICAL RECORD [id number] ACCOUNT [id number] DATE OF BIRTH:[birth year] FACILITY: [facility] LOCATION: [facility] PHYSICIAN:Ellyson Rarick Zella Ball, MD  OPERATIVE REPORT  DATE OF PROCEDURE:  11/14/2018  PREOPERATIVE DIAGNOSIS:  Failure of fixation of right femur fracture.  POSTOPERATIVE DIAGNOSIS:  Failure of fixation of right femur fracture.  PROCEDURE:  Conversion of previous hip surgery to right total hip arthroplasty.  SURGEON:  Gaynelle Arabian, MD  ASSISTANT:  Theresa Duty, PA-C  ANESTHESIA:  General.  ESTIMATED BLOOD LOSS:  400 mL.  DRAINS:  Hemovac x1.  COMPLICATIONS:  None.  CONDITION:  Stable to recovery.  BRIEF CLINICAL NOTE:  The patient is an 85 year old female who had intramedullary nailing of a right intertrochanteric femur fracture approximately 3 weeks ago.  Last week, she fell at home with increased pain.  She went to the emergency room 3 days ago  and was noted to have a failure of fixation of the fracture and that the lag screw migrated through her femoral head into the acetabulum.  She has had severe pain with this.  She presents now for removal of the IM nail and placement of a total hip  arthroplasty.  PROCEDURE IN DETAIL:  After successful administration of general anesthetic, the patient was placed in the left lateral decubitus position with the right side up and held with a hip positioner.  Right lower extremity was isolated from her perineum with  plastic drapes and prepped and draped in the usual sterile fashion.  A posterolateral incision was made with a 10 blade through subcutaneous tissue through the level of the fascia lata, which was incised in line with the skin incision.  Sciatic nerve was  palpated and protected.  We then proceeded to remove the IM nail, which was an Affixus short intramedullary nail.  I first identified the nail proximally, and we loosened the locking screw.  I then identified the lag screw along the lateral cortex of   the femur.  We placed the extraction device onto the lag screw, and that was easily removed.  The extraction device for the IM nail was then threaded into the proximal portion of the nail.  Through the incision for the distal interlock, we were able to  pass the screwdriver and find a distal interlock and subsequently remove it.  The nail was then extracted from the femur.  The anatomy was distorted proximally due to the displaced fracture.  I was able to palpate the sciatic nerve and then find the hip capsule and made a capsulotomy.  I was then able to remove the femoral head and the majority of the femoral neck as this  was a low fracture.  We then identified the femoral canal, and then I subsequently retracted the femur anteriorly to gain acetabular exposure.  The labrum was removed, and there was damage to the acetabulum from the protruded hip screw.  I started  reaming at 45 mm up to 49 mm.  A 50 mm Pinnacle acetabular shell was then placed into the acetabulum in about 20 degrees anteversion, 40 degrees of abduction.  This was matching her native anatomy.  I had excellent purchase, and I did not need to place  any additional dome screws.  The 32 mm neutral +4 marathon liner was placed.  We then addressed the femur.  The femoral canal was first identified and a guide rod passed to make sure we were in the canal.  We then removed the guide rod and started reaming with the straight reamers  up to 13.5 mm.  Broaching started at 10.5, all the  way up to 13.5, and 13.5 had fantastic fit.  We reamed this in about 20 degrees of anteversion, which matched her native version.  A trial neck and a 32+1 head were placed.  It reduced easily and had excellent stability, but there was some soft tissue  laxity.  This was removed, and then the 32+9 head was placed, which had fantastic stability with full extension, full external rotation, 70 degrees of flexion, 40 degrees adduction, 90 degrees internal rotation, 90  degrees of flexion and 70 degrees of  internal rotation.  I placed the right leg on top of the left.  The leg lengths were then found to be equal with that +9 head in place.  The hip was then dislocated and the trials were removed.  The permanent AML 13.5 small stature stem was then impacted  into the femur in about 20 degrees of anteversion with excellent purchase.  The 32+9 metal head was placed and the hips reduced with the same stability parameters.  The wound was then copiously irrigated with saline solution, and the capsule and short  rotators were reattached to the femur through drill holes with Ethibond suture.  The fascia lata was closed over a Hemovac drain with a running StrataFix suture.  About 30 mL of 0.25% Marcaine was then injected into the subcu tissues.  Deep subcu was  closed with another Stratafix.  Superficial subcu was closed with interrupted 2-0 Vicryl and subcuticular running 4-0 Monocryl.  Incision was cleaned and dried, and Steri-Strips and a bulky sterile dressing were applied.  She was then placed into a knee  immobilizer, awakened and transported to recovery in stable condition.  Note that a surgical assistant was a medical necessity for this procedure to perform it in a safe and expeditious manner.  Surgical assistant was necessary for retraction of vital neurovascular structures and for proper positioning of the limb for safe  removal of the IM nail and then for safe and accurate placement of the total hip replacement.  LN/NUANCE  D:11/14/2018 T:11/14/2018 JOB:[id number]

## 2018-11-14 NOTE — Transfer of Care (Signed)
Immediate Anesthesia Transfer of Care Note  Patient: Brandy [name]  Procedure(s) Performed: REMOVAL OF INTRAMEDULLARY NAIL AND POSTERIOR TOTAL HIP ARTHROPLASTY (Right Hip)  Patient Location: PACU  Anesthesia Type:General  Level of Consciousness: awake, alert  and oriented  Airway & Oxygen Therapy: Patient Spontanous Breathing and Patient connected to face mask oxygen  Post-op Assessment: Report given to RN and Post -op Vital signs reviewed and stable  Post vital signs: Reviewed and stable  Last Vitals:  Vitals Value Taken Time  BP 157/76 11/14/2018  1:20 PM  Temp    Pulse 111 11/14/2018  1:25 PM  Resp 25 11/14/2018  1:25 PM  SpO2 99 % 11/14/2018  1:25 PM  Vitals shown include unvalidated device data.  Last Pain:  Vitals:   11/14/18 1036  TempSrc: Oral  PainSc:       Patients Stated Pain Goal: 4 (17/12/78 7183)  Complications: No apparent anesthesia complications

## 2018-11-14 NOTE — Progress Notes (Signed)
Subjective: Patient known to me from previous IM nailing of right femoral fracture. Had a fall last week with increased pain. Presented to ED Friday and x-ray shows that hardware has protruded through her femoral head. To surgery today for conversion to Total Hip Arhroplasty   Objective: Vital signs in last 24 hours: Temp:  [98.5 F (36.9 C)-100.2 F (37.9 C)] 98.5 F (36.9 C) (05/11 0601) Pulse Rate:  [75-87] 87 (05/11 0601) Resp:  [16-20] 18 (05/11 0601) BP: (128-161)/(50-63) 161/63 (05/11 0601) SpO2:  [92 %-97 %] 97 % (05/11 0601)  Intake/Output from previous day: 05/10 0701 - 05/11 0700 In: 60 [P.O.:60] Out: 500 [Urine:500] Intake/Output this shift: No intake/output data recorded.  Recent Labs    11/12/18 0753 11/13/18 0535 11/14/18 0412  HGB 10.6* 8.6* 8.5*   Recent Labs    11/13/18 0535 11/14/18 0412  WBC 5.9 6.9  RBC 2.94* 2.80*  HCT 28.3* 28.1*  PLT 209 347   No results for input(s): NA, K, CL, CO2, BUN, CREATININE, GLUCOSE, CALCIUM in the last 72 hours. No results for input(s): LABPT, INR in the last 72 hours.  Neurovascular intact Sensation intact distally Intact pulses distally No cellulitis present    Assessment/Plan: Failure of fixation right intertrochanteric femur fracture- Plan surgery today for conversion to Total Hip Arthroplasty. Hemoglobin 8.5 so she will probably need transfusion in or after surgery. Discussed surgery with patient and with her daughter (via phone) and they elect to proceed     Gaynelle Arabian 11/14/2018, 7:27 AM

## 2018-11-14 NOTE — Anesthesia Procedure Notes (Signed)
Procedure Name: Intubation Date/Time: 11/14/2018 11:14 AM Performed by: Glory Buff, CRNA Pre-anesthesia Checklist: Patient identified, Emergency Drugs available, Suction available and Patient being monitored Patient Re-evaluated:Patient Re-evaluated prior to induction Oxygen Delivery Method: Circle system utilized Preoxygenation: Pre-oxygenation with 100% oxygen Induction Type: IV induction Ventilation: Mask ventilation without difficulty Laryngoscope Size: Miller and 3 Grade View: Grade I Tube type: Oral Tube size: 7.0 mm Number of attempts: 1 Airway Equipment and Method: Stylet and Oral airway Placement Confirmation: ETT inserted through vocal cords under direct vision,  positive ETCO2 and breath sounds checked- equal and bilateral Secured at: 20 cm Tube secured with: Tape Dental Injury: Teeth and Oropharynx as per pre-operative assessment

## 2018-11-14 NOTE — Anesthesia Preprocedure Evaluation (Addendum)
Anesthesia Evaluation  Patient identified by MRN, date of birth, ID band Patient awake    Reviewed: Allergy & Precautions, NPO status , Patient's Chart, lab work & pertinent test results  History of Anesthesia Complications (+) DIFFICULT AIRWAY and history of anesthetic complications  Airway Mallampati: I  TM Distance: >3 FB Neck ROM: Full    Dental  (+) Upper Dentures, Lower Dentures   Pulmonary asthma ,    Pulmonary exam normal breath sounds clear to auscultation       Cardiovascular hypertension, Pt. on medications +CHF  + Valvular Problems/Murmurs AS  Rhythm:Regular Rate:Normal + Systolic murmurs Study Conclusions  - Left ventricle: The cavity size was normal. Wall thickness was   normal. Systolic function was normal. The estimated ejection   fraction was in the range of 60% to 65%. Wall motion was normal;   there were no regional wall motion abnormalities. Doppler   parameters are consistent with abnormal left ventricular   relaxation (grade 1 diastolic dysfunction). Doppler parameters   are consistent with elevated mean left atrial filling pressure. - Aortic valve: Valve mobility was moderately restricted. There was   moderate stenosis. There was trivial regurgitation. Mean gradient   (S): 25 mm Hg. Peak gradient (S): 43 mm Hg. Valve area (VTI):   1.37 cm^2. Valve area (Vmax): 1.39 cm^2. Valve area (Vmean): 1.35   cm^2. - Mitral valve: Moderately to severely calcified annulus. Mildly   thickened leaflets . Mean gradient (D): 3 mm Hg. Peak gradient   (D): 9 mm Hg. Valve area by pressure half-time: 2.34 cm^2. Valve   area by continuity equation (using LVOT flow): 2.64 cm^2. - Left atrium: The atrium was mildly dilated. - Atrial septum: No defect or patent foramen ovale was identified.  Impressions:  - The degree of aortic stenosis appears unchanged compared to the   last study (gradients may have been slightly  overestimated on the   previous study due to the irregular rhythm).    Neuro/Psych PSYCHIATRIC DISORDERS Anxiety Depression negative neurological ROS  negative psych ROS   GI/Hepatic Neg liver ROS, hiatal hernia, PUD, GERD  Medicated,  Endo/Other  negative endocrine ROS  Renal/GU Renal InsufficiencyRenal disease  negative genitourinary   Musculoskeletal  (+) Arthritis , failed intramedullary nail, right    Abdominal   Peds negative pediatric ROS (+)  Hematology  (+) Blood dyscrasia, anemia ,   Anesthesia Other Findings S/p left mastectomy   Reproductive/Obstetrics negative OB ROS                             Anesthesia Physical  Anesthesia Plan  ASA: III  Anesthesia Plan: General   Post-op Pain Management:    Induction: Intravenous, Rapid sequence and Cricoid pressure planned  PONV Risk Score and Plan: 3 and Ondansetron, Dexamethasone and Diphenhydramine  Airway Management Planned: Oral ETT  Additional Equipment:   Intra-op Plan:   Post-operative Plan: Extubation in OR  Informed Consent: I have reviewed the patients History and Physical, chart, labs and discussed the procedure including the risks, benefits and alternatives for the proposed anesthesia with the patient or authorized representative who has indicated his/her understanding and acceptance.   Patient has DNR.  Discussed DNR with patient and Suspend DNR.   Dental advisory given  Plan Discussed with: CRNA and Anesthesiologist  Anesthesia Plan Comments: (Patient wishes to suspend DNR order during perioperative period.)       Anesthesia Quick Evaluation

## 2018-11-14 NOTE — Brief Op Note (Signed)
11/14/2018  1:10 PM  PATIENT:  Brandy [name]  85 y.o. female  PRE-OPERATIVE DIAGNOSIS:  failed intramedullary nail right hip  POST-OPERATIVE DIAGNOSIS:  failed intramedullary nail right hip  PROCEDURE:  Procedure(s): REMOVAL OF INTRAMEDULLARY NAIL AND POSTERIOR TOTAL HIP ARTHROPLASTY (Right)  SURGEON:  Surgeon(s) and Role:    Gaynelle Arabian, MD - Primary  PHYSICIAN ASSISTANT:   ASSISTANTS: Theresa Duty, PA-C   ANESTHESIA:   general  EBL:  400 mL   DRAINS: (Medium) Hemovact drain(s) in the right hip with  Suction Open   LOCAL MEDICATIONS USED:  MARCAINE      COUNTS:  YES  TOURNIQUET:  * No tourniquets in log *  DICTATION: .Other Dictation: Dictation Number [id number]  PLAN OF CARE: Admit to inpatient   PATIENT DISPOSITION:  PACU - hemodynamically stable.

## 2018-11-14 NOTE — Anesthesia Postprocedure Evaluation (Signed)
Anesthesia Post Note  Patient: Brandy [name]  Procedure(s) Performed: REMOVAL OF INTRAMEDULLARY NAIL AND POSTERIOR TOTAL HIP ARTHROPLASTY (Right Hip)     Patient location during evaluation: PACU Anesthesia Type: General Level of consciousness: awake and alert and awake Pain management: pain level controlled Vital Signs Assessment: post-procedure vital signs reviewed and stable Respiratory status: spontaneous breathing, nonlabored ventilation, respiratory function stable and patient connected to nasal cannula oxygen Cardiovascular status: blood pressure returned to baseline and stable Postop Assessment: no apparent nausea or vomiting Anesthetic complications: no    Last Vitals:  Vitals:   11/14/18 1515 11/14/18 1526  BP: (!) 165/82 112/78  Pulse: (!) 106 (!) 107  Resp: 17 18  Temp:  37.2 C  SpO2: 97% 98%    Last Pain:  Vitals:   11/14/18 1515  TempSrc:   PainSc: 4                  Catalina Gravel

## 2018-11-14 NOTE — Progress Notes (Signed)
PROGRESS NOTE    Brandy [name]  YSA:[id number] DOB: 01/07/1933 DOA: 11/10/2018 PCP: Kathyrn Lass, MD   Brief Narrative: As per HPI: 85 y.o. female with history of moderate aortic stenosis, hypertension, chronic anemia, breast cancer was recently admitted for right hip fracture had undergone surgery following which patient was admitted again for respiratory failure secondary to pneumonia had a fall at home on Nov 09, 2018 2 days ago.  Patient states she tripped and fell and also at the same time hit her head.  Did not lose consciousness.  Did not have any chest pain shortness of breath nausea vomiting or diarrhea.  Since the fall patient has been having persistent pain in the right hip and decided to come to the ER last evening.  ED Course: In the ER work-up shows right hip fracture again at the site of recent surgery and on-call orthopedic surgeon was consulted.  Lab work show creatinine of 0.8 sodium 141 potassium 4 hemoglobin 8.6 platelets 330 EKG was normal sinus rhythm a chest x-ray shows persistent opacity which will need further work-up as outpatient with CT scan.  CT head was unremarkable CT of the right hip and pelvis was showing fracture of the right hip around the femoral neck.  Subjective: Seen this morning, pain is controlled, no acute distress asking for time of her hip surgery.  Assessment & Plan:   Right Hip fracture closed w Right IM nail failure: Appreciate orthopedic input, she is going for operative intervention this afternoon. continue on pain control, strict bedrest.  Refer to HPI for risk assessment. Pt has no complaints. Defer DVT chemical prophylaxis to orthopedic postop  Essential hypertension: BP  Is well controlled, meds on hold.  Moderate aortic stenosis : Asymptomatic with no shortness of breath or chest pain.  Chronic anemis from B12 deficiency and iron deficiency on supplements.  Hemoglobin stable in 8 gm range, monitor post op. transfuse if <7 gm  HLD- on  statins  Recently treated for pneumonia chest x-ray showed persistent opacity and need outpatient CAT scan.  COVID 19 Negative  DVT prophylaxis: SCD Code Status: DNR Family Communication: discussed w patient re POC.  Disposition Plan: remains inpatient pending clinical improvement.  Waiting for operative intervention today.  Consultants:  Orthopedics  Procedures:  Antimicrobials: Anti-infectives (From admission, onward)   Start     Dose/Rate Route Frequency Ordered Stop   11/14/18 0930  [MAR Hold]  ceFAZolin (ANCEF) IVPB 1 g/50 mL premix     (MAR Hold since Mon 11/14/2018 at 0953. Reason: Transfer to a Procedural area.)   1 g 100 mL/hr over 30 Minutes Intravenous  Once 11/14/18 0841 11/14/18 1147       Objective: Vitals:   11/14/18 0745 11/14/18 0954 11/14/18 1019 11/14/18 1036  BP:   (!) 153/65 (!) 151/66  Pulse:   99 98  Resp:   18 16  Temp:   99.2 F (37.3 C) 99.3 F (37.4 C)  TempSrc:   Oral Oral  SpO2: 96%  94% 94%  Weight:  65.8 kg    Height:  4\' 11"  (1.499 m)      Intake/Output Summary (Last 24 hours) at 11/14/2018 1155 Last data filed at 11/14/2018 1036 Gross per 24 hour  Intake 25 ml  Output 1350 ml  Net -1325 ml   Filed Weights   11/11/18 0512 11/11/18 2021 11/14/18 0954  Weight: 64.7 kg 63.5 kg 65.8 kg   Weight change:   Body mass index is 29.29 kg/m.  Intake/Output from previous day: 05/10 0701 - 05/11 0700 In: 60 [P.O.:60] Out: 950 [Urine:950] Intake/Output this shift: Total I/O In: 25 [I.V.:25] Out: 400 [Urine:400]  Examination:  General exam: Alert awake not in acute distress, elderly and frail.   HEENT:PERRLA, Oral mucosa moist, Ear/Nose normal on gross exam Respiratory system: Bilateral equal air entry, no wheezing or crackles  Cardiovascular system: S1 & S2 heard,No JVD, murmurs. Gastrointestinal system: Abdomen is  soft, non tender, non distended, BS +  Nervous System:Alert and oriented. No focal neurological deficits/moving  extremities, sensation intact. Extremities: Right hip is tender to touch externally rotated and shortened.   Skin: No rashes, lesions, no icterus MSK: Normal muscle bulk,tone ,power  Medications:  Scheduled Meds:  [MAR Hold] acetaminophen  650 mg Oral Q6H   [MAR Hold] albuterol  2.5 mg Inhalation Q6H WA   [MAR Hold] atorvastatin  40 mg Oral Daily   [MAR Hold] citalopram  20 mg Oral Daily   [MAR Hold] ferrous sulfate  325 mg Oral Daily   [MAR Hold] gabapentin  300 mg Oral QHS   [MAR Hold] lactose free nutrition  237 mL Oral BID BM   [MAR Hold] multivitamin with minerals  1 tablet Oral Daily   [MAR Hold] pantoprazole  40 mg Oral Daily   [MAR Hold] pneumococcal 23 valent vaccine  0.5 mL Intramuscular Tomorrow-1000   [MAR Hold] polyethylene glycol  17 g Oral Daily   Continuous Infusions:  lactated ringers 50 mL/hr at 11/14/18 1048   [MAR Hold] methocarbamol (ROBAXIN) IV      Data Reviewed: I have personally reviewed following labs and imaging studies  CBC: Recent Labs  Lab 11/11/18 0150 11/12/18 0753 11/13/18 0535 11/14/18 0412  WBC 9.9 5.3 5.9 6.9  NEUTROABS 7.6  --   --   --   HGB 8.6* 10.6* 8.6* 8.5*  HCT 28.5* 34.3* 28.3* 28.1*  MCV 98.3 95.8 96.3 100.4*  PLT 330 236 209 564   Basic Metabolic Panel: Recent Labs  Lab 11/11/18 0150  NA 141  K 4.0  CL 108  CO2 22  GLUCOSE 121*  BUN 20  CREATININE 0.86  CALCIUM 9.2   GFR: Estimated Creatinine Clearance: 40.8 mL/min (by C-G formula based on SCr of 0.86 mg/dL). Liver Function Tests: No results for input(s): AST, ALT, ALKPHOS, BILITOT, PROT, ALBUMIN in the last 168 hours. No results for input(s): LIPASE, AMYLASE in the last 168 hours. No results for input(s): AMMONIA in the last 168 hours. Coagulation Profile: Recent Labs  Lab 11/11/18 0303  INR 1.2   Cardiac Enzymes: No results for input(s): CKTOTAL, CKMB, CKMBINDEX, TROPONINI in the last 168 hours. BNP (last 3 results) No results for  input(s): PROBNP in the last 8760 hours. HbA1C: No results for input(s): HGBA1C in the last 72 hours. CBG: No results for input(s): GLUCAP in the last 168 hours. Lipid Profile: No results for input(s): CHOL, HDL, LDLCALC, TRIG, CHOLHDL, LDLDIRECT in the last 72 hours. Thyroid Function Tests: No results for input(s): TSH, T4TOTAL, FREET4, T3FREE, THYROIDAB in the last 72 hours. Anemia Panel: No results for input(s): VITAMINB12, FOLATE, FERRITIN, TIBC, IRON, RETICCTPCT in the last 72 hours. Sepsis Labs: No results for input(s): PROCALCITON, LATICACIDVEN in the last 168 hours.  Recent Results (from the past 240 hour(s))  SARS Coronavirus 2 (CEPHEID - Performed in [place] hospital lab), Hosp Order     Status: None   Collection Time: 11/11/18  1:10 AM  Result Value Ref Range Status   SARS  Coronavirus 2 NEGATIVE NEGATIVE Final    Comment: (NOTE) If result is NEGATIVE SARS-CoV-2 target nucleic acids are NOT DETECTED. The SARS-CoV-2 RNA is generally detectable in upper and lower  respiratory specimens during the acute phase of infection. The lowest  concentration of SARS-CoV-2 viral copies this assay can detect is 250  copies / mL. A negative result does not preclude SARS-CoV-2 infection  and should not be used as the sole basis for treatment or other  patient management decisions.  A negative result may occur with  improper specimen collection / handling, submission of specimen other  than nasopharyngeal swab, presence of viral mutation(s) within the  areas targeted by this assay, and inadequate number of viral copies  (<250 copies / mL). A negative result must be combined with clinical  observations, patient history, and epidemiological information. If result is POSITIVE SARS-CoV-2 target nucleic acids are DETECTED. The SARS-CoV-2 RNA is generally detectable in upper and lower  respiratory specimens dur ing the acute phase of infection.  Positive  results are indicative of active  infection with SARS-CoV-2.  Clinical  correlation with patient history and other diagnostic information is  necessary to determine patient infection status.  Positive results do  not rule out bacterial infection or co-infection with other viruses. If result is PRESUMPTIVE POSTIVE SARS-CoV-2 nucleic acids MAY BE PRESENT.   A presumptive positive result was obtained on the submitted specimen  and confirmed on repeat testing.  While 2019 novel coronavirus  (SARS-CoV-2) nucleic acids may be present in the submitted sample  additional confirmatory testing may be necessary for epidemiological  and / or clinical management purposes  to differentiate between  SARS-CoV-2 and other Sarbecovirus currently known to infect humans.  If clinically indicated additional testing with an alternate test  methodology [phone]) is advised. The SARS-CoV-2 RNA is generally  detectable in upper and lower respiratory sp ecimens during the acute  phase of infection. The expected result is Negative. Fact Sheet for Patients:  StrictlyIdeas.no Fact Sheet for Healthcare Providers: BankingDealers.co.za This test is not yet approved or cleared by the [place] FDA and has been authorized for detection and/or diagnosis of SARS-CoV-2 by FDA under an Emergency Use Authorization (EUA).  This EUA will remain in effect (meaning this test can be used) for the duration of the COVID-19 declaration under Section 564(b)(1) of the Act, 21 U.S.C. section 360bbb-3(b)(1), unless the authorization is terminated or revoked sooner. Performed at [facility], [place] [address], [place],  [address]       Radiology Studies: No results found.    LOS: 3 days   Time spent: More than 50% of that time was spent in counseling and/or coordination of care.  Antonieta Pert, MD [facility]  11/14/2018, 11:55 AM

## 2018-11-15 ENCOUNTER — Encounter (HOSPITAL_COMMUNITY): Payer: Self-pay | Admitting: Orthopedic Surgery

## 2018-11-15 LAB — TYPE AND SCREEN
ABO/RH(D): O POS
Antibody Screen: NEGATIVE
Unit division: 0
Unit division: 0

## 2018-11-15 LAB — CBC
HCT: 34.3 % — ABNORMAL LOW (ref 36.0–46.0)
Hemoglobin: 11.1 g/dL — ABNORMAL LOW (ref 12.0–15.0)
MCH: 30.2 pg (ref 26.0–34.0)
MCHC: 32.4 g/dL (ref 30.0–36.0)
MCV: 93.5 fL (ref 80.0–100.0)
Platelets: 303 10*3/uL (ref 150–400)
RBC: 3.67 MIL/uL — ABNORMAL LOW (ref 3.87–5.11)
RDW: 15 % (ref 11.5–15.5)
WBC: 10.2 10*3/uL (ref 4.0–10.5)
nRBC: 0 % (ref 0.0–0.2)

## 2018-11-15 LAB — BASIC METABOLIC PANEL
Anion gap: 10 (ref 5–15)
BUN: 15 mg/dL (ref 8–23)
CO2: 22 mmol/L (ref 22–32)
Calcium: 8.6 mg/dL — ABNORMAL LOW (ref 8.9–10.3)
Chloride: 106 mmol/L (ref 98–111)
Creatinine, Ser: 0.73 mg/dL (ref 0.44–1.00)
GFR calc Af Amer: >60 mL/min (ref >60)
GFR calc non Af Amer: >60 mL/min (ref >60)
Glucose, Bld: 136 mg/dL — ABNORMAL HIGH (ref 70–99)
Potassium: 4.8 mmol/L (ref 3.5–5.1)
Sodium: 138 mmol/L (ref 135–145)

## 2018-11-15 LAB — BPAM RBC
Blood Product Expiration Date: 202006022359
Blood Product Expiration Date: 202006032359
ISSUE DATE / TIME: 202005111010
ISSUE DATE / TIME: 202005111229
Unit Type and Rh: 5100
Unit Type and Rh: 5100

## 2018-11-15 MED ORDER — VITAMIN B-12 1000 MCG PO TABS
1000.0000 ug | ORAL_TABLET | Freq: Every day | ORAL | Status: DC
  Administered 2018-11-15 – 2018-11-17 (×3): 1000 ug via ORAL
  Filled 2018-11-15 (×3): qty 1

## 2018-11-15 MED ORDER — METHOCARBAMOL 500 MG PO TABS: 500.0000 mg | ORAL_TABLET | Freq: Four times a day (QID) | ORAL | 0 refills | Status: DC | PRN

## 2018-11-15 MED ORDER — METHOCARBAMOL 500 MG PO TABS: 500.0000 mg | ORAL_TABLET | Freq: Four times a day (QID) | ORAL | Status: DC | PRN

## 2018-11-15 MED ORDER — TRAMADOL HCL 50 MG PO TABS
100.0000 mg | ORAL_TABLET | Freq: Four times a day (QID) | ORAL | Status: DC
  Administered 2018-11-15 – 2018-11-17 (×9): 100 mg via ORAL
  Filled 2018-11-15 (×9): qty 2

## 2018-11-15 NOTE — Care Management Important Message (Signed)
Important Message  Patient Details IM Letter given to Dessa Phi RN to present to the Patient Name: Brandy [name] MRN: [id number] Date of Birth: 07-31-1932   Medicare Important Message Given:  Yes    Kerin Salen 11/15/2018, 10:14 AM

## 2018-11-15 NOTE — Progress Notes (Signed)
   Subjective: 1 Day Post-Op Procedure(s) (LRB): REMOVAL OF INTRAMEDULLARY NAIL AND POSTERIOR TOTAL HIP ARTHROPLASTY (Right) Patient reports pain as moderate.   Patient seen in rounds by Dr. Wynelle Link. Patient is well, and has had no acute complaints or problems other than pain in the right hip. No issues overnight. Foley catheter in place. We will begin therapy today.   Objective: Vital signs in last 24 hours: Temp:  [97.4 F (36.3 C)-99.8 F (37.7 C)] 99.6 F (37.6 C) (05/12 0423) Pulse Rate:  [85-114] 85 (05/12 0423) Resp:  [16-20] 18 (05/12 0423) BP: (112-180)/(63-85) 146/67 (05/12 0540) SpO2:  [94 %-100 %] 98 % (05/12 0423) Weight:  [65.8 kg] 65.8 kg (05/11 0954)  Intake/Output from previous day:  Intake/Output Summary (Last 24 hours) at 11/15/2018 0711 Last data filed at 11/15/2018 0600 Gross per 24 hour  Intake 3780.18 ml  Output 2050 ml  Net 1730.18 ml    Labs: Recent Labs    11/12/18 0753 11/13/18 0535 11/14/18 0412 11/15/18 0547  HGB 10.6* 8.6* 8.5* 11.1*   Recent Labs    11/14/18 0412 11/15/18 0547  WBC 6.9 10.2  RBC 2.80* 3.67*  HCT 28.1* 34.3*  PLT 347 303   Recent Labs    11/15/18 0547  NA 138  K 4.8  CL 106  CO2 22  BUN 15  CREATININE 0.73  GLUCOSE 136*  CALCIUM 8.6*   Exam: General - Patient is Alert and Oriented Extremity - Neurologically intact Neurovascular intact Sensation intact distally Dorsiflexion/Plantar flexion intact Dressing - dressing C/D/I Motor Function - intact, moving foot and toes well on exam.   Past Medical History:  Diagnosis Date  . Anemia   . Arthritis    shoulders  . Asthma    mild, no inhalers used  . Breast cancer ([place]) 12/18/11   left breast masectomy=metastatic ca in (1/1) lymph node ,invasive ductal ca,2 foci,,dcis,lymph ovascular invasion identified,surgical resection margins neg for ca,additional tissue=benign skin and subcutaneous tissue  . Bronchitis    hx of;last time >41yr ago  . Depression     takes Celexa daily  . Difficult intubation   . Dysphagia   . Full dentures   . Gall stones 2015  . GERD (gastroesophageal reflux disease)    takes Prilosec prn  . H/O hiatal hernia   . History of kidney stones    many yrs ago  . Hx of radiation therapy 04/26/12 -06/10/12   left breast  . Hyperlipidemia    takes Simvastatin daily  . Hypertension    takes Amlodipine and Diovan daily  . Insomnia    takes Ambien nightly  . Urinary urgency     Assessment/Plan: 1 Day Post-Op Procedure(s) (LRB): REMOVAL OF INTRAMEDULLARY NAIL AND POSTERIOR TOTAL HIP ARTHROPLASTY (Right) Principal Problem:   Hip fracture (HCC) Active Problems:   Essential hypertension   Moderate aortic stenosis   Iron deficiency anemia due to chronic blood loss   Vitamin B 12 deficiency  Estimated body mass index is 29.29 kg/m as calculated from the following:   Height as of this encounter: 4\' 11"  (1.499 m).   Weight as of this encounter: 65.8 kg. Up with therapy  DVT Prophylaxis - Lovenox Weight bearing as tolerated D/C knee immobilizer Hemovac pulled without difficulty Begin therapy Hip precautions discussed with patient  Hemoglobin improved at 11.1 this AM, 2 units RBCs were transfused intraoperatively yesterday.  Disposition will be per medical team.   Theresa Duty, PA-C Orthopedic Surgery 11/15/2018, 7:11 AM

## 2018-11-15 NOTE — Progress Notes (Signed)
Report received from I. Oraegbunam,RN. No change in assessment. Continue plan of care. Brandy [name]

## 2018-11-15 NOTE — Evaluation (Addendum)
Physical Therapy Evaluation Patient Details Name: Brandy [name] MRN: [id number] DOB: 11/16/1932 Today's Date: 11/15/2018   History of Present Illness  85 y.o. female admitted for R hip fx s/p IM nail on 10/24/18, then admitted again for PNA late April 2020, now readmitted with a fall and R hip fracture. S/p conversion to posterior THA on 11/14/18.   Clinical Impression  Pt is s/p THA resulting in the deficits listed below (see PT Problem List). +2 total assist for supine to sit. Pt sat at edge of bed x ~4 minutes then became dizzy, so assisted her back to supine. Vitals stable in supine. Instructed pt in posterior hip precautions and initiated THA HEP.  ST-SNF recommended, pt stated she prefers to DC home with her husband and daughter assisting.  Pt will benefit from skilled PT to increase their independence and safety with mobility to allow discharge to the venue listed below.      Follow Up Recommendations Supervision/Assistance - 24 hour;SNF;Supervision for mobility/OOB    Equipment Recommendations  None recommended by PT    Recommendations for Other Services       Precautions / Restrictions Precautions Precautions: Fall;Posterior Hip Precaution Booklet Issued: Yes (comment) Restrictions Weight Bearing Restrictions: No RLE Weight Bearing: Weight bearing as tolerated      Mobility  Bed Mobility Overal bed mobility: Needs Assistance Bed Mobility: Sidelying to Sit   Sidelying to sit: Total assist;+2 for physical assistance Supine to sit: Total assist     General bed mobility comments: assist to raise trunk, advance BLEs to edge of bed, and to pivot hips to edge of bed; posterior lean initially with sitting requiring Mod assist  Transfers                 General transfer comment: unable -pt became dizzy while sitting on edge of bed so returned to supine. Supine BP 152/64, HR 90s  Ambulation/Gait                Stairs            Wheelchair Mobility     Modified Rankin (Stroke Patients Only)       Balance Overall balance assessment: Needs assistance;History of Falls Sitting-balance support: Bilateral upper extremity supported;Feet supported Sitting balance-Leahy Scale: Poor Sitting balance - Comments: initially required mod assist 2* posterior lean, then able to maintain sitting balance with BUE/LEs supported, became dizzy after ~ 4 minutes of sitting Postural control: Posterior lean                                   Pertinent Vitals/Pain Faces Pain Scale: Hurts little more Pain Location: R hip with movement Pain Descriptors / Indicators: Grimacing Pain Intervention(s): Limited activity within patient's tolerance;Monitored during session;Premedicated before session;Ice applied    Home Living Family/patient expects to be discharged to:: Private residence Living Arrangements: Spouse/significant other;Children(Pt to stay with daughter at DC) Available Help at Discharge: Family;Available 24 hours/day Type of Home: House Home Access: Level entry     Home Layout: One level Home Equipment: Walker - 2 wheels;Cane - single point;Shower seat      Prior Function Level of Independence: Needs assistance   Gait / Transfers Assistance Needed: Prior to hip fx pt modified independent using cane or walker. Since fx pt amb with walker   ADL's / Homemaking Assistance Needed: pt reports she was bathing independently using shower seat  Hand Dominance   Dominant Hand: Right    Extremity/Trunk Assessment   Upper Extremity Assessment Upper Extremity Assessment: Generalized weakness    Lower Extremity Assessment Lower Extremity Assessment: RLE deficits/detail RLE Deficits / Details: Limited by soreness, able to assist with heel slides and hip abduction AAROM RLE Sensation: WNL    Cervical / Trunk Assessment Cervical / Trunk Assessment: Kyphotic  Communication   Communication: HOH  Cognition  Arousal/Alertness: Awake/alert Behavior During Therapy: WFL for tasks assessed/performed Overall Cognitive Status: Within Functional Limits for tasks assessed                                        General Comments      Exercises  ankle pumps x 10 B AROM Heel slides x 10 RLE AAROM Hip abduction x 10 RLE AAROM   Assessment/Plan    PT Assessment Patient needs continued PT services  PT Problem List Decreased strength;Decreased activity tolerance;Decreased balance;Decreased mobility;Decreased knowledge of use of DME;Decreased knowledge of precautions       PT Treatment Interventions DME instruction;Gait training;Functional mobility training;Therapeutic activities;Therapeutic exercise;Balance training;Patient/family education    PT Goals (Current goals can be found in the Care Plan section)  Acute Rehab PT Goals Patient Stated Goal: go home  PT Goal Formulation: With patient Time For Goal Achievement: 11/29/18 Potential to Achieve Goals: Fair    Frequency Min 3X/week   Barriers to discharge        Co-evaluation               AM-PAC PT "6 Clicks" Mobility  Outcome Measure Help needed turning from your back to your side while in a flat bed without using bedrails?: Total Help needed moving from lying on your back to sitting on the side of a flat bed without using bedrails?: Total Help needed moving to and from a bed to a chair (including a wheelchair)?: Total Help needed standing up from a chair using your arms (e.g., wheelchair or bedside chair)?: Total Help needed to walk in hospital room?: Total Help needed climbing 3-5 steps with a railing? : Total 6 Click Score: 6    End of Session Equipment Utilized During Treatment: Gait belt Activity Tolerance: Treatment limited secondary to medical complications (Comment)(dizzy in sitting) Patient left: with call bell/phone within reach;in bed;with bed alarm set Nurse Communication: Mobility status;Need for  lift equipment;Precautions PT Visit Diagnosis: Unsteadiness on feet (R26.81);History of falling (Z91.81);Other abnormalities of gait and mobility (R26.89);Muscle weakness (generalized) (M62.81);Difficulty in walking, not elsewhere classified (R26.2);Pain Pain - Right/Left: Right Pain - part of body: Hip    Time: 7939-0300 PT Time Calculation (min) (ACUTE ONLY): 31 min   Charges:   PT Evaluation $PT Eval Moderate Complexity: 1 Mod PT Treatments $Therapeutic Activity: 8-22 mins       Blondell Reveal Kistler PT 11/15/2018  Acute Rehabilitation Services Pager [phone] Office [phone]

## 2018-11-15 NOTE — Progress Notes (Signed)
PROGRESS NOTE    Brandy [name]  FXT:[id number] DOB: 08/13/1932 DOA: 11/10/2018 PCP: Brandy Lass, MD   Brief Narrative: As per HPI: 85 y.o. female with history of moderate aortic stenosis, hypertension, chronic anemia, breast cancer was recently admitted for right hip fracture had undergone surgery following which patient was admitted again for respiratory failure secondary to pneumonia had a fall at home on Nov 09, 2018 2 days ago.  Patient states she tripped and fell and also at the same time hit her head.  Did not lose consciousness.  Did not have any chest pain shortness of breath nausea vomiting or diarrhea.  Since the fall patient has been having persistent pain in the right hip and decided to come to the ER last evening.  ED Course: In the ER work-up shows right hip fracture again at the site of recent surgery and on-call orthopedic surgeon was consulted.  Lab work show creatinine of 0.8 sodium 141 potassium 4 hemoglobin 8.6 platelets 330 EKG was normal sinus rhythm a chest x-ray shows persistent opacity which will need further work-up as outpatient with CT scan.  CT head was unremarkable CT of the right hip and pelvis was showing fracture of the right hip around the femoral neck.  5/11: s/p Surgery and 2 unit prbc during OR  Subjective: Having meal No pain. Doing weel " I WANT TO GO HOME, NOT ANY REHAB"  Assessment & Plan:   Right Hip fracture closed w Right IM nail failure: S/P removal of IM nain and post total hip arthroplasty 5/11 by ortho. Off foley. On lovenox for dvt prophylaxis. Got 2 unit s PRBC intraop, hb stable. Cont pain control. For PT/OT today  Essential hypertension: BP borderline controlled, likley from pain, not on meds. Monitor.  Moderate aortic stenosis : Asymptomatic with no shortness of breath or chest pain.  Chronic anemis from B12 deficiency and iron deficiency on supplements.  Hemoglobin up at 11.1 post 2 unit prbc during or 5/11  HLD- on statins  Recently  treated for pneumonia chest x-ray showed persistent opacity and need outpatient CAT scan.  COVID 19 Negative  DVT prophylaxis:SCD Code Status: DNR Family Communication:discussed w patient re POC.  She is adamant on going home w [place]. Called and updated daughter Brandy [name] and she prefers her to have home.  Disposition Plan: Remains inpatient pending clinical improvement. Will get up w PT today. Anticipate d/c home w [place] [facility] in am.   Consultants:  Orthopedics  Procedures:  Antimicrobials: Anti-infectives (From admission, onward)   Start     Dose/Rate Route Frequency Ordered Stop   11/14/18 1800  ceFAZolin (ANCEF) IVPB 1 g/50 mL premix     1 g 100 mL/hr over 30 Minutes Intravenous Every 6 hours 11/14/18 1554 11/15/18 0118   11/14/18 0930  ceFAZolin (ANCEF) IVPB 1 g/50 mL premix     1 g 100 mL/hr over 30 Minutes Intravenous  Once 11/14/18 0841 11/14/18 1147       Objective: Vitals:   11/15/18 0200 11/15/18 0423 11/15/18 0540 11/15/18 0733  BP: (!) 145/67 (!) 180/79 (!) 146/67   Pulse: 95 85    Resp: 18 18    Temp: 99 F (37.2 C) 99.6 F (37.6 C)    TempSrc: Oral Oral    SpO2: 98% 98%  98%  Weight:      Height:        Intake/Output Summary (Last 24 hours) at 11/15/2018 0843 Last data filed at 11/15/2018 0600 Gross per 24 hour  Intake 3780.18 ml  Output 1650 ml  Net 2130.18 ml   Filed Weights   11/11/18 0512 11/11/18 2021 11/14/18 0954  Weight: 64.7 kg 63.5 kg 65.8 kg   Weight change:   Body mass index is 29.29 kg/m.  Intake/Output from previous day: 05/11 0701 - 05/12 0700 In: 3780.2 [P.O.:660; I.V.:2286.4; Blood:533.8; IV Piggyback:300] Out: 2050 [Urine:1650; Blood:400] Intake/Output this shift: No intake/output data recorded.  Examination:  General exam: AAOX4, nad HEENT:PERRLA, Oral mucosa moist, Ear/Nose normal on gross exam Respiratory system: Bilateral equal air entry, no wheezing or crackles  Cardiovascular system: S1 & S2 heard,No JVD, murmurs.  Gastrointestinal system: Abdomen is  soft, non tender, non distended, BS +  Nervous System:Alert and oriented. No focal neurological deficits/moving extremities, sensation intact. Extremities: Right hip surgical site in dressing. Skin: No rashes, lesions, no icterus MSK: Normal muscle bulk,tone ,power  Medications:  Scheduled Meds: . acetaminophen  650 mg Oral Q6H  . albuterol  2.5 mg Inhalation Q6H WA  . atorvastatin  40 mg Oral Daily  . citalopram  20 mg Oral Daily  . dexamethasone  10 mg Intravenous Once  . docusate sodium  100 mg Oral BID  . enoxaparin (LOVENOX) injection  40 mg Subcutaneous Q24H  . ferrous sulfate  325 mg Oral Daily  . gabapentin  300 mg Oral QHS  . lactose free nutrition  237 mL Oral BID BM  . multivitamin with minerals  1 tablet Oral Daily  . pantoprazole  40 mg Oral Daily  . pneumococcal 23 valent vaccine  0.5 mL Intramuscular Tomorrow-1000  . polyethylene glycol  17 g Oral Daily   Continuous Infusions: . sodium chloride 75 mL/hr at 11/15/18 0825  . methocarbamol (ROBAXIN) IV      Data Reviewed: I have personally reviewed following labs and imaging studies  CBC: Recent Labs  Lab 11/11/18 0150 11/12/18 0753 11/13/18 0535 11/14/18 0412 11/15/18 0547  WBC 9.9 5.3 5.9 6.9 10.2  NEUTROABS 7.6  --   --   --   --   HGB 8.6* 10.6* 8.6* 8.5* 11.1*  HCT 28.5* 34.3* 28.3* 28.1* 34.3*  MCV 98.3 95.8 96.3 100.4* 93.5  PLT 330 236 209 347 517   Basic Metabolic Panel: Recent Labs  Lab 11/11/18 0150 11/15/18 0547  NA 141 138  K 4.0 4.8  CL 108 106  CO2 22 22  GLUCOSE 121* 136*  BUN 20 15  CREATININE 0.86 0.73  CALCIUM 9.2 8.6*   GFR: Estimated Creatinine Clearance: 43.9 mL/min (by C-G formula based on SCr of 0.73 mg/dL). Liver Function Tests: No results for input(s): AST, ALT, ALKPHOS, BILITOT, PROT, ALBUMIN in the last 168 hours. No results for input(s): LIPASE, AMYLASE in the last 168 hours. No results for input(s): AMMONIA in the last 168  hours. Coagulation Profile: Recent Labs  Lab 11/11/18 0303  INR 1.2   Cardiac Enzymes: No results for input(s): CKTOTAL, CKMB, CKMBINDEX, TROPONINI in the last 168 hours. BNP (last 3 results) No results for input(s): PROBNP in the last 8760 hours. HbA1C: No results for input(s): HGBA1C in the last 72 hours. CBG: No results for input(s): GLUCAP in the last 168 hours. Lipid Profile: No results for input(s): CHOL, HDL, LDLCALC, TRIG, CHOLHDL, LDLDIRECT in the last 72 hours. Thyroid Function Tests: No results for input(s): TSH, T4TOTAL, FREET4, T3FREE, THYROIDAB in the last 72 hours. Anemia Panel: No results for input(s): VITAMINB12, FOLATE, FERRITIN, TIBC, IRON, RETICCTPCT in the last 72 hours. Sepsis Labs: No results for  input(s): PROCALCITON, LATICACIDVEN in the last 168 hours.  Recent Results (from the past 240 hour(s))  SARS Coronavirus 2 (CEPHEID - Performed in [facility] hospital lab), Hosp Order     Status: None   Collection Time: 11/11/18  1:10 AM  Result Value Ref Range Status   SARS Coronavirus 2 NEGATIVE NEGATIVE Final    Comment: (NOTE) If result is NEGATIVE SARS-CoV-2 target nucleic acids are NOT DETECTED. The SARS-CoV-2 RNA is generally detectable in upper and lower  respiratory specimens during the acute phase of infection. The lowest  concentration of SARS-CoV-2 viral copies this assay can detect is 250  copies / mL. A negative result does not preclude SARS-CoV-2 infection  and should not be used as the sole basis for treatment or other  patient management decisions.  A negative result may occur with  improper specimen collection / handling, submission of specimen other  than nasopharyngeal swab, presence of viral mutation(s) within the  areas targeted by this assay, and inadequate number of viral copies  (<250 copies / mL). A negative result must be combined with clinical  observations, patient history, and epidemiological information. If result is POSITIVE  SARS-CoV-2 target nucleic acids are DETECTED. The SARS-CoV-2 RNA is generally detectable in upper and lower  respiratory specimens dur ing the acute phase of infection.  Positive  results are indicative of active infection with SARS-CoV-2.  Clinical  correlation with patient history and other diagnostic information is  necessary to determine patient infection status.  Positive results do  not rule out bacterial infection or co-infection with other viruses. If result is PRESUMPTIVE POSTIVE SARS-CoV-2 nucleic acids MAY BE PRESENT.   A presumptive positive result was obtained on the submitted specimen  and confirmed on repeat testing.  While 2019 novel coronavirus  (SARS-CoV-2) nucleic acids may be present in the submitted sample  additional confirmatory testing may be necessary for epidemiological  and / or clinical management purposes  to differentiate between  SARS-CoV-2 and other Sarbecovirus currently known to infect humans.  If clinically indicated additional testing with an alternate test  methodology [phone]) is advised. The SARS-CoV-2 RNA is generally  detectable in upper and lower respiratory sp ecimens during the acute  phase of infection. The expected result is Negative. Fact Sheet for Patients:  StrictlyIdeas.no Fact Sheet for Healthcare Providers: BankingDealers.co.za This test is not yet approved or cleared by the [place] FDA and has been authorized for detection and/or diagnosis of SARS-CoV-2 by FDA under an Emergency Use Authorization (EUA).  This EUA will remain in effect (meaning this test can be used) for the duration of the COVID-19 declaration under Section 564(b)(1) of the Act, 21 U.S.C. section 360bbb-3(b)(1), unless the authorization is terminated or revoked sooner. Performed at [facility], [place] [address], [place], [place] [address]       Radiology Studies: Dg Pelvis Portable  Result Date:  11/14/2018 CLINICAL DATA:  Right hip arthroplasty. EXAM: PORTABLE PELVIS 1-2 VIEWS COMPARISON:  CT right hip dated Nov 11, 2018. FINDINGS: The right hip demonstrates a total arthroplasty without evidence of hardware failure or complication. There is expected intra-articular air. There is no fracture or dislocation. The alignment is anatomic. Screw tracks from prior femoral neck and distal interlocking screws are noted. Post-surgical changes noted in the surrounding soft tissues with surgical drain in place. Vascular calcifications. IMPRESSION: 1. Interval right total hip arthroplasty without evidence of acute postoperative complication. Electronically Signed   By: Titus Dubin M.D.   On:  11/14/2018 14:33      LOS: 4 days   Time spent: More than 50% of that time was spent in counseling and/or coordination of care.  Antonieta Pert, MD [facility]  11/15/2018, 8:43 AM

## 2018-11-16 LAB — BASIC METABOLIC PANEL
Anion gap: 7 (ref 5–15)
BUN: 16 mg/dL (ref 8–23)
CO2: 25 mmol/L (ref 22–32)
Calcium: 8.8 mg/dL — ABNORMAL LOW (ref 8.9–10.3)
Chloride: 104 mmol/L (ref 98–111)
Creatinine, Ser: 0.6 mg/dL (ref 0.44–1.00)
GFR calc Af Amer: >60 mL/min (ref >60)
GFR calc non Af Amer: >60 mL/min (ref >60)
Glucose, Bld: 107 mg/dL — ABNORMAL HIGH (ref 70–99)
Potassium: 4.7 mmol/L (ref 3.5–5.1)
Sodium: 136 mmol/L (ref 135–145)

## 2018-11-16 LAB — CBC
HCT: 31 % — ABNORMAL LOW (ref 36.0–46.0)
Hemoglobin: 9.9 g/dL — ABNORMAL LOW (ref 12.0–15.0)
MCH: 30.2 pg (ref 26.0–34.0)
MCHC: 31.9 g/dL (ref 30.0–36.0)
MCV: 94.5 fL (ref 80.0–100.0)
Platelets: 255 10*3/uL (ref 150–400)
RBC: 3.28 MIL/uL — ABNORMAL LOW (ref 3.87–5.11)
RDW: 14.3 % (ref 11.5–15.5)
WBC: 10.8 10*3/uL — ABNORMAL HIGH (ref 4.0–10.5)
nRBC: 0 % (ref 0.0–0.2)

## 2018-11-16 MED ORDER — OXYCODONE HCL 5 MG PO TABS: 2.5000 mg | ORAL_TABLET | Freq: Four times a day (QID) | ORAL | 0 refills | Status: DC | PRN

## 2018-11-16 MED ORDER — ASPIRIN EC 325 MG PO TBEC: 325.0000 mg | DELAYED_RELEASE_TABLET | Freq: Two times a day (BID) | ORAL | 0 refills | Status: AC

## 2018-11-16 MED ORDER — TRAMADOL HCL 50 MG PO TABS: 100.0000 mg | ORAL_TABLET | Freq: Four times a day (QID) | ORAL | 0 refills | Status: DC

## 2018-11-16 NOTE — Progress Notes (Signed)
Physical Therapy Treatment Patient Details Name: Brandy [name] MRN: [id number] DOB: 04/26/1933 Today's Date: 11/16/2018    History of Present Illness 85 y.o. female admitted for R hip fx s/p IM nail on 10/24/18, then admitted again for PNA late April 2020, now readmitted with a fall and R hip fracture. S/p conversion to posterior THA on 11/14/18.     PT Comments    POD # 2 Pt very sharpe with good memory.  Remembers me from previous admits (hip fx/ABD surgery).  Assisted OOB to [facility]. General bed mobility comments: assist to raise trunk, advance BLEs to edge of bed, and to pivot hips to edge of bed; posterior lean initially with sitting requiring Mod assist.  General transfer comment: + 2 assist for safety and 75% VC's on THP.  Assisted from elevated bed to [facility] with difficulty completing 1/4 pivot due to increased pain. General Gait Details: very unsteady gait with decreased WBing tolerance R LE.   Choppy gait.  Poor self correction to midline.   Follow Up Recommendations  Supervision/Assistance - 24 hour;SNF;Supervision for mobility/OOB(pt plans to return home with family support)     Equipment Recommendations  None recommended by PT    Recommendations for Other Services       Precautions / Restrictions Precautions Precautions: Fall;Posterior Hip Precaution Booklet Issued: Yes (comment) Precaution Comments: pt recalls 0/3 THP so re educated and demonstrated  Restrictions Weight Bearing Restrictions: No RLE Weight Bearing: Weight bearing as tolerated    Mobility  Bed Mobility Overal bed mobility: Needs Assistance Bed Mobility: Supine to Sit     Supine to sit: Max assist     General bed mobility comments: assist to raise trunk, advance BLEs to edge of bed, and to pivot hips to edge of bed; posterior lean initially with sitting requiring Mod assist  Transfers Overall transfer level: Needs assistance Equipment used: Rolling walker (2 wheeled) Transfers: Sit to/from  [facility] Sit to Stand: Min assist;+2 physical assistance;+2 safety/equipment Stand pivot transfers: Mod assist;+2 physical assistance;+2 safety/equipment       General transfer comment: + 2 assist for safety and 75% VC's on THP.  Assisted from elevated bed to [facility] with difficulty completing 1/4 pivot due to increased pain.   Ambulation/Gait Ambulation/Gait assistance: Mod assist;+2 physical assistance;+2 safety/equipment Gait Distance (Feet): 8 Feet Assistive device: Rolling walker (2 wheeled) Gait Pattern/deviations: Step-to pattern;Decreased step length - right;Decreased step length - left;Decreased stance time - right;Trunk flexed Gait velocity: decreased    General Gait Details: very unsteady gait with decreased WBing tolerance R LE.   Choppy gait.  Poor self correction to midline.   Stairs             Wheelchair Mobility    Modified Rankin (Stroke Patients Only)       Balance                                            Cognition Arousal/Alertness: Awake/alert Behavior During Therapy: WFL for tasks assessed/performed Overall Cognitive Status: Within Functional Limits for tasks assessed                                 General Comments: sharpe and good historian      Exercises      General Comments  Pertinent Vitals/Pain Pain Assessment: 0-10 Pain Score: 4  Pain Location: R hip with movement Pain Descriptors / Indicators: Grimacing;Tender Pain Intervention(s): Monitored during session;Patient requesting pain meds-RN notified;Repositioned;Ice applied    Home Living                      Prior Function            PT Goals (current goals can now be found in the care plan section) Progress towards PT goals: Progressing toward goals    Frequency    Min 3X/week      PT Plan Current plan remains appropriate    Co-evaluation              AM-PAC PT "6 Clicks" Mobility    Outcome Measure  Help needed turning from your back to your side while in a flat bed without using bedrails?: A Lot Help needed moving from lying on your back to sitting on the side of a flat bed without using bedrails?: A Lot Help needed moving to and from a bed to a chair (including a wheelchair)?: A Lot Help needed standing up from a chair using your arms (e.g., wheelchair or bedside chair)?: A Lot Help needed to walk in hospital room?: A Lot Help needed climbing 3-5 steps with a railing? : Total 6 Click Score: 11    End of Session Equipment Utilized During Treatment: Gait belt Activity Tolerance: Patient limited by fatigue Patient left: in chair;with call bell/phone within reach;with chair alarm set Nurse Communication: Mobility status PT Visit Diagnosis: Unsteadiness on feet (R26.81);History of falling (Z91.81);Other abnormalities of gait and mobility (R26.89);Muscle weakness (generalized) (M62.81);Difficulty in walking, not elsewhere classified (R26.2);Pain Pain - Right/Left: Right Pain - part of body: Hip     Time: 1135-1200 PT Time Calculation (min) (ACUTE ONLY): 25 min  Charges:  $Gait Training: 8-22 mins $Therapeutic Activity: 8-22 mins                     Rica Koyanagi  PTA Acute  Rehabilitation Services Pager      [phone] Office      [phone]

## 2018-11-16 NOTE — Progress Notes (Signed)
Physical Therapy Treatment Patient Details Name: Brandy [name] MRN: [id number] DOB: 1932/07/12 Today's Date: 11/16/2018    History of Present Illness 85 y.o. female admitted for R hip fx s/p IM nail on 10/24/18, then admitted again for PNA late April 2020, now readmitted with a fall and R hip fracture. S/p conversion to posterior THA on 11/14/18.     PT Comments    POD # 2 pm session Assisted out of recliner to amb a greater distance then assisted back to bed.  Re educated on THP as this is new to her.  Positioned to comfort and applied ICE.  Pt staying "one more day"  To improve her mobility level such that family can assist at +1 assist vs current +2.     Follow Up Recommendations  Supervision/Assistance - 24 hour;SNF;Supervision for mobility/OOB(pt plans to return home with family support)     Equipment Recommendations  None recommended by PT    Recommendations for Other Services       Precautions / Restrictions Precautions Precautions: Fall;Posterior Hip Precaution Booklet Issued: Yes (comment) Precaution Comments: pt recalls 0/3 THP so re educated and demonstrated  Restrictions Weight Bearing Restrictions: No RLE Weight Bearing: Weight bearing as tolerated    Mobility  Bed Mobility Overal bed mobility: Needs Assistance Bed Mobility: Sit to Supine     Supine to sit: Max assist Sit to supine: +2 for safety/equipment;Mod assist   General bed mobility comments: assisted back to bed + 2 assist and positioned to comfort and THP  Transfers Overall transfer level: Needs assistance Equipment used: Rolling walker (2 wheeled) Transfers: Sit to/from [facility] Sit to Stand: Min assist;+2 physical assistance;+2 safety/equipment Stand pivot transfers: Mod assist;+2 physical assistance;+2 safety/equipment       General transfer comment: + 2 assist for safety and 25% VC's on proper hand placement to push off and hand placement with stand to sit to control  decend.   Ambulation/Gait Ambulation/Gait assistance: Mod assist;+2 physical assistance;+2 safety/equipment Gait Distance (Feet): 11 Feet Assistive device: Rolling walker (2 wheeled) Gait Pattern/deviations: Step-to pattern;Decreased step length - right;Decreased step length - left;Decreased stance time - right;Trunk flexed Gait velocity: decreased    General Gait Details: Unsteady gait with decreased R LE step length and WBing due to pain.     Stairs             Wheelchair Mobility    Modified Rankin (Stroke Patients Only)       Balance                                            Cognition Arousal/Alertness: Awake/alert Behavior During Therapy: WFL for tasks assessed/performed Overall Cognitive Status: Within Functional Limits for tasks assessed                                 General Comments: sharpe and good historian      Exercises      General Comments        Pertinent Vitals/Pain Pain Assessment: 0-10 Pain Score: 4  Pain Location: R hip with movement Pain Descriptors / Indicators: Grimacing;Tender Pain Intervention(s): Monitored during session;Patient requesting pain meds-RN notified;Repositioned;Ice applied    Home Living  Prior Function            PT Goals (current goals can now be found in the care plan section) Progress towards PT goals: Progressing toward goals    Frequency    Min 3X/week      PT Plan Current plan remains appropriate    Co-evaluation              AM-PAC PT "6 Clicks" Mobility   Outcome Measure  Help needed turning from your back to your side while in a flat bed without using bedrails?: A Lot Help needed moving from lying on your back to sitting on the side of a flat bed without using bedrails?: A Lot Help needed moving to and from a bed to a chair (including a wheelchair)?: A Lot Help needed standing up from a chair using your arms (e.g.,  wheelchair or bedside chair)?: A Lot Help needed to walk in hospital room?: A Lot Help needed climbing 3-5 steps with a railing? : Total 6 Click Score: 11    End of Session Equipment Utilized During Treatment: Gait belt Activity Tolerance: Patient limited by fatigue Patient left: in chair;with call bell/phone within reach;with chair alarm set Nurse Communication: Mobility status PT Visit Diagnosis: Unsteadiness on feet (R26.81);History of falling (Z91.81);Other abnormalities of gait and mobility (R26.89);Muscle weakness (generalized) (M62.81);Difficulty in walking, not elsewhere classified (R26.2);Pain Pain - Right/Left: Right Pain - part of body: Hip     Time: 1400-1425 PT Time Calculation (min) (ACUTE ONLY): 25 min  Charges:  $Gait Training: 8-22 mins $Therapeutic Activity: 8-22 mins                     Rica Koyanagi  PTA Acute  Rehabilitation Services Pager      [phone] Office      [phone]

## 2018-11-16 NOTE — Progress Notes (Signed)
PROGRESS NOTE    Brandy [name]  JJH:[id number] DOB: 10/12/1932 DOA: 11/10/2018 PCP: Kathyrn Lass, MD    Brief Narrative:   Patient is 85 year old female with history of moderate aortic stenosis, hypertension, chronic anemia, breast cancer who was recently treated for right hip fracture.  Patient went home and in 2 days she tripped and fell with recurrence of pain on her right hip.  Patient was found to have rib fracture at the site of recent surgery around the femoral neck.  Patient underwent Removal of intramedullary nail and posterior total hip arthroplasty on the right side on 11/14/2018.  Assessment & Plan:   Principal Problem:   Hip fracture ([place]) Active Problems:   Essential hypertension   Moderate aortic stenosis   Iron deficiency anemia due to chronic blood loss   Vitamin B 12 deficiency  Closed traumatic right hip fracture: Status post removal of IM nail and hip arthroplasty.  Foley catheter out.  On Lovenox for DVT prophylaxis.  Received 2 units of PRBC intraoperative.  Continue adequate pain control.  Continue to work with PT OT.  They recommended skilled nursing facility for inpatient PT. Patient and family willing to go home to continue therapies at home. She will work tomorrow again with therapies to determine final destination and disposition.  Surgically stable as per surgery.  Essential hypertension: Blood pressures are well controlled.  Not on medications.  Chronic anemia is from B12 recency and iron deficiency: She is on supplements.  Hemoglobin has remained stable now.  Hyperlipidemia: On a statin.  Recently treated pneumonia: Chest x-ray showed persistent opacity, it may be too early to repeat one.  She will need outpatient follow-up.  COVID-19 ruled out.   DVT prophylaxis: Lovenox subcu Code Status: DNR Family Communication: Discussed with patient's daughter Disposition Plan: Home with home health PT anticipate tomorrow   Consultants:   Orthopedics   Procedures:   Right total hip, 11/14/2018  Antimicrobials:   None   Subjective: Patient was seen and examined.  Still has some pain on her right hip.  She needed to person assist to get out of bed and go to the commode.  Very much looking forward to go home.  No other overnight events.  Hemoglobin 9.9 and stabilizing.  Objective: Vitals:   11/15/18 2055 11/16/18 0500 11/16/18 0909 11/16/18 0923  BP: (!) 149/73 131/73  (!) 127/54  Pulse: 87 85  89  Resp: 18 18  20   Temp: 98.5 F (36.9 C) 98.7 F (37.1 C)  98.4 F (36.9 C)  TempSrc: Oral   Oral  SpO2: 93% 95% 93% 94%  Weight:      Height:        Intake/Output Summary (Last 24 hours) at 11/16/2018 1649 Last data filed at 11/16/2018 0932 Gross per 24 hour  Intake 1994.95 ml  Output 800 ml  Net 1194.95 ml   Filed Weights   11/11/18 0512 11/11/18 2021 11/14/18 0954  Weight: 64.7 kg 63.5 kg 65.8 kg    Examination:  General exam: Appears calm and comfortable, not in any distress at rest.  Anxious on mobility. Respiratory system: Clear to auscultation. Respiratory effort normal. Cardiovascular system: S1 & S2 heard, RRR. No JVD, murmurs, rubs, gallops or clicks. No pedal edema. Gastrointestinal system: Abdomen is nondistended, soft and nontender. No organomegaly or masses felt. Normal bowel sounds heard. Central nervous system: Alert and oriented. No focal neurological deficits. Extremities: Symmetric 5 x 5 power.  Patient does have postsurgical swelling on right thigh.  Skin: No rashes, lesions or ulcers, right lateral hip incision clean and dry.  Distal neurovascular status intact. Psychiatry: Judgement and insight appear normal. Mood & affect appropriate.     Data Reviewed: I have personally reviewed following labs and imaging studies  CBC: Recent Labs  Lab 11/11/18 0150 11/12/18 0753 11/13/18 0535 11/14/18 0412 11/15/18 0547 11/16/18 0604  WBC 9.9 5.3 5.9 6.9 10.2 10.8*  NEUTROABS 7.6  --   --   --   --   --    HGB 8.6* 10.6* 8.6* 8.5* 11.1* 9.9*  HCT 28.5* 34.3* 28.3* 28.1* 34.3* 31.0*  MCV 98.3 95.8 96.3 100.4* 93.5 94.5  PLT 330 236 209 347 303 299   Basic Metabolic Panel: Recent Labs  Lab 11/11/18 0150 11/15/18 0547 11/16/18 0604  NA 141 138 136  K 4.0 4.8 4.7  CL 108 106 104  CO2 22 22 25   GLUCOSE 121* 136* 107*  BUN 20 15 16   CREATININE 0.86 0.73 0.60  CALCIUM 9.2 8.6* 8.8*   GFR: Estimated Creatinine Clearance: 43.9 mL/min (by C-G formula based on SCr of 0.6 mg/dL). Liver Function Tests: No results for input(s): AST, ALT, ALKPHOS, BILITOT, PROT, ALBUMIN in the last 168 hours. No results for input(s): LIPASE, AMYLASE in the last 168 hours. No results for input(s): AMMONIA in the last 168 hours. Coagulation Profile: Recent Labs  Lab 11/11/18 0303  INR 1.2   Cardiac Enzymes: No results for input(s): CKTOTAL, CKMB, CKMBINDEX, TROPONINI in the last 168 hours. BNP (last 3 results) No results for input(s): PROBNP in the last 8760 hours. HbA1C: No results for input(s): HGBA1C in the last 72 hours. CBG: No results for input(s): GLUCAP in the last 168 hours. Lipid Profile: No results for input(s): CHOL, HDL, LDLCALC, TRIG, CHOLHDL, LDLDIRECT in the last 72 hours. Thyroid Function Tests: No results for input(s): TSH, T4TOTAL, FREET4, T3FREE, THYROIDAB in the last 72 hours. Anemia Panel: No results for input(s): VITAMINB12, FOLATE, FERRITIN, TIBC, IRON, RETICCTPCT in the last 72 hours. Sepsis Labs: No results for input(s): PROCALCITON, LATICACIDVEN in the last 168 hours.  Recent Results (from the past 240 hour(s))  SARS Coronavirus 2 (CEPHEID - Performed in [facility] lab), Hosp Order     Status: None   Collection Time: 11/11/18  1:10 AM  Result Value Ref Range Status   SARS Coronavirus 2 NEGATIVE NEGATIVE Final    Comment: (NOTE) If result is NEGATIVE SARS-CoV-2 target nucleic acids are NOT DETECTED. The SARS-CoV-2 RNA is generally detectable in upper and  lower  respiratory specimens during the acute phase of infection. The lowest  concentration of SARS-CoV-2 viral copies this assay can detect is 250  copies / mL. A negative result does not preclude SARS-CoV-2 infection  and should not be used as the sole basis for treatment or other  patient management decisions.  A negative result may occur with  improper specimen collection / handling, submission of specimen other  than nasopharyngeal swab, presence of viral mutation(s) within the  areas targeted by this assay, and inadequate number of viral copies  (<250 copies / mL). A negative result must be combined with clinical  observations, patient history, and epidemiological information. If result is POSITIVE SARS-CoV-2 target nucleic acids are DETECTED. The SARS-CoV-2 RNA is generally detectable in upper and lower  respiratory specimens dur ing the acute phase of infection.  Positive  results are indicative of active infection with SARS-CoV-2.  Clinical  correlation with patient history and other diagnostic information  is  necessary to determine patient infection status.  Positive results do  not rule out bacterial infection or co-infection with other viruses. If result is PRESUMPTIVE POSTIVE SARS-CoV-2 nucleic acids MAY BE PRESENT.   A presumptive positive result was obtained on the submitted specimen  and confirmed on repeat testing.  While 2019 novel coronavirus  (SARS-CoV-2) nucleic acids may be present in the submitted sample  additional confirmatory testing may be necessary for epidemiological  and / or clinical management purposes  to differentiate between  SARS-CoV-2 and other Sarbecovirus currently known to infect humans.  If clinically indicated additional testing with an alternate test  methodology [phone]) is advised. The SARS-CoV-2 RNA is generally  detectable in upper and lower respiratory sp ecimens during the acute  phase of infection. The expected result is Negative.  Fact Sheet for Patients:  StrictlyIdeas.no Fact Sheet for Healthcare Providers: BankingDealers.co.za This test is not yet approved or cleared by the [place] FDA and has been authorized for detection and/or diagnosis of SARS-CoV-2 by FDA under an Emergency Use Authorization (EUA).  This EUA will remain in effect (meaning this test can be used) for the duration of the COVID-19 declaration under Section 564(b)(1) of the Act, 21 U.S.C. section 360bbb-3(b)(1), unless the authorization is terminated or revoked sooner. Performed at [facility], [place] [address] [place], [place] [address]          Radiology Studies: No results found.      Scheduled Meds: . acetaminophen  650 mg Oral Q6H  . albuterol  2.5 mg Inhalation Q6H WA  . atorvastatin  40 mg Oral Daily  . citalopram  20 mg Oral Daily  . docusate sodium  100 mg Oral BID  . enoxaparin (LOVENOX) injection  40 mg Subcutaneous Q24H  . ferrous sulfate  325 mg Oral Daily  . gabapentin  300 mg Oral QHS  . lactose free nutrition  237 mL Oral BID BM  . multivitamin with minerals  1 tablet Oral Daily  . pantoprazole  40 mg Oral Daily  . pneumococcal 23 valent vaccine  0.5 mL Intramuscular Tomorrow-1000  . polyethylene glycol  17 g Oral Daily  . traMADol  100 mg Oral QID  . vitamin B-12  1,000 mcg Oral Daily   Continuous Infusions: . sodium chloride 75 mL/hr at 11/16/18 1644  . methocarbamol (ROBAXIN) IV       LOS: 5 days    Time spent: 25 minutes    Barb Merino, MD [facility] Pager [phone]  If 7PM-7AM, please contact night-coverage [email or web address] Password [facility] 11/16/2018, 4:49 PM

## 2018-11-16 NOTE — Progress Notes (Signed)
Subjective: 2 Days Post-Op Procedure(s) (LRB): REMOVAL OF INTRAMEDULLARY NAIL AND POSTERIOR TOTAL HIP ARTHROPLASTY (Right) Patient reports pain as mild.   Patient seen in rounds for Dr. Wynelle Link. Patient is well, and has had no acute complaints or problems. States she is ready to go home. Pain well controlled. Plan is to go Home after hospital stay.  Objective: Vital signs in last 24 hours: Temp:  [98.5 F (36.9 C)-99.3 F (37.4 C)] 98.7 F (37.1 C) (05/13 0500) Pulse Rate:  [83-87] 85 (05/13 0500) Resp:  [16-18] 18 (05/13 0500) BP: (131-152)/(69-73) 131/73 (05/13 0500) SpO2:  [93 %-96 %] 95 % (05/13 0500)  Intake/Output from previous day:  Intake/Output Summary (Last 24 hours) at 11/16/2018 0819 Last data filed at 11/15/2018 1853 Gross per 24 hour  Intake 838.7 ml  Output 600 ml  Net 238.7 ml    Labs: Recent Labs    11/14/18 0412 11/15/18 0547 11/16/18 0604  HGB 8.5* 11.1* 9.9*   Recent Labs    11/15/18 0547 11/16/18 0604  WBC 10.2 10.8*  RBC 3.67* 3.28*  HCT 34.3* 31.0*  PLT 303 255   Recent Labs    11/15/18 0547 11/16/18 0604  NA 138 136  K 4.8 4.7  CL 106 104  CO2 22 25  BUN 15 16  CREATININE 0.73 0.60  GLUCOSE 136* 107*  CALCIUM 8.6* 8.8*   Exam: General - Patient is Alert and Oriented Extremity - Neurologically intact Neurovascular intact Sensation intact distally Dorsiflexion/Plantar flexion intact Dressing/Incision - clean, dry, no drainage Motor Function - intact, moving foot and toes well on exam.   Past Medical History:  Diagnosis Date  . Anemia   . Arthritis    shoulders  . Asthma    mild, no inhalers used  . Breast cancer ([place]) 12/18/11   left breast masectomy=metastatic ca in (1/1) lymph node ,invasive ductal ca,2 foci,,dcis,lymph ovascular invasion identified,surgical resection margins neg for ca,additional tissue=benign skin and subcutaneous tissue  . Bronchitis    hx of;last time >59yr ago  . Depression    takes Celexa  daily  . Difficult intubation   . Dysphagia   . Full dentures   . Gall stones 2015  . GERD (gastroesophageal reflux disease)    takes Prilosec prn  . H/O hiatal hernia   . History of kidney stones    many yrs ago  . Hx of radiation therapy 04/26/12 -06/10/12   left breast  . Hyperlipidemia    takes Simvastatin daily  . Hypertension    takes Amlodipine and Diovan daily  . Insomnia    takes Ambien nightly  . Urinary urgency     Assessment/Plan: 2 Days Post-Op Procedure(s) (LRB): REMOVAL OF INTRAMEDULLARY NAIL AND POSTERIOR TOTAL HIP ARTHROPLASTY (Right) Principal Problem:   Hip fracture ([facility]) Active Problems:   Essential hypertension   Moderate aortic stenosis   Iron deficiency anemia due to chronic blood loss   Vitamin B 12 deficiency  Estimated body mass index is 29.29 kg/m as calculated from the following:   Height as of this encounter: 4\' 11"  (1.499 m).   Weight as of this encounter: 65.8 kg. Up with therapy  DVT Prophylaxis - Lovenox, will discharge home on aspirin regimen Weight-bearing as tolerated D/C knee immobilizer  Plan for discharge to home with home health once cleared by medical team. Pt going home to stay with daughter. Reiterated hip precautions with patient. Follow-up in the office in 2 weeks.   Theresa Duty, PA-C Orthopedic Surgery 11/16/2018,  8:19 AM

## 2018-11-16 NOTE — TOC Initial Note (Signed)
Transition of Care [facility]) - Initial/Assessment Note    Patient Details  Name: Brandy [name] MRN: [id number] Date of Birth: [birth year]  [facility] CM/SW Contact:    Wende Neighbors, LCSW Phone Number: 11/16/2018, 12:53 PM  Clinical Narrative:   Spoke with patient at bedside. Patient stated she wants to go home with home health. Patient stated she will go home with adult daughter. Patient gave CSW verbal permission to talk to daughter via phone on speaker. Daughter verified that patient will come home with her and she stated she would like [facility] to continue to follow patient upon discharge. CSW made [facility] aware and they stated they will be able to pick her back up for home health needs       Expected Discharge Plan: [place] Barriers to Discharge: Continued Medical Work up   Patient Goals and CMS Choice Patient states their goals for this hospitalization and ongoing recovery are:: to go home CMS Medicare.gov Compare Post Acute Care list provided to:: Patient Choice offered to / list presented to : Patient, Adult Children  Expected Discharge Plan and Services Expected Discharge Plan: [place] In-house Referral: Clinical Social Work Discharge Planning Services: CM Consult Post Acute Care Choice: Home Health, Resumption of Svcs/PTA Provider Living arrangements for the past 2 months: [place] Expected Discharge Date: 11/15/18               DME Arranged: N/A DME Agency: NA       HH Arranged: PT HH Agency: [place] Date [place]: 11/16/18 Time [place]: 1700 Representative spoke with at [place]: Reola Mosher with [facility]  Prior Living Arrangements/Services Living arrangements for the past 2 months: [place] Lives with:: Spouse Patient language and need for interpreter reviewed:: Yes Do you feel safe going back to the place where you live?: Yes      Need for Family  Participation in Patient Care: Yes (Comment) Care giver support system in place?: Yes (comment) Current home services: Home PT Criminal Activity/Legal Involvement Pertinent to Current Situation/Hospitalization: No - Comment as needed  Activities of Daily Living Home Assistive Devices/Equipment: Walker (specify type), Cane (specify quad or straight) ADL Screening (condition at time of admission) Patient's cognitive ability adequate to safely complete daily activities?: Yes Is the patient deaf or have difficulty hearing?: No Does the patient have difficulty seeing, even when wearing glasses/contacts?: No Does the patient have difficulty concentrating, remembering, or making decisions?: Yes Patient able to express need for assistance with ADLs?: Yes Does the patient have difficulty dressing or bathing?: Yes Independently performs ADLs?: No Communication: Independent Dressing (OT): Needs assistance Is this a change from baseline?: Pre-admission baseline Grooming: Needs assistance Is this a change from baseline?: Pre-admission baseline Feeding: Independent Bathing: Dependent Is this a change from baseline?: Pre-admission baseline Toileting: Dependent Is this a change from baseline?: Pre-admission baseline In/Out Bed: Dependent Is this a change from baseline?: Pre-admission baseline Walks in Home: Dependent Is this a change from baseline?: Pre-admission baseline Does the patient have difficulty walking or climbing stairs?: Yes Weakness of Legs: Right Weakness of Arms/Hands: None  Permission Sought/Granted Permission sought to share information with : Family Supports Permission granted to share information with : Yes, Verbal Permission Granted  Share Information with NAME: Casey Burkitt [phone],  [phone]     Permission granted to share info w Relationship: daughter     Emotional Assessment Appearance:: Appears stated  age Attitude/Demeanor/Rapport: Engaged Affect  (typically observed): Accepting Orientation: : Oriented to Self, Oriented to Place, Oriented to  Time, Oriented to Situation Alcohol / Substance Use: Not Applicable Psych Involvement: No (comment)  Admission diagnosis:  Pre-op chest exam [E31.540] Fall, initial encounter [W19.XXXA] Pain of right hip joint [M25.551] Hip fracture ([place]) [S72.009A] Patient Active Problem List   Diagnosis Date Noted  . Hip fracture ([place]) 11/11/2018  . Acute respiratory failure with hypoxia ([place]) 10/29/2018  . S/P right hip fracture 10/24/2018  . Acute on chronic diastolic CHF (congestive heart failure) ([place]) 01/19/2017  . HCAP (healthcare-associated pneumonia) 12/11/2016  . S/P exploratory laparotomy 11/17/2016  . Small bowel obstruction ([place]) 11/17/2016  . Pre-operative cardiovascular examination   . Incarcerated incisional hernia s/p SB resection & repair 11/17/2016 11/15/2016  . Acute renal failure (ARF) ([place]) 11/15/2016  . Hyperglycemia 11/15/2016  . Asthma without status asthmaticus 09/24/2016  . Carpal tunnel syndrome 09/24/2016  . Asthma 09/24/2016  . Dark stools 09/24/2016  . Degenerative arthritis of lumbar spine 09/24/2016  . DJD (degenerative joint disease), cervical 09/24/2016  . Dyspnea on exertion 09/24/2016  . Generalized anxiety disorder 09/24/2016  . Lumbosacral spondylosis without myelopathy 09/24/2016  . Morbid (severe) obesity due to excess calories ([place]) 09/24/2016  . Pernicious anemia 09/24/2016  . Pure hypercholesterolemia 09/24/2016  . Right knee pain 09/24/2016  . Sleep disorder 09/24/2016  . Vitamin B 12 deficiency 09/24/2016  . Vitamin D deficiency 09/24/2016  . Gastric ulcer   . Iron deficiency anemia due to chronic blood loss   . Abdominal pain, chronic, epigastric   . Acute gastric ulcer without hemorrhage or perforation   . Duodenal ulcer   . Closed fracture of nasal septum 08/27/2015  . Closed fracture of zygomatic arch ([place]) 08/27/2015  . Protein-calorie  malnutrition, severe ([place]) 12/18/2013  . Choledocholithiasis 12/17/2013  . Sepsis ([place]) 12/17/2013  . Osteopenia 11/17/2012  . Moderate aortic stenosis 10/17/2012  . Hx of radiation therapy   . Depression   . History of kidney stones   . Urinary urgency   . Gastroesophageal reflux disease without esophagitis   . Hyperlipidemia   . Essential hypertension   . Bronchitis   . Arthritis   . Dysphagia   . H/O hiatal hernia   . Primary cancer of upper outer quadrant of left female breast ([place]) 12/01/2011   PCP:  Kathyrn Lass, MD Pharmacy:   [id number] - [place], [place] [address] Phone: [phone] Fax: [phone]     Social Determinants of Health (SDOH) Interventions    Readmission Risk Interventions No flowsheet data found.

## 2018-11-16 NOTE — Discharge Instructions (Signed)
Dr. Gaynelle Arabian Total Joint Specialist [facility] [address], [place],  [address] [phone]  POSTERIOR TOTAL HIP REPLACEMENT POSTOPERATIVE DIRECTIONS  Hip Rehabilitation, Guidelines Following Surgery  The results of a hip operation are greatly improved after range of motion and muscle strengthening exercises. Follow all safety measures which are given to protect your hip. If any of these exercises cause increased pain or swelling in your joint, decrease the amount until you are comfortable again. Then slowly increase the exercises. Call your caregiver if you have problems or questions.   BLOOD CLOT PREVENTION Take one 325 mg aspirin two times a day for 21 days following surgery. Then take one 81 mg aspirin once a day for three weeks. Then discontinue aspirin.  HOME CARE INSTRUCTIONS   Remove items at home which could result in a fall. This includes throw rugs or furniture in walking pathways.   ICE to the affected hip every three hours for 30 minutes at a time and then as needed for pain and swelling.  Continue to use ice on the hip for pain and swelling from surgery. You may notice swelling that will progress down to the foot and ankle.  This is normal after surgery.  Elevate the leg when you are not up walking on it.    Continue to use the breathing machine which will help keep your temperature down.  It is common for your temperature to cycle up and down following surgery, especially at night when you are not up moving around and exerting yourself.  The breathing machine keeps your lungs expanded and your temperature down.  DIET You may resume your previous home diet once your are discharged from the hospital.  DRESSING / WOUND CARE / SHOWERING You may change your dressing 3-5 days after surgery.  Then change the dressing every day with sterile gauze.  Please use good hand washing techniques before changing the dressing.  Do not use any lotions or creams  on the incision until instructed by your surgeon. You may start showering once you are discharged home but do not submerge the incision under water. Just pat the incision dry and apply a dry gauze dressing on daily. Change the surgical dressing daily and reapply a dry dressing each time.  ACTIVITY Walk with your walker as instructed. Use walker as long as suggested by your caregivers. Avoid periods of inactivity such as sitting longer than an hour when not asleep. This helps prevent blood clots.  You may resume a sexual relationship in one month or when given the OK by your doctor.  You may return to work once you are cleared by your doctor.  Do not drive a car for 6 weeks or until released by you surgeon.  Do not drive while taking narcotics.  WEIGHT BEARING Weight bearing as tolerated with assist device (walker, cane, etc) as directed, use it as long as suggested by your surgeon or therapist, typically at least 4-6 weeks.  POSTOPERATIVE CONSTIPATION PROTOCOL Constipation - defined medically as fewer than three stools per week and severe constipation as less than one stool per week.  One of the most common issues patients have following surgery is constipation.  Even if you have a regular bowel pattern at home, your normal regimen is likely to be disrupted due to multiple reasons following surgery.  Combination of anesthesia, postoperative narcotics, change in appetite and fluid intake all can affect your bowels.  In order to avoid complications following surgery, here  are some recommendations in order to help you during your recovery period.  Colace (docusate) - Pick up an over-the-counter form of Colace or another stool softener and take twice a day as long as you are requiring postoperative pain medications.  Take with a full glass of water daily.  If you experience loose stools or diarrhea, hold the colace until you stool forms back up.  If your symptoms do not get better within 1 week or  if they get worse, check with your doctor.  Dulcolax (bisacodyl) - Pick up over-the-counter and take as directed by the product packaging as needed to assist with the movement of your bowels.  Take with a full glass of water.  Use this product as needed if not relieved by Colace only.   MiraLax (polyethylene glycol) - Pick up over-the-counter to have on hand.  MiraLax is a solution that will increase the amount of water in your bowels to assist with bowel movements.  Take as directed and can mix with a glass of water, juice, soda, coffee, or tea.  Take if you go more than two days without a movement. Do not use MiraLax more than once per day. Call your doctor if you are still constipated or irregular after using this medication for 7 days in a row.  If you continue to have problems with postoperative constipation, please contact the office for further assistance and recommendations.  If you experience "the worst abdominal pain ever" or develop nausea or vomiting, please contact the office immediatly for further recommendations for treatment.  ITCHING  If you experience itching with your medications, try taking only a single pain pill, or even half a pain pill at a time.  You can also use Benadryl over the counter for itching or also to help with sleep.   TED HOSE STOCKINGS Wear the elastic stockings on both legs for three weeks following surgery during the day but you may remove then at night for sleeping.  MEDICATIONS See your medication summary on the After Visit Summary that the nursing staff will review with you prior to discharge.  You may have some home medications which will be placed on hold until you complete the course of blood thinner medication.  It is important for you to complete the blood thinner medication as prescribed by your surgeon.  Continue your approved medications as instructed at time of discharge.  PRECAUTIONS If you experience chest pain or shortness of breath - call  [phone] immediately for transfer to the hospital emergency department.  If you develop a fever greater that 101 F, purulent drainage from wound, increased redness or drainage from wound, foul odor from the wound/dressing, or calf pain - CONTACT YOUR SURGEON.                                                   FOLLOW-UP APPOINTMENTS Make sure you keep all of your appointments after your operation with your surgeon and caregivers. You should call the office at the above phone number and make an appointment for approximately two weeks after the date of your surgery or on the date instructed by your surgeon outlined in the "After Visit Summary".  RANGE OF MOTION AND STRENGTHENING EXERCISES  These exercises are designed to help you keep full movement of your hip joint. Follow your caregiver's or physical therapist's  instructions. Perform all exercises about fifteen times, three times per day or as directed. Exercise both hips, even if you have had only one joint replacement. These exercises can be done on a training (exercise) mat, on the floor, on a table or on a bed. Use whatever works the best and is most comfortable for you. Use music or television while you are exercising so that the exercises are a pleasant break in your day. This will make your life better with the exercises acting as a break in routine you can look forward to.   Lying on your back, slowly slide your foot toward your buttocks, raising your knee up off the floor. Then slowly slide your foot back down until your leg is straight again.   Lying on your back spread your legs as far apart as you can without causing discomfort.   Lying on your side, raise your upper leg and foot straight up from the floor as far as is comfortable. Slowly lower the leg and repeat.   Lying on your back, tighten up the muscle in the front of your thigh (quadriceps muscles). You can do this by keeping your leg straight and trying to raise your heel off the floor.  This helps strengthen the largest muscle supporting your knee.   Lying on your back, tighten up the muscles of your buttocks both with the legs straight and with the knee bent at a comfortable angle while keeping your heel on the floor.   IF YOU ARE TRANSFERRED TO A SKILLED REHAB FACILITY If the patient is transferred to a skilled rehab facility following release from the hospital, a list of the current medications will be sent to the facility for the patient to continue.  When discharged from the skilled rehab facility, please have the facility set up the patient's [place] prior to being released. Also, the skilled facility will be responsible for providing the patient with their medications at time of release from the facility to include their pain medication, the muscle relaxants, and their blood thinner medication. If the patient is still at the rehab facility at time of the two week follow up appointment, the skilled rehab facility will also need to assist the patient in arranging follow up appointment in our office and any transportation needs.  MAKE SURE YOU:   Understand these instructions.   Get help right away if you are not doing well or get worse.    Pick up stool softner and laxative for home use following surgery while on pain medications. Do not submerge incision under water. Please use good hand washing techniques while changing dressing each day. May shower starting three days after surgery. Please use a clean towel to pat the incision dry following showers. Continue to use ice for pain and swelling after surgery. Do not use any lotions or creams on the incision until instructed by your surgeon.

## 2018-11-17 LAB — CBC
HCT: 29.7 % — ABNORMAL LOW (ref 36.0–46.0)
Hemoglobin: 9.2 g/dL — ABNORMAL LOW (ref 12.0–15.0)
MCH: 30.3 pg (ref 26.0–34.0)
MCHC: 31 g/dL (ref 30.0–36.0)
MCV: 97.7 fL (ref 80.0–100.0)
Platelets: 249 10*3/uL (ref 150–400)
RBC: 3.04 MIL/uL — ABNORMAL LOW (ref 3.87–5.11)
RDW: 14.3 % (ref 11.5–15.5)
WBC: 9.4 10*3/uL (ref 4.0–10.5)
nRBC: 0 % (ref 0.0–0.2)

## 2018-11-17 MED ORDER — ALBUTEROL SULFATE (2.5 MG/3ML) 0.083% IN NEBU: 2.5000 mg | INHALATION_SOLUTION | RESPIRATORY_TRACT | Status: DC | PRN

## 2018-11-17 NOTE — Progress Notes (Signed)
PT Note  Patient Details Name: Brandy [name] MRN: [id number] DOB: 1932-08-31   Spoke with pt's daughter [name] and she agree for me to send videos for exercises and hip precautions to her.   Access Code: [email or web address]  URL: [email or web address]  Date: 11/17/2018  Prepared by: [employer]   Exercises  Supine Ankle Pumps - 10 reps - 1 sets - 3x daily - 7x weekly  Supine Quadricep Sets - 10 reps - 1 sets - 3x daily - 7x weekly  Supine Heel Slide - 10 reps - 1 sets - 3x daily - 7x weekly  Supine Hip Abduction - 10 reps - 1 sets - 3x daily - 7x weekly  Seated Long Arc Quad - 10 reps - 1 sets - 3x daily - 7x weekly  Patient Education  THA Posterior Precautions Handout  THA Posterior Precautions  Sit to Stand with [employer] and Posterior Hip Precautions  Bed Transfer with Walker and Hip Precautions  Posterior Hip Precautions   Farren Landa 11/17/2018, 12:53 PM  Clide Dales, PT Acute Rehabilitation Services Pager: [phone] Office: [phone] 11/17/2018

## 2018-11-17 NOTE — Progress Notes (Signed)
Subjective: 3 Days Post-Op Procedure(s) (LRB): REMOVAL OF INTRAMEDULLARY NAIL AND POSTERIOR TOTAL HIP ARTHROPLASTY (Right) Patient reports pain as mild.   Patient seen in rounds by Dr. Wynelle Link. Patient is well, and has had no acute complaints or problems, states she is ready to go home. Denies chest pain or SOB. No issues overnight.  Plan is to go Home after hospital stay.  Objective: Vital signs in last 24 hours: Temp:  [98.4 F (36.9 C)-99.1 F (37.3 C)] 99 F (37.2 C) (05/14 0527) Pulse Rate:  [79-90] 80 (05/14 0527) Resp:  [17-21] 18 (05/14 0527) BP: (110-127)/(49-64) 127/55 (05/14 0527) SpO2:  [93 %-96 %] 94 % (05/14 0804)  Intake/Output from previous day:  Intake/Output Summary (Last 24 hours) at 11/17/2018 0808 Last data filed at 11/17/2018 0520 Gross per 24 hour  Intake 2217.75 ml  Output 850 ml  Net 1367.75 ml    Intake/Output this shift: No intake/output data recorded.  Labs: Recent Labs    11/15/18 0547 11/16/18 0604 11/17/18 0524  HGB 11.1* 9.9* 9.2*   Recent Labs    11/16/18 0604 11/17/18 0524  WBC 10.8* 9.4  RBC 3.28* 3.04*  HCT 31.0* 29.7*  PLT 255 249   Recent Labs    11/15/18 0547 11/16/18 0604  NA 138 136  K 4.8 4.7  CL 106 104  CO2 22 25  BUN 15 16  CREATININE 0.73 0.60  GLUCOSE 136* 107*  CALCIUM 8.6* 8.8*   No results for input(s): LABPT, INR in the last 72 hours.  Exam: General - Patient is Alert and Oriented Extremity - Neurologically intact Neurovascular intact Sensation intact distally Dorsiflexion/Plantar flexion intact Dressing/Incision - clean, dry, no drainage Motor Function - intact, moving foot and toes well on exam.   Past Medical History:  Diagnosis Date  . Anemia   . Arthritis    shoulders  . Asthma    mild, no inhalers used  . Breast cancer ([facility]) 12/18/11   left breast masectomy=metastatic ca in (1/1) lymph node ,invasive ductal ca,2 foci,,dcis,lymph ovascular invasion identified,surgical resection  margins neg for ca,additional tissue=benign skin and subcutaneous tissue  . Bronchitis    hx of;last time >30yr ago  . Depression    takes Celexa daily  . Difficult intubation   . Dysphagia   . Full dentures   . Gall stones 2015  . GERD (gastroesophageal reflux disease)    takes Prilosec prn  . H/O hiatal hernia   . History of kidney stones    many yrs ago  . Hx of radiation therapy 04/26/12 -06/10/12   left breast  . Hyperlipidemia    takes Simvastatin daily  . Hypertension    takes Amlodipine and Diovan daily  . Insomnia    takes Ambien nightly  . Urinary urgency     Assessment/Plan: 3 Days Post-Op Procedure(s) (LRB): REMOVAL OF INTRAMEDULLARY NAIL AND POSTERIOR TOTAL HIP ARTHROPLASTY (Right) Principal Problem:   Hip fracture (HCC) Active Problems:   Essential hypertension   Moderate aortic stenosis   Iron deficiency anemia due to chronic blood loss   Vitamin B 12 deficiency  Estimated body mass index is 29.29 kg/m as calculated from the following:   Height as of this encounter: 4\' 11"  (1.499 m).   Weight as of this encounter: 65.8 kg. Up with therapy  DVT Prophylaxis - Lovenox Weight-bearing as tolerated  Ready for discharge with HHPT from orthopedic standpoint. Will follow-up in the office in 2 weeks.   Theresa Duty, PA-C Orthopedic Surgery  11/17/2018, 8:08 AM

## 2018-11-17 NOTE — Progress Notes (Signed)
Nutrition Follow-up  INTERVENTION:   -Continue Boost Plus chocolate BID- Each supplement provides 360kcal and 14g protein.   -Continue Multivitamin with minerals daily  NUTRITION DIAGNOSIS:   Increased nutrient needs related to post-op healing as evidenced by estimated needs.  Ongoing.  GOAL:   Patient will meet greater than or equal to 90% of their needs  Progressing.  MONITOR:   PO intake, Supplement acceptance, Diet advancement, Labs, Weight trends, Skin, I & O's  ASSESSMENT:   Brandy [name] is a 85 y.o. female with history of moderate aortic stenosis, hypertension, chronic anemia, breast cancer was recently admitted for right hip fracture had undergone surgery following which patient was admitted again for respiratory failure secondary to pneumonia had a fall at home on Nov 09, 2018 2 days ago.  Patient states she tripped and fell and also at the same time hit her head.  Did not lose consciousness.  Did not have any chest pain shortness of breath nausea vomiting or diarrhea.  Since the fall patient has been having persistent pain in the right hip and decided to come to the ER last evening.  4/20: s/p Intramedullary nailing, Right intertrochanteric femur fracture.   5/8: admitted for right hip pain 5/11: s/p REMOVAL OF INTRAMEDULLARY NAIL AND POSTERIOR TOTAL HIP ARTHROPLASTY (Right)  5/12: diet advanced to regular diet  **RD working remotely**  Patient consuming 75-100% of meals since diet advancement on 5/12. Pt is drinking Boost Plus supplements and taking daily MVI.  Per chart review, pt ready for discharge per Orthopedics note.  Per weight records, no weight loss as of 5/11.  Labs reviewed. Medications: Ferrous sulfate tablet daily, Multivitamin with minerals daily, Vitamin B-12 tablet daily  Diet Order:   Diet Order            Diet regular Room service appropriate? Yes; Fluid consistency: Thin  Diet effective now        Diet - low sodium heart healthy               EDUCATION NEEDS:   No education needs have been identified at this time  Skin:  Skin Assessment: Skin Integrity Issues: Skin Integrity Issues:: Incisions Incisions: closed rt hip  Last BM:  5/12  Height:   Ht Readings from Last 1 Encounters:  11/14/18 4\' 11"  (1.499 m)    Weight:   Wt Readings from Last 1 Encounters:  11/14/18 65.8 kg    Ideal Body Weight:  44.5 kg  BMI:  Body mass index is 29.29 kg/m.  Estimated Nutritional Needs:   Kcal:  1600-1800  Protein:  80-95 grams  Fluid:  > 1.6 L  Clayton Bibles, MS, RD, LDN [place] Dietitian Pager: [phone] After Hours Pager: [phone]

## 2018-11-17 NOTE — TOC Transition Note (Signed)
Transition of Care [facility]) - CM/SW Discharge Note   Patient Details  Name: Brandy [name] MRN: [id number] Date of Birth: 04-Feb-1933  Transition of Care [facility]) CM/SW Contact:  Wende Neighbors, LCSW Phone Number: 11/17/2018, 11:30 AM   Clinical Narrative:  Patient is being followed by [place] upon discharge. CSW signing off     Final next level of care: [place] Barriers to Discharge: No Barriers Identified   Patient Goals and CMS Choice Patient states their goals for this hospitalization and ongoing recovery are:: to go home CMS Medicare.gov Compare Post Acute Care list provided to:: Patient Choice offered to / list presented to : Patient, Adult Children  Discharge Placement                Patient to be transferred to facility by: family Name of family member notified: daughter aware of dc Patient and family notified of of transfer: 11/17/18  Discharge Plan and Services In-house Referral: Clinical Social Work Discharge Planning Services: [employer] Consult Post Acute Care Choice: Home Health, Resumption of Svcs/PTA Provider          DME Arranged: N/A DME Agency: NA       HH Arranged: PT [place] Agency: [place] Date [place]: 11/16/18 Time [place]: 7614 Representative spoke with at [place]: Reola Mosher with [facility]  Social Determinants of Health (SDOH) Interventions     Readmission Risk Interventions No flowsheet data found.

## 2018-11-17 NOTE — Progress Notes (Signed)
Physical Therapy Treatment Patient Details Name: Brandy [name] MRN: [id number] DOB: 1933/06/02 Today's Date: 11/17/2018    History of Present Illness 85 y.o. female admitted for R hip fx s/p IM nail on 10/24/18, then admitted again for PNA late April 2020, now readmitted with a fall and R hip fracture. S/p conversion to posterior THA on 11/14/18.     PT Comments    POD # 3 Pt remains AxOx4 and eager to go home.  Assisted OOB to am,b to bathroom.  Assisted with hygiene then assisted with amb to recliner all at [place] Gait Details: Unsteady gait with decreased R LE step length and WBing due to pain.  Limited distance.  Unsteady.  HIGH FALL RISK.  Spoke with daughter Delores via phone and gave update on pt's mobility level and assist need.  "pt is walking about 5 feet and requires HANDS ON assist for all mobility.  Instructed on THP." Family has been caring for pt prior and are able to assist at current level.  They have all equipment, including a wheelchair.     Follow Up Recommendations  Home health PT(family plans to have pt return home and provide 24/7 care)  pt is able to transport via car......caution to Total Hip Precautions   Equipment Recommendations  None recommended by PT    Recommendations for Other Services       Precautions / Restrictions Precautions Precautions: Fall;Posterior Hip Precaution Comments: pt recalls 0/3 THP so re educated and demonstrated  Restrictions Weight Bearing Restrictions: No RLE Weight Bearing: Weight bearing as tolerated    Mobility  Bed Mobility Overal bed mobility: Needs Assistance Bed Mobility: Supine to Sit     Supine to sit: Max assist     General bed mobility comments: assisted OOB with increased time and bed pad to complete scooting to EOB.    Transfers Overall transfer level: Needs assistance Equipment used: Rolling walker (2 wheeled) Transfers: Sit to/from [facility] Sit to Stand: Mod  assist Stand pivot transfers: Mod assist;Max assist       General transfer comment: pt required increased assist esp with turns.  Unsteady.  Assisted with toilet transfer.  Ambulation/Gait Ambulation/Gait assistance: Mod assist Gait Distance (Feet): 10 Feet(5 feet to and from bathroom only) Assistive device: Rolling walker (2 wheeled) Gait Pattern/deviations: Step-to pattern;Decreased step length - right;Decreased step length - left;Decreased stance time - right;Trunk flexed Gait velocity: decreased    General Gait Details: Unsteady gait with decreased R LE step length and WBing due to pain.  Limited distance.  Unsteady.  HIGH FALL RISK.    Stairs             Wheelchair Mobility    Modified Rankin (Stroke Patients Only)       Balance                                            Cognition Arousal/Alertness: Awake/alert Behavior During Therapy: WFL for tasks assessed/performed Overall Cognitive Status: Within Functional Limits for tasks assessed                                 General Comments: sharpe and good historian      Exercises      General Comments        Pertinent  Vitals/Pain Pain Assessment: Faces Faces Pain Scale: Hurts little more Pain Location: R hip with movement Pain Descriptors / Indicators: Grimacing;Tender Pain Intervention(s): Monitored during session;Repositioned    Home Living                      Prior Function            PT Goals (current goals can now be found in the care plan section) Progress towards PT goals: Progressing toward goals    Frequency    Min 3X/week      PT Plan Current plan remains appropriate    Co-evaluation              AM-PAC PT "6 Clicks" Mobility   Outcome Measure  Help needed turning from your back to your side while in a flat bed without using bedrails?: A Lot Help needed moving from lying on your back to sitting on the side of a flat bed  without using bedrails?: A Lot Help needed moving to and from a bed to a chair (including a wheelchair)?: A Lot Help needed standing up from a chair using your arms (e.g., wheelchair or bedside chair)?: A Lot Help needed to walk in hospital room?: A Lot Help needed climbing 3-5 steps with a railing? : Total 6 Click Score: 11    End of Session Equipment Utilized During Treatment: Gait belt Activity Tolerance: Patient limited by fatigue;Patient limited by pain Patient left: in chair;with call bell/phone within reach;with chair alarm set Nurse Communication: Mobility status PT Visit Diagnosis: Unsteadiness on feet (R26.81);History of falling (Z91.81);Other abnormalities of gait and mobility (R26.89);Muscle weakness (generalized) (M62.81);Difficulty in walking, not elsewhere classified (R26.2);Pain Pain - Right/Left: Right Pain - part of body: Hip     Time:  - 10:29 - 11:27    Charges:   2 gt    1 ta    1 hm                   Rica Koyanagi  PTA Acute  Rehabilitation Services Pager      [phone] Office      [phone]

## 2018-11-17 NOTE — Discharge Summary (Signed)
Physician Discharge Summary  CALLEE ROHRIG NOI:[id number] DOB: 19-Oct-1932 DOA: 11/10/2018  PCP: Kathyrn Lass, MD  Admit date: 11/10/2018 Discharge date: 11/17/2018  Admitted From: home  Disposition:  Home   Recommendations for Outpatient Follow-up:  1. Follow up with PCP in 1-2 weeks 2. Follow-up with orthopedics as scheduled.  Home Health: PT OT RN Equipment/Devices: None  Discharge Condition: Stable CODE STATUS: DNR Diet recommendation: Regular diet  Brief/Interim Summary: Patient is 85 year old female with history of moderate aortic stenosis, hypertension, chronic anemia, breast cancer who was recently treated for right hip fracture.  Patient went home and in 2 days she tripped and fell with recurrence of pain on her right hip.  Patient was found to have rib fracture at the site of recent surgery around the femoral neck.  Patient underwent Removal of intramedullary nail and posterior total hip arthroplasty on the right side on 11/14/2018.  Discharge Diagnoses:  Principal Problem:   Hip fracture ([place]) Active Problems:   Essential hypertension   Moderate aortic stenosis   Iron deficiency anemia due to chronic blood loss   Vitamin B 12 deficiency  Closed traumatic right hip fracture:  Status post removal of IM nail and hip arthroplasty.  Surgically stable as per surgery.  Received 2 units PRBC perioperative.  Adequately pain controlled today.  Worked with PT OT.  Patient and family desire to go home with therapies at home. Discharging with home health PT OT.  Tramadol for pain relief.  Aspirin 325 mg twice daily for DVT prophylaxis. All-time fall precautions.  Chronic anemia is from B12 recency and iron deficiency: She is on supplements.  Hemoglobin has remained stable now.  Hyperlipidemia: On statin.  Recently treated pneumonia: Chest x-ray showed persistent opacity, it may be too early to repeat one.  She will need outpatient follow-up.  COVID-19 ruled out.  Discharge  Instructions  Discharge Instructions    Call MD / Call 911   Complete by:  As directed    If you experience chest pain or shortness of breath, CALL 911 and be transported to the hospital emergency room.  If you develope a fever above 101 F, pus (white drainage) or increased drainage or redness at the wound, or calf pain, call your surgeon's office.   Call MD for:  redness, tenderness, or signs of infection (pain, swelling, redness, odor or green/yellow discharge around incision site)   Complete by:  As directed    Call MD for:  severe uncontrolled pain   Complete by:  As directed    Change dressing   Complete by:  As directed    Change the dressing daily with sterile 4 x 4 inch gauze dressing and paper tape.   Constipation Prevention   Complete by:  As directed    Drink plenty of fluids.  Prune juice may be helpful.  You may use a stool softener, such as Colace (over the counter) 100 mg twice a day.  Use MiraLax (over the counter) for constipation as needed.   Diet - low sodium heart healthy   Complete by:  As directed    Diet - low sodium heart healthy   Complete by:  As directed    Driving restrictions   Complete by:  As directed    No driving for 6 weeks   Follow the hip precautions as taught in Physical Therapy   Complete by:  As directed    Weight bearing as tolerated   Complete by:  As directed  Allergies as of 11/17/2018   No Known Allergies     Medication List    STOP taking these medications   HYDROcodone-acetaminophen 5-325 MG tablet Commonly known as:  NORCO/VICODIN     TAKE these medications   acetaminophen 500 MG tablet Commonly known as:  TYLENOL Take 1,000 mg by mouth every 6 (six) hours as needed (pain).   albuterol 108 (90 Base) MCG/ACT inhaler Commonly known as:  VENTOLIN HFA Inhale 2 puffs into the lungs every 6 (six) hours.   aspirin EC 325 MG tablet Take 1 tablet (325 mg total) by mouth 2 (two) times a day for 21 days. Then take one 81 mg  aspirin once a day for three weeks. Then discontinue aspirin. What changed:  when to take this   atorvastatin 40 MG tablet Commonly known as:  LIPITOR Take 40 mg by mouth daily.   citalopram 20 MG tablet Commonly known as:  CELEXA Take 1 tablet (20 mg total) by mouth daily.   docusate sodium 100 MG capsule Commonly known as:  COLACE Take 1 capsule (100 mg total) by mouth daily. What changed:    when to take this  reasons to take this   ferrous sulfate 325 (65 FE) MG tablet Take 1 tablet (325 mg total) by mouth 2 (two) times daily with a meal. What changed:  when to take this   gabapentin 300 MG capsule Commonly known as:  NEURONTIN Take 1 capsule (300 mg total) by mouth at bedtime.   methocarbamol 500 MG tablet Commonly known as:  ROBAXIN Take 1 tablet (500 mg total) by mouth every 6 (six) hours as needed for muscle spasms.   multivitamin with minerals Tabs tablet Take 1 tablet by mouth daily.   omeprazole 40 MG capsule Commonly known as:  PRILOSEC TAKE 1 CAPSULE (40 MG TOTAL) BY MOUTH 2 (TWO) TIMES DAILY BEFORE A MEAL.   oxyCODONE 5 MG immediate release tablet Commonly known as:  Oxy IR/ROXICODONE Take 0.5-1 tablets (2.5-5 mg total) by mouth every 6 (six) hours as needed for moderate pain.   phenol 1.4 % Liqd Commonly known as:  CHLORASEPTIC Use as directed 1 spray in the mouth or throat as needed for throat irritation / pain.   traMADol 50 MG tablet Commonly known as:  ULTRAM Take 100 mg by mouth 4 (four) times daily. What changed:  Another medication with the same name was added. Make sure you understand how and when to take each.   traMADol 50 MG tablet Commonly known as:  ULTRAM Take 2 tablets (100 mg total) by mouth 4 (four) times daily. What changed:  You were already taking a medication with the same name, and this prescription was added. Make sure you understand how and when to take each.   VITAMIN B-12 PO Take 1 tablet by mouth daily.   Vitamin D  (Ergocalciferol) 1.25 MG (50000 UT) Caps capsule Commonly known as:  DRISDOL Take 50,000 Units by mouth every Thursday.            Discharge Care Instructions  (From admission, onward)         Start     Ordered   11/15/18 0000  Weight bearing as tolerated     11/15/18 0717   11/15/18 0000  Change dressing    Comments:  Change the dressing daily with sterile 4 x 4 inch gauze dressing and paper tape.   11/15/18 0717         Follow-up Information  Gaynelle Arabian, MD. Schedule an appointment as soon as possible for a visit on 11/29/2018.   Specialty:  Orthopedic Surgery Contact information: [address] [place] [address] [place] [address] [phone]          No Known Allergies  Consultations:  Orthopedics    Procedures/Studies: Dg Chest 1 View  Result Date: 10/24/2018 CLINICAL DATA:  85 year old female with a fall and hip pain EXAM: CHEST  1 VIEW COMPARISON:  11/19/2016 FINDINGS: Cardiomediastinal silhouette unchanged in size and contour. Low lung volumes with linear opacities at the lung bases. No pneumothorax or pleural effusion. No confluent airspace disease. Changes of prior left shoulder arthroplasty. Degenerative changes of the right shoulder. IMPRESSION: Chronic changes without evidence of acute cardiopulmonary disease Electronically Signed   By: Corrie Mckusick D.O.   On: 10/24/2018 16:37   Dg Pelvis 1-2 Views  Result Date: 11/11/2018 CLINICAL DATA:  Fall with hip pain EXAM: PELVIS - 1-2 VIEW COMPARISON:  10/31/2018, 10/24/2018, fluoroscopic images 422 1,020 FINDINGS: Residual contrast within the rectosigmoid colon. Interval intramedullary rodding of right femur fracture. Interval change in position of the obliquely oriented hardware with tip projecting beyond the cortical margin of the right femoral head. The base of femoral neck fracture appears more displaced. No femoral head dislocation. Vascular calcifications. IMPRESSION: Status post intramedullary  rodding of proximal right femur fracture but with interval change in appearance of the fixating hardware, obliquely oriented hardware projects over the right hip joint, above the cortical margin of the right femoral head. Base of femoral neck fracture appears more displaced compared with intraoperative views. Electronically Signed   By: Donavan Foil M.D.   On: 11/11/2018 00:00   Dg Tibia/fibula Right  Result Date: 11/11/2018 CLINICAL DATA:  Fall EXAM: RIGHT TIBIA AND FIBULA - 2 VIEW COMPARISON:  11/10/2018 knee radiograph FINDINGS: No acute displaced fracture or malalignment. Soft tissues are unremarkable. IMPRESSION: No acute osseous abnormality Electronically Signed   By: Donavan Foil M.D.   On: 11/11/2018 00:01   Ct Head Wo Contrast  Result Date: 11/11/2018 CLINICAL DATA:  Fall EXAM: CT HEAD WITHOUT CONTRAST TECHNIQUE: Contiguous axial images were obtained from the base of the skull through the vertex without intravenous contrast. COMPARISON:  10/24/2018 FINDINGS: Brain: There is atrophy and chronic small vessel disease changes. No acute intracranial abnormality. Specifically, no hemorrhage, hydrocephalus, mass lesion, acute infarction, or significant intracranial injury. Vascular: No hyperdense vessel or unexpected calcification. Skull: No acute calvarial abnormality. Sinuses/Orbits: No acute findings Other: None IMPRESSION: Atrophy, chronic microvascular disease. No acute intracranial abnormality. Electronically Signed   By: Rolm Baptise M.D.   On: 11/11/2018 00:09   Ct Head Wo Contrast  Result Date: 10/24/2018 CLINICAL DATA:  Fall 3-4 days ago.  Hit right-side of head. EXAM: CT HEAD WITHOUT CONTRAST TECHNIQUE: Contiguous axial images were obtained from the base of the skull through the vertex without intravenous contrast. COMPARISON:  CT head dated August 21, 2015. FINDINGS: Brain: No evidence of acute infarction, hemorrhage, hydrocephalus, extra-axial collection or mass lesion/mass effect.  Stable moderate atrophy and chronic microvascular ischemic changes. Vascular: Calcified atherosclerosis at the skullbase. No hyperdense vessel. Skull: Negative for fracture or focal lesion. Old left zygomatic arch fracture. Sinuses/Orbits: No acute finding. Other: None. IMPRESSION: 1. No acute intracranial abnormality. Stable moderate atrophy and chronic microvascular ischemic changes. Electronically Signed   By: Titus Dubin M.D.   On: 10/24/2018 17:49   Ct Angio Chest Pe W/cm &/or Wo Cm  Result Date: 10/29/2018 CLINICAL DATA:  Short  of breath and hypoxia.  Recent hip surgery. EXAM: CT ANGIOGRAPHY CHEST WITH CONTRAST TECHNIQUE: Multidetector CT imaging of the chest was performed using the standard protocol during bolus administration of intravenous contrast. Multiplanar CT image reconstructions and MIPs were obtained to evaluate the vascular anatomy. CONTRAST:  57mL OMNIPAQUE IOHEXOL 350 MG/ML SOLN COMPARISON:  PET-CT 01/04/2012 FINDINGS: Cardiovascular: There are no filling defects in the pulmonary arterial tree to suggest acute pulmonary thromboembolism. There is no obvious evidence of aortic aneurysm or aortic dissection. Atherosclerotic vascular calcifications are noted. Mediastinum/Nodes: No abnormal mediastinal adenopathy. No pericardial effusion. Visualized thyroid is unremarkable. The distal esophagus is prominent. On image 98, a large soft tissue density is located in the location of the esophagus. Underlying mass is not excluded. Lungs/Pleura: There is a patchy opacity in the anterior left upper lobe measuring up to 1.8 cm. There are linear opacities extending into the adjacent lung parenchyma. There are lucencies within the patchy the opacity. There is occlusion of small bronchioles in the medial right lung base. There are patchy opacities at the left lung base on images 72 and 76 of series 6. Dependent atelectasis bilaterally. Bilateral lower lobe bronchial wall thickening. Upper Abdomen: No  acute abnormality. Musculoskeletal: No vertebral compression deformity. Review of the MIP images confirms the above findings. IMPRESSION: There is no evidence of acute pulmonary thromboembolism The distal esophagus is prominent. Underlying mass is not excluded. Consider esophagram or upper endoscopy to further characterize. There is an ill-defined patchy opacity in the left upper lobe measuring up to 1.8 cm. There are also patchy opacities at the left base. These findings may reflect an inflammatory process at the lung base and post radiation fibrosis in the left upper lobe. Underlying malignancy is not excluded. Follow-up CT chest in 3 months to ensure resolution. Bilateral bronchial wall thickening and opacification of right basilar bronchials. These findings are compatible with mucoid impaction and an inflammatory process. Aortic Atherosclerosis (ICD10-I70.0). Electronically Signed   By: Marybelle Killings M.D.   On: 10/29/2018 17:22   Ct Knee Right Wo Contrast  Result Date: 11/11/2018 CLINICAL DATA:  Fall.  Right knee pain EXAM: CT OF THE RIGHT KNEE WITHOUT CONTRAST TECHNIQUE: Multidetector CT imaging of the right knee was performed according to the standard protocol. Multiplanar CT image reconstructions were also generated. COMPARISON:  Plain films earlier today FINDINGS: Advanced tricompartment degenerative changes within the right knee with joint space loss and extensive spurring. Chondrocalcinosis noted. Small to moderate joint effusion within the right knee. No acute bony abnormality. No fracture, subluxation or dislocation. IMPRESSION: Advanced tricompartment osteoarthritis with small to moderate joint effusion. No acute bony abnormality. Electronically Signed   By: Rolm Baptise M.D.   On: 11/11/2018 03:15   Dg Pelvis Portable  Result Date: 11/14/2018 CLINICAL DATA:  Right hip arthroplasty. EXAM: PORTABLE PELVIS 1-2 VIEWS COMPARISON:  CT right hip dated Nov 11, 2018. FINDINGS: The right hip demonstrates a  total arthroplasty without evidence of hardware failure or complication. There is expected intra-articular air. There is no fracture or dislocation. The alignment is anatomic. Screw tracks from prior femoral neck and distal interlocking screws are noted. Post-surgical changes noted in the surrounding soft tissues with surgical drain in place. Vascular calcifications. IMPRESSION: 1. Interval right total hip arthroplasty without evidence of acute postoperative complication. Electronically Signed   By: Titus Dubin M.D.   On: 11/14/2018 14:33   Ct Hip Right Wo Contrast  Result Date: 11/11/2018 CLINICAL DATA:  Fall, hip trauma. Change in hip hardware  since prior study. EXAM: CT OF THE RIGHT HIP WITHOUT CONTRAST TECHNIQUE: Multidetector CT imaging of the right hip was performed according to the standard protocol. Multiplanar CT image reconstructions were also generated. COMPARISON:  Plain-film tonight and intraoperative imaging from 10/24/2018 FINDINGS: There is a comminuted right proximal femoral fracture noted involving the femoral neck and lesser trochanter. Since prior prior intraoperative imaging, there is increasing displacement and varus angulation. The femoral neck/head screw now passes through the femoral head and into the superior acetabular wall. Contrast within the sigmoid colon. No free fluid or free air. Urinary bladder unremarkable. IMPRESSION: Change in the appearance of the right femoral neck fracture and hardware. Increasing varus angulation and displacement of fracture fragments, compatible with re-injury. The femoral neck/head screw now passes through the femoral head and into the superior acetabular fall. Electronically Signed   By: Rolm Baptise M.D.   On: 11/11/2018 03:14   Dg Chest Port 1 View  Result Date: 11/11/2018 CLINICAL DATA:  85 y/o  F; preop chest radiograph. EXAM: PORTABLE CHEST 1 VIEW COMPARISON:  10/29/2018 CT chest and chest radiograph. FINDINGS: Normal cardiac silhouette.  Aortic atherosclerosis with calcification. Stable ill-defined left upper lobe opacity better characterized on prior CT chest. No new consolidation. No pleural effusion or pneumothorax. Severe osteoarthrosis of the right shoulder joint with loss of the acromial humeral distance. Left shoulder arthroplasty partially visualized. IMPRESSION: No new acute pulmonary process identified. Stable ill-defined left upper lobe opacity, follow-up as per prior CT chest. Electronically Signed   By: Kristine Garbe M.D.   On: 11/11/2018 01:25   Dg Chest Port 1 View  Result Date: 10/29/2018 CLINICAL DATA:  Short of breath EXAM: PORTABLE CHEST 1 VIEW COMPARISON:  10/24/2018 FINDINGS: Normal heart size. Lungs are under aerated. Hazy left perihilar pulmonary opacities. No pneumothorax or pleural effusion. IMPRESSION: Hazy left perihilar airspace opacities. Followup PA and lateral chest X-ray is recommended in 3-4 weeks following trial of antibiotic therapy to ensure resolution and exclude underlying malignancy. Electronically Signed   By: Marybelle Killings M.D.   On: 10/29/2018 13:47   Dg Knee Complete 4 Views Right  Result Date: 11/10/2018 CLINICAL DATA:  Fall EXAM: RIGHT KNEE - COMPLETE 4+ VIEW COMPARISON:  10/24/2018 FINDINGS: No fracture or malalignment. Advanced patellofemoral degenerative changes with small knee effusion. Advanced arthritis involving the medial and lateral joint space compartments. Chondrocalcinosis. IMPRESSION: 1. No acute osseous abnormality. 2. Advanced tricompartment arthritis with chondrocalcinosis and small knee effusion Electronically Signed   By: Donavan Foil M.D.   On: 11/10/2018 19:38   Dg Swallowing Func-speech Pathology  Result Date: 10/31/2018 Objective Swallowing Evaluation: Type of Study: MBS-Modified Barium Swallow Study  Patient Details Name: LUCILLIE KIESEL MRN: [id number] Date of Birth: 1933-03-12 Today's Date: 10/31/2018 Time: SLP Start Time (ACUTE ONLY): 1200 -SLP Stop Time (ACUTE  ONLY): 1212 SLP Time Calculation (min) (ACUTE ONLY): 12 min Past Medical History: Past Medical History: Diagnosis Date . Anemia  . Arthritis   shoulders . Asthma   mild, no inhalers used . Breast cancer ([place]) 12/18/11  left breast masectomy=metastatic ca in (1/1) lymph node ,invasive ductal ca,2 foci,,dcis,lymph ovascular invasion identified,surgical resection margins neg for ca,additional tissue=benign skin and subcutaneous tissue . Bronchitis   hx of;last time >83yr ago . Depression   takes Celexa daily . Difficult intubation  . Dysphagia  . Full dentures  . Gall stones 2015 . GERD (gastroesophageal reflux disease)   takes Prilosec prn . H/O hiatal hernia  . History of kidney stones  many yrs ago . Hx of radiation therapy 04/26/12 -06/10/12  left breast . Hyperlipidemia   takes Simvastatin daily . Hypertension   takes Amlodipine and Diovan daily . Insomnia   takes Ambien nightly . Urinary urgency  Past Surgical History: Past Surgical History: Procedure Laterality Date . APPENDECTOMY   . bladder tack   . BREAST BIOPSY  1998  left  . DILATION AND CURETTAGE OF UTERUS   . ENDOSCOPIC RETROGRADE CHOLANGIOPANCREATOGRAPHY (ERCP) WITH PROPOFOL N/A 02/01/2014  Procedure: ENDOSCOPIC RETROGRADE CHOLANGIOPANCREATOGRAPHY (ERCP) WITH PROPOFOL;  Surgeon: [name], MD;  Location: [facility];  Service: Endoscopy;  Laterality: N/A; . ESOPHAGOGASTRODUODENOSCOPY   . ESOPHAGOGASTRODUODENOSCOPY N/A 06/26/2016  Procedure: ESOPHAGOGASTRODUODENOSCOPY (EGD);  Surgeon: [name], MD;  Location: [place];  Service: Endoscopy;  Laterality: N/A; . ESOPHAGOGASTRODUODENOSCOPY (EGD) WITH PROPOFOL  12/19/2013  Procedure: ESOPHAGOGASTRODUODENOSCOPY (EGD) WITH PROPOFOL;  Surgeon: Beryle Beams, MD;  Location: [place];  Service: Endoscopy;; . ESOPHAGOGASTRODUODENOSCOPY (EGD) WITH PROPOFOL N/A 09/10/2016  Procedure: ESOPHAGOGASTRODUODENOSCOPY (EGD) WITH PROPOFOL;  Surgeon: [name], MD;  Location: [facility];  Service:  Endoscopy;  Laterality: N/A; . EUS  06/04/2011  Procedure: UPPER ENDOSCOPIC ULTRASOUND (EUS) LINEAR;  Surgeon: Owens Loffler, MD;  Location: [facility];  Service: Endoscopy;  Laterality: N/A; . EUS N/A 02/01/2014  Procedure: UPPER ENDOSCOPIC ULTRASOUND (EUS) LINEAR;  Surgeon: [name], MD;  Location: [facility];  Service: Endoscopy;  Laterality: N/A; . EXPLORATORY LAPAROTOMY     biopsy of intra-abdominal mass . INTRAMEDULLARY (IM) NAIL INTERTROCHANTERIC Right 10/24/2018  Procedure: INTRAMEDULLARY (IM) NAIL INTERTROCHANTRIC;  Surgeon: Gaynelle Arabian, MD;  Location: [facility];  Service: Orthopedics;  Laterality: Right; . LAPAROTOMY N/A 11/17/2016  Procedure: EXPLORATORY LAPAROTOMY WITH LYSIS OF ADHESIONS, SMALL BOWEL RESECTION, REPAIR OF INCARCERATED VENTRAL INCISIONAL HERNIA, INSERTION OF BIOLOGIC MESH PATCH,APPLICATION OF WOUND VAC DRESSING;  Surgeon: Armandina Gemma, MD;  Location: [facility];  Service: General;  Laterality: N/A; . MASTECTOMY W/ SENTINEL NODE BIOPSY  12/18/2011  Procedure: MASTECTOMY WITH SENTINEL LYMPH NODE BIOPSY;  Surgeon: Rolm Bookbinder, MD;  Location: [place];  Service: General;  Laterality: Left; . PORT-A-CATH REMOVAL Right 06/15/2013  Procedure: REMOVAL PORT-A-CATH;  Surgeon: Rolm Bookbinder, MD;  Location: [place];  Service: General;  Laterality: Right; . PORTACATH PLACEMENT  01/27/2012  Procedure: INSERTION PORT-A-CATH;  Surgeon: Rolm Bookbinder, MD;  Location: [place];  Service: General;  Laterality: Right;  PORT PLACEMENT . TOTAL SHOULDER REPLACEMENT  2011  left . TUBAL LIGATION   HPI: 85 y.o. female with medical history significant of HTN; HLD; mild dementia; mild-moderate AS; and breast CA presenting with SOB.  She was admitted with a hip fracture from 4/20-22 and discharged home with daughter. CT of chest 4/25 signficant for "the distal esophagus is prominent. Underlying mass is not excluded, ill-defined patchy opacity in the left upper lobe   Subjective: alert upright in chair for procedure Assessment / Plan / Recommendation CHL IP CLINICAL IMPRESSIONS 10/31/2018 Clinical Impression A mild oropharyngeal dysphagia was exhibited. Oral deficits c/b prolonged mastication of solid PO in setting of edentulism and delayed oral transit with mild oral residuals. Pharyngeal deficits c/b decreased base of tongue retraction, decreased laryngeal elevation, reduced UES opening, and decreased laryngeal vestibule closure resulting in: one instance of transient penetration of thin liquids via straw sip. No aspiration was evidenced. Mild vallecular and pyriform sinus residuals were exhibited that cleared wtih a second volitional swallow.  SLP Visit Diagnosis Dysphagia, oropharyngeal phase (R13.12) Attention and concentration deficit following -- Frontal  lobe and executive function deficit following -- Impact on safety and function Mild aspiration risk   CHL IP TREATMENT RECOMMENDATION 10/31/2018 Treatment Recommendations Therapy as outlined in treatment plan below   Prognosis 10/31/2018 Prognosis for Safe Diet Advancement Good Barriers to Reach Goals -- Barriers/Prognosis Comment -- CHL IP DIET RECOMMENDATION 10/31/2018 SLP Diet Recommendations Dysphagia 3 (Mech soft) solids;Thin liquid Liquid Administration via Cup Medication Administration Whole meds with puree Compensations Minimize environmental distractions;Slow rate;Small sips/bites Postural Changes Remain semi-upright after after feeds/meals (Comment);Seated upright at 90 degrees   CHL IP OTHER RECOMMENDATIONS 10/31/2018 Recommended Consults -- Oral Care Recommendations Oral care BID Other Recommendations --   CHL IP FOLLOW UP RECOMMENDATIONS 10/31/2018 Follow up Recommendations 24 hour supervision/assistance   CHL IP FREQUENCY AND DURATION 10/31/2018 Speech Therapy Frequency (ACUTE ONLY) min 1 x/week Treatment Duration 1 week      CHL IP ORAL PHASE 10/31/2018 Oral Phase Impaired Oral - Pudding Teaspoon -- Oral - Pudding  Cup -- Oral - Honey Teaspoon -- Oral - Honey Cup -- Oral - Nectar Teaspoon -- Oral - Nectar Cup Delayed oral transit Oral - Nectar Straw -- Oral - Thin Teaspoon -- Oral - Thin Cup WFL Oral - Thin Straw WFL Oral - Puree Delayed oral transit Oral - Mech Soft -- Oral - Regular Delayed oral transit;Lingual/palatal residue Oral - Multi-Consistency -- Oral - Pill Delayed oral transit Oral Phase - Comment --  CHL IP PHARYNGEAL PHASE 10/31/2018 Pharyngeal Phase Impaired Pharyngeal- Pudding Teaspoon -- Pharyngeal -- Pharyngeal- Pudding Cup -- Pharyngeal -- Pharyngeal- Honey Teaspoon -- Pharyngeal -- Pharyngeal- Honey Cup -- Pharyngeal -- Pharyngeal- Nectar Teaspoon -- Pharyngeal -- Pharyngeal- Nectar Cup Reduced tongue base retraction;Pharyngeal residue - valleculae Pharyngeal Material does not enter airway Pharyngeal- Nectar Straw -- Pharyngeal -- Pharyngeal- Thin Teaspoon -- Pharyngeal -- Pharyngeal- Thin Cup Delayed swallow initiation-pyriform sinuses;Reduced tongue base retraction;Reduced anterior laryngeal mobility;Pharyngeal residue - pyriform;Pharyngeal residue - valleculae Pharyngeal Material does not enter airway Pharyngeal- Thin Straw Penetration/Aspiration before swallow;Reduced epiglottic inversion;Pharyngeal residue - valleculae;Pharyngeal residue - pyriform Pharyngeal Material enters airway, remains ABOVE vocal cords then ejected out Pharyngeal- Puree Reduced tongue base retraction;Pharyngeal residue - valleculae Pharyngeal Material does not enter airway Pharyngeal- Mechanical Soft -- Pharyngeal -- Pharyngeal- Regular Pharyngeal residue - valleculae;Reduced tongue base retraction Pharyngeal Material does not enter airway Pharyngeal- Multi-consistency -- Pharyngeal -- Pharyngeal- Pill Delayed swallow initiation-pyriform sinuses;Pharyngeal residue - valleculae Pharyngeal Material does not enter airway Pharyngeal Comment --  CHL IP CERVICAL ESOPHAGEAL PHASE 10/31/2018 Cervical Esophageal Phase Impaired Pudding  Teaspoon -- Pudding Cup -- Honey Teaspoon -- Honey Cup -- Nectar Teaspoon -- Nectar Cup Reduced cricopharyngeal relaxation Nectar Straw -- Thin Teaspoon -- Thin Cup Reduced cricopharyngeal relaxation Thin Straw -- Puree Reduced cricopharyngeal relaxation Mechanical Soft Reduced cricopharyngeal relaxation Regular Reduced cricopharyngeal relaxation Multi-consistency -- Pill Reduced cricopharyngeal relaxation Cervical Esophageal Comment -- Chelsea E Hartness MA, CCC-SLP 10/31/2018, 1:04 PM              Dg C-arm 1-60 Min-no Report  Result Date: 10/24/2018 Fluoroscopy was utilized by the requesting physician.  No radiographic interpretation.   Dg Hip Operative Unilat W Or W/o Pelvis Right  Result Date: 10/24/2018 CLINICAL DATA:  Right IM nail. EXAM: OPERATIVE RIGHT HIP (WITH PELVIS IF PERFORMED) 5 VIEWS TECHNIQUE: Fluoroscopic spot image(s) were submitted for interpretation post-operatively. COMPARISON:  10/24/2018 FINDINGS: Multiple intraoperative spot images demonstrate internal fixation with IM nail across the right femoral neck fracture. Anatomic alignment. No hardware complicating feature. IMPRESSION: Internal fixation.  No  complicating feature. Electronically Signed   By: Rolm Baptise M.D.   On: 10/24/2018 22:54   Dg Hip Unilat W Or Wo Pelvis 2-3 Views Right  Result Date: 10/24/2018 CLINICAL DATA:  85 year old who fell 3 days ago and complains of persistent RIGHT hip and RIGHT UPPER leg pain. Initial encounter. EXAM: DG HIP (WITH OR WITHOUT PELVIS) 2-3V RIGHT COMPARISON:  Bone window images from CT abdomen and pelvis 11/15/2016. FINDINGS: Acute comminuted intertrochanteric RIGHT femoral neck fracture. RIGHT hip joint anatomically aligned with minimal to mild joint space narrowing. Symmetric minimal to mild joint space narrowing in the contralateral LEFT hip. Sacroiliac joints and symphysis pubis anatomically aligned without diastasis and demonstrating degenerative changes. Osseous demineralization.  Degenerative changes involving the visualized LOWER lumbar spine. IMPRESSION: Acute comminuted intertrochanteric RIGHT femoral neck fracture. Electronically Signed   By: Evangeline Dakin M.D.   On: 10/24/2018 16:52   Dg Femur Min 2 Views Right  Result Date: 11/11/2018 CLINICAL DATA:  Femur fracture EXAM: RIGHT FEMUR 2 VIEWS COMPARISON:  10/24/2018 FINDINGS: Intramedullary rod and distal screw fixation of right proximal femur fracture with rig tip of the right femoral neck hardware component now projecting beyond cortical margin of the right femoral head. Femoral neck fracture appears more displaced. Mid to distal femur appear intact. IMPRESSION: 1. No acute osseous abnormality of the mid to distal femur. 2. Status post internal fixation of right proximal femur fracture, now with cortical breach of the femoral head by fixating hardware Electronically Signed   By: Donavan Foil M.D.   On: 11/11/2018 00:03   Dg Femur Min 2 Views Right  Result Date: 10/24/2018 CLINICAL DATA:  85 year old who fell 3 days ago and complains of persistent RIGHT hip and RIGHT UPPER leg pain. Initial encounter. EXAM: RIGHT FEMUR 2 VIEWS COMPARISON:  None. These images are read in conjunction with the RIGHT hip x-rays obtained concurrently. FINDINGS: The intertrochanteric RIGHT femoral neck fracture is not included on these images. No fractures elsewhere involving the RIGHT femur. Osseous demineralization. Severe tricompartment osteoarthritis involving the knee. IMPRESSION: 1. No fractures elsewhere involving the RIGHT femur. 2. Osseous demineralization. 3. Severe tricompartment osteoarthritis involving the knee. Electronically Signed   By: Evangeline Dakin M.D.   On: 10/24/2018 16:53   Vas [place] Lower Extremity Venous (dvt)  Result Date: 10/31/2018  Lower Venous Study Indications: Edema.  Performing Technologist: Oliver Hum RVT  Examination Guidelines: A complete evaluation includes B-mode imaging, spectral Doppler, color  Doppler, and power Doppler as needed of all accessible portions of each vessel. Bilateral testing is considered an integral part of a complete examination. Limited examinations for reoccurring indications may be performed as noted.  +---------+---------------+---------+-----------+----------+-------+ RIGHT    CompressibilityPhasicitySpontaneityPropertiesSummary +---------+---------------+---------+-----------+----------+-------+ CFV      Full           Yes      Yes                          +---------+---------------+---------+-----------+----------+-------+ SFJ      Full                                                 +---------+---------------+---------+-----------+----------+-------+ FV Prox  Full                                                 +---------+---------------+---------+-----------+----------+-------+  FV Mid   Full                                                 +---------+---------------+---------+-----------+----------+-------+ FV DistalFull                                                 +---------+---------------+---------+-----------+----------+-------+ PFV      Full                                                 +---------+---------------+---------+-----------+----------+-------+ POP      Full           Yes      Yes                          +---------+---------------+---------+-----------+----------+-------+ PTV      Full                                                 +---------+---------------+---------+-----------+----------+-------+ PERO     Full                                                 +---------+---------------+---------+-----------+----------+-------+   +---------+---------------+---------+-----------+----------+-------+ LEFT     CompressibilityPhasicitySpontaneityPropertiesSummary +---------+---------------+---------+-----------+----------+-------+ CFV      Full           Yes      Yes                           +---------+---------------+---------+-----------+----------+-------+ SFJ      Full                                                 +---------+---------------+---------+-----------+----------+-------+ FV Prox  Full                                                 +---------+---------------+---------+-----------+----------+-------+ FV Mid   Full                                                 +---------+---------------+---------+-----------+----------+-------+ FV DistalFull                                                 +---------+---------------+---------+-----------+----------+-------+ PFV  Full                                                 +---------+---------------+---------+-----------+----------+-------+ POP      Full           Yes      Yes                          +---------+---------------+---------+-----------+----------+-------+ PTV      Full                                                 +---------+---------------+---------+-----------+----------+-------+ PERO     Full                                                 +---------+---------------+---------+-----------+----------+-------+     Summary: Right: There is no evidence of deep vein thrombosis in the lower extremity. No cystic structure found in the popliteal fossa. Left: There is no evidence of deep vein thrombosis in the lower extremity. No cystic structure found in the popliteal fossa.  *See table(s) above for measurements and observations. Electronically signed by Ruta Hinds MD on 10/31/2018 at 5:49:55 PM.    Final    Dg Esophagus W Single Cm (sol Or Thin Ba)  Result Date: 10/31/2018 CLINICAL DATA:  Difficulty swallowing. EXAM: ESOPHOGRAM/BARIUM SWALLOW TECHNIQUE: Single contrast examination was performed using  thin barium. FLUOROSCOPY TIME:  Fluoroscopy Time:  1 minutes and 6 seconds. Radiation Exposure Index (if provided by the fluoroscopic device): 28.1 mGy Number of  Acquired Spot Images: COMPARISON:  CT scan 10/29/2018 FINDINGS: Evaluation limited by immobility and inability to tolerate upright positioning. No evidence for esophageal mass lesion, diverticulum, stricture, or gross ulceration. Disruption of primary peristalsis is compatible with nonspecific esophageal motility disorder. Tertiary contractions are observed in the mid and distal esophagus. Small hiatal hernia noted. No evidence for mass lesion in the distal esophagus. IMPRESSION: 1. No gross esophageal abnormality on this limited single contrast study. Specifically, no evidence for mass lesion in the distal esophagus. 2. Nonspecific esophageal motility disorder. 3. Small hiatal hernia. Electronically Signed   By: Misty Stanley M.D.   On: 10/31/2018 13:34      Subjective: Patient is seen and examined at the bedside.  She says he needed some help to get out of the bed otherwise she could walk with a walker.  Pain is controlled with tramadol.  No pain at rest.  Eager to go home with her daughter.   Discharge Exam: Vitals:   11/17/18 0804 11/17/18 0921  BP:  (!) 119/58  Pulse:  90  Resp:  16  Temp:  97.6 F (36.4 C)  SpO2: 94% 94%   Vitals:   11/17/18 0010 11/17/18 0527 11/17/18 0804 11/17/18 0921  BP: (!) 110/49 (!) 127/55  (!) 119/58  Pulse: 79 80  90  Resp: 17 18  16   Temp: 99.1 F (37.3 C) 99 F (37.2 C)  97.6 F (36.4 C)  TempSrc: Oral Oral  Oral  SpO2: 95% 96% 94% 94%  Weight:  Height:        General: Pt is alert, awake, not in acute distress Cardiovascular: RRR, S1/S2 +, no rubs, no gallops Respiratory: CTA bilaterally, no wheezing, no rhonchi Abdominal: Soft, NT, ND, bowel sounds + Extremities: no edema, no cyanosis Right lateral thigh incision clean and dry.    The results of significant diagnostics from this hospitalization (including imaging, microbiology, ancillary and laboratory) are listed below for reference.     Microbiology: Recent Results (from the past  240 hour(s))  SARS Coronavirus 2 (CEPHEID - Performed in [place] hospital lab), Hosp Order     Status: None   Collection Time: 11/11/18  1:10 AM  Result Value Ref Range Status   SARS Coronavirus 2 NEGATIVE NEGATIVE Final    Comment: (NOTE) If result is NEGATIVE SARS-CoV-2 target nucleic acids are NOT DETECTED. The SARS-CoV-2 RNA is generally detectable in upper and lower  respiratory specimens during the acute phase of infection. The lowest  concentration of SARS-CoV-2 viral copies this assay can detect is 250  copies / mL. A negative result does not preclude SARS-CoV-2 infection  and should not be used as the sole basis for treatment or other  patient management decisions.  A negative result may occur with  improper specimen collection / handling, submission of specimen other  than nasopharyngeal swab, presence of viral mutation(s) within the  areas targeted by this assay, and inadequate number of viral copies  (<250 copies / mL). A negative result must be combined with clinical  observations, patient history, and epidemiological information. If result is POSITIVE SARS-CoV-2 target nucleic acids are DETECTED. The SARS-CoV-2 RNA is generally detectable in upper and lower  respiratory specimens dur ing the acute phase of infection.  Positive  results are indicative of active infection with SARS-CoV-2.  Clinical  correlation with patient history and other diagnostic information is  necessary to determine patient infection status.  Positive results do  not rule out bacterial infection or co-infection with other viruses. If result is PRESUMPTIVE POSTIVE SARS-CoV-2 nucleic acids MAY BE PRESENT.   A presumptive positive result was obtained on the submitted specimen  and confirmed on repeat testing.  While 2019 novel coronavirus  (SARS-CoV-2) nucleic acids may be present in the submitted sample  additional confirmatory testing may be necessary for epidemiological  and / or clinical  management purposes  to differentiate between  SARS-CoV-2 and other Sarbecovirus currently known to infect humans.  If clinically indicated additional testing with an alternate test  methodology [phone]) is advised. The SARS-CoV-2 RNA is generally  detectable in upper and lower respiratory sp ecimens during the acute  phase of infection. The expected result is Negative. Fact Sheet for Patients:  StrictlyIdeas.no Fact Sheet for Healthcare Providers: BankingDealers.co.za This test is not yet approved or cleared by the [place] FDA and has been authorized for detection and/or diagnosis of SARS-CoV-2 by FDA under an Emergency Use Authorization (EUA).  This EUA will remain in effect (meaning this test can be used) for the duration of the COVID-19 declaration under Section 564(b)(1) of the Act, 21 U.S.C. section 360bbb-3(b)(1), unless the authorization is terminated or revoked sooner. Performed at [facility], [place] [address], [place], [place] [address]      Labs: BNP (last 3 results) No results for input(s): BNP in the last 8760 hours. Basic Metabolic Panel: Recent Labs  Lab 11/11/18 0150 11/15/18 0547 11/16/18 0604  NA 141 138 136  K 4.0 4.8 4.7  CL 108 106 104  CO2 22 22 25   GLUCOSE 121* 136* 107*  BUN 20 15 16   CREATININE 0.86 0.73 0.60  CALCIUM 9.2 8.6* 8.8*   Liver Function Tests: No results for input(s): AST, ALT, ALKPHOS, BILITOT, PROT, ALBUMIN in the last 168 hours. No results for input(s): LIPASE, AMYLASE in the last 168 hours. No results for input(s): AMMONIA in the last 168 hours. CBC: Recent Labs  Lab 11/11/18 0150  11/13/18 0535 11/14/18 0412 11/15/18 0547 11/16/18 0604 11/17/18 0524  WBC 9.9   < > 5.9 6.9 10.2 10.8* 9.4  NEUTROABS 7.6  --   --   --   --   --   --   HGB 8.6*   < > 8.6* 8.5* 11.1* 9.9* 9.2*  HCT 28.5*   < > 28.3* 28.1* 34.3* 31.0* 29.7*  MCV 98.3   < > 96.3 100.4* 93.5 94.5 97.7   PLT 330   < > 209 347 303 255 249   < > = values in this interval not displayed.   Cardiac Enzymes: No results for input(s): CKTOTAL, CKMB, CKMBINDEX, TROPONINI in the last 168 hours. BNP: Invalid input(s): POCBNP CBG: No results for input(s): GLUCAP in the last 168 hours. D-Dimer No results for input(s): DDIMER in the last 72 hours. Hgb A1c No results for input(s): HGBA1C in the last 72 hours. Lipid Profile No results for input(s): CHOL, HDL, LDLCALC, TRIG, CHOLHDL, LDLDIRECT in the last 72 hours. Thyroid function studies No results for input(s): TSH, T4TOTAL, T3FREE, THYROIDAB in the last 72 hours.  Invalid input(s): FREET3 Anemia work up No results for input(s): VITAMINB12, FOLATE, FERRITIN, TIBC, IRON, RETICCTPCT in the last 72 hours. Urinalysis    Component Value Date/Time   COLORURINE YELLOW 11/11/2018 0218   APPEARANCEUR CLEAR 11/11/2018 0218   LABSPEC 1.020 11/11/2018 0218   PHURINE 5.0 11/11/2018 0218   GLUCOSEU NEGATIVE 11/11/2018 0218   HGBUR NEGATIVE 11/11/2018 0218   BILIRUBINUR NEGATIVE 11/11/2018 0218   KETONESUR NEGATIVE 11/11/2018 0218   PROTEINUR NEGATIVE 11/11/2018 0218   UROBILINOGEN 1.0 10/10/2009 1100   NITRITE NEGATIVE 11/11/2018 0218   LEUKOCYTESUR NEGATIVE 11/11/2018 0218   Sepsis Labs Invalid input(s): PROCALCITONIN,  WBC,  LACTICIDVEN Microbiology Recent Results (from the past 240 hour(s))  SARS Coronavirus 2 (CEPHEID - Performed in [place] hospital lab), Hosp Order     Status: None   Collection Time: 11/11/18  1:10 AM  Result Value Ref Range Status   SARS Coronavirus 2 NEGATIVE NEGATIVE Final    Comment: (NOTE) If result is NEGATIVE SARS-CoV-2 target nucleic acids are NOT DETECTED. The SARS-CoV-2 RNA is generally detectable in upper and lower  respiratory specimens during the acute phase of infection. The lowest  concentration of SARS-CoV-2 viral copies this assay can detect is 250  copies / mL. A negative result does not preclude  SARS-CoV-2 infection  and should not be used as the sole basis for treatment or other  patient management decisions.  A negative result may occur with  improper specimen collection / handling, submission of specimen other  than nasopharyngeal swab, presence of viral mutation(s) within the  areas targeted by this assay, and inadequate number of viral copies  (<250 copies / mL). A negative result must be combined with clinical  observations, patient history, and epidemiological information. If result is POSITIVE SARS-CoV-2 target nucleic acids are DETECTED. The SARS-CoV-2 RNA is generally detectable in upper and lower  respiratory specimens dur ing the acute phase of infection.  Positive  results are  indicative of active infection with SARS-CoV-2.  Clinical  correlation with patient history and other diagnostic information is  necessary to determine patient infection status.  Positive results do  not rule out bacterial infection or co-infection with other viruses. If result is PRESUMPTIVE POSTIVE SARS-CoV-2 nucleic acids MAY BE PRESENT.   A presumptive positive result was obtained on the submitted specimen  and confirmed on repeat testing.  While 2019 novel coronavirus  (SARS-CoV-2) nucleic acids may be present in the submitted sample  additional confirmatory testing may be necessary for epidemiological  and / or clinical management purposes  to differentiate between  SARS-CoV-2 and other Sarbecovirus currently known to infect humans.  If clinically indicated additional testing with an alternate test  methodology [phone]) is advised. The SARS-CoV-2 RNA is generally  detectable in upper and lower respiratory sp ecimens during the acute  phase of infection. The expected result is Negative. Fact Sheet for Patients:  StrictlyIdeas.no Fact Sheet for Healthcare Providers: BankingDealers.co.za This test is not yet approved or cleared by the  [place] FDA and has been authorized for detection and/or diagnosis of SARS-CoV-2 by FDA under an Emergency Use Authorization (EUA).  This EUA will remain in effect (meaning this test can be used) for the duration of the COVID-19 declaration under Section 564(b)(1) of the Act, 21 U.S.C. section 360bbb-3(b)(1), unless the authorization is terminated or revoked sooner. Performed at [facility], [place] [address] [place],  [address]      Time coordinating discharge: 32 minutes  SIGNED:   Barb Merino, MD  [facility] 11/17/2018, 11:01 AM Pager [phone]  If 7PM-7AM, please contact night-coverage [email or web address] Password [email or web address]

## 2018-11-17 NOTE — Progress Notes (Signed)
Patient had 6 beats of SVT, Asymptomatic. PCP was notified

## 2018-11-18 DIAGNOSIS — E785 Hyperlipidemia, unspecified: Secondary | ICD-10-CM | POA: Diagnosis not present

## 2018-11-18 DIAGNOSIS — K219 Gastro-esophageal reflux disease without esophagitis: Secondary | ICD-10-CM | POA: Diagnosis not present

## 2018-11-18 DIAGNOSIS — S72101D Unspecified trochanteric fracture of right femur, subsequent encounter for closed fracture with routine healing: Secondary | ICD-10-CM | POA: Diagnosis not present

## 2018-11-18 DIAGNOSIS — J45909 Unspecified asthma, uncomplicated: Secondary | ICD-10-CM | POA: Diagnosis not present

## 2018-11-18 DIAGNOSIS — R69 Illness, unspecified: Secondary | ICD-10-CM | POA: Diagnosis not present

## 2018-11-18 DIAGNOSIS — I10 Essential (primary) hypertension: Secondary | ICD-10-CM | POA: Diagnosis not present

## 2018-11-18 DIAGNOSIS — M1990 Unspecified osteoarthritis, unspecified site: Secondary | ICD-10-CM | POA: Diagnosis not present

## 2018-11-18 DIAGNOSIS — I350 Nonrheumatic aortic (valve) stenosis: Secondary | ICD-10-CM | POA: Diagnosis not present

## 2018-11-18 DIAGNOSIS — Z853 Personal history of malignant neoplasm of breast: Secondary | ICD-10-CM | POA: Diagnosis not present

## 2018-11-18 DIAGNOSIS — R296 Repeated falls: Secondary | ICD-10-CM | POA: Diagnosis not present

## 2018-11-19 DIAGNOSIS — M1990 Unspecified osteoarthritis, unspecified site: Secondary | ICD-10-CM | POA: Diagnosis not present

## 2018-11-19 DIAGNOSIS — S72101D Unspecified trochanteric fracture of right femur, subsequent encounter for closed fracture with routine healing: Secondary | ICD-10-CM | POA: Diagnosis not present

## 2018-11-19 DIAGNOSIS — R69 Illness, unspecified: Secondary | ICD-10-CM | POA: Diagnosis not present

## 2018-11-19 DIAGNOSIS — Z853 Personal history of malignant neoplasm of breast: Secondary | ICD-10-CM | POA: Diagnosis not present

## 2018-11-19 DIAGNOSIS — I10 Essential (primary) hypertension: Secondary | ICD-10-CM | POA: Diagnosis not present

## 2018-11-19 DIAGNOSIS — R296 Repeated falls: Secondary | ICD-10-CM | POA: Diagnosis not present

## 2018-11-19 DIAGNOSIS — E785 Hyperlipidemia, unspecified: Secondary | ICD-10-CM | POA: Diagnosis not present

## 2018-11-19 DIAGNOSIS — I350 Nonrheumatic aortic (valve) stenosis: Secondary | ICD-10-CM | POA: Diagnosis not present

## 2018-11-19 DIAGNOSIS — J45909 Unspecified asthma, uncomplicated: Secondary | ICD-10-CM | POA: Diagnosis not present

## 2018-11-19 DIAGNOSIS — K219 Gastro-esophageal reflux disease without esophagitis: Secondary | ICD-10-CM | POA: Diagnosis not present

## 2018-11-21 DIAGNOSIS — K219 Gastro-esophageal reflux disease without esophagitis: Secondary | ICD-10-CM | POA: Diagnosis not present

## 2018-11-21 DIAGNOSIS — M1990 Unspecified osteoarthritis, unspecified site: Secondary | ICD-10-CM | POA: Diagnosis not present

## 2018-11-21 DIAGNOSIS — M19011 Primary osteoarthritis, right shoulder: Secondary | ICD-10-CM | POA: Diagnosis not present

## 2018-11-21 DIAGNOSIS — M62542 Muscle wasting and atrophy, not elsewhere classified, left hand: Secondary | ICD-10-CM | POA: Diagnosis not present

## 2018-11-21 DIAGNOSIS — S72101D Unspecified trochanteric fracture of right femur, subsequent encounter for closed fracture with routine healing: Secondary | ICD-10-CM | POA: Diagnosis not present

## 2018-11-21 DIAGNOSIS — M62511 Muscle wasting and atrophy, not elsewhere classified, right shoulder: Secondary | ICD-10-CM | POA: Diagnosis not present

## 2018-11-21 DIAGNOSIS — J45909 Unspecified asthma, uncomplicated: Secondary | ICD-10-CM | POA: Diagnosis not present

## 2018-11-21 DIAGNOSIS — R296 Repeated falls: Secondary | ICD-10-CM | POA: Diagnosis not present

## 2018-11-21 DIAGNOSIS — E785 Hyperlipidemia, unspecified: Secondary | ICD-10-CM | POA: Diagnosis not present

## 2018-11-21 DIAGNOSIS — I350 Nonrheumatic aortic (valve) stenosis: Secondary | ICD-10-CM | POA: Diagnosis not present

## 2018-11-21 DIAGNOSIS — R69 Illness, unspecified: Secondary | ICD-10-CM | POA: Diagnosis not present

## 2018-11-21 DIAGNOSIS — I10 Essential (primary) hypertension: Secondary | ICD-10-CM | POA: Diagnosis not present

## 2018-11-21 DIAGNOSIS — M19032 Primary osteoarthritis, left wrist: Secondary | ICD-10-CM | POA: Diagnosis not present

## 2018-11-21 DIAGNOSIS — Z853 Personal history of malignant neoplasm of breast: Secondary | ICD-10-CM | POA: Diagnosis not present

## 2018-11-22 DIAGNOSIS — M1990 Unspecified osteoarthritis, unspecified site: Secondary | ICD-10-CM | POA: Diagnosis not present

## 2018-11-22 DIAGNOSIS — I10 Essential (primary) hypertension: Secondary | ICD-10-CM | POA: Diagnosis not present

## 2018-11-22 DIAGNOSIS — E785 Hyperlipidemia, unspecified: Secondary | ICD-10-CM | POA: Diagnosis not present

## 2018-11-22 DIAGNOSIS — S72101D Unspecified trochanteric fracture of right femur, subsequent encounter for closed fracture with routine healing: Secondary | ICD-10-CM | POA: Diagnosis not present

## 2018-11-22 DIAGNOSIS — R69 Illness, unspecified: Secondary | ICD-10-CM | POA: Diagnosis not present

## 2018-11-22 DIAGNOSIS — I350 Nonrheumatic aortic (valve) stenosis: Secondary | ICD-10-CM | POA: Diagnosis not present

## 2018-11-22 DIAGNOSIS — J45909 Unspecified asthma, uncomplicated: Secondary | ICD-10-CM | POA: Diagnosis not present

## 2018-11-22 DIAGNOSIS — R296 Repeated falls: Secondary | ICD-10-CM | POA: Diagnosis not present

## 2018-11-22 DIAGNOSIS — Z853 Personal history of malignant neoplasm of breast: Secondary | ICD-10-CM | POA: Diagnosis not present

## 2018-11-22 DIAGNOSIS — K219 Gastro-esophageal reflux disease without esophagitis: Secondary | ICD-10-CM | POA: Diagnosis not present

## 2018-11-23 ENCOUNTER — Other Ambulatory Visit: Payer: Self-pay | Admitting: Hematology and Oncology

## 2018-11-23 DIAGNOSIS — C50412 Malignant neoplasm of upper-outer quadrant of left female breast: Secondary | ICD-10-CM

## 2018-11-24 DIAGNOSIS — M1990 Unspecified osteoarthritis, unspecified site: Secondary | ICD-10-CM | POA: Diagnosis not present

## 2018-11-24 DIAGNOSIS — E785 Hyperlipidemia, unspecified: Secondary | ICD-10-CM | POA: Diagnosis not present

## 2018-11-24 DIAGNOSIS — I350 Nonrheumatic aortic (valve) stenosis: Secondary | ICD-10-CM | POA: Diagnosis not present

## 2018-11-24 DIAGNOSIS — S72101D Unspecified trochanteric fracture of right femur, subsequent encounter for closed fracture with routine healing: Secondary | ICD-10-CM | POA: Diagnosis not present

## 2018-11-24 DIAGNOSIS — R69 Illness, unspecified: Secondary | ICD-10-CM | POA: Diagnosis not present

## 2018-11-24 DIAGNOSIS — I10 Essential (primary) hypertension: Secondary | ICD-10-CM | POA: Diagnosis not present

## 2018-11-24 DIAGNOSIS — R296 Repeated falls: Secondary | ICD-10-CM | POA: Diagnosis not present

## 2018-11-24 DIAGNOSIS — J45909 Unspecified asthma, uncomplicated: Secondary | ICD-10-CM | POA: Diagnosis not present

## 2018-11-24 DIAGNOSIS — K219 Gastro-esophageal reflux disease without esophagitis: Secondary | ICD-10-CM | POA: Diagnosis not present

## 2018-11-24 DIAGNOSIS — Z853 Personal history of malignant neoplasm of breast: Secondary | ICD-10-CM | POA: Diagnosis not present

## 2018-11-30 DIAGNOSIS — R69 Illness, unspecified: Secondary | ICD-10-CM | POA: Diagnosis not present

## 2018-11-30 DIAGNOSIS — I10 Essential (primary) hypertension: Secondary | ICD-10-CM | POA: Diagnosis not present

## 2018-11-30 DIAGNOSIS — Z853 Personal history of malignant neoplasm of breast: Secondary | ICD-10-CM | POA: Diagnosis not present

## 2018-11-30 DIAGNOSIS — K219 Gastro-esophageal reflux disease without esophagitis: Secondary | ICD-10-CM | POA: Diagnosis not present

## 2018-11-30 DIAGNOSIS — E785 Hyperlipidemia, unspecified: Secondary | ICD-10-CM | POA: Diagnosis not present

## 2018-11-30 DIAGNOSIS — J45909 Unspecified asthma, uncomplicated: Secondary | ICD-10-CM | POA: Diagnosis not present

## 2018-11-30 DIAGNOSIS — M1990 Unspecified osteoarthritis, unspecified site: Secondary | ICD-10-CM | POA: Diagnosis not present

## 2018-11-30 DIAGNOSIS — I350 Nonrheumatic aortic (valve) stenosis: Secondary | ICD-10-CM | POA: Diagnosis not present

## 2018-11-30 DIAGNOSIS — R296 Repeated falls: Secondary | ICD-10-CM | POA: Diagnosis not present

## 2018-11-30 DIAGNOSIS — S72101D Unspecified trochanteric fracture of right femur, subsequent encounter for closed fracture with routine healing: Secondary | ICD-10-CM | POA: Diagnosis not present

## 2018-12-01 ENCOUNTER — Other Ambulatory Visit: Payer: Self-pay | Admitting: *Deleted

## 2018-12-01 DIAGNOSIS — M810 Age-related osteoporosis without current pathological fracture: Secondary | ICD-10-CM | POA: Diagnosis not present

## 2018-12-01 DIAGNOSIS — E7800 Pure hypercholesterolemia, unspecified: Secondary | ICD-10-CM | POA: Diagnosis not present

## 2018-12-01 DIAGNOSIS — C50912 Malignant neoplasm of unspecified site of left female breast: Secondary | ICD-10-CM | POA: Diagnosis not present

## 2018-12-01 DIAGNOSIS — I10 Essential (primary) hypertension: Secondary | ICD-10-CM | POA: Diagnosis not present

## 2018-12-01 DIAGNOSIS — D649 Anemia, unspecified: Secondary | ICD-10-CM | POA: Diagnosis not present

## 2018-12-01 DIAGNOSIS — D500 Iron deficiency anemia secondary to blood loss (chronic): Secondary | ICD-10-CM | POA: Diagnosis not present

## 2018-12-01 DIAGNOSIS — C50412 Malignant neoplasm of upper-outer quadrant of left female breast: Secondary | ICD-10-CM

## 2018-12-01 MED ORDER — GABAPENTIN 300 MG PO CAPS: 300.0000 mg | ORAL_CAPSULE | Freq: Every day | ORAL | 0 refills | Status: DC

## 2018-12-02 DIAGNOSIS — E785 Hyperlipidemia, unspecified: Secondary | ICD-10-CM | POA: Diagnosis not present

## 2018-12-02 DIAGNOSIS — I350 Nonrheumatic aortic (valve) stenosis: Secondary | ICD-10-CM | POA: Diagnosis not present

## 2018-12-02 DIAGNOSIS — R69 Illness, unspecified: Secondary | ICD-10-CM | POA: Diagnosis not present

## 2018-12-02 DIAGNOSIS — R296 Repeated falls: Secondary | ICD-10-CM | POA: Diagnosis not present

## 2018-12-02 DIAGNOSIS — K219 Gastro-esophageal reflux disease without esophagitis: Secondary | ICD-10-CM | POA: Diagnosis not present

## 2018-12-02 DIAGNOSIS — M1990 Unspecified osteoarthritis, unspecified site: Secondary | ICD-10-CM | POA: Diagnosis not present

## 2018-12-02 DIAGNOSIS — I10 Essential (primary) hypertension: Secondary | ICD-10-CM | POA: Diagnosis not present

## 2018-12-02 DIAGNOSIS — Z853 Personal history of malignant neoplasm of breast: Secondary | ICD-10-CM | POA: Diagnosis not present

## 2018-12-02 DIAGNOSIS — S72101D Unspecified trochanteric fracture of right femur, subsequent encounter for closed fracture with routine healing: Secondary | ICD-10-CM | POA: Diagnosis not present

## 2018-12-02 DIAGNOSIS — J45909 Unspecified asthma, uncomplicated: Secondary | ICD-10-CM | POA: Diagnosis not present

## 2018-12-07 DIAGNOSIS — I10 Essential (primary) hypertension: Secondary | ICD-10-CM | POA: Diagnosis not present

## 2018-12-07 DIAGNOSIS — J45909 Unspecified asthma, uncomplicated: Secondary | ICD-10-CM | POA: Diagnosis not present

## 2018-12-07 DIAGNOSIS — S72101D Unspecified trochanteric fracture of right femur, subsequent encounter for closed fracture with routine healing: Secondary | ICD-10-CM | POA: Diagnosis not present

## 2018-12-07 DIAGNOSIS — R296 Repeated falls: Secondary | ICD-10-CM | POA: Diagnosis not present

## 2018-12-07 DIAGNOSIS — Z853 Personal history of malignant neoplasm of breast: Secondary | ICD-10-CM | POA: Diagnosis not present

## 2018-12-07 DIAGNOSIS — K219 Gastro-esophageal reflux disease without esophagitis: Secondary | ICD-10-CM | POA: Diagnosis not present

## 2018-12-07 DIAGNOSIS — M1990 Unspecified osteoarthritis, unspecified site: Secondary | ICD-10-CM | POA: Diagnosis not present

## 2018-12-07 DIAGNOSIS — E785 Hyperlipidemia, unspecified: Secondary | ICD-10-CM | POA: Diagnosis not present

## 2018-12-07 DIAGNOSIS — I350 Nonrheumatic aortic (valve) stenosis: Secondary | ICD-10-CM | POA: Diagnosis not present

## 2018-12-07 DIAGNOSIS — R69 Illness, unspecified: Secondary | ICD-10-CM | POA: Diagnosis not present

## 2018-12-08 DIAGNOSIS — S72101D Unspecified trochanteric fracture of right femur, subsequent encounter for closed fracture with routine healing: Secondary | ICD-10-CM | POA: Diagnosis not present

## 2018-12-08 DIAGNOSIS — R296 Repeated falls: Secondary | ICD-10-CM | POA: Diagnosis not present

## 2018-12-08 DIAGNOSIS — E785 Hyperlipidemia, unspecified: Secondary | ICD-10-CM | POA: Diagnosis not present

## 2018-12-08 DIAGNOSIS — J45909 Unspecified asthma, uncomplicated: Secondary | ICD-10-CM | POA: Diagnosis not present

## 2018-12-08 DIAGNOSIS — M1990 Unspecified osteoarthritis, unspecified site: Secondary | ICD-10-CM | POA: Diagnosis not present

## 2018-12-08 DIAGNOSIS — K219 Gastro-esophageal reflux disease without esophagitis: Secondary | ICD-10-CM | POA: Diagnosis not present

## 2018-12-08 DIAGNOSIS — I350 Nonrheumatic aortic (valve) stenosis: Secondary | ICD-10-CM | POA: Diagnosis not present

## 2018-12-08 DIAGNOSIS — Z853 Personal history of malignant neoplasm of breast: Secondary | ICD-10-CM | POA: Diagnosis not present

## 2018-12-08 DIAGNOSIS — R69 Illness, unspecified: Secondary | ICD-10-CM | POA: Diagnosis not present

## 2018-12-08 DIAGNOSIS — I10 Essential (primary) hypertension: Secondary | ICD-10-CM | POA: Diagnosis not present

## 2018-12-09 DIAGNOSIS — E785 Hyperlipidemia, unspecified: Secondary | ICD-10-CM | POA: Diagnosis not present

## 2018-12-09 DIAGNOSIS — R69 Illness, unspecified: Secondary | ICD-10-CM | POA: Diagnosis not present

## 2018-12-09 DIAGNOSIS — I10 Essential (primary) hypertension: Secondary | ICD-10-CM | POA: Diagnosis not present

## 2018-12-09 DIAGNOSIS — R296 Repeated falls: Secondary | ICD-10-CM | POA: Diagnosis not present

## 2018-12-09 DIAGNOSIS — S72101D Unspecified trochanteric fracture of right femur, subsequent encounter for closed fracture with routine healing: Secondary | ICD-10-CM | POA: Diagnosis not present

## 2018-12-09 DIAGNOSIS — K219 Gastro-esophageal reflux disease without esophagitis: Secondary | ICD-10-CM | POA: Diagnosis not present

## 2018-12-09 DIAGNOSIS — I350 Nonrheumatic aortic (valve) stenosis: Secondary | ICD-10-CM | POA: Diagnosis not present

## 2018-12-09 DIAGNOSIS — M1990 Unspecified osteoarthritis, unspecified site: Secondary | ICD-10-CM | POA: Diagnosis not present

## 2018-12-09 DIAGNOSIS — Z853 Personal history of malignant neoplasm of breast: Secondary | ICD-10-CM | POA: Diagnosis not present

## 2018-12-09 DIAGNOSIS — J45909 Unspecified asthma, uncomplicated: Secondary | ICD-10-CM | POA: Diagnosis not present

## 2018-12-13 DIAGNOSIS — J45909 Unspecified asthma, uncomplicated: Secondary | ICD-10-CM | POA: Diagnosis not present

## 2018-12-13 DIAGNOSIS — R69 Illness, unspecified: Secondary | ICD-10-CM | POA: Diagnosis not present

## 2018-12-13 DIAGNOSIS — Z853 Personal history of malignant neoplasm of breast: Secondary | ICD-10-CM | POA: Diagnosis not present

## 2018-12-13 DIAGNOSIS — I10 Essential (primary) hypertension: Secondary | ICD-10-CM | POA: Diagnosis not present

## 2018-12-13 DIAGNOSIS — E785 Hyperlipidemia, unspecified: Secondary | ICD-10-CM | POA: Diagnosis not present

## 2018-12-13 DIAGNOSIS — K219 Gastro-esophageal reflux disease without esophagitis: Secondary | ICD-10-CM | POA: Diagnosis not present

## 2018-12-13 DIAGNOSIS — R296 Repeated falls: Secondary | ICD-10-CM | POA: Diagnosis not present

## 2018-12-13 DIAGNOSIS — M1990 Unspecified osteoarthritis, unspecified site: Secondary | ICD-10-CM | POA: Diagnosis not present

## 2018-12-13 DIAGNOSIS — S72101D Unspecified trochanteric fracture of right femur, subsequent encounter for closed fracture with routine healing: Secondary | ICD-10-CM | POA: Diagnosis not present

## 2018-12-13 DIAGNOSIS — I350 Nonrheumatic aortic (valve) stenosis: Secondary | ICD-10-CM | POA: Diagnosis not present

## 2018-12-14 DIAGNOSIS — E785 Hyperlipidemia, unspecified: Secondary | ICD-10-CM | POA: Diagnosis not present

## 2018-12-14 DIAGNOSIS — Z853 Personal history of malignant neoplasm of breast: Secondary | ICD-10-CM | POA: Diagnosis not present

## 2018-12-14 DIAGNOSIS — R69 Illness, unspecified: Secondary | ICD-10-CM | POA: Diagnosis not present

## 2018-12-14 DIAGNOSIS — K219 Gastro-esophageal reflux disease without esophagitis: Secondary | ICD-10-CM | POA: Diagnosis not present

## 2018-12-14 DIAGNOSIS — I350 Nonrheumatic aortic (valve) stenosis: Secondary | ICD-10-CM | POA: Diagnosis not present

## 2018-12-14 DIAGNOSIS — M1990 Unspecified osteoarthritis, unspecified site: Secondary | ICD-10-CM | POA: Diagnosis not present

## 2018-12-14 DIAGNOSIS — J45909 Unspecified asthma, uncomplicated: Secondary | ICD-10-CM | POA: Diagnosis not present

## 2018-12-14 DIAGNOSIS — S72101D Unspecified trochanteric fracture of right femur, subsequent encounter for closed fracture with routine healing: Secondary | ICD-10-CM | POA: Diagnosis not present

## 2018-12-14 DIAGNOSIS — R296 Repeated falls: Secondary | ICD-10-CM | POA: Diagnosis not present

## 2018-12-14 DIAGNOSIS — I10 Essential (primary) hypertension: Secondary | ICD-10-CM | POA: Diagnosis not present

## 2018-12-15 DIAGNOSIS — M1990 Unspecified osteoarthritis, unspecified site: Secondary | ICD-10-CM | POA: Diagnosis not present

## 2018-12-15 DIAGNOSIS — Z853 Personal history of malignant neoplasm of breast: Secondary | ICD-10-CM | POA: Diagnosis not present

## 2018-12-15 DIAGNOSIS — R69 Illness, unspecified: Secondary | ICD-10-CM | POA: Diagnosis not present

## 2018-12-15 DIAGNOSIS — I350 Nonrheumatic aortic (valve) stenosis: Secondary | ICD-10-CM | POA: Diagnosis not present

## 2018-12-15 DIAGNOSIS — E785 Hyperlipidemia, unspecified: Secondary | ICD-10-CM | POA: Diagnosis not present

## 2018-12-15 DIAGNOSIS — K219 Gastro-esophageal reflux disease without esophagitis: Secondary | ICD-10-CM | POA: Diagnosis not present

## 2018-12-15 DIAGNOSIS — S72101D Unspecified trochanteric fracture of right femur, subsequent encounter for closed fracture with routine healing: Secondary | ICD-10-CM | POA: Diagnosis not present

## 2018-12-15 DIAGNOSIS — I10 Essential (primary) hypertension: Secondary | ICD-10-CM | POA: Diagnosis not present

## 2018-12-15 DIAGNOSIS — J45909 Unspecified asthma, uncomplicated: Secondary | ICD-10-CM | POA: Diagnosis not present

## 2018-12-15 DIAGNOSIS — R296 Repeated falls: Secondary | ICD-10-CM | POA: Diagnosis not present

## 2018-12-19 DIAGNOSIS — I10 Essential (primary) hypertension: Secondary | ICD-10-CM | POA: Diagnosis not present

## 2018-12-19 DIAGNOSIS — I350 Nonrheumatic aortic (valve) stenosis: Secondary | ICD-10-CM | POA: Diagnosis not present

## 2018-12-19 DIAGNOSIS — Z853 Personal history of malignant neoplasm of breast: Secondary | ICD-10-CM | POA: Diagnosis not present

## 2018-12-19 DIAGNOSIS — R296 Repeated falls: Secondary | ICD-10-CM | POA: Diagnosis not present

## 2018-12-19 DIAGNOSIS — R69 Illness, unspecified: Secondary | ICD-10-CM | POA: Diagnosis not present

## 2018-12-19 DIAGNOSIS — K219 Gastro-esophageal reflux disease without esophagitis: Secondary | ICD-10-CM | POA: Diagnosis not present

## 2018-12-19 DIAGNOSIS — S72101D Unspecified trochanteric fracture of right femur, subsequent encounter for closed fracture with routine healing: Secondary | ICD-10-CM | POA: Diagnosis not present

## 2018-12-19 DIAGNOSIS — E785 Hyperlipidemia, unspecified: Secondary | ICD-10-CM | POA: Diagnosis not present

## 2018-12-19 DIAGNOSIS — M1990 Unspecified osteoarthritis, unspecified site: Secondary | ICD-10-CM | POA: Diagnosis not present

## 2018-12-19 DIAGNOSIS — J45909 Unspecified asthma, uncomplicated: Secondary | ICD-10-CM | POA: Diagnosis not present

## 2018-12-20 DIAGNOSIS — Z96641 Presence of right artificial hip joint: Secondary | ICD-10-CM | POA: Diagnosis not present

## 2018-12-20 DIAGNOSIS — Z5189 Encounter for other specified aftercare: Secondary | ICD-10-CM | POA: Diagnosis not present

## 2018-12-21 DIAGNOSIS — I10 Essential (primary) hypertension: Secondary | ICD-10-CM | POA: Diagnosis not present

## 2018-12-21 DIAGNOSIS — Z853 Personal history of malignant neoplasm of breast: Secondary | ICD-10-CM | POA: Diagnosis not present

## 2018-12-21 DIAGNOSIS — R296 Repeated falls: Secondary | ICD-10-CM | POA: Diagnosis not present

## 2018-12-21 DIAGNOSIS — R69 Illness, unspecified: Secondary | ICD-10-CM | POA: Diagnosis not present

## 2018-12-21 DIAGNOSIS — S72101D Unspecified trochanteric fracture of right femur, subsequent encounter for closed fracture with routine healing: Secondary | ICD-10-CM | POA: Diagnosis not present

## 2018-12-21 DIAGNOSIS — J45909 Unspecified asthma, uncomplicated: Secondary | ICD-10-CM | POA: Diagnosis not present

## 2018-12-21 DIAGNOSIS — I350 Nonrheumatic aortic (valve) stenosis: Secondary | ICD-10-CM | POA: Diagnosis not present

## 2018-12-21 DIAGNOSIS — M1990 Unspecified osteoarthritis, unspecified site: Secondary | ICD-10-CM | POA: Diagnosis not present

## 2018-12-21 DIAGNOSIS — E785 Hyperlipidemia, unspecified: Secondary | ICD-10-CM | POA: Diagnosis not present

## 2018-12-21 DIAGNOSIS — K219 Gastro-esophageal reflux disease without esophagitis: Secondary | ICD-10-CM | POA: Diagnosis not present

## 2018-12-22 DIAGNOSIS — M62511 Muscle wasting and atrophy, not elsewhere classified, right shoulder: Secondary | ICD-10-CM | POA: Diagnosis not present

## 2018-12-22 DIAGNOSIS — M62542 Muscle wasting and atrophy, not elsewhere classified, left hand: Secondary | ICD-10-CM | POA: Diagnosis not present

## 2018-12-23 DIAGNOSIS — I350 Nonrheumatic aortic (valve) stenosis: Secondary | ICD-10-CM | POA: Diagnosis not present

## 2018-12-23 DIAGNOSIS — E785 Hyperlipidemia, unspecified: Secondary | ICD-10-CM | POA: Diagnosis not present

## 2018-12-23 DIAGNOSIS — R69 Illness, unspecified: Secondary | ICD-10-CM | POA: Diagnosis not present

## 2018-12-23 DIAGNOSIS — S72101D Unspecified trochanteric fracture of right femur, subsequent encounter for closed fracture with routine healing: Secondary | ICD-10-CM | POA: Diagnosis not present

## 2018-12-23 DIAGNOSIS — I10 Essential (primary) hypertension: Secondary | ICD-10-CM | POA: Diagnosis not present

## 2018-12-23 DIAGNOSIS — R296 Repeated falls: Secondary | ICD-10-CM | POA: Diagnosis not present

## 2018-12-23 DIAGNOSIS — Z853 Personal history of malignant neoplasm of breast: Secondary | ICD-10-CM | POA: Diagnosis not present

## 2018-12-23 DIAGNOSIS — K219 Gastro-esophageal reflux disease without esophagitis: Secondary | ICD-10-CM | POA: Diagnosis not present

## 2018-12-23 DIAGNOSIS — M1990 Unspecified osteoarthritis, unspecified site: Secondary | ICD-10-CM | POA: Diagnosis not present

## 2018-12-23 DIAGNOSIS — J45909 Unspecified asthma, uncomplicated: Secondary | ICD-10-CM | POA: Diagnosis not present

## 2018-12-30 DIAGNOSIS — D649 Anemia, unspecified: Secondary | ICD-10-CM | POA: Diagnosis not present

## 2018-12-30 DIAGNOSIS — M810 Age-related osteoporosis without current pathological fracture: Secondary | ICD-10-CM | POA: Diagnosis not present

## 2018-12-30 DIAGNOSIS — R69 Illness, unspecified: Secondary | ICD-10-CM | POA: Diagnosis not present

## 2018-12-30 DIAGNOSIS — S72009G Fracture of unspecified part of neck of unspecified femur, subsequent encounter for closed fracture with delayed healing: Secondary | ICD-10-CM | POA: Diagnosis not present

## 2018-12-31 ENCOUNTER — Other Ambulatory Visit: Payer: Self-pay | Admitting: Hematology and Oncology

## 2018-12-31 DIAGNOSIS — C50412 Malignant neoplasm of upper-outer quadrant of left female breast: Secondary | ICD-10-CM

## 2019-01-04 DIAGNOSIS — Z79899 Other long term (current) drug therapy: Secondary | ICD-10-CM | POA: Diagnosis not present

## 2019-01-04 DIAGNOSIS — Z79891 Long term (current) use of opiate analgesic: Secondary | ICD-10-CM | POA: Diagnosis not present

## 2019-01-12 ENCOUNTER — Telehealth: Payer: Self-pay | Admitting: Hematology and Oncology

## 2019-01-12 NOTE — Telephone Encounter (Signed)
Called pt per 7/09 sch message - no answer and no vmail to leave message.

## 2019-01-21 DIAGNOSIS — M62542 Muscle wasting and atrophy, not elsewhere classified, left hand: Secondary | ICD-10-CM | POA: Diagnosis not present

## 2019-01-21 DIAGNOSIS — M62511 Muscle wasting and atrophy, not elsewhere classified, right shoulder: Secondary | ICD-10-CM | POA: Diagnosis not present

## 2019-01-24 ENCOUNTER — Ambulatory Visit: Payer: Medicare HMO | Admitting: Hematology and Oncology

## 2019-01-26 ENCOUNTER — Other Ambulatory Visit: Payer: Self-pay | Admitting: Family Medicine

## 2019-01-30 ENCOUNTER — Ambulatory Visit
Admission: RE | Admit: 2019-01-30 | Discharge: 2019-01-30 | Disposition: A | Discharge status: Home / Self Care | Payer: Medicare HMO | Source: Ambulatory Visit | Attending: Family Medicine | Admitting: Family Medicine

## 2019-01-30 ENCOUNTER — Other Ambulatory Visit: Payer: Self-pay

## 2019-01-30 DIAGNOSIS — R9389 Abnormal findings on diagnostic imaging of other specified body structures: Secondary | ICD-10-CM

## 2019-01-30 DIAGNOSIS — J929 Pleural plaque without asbestos: Secondary | ICD-10-CM | POA: Diagnosis not present

## 2019-02-02 NOTE — Progress Notes (Signed)
Patient Care Team: Kathyrn Lass, MD as PCP - General  DIAGNOSIS:    ICD-10-CM   1. Primary cancer of upper outer quadrant of left female breast ([place])  C50.412     SUMMARY OF ONCOLOGIC HISTORY: Oncology History  Primary cancer of upper outer quadrant of left female breast ([place])  12/18/2011 Surgery   Left mastectomy: 2 foci of well-differentiated invasive ductal carcinoma 1.7 cm, 0.7 cm with DCIS, lymphovascular invasion present, one positive SLN with ECS, ER/PR positive, HER-2 negative, Ki-67 13%; 0.7 cm tumor: ER 100, PR 7% Her 2Pos 4.94, Ki 50%   02/09/2012 - 02/06/2013 Chemotherapy   Adjuvant chemotherapy Taxol Herceptin X 10 discontinued due to intolerance followed by Herceptin maintenance   04/26/2012 - 06/10/2012 Radiation Therapy   Adjuvant radiation therapy   06/10/2012 - 04/06/2017 Anti-estrogen oral therapy   Letrozole 2.5 mg daily   11/15/2016 - 11/25/2016 Hospital Admission   Incarcerated incisional hernia status post small bowel resection and repair 11/17/2016     CHIEF COMPLIANT: Follow-up of left breast cancer   INTERVAL HISTORY: Brandy [name] is a 85 y.o. with above-mentioned history of left breast cancer treated with mastectomy, adjuvant chemotherapy, and radiation and who is currently on letrozole. I last saw her 2 years ago. In the interim she was hospitalized in 11/2018 for a right hip fracture. She presents to the clinic today for annual follow-up.  She has not been on antiestrogen therapy since 2018. She is walking with the help of a walker today. She does not want to do any further mammograms.  REVIEW OF SYSTEMS:   Constitutional: Denies fevers, chills or abnormal weight loss Eyes: Denies blurriness of vision Ears, nose, mouth, throat, and face: Denies mucositis or sore throat Respiratory: Denies cough, dyspnea or wheezes Cardiovascular: Denies palpitation, chest discomfort Gastrointestinal: Denies nausea, heartburn or change in bowel habits Skin: Denies  abnormal skin rashes Lymphatics: Denies new lymphadenopathy or easy bruising Neurological: Denies numbness, tingling or new weaknesses Behavioral/Psych: Mood is stable, no new changes  Extremities: No lower extremity edema Breast: Left mastectomy All other systems were reviewed with the patient and are negative.  I have reviewed the past medical history, past surgical history, social history and family history with the patient and they are unchanged from previous note.  ALLERGIES:  has No Known Allergies.  MEDICATIONS:  Current Outpatient Medications  Medication Sig Dispense Refill  . acetaminophen (TYLENOL) 500 MG tablet Take 1,000 mg by mouth every 6 (six) hours as needed (pain).    Marland Kitchen albuterol (VENTOLIN HFA) 108 (90 Base) MCG/ACT inhaler Inhale 2 puffs into the lungs every 6 (six) hours. 1 Inhaler 0  . atorvastatin (LIPITOR) 40 MG tablet Take 40 mg by mouth daily.     . citalopram (CELEXA) 20 MG tablet Take 1 tablet (20 mg total) by mouth daily. 30 tablet 0  . Cyanocobalamin (VITAMIN B-12 PO) Take 1 tablet by mouth daily.    Marland Kitchen docusate sodium (COLACE) 100 MG capsule Take 1 capsule (100 mg total) by mouth daily. (Patient taking differently: Take 100 mg by mouth daily as needed (constipation). ) 30 capsule 1  . ferrous sulfate 325 (65 FE) MG tablet Take 1 tablet (325 mg total) by mouth 2 (two) times daily with a meal. (Patient taking differently: Take 325 mg by mouth daily. ) 60 tablet 0  . gabapentin (NEURONTIN) 300 MG capsule TAKE 1 CAPSULE BY MOUTH EVERYDAY AT BEDTIME 90 capsule 0  . Multiple Vitamin (MULTIVITAMIN WITH MINERALS) TABS tablet  Take 1 tablet by mouth daily.    Marland Kitchen omeprazole (PRILOSEC) 40 MG capsule TAKE 1 CAPSULE (40 MG TOTAL) BY MOUTH 2 (TWO) TIMES DAILY BEFORE A MEAL. 180 capsule 3  . phenol (CHLORASEPTIC) 1.4 % LIQD Use as directed 1 spray in the mouth or throat as needed for throat irritation / pain. 1 Bottle 0  . traMADol (ULTRAM) 50 MG tablet Take 2 tablets (100 mg  total) by mouth 4 (four) times daily. 40 tablet 0  . Vitamin D, Ergocalciferol, (DRISDOL) 50000 units CAPS capsule Take 50,000 Units by mouth every Thursday.      No current facility-administered medications for this visit.    Facility-Administered Medications Ordered in Other Visits  Medication Dose Route Frequency Provider Last Rate Last Dose  . sodium chloride 0.9 % injection 10 mL  10 mL Intracatheter PRN Marcy Panning, MD   10 mL at 03/14/12 1628    PHYSICAL EXAMINATION: ECOG PERFORMANCE STATUS: 1 - Symptomatic but completely ambulatory  Vitals:   02/03/19 1106  BP: (!) 141/85  Pulse: 97  Resp: 17  Temp: 98.5 F (36.9 C)  SpO2: 98%   Filed Weights   02/03/19 1106  Weight: 137 lb 3.2 oz (62.2 kg)    GENERAL: alert, no distress and comfortable SKIN: skin color, texture, turgor are normal, no rashes or significant lesions EYES: normal, Conjunctiva are pink and non-injected, sclera clear OROPHARYNX: no exudate, no erythema and lips, buccal mucosa, and tongue normal  NECK: supple, thyroid normal size, non-tender, without nodularity LYMPH: no palpable lymphadenopathy in the cervical, axillary or inguinal LUNGS: clear to auscultation and percussion with normal breathing effort HEART: regular rate & rhythm and no murmurs and no lower extremity edema ABDOMEN: abdomen soft, non-tender and normal bowel sounds MUSCULOSKELETAL: no cyanosis of digits and no clubbing  NEURO: alert & oriented x 3 with fluent speech, no focal motor/sensory deficits EXTREMITIES: No lower extremity edema BREAST: Left mastectomy (exam performed in the presence of a chaperone)  LABORATORY DATA:  I have reviewed the data as listed CMP Latest Ref Rng & Units 11/16/2018 11/15/2018 11/11/2018  Glucose 70 - 99 mg/dL 107(H) 136(H) 121(H)  BUN 8 - 23 mg/dL '16 15 20  ' Creatinine 0.44 - 1.00 mg/dL 0.60 0.73 0.86  Sodium 135 - 145 mmol/L 136 138 141  Potassium 3.5 - 5.1 mmol/L 4.7 4.8 4.0  Chloride 98 - 111 mmol/L  104 106 108  CO2 22 - 32 mmol/L '25 22 22  ' Calcium 8.9 - 10.3 mg/dL 8.8(L) 8.6(L) 9.2  Total Protein 6.5 - 8.1 g/dL - - -  Total Bilirubin 0.3 - 1.2 mg/dL - - -  Alkaline Phos 38 - 126 U/L - - -  AST 15 - 41 U/L - - -  ALT 0 - 44 U/L - - -    Lab Results  Component Value Date   WBC 9.4 11/17/2018   HGB 9.2 (L) 11/17/2018   HCT 29.7 (L) 11/17/2018   MCV 97.7 11/17/2018   PLT 249 11/17/2018   NEUTROABS 7.6 11/11/2018    ASSESSMENT & PLAN:  Primary cancer of upper outer quadrant of left female breast Left breast cancer multifocal disease 1.7 cm and 0.7 cm, one sentinel lymph node positive with focal extracapsular extension: 1.7 cm mass was ER/PR positive and HER-2 negative with a Ki-67 of 30%: The 0.7 cm mass was ER 100%, PR 7%, Ki-67 15%, HER-2 positive ratio 4.94, status post chemotherapy with Taxol Herceptin from 02/09/2012 to 04/04/2012 discontinued early due  to Taxol side effects followed by Herceptin maintenance followed by radiation therapy and adjuvant letrozole was started December 2013 stopped December 2018  Breast cancer surveillance: Patient does not want to do any further mammograms on the right breast. She has had hip replacement surgery after breaking her hip.  We will see her on an as-needed basis.    No orders of the defined types were placed in this encounter.  The patient has a good understanding of the overall plan. she agrees with it. she will call with any problems that may develop before the next visit here.  Nicholas Lose, MD 02/03/2019  Julious Oka Dorshimer am acting as scribe for Dr. Nicholas Lose.  I have reviewed the above documentation for accuracy and completeness, and I agree with the above.

## 2019-02-03 ENCOUNTER — Inpatient Hospital Stay: Payer: Medicare HMO | Attending: Hematology and Oncology | Admitting: Hematology and Oncology

## 2019-02-03 ENCOUNTER — Other Ambulatory Visit: Payer: Self-pay

## 2019-02-03 DIAGNOSIS — Z79899 Other long term (current) drug therapy: Secondary | ICD-10-CM

## 2019-02-03 DIAGNOSIS — Z8781 Personal history of (healed) traumatic fracture: Secondary | ICD-10-CM | POA: Diagnosis not present

## 2019-02-03 DIAGNOSIS — Z923 Personal history of irradiation: Secondary | ICD-10-CM

## 2019-02-03 DIAGNOSIS — Z9221 Personal history of antineoplastic chemotherapy: Secondary | ICD-10-CM | POA: Diagnosis not present

## 2019-02-03 DIAGNOSIS — Z853 Personal history of malignant neoplasm of breast: Secondary | ICD-10-CM | POA: Diagnosis not present

## 2019-02-03 DIAGNOSIS — Z9012 Acquired absence of left breast and nipple: Secondary | ICD-10-CM | POA: Diagnosis not present

## 2019-02-03 DIAGNOSIS — C50412 Malignant neoplasm of upper-outer quadrant of left female breast: Secondary | ICD-10-CM

## 2019-02-03 NOTE — Assessment & Plan Note (Signed)
Left breast cancer multifocal disease 1.7 cm and 0.7 cm, one sentinel lymph node positive with focal extracapsular extension: 1.7 cm mass was ER/PR positive and HER-2 negative with a Ki-67 of 30%: The 0.7 cm mass was ER 100%, PR 7%, Ki-67 15%, HER-2 positive ratio 4.94, status post chemotherapy with Taxol Herceptin from 02/09/2012 to 04/04/2012 discontinued early due to Taxol side effects followed by Herceptin maintenance followed by radiation therapy and adjuvant letrozole was started December 2013 stopped December 2018  Breast cancer surveillance: Patient does not want to do any further mammograms on the right breast. She has had hip replacement surgery after breaking her hip.  We will see her on an as-needed basis. 

## 2019-02-04 ENCOUNTER — Other Ambulatory Visit (HOSPITAL_COMMUNITY): Payer: Self-pay | Admitting: Cardiology

## 2019-02-14 DIAGNOSIS — I10 Essential (primary) hypertension: Secondary | ICD-10-CM | POA: Diagnosis not present

## 2019-03-06 DIAGNOSIS — D500 Iron deficiency anemia secondary to blood loss (chronic): Secondary | ICD-10-CM | POA: Diagnosis not present

## 2019-03-06 DIAGNOSIS — M810 Age-related osteoporosis without current pathological fracture: Secondary | ICD-10-CM | POA: Diagnosis not present

## 2019-03-06 DIAGNOSIS — D649 Anemia, unspecified: Secondary | ICD-10-CM | POA: Diagnosis not present

## 2019-03-06 DIAGNOSIS — E7800 Pure hypercholesterolemia, unspecified: Secondary | ICD-10-CM | POA: Diagnosis not present

## 2019-03-06 DIAGNOSIS — C50912 Malignant neoplasm of unspecified site of left female breast: Secondary | ICD-10-CM | POA: Diagnosis not present

## 2019-03-06 DIAGNOSIS — J45909 Unspecified asthma, uncomplicated: Secondary | ICD-10-CM | POA: Diagnosis not present

## 2019-03-06 DIAGNOSIS — I10 Essential (primary) hypertension: Secondary | ICD-10-CM | POA: Diagnosis not present

## 2019-03-24 DIAGNOSIS — M62542 Muscle wasting and atrophy, not elsewhere classified, left hand: Secondary | ICD-10-CM | POA: Diagnosis not present

## 2019-03-24 DIAGNOSIS — M62511 Muscle wasting and atrophy, not elsewhere classified, right shoulder: Secondary | ICD-10-CM | POA: Diagnosis not present

## 2019-04-01 ENCOUNTER — Other Ambulatory Visit: Payer: Self-pay | Admitting: Hematology and Oncology

## 2019-04-01 DIAGNOSIS — C50412 Malignant neoplasm of upper-outer quadrant of left female breast: Secondary | ICD-10-CM

## 2019-05-29 DIAGNOSIS — R69 Illness, unspecified: Secondary | ICD-10-CM | POA: Diagnosis not present

## 2019-05-31 DIAGNOSIS — J45909 Unspecified asthma, uncomplicated: Secondary | ICD-10-CM | POA: Diagnosis not present

## 2019-05-31 DIAGNOSIS — D649 Anemia, unspecified: Secondary | ICD-10-CM | POA: Diagnosis not present

## 2019-05-31 DIAGNOSIS — D500 Iron deficiency anemia secondary to blood loss (chronic): Secondary | ICD-10-CM | POA: Diagnosis not present

## 2019-05-31 DIAGNOSIS — I10 Essential (primary) hypertension: Secondary | ICD-10-CM | POA: Diagnosis not present

## 2019-05-31 DIAGNOSIS — M810 Age-related osteoporosis without current pathological fracture: Secondary | ICD-10-CM | POA: Diagnosis not present

## 2019-05-31 DIAGNOSIS — E7800 Pure hypercholesterolemia, unspecified: Secondary | ICD-10-CM | POA: Diagnosis not present

## 2019-05-31 DIAGNOSIS — C50912 Malignant neoplasm of unspecified site of left female breast: Secondary | ICD-10-CM | POA: Diagnosis not present

## 2019-06-05 DIAGNOSIS — R350 Frequency of micturition: Secondary | ICD-10-CM | POA: Diagnosis not present

## 2019-06-12 DIAGNOSIS — Z79891 Long term (current) use of opiate analgesic: Secondary | ICD-10-CM | POA: Diagnosis not present

## 2019-06-12 DIAGNOSIS — M47812 Spondylosis without myelopathy or radiculopathy, cervical region: Secondary | ICD-10-CM | POA: Diagnosis not present

## 2019-06-12 DIAGNOSIS — M542 Cervicalgia: Secondary | ICD-10-CM | POA: Diagnosis not present

## 2019-06-12 DIAGNOSIS — Z79899 Other long term (current) drug therapy: Secondary | ICD-10-CM | POA: Diagnosis not present

## 2019-06-12 DIAGNOSIS — Z5181 Encounter for therapeutic drug level monitoring: Secondary | ICD-10-CM | POA: Diagnosis not present

## 2019-06-24 ENCOUNTER — Other Ambulatory Visit: Payer: Self-pay | Admitting: Hematology and Oncology

## 2019-06-24 DIAGNOSIS — C50412 Malignant neoplasm of upper-outer quadrant of left female breast: Secondary | ICD-10-CM

## 2019-08-11 ENCOUNTER — Ambulatory Visit: Payer: Medicare Other | Attending: Internal Medicine

## 2019-08-11 DIAGNOSIS — Z23 Encounter for immunization: Secondary | ICD-10-CM

## 2019-08-11 NOTE — Progress Notes (Signed)
   Covid-19 Vaccination Clinic  Name:  Brandy [name]    MRN: [id number] DOB: Dec 10, 1932  08/11/2019  [name] was observed post Covid-19 immunization for 15 minutes without incidence. She was provided with Vaccine Information Sheet and instruction to access the V-Safe system.   [name] was instructed to call 911 with any severe reactions post vaccine: Marland Kitchen Difficulty breathing  . Swelling of your face and throat  . A fast heartbeat  . A bad rash all over your body  . Dizziness and weakness    Immunizations Administered    Name Date Dose VIS Date Route   Pfizer COVID-19 Vaccine 08/11/2019  3:30 PM 0.3 mL 06/16/2019 Intramuscular   Manufacturer: [place]   Lot: [id number]   [place]: [id number]

## 2019-08-23 ENCOUNTER — Telehealth: Payer: Self-pay | Admitting: *Deleted

## 2019-08-23 NOTE — Telephone Encounter (Signed)
Virtual Visit Pre-Appointment Phone Call  "(Name), I am calling you today to discuss your upcoming appointment. We are currently trying to limit exposure to the virus that causes COVID-19 by seeing patients at home rather than in the office."  1. "What is the BEST phone number to call the day of the visit?" - include this in appointment notes  2. "Do you have or have access to (through a family member/friend) a smartphone with video capability that we can use for your visit?" a. If no - list the appointment type as a PHONE visit in appointment notes-YES UPDATED  3. Confirm consent - "In the setting of the current Covid19 crisis, you are scheduled for a (phone or video) visit with your provider on (date) at (time).  Just as we do with many in-office visits, in order for you to participate in this visit, we must obtain consent.  If you'd like, I can send this to your mychart (if signed up) or email for you to review.  Otherwise, I can obtain your verbal consent now.  All virtual visits are billed to your insurance company just like a normal visit would be.  By agreeing to a virtual visit, we'd like you to understand that the technology does not allow for your provider to perform an examination, and thus may limit your provider's ability to fully assess your condition. If your provider identifies any concerns that need to be evaluated in person, we will make arrangements to do so.  Finally, though the technology is pretty good, we cannot assure that it will always work on either your or our end, and in the setting of a video visit, we may have to convert it to a phone-only visit.  In either situation, we cannot ensure that we have a secure connection.  Are you willing to proceed?" STAFF: Did the patient verbally acknowledge consent to telehealth visit? Document YES/NO here: PT GAVE VERBAL CONSENT OVER THE PHONE FOR DR. Meda [name] TO TREAT HER VIA TELEPHONE VISIT ON 08/24/19 AT 0920.  4. Advise patient to be  prepared - "Two hours prior to your appointment, go ahead and check your blood pressure, pulse, oxygen saturation, and your weight (if you have the equipment to check those) and write them all down. When your visit starts, your provider will ask you for this information. If you have an Apple Watch or Kardia device, please plan to have heart rate information ready on the day of your appointment. Please have a pen and paper handy nearby the day of the visit as well."-YES [place]   5. Inform patient they will receive a phone call 15 minutes prior to their appointment time (may be from unknown caller ID) so they should be prepared to [place] has been deemed a candidate for a follow-up tele-health visit to limit community exposure during the Covid-19 pandemic. I spoke with the patient via phone to ensure availability of phone/video source, confirm preferred email & phone number, and discuss instructions and expectations.  I reminded Brandy [name] to be prepared with any vital sign and/or heart rhythm information that could potentially be obtained via home monitoring, at the time of her visit. I reminded Brandy [name] to expect a phone call prior to her visit.  Brandy Alpha, LPN [id number] PM      FULL LENGTH CONSENT FOR TELE-HEALTH VISIT   I hereby voluntarily request, consent and  authorize [facility] and its employed or contracted physicians, Engineer, materials, nurse practitioners or other licensed health care professionals (the Practitioner), to provide me with telemedicine health care services (the "Services") as deemed necessary by the treating Practitioner. I acknowledge and consent to receive the Services by the Practitioner via telemedicine. I understand that the telemedicine visit will involve communicating with the Practitioner through live audiovisual communication technology and the disclosure of certain  medical information by electronic transmission. I acknowledge that I have been given the opportunity to request an in-person assessment or other available alternative prior to the telemedicine visit and am voluntarily participating in the telemedicine visit.  I understand that I have the right to withhold or withdraw my consent to the use of telemedicine in the course of my care at any time, without affecting my right to future care or treatment, and that the Practitioner or I may terminate the telemedicine visit at any time. I understand that I have the right to inspect all information obtained and/or recorded in the course of the telemedicine visit and may receive copies of available information for a reasonable fee.  I understand that some of the potential risks of receiving the Services via telemedicine include:  Marland Kitchen Delay or interruption in medical evaluation due to technological equipment failure or disruption; . Information transmitted may not be sufficient (e.g. poor resolution of images) to allow for appropriate medical decision making by the Practitioner; and/or  . In rare instances, security protocols could fail, causing a breach of personal health information.  Furthermore, I acknowledge that it is my responsibility to provide information about my medical history, conditions and care that is complete and accurate to the best of my ability. I acknowledge that Practitioner's advice, recommendations, and/or decision may be based on factors not within their control, such as incomplete or inaccurate data provided by me or distortions of diagnostic images or specimens that may result from electronic transmissions. I understand that the practice of medicine is not an exact science and that Practitioner makes no warranties or guarantees regarding treatment outcomes. I acknowledge that I will receive a copy of this consent concurrently upon execution via email to the email address I last provided but may  also request a printed copy by calling the office of [facility].    I understand that my insurance will be billed for this visit.   I have read or had this consent read to me. . I understand the contents of this consent, which adequately explains the benefits and risks of the Services being provided via telemedicine.  . I have been provided ample opportunity to ask questions regarding this consent and the Services and have had my questions answered to my satisfaction. . I give my informed consent for the services to be provided through the use of telemedicine in my medical care  By participating in this telemedicine visit I agree to the above.  PT GAVE VERBAL CONSENT FOR DR. Meda [name] TO TREAT HER VIA TELEPHONE VISIT ON 08/24/19 AT 0920.

## 2019-08-24 ENCOUNTER — Encounter: Payer: Self-pay | Admitting: *Deleted

## 2019-08-24 ENCOUNTER — Telehealth (INDEPENDENT_AMBULATORY_CARE_PROVIDER_SITE_OTHER): Payer: Medicare Other | Admitting: Cardiology

## 2019-08-24 ENCOUNTER — Other Ambulatory Visit: Payer: Self-pay

## 2019-08-24 VITALS — BP 125/66 | Ht 59.0 in | Wt 137.0 lb

## 2019-08-24 DIAGNOSIS — I5032 Chronic diastolic (congestive) heart failure: Secondary | ICD-10-CM

## 2019-08-24 DIAGNOSIS — I350 Nonrheumatic aortic (valve) stenosis: Secondary | ICD-10-CM | POA: Diagnosis not present

## 2019-08-24 DIAGNOSIS — I110 Hypertensive heart disease with heart failure: Secondary | ICD-10-CM | POA: Diagnosis not present

## 2019-08-24 DIAGNOSIS — I10 Essential (primary) hypertension: Secondary | ICD-10-CM

## 2019-08-24 NOTE — Addendum Note (Signed)
Addended by: [name] on: 08/24/2019 10:28 AM   Modules accepted: Orders

## 2019-08-24 NOTE — Progress Notes (Signed)
Virtual Visit via Telephone Note   This visit type was conducted due to national recommendations for restrictions regarding the COVID-19 Pandemic (e.g. social distancing) in an effort to limit this patient's exposure and mitigate transmission in our community.  Due to her co-morbid illnesses, this patient is at least at moderate risk for complications without adequate follow up.  This format is felt to be most appropriate for this patient at this time.  The patient did not have access to video technology/had technical difficulties with video requiring transitioning to audio format only (telephone).  All issues noted in this document were discussed and addressed.  No physical exam could be performed with this format.  Please refer to the patient's chart for her  consent to telehealth for [facility].   Date:  08/24/2019   ID:  Brandy [name], DOB 01/20/1933, MRN [id number]  Patient Location: Home Provider Location: Home  PCP:  Kathyrn Lass, MD  Cardiologist:  Ena Dawley, MD  Evaluation Performed:  Follow-Up Visit  Chief Complaint:  DOE  History of Present Illness:    Brandy [name] is a 86 y.o. female with history of HTN, HLD invasive ductal carcinoma of her left breast 2013. Saw Dr. Meda Coffee 09/2016 with dyspnea on exertion and abnormal echo showing moderate aortic stenosis and mild AI. EKG was also abnormal with T-wave in inversion inferiorly which more pronounced from prior EKGs. Repeat 2-D echo 10/05/16 showed moderate aortic stenosis LVEF 60-65%. Medical therapy was recommended.If severe AS,TAVR referral was recommended  Patient was hospitalized in May 2018 with incarcerated incisional hernia status post resection and repair. She had a Lexi scan Myoview 11/17/16 preop that was normal without ischemia, LVEF 71%. Her Diovan/HCTZ and spironolactone were held at discharge because of low blood pressures. Spironolactone has since been restarted at 25 mg once daily. She also suffered from  pneumonia and asthma after hospitalization.  I saw the patient 01/19/17 complaining of dyspnea on exertion with little activity. Her blood pressure remained low. She had lost 17 pounds since surgery. She had acute on chronic diastolic CHF with edema on exam. I increased her Norvasc to 5 mg once daily and added back HCTZ 25 mg once a day. I did not restart Diovan. BUN and creatinine were 21/0.95, BNP 247.  08/24/2019 - this is a followed up in 3 years, the patient underwent hip replacement and hernia repair without complications. She is able to walk and denies any chest pain, no orthostatic hypotension, but has some DOE. No falls. No LE edema, no myalgias with atorvastatin.   The patient does not have symptoms concerning for COVID-19 infection (fever, chills, cough, or new shortness of breath).   Past Medical History:  Diagnosis Date  . Anemia   . Arthritis    shoulders  . Asthma    mild, no inhalers used  . Breast cancer ([place]) 12/18/11   left breast masectomy=metastatic ca in (1/1) lymph node ,invasive ductal ca,2 foci,,dcis,lymph ovascular invasion identified,surgical resection margins neg for ca,additional tissue=benign skin and subcutaneous tissue  . Bronchitis    hx of;last time >33yr ago  . Depression    takes Celexa daily  . Difficult intubation   . Dysphagia   . Full dentures   . Gall stones 2015  . GERD (gastroesophageal reflux disease)    takes Prilosec prn  . H/O hiatal hernia   . History of kidney stones    many yrs ago  . Hx of radiation therapy 04/26/12 -06/10/12   left  breast  . Hyperlipidemia    takes Simvastatin daily  . Hypertension    takes Amlodipine and Diovan daily  . Insomnia    takes Ambien nightly  . Urinary urgency    Past Surgical History:  Procedure Laterality Date  . APPENDECTOMY    . bladder tack    . BREAST BIOPSY  1998   left   . DILATION AND CURETTAGE OF UTERUS    . ENDOSCOPIC RETROGRADE CHOLANGIOPANCREATOGRAPHY (ERCP) WITH PROPOFOL N/A  02/01/2014   Procedure: ENDOSCOPIC RETROGRADE CHOLANGIOPANCREATOGRAPHY (ERCP) WITH PROPOFOL;  Surgeon: [name], MD;  Location: [facility];  Service: Endoscopy;  Laterality: N/A;  . ESOPHAGOGASTRODUODENOSCOPY    . ESOPHAGOGASTRODUODENOSCOPY N/A 06/26/2016   Procedure: ESOPHAGOGASTRODUODENOSCOPY (EGD);  Surgeon: [name], MD;  Location: [place];  Service: Endoscopy;  Laterality: N/A;  . ESOPHAGOGASTRODUODENOSCOPY (EGD) WITH PROPOFOL  12/19/2013   Procedure: ESOPHAGOGASTRODUODENOSCOPY (EGD) WITH PROPOFOL;  Surgeon: Beryle Beams, MD;  Location: [place];  Service: Endoscopy;;  . ESOPHAGOGASTRODUODENOSCOPY (EGD) WITH PROPOFOL N/A 09/10/2016   Procedure: ESOPHAGOGASTRODUODENOSCOPY (EGD) WITH PROPOFOL;  Surgeon: [name], MD;  [facility]: [facility];  Service: Endoscopy;  Laterality: N/A;  . EUS  06/04/2011   Procedure: UPPER ENDOSCOPIC ULTRASOUND (EUS) LINEAR;  Surgeon: Owens Loffler, MD;  Location: [facility];  Service: Endoscopy;  Laterality: N/A;  . EUS N/A 02/01/2014   Procedure: UPPER ENDOSCOPIC ULTRASOUND (EUS) LINEAR;  Surgeon: [name], MD;  [facility]: [facility];  Service: Endoscopy;  Laterality: N/A;  . EXPLORATORY LAPAROTOMY      biopsy of intra-abdominal mass  . INTRAMEDULLARY (IM) NAIL INTERTROCHANTERIC Right 10/24/2018   Procedure: INTRAMEDULLARY (IM) NAIL INTERTROCHANTRIC;  Surgeon: Gaynelle Arabian, MD;  Location: [facility];  Service: Orthopedics;  Laterality: Right;  . LAPAROTOMY N/A 11/17/2016   Procedure: EXPLORATORY LAPAROTOMY WITH LYSIS OF ADHESIONS, SMALL BOWEL RESECTION, REPAIR OF INCARCERATED VENTRAL INCISIONAL HERNIA, INSERTION OF BIOLOGIC MESH PATCH,APPLICATION OF WOUND VAC DRESSING;  Surgeon: Armandina Gemma, MD;  Location: [facility];  Service: General;  Laterality: N/A;  . MASTECTOMY W/ SENTINEL NODE BIOPSY  12/18/2011   Procedure: MASTECTOMY WITH SENTINEL LYMPH NODE BIOPSY;  Surgeon: Rolm Bookbinder, MD;  Location: [place];  Service: General;   Laterality: Left;  . PORT-A-CATH REMOVAL Right 06/15/2013   Procedure: REMOVAL PORT-A-CATH;  Surgeon: Rolm Bookbinder, MD;  Location: [place];  Service: General;  Laterality: Right;  . PORTACATH PLACEMENT  01/27/2012   Procedure: INSERTION PORT-A-CATH;  Surgeon: Rolm Bookbinder, MD;  Location: [place];  Service: General;  Laterality: Right;  PORT PLACEMENT  . TOTAL HIP ARTHROPLASTY Right 11/14/2018   Procedure: REMOVAL OF INTRAMEDULLARY NAIL AND POSTERIOR TOTAL HIP ARTHROPLASTY;  Surgeon: Gaynelle Arabian, MD;  Location: [facility];  Service: Orthopedics;  Laterality: Right;  . TOTAL SHOULDER REPLACEMENT  2011   left  . TUBAL LIGATION       Current Meds  Medication Sig  . acetaminophen (TYLENOL) 500 MG tablet Take 1,000 mg by mouth every 6 (six) hours as needed (pain).  Marland Kitchen atorvastatin (LIPITOR) 40 MG tablet Take 40 mg by mouth daily.   . citalopram (CELEXA) 20 MG tablet Take 1 tablet (20 mg total) by mouth daily.  . Cyanocobalamin (VITAMIN B-12 PO) Take 1 tablet by mouth daily.  . ferrous sulfate 325 (65 FE) MG tablet Take 1 tablet (325 mg total) by mouth 2 (two) times daily with a meal. (Patient taking differently: Take 325 mg by mouth daily. )  . gabapentin (NEURONTIN) 300 MG  capsule TAKE 1 CAPSULE BY MOUTH EVERYDAY AT BEDTIME  . Multiple Vitamin (MULTIVITAMIN WITH MINERALS) TABS tablet Take 1 tablet by mouth daily.  Marland Kitchen omeprazole (PRILOSEC) 40 MG capsule TAKE 1 CAPSULE (40 MG TOTAL) BY MOUTH 2 (TWO) TIMES DAILY BEFORE A MEAL.  Marland Kitchen phenol (CHLORASEPTIC) 1.4 % LIQD Use as directed 1 spray in the mouth or throat as needed for throat irritation / pain.  . traMADol (ULTRAM) 50 MG tablet Take 2 tablets (100 mg total) by mouth 4 (four) times daily.  . valsartan-hydrochlorothiazide (DIOVAN-HCT) 80-12.5 MG tablet Take 1 tablet by mouth daily.  . Vitamin D, Ergocalciferol, (DRISDOL) 50000 units CAPS capsule Take 50,000 Units by mouth every Thursday.     Allergies:    Patient has no known allergies.   Social History   Tobacco Use  . Smoking status: Never Smoker  . Smokeless tobacco: Never Used  Substance Use Topics  . Alcohol use: No  . Drug use: No    Family Hx: The patient's family history includes Breast cancer in an other family member; Prostate cancer in her father.  ROS:   Please see the history of present illness.    All other systems reviewed and are negative.  Prior CV studies:   The following studies were reviewed today:  10/05/2016  Left ventricle: The cavity size was normal. Wall thickness was  normal. Systolic function was normal. The estimated ejection  fraction was in the range of 60% to 65%. Wall motion was normal;  there were no regional wall motion abnormalities. Doppler  parameters are consistent with abnormal left ventricular  relaxation (grade 1 diastolic dysfunction). Doppler parameters  are consistent with elevated mean left atrial filling pressure.  - Aortic valve: Valve mobility was moderately restricted. There was  moderate stenosis. There was trivial regurgitation. Mean gradient  (S): 25 mm Hg. Peak gradient (S): 43 mm Hg. Valve area (VTI):  1.37 cm^2. Valve area (Vmax): 1.39 cm^2. Valve area (Vmean): 1.35  cm^2.  - Mitral valve: Moderately to severely calcified annulus. Mildly  thickened leaflets . Mean gradient (D): 3 mm Hg. Peak gradient  (D): 9 mm Hg. Valve area by pressure half-time: 2.34 cm^2. Valve  area by continuity equation (using LVOT flow): 2.64 cm^2.  - Left atrium: The atrium was mildly dilated.   Labs/Other Tests and Data Reviewed:    EKG:  No ECG reviewed.  Recent Labs: 10/30/2018: ALT 15 10/31/2018: Magnesium 2.0 11/16/2018: BUN 16; Creatinine, Ser 0.60; Potassium 4.7; Sodium 136 11/17/2018: Hemoglobin 9.2; Platelets 249   Recent Lipid Panel Lab Results  Component Value Date/Time   TRIG 295 (H) 10/29/2018 01:40 PM   Wt Readings from Last 3 Encounters:  08/24/19  137 lb (62.1 kg)  02/03/19 137 lb 3.2 oz (62.2 kg)  11/14/18 145 lb (65.8 kg)    Objective:    Vital Signs:  BP 125/66   Ht 4\' 11"  (1.499 m)   Wt 137 lb (62.1 kg)   BMI 27.67 kg/m    VITAL SIGNS:  reviewed     ASSESSMENT & PLAN:    Chronic diastolic CHF improved with HCTZ and spironolactone.  - we will obtain labs at the next visit, Crea normal in 11/2018  Moderate aortic stenosis on echo 10/2016.  - we will repeat now  Essential hypertension  - well controlled  COVID-19 Education: The signs and symptoms of COVID-19 were discussed with the patient and how to seek care for testing (follow up with PCP or arrange E-visit).  The importance of social distancing was discussed today.  Time:   Today, I have spent 25 minutes with the patient with telehealth technology discussing the above problems.    Medication Adjustments/Labs and Tests Ordered: Current medicines are reviewed at length with the patient today.  Concerns regarding medicines are outlined above.   Tests Ordered: No orders of the defined types were placed in this encounter.  Medication Changes: No orders of the defined types were placed in this encounter.  Follow Up:  In Person in 4 month(s)  Signed, Ena Dawley, MD  08/24/2019 10:14 AM    [place]

## 2019-08-24 NOTE — Patient Instructions (Signed)
Medication Instructions:   Your physician recommends that you continue on your current medications as directed. Please refer to the Current Medication list given to you today.  *If you need a refill on your cardiac medications before your next appointment, please call your pharmacy*   Testing/Procedures:  Your physician has requested that you have an echocardiogram. Echocardiography is a painless test that uses sound waves to create images of your heart. It provides your doctor with information about the size and shape of your heart and how well your heart's chambers and valves are working. This procedure takes approximately one hour. There are no restrictions for this procedure. OUR SCHEDULING DEPARTMENT WILL BE CALLING YOU IN THE NEAR FUTURE TO SCHEDULE FOR THIS TEST TO BE DONE IN OUR OFFICE.    Follow-Up:  IN 4 MONTHS WITH DR. Meda Coffee IN THE OFFICE ON December 22, 2019 AT 9:20 AM--THIS WILL BE AN IN PERSON OFFICE VISIT

## 2019-09-05 ENCOUNTER — Ambulatory Visit: Payer: Medicare Other | Attending: Internal Medicine

## 2019-09-05 DIAGNOSIS — Z23 Encounter for immunization: Secondary | ICD-10-CM | POA: Insufficient documentation

## 2019-09-05 NOTE — Progress Notes (Signed)
   Covid-19 Vaccination Clinic  Name:  Brandy [name]    MRN: [id number] DOB: Dec 16, 1932  09/05/2019  [name] was observed post Covid-19 immunization for 15 minutes without incident. She was provided with Vaccine Information Sheet and instruction to access the V-Safe system.   [name] was instructed to call 911 with any severe reactions post vaccine: Marland Kitchen Difficulty breathing  . Swelling of face and throat  . A fast heartbeat  . A bad rash all over body  . Dizziness and weakness   Immunizations Administered    Name Date Dose VIS Date Route   Pfizer COVID-19 Vaccine 09/05/2019  2:38 PM 0.3 mL 06/16/2019 Intramuscular   Manufacturer: [employer]   Lot: [id number]   [place]: [id number]

## 2019-09-08 ENCOUNTER — Other Ambulatory Visit: Payer: Self-pay

## 2019-09-08 ENCOUNTER — Ambulatory Visit (HOSPITAL_COMMUNITY): Payer: Medicare Other | Attending: Internal Medicine

## 2019-09-08 DIAGNOSIS — I350 Nonrheumatic aortic (valve) stenosis: Secondary | ICD-10-CM | POA: Diagnosis not present

## 2019-09-08 DIAGNOSIS — I10 Essential (primary) hypertension: Secondary | ICD-10-CM | POA: Insufficient documentation

## 2019-09-08 DIAGNOSIS — I5032 Chronic diastolic (congestive) heart failure: Secondary | ICD-10-CM | POA: Diagnosis not present

## 2019-09-20 ENCOUNTER — Other Ambulatory Visit: Payer: Self-pay | Admitting: Hematology and Oncology

## 2019-09-20 DIAGNOSIS — C50412 Malignant neoplasm of upper-outer quadrant of left female breast: Secondary | ICD-10-CM

## 2019-12-22 ENCOUNTER — Ambulatory Visit: Payer: Medicare Other | Admitting: Cardiology

## 2019-12-27 ENCOUNTER — Other Ambulatory Visit: Payer: Self-pay | Admitting: Hematology and Oncology

## 2019-12-27 DIAGNOSIS — C50412 Malignant neoplasm of upper-outer quadrant of left female breast: Secondary | ICD-10-CM

## 2020-01-02 DIAGNOSIS — G894 Chronic pain syndrome: Secondary | ICD-10-CM | POA: Diagnosis not present

## 2020-01-02 DIAGNOSIS — M5030 Other cervical disc degeneration, unspecified cervical region: Secondary | ICD-10-CM | POA: Diagnosis not present

## 2020-01-02 DIAGNOSIS — M47812 Spondylosis without myelopathy or radiculopathy, cervical region: Secondary | ICD-10-CM | POA: Diagnosis not present

## 2020-01-25 DIAGNOSIS — H2513 Age-related nuclear cataract, bilateral: Secondary | ICD-10-CM | POA: Diagnosis not present

## 2020-01-25 DIAGNOSIS — H353131 Nonexudative age-related macular degeneration, bilateral, early dry stage: Secondary | ICD-10-CM | POA: Diagnosis not present

## 2020-01-30 DIAGNOSIS — M47812 Spondylosis without myelopathy or radiculopathy, cervical region: Secondary | ICD-10-CM | POA: Diagnosis not present

## 2020-01-31 DIAGNOSIS — R6889 Other general symptoms and signs: Secondary | ICD-10-CM | POA: Diagnosis not present

## 2020-02-13 DIAGNOSIS — R1084 Generalized abdominal pain: Secondary | ICD-10-CM | POA: Diagnosis not present

## 2020-02-13 DIAGNOSIS — K5900 Constipation, unspecified: Secondary | ICD-10-CM | POA: Diagnosis not present

## 2020-02-14 DIAGNOSIS — T8189XS Other complications of procedures, not elsewhere classified, sequela: Secondary | ICD-10-CM | POA: Diagnosis not present

## 2020-02-14 DIAGNOSIS — K5900 Constipation, unspecified: Secondary | ICD-10-CM | POA: Diagnosis not present

## 2020-02-14 DIAGNOSIS — Z8719 Personal history of other diseases of the digestive system: Secondary | ICD-10-CM | POA: Diagnosis not present

## 2020-02-14 DIAGNOSIS — Z9889 Other specified postprocedural states: Secondary | ICD-10-CM | POA: Diagnosis not present

## 2020-02-24 DIAGNOSIS — C50912 Malignant neoplasm of unspecified site of left female breast: Secondary | ICD-10-CM | POA: Diagnosis not present

## 2020-02-24 DIAGNOSIS — E7800 Pure hypercholesterolemia, unspecified: Secondary | ICD-10-CM | POA: Diagnosis not present

## 2020-02-24 DIAGNOSIS — I10 Essential (primary) hypertension: Secondary | ICD-10-CM | POA: Diagnosis not present

## 2020-02-24 DIAGNOSIS — D500 Iron deficiency anemia secondary to blood loss (chronic): Secondary | ICD-10-CM | POA: Diagnosis not present

## 2020-02-24 DIAGNOSIS — D649 Anemia, unspecified: Secondary | ICD-10-CM | POA: Diagnosis not present

## 2020-03-20 DIAGNOSIS — D649 Anemia, unspecified: Secondary | ICD-10-CM | POA: Diagnosis not present

## 2020-03-20 DIAGNOSIS — I10 Essential (primary) hypertension: Secondary | ICD-10-CM | POA: Diagnosis not present

## 2020-03-20 DIAGNOSIS — C50912 Malignant neoplasm of unspecified site of left female breast: Secondary | ICD-10-CM | POA: Diagnosis not present

## 2020-03-20 DIAGNOSIS — E7800 Pure hypercholesterolemia, unspecified: Secondary | ICD-10-CM | POA: Diagnosis not present

## 2020-03-20 DIAGNOSIS — D500 Iron deficiency anemia secondary to blood loss (chronic): Secondary | ICD-10-CM | POA: Diagnosis not present

## 2020-03-26 ENCOUNTER — Other Ambulatory Visit: Payer: Self-pay | Admitting: Hematology and Oncology

## 2020-03-26 DIAGNOSIS — C50412 Malignant neoplasm of upper-outer quadrant of left female breast: Secondary | ICD-10-CM

## 2020-04-02 DIAGNOSIS — M795 Residual foreign body in soft tissue: Secondary | ICD-10-CM | POA: Diagnosis not present

## 2020-04-05 DIAGNOSIS — Z79891 Long term (current) use of opiate analgesic: Secondary | ICD-10-CM | POA: Diagnosis not present

## 2020-04-05 DIAGNOSIS — M47812 Spondylosis without myelopathy or radiculopathy, cervical region: Secondary | ICD-10-CM | POA: Diagnosis not present

## 2020-04-09 DIAGNOSIS — M79671 Pain in right foot: Secondary | ICD-10-CM | POA: Diagnosis not present

## 2020-04-09 DIAGNOSIS — M79672 Pain in left foot: Secondary | ICD-10-CM | POA: Diagnosis not present

## 2020-04-09 DIAGNOSIS — L600 Ingrowing nail: Secondary | ICD-10-CM | POA: Diagnosis not present

## 2020-04-15 DIAGNOSIS — E7800 Pure hypercholesterolemia, unspecified: Secondary | ICD-10-CM | POA: Diagnosis not present

## 2020-04-15 DIAGNOSIS — I10 Essential (primary) hypertension: Secondary | ICD-10-CM | POA: Diagnosis not present

## 2020-04-15 DIAGNOSIS — D649 Anemia, unspecified: Secondary | ICD-10-CM | POA: Diagnosis not present

## 2020-04-15 DIAGNOSIS — D500 Iron deficiency anemia secondary to blood loss (chronic): Secondary | ICD-10-CM | POA: Diagnosis not present

## 2020-04-15 DIAGNOSIS — C50912 Malignant neoplasm of unspecified site of left female breast: Secondary | ICD-10-CM | POA: Diagnosis not present

## 2020-04-25 DIAGNOSIS — L600 Ingrowing nail: Secondary | ICD-10-CM | POA: Diagnosis not present

## 2020-04-25 DIAGNOSIS — M79675 Pain in left toe(s): Secondary | ICD-10-CM | POA: Diagnosis not present

## 2020-04-25 DIAGNOSIS — B351 Tinea unguium: Secondary | ICD-10-CM | POA: Diagnosis not present

## 2020-04-25 DIAGNOSIS — M79674 Pain in right toe(s): Secondary | ICD-10-CM | POA: Diagnosis not present

## 2020-05-15 DIAGNOSIS — M8580 Other specified disorders of bone density and structure, unspecified site: Secondary | ICD-10-CM | POA: Diagnosis not present

## 2020-05-15 DIAGNOSIS — I10 Essential (primary) hypertension: Secondary | ICD-10-CM | POA: Diagnosis not present

## 2020-05-15 DIAGNOSIS — U071 COVID-19: Secondary | ICD-10-CM | POA: Diagnosis not present

## 2020-05-15 DIAGNOSIS — K219 Gastro-esophageal reflux disease without esophagitis: Secondary | ICD-10-CM | POA: Diagnosis not present

## 2020-05-15 DIAGNOSIS — E7800 Pure hypercholesterolemia, unspecified: Secondary | ICD-10-CM | POA: Diagnosis not present

## 2020-05-15 DIAGNOSIS — C50912 Malignant neoplasm of unspecified site of left female breast: Secondary | ICD-10-CM | POA: Diagnosis not present

## 2020-06-08 DIAGNOSIS — M47812 Spondylosis without myelopathy or radiculopathy, cervical region: Secondary | ICD-10-CM | POA: Diagnosis not present

## 2020-06-11 ENCOUNTER — Other Ambulatory Visit: Payer: Self-pay | Admitting: Hematology and Oncology

## 2020-06-11 DIAGNOSIS — C50412 Malignant neoplasm of upper-outer quadrant of left female breast: Secondary | ICD-10-CM

## 2020-06-17 DIAGNOSIS — M8580 Other specified disorders of bone density and structure, unspecified site: Secondary | ICD-10-CM | POA: Diagnosis not present

## 2020-06-17 DIAGNOSIS — K219 Gastro-esophageal reflux disease without esophagitis: Secondary | ICD-10-CM | POA: Diagnosis not present

## 2020-06-17 DIAGNOSIS — I10 Essential (primary) hypertension: Secondary | ICD-10-CM | POA: Diagnosis not present

## 2020-06-17 DIAGNOSIS — C50912 Malignant neoplasm of unspecified site of left female breast: Secondary | ICD-10-CM | POA: Diagnosis not present

## 2020-06-17 DIAGNOSIS — E7800 Pure hypercholesterolemia, unspecified: Secondary | ICD-10-CM | POA: Diagnosis not present

## 2020-07-23 DIAGNOSIS — M8580 Other specified disorders of bone density and structure, unspecified site: Secondary | ICD-10-CM | POA: Diagnosis not present

## 2020-07-23 DIAGNOSIS — E7800 Pure hypercholesterolemia, unspecified: Secondary | ICD-10-CM | POA: Diagnosis not present

## 2020-07-23 DIAGNOSIS — C50912 Malignant neoplasm of unspecified site of left female breast: Secondary | ICD-10-CM | POA: Diagnosis not present

## 2020-07-23 DIAGNOSIS — D649 Anemia, unspecified: Secondary | ICD-10-CM | POA: Diagnosis not present

## 2020-07-23 DIAGNOSIS — M810 Age-related osteoporosis without current pathological fracture: Secondary | ICD-10-CM | POA: Diagnosis not present

## 2020-07-23 DIAGNOSIS — K219 Gastro-esophageal reflux disease without esophagitis: Secondary | ICD-10-CM | POA: Diagnosis not present

## 2020-07-23 DIAGNOSIS — J45909 Unspecified asthma, uncomplicated: Secondary | ICD-10-CM | POA: Diagnosis not present

## 2020-07-23 DIAGNOSIS — E782 Mixed hyperlipidemia: Secondary | ICD-10-CM | POA: Diagnosis not present

## 2020-07-23 DIAGNOSIS — D500 Iron deficiency anemia secondary to blood loss (chronic): Secondary | ICD-10-CM | POA: Diagnosis not present

## 2020-07-23 DIAGNOSIS — I10 Essential (primary) hypertension: Secondary | ICD-10-CM | POA: Diagnosis not present

## 2020-08-22 DIAGNOSIS — M8580 Other specified disorders of bone density and structure, unspecified site: Secondary | ICD-10-CM | POA: Diagnosis not present

## 2020-08-22 DIAGNOSIS — I10 Essential (primary) hypertension: Secondary | ICD-10-CM | POA: Diagnosis not present

## 2020-08-22 DIAGNOSIS — M810 Age-related osteoporosis without current pathological fracture: Secondary | ICD-10-CM | POA: Diagnosis not present

## 2020-08-22 DIAGNOSIS — J45909 Unspecified asthma, uncomplicated: Secondary | ICD-10-CM | POA: Diagnosis not present

## 2020-08-22 DIAGNOSIS — K219 Gastro-esophageal reflux disease without esophagitis: Secondary | ICD-10-CM | POA: Diagnosis not present

## 2020-08-22 DIAGNOSIS — E7800 Pure hypercholesterolemia, unspecified: Secondary | ICD-10-CM | POA: Diagnosis not present

## 2020-08-22 DIAGNOSIS — E782 Mixed hyperlipidemia: Secondary | ICD-10-CM | POA: Diagnosis not present

## 2020-08-22 DIAGNOSIS — D500 Iron deficiency anemia secondary to blood loss (chronic): Secondary | ICD-10-CM | POA: Diagnosis not present

## 2020-08-22 DIAGNOSIS — D649 Anemia, unspecified: Secondary | ICD-10-CM | POA: Diagnosis not present

## 2020-09-05 ENCOUNTER — Other Ambulatory Visit: Payer: Self-pay | Admitting: Hematology and Oncology

## 2020-09-05 DIAGNOSIS — C50412 Malignant neoplasm of upper-outer quadrant of left female breast: Secondary | ICD-10-CM

## 2020-09-20 ENCOUNTER — Other Ambulatory Visit: Payer: Self-pay | Admitting: *Deleted

## 2020-09-20 DIAGNOSIS — C50412 Malignant neoplasm of upper-outer quadrant of left female breast: Secondary | ICD-10-CM

## 2020-09-20 MED ORDER — GABAPENTIN 300 MG PO CAPS: ORAL_CAPSULE | ORAL | 3 refills | Status: DC

## 2020-09-23 DIAGNOSIS — J45909 Unspecified asthma, uncomplicated: Secondary | ICD-10-CM | POA: Diagnosis not present

## 2020-09-23 DIAGNOSIS — E7800 Pure hypercholesterolemia, unspecified: Secondary | ICD-10-CM | POA: Diagnosis not present

## 2020-09-23 DIAGNOSIS — E782 Mixed hyperlipidemia: Secondary | ICD-10-CM | POA: Diagnosis not present

## 2020-09-23 DIAGNOSIS — D500 Iron deficiency anemia secondary to blood loss (chronic): Secondary | ICD-10-CM | POA: Diagnosis not present

## 2020-09-23 DIAGNOSIS — M810 Age-related osteoporosis without current pathological fracture: Secondary | ICD-10-CM | POA: Diagnosis not present

## 2020-09-23 DIAGNOSIS — D649 Anemia, unspecified: Secondary | ICD-10-CM | POA: Diagnosis not present

## 2020-09-23 DIAGNOSIS — I10 Essential (primary) hypertension: Secondary | ICD-10-CM | POA: Diagnosis not present

## 2020-09-23 DIAGNOSIS — C50912 Malignant neoplasm of unspecified site of left female breast: Secondary | ICD-10-CM | POA: Diagnosis not present

## 2020-09-23 DIAGNOSIS — K219 Gastro-esophageal reflux disease without esophagitis: Secondary | ICD-10-CM | POA: Diagnosis not present

## 2020-09-23 DIAGNOSIS — M8580 Other specified disorders of bone density and structure, unspecified site: Secondary | ICD-10-CM | POA: Diagnosis not present

## 2020-10-03 IMAGING — CR DG HIP (WITH OR WITHOUT PELVIS) 2-3V RIGHT
3 series · 3 of 3 positions shown · non-contrast
Comparison: Bone window images from CT abdomen and pelvis
11/15/2016.

CLINICAL DATA: 85-year-old who fell 3 days ago and complains of
persistent RIGHT hip and RIGHT UPPER leg pain. Initial encounter.

EXAM:
DG HIP (WITH OR WITHOUT PELVIS) 2-3V RIGHT

[x pelvis]
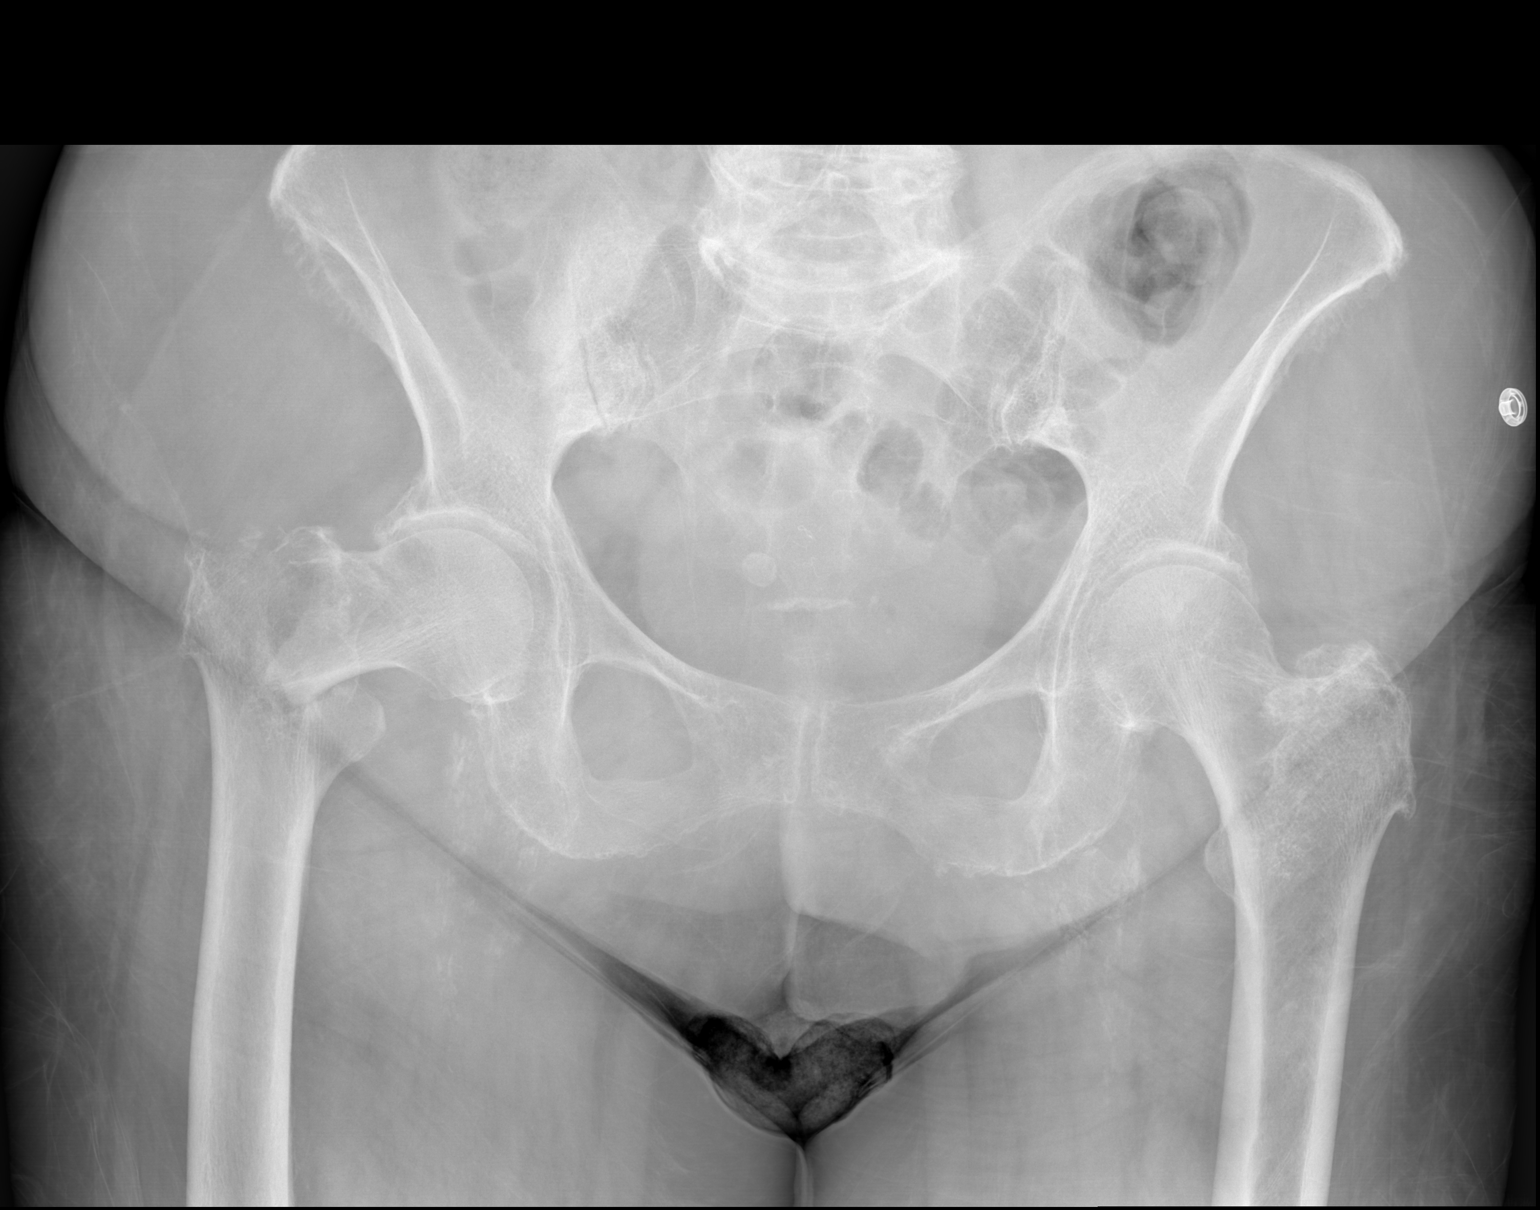

[x hip ap right]
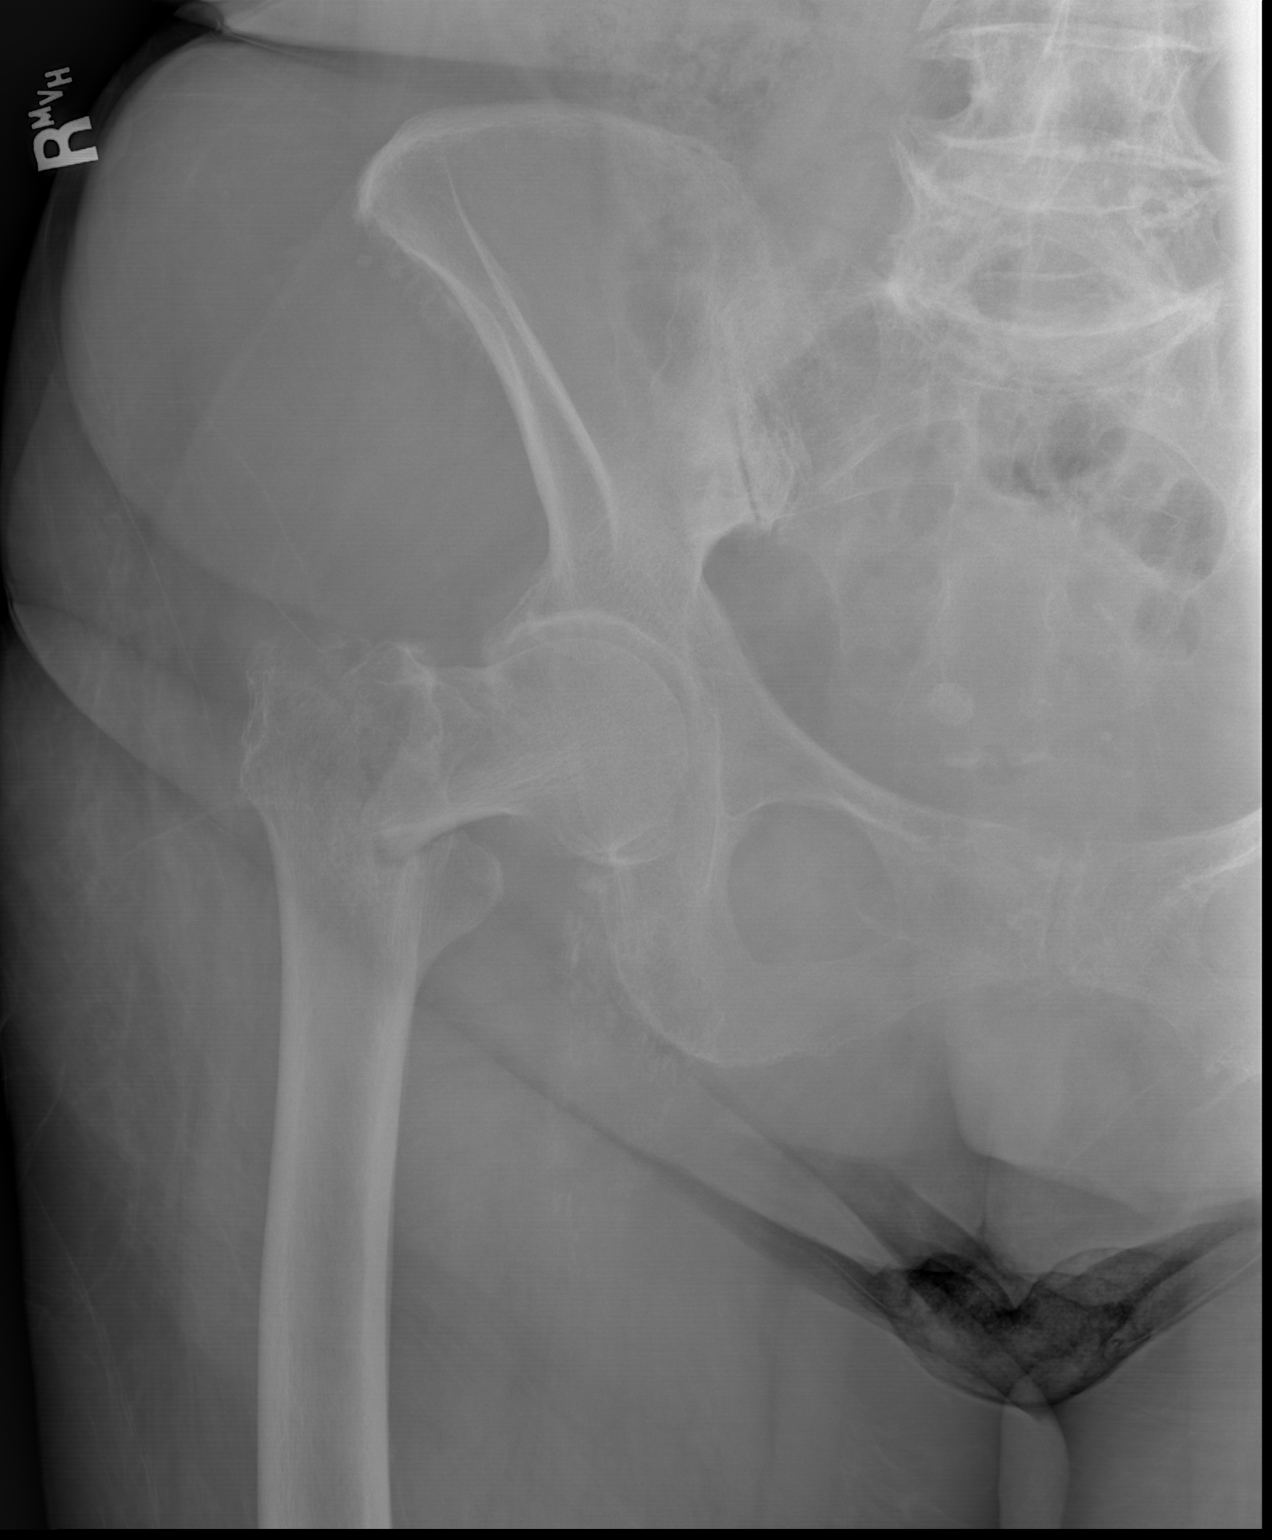

[w hip lat right]
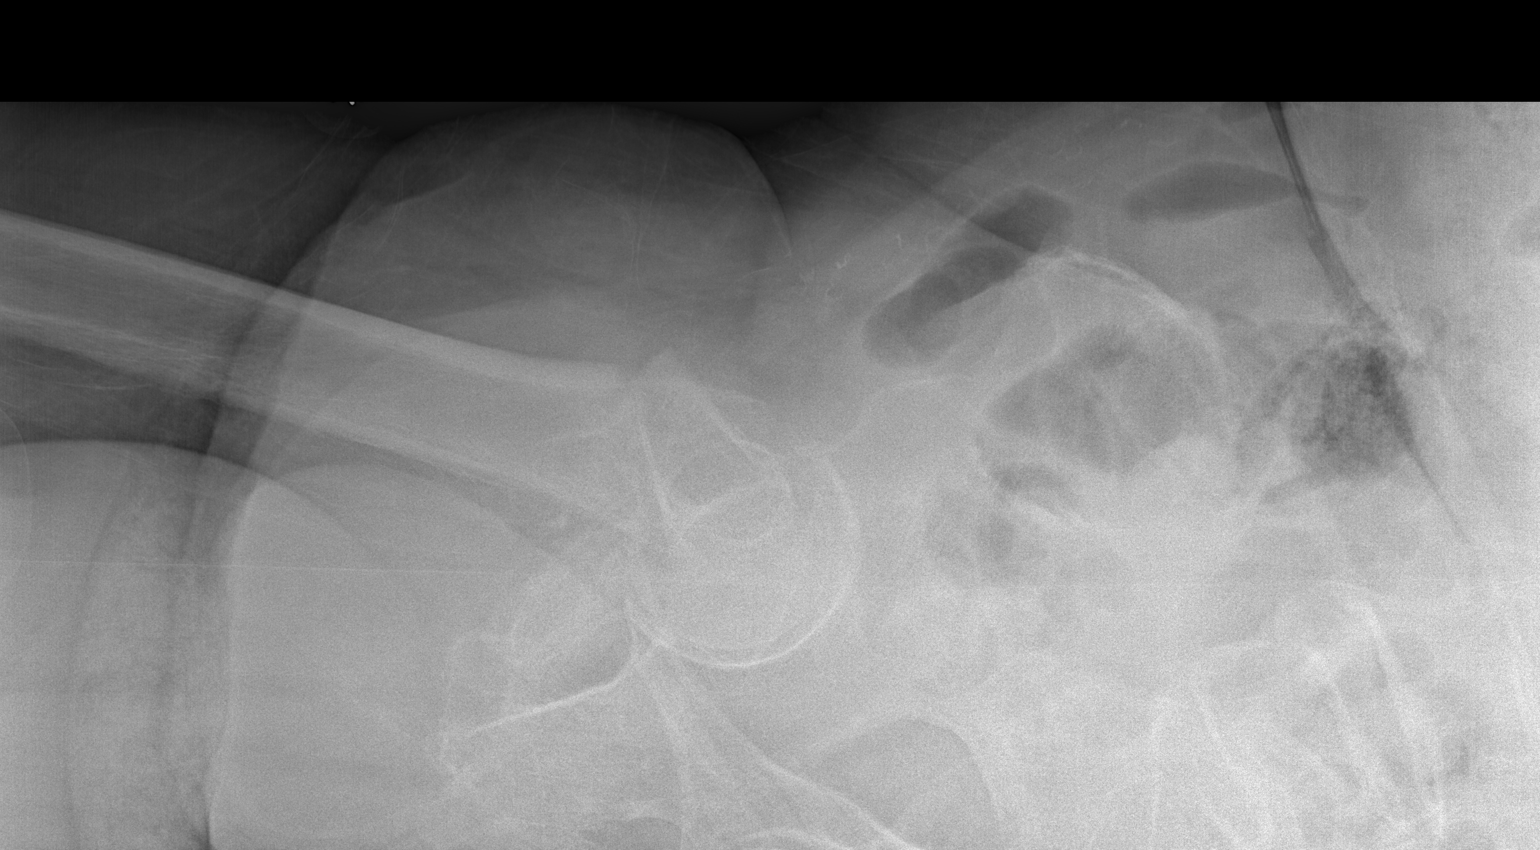

[3 of 3 positions shown; findings below may reference images not displayed]

FINDINGS: Acute comminuted intertrochanteric RIGHT femoral neck fracture.
RIGHT hip joint anatomically aligned with minimal to mild joint
space narrowing.

Symmetric minimal to mild joint space narrowing in the contralateral
LEFT hip. Sacroiliac joints and symphysis pubis anatomically aligned
without diastasis and demonstrating degenerative changes. Osseous
demineralization. Degenerative changes involving the visualized
LOWER lumbar spine.
IMPRESSION: Acute comminuted intertrochanteric RIGHT femoral neck fracture.

## 2020-10-03 IMAGING — CR RIGHT FEMUR 2 VIEWS
2 series · 2 of 2 positions shown · non-contrast
Comparison: None.

CLINICAL DATA: 85-year-old who fell 3 days ago and complains of
persistent RIGHT hip and RIGHT UPPER leg pain. Initial encounter.

EXAM:
RIGHT FEMUR 2 VIEWS

[x femur proximal ap right]
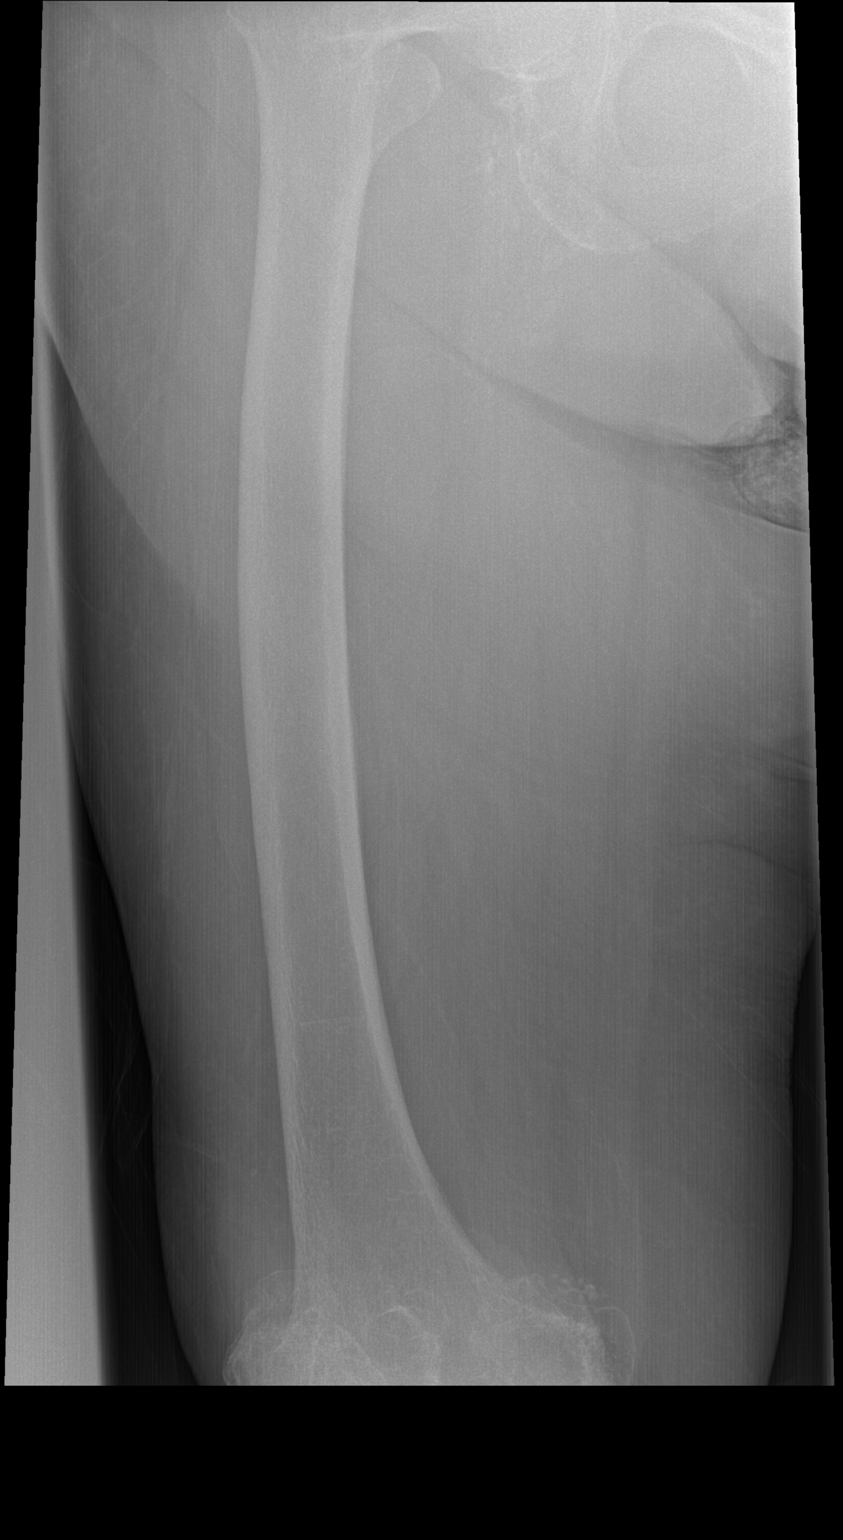

[w hip lat right]
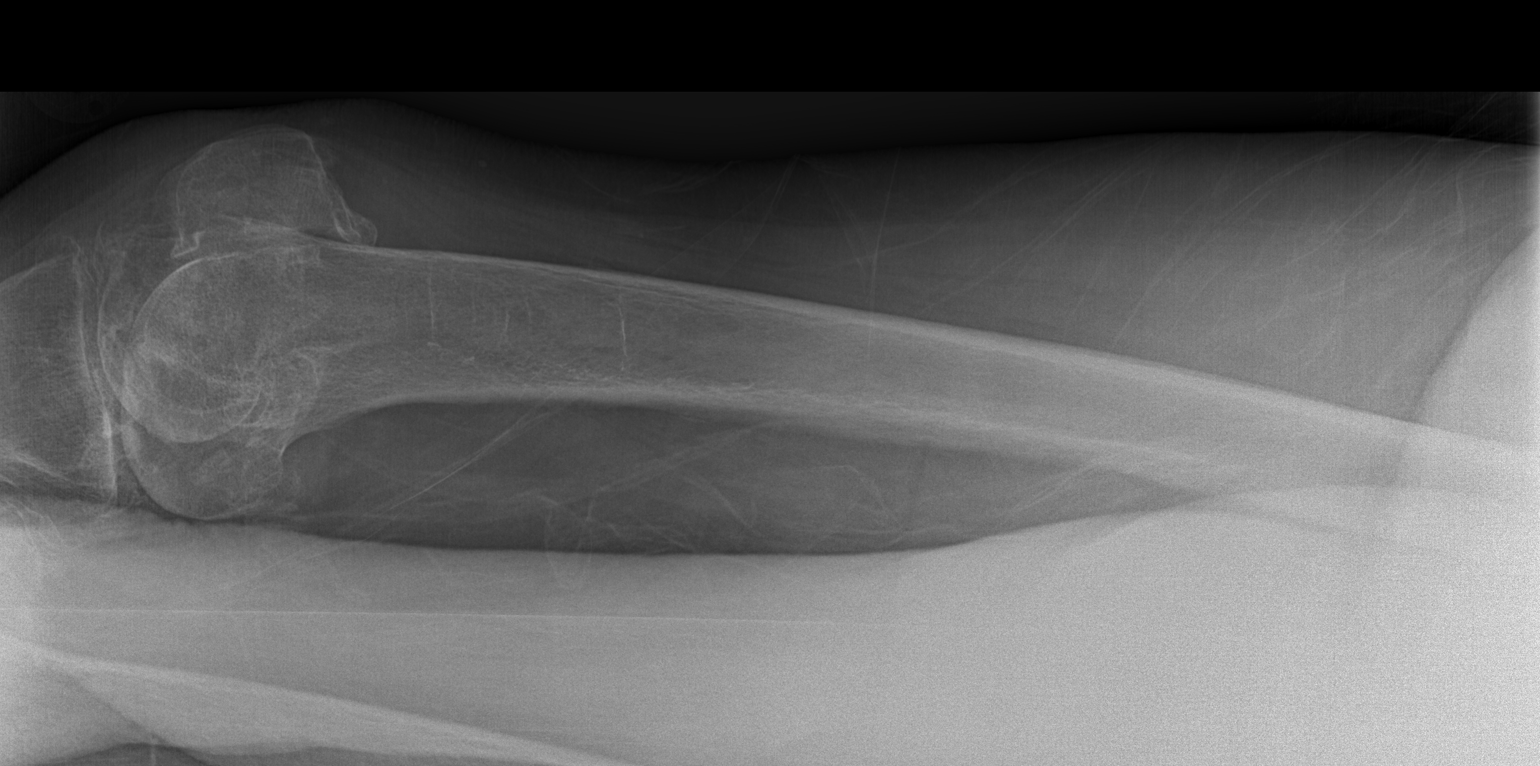

[2 of 2 positions shown; findings below may reference images not displayed]

These images are read in conjunction with the
RIGHT hip x-rays obtained concurrently.
FINDINGS: The intertrochanteric RIGHT femoral neck fracture is not included on
these images. No fractures elsewhere involving the RIGHT femur.
Osseous demineralization. Severe tricompartment osteoarthritis
involving the knee.
IMPRESSION: 1. No fractures elsewhere involving the RIGHT femur.
2. Osseous demineralization.
3. Severe tricompartment osteoarthritis involving the knee.

## 2020-10-15 ENCOUNTER — Other Ambulatory Visit: Payer: Self-pay | Admitting: Hematology and Oncology

## 2020-10-15 DIAGNOSIS — C50412 Malignant neoplasm of upper-outer quadrant of left female breast: Secondary | ICD-10-CM

## 2020-10-17 DIAGNOSIS — J45909 Unspecified asthma, uncomplicated: Secondary | ICD-10-CM | POA: Diagnosis not present

## 2020-10-17 DIAGNOSIS — I10 Essential (primary) hypertension: Secondary | ICD-10-CM | POA: Diagnosis not present

## 2020-10-17 DIAGNOSIS — E782 Mixed hyperlipidemia: Secondary | ICD-10-CM | POA: Diagnosis not present

## 2020-10-17 DIAGNOSIS — M810 Age-related osteoporosis without current pathological fracture: Secondary | ICD-10-CM | POA: Diagnosis not present

## 2020-10-17 DIAGNOSIS — K219 Gastro-esophageal reflux disease without esophagitis: Secondary | ICD-10-CM | POA: Diagnosis not present

## 2020-10-17 DIAGNOSIS — D649 Anemia, unspecified: Secondary | ICD-10-CM | POA: Diagnosis not present

## 2020-10-17 DIAGNOSIS — M8580 Other specified disorders of bone density and structure, unspecified site: Secondary | ICD-10-CM | POA: Diagnosis not present

## 2020-10-29 DIAGNOSIS — Z0000 Encounter for general adult medical examination without abnormal findings: Secondary | ICD-10-CM | POA: Diagnosis not present

## 2020-11-07 ENCOUNTER — Other Ambulatory Visit: Payer: Self-pay | Admitting: Hematology and Oncology

## 2020-11-07 DIAGNOSIS — C50412 Malignant neoplasm of upper-outer quadrant of left female breast: Secondary | ICD-10-CM

## 2020-11-14 DIAGNOSIS — D649 Anemia, unspecified: Secondary | ICD-10-CM | POA: Diagnosis not present

## 2020-11-14 DIAGNOSIS — E7800 Pure hypercholesterolemia, unspecified: Secondary | ICD-10-CM | POA: Diagnosis not present

## 2020-11-14 DIAGNOSIS — I10 Essential (primary) hypertension: Secondary | ICD-10-CM | POA: Diagnosis not present

## 2020-11-14 DIAGNOSIS — C50912 Malignant neoplasm of unspecified site of left female breast: Secondary | ICD-10-CM | POA: Diagnosis not present

## 2020-11-14 DIAGNOSIS — D500 Iron deficiency anemia secondary to blood loss (chronic): Secondary | ICD-10-CM | POA: Diagnosis not present

## 2020-11-14 DIAGNOSIS — M810 Age-related osteoporosis without current pathological fracture: Secondary | ICD-10-CM | POA: Diagnosis not present

## 2020-11-14 DIAGNOSIS — E782 Mixed hyperlipidemia: Secondary | ICD-10-CM | POA: Diagnosis not present

## 2020-11-14 DIAGNOSIS — K219 Gastro-esophageal reflux disease without esophagitis: Secondary | ICD-10-CM | POA: Diagnosis not present

## 2020-11-14 DIAGNOSIS — M8580 Other specified disorders of bone density and structure, unspecified site: Secondary | ICD-10-CM | POA: Diagnosis not present

## 2020-11-14 DIAGNOSIS — J45909 Unspecified asthma, uncomplicated: Secondary | ICD-10-CM | POA: Diagnosis not present

## 2020-11-19 ENCOUNTER — Emergency Department (HOSPITAL_BASED_OUTPATIENT_CLINIC_OR_DEPARTMENT_OTHER)
Admission: EM | Admit: 2020-11-19 | Discharge: 2020-11-19 | Disposition: A | Discharge status: Home / Self Care | Payer: Medicare Other | Attending: Emergency Medicine | Admitting: Emergency Medicine

## 2020-11-19 ENCOUNTER — Encounter (HOSPITAL_BASED_OUTPATIENT_CLINIC_OR_DEPARTMENT_OTHER): Payer: Self-pay | Admitting: *Deleted

## 2020-11-19 ENCOUNTER — Emergency Department (HOSPITAL_BASED_OUTPATIENT_CLINIC_OR_DEPARTMENT_OTHER): Payer: Medicare Other | Admitting: Radiology

## 2020-11-19 ENCOUNTER — Other Ambulatory Visit: Payer: Self-pay

## 2020-11-19 ENCOUNTER — Emergency Department (HOSPITAL_BASED_OUTPATIENT_CLINIC_OR_DEPARTMENT_OTHER): Payer: Medicare Other

## 2020-11-19 DIAGNOSIS — J45909 Unspecified asthma, uncomplicated: Secondary | ICD-10-CM | POA: Insufficient documentation

## 2020-11-19 DIAGNOSIS — I110 Hypertensive heart disease with heart failure: Secondary | ICD-10-CM | POA: Diagnosis not present

## 2020-11-19 DIAGNOSIS — R110 Nausea: Secondary | ICD-10-CM | POA: Insufficient documentation

## 2020-11-19 DIAGNOSIS — Z79899 Other long term (current) drug therapy: Secondary | ICD-10-CM | POA: Diagnosis not present

## 2020-11-19 DIAGNOSIS — Z96612 Presence of left artificial shoulder joint: Secondary | ICD-10-CM | POA: Insufficient documentation

## 2020-11-19 DIAGNOSIS — Z853 Personal history of malignant neoplasm of breast: Secondary | ICD-10-CM | POA: Insufficient documentation

## 2020-11-19 DIAGNOSIS — I5033 Acute on chronic diastolic (congestive) heart failure: Secondary | ICD-10-CM | POA: Insufficient documentation

## 2020-11-19 DIAGNOSIS — Z20822 Contact with and (suspected) exposure to covid-19: Secondary | ICD-10-CM | POA: Diagnosis not present

## 2020-11-19 DIAGNOSIS — I10 Essential (primary) hypertension: Secondary | ICD-10-CM | POA: Diagnosis not present

## 2020-11-19 DIAGNOSIS — R231 Pallor: Secondary | ICD-10-CM | POA: Diagnosis not present

## 2020-11-19 DIAGNOSIS — Z743 Need for continuous supervision: Secondary | ICD-10-CM | POA: Diagnosis not present

## 2020-11-19 DIAGNOSIS — R000 Tachycardia, unspecified: Secondary | ICD-10-CM | POA: Diagnosis not present

## 2020-11-19 DIAGNOSIS — R531 Weakness: Secondary | ICD-10-CM | POA: Diagnosis not present

## 2020-11-19 DIAGNOSIS — R61 Generalized hyperhidrosis: Secondary | ICD-10-CM | POA: Diagnosis not present

## 2020-11-19 DIAGNOSIS — Z96641 Presence of right artificial hip joint: Secondary | ICD-10-CM | POA: Diagnosis not present

## 2020-11-19 LAB — COMPREHENSIVE METABOLIC PANEL
ALT: 12 U/L (ref 0–44)
AST: 15 U/L (ref 15–41)
Albumin: 3.6 g/dL (ref 3.5–5.0)
Alkaline Phosphatase: 67 U/L (ref 38–126)
Anion gap: 11 (ref 5–15)
BUN: 20 mg/dL (ref 8–23)
CO2: 24 mmol/L (ref 22–32)
Calcium: 9.2 mg/dL (ref 8.9–10.3)
Chloride: 105 mmol/L (ref 98–111)
Creatinine, Ser: 0.78 mg/dL (ref 0.44–1.00)
GFR, Estimated: >60 mL/min (ref >60)
Glucose, Bld: 140 mg/dL — ABNORMAL HIGH (ref 70–99)
Potassium: 3.7 mmol/L (ref 3.5–5.1)
Sodium: 140 mmol/L (ref 135–145)
Total Bilirubin: 0.6 mg/dL (ref 0.3–1.2)
Total Protein: 6.7 g/dL (ref 6.5–8.1)

## 2020-11-19 LAB — CBC WITH DIFFERENTIAL/PLATELET
Abs Immature Granulocytes: 0.09 10*3/uL — ABNORMAL HIGH (ref 0.00–0.07)
Basophils Absolute: 0 10*3/uL (ref 0.0–0.1)
Basophils Relative: 0 %
Eosinophils Absolute: 0 10*3/uL (ref 0.0–0.5)
Eosinophils Relative: 0 %
HCT: 30.7 % — ABNORMAL LOW (ref 36.0–46.0)
Hemoglobin: 9.8 g/dL — ABNORMAL LOW (ref 12.0–15.0)
Immature Granulocytes: 1 %
Lymphocytes Relative: 19 %
Lymphs Abs: 1.8 10*3/uL (ref 0.7–4.0)
MCH: 30.7 pg (ref 26.0–34.0)
MCHC: 31.9 g/dL (ref 30.0–36.0)
MCV: 96.2 fL (ref 80.0–100.0)
Monocytes Absolute: 0.6 10*3/uL (ref 0.1–1.0)
Monocytes Relative: 6 %
Neutro Abs: 6.9 10*3/uL (ref 1.7–7.7)
Neutrophils Relative %: 74 %
Platelets: 237 10*3/uL (ref 150–400)
RBC: 3.19 MIL/uL — ABNORMAL LOW (ref 3.87–5.11)
RDW: 13.7 % (ref 11.5–15.5)
WBC: 9.3 10*3/uL (ref 4.0–10.5)
nRBC: 0 % (ref 0.0–0.2)

## 2020-11-19 LAB — URINALYSIS, ROUTINE W REFLEX MICROSCOPIC
Bilirubin Urine: NEGATIVE
Glucose, UA: NEGATIVE mg/dL
Ketones, ur: NEGATIVE mg/dL
Leukocytes,Ua: NEGATIVE
Nitrite: NEGATIVE
Specific Gravity, Urine: 1.015 (ref 1.005–1.030)
pH: 5.5 (ref 5.0–8.0)

## 2020-11-19 LAB — PHOSPHORUS: Phosphorus: 2.8 mg/dL (ref 2.5–4.6)

## 2020-11-19 LAB — LIPASE, BLOOD: Lipase: <10 U/L — ABNORMAL LOW (ref 11–51)

## 2020-11-19 LAB — PROTIME-INR
INR: 1 (ref 0.8–1.2)
Prothrombin Time: 13.4 seconds (ref 11.4–15.2)

## 2020-11-19 LAB — BRAIN NATRIURETIC PEPTIDE: B Natriuretic Peptide: 230.6 pg/mL — ABNORMAL HIGH (ref 0.0–100.0)

## 2020-11-19 LAB — TROPONIN I (HIGH SENSITIVITY)
Troponin I (High Sensitivity): 22 ng/L — ABNORMAL HIGH (ref <18)
Troponin I (High Sensitivity): 23 ng/L — ABNORMAL HIGH (ref <18)

## 2020-11-19 LAB — MAGNESIUM: Magnesium: 1.9 mg/dL (ref 1.7–2.4)

## 2020-11-19 LAB — RESP PANEL BY RT-PCR (FLU A&B, COVID) ARPGX2
Influenza A by PCR: NEGATIVE
Influenza B by PCR: NEGATIVE
SARS Coronavirus 2 by RT PCR: NEGATIVE

## 2020-11-19 LAB — LACTIC ACID, PLASMA: Lactic Acid, Venous: 1 mmol/L (ref 0.5–1.9)

## 2020-11-19 NOTE — ED Notes (Signed)
Patient transported to X-ray 

## 2020-11-19 NOTE — ED Triage Notes (Signed)
Not feeling good since she woke up this morning and feeling clammy and urinary frequency.

## 2020-11-19 NOTE — ED Provider Notes (Signed)
  Physical Exam  BP (!) 158/78   Pulse (!) 103   Temp 98.8 F (37.1 C) (Oral)   Resp 18   Ht 5\' 4"  (1.626 m)   Wt 59 kg   SpO2 96%   BMI 22.31 kg/m   Physical Exam  ED Course/Procedures     Procedures  MDM  Awaiting second troponin. Second troponin is stable.  No clear etiology for her symptoms.  At this point she is feeling better and feels comfortable going home with close follow-up.  Observation admission was discussed, but she did decline this treatment.       Arnaldo Natal, MD 11/19/20 [phone]

## 2020-11-19 NOTE — ED Provider Notes (Signed)
[facility] EMERGENCY DEPT Provider Note   CSN: [id number] Arrival date & time: 11/19/20  1313     History Chief Complaint  Patient presents with  . Weakness    Brandy [name] is a 87 y.o. female.  HPI Patient reports that she called EMS because she was feeling clammy and very weak.  She reports she awakened from sleep this morning and felt like she was kind of sweaty and clammy.  She reports she did have to get up during the night to go the bathroom a number of times although she does not think that is all that unusual for her.  She denies she was having burning urgency.  She reports that once she was awake she was feeling pretty weak and even thought she might be close to passing out.  This is what precipitated her calling EMS.  Patient denies that she was having any chest pain.  She denies she perceived any palpitations or shortness of breath.  She denies headache.  She denies to proceed focal weakness numbness or tingling.  She denies she is been sick recently.  She denies any recent fevers or chills that she is aware of.  He reports she has been eating normally.  She reports she lives home with her husband.  Reports she does not tend to stay in the hospital and has to get home to take care of things.    Past Medical History:  Diagnosis Date  . Anemia   . Arthritis    shoulders  . Asthma    mild, no inhalers used  . Breast cancer ([place]) 12/18/11   left breast masectomy=metastatic ca in (1/1) lymph node ,invasive ductal ca,2 foci,,dcis,lymph ovascular invasion identified,surgical resection margins neg for ca,additional tissue=benign skin and subcutaneous tissue  . Bronchitis    hx of;last time >73yr ago  . Depression    takes Celexa daily  . Difficult intubation   . Dysphagia   . Full dentures   . Gall stones 2015  . GERD (gastroesophageal reflux disease)    takes Prilosec prn  . H/O hiatal hernia   . History of kidney stones    many yrs ago  . Hx of radiation  therapy 04/26/12 -06/10/12   left breast  . Hyperlipidemia    takes Simvastatin daily  . Hypertension    takes Amlodipine and Diovan daily  . Insomnia    takes Ambien nightly  . Urinary urgency     Patient Active Problem List   Diagnosis Date Noted  . Hip fracture ([place]) 11/11/2018  . Acute respiratory failure with hypoxia ([place]) 10/29/2018  . S/P right hip fracture 10/24/2018  . Acute on chronic diastolic CHF (congestive heart failure) ([place]) 01/19/2017  . HCAP (healthcare-associated pneumonia) 12/11/2016  . S/P exploratory laparotomy 11/17/2016  . Small bowel obstruction ([place]) 11/17/2016  . Pre-operative cardiovascular examination   . Incarcerated incisional hernia s/p SB resection & repair 11/17/2016 11/15/2016  . Acute renal failure (ARF) ([place]) 11/15/2016  . Hyperglycemia 11/15/2016  . Asthma without status asthmaticus 09/24/2016  . Carpal tunnel syndrome 09/24/2016  . Asthma 09/24/2016  . Dark stools 09/24/2016  . Degenerative arthritis of lumbar spine 09/24/2016  . DJD (degenerative joint disease), cervical 09/24/2016  . Dyspnea on exertion 09/24/2016  . Generalized anxiety disorder 09/24/2016  . Lumbosacral spondylosis without myelopathy 09/24/2016  . Morbid (severe) obesity due to excess calories ([place]) 09/24/2016  . Pernicious anemia 09/24/2016  . Pure hypercholesterolemia 09/24/2016  . Right knee pain 09/24/2016  .  Sleep disorder 09/24/2016  . Vitamin B 12 deficiency 09/24/2016  . Vitamin D deficiency 09/24/2016  . Gastric ulcer   . Iron deficiency anemia due to chronic blood loss   . Abdominal pain, chronic, epigastric   . Acute gastric ulcer without hemorrhage or perforation   . Duodenal ulcer   . Closed fracture of nasal septum 08/27/2015  . Closed fracture of zygomatic arch ([place]) 08/27/2015  . Protein-calorie malnutrition, severe ([place]) 12/18/2013  . Choledocholithiasis 12/17/2013  . Sepsis ([place]) 12/17/2013  . Osteopenia 11/17/2012  . Moderate aortic stenosis  10/17/2012  . Hx of radiation therapy   . Depression   . History of kidney stones   . Urinary urgency   . Gastroesophageal reflux disease without esophagitis   . Hyperlipidemia   . Essential hypertension   . Bronchitis   . Arthritis   . Dysphagia   . H/O hiatal hernia   . Primary cancer of upper outer quadrant of left female breast ([place]) 12/01/2011    Past Surgical History:  Procedure Laterality Date  . APPENDECTOMY    . bladder tack    . BREAST BIOPSY  1998   left   . DILATION AND CURETTAGE OF UTERUS    . ENDOSCOPIC RETROGRADE CHOLANGIOPANCREATOGRAPHY (ERCP) WITH PROPOFOL N/A 02/01/2014   Procedure: ENDOSCOPIC RETROGRADE CHOLANGIOPANCREATOGRAPHY (ERCP) WITH PROPOFOL;  Surgeon: [name], MD;  Location: [facility];  Service: Endoscopy;  Laterality: N/A;  . ESOPHAGOGASTRODUODENOSCOPY    . ESOPHAGOGASTRODUODENOSCOPY N/A 06/26/2016   Procedure: ESOPHAGOGASTRODUODENOSCOPY (EGD);  Surgeon: [name], MD;  Location: [facility];  Service: Endoscopy;  Laterality: N/A;  . ESOPHAGOGASTRODUODENOSCOPY (EGD) WITH PROPOFOL  12/19/2013   Procedure: ESOPHAGOGASTRODUODENOSCOPY (EGD) WITH PROPOFOL;  Surgeon: Beryle Beams, MD;  Location: [facility];  Service: Endoscopy;;  . ESOPHAGOGASTRODUODENOSCOPY (EGD) WITH PROPOFOL N/A 09/10/2016   Procedure: ESOPHAGOGASTRODUODENOSCOPY (EGD) WITH PROPOFOL;  Surgeon: [name], MD;  [facility]: [facility];  Service: Endoscopy;  Laterality: N/A;  . EUS  06/04/2011   Procedure: UPPER ENDOSCOPIC ULTRASOUND (EUS) LINEAR;  Surgeon: Owens Loffler, MD;  [facility]: [facility];  Service: Endoscopy;  Laterality: N/A;  . EUS N/A 02/01/2014   Procedure: UPPER ENDOSCOPIC ULTRASOUND (EUS) LINEAR;  Surgeon: [name], MD;  [facility]: [facility];  Service: Endoscopy;  Laterality: N/A;  . EXPLORATORY LAPAROTOMY      biopsy of intra-abdominal mass  . INTRAMEDULLARY (IM) NAIL INTERTROCHANTERIC Right 10/24/2018   Procedure: INTRAMEDULLARY (IM) NAIL  INTERTROCHANTRIC;  Surgeon: Gaynelle Arabian, MD;  Location: [facility];  Service: Orthopedics;  Laterality: Right;  . LAPAROTOMY N/A 11/17/2016   Procedure: EXPLORATORY LAPAROTOMY WITH LYSIS OF ADHESIONS, SMALL BOWEL RESECTION, REPAIR OF INCARCERATED VENTRAL INCISIONAL HERNIA, INSERTION OF BIOLOGIC MESH PATCH,APPLICATION OF WOUND VAC DRESSING;  Surgeon: Armandina Gemma, MD;  Location: [facility];  Service: General;  Laterality: N/A;  . MASTECTOMY W/ SENTINEL NODE BIOPSY  12/18/2011   Procedure: MASTECTOMY WITH SENTINEL LYMPH NODE BIOPSY;  Surgeon: Rolm Bookbinder, MD;  Location: [facility];  Service: General;  Laterality: Left;  . PORT-A-CATH REMOVAL Right 06/15/2013   Procedure: REMOVAL PORT-A-CATH;  Surgeon: Rolm Bookbinder, MD;  Location: [facility];  Service: General;  Laterality: Right;  . PORTACATH PLACEMENT  01/27/2012   Procedure: INSERTION PORT-A-CATH;  Surgeon: Rolm Bookbinder, MD;  Location: [facility];  Service: General;  Laterality: Right;  PORT PLACEMENT  . TOTAL HIP ARTHROPLASTY Right 11/14/2018   Procedure: REMOVAL OF INTRAMEDULLARY NAIL AND POSTERIOR TOTAL HIP ARTHROPLASTY;  Surgeon: Gaynelle Arabian, MD;  Location: [facility];  Service: Orthopedics;  Laterality: Right;  . TOTAL SHOULDER REPLACEMENT  2011   left  . TUBAL LIGATION       OB History   No obstetric history on file.     Family History  Problem Relation Age of Onset  . Prostate cancer Father   . Breast cancer Other     Social History   Tobacco Use  . Smoking status: Never Smoker  . Smokeless tobacco: Never Used  Substance Use Topics  . Alcohol use: No  . Drug use: No    Home Medications Prior to Admission medications   Medication Sig Start Date End Date Taking? Authorizing Provider  atorvastatin (LIPITOR) 40 MG tablet Take 40 mg by mouth daily.  04/27/16  Yes [provider]  citalopram (CELEXA) 20 MG tablet Take 1 tablet (20 mg total) by mouth daily. 11/01/18  Yes Regalado, Belkys  A, MD  Cyanocobalamin (VITAMIN B-12 PO) Take 1 tablet by mouth daily.   Yes [provider]  ferrous sulfate 325 (65 FE) MG tablet Take 1 tablet (325 mg total) by mouth 2 (two) times daily with a meal. Patient taking differently: Take 325 mg by mouth daily. 11/01/18  Yes Regalado, Belkys A, MD  gabapentin (NEURONTIN) 300 MG capsule TAKE 1 CAPSULE BY MOUTH EVERYDAY AT BEDTIME 11/08/20  Yes Nicholas Lose, MD  Multiple Vitamin (MULTIVITAMIN WITH MINERALS) TABS tablet Take 1 tablet by mouth daily.   Yes [provider]  omeprazole (PRILOSEC) 40 MG capsule TAKE 1 CAPSULE (40 MG TOTAL) BY MOUTH 2 (TWO) TIMES DAILY BEFORE A MEAL. 05/20/18  Yes [name], MD  traMADol (ULTRAM) 50 MG tablet Take 2 tablets (100 mg total) by mouth 4 (four) times daily. 11/16/18  Yes Edmisten, Kristie L, PA  valsartan-hydrochlorothiazide (DIOVAN-HCT) 80-12.5 MG tablet Take 1 tablet by mouth daily.   Yes [provider]  acetaminophen (TYLENOL) 500 MG tablet Take 1,000 mg by mouth every 6 (six) hours as needed (pain).    [provider]  Vitamin D, Ergocalciferol, (DRISDOL) 50000 units CAPS capsule Take 50,000 Units by mouth every Thursday.     [provider]    Allergies    Patient has no known allergies.  Review of Systems   Review of Systems 10 systems reviewed and negative except as per HPI Physical Exam Updated Vital Signs BP (!) 158/78   Pulse (!) 103   Temp 98.8 F (37.1 C) (Oral)   Resp 18   Ht 5\' 4"  (1.626 m)   Wt 59 kg   SpO2 96%   BMI 22.31 kg/m   Physical Exam Constitutional:      Comments: Patient is alert.  Mental status is clear.  No respiratory distress.  She is answering questions appropriately.  Following commands  HENT:     Head: Normocephalic and atraumatic.     Mouth/Throat:     Mouth: Mucous membranes are moist.     Pharynx: Oropharynx is clear.     Comments: Edentulous. Eyes:     Extraocular Movements: Extraocular movements intact.      Pupils: Pupils are equal, round, and reactive to light.  Cardiovascular:     Rate and Rhythm: Normal rate and regular rhythm.     Comments: 3 out of 6 systolic ejection murmur harsh. Pulmonary:     Comments: No respiratory distress.  Some basilar crackle Abdominal:     General: There is no distension.     Palpations: Abdomen is  soft.     Tenderness: There is no abdominal tenderness. There is no guarding.  Musculoskeletal:        General: No swelling or tenderness. Normal range of motion.     Right lower leg: No edema.     Left lower leg: No edema.     Comments: No peripheral edema.  Calves are soft and pliable  Skin:    General: Skin is warm and dry.  Neurological:     General: No focal deficit present.     Mental Status: She is oriented to person, place, and time.     Cranial Nerves: No cranial nerve deficit.     Motor: No weakness.     Coordination: Coordination normal.     Comments: Patient is alert and interactive.  She is answering questions appropriately.  No receptive or expressive aphasia.  Strength 5\5 bilaterally.  Patient can hold and elevate both lower extremities off of the bed.  Psychiatric:        Mood and Affect: Mood normal.     ED Results / Procedures / Treatments   Labs (all labs ordered are listed, but only abnormal results are displayed) Labs Reviewed  COMPREHENSIVE METABOLIC PANEL - Abnormal; Notable for the following components:      Result Value   Glucose, Bld 140 (*)    All other components within normal limits  LIPASE, BLOOD - Abnormal; Notable for the following components:   Lipase <10 (*)    All other components within normal limits  BRAIN NATRIURETIC PEPTIDE - Abnormal; Notable for the following components:   B Natriuretic Peptide 230.6 (*)    All other components within normal limits  CBC WITH DIFFERENTIAL/PLATELET - Abnormal; Notable for the following components:   RBC 3.19 (*)    Hemoglobin 9.8 (*)    HCT 30.7 (*)    Abs Immature  Granulocytes 0.09 (*)    All other components within normal limits  URINALYSIS, ROUTINE W REFLEX MICROSCOPIC - Abnormal; Notable for the following components:   Hgb urine dipstick SMALL (*)    Protein, ur TRACE (*)    Bacteria, UA RARE (*)    All other components within normal limits  TROPONIN I (HIGH SENSITIVITY) - Abnormal; Notable for the following components:   Troponin I (High Sensitivity) 22 (*)    All other components within normal limits  RESP PANEL BY RT-PCR (FLU A&B, COVID) ARPGX2  LACTIC ACID, PLASMA  PROTIME-INR  MAGNESIUM  PHOSPHORUS  LACTIC ACID, PLASMA  TROPONIN I (HIGH SENSITIVITY)    EKG None  Radiology DG Chest 2 View  Result Date: 11/19/2020 CLINICAL DATA:  Generalized weakness EXAM: CHEST - 2 VIEW COMPARISON:  Nov 11, 2018 FINDINGS: Lungs are clear. Heart size and pulmonary vascularity are normal. No adenopathy. There is a total shoulder replacement left. There is extensive arthropathy in the right shoulder. IMPRESSION: No edema or airspace opacity.  Heart size within normal limits. Electronically Signed   By: Lowella Grip III M.D.   On: 11/19/2020 14:49   CT Head Wo Contrast  Result Date: 11/19/2020 CLINICAL DATA:  Altered mental status EXAM: CT HEAD WITHOUT CONTRAST TECHNIQUE: Contiguous axial images were obtained from the base of the skull through the vertex without intravenous contrast. COMPARISON:  Nov 10, 2018 FINDINGS: Brain: Mild diffuse atrophy is stable. There is no intracranial mass, hemorrhage, extra-axial fluid collection, or midline shift. There is decreased attenuation throughout much of the centra semiovale bilaterally, stable. Small vessel disease is noted in  the anterior limbs of each external capsule, stable. No acute infarct evident on this study. Vascular: No hyperdense vessels. Calcification noted in the distal right vertebral artery and in each carotid siphon region. Skull: Bony calvarium appears intact. Sinuses/Orbits: Mucosal thickening  noted in the posterolateral ethmoid air cell region. There is opacification of several mid ethmoid air cells on the right. Visualized orbits appear symmetric bilaterally. Other: Mastoid air cells clear. IMPRESSION: Stable atrophy with extensive periventricular small vessel disease, stable. Small vessel disease in the anterior limb of each external capsule noted. No evident acute infarct. No mass or hemorrhage. There are foci of arterial vascular calcification at several sites. There are foci of ethmoid air cell disease on the right. Electronically Signed   By: Lowella Grip III M.D.   On: 11/19/2020 14:52    Procedures Procedures   Medications Ordered in ED Medications - No data to display  ED Course  I have reviewed the triage vital signs and the nursing notes.  Pertinent labs & imaging results that were available during my care of the patient were reviewed by me and considered in my medical decision making (see chart for details).    MDM Rules/Calculators/A&P                          Patient describes an episode of being nauseated, weak and diaphoretic.  At this time, labs do not show significant derangement.  Troponin is mildly elevated at 22.  Patient's vital signs are stable.  She has been intent on going home.  Patient will need a repeat troponin.  If elevated, patient will need to be admitted.  If no elevation, can consider discharge with careful return precautions.  At this time, no evident source of infection to explain patient's symptoms. Final Clinical Impression(s) / ED Diagnoses Final diagnoses:  None    Rx / DC Orders ED Discharge Orders    None       Charlesetta Shanks, MD 11/19/20 1550

## 2020-12-18 DIAGNOSIS — E782 Mixed hyperlipidemia: Secondary | ICD-10-CM | POA: Diagnosis not present

## 2020-12-18 DIAGNOSIS — M8580 Other specified disorders of bone density and structure, unspecified site: Secondary | ICD-10-CM | POA: Diagnosis not present

## 2020-12-18 DIAGNOSIS — E7800 Pure hypercholesterolemia, unspecified: Secondary | ICD-10-CM | POA: Diagnosis not present

## 2020-12-18 DIAGNOSIS — K219 Gastro-esophageal reflux disease without esophagitis: Secondary | ICD-10-CM | POA: Diagnosis not present

## 2020-12-18 DIAGNOSIS — I10 Essential (primary) hypertension: Secondary | ICD-10-CM | POA: Diagnosis not present

## 2020-12-18 DIAGNOSIS — N1831 Chronic kidney disease, stage 3a: Secondary | ICD-10-CM | POA: Diagnosis not present

## 2020-12-18 DIAGNOSIS — D500 Iron deficiency anemia secondary to blood loss (chronic): Secondary | ICD-10-CM | POA: Diagnosis not present

## 2020-12-18 DIAGNOSIS — I129 Hypertensive chronic kidney disease with stage 1 through stage 4 chronic kidney disease, or unspecified chronic kidney disease: Secondary | ICD-10-CM | POA: Diagnosis not present

## 2020-12-18 DIAGNOSIS — D649 Anemia, unspecified: Secondary | ICD-10-CM | POA: Diagnosis not present

## 2020-12-18 DIAGNOSIS — J45909 Unspecified asthma, uncomplicated: Secondary | ICD-10-CM | POA: Diagnosis not present

## 2020-12-18 DIAGNOSIS — M810 Age-related osteoporosis without current pathological fracture: Secondary | ICD-10-CM | POA: Diagnosis not present

## 2020-12-18 DIAGNOSIS — C50912 Malignant neoplasm of unspecified site of left female breast: Secondary | ICD-10-CM | POA: Diagnosis not present

## 2020-12-23 DIAGNOSIS — D649 Anemia, unspecified: Secondary | ICD-10-CM | POA: Diagnosis not present

## 2020-12-23 DIAGNOSIS — I10 Essential (primary) hypertension: Secondary | ICD-10-CM | POA: Diagnosis not present

## 2020-12-23 DIAGNOSIS — Z79899 Other long term (current) drug therapy: Secondary | ICD-10-CM | POA: Diagnosis not present

## 2020-12-23 DIAGNOSIS — M810 Age-related osteoporosis without current pathological fracture: Secondary | ICD-10-CM | POA: Diagnosis not present

## 2020-12-23 DIAGNOSIS — E782 Mixed hyperlipidemia: Secondary | ICD-10-CM | POA: Diagnosis not present

## 2020-12-23 DIAGNOSIS — M5030 Other cervical disc degeneration, unspecified cervical region: Secondary | ICD-10-CM | POA: Diagnosis not present

## 2020-12-24 DIAGNOSIS — D649 Anemia, unspecified: Secondary | ICD-10-CM | POA: Diagnosis not present

## 2021-02-16 DIAGNOSIS — D649 Anemia, unspecified: Secondary | ICD-10-CM | POA: Diagnosis not present

## 2021-02-16 DIAGNOSIS — M810 Age-related osteoporosis without current pathological fracture: Secondary | ICD-10-CM | POA: Diagnosis not present

## 2021-02-16 DIAGNOSIS — E782 Mixed hyperlipidemia: Secondary | ICD-10-CM | POA: Diagnosis not present

## 2021-02-16 DIAGNOSIS — C50912 Malignant neoplasm of unspecified site of left female breast: Secondary | ICD-10-CM | POA: Diagnosis not present

## 2021-02-16 DIAGNOSIS — J45909 Unspecified asthma, uncomplicated: Secondary | ICD-10-CM | POA: Diagnosis not present

## 2021-02-16 DIAGNOSIS — E7800 Pure hypercholesterolemia, unspecified: Secondary | ICD-10-CM | POA: Diagnosis not present

## 2021-02-16 DIAGNOSIS — I129 Hypertensive chronic kidney disease with stage 1 through stage 4 chronic kidney disease, or unspecified chronic kidney disease: Secondary | ICD-10-CM | POA: Diagnosis not present

## 2021-02-16 DIAGNOSIS — M8580 Other specified disorders of bone density and structure, unspecified site: Secondary | ICD-10-CM | POA: Diagnosis not present

## 2021-02-16 DIAGNOSIS — D500 Iron deficiency anemia secondary to blood loss (chronic): Secondary | ICD-10-CM | POA: Diagnosis not present

## 2021-02-16 DIAGNOSIS — N1831 Chronic kidney disease, stage 3a: Secondary | ICD-10-CM | POA: Diagnosis not present

## 2021-02-16 DIAGNOSIS — K219 Gastro-esophageal reflux disease without esophagitis: Secondary | ICD-10-CM | POA: Diagnosis not present

## 2021-02-16 DIAGNOSIS — I10 Essential (primary) hypertension: Secondary | ICD-10-CM | POA: Diagnosis not present

## 2021-02-26 DIAGNOSIS — Z79891 Long term (current) use of opiate analgesic: Secondary | ICD-10-CM | POA: Diagnosis not present

## 2021-02-26 DIAGNOSIS — M47812 Spondylosis without myelopathy or radiculopathy, cervical region: Secondary | ICD-10-CM | POA: Diagnosis not present

## 2021-02-26 DIAGNOSIS — G894 Chronic pain syndrome: Secondary | ICD-10-CM | POA: Diagnosis not present

## 2021-04-25 DIAGNOSIS — Z7189 Other specified counseling: Secondary | ICD-10-CM | POA: Diagnosis not present

## 2021-04-25 DIAGNOSIS — M25512 Pain in left shoulder: Secondary | ICD-10-CM | POA: Diagnosis not present

## 2021-04-25 DIAGNOSIS — S4992XA Unspecified injury of left shoulder and upper arm, initial encounter: Secondary | ICD-10-CM | POA: Diagnosis not present

## 2021-05-01 DIAGNOSIS — S4992XD Unspecified injury of left shoulder and upper arm, subsequent encounter: Secondary | ICD-10-CM | POA: Diagnosis not present

## 2021-05-01 DIAGNOSIS — M79602 Pain in left arm: Secondary | ICD-10-CM | POA: Diagnosis not present

## 2021-05-01 DIAGNOSIS — M25512 Pain in left shoulder: Secondary | ICD-10-CM | POA: Diagnosis not present

## 2021-05-05 ENCOUNTER — Inpatient Hospital Stay (HOSPITAL_BASED_OUTPATIENT_CLINIC_OR_DEPARTMENT_OTHER)
Admission: EM | Admit: 2021-05-05 | Discharge: 2021-05-09 | DRG: 193 | Disposition: A | Discharge status: Home Health | Payer: Medicare Other | Attending: Internal Medicine | Admitting: Internal Medicine

## 2021-05-05 ENCOUNTER — Emergency Department (HOSPITAL_BASED_OUTPATIENT_CLINIC_OR_DEPARTMENT_OTHER): Payer: Medicare Other

## 2021-05-05 ENCOUNTER — Other Ambulatory Visit: Payer: Self-pay

## 2021-05-05 ENCOUNTER — Encounter (HOSPITAL_BASED_OUTPATIENT_CLINIC_OR_DEPARTMENT_OTHER): Payer: Self-pay

## 2021-05-05 ENCOUNTER — Encounter: Payer: Self-pay | Admitting: Hematology and Oncology

## 2021-05-05 DIAGNOSIS — M25512 Pain in left shoulder: Secondary | ICD-10-CM | POA: Diagnosis present

## 2021-05-05 DIAGNOSIS — Y92009 Unspecified place in unspecified non-institutional (private) residence as the place of occurrence of the external cause: Secondary | ICD-10-CM | POA: Diagnosis not present

## 2021-05-05 DIAGNOSIS — Z66 Do not resuscitate: Secondary | ICD-10-CM | POA: Diagnosis present

## 2021-05-05 DIAGNOSIS — G4700 Insomnia, unspecified: Secondary | ICD-10-CM | POA: Diagnosis present

## 2021-05-05 DIAGNOSIS — W19XXXA Unspecified fall, initial encounter: Secondary | ICD-10-CM

## 2021-05-05 DIAGNOSIS — Z20822 Contact with and (suspected) exposure to covid-19: Secondary | ICD-10-CM | POA: Diagnosis not present

## 2021-05-05 DIAGNOSIS — Z923 Personal history of irradiation: Secondary | ICD-10-CM | POA: Diagnosis not present

## 2021-05-05 DIAGNOSIS — K219 Gastro-esophageal reflux disease without esophagitis: Secondary | ICD-10-CM | POA: Diagnosis not present

## 2021-05-05 DIAGNOSIS — F0390 Unspecified dementia without behavioral disturbance: Secondary | ICD-10-CM | POA: Diagnosis present

## 2021-05-05 DIAGNOSIS — I350 Nonrheumatic aortic (valve) stenosis: Secondary | ICD-10-CM | POA: Diagnosis not present

## 2021-05-05 DIAGNOSIS — I517 Cardiomegaly: Secondary | ICD-10-CM | POA: Diagnosis not present

## 2021-05-05 DIAGNOSIS — I6529 Occlusion and stenosis of unspecified carotid artery: Secondary | ICD-10-CM | POA: Diagnosis not present

## 2021-05-05 DIAGNOSIS — R5381 Other malaise: Secondary | ICD-10-CM

## 2021-05-05 DIAGNOSIS — Z9181 History of falling: Secondary | ICD-10-CM | POA: Diagnosis not present

## 2021-05-05 DIAGNOSIS — I639 Cerebral infarction, unspecified: Secondary | ICD-10-CM | POA: Diagnosis not present

## 2021-05-05 DIAGNOSIS — R2681 Unsteadiness on feet: Secondary | ICD-10-CM | POA: Diagnosis not present

## 2021-05-05 DIAGNOSIS — Z9012 Acquired absence of left breast and nipple: Secondary | ICD-10-CM

## 2021-05-05 DIAGNOSIS — F32A Depression, unspecified: Secondary | ICD-10-CM | POA: Diagnosis not present

## 2021-05-05 DIAGNOSIS — R0600 Dyspnea, unspecified: Secondary | ICD-10-CM | POA: Diagnosis not present

## 2021-05-05 DIAGNOSIS — W1811XA Fall from or off toilet without subsequent striking against object, initial encounter: Secondary | ICD-10-CM | POA: Diagnosis present

## 2021-05-05 DIAGNOSIS — R55 Syncope and collapse: Secondary | ICD-10-CM | POA: Diagnosis not present

## 2021-05-05 DIAGNOSIS — Z96641 Presence of right artificial hip joint: Secondary | ICD-10-CM | POA: Diagnosis present

## 2021-05-05 DIAGNOSIS — Z471 Aftercare following joint replacement surgery: Secondary | ICD-10-CM | POA: Diagnosis not present

## 2021-05-05 DIAGNOSIS — Z79899 Other long term (current) drug therapy: Secondary | ICD-10-CM | POA: Diagnosis not present

## 2021-05-05 DIAGNOSIS — Z8042 Family history of malignant neoplasm of prostate: Secondary | ICD-10-CM

## 2021-05-05 DIAGNOSIS — I10 Essential (primary) hypertension: Secondary | ICD-10-CM | POA: Diagnosis not present

## 2021-05-05 DIAGNOSIS — Z87442 Personal history of urinary calculi: Secondary | ICD-10-CM

## 2021-05-05 DIAGNOSIS — S0990XA Unspecified injury of head, initial encounter: Secondary | ICD-10-CM | POA: Diagnosis not present

## 2021-05-05 DIAGNOSIS — R059 Cough, unspecified: Secondary | ICD-10-CM | POA: Diagnosis not present

## 2021-05-05 DIAGNOSIS — Z803 Family history of malignant neoplasm of breast: Secondary | ICD-10-CM

## 2021-05-05 DIAGNOSIS — J9621 Acute and chronic respiratory failure with hypoxia: Secondary | ICD-10-CM | POA: Diagnosis not present

## 2021-05-05 DIAGNOSIS — E785 Hyperlipidemia, unspecified: Secondary | ICD-10-CM | POA: Diagnosis not present

## 2021-05-05 DIAGNOSIS — N179 Acute kidney failure, unspecified: Secondary | ICD-10-CM | POA: Diagnosis present

## 2021-05-05 DIAGNOSIS — M19012 Primary osteoarthritis, left shoulder: Secondary | ICD-10-CM | POA: Diagnosis not present

## 2021-05-05 DIAGNOSIS — R296 Repeated falls: Secondary | ICD-10-CM | POA: Diagnosis present

## 2021-05-05 DIAGNOSIS — S43006A Unspecified dislocation of unspecified shoulder joint, initial encounter: Secondary | ICD-10-CM

## 2021-05-05 DIAGNOSIS — Z96612 Presence of left artificial shoulder joint: Secondary | ICD-10-CM | POA: Diagnosis not present

## 2021-05-05 DIAGNOSIS — N281 Cyst of kidney, acquired: Secondary | ICD-10-CM | POA: Diagnosis not present

## 2021-05-05 DIAGNOSIS — J440 Chronic obstructive pulmonary disease with acute lower respiratory infection: Secondary | ICD-10-CM | POA: Diagnosis present

## 2021-05-05 DIAGNOSIS — J189 Pneumonia, unspecified organism: Principal | ICD-10-CM | POA: Diagnosis present

## 2021-05-05 DIAGNOSIS — S0101XA Laceration without foreign body of scalp, initial encounter: Secondary | ICD-10-CM | POA: Diagnosis present

## 2021-05-05 DIAGNOSIS — J449 Chronic obstructive pulmonary disease, unspecified: Secondary | ICD-10-CM | POA: Diagnosis not present

## 2021-05-05 DIAGNOSIS — R000 Tachycardia, unspecified: Secondary | ICD-10-CM | POA: Diagnosis not present

## 2021-05-05 DIAGNOSIS — S199XXA Unspecified injury of neck, initial encounter: Secondary | ICD-10-CM | POA: Diagnosis not present

## 2021-05-05 LAB — COMPREHENSIVE METABOLIC PANEL
ALT: 12 U/L (ref 0–44)
AST: 17 U/L (ref 15–41)
Albumin: 3.1 g/dL — ABNORMAL LOW (ref 3.5–5.0)
Alkaline Phosphatase: 86 U/L (ref 38–126)
Anion gap: 10 (ref 5–15)
BUN: 26 mg/dL — ABNORMAL HIGH (ref 8–23)
CO2: 22 mmol/L (ref 22–32)
Calcium: 8.9 mg/dL (ref 8.9–10.3)
Chloride: 105 mmol/L (ref 98–111)
Creatinine, Ser: 1.29 mg/dL — ABNORMAL HIGH (ref 0.44–1.00)
GFR, Estimated: 40 mL/min — ABNORMAL LOW (ref >60)
Glucose, Bld: 153 mg/dL — ABNORMAL HIGH (ref 70–99)
Potassium: 4.3 mmol/L (ref 3.5–5.1)
Sodium: 137 mmol/L (ref 135–145)
Total Bilirubin: 0.6 mg/dL (ref 0.3–1.2)
Total Protein: 6.6 g/dL (ref 6.5–8.1)

## 2021-05-05 LAB — CBC WITH DIFFERENTIAL/PLATELET
Abs Immature Granulocytes: 0.1 10*3/uL — ABNORMAL HIGH (ref 0.00–0.07)
Basophils Absolute: 0 10*3/uL (ref 0.0–0.1)
Basophils Relative: 0 %
Eosinophils Absolute: 0 10*3/uL (ref 0.0–0.5)
Eosinophils Relative: 0 %
HCT: 29.7 % — ABNORMAL LOW (ref 36.0–46.0)
Hemoglobin: 9.6 g/dL — ABNORMAL LOW (ref 12.0–15.0)
Immature Granulocytes: 1 %
Lymphocytes Relative: 11 %
Lymphs Abs: 1.7 10*3/uL (ref 0.7–4.0)
MCH: 30.4 pg (ref 26.0–34.0)
MCHC: 32.3 g/dL (ref 30.0–36.0)
MCV: 94 fL (ref 80.0–100.0)
Monocytes Absolute: 0.7 10*3/uL (ref 0.1–1.0)
Monocytes Relative: 4 %
Neutro Abs: 13.5 10*3/uL — ABNORMAL HIGH (ref 1.7–7.7)
Neutrophils Relative %: 84 %
Platelets: 262 10*3/uL (ref 150–400)
RBC: 3.16 MIL/uL — ABNORMAL LOW (ref 3.87–5.11)
RDW: 13.9 % (ref 11.5–15.5)
WBC: 16 10*3/uL — ABNORMAL HIGH (ref 4.0–10.5)
nRBC: 0 % (ref 0.0–0.2)

## 2021-05-05 LAB — RESP PANEL BY RT-PCR (FLU A&B, COVID) ARPGX2
Influenza A by PCR: NEGATIVE
Influenza B by PCR: NEGATIVE
SARS Coronavirus 2 by RT PCR: NEGATIVE

## 2021-05-05 LAB — TROPONIN I (HIGH SENSITIVITY)
Troponin I (High Sensitivity): 54 ng/L — ABNORMAL HIGH (ref <18)
Troponin I (High Sensitivity): 63 ng/L — ABNORMAL HIGH (ref <18)

## 2021-05-05 LAB — LACTIC ACID, PLASMA: Lactic Acid, Venous: 1.8 mmol/L (ref 0.5–1.9)

## 2021-05-05 MED ORDER — LACTATED RINGERS IV BOLUS
500.0000 mL | Freq: Once | INTRAVENOUS | Status: AC
  Administered 2021-05-05: 500 mL via INTRAVENOUS

## 2021-05-05 MED ORDER — SODIUM CHLORIDE 0.9 % IV SOLN
2.0000 g | Freq: Once | INTRAVENOUS | Status: AC
  Administered 2021-05-05: 2 g via INTRAVENOUS
  Filled 2021-05-05: qty 20

## 2021-05-05 MED ORDER — SODIUM CHLORIDE 0.9 % IV SOLN
500.0000 mg | Freq: Once | INTRAVENOUS | Status: AC
  Administered 2021-05-05: 500 mg via INTRAVENOUS
  Filled 2021-05-05: qty 500

## 2021-05-05 NOTE — ED Triage Notes (Addendum)
Pt arrives with daughter who reports patient was sitting on commode today and fell forward hitting head on tile, denies LOC, takes 81 mg Asprin daily. Daughter reports patient haas been weaker than normal and confused. Uses a walker normally. Pt has cough in triage. Placed on 2L [facility] after having room air of 89% in triage.

## 2021-05-05 NOTE — ED Provider Notes (Addendum)
[facility] EMERGENCY DEPARTMENT Provider Note   CSN: [id number] Arrival date & time: 05/05/21  1440     History Chief Complaint  Patient presents with   Fall   Shortness of Breath    Brandy [name] is a 87 y.o. female with PMH asthma, breast cancer status postsurgical resection, HLD, HTN who presents to the emergency department for evaluation of a fall and shortness of breath.  Patient states that she was using the commode earlier today, stood up and fell face forward striking her head on the ground.  She states that she has fallen multiple times over the last few weeks and she feels short of breath walking to the bathroom.  She states that the shortness of breath is with all exertion and this is new for her.  History also obtained from daughter who states that she is becoming progressively more confused.  The patient arrives saturating 89% on room air and she does not have a home oxygen requirement.   Fall Associated symptoms include shortness of breath. Pertinent negatives include no chest pain and no abdominal pain.  Shortness of Breath Associated symptoms: no abdominal pain, no chest pain, no cough, no ear pain, no fever, no rash, no sore throat and no vomiting       Past Medical History:  Diagnosis Date   Anemia    Arthritis    shoulders   Asthma    mild, no inhalers used   Breast cancer ([place]) 12/18/11   left breast masectomy=metastatic ca in (1/1) lymph node ,invasive ductal ca,2 foci,,dcis,lymph ovascular invasion identified,surgical resection margins neg for ca,additional tissue=benign skin and subcutaneous tissue   Bronchitis    hx of;last time >39yr ago   Depression    takes Celexa daily   Difficult intubation    Dysphagia    Full dentures    Gall stones 2015   GERD (gastroesophageal reflux disease)    takes Prilosec prn   H/O hiatal hernia    History of kidney stones    many yrs ago   Hx of radiation therapy 04/26/12 -06/10/12   left breast    Hyperlipidemia    takes Simvastatin daily   Hypertension    takes Amlodipine and Diovan daily   Insomnia    takes Ambien nightly   Urinary urgency     Patient Active Problem List   Diagnosis Date Noted   Acute on chronic respiratory failure with hypoxemia ([place]) 05/05/2021   Hip fracture ([place]) 11/11/2018   Acute respiratory failure with hypoxia ([place]) 10/29/2018   S/P right hip fracture 10/24/2018   Acute on chronic diastolic CHF (congestive heart failure) ([place]) 01/19/2017   HCAP (healthcare-associated pneumonia) 12/11/2016   S/P exploratory laparotomy 11/17/2016   Small bowel obstruction ([place]) 11/17/2016   Pre-operative cardiovascular examination    Incarcerated incisional hernia s/p SB resection & repair 11/17/2016 11/15/2016   Acute renal failure (ARF) ([place]) 11/15/2016   Hyperglycemia 11/15/2016   Asthma without status asthmaticus 09/24/2016   Carpal tunnel syndrome 09/24/2016   Asthma 09/24/2016   Dark stools 09/24/2016   Degenerative arthritis of lumbar spine 09/24/2016   DJD (degenerative joint disease), cervical 09/24/2016   Dyspnea on exertion 09/24/2016   Generalized anxiety disorder 09/24/2016   Lumbosacral spondylosis without myelopathy 09/24/2016   Morbid (severe) obesity due to excess calories ([place]) 09/24/2016   Pernicious anemia 09/24/2016   Pure hypercholesterolemia 09/24/2016   Right knee pain 09/24/2016   Sleep disorder 09/24/2016   Vitamin B 12 deficiency  09/24/2016   Vitamin D deficiency 09/24/2016   Gastric ulcer    Iron deficiency anemia due to chronic blood loss    Abdominal pain, chronic, epigastric    Acute gastric ulcer without hemorrhage or perforation    Duodenal ulcer    Closed fracture of nasal septum 08/27/2015   Closed fracture of zygomatic arch ([place]) 08/27/2015   Protein-calorie malnutrition, severe ([place]) 12/18/2013   Choledocholithiasis 12/17/2013   Sepsis ([place]) 12/17/2013   Osteopenia 11/17/2012   Moderate aortic stenosis 10/17/2012    Hx of radiation therapy    Depression    History of kidney stones    Urinary urgency    Gastroesophageal reflux disease without esophagitis    Hyperlipidemia    Essential hypertension    Bronchitis    Arthritis    Dysphagia    H/O hiatal hernia    Primary cancer of upper outer quadrant of left female breast ([place]) 12/01/2011    Past Surgical History:  Procedure Laterality Date   APPENDECTOMY     bladder tack     BREAST BIOPSY  1998   left    DILATION AND CURETTAGE OF UTERUS     ENDOSCOPIC RETROGRADE CHOLANGIOPANCREATOGRAPHY (ERCP) WITH PROPOFOL N/A 02/01/2014   Procedure: ENDOSCOPIC RETROGRADE CHOLANGIOPANCREATOGRAPHY (ERCP) WITH PROPOFOL;  Surgeon: [name], MD;  Location: [facility];  Service: Endoscopy;  Laterality: N/A;   ESOPHAGOGASTRODUODENOSCOPY     ESOPHAGOGASTRODUODENOSCOPY N/A 06/26/2016   Procedure: ESOPHAGOGASTRODUODENOSCOPY (EGD);  Surgeon: [name], MD;  Location: [facility];  Service: Endoscopy;  Laterality: N/A;   ESOPHAGOGASTRODUODENOSCOPY (EGD) WITH PROPOFOL  12/19/2013   Procedure: ESOPHAGOGASTRODUODENOSCOPY (EGD) WITH PROPOFOL;  Surgeon: Beryle Beams, MD;  Location: [facility];  Service: Endoscopy;;   ESOPHAGOGASTRODUODENOSCOPY (EGD) WITH PROPOFOL N/A 09/10/2016   Procedure: ESOPHAGOGASTRODUODENOSCOPY (EGD) WITH PROPOFOL;  Surgeon: [name], MD;  [facility];  Service: Endoscopy;  Laterality: N/A;   EUS  06/04/2011   Procedure: UPPER ENDOSCOPIC ULTRASOUND (EUS) LINEAR;  Surgeon: Owens Loffler, MD;  [facility]: [facility];  Service: Endoscopy;  Laterality: N/A;   EUS N/A 02/01/2014   Procedure: UPPER ENDOSCOPIC ULTRASOUND (EUS) LINEAR;  Surgeon: [name], MD;  [facility];  Service: Endoscopy;  Laterality: N/A;   EXPLORATORY LAPAROTOMY      biopsy of intra-abdominal mass   INTRAMEDULLARY (IM) NAIL INTERTROCHANTERIC Right 10/24/2018   Procedure: INTRAMEDULLARY (IM) NAIL INTERTROCHANTRIC;  Surgeon: Gaynelle Arabian, MD;  Location: [facility];  Service: Orthopedics;  Laterality: Right;   LAPAROTOMY N/A 11/17/2016   Procedure: EXPLORATORY LAPAROTOMY WITH LYSIS OF ADHESIONS, SMALL BOWEL RESECTION, REPAIR OF INCARCERATED VENTRAL INCISIONAL HERNIA, INSERTION OF BIOLOGIC MESH PATCH,APPLICATION OF WOUND VAC DRESSING;  Surgeon: Armandina Gemma, MD;  Location: [facility];  Service: General;  Laterality: N/A;   MASTECTOMY W/ SENTINEL NODE BIOPSY  12/18/2011   Procedure: MASTECTOMY WITH SENTINEL LYMPH NODE BIOPSY;  Surgeon: Rolm Bookbinder, MD;  Location: [facility];  Service: General;  Laterality: Left;   PORT-A-CATH REMOVAL Right 06/15/2013   Procedure: REMOVAL PORT-A-CATH;  Surgeon: Rolm Bookbinder, MD;  Location: [facility];  Service: General;  Laterality: Right;   PORTACATH PLACEMENT  01/27/2012   Procedure: INSERTION PORT-A-CATH;  Surgeon: Rolm Bookbinder, MD;  Location: [facility];  Service: General;  Laterality: Right;  PORT PLACEMENT   TOTAL HIP ARTHROPLASTY Right 11/14/2018   Procedure: REMOVAL OF INTRAMEDULLARY NAIL AND POSTERIOR TOTAL HIP ARTHROPLASTY;  Surgeon: Gaynelle Arabian, MD;  Location: [facility];  Service: Orthopedics;  Laterality: Right;  TOTAL SHOULDER REPLACEMENT  2011   left   TUBAL LIGATION       OB History   No obstetric history on file.     Family History  Problem Relation Age of Onset   Prostate cancer Father    Breast cancer Other     Social History   Tobacco Use   Smoking status: Never   Smokeless tobacco: Never  Vaping Use   Vaping Use: Never used  Substance Use Topics   Alcohol use: No   Drug use: No    Home Medications Prior to Admission medications   Medication Sig Start Date End Date Taking? Authorizing Provider  acetaminophen (TYLENOL) 500 MG tablet Take 1,000 mg by mouth every 6 (six) hours as needed (pain).    [provider]  atorvastatin (LIPITOR) 40 MG tablet Take 40 mg by mouth daily.  04/27/16   [provider]   citalopram (CELEXA) 20 MG tablet Take 1 tablet (20 mg total) by mouth daily. 11/01/18   Regalado, Belkys A, MD  Cyanocobalamin (VITAMIN B-12 PO) Take 1 tablet by mouth daily.    [provider]  ferrous sulfate 325 (65 FE) MG tablet Take 1 tablet (325 mg total) by mouth 2 (two) times daily with a meal. Patient taking differently: Take 325 mg by mouth daily. 11/01/18   Regalado, Belkys A, MD  gabapentin (NEURONTIN) 300 MG capsule TAKE 1 CAPSULE BY MOUTH EVERYDAY AT BEDTIME 11/08/20   Nicholas Lose, MD  Multiple Vitamin (MULTIVITAMIN WITH MINERALS) TABS tablet Take 1 tablet by mouth daily.    [provider]  omeprazole (PRILOSEC) 40 MG capsule TAKE 1 CAPSULE (40 MG TOTAL) BY MOUTH 2 (TWO) TIMES DAILY BEFORE A MEAL. 05/20/18   [name], MD  traMADol (ULTRAM) 50 MG tablet Take 2 tablets (100 mg total) by mouth 4 (four) times daily. 11/16/18   Edmisten, Kristie L, PA  valsartan-hydrochlorothiazide (DIOVAN-HCT) 80-12.5 MG tablet Take 1 tablet by mouth daily.    [provider]  Vitamin D, Ergocalciferol, (DRISDOL) 50000 units CAPS capsule Take 50,000 Units by mouth every Thursday.     [provider]    Allergies    Patient has no known allergies.  Review of Systems   Review of Systems  Constitutional:  Negative for chills and fever.  HENT:  Negative for ear pain and sore throat.   Eyes:  Negative for pain and visual disturbance.  Respiratory:  Positive for shortness of breath. Negative for cough.   Cardiovascular:  Negative for chest pain and palpitations.  Gastrointestinal:  Negative for abdominal pain and vomiting.  Genitourinary:  Negative for dysuria and hematuria.  Musculoskeletal:  Negative for arthralgias and back pain.  Skin:  Negative for color change and rash.  Neurological:  Positive for syncope. Negative for seizures.  All other systems reviewed and are negative.  Physical Exam Updated Vital Signs BP (!) 111/54   Pulse 89   Temp 98.7  F (37.1 C) (Oral)   Resp (!) 23   Ht 5\' 1"  (1.549 m)   Wt 60.3 kg   SpO2 97%   BMI 25.13 kg/m   Physical Exam Vitals and nursing note reviewed.  Constitutional:      General: She is not in acute distress.    Appearance: She is well-developed.  HENT:     Head: Normocephalic and atraumatic.  Eyes:     Conjunctiva/sclera: Conjunctivae normal.  Cardiovascular:     Rate and Rhythm: Normal rate and  regular rhythm.     Heart sounds: No murmur heard. Pulmonary:     Effort: Pulmonary effort is normal. No respiratory distress.     Breath sounds: Normal breath sounds.  Abdominal:     Palpations: Abdomen is soft.     Tenderness: There is no abdominal tenderness.  Musculoskeletal:     Cervical back: Neck supple.  Skin:    General: Skin is warm and dry.     Comments: 1 cm scalp laceration over the frontoparietal bone on the left, bleeding controlled  Neurological:     Mental Status: She is alert.    ED Results / Procedures / Treatments   Labs (all labs ordered are listed, but only abnormal results are displayed) Labs Reviewed  COMPREHENSIVE METABOLIC PANEL - Abnormal; Notable for the following components:      Result Value   Glucose, Bld 153 (*)    BUN 26 (*)    Creatinine, Ser 1.29 (*)    Albumin 3.1 (*)    GFR, Estimated 40 (*)    All other components within normal limits  CBC WITH DIFFERENTIAL/PLATELET - Abnormal; Notable for the following components:   WBC 16.0 (*)    RBC 3.16 (*)    Hemoglobin 9.6 (*)    HCT 29.7 (*)    Neutro Abs 13.5 (*)    Abs Immature Granulocytes 0.10 (*)    All other components within normal limits  TROPONIN I (HIGH SENSITIVITY) - Abnormal; Notable for the following components:   Troponin I (High Sensitivity) 54 (*)    All other components within normal limits  RESP PANEL BY RT-PCR (FLU A&B, COVID) ARPGX2  LACTIC ACID, PLASMA  URINALYSIS, ROUTINE W REFLEX MICROSCOPIC  TROPONIN I (HIGH SENSITIVITY)    EKG EKG  Interpretation  Date/Time:  Monday May 05 2021 14:57:54 EDT Ventricular Rate:  100 PR Interval:  147 QRS Duration: 100 QT Interval:  342 QTC Calculation: 442 R Axis:   71 Text Interpretation: Sinus tachycardia Left ventricular hypertrophy ST elevation in v1, no reciprocal ST deppressions Confirmed by [place] ([id number]) on 05/05/2021 4:05:01 PM  Radiology DG Chest 2 View  Result Date: 05/05/2021 CLINICAL DATA:  Dyspnea.  Fall.  Cough. EXAM: CHEST - 2 VIEW COMPARISON:  Two-view chest x-ray 11/19/2020 FINDINGS: The heart is enlarged. Atherosclerotic changes are noted at the aortic arch. Changes of COPD are present. Medial right lower lobe airspace disease is present. The upper lung fields are clear. Left shoulder arthroplasty present. Advanced degenerative changes again noted in the right shoulder. IMPRESSION: 1. Medial right lower lobe airspace disease concerning for pneumonia. 2. Stable cardiomegaly without failure. 3. Changes of COPD. Electronically Signed   By: San Morelle M.D.   On: 05/05/2021 16:06   CT Head Wo Contrast  Result Date: 05/05/2021 CLINICAL DATA:  Head trauma, mod-severe EXAM: CT HEAD WITHOUT CONTRAST TECHNIQUE: Contiguous axial images were obtained from the base of the skull through the vertex without intravenous contrast. COMPARISON:  11/19/2020 FINDINGS: Brain: There is no acute intracranial hemorrhage, mass effect, or edema. No new loss of gray-white differentiation. There is no extra-axial fluid collection. Prominence of the ventricles and sulci reflects stable parenchymal volume loss. Confluent areas of low-density in the supratentorial white matter are nonspecific but probably reflect stable moderate to marked chronic microvascular ischemic changes. Small chronic right occipital infarct. Vascular: There is atherosclerotic calcification at the skull base. Skull: Calvarium is unremarkable. Sinuses/Orbits: No acute finding. Other: None. IMPRESSION: No  evidence of acute intracranial injury.  Electronically Signed   By: Macy Mis M.D.   On: 05/05/2021 16:05   CT Cervical Spine Wo Contrast  Result Date: 05/05/2021 CLINICAL DATA:  Neck trauma (Age >= 65y) EXAM: CT CERVICAL SPINE WITHOUT CONTRAST TECHNIQUE: Multidetector CT imaging of the cervical spine was performed without intravenous contrast. Multiplanar CT image reconstructions were also generated. COMPARISON:  None. FINDINGS: Alignment: Mild multilevel degenerative listhesis. Skull base and vertebrae: Diffuse degenerative endplate irregularity. No acute fracture. Soft tissues and spinal canal: No prevertebral fluid or swelling. No visible canal hematoma. Disc levels: Multilevel degenerative changes are present including disc space narrowing, endplate osteophytes, and facet and uncovertebral hypertrophy. Upper chest: No apical lung mass. Other: Calcified plaque at the common carotid bifurcations. Retropharyngeal course carotids. IMPRESSION: No acute cervical spine fracture. Electronically Signed   By: Macy Mis M.D.   On: 05/05/2021 16:23    Procedures .Marland KitchenLaceration Repair  Date/Time: 05/05/2021 6:44 PM Performed by: Teressa Lower, MD Authorized by: Teressa Lower, MD   Anesthesia:    Anesthesia method:  None Laceration details:    Location:  Scalp   Scalp location:  L temporal   Length (cm):  2 Treatment:    Amount of cleaning:  Standard   Irrigation method:  Pressure wash Skin repair:    Repair method:  Staples   Number of staples:  2 Post-procedure details:    Dressing:  Open (no dressing)   Procedure completion:  Tolerated   Medications Ordered in ED Medications  azithromycin (ZITHROMAX) 500 mg in sodium chloride 0.9 % 250 mL IVPB (500 mg Intravenous New Bag/Given 05/05/21 1711)  cefTRIAXone (ROCEPHIN) 2 g in sodium chloride 0.9 % 100 mL IVPB (0 g Intravenous Stopped 05/05/21 1701)  lactated ringers bolus 500 mL (500 mLs Intravenous New Bag/Given 05/05/21 1727)     ED Course  I have reviewed the triage vital signs and the nursing notes.  Pertinent labs & imaging results that were available during my care of the patient were reviewed by me and considered in my medical decision making (see chart for details).    MDM Rules/Calculators/A&P                          Patient seen in the emergency department for evaluation of a fall after syncopal event and shortness of breath.  Physical exam reveals a 1 cm laceration over the frontotemporal region on the left with bleeding controlled.  Physical exam otherwise unremarkable.  Laboratory evaluation with a leukocytosis to 16.0, hemoglobin 9.6, creatinine 1.29, BUN 26, lactate normal at 1.8, initial troponin elevated to 54 suspect type II NSTEMI.  ECG nonischemic.  CT head unremarkable, CT C-spine unremarkable, chest x-ray with right lower lobe pneumonia.  Patient started on ceftriaxone azithromycin and given her new oxygen requirement in the setting of a new pneumonia, patient will require admission for IV antibiotics.  Patient then admitted.  Would benefit from an echo in the setting of her persistent shortness of breath and known severe aortic stenosis.  Final Clinical Impression(s) / ED Diagnoses Final diagnoses:  Community acquired pneumonia, unspecified laterality    Rx / DC Orders ED Discharge Orders     None        Alem Fahl, MD 05/05/21 1745    Teressa Lower, MD 05/05/21 2326    Teressa Lower, MD 05/05/21 [phone]

## 2021-05-06 ENCOUNTER — Inpatient Hospital Stay (HOSPITAL_COMMUNITY): Payer: Medicare Other

## 2021-05-06 DIAGNOSIS — J440 Chronic obstructive pulmonary disease with acute lower respiratory infection: Secondary | ICD-10-CM | POA: Diagnosis present

## 2021-05-06 DIAGNOSIS — F32A Depression, unspecified: Secondary | ICD-10-CM | POA: Diagnosis present

## 2021-05-06 DIAGNOSIS — S0101XA Laceration without foreign body of scalp, initial encounter: Secondary | ICD-10-CM | POA: Diagnosis present

## 2021-05-06 DIAGNOSIS — F0390 Unspecified dementia without behavioral disturbance: Secondary | ICD-10-CM | POA: Diagnosis present

## 2021-05-06 DIAGNOSIS — Z9012 Acquired absence of left breast and nipple: Secondary | ICD-10-CM | POA: Diagnosis not present

## 2021-05-06 DIAGNOSIS — J9621 Acute and chronic respiratory failure with hypoxia: Secondary | ICD-10-CM | POA: Diagnosis present

## 2021-05-06 DIAGNOSIS — M25512 Pain in left shoulder: Secondary | ICD-10-CM | POA: Diagnosis present

## 2021-05-06 DIAGNOSIS — I350 Nonrheumatic aortic (valve) stenosis: Secondary | ICD-10-CM | POA: Diagnosis present

## 2021-05-06 DIAGNOSIS — Z87442 Personal history of urinary calculi: Secondary | ICD-10-CM | POA: Diagnosis not present

## 2021-05-06 DIAGNOSIS — I10 Essential (primary) hypertension: Secondary | ICD-10-CM | POA: Diagnosis present

## 2021-05-06 DIAGNOSIS — R55 Syncope and collapse: Secondary | ICD-10-CM

## 2021-05-06 DIAGNOSIS — Z79899 Other long term (current) drug therapy: Secondary | ICD-10-CM | POA: Diagnosis not present

## 2021-05-06 DIAGNOSIS — J189 Pneumonia, unspecified organism: Secondary | ICD-10-CM | POA: Diagnosis present

## 2021-05-06 DIAGNOSIS — Z803 Family history of malignant neoplasm of breast: Secondary | ICD-10-CM | POA: Diagnosis not present

## 2021-05-06 DIAGNOSIS — Z20822 Contact with and (suspected) exposure to covid-19: Secondary | ICD-10-CM | POA: Diagnosis present

## 2021-05-06 DIAGNOSIS — R2681 Unsteadiness on feet: Secondary | ICD-10-CM | POA: Diagnosis present

## 2021-05-06 DIAGNOSIS — Z96641 Presence of right artificial hip joint: Secondary | ICD-10-CM | POA: Diagnosis present

## 2021-05-06 DIAGNOSIS — Z923 Personal history of irradiation: Secondary | ICD-10-CM | POA: Diagnosis not present

## 2021-05-06 DIAGNOSIS — N179 Acute kidney failure, unspecified: Secondary | ICD-10-CM | POA: Diagnosis present

## 2021-05-06 DIAGNOSIS — E785 Hyperlipidemia, unspecified: Secondary | ICD-10-CM | POA: Diagnosis present

## 2021-05-06 DIAGNOSIS — Z8042 Family history of malignant neoplasm of prostate: Secondary | ICD-10-CM | POA: Diagnosis not present

## 2021-05-06 DIAGNOSIS — K219 Gastro-esophageal reflux disease without esophagitis: Secondary | ICD-10-CM | POA: Diagnosis present

## 2021-05-06 DIAGNOSIS — Z66 Do not resuscitate: Secondary | ICD-10-CM | POA: Diagnosis present

## 2021-05-06 DIAGNOSIS — Y92009 Unspecified place in unspecified non-institutional (private) residence as the place of occurrence of the external cause: Secondary | ICD-10-CM | POA: Diagnosis not present

## 2021-05-06 DIAGNOSIS — W1811XA Fall from or off toilet without subsequent striking against object, initial encounter: Secondary | ICD-10-CM | POA: Diagnosis present

## 2021-05-06 DIAGNOSIS — Z9181 History of falling: Secondary | ICD-10-CM | POA: Diagnosis not present

## 2021-05-06 LAB — COMPREHENSIVE METABOLIC PANEL
ALT: 11 U/L (ref 0–44)
AST: 14 U/L — ABNORMAL LOW (ref 15–41)
Albumin: 2.8 g/dL — ABNORMAL LOW (ref 3.5–5.0)
Alkaline Phosphatase: 81 U/L (ref 38–126)
Anion gap: 7 (ref 5–15)
BUN: 26 mg/dL — ABNORMAL HIGH (ref 8–23)
CO2: 24 mmol/L (ref 22–32)
Calcium: 8.4 mg/dL — ABNORMAL LOW (ref 8.9–10.3)
Chloride: 106 mmol/L (ref 98–111)
Creatinine, Ser: 0.86 mg/dL (ref 0.44–1.00)
GFR, Estimated: >60 mL/min (ref >60)
Glucose, Bld: 119 mg/dL — ABNORMAL HIGH (ref 70–99)
Potassium: 3.8 mmol/L (ref 3.5–5.1)
Sodium: 137 mmol/L (ref 135–145)
Total Bilirubin: 0.4 mg/dL (ref 0.3–1.2)
Total Protein: 6.2 g/dL — ABNORMAL LOW (ref 6.5–8.1)

## 2021-05-06 LAB — CBC WITH DIFFERENTIAL/PLATELET
Abs Immature Granulocytes: 0.05 10*3/uL (ref 0.00–0.07)
Basophils Absolute: 0 10*3/uL (ref 0.0–0.1)
Basophils Relative: 0 %
Eosinophils Absolute: 0.1 10*3/uL (ref 0.0–0.5)
Eosinophils Relative: 1 %
HCT: 28.3 % — ABNORMAL LOW (ref 36.0–46.0)
Hemoglobin: 9 g/dL — ABNORMAL LOW (ref 12.0–15.0)
Immature Granulocytes: 0 %
Lymphocytes Relative: 15 %
Lymphs Abs: 1.7 10*3/uL (ref 0.7–4.0)
MCH: 30.4 pg (ref 26.0–34.0)
MCHC: 31.8 g/dL (ref 30.0–36.0)
MCV: 95.6 fL (ref 80.0–100.0)
Monocytes Absolute: 0.5 10*3/uL (ref 0.1–1.0)
Monocytes Relative: 4 %
Neutro Abs: 9 10*3/uL — ABNORMAL HIGH (ref 1.7–7.7)
Neutrophils Relative %: 80 %
Platelets: 224 10*3/uL (ref 150–400)
RBC: 2.96 MIL/uL — ABNORMAL LOW (ref 3.87–5.11)
RDW: 14 % (ref 11.5–15.5)
WBC: 11.5 10*3/uL — ABNORMAL HIGH (ref 4.0–10.5)
nRBC: 0 % (ref 0.0–0.2)

## 2021-05-06 LAB — ECHOCARDIOGRAM COMPLETE
AR max vel: 0.88 cm2
AV Area VTI: 0.76 cm2
AV Area mean vel: 0.78 cm2
AV Mean grad: 34.5 mmHg
AV Peak grad: 56.4 mmHg
Ao pk vel: 3.76 m/s
Area-P 1/2: 1.68 cm2
Height: 61 in
MV VTI: 1.56 cm2
S' Lateral: 2.7 cm
Weight: 2128 oz

## 2021-05-06 LAB — TROPONIN I (HIGH SENSITIVITY)
Troponin I (High Sensitivity): 31 ng/L — ABNORMAL HIGH (ref <18)
Troponin I (High Sensitivity): 31 ng/L — ABNORMAL HIGH (ref <18)

## 2021-05-06 LAB — BRAIN NATRIURETIC PEPTIDE: B Natriuretic Peptide: 143.4 pg/mL — ABNORMAL HIGH (ref 0.0–100.0)

## 2021-05-06 MED ORDER — SODIUM CHLORIDE 0.9 % IV SOLN: Freq: Once | INTRAVENOUS | Status: DC

## 2021-05-06 MED ORDER — ACETAMINOPHEN 650 MG RE SUPP: 650.0000 mg | Freq: Four times a day (QID) | RECTAL | Status: DC | PRN

## 2021-05-06 MED ORDER — ONDANSETRON HCL 4 MG PO TABS: 4.0000 mg | ORAL_TABLET | Freq: Four times a day (QID) | ORAL | Status: DC | PRN

## 2021-05-06 MED ORDER — SODIUM CHLORIDE 0.9 % IV SOLN: Freq: Once | INTRAVENOUS | Status: AC

## 2021-05-06 MED ORDER — ENOXAPARIN SODIUM 30 MG/0.3ML IJ SOSY
30.0000 mg | PREFILLED_SYRINGE | INTRAMUSCULAR | Status: DC
  Filled 2021-05-06: qty 0.3

## 2021-05-06 MED ORDER — SODIUM CHLORIDE 0.9 % IV SOLN
1.0000 g | INTRAVENOUS | Status: DC
  Administered 2021-05-06 – 2021-05-08 (×3): 1 g via INTRAVENOUS
  Filled 2021-05-06 (×4): qty 10

## 2021-05-06 MED ORDER — IPRATROPIUM-ALBUTEROL 0.5-2.5 (3) MG/3ML IN SOLN: 3.0000 mL | Freq: Four times a day (QID) | RESPIRATORY_TRACT | Status: DC | PRN

## 2021-05-06 MED ORDER — AZITHROMYCIN 250 MG PO TABS
500.0000 mg | ORAL_TABLET | Freq: Every day | ORAL | Status: DC
  Administered 2021-05-06 – 2021-05-08 (×3): 500 mg via ORAL
  Filled 2021-05-06 (×3): qty 2

## 2021-05-06 MED ORDER — ONDANSETRON HCL 4 MG/2ML IJ SOLN: 4.0000 mg | Freq: Four times a day (QID) | INTRAMUSCULAR | Status: DC | PRN

## 2021-05-06 MED ORDER — ACETAMINOPHEN 500 MG PO TABS
500.0000 mg | ORAL_TABLET | Freq: Once | ORAL | Status: AC
  Administered 2021-05-06: 500 mg via ORAL
  Filled 2021-05-06: qty 1

## 2021-05-06 MED ORDER — GUAIFENESIN ER 600 MG PO TB12
600.0000 mg | ORAL_TABLET | Freq: Two times a day (BID) | ORAL | Status: DC
  Administered 2021-05-06 – 2021-05-09 (×6): 600 mg via ORAL
  Filled 2021-05-06 (×6): qty 1

## 2021-05-06 MED ORDER — TRAMADOL HCL 50 MG PO TABS
50.0000 mg | ORAL_TABLET | Freq: Four times a day (QID) | ORAL | Status: DC | PRN
  Administered 2021-05-06 – 2021-05-08 (×5): 50 mg via ORAL
  Filled 2021-05-06 (×5): qty 1

## 2021-05-06 MED ORDER — ENOXAPARIN SODIUM 40 MG/0.4ML IJ SOSY
40.0000 mg | PREFILLED_SYRINGE | INTRAMUSCULAR | Status: DC
  Administered 2021-05-06 – 2021-05-08 (×3): 40 mg via SUBCUTANEOUS
  Filled 2021-05-06 (×3): qty 0.4

## 2021-05-06 MED ORDER — SODIUM CHLORIDE 0.9% FLUSH
3.0000 mL | Freq: Two times a day (BID) | INTRAVENOUS | Status: DC
  Administered 2021-05-06 – 2021-05-08 (×4): 3 mL via INTRAVENOUS

## 2021-05-06 MED ORDER — ACETAMINOPHEN 325 MG PO TABS
650.0000 mg | ORAL_TABLET | Freq: Four times a day (QID) | ORAL | Status: DC | PRN
  Administered 2021-05-06: 650 mg via ORAL
  Filled 2021-05-06: qty 2

## 2021-05-06 NOTE — Progress Notes (Signed)
Echocardiogram 2D Echocardiogram has been performed.  Brandy [name] M 05/06/2021, 12:03 PM

## 2021-05-06 NOTE — H&P (Addendum)
History and Physical    Brandy [name] KGY:[id number] DOB: 1933/05/12 DOA: 05/05/2021  PCP: Kathyrn Lass, MD  Patient coming from: Home  Chief Complaint: falls  HPI: Brandy [name] is a 87 y.o. female with medical history significant of HTN, HLD, dementia. Hx from daughter as patient seems somewhat confused. Daughter reports patient has been falling at home multiple times recently. She had a fall yesterday. When she woke up yesterday, her legs seemed wobbly. She was assisted to the bathroom and apparently fell of the toilet and hit her head. She was out for s few seconds. Family was able to assist her up and get her back to bed. The decided to bring her to the ED to be evaluated. Famiyl reports that the patient has also had several days of cough and shortness of breath. Her condition is exacerbated when she tries to do any activity. They deny any other aggravating or alleviating factors.    ED Course: She has mildly elevated trp. CXR showed RLL PNA. She was started on rocephin/zithro. TRH was called for admission.   Review of Systems:  Denies CP, abdominal pain, palpitations, N/V/D, fevers. Review of systems is otherwise negative for all not mentioned in HPI.   PMHx Past Medical History:  Diagnosis Date   Anemia    Arthritis    shoulders   Asthma    mild, no inhalers used   Breast cancer ([place]) 12/18/11   left breast masectomy=metastatic ca in (1/1) lymph node ,invasive ductal ca,2 foci,,dcis,lymph ovascular invasion identified,surgical resection margins neg for ca,additional tissue=benign skin and subcutaneous tissue   Bronchitis    hx of;last time >1yr ago   Depression    takes Celexa daily   Difficult intubation    Dysphagia    Full dentures    Gall stones 2015   GERD (gastroesophageal reflux disease)    takes Prilosec prn   H/O hiatal hernia    History of kidney stones    many yrs ago   Hx of radiation therapy 04/26/12 -06/10/12   left breast   Hyperlipidemia    takes  Simvastatin daily   Hypertension    takes Amlodipine and Diovan daily   Insomnia    takes Ambien nightly   Urinary urgency     PSHx Past Surgical History:  Procedure Laterality Date   APPENDECTOMY     bladder tack     BREAST BIOPSY  1998   left    DILATION AND CURETTAGE OF UTERUS     ENDOSCOPIC RETROGRADE CHOLANGIOPANCREATOGRAPHY (ERCP) WITH PROPOFOL N/A 02/01/2014   Procedure: ENDOSCOPIC RETROGRADE CHOLANGIOPANCREATOGRAPHY (ERCP) WITH PROPOFOL;  Surgeon: [name], MD;  Location: [facility];  Service: Endoscopy;  Laterality: N/A;   ESOPHAGOGASTRODUODENOSCOPY     ESOPHAGOGASTRODUODENOSCOPY N/A 06/26/2016   Procedure: ESOPHAGOGASTRODUODENOSCOPY (EGD);  Surgeon: [name], MD;  Location: [facility];  Service: Endoscopy;  Laterality: N/A;   ESOPHAGOGASTRODUODENOSCOPY (EGD) WITH PROPOFOL  12/19/2013   Procedure: ESOPHAGOGASTRODUODENOSCOPY (EGD) WITH PROPOFOL;  Surgeon: Beryle Beams, MD;  Location: [facility];  Service: Endoscopy;;   ESOPHAGOGASTRODUODENOSCOPY (EGD) WITH PROPOFOL N/A 09/10/2016   Procedure: ESOPHAGOGASTRODUODENOSCOPY (EGD) WITH PROPOFOL;  Surgeon: [name], MD;  Location: [facility];  Service: Endoscopy;  Laterality: N/A;   EUS  06/04/2011   Procedure: UPPER ENDOSCOPIC ULTRASOUND (EUS) LINEAR;  Surgeon: Owens Loffler, MD;  Location: [facility];  Service: Endoscopy;  Laterality: N/A;   EUS N/A 02/01/2014   Procedure: UPPER ENDOSCOPIC ULTRASOUND (EUS) LINEAR;  Surgeon: Melene Plan  Ardis Hughs, MD;  Location: [facility];  Service: Endoscopy;  Laterality: N/A;   EXPLORATORY LAPAROTOMY      biopsy of intra-abdominal mass   INTRAMEDULLARY (IM) NAIL INTERTROCHANTERIC Right 10/24/2018   Procedure: INTRAMEDULLARY (IM) NAIL INTERTROCHANTRIC;  Surgeon: Gaynelle Arabian, MD;  Location: [facility];  Service: Orthopedics;  Laterality: Right;   LAPAROTOMY N/A 11/17/2016   Procedure: EXPLORATORY LAPAROTOMY WITH LYSIS OF ADHESIONS, SMALL BOWEL RESECTION, REPAIR OF  INCARCERATED VENTRAL INCISIONAL HERNIA, INSERTION OF BIOLOGIC MESH PATCH,APPLICATION OF WOUND VAC DRESSING;  Surgeon: Armandina Gemma, MD;  Location: [facility];  Service: General;  Laterality: N/A;   MASTECTOMY W/ SENTINEL NODE BIOPSY  12/18/2011   Procedure: MASTECTOMY WITH SENTINEL LYMPH NODE BIOPSY;  Surgeon: Rolm Bookbinder, MD;  Location: [facility];  Service: General;  Laterality: Left;   PORT-A-CATH REMOVAL Right 06/15/2013   Procedure: REMOVAL PORT-A-CATH;  Surgeon: Rolm Bookbinder, MD;  Location: [facility];  Service: General;  Laterality: Right;   PORTACATH PLACEMENT  01/27/2012   Procedure: INSERTION PORT-A-CATH;  Surgeon: Rolm Bookbinder, MD;  Location: [facility];  Service: General;  Laterality: Right;  PORT PLACEMENT   TOTAL HIP ARTHROPLASTY Right 11/14/2018   Procedure: REMOVAL OF INTRAMEDULLARY NAIL AND POSTERIOR TOTAL HIP ARTHROPLASTY;  Surgeon: Gaynelle Arabian, MD;  Location: [facility];  Service: Orthopedics;  Laterality: Right;   TOTAL SHOULDER REPLACEMENT  2011   left   TUBAL LIGATION      SocHx  reports that she has never smoked. She has never used smokeless tobacco. She reports that she does not drink alcohol and does not use drugs.  No Known Allergies  FamHx Family History  Problem Relation Age of Onset   Prostate cancer Father    Breast cancer Other     Prior to Admission medications   Medication Sig Start Date End Date Taking? Authorizing Provider  acetaminophen (TYLENOL) 500 MG tablet Take 1,000 mg by mouth every 6 (six) hours as needed (pain).    [provider]  atorvastatin (LIPITOR) 40 MG tablet Take 40 mg by mouth daily.  04/27/16   [provider]  citalopram (CELEXA) 20 MG tablet Take 1 tablet (20 mg total) by mouth daily. 11/01/18   Regalado, Belkys A, MD  Cyanocobalamin (VITAMIN B-12 PO) Take 1 tablet by mouth daily.    [provider]  ferrous sulfate 325 (65 FE) MG tablet Take 1 tablet (325 mg total) by  mouth 2 (two) times daily with a meal. Patient taking differently: Take 325 mg by mouth daily. 11/01/18   Regalado, Belkys A, MD  gabapentin (NEURONTIN) 300 MG capsule TAKE 1 CAPSULE BY MOUTH EVERYDAY AT BEDTIME 11/08/20   Nicholas Lose, MD  Multiple Vitamin (MULTIVITAMIN WITH MINERALS) TABS tablet Take 1 tablet by mouth daily.    [provider]  omeprazole (PRILOSEC) 40 MG capsule TAKE 1 CAPSULE (40 MG TOTAL) BY MOUTH 2 (TWO) TIMES DAILY BEFORE A MEAL. 05/20/18   [name], MD  traMADol (ULTRAM) 50 MG tablet Take 2 tablets (100 mg total) by mouth 4 (four) times daily. 11/16/18   Edmisten, Kristie L, PA  valsartan-hydrochlorothiazide (DIOVAN-HCT) 80-12.5 MG tablet Take 1 tablet by mouth daily.    [provider]  Vitamin D, Ergocalciferol, (DRISDOL) 50000 units CAPS capsule Take 50,000 Units by mouth every Thursday.     [provider]    Physical Exam: Vitals:   05/06/21 0500 05/06/21 0600 05/06/21 0700 05/06/21 0800  BP: (!) 144/68 (!) 156/56 (!) 150/54 Marland Kitchen)  120/47  Pulse: 79 66 82 77  Resp: 19 (!) 24 (!) 24 20  Temp:    (!) 97.5 F (36.4 C)  TempSrc:      SpO2: 98% 100% 98% 96%  Weight:      Height:        General: 88 y.o. female resting in bed in NAD Eyes: PERRL, normal sclera ENMT: Nares patent w/o discharge, orophaynx clear, dentition normal, ears w/o discharge/lesions/ulcers Neck: Supple, trachea midline Cardiovascular: RRR, +S1, S2, no g/r, 3/6 SEM equal pulses throughout Respiratory: somewhat decreased at bases, no w/r/r, normal WOB GI: BS+, NDNT, no masses noted, no organomegaly noted MSK: No e/c/c Neuro: A&O x 3, no focal deficits Psyc: pleasantly confused, calm/cooperative  Labs on Admission: I have personally reviewed following labs and imaging studies  CBC: Recent Labs  Lab 05/05/21 1532  WBC 16.0*  NEUTROABS 13.5*  HGB 9.6*  HCT 29.7*  MCV 94.0  PLT 109   Basic Metabolic Panel: Recent Labs  Lab 05/05/21 1532  NA 137  K  4.3  CL 105  CO2 22  GLUCOSE 153*  BUN 26*  CREATININE 1.29*  CALCIUM 8.9   GFR: Estimated Creatinine Clearance: 26.1 mL/min (A) (by C-G formula based on SCr of 1.29 mg/dL (H)). Liver Function Tests: Recent Labs  Lab 05/05/21 1532  AST 17  ALT 12  ALKPHOS 86  BILITOT 0.6  PROT 6.6  ALBUMIN 3.1*   No results for input(s): LIPASE, AMYLASE in the last 168 hours. No results for input(s): AMMONIA in the last 168 hours. Coagulation Profile: No results for input(s): INR, PROTIME in the last 168 hours. Cardiac Enzymes: No results for input(s): CKTOTAL, CKMB, CKMBINDEX, TROPONINI in the last 168 hours. BNP (last 3 results) No results for input(s): PROBNP in the last 8760 hours. HbA1C: No results for input(s): HGBA1C in the last 72 hours. CBG: No results for input(s): GLUCAP in the last 168 hours. Lipid Profile: No results for input(s): CHOL, HDL, LDLCALC, TRIG, CHOLHDL, LDLDIRECT in the last 72 hours. Thyroid Function Tests: No results for input(s): TSH, T4TOTAL, FREET4, T3FREE, THYROIDAB in the last 72 hours. Anemia Panel: No results for input(s): VITAMINB12, FOLATE, FERRITIN, TIBC, IRON, RETICCTPCT in the last 72 hours. Urine analysis:    Component Value Date/Time   COLORURINE YELLOW 11/19/2020 1421   APPEARANCEUR CLEAR 11/19/2020 1421   LABSPEC 1.015 11/19/2020 1421   PHURINE 5.5 11/19/2020 1421   GLUCOSEU NEGATIVE 11/19/2020 1421   HGBUR SMALL (A) 11/19/2020 1421   BILIRUBINUR NEGATIVE 11/19/2020 1421   KETONESUR NEGATIVE 11/19/2020 1421   PROTEINUR TRACE (A) 11/19/2020 1421   UROBILINOGEN 1.0 10/10/2009 1100   NITRITE NEGATIVE 11/19/2020 1421   LEUKOCYTESUR NEGATIVE 11/19/2020 1421    Radiological Exams on Admission: DG Chest 2 View  Result Date: 05/05/2021 CLINICAL DATA:  Dyspnea.  Fall.  Cough. EXAM: CHEST - 2 VIEW COMPARISON:  Two-view chest x-ray 11/19/2020 FINDINGS: The heart is enlarged. Atherosclerotic changes are noted at the aortic arch. Changes of  COPD are present. Medial right lower lobe airspace disease is present. The upper lung fields are clear. Left shoulder arthroplasty present. Advanced degenerative changes again noted in the right shoulder. IMPRESSION: 1. Medial right lower lobe airspace disease concerning for pneumonia. 2. Stable cardiomegaly without failure. 3. Changes of COPD. Electronically Signed   By: San Morelle M.D.   On: 05/05/2021 16:06   CT Head Wo Contrast  Result Date: 05/05/2021 CLINICAL DATA:  Head trauma, mod-severe EXAM: CT HEAD WITHOUT CONTRAST  TECHNIQUE: Contiguous axial images were obtained from the base of the skull through the vertex without intravenous contrast. COMPARISON:  11/19/2020 FINDINGS: Brain: There is no acute intracranial hemorrhage, mass effect, or edema. No new loss of gray-white differentiation. There is no extra-axial fluid collection. Prominence of the ventricles and sulci reflects stable parenchymal volume loss. Confluent areas of low-density in the supratentorial white matter are nonspecific but probably reflect stable moderate to marked chronic microvascular ischemic changes. Small chronic right occipital infarct. Vascular: There is atherosclerotic calcification at the skull base. Skull: Calvarium is unremarkable. Sinuses/Orbits: No acute finding. Other: None. IMPRESSION: No evidence of acute intracranial injury. Electronically Signed   By: Macy Mis M.D.   On: 05/05/2021 16:05   CT Cervical Spine Wo Contrast  Result Date: 05/05/2021 CLINICAL DATA:  Neck trauma (Age >= 65y) EXAM: CT CERVICAL SPINE WITHOUT CONTRAST TECHNIQUE: Multidetector CT imaging of the cervical spine was performed without intravenous contrast. Multiplanar CT image reconstructions were also generated. COMPARISON:  None. FINDINGS: Alignment: Mild multilevel degenerative listhesis. Skull base and vertebrae: Diffuse degenerative endplate irregularity. No acute fracture. Soft tissues and spinal canal: No prevertebral  fluid or swelling. No visible canal hematoma. Disc levels: Multilevel degenerative changes are present including disc space narrowing, endplate osteophytes, and facet and uncovertebral hypertrophy. Upper chest: No apical lung mass. Other: Calcified plaque at the common carotid bifurcations. Retropharyngeal course carotids. IMPRESSION: No acute cervical spine fracture. Electronically Signed   By: Macy Mis M.D.   On: 05/05/2021 16:23    EKG: Independently reviewed. Sinus, st elevation in V1, V2  Assessment/Plan RLL PNA Hypoxia     - admit to inpt, tele     - continue rocephin, zithro     - wean O2 as able     - PRN nebs     - guaifenesin  Falls Syncope?     - PT/OT eval     - orthostatics, echo     - checking EKG  AKI     - check renal US     - she is euvolemic     - fluids; gently  Elevated trp Hx of aortic stenosis DOE     - no chest pain     - trend trp, rpt ekg     - echo ordered  HLD     - statin  HTN     - resume home regimen  GERD     - PPI  Shoulder pain     - multiple visits to [facility] over last 2 weeks; get records     - she has follow up with ortho  DVT prophylaxis: lovenox  Code Status: DNR; confirmed w/ dtr by phone  Family Communication: spoke w/ dtr by phone   Consults called: None   Status is: Inpatient  Remains inpatient appropriate because: severity of illness  Jonnie Finner DO [facility]  If 7PM-7AM, please contact night-coverage [email or web address]  05/06/2021, 10:26 AM

## 2021-05-07 ENCOUNTER — Inpatient Hospital Stay (HOSPITAL_COMMUNITY): Payer: Medicare Other

## 2021-05-07 DIAGNOSIS — I350 Nonrheumatic aortic (valve) stenosis: Secondary | ICD-10-CM

## 2021-05-07 DIAGNOSIS — R55 Syncope and collapse: Secondary | ICD-10-CM

## 2021-05-07 DIAGNOSIS — J9621 Acute and chronic respiratory failure with hypoxia: Secondary | ICD-10-CM

## 2021-05-07 LAB — CBC
HCT: 29.9 % — ABNORMAL LOW (ref 36.0–46.0)
Hemoglobin: 9.3 g/dL — ABNORMAL LOW (ref 12.0–15.0)
MCH: 29.8 pg (ref 26.0–34.0)
MCHC: 31.1 g/dL (ref 30.0–36.0)
MCV: 95.8 fL (ref 80.0–100.0)
Platelets: 234 10*3/uL (ref 150–400)
RBC: 3.12 MIL/uL — ABNORMAL LOW (ref 3.87–5.11)
RDW: 13.9 % (ref 11.5–15.5)
WBC: 8.7 10*3/uL (ref 4.0–10.5)
nRBC: 0 % (ref 0.0–0.2)

## 2021-05-07 LAB — COMPREHENSIVE METABOLIC PANEL
ALT: 12 U/L (ref 0–44)
AST: 13 U/L — ABNORMAL LOW (ref 15–41)
Albumin: 2.7 g/dL — ABNORMAL LOW (ref 3.5–5.0)
Alkaline Phosphatase: 78 U/L (ref 38–126)
Anion gap: 8 (ref 5–15)
BUN: 19 mg/dL (ref 8–23)
CO2: 23 mmol/L (ref 22–32)
Calcium: 8.4 mg/dL — ABNORMAL LOW (ref 8.9–10.3)
Chloride: 107 mmol/L (ref 98–111)
Creatinine, Ser: 0.83 mg/dL (ref 0.44–1.00)
GFR, Estimated: >60 mL/min (ref >60)
Glucose, Bld: 103 mg/dL — ABNORMAL HIGH (ref 70–99)
Potassium: 3.6 mmol/L (ref 3.5–5.1)
Sodium: 138 mmol/L (ref 135–145)
Total Bilirubin: 0.3 mg/dL (ref 0.3–1.2)
Total Protein: 6.1 g/dL — ABNORMAL LOW (ref 6.5–8.1)

## 2021-05-07 LAB — GLUCOSE, CAPILLARY: Glucose-Capillary: 123 mg/dL — ABNORMAL HIGH (ref 70–99)

## 2021-05-07 MED ORDER — IRBESARTAN 75 MG PO TABS
75.0000 mg | ORAL_TABLET | Freq: Every day | ORAL | Status: DC
  Administered 2021-05-07 – 2021-05-09 (×3): 75 mg via ORAL
  Filled 2021-05-07 (×3): qty 1

## 2021-05-07 MED ORDER — ATORVASTATIN CALCIUM 40 MG PO TABS
40.0000 mg | ORAL_TABLET | Freq: Every day | ORAL | Status: DC
  Administered 2021-05-07 – 2021-05-09 (×3): 40 mg via ORAL
  Filled 2021-05-07 (×3): qty 1

## 2021-05-07 MED ORDER — VALSARTAN-HYDROCHLOROTHIAZIDE 80-12.5 MG PO TABS: 1.0000 | ORAL_TABLET | Freq: Every day | ORAL | Status: DC

## 2021-05-07 MED ORDER — HYDROCHLOROTHIAZIDE 12.5 MG PO TABS
12.5000 mg | ORAL_TABLET | Freq: Every day | ORAL | Status: DC
  Administered 2021-05-07 – 2021-05-09 (×3): 12.5 mg via ORAL
  Filled 2021-05-07 (×3): qty 1

## 2021-05-07 MED ORDER — GABAPENTIN 300 MG PO CAPS
300.0000 mg | ORAL_CAPSULE | Freq: Every day | ORAL | Status: DC
  Administered 2021-05-07 – 2021-05-08 (×2): 300 mg via ORAL
  Filled 2021-05-07 (×2): qty 1

## 2021-05-07 MED ORDER — DULOXETINE HCL 20 MG PO CPEP
20.0000 mg | ORAL_CAPSULE | Freq: Two times a day (BID) | ORAL | Status: DC
  Administered 2021-05-07 – 2021-05-09 (×4): 20 mg via ORAL
  Filled 2021-05-07 (×7): qty 1

## 2021-05-07 MED ORDER — SODIUM CHLORIDE 0.9% FLUSH
3.0000 mL | Freq: Two times a day (BID) | INTRAVENOUS | Status: DC
  Administered 2021-05-07 – 2021-05-09 (×3): 3 mL via INTRAVENOUS

## 2021-05-07 NOTE — H&P (View-Only) (Signed)
Cardiology Consultation:   Patient ID: [name] MRN: [id number]; DOB: Nov 21, 1932  Admit date: 05/05/2021 Date of Consult: 05/07/2021  PCP:  Kathyrn Lass, MD   [place] Providers Cardiologist:  None Dr. Meda Coffee >>> Dr. Gardiner Rhyme    Patient Profile:   Brandy [name] is a 87 y.o. female with a hx of  moderate aortic stenosis with mild AI, HTN, HLD invasive ductal carcinoma of her left breast 2013 who is being seen 05/07/2021 for the evaluation of syncope and AS at the request of Dr. Lonny Prude.  Patient was hospitalized in May 2018 with incarcerated incisional hernia status post resection and repair. She had a Lexi scan Myoview 11/17/16 preop that was normal without ischemia, LVEF 71%.   Last seen by Dr. Meda Coffee 08/2019.  Last echo 09/2019 with LVEF of 70-75%, grade 1 DD. Peak and mean  gradients through the value are 35 and 20 mm Hg.   History of Present Illness:   Ms. Brandy [name] brought in by daughter for evaluation of fall and shortness of breath 10/31.  After using commode, she stood up and fall forward.  Hit her head to ground.  Daughter got concerned and brought her here.  Patient is a very poor historian.  She uses walker for ambulation at home.  Patient with recent multiple history of falls.  Unsure if patient has lost consciousness or not.  No family at bedside currently.  She also reports progressive worsening shortness of breath with minimal ambulation.  No exertional chest tightness or pressure.  Denies dizziness, lower extremity edema, melena or blood in her stool or urine.  Upon arrival she was found hypoxic at oxygen saturation of 89% on room air.  Chest x-ray showed right lower lobe pneumonia and started on Rocephin and azithromycin.  Patient also complains of shoulder pain.  Given abnormal echocardiogram showing worsening aortic stenosis cardiology is asked for further evaluation.  Echo 05/06/2021 1. Left ventricular ejection fraction, by estimation, is 70 to 75%. The  left  ventricle has hyperdynamic function. The left ventricle has no  regional wall motion abnormalities. There is mild left ventricular  hypertrophy. Left ventricular diastolic  parameters are consistent with Grade I diastolic dysfunction (impaired  relaxation). Elevated left atrial pressure.   2. Right ventricular systolic function is normal. The right ventricular  size is normal.   3. Mild mitral valve regurgitation. Moderate to severe mitral annular  calcification.   4. AV is thickened, calcified with restricted motion. Peak and mean  gradients through the valve are 64 and 38 mm Hg respectively.  Dimensionless index is 0.27 Consistent with moderate to severe AS>  Compared to echo report from 2021, mean gradient is  increased (76m to 38 mm ) . The aortic valve is abnormal. Aortic valve  regurgitation is not visualized.   5. The inferior vena cava is normal in size with greater than 50%  respiratory variability, suggesting right atrial pressure of 3 mmHg.    Past Medical History:  Diagnosis Date   Anemia    Arthritis    shoulders   Asthma    mild, no inhalers used   Breast cancer ([facility] 12/18/11   left breast masectomy=metastatic ca in (1/1) lymph node ,invasive ductal ca,2 foci,,dcis,lymph ovascular invasion identified,surgical resection margins neg for ca,additional tissue=benign skin and subcutaneous tissue   Bronchitis    hx of;last time >186yrgo   Depression    takes Celexa daily   Difficult intubation    Dysphagia    Full  dentures    Gall stones 2015   GERD (gastroesophageal reflux disease)    takes Prilosec prn   H/O hiatal hernia    History of kidney stones    many yrs ago   Hx of radiation therapy 04/26/12 -06/10/12   left breast   Hyperlipidemia    takes Simvastatin daily   Hypertension    takes Amlodipine and Diovan daily   Insomnia    takes Ambien nightly   Urinary urgency     Past Surgical History:  Procedure Laterality Date   APPENDECTOMY     bladder  tack     BREAST BIOPSY  1998   left    DILATION AND CURETTAGE OF UTERUS     ENDOSCOPIC RETROGRADE CHOLANGIOPANCREATOGRAPHY (ERCP) WITH PROPOFOL N/A 02/01/2014   Procedure: ENDOSCOPIC RETROGRADE CHOLANGIOPANCREATOGRAPHY (ERCP) WITH PROPOFOL;  Surgeon: [name], MD;  Location: [facility];  Service: Endoscopy;  Laterality: N/A;   ESOPHAGOGASTRODUODENOSCOPY     ESOPHAGOGASTRODUODENOSCOPY N/A 06/26/2016   Procedure: ESOPHAGOGASTRODUODENOSCOPY (EGD);  Surgeon: [name], MD;  Location: [facility];  Service: Endoscopy;  Laterality: N/A;   ESOPHAGOGASTRODUODENOSCOPY (EGD) WITH PROPOFOL  12/19/2013   Procedure: ESOPHAGOGASTRODUODENOSCOPY (EGD) WITH PROPOFOL;  Surgeon: Beryle Beams, MD;  Location: [facility];  Service: Endoscopy;;   ESOPHAGOGASTRODUODENOSCOPY (EGD) WITH PROPOFOL N/A 09/10/2016   Procedure: ESOPHAGOGASTRODUODENOSCOPY (EGD) WITH PROPOFOL;  Surgeon: [name], MD;  Location: [facility];  Service: Endoscopy;  Laterality: N/A;   EUS  06/04/2011   Procedure: UPPER ENDOSCOPIC ULTRASOUND (EUS) LINEAR;  Surgeon: Owens Loffler, MD;  Location: [facility];  Service: Endoscopy;  Laterality: N/A;   EUS N/A 02/01/2014   Procedure: UPPER ENDOSCOPIC ULTRASOUND (EUS) LINEAR;  Surgeon: [name], MD;  Location: [facility];  Service: Endoscopy;  Laterality: N/A;   EXPLORATORY LAPAROTOMY      biopsy of intra-abdominal mass   INTRAMEDULLARY (IM) NAIL INTERTROCHANTERIC Right 10/24/2018   Procedure: INTRAMEDULLARY (IM) NAIL INTERTROCHANTRIC;  Surgeon: Gaynelle Arabian, MD;  Location: [facility];  Service: Orthopedics;  Laterality: Right;   LAPAROTOMY N/A 11/17/2016   Procedure: EXPLORATORY LAPAROTOMY WITH LYSIS OF ADHESIONS, SMALL BOWEL RESECTION, REPAIR OF INCARCERATED VENTRAL INCISIONAL HERNIA, INSERTION OF BIOLOGIC MESH PATCH,APPLICATION OF WOUND VAC DRESSING;  Surgeon: Armandina Gemma, MD;  Location: [facility];  Service: General;  Laterality: N/A;   MASTECTOMY W/ SENTINEL NODE BIOPSY   12/18/2011   Procedure: MASTECTOMY WITH SENTINEL LYMPH NODE BIOPSY;  Surgeon: Rolm Bookbinder, MD;  Location: [facility];  Service: General;  Laterality: Left;   PORT-A-CATH REMOVAL Right 06/15/2013   Procedure: REMOVAL PORT-A-CATH;  Surgeon: Rolm Bookbinder, MD;  Location: [facility];  Service: General;  Laterality: Right;   PORTACATH PLACEMENT  01/27/2012   Procedure: INSERTION PORT-A-CATH;  Surgeon: Rolm Bookbinder, MD;  Location: [facility];  Service: General;  Laterality: Right;  PORT PLACEMENT   TOTAL HIP ARTHROPLASTY Right 11/14/2018   Procedure: REMOVAL OF INTRAMEDULLARY NAIL AND POSTERIOR TOTAL HIP ARTHROPLASTY;  Surgeon: Gaynelle Arabian, MD;  Location: [facility];  Service: Orthopedics;  Laterality: Right;   TOTAL SHOULDER REPLACEMENT  2011   left   TUBAL LIGATION        Inpatient Medications: Scheduled Meds:  atorvastatin  40 mg Oral Daily   azithromycin  500 mg Oral Daily   DULoxetine  20 mg Oral BID   enoxaparin (LOVENOX) injection  40 mg Subcutaneous Q24H   gabapentin  300 mg Oral QHS   guaiFENesin  600 mg Oral BID   irbesartan  75 mg Oral Daily   And   hydrochlorothiazide  12.5 mg Oral Daily   sodium chloride flush  3 mL Intravenous Q12H   Continuous Infusions:  cefTRIAXone (ROCEPHIN)  IV Stopped (05/06/21 2054)   PRN Meds: acetaminophen **OR** acetaminophen, ipratropium-albuterol, ondansetron **OR** ondansetron (ZOFRAN) IV, traMADol  Allergies:   No Known Allergies  Social History:   Social History   Socioeconomic History   Marital status: Married    Spouse name: Not on file   Number of children: Not on file   Years of education: Not on file   Highest education level: Not on file  Occupational History   Occupation: retired  Tobacco Use   Smoking status: Never   Smokeless tobacco: Never  Vaping Use   Vaping Use: Never used  Substance and Sexual Activity   Alcohol use: No   Drug use: No   Sexual activity: Yes    Birth  control/protection: Surgical    Comment: mensus age 20, 1st pregnancy 76, no hrt gg4,p3, 1 babay lived a few hours complications  Other Topics Concern   Not on file  Social History Narrative   Not on file   Social Determinants of Health   Financial Resource Strain: Not on file  Food Insecurity: Not on file  Transportation Needs: Not on file  Physical Activity: Not on file  Stress: Not on file  Social Connections: Not on file  Intimate Partner Violence: Not on file    Family History:    Family History  Problem Relation Age of Onset   Prostate cancer Father    Breast cancer Other      ROS:  Please see the history of present illness.  All other ROS reviewed and negative.     Physical Exam/Data:   Vitals:   05/06/21 1957 05/06/21 2300 05/07/21 0502 05/07/21 1245  BP: (!) 162/63 (!) 150/53 (!) 167/64 136/82  Pulse: 90 85 77 75  Resp: _0 Temp: 98 F (36.7 C) 97.7 F (36.5 C) 98.7 F (37.1 C) 98.3 F (36.8 C)  TempSrc:  Oral Oral Oral  SpO2: 97% 97% 99% 99%  Weight:   61 kg   Height:        Intake/Output Summary (Last 24 hours) at 05/07/2021 1331 Last data filed at 05/07/2021 0900 Gross per 24 hour  Intake 370 ml  Output 1 ml  Net 369 ml   Last 3 Weights 05/07/2021 05/05/2021 11/19/2020  Weight (lbs) 134 lb 7.7 oz 133 lb 130 lb  Weight (kg) 61 kg 60.328 kg 58.968 kg     Body mass index is 25.41 kg/m.  General: Thin frail elderly female in no acute distress HEENT: normal Neck: no JVD Vascular: No carotid bruits; Distal pulses 2+ bilaterally Cardiac:  normal S1, S2; RRR; 3/6 murmur Lungs:  clear to auscultation bilaterally, no wheezing, rhonchi or rales  Abd: soft, nontender, no hepatomegaly  Ext: no edema Musculoskeletal:  No deformities, BUE and BLE strength normal and equal Skin: warm and dry  Neuro:  CNs 2-12 intact, no focal abnormalities noted Psych:  Normal affect   EKG:  The EKG was personally reviewed and demonstrates: Sinus rhythm, PAC,  LVH Telemetry:  Telemetry was personally reviewed and demonstrates:  N/A  Relevant CV Studies:  Stress test 11/2016 IMPRESSION: 1. Suspect inferior wall scar/infarction. No findings for ischemia.   2. Normal left ventricular wall motion except for the inferior wall.   3. Left ventricular ejection fraction 71%  4. Non invasive risk stratification*: Intermediate    Laboratory Data:  High Sensitivity Troponin:   Recent Labs  Lab 05/05/21 1532 05/05/21 1720 05/06/21 1113 05/06/21 1232  TROPONINIHS 54* 63* 31* 31*     Chemistry Recent Labs  Lab 05/05/21 1532 05/06/21 1113 05/07/21 0459  NA 137 137 138  K 4.3 3.8 3.6  CL 105 106 107  CO2 _0 GLUCOSE 153* 119* 103*  BUN 26* 26* 19  CREATININE 1.29* 0.86 0.83  CALCIUM 8.9 8.4* 8.4*  GFRNONAA 40* >60 >60  ANIONGAP _1 Recent Labs  Lab 05/05/21 1532 05/06/21 1113 05/07/21 0459  PROT 6.6 6.2* 6.1*  ALBUMIN 3.1* 2.8* 2.7*  AST 17 14* 13*  ALT _2 ALKPHOS 86 81 78  BILITOT 0.6 0.4 0.3   Lipids No results for input(s): CHOL, TRIG, HDL, LABVLDL, LDLCALC, CHOLHDL in the last 168 hours.  Hematology Recent Labs  Lab 05/05/21 1532 05/06/21 1113 05/07/21 0459  WBC 16.0* 11.5* 8.7  RBC 3.16* 2.96* 3.12*  HGB 9.6* 9.0* 9.3*  HCT 29.7* 28.3* 29.9*  MCV 94.0 95.6 95.8  MCH 30.4 30.4 29.8  MCHC 32.3 31.8 31.1  RDW 13.9 14.0 13.9  PLT 262 224 234   Thyroid No results for input(s): TSH, FREET4 in the last 168 hours.  BNP Recent Labs  Lab 05/06/21 1113  BNP 143.4*    DDimer No results for input(s): DDIMER in the last 168 hours.   Radiology/Studies:  DG Chest 2 View  Result Date: 05/05/2021 CLINICAL DATA:  Dyspnea.  Fall.  Cough. EXAM: CHEST - 2 VIEW COMPARISON:  Two-view chest x-ray 11/19/2020 FINDINGS: The heart is enlarged. Atherosclerotic changes are noted at the aortic arch. Changes of COPD are present. Medial right lower lobe airspace disease is present. The upper lung fields are  clear. Left shoulder arthroplasty present. Advanced degenerative changes again noted in the right shoulder. IMPRESSION: 1. Medial right lower lobe airspace disease concerning for pneumonia. 2. Stable cardiomegaly without failure. 3. Changes of COPD. Electronically Signed   By: San Morelle M.D.   On: 05/05/2021 16:06   CT Head Wo Contrast  Result Date: 05/05/2021 CLINICAL DATA:  Head trauma, mod-severe EXAM: CT HEAD WITHOUT CONTRAST TECHNIQUE: Contiguous axial images were obtained from the base of the skull through the vertex without intravenous contrast. COMPARISON:  11/19/2020 FINDINGS: Brain: There is no acute intracranial hemorrhage, mass effect, or edema. No new loss of gray-white differentiation. There is no extra-axial fluid collection. Prominence of the ventricles and sulci reflects stable parenchymal volume loss. Confluent areas of low-density in the supratentorial white matter are nonspecific but probably reflect stable moderate to marked chronic microvascular ischemic changes. Small chronic right occipital infarct. Vascular: There is atherosclerotic calcification at the skull base. Skull: Calvarium is unremarkable. Sinuses/Orbits: No acute finding. Other: None. IMPRESSION: No evidence of acute intracranial injury. Electronically Signed   By: Macy Mis M.D.   On: 05/05/2021 16:05   CT Cervical Spine Wo Contrast  Result Date: 05/05/2021 CLINICAL DATA:  Neck trauma (Age >= 65y) EXAM: CT CERVICAL SPINE WITHOUT CONTRAST TECHNIQUE: Multidetector CT imaging of the cervical spine was performed without intravenous contrast. Multiplanar CT image reconstructions were also generated. COMPARISON:  None. FINDINGS: Alignment: Mild multilevel degenerative listhesis. Skull base and vertebrae: Diffuse degenerative endplate irregularity. No acute fracture. Soft tissues and spinal canal: No prevertebral fluid or swelling. No visible canal hematoma. Disc levels: Multilevel degenerative changes are  present including  disc space narrowing, endplate osteophytes, and facet and uncovertebral hypertrophy. Upper chest: No apical lung mass. Other: Calcified plaque at the common carotid bifurcations. Retropharyngeal course carotids. IMPRESSION: No acute cervical spine fracture. Electronically Signed   By: Macy Mis M.D.   On: 05/05/2021 16:23   US RENAL  Result Date: 05/06/2021 CLINICAL DATA:  Acute kidney injury. EXAM: RENAL / URINARY TRACT ULTRASOUND COMPLETE COMPARISON:  Abdominal ultrasound 09/29/2006. FINDINGS: Right Kidney: Renal measurements: 9.2 x 5.2 x 6.3 cm = volume: 150 mL. There is no hydronephrosis. Mildly increased echogenicity. Renal cortical thinning. There is a 2.4 x 1.8 x 2.3 cm cyst in the mid right kidney. There is a hypoechoic area in the inferior pole the right kidney measuring 1.4 x 1.2 x 1.0 cm, also likely cyst, but not well characterized on this study. Cyst Left Kidney: Renal measurements: 10.1 x 5.8 x 6.0 cm = volume: 184 mL. There is no hydronephrosis. Mildly increased echogenicity. Renal cortical thinning. There are 2 hypoechoic areas in the lower pole the left kidney which are likely cysts, but not well characterized on this study these both measure up to 1.2 cm. Bladder: Appears normal for degree of bladder distention. Other: None. IMPRESSION: 1. Increased renal echogenicity with renal cortical thinning suggestive of medical renal disease. No hydronephrosis. 2. Right renal cyst measuring 22.4 cm. There are additional bilateral hypoechoic areas in both kidneys, likely small cysts, but well characterized on this study. Electronically Signed   By: Ronney Asters M.D.   On: 05/06/2021 19:24   DG Shoulder Left Port  Result Date: 05/07/2021 CLINICAL DATA:  Previous shoulder dislocation. Increased pain this morning. EXAM: LEFT SHOULDER COMPARISON:  10/05/2009 FINDINGS: Left total shoulder prosthesis is well seated without periprosthetic fracture or lucency. Moderate degenerative  changes of the acromioclavicular joint. Degenerative changes seen throughout the cervical spine. IMPRESSION: Uncomplicated left total shoulder prosthesis. Electronically Signed   By: Miachel Roux M.D.   On: 05/07/2021 08:48   ECHOCARDIOGRAM COMPLETE  Result Date: 05/06/2021    ECHOCARDIOGRAM REPORT   Patient Name:   Brandy [name] [facility] Date of Exam: 05/06/2021 Medical Rec #:  [id number]       Height:       61.0 in Accession #:    [id number]      Weight:       133.0 lb Date of Birth:  10-03-1932        BSA:          1.588 m Patient Age:    9 years        BP:           143/61 mmHg Patient Gender: F               HR:           87 bpm. Exam Location:  Inpatient Procedure: 2D Echo, Cardiac Doppler and Color Doppler Indications:    Syncope R55  History:        Patient has prior history of Echocardiogram examinations, most                 recent 09/08/2019. Aortic Valve Disease and Mitral Valve Disease;                 Risk Factors:Hypertension and Dyslipidemia. GERD. Past history                 of breast cancer.  Sonographer:    Darlina Sicilian RDCS Referring Phys: [id number] Jonnie Finner  Sonographer Comments: PEDOF attempted. IMPRESSIONS  1. Left ventricular ejection fraction, by estimation, is 70 to 75%. The left ventricle has hyperdynamic function. The left ventricle has no regional wall motion abnormalities. There is mild left ventricular hypertrophy. Left ventricular diastolic parameters are consistent with Grade I diastolic dysfunction (impaired relaxation). Elevated left atrial pressure.  2. Right ventricular systolic function is normal. The right ventricular size is normal.  3. Mild mitral valve regurgitation. Moderate to severe mitral annular calcification.  4. AV is thickened, calcified with restricted motion. Peak and mean gradients through the valve are 64 and 38 mm Hg respectively. Dimensionless index is 0.27 Consistent with moderate to severe AS> Compared to echo report from 2021, mean gradient is increased (52m  to 38 mm ) . The aortic valve is abnormal. Aortic valve regurgitation is not visualized.  5. The inferior vena cava is normal in size with greater than 50% respiratory variability, suggesting right atrial pressure of 3 mmHg. FINDINGS  Left Ventricle: Left ventricular ejection fraction, by estimation, is 70 to 75%. The left ventricle has hyperdynamic function. The left ventricle has no regional wall motion abnormalities. The left ventricular internal cavity size was normal in size. There is mild left ventricular hypertrophy. Left ventricular diastolic parameters are consistent with Grade I diastolic dysfunction (impaired relaxation). Elevated left atrial pressure. Right Ventricle: The right ventricular size is normal. Right vetricular wall thickness was not assessed. Right ventricular systolic function is normal. Left Atrium: Left atrial size was normal in size. Right Atrium: Right atrial size was normal in size. Pericardium: There is no evidence of pericardial effusion. Mitral Valve: There is moderate thickening of the mitral valve leaflet(s). Moderate to severe mitral annular calcification. Mild mitral valve regurgitation. MV peak gradient, 10.7 mmHg. The mean mitral valve gradient is 4.0 mmHg. Tricuspid Valve: The tricuspid valve is normal in structure. Tricuspid valve regurgitation is trivial. Aortic Valve: AV is thickened, calcified with restricted motion. Peak and mean gradients through the valve are 64 and 38 mm Hg respectively. Dimensionless index is 0.27 Consistent with moderate to severe AS> Compared to echo report from 2021, mean gradient is increased (236mto 38 mm ). The aortic valve is abnormal. Aortic valve regurgitation is not visualized. Aortic valve mean gradient measures 34.5 mmHg. Aortic valve peak gradient measures 56.4 mmHg. Aortic valve area, by VTI measures 0.76 cm. Pulmonic Valve: The pulmonic valve was not well visualized. Pulmonic valve regurgitation is not visualized. Aorta: The aortic  root and ascending aorta are structurally normal, with no evidence of dilitation. Venous: The inferior vena cava is normal in size with greater than 50% respiratory variability, suggesting right atrial pressure of 3 mmHg. IAS/Shunts: No atrial level shunt detected by color flow Doppler.  LEFT VENTRICLE PLAX 2D LVIDd:         4.10 cm   Diastology LVIDs:         2.70 cm   LV e' medial:    3.37 cm/s LV PW:         1.20 cm   LV E/e' medial:  31.2 LV IVS:        1.20 cm   LV e' lateral:   5.55 cm/s LVOT diam:     1.90 cm   LV E/e' lateral: 18.9 LV SV:         61 LV SV Index:   38 LVOT Area:     2.84 cm  RIGHT VENTRICLE RV S prime:     12.40 cm/s TAPSE (M-mode): 1.6 cm  LEFT ATRIUM             Index        RIGHT ATRIUM           Index LA diam:        3.20 cm 2.02 cm/m   RA Area:     12.90 cm LA Vol (A2C):   42.5 ml 26.76 ml/m  RA Volume:   24.50 ml  15.43 ml/m LA Vol (A4C):   28.6 ml 18.01 ml/m LA Biplane Vol: 34.9 ml 21.98 ml/m  AORTIC VALVE AV Area (Vmax):    0.88 cm AV Area (Vmean):   0.78 cm AV Area (VTI):     0.76 cm AV Vmax:           375.50 cm/s AV Vmean:          279.750 cm/s AV VTI:            0.798 m AV Peak Grad:      56.4 mmHg AV Mean Grad:      34.5 mmHg LVOT Vmax:         116.00 cm/s LVOT Vmean:        76.700 cm/s LVOT VTI:          0.214 m LVOT/AV VTI ratio: 0.27  AORTA Ao Root diam: 3.60 cm MITRAL VALVE MV Area (PHT): 1.68 cm     SHUNTS MV Area VTI:   1.56 cm     Systemic VTI:  0.21 m MV Peak grad:  10.7 mmHg    Systemic Diam: 1.90 cm MV Mean grad:  4.0 mmHg MV Vmax:       1.64 m/s MV Vmean:      91.7 cm/s MV Decel Time: 451 msec MV E velocity: 105.00 cm/s MV A velocity: 170.00 cm/s MV E/A ratio:  0.62 Dorris Carnes MD Electronically signed by Dorris Carnes MD Signature Date/Time: 05/06/2021/5:32:02 PM    Final      Assessment and Plan:   Fall/syncope Unclear story.  Patient is a poor historian.  No family at bedside.  Patient uses walker for ambulation at home.  Has unsteady gait.  2.  Aortic  stenosis -Patient with longstanding history of moderate aortic stenosis.  Echocardiogram this admission showed hyperdynamic LV function at 70 to 75% and grade 1 diastolic dysfunction.  Aortic Vale with restricted motion.  Mean gradient increased to 38 mmHg from 20 mmHg in 2021. Aortic valve peak gradient measures 56.4 mmHg. Aortic  valve area, by VTI measures 0.76 cm.  -Her worsening dyspnea and possible syncope could be due to valvular etiology -She will need L & RHC if plan for further evaluation  3.  Acute hypoxic respiratory failure secondary to pneumonia -Breathing has improved -Per primary team  4. HTN - BP intermittently elevated - Continue HCTZ/AVAPRO  5. HLD - Continue statin   Dr. Gardiner Rhyme to see.   For questions or updates, please contact [place] Please consult [email or web address] for contact info under    Jarrett Soho, PA  05/07/2021 1:31 PM   Patient seen and examined.  Agree with above documentation.  Ms. Demby is a 19-year female with history of aortic stenosis, breast cancer, hypertension, hyperlipidemia who we are consulted by Dr. Lonny Prude for evaluation of syncope and aortic stenosis.  She reports that after using toilet, she stood up and fell forward.  Hit her head on the ground.  She is unable to say whether she lost consciousness or not.  She does report  she has been getting short of breath with minimal exertion.  Denies any chest pain.  SPO2 89% on arrival.  She was started on antibiotics.  Suspected right lower lobe pneumonia.  Echocardiogram 05/06/2021 showed EF 70 to 73%, grade 1 diastolic dysfunction, normal RV function, moderate to severe MAC, mild MR, moderate to severe aortic stenosis (mean gradient 38 mmHg, dimensionless index 0.27).  EKG shows sinus rhythm with PACs, rate 82 EKG shows sinus rhythm with PACs, LVH with repolarization abnormalities.  On exam, patient is alert and oriented, regular rate and rhythm, 3 out of 6 systolic murmur, lungs  CTAB, no LE edema or JVD.  It is unclear whether she had true syncope.  However she is having exertional dyspnea and suspect it is due to her aortic stenosis.  Recommend RHC/LHC for further evaluation and will plan follow-up in valve clinic.  Plan cath for tomorrow.  Risks and benefits of cardiac catheterization have been discussed with the patient.  These include bleeding, infection, kidney damage, stroke, heart attack, death.  The patient understands these risks and is willing to proceed.  I also spoke with patient's daughter who is in agreement with proceeding with cath.  Donato Heinz, MD

## 2021-05-07 NOTE — Consult Note (Addendum)
Cardiology Consultation:   Patient ID: Brandy [name] MRN: [id number]; DOB: Nov 21, 1932  Admit date: 05/05/2021 Date of Consult: 05/07/2021  PCP:  Kathyrn Lass, MD   [place] Providers Cardiologist:  None Dr. Meda Coffee >>> Dr. Gardiner Rhyme    Patient Profile:   Brandy [name] is a 87 y.o. female with a hx of  moderate aortic stenosis with mild AI, HTN, HLD invasive ductal carcinoma of her left breast 2013 who is being seen 05/07/2021 for the evaluation of syncope and AS at the request of Dr. Lonny Prude.  Patient was hospitalized in May 2018 with incarcerated incisional hernia status post resection and repair. She had a Lexi scan Myoview 11/17/16 preop that was normal without ischemia, LVEF 71%.   Last seen by Dr. Meda Coffee 08/2019.  Last echo 09/2019 with LVEF of 70-75%, grade 1 DD. Peak and mean  gradients through the value are 35 and 20 mm Hg.   History of Present Illness:   [name] brought in by daughter for evaluation of fall and shortness of breath 10/31.  After using commode, she stood up and fall forward.  Hit her head to ground.  Daughter got concerned and brought her here.  Patient is a very poor historian.  She uses walker for ambulation at home.  Patient with recent multiple history of falls.  Unsure if patient has lost consciousness or not.  No family at bedside currently.  She also reports progressive worsening shortness of breath with minimal ambulation.  No exertional chest tightness or pressure.  Denies dizziness, lower extremity edema, melena or blood in her stool or urine.  Upon arrival she was found hypoxic at oxygen saturation of 89% on room air.  Chest x-ray showed right lower lobe pneumonia and started on Rocephin and azithromycin.  Patient also complains of shoulder pain.  Given abnormal echocardiogram showing worsening aortic stenosis cardiology is asked for further evaluation.  Echo 05/06/2021 1. Left ventricular ejection fraction, by estimation, is 70 to 75%. The  left  ventricle has hyperdynamic function. The left ventricle has no  regional wall motion abnormalities. There is mild left ventricular  hypertrophy. Left ventricular diastolic  parameters are consistent with Grade I diastolic dysfunction (impaired  relaxation). Elevated left atrial pressure.   2. Right ventricular systolic function is normal. The right ventricular  size is normal.   3. Mild mitral valve regurgitation. Moderate to severe mitral annular  calcification.   4. AV is thickened, calcified with restricted motion. Peak and mean  gradients through the valve are 64 and 38 mm Hg respectively.  Dimensionless index is 0.27 Consistent with moderate to severe AS>  Compared to echo report from 2021, mean gradient is  increased (76m to 38 mm ) . The aortic valve is abnormal. Aortic valve  regurgitation is not visualized.   5. The inferior vena cava is normal in size with greater than 50%  respiratory variability, suggesting right atrial pressure of 3 mmHg.    Past Medical History:  Diagnosis Date   Anemia    Arthritis    shoulders   Asthma    mild, no inhalers used   Breast cancer ([facility] 12/18/11   left breast masectomy=metastatic ca in (1/1) lymph node ,invasive ductal ca,2 foci,,dcis,lymph ovascular invasion identified,surgical resection margins neg for ca,additional tissue=benign skin and subcutaneous tissue   Bronchitis    hx of;last time >186yrgo   Depression    takes Celexa daily   Difficult intubation    Dysphagia    Full  dentures    Gall stones 2015   GERD (gastroesophageal reflux disease)    takes Prilosec prn   H/O hiatal hernia    History of kidney stones    many yrs ago   Hx of radiation therapy 04/26/12 -06/10/12   left breast   Hyperlipidemia    takes Simvastatin daily   Hypertension    takes Amlodipine and Diovan daily   Insomnia    takes Ambien nightly   Urinary urgency     Past Surgical History:  Procedure Laterality Date   APPENDECTOMY     bladder  tack     BREAST BIOPSY  1998   left    DILATION AND CURETTAGE OF UTERUS     ENDOSCOPIC RETROGRADE CHOLANGIOPANCREATOGRAPHY (ERCP) WITH PROPOFOL N/A 02/01/2014   Procedure: ENDOSCOPIC RETROGRADE CHOLANGIOPANCREATOGRAPHY (ERCP) WITH PROPOFOL;  Surgeon: [name], MD;  Location: [facility];  Service: Endoscopy;  Laterality: N/A;   ESOPHAGOGASTRODUODENOSCOPY     ESOPHAGOGASTRODUODENOSCOPY N/A 06/26/2016   Procedure: ESOPHAGOGASTRODUODENOSCOPY (EGD);  Surgeon: [name], MD;  Location: [facility];  Service: Endoscopy;  Laterality: N/A;   ESOPHAGOGASTRODUODENOSCOPY (EGD) WITH PROPOFOL  12/19/2013   Procedure: ESOPHAGOGASTRODUODENOSCOPY (EGD) WITH PROPOFOL;  Surgeon: Beryle Beams, MD;  Location: [facility];  Service: Endoscopy;;   ESOPHAGOGASTRODUODENOSCOPY (EGD) WITH PROPOFOL N/A 09/10/2016   Procedure: ESOPHAGOGASTRODUODENOSCOPY (EGD) WITH PROPOFOL;  Surgeon: [name], MD;  [facility]: [facility];  Service: Endoscopy;  Laterality: N/A;   EUS  06/04/2011   Procedure: UPPER ENDOSCOPIC ULTRASOUND (EUS) LINEAR;  Surgeon: Owens Loffler, MD;  Location: [facility];  Service: Endoscopy;  Laterality: N/A;   EUS N/A 02/01/2014   Procedure: UPPER ENDOSCOPIC ULTRASOUND (EUS) LINEAR;  Surgeon: [name], MD;  Location: [facility];  Service: Endoscopy;  Laterality: N/A;   EXPLORATORY LAPAROTOMY      biopsy of intra-abdominal mass   INTRAMEDULLARY (IM) NAIL INTERTROCHANTERIC Right 10/24/2018   Procedure: INTRAMEDULLARY (IM) NAIL INTERTROCHANTRIC;  Surgeon: Gaynelle Arabian, MD;  Location: [facility];  Service: Orthopedics;  Laterality: Right;   LAPAROTOMY N/A 11/17/2016   Procedure: EXPLORATORY LAPAROTOMY WITH LYSIS OF ADHESIONS, SMALL BOWEL RESECTION, REPAIR OF INCARCERATED VENTRAL INCISIONAL HERNIA, INSERTION OF BIOLOGIC MESH PATCH,APPLICATION OF WOUND VAC DRESSING;  Surgeon: Armandina Gemma, MD;  Location: [facility];  Service: General;  Laterality: N/A;   MASTECTOMY W/ SENTINEL NODE BIOPSY   12/18/2011   Procedure: MASTECTOMY WITH SENTINEL LYMPH NODE BIOPSY;  Surgeon: Rolm Bookbinder, MD;  Location: [facility];  Service: General;  Laterality: Left;   PORT-A-CATH REMOVAL Right 06/15/2013   Procedure: REMOVAL PORT-A-CATH;  Surgeon: Rolm Bookbinder, MD;  Location: [facility];  Service: General;  Laterality: Right;   PORTACATH PLACEMENT  01/27/2012   Procedure: INSERTION PORT-A-CATH;  Surgeon: Rolm Bookbinder, MD;  Location: [facility];  Service: General;  Laterality: Right;  PORT PLACEMENT   TOTAL HIP ARTHROPLASTY Right 11/14/2018   Procedure: REMOVAL OF INTRAMEDULLARY NAIL AND POSTERIOR TOTAL HIP ARTHROPLASTY;  Surgeon: Gaynelle Arabian, MD;  Location: [facility];  Service: Orthopedics;  Laterality: Right;   TOTAL SHOULDER REPLACEMENT  2011   left   TUBAL LIGATION        Inpatient Medications: Scheduled Meds:  atorvastatin  40 mg Oral Daily   azithromycin  500 mg Oral Daily   DULoxetine  20 mg Oral BID   enoxaparin (LOVENOX) injection  40 mg Subcutaneous Q24H   gabapentin  300 mg Oral QHS   guaiFENesin  600 mg Oral BID   irbesartan  75 mg Oral Daily   And   hydrochlorothiazide  12.5 mg Oral Daily   sodium chloride flush  3 mL Intravenous Q12H   Continuous Infusions:  cefTRIAXone (ROCEPHIN)  IV Stopped (05/06/21 2054)   PRN Meds: acetaminophen **OR** acetaminophen, ipratropium-albuterol, ondansetron **OR** ondansetron (ZOFRAN) IV, traMADol  Allergies:   No Known Allergies  Social History:   Social History   Socioeconomic History   Marital status: Married    Spouse name: Not on file   Number of children: Not on file   Years of education: Not on file   Highest education level: Not on file  Occupational History   Occupation: retired  Tobacco Use   Smoking status: Never   Smokeless tobacco: Never  Vaping Use   Vaping Use: Never used  Substance and Sexual Activity   Alcohol use: No   Drug use: No   Sexual activity: Yes    Birth  control/protection: Surgical    Comment: mensus age 60, 1st pregnancy 57, no hrt gg4,p3, 1 babay lived a few hours complications  Other Topics Concern   Not on file  Social History Narrative   Not on file   Social Determinants of Health   Financial Resource Strain: Not on file  Food Insecurity: Not on file  Transportation Needs: Not on file  Physical Activity: Not on file  Stress: Not on file  Social Connections: Not on file  Intimate Partner Violence: Not on file    Family History:    Family History  Problem Relation Age of Onset   Prostate cancer Father    Breast cancer Other      ROS:  Please see the history of present illness.  All other ROS reviewed and negative.     Physical Exam/Data:   Vitals:   05/06/21 1957 05/06/21 2300 05/07/21 0502 05/07/21 1245  BP: (!) 162/63 (!) 150/53 (!) 167/64 136/82  Pulse: 90 85 77 75  Resp: _0 Temp: 98 F (36.7 C) 97.7 F (36.5 C) 98.7 F (37.1 C) 98.3 F (36.8 C)  TempSrc:  Oral Oral Oral  SpO2: 97% 97% 99% 99%  Weight:   61 kg   Height:        Intake/Output Summary (Last 24 hours) at 05/07/2021 1331 Last data filed at 05/07/2021 0900 Gross per 24 hour  Intake 370 ml  Output 1 ml  Net 369 ml   Last 3 Weights 05/07/2021 05/05/2021 11/19/2020  Weight (lbs) 134 lb 7.7 oz 133 lb 130 lb  Weight (kg) 61 kg 60.328 kg 58.968 kg     Body mass index is 25.41 kg/m.  General: Thin frail elderly female in no acute distress HEENT: normal Neck: no JVD Vascular: No carotid bruits; Distal pulses 2+ bilaterally Cardiac:  normal S1, S2; RRR; 3/6 murmur Lungs:  clear to auscultation bilaterally, no wheezing, rhonchi or rales  Abd: soft, nontender, no hepatomegaly  Ext: no edema Musculoskeletal:  No deformities, BUE and BLE strength normal and equal Skin: warm and dry  Neuro:  CNs 2-12 intact, no focal abnormalities noted Psych:  Normal affect   EKG:  The EKG was personally reviewed and demonstrates: Sinus rhythm, PAC,  LVH Telemetry:  Telemetry was personally reviewed and demonstrates:  N/A  Relevant CV Studies:  Stress test 11/2016 IMPRESSION: 1. Suspect inferior wall scar/infarction. No findings for ischemia.   2. Normal left ventricular wall motion except for the inferior wall.   3. Left ventricular ejection fraction 71%  4. Non invasive risk stratification*: Intermediate    Laboratory Data:  High Sensitivity Troponin:   Recent Labs  Lab 05/05/21 1532 05/05/21 1720 05/06/21 1113 05/06/21 1232  TROPONINIHS 54* 63* 31* 31*     Chemistry Recent Labs  Lab 05/05/21 1532 05/06/21 1113 05/07/21 0459  NA 137 137 138  K 4.3 3.8 3.6  CL 105 106 107  CO2 _0 GLUCOSE 153* 119* 103*  BUN 26* 26* 19  CREATININE 1.29* 0.86 0.83  CALCIUM 8.9 8.4* 8.4*  GFRNONAA 40* >60 >60  ANIONGAP _1 Recent Labs  Lab 05/05/21 1532 05/06/21 1113 05/07/21 0459  PROT 6.6 6.2* 6.1*  ALBUMIN 3.1* 2.8* 2.7*  AST 17 14* 13*  ALT _2 ALKPHOS 86 81 78  BILITOT 0.6 0.4 0.3   Lipids No results for input(s): CHOL, TRIG, HDL, LABVLDL, LDLCALC, CHOLHDL in the last 168 hours.  Hematology Recent Labs  Lab 05/05/21 1532 05/06/21 1113 05/07/21 0459  WBC 16.0* 11.5* 8.7  RBC 3.16* 2.96* 3.12*  HGB 9.6* 9.0* 9.3*  HCT 29.7* 28.3* 29.9*  MCV 94.0 95.6 95.8  MCH 30.4 30.4 29.8  MCHC 32.3 31.8 31.1  RDW 13.9 14.0 13.9  PLT 262 224 234   Thyroid No results for input(s): TSH, FREET4 in the last 168 hours.  BNP Recent Labs  Lab 05/06/21 1113  BNP 143.4*    DDimer No results for input(s): DDIMER in the last 168 hours.   Radiology/Studies:  DG Chest 2 View  Result Date: 05/05/2021 CLINICAL DATA:  Dyspnea.  Fall.  Cough. EXAM: CHEST - 2 VIEW COMPARISON:  Two-view chest x-ray 11/19/2020 FINDINGS: The heart is enlarged. Atherosclerotic changes are noted at the aortic arch. Changes of COPD are present. Medial right lower lobe airspace disease is present. The upper lung fields are  clear. Left shoulder arthroplasty present. Advanced degenerative changes again noted in the right shoulder. IMPRESSION: 1. Medial right lower lobe airspace disease concerning for pneumonia. 2. Stable cardiomegaly without failure. 3. Changes of COPD. Electronically Signed   By: San Morelle M.D.   On: 05/05/2021 16:06   CT Head Wo Contrast  Result Date: 05/05/2021 CLINICAL DATA:  Head trauma, mod-severe EXAM: CT HEAD WITHOUT CONTRAST TECHNIQUE: Contiguous axial images were obtained from the base of the skull through the vertex without intravenous contrast. COMPARISON:  11/19/2020 FINDINGS: Brain: There is no acute intracranial hemorrhage, mass effect, or edema. No new loss of gray-white differentiation. There is no extra-axial fluid collection. Prominence of the ventricles and sulci reflects stable parenchymal volume loss. Confluent areas of low-density in the supratentorial white matter are nonspecific but probably reflect stable moderate to marked chronic microvascular ischemic changes. Small chronic right occipital infarct. Vascular: There is atherosclerotic calcification at the skull base. Skull: Calvarium is unremarkable. Sinuses/Orbits: No acute finding. Other: None. IMPRESSION: No evidence of acute intracranial injury. Electronically Signed   By: Macy Mis M.D.   On: 05/05/2021 16:05   CT Cervical Spine Wo Contrast  Result Date: 05/05/2021 CLINICAL DATA:  Neck trauma (Age >= 65y) EXAM: CT CERVICAL SPINE WITHOUT CONTRAST TECHNIQUE: Multidetector CT imaging of the cervical spine was performed without intravenous contrast. Multiplanar CT image reconstructions were also generated. COMPARISON:  None. FINDINGS: Alignment: Mild multilevel degenerative listhesis. Skull base and vertebrae: Diffuse degenerative endplate irregularity. No acute fracture. Soft tissues and spinal canal: No prevertebral fluid or swelling. No visible canal hematoma. Disc levels: Multilevel degenerative changes are  present including  disc space narrowing, endplate osteophytes, and facet and uncovertebral hypertrophy. Upper chest: No apical lung mass. Other: Calcified plaque at the common carotid bifurcations. Retropharyngeal course carotids. IMPRESSION: No acute cervical spine fracture. Electronically Signed   By: Macy Mis M.D.   On: 05/05/2021 16:23   US RENAL  Result Date: 05/06/2021 CLINICAL DATA:  Acute kidney injury. EXAM: RENAL / URINARY TRACT ULTRASOUND COMPLETE COMPARISON:  Abdominal ultrasound 09/29/2006. FINDINGS: Right Kidney: Renal measurements: 9.2 x 5.2 x 6.3 cm = volume: 150 mL. There is no hydronephrosis. Mildly increased echogenicity. Renal cortical thinning. There is a 2.4 x 1.8 x 2.3 cm cyst in the mid right kidney. There is a hypoechoic area in the inferior pole the right kidney measuring 1.4 x 1.2 x 1.0 cm, also likely cyst, but not well characterized on this study. Cyst Left Kidney: Renal measurements: 10.1 x 5.8 x 6.0 cm = volume: 184 mL. There is no hydronephrosis. Mildly increased echogenicity. Renal cortical thinning. There are 2 hypoechoic areas in the lower pole the left kidney which are likely cysts, but not well characterized on this study these both measure up to 1.2 cm. Bladder: Appears normal for degree of bladder distention. Other: None. IMPRESSION: 1. Increased renal echogenicity with renal cortical thinning suggestive of medical renal disease. No hydronephrosis. 2. Right renal cyst measuring 22.4 cm. There are additional bilateral hypoechoic areas in both kidneys, likely small cysts, but well characterized on this study. Electronically Signed   By: Ronney Asters M.D.   On: 05/06/2021 19:24   DG Shoulder Left Port  Result Date: 05/07/2021 CLINICAL DATA:  Previous shoulder dislocation. Increased pain this morning. EXAM: LEFT SHOULDER COMPARISON:  10/05/2009 FINDINGS: Left total shoulder prosthesis is well seated without periprosthetic fracture or lucency. Moderate degenerative  changes of the acromioclavicular joint. Degenerative changes seen throughout the cervical spine. IMPRESSION: Uncomplicated left total shoulder prosthesis. Electronically Signed   By: Miachel Roux M.D.   On: 05/07/2021 08:48   ECHOCARDIOGRAM COMPLETE  Result Date: 05/06/2021    ECHOCARDIOGRAM REPORT   Patient Name:   Brandy [name] [facility] Date of Exam: 05/06/2021 Medical Rec #:  [id number]       Height:       61.0 in Accession #:    [id number]      Weight:       133.0 lb Date of Birth:  [birth year]        BSA:          1.588 m Patient Age:    41 years        BP:           143/61 mmHg Patient Gender: F               HR:           87 bpm. Exam Location:  Inpatient Procedure: 2D Echo, Cardiac Doppler and Color Doppler Indications:    Syncope R55  History:        Patient has prior history of Echocardiogram examinations, most                 recent 09/08/2019. Aortic Valve Disease and Mitral Valve Disease;                 Risk Factors:Hypertension and Dyslipidemia. GERD. Past history                 of breast cancer.  Sonographer:    Darlina Sicilian RDCS Referring Phys: [id number] Jonnie Finner  Sonographer Comments: PEDOF attempted. IMPRESSIONS  1. Left ventricular ejection fraction, by estimation, is 70 to 75%. The left ventricle has hyperdynamic function. The left ventricle has no regional wall motion abnormalities. There is mild left ventricular hypertrophy. Left ventricular diastolic parameters are consistent with Grade I diastolic dysfunction (impaired relaxation). Elevated left atrial pressure.  2. Right ventricular systolic function is normal. The right ventricular size is normal.  3. Mild mitral valve regurgitation. Moderate to severe mitral annular calcification.  4. AV is thickened, calcified with restricted motion. Peak and mean gradients through the valve are 64 and 38 mm Hg respectively. Dimensionless index is 0.27 Consistent with moderate to severe AS> Compared to echo report from 2021, mean gradient is increased (52m  to 38 mm ) . The aortic valve is abnormal. Aortic valve regurgitation is not visualized.  5. The inferior vena cava is normal in size with greater than 50% respiratory variability, suggesting right atrial pressure of 3 mmHg. FINDINGS  Left Ventricle: Left ventricular ejection fraction, by estimation, is 70 to 75%. The left ventricle has hyperdynamic function. The left ventricle has no regional wall motion abnormalities. The left ventricular internal cavity size was normal in size. There is mild left ventricular hypertrophy. Left ventricular diastolic parameters are consistent with Grade I diastolic dysfunction (impaired relaxation). Elevated left atrial pressure. Right Ventricle: The right ventricular size is normal. Right vetricular wall thickness was not assessed. Right ventricular systolic function is normal. Left Atrium: Left atrial size was normal in size. Right Atrium: Right atrial size was normal in size. Pericardium: There is no evidence of pericardial effusion. Mitral Valve: There is moderate thickening of the mitral valve leaflet(s). Moderate to severe mitral annular calcification. Mild mitral valve regurgitation. MV peak gradient, 10.7 mmHg. The mean mitral valve gradient is 4.0 mmHg. Tricuspid Valve: The tricuspid valve is normal in structure. Tricuspid valve regurgitation is trivial. Aortic Valve: AV is thickened, calcified with restricted motion. Peak and mean gradients through the valve are 64 and 38 mm Hg respectively. Dimensionless index is 0.27 Consistent with moderate to severe AS> Compared to echo report from 2021, mean gradient is increased (236mto 38 mm ). The aortic valve is abnormal. Aortic valve regurgitation is not visualized. Aortic valve mean gradient measures 34.5 mmHg. Aortic valve peak gradient measures 56.4 mmHg. Aortic valve area, by VTI measures 0.76 cm. Pulmonic Valve: The pulmonic valve was not well visualized. Pulmonic valve regurgitation is not visualized. Aorta: The aortic  root and ascending aorta are structurally normal, with no evidence of dilitation. Venous: The inferior vena cava is normal in size with greater than 50% respiratory variability, suggesting right atrial pressure of 3 mmHg. IAS/Shunts: No atrial level shunt detected by color flow Doppler.  LEFT VENTRICLE PLAX 2D LVIDd:         4.10 cm   Diastology LVIDs:         2.70 cm   LV e' medial:    3.37 cm/s LV PW:         1.20 cm   LV E/e' medial:  31.2 LV IVS:        1.20 cm   LV e' lateral:   5.55 cm/s LVOT diam:     1.90 cm   LV E/e' lateral: 18.9 LV SV:         61 LV SV Index:   38 LVOT Area:     2.84 cm  RIGHT VENTRICLE RV S prime:     12.40 cm/s TAPSE (M-mode): 1.6 cm  LEFT ATRIUM             Index        RIGHT ATRIUM           Index LA diam:        3.20 cm 2.02 cm/m   RA Area:     12.90 cm LA Vol (A2C):   42.5 ml 26.76 ml/m  RA Volume:   24.50 ml  15.43 ml/m LA Vol (A4C):   28.6 ml 18.01 ml/m LA Biplane Vol: 34.9 ml 21.98 ml/m  AORTIC VALVE AV Area (Vmax):    0.88 cm AV Area (Vmean):   0.78 cm AV Area (VTI):     0.76 cm AV Vmax:           375.50 cm/s AV Vmean:          279.750 cm/s AV VTI:            0.798 m AV Peak Grad:      56.4 mmHg AV Mean Grad:      34.5 mmHg LVOT Vmax:         116.00 cm/s LVOT Vmean:        76.700 cm/s LVOT VTI:          0.214 m LVOT/AV VTI ratio: 0.27  AORTA Ao Root diam: 3.60 cm MITRAL VALVE MV Area (PHT): 1.68 cm     SHUNTS MV Area VTI:   1.56 cm     Systemic VTI:  0.21 m MV Peak grad:  10.7 mmHg    Systemic Diam: 1.90 cm MV Mean grad:  4.0 mmHg MV Vmax:       1.64 m/s MV Vmean:      91.7 cm/s MV Decel Time: 451 msec MV E velocity: 105.00 cm/s MV A velocity: 170.00 cm/s MV E/A ratio:  0.62 Dorris Carnes MD Electronically signed by Dorris Carnes MD Signature Date/Time: 05/06/2021/5:32:02 PM    Final      Assessment and Plan:   Fall/syncope Unclear story.  Patient is a poor historian.  No family at bedside.  Patient uses walker for ambulation at home.  Has unsteady gait.  2.  Aortic  stenosis -Patient with longstanding history of moderate aortic stenosis.  Echocardiogram this admission showed hyperdynamic LV function at 70 to 75% and grade 1 diastolic dysfunction.  Aortic Vale with restricted motion.  Mean gradient increased to 38 mmHg from 20 mmHg in 2021. Aortic valve peak gradient measures 56.4 mmHg. Aortic  valve area, by VTI measures 0.76 cm.  -Her worsening dyspnea and possible syncope could be due to valvular etiology -She will need L & RHC if plan for further evaluation  3.  Acute hypoxic respiratory failure secondary to pneumonia -Breathing has improved -Per primary team  4. HTN - BP intermittently elevated - Continue HCTZ/AVAPRO  5. HLD - Continue statin   Dr. Gardiner Rhyme to see.   For questions or updates, please contact [place] Please consult [email or web address] for contact info under    Jarrett Soho, PA  05/07/2021 1:31 PM   Patient seen and examined.  Agree with above documentation.  Ms. Demby is a 19-year female with history of aortic stenosis, breast cancer, hypertension, hyperlipidemia who we are consulted by Dr. Lonny Prude for evaluation of syncope and aortic stenosis.  She reports that after using toilet, she stood up and fell forward.  Hit her head on the ground.  She is unable to say whether she lost consciousness or not.  She does report  she has been getting short of breath with minimal exertion.  Denies any chest pain.  SPO2 89% on arrival.  She was started on antibiotics.  Suspected right lower lobe pneumonia.  Echocardiogram 05/06/2021 showed EF 70 to 62%, grade 1 diastolic dysfunction, normal RV function, moderate to severe MAC, mild MR, moderate to severe aortic stenosis (mean gradient 38 mmHg, dimensionless index 0.27).  EKG shows sinus rhythm with PACs, rate 82 EKG shows sinus rhythm with PACs, LVH with repolarization abnormalities.  On exam, patient is alert and oriented, regular rate and rhythm, 3 out of 6 systolic murmur, lungs  CTAB, no LE edema or JVD.  It is unclear whether she had true syncope.  However she is having exertional dyspnea and suspect it is due to her aortic stenosis.  Recommend RHC/LHC for further evaluation and will plan follow-up in valve clinic.  Plan cath for tomorrow.  Risks and benefits of cardiac catheterization have been discussed with the patient.  These include bleeding, infection, kidney damage, stroke, heart attack, death.  The patient understands these risks and is willing to proceed.  I also spoke with patient's daughter who is in agreement with proceeding with cath.  Donato Heinz, MD

## 2021-05-07 NOTE — Plan of Care (Signed)
  Problem: Activity: Goal: Risk for activity intolerance will decrease Outcome: Progressing   Problem: Nutrition: Goal: Adequate nutrition will be maintained Outcome: Progressing   Problem: Coping: Goal: Level of anxiety will decrease Outcome: Progressing   Problem: Pain Managment: Goal: General experience of comfort will improve Outcome: Progressing   Problem: Safety: Goal: Ability to remain free from injury will improve Outcome: Progressing   

## 2021-05-07 NOTE — Progress Notes (Signed)
PROGRESS NOTE    Brandy [name]  WYO:[id number] DOB: [birth year] DOA: 05/05/2021 PCP: Kathyrn Lass, MD   Brief Narrative: Brandy [name] is a 87 y.o. female with a history of dementia, hyperlipidemia, hypertension. Patient presented secondary to a fall with possible syncopal episode. On admission, patient was found to have evidence of a RLL pneumonia. Empiric antibiotics initiated.   Assessment & Plan:   Active Problems:   Aortic valve stenosis   Acute on chronic respiratory failure with hypoxemia (HCC)   PNA (pneumonia)   Syncope and collapse   RLL pneumonia Chest x-ray significant for right lower lobe airspace disease concerning for pneumonia. Ceftriaxone and azithromycin empirically started on admission. No blood cultures obtained on admission. Patient with leukocytosis that has improved. -Continue Ceftriaxone IV and Azithromycin IV  Acute respiratory failure with hypoxia Secondary to pneumonia. -Wean to room air as able -Ambulatory pulse ox -PT/OT  Fall Possible syncope Possible this could be related to vasovagal response but more concerning, could be related to critical aortic stenosis. -Orthostatic vitals -Cardiology consulted  AKI Baseline creatinine of 0.8. Creatinine of 1.29 on admission. Resolved with IV fluids.  Aortic stenosis Previous Transthoracic Echocardiogram significant for moderate stenosis. Now with moderate-severe stenosis.  Scalp laceration Two staples placed in the ED. Staples will need to be removed as an outpatient.  Hyperlipidemia -Lipitor  Primary hypertension Patient is on valsartan-hydrochlorothiazide as an outpatient. -Continue irbesartan (substituted for valsartan) and hydrochlorothiazide   GERD Patient is only as needed.  Left shoulder pain No dislocation on x-ray.   DVT prophylaxis: Lovenox Code Status:   Code Status: DNR Family Communication: Daughter on telephone Disposition Plan: Discharge pending cardiology  recommendations   Consultants:  Cardiology  Procedures:  TRANSTHORACIC ECHOCARDIOGRAM (05/06/2021) IMPRESSIONS     1. Left ventricular ejection fraction, by estimation, is 70 to 75%. The  left ventricle has hyperdynamic function. The left ventricle has no  regional wall motion abnormalities. There is mild left ventricular  hypertrophy. Left ventricular diastolic  parameters are consistent with Grade I diastolic dysfunction (impaired  relaxation). Elevated left atrial pressure.   2. Right ventricular systolic function is normal. The right ventricular  size is normal.   3. Mild mitral valve regurgitation. Moderate to severe mitral annular  calcification.   4. AV is thickened, calcified with restricted motion. Peak and mean  gradients through the valve are 64 and 38 mm Hg respectively.  Dimensionless index is 0.27 Consistent with moderate to severe AS>  Compared to echo report from 2021, mean gradient is  increased (18mm to 38 mm ) . The aortic valve is abnormal. Aortic valve  regurgitation is not visualized.   5. The inferior vena cava is normal in size with greater than 50%  respiratory variability, suggesting right atrial pressure of 3 mmHg.   Antimicrobials: Ceftriaxone Azithromycin    Subjective: Patient reports that she dislocated her shoulder. Dry cough. No fevers overnight. No chest pain or dyspnea at rest.  Objective: Vitals:   05/06/21 1940 05/06/21 1957 05/06/21 2300 05/07/21 0502  BP:  (!) 162/63 (!) 150/53 (!) 167/64  Pulse:  90 85 77  Resp:  18 20 18   Temp:  98 F (36.7 C) 97.7 F (36.5 C) 98.7 F (37.1 C)  TempSrc:   Oral Oral  SpO2: 97% 97% 97% 99%  Weight:    61 kg  Height:        Intake/Output Summary (Last 24 hours) at 05/07/2021 1217 Last data filed at 05/07/2021 0900 Gross  per 24 hour  Intake 610 ml  Output 1 ml  Net 609 ml   Filed Weights   05/05/21 1457 05/07/21 0502  Weight: 60.3 kg 61 kg    Examination:  General exam: Appears  calm and comfortable Respiratory system: Diminished with no wheezing or rales. Respiratory effort normal. Cardiovascular system: S1 & S2 heard, RRR. 2/6 systolic murmur. Gastrointestinal system: Abdomen is nondistended, soft and nontender. No organomegaly or masses felt. Normal bowel sounds heard. Central nervous system: Alert and oriented. No focal neurological deficits. Musculoskeletal:  No calf tenderness Skin: No cyanosis. Psychiatry: Judgement and insight appear normal. Mood & affect appropriate.     Data Reviewed: I have personally reviewed following labs and imaging studies  CBC Lab Results  Component Value Date   WBC 8.7 05/07/2021   RBC 3.12 (L) 05/07/2021   HGB 9.3 (L) 05/07/2021   HCT 29.9 (L) 05/07/2021   MCV 95.8 05/07/2021   MCH 29.8 05/07/2021   PLT 234 05/07/2021   MCHC 31.1 05/07/2021   RDW 13.9 05/07/2021   LYMPHSABS 1.7 05/06/2021   MONOABS 0.5 05/06/2021   EOSABS 0.1 05/06/2021   BASOSABS 0.0 56/25/6389     Last metabolic panel Lab Results  Component Value Date   NA 138 05/07/2021   K 3.6 05/07/2021   CL 107 05/07/2021   CO2 23 05/07/2021   BUN 19 05/07/2021   CREATININE 0.83 05/07/2021   GLUCOSE 103 (H) 05/07/2021   GFRNONAA >60 05/07/2021   GFRAA >60 11/16/2018   CALCIUM 8.4 (L) 05/07/2021   PHOS 2.8 11/19/2020   PROT 6.1 (L) 05/07/2021   ALBUMIN 2.7 (L) 05/07/2021   BILITOT 0.3 05/07/2021   ALKPHOS 78 05/07/2021   AST 13 (L) 05/07/2021   ALT 12 05/07/2021   ANIONGAP 8 05/07/2021    CBG (last 3)  Recent Labs    05/07/21 0505  GLUCAP 123*     GFR: Estimated Creatinine Clearance: 40.8 mL/min (by C-G formula based on SCr of 0.83 mg/dL).  Coagulation Profile: No results for input(s): INR, PROTIME in the last 168 hours.  Recent Results (from the past 240 hour(s))  Resp Panel by RT-PCR (Flu A&B, Covid) Nasopharyngeal Swab     Status: None   Collection Time: 05/05/21  3:32 PM   Specimen: Nasopharyngeal Swab; Nasopharyngeal(NP)  swabs in vial transport medium  Result Value Ref Range Status   SARS Coronavirus 2 by RT PCR NEGATIVE NEGATIVE Final    Comment: (NOTE) SARS-CoV-2 target nucleic acids are NOT DETECTED.  The SARS-CoV-2 RNA is generally detectable in upper respiratory specimens during the acute phase of infection. The lowest concentration of SARS-CoV-2 viral copies this assay can detect is 138 copies/mL. A negative result does not preclude SARS-Cov-2 infection and should not be used as the sole basis for treatment or other patient management decisions. A negative result may occur with  improper specimen collection/handling, submission of specimen other than nasopharyngeal swab, presence of viral mutation(s) within the areas targeted by this assay, and inadequate number of viral copies(<138 copies/mL). A negative result must be combined with clinical observations, patient history, and epidemiological information. The expected result is Negative.  Fact Sheet for Patients:  EntrepreneurPulse.com.au  Fact Sheet for Healthcare Providers:  IncredibleEmployment.be  This test is no t yet approved or cleared by the [place] FDA and  has been authorized for detection and/or diagnosis of SARS-CoV-2 by FDA under an Emergency Use Authorization (EUA). This EUA will remain  in effect (meaning this test can  be used) for the duration of the COVID-19 declaration under Section 564(b)(1) of the Act, 21 U.S.C.section 360bbb-3(b)(1), unless the authorization is terminated  or revoked sooner.       Influenza A by PCR NEGATIVE NEGATIVE Final   Influenza B by PCR NEGATIVE NEGATIVE Final    Comment: (NOTE) The Xpert Xpress SARS-CoV-2/FLU/RSV plus assay is intended as an aid in the diagnosis of influenza from Nasopharyngeal swab specimens and should not be used as a sole basis for treatment. Nasal washings and aspirates are unacceptable for Xpert Xpress  SARS-CoV-2/FLU/RSV testing.  Fact Sheet for Patients: EntrepreneurPulse.com.au  Fact Sheet for Healthcare Providers: IncredibleEmployment.be  This test is not yet approved or cleared by the [place] FDA and has been authorized for detection and/or diagnosis of SARS-CoV-2 by FDA under an Emergency Use Authorization (EUA). This EUA will remain in effect (meaning this test can be used) for the duration of the COVID-19 declaration under Section 564(b)(1) of the Act, 21 U.S.C. section 360bbb-3(b)(1), unless the authorization is terminated or revoked.  Performed at [facility], [place] [address], [place], [place] [address]         Radiology Studies: DG Chest 2 View  Result Date: 05/05/2021 CLINICAL DATA:  Dyspnea.  Fall.  Cough. EXAM: CHEST - 2 VIEW COMPARISON:  Two-view chest x-ray 11/19/2020 FINDINGS: The heart is enlarged. Atherosclerotic changes are noted at the aortic arch. Changes of COPD are present. Medial right lower lobe airspace disease is present. The upper lung fields are clear. Left shoulder arthroplasty present. Advanced degenerative changes again noted in the right shoulder. IMPRESSION: 1. Medial right lower lobe airspace disease concerning for pneumonia. 2. Stable cardiomegaly without failure. 3. Changes of COPD. Electronically Signed   By: San Morelle M.D.   On: 05/05/2021 16:06   CT Head Wo Contrast  Result Date: 05/05/2021 CLINICAL DATA:  Head trauma, mod-severe EXAM: CT HEAD WITHOUT CONTRAST TECHNIQUE: Contiguous axial images were obtained from the base of the skull through the vertex without intravenous contrast. COMPARISON:  11/19/2020 FINDINGS: Brain: There is no acute intracranial hemorrhage, mass effect, or edema. No new loss of gray-white differentiation. There is no extra-axial fluid collection. Prominence of the ventricles and sulci reflects stable parenchymal volume loss. Confluent areas of low-density in  the supratentorial white matter are nonspecific but probably reflect stable moderate to marked chronic microvascular ischemic changes. Small chronic right occipital infarct. Vascular: There is atherosclerotic calcification at the skull base. Skull: Calvarium is unremarkable. Sinuses/Orbits: No acute finding. Other: None. IMPRESSION: No evidence of acute intracranial injury. Electronically Signed   By: Macy Mis M.D.   On: 05/05/2021 16:05   CT Cervical Spine Wo Contrast  Result Date: 05/05/2021 CLINICAL DATA:  Neck trauma (Age >= 65y) EXAM: CT CERVICAL SPINE WITHOUT CONTRAST TECHNIQUE: Multidetector CT imaging of the cervical spine was performed without intravenous contrast. Multiplanar CT image reconstructions were also generated. COMPARISON:  None. FINDINGS: Alignment: Mild multilevel degenerative listhesis. Skull base and vertebrae: Diffuse degenerative endplate irregularity. No acute fracture. Soft tissues and spinal canal: No prevertebral fluid or swelling. No visible canal hematoma. Disc levels: Multilevel degenerative changes are present including disc space narrowing, endplate osteophytes, and facet and uncovertebral hypertrophy. Upper chest: No apical lung mass. Other: Calcified plaque at the common carotid bifurcations. Retropharyngeal course carotids. IMPRESSION: No acute cervical spine fracture. Electronically Signed   By: Macy Mis M.D.   On: 05/05/2021 16:23   US RENAL  Result Date: 05/06/2021 CLINICAL DATA:  Acute kidney  injury. EXAM: RENAL / URINARY TRACT ULTRASOUND COMPLETE COMPARISON:  Abdominal ultrasound 09/29/2006. FINDINGS: Right Kidney: Renal measurements: 9.2 x 5.2 x 6.3 cm = volume: 150 mL. There is no hydronephrosis. Mildly increased echogenicity. Renal cortical thinning. There is a 2.4 x 1.8 x 2.3 cm cyst in the mid right kidney. There is a hypoechoic area in the inferior pole the right kidney measuring 1.4 x 1.2 x 1.0 cm, also likely cyst, but not well characterized on  this study. Cyst Left Kidney: Renal measurements: 10.1 x 5.8 x 6.0 cm = volume: 184 mL. There is no hydronephrosis. Mildly increased echogenicity. Renal cortical thinning. There are 2 hypoechoic areas in the lower pole the left kidney which are likely cysts, but not well characterized on this study these both measure up to 1.2 cm. Bladder: Appears normal for degree of bladder distention. Other: None. IMPRESSION: 1. Increased renal echogenicity with renal cortical thinning suggestive of medical renal disease. No hydronephrosis. 2. Right renal cyst measuring 22.4 cm. There are additional bilateral hypoechoic areas in both kidneys, likely small cysts, but well characterized on this study. Electronically Signed   By: Ronney Asters M.D.   On: 05/06/2021 19:24   DG Shoulder Left Port  Result Date: 05/07/2021 CLINICAL DATA:  Previous shoulder dislocation. Increased pain this morning. EXAM: LEFT SHOULDER COMPARISON:  10/05/2009 FINDINGS: Left total shoulder prosthesis is well seated without periprosthetic fracture or lucency. Moderate degenerative changes of the acromioclavicular joint. Degenerative changes seen throughout the cervical spine. IMPRESSION: Uncomplicated left total shoulder prosthesis. Electronically Signed   By: Miachel Roux M.D.   On: 05/07/2021 08:48   ECHOCARDIOGRAM COMPLETE  Result Date: 05/06/2021    ECHOCARDIOGRAM REPORT   Patient Name:   Brandy [name] [facility] Date of Exam: 05/06/2021 Medical Rec #:  [id number]       Height:       61.0 in Accession #:    [id number]      Weight:       133.0 lb Date of Birth:  March 24, 1933        BSA:          1.588 m Patient Age:    10 years        BP:           143/61 mmHg Patient Gender: F               HR:           87 bpm. Exam Location:  Inpatient Procedure: 2D Echo, Cardiac Doppler and Color Doppler Indications:    Syncope R55  History:        Patient has prior history of Echocardiogram examinations, most                 recent 09/08/2019. Aortic Valve Disease and  Mitral Valve Disease;                 Risk Factors:Hypertension and Dyslipidemia. GERD. Past history                 of breast cancer.  Sonographer:    Darlina Sicilian RDCS Referring Phys: [id number] Jonnie Finner  Sonographer Comments: PEDOF attempted. IMPRESSIONS  1. Left ventricular ejection fraction, by estimation, is 70 to 75%. The left ventricle has hyperdynamic function. The left ventricle has no regional wall motion abnormalities. There is mild left ventricular hypertrophy. Left ventricular diastolic parameters are consistent with Grade I diastolic dysfunction (impaired relaxation). Elevated left atrial pressure.  2. Right ventricular  systolic function is normal. The right ventricular size is normal.  3. Mild mitral valve regurgitation. Moderate to severe mitral annular calcification.  4. AV is thickened, calcified with restricted motion. Peak and mean gradients through the valve are 64 and 38 mm Hg respectively. Dimensionless index is 0.27 Consistent with moderate to severe AS> Compared to echo report from 2021, mean gradient is increased (60mm to 38 mm ) . The aortic valve is abnormal. Aortic valve regurgitation is not visualized.  5. The inferior vena cava is normal in size with greater than 50% respiratory variability, suggesting right atrial pressure of 3 mmHg. FINDINGS  Left Ventricle: Left ventricular ejection fraction, by estimation, is 70 to 75%. The left ventricle has hyperdynamic function. The left ventricle has no regional wall motion abnormalities. The left ventricular internal cavity size was normal in size. There is mild left ventricular hypertrophy. Left ventricular diastolic parameters are consistent with Grade I diastolic dysfunction (impaired relaxation). Elevated left atrial pressure. Right Ventricle: The right ventricular size is normal. Right vetricular wall thickness was not assessed. Right ventricular systolic function is normal. Left Atrium: Left atrial size was normal in size. Right  Atrium: Right atrial size was normal in size. Pericardium: There is no evidence of pericardial effusion. Mitral Valve: There is moderate thickening of the mitral valve leaflet(s). Moderate to severe mitral annular calcification. Mild mitral valve regurgitation. MV peak gradient, 10.7 mmHg. The mean mitral valve gradient is 4.0 mmHg. Tricuspid Valve: The tricuspid valve is normal in structure. Tricuspid valve regurgitation is trivial. Aortic Valve: AV is thickened, calcified with restricted motion. Peak and mean gradients through the valve are 64 and 38 mm Hg respectively. Dimensionless index is 0.27 Consistent with moderate to severe AS> Compared to echo report from 2021, mean gradient is increased (74mm to 38 mm ). The aortic valve is abnormal. Aortic valve regurgitation is not visualized. Aortic valve mean gradient measures 34.5 mmHg. Aortic valve peak gradient measures 56.4 mmHg. Aortic valve area, by VTI measures 0.76 cm. Pulmonic Valve: The pulmonic valve was not well visualized. Pulmonic valve regurgitation is not visualized. Aorta: The aortic root and ascending aorta are structurally normal, with no evidence of dilitation. Venous: The inferior vena cava is normal in size with greater than 50% respiratory variability, suggesting right atrial pressure of 3 mmHg. IAS/Shunts: No atrial level shunt detected by color flow Doppler.  LEFT VENTRICLE PLAX 2D LVIDd:         4.10 cm   Diastology LVIDs:         2.70 cm   LV e' medial:    3.37 cm/s LV PW:         1.20 cm   LV E/e' medial:  31.2 LV IVS:        1.20 cm   LV e' lateral:   5.55 cm/s LVOT diam:     1.90 cm   LV E/e' lateral: 18.9 LV SV:         61 LV SV Index:   38 LVOT Area:     2.84 cm  RIGHT VENTRICLE RV S prime:     12.40 cm/s TAPSE (M-mode): 1.6 cm LEFT ATRIUM             Index        RIGHT ATRIUM           Index LA diam:        3.20 cm 2.02 cm/m   RA Area:     12.90 cm LA Vol (  A2C):   42.5 ml 26.76 ml/m  RA Volume:   24.50 ml  15.43 ml/m LA Vol  (A4C):   28.6 ml 18.01 ml/m LA Biplane Vol: 34.9 ml 21.98 ml/m  AORTIC VALVE AV Area (Vmax):    0.88 cm AV Area (Vmean):   0.78 cm AV Area (VTI):     0.76 cm AV Vmax:           375.50 cm/s AV Vmean:          279.750 cm/s AV VTI:            0.798 m AV Peak Grad:      56.4 mmHg AV Mean Grad:      34.5 mmHg LVOT Vmax:         116.00 cm/s LVOT Vmean:        76.700 cm/s LVOT VTI:          0.214 m LVOT/AV VTI ratio: 0.27  AORTA Ao Root diam: 3.60 cm MITRAL VALVE MV Area (PHT): 1.68 cm     SHUNTS MV Area VTI:   1.56 cm     Systemic VTI:  0.21 m MV Peak grad:  10.7 mmHg    Systemic Diam: 1.90 cm MV Mean grad:  4.0 mmHg MV Vmax:       1.64 m/s MV Vmean:      91.7 cm/s MV Decel Time: 451 msec MV E velocity: 105.00 cm/s MV A velocity: 170.00 cm/s MV E/A ratio:  0.62 Dorris Carnes MD Electronically signed by Dorris Carnes MD Signature Date/Time: 05/06/2021/5:32:02 PM    Final         Scheduled Meds:  azithromycin  500 mg Oral Daily   enoxaparin (LOVENOX) injection  40 mg Subcutaneous Q24H   guaiFENesin  600 mg Oral BID   sodium chloride flush  3 mL Intravenous Q12H   Continuous Infusions:  cefTRIAXone (ROCEPHIN)  IV Stopped (05/06/21 2054)     LOS: 1 day     Cordelia Poche, MD [facility] 05/07/2021, 12:17 PM  If 7PM-7AM, please contact night-coverage [email or web address]

## 2021-05-07 NOTE — Evaluation (Signed)
Physical Therapy Evaluation Patient Details Name: Brandy [name] MRN: [id number] DOB: 29-Dec-1932 Today's Date: 05/07/2021  History of Present Illness  87 y.o. female admitted after fall at home and hitting head. Pt with slightly elevated troponin. CT of head showing no acute intracranial injury. CT of spine showing no acute cervical spine fracture. Chest x ray revealing  concern for pneumonia.PMH of depression, breast ca, HTN, R THA, L total shoudler replacement.  Clinical Impression  The  patient is very pleasant. Ambulated with min guard x 50' using RW.  Patient stated" I need more help at home. My granddaughter works. "  Patient can benefit from PT while in acute care.          Recommendations for follow up therapy are one component of a multi-disciplinary discharge planning process, led by the attending physician.  Recommendations may be updated based on patient status, additional functional criteria and insurance authorization.  Follow Up Recommendations Home health PT (aide)    Assistance Recommended at Discharge Intermittent Supervision/Assistance  Functional Status Assessment Patient has had a recent decline in their functional status and demonstrates the ability to make significant improvements in function in a reasonable and predictable amount of time.  Equipment Recommendations  None recommended by PT    Recommendations for Other Services       Precautions / Restrictions Precautions Precautions: Fall Restrictions Weight Bearing Restrictions: No      Mobility  Bed Mobility Overal bed mobility: Needs Assistance             General bed mobility comments: in recliner    Transfers Overall transfer level: Needs assistance Equipment used: Rolling walker (2 wheels) Transfers: Sit to/from Stand Sit to Stand: Min guard           General transfer comment: min guard for safety    Ambulation/Gait Ambulation/Gait assistance: Min guard Gait Distance (Feet): 50  Feet Assistive device: Rolling walker (2 wheels) Gait Pattern/deviations: Step-through pattern     General Gait Details: generally steady  Stairs            Wheelchair Mobility    Modified Rankin (Stroke Patients Only)       Balance Overall balance assessment: History of Falls;Needs assistance Sitting-balance support: Feet supported;No upper extremity supported Sitting balance-Leahy Scale: Good     Standing balance support: During functional activity;Bilateral upper extremity supported;Reliant on assistive device for balance Standing balance-Leahy Scale: Fair                               Pertinent Vitals/Pain Pain Assessment: Faces Faces Pain Scale: Hurts even more Pain Location: L shoulder Pain Descriptors / Indicators: Discomfort Pain Intervention(s): Limited activity within patient's tolerance;Monitored during session    Home Living Family/patient expects to be discharged to:: Private residence Living Arrangements: Other relatives Available Help at Discharge: Available PRN/intermittently (grandaughter lives with her, works during day) Type of Home: House Home Access: Ramped entrance       Home Layout: One level Home Equipment: Advice worker (2 wheels);Rollator (4 wheels);Cane - single point;Grab bars - tub/shower      Prior Function Prior Level of Function : Independent/Modified Independent             Mobility Comments: functional mobility with walker at home ADLs Comments: daughter and neighbor assited with all of bathing. Required asssitance with putting socks on and with shirt due to "broken L arm"     Hand  Dominance   Dominant Hand: Right    Extremity/Trunk Assessment   Upper Extremity Assessment Upper Extremity Assessment: Defer to OT evaluation RUE Deficits / Details: able to perform shoulder flexion to 90*, reports baseline LUE Deficits / Details: able to flex shoulder slightly less thanm 1/2 ROM, reports L  shoulder pain with movement, reports baseline.    Lower Extremity Assessment Lower Extremity Assessment: Generalized weakness    Cervical / Trunk Assessment Cervical / Trunk Assessment: Normal  Communication      Cognition Arousal/Alertness: Awake/alert Behavior During Therapy: WFL for tasks assessed/performed Overall Cognitive Status: Within Functional Limits for tasks assessed                                 General Comments: pleasant        General Comments      Exercises     Assessment/Plan    PT Assessment Patient needs continued PT services  PT Problem List Decreased strength;Decreased range of motion;Decreased activity tolerance;Decreased balance;Pain;Decreased safety awareness       PT Treatment Interventions DME instruction;Therapeutic activities;Gait training;Functional mobility training;Therapeutic exercise;Patient/family education    PT Goals (Current goals can be found in the Care Plan section)  Acute Rehab PT Goals Patient Stated Goal: I need  help at home PT Goal Formulation: With patient Time For Goal Achievement: 05/21/21 Potential to Achieve Goals: Fair    Frequency Min 3X/week   Barriers to discharge Decreased caregiver support      Co-evaluation               AM-PAC PT "6 Clicks" Mobility  Outcome Measure Help needed turning from your back to your side while in a flat bed without using bedrails?: A Little Help needed moving from lying on your back to sitting on the side of a flat bed without using bedrails?: A Little Help needed moving to and from a bed to a chair (including a wheelchair)?: A Little Help needed standing up from a chair using your arms (e.g., wheelchair or bedside chair)?: A Little Help needed to walk in hospital room?: A Little Help needed climbing 3-5 steps with a railing? : A Little 6 Click Score: 18    End of Session Equipment Utilized During Treatment: Gait belt Activity Tolerance: Patient  tolerated treatment well Patient left: in chair;with call bell/phone within reach;with chair alarm set Nurse Communication: Mobility status PT Visit Diagnosis: Unsteadiness on feet (R26.81)    Time: 1030-1053 PT Time Calculation (min) (ACUTE ONLY): 23 min   Charges:   PT Evaluation $PT Eval Low Complexity: 1 Low PT Treatments $Gait Training: 8-22 mins        [place] Pager [phone] Office [phone]   Claretha Cooper 05/07/2021, 1:55 PM

## 2021-05-07 NOTE — Evaluation (Signed)
Occupational Therapy Evaluation Patient Details Name: Brandy [name] MRN: [id number] DOB: [birth year] Today's Date: 05/07/2021   History of Present Illness 87 y.o. female admitted after fall at home and hitting head. Pt with slightly elevated troponin. CT of head showing no acute intracranial injury. CT of spine showing no acute cervical spine fracture. Chest x ray revealing  concern for pneumonia.PMH of depression, breast ca, HTN, R THA, L total shoudler replacement.   Clinical Impression   PTA, pt was living at home with grandaughter who assisted her with dressing, bathing, and IADLs. Pt currently at baseline functionally (baseline from past 2 weeks and previous fall), but with increased pain in L shoulder and limited L shoulder ROM. Pt SpO2 maintaining above 94% throughout functional mobility room distance to bathroom with min guard and RW. Patient will benefit from skilled OT services while in hospital to improve deficits and education on HEP to avoid effects of prolonged decreased shoulder movement as needed in order to return to PLOF. Therapist educated pt on following up with ortho an ortho MD post d/c to assess L shoulder deficits/needs. Pt verbalized understanding of education. Due to caregiver support and current level of function, recommending home with intermittent supervision.      Recommendations for follow up therapy are one component of a multi-disciplinary discharge planning process, led by the attending physician.  Recommendations may be updated based on patient status, additional functional criteria and insurance authorization.   Follow Up Recommendations  No OT follow up    Assistance Recommended at Discharge Intermittent Supervision/Assistance  Functional Status Assessment  Patient has had a recent decline in their functional status and demonstrates the ability to make significant improvements in function in a reasonable and predictable amount of time.  Equipment  Recommendations  None recommended by OT    Recommendations for Other Services       Precautions / Restrictions Precautions Precautions: Fall Restrictions Weight Bearing Restrictions: No      Mobility Bed Mobility Overal bed mobility: Needs Assistance             General bed mobility comments: Min a for bed mobility, used rail and HOB slightly raised    Transfers Overall transfer level: Needs assistance Equipment used: Rolling walker (2 wheels) Transfers: Sit to/from Stand             General transfer comment: min guard for safety      Balance Overall balance assessment: History of Falls                                         ADL either performed or assessed with clinical judgement   ADL Overall ADL's : At baseline                                             Vision Patient Visual Report: No change from baseline       Perception     Praxis      Pertinent Vitals/Pain       Hand Dominance Right   Extremity/Trunk Assessment Upper Extremity Assessment Upper Extremity Assessment: RUE deficits/detail;LUE deficits/detail RUE Deficits / Details: able to perform shoulder flexion to 90*, reports baseline LUE Deficits / Details: able to flex shoulder with 15-30 * ROM, reports L shoulder pain  with movement.   Lower Extremity Assessment Lower Extremity Assessment: Defer to PT evaluation   Cervical / Trunk Assessment Cervical / Trunk Assessment: Normal   Communication     Cognition Arousal/Alertness: Awake/alert Behavior During Therapy: WFL for tasks assessed/performed Overall Cognitive Status: Within Functional Limits for tasks assessed                                 General Comments: pleasant     General Comments       Exercises     Shoulder Instructions      Home Living Family/patient expects to be discharged to:: Private residence Living Arrangements: Other relatives Available Help  at Discharge: Available PRN/intermittently (grandaughter lives with her, works during day) Type of Home: House Home Access: Ramped entrance     Home Layout: One level     Bathroom Shower/Tub: Walk-in Corporate treasurer Toilet: Handicapped height     Home Equipment: Advice worker (2 wheels);Rollator (4 wheels);Cane - single point;Grab bars - tub/shower          Prior Functioning/Environment Prior Level of Function : Independent/Modified Independent             Mobility Comments: functional mobility with walker at home ADLs Comments: daughter and neighbor assited with all of bathing. Required asssitance with putting socks on and with shirt due to "broken L arm"        OT Problem List: Decreased strength;Decreased range of motion;Pain      OT Treatment/Interventions: Therapeutic exercise;Therapeutic activities;Balance training    OT Goals(Current goals can be found in the care plan section) Acute Rehab OT Goals Patient Stated Goal: to go home OT Goal Formulation: With patient Time For Goal Achievement: 05/21/21 Potential to Achieve Goals: Good  OT Frequency: Min 2X/week   Barriers to D/C:            Co-evaluation              AM-PAC OT "6 Clicks" Daily Activity     Outcome Measure Help from another person eating meals?: None Help from another person taking care of personal grooming?: A Little Help from another person toileting, which includes using toliet, bedpan, or urinal?: A Little Help from another person bathing (including washing, rinsing, drying)?: A Lot Help from another person to put on and taking off regular upper body clothing?: A Little Help from another person to put on and taking off regular lower body clothing?: A Lot 6 Click Score: 17   End of Session Equipment Utilized During Treatment: Rolling walker (2 wheels);Oxygen Nurse Communication: Other (comment) (decreased need for O2)  Activity Tolerance: Patient tolerated  treatment well Patient left: in chair;with call bell/phone within reach;with chair alarm set  OT Visit Diagnosis: Muscle weakness (generalized) (M62.81);Pain Pain - Right/Left: Left Pain - part of body: Shoulder                Time: 6834-1962 OT Time Calculation (min): 56 min Charges:  OT General Charges $OT Visit: 1 Visit OT Evaluation $OT Eval Low Complexity: 1 Low OT Treatments $Self Care/Home Management : 8-22 mins $Therapeutic Activity: 8-22 mins  Jackquline Denmark, OTS Acute Rehab Office: [phone]   Lulla Linville 05/07/2021, 10:50 AM

## 2021-05-08 ENCOUNTER — Inpatient Hospital Stay (HOSPITAL_COMMUNITY)
Admission: EM | Disposition: A | Discharge status: Home Health | Payer: Self-pay | Source: Home / Self Care | Attending: Internal Medicine

## 2021-05-08 ENCOUNTER — Encounter (HOSPITAL_COMMUNITY): Payer: Self-pay | Admitting: Internal Medicine

## 2021-05-08 HISTORY — PX: RIGHT/LEFT HEART CATH AND CORONARY ANGIOGRAPHY: CATH118266

## 2021-05-08 LAB — BASIC METABOLIC PANEL
Anion gap: 7 (ref 5–15)
BUN: 14 mg/dL (ref 8–23)
CO2: 25 mmol/L (ref 22–32)
Calcium: 8.4 mg/dL — ABNORMAL LOW (ref 8.9–10.3)
Chloride: 106 mmol/L (ref 98–111)
Creatinine, Ser: 0.86 mg/dL (ref 0.44–1.00)
GFR, Estimated: >60 mL/min (ref >60)
Glucose, Bld: 120 mg/dL — ABNORMAL HIGH (ref 70–99)
Potassium: 4 mmol/L (ref 3.5–5.1)
Sodium: 138 mmol/L (ref 135–145)

## 2021-05-08 LAB — CBC
HCT: 27.7 % — ABNORMAL LOW (ref 36.0–46.0)
Hemoglobin: 8.8 g/dL — ABNORMAL LOW (ref 12.0–15.0)
MCH: 30 pg (ref 26.0–34.0)
MCHC: 31.8 g/dL (ref 30.0–36.0)
MCV: 94.5 fL (ref 80.0–100.0)
Platelets: 278 10*3/uL (ref 150–400)
RBC: 2.93 MIL/uL — ABNORMAL LOW (ref 3.87–5.11)
RDW: 13.4 % (ref 11.5–15.5)
WBC: 7.5 10*3/uL (ref 4.0–10.5)
nRBC: 0 % (ref 0.0–0.2)

## 2021-05-08 LAB — GLUCOSE, CAPILLARY: Glucose-Capillary: 98 mg/dL (ref 70–99)

## 2021-05-08 SURGERY — RIGHT/LEFT HEART CATH AND CORONARY ANGIOGRAPHY
Anesthesia: LOCAL

## 2021-05-08 MED ORDER — HEPARIN SODIUM (PORCINE) 1000 UNIT/ML IJ SOLN
INTRAMUSCULAR | Status: DC | PRN
  Administered 2021-05-08: 3000 [IU] via INTRAVENOUS

## 2021-05-08 MED ORDER — SODIUM CHLORIDE 0.9 % IV SOLN: INTRAVENOUS | Status: AC

## 2021-05-08 MED ORDER — ONDANSETRON HCL 4 MG/2ML IJ SOLN: 4.0000 mg | Freq: Four times a day (QID) | INTRAMUSCULAR | Status: DC | PRN

## 2021-05-08 MED ORDER — FENTANYL CITRATE (PF) 100 MCG/2ML IJ SOLN
INTRAMUSCULAR | Status: AC
  Filled 2021-05-08: qty 2

## 2021-05-08 MED ORDER — VERAPAMIL HCL 2.5 MG/ML IV SOLN
INTRAVENOUS | Status: AC
  Filled 2021-05-08: qty 2

## 2021-05-08 MED ORDER — MIDAZOLAM HCL 2 MG/2ML IJ SOLN
INTRAMUSCULAR | Status: DC | PRN
  Administered 2021-05-08: 1 mg via INTRAVENOUS

## 2021-05-08 MED ORDER — LIDOCAINE HCL (PF) 1 % IJ SOLN
INTRAMUSCULAR | Status: AC
  Filled 2021-05-08: qty 30

## 2021-05-08 MED ORDER — SODIUM CHLORIDE 0.9 % WEIGHT BASED INFUSION
1.0000 mL/kg/h | INTRAVENOUS | Status: DC
  Administered 2021-05-08: 1 mL/kg/h via INTRAVENOUS

## 2021-05-08 MED ORDER — SODIUM CHLORIDE 0.9 % IV SOLN: 250.0000 mL | INTRAVENOUS | Status: DC | PRN

## 2021-05-08 MED ORDER — SODIUM CHLORIDE 0.9% FLUSH
3.0000 mL | INTRAVENOUS | Status: DC | PRN
  Administered 2021-05-08: 3 mL via INTRAVENOUS

## 2021-05-08 MED ORDER — IOHEXOL 350 MG/ML SOLN
INTRAVENOUS | Status: DC | PRN
  Administered 2021-05-08: 50 mL

## 2021-05-08 MED ORDER — ACETAMINOPHEN 325 MG PO TABS: 650.0000 mg | ORAL_TABLET | ORAL | Status: DC | PRN

## 2021-05-08 MED ORDER — FENTANYL CITRATE (PF) 100 MCG/2ML IJ SOLN
INTRAMUSCULAR | Status: DC | PRN
  Administered 2021-05-08 (×3): 25 ug via INTRAVENOUS

## 2021-05-08 MED ORDER — ASPIRIN 81 MG PO CHEW
81.0000 mg | CHEWABLE_TABLET | ORAL | Status: AC
  Administered 2021-05-08: 81 mg via ORAL

## 2021-05-08 MED ORDER — SODIUM CHLORIDE 0.9% FLUSH
3.0000 mL | Freq: Two times a day (BID) | INTRAVENOUS | Status: DC
  Administered 2021-05-08: 3 mL via INTRAVENOUS

## 2021-05-08 MED ORDER — SODIUM CHLORIDE 0.9 % WEIGHT BASED INFUSION
3.0000 mL/kg/h | INTRAVENOUS | Status: DC
  Administered 2021-05-08: 3 mL/kg/h via INTRAVENOUS

## 2021-05-08 MED ORDER — ASPIRIN 81 MG PO CHEW
CHEWABLE_TABLET | ORAL | Status: AC
  Filled 2021-05-08: qty 1

## 2021-05-08 MED ORDER — LABETALOL HCL 5 MG/ML IV SOLN
INTRAVENOUS | Status: AC
  Filled 2021-05-08: qty 4

## 2021-05-08 MED ORDER — LABETALOL HCL 5 MG/ML IV SOLN
10.0000 mg | INTRAVENOUS | Status: AC | PRN
  Administered 2021-05-08 (×3): 10 mg via INTRAVENOUS

## 2021-05-08 MED ORDER — SODIUM CHLORIDE 0.9 % WEIGHT BASED INFUSION: 3.0000 mL/kg/h | INTRAVENOUS | Status: DC

## 2021-05-08 MED ORDER — HEPARIN (PORCINE) IN NACL 1000-0.9 UT/500ML-% IV SOLN
INTRAVENOUS | Status: DC | PRN
  Administered 2021-05-08 (×2): 500 mL

## 2021-05-08 MED ORDER — ASPIRIN 81 MG PO CHEW: 81.0000 mg | CHEWABLE_TABLET | ORAL | Status: DC

## 2021-05-08 MED ORDER — MIDAZOLAM HCL 2 MG/2ML IJ SOLN
INTRAMUSCULAR | Status: AC
  Filled 2021-05-08: qty 2

## 2021-05-08 MED ORDER — SODIUM CHLORIDE 0.9 % WEIGHT BASED INFUSION: 1.0000 mL/kg/h | INTRAVENOUS | Status: DC

## 2021-05-08 MED ORDER — HYDRALAZINE HCL 20 MG/ML IJ SOLN: 10.0000 mg | INTRAMUSCULAR | Status: AC | PRN

## 2021-05-08 MED ORDER — HEPARIN (PORCINE) IN NACL 1000-0.9 UT/500ML-% IV SOLN
INTRAVENOUS | Status: AC
  Filled 2021-05-08: qty 500

## 2021-05-08 MED ORDER — LIDOCAINE HCL (PF) 1 % IJ SOLN
INTRAMUSCULAR | Status: DC | PRN
  Administered 2021-05-08 (×2): 2 mL

## 2021-05-08 SURGICAL SUPPLY — 15 items
CATH 5FR JL3.5 JR4 ANG PIG MP (CATHETERS) ×2 IMPLANT
CATH BALLN WEDGE 5F 110CM (CATHETERS) ×2 IMPLANT
CATH INFINITI 5 FR 3DRC (CATHETERS) ×2 IMPLANT
CATH INFINITI 5FR JL4 (CATHETERS) ×2 IMPLANT
DEVICE RAD TR BAND REGULAR (VASCULAR PRODUCTS) ×2 IMPLANT
GLIDESHEATH SLEND SS 6F .021 (SHEATH) ×2 IMPLANT
GUIDEWIRE INQWIRE 1.5J.035X260 (WIRE) ×2 IMPLANT
INQWIRE 1.5J .035X260CM (WIRE) ×4
KIT HEART LEFT (KITS) ×2 IMPLANT
PACK CARDIAC CATHETERIZATION (CUSTOM PROCEDURE TRAY) ×2 IMPLANT
SHEATH 6FR 75 DEST SLENDER (SHEATH) ×2 IMPLANT
SHEATH GLIDE SLENDER 4/5FR (SHEATH) ×2 IMPLANT
SHEATH PROBE COVER 6X72 (BAG) ×2 IMPLANT
TRANSDUCER W/STOPCOCK (MISCELLANEOUS) ×2 IMPLANT
TUBING CIL FLEX 10 FLL-RA (TUBING) ×2 IMPLANT

## 2021-05-08 NOTE — Progress Notes (Addendum)
Progress Note    Brandy [name]  GMW:[id number] DOB: November 01, 1932  DOA: 05/05/2021 PCP: Kathyrn Lass, MD      Brief Narrative:    Medical records reviewed and are as summarized below:  Brandy [name] is a 87 y.o. female with past medical history significant for dementia, hypertension, hyperlipidemia, aortic stenosis, who presented to the hospital because of fall at home and suspected syncope.  She was admitted to the hospital for acute hypoxemic respiratory failure and community-acquired pneumonia.  She was treated with empiric IV antibiotics.  Cardiology was consulted because of suspected syncope.  She underwent right and left heart cath but there was no evidence of CAD.    Assessment/Plan:   Active Problems:   Aortic valve stenosis   Acute on chronic respiratory failure with hypoxemia (HCC)   PNA (pneumonia)   Syncope and collapse    Body mass index is 25.62 kg/m.  Community-acquired pneumonia: Continue empiric IV antibiotics  Acute hypoxemic respiratory failure: Improved with  S/p fall, suspected s/p syncope and aortic stenosis: S/p left and right heart cath but no evidence of CAD.  Continue PT.  Scalp laceration: Stapled in the ED.  Staples to be removed as an outpatient.  Uncontrolled hypertension: Continue antihypertensives  AKI: Improved.  Repeat BMP tomorrow because of recent cardiac cath  Other comorbidities include dementia, hypertension, hyperlipidemia, GERD  Diet Order     None          Consultants: Cardiologist  Procedures: Left and right heart cath    Medications:    atorvastatin  40 mg Oral Daily   azithromycin  500 mg Oral Daily   DULoxetine  20 mg Oral BID   enoxaparin (LOVENOX) injection  40 mg Subcutaneous Q24H   gabapentin  300 mg Oral QHS   guaiFENesin  600 mg Oral BID   irbesartan  75 mg Oral Daily   And   hydrochlorothiazide  12.5 mg Oral Daily   sodium chloride flush  3 mL Intravenous Q12H   sodium chloride  flush  3 mL Intravenous Q12H   Continuous Infusions:  cefTRIAXone (ROCEPHIN)  IV Stopped (05/07/21 1650)     Anti-infectives (From admission, onward)    Start     Dose/Rate Route Frequency Ordered Stop   05/06/21 1600  cefTRIAXone (ROCEPHIN) 1 g in sodium chloride 0.9 % 100 mL IVPB        1 g 200 mL/hr over 30 Minutes Intravenous Every 24 hours 05/06/21 1107     05/06/21 1200  azithromycin (ZITHROMAX) tablet 500 mg        500 mg Oral Daily 05/06/21 1107     05/05/21 1615  cefTRIAXone (ROCEPHIN) 2 g in sodium chloride 0.9 % 100 mL IVPB        2 g 200 mL/hr over 30 Minutes Intravenous  Once 05/05/21 1613 05/05/21 1701   05/05/21 1615  azithromycin (ZITHROMAX) 500 mg in sodium chloride 0.9 % 250 mL IVPB        500 mg 250 mL/hr over 60 Minutes Intravenous  Once 05/05/21 1613 05/05/21 1811              Family Communication/Anticipated D/C date and plan/Code Status   DVT prophylaxis: enoxaparin (LOVENOX) injection 40 mg Start: 05/06/21 1800     Code Status: DNR  Family Communication: None Disposition Plan: Plan to discharge home tomorrow   Status is: Inpatient  Remains inpatient appropriate because: IV antibiotics  Subjective:   Interval events noted.  She went for left and right heart cath today.  Objective:    Vitals:   05/08/21 1345 05/08/21 1400 05/08/21 1440 05/08/21 1445  BP: (!) 171/79 (!) 174/50 (!) 148/38 (!) 178/42  Pulse: 67 72 73 75  Resp: 19 (!) 25 20 20   Temp:      TempSrc:      SpO2: 95% 96% 95% 96%  Weight:      Height:       No data found.   Intake/Output Summary (Last 24 hours) at 05/08/2021 1600 Last data filed at 05/08/2021 0630 Gross per 24 hour  Intake 100 ml  Output --  Net 100 ml   Filed Weights   05/05/21 1457 05/07/21 0502 05/08/21 0500  Weight: 60.3 kg 61 kg 61.5 kg    Exam:  GEN: NAD SKIN: No rash EYES: EOMI ENT: MMM CV: RRR PULM: CTA B ABD: soft, ND, NT, +BS CNS: AAO x 3, non focal EXT: No  edema or tenderness        Data Reviewed:   I have personally reviewed following labs and imaging studies:  Labs: Labs show the following:   Basic Metabolic Panel: Recent Labs  Lab 05/05/21 1532 05/06/21 1113 05/07/21 0459  NA 137 137 138  K 4.3 3.8 3.6  CL 105 106 107  CO2 22 24 23   GLUCOSE 153* 119* 103*  BUN 26* 26* 19  CREATININE 1.29* 0.86 0.83  CALCIUM 8.9 8.4* 8.4*   GFR Estimated Creatinine Clearance: 40.9 mL/min (by C-G formula based on SCr of 0.83 mg/dL). Liver Function Tests: Recent Labs  Lab 05/05/21 1532 05/06/21 1113 05/07/21 0459  AST 17 14* 13*  ALT 12 11 12   ALKPHOS 86 81 78  BILITOT 0.6 0.4 0.3  PROT 6.6 6.2* 6.1*  ALBUMIN 3.1* 2.8* 2.7*   No results for input(s): LIPASE, AMYLASE in the last 168 hours. No results for input(s): AMMONIA in the last 168 hours. Coagulation profile No results for input(s): INR, PROTIME in the last 168 hours.  CBC: Recent Labs  Lab 05/05/21 1532 05/06/21 1113 05/07/21 0459  WBC 16.0* 11.5* 8.7  NEUTROABS 13.5* 9.0*  --   HGB 9.6* 9.0* 9.3*  HCT 29.7* 28.3* 29.9*  MCV 94.0 95.6 95.8  PLT 262 224 234   Cardiac Enzymes: No results for input(s): CKTOTAL, CKMB, CKMBINDEX, TROPONINI in the last 168 hours. BNP (last 3 results) No results for input(s): PROBNP in the last 8760 hours. CBG: Recent Labs  Lab 05/07/21 0505 05/08/21 0528  GLUCAP 123* 98   D-Dimer: No results for input(s): DDIMER in the last 72 hours. Hgb A1c: No results for input(s): HGBA1C in the last 72 hours. Lipid Profile: No results for input(s): CHOL, HDL, LDLCALC, TRIG, CHOLHDL, LDLDIRECT in the last 72 hours. Thyroid function studies: No results for input(s): TSH, T4TOTAL, T3FREE, THYROIDAB in the last 72 hours.  Invalid input(s): FREET3 Anemia work up: No results for input(s): VITAMINB12, FOLATE, FERRITIN, TIBC, IRON, RETICCTPCT in the last 72 hours. Sepsis Labs: Recent Labs  Lab 05/05/21 1532 05/06/21 1113 05/07/21 0459   WBC 16.0* 11.5* 8.7  LATICACIDVEN 1.8  --   --     Microbiology Recent Results (from the past 240 hour(s))  Resp Panel by RT-PCR (Flu A&B, Covid) Nasopharyngeal Swab     Status: None   Collection Time: 05/05/21  3:32 PM   Specimen: Nasopharyngeal Swab; Nasopharyngeal(NP) swabs in vial transport medium  Result Value Ref Range  Status   SARS Coronavirus 2 by RT PCR NEGATIVE NEGATIVE Final    Comment: (NOTE) SARS-CoV-2 target nucleic acids are NOT DETECTED.  The SARS-CoV-2 RNA is generally detectable in upper respiratory specimens during the acute phase of infection. The lowest concentration of SARS-CoV-2 viral copies this assay can detect is 138 copies/mL. A negative result does not preclude SARS-Cov-2 infection and should not be used as the sole basis for treatment or other patient management decisions. A negative result may occur with  improper specimen collection/handling, submission of specimen other than nasopharyngeal swab, presence of viral mutation(s) within the areas targeted by this assay, and inadequate number of viral copies(<138 copies/mL). A negative result must be combined with clinical observations, patient history, and epidemiological information. The expected result is Negative.  Fact Sheet for Patients:  EntrepreneurPulse.com.au  Fact Sheet for Healthcare Providers:  IncredibleEmployment.be  This test is no t yet approved or cleared by the [place] FDA and  has been authorized for detection and/or diagnosis of SARS-CoV-2 by FDA under an Emergency Use Authorization (EUA). This EUA will remain  in effect (meaning this test can be used) for the duration of the COVID-19 declaration under Section 564(b)(1) of the Act, 21 U.S.C.section 360bbb-3(b)(1), unless the authorization is terminated  or revoked sooner.       Influenza A by PCR NEGATIVE NEGATIVE Final   Influenza B by PCR NEGATIVE NEGATIVE Final    Comment:  (NOTE) The Xpert Xpress SARS-CoV-2/FLU/RSV plus assay is intended as an aid in the diagnosis of influenza from Nasopharyngeal swab specimens and should not be used as a sole basis for treatment. Nasal washings and aspirates are unacceptable for Xpert Xpress SARS-CoV-2/FLU/RSV testing.  Fact Sheet for Patients: EntrepreneurPulse.com.au  Fact Sheet for Healthcare Providers: IncredibleEmployment.be  This test is not yet approved or cleared by the [place] FDA and has been authorized for detection and/or diagnosis of SARS-CoV-2 by FDA under an Emergency Use Authorization (EUA). This EUA will remain in effect (meaning this test can be used) for the duration of the COVID-19 declaration under Section 564(b)(1) of the Act, 21 U.S.C. section 360bbb-3(b)(1), unless the authorization is terminated or revoked.  Performed at [facility], [place] [address], [place], [place] [address]     Procedures and diagnostic studies:  CARDIAC CATHETERIZATION  Result Date: 05/08/2021   No angiographically apparent coronary artery disease.   Ao sat 94%, PA sat 63%, PA pressure 26/6, mean PA pressure 13 mmHg, mean pulmonary capillary wedge pressure 5 mmHg, cardiac output 5.4 L/min, cardiac index 3.39.   Severe right subclavian tortuosity.  If cardiac cath was needed in the future, would not use right radial approach.   Did not cross aortic valve. Continue with plans for TAVR work-up.   US RENAL  Result Date: 05/06/2021 CLINICAL DATA:  Acute kidney injury. EXAM: RENAL / URINARY TRACT ULTRASOUND COMPLETE COMPARISON:  Abdominal ultrasound 09/29/2006. FINDINGS: Right Kidney: Renal measurements: 9.2 x 5.2 x 6.3 cm = volume: 150 mL. There is no hydronephrosis. Mildly increased echogenicity. Renal cortical thinning. There is a 2.4 x 1.8 x 2.3 cm cyst in the mid right kidney. There is a hypoechoic area in the inferior pole the right kidney measuring 1.4 x 1.2 x 1.0 cm, also  likely cyst, but not well characterized on this study. Cyst Left Kidney: Renal measurements: 10.1 x 5.8 x 6.0 cm = volume: 184 mL. There is no hydronephrosis. Mildly increased echogenicity. Renal cortical thinning. There are 2 hypoechoic areas in the  lower pole the left kidney which are likely cysts, but not well characterized on this study these both measure up to 1.2 cm. Bladder: Appears normal for degree of bladder distention. Other: None. IMPRESSION: 1. Increased renal echogenicity with renal cortical thinning suggestive of medical renal disease. No hydronephrosis. 2. Right renal cyst measuring 22.4 cm. There are additional bilateral hypoechoic areas in both kidneys, likely small cysts, but well characterized on this study. Electronically Signed   By: Ronney Asters M.D.   On: 05/06/2021 19:24   DG Shoulder Left Port  Result Date: 05/07/2021 CLINICAL DATA:  Previous shoulder dislocation. Increased pain this morning. EXAM: LEFT SHOULDER COMPARISON:  10/05/2009 FINDINGS: Left total shoulder prosthesis is well seated without periprosthetic fracture or lucency. Moderate degenerative changes of the acromioclavicular joint. Degenerative changes seen throughout the cervical spine. IMPRESSION: Uncomplicated left total shoulder prosthesis. Electronically Signed   By: Miachel Roux M.D.   On: 05/07/2021 08:48               LOS: 2 days   Aj Crunkleton  [facility]   Pager on [email or web address]. If 7PM-7AM, please contact night-coverage at [email or web address]     05/08/2021, 4:00 PM

## 2021-05-08 NOTE — Progress Notes (Signed)
Progress Note  Patient Name: Brandy [name] Date of Encounter: 05/08/2021  [facility] Cardiologist: None   Subjective   Denies any chest pain or dyspnea  Inpatient Medications    Scheduled Meds:  [MAR Hold] atorvastatin  40 mg Oral Daily   [MAR Hold] azithromycin  500 mg Oral Daily   [MAR Hold] DULoxetine  20 mg Oral BID   [MAR Hold] enoxaparin (LOVENOX) injection  40 mg Subcutaneous Q24H   [MAR Hold] gabapentin  300 mg Oral QHS   [MAR Hold] guaiFENesin  600 mg Oral BID   [MAR Hold] irbesartan  75 mg Oral Daily   And   [MAR Hold] hydrochlorothiazide  12.5 mg Oral Daily   [MAR Hold] sodium chloride flush  3 mL Intravenous Q12H   [MAR Hold] sodium chloride flush  3 mL Intravenous Q12H   Continuous Infusions:  sodium chloride 50 mL/hr at 05/08/21 1138   sodium chloride 1 mL/kg/hr (05/08/21 0903)   [MAR Hold] cefTRIAXone (ROCEPHIN)  IV Stopped (05/07/21 1650)   PRN Meds: [MAR Hold] acetaminophen **OR** [MAR Hold] acetaminophen, fentaNYL, Heparin (Porcine) in NaCl, heparin sodium (porcine), hydrALAZINE, iohexol, [MAR Hold] ipratropium-albuterol, labetalol, lidocaine (PF), midazolam, [MAR Hold] ondansetron **OR** [MAR Hold] ondansetron (ZOFRAN) IV, [MAR Hold] traMADol   Vital Signs    Vitals:   05/08/21 1119 05/08/21 1119 05/08/21 1140 05/08/21 1150  BP:  (!) 149/60 (!) 198/51 (!) 191/47  Pulse: 79 81 81 79  Resp: 16 16 18  (!) 24  Temp:      TempSrc:      SpO2: 95% 95% 96% 97%  Weight:      Height:        Intake/Output Summary (Last 24 hours) at 05/08/2021 1237 Last data filed at 05/08/2021 0630 Gross per 24 hour  Intake 100 ml  Output --  Net 100 ml   Last 3 Weights 05/08/2021 05/07/2021 05/05/2021  Weight (lbs) 135 lb 9.3 oz 134 lb 7.7 oz 133 lb  Weight (kg) 61.5 kg 61 kg 60.328 kg      Telemetry   NSR, brieg episode of SVT - Personally Reviewed  ECG    No new ECG - Personally Reviewed  Physical Exam   GEN: No acute distress.   Neck: No  JVD Cardiac: 3/6 systolic murmur, RRR Respiratory: Clear to auscultation bilaterally. GI: Soft, nontender, non-distended  MS: No edema; No deformity. Neuro:  Nonfocal  Psych: Normal affect   Labs    High Sensitivity Troponin:   Recent Labs  Lab 05/05/21 1532 05/05/21 1720 05/06/21 1113 05/06/21 1232  TROPONINIHS 54* 63* 31* 31*     Chemistry Recent Labs  Lab 05/05/21 1532 05/06/21 1113 05/07/21 0459  NA 137 137 138  K 4.3 3.8 3.6  CL 105 106 107  CO2 22 24 23   GLUCOSE 153* 119* 103*  BUN 26* 26* 19  CREATININE 1.29* 0.86 0.83  CALCIUM 8.9 8.4* 8.4*  PROT 6.6 6.2* 6.1*  ALBUMIN 3.1* 2.8* 2.7*  AST 17 14* 13*  ALT 12 11 12   ALKPHOS 86 81 78  BILITOT 0.6 0.4 0.3  GFRNONAA 40* >60 >60  ANIONGAP 10 7 8     Lipids No results for input(s): CHOL, TRIG, HDL, LABVLDL, LDLCALC, CHOLHDL in the last 168 hours.  Hematology Recent Labs  Lab 05/05/21 1532 05/06/21 1113 05/07/21 0459  WBC 16.0* 11.5* 8.7  RBC 3.16* 2.96* 3.12*  HGB 9.6* 9.0* 9.3*  HCT 29.7* 28.3* 29.9*  MCV 94.0 95.6 95.8  MCH 30.4  30.4 29.8  MCHC 32.3 31.8 31.1  RDW 13.9 14.0 13.9  PLT 262 224 234   Thyroid No results for input(s): TSH, FREET4 in the last 168 hours.  BNP Recent Labs  Lab 05/06/21 1113  BNP 143.4*    DDimer No results for input(s): DDIMER in the last 168 hours.   Radiology    CARDIAC CATHETERIZATION  Result Date: 05/08/2021   No angiographically apparent coronary artery disease.   Ao sat 94%, PA sat 63%, PA pressure 26/6, mean PA pressure 13 mmHg, mean pulmonary capillary wedge pressure 5 mmHg, cardiac output 5.4 L/min, cardiac index 3.39.   Severe right subclavian tortuosity.  If cardiac cath was needed in the future, would not use right radial approach.   Did not cross aortic valve. Continue with plans for TAVR work-up.   US RENAL  Result Date: 05/06/2021 CLINICAL DATA:  Acute kidney injury. EXAM: RENAL / URINARY TRACT ULTRASOUND COMPLETE COMPARISON:  Abdominal ultrasound  09/29/2006. FINDINGS: Right Kidney: Renal measurements: 9.2 x 5.2 x 6.3 cm = volume: 150 mL. There is no hydronephrosis. Mildly increased echogenicity. Renal cortical thinning. There is a 2.4 x 1.8 x 2.3 cm cyst in the mid right kidney. There is a hypoechoic area in the inferior pole the right kidney measuring 1.4 x 1.2 x 1.0 cm, also likely cyst, but not well characterized on this study. Cyst Left Kidney: Renal measurements: 10.1 x 5.8 x 6.0 cm = volume: 184 mL. There is no hydronephrosis. Mildly increased echogenicity. Renal cortical thinning. There are 2 hypoechoic areas in the lower pole the left kidney which are likely cysts, but not well characterized on this study these both measure up to 1.2 cm. Bladder: Appears normal for degree of bladder distention. Other: None. IMPRESSION: 1. Increased renal echogenicity with renal cortical thinning suggestive of medical renal disease. No hydronephrosis. 2. Right renal cyst measuring 22.4 cm. There are additional bilateral hypoechoic areas in both kidneys, likely small cysts, but well characterized on this study. Electronically Signed   By: Ronney Asters M.D.   On: 05/06/2021 19:24   DG Shoulder Left Port  Result Date: 05/07/2021 CLINICAL DATA:  Previous shoulder dislocation. Increased pain this morning. EXAM: LEFT SHOULDER COMPARISON:  10/05/2009 FINDINGS: Left total shoulder prosthesis is well seated without periprosthetic fracture or lucency. Moderate degenerative changes of the acromioclavicular joint. Degenerative changes seen throughout the cervical spine. IMPRESSION: Uncomplicated left total shoulder prosthesis. Electronically Signed   By: Miachel Roux M.D.   On: 05/07/2021 08:48    Cardiac Studies     Patient Profile     87 y.o. female with history of aortic stenosis, breast cancer, hypertension, hyperlipidemia who we are consulted for evaluation of syncope and aortic stenosis  Assessment & Plan    Fall/syncope: unclear if true syncope, per  description sounds like has a fall after standing up from the toilet.  Echo shows moderate to severe aortic stenosis.  Telemetry unremarkable, will plan monitor on discharge to rule out arrhythmia.  Aortic stenosis: Her echocardiogram shows moderate to severe aortic stenosis (mean gradient 30 mmHg, AVA 0.8 cm, DI 0.27).  Unclear if had true syncope as above, but is having exertional dyspnea, which suspect is coming from her aortic stenosis -RHC/LHC today showed normal coronary arteries, normal filling pressures (RA 3, RV 24/0, PA 26/6/13, PCWP 5, CI 3.4, PA sat 63%) -Recommend outpatient follow-up in valve clinic.  Scheduled to see Dr.Thukkani on 11/7 at 11:20   Acute hypoxic respiratory failure secondary to pneumonia -  Breathing has improved -Per primary team   HTN - BP intermittently elevated - Continue HCTZ/AVAPRO   HLD - Continue statin   For questions or updates, please contact [place] Please consult [email or web address] for contact info under        Signed, Donato Heinz, MD  05/08/2021, 12:37 PM

## 2021-05-08 NOTE — Interval H&P Note (Signed)
Cath Lab Visit (complete for each Cath Lab visit)  Clinical Evaluation Leading to the Procedure:   ACS: Yes.    Non-ACS:    Anginal Classification: CCS IV  Anti-ischemic medical therapy: Minimal Therapy (1 class of medications)  Non-Invasive Test Results: High-risk stress test findings: cardiac mortality >3%/year  Prior CABG: No previous CABG   Severe AS. Possible TAVR candidate.    History and Physical Interval Note:  05/08/2021 10:26 AM  Brandy [name]  has presented today for surgery, with the diagnosis of severe aortic stenosis.  The various methods of treatment have been discussed with the patient and family. After consideration of risks, benefits and other options for treatment, the patient has consented to  Procedure(s): RIGHT/LEFT HEART CATH AND CORONARY ANGIOGRAPHY (N/A) as a surgical intervention.  The patient's history has been reviewed, patient examined, no change in status, stable for surgery.  I have reviewed the patient's chart and labs.  Questions were answered to the patient's satisfaction.     Larae Grooms

## 2021-05-08 NOTE — Progress Notes (Signed)
  [facility] VALVE TEAM   Valve team contacted for referral for consultation to discuss possible TAVR. She has been scheduled to see Dr. Ali Lowe 05/12/21 at 11:20am. This has been placed in the patients chart with date, time, and location.   Kathyrn Drown NP-C Structural Heart Team  Pager: [phone]

## 2021-05-08 NOTE — Progress Notes (Signed)
TR Band Placement- right  Amount of air in band- 0  Time air was removed from band- 1357  Dressing placed- Gauze and Tegaderm  Level prior to band removal- 0  Level after removal-  0  Post Education- Yes   Comments-

## 2021-05-09 ENCOUNTER — Other Ambulatory Visit: Payer: Self-pay | Admitting: *Deleted

## 2021-05-09 ENCOUNTER — Encounter (HOSPITAL_COMMUNITY): Payer: Self-pay | Admitting: Interventional Cardiology

## 2021-05-09 ENCOUNTER — Ambulatory Visit: Payer: Medicare Other

## 2021-05-09 DIAGNOSIS — R55 Syncope and collapse: Secondary | ICD-10-CM

## 2021-05-09 LAB — POCT I-STAT EG7
Acid-Base Excess: 2 mmol/L (ref 0.0–2.0)
Acid-Base Excess: 2 mmol/L (ref 0.0–2.0)
Acid-Base Excess: 2 mmol/L (ref 0.0–2.0)
Bicarbonate: 25.8 mmol/L (ref 20.0–28.0)
Bicarbonate: 26.7 mmol/L (ref 20.0–28.0)
Bicarbonate: 26.8 mmol/L (ref 20.0–28.0)
Calcium, Ion: 1.19 mmol/L (ref 1.15–1.40)
Calcium, Ion: 1.23 mmol/L (ref 1.15–1.40)
Calcium, Ion: 1.23 mmol/L (ref 1.15–1.40)
HCT: 25 % — ABNORMAL LOW (ref 36.0–46.0)
HCT: 26 % — ABNORMAL LOW (ref 36.0–46.0)
HCT: 26 % — ABNORMAL LOW (ref 36.0–46.0)
Hemoglobin: 8.5 g/dL — ABNORMAL LOW (ref 12.0–15.0)
Hemoglobin: 8.8 g/dL — ABNORMAL LOW (ref 12.0–15.0)
Hemoglobin: 8.8 g/dL — ABNORMAL LOW (ref 12.0–15.0)
O2 Saturation: 63 %
O2 Saturation: 63 %
O2 Saturation: 94 %
Potassium: 3.8 mmol/L (ref 3.5–5.1)
Potassium: 3.8 mmol/L (ref 3.5–5.1)
Potassium: 3.8 mmol/L (ref 3.5–5.1)
Sodium: 140 mmol/L (ref 135–145)
Sodium: 141 mmol/L (ref 135–145)
Sodium: 141 mmol/L (ref 135–145)
TCO2: 27 mmol/L (ref 22–32)
TCO2: 28 mmol/L (ref 22–32)
TCO2: 28 mmol/L (ref 22–32)
pCO2, Ven: 38.4 mmHg — ABNORMAL LOW (ref 44.0–60.0)
pCO2, Ven: 43.4 mmHg — ABNORMAL LOW (ref 44.0–60.0)
pCO2, Ven: 43.7 mmHg — ABNORMAL LOW (ref 44.0–60.0)
pH, Ven: 7.396 (ref 7.250–7.430)
pH, Ven: 7.397 (ref 7.250–7.430)
pH, Ven: 7.436 — ABNORMAL HIGH (ref 7.250–7.430)
pO2, Ven: 33 mmHg (ref 32.0–45.0)
pO2, Ven: 33 mmHg (ref 32.0–45.0)
pO2, Ven: 68 mmHg — ABNORMAL HIGH (ref 32.0–45.0)

## 2021-05-09 LAB — BASIC METABOLIC PANEL
Anion gap: 10 (ref 5–15)
BUN: 14 mg/dL (ref 8–23)
CO2: 24 mmol/L (ref 22–32)
Calcium: 8.3 mg/dL — ABNORMAL LOW (ref 8.9–10.3)
Chloride: 105 mmol/L (ref 98–111)
Creatinine, Ser: 0.69 mg/dL (ref 0.44–1.00)
GFR, Estimated: >60 mL/min (ref >60)
Glucose, Bld: 104 mg/dL — ABNORMAL HIGH (ref 70–99)
Potassium: 3.3 mmol/L — ABNORMAL LOW (ref 3.5–5.1)
Sodium: 139 mmol/L (ref 135–145)

## 2021-05-09 LAB — GLUCOSE, CAPILLARY
Glucose-Capillary: 110 mg/dL — ABNORMAL HIGH (ref 70–99)
Glucose-Capillary: 128 mg/dL — ABNORMAL HIGH (ref 70–99)

## 2021-05-09 MED ORDER — AMOXICILLIN-POT CLAVULANATE 875-125 MG PO TABS: 1.0000 | ORAL_TABLET | Freq: Two times a day (BID) | ORAL | 0 refills | Status: AC

## 2021-05-09 MED ORDER — POTASSIUM CHLORIDE CRYS ER 20 MEQ PO TBCR
40.0000 meq | EXTENDED_RELEASE_TABLET | Freq: Once | ORAL | Status: AC
Start: 2021-05-09 — End: 2021-05-09
  Administered 2021-05-09: 40 meq via ORAL
  Filled 2021-05-09: qty 2

## 2021-05-09 MED ORDER — OMEPRAZOLE 40 MG PO CPDR: 40.0000 mg | DELAYED_RELEASE_CAPSULE | Freq: Every day | ORAL | Status: DC | PRN

## 2021-05-09 MED ORDER — FERROUS SULFATE 325 (65 FE) MG PO TABS: 325.0000 mg | ORAL_TABLET | Freq: Every day | ORAL | Status: AC

## 2021-05-09 MED FILL — Verapamil HCl IV Soln 2.5 MG/ML: INTRAVENOUS | Qty: 2 | Status: AC

## 2021-05-09 NOTE — Plan of Care (Signed)

## 2021-05-09 NOTE — Care Management Important Message (Signed)
Important Message  Patient Details IM Letter placed in Patients room. Name: Brandy [name] MRN: [id number] Date of Birth: [birth year]   Medicare Important Message Given:  Yes     Kerin Salen 05/09/2021, 12:58 PM

## 2021-05-09 NOTE — Progress Notes (Signed)
Pt is injury free, afebrile, alert, and oriented X 4. Vital signs were within the baseline during this shift. She denies chest pain, SOB, nausea, vomiting, dizziness, signs or symptoms of bleeding or infection or acute changes during this shift. We will continue to monitor and work toward achieving the care plan goals.

## 2021-05-09 NOTE — Plan of Care (Signed)

## 2021-05-09 NOTE — TOC Transition Note (Signed)
Transition of Care [facility]) - CM/SW Discharge Note   Patient Details  Name: STACIE TEMPLIN MRN: [id number] Date of Birth: 11-Dec-1932  Transition of [facility]) CM/SW Contact:  Trish Mage, LCSW Phone Number: 05/09/2021, 10:35 AM   Clinical Narrative:   Patient seen in follow up to PT, OT recommendation of [facility] services. Ms Follette lives in [place], granddaughter stays with her but works during the day.  Family gives her rides. She has walker, cane, rollator, and her shower has a bench and grab handles.  She feels comfortable going home without any additional DME, is open to [facility] PT, OT and specifically asked for aide. I contacted Cindie with [facility] who agrees to provide the above services.  No further needs identified. TOC sign off.    Final next level of care: [place] Barriers to Discharge: No Barriers Identified   Patient Goals and CMS Choice        Discharge Placement                       Discharge Plan and Services                                     Social Determinants of Health (SDOH) Interventions     Readmission Risk Interventions No flowsheet data found.

## 2021-05-09 NOTE — Progress Notes (Unsigned)
Enrolled patient for a 14 day Zio XT  monitor to be mailed to patients home  °

## 2021-05-09 NOTE — Progress Notes (Addendum)
OK for discharge from cardiology standpoint.  Remains unclear if fall was true syncopal episode.  Telemetry unremarkable, will plan Zio patch x2 weeks on discharge to rule out arrhythmia.  Has follow-up in valve clinic for TAVR evaluation on 11/7.    Donato Heinz, MD

## 2021-05-09 NOTE — Discharge Summary (Addendum)
Physician Discharge Summary  Brandy [name] NLG:[id number] DOB: [birth year] DOA: 05/05/2021  PCP: Kathyrn Lass, MD  Admit date: 05/05/2021 Discharge date: 05/09/2021  Discharge disposition: Home with home health therapy   Recommendations for Outpatient Follow-Up:   Follow-up with PCP in 1 week. Follow-up at the valve clinic 05/12/2021 as scheduled   Discharge Diagnosis:   Active Problems:   Aortic valve stenosis   Acute on chronic respiratory failure with hypoxemia (HCC)   PNA (pneumonia)   Syncope and collapse    Discharge Condition: Stable.  Diet recommendation:  Diet Order             Diet - low sodium heart healthy           Diet Heart Room service appropriate? Yes; Fluid consistency: Thin  Diet effective now                     Code Status: DNR     Hospital Course:   Ms. Brandy [name] is a 87 y.o. female with past medical history significant for dementia, hypertension, hyperlipidemia, aortic stenosis, who presented to the hospital because of fall at home and suspected syncope.   She was admitted to the hospital for acute hypoxemic respiratory failure and community-acquired pneumonia.  She was treated with empiric IV antibiotics.  Cardiology was consulted because of aortic valve stenosis and suspected syncope.  She underwent right and left heart cath but there was no evidence of CAD.  Her condition has improved and she is deemed stable for discharge to home today.  Cardiologist recommended outpatient monitoring for arrhythmia with Zio patch for 2 weeks.  Discharge plan was discussed with the patient who verbalized understanding.  She said she lives with her granddaughter. Mr. Roque Lias, social worker, was at the bedside during this encounter.      Medical Consultants:   Cardiologist   Discharge Exam:    Vitals:   05/08/21 1722 05/08/21 1900 05/09/21 0447 05/09/21 0448  BP: (!) 146/61 (!) 158/59 (!) 146/59   Pulse: 69 72 81   Resp:  18 18    Temp:  99.1 F (37.3 C) 98.9 F (37.2 C)   TempSrc:  Oral Oral   SpO2:  97% 98%   Weight:    61.6 kg  Height:         GEN: NAD SKIN: Warm and dry EYES: No pallor or icterus ENT: MMM CV: RRR PULM: CTA B ABD: soft, ND, NT, +BS CNS: AAO x 3, non focal EXT: No edema or tenderness   The results of significant diagnostics from this hospitalization (including imaging, microbiology, ancillary and laboratory) are listed below for reference.     Procedures and Diagnostic Studies:   DG Chest 2 View  Result Date: 05/05/2021 CLINICAL DATA:  Dyspnea.  Fall.  Cough. EXAM: CHEST - 2 VIEW COMPARISON:  Two-view chest x-ray 11/19/2020 FINDINGS: The heart is enlarged. Atherosclerotic changes are noted at the aortic arch. Changes of COPD are present. Medial right lower lobe airspace disease is present. The upper lung fields are clear. Left shoulder arthroplasty present. Advanced degenerative changes again noted in the right shoulder. IMPRESSION: 1. Medial right lower lobe airspace disease concerning for pneumonia. 2. Stable cardiomegaly without failure. 3. Changes of COPD. Electronically Signed   By: San Morelle M.D.   On: 05/05/2021 16:06   CT Head Wo Contrast  Result Date: 05/05/2021 CLINICAL DATA:  Head trauma, mod-severe EXAM: CT HEAD WITHOUT CONTRAST TECHNIQUE: Contiguous axial  images were obtained from the base of the skull through the vertex without intravenous contrast. COMPARISON:  11/19/2020 FINDINGS: Brain: There is no acute intracranial hemorrhage, mass effect, or edema. No new loss of gray-white differentiation. There is no extra-axial fluid collection. Prominence of the ventricles and sulci reflects stable parenchymal volume loss. Confluent areas of low-density in the supratentorial white matter are nonspecific but probably reflect stable moderate to marked chronic microvascular ischemic changes. Small chronic right occipital infarct. Vascular: There is atherosclerotic  calcification at the skull base. Skull: Calvarium is unremarkable. Sinuses/Orbits: No acute finding. Other: None. IMPRESSION: No evidence of acute intracranial injury. Electronically Signed   By: Macy Mis M.D.   On: 05/05/2021 16:05   CT Cervical Spine Wo Contrast  Result Date: 05/05/2021 CLINICAL DATA:  Neck trauma (Age >= 65y) EXAM: CT CERVICAL SPINE WITHOUT CONTRAST TECHNIQUE: Multidetector CT imaging of the cervical spine was performed without intravenous contrast. Multiplanar CT image reconstructions were also generated. COMPARISON:  None. FINDINGS: Alignment: Mild multilevel degenerative listhesis. Skull base and vertebrae: Diffuse degenerative endplate irregularity. No acute fracture. Soft tissues and spinal canal: No prevertebral fluid or swelling. No visible canal hematoma. Disc levels: Multilevel degenerative changes are present including disc space narrowing, endplate osteophytes, and facet and uncovertebral hypertrophy. Upper chest: No apical lung mass. Other: Calcified plaque at the common carotid bifurcations. Retropharyngeal course carotids. IMPRESSION: No acute cervical spine fracture. Electronically Signed   By: Macy Mis M.D.   On: 05/05/2021 16:23   US RENAL  Result Date: 05/06/2021 CLINICAL DATA:  Acute kidney injury. EXAM: RENAL / URINARY TRACT ULTRASOUND COMPLETE COMPARISON:  Abdominal ultrasound 09/29/2006. FINDINGS: Right Kidney: Renal measurements: 9.2 x 5.2 x 6.3 cm = volume: 150 mL. There is no hydronephrosis. Mildly increased echogenicity. Renal cortical thinning. There is a 2.4 x 1.8 x 2.3 cm cyst in the mid right kidney. There is a hypoechoic area in the inferior pole the right kidney measuring 1.4 x 1.2 x 1.0 cm, also likely cyst, but not well characterized on this study. Cyst Left Kidney: Renal measurements: 10.1 x 5.8 x 6.0 cm = volume: 184 mL. There is no hydronephrosis. Mildly increased echogenicity. Renal cortical thinning. There are 2 hypoechoic areas in the  lower pole the left kidney which are likely cysts, but not well characterized on this study these both measure up to 1.2 cm. Bladder: Appears normal for degree of bladder distention. Other: None. IMPRESSION: 1. Increased renal echogenicity with renal cortical thinning suggestive of medical renal disease. No hydronephrosis. 2. Right renal cyst measuring 22.4 cm. There are additional bilateral hypoechoic areas in both kidneys, likely small cysts, but well characterized on this study. Electronically Signed   By: Ronney Asters M.D.   On: 05/06/2021 19:24   ECHOCARDIOGRAM COMPLETE  Result Date: 05/06/2021    ECHOCARDIOGRAM REPORT   Patient Name:   TASFIA VASSEUR [facility] Date of Exam: 05/06/2021 Medical Rec #:  [id number]       Height:       61.0 in Accession #:    [id number]      Weight:       133.0 lb Date of Birth:  Nov 15, 1932        BSA:          1.588 m Patient Age:    69 years        BP:           143/61 mmHg Patient Gender: F  HR:           87 bpm. Exam Location:  Inpatient Procedure: 2D Echo, Cardiac Doppler and Color Doppler Indications:    Syncope R55  History:        Patient has prior history of Echocardiogram examinations, most                 recent 09/08/2019. Aortic Valve Disease and Mitral Valve Disease;                 Risk Factors:Hypertension and Dyslipidemia. GERD. Past history                 of breast cancer.  Sonographer:    Darlina Sicilian RDCS Referring Phys: [id number] Jonnie Finner  Sonographer Comments: PEDOF attempted. IMPRESSIONS  1. Left ventricular ejection fraction, by estimation, is 70 to 75%. The left ventricle has hyperdynamic function. The left ventricle has no regional wall motion abnormalities. There is mild left ventricular hypertrophy. Left ventricular diastolic parameters are consistent with Grade I diastolic dysfunction (impaired relaxation). Elevated left atrial pressure.  2. Right ventricular systolic function is normal. The right ventricular size is normal.  3. Mild mitral  valve regurgitation. Moderate to severe mitral annular calcification.  4. AV is thickened, calcified with restricted motion. Peak and mean gradients through the valve are 64 and 38 mm Hg respectively. Dimensionless index is 0.27 Consistent with moderate to severe AS> Compared to echo report from 2021, mean gradient is increased (70mm to 38 mm ) . The aortic valve is abnormal. Aortic valve regurgitation is not visualized.  5. The inferior vena cava is normal in size with greater than 50% respiratory variability, suggesting right atrial pressure of 3 mmHg. FINDINGS  Left Ventricle: Left ventricular ejection fraction, by estimation, is 70 to 75%. The left ventricle has hyperdynamic function. The left ventricle has no regional wall motion abnormalities. The left ventricular internal cavity size was normal in size. There is mild left ventricular hypertrophy. Left ventricular diastolic parameters are consistent with Grade I diastolic dysfunction (impaired relaxation). Elevated left atrial pressure. Right Ventricle: The right ventricular size is normal. Right vetricular wall thickness was not assessed. Right ventricular systolic function is normal. Left Atrium: Left atrial size was normal in size. Right Atrium: Right atrial size was normal in size. Pericardium: There is no evidence of pericardial effusion. Mitral Valve: There is moderate thickening of the mitral valve leaflet(s). Moderate to severe mitral annular calcification. Mild mitral valve regurgitation. MV peak gradient, 10.7 mmHg. The mean mitral valve gradient is 4.0 mmHg. Tricuspid Valve: The tricuspid valve is normal in structure. Tricuspid valve regurgitation is trivial. Aortic Valve: AV is thickened, calcified with restricted motion. Peak and mean gradients through the valve are 64 and 38 mm Hg respectively. Dimensionless index is 0.27 Consistent with moderate to severe AS> Compared to echo report from 2021, mean gradient is increased (82mm to 38 mm ). The  aortic valve is abnormal. Aortic valve regurgitation is not visualized. Aortic valve mean gradient measures 34.5 mmHg. Aortic valve peak gradient measures 56.4 mmHg. Aortic valve area, by VTI measures 0.76 cm. Pulmonic Valve: The pulmonic valve was not well visualized. Pulmonic valve regurgitation is not visualized. Aorta: The aortic root and ascending aorta are structurally normal, with no evidence of dilitation. Venous: The inferior vena cava is normal in size with greater than 50% respiratory variability, suggesting right atrial pressure of 3 mmHg. IAS/Shunts: No atrial level shunt detected by color flow Doppler.  LEFT VENTRICLE PLAX  2D LVIDd:         4.10 cm   Diastology LVIDs:         2.70 cm   LV e' medial:    3.37 cm/s LV PW:         1.20 cm   LV E/e' medial:  31.2 LV IVS:        1.20 cm   LV e' lateral:   5.55 cm/s LVOT diam:     1.90 cm   LV E/e' lateral: 18.9 LV SV:         61 LV SV Index:   38 LVOT Area:     2.84 cm  RIGHT VENTRICLE RV S prime:     12.40 cm/s TAPSE (M-mode): 1.6 cm LEFT ATRIUM             Index        RIGHT ATRIUM           Index LA diam:        3.20 cm 2.02 cm/m   RA Area:     12.90 cm LA Vol (A2C):   42.5 ml 26.76 ml/m  RA Volume:   24.50 ml  15.43 ml/m LA Vol (A4C):   28.6 ml 18.01 ml/m LA Biplane Vol: 34.9 ml 21.98 ml/m  AORTIC VALVE AV Area (Vmax):    0.88 cm AV Area (Vmean):   0.78 cm AV Area (VTI):     0.76 cm AV Vmax:           375.50 cm/s AV Vmean:          279.750 cm/s AV VTI:            0.798 m AV Peak Grad:      56.4 mmHg AV Mean Grad:      34.5 mmHg LVOT Vmax:         116.00 cm/s LVOT Vmean:        76.700 cm/s LVOT VTI:          0.214 m LVOT/AV VTI ratio: 0.27  AORTA Ao Root diam: 3.60 cm MITRAL VALVE MV Area (PHT): 1.68 cm     SHUNTS MV Area VTI:   1.56 cm     Systemic VTI:  0.21 m MV Peak grad:  10.7 mmHg    Systemic Diam: 1.90 cm MV Mean grad:  4.0 mmHg MV Vmax:       1.64 m/s MV Vmean:      91.7 cm/s MV Decel Time: 451 msec MV E velocity: 105.00 cm/s MV A  velocity: 170.00 cm/s MV E/A ratio:  0.62 Dorris Carnes MD Electronically signed by Dorris Carnes MD Signature Date/Time: 05/06/2021/5:32:02 PM    Final      Labs:   Basic Metabolic Panel: Recent Labs  Lab 05/05/21 1532 05/06/21 1113 05/07/21 0459 05/08/21 1727 05/09/21 0443  NA 137 137 138 138 139  K 4.3 3.8 3.6 4.0 3.3*  CL 105 106 107 106 105  CO2 22 24 23 25 24   GLUCOSE 153* 119* 103* 120* 104*  BUN 26* 26* 19 14 14   CREATININE 1.29* 0.86 0.83 0.86 0.69  CALCIUM 8.9 8.4* 8.4* 8.4* 8.3*   GFR Estimated Creatinine Clearance: 42.5 mL/min (by C-G formula based on SCr of 0.69 mg/dL). Liver Function Tests: Recent Labs  Lab 05/05/21 1532 05/06/21 1113 05/07/21 0459  AST 17 14* 13*  ALT 12 11 12   ALKPHOS 86 81 78  BILITOT 0.6 0.4 0.3  PROT 6.6 6.2* 6.1*  ALBUMIN 3.1* 2.8* 2.7*   No results for input(s): LIPASE, AMYLASE in the last 168 hours. No results for input(s): AMMONIA in the last 168 hours. Coagulation profile No results for input(s): INR, PROTIME in the last 168 hours.  CBC: Recent Labs  Lab 05/05/21 1532 05/06/21 1113 05/07/21 0459 05/08/21 1727  WBC 16.0* 11.5* 8.7 7.5  NEUTROABS 13.5* 9.0*  --   --   HGB 9.6* 9.0* 9.3* 8.8*  HCT 29.7* 28.3* 29.9* 27.7*  MCV 94.0 95.6 95.8 94.5  PLT 262 224 234 278   Cardiac Enzymes: No results for input(s): CKTOTAL, CKMB, CKMBINDEX, TROPONINI in the last 168 hours. BNP: Invalid input(s): POCBNP CBG: Recent Labs  Lab 05/07/21 0505 05/08/21 0528 05/09/21 0443 05/09/21 0846  GLUCAP 123* 98 110* 128*   D-Dimer No results for input(s): DDIMER in the last 72 hours. Hgb A1c No results for input(s): HGBA1C in the last 72 hours. Lipid Profile No results for input(s): CHOL, HDL, LDLCALC, TRIG, CHOLHDL, LDLDIRECT in the last 72 hours. Thyroid function studies No results for input(s): TSH, T4TOTAL, T3FREE, THYROIDAB in the last 72 hours.  Invalid input(s): FREET3 Anemia work up No results for input(s): VITAMINB12,  FOLATE, FERRITIN, TIBC, IRON, RETICCTPCT in the last 72 hours. Microbiology Recent Results (from the past 240 hour(s))  Resp Panel by RT-PCR (Flu A&B, Covid) Nasopharyngeal Swab     Status: None   Collection Time: 05/05/21  3:32 PM   Specimen: Nasopharyngeal Swab; Nasopharyngeal(NP) swabs in vial transport medium  Result Value Ref Range Status   SARS Coronavirus 2 by RT PCR NEGATIVE NEGATIVE Final    Comment: (NOTE) SARS-CoV-2 target nucleic acids are NOT DETECTED.  The SARS-CoV-2 RNA is generally detectable in upper respiratory specimens during the acute phase of infection. The lowest concentration of SARS-CoV-2 viral copies this assay can detect is 138 copies/mL. A negative result does not preclude SARS-Cov-2 infection and should not be used as the sole basis for treatment or other patient management decisions. A negative result may occur with  improper specimen collection/handling, submission of specimen other than nasopharyngeal swab, presence of viral mutation(s) within the areas targeted by this assay, and inadequate number of viral copies(<138 copies/mL). A negative result must be combined with clinical observations, patient history, and epidemiological information. The expected result is Negative.  Fact Sheet for Patients:  EntrepreneurPulse.com.au  Fact Sheet for Healthcare Providers:  IncredibleEmployment.be  This test is no t yet approved or cleared by the [place] FDA and  has been authorized for detection and/or diagnosis of SARS-CoV-2 by FDA under an Emergency Use Authorization (EUA). This EUA will remain  in effect (meaning this test can be used) for the duration of the COVID-19 declaration under Section 564(b)(1) of the Act, 21 U.S.C.section 360bbb-3(b)(1), unless the authorization is terminated  or revoked sooner.       Influenza A by PCR NEGATIVE NEGATIVE Final   Influenza B by PCR NEGATIVE NEGATIVE Final    Comment:  (NOTE) The Xpert Xpress SARS-CoV-2/FLU/RSV plus assay is intended as an aid in the diagnosis of influenza from Nasopharyngeal swab specimens and should not be used as a sole basis for treatment. Nasal washings and aspirates are unacceptable for Xpert Xpress SARS-CoV-2/FLU/RSV testing.  Fact Sheet for Patients: EntrepreneurPulse.com.au  Fact Sheet for Healthcare Providers: IncredibleEmployment.be  This test is not yet approved or cleared by the [place] FDA and has been authorized for detection and/or diagnosis of SARS-CoV-2 by FDA under an Emergency Use Authorization (EUA). This EUA  will remain in effect (meaning this test can be used) for the duration of the COVID-19 declaration under Section 564(b)(1) of the Act, 21 U.S.C. section 360bbb-3(b)(1), unless the authorization is terminated or revoked.  Performed at [facility], [place] [address], [place], [place] [address]      Discharge Instructions:   Discharge Instructions     Diet - low sodium heart healthy   Complete by: As directed    Discharge instructions   Complete by: As directed    Go to emergency room or PCP's office on 05/12/2021 to remove staples on scalp   Discharge wound care:   Complete by: As directed    Go to emergency room or PCP's office on 05/12/2021 to remove staples on scalp   Face-to-face encounter (required for Medicare/Medicaid patients)   Complete by: As directed    I Macaulay Reicher certify that this patient is under my care and that I, or a nurse practitioner or physician's assistant working with me, had a face-to-face encounter that meets the physician face-to-face encounter requirements with this patient on 05/09/2021. The encounter with the patient was in whole, or in part for the following medical condition(s) which is the primary reason for home health care (List medical condition): Debility, pneumonia, aortic stenosis   The encounter with the patient was  in whole, or in part, for the following medical condition, which is the primary reason for home health care: Debility, pneumonia, aortic stenosis   I certify that, based on my findings, the following services are medically necessary home health services: Physical therapy   Reason for Medically Necessary Home Health Services: Therapy- Personnel officer, Public librarian   My clinical findings support the need for the above services: Unsafe ambulation due to balance issues   Further, I certify that my clinical findings support that this patient is homebound due to: Unsafe ambulation due to balance issues   Home Health   Complete by: As directed    To provide the following care/treatments:  PT Home Health Aide OT     Increase activity slowly   Complete by: As directed       Allergies as of 05/09/2021   No Known Allergies      Medication List     TAKE these medications    acetaminophen 500 MG tablet Commonly known as: TYLENOL Take 1,000 mg by mouth every 6 (six) hours as needed (pain).   alendronate 70 MG tablet Commonly known as: FOSAMAX Take 70 mg by mouth once a week. sunday   amoxicillin-clavulanate 875-125 MG tablet Commonly known as: Augmentin Take 1 tablet by mouth 2 (two) times daily for 3 days.   atorvastatin 40 MG tablet Commonly known as: LIPITOR Take 40 mg by mouth daily.   DULoxetine 20 MG capsule Commonly known as: CYMBALTA Take 20 mg by mouth 2 (two) times daily.   ferrous sulfate 325 (65 FE) MG tablet Take 1 tablet (325 mg total) by mouth daily.   gabapentin 300 MG capsule Commonly known as: NEURONTIN TAKE 1 CAPSULE BY MOUTH EVERYDAY AT BEDTIME What changed: See the new instructions.   multivitamin with minerals Tabs tablet Take 1 tablet by mouth daily.   omeprazole 40 MG capsule Commonly known as: PRILOSEC Take 1 capsule (40 mg total) by mouth daily as needed (acid reflux).   traMADol 50 MG tablet Commonly known as:  ULTRAM Take 2 tablets (100 mg total) by mouth 4 (four) times daily.   valsartan-hydrochlorothiazide 80-12.5  MG tablet Commonly known as: DIOVAN-HCT Take 1 tablet by mouth daily.   VITAMIN B-12 PO Take 1 tablet by mouth daily.   Vitamin D (Ergocalciferol) 1.25 MG (50000 UNIT) Caps capsule Commonly known as: DRISDOL Take 50,000 Units by mouth every 7 (seven) days. Sunday               Discharge Care Instructions  (From admission, onward)           Start     Ordered   05/09/21 0000  Discharge wound care:       Comments: Go to emergency room or PCP's office on 05/12/2021 to remove staples on scalp   05/09/21 1005            Follow-up Information     Early Osmond, MD. Go on 05/12/2021.   Specialty: Cardiology Why: at 11:30am. Please arrive to your appointment at 11:15am. Contact information: [place] [address] [phone]         Care, [facility] Follow up.   Specialty: Home Health Services Why: They will call you about setting up a time to come out to work with you Contact information: [employer] [place] [employer] [place] [address] [phone]                   If you experience worsening of your admission symptoms, develop shortness of breath, life threatening emergency, suicidal or homicidal thoughts you must seek medical attention immediately by calling 911 or calling your MD immediately  if symptoms less severe.   You must read complete instructions/literature along with all the possible adverse reactions/side effects for all the medicines you take and that have been prescribed to you. Take any new medicines after you have completely understood and accept all the possible adverse reactions/side effects.    Please note   You were cared for by a hospitalist during your hospital stay. If you have any questions about your discharge medications or the care you received while you were in the hospital after  you are discharged, you can call the unit and asked to speak with the hospitalist on call if the hospitalist that took care of you is not available. Once you are discharged, your primary care physician will handle any further medical issues. Please note that NO REFILLS for any discharge medications will be authorized once you are discharged, as it is imperative that you return to your primary care physician (or establish a relationship with a primary care physician if you do not have one) for your aftercare needs so that they can reassess your need for medications and monitor your lab values.       Time coordinating discharge: 34 minutes  Signed:  Jeven Topper  [facility] 05/09/2021, 12:03 PM   Pager on [email or web address]. If 7PM-7AM, please contact night-coverage at [email or web address]

## 2021-05-10 ENCOUNTER — Other Ambulatory Visit: Payer: Self-pay | Admitting: Nurse Practitioner

## 2021-05-12 ENCOUNTER — Other Ambulatory Visit: Payer: Self-pay

## 2021-05-12 ENCOUNTER — Encounter: Payer: Self-pay | Admitting: Physician Assistant

## 2021-05-12 ENCOUNTER — Other Ambulatory Visit: Payer: Self-pay | Admitting: Physician Assistant

## 2021-05-12 ENCOUNTER — Ambulatory Visit: Payer: Medicare Other | Admitting: Internal Medicine

## 2021-05-12 ENCOUNTER — Encounter: Payer: Self-pay | Admitting: Internal Medicine

## 2021-05-12 VITALS — BP 142/78 | HR 96 | Ht 61.0 in | Wt 129.4 lb

## 2021-05-12 DIAGNOSIS — M5030 Other cervical disc degeneration, unspecified cervical region: Secondary | ICD-10-CM | POA: Diagnosis not present

## 2021-05-12 DIAGNOSIS — D649 Anemia, unspecified: Secondary | ICD-10-CM | POA: Diagnosis not present

## 2021-05-12 DIAGNOSIS — I350 Nonrheumatic aortic (valve) stenosis: Secondary | ICD-10-CM | POA: Diagnosis not present

## 2021-05-12 DIAGNOSIS — E785 Hyperlipidemia, unspecified: Secondary | ICD-10-CM | POA: Diagnosis not present

## 2021-05-12 DIAGNOSIS — G2581 Restless legs syndrome: Secondary | ICD-10-CM | POA: Diagnosis not present

## 2021-05-12 DIAGNOSIS — I119 Hypertensive heart disease without heart failure: Secondary | ICD-10-CM | POA: Diagnosis not present

## 2021-05-12 DIAGNOSIS — F32A Depression, unspecified: Secondary | ICD-10-CM | POA: Diagnosis not present

## 2021-05-12 DIAGNOSIS — I700 Atherosclerosis of aorta: Secondary | ICD-10-CM | POA: Diagnosis not present

## 2021-05-12 DIAGNOSIS — Z8500 Personal history of malignant neoplasm of unspecified digestive organ: Secondary | ICD-10-CM | POA: Diagnosis not present

## 2021-05-12 DIAGNOSIS — M19011 Primary osteoarthritis, right shoulder: Secondary | ICD-10-CM | POA: Diagnosis not present

## 2021-05-12 DIAGNOSIS — J440 Chronic obstructive pulmonary disease with acute lower respiratory infection: Secondary | ICD-10-CM | POA: Diagnosis not present

## 2021-05-12 DIAGNOSIS — R1310 Dysphagia, unspecified: Secondary | ICD-10-CM | POA: Diagnosis not present

## 2021-05-12 DIAGNOSIS — Z96612 Presence of left artificial shoulder joint: Secondary | ICD-10-CM | POA: Diagnosis not present

## 2021-05-12 DIAGNOSIS — Z9181 History of falling: Secondary | ICD-10-CM | POA: Diagnosis not present

## 2021-05-12 DIAGNOSIS — M79602 Pain in left arm: Secondary | ICD-10-CM | POA: Diagnosis not present

## 2021-05-12 DIAGNOSIS — K219 Gastro-esophageal reflux disease without esophagitis: Secondary | ICD-10-CM | POA: Diagnosis not present

## 2021-05-12 DIAGNOSIS — G4700 Insomnia, unspecified: Secondary | ICD-10-CM | POA: Diagnosis not present

## 2021-05-12 DIAGNOSIS — Z96641 Presence of right artificial hip joint: Secondary | ICD-10-CM | POA: Diagnosis not present

## 2021-05-12 MED ORDER — METOPROLOL TARTRATE 50 MG PO TABS: 50.0000 mg | ORAL_TABLET | Freq: Once | ORAL | 0 refills | Status: DC

## 2021-05-12 NOTE — Progress Notes (Signed)
Pre Surgical Assessment: 5 M Walk Test  76M=16.38ft  5 Meter Walk Test- trial 1: 14.2 seconds 5 Meter Walk Test- trial 1: 12.52 seconds 5 Meter Walk Test- trial 1: 13.21seconds 5 Meter Walk Test Average: 13.31 seconds

## 2021-05-12 NOTE — Assessment & Plan Note (Addendum)
I had a long conversation with the patient and her daughter regarding her aortic stenosis.  She is relatively elderly and is likely best served by transcatheter valve replacement.  We will obtain a CT scan.  The patient would like to speak with her family regarding the procedure.  She understands that her aortic stenosis is progressive and will likely continue to grow worse and her to feel worse if an intervention is not performed.  Certainly would be reasonable to treat her medically if she did not want any invasive procedures but after our meeting it seems that she would like to consider this further and seems relatively interested.  She is already had her cardiac catheterization and we will have her see cardiothoracic surgery for surgical opinion.

## 2021-05-12 NOTE — Progress Notes (Signed)
Patient ID: Brandy [name] MRN: [id number] DOB/AGE: 1932-12-26 87 y.o.  Primary Care Physician:Miller, Lattie Haw, MD Primary Cardiologist: Gayla Doss, MD   PROBLEM LIST: 1.  Moderate to severe aortic stenosis with aortic valve area of 0.76 cm and mean gradient 30 mmHg with a normal ejection fraction; no conduction abnormalities 2.  Hypertension 3.  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  Frailty and ambulates with walker 5.  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 year old female with the indicated medical issues referred by Dr. Gardiner Rhyme for recommendations regarding the patient's aortic valvular disease.  The patient had been admitted recently due to syncope.  An echocardiogram demonstrated progression of aortic valvular disease.  She underwent cardiac catheterization which demonstrated no obstructive coronary artery disease.  She was medically stabilized and ultimately discharged home.  She is here today with her daughter.  They report that the patient has been doing relatively well at home.  She is relatively sedentary.  She walks with a walker.  She does get occasionally short of breath when she exerts herself especially when she travels to the bathroom.  She does get dizzy when she goes from sitting to standing quickly.  Fortunately she has had no recurrent syncope.  She denies any exertional angina.  She denies any palpitations, paroxysmal nocturnal dyspnea or orthopnea.  She does sleep on 2 pillows but mostly due to comfort and not breathing issues.  She has had no severe bleeding episodes.  Due to her musculoskeletal pain and this is due to of the fall she sustained as well as a prior left shoulder surgery she is limited in terms of her ability to do physical activity.  She wishes that her breathing were better and hopes to live longer.  Past Medical History:  Diagnosis  Date   Anemia    Arthritis    shoulders   Asthma    mild, no inhalers used   Breast cancer ([place]) 12/18/11   left breast masectomy=metastatic ca in (1/1) lymph node ,invasive ductal ca,2 foci,,dcis,lymph ovascular invasion identified,surgical resection margins neg for ca,additional tissue=benign skin and subcutaneous tissue   Bronchitis    hx of;last time >51yrago   Depression    takes Celexa daily   Difficult intubation    Dysphagia    Full dentures    Gall stones 2015   GERD (gastroesophageal reflux disease)    takes Prilosec prn   H/O hiatal hernia    History of kidney stones    many yrs ago   Hx of radiation therapy 04/26/12 -06/10/12   left breast   Hyperlipidemia    takes Simvastatin daily   Hypertension    takes Amlodipine and Diovan daily   Insomnia    takes Ambien nightly   Urinary urgency     Past Surgical History:  Procedure Laterality Date   APPENDECTOMY     bladder tack     BREAST BIOPSY  1998   left    DILATION AND CURETTAGE OF UTERUS     ENDOSCOPIC RETROGRADE CHOLANGIOPANCREATOGRAPHY (ERCP) WITH PROPOFOL N/A 02/01/2014   Procedure: ENDOSCOPIC RETROGRADE CHOLANGIOPANCREATOGRAPHY (ERCP) WITH PROPOFOL;  Surgeon: DMilus Banister MD;  [facility];  Service: Endoscopy;  Laterality: N/A;   ESOPHAGOGASTRODUODENOSCOPY     ESOPHAGOGASTRODUODENOSCOPY N/A 06/26/2016   Procedure: ESOPHAGOGASTRODUODENOSCOPY (EGD);  Surgeon: DMilus Banister MD;  Location[place] [facility]  Service:  Endoscopy;  Laterality: N/A;   ESOPHAGOGASTRODUODENOSCOPY (EGD) WITH PROPOFOL  12/19/2013   Procedure: ESOPHAGOGASTRODUODENOSCOPY (EGD) WITH PROPOFOL;  Surgeon: Beryle Beams, MD;  Location: [facility];  Service: Endoscopy;;   ESOPHAGOGASTRODUODENOSCOPY (EGD) WITH PROPOFOL N/A 09/10/2016   Procedure: ESOPHAGOGASTRODUODENOSCOPY (EGD) WITH PROPOFOL;  Surgeon: [name], MD;  [facility];  Service: Endoscopy;  Laterality: N/A;   EUS  06/04/2011   Procedure: UPPER  ENDOSCOPIC ULTRASOUND (EUS) LINEAR;  Surgeon: Owens Loffler, MD;  [facility];  Service: Endoscopy;  Laterality: N/A;   EUS N/A 02/01/2014   Procedure: UPPER ENDOSCOPIC ULTRASOUND (EUS) LINEAR;  Surgeon: [name], MD;  [facility];  Service: Endoscopy;  Laterality: N/A;   EXPLORATORY LAPAROTOMY      biopsy of intra-abdominal mass   INTRAMEDULLARY (IM) NAIL INTERTROCHANTERIC Right 10/24/2018   Procedure: INTRAMEDULLARY (IM) NAIL INTERTROCHANTRIC;  Surgeon: Gaynelle Arabian, MD;  [facility];  Service: Orthopedics;  Laterality: Right;   LAPAROTOMY N/A 11/17/2016   Procedure: EXPLORATORY LAPAROTOMY WITH LYSIS OF ADHESIONS, SMALL BOWEL RESECTION, REPAIR OF INCARCERATED VENTRAL INCISIONAL HERNIA, INSERTION OF BIOLOGIC MESH PATCH,APPLICATION OF WOUND VAC DRESSING;  Surgeon: Armandina Gemma, MD;  [facility];  Service: General;  Laterality: N/A;   MASTECTOMY W/ SENTINEL NODE BIOPSY  12/18/2011   Procedure: MASTECTOMY WITH SENTINEL LYMPH NODE BIOPSY;  Surgeon: Rolm Bookbinder, MD;  Location: [place];  Service: General;  Laterality: Left;   PORT-A-CATH REMOVAL Right 06/15/2013   Procedure: REMOVAL PORT-A-CATH;  Surgeon: Rolm Bookbinder, MD;  Location: [facility];  Service: General;  Laterality: Right;   PORTACATH PLACEMENT  01/27/2012   Procedure: INSERTION PORT-A-CATH;  Surgeon: Rolm Bookbinder, MD;  Location: [place];  Service: General;  Laterality: Right;  PORT PLACEMENT   RIGHT/LEFT HEART CATH AND CORONARY ANGIOGRAPHY N/A 05/08/2021   Procedure: RIGHT/LEFT HEART CATH AND CORONARY ANGIOGRAPHY;  Surgeon: Jettie Booze, MD;  Location: [facility];  Service: Cardiovascular;  Laterality: N/A;   TOTAL HIP ARTHROPLASTY Right 11/14/2018   Procedure: REMOVAL OF INTRAMEDULLARY NAIL AND POSTERIOR TOTAL HIP ARTHROPLASTY;  Surgeon: Gaynelle Arabian, MD;  [facility];  Service: Orthopedics;  Laterality: Right;   TOTAL SHOULDER  REPLACEMENT  2011   left   TUBAL LIGATION      Family History  Problem Relation Age of Onset   Prostate cancer Father    Breast cancer Other     Social History   Socioeconomic History   Marital status: Married    Spouse name: Not on file   Number of children: Not on file   Years of education: Not on file   Highest education level: Not on file  Occupational History   Occupation: retired  Tobacco Use   Smoking status: Never   Smokeless tobacco: Never  Vaping Use   Vaping Use: Never used  Substance and Sexual Activity   Alcohol use: No   Drug use: No   Sexual activity: Yes    Birth control/protection: Surgical    Comment: mensus age 65, 52st pregnancy 68, no hrt gg4,p3, 1 babay lived a few hours complications  Other Topics Concern   Not on file  Social History Narrative   Not on file   Social Determinants of Health   Financial Resource Strain: Not on file  Food Insecurity: Not on file  Transportation Needs: Not on file  Physical Activity: Not on file  Stress: Not on file  Social Connections: Not on file  Intimate Partner Violence:  Not on file     Prior to Admission medications   Medication Sig Start Date End Date Taking? Authorizing Provider  acetaminophen (TYLENOL) 500 MG tablet Take 1,000 mg by mouth every 6 (six) hours as needed (pain).    [provider]  alendronate (FOSAMAX) 70 MG tablet Take 70 mg by mouth once a week. sunday 04/01/21   [provider]  amoxicillin-clavulanate (AUGMENTIN) 875-125 MG tablet Take 1 tablet by mouth 2 (two) times daily for 3 days. 05/09/21 05/12/21  Jennye Boroughs, MD  atorvastatin (LIPITOR) 40 MG tablet Take 40 mg by mouth daily.  04/27/16   [provider]  Cyanocobalamin (VITAMIN B-12 PO) Take 1 tablet by mouth daily.    [provider]  DULoxetine (CYMBALTA) 20 MG capsule Take 20 mg by mouth 2 (two) times daily. 04/17/21   [provider]  ferrous sulfate 325 (65 FE) MG tablet Take 1  tablet (325 mg total) by mouth daily. 05/09/21   Jennye Boroughs, MD  gabapentin (NEURONTIN) 300 MG capsule TAKE 1 CAPSULE BY MOUTH EVERYDAY AT BEDTIME Patient taking differently: Take 300 mg by mouth at bedtime. 11/08/20   Nicholas Lose, MD  Multiple Vitamin (MULTIVITAMIN WITH MINERALS) TABS tablet Take 1 tablet by mouth daily.    [provider]  omeprazole (PRILOSEC) 40 MG capsule Take 1 capsule (40 mg total) by mouth daily as needed (acid reflux). 05/09/21   Jennye Boroughs, MD  traMADol (ULTRAM) 50 MG tablet Take 2 tablets (100 mg total) by mouth 4 (four) times daily. 11/16/18   Edmisten, Kristie L, PA  valsartan-hydrochlorothiazide (DIOVAN-HCT) 80-12.5 MG tablet Take 1 tablet by mouth daily.    [provider]  Vitamin D, Ergocalciferol, (DRISDOL) 50000 units CAPS capsule Take 50,000 Units by mouth every 7 (seven) days. Sunday    [provider]    No Known Allergies  REVIEW OF SYSTEMS:  General: no fevers/chills/night sweats Eyes: no blurry vision, diplopia, or amaurosis ENT: no sore throat or hearing loss Resp: no cough, wheezing, or hemoptysis CV: no edema or palpitations GI: no abdominal pain, nausea, vomiting, diarrhea, or constipation GU: no dysuria, frequency, or hematuria Skin: no rash Neuro: no headache, numbness, tingling, or weakness of extremities Musculoskeletal: no joint pain or swelling Heme: no bleeding, DVT, or easy bruising Endo: no polydipsia or polyuria  BP (!) 142/78   Pulse 96   Ht _0  (1.549 m)   Wt 129 lb 6.4 oz (58.7 kg)   SpO2 96%   BMI 24.45 kg/m   PHYSICAL EXAM: GEN:  AO x 3 in no acute distress, frail HEENT: normal Dentition: Has dentures in  Neck: JVP normal. +1 carotid upstrokes without bruits. No thyromegaly. Lungs: equal expansion, clear bilaterally CV: Apex is discrete and nondisplaced, RRR with 4/6 SEM Abd: soft, non-tender, non-distended; no bruit; positive bowel sounds Ext: no edema, ecchymoses, or  cyanosis Vascular: 2+ femoral pulses, 2+ radial pulses       Skin: warm and dry without rash Neuro: CN II-XII grossly intact; motor and sensory grossly intact    DATA AND STUDIES:  EKG: Normal sinus rhythm with left ventricular hypertrophy and strain  2D ECHO:  November 2022  1. Left ventricular ejection fraction, by estimation, is 70 to 75%. The  left ventricle has hyperdynamic function. The left ventricle has no  regional wall motion abnormalities. There is mild left ventricular  hypertrophy. Left ventricular diastolic  parameters are consistent with Grade I diastolic dysfunction (impaired  relaxation). Elevated left  atrial pressure.   2. Right ventricular systolic function is normal. The right ventricular  size is normal.   3. Mild mitral valve regurgitation. Moderate to severe mitral annular  calcification.   4. AV is thickened, calcified with restricted motion. Peak and mean  gradients through the valve are 64 and 38 mm Hg respectively.  Dimensionless index is 0.27 Consistent with moderate to severe AS>  Compared to echo report from 2021, mean gradient is  increased (26m to 38 mm ) . The aortic valve is abnormal. Aortic valve  regurgitation is not visualized.   5. The inferior vena cava is normal in size with greater than 50%  respiratory variability, suggesting right atrial pressure of 3 mmHg.   CARDIAC CATH: November 2022 minimal obstructive coronary artery disease with right dominant system  STS RISK CALCULATOR: 6.7%  NHYA CLASS: 2-3    ASSESSMENT AND PLAN:   Aortic valve stenosis I had a long conversation with the patient and her daughter regarding her aortic stenosis.  She is relatively elderly and is likely best served by transcatheter valve replacement.  We will obtain a CT scan.  The patient would like to speak with her family regarding the procedure.  She understands that her aortic stenosis is progressive and will likely continue to grow worse and her to  feel worse if an intervention is not performed.  Certainly would be reasonable to treat her medically if she did not want any invasive procedures but after our meeting it seems that she would like to consider this further and seems relatively interested.  She is already had her cardiac catheterization and we will have her see cardiothoracic surgery for surgical opinion.     I have personally reviewed the patients imaging data as summarized above.  I have reviewed the natural history of aortic stenosis with the patient and family members who are present today. We have discussed the limitations of medical therapy and the poor prognosis associated with symptomatic aortic stenosis. We have also reviewed potential treatment options, including palliative medical therapy, conventional surgical aortic valve replacement, and transcatheter aortic valve replacement. We discussed treatment options in the context of this patient's specific comorbid medical conditions.   All of the patient's questions were answered today. Will make further recommendations based on the results of studies outlined above.   AEarly Osmond MD  05/12/2021 11:48 AM    [place]  [id number]: [phone] Fax: [phone]

## 2021-05-12 NOTE — Patient Instructions (Signed)
Medication Instructions:  Your physician recommends that you continue on your current medications as directed. Please refer to the Current Medication list given to you today.  *If you need a refill on your cardiac medications before your next appointment, please call your pharmacy*   Follow-Up: At [facility], you and your health needs are our priority.  As part of our continuing mission to provide you with exceptional heart care, we have created designated Provider Care Teams.  These Care Teams include your primary Cardiologist (physician) and Advanced Practice Providers (APPs -  Physician Assistants and Nurse Practitioners) who all work together to provide you with the care you need, when you need it.  The Structural Heart Team will be in contact with you to schedule you CT scan and Follow up

## 2021-05-13 DIAGNOSIS — G4700 Insomnia, unspecified: Secondary | ICD-10-CM | POA: Diagnosis not present

## 2021-05-13 DIAGNOSIS — K219 Gastro-esophageal reflux disease without esophagitis: Secondary | ICD-10-CM | POA: Diagnosis not present

## 2021-05-13 DIAGNOSIS — E785 Hyperlipidemia, unspecified: Secondary | ICD-10-CM | POA: Diagnosis not present

## 2021-05-13 DIAGNOSIS — F32A Depression, unspecified: Secondary | ICD-10-CM | POA: Diagnosis not present

## 2021-05-13 DIAGNOSIS — Z8500 Personal history of malignant neoplasm of unspecified digestive organ: Secondary | ICD-10-CM | POA: Diagnosis not present

## 2021-05-13 DIAGNOSIS — Z96641 Presence of right artificial hip joint: Secondary | ICD-10-CM | POA: Diagnosis not present

## 2021-05-13 DIAGNOSIS — D649 Anemia, unspecified: Secondary | ICD-10-CM | POA: Diagnosis not present

## 2021-05-13 DIAGNOSIS — Z96612 Presence of left artificial shoulder joint: Secondary | ICD-10-CM | POA: Diagnosis not present

## 2021-05-13 DIAGNOSIS — I119 Hypertensive heart disease without heart failure: Secondary | ICD-10-CM | POA: Diagnosis not present

## 2021-05-13 DIAGNOSIS — R1310 Dysphagia, unspecified: Secondary | ICD-10-CM | POA: Diagnosis not present

## 2021-05-13 DIAGNOSIS — I700 Atherosclerosis of aorta: Secondary | ICD-10-CM | POA: Diagnosis not present

## 2021-05-13 DIAGNOSIS — Z9181 History of falling: Secondary | ICD-10-CM | POA: Diagnosis not present

## 2021-05-13 DIAGNOSIS — J440 Chronic obstructive pulmonary disease with acute lower respiratory infection: Secondary | ICD-10-CM | POA: Diagnosis not present

## 2021-05-13 DIAGNOSIS — M19011 Primary osteoarthritis, right shoulder: Secondary | ICD-10-CM | POA: Diagnosis not present

## 2021-05-14 DIAGNOSIS — Z9181 History of falling: Secondary | ICD-10-CM | POA: Diagnosis not present

## 2021-05-14 DIAGNOSIS — I700 Atherosclerosis of aorta: Secondary | ICD-10-CM | POA: Diagnosis not present

## 2021-05-14 DIAGNOSIS — E785 Hyperlipidemia, unspecified: Secondary | ICD-10-CM | POA: Diagnosis not present

## 2021-05-14 DIAGNOSIS — Z96612 Presence of left artificial shoulder joint: Secondary | ICD-10-CM | POA: Diagnosis not present

## 2021-05-14 DIAGNOSIS — G4700 Insomnia, unspecified: Secondary | ICD-10-CM | POA: Diagnosis not present

## 2021-05-14 DIAGNOSIS — D649 Anemia, unspecified: Secondary | ICD-10-CM | POA: Diagnosis not present

## 2021-05-14 DIAGNOSIS — R1310 Dysphagia, unspecified: Secondary | ICD-10-CM | POA: Diagnosis not present

## 2021-05-14 DIAGNOSIS — F32A Depression, unspecified: Secondary | ICD-10-CM | POA: Diagnosis not present

## 2021-05-14 DIAGNOSIS — J440 Chronic obstructive pulmonary disease with acute lower respiratory infection: Secondary | ICD-10-CM | POA: Diagnosis not present

## 2021-05-14 DIAGNOSIS — K219 Gastro-esophageal reflux disease without esophagitis: Secondary | ICD-10-CM | POA: Diagnosis not present

## 2021-05-14 DIAGNOSIS — I119 Hypertensive heart disease without heart failure: Secondary | ICD-10-CM | POA: Diagnosis not present

## 2021-05-14 DIAGNOSIS — Z8500 Personal history of malignant neoplasm of unspecified digestive organ: Secondary | ICD-10-CM | POA: Diagnosis not present

## 2021-05-14 DIAGNOSIS — Z96641 Presence of right artificial hip joint: Secondary | ICD-10-CM | POA: Diagnosis not present

## 2021-05-14 DIAGNOSIS — M19011 Primary osteoarthritis, right shoulder: Secondary | ICD-10-CM | POA: Diagnosis not present

## 2021-05-15 DIAGNOSIS — J440 Chronic obstructive pulmonary disease with acute lower respiratory infection: Secondary | ICD-10-CM | POA: Diagnosis not present

## 2021-05-15 DIAGNOSIS — G4700 Insomnia, unspecified: Secondary | ICD-10-CM | POA: Diagnosis not present

## 2021-05-15 DIAGNOSIS — D649 Anemia, unspecified: Secondary | ICD-10-CM | POA: Diagnosis not present

## 2021-05-15 DIAGNOSIS — Z96612 Presence of left artificial shoulder joint: Secondary | ICD-10-CM | POA: Diagnosis not present

## 2021-05-15 DIAGNOSIS — K219 Gastro-esophageal reflux disease without esophagitis: Secondary | ICD-10-CM | POA: Diagnosis not present

## 2021-05-15 DIAGNOSIS — R1310 Dysphagia, unspecified: Secondary | ICD-10-CM | POA: Diagnosis not present

## 2021-05-15 DIAGNOSIS — M19011 Primary osteoarthritis, right shoulder: Secondary | ICD-10-CM | POA: Diagnosis not present

## 2021-05-15 DIAGNOSIS — Z8500 Personal history of malignant neoplasm of unspecified digestive organ: Secondary | ICD-10-CM | POA: Diagnosis not present

## 2021-05-15 DIAGNOSIS — Z96641 Presence of right artificial hip joint: Secondary | ICD-10-CM | POA: Diagnosis not present

## 2021-05-15 DIAGNOSIS — Z9181 History of falling: Secondary | ICD-10-CM | POA: Diagnosis not present

## 2021-05-15 DIAGNOSIS — I700 Atherosclerosis of aorta: Secondary | ICD-10-CM | POA: Diagnosis not present

## 2021-05-15 DIAGNOSIS — F32A Depression, unspecified: Secondary | ICD-10-CM | POA: Diagnosis not present

## 2021-05-15 DIAGNOSIS — E785 Hyperlipidemia, unspecified: Secondary | ICD-10-CM | POA: Diagnosis not present

## 2021-05-15 DIAGNOSIS — M47812 Spondylosis without myelopathy or radiculopathy, cervical region: Secondary | ICD-10-CM | POA: Diagnosis not present

## 2021-05-15 DIAGNOSIS — I119 Hypertensive heart disease without heart failure: Secondary | ICD-10-CM | POA: Diagnosis not present

## 2021-05-16 DIAGNOSIS — Z8500 Personal history of malignant neoplasm of unspecified digestive organ: Secondary | ICD-10-CM | POA: Diagnosis not present

## 2021-05-16 DIAGNOSIS — E785 Hyperlipidemia, unspecified: Secondary | ICD-10-CM | POA: Diagnosis not present

## 2021-05-16 DIAGNOSIS — Z9181 History of falling: Secondary | ICD-10-CM | POA: Diagnosis not present

## 2021-05-16 DIAGNOSIS — M19011 Primary osteoarthritis, right shoulder: Secondary | ICD-10-CM | POA: Diagnosis not present

## 2021-05-16 DIAGNOSIS — K219 Gastro-esophageal reflux disease without esophagitis: Secondary | ICD-10-CM | POA: Diagnosis not present

## 2021-05-16 DIAGNOSIS — Z96641 Presence of right artificial hip joint: Secondary | ICD-10-CM | POA: Diagnosis not present

## 2021-05-16 DIAGNOSIS — I119 Hypertensive heart disease without heart failure: Secondary | ICD-10-CM | POA: Diagnosis not present

## 2021-05-16 DIAGNOSIS — I700 Atherosclerosis of aorta: Secondary | ICD-10-CM | POA: Diagnosis not present

## 2021-05-16 DIAGNOSIS — R1310 Dysphagia, unspecified: Secondary | ICD-10-CM | POA: Diagnosis not present

## 2021-05-16 DIAGNOSIS — D649 Anemia, unspecified: Secondary | ICD-10-CM | POA: Diagnosis not present

## 2021-05-16 DIAGNOSIS — F32A Depression, unspecified: Secondary | ICD-10-CM | POA: Diagnosis not present

## 2021-05-16 DIAGNOSIS — J440 Chronic obstructive pulmonary disease with acute lower respiratory infection: Secondary | ICD-10-CM | POA: Diagnosis not present

## 2021-05-16 DIAGNOSIS — Z96612 Presence of left artificial shoulder joint: Secondary | ICD-10-CM | POA: Diagnosis not present

## 2021-05-16 DIAGNOSIS — G4700 Insomnia, unspecified: Secondary | ICD-10-CM | POA: Diagnosis not present

## 2021-05-19 DIAGNOSIS — I119 Hypertensive heart disease without heart failure: Secondary | ICD-10-CM | POA: Diagnosis not present

## 2021-05-19 DIAGNOSIS — I700 Atherosclerosis of aorta: Secondary | ICD-10-CM | POA: Diagnosis not present

## 2021-05-19 DIAGNOSIS — Z8500 Personal history of malignant neoplasm of unspecified digestive organ: Secondary | ICD-10-CM | POA: Diagnosis not present

## 2021-05-19 DIAGNOSIS — D649 Anemia, unspecified: Secondary | ICD-10-CM | POA: Diagnosis not present

## 2021-05-19 DIAGNOSIS — Z96641 Presence of right artificial hip joint: Secondary | ICD-10-CM | POA: Diagnosis not present

## 2021-05-19 DIAGNOSIS — E785 Hyperlipidemia, unspecified: Secondary | ICD-10-CM | POA: Diagnosis not present

## 2021-05-19 DIAGNOSIS — G4700 Insomnia, unspecified: Secondary | ICD-10-CM | POA: Diagnosis not present

## 2021-05-19 DIAGNOSIS — M19011 Primary osteoarthritis, right shoulder: Secondary | ICD-10-CM | POA: Diagnosis not present

## 2021-05-19 DIAGNOSIS — Z9181 History of falling: Secondary | ICD-10-CM | POA: Diagnosis not present

## 2021-05-19 DIAGNOSIS — J440 Chronic obstructive pulmonary disease with acute lower respiratory infection: Secondary | ICD-10-CM | POA: Diagnosis not present

## 2021-05-19 DIAGNOSIS — Z96612 Presence of left artificial shoulder joint: Secondary | ICD-10-CM | POA: Diagnosis not present

## 2021-05-19 DIAGNOSIS — R1310 Dysphagia, unspecified: Secondary | ICD-10-CM | POA: Diagnosis not present

## 2021-05-19 DIAGNOSIS — K219 Gastro-esophageal reflux disease without esophagitis: Secondary | ICD-10-CM | POA: Diagnosis not present

## 2021-05-19 DIAGNOSIS — F32A Depression, unspecified: Secondary | ICD-10-CM | POA: Diagnosis not present

## 2021-05-20 DIAGNOSIS — I119 Hypertensive heart disease without heart failure: Secondary | ICD-10-CM | POA: Diagnosis not present

## 2021-05-20 DIAGNOSIS — Z8500 Personal history of malignant neoplasm of unspecified digestive organ: Secondary | ICD-10-CM | POA: Diagnosis not present

## 2021-05-20 DIAGNOSIS — M25512 Pain in left shoulder: Secondary | ICD-10-CM | POA: Diagnosis not present

## 2021-05-20 DIAGNOSIS — E785 Hyperlipidemia, unspecified: Secondary | ICD-10-CM | POA: Diagnosis not present

## 2021-05-20 DIAGNOSIS — F32A Depression, unspecified: Secondary | ICD-10-CM | POA: Diagnosis not present

## 2021-05-20 DIAGNOSIS — G4700 Insomnia, unspecified: Secondary | ICD-10-CM | POA: Diagnosis not present

## 2021-05-20 DIAGNOSIS — M25812 Other specified joint disorders, left shoulder: Secondary | ICD-10-CM | POA: Diagnosis not present

## 2021-05-20 DIAGNOSIS — K219 Gastro-esophageal reflux disease without esophagitis: Secondary | ICD-10-CM | POA: Diagnosis not present

## 2021-05-20 DIAGNOSIS — Z96612 Presence of left artificial shoulder joint: Secondary | ICD-10-CM | POA: Diagnosis not present

## 2021-05-20 DIAGNOSIS — Z9181 History of falling: Secondary | ICD-10-CM | POA: Diagnosis not present

## 2021-05-20 DIAGNOSIS — M19011 Primary osteoarthritis, right shoulder: Secondary | ICD-10-CM | POA: Diagnosis not present

## 2021-05-20 DIAGNOSIS — J440 Chronic obstructive pulmonary disease with acute lower respiratory infection: Secondary | ICD-10-CM | POA: Diagnosis not present

## 2021-05-20 DIAGNOSIS — R1310 Dysphagia, unspecified: Secondary | ICD-10-CM | POA: Diagnosis not present

## 2021-05-20 DIAGNOSIS — I700 Atherosclerosis of aorta: Secondary | ICD-10-CM | POA: Diagnosis not present

## 2021-05-20 DIAGNOSIS — Z96641 Presence of right artificial hip joint: Secondary | ICD-10-CM | POA: Diagnosis not present

## 2021-05-20 DIAGNOSIS — D649 Anemia, unspecified: Secondary | ICD-10-CM | POA: Diagnosis not present

## 2021-05-21 ENCOUNTER — Telehealth: Payer: Self-pay

## 2021-05-21 ENCOUNTER — Ambulatory Visit (HOSPITAL_COMMUNITY): Admission: RE | Admit: 2021-05-21 | Payer: Medicare Other | Source: Ambulatory Visit

## 2021-05-21 DIAGNOSIS — Z9181 History of falling: Secondary | ICD-10-CM | POA: Diagnosis not present

## 2021-05-21 DIAGNOSIS — M19011 Primary osteoarthritis, right shoulder: Secondary | ICD-10-CM | POA: Diagnosis not present

## 2021-05-21 DIAGNOSIS — Z96641 Presence of right artificial hip joint: Secondary | ICD-10-CM | POA: Diagnosis not present

## 2021-05-21 DIAGNOSIS — I119 Hypertensive heart disease without heart failure: Secondary | ICD-10-CM | POA: Diagnosis not present

## 2021-05-21 DIAGNOSIS — I700 Atherosclerosis of aorta: Secondary | ICD-10-CM | POA: Diagnosis not present

## 2021-05-21 DIAGNOSIS — G4700 Insomnia, unspecified: Secondary | ICD-10-CM | POA: Diagnosis not present

## 2021-05-21 DIAGNOSIS — Z8500 Personal history of malignant neoplasm of unspecified digestive organ: Secondary | ICD-10-CM | POA: Diagnosis not present

## 2021-05-21 DIAGNOSIS — R1310 Dysphagia, unspecified: Secondary | ICD-10-CM | POA: Diagnosis not present

## 2021-05-21 DIAGNOSIS — D649 Anemia, unspecified: Secondary | ICD-10-CM | POA: Diagnosis not present

## 2021-05-21 DIAGNOSIS — K219 Gastro-esophageal reflux disease without esophagitis: Secondary | ICD-10-CM | POA: Diagnosis not present

## 2021-05-21 DIAGNOSIS — E785 Hyperlipidemia, unspecified: Secondary | ICD-10-CM | POA: Diagnosis not present

## 2021-05-21 DIAGNOSIS — J440 Chronic obstructive pulmonary disease with acute lower respiratory infection: Secondary | ICD-10-CM | POA: Diagnosis not present

## 2021-05-21 DIAGNOSIS — Z96612 Presence of left artificial shoulder joint: Secondary | ICD-10-CM | POA: Diagnosis not present

## 2021-05-21 DIAGNOSIS — F32A Depression, unspecified: Secondary | ICD-10-CM | POA: Diagnosis not present

## 2021-05-21 NOTE — Telephone Encounter (Signed)
  [facility]   MULTIDISCIPLINARY HEART VALVE TEAM   Pt was scheduled for pre TAVR CT scans today, when reviewing the pt's chart these were cancelled this morning. I contacted the pt's daughter, Erline Levine, and she said the pt had her call and cancel these tests.  Per Erline Levine the pt saw Orthopedic MD yesterday in regards to shoulder pain and she was advised that she is not a surgical candidate for shoulder surgery.  The pt has decided that she does not want to have any surgical procedures performed going forward.  I advised Erline Levine that I will forward this message to Dr Ali Lowe and Dr Gardiner Rhyme to make them aware of the pt's decision. I will also cancel the pending appointment with Dr Cyndia Bent. The pt is scheduled with her primary cardiologist, Dr Gardiner Rhyme, in January and I advised Erline Levine that the pt needs to keep this appointment. Erline Levine agreed with plan.

## 2021-05-22 DIAGNOSIS — K219 Gastro-esophageal reflux disease without esophagitis: Secondary | ICD-10-CM | POA: Diagnosis not present

## 2021-05-22 DIAGNOSIS — G4700 Insomnia, unspecified: Secondary | ICD-10-CM | POA: Diagnosis not present

## 2021-05-22 DIAGNOSIS — Z9181 History of falling: Secondary | ICD-10-CM | POA: Diagnosis not present

## 2021-05-22 DIAGNOSIS — R1310 Dysphagia, unspecified: Secondary | ICD-10-CM | POA: Diagnosis not present

## 2021-05-22 DIAGNOSIS — E785 Hyperlipidemia, unspecified: Secondary | ICD-10-CM | POA: Diagnosis not present

## 2021-05-22 DIAGNOSIS — D649 Anemia, unspecified: Secondary | ICD-10-CM | POA: Diagnosis not present

## 2021-05-22 DIAGNOSIS — Z96641 Presence of right artificial hip joint: Secondary | ICD-10-CM | POA: Diagnosis not present

## 2021-05-22 DIAGNOSIS — M19011 Primary osteoarthritis, right shoulder: Secondary | ICD-10-CM | POA: Diagnosis not present

## 2021-05-22 DIAGNOSIS — Z96612 Presence of left artificial shoulder joint: Secondary | ICD-10-CM | POA: Diagnosis not present

## 2021-05-22 DIAGNOSIS — F32A Depression, unspecified: Secondary | ICD-10-CM | POA: Diagnosis not present

## 2021-05-22 DIAGNOSIS — I700 Atherosclerosis of aorta: Secondary | ICD-10-CM | POA: Diagnosis not present

## 2021-05-22 DIAGNOSIS — Z8500 Personal history of malignant neoplasm of unspecified digestive organ: Secondary | ICD-10-CM | POA: Diagnosis not present

## 2021-05-22 DIAGNOSIS — I119 Hypertensive heart disease without heart failure: Secondary | ICD-10-CM | POA: Diagnosis not present

## 2021-05-22 DIAGNOSIS — J440 Chronic obstructive pulmonary disease with acute lower respiratory infection: Secondary | ICD-10-CM | POA: Diagnosis not present

## 2021-05-26 DIAGNOSIS — F32A Depression, unspecified: Secondary | ICD-10-CM | POA: Diagnosis not present

## 2021-05-26 DIAGNOSIS — Z96641 Presence of right artificial hip joint: Secondary | ICD-10-CM | POA: Diagnosis not present

## 2021-05-26 DIAGNOSIS — I700 Atherosclerosis of aorta: Secondary | ICD-10-CM | POA: Diagnosis not present

## 2021-05-26 DIAGNOSIS — K219 Gastro-esophageal reflux disease without esophagitis: Secondary | ICD-10-CM | POA: Diagnosis not present

## 2021-05-26 DIAGNOSIS — Z8500 Personal history of malignant neoplasm of unspecified digestive organ: Secondary | ICD-10-CM | POA: Diagnosis not present

## 2021-05-26 DIAGNOSIS — E785 Hyperlipidemia, unspecified: Secondary | ICD-10-CM | POA: Diagnosis not present

## 2021-05-26 DIAGNOSIS — J440 Chronic obstructive pulmonary disease with acute lower respiratory infection: Secondary | ICD-10-CM | POA: Diagnosis not present

## 2021-05-26 DIAGNOSIS — D649 Anemia, unspecified: Secondary | ICD-10-CM | POA: Diagnosis not present

## 2021-05-26 DIAGNOSIS — Z96612 Presence of left artificial shoulder joint: Secondary | ICD-10-CM | POA: Diagnosis not present

## 2021-05-26 DIAGNOSIS — R1310 Dysphagia, unspecified: Secondary | ICD-10-CM | POA: Diagnosis not present

## 2021-05-26 DIAGNOSIS — Z9181 History of falling: Secondary | ICD-10-CM | POA: Diagnosis not present

## 2021-05-26 DIAGNOSIS — G4700 Insomnia, unspecified: Secondary | ICD-10-CM | POA: Diagnosis not present

## 2021-05-26 DIAGNOSIS — I119 Hypertensive heart disease without heart failure: Secondary | ICD-10-CM | POA: Diagnosis not present

## 2021-05-26 DIAGNOSIS — M19011 Primary osteoarthritis, right shoulder: Secondary | ICD-10-CM | POA: Diagnosis not present

## 2021-05-27 DIAGNOSIS — G4700 Insomnia, unspecified: Secondary | ICD-10-CM | POA: Diagnosis not present

## 2021-05-27 DIAGNOSIS — Z8500 Personal history of malignant neoplasm of unspecified digestive organ: Secondary | ICD-10-CM | POA: Diagnosis not present

## 2021-05-27 DIAGNOSIS — Z9181 History of falling: Secondary | ICD-10-CM | POA: Diagnosis not present

## 2021-05-27 DIAGNOSIS — J440 Chronic obstructive pulmonary disease with acute lower respiratory infection: Secondary | ICD-10-CM | POA: Diagnosis not present

## 2021-05-27 DIAGNOSIS — F32A Depression, unspecified: Secondary | ICD-10-CM | POA: Diagnosis not present

## 2021-05-27 DIAGNOSIS — E785 Hyperlipidemia, unspecified: Secondary | ICD-10-CM | POA: Diagnosis not present

## 2021-05-27 DIAGNOSIS — M19011 Primary osteoarthritis, right shoulder: Secondary | ICD-10-CM | POA: Diagnosis not present

## 2021-05-27 DIAGNOSIS — I119 Hypertensive heart disease without heart failure: Secondary | ICD-10-CM | POA: Diagnosis not present

## 2021-05-27 DIAGNOSIS — R1310 Dysphagia, unspecified: Secondary | ICD-10-CM | POA: Diagnosis not present

## 2021-05-27 DIAGNOSIS — I700 Atherosclerosis of aorta: Secondary | ICD-10-CM | POA: Diagnosis not present

## 2021-05-27 DIAGNOSIS — Z96641 Presence of right artificial hip joint: Secondary | ICD-10-CM | POA: Diagnosis not present

## 2021-05-27 DIAGNOSIS — Z96612 Presence of left artificial shoulder joint: Secondary | ICD-10-CM | POA: Diagnosis not present

## 2021-05-27 DIAGNOSIS — D649 Anemia, unspecified: Secondary | ICD-10-CM | POA: Diagnosis not present

## 2021-05-27 DIAGNOSIS — K219 Gastro-esophageal reflux disease without esophagitis: Secondary | ICD-10-CM | POA: Diagnosis not present

## 2021-05-28 DIAGNOSIS — Z96612 Presence of left artificial shoulder joint: Secondary | ICD-10-CM | POA: Diagnosis not present

## 2021-05-28 DIAGNOSIS — G4700 Insomnia, unspecified: Secondary | ICD-10-CM | POA: Diagnosis not present

## 2021-05-28 DIAGNOSIS — F32A Depression, unspecified: Secondary | ICD-10-CM | POA: Diagnosis not present

## 2021-05-28 DIAGNOSIS — D649 Anemia, unspecified: Secondary | ICD-10-CM | POA: Diagnosis not present

## 2021-05-28 DIAGNOSIS — M19011 Primary osteoarthritis, right shoulder: Secondary | ICD-10-CM | POA: Diagnosis not present

## 2021-05-28 DIAGNOSIS — I700 Atherosclerosis of aorta: Secondary | ICD-10-CM | POA: Diagnosis not present

## 2021-05-28 DIAGNOSIS — K219 Gastro-esophageal reflux disease without esophagitis: Secondary | ICD-10-CM | POA: Diagnosis not present

## 2021-05-28 DIAGNOSIS — E785 Hyperlipidemia, unspecified: Secondary | ICD-10-CM | POA: Diagnosis not present

## 2021-05-28 DIAGNOSIS — R1310 Dysphagia, unspecified: Secondary | ICD-10-CM | POA: Diagnosis not present

## 2021-05-28 DIAGNOSIS — Z9181 History of falling: Secondary | ICD-10-CM | POA: Diagnosis not present

## 2021-05-28 DIAGNOSIS — Z96641 Presence of right artificial hip joint: Secondary | ICD-10-CM | POA: Diagnosis not present

## 2021-05-28 DIAGNOSIS — Z8500 Personal history of malignant neoplasm of unspecified digestive organ: Secondary | ICD-10-CM | POA: Diagnosis not present

## 2021-05-28 DIAGNOSIS — I119 Hypertensive heart disease without heart failure: Secondary | ICD-10-CM | POA: Diagnosis not present

## 2021-05-28 DIAGNOSIS — J440 Chronic obstructive pulmonary disease with acute lower respiratory infection: Secondary | ICD-10-CM | POA: Diagnosis not present

## 2021-06-02 DIAGNOSIS — G4700 Insomnia, unspecified: Secondary | ICD-10-CM | POA: Diagnosis not present

## 2021-06-02 DIAGNOSIS — F32A Depression, unspecified: Secondary | ICD-10-CM | POA: Diagnosis not present

## 2021-06-02 DIAGNOSIS — R1310 Dysphagia, unspecified: Secondary | ICD-10-CM | POA: Diagnosis not present

## 2021-06-02 DIAGNOSIS — Z96612 Presence of left artificial shoulder joint: Secondary | ICD-10-CM | POA: Diagnosis not present

## 2021-06-02 DIAGNOSIS — K219 Gastro-esophageal reflux disease without esophagitis: Secondary | ICD-10-CM | POA: Diagnosis not present

## 2021-06-02 DIAGNOSIS — Z96641 Presence of right artificial hip joint: Secondary | ICD-10-CM | POA: Diagnosis not present

## 2021-06-02 DIAGNOSIS — Z8500 Personal history of malignant neoplasm of unspecified digestive organ: Secondary | ICD-10-CM | POA: Diagnosis not present

## 2021-06-02 DIAGNOSIS — I700 Atherosclerosis of aorta: Secondary | ICD-10-CM | POA: Diagnosis not present

## 2021-06-02 DIAGNOSIS — M19011 Primary osteoarthritis, right shoulder: Secondary | ICD-10-CM | POA: Diagnosis not present

## 2021-06-02 DIAGNOSIS — J440 Chronic obstructive pulmonary disease with acute lower respiratory infection: Secondary | ICD-10-CM | POA: Diagnosis not present

## 2021-06-02 DIAGNOSIS — E785 Hyperlipidemia, unspecified: Secondary | ICD-10-CM | POA: Diagnosis not present

## 2021-06-02 DIAGNOSIS — Z9181 History of falling: Secondary | ICD-10-CM | POA: Diagnosis not present

## 2021-06-02 DIAGNOSIS — D649 Anemia, unspecified: Secondary | ICD-10-CM | POA: Diagnosis not present

## 2021-06-02 DIAGNOSIS — I119 Hypertensive heart disease without heart failure: Secondary | ICD-10-CM | POA: Diagnosis not present

## 2021-06-04 DIAGNOSIS — F32A Depression, unspecified: Secondary | ICD-10-CM | POA: Diagnosis not present

## 2021-06-04 DIAGNOSIS — K219 Gastro-esophageal reflux disease without esophagitis: Secondary | ICD-10-CM | POA: Diagnosis not present

## 2021-06-04 DIAGNOSIS — Z96612 Presence of left artificial shoulder joint: Secondary | ICD-10-CM | POA: Diagnosis not present

## 2021-06-04 DIAGNOSIS — M19011 Primary osteoarthritis, right shoulder: Secondary | ICD-10-CM | POA: Diagnosis not present

## 2021-06-04 DIAGNOSIS — I119 Hypertensive heart disease without heart failure: Secondary | ICD-10-CM | POA: Diagnosis not present

## 2021-06-04 DIAGNOSIS — J440 Chronic obstructive pulmonary disease with acute lower respiratory infection: Secondary | ICD-10-CM | POA: Diagnosis not present

## 2021-06-04 DIAGNOSIS — Z96641 Presence of right artificial hip joint: Secondary | ICD-10-CM | POA: Diagnosis not present

## 2021-06-04 DIAGNOSIS — E785 Hyperlipidemia, unspecified: Secondary | ICD-10-CM | POA: Diagnosis not present

## 2021-06-04 DIAGNOSIS — D649 Anemia, unspecified: Secondary | ICD-10-CM | POA: Diagnosis not present

## 2021-06-04 DIAGNOSIS — I700 Atherosclerosis of aorta: Secondary | ICD-10-CM | POA: Diagnosis not present

## 2021-06-04 DIAGNOSIS — Z9181 History of falling: Secondary | ICD-10-CM | POA: Diagnosis not present

## 2021-06-04 DIAGNOSIS — Z8500 Personal history of malignant neoplasm of unspecified digestive organ: Secondary | ICD-10-CM | POA: Diagnosis not present

## 2021-06-04 DIAGNOSIS — G4700 Insomnia, unspecified: Secondary | ICD-10-CM | POA: Diagnosis not present

## 2021-06-04 DIAGNOSIS — R1310 Dysphagia, unspecified: Secondary | ICD-10-CM | POA: Diagnosis not present

## 2021-06-09 DIAGNOSIS — I119 Hypertensive heart disease without heart failure: Secondary | ICD-10-CM | POA: Diagnosis not present

## 2021-06-09 DIAGNOSIS — Z8500 Personal history of malignant neoplasm of unspecified digestive organ: Secondary | ICD-10-CM | POA: Diagnosis not present

## 2021-06-09 DIAGNOSIS — F32A Depression, unspecified: Secondary | ICD-10-CM | POA: Diagnosis not present

## 2021-06-09 DIAGNOSIS — Z9181 History of falling: Secondary | ICD-10-CM | POA: Diagnosis not present

## 2021-06-09 DIAGNOSIS — D649 Anemia, unspecified: Secondary | ICD-10-CM | POA: Diagnosis not present

## 2021-06-09 DIAGNOSIS — Z96641 Presence of right artificial hip joint: Secondary | ICD-10-CM | POA: Diagnosis not present

## 2021-06-09 DIAGNOSIS — I700 Atherosclerosis of aorta: Secondary | ICD-10-CM | POA: Diagnosis not present

## 2021-06-09 DIAGNOSIS — G4700 Insomnia, unspecified: Secondary | ICD-10-CM | POA: Diagnosis not present

## 2021-06-09 DIAGNOSIS — E785 Hyperlipidemia, unspecified: Secondary | ICD-10-CM | POA: Diagnosis not present

## 2021-06-09 DIAGNOSIS — K219 Gastro-esophageal reflux disease without esophagitis: Secondary | ICD-10-CM | POA: Diagnosis not present

## 2021-06-09 DIAGNOSIS — Z96612 Presence of left artificial shoulder joint: Secondary | ICD-10-CM | POA: Diagnosis not present

## 2021-06-09 DIAGNOSIS — R1310 Dysphagia, unspecified: Secondary | ICD-10-CM | POA: Diagnosis not present

## 2021-06-09 DIAGNOSIS — M19011 Primary osteoarthritis, right shoulder: Secondary | ICD-10-CM | POA: Diagnosis not present

## 2021-06-09 DIAGNOSIS — J440 Chronic obstructive pulmonary disease with acute lower respiratory infection: Secondary | ICD-10-CM | POA: Diagnosis not present

## 2021-06-11 DIAGNOSIS — I700 Atherosclerosis of aorta: Secondary | ICD-10-CM | POA: Diagnosis not present

## 2021-06-11 DIAGNOSIS — R1310 Dysphagia, unspecified: Secondary | ICD-10-CM | POA: Diagnosis not present

## 2021-06-11 DIAGNOSIS — K219 Gastro-esophageal reflux disease without esophagitis: Secondary | ICD-10-CM | POA: Diagnosis not present

## 2021-06-11 DIAGNOSIS — Z9181 History of falling: Secondary | ICD-10-CM | POA: Diagnosis not present

## 2021-06-11 DIAGNOSIS — J440 Chronic obstructive pulmonary disease with acute lower respiratory infection: Secondary | ICD-10-CM | POA: Diagnosis not present

## 2021-06-11 DIAGNOSIS — E782 Mixed hyperlipidemia: Secondary | ICD-10-CM | POA: Diagnosis not present

## 2021-06-11 DIAGNOSIS — G2581 Restless legs syndrome: Secondary | ICD-10-CM | POA: Diagnosis not present

## 2021-06-11 DIAGNOSIS — Z23 Encounter for immunization: Secondary | ICD-10-CM | POA: Diagnosis not present

## 2021-06-11 DIAGNOSIS — D649 Anemia, unspecified: Secondary | ICD-10-CM | POA: Diagnosis not present

## 2021-06-11 DIAGNOSIS — I10 Essential (primary) hypertension: Secondary | ICD-10-CM | POA: Diagnosis not present

## 2021-06-11 DIAGNOSIS — Z96641 Presence of right artificial hip joint: Secondary | ICD-10-CM | POA: Diagnosis not present

## 2021-06-11 DIAGNOSIS — Z8500 Personal history of malignant neoplasm of unspecified digestive organ: Secondary | ICD-10-CM | POA: Diagnosis not present

## 2021-06-11 DIAGNOSIS — F32A Depression, unspecified: Secondary | ICD-10-CM | POA: Diagnosis not present

## 2021-06-11 DIAGNOSIS — M19011 Primary osteoarthritis, right shoulder: Secondary | ICD-10-CM | POA: Diagnosis not present

## 2021-06-11 DIAGNOSIS — I350 Nonrheumatic aortic (valve) stenosis: Secondary | ICD-10-CM | POA: Diagnosis not present

## 2021-06-11 DIAGNOSIS — Z96612 Presence of left artificial shoulder joint: Secondary | ICD-10-CM | POA: Diagnosis not present

## 2021-06-11 DIAGNOSIS — I119 Hypertensive heart disease without heart failure: Secondary | ICD-10-CM | POA: Diagnosis not present

## 2021-06-11 DIAGNOSIS — N1831 Chronic kidney disease, stage 3a: Secondary | ICD-10-CM | POA: Diagnosis not present

## 2021-06-11 DIAGNOSIS — E785 Hyperlipidemia, unspecified: Secondary | ICD-10-CM | POA: Diagnosis not present

## 2021-06-11 DIAGNOSIS — D6489 Other specified anemias: Secondary | ICD-10-CM | POA: Diagnosis not present

## 2021-06-11 DIAGNOSIS — G4700 Insomnia, unspecified: Secondary | ICD-10-CM | POA: Diagnosis not present

## 2021-06-16 DIAGNOSIS — Z9181 History of falling: Secondary | ICD-10-CM | POA: Diagnosis not present

## 2021-06-16 DIAGNOSIS — K219 Gastro-esophageal reflux disease without esophagitis: Secondary | ICD-10-CM | POA: Diagnosis not present

## 2021-06-16 DIAGNOSIS — I700 Atherosclerosis of aorta: Secondary | ICD-10-CM | POA: Diagnosis not present

## 2021-06-16 DIAGNOSIS — M19011 Primary osteoarthritis, right shoulder: Secondary | ICD-10-CM | POA: Diagnosis not present

## 2021-06-16 DIAGNOSIS — Z96612 Presence of left artificial shoulder joint: Secondary | ICD-10-CM | POA: Diagnosis not present

## 2021-06-16 DIAGNOSIS — E785 Hyperlipidemia, unspecified: Secondary | ICD-10-CM | POA: Diagnosis not present

## 2021-06-16 DIAGNOSIS — Z8500 Personal history of malignant neoplasm of unspecified digestive organ: Secondary | ICD-10-CM | POA: Diagnosis not present

## 2021-06-16 DIAGNOSIS — F32A Depression, unspecified: Secondary | ICD-10-CM | POA: Diagnosis not present

## 2021-06-16 DIAGNOSIS — D649 Anemia, unspecified: Secondary | ICD-10-CM | POA: Diagnosis not present

## 2021-06-16 DIAGNOSIS — R1310 Dysphagia, unspecified: Secondary | ICD-10-CM | POA: Diagnosis not present

## 2021-06-16 DIAGNOSIS — I119 Hypertensive heart disease without heart failure: Secondary | ICD-10-CM | POA: Diagnosis not present

## 2021-06-16 DIAGNOSIS — Z96641 Presence of right artificial hip joint: Secondary | ICD-10-CM | POA: Diagnosis not present

## 2021-06-16 DIAGNOSIS — J440 Chronic obstructive pulmonary disease with acute lower respiratory infection: Secondary | ICD-10-CM | POA: Diagnosis not present

## 2021-06-16 DIAGNOSIS — G4700 Insomnia, unspecified: Secondary | ICD-10-CM | POA: Diagnosis not present

## 2021-06-18 DIAGNOSIS — I700 Atherosclerosis of aorta: Secondary | ICD-10-CM | POA: Diagnosis not present

## 2021-06-18 DIAGNOSIS — D649 Anemia, unspecified: Secondary | ICD-10-CM | POA: Diagnosis not present

## 2021-06-18 DIAGNOSIS — Z9181 History of falling: Secondary | ICD-10-CM | POA: Diagnosis not present

## 2021-06-18 DIAGNOSIS — I119 Hypertensive heart disease without heart failure: Secondary | ICD-10-CM | POA: Diagnosis not present

## 2021-06-18 DIAGNOSIS — R1310 Dysphagia, unspecified: Secondary | ICD-10-CM | POA: Diagnosis not present

## 2021-06-18 DIAGNOSIS — F32A Depression, unspecified: Secondary | ICD-10-CM | POA: Diagnosis not present

## 2021-06-18 DIAGNOSIS — E785 Hyperlipidemia, unspecified: Secondary | ICD-10-CM | POA: Diagnosis not present

## 2021-06-18 DIAGNOSIS — K219 Gastro-esophageal reflux disease without esophagitis: Secondary | ICD-10-CM | POA: Diagnosis not present

## 2021-06-18 DIAGNOSIS — J440 Chronic obstructive pulmonary disease with acute lower respiratory infection: Secondary | ICD-10-CM | POA: Diagnosis not present

## 2021-06-18 DIAGNOSIS — M19011 Primary osteoarthritis, right shoulder: Secondary | ICD-10-CM | POA: Diagnosis not present

## 2021-06-18 DIAGNOSIS — Z96641 Presence of right artificial hip joint: Secondary | ICD-10-CM | POA: Diagnosis not present

## 2021-06-18 DIAGNOSIS — Z8500 Personal history of malignant neoplasm of unspecified digestive organ: Secondary | ICD-10-CM | POA: Diagnosis not present

## 2021-06-18 DIAGNOSIS — Z96612 Presence of left artificial shoulder joint: Secondary | ICD-10-CM | POA: Diagnosis not present

## 2021-06-18 DIAGNOSIS — G4700 Insomnia, unspecified: Secondary | ICD-10-CM | POA: Diagnosis not present

## 2021-06-24 DIAGNOSIS — Z8500 Personal history of malignant neoplasm of unspecified digestive organ: Secondary | ICD-10-CM | POA: Diagnosis not present

## 2021-06-24 DIAGNOSIS — K219 Gastro-esophageal reflux disease without esophagitis: Secondary | ICD-10-CM | POA: Diagnosis not present

## 2021-06-24 DIAGNOSIS — G4700 Insomnia, unspecified: Secondary | ICD-10-CM | POA: Diagnosis not present

## 2021-06-24 DIAGNOSIS — Z96641 Presence of right artificial hip joint: Secondary | ICD-10-CM | POA: Diagnosis not present

## 2021-06-24 DIAGNOSIS — I700 Atherosclerosis of aorta: Secondary | ICD-10-CM | POA: Diagnosis not present

## 2021-06-24 DIAGNOSIS — M19011 Primary osteoarthritis, right shoulder: Secondary | ICD-10-CM | POA: Diagnosis not present

## 2021-06-24 DIAGNOSIS — J440 Chronic obstructive pulmonary disease with acute lower respiratory infection: Secondary | ICD-10-CM | POA: Diagnosis not present

## 2021-06-24 DIAGNOSIS — F32A Depression, unspecified: Secondary | ICD-10-CM | POA: Diagnosis not present

## 2021-06-24 DIAGNOSIS — Z96612 Presence of left artificial shoulder joint: Secondary | ICD-10-CM | POA: Diagnosis not present

## 2021-06-24 DIAGNOSIS — Z9181 History of falling: Secondary | ICD-10-CM | POA: Diagnosis not present

## 2021-06-24 DIAGNOSIS — R1310 Dysphagia, unspecified: Secondary | ICD-10-CM | POA: Diagnosis not present

## 2021-06-24 DIAGNOSIS — I119 Hypertensive heart disease without heart failure: Secondary | ICD-10-CM | POA: Diagnosis not present

## 2021-06-24 DIAGNOSIS — D649 Anemia, unspecified: Secondary | ICD-10-CM | POA: Diagnosis not present

## 2021-06-24 DIAGNOSIS — E785 Hyperlipidemia, unspecified: Secondary | ICD-10-CM | POA: Diagnosis not present

## 2021-06-26 DIAGNOSIS — G4700 Insomnia, unspecified: Secondary | ICD-10-CM | POA: Diagnosis not present

## 2021-06-26 DIAGNOSIS — Z9181 History of falling: Secondary | ICD-10-CM | POA: Diagnosis not present

## 2021-06-26 DIAGNOSIS — I119 Hypertensive heart disease without heart failure: Secondary | ICD-10-CM | POA: Diagnosis not present

## 2021-06-26 DIAGNOSIS — R1310 Dysphagia, unspecified: Secondary | ICD-10-CM | POA: Diagnosis not present

## 2021-06-26 DIAGNOSIS — M19011 Primary osteoarthritis, right shoulder: Secondary | ICD-10-CM | POA: Diagnosis not present

## 2021-06-26 DIAGNOSIS — Z8500 Personal history of malignant neoplasm of unspecified digestive organ: Secondary | ICD-10-CM | POA: Diagnosis not present

## 2021-06-26 DIAGNOSIS — I700 Atherosclerosis of aorta: Secondary | ICD-10-CM | POA: Diagnosis not present

## 2021-06-26 DIAGNOSIS — E785 Hyperlipidemia, unspecified: Secondary | ICD-10-CM | POA: Diagnosis not present

## 2021-06-26 DIAGNOSIS — K219 Gastro-esophageal reflux disease without esophagitis: Secondary | ICD-10-CM | POA: Diagnosis not present

## 2021-06-26 DIAGNOSIS — J440 Chronic obstructive pulmonary disease with acute lower respiratory infection: Secondary | ICD-10-CM | POA: Diagnosis not present

## 2021-06-26 DIAGNOSIS — D649 Anemia, unspecified: Secondary | ICD-10-CM | POA: Diagnosis not present

## 2021-06-26 DIAGNOSIS — Z96641 Presence of right artificial hip joint: Secondary | ICD-10-CM | POA: Diagnosis not present

## 2021-06-26 DIAGNOSIS — Z96612 Presence of left artificial shoulder joint: Secondary | ICD-10-CM | POA: Diagnosis not present

## 2021-06-26 DIAGNOSIS — F32A Depression, unspecified: Secondary | ICD-10-CM | POA: Diagnosis not present

## 2021-07-02 ENCOUNTER — Encounter: Payer: Medicare Other | Admitting: Surgery

## 2021-07-13 NOTE — Progress Notes (Deleted)
Cardiology Office Note:    Date:  07/13/2021   ID:  Brandy [name], DOB 10-20-1932, MRN [id number]  PCP:  Brandy Lass, MD  Cardiologist:  Donato Heinz, MD  Electrophysiologist:  None   Referring MD: Brandy Lass, MD   No chief complaint on file. ***  History of Present Illness:    Brandy [name] is a 88 y.o. female with a hx of aortic stenosis, breast cancer, hypertension, hyperlipidemia who presents for follow-up.  She was admitted in November 2022 with fall and possible syncope.  Echocardiogram showed moderate to severe aortic stenosis (mean gradient 30 mmHg, AVA 0.8 cm, DI 0.27).  RHC/LHC showed normal coronary arteries and filling pressures.  She saw Dr. Ali Lowe in valve clinic.  Patient declining intervention.  Past Medical History:  Diagnosis Date   Anemia    Arthritis    shoulders   Asthma    mild, no inhalers used   Breast cancer ([place]) 12/18/11   left breast masectomy=metastatic ca in (1/1) lymph node ,invasive ductal ca,2 foci,,dcis,lymph ovascular invasion identified,surgical resection margins neg for ca,additional tissue=benign skin and subcutaneous tissue   Bronchitis    hx of;last time >7yr ago   Depression    takes Celexa daily   Difficult intubation    Dysphagia    Full dentures    Gall stones 2015   GERD (gastroesophageal reflux disease)    takes Prilosec prn   H/O hiatal hernia    History of kidney stones    many yrs ago   Hx of radiation therapy 04/26/12 -06/10/12   left breast   Hyperlipidemia    takes Simvastatin daily   Hypertension    takes Amlodipine and Diovan daily   Insomnia    takes Ambien nightly   Urinary urgency     Past Surgical History:  Procedure Laterality Date   APPENDECTOMY     bladder tack     BREAST BIOPSY  1998   left    DILATION AND CURETTAGE OF UTERUS     ENDOSCOPIC RETROGRADE CHOLANGIOPANCREATOGRAPHY (ERCP) WITH PROPOFOL N/A 02/01/2014   Procedure: ENDOSCOPIC RETROGRADE CHOLANGIOPANCREATOGRAPHY (ERCP) WITH  PROPOFOL;  Surgeon: [name], MD;  [facility];  Service: Endoscopy;  Laterality: N/A;   ESOPHAGOGASTRODUODENOSCOPY     ESOPHAGOGASTRODUODENOSCOPY N/A 06/26/2016   Procedure: ESOPHAGOGASTRODUODENOSCOPY (EGD);  Surgeon: [name], MD;  Location: [facility];  Service: Endoscopy;  Laterality: N/A;   ESOPHAGOGASTRODUODENOSCOPY (EGD) WITH PROPOFOL  12/19/2013   Procedure: ESOPHAGOGASTRODUODENOSCOPY (EGD) WITH PROPOFOL;  Surgeon: Beryle Beams, MD;  Location: [facility] [place];  Service: Endoscopy;;   ESOPHAGOGASTRODUODENOSCOPY (EGD) WITH PROPOFOL N/A 09/10/2016   Procedure: ESOPHAGOGASTRODUODENOSCOPY (EGD) WITH PROPOFOL;  Surgeon: [name], MD;  [facility];  Service: Endoscopy;  Laterality: N/A;   EUS  06/04/2011   Procedure: UPPER ENDOSCOPIC ULTRASOUND (EUS) LINEAR;  Surgeon: Owens Loffler, MD;  [facility];  Service: Endoscopy;  Laterality: N/A;   EUS N/A 02/01/2014   Procedure: UPPER ENDOSCOPIC ULTRASOUND (EUS) LINEAR;  Surgeon: [name], MD;  [facility];  Service: Endoscopy;  Laterality: N/A;   EXPLORATORY LAPAROTOMY      biopsy of intra-abdominal mass   INTRAMEDULLARY (IM) NAIL INTERTROCHANTERIC Right 10/24/2018   Procedure: INTRAMEDULLARY (IM) NAIL INTERTROCHANTRIC;  Surgeon: Gaynelle Arabian, MD;  [facility];  Service: Orthopedics;  Laterality: Right;   LAPAROTOMY N/A 11/17/2016   Procedure: EXPLORATORY LAPAROTOMY WITH LYSIS OF ADHESIONS, SMALL BOWEL RESECTION, REPAIR OF INCARCERATED VENTRAL INCISIONAL HERNIA, INSERTION OF BIOLOGIC  [place];  Surgeon: Armandina Gemma, MD;  [facility];  Service: General;  Laterality: N/A;   MASTECTOMY W/ SENTINEL NODE BIOPSY  12/18/2011   Procedure: MASTECTOMY WITH SENTINEL LYMPH NODE BIOPSY;  Surgeon: Rolm Bookbinder, MD;  Location: [facility];  Service: General;  Laterality: Left;   PORT-A-CATH REMOVAL Right 06/15/2013   Procedure: REMOVAL PORT-A-CATH;   Surgeon: Rolm Bookbinder, MD;  Location: [facility];  Service: General;  Laterality: Right;   PORTACATH PLACEMENT  01/27/2012   Procedure: INSERTION PORT-A-CATH;  Surgeon: Rolm Bookbinder, MD;  Location: [facility];  Service: General;  Laterality: Right;  PORT PLACEMENT   RIGHT/LEFT HEART CATH AND CORONARY ANGIOGRAPHY N/A 05/08/2021   Procedure: RIGHT/LEFT HEART CATH AND CORONARY ANGIOGRAPHY;  Surgeon: Jettie Booze, MD;  Location: [facility];  Service: Cardiovascular;  Laterality: N/A;   TOTAL HIP ARTHROPLASTY Right 11/14/2018   Procedure: REMOVAL OF INTRAMEDULLARY NAIL AND POSTERIOR TOTAL HIP ARTHROPLASTY;  Surgeon: Gaynelle Arabian, MD;  [facility];  Service: Orthopedics;  Laterality: Right;   TOTAL SHOULDER REPLACEMENT  2011   left   TUBAL LIGATION      Current Medications: No outpatient medications have been marked as taking for the 07/18/21 encounter (Appointment) with Donato Heinz, MD.     Allergies:   Patient has no known allergies.   Social History   Socioeconomic History   Marital status: Married    Spouse name: Not on file   Number of children: Not on file   Years of education: Not on file   Highest education level: Not on file  Occupational History   Occupation: retired  Tobacco Use   Smoking status: Never   Smokeless tobacco: Never  Vaping Use   Vaping Use: Never used  Substance and Sexual Activity   Alcohol use: No   Drug use: No   Sexual activity: Yes    Birth control/protection: Surgical    Comment: mensus age 54, 70st pregnancy 55, no hrt gg4,p3, 1 babay lived a few hours complications  Other Topics Concern   Not on file  Social History Narrative   Not on file   Social Determinants of Health   Financial Resource Strain: Not on file  Food Insecurity: Not on file  Transportation Needs: Not on file  Physical Activity: Not on file  Stress: Not on file  Social Connections: Not on file      Family History: The patient's ***family history includes Breast cancer in an other family member; Prostate cancer in her father.  ROS:   Please see the history of present illness.    *** All other systems reviewed and are negative.  EKGs/Labs/Other Studies Reviewed:    The following studies were reviewed today: ***  EKG:  EKG is *** ordered today.  The ekg ordered today demonstrates ***  Recent Labs: 11/19/2020: Magnesium 1.9 05/06/2021: B Natriuretic Peptide 143.4 05/07/2021: ALT 12 05/08/2021: Hemoglobin 8.8; Platelets 278 05/09/2021: BUN 14; Creatinine, Ser 0.69; Potassium 3.3; Sodium 139  Recent Lipid Panel    Component Value Date/Time   TRIG 295 (H) 10/29/2018 1340    Physical Exam:    VS:  There were no vitals taken for this visit.    Wt Readings from Last 3 Encounters:  05/12/21 129 lb 6.4 oz (58.7 kg)  05/09/21 135 lb 12.9 oz (61.6 kg)  11/19/20 130 lb (59 kg)     GEN: *** Well nourished, well developed in no acute  distress HEENT: Normal NECK: No JVD; No carotid bruits LYMPHATICS: No lymphadenopathy CARDIAC: ***RRR, no murmurs, rubs, gallops RESPIRATORY:  Clear to auscultation without rales, wheezing or rhonchi  ABDOMEN: Soft, non-tender, non-distended MUSCULOSKELETAL:  No edema; No deformity  SKIN: Warm and dry NEUROLOGIC:  Alert and oriented x 3 PSYCHIATRIC:  Normal affect   ASSESSMENT:    No diagnosis found. PLAN:    Aortic stenosis: admitted in November 2022 with fall and possible syncope.  Echocardiogram showed moderate to severe aortic stenosis (mean gradient 30 mmHg, AVA 0.8 cm, DI 0.27).  RHC/LHC showed normal coronary arteries and filling pressures.  She saw Dr. Ali Lowe in valve clinic.  Patient declining intervention.  Hypertension: On valsartan-HCTZ 80-12.5 mg daily  Hyperlipidemia: On atorvastatin 40 mg daily  RTC in***  Medication Adjustments/Labs and Tests Ordered: Current medicines are reviewed at length with the patient today.   Concerns regarding medicines are outlined above.  No orders of the defined types were placed in this encounter.  No orders of the defined types were placed in this encounter.   There are no Patient Instructions on file for this visit.   Signed, Donato Heinz, MD  07/13/2021 9:03 PM    [place]

## 2021-07-18 ENCOUNTER — Ambulatory Visit: Payer: Medicare Other | Admitting: Cardiology

## 2021-07-23 DIAGNOSIS — I129 Hypertensive chronic kidney disease with stage 1 through stage 4 chronic kidney disease, or unspecified chronic kidney disease: Secondary | ICD-10-CM | POA: Diagnosis not present

## 2021-07-23 DIAGNOSIS — E782 Mixed hyperlipidemia: Secondary | ICD-10-CM | POA: Diagnosis not present

## 2021-07-23 DIAGNOSIS — E7800 Pure hypercholesterolemia, unspecified: Secondary | ICD-10-CM | POA: Diagnosis not present

## 2021-07-23 DIAGNOSIS — I10 Essential (primary) hypertension: Secondary | ICD-10-CM | POA: Diagnosis not present

## 2021-07-23 DIAGNOSIS — N1831 Chronic kidney disease, stage 3a: Secondary | ICD-10-CM | POA: Diagnosis not present

## 2021-07-25 DIAGNOSIS — M19011 Primary osteoarthritis, right shoulder: Secondary | ICD-10-CM | POA: Diagnosis not present

## 2021-07-25 DIAGNOSIS — R2681 Unsteadiness on feet: Secondary | ICD-10-CM | POA: Diagnosis not present

## 2021-07-25 DIAGNOSIS — M19012 Primary osteoarthritis, left shoulder: Secondary | ICD-10-CM | POA: Diagnosis not present

## 2021-07-25 DIAGNOSIS — R279 Unspecified lack of coordination: Secondary | ICD-10-CM | POA: Diagnosis not present

## 2021-07-25 DIAGNOSIS — G2581 Restless legs syndrome: Secondary | ICD-10-CM | POA: Diagnosis not present

## 2021-07-28 ENCOUNTER — Other Ambulatory Visit: Payer: Self-pay | Admitting: Physician Assistant

## 2021-07-29 DIAGNOSIS — G2581 Restless legs syndrome: Secondary | ICD-10-CM | POA: Diagnosis not present

## 2021-07-29 DIAGNOSIS — M19012 Primary osteoarthritis, left shoulder: Secondary | ICD-10-CM | POA: Diagnosis not present

## 2021-07-29 DIAGNOSIS — R279 Unspecified lack of coordination: Secondary | ICD-10-CM | POA: Diagnosis not present

## 2021-07-29 DIAGNOSIS — R2681 Unsteadiness on feet: Secondary | ICD-10-CM | POA: Diagnosis not present

## 2021-07-29 DIAGNOSIS — M19011 Primary osteoarthritis, right shoulder: Secondary | ICD-10-CM | POA: Diagnosis not present

## 2021-07-30 DIAGNOSIS — R2681 Unsteadiness on feet: Secondary | ICD-10-CM | POA: Diagnosis not present

## 2021-07-30 DIAGNOSIS — R279 Unspecified lack of coordination: Secondary | ICD-10-CM | POA: Diagnosis not present

## 2021-07-30 DIAGNOSIS — M19011 Primary osteoarthritis, right shoulder: Secondary | ICD-10-CM | POA: Diagnosis not present

## 2021-07-30 DIAGNOSIS — M19012 Primary osteoarthritis, left shoulder: Secondary | ICD-10-CM | POA: Diagnosis not present

## 2021-07-30 DIAGNOSIS — G2581 Restless legs syndrome: Secondary | ICD-10-CM | POA: Diagnosis not present

## 2021-07-31 DIAGNOSIS — M19011 Primary osteoarthritis, right shoulder: Secondary | ICD-10-CM | POA: Diagnosis not present

## 2021-07-31 DIAGNOSIS — R2681 Unsteadiness on feet: Secondary | ICD-10-CM | POA: Diagnosis not present

## 2021-07-31 DIAGNOSIS — M19012 Primary osteoarthritis, left shoulder: Secondary | ICD-10-CM | POA: Diagnosis not present

## 2021-07-31 DIAGNOSIS — R279 Unspecified lack of coordination: Secondary | ICD-10-CM | POA: Diagnosis not present

## 2021-07-31 DIAGNOSIS — G2581 Restless legs syndrome: Secondary | ICD-10-CM | POA: Diagnosis not present

## 2021-08-01 DIAGNOSIS — G2581 Restless legs syndrome: Secondary | ICD-10-CM | POA: Diagnosis not present

## 2021-08-01 DIAGNOSIS — R2681 Unsteadiness on feet: Secondary | ICD-10-CM | POA: Diagnosis not present

## 2021-08-01 DIAGNOSIS — M19012 Primary osteoarthritis, left shoulder: Secondary | ICD-10-CM | POA: Diagnosis not present

## 2021-08-01 DIAGNOSIS — R279 Unspecified lack of coordination: Secondary | ICD-10-CM | POA: Diagnosis not present

## 2021-08-01 DIAGNOSIS — M19011 Primary osteoarthritis, right shoulder: Secondary | ICD-10-CM | POA: Diagnosis not present

## 2021-08-04 DIAGNOSIS — R2681 Unsteadiness on feet: Secondary | ICD-10-CM | POA: Diagnosis not present

## 2021-08-04 DIAGNOSIS — M19012 Primary osteoarthritis, left shoulder: Secondary | ICD-10-CM | POA: Diagnosis not present

## 2021-08-04 DIAGNOSIS — M19011 Primary osteoarthritis, right shoulder: Secondary | ICD-10-CM | POA: Diagnosis not present

## 2021-08-04 DIAGNOSIS — R279 Unspecified lack of coordination: Secondary | ICD-10-CM | POA: Diagnosis not present

## 2021-08-04 DIAGNOSIS — G2581 Restless legs syndrome: Secondary | ICD-10-CM | POA: Diagnosis not present

## 2021-08-05 DIAGNOSIS — G2581 Restless legs syndrome: Secondary | ICD-10-CM | POA: Diagnosis not present

## 2021-08-05 DIAGNOSIS — M19011 Primary osteoarthritis, right shoulder: Secondary | ICD-10-CM | POA: Diagnosis not present

## 2021-08-05 DIAGNOSIS — R2681 Unsteadiness on feet: Secondary | ICD-10-CM | POA: Diagnosis not present

## 2021-08-05 DIAGNOSIS — R279 Unspecified lack of coordination: Secondary | ICD-10-CM | POA: Diagnosis not present

## 2021-08-05 DIAGNOSIS — M19012 Primary osteoarthritis, left shoulder: Secondary | ICD-10-CM | POA: Diagnosis not present

## 2021-08-19 ENCOUNTER — Encounter (HOSPITAL_COMMUNITY): Payer: Self-pay

## 2021-08-19 ENCOUNTER — Emergency Department (HOSPITAL_COMMUNITY)
Admission: EM | Admit: 2021-08-19 | Discharge: 2021-08-19 | Disposition: A | Discharge status: Home / Self Care | Payer: Medicare PPO | Attending: Emergency Medicine | Admitting: Emergency Medicine

## 2021-08-19 ENCOUNTER — Other Ambulatory Visit: Payer: Self-pay

## 2021-08-19 DIAGNOSIS — Z20822 Contact with and (suspected) exposure to covid-19: Secondary | ICD-10-CM | POA: Diagnosis not present

## 2021-08-19 DIAGNOSIS — F332 Major depressive disorder, recurrent severe without psychotic features: Secondary | ICD-10-CM | POA: Diagnosis not present

## 2021-08-19 DIAGNOSIS — F419 Anxiety disorder, unspecified: Secondary | ICD-10-CM | POA: Diagnosis not present

## 2021-08-19 DIAGNOSIS — R4689 Other symptoms and signs involving appearance and behavior: Secondary | ICD-10-CM

## 2021-08-19 DIAGNOSIS — R45851 Suicidal ideations: Secondary | ICD-10-CM | POA: Insufficient documentation

## 2021-08-19 DIAGNOSIS — Z046 Encounter for general psychiatric examination, requested by authority: Secondary | ICD-10-CM | POA: Insufficient documentation

## 2021-08-19 DIAGNOSIS — R9431 Abnormal electrocardiogram [ECG] [EKG]: Secondary | ICD-10-CM | POA: Diagnosis not present

## 2021-08-19 DIAGNOSIS — R45850 Homicidal ideations: Secondary | ICD-10-CM | POA: Insufficient documentation

## 2021-08-19 DIAGNOSIS — R456 Violent behavior: Secondary | ICD-10-CM | POA: Diagnosis not present

## 2021-08-19 LAB — RAPID URINE DRUG SCREEN, HOSP PERFORMED
Amphetamines: NOT DETECTED
Barbiturates: NOT DETECTED
Benzodiazepines: NOT DETECTED
Cocaine: NOT DETECTED
Opiates: NOT DETECTED
Tetrahydrocannabinol: NOT DETECTED

## 2021-08-19 LAB — CBC
HCT: 32 % — ABNORMAL LOW (ref 36.0–46.0)
Hemoglobin: 10.3 g/dL — ABNORMAL LOW (ref 12.0–15.0)
MCH: 30.7 pg (ref 26.0–34.0)
MCHC: 32.2 g/dL (ref 30.0–36.0)
MCV: 95.2 fL (ref 80.0–100.0)
Platelets: 222 10*3/uL (ref 150–400)
RBC: 3.36 MIL/uL — ABNORMAL LOW (ref 3.87–5.11)
RDW: 13.6 % (ref 11.5–15.5)
WBC: 10.2 10*3/uL (ref 4.0–10.5)
nRBC: 0 % (ref 0.0–0.2)

## 2021-08-19 LAB — COMPREHENSIVE METABOLIC PANEL
ALT: 14 U/L (ref 0–44)
AST: 19 U/L (ref 15–41)
Albumin: 4 g/dL (ref 3.5–5.0)
Alkaline Phosphatase: 70 U/L (ref 38–126)
Anion gap: 8 (ref 5–15)
BUN: 34 mg/dL — ABNORMAL HIGH (ref 8–23)
CO2: 22 mmol/L (ref 22–32)
Calcium: 9.7 mg/dL (ref 8.9–10.3)
Chloride: 109 mmol/L (ref 98–111)
Creatinine, Ser: 1.16 mg/dL — ABNORMAL HIGH (ref 0.44–1.00)
GFR, Estimated: 46 mL/min — ABNORMAL LOW (ref >60)
Glucose, Bld: 110 mg/dL — ABNORMAL HIGH (ref 70–99)
Potassium: 3.9 mmol/L (ref 3.5–5.1)
Sodium: 139 mmol/L (ref 135–145)
Total Bilirubin: 0.3 mg/dL (ref 0.3–1.2)
Total Protein: 7.7 g/dL (ref 6.5–8.1)

## 2021-08-19 LAB — URINALYSIS, ROUTINE W REFLEX MICROSCOPIC
Bilirubin Urine: NEGATIVE
Glucose, UA: NEGATIVE mg/dL
Hgb urine dipstick: NEGATIVE
Ketones, ur: NEGATIVE mg/dL
Leukocytes,Ua: NEGATIVE
Nitrite: NEGATIVE
Protein, ur: NEGATIVE mg/dL
Specific Gravity, Urine: 1.013 (ref 1.005–1.030)
pH: 5 (ref 5.0–8.0)

## 2021-08-19 LAB — SALICYLATE LEVEL: Salicylate Lvl: <7 mg/dL — ABNORMAL LOW (ref 7.0–30.0)

## 2021-08-19 LAB — RESP PANEL BY RT-PCR (FLU A&B, COVID) ARPGX2
Influenza A by PCR: NEGATIVE
Influenza B by PCR: NEGATIVE
SARS Coronavirus 2 by RT PCR: NEGATIVE

## 2021-08-19 LAB — ACETAMINOPHEN LEVEL: Acetaminophen (Tylenol), Serum: 10 ug/mL (ref 10–30)

## 2021-08-19 LAB — ETHANOL: Alcohol, Ethyl (B): <10 mg/dL (ref <10)

## 2021-08-19 MED ORDER — ADULT MULTIVITAMIN W/MINERALS CH
1.0000 | ORAL_TABLET | Freq: Every day | ORAL | Status: DC
  Administered 2021-08-19: 1 via ORAL
  Filled 2021-08-19: qty 1

## 2021-08-19 MED ORDER — FERROUS SULFATE 325 (65 FE) MG PO TABS
325.0000 mg | ORAL_TABLET | Freq: Every day | ORAL | Status: DC
  Administered 2021-08-19: 325 mg via ORAL
  Filled 2021-08-19: qty 1

## 2021-08-19 MED ORDER — IRBESARTAN 75 MG PO TABS
75.0000 mg | ORAL_TABLET | Freq: Every day | ORAL | Status: DC
  Administered 2021-08-19: 75 mg via ORAL
  Filled 2021-08-19: qty 1

## 2021-08-19 MED ORDER — ROPINIROLE HCL 0.25 MG PO TABS
0.2500 mg | ORAL_TABLET | Freq: Every day | ORAL | Status: DC
  Filled 2021-08-19: qty 1

## 2021-08-19 MED ORDER — PANTOPRAZOLE SODIUM 40 MG PO TBEC
40.0000 mg | DELAYED_RELEASE_TABLET | Freq: Every day | ORAL | Status: DC
  Administered 2021-08-19: 40 mg via ORAL
  Filled 2021-08-19: qty 1

## 2021-08-19 MED ORDER — VITAMIN B-12 100 MCG PO TABS
100.0000 ug | ORAL_TABLET | Freq: Every day | ORAL | Status: DC
  Administered 2021-08-19: 100 ug via ORAL
  Filled 2021-08-19: qty 1

## 2021-08-19 MED ORDER — GABAPENTIN 300 MG PO CAPS: 300.0000 mg | ORAL_CAPSULE | Freq: Every day | ORAL | Status: DC

## 2021-08-19 MED ORDER — VALSARTAN-HYDROCHLOROTHIAZIDE 80-12.5 MG PO TABS: 1.0000 | ORAL_TABLET | Freq: Every day | ORAL | Status: DC

## 2021-08-19 MED ORDER — TRAMADOL HCL 50 MG PO TABS
100.0000 mg | ORAL_TABLET | Freq: Four times a day (QID) | ORAL | Status: DC
  Administered 2021-08-19: 100 mg via ORAL
  Filled 2021-08-19: qty 2

## 2021-08-19 MED ORDER — HYDROCHLOROTHIAZIDE 12.5 MG PO TABS
12.5000 mg | ORAL_TABLET | Freq: Every day | ORAL | Status: DC
  Administered 2021-08-19: 12.5 mg via ORAL

## 2021-08-19 MED ORDER — DULOXETINE HCL 20 MG PO CPEP
20.0000 mg | ORAL_CAPSULE | Freq: Two times a day (BID) | ORAL | Status: DC
  Filled 2021-08-19: qty 1

## 2021-08-19 MED ORDER — CITALOPRAM HYDROBROMIDE 10 MG PO TABS
20.0000 mg | ORAL_TABLET | Freq: Every day | ORAL | Status: DC
  Administered 2021-08-19: 20 mg via ORAL
  Filled 2021-08-19: qty 2

## 2021-08-19 MED ORDER — ACETAMINOPHEN 500 MG PO TABS: 1000.0000 mg | ORAL_TABLET | Freq: Four times a day (QID) | ORAL | Status: DC | PRN

## 2021-08-19 NOTE — ED Provider Notes (Signed)
Urinalysis and UDS are benign.  Home meds have been ordered after med rec completed.  Stable for psychiatric disposition   Sherwood Gambler, MD 08/19/21 [phone]

## 2021-08-19 NOTE — ED Notes (Signed)
Pt NAD, a/ox4. Pt and daughter verbalizes understanding of all DC and f/u instructions. All questions answered. Pt walks with walker with daughter to lobby at DC.

## 2021-08-19 NOTE — ED Triage Notes (Signed)
Pt BIB police with IVC paperwork. Pt has been suicidal x 2 weeks. Pt told her great granddaughter that she was going to kill her yesterday. Pt has a hx of overdosing on her medications in the past. Pt has experienced 3 deaths over the past year including her husband, daughter, and son in law.

## 2021-08-19 NOTE — ED Provider Notes (Signed)
[facility] DEPT Provider Note   CSN: [id number] Arrival date & time: 08/19/21  0125     History  Chief Complaint  Patient presents with   IVC   Suicidal   Homicidal    Brandy [name] is a 88 y.o. female.  The history is provided by the patient and medical records.  Brandy [name] is a 88 y.o. female who presents to the Emergency Department complaining of IVC.  She presents to the emergency department under IVC by her granddaughter for evaluation of suicidal and homicidal thoughts.  Per patient she lives at home and brought her granddaughter into the home in August to help her with tasks around the house such as cooking.  She states that since her granddaughter has been there and they get into verbal altercations and today they got into a verbal altercation where she said she would kill the granddaughter.  She states that she gets angry at times and she states that this is just a statement and she does not truly intend to act on these thoughts.  She does also report passive SI without current plan.  She has lost both her husband and daughter in the last year.  Per IVC patient has been threatening to kill petitioner or kill herself if she gets the chance.  She states that no one cares about her and she rather be dead.  A few months ago she overdosed on her medications.    Home Medications Prior to Admission medications   Medication Sig Start Date End Date Taking? Authorizing Provider  metoprolol tartrate (LOPRESSOR) 50 MG tablet Take 1 tablet (50 mg total) by mouth once for 1 dose. Take on 11/16 at 9am prior to your CT scans to slow your heart rate. 05/12/21 05/12/21  Eileen [name], PA-C  acetaminophen (TYLENOL) 500 MG tablet Take 1,000 mg by mouth every 6 (six) hours as needed (pain).    [provider]  alendronate (FOSAMAX) 70 MG tablet Take 70 mg by mouth once a week. sunday 04/01/21   [provider]  atorvastatin (LIPITOR) 40 MG  tablet Take 40 mg by mouth daily.  04/27/16   [provider]  Cyanocobalamin (VITAMIN B-12 PO) Take 1 tablet by mouth daily.    [provider]  DULoxetine (CYMBALTA) 20 MG capsule Take 20 mg by mouth 2 (two) times daily. 04/17/21   [provider]  ferrous sulfate 325 (65 FE) MG tablet Take 1 tablet (325 mg total) by mouth daily. 05/09/21   Jennye Boroughs, MD  gabapentin (NEURONTIN) 300 MG capsule TAKE 1 CAPSULE BY MOUTH EVERYDAY AT BEDTIME 11/08/20   Nicholas Lose, MD  Multiple Vitamin (MULTIVITAMIN WITH MINERALS) TABS tablet Take 1 tablet by mouth daily.    [provider]  omeprazole (PRILOSEC) 40 MG capsule Take 1 capsule (40 mg total) by mouth daily as needed (acid reflux). 05/09/21   Jennye Boroughs, MD  traMADol (ULTRAM) 50 MG tablet Take 2 tablets (100 mg total) by mouth 4 (four) times daily. 11/16/18   Edmisten, Kristie L, PA  valsartan-hydrochlorothiazide (DIOVAN-HCT) 80-12.5 MG tablet Take 1 tablet by mouth daily.    [provider]  Vitamin D, Ergocalciferol, (DRISDOL) 50000 units CAPS capsule Take 50,000 Units by mouth every 7 (seven) days. Sunday    [provider]      Allergies    Patient has no known allergies.    Review of Systems   Review of Systems  All other systems  reviewed and are negative.  Physical Exam Updated Vital Signs BP 127/61 (BP Location: Right Arm)    Pulse 95    Temp 99.1 F (37.3 C) (Oral)    Resp 17    Ht 5\' 1"  (1.549 m)    Wt 58.5 kg    SpO2 96%    BMI 24.37 kg/m  Physical Exam Vitals and nursing note reviewed.  Constitutional:      Appearance: She is well-developed.  HENT:     Head: Normocephalic and atraumatic.  Cardiovascular:     Rate and Rhythm: Normal rate and regular rhythm.     Heart sounds: Murmur heard.  Pulmonary:     Effort: Pulmonary effort is normal. No respiratory distress.     Breath sounds: Normal breath sounds.  Abdominal:     Palpations: Abdomen is soft.     Tenderness:  There is no abdominal tenderness. There is no guarding or rebound.  Musculoskeletal:        General: No swelling or tenderness.  Skin:    General: Skin is warm and dry.  Neurological:     Mental Status: She is alert and oriented to person, place, and time.  Psychiatric:     Comments: Depressed mood and affect.  Normal content.    ED Results / Procedures / Treatments   Labs (all labs ordered are listed, but only abnormal results are displayed) Labs Reviewed  RESP PANEL BY RT-PCR (FLU A&B, COVID) [id number]  COMPREHENSIVE METABOLIC PANEL  ETHANOL  SALICYLATE LEVEL  ACETAMINOPHEN LEVEL  CBC  RAPID URINE DRUG SCREEN, HOSP PERFORMED    EKG EKG Interpretation  Date/Time:  Tuesday August 19 2021 02:27:54 EST Ventricular Rate:  96 PR Interval:  158 QRS Duration: 106 QT Interval:  357 QTC Calculation: 452 R Axis:   68 Text Interpretation: Sinus rhythm Ventricular premature complex Probable left ventricular hypertrophy Anterior Q waves, possibly due to LVH Confirmed by Quintella Reichert [phone]) on 08/19/2021 2:45:15 AM  Radiology No results found.  Procedures Procedures    Medications Ordered in ED Medications - No data to display  ED Course/ Medical Decision Making/ A&P                           Medical Decision Making Amount and/or Complexity of Data Reviewed Labs: ordered.   Patient here under IVC by granddaughter for suicidal and homicidal thoughts.  Patient is calm and appropriate on ED assessment.  She does report stating that she wanted to kill her granddaughter but states that she had no intention of doing so.  She states that this was done in the heat of the moment.  She does report passive SI.  First exam completed for patient's safety.  Labs with mild renal insufficiency.  On review in epic this appears stable compared to December 2022.  She has been medically cleared for psychiatric evaluation and treatment.        Final Clinical Impression(s) / ED  Diagnoses Final diagnoses:  None    Rx / DC Orders ED Discharge Orders     None         Quintella Reichert, MD 08/19/21 [phone]

## 2021-08-19 NOTE — ED Provider Notes (Signed)
°  Physical Exam  BP (!) 128/55 (BP Location: Left Arm)    Pulse 77    Temp 99.1 F (37.3 C) (Oral)    Resp 18    Ht 5\' 1"  (1.549 m)    Wt 58.5 kg    SpO2 93%    BMI 24.37 kg/m   Physical Exam  Procedures  Procedures  ED Course / MDM    Medical Decision Making Care assumed at 3 PM.  Patient was cleared by psychiatry.  I examined her and she is not suicidal homicidal.  I rescinded her IVC.  Psychiatry recommend social work consult and I placed a social work consult.  She may benefit from a assisted living.  At this point, she is stable for discharge and can be placed outpatient.  We reviewed her labs and they are unremarkable.  Amount and/or Complexity of Data Reviewed Labs: ordered. Decision-making details documented in ED Course.  Risk OTC drugs. Prescription drug management.          Drenda Freeze, MD 08/19/21 Tresa Moore

## 2021-08-19 NOTE — ED Notes (Signed)
Per care team, pt IVC will be rescinded and pt can DC with daughter home to find outpatient placement/services. Pt on board with this plan of care. Pt daughter arrives. Pt changes into clothing, given back belongings with all accounted for.

## 2021-08-19 NOTE — ED Notes (Addendum)
Pt NAD in bed, a/ox4. Pt denies SI/HI or hallucinations at this time. Denies ETOH or drug use. Pt calm and cooperative, wearing purple scrubs in a psych safe room with sitter at bedside.

## 2021-08-19 NOTE — BH Assessment (Addendum)
Comprehensive Clinical Assessment (CCA) Note  08/19/2021 Brandy [name] [id number]  Disposition: TTS assessment completed. Discussed clinicals with Brandy Loveless, DNP, and patient is psych cleared pending collateral information from the grand daughter/petitioner, Brandy [name] [phone]. Collateral information was obtained and patient remains psych cleared. Dr. Darl Householder, to  rescind the IVC. Patient may benefit from Assisted Living options. As well as, outpatient therapy and continued med management with current PCP...Marland KitchenMarland KitchenMarland Kitchenor a psychiatrist in a the community. Patient's nurse and TOC provided updates. TOC to assist with disposition updates.  *Patient provided verbal consent to obtain collateral from her great grand daughter, Brandy [name] [phone]. Clinician called the grand daughter @ [phone], no answer; left a HIPPA compliant voicemail.   *@[email or web address]  @ re-contacted great grandmother, Brandy [name]. States that she moved in September 2022, after patient's spouse/her great grandfather passed away. Says that patient has a lot of issues related to her own mother dying when she was younger. The family feels that she often uses this as a excuse for her behaviors. Also, the fact that she didn't have a great relationship with her now deceased spouse and patient reportedly uses this as a excuse too. Lattie Haw further says that patient is often upset that she will not take patient places, every day. The great daughter has over 20 recordings, as far back as 2020, recordings of patient stating she is suicidal. Also, reports that patient overdosed on Tramadol 6 months ago. However, doesn't know if he overdose was intentional or not. Additionally, patient threatened to harm her great grand daughter in the past. She reportedly told her great grand daughter September, 2021, "Go get your gun so I can shoot myself and you". Last night told her grand daughter she was going to hit her and kill her.   Her MPOA is patient's  daughter Brandy [name]) [phone]  and says that  she is also aware of patients behaviors. Lattie Haw would like this Clinician to provide disposition updates to [facility].     [place] ED from 08/19/2021 in [facility] [facility]PT ED to Hosp-Admission (Discharged) from 05/05/2021 in [facility] ED from 11/19/2020 in [place] [facility] Dept  C-SSRS RISK CATEGORY No Risk No Risk No Risk      The patient demonstrates the following risk factors for suicide: Chronic risk factors for suicide include: psychiatric disorder of Major Depressive Disorder, Recurrent, Severe, w/o psychotic features and Anxiety Disorder  . Acute risk factors for suicide include: family or marital conflict and loss (financial, interpersonal, professional). Protective factors for this patient include:  n/a . Considering these factors, the overall suicide risk at this point appears to be "No Risk". Patient is appropriate for outpatient follow up, pending psych clearance.   Chief Complaint:  Chief Complaint  Patient presents with   IVC   Suicidal   Homicidal   Psychiatric Evaluation   Visit Diagnosis: Major Depressive Disorder, Recurrent, Severe, w/o psychotic features and Anxiety Disorder   Brandy [name] is an 88 y.o. female transported to [facility] by [employer]. She was asked what brought her to the Emergency Department and states, I can't talk about it, I've had a rough year, my daughter passed away, my husband passed away too. Patient is very tearful stating, I can't take it much longer. Her daughter passed away 11-10-19; spouse passed away 05/11/2020.   Patient says she lives in [place], in her home. Her grand daughter lives in the home with her and moved in  right before my husband passed away. Granddaughter helps with laundry and cooking. Says that her granddaughter is supposed to give her baths but doesn't.  Says that she is dependent upon  her recreational therapist whom visits her 2x's per week and helps with baths. Patient says that she doesn't get along with her granddaughter. States, She called me a liar last night, I was on the phone talking to my friend, and after I hung up my grand daughter called me a Control and instrumentation engineer. Says that they argue mostly on the weekends and usually it's over nothing. She says that her grand daughter drinks beer, invites friends over, and when the friends are over she tries to show off, show them that she is the boss.   Patient denies current suicidal ideations. She admits that she has made suicidal comments in the past out of anger but denies any intention to end her life. She last made suicidal comments, last night. Says that she made the comments only after her grand daughter started arguing with her, jumped in my face saying hit me hit me. Patient denies that she would ever hit her grand daughter but feels that her grand daughter was trying to provoke her. Denies hx of suicide attempts and/or gestures. Protective factor: My religion. Denies hx of self-mutilating behaviors.  Patient with a hx of mild depression. However, says that her symptoms worsened after losing love ones. Current depressive symptoms: hopelessness, worthlessness, tearful, fatigue, loss of interest in usual pleasures, angry/irritable, and insomnia. She sleeps 8 hrs per night. However, awakens frequently throughout the night. Appetite varies. No significant weight loss and/or gain.   Denis hx of trauma and/or abuse. She is unable to identify a support system.   Patient is widowed as of 2022. She had a total of 3 children, 1 passed away. She has a good relationship with other daughters. Highest level of education is the 8th grade. Raised away by father.   Denies HI. Denies hx of aggressive and/or assaultive behaviors. Denies AVH's. Denies alcohol and/or drug use. Denies hx of inpatient psychiatric treatment. Currently seeing a therapist,  however; doesn't know the name of the provider. She is currently taking medications to assist with her depression, prescribed by Kathyrn Lass, MD, PCP at [facility]. She is compliant with her medications.   Patient is alert and oriented x4. Dressed in purple scrubs. Speech is normal. Insight and judgement are poor. Impulse control is appropriate. Mood is depressed.   Patient provided verbal consent to obtain collateral from her grand daughter, Brandy [name] [phone]. Clinician called the grand daughter @ [phone], no answer; left a HIPPA compliant voicemail.    CCA Screening, Triage and Referral (STR)  Patient Reported Information How did you hear about [place]? No data recorded What Is the Reason for Your Visit/Call Today? SHAVONN CONVEY is an 88 y.o. female transported to [facility] by [employer]. She was asked what brought her to the Emergency Department and states, I cant talk about it, Donnald Garre had a rough year, my daughter passed away, my husband passed away too. Patient is very tearful stating, I cant take it much longer. Her daughter passed away 2019-10-21; spouse passed away 04/21/2020.     Patient says she lives in [place], in her home. Her grand daughter lives in the home with her and moved in right before my husband passed away. Granddaughter helps with laundry and cooking. Says that her granddaughter is supposed to give her baths but doesnt.  Says that she  is dependent upon her recreational therapist whom visits her 2xs per week and helps with baths. Patient says that she doesnt get along with her granddaughter. States, She called me a liar last night, I was on the phone talking to my friend, and after I hung up my grand daughter called me a Control and instrumentation engineer. Says that they argue mostly on the weekends and usually its over nothing. She says that her grand daughter drinks beer, invites friends over, and when the friends are over she tries to show off, show them that she is the boss.     Patient  denies current suicidal ideations. She admits that she has made suicidal comments in the past out of anger but denies any intention to end her life. She last made suicidal comments, last night. Says that she made the comments only after her grand daughter started arguing with her, jumped in my face saying hit me hit me. Patient denies that she would ever hit her grand daughter but feels that her grand daughter was trying to provoke her. Denies hx of suicide attempts and/or gestures. Protective factor: My religion. Denies hx of self-mutilating behaviors.    Patient with a hx of mild depression. However, says that her symptoms worsened after losing love ones. Current depressive symptoms: hopelessness, worthlessness, tearful, fatigue, loss of interest in usual pleasures, angry/irritable, and insomnia. She sleeps 8 hrs per night. However, awakens frequently throughout the night. Appetite varies. No significant weight loss and/or gain.   Denis hx of trauma and/or abuse. She is unable to identify a support system.     Patient is widowed as of 2022. She had a total of 3 children, 1 passed away. She has a good relationship with other daughters. Highest level of education is the 8th grade. Raised away by father.     Denies HI.  How Long Has This Been Causing You Problems? > than 6 months  What Do You Feel Would Help You the Most Today? Treatment for Depression or other mood problem; Stress Management; Medication(s)   Have You Recently Had Any Thoughts About Hurting Yourself? Yes  Are You Planning to Commit Suicide/Harm Yourself At This time? No   Have you Recently Had Thoughts About [place]? No  Are You Planning to Harm Someone at This Time? No  Explanation: No data recorded  Have You Used Any Alcohol or Drugs in the Past 24 Hours? No  How Long Ago Did You Use Drugs or Alcohol? No data recorded What Did You Use and How Much? No data recorded  Do You Currently Have a  Therapist/Psychiatrist? Yes  Name of Therapist/Psychiatrist: Currently seeing a therapist, however; doesnt know the name of the provider. She is currently taking medications to assist with her depression, prescribed by Kathyrn Lass, MD, PCP at [facility]. She is compliant with her medications.   Have You Been Recently Discharged From Any Office Practice or Programs? No  Explanation of Discharge From Practice/Program: No data recorded    CCA Screening Triage Referral Assessment Type of Contact: Tele-Assessment  Telemedicine Service Delivery: Telemedicine service delivery: This service was provided via telemedicine using a 2-way, interactive audio and video technology  Is this Initial or Reassessment? No data recorded Date Telepsych consult ordered in [facility]:  No data recorded Time Telepsych consult ordered in [facility]:  No data recorded Location of Assessment: [facility]  Provider Location: Other (comment) ([facility])   Collateral Involvement: No data recorded  Does Patient Have a [place]?  No data recorded Name and Contact of Legal Guardian: No data recorded If Minor and Not Living with Parent(s), Who has Custody? No data recorded Is CPS involved or ever been involved? Never  Is APS involved or ever been involved? Never   Patient Determined To Be At Risk for Harm To Self or Others Based on Review of Patient Reported Information or Presenting Complaint? No  Method: No data recorded Availability of Means: No data recorded Intent: No data recorded Notification Required: No data recorded Additional Information for Danger to Others Potential: No data recorded Additional Comments for Danger to Others Potential: No data recorded Are There Guns or Other Weapons in Your Home? No data recorded Types of Guns/Weapons: No data recorded Are These Weapons Safely Secured?                            No data recorded Who Could Verify You Are Able To Have These Secured: No data  recorded Do You Have any Outstanding Charges, Pending Court Dates, Parole/Probation? No data recorded Contacted To Inform of Risk of Harm To Self or Others: No data recorded   Does Patient Present under Involuntary Commitment? No  IVC Papers Initial File Date: No data recorded  [place] of Residence: [place]   Patient Currently Receiving the Following Services: Individual Therapy; Medication Management (Med management by PCP)   Determination of Need: Urgent (48 hours)   Options For Referral: No data recorded    CCA Biopsychosocial Patient Reported Schizophrenia/Schizoaffective Diagnosis in Past: No   Strengths: No data recorded  Mental Health Symptoms Depression:   Fatigue; Difficulty Concentrating; Hopelessness   Duration of Depressive symptoms:  Duration of Depressive Symptoms: Greater than two weeks   Mania:   None   Anxiety:   No data recorded  Psychosis:   None   Duration of Psychotic symptoms:    Trauma:   None   Obsessions:   None   Compulsions:   None   Inattention:   None   Hyperactivity/Impulsivity:   None   Oppositional/Defiant Behaviors:   None   Emotional Irregularity:  No data recorded  Other Mood/Personality Symptoms:  No data recorded   Mental Status Exam Appearance and self-care  Stature:   Average   Weight:   Average weight   Clothing:   Neat/clean   Grooming:   Normal   Cosmetic use:   Age appropriate   Posture/gait:   Normal   Motor activity:   Not Remarkable   Sensorium  Attention:   Normal   Concentration:   Anxiety interferes   Orientation:   Object; Person; Place; Situation; Time   Recall/memory:   Normal   Affect and Mood  Affect:   Depressed; Anxious   Mood:   Anxious   Relating  Eye contact:   Normal   Facial expression:   Depressed; Sad   Attitude toward examiner:   Cooperative   Thought and Language  Speech flow:  Clear and Coherent   Thought content:   Appropriate to  Mood and Circumstances   Preoccupation:   None   Hallucinations:   None   Organization:  No data recorded  [employer] of Knowledge:   Average   Intelligence:   Average   Abstraction:   Normal   Judgement:   Good   Reality Testing:   Adequate   Insight:   Good   Decision Making:   Normal   Social  Functioning  Social Maturity:   Isolates   Social Judgement:   Normal   Stress  Stressors:   Family conflict   Coping Ability:   Normal   Skill Deficits:   Interpersonal   Supports:   Other (Comment) ("I don't have any")     Religion: Religion/Spirituality Are You A Religious Person?: No  Leisure/Recreation: Leisure / Recreation Do You Have Hobbies?: No  Exercise/Diet: Exercise/Diet Do You Exercise?: No Have You Gained or Lost A Significant Amount of Weight in the Past Six Months?: No Do You Follow a Special Diet?: No Do You Have Any Trouble Sleeping?: No   CCA Employment/Education Employment/Work Situation: Employment / Work Situation Employment Situation: Unemployed Patient's Job has Been Impacted by Current Illness: No Has Patient ever Been in Passenger transport manager?: No  Education: Education Is Patient Currently Attending School?: No Last Grade Completed:  (8th grade) Did You Attend College?: No Did You Have An Individualized Education Program (IIEP): No Did You Have Any Difficulty At School?: No Patient's Education Has Been Impacted by Current Illness: No   CCA Family/Childhood History Family and Relationship History: Family history Marital status: Single Does patient have children?: Yes How many children?:  (3) How is patient's relationship with their children?: Gave birth to 3 children; 1 deceased; says she gets along with children  Childhood History:  Childhood History By whom was/is the patient raised?: Father Did patient suffer any verbal/emotional/physical/sexual abuse as a child?: No Did patient suffer from  severe childhood neglect?: No Has patient ever been sexually abused/assaulted/raped as an adolescent or adult?: No Was the patient ever a victim of a crime or a disaster?: No Witnessed domestic violence?: No Has patient been affected by domestic violence as an adult?: No  Child/Adolescent Assessment:     CCA Substance Use Alcohol/Drug Use: Alcohol / Drug Use Pain Medications: SEE MAR Prescriptions: SEE MAR Over the Counter: SEE MAR History of alcohol / drug use?: No history of alcohol / drug abuse Longest period of sobriety (when/how long): N/A                         ASAM's:  Six Dimensions of Multidimensional Assessment  Dimension 1:  Acute Intoxication and/or Withdrawal Potential:      Dimension 2:  Biomedical Conditions and Complications:      Dimension 3:  Emotional, Behavioral, or Cognitive Conditions and Complications:     Dimension 4:  Readiness to Change:     Dimension 5:  Relapse, Continued use, or Continued Problem Potential:     Dimension 6:  Recovery/Living Environment:     ASAM Severity Score:    ASAM Recommended Level of Treatment:     Substance use Disorder (SUD)    Recommendations for Services/Supports/Treatments: Recommendations for Services/Supports/Treatments Recommendations For Services/Supports/Treatments: Individual Therapy, Medication Management  Discharge Disposition:    DSM5 Diagnoses: Patient Active Problem List   Diagnosis Date Noted   Syncope and collapse    PNA (pneumonia) 05/06/2021   Acute on chronic respiratory failure with hypoxemia ([place]) 05/05/2021   Hip fracture ([place]) 11/11/2018   Acute respiratory failure with hypoxia ([place]) 10/29/2018   S/P right hip fracture 10/24/2018   Acute on chronic diastolic CHF (congestive heart failure) ([place]) 01/19/2017   HCAP (healthcare-associated pneumonia) 12/11/2016   S/P exploratory laparotomy 11/17/2016   Small bowel obstruction ([place]) 11/17/2016   Pre-operative cardiovascular  examination    Incarcerated incisional hernia s/p SB resection & repair 11/17/2016 11/15/2016   Acute  renal failure (ARF) ([place]) 11/15/2016   Hyperglycemia 11/15/2016   Asthma without status asthmaticus 09/24/2016   Carpal tunnel syndrome 09/24/2016   Asthma 09/24/2016   Dark stools 09/24/2016   Degenerative arthritis of lumbar spine 09/24/2016   DJD (degenerative joint disease), cervical 09/24/2016   Dyspnea on exertion 09/24/2016   Generalized anxiety disorder 09/24/2016   Lumbosacral spondylosis without myelopathy 09/24/2016   Morbid (severe) obesity due to excess calories ([place]) 09/24/2016   Pernicious anemia 09/24/2016   Pure hypercholesterolemia 09/24/2016   Right knee pain 09/24/2016   Sleep disorder 09/24/2016   Vitamin B 12 deficiency 09/24/2016   Vitamin D deficiency 09/24/2016   Gastric ulcer    Iron deficiency anemia due to chronic blood loss    Abdominal pain, chronic, epigastric    Acute gastric ulcer without hemorrhage or perforation    Duodenal ulcer    Closed fracture of nasal septum 08/27/2015   Closed fracture of zygomatic arch ([place]) 08/27/2015   Protein-calorie malnutrition, severe ([place]) 12/18/2013   Choledocholithiasis 12/17/2013   Sepsis ([place]) 12/17/2013   Osteopenia 11/17/2012   Aortic valve stenosis 10/17/2012   Hx of radiation therapy    Depression    History of kidney stones    Urinary urgency    Gastroesophageal reflux disease without esophagitis    Hyperlipidemia    Essential hypertension    Bronchitis    Arthritis    Dysphagia    H/O hiatal hernia    Primary cancer of upper outer quadrant of left female breast ([place]) 12/01/2011     Referrals to Alternative Service(s): Referred to Alternative Service(s):   Place:   Date:   Time:    Referred to Alternative Service(s):   Place:   Date:   Time:    Referred to Alternative Service(s):   Place:   Date:   Time:    Referred to Alternative Service(s):   Place:   Date:   Time:     Waldon Merl,  Counselor

## 2021-08-19 NOTE — Discharge Instructions (Addendum)
I ordered home health and social work to help you to consider assisted living options  Continue current meds  Follow-up with your primary care doctor  Return to ER if you have thoughts of harming yourself or others or hallucinations

## 2021-08-19 NOTE — ED Notes (Signed)
Pts knives (5) and fingernail clippers (2) given to security.

## 2021-08-19 NOTE — ED Notes (Signed)
Pt wanded by security. 

## 2021-08-19 NOTE — BH Assessment (Addendum)
@  1215, requested patients nurse [name], RN) to place the TTS machine in patient's room.  Informed that patient's nurse is (Dylan, EMT), he was also notified that this Clinician is ready to see patient.

## 2021-08-19 NOTE — ED Notes (Signed)
Pt is sleeping no signs of distress.  

## 2021-08-19 NOTE — Progress Notes (Signed)
.Transition of Care [facility]) - Emergency Department Mini Assessment   Patient Details  Name: Brandy [name] MRN: [id number] Date of Birth: 07-22-1932  Transition of [facility]) CM/SW Contact:    Illene Regulus, LCSW Phone Number: 08/19/2021, 6:28 PM   Clinical Narrative: TOC CSW received consult in regards to ALF and OP Therapy resource. CSW spoke with pt's daughter Carole Civil. Pt's daughter agreed for referral to be sent out to [facility] to assist with AFL placement in the community setting. Pt's daughter requested OP therapy resources as well. CSW attached OP counseling resources to pt's AVS. Pt's daughter stated she is here to pick pt up upon d/c. TOC sign off.    ED Mini Assessment: What brought you to the Emergency Department? : IVC  Barriers to Discharge: No Barriers Identified     Means of departure: Car  Interventions which prevented an admission or readmission: Patient counseling    Patient Contact and Communications        ,                 Admission diagnosis:  IVC Patient Active Problem List   Diagnosis Date Noted   Syncope and collapse    PNA (pneumonia) 05/06/2021   Acute on chronic respiratory failure with hypoxemia ([place]) 05/05/2021   Hip fracture ([place]) 11/11/2018   Acute respiratory failure with hypoxia ([place]) 10/29/2018   S/P right hip fracture 10/24/2018   Acute on chronic diastolic CHF (congestive heart failure) ([place]) 01/19/2017   HCAP (healthcare-associated pneumonia) 12/11/2016   S/P exploratory laparotomy 11/17/2016   Small bowel obstruction ([place]) 11/17/2016   Pre-operative cardiovascular examination    Incarcerated incisional hernia s/p SB resection & repair 11/17/2016 11/15/2016   Acute renal failure (ARF) ([place]) 11/15/2016   Hyperglycemia 11/15/2016   Asthma without status asthmaticus 09/24/2016   Carpal tunnel syndrome 09/24/2016   Asthma 09/24/2016   Dark stools 09/24/2016   Degenerative arthritis of lumbar spine  09/24/2016   DJD (degenerative joint disease), cervical 09/24/2016   Dyspnea on exertion 09/24/2016   Generalized anxiety disorder 09/24/2016   Lumbosacral spondylosis without myelopathy 09/24/2016   Morbid (severe) obesity due to excess calories ([place]) 09/24/2016   Pernicious anemia 09/24/2016   Pure hypercholesterolemia 09/24/2016   Right knee pain 09/24/2016   Sleep disorder 09/24/2016   Vitamin B 12 deficiency 09/24/2016   Vitamin D deficiency 09/24/2016   Gastric ulcer    Iron deficiency anemia due to chronic blood loss    Abdominal pain, chronic, epigastric    Acute gastric ulcer without hemorrhage or perforation    Duodenal ulcer    Closed fracture of nasal septum 08/27/2015   Closed fracture of zygomatic arch ([place]) 08/27/2015   Protein-calorie malnutrition, severe ([place]) 12/18/2013   Choledocholithiasis 12/17/2013   Sepsis ([place]) 12/17/2013   Osteopenia 11/17/2012   Aortic valve stenosis 10/17/2012   Hx of radiation therapy    Depression    History of kidney stones    Urinary urgency    Gastroesophageal reflux disease without esophagitis    Hyperlipidemia    Essential hypertension    Bronchitis    Arthritis    Dysphagia    H/O hiatal hernia    Primary cancer of upper outer quadrant of left female breast ([place]) 12/01/2011   PCP:  Kathyrn Lass, MD Pharmacy:   [id number] - [place], [address] [place] [address] Phone: [phone] Fax:  [phone] °  °

## 2021-08-19 NOTE — ED Notes (Signed)
Provided pt's daughter with an update.

## 2021-08-19 NOTE — ED Notes (Signed)
Wallet, clothing, phone, glasses, charger in personal belongings bag under triage personal belongings space.

## 2021-09-03 DIAGNOSIS — F411 Generalized anxiety disorder: Secondary | ICD-10-CM | POA: Diagnosis not present

## 2021-09-03 DIAGNOSIS — D649 Anemia, unspecified: Secondary | ICD-10-CM | POA: Diagnosis not present

## 2021-09-03 DIAGNOSIS — N1831 Chronic kidney disease, stage 3a: Secondary | ICD-10-CM | POA: Diagnosis not present

## 2021-09-03 DIAGNOSIS — R2681 Unsteadiness on feet: Secondary | ICD-10-CM | POA: Diagnosis not present

## 2021-10-13 DIAGNOSIS — N1831 Chronic kidney disease, stage 3a: Secondary | ICD-10-CM | POA: Diagnosis not present

## 2021-12-09 DIAGNOSIS — R32 Unspecified urinary incontinence: Secondary | ICD-10-CM | POA: Diagnosis not present

## 2021-12-09 DIAGNOSIS — D649 Anemia, unspecified: Secondary | ICD-10-CM | POA: Diagnosis not present

## 2021-12-09 DIAGNOSIS — F341 Dysthymic disorder: Secondary | ICD-10-CM | POA: Diagnosis not present

## 2021-12-09 DIAGNOSIS — N1831 Chronic kidney disease, stage 3a: Secondary | ICD-10-CM | POA: Diagnosis not present

## 2021-12-15 ENCOUNTER — Telehealth: Payer: Self-pay | Admitting: Hematology and Oncology

## 2021-12-15 NOTE — Telephone Encounter (Signed)
Scheduled appt per 6/12 referral. Pt was already established with Dr. Lindi Adie. Called pt, no answer. Left msg with appt date and time.

## 2022-01-16 ENCOUNTER — Inpatient Hospital Stay: Payer: Medicare PPO

## 2022-01-16 ENCOUNTER — Other Ambulatory Visit: Payer: Self-pay

## 2022-01-16 ENCOUNTER — Inpatient Hospital Stay: Payer: Medicare PPO | Attending: Hematology and Oncology | Admitting: Hematology and Oncology

## 2022-01-16 DIAGNOSIS — Z9012 Acquired absence of left breast and nipple: Secondary | ICD-10-CM | POA: Insufficient documentation

## 2022-01-16 DIAGNOSIS — C50412 Malignant neoplasm of upper-outer quadrant of left female breast: Secondary | ICD-10-CM

## 2022-01-16 DIAGNOSIS — N189 Chronic kidney disease, unspecified: Secondary | ICD-10-CM | POA: Insufficient documentation

## 2022-01-16 DIAGNOSIS — I10 Essential (primary) hypertension: Secondary | ICD-10-CM | POA: Diagnosis not present

## 2022-01-16 DIAGNOSIS — E785 Hyperlipidemia, unspecified: Secondary | ICD-10-CM | POA: Insufficient documentation

## 2022-01-16 DIAGNOSIS — J45909 Unspecified asthma, uncomplicated: Secondary | ICD-10-CM | POA: Diagnosis not present

## 2022-01-16 DIAGNOSIS — K219 Gastro-esophageal reflux disease without esophagitis: Secondary | ICD-10-CM | POA: Diagnosis not present

## 2022-01-16 DIAGNOSIS — Z87442 Personal history of urinary calculi: Secondary | ICD-10-CM | POA: Diagnosis not present

## 2022-01-16 DIAGNOSIS — Z923 Personal history of irradiation: Secondary | ICD-10-CM | POA: Diagnosis not present

## 2022-01-16 DIAGNOSIS — Z9221 Personal history of antineoplastic chemotherapy: Secondary | ICD-10-CM | POA: Insufficient documentation

## 2022-01-16 DIAGNOSIS — R42 Dizziness and giddiness: Secondary | ICD-10-CM | POA: Diagnosis not present

## 2022-01-16 DIAGNOSIS — Z853 Personal history of malignant neoplasm of breast: Secondary | ICD-10-CM | POA: Insufficient documentation

## 2022-01-16 DIAGNOSIS — D631 Anemia in chronic kidney disease: Secondary | ICD-10-CM | POA: Diagnosis not present

## 2022-01-16 DIAGNOSIS — D649 Anemia, unspecified: Secondary | ICD-10-CM | POA: Insufficient documentation

## 2022-01-16 DIAGNOSIS — I129 Hypertensive chronic kidney disease with stage 1 through stage 4 chronic kidney disease, or unspecified chronic kidney disease: Secondary | ICD-10-CM | POA: Insufficient documentation

## 2022-01-16 LAB — CBC WITH DIFFERENTIAL (CANCER CENTER ONLY)
Abs Immature Granulocytes: 0.03 10*3/uL (ref 0.00–0.07)
Basophils Absolute: 0 10*3/uL (ref 0.0–0.1)
Basophils Relative: 0 %
Eosinophils Absolute: 0.2 10*3/uL (ref 0.0–0.5)
Eosinophils Relative: 2 %
HCT: 30 % — ABNORMAL LOW (ref 36.0–46.0)
Hemoglobin: 9.5 g/dL — ABNORMAL LOW (ref 12.0–15.0)
Immature Granulocytes: 0 %
Lymphocytes Relative: 24 %
Lymphs Abs: 1.9 10*3/uL (ref 0.7–4.0)
MCH: 29.2 pg (ref 26.0–34.0)
MCHC: 31.7 g/dL (ref 30.0–36.0)
MCV: 92.3 fL (ref 80.0–100.0)
Monocytes Absolute: 0.5 10*3/uL (ref 0.1–1.0)
Monocytes Relative: 7 %
Neutro Abs: 5.1 10*3/uL (ref 1.7–7.7)
Neutrophils Relative %: 67 %
Platelet Count: 192 10*3/uL (ref 150–400)
RBC: 3.25 MIL/uL — ABNORMAL LOW (ref 3.87–5.11)
RDW: 14.4 % (ref 11.5–15.5)
WBC Count: 7.7 10*3/uL (ref 4.0–10.5)
nRBC: 0 % (ref 0.0–0.2)

## 2022-01-16 LAB — LACTATE DEHYDROGENASE: LDH: 164 U/L (ref 98–192)

## 2022-01-16 LAB — DIRECT ANTIGLOBULIN TEST (NOT AT ARMC)
DAT, IgG: NEGATIVE
DAT, complement: NEGATIVE

## 2022-01-16 MED ORDER — GABAPENTIN 300 MG PO CAPS: 600.0000 mg | ORAL_CAPSULE | Freq: Every day | ORAL | 3 refills | Status: AC

## 2022-01-16 NOTE — Progress Notes (Signed)
[place] NOTE  Patient Care Team: Kathyrn Lass, MD as PCP - General Donato Heinz, MD as PCP - Cardiology (Cardiology) Early Osmond, MD as PCP - Structural Heart (Cardiology)  CHIEF COMPLAINTS/PURPOSE OF CONSULTATION:  Consult for anemia  HISTORY OF PRESENTING ILLNESS:  Brandy [name] 88 y.o. female is here because of recent diagnosis of anemia.  She has a prior history of breast cancer diagnosed in 2013 treated with mastectomy adjuvant chemotherapy radiation and 5 years of letrozole that was completed in 2018.  She has had anemia going on for several years.  Most recent blood work seem to show a hemoglobin of 9.1 and iron studies and B31 and folic acid were all normal.  Because of this she was referred to [place] for further evaluation. She does complain of dizziness lightheadedness shortness of breath exertion.  She walks with the help of a walker.  She lives independently but her daughter helps her with all activities of daily living. I reviewed her records extensively and collaborated the history with the patient.  SUMMARY OF ONCOLOGIC HISTORY: Oncology History  Primary cancer of upper outer quadrant of left female breast ([place])  12/18/2011 Surgery   Left mastectomy: 2 foci of well-differentiated invasive ductal carcinoma 1.7 cm, 0.7 cm with DCIS, lymphovascular invasion present, one positive SLN with ECS, ER/PR positive, HER-2 negative, Ki-67 13%; 0.7 cm tumor: ER 100, PR 7% Her 2Pos 4.94, Ki 50%   02/09/2012 - 02/06/2013 Chemotherapy   Adjuvant chemotherapy Taxol Herceptin X 10 discontinued due to intolerance followed by Herceptin maintenance   04/26/2012 - 06/10/2012 Radiation Therapy   Adjuvant radiation therapy   06/10/2012 - 04/06/2017 Anti-estrogen oral therapy   Letrozole 2.5 mg daily   11/15/2016 - 11/25/2016 Hospital Admission   Incarcerated incisional hernia status post small bowel resection and repair 11/17/2016      MEDICAL HISTORY:  Past  Medical History:  Diagnosis Date   Anemia    Arthritis    shoulders   Asthma    mild, no inhalers used   Breast cancer ([place]) 12/18/11   left breast masectomy=metastatic ca in (1/1) lymph node ,invasive ductal ca,2 foci,,dcis,lymph ovascular invasion identified,surgical resection margins neg for ca,additional tissue=benign skin and subcutaneous tissue   Bronchitis    hx of;last time >56yrago   Depression    takes Celexa daily   Difficult intubation    Dysphagia    Full dentures    Gall stones 2015   GERD (gastroesophageal reflux disease)    takes Prilosec prn   H/O hiatal hernia    History of kidney stones    many yrs ago   Hx of radiation therapy 04/26/12 -06/10/12   left breast   Hyperlipidemia    takes Simvastatin daily   Hypertension    takes Amlodipine and Diovan daily   Insomnia    takes Ambien nightly   Urinary urgency     SURGICAL HISTORY: Past Surgical History:  Procedure Laterality Date   APPENDECTOMY     bladder tack     BREAST BIOPSY  1998   left    DILATION AND CURETTAGE OF UTERUS     ENDOSCOPIC RETROGRADE CHOLANGIOPANCREATOGRAPHY (ERCP) WITH PROPOFOL N/A 02/01/2014   Procedure: ENDOSCOPIC RETROGRADE CHOLANGIOPANCREATOGRAPHY (ERCP) WITH PROPOFOL;  Surgeon: DMilus Banister MD;  Location: [facility];  Service: Endoscopy;  Laterality: N/A;   ESOPHAGOGASTRODUODENOSCOPY     ESOPHAGOGASTRODUODENOSCOPY N/A 06/26/2016   Procedure: ESOPHAGOGASTRODUODENOSCOPY (EGD);  Surgeon: DMilus Banister MD;  [facility];  Service: Endoscopy;  Laterality: N/A;   ESOPHAGOGASTRODUODENOSCOPY (EGD) WITH PROPOFOL  12/19/2013   Procedure: ESOPHAGOGASTRODUODENOSCOPY (EGD) WITH PROPOFOL;  Surgeon: Beryle Beams, MD;  Location: [facility];  Service: Endoscopy;;   ESOPHAGOGASTRODUODENOSCOPY (EGD) WITH PROPOFOL N/A 09/10/2016   Procedure: ESOPHAGOGASTRODUODENOSCOPY (EGD) WITH PROPOFOL;  Surgeon: [name], MD;  [facility];  Service: Endoscopy;  Laterality:  N/A;   EUS  06/04/2011   Procedure: UPPER ENDOSCOPIC ULTRASOUND (EUS) LINEAR;  Surgeon: Owens Loffler, MD;  [facility];  Service: Endoscopy;  Laterality: N/A;   EUS N/A 02/01/2014   Procedure: UPPER ENDOSCOPIC ULTRASOUND (EUS) LINEAR;  Surgeon: [name], MD;  [facility];  Service: Endoscopy;  Laterality: N/A;   EXPLORATORY LAPAROTOMY      biopsy of intra-abdominal mass   INTRAMEDULLARY (IM) NAIL INTERTROCHANTERIC Right 10/24/2018   Procedure: INTRAMEDULLARY (IM) NAIL INTERTROCHANTRIC;  Surgeon: Gaynelle Arabian, MD;  Location: [facility];  Service: Orthopedics;  Laterality: Right;   LAPAROTOMY N/A 11/17/2016   Procedure: EXPLORATORY LAPAROTOMY WITH LYSIS OF ADHESIONS, SMALL BOWEL RESECTION, REPAIR OF INCARCERATED VENTRAL INCISIONAL HERNIA, INSERTION OF BIOLOGIC MESH PATCH,APPLICATION OF WOUND VAC DRESSING;  Surgeon: Armandina Gemma, MD;  Location: [facility];  Service: General;  Laterality: N/A;   MASTECTOMY W/ SENTINEL NODE BIOPSY  12/18/2011   Procedure: MASTECTOMY WITH SENTINEL LYMPH NODE BIOPSY;  Surgeon: Rolm Bookbinder, MD;  Location: [facility];  Service: General;  Laterality: Left;   PORT-A-CATH REMOVAL Right 06/15/2013   Procedure: REMOVAL PORT-A-CATH;  Surgeon: Rolm Bookbinder, MD;  Location: [facility];  Service: General;  Laterality: Right;   PORTACATH PLACEMENT  01/27/2012   Procedure: INSERTION PORT-A-CATH;  Surgeon: Rolm Bookbinder, MD;  Location: [facility];  Service: General;  Laterality: Right;  PORT PLACEMENT   RIGHT/LEFT HEART CATH AND CORONARY ANGIOGRAPHY N/A 05/08/2021   Procedure: RIGHT/LEFT HEART CATH AND CORONARY ANGIOGRAPHY;  Surgeon: Jettie Booze, MD;  Location: [facility];  Service: Cardiovascular;  Laterality: N/A;   TOTAL HIP ARTHROPLASTY Right 11/14/2018   Procedure: REMOVAL OF INTRAMEDULLARY NAIL AND POSTERIOR TOTAL HIP ARTHROPLASTY;  Surgeon: Gaynelle Arabian, MD;  [facility];  Service: Orthopedics;   Laterality: Right;   TOTAL SHOULDER REPLACEMENT  2011   left   TUBAL LIGATION      SOCIAL HISTORY: Social History   Socioeconomic History   Marital status: Married    Spouse name: Not on file   Number of children: Not on file   Years of education: Not on file   Highest education level: Not on file  Occupational History   Occupation: retired  Tobacco Use   Smoking status: Never   Smokeless tobacco: Never  Vaping Use   Vaping Use: Never used  Substance and Sexual Activity   Alcohol use: No   Drug use: No   Sexual activity: Yes    Birth control/protection: Surgical    Comment: mensus age 40, 16st pregnancy 55, no hrt gg4,p3, 1 babay lived a few hours complications  Other Topics Concern   Not on file  Social History Narrative   Not on file   Social Determinants of Health   Financial Resource Strain: Not on file  Food Insecurity: Unknown (10/29/2018)   Hunger Vital Sign    Worried About Running Out of Food in the Last Year: Patient refused    Ran Out of Food in the Last Year: Patient refused  Transportation Needs: Not on file  Physical Activity: Unknown (10/29/2018)  Exercise Vital Sign    Days of Exercise per Week: Patient refused    Minutes of Exercise per Session: Patient refused  Stress: Not on file  Social Connections: Unknown (10/29/2018)   Social Connection and Isolation Panel [NHANES]    Frequency of Communication with Friends and Family: Patient refused    Frequency of Social Gatherings with Friends and Family: Patient refused    Attends Religious Services: Patient refused    Active Member of Clubs or Organizations: Patient refused    Attends Archivist Meetings: Patient refused    Marital Status: Patient refused  Intimate Partner Violence: Unknown (10/29/2018)   Humiliation, Afraid, Rape, and Kick questionnaire    Fear of Current or Ex-Partner: Patient refused    Emotionally Abused: Patient refused    Physically Abused: Patient refused    Sexually  Abused: Patient refused    FAMILY HISTORY: Family History  Problem Relation Age of Onset   Prostate cancer Father    Breast cancer Other     ALLERGIES:  has No Known Allergies.  MEDICATIONS:  Current Outpatient Medications  Medication Sig Dispense Refill   acetaminophen (TYLENOL) 500 MG tablet Take 1,000 mg by mouth every 6 (six) hours as needed for headache (pain).     alendronate (FOSAMAX) 70 MG tablet Take 70 mg by mouth every Sunday.     Cyanocobalamin (VITAMIN B-12 PO) Take 1 tablet by mouth daily.     DULoxetine (CYMBALTA) 20 MG capsule Take 20 mg by mouth 2 (two) times daily.     ferrous sulfate 325 (65 FE) MG tablet Take 1 tablet (325 mg total) by mouth daily.     gabapentin (NEURONTIN) 300 MG capsule Take 2 capsules (600 mg total) by mouth at bedtime. 90 capsule 3   Multiple Vitamin (MULTIVITAMIN WITH MINERALS) TABS tablet Take 1 tablet by mouth daily.     omeprazole (PRILOSEC) 20 MG capsule Take 20 mg by mouth daily as needed (heartburn/acid reflux).     rOPINIRole (REQUIP) 0.25 MG tablet Take 0.25 mg by mouth at bedtime.     traMADol (ULTRAM) 50 MG tablet Take 2 tablets (100 mg total) by mouth 4 (four) times daily. 40 tablet 0   valsartan-hydrochlorothiazide (DIOVAN-HCT) 80-12.5 MG tablet Take 1 tablet by mouth daily.     Vitamin D, Ergocalciferol, (DRISDOL) 50000 units CAPS capsule Take 50,000 Units by mouth every Sunday.     No current facility-administered medications for this visit.   Facility-Administered Medications Ordered in Other Visits  Medication Dose Route Frequency Provider Last Rate Last Admin   sodium chloride 0.9 % injection 10 mL  10 mL Intracatheter PRN Marcy Panning, MD   10 mL at 03/14/12 1628    REVIEW OF SYSTEMS:   Constitutional: Denies fevers, chills or abnormal night sweats, complains of fatigue  All other systems were reviewed with the patient and are negative.  PHYSICAL EXAMINATION: ECOG PERFORMANCE STATUS: 2 - Symptomatic, <50% confined  to bed  Vitals:   01/16/22 0846  BP: (!) 176/78  Pulse: 86  Resp: 18  Temp: 97.9 F (36.6 C)  SpO2: 99%   Filed Weights   01/16/22 0846  Weight: 137 lb 8 oz (62.4 kg)      LABORATORY DATA:  I have reviewed the data as listed Lab Results  Component Value Date   WBC 10.2 08/19/2021   HGB 10.3 (L) 08/19/2021   HCT 32.0 (L) 08/19/2021   MCV 95.2 08/19/2021   PLT 222 08/19/2021   Lab  Results  Component Value Date   NA 139 08/19/2021   K 3.9 08/19/2021   CL 109 08/19/2021   CO2 22 08/19/2021    RADIOGRAPHIC STUDIES: I have personally reviewed the radiological reports and agreed with the findings in the report.  ASSESSMENT AND PLAN:  Normocytic anemia Lab review 08/19/2021: Hemoglobin 10.3, MCV 95 09/03/2021: Hemoglobin 9.7, MCV 90.2, GFR 44% 12/10/2021: Hemoglobin 9.1, MCV 90.6, creatinine 1.18, GFR 45% calcium 9.2, albumin 3.7 (takes oral iron), ferritin 124, iron saturation 15%, TIBC 251, B12 364  Based on the lab results that suggest that the patient has anemia of chronic kidney disease Treatment plan: 1.  To complete the work-up we will obtain serum protein electrophoresis, hemolysis labs, erythropoietin level 2. consider treatment with Retacrit injections every 3 weeks for anemia of chronic kidney disease with a goal to keep hemoglobin at or above 10.5  Telephone visit in 1 week to discuss the results and come up with a final treatment plan.   All questions were answered. The patient knows to call the clinic with any problems, questions or concerns.    Harriette Ohara, MD 01/16/22

## 2022-01-16 NOTE — Assessment & Plan Note (Signed)
Lab review 08/19/2021: Hemoglobin 10.3, MCV 95 09/03/2021: Hemoglobin 9.7, MCV 90.2, GFR 44% 12/10/2021: Hemoglobin 9.1, MCV 90.6, creatinine 1.18, GFR 45% calcium 9.2, albumin 3.7 (takes oral iron), ferritin 124, iron saturation 15%, TIBC 251, B12 364  Based on the lab results that suggest that the patient has anemia of chronic kidney disease Treatment plan: 1.  To complete the work-up we will obtain serum protein electrophoresis, hemolysis labs, erythropoietin level 2. consider treatment with Retacrit injections every 3 weeks for anemia of chronic kidney disease with a goal to keep hemoglobin at or above 10.5

## 2022-01-17 LAB — HAPTOGLOBIN: Haptoglobin: 218 mg/dL (ref 41–333)

## 2022-01-17 LAB — ERYTHROPOIETIN: Erythropoietin: 15.2 m[IU]/mL (ref 2.6–18.5)

## 2022-01-20 LAB — MULTIPLE MYELOMA PANEL, SERUM
Albumin SerPl Elph-Mcnc: 3.3 g/dL (ref 2.9–4.4)
Albumin/Glob SerPl: 1.1 (ref 0.7–1.7)
Alpha 1: 0.2 g/dL (ref 0.0–0.4)
Alpha2 Glob SerPl Elph-Mcnc: 1 g/dL (ref 0.4–1.0)
B-Globulin SerPl Elph-Mcnc: 1.2 g/dL (ref 0.7–1.3)
Gamma Glob SerPl Elph-Mcnc: 0.6 g/dL (ref 0.4–1.8)
Globulin, Total: 3.1 g/dL (ref 2.2–3.9)
IgA: 115 mg/dL (ref 64–422)
IgG (Immunoglobin G), Serum: 635 mg/dL (ref 586–1602)
IgM (Immunoglobulin M), Srm: 890 mg/dL — ABNORMAL HIGH (ref 26–217)
M Protein SerPl Elph-Mcnc: 0.6 g/dL — ABNORMAL HIGH
Total Protein ELP: 6.4 g/dL (ref 6.0–8.5)

## 2022-01-22 NOTE — Progress Notes (Signed)
HEMATOLOGY-ONCOLOGY TELEPHONE VISIT PROGRESS NOTE  I connected with our patient on 01/23/22 at 12:00 PM EDT by telephone and verified that I am speaking with the correct person using two identifiers.  I discussed the limitations, risks, security and privacy concerns of performing an evaluation and management service by telephone and the availability of in person appointments.  I also discussed with the patient that there may be a patient responsible charge related to this service. The patient expressed understanding and agreed to proceed.   History of Present Illness: Brandy [name] 88 y.o. female is here because of recent diagnosis of anemia. She presents to the clinic today via telephone follow-up.  Oncology History  Primary cancer of upper outer quadrant of left female breast ([place])  12/18/2011 Surgery   Left mastectomy: 2 foci of well-differentiated invasive ductal carcinoma 1.7 cm, 0.7 cm with DCIS, lymphovascular invasion present, one positive SLN with ECS, ER/PR positive, HER-2 negative, Ki-67 13%; 0.7 cm tumor: ER 100, PR 7% Her 2Pos 4.94, Ki 50%   02/09/2012 - 02/06/2013 Chemotherapy   Adjuvant chemotherapy Taxol Herceptin X 10 discontinued due to intolerance followed by Herceptin maintenance   04/26/2012 - 06/10/2012 Radiation Therapy   Adjuvant radiation therapy   06/10/2012 - 04/06/2017 Anti-estrogen oral therapy   Letrozole 2.5 mg daily   11/15/2016 - 11/25/2016 Hospital Admission   Incarcerated incisional hernia status post small bowel resection and repair 11/17/2016     REVIEW OF SYSTEMS:   Constitutional: Denies fevers, chills or abnormal weight loss All other systems were reviewed with the patient and are negative. Observations/Objective:    Assessment Plan:  Normocytic anemia Lab review 08/19/2021: Hemoglobin 10.3, MCV 95 09/03/2021: Hemoglobin 9.7, MCV 90.2, GFR 44% 12/10/2021: Hemoglobin 9.1, MCV 90.6, creatinine 1.18, GFR 45% calcium 9.2, albumin 3.7 (takes oral iron),  ferritin 124, iron saturation 15%, TIBC 251, B12 364 01/17/2020: WBC 7.7, hemoglobin 9.5, platelets 192, ANC 5.1, DAT negative, erythropoietin 15.2, haptoglobin 218, SPEP: 0.6 g of M protein, LDH 164, haptoglobin 218   Based on the lab results that suggest that the patient has anemia of chronic kidney disease However because the presence of 0.6 g of M protein, I recommend that we obtain a bone survey Given her advanced age I did not recommend a bone marrow biopsy at this time.  Telephone visit after the bone survey to discuss results. If the bone survey is normal then we can see her every 3 months for recheck of her labs.    I discussed the assessment and treatment plan with the patient. The patient was provided an opportunity to ask questions and all were answered. The patient agreed with the plan and demonstrated an understanding of the instructions. The patient was advised to call back or seek an in-person evaluation if the symptoms worsen or if the condition fails to improve as anticipated.   I provided 12 minutes of non-face-to-face time during this encounter.  This includes time for charting and coordination of care   Harriette Ohara, MD   I Gardiner Coins am scribing for Dr. Lindi Adie  I have reviewed the above documentation for accuracy and completeness, and I agree with the above.

## 2022-01-23 ENCOUNTER — Inpatient Hospital Stay (HOSPITAL_BASED_OUTPATIENT_CLINIC_OR_DEPARTMENT_OTHER): Payer: Medicare PPO | Admitting: Hematology and Oncology

## 2022-01-23 DIAGNOSIS — D649 Anemia, unspecified: Secondary | ICD-10-CM

## 2022-01-23 DIAGNOSIS — J45909 Unspecified asthma, uncomplicated: Secondary | ICD-10-CM | POA: Diagnosis not present

## 2022-01-23 DIAGNOSIS — D472 Monoclonal gammopathy: Secondary | ICD-10-CM | POA: Diagnosis not present

## 2022-01-23 DIAGNOSIS — M47812 Spondylosis without myelopathy or radiculopathy, cervical region: Secondary | ICD-10-CM | POA: Diagnosis not present

## 2022-01-23 DIAGNOSIS — M810 Age-related osteoporosis without current pathological fracture: Secondary | ICD-10-CM | POA: Diagnosis not present

## 2022-01-23 DIAGNOSIS — M8580 Other specified disorders of bone density and structure, unspecified site: Secondary | ICD-10-CM | POA: Diagnosis not present

## 2022-01-23 DIAGNOSIS — F411 Generalized anxiety disorder: Secondary | ICD-10-CM | POA: Diagnosis not present

## 2022-01-23 DIAGNOSIS — E7800 Pure hypercholesterolemia, unspecified: Secondary | ICD-10-CM | POA: Diagnosis not present

## 2022-01-23 DIAGNOSIS — N1831 Chronic kidney disease, stage 3a: Secondary | ICD-10-CM | POA: Diagnosis not present

## 2022-01-23 DIAGNOSIS — D631 Anemia in chronic kidney disease: Secondary | ICD-10-CM | POA: Diagnosis not present

## 2022-01-23 DIAGNOSIS — I129 Hypertensive chronic kidney disease with stage 1 through stage 4 chronic kidney disease, or unspecified chronic kidney disease: Secondary | ICD-10-CM | POA: Diagnosis not present

## 2022-01-23 NOTE — Assessment & Plan Note (Signed)
Lab review 08/19/2021: Hemoglobin 10.3, MCV 95 09/03/2021: Hemoglobin 9.7, MCV 90.2, GFR 44% 12/10/2021: Hemoglobin 9.1, MCV 90.6, creatinine 1.18, GFR 45% calcium 9.2, albumin 3.7 (takes oral iron), ferritin 124, iron saturation 15%, TIBC 251, B12 364 01/17/2020: WBC 7.7, hemoglobin 9.5, platelets 192, ANC 5.1, DAT negative, erythropoietin 15.2, haptoglobin 218, SPEP: 0.6 g of M protein, LDH 164, haptoglobin 218  Based on the lab results that suggest that the patient has anemia of chronic kidney disease However because the presence of 0.6 g of M protein, I recommend that we obtain a bone survey and a bone marrow biopsy.

## 2022-01-26 ENCOUNTER — Telehealth: Payer: Self-pay | Admitting: Hematology and Oncology

## 2022-01-26 NOTE — Telephone Encounter (Signed)
Scheduled appointment per 7/21 los. Patient is aware.

## 2022-01-30 ENCOUNTER — Ambulatory Visit (HOSPITAL_COMMUNITY)
Admission: RE | Admit: 2022-01-30 | Discharge: 2022-01-30 | Disposition: A | Discharge status: Home / Self Care | Payer: Medicare PPO | Source: Ambulatory Visit | Attending: Hematology and Oncology | Admitting: Hematology and Oncology

## 2022-01-30 DIAGNOSIS — D649 Anemia, unspecified: Secondary | ICD-10-CM | POA: Diagnosis not present

## 2022-01-30 DIAGNOSIS — D472 Monoclonal gammopathy: Secondary | ICD-10-CM | POA: Insufficient documentation

## 2022-01-30 NOTE — Progress Notes (Signed)
HEMATOLOGY-ONCOLOGY TELEPHONE VISIT PROGRESS NOTE  I connected with our patient on 02/03/22 at  8:30 AM EDT by telephone and verified that I am speaking with the correct person using two identifiers.  I discussed the limitations, risks, security and privacy concerns of performing an evaluation and management service by telephone and the availability of in person appointments.  I also discussed with the patient that there may be a patient responsible charge related to this service. The patient expressed understanding and agreed to proceed.   History of Present Illness: Brandy [name] 88 y.o. female is here because of recent diagnosis of anemia. She presents to the clinic today via telephone follow-up.  She connected with me through a telephone visit to discuss results of the bone survey.  Oncology History  Primary cancer of upper outer quadrant of left female breast ([place])  12/18/2011 Surgery   Left mastectomy: 2 foci of well-differentiated invasive ductal carcinoma 1.7 cm, 0.7 cm with DCIS, lymphovascular invasion present, one positive SLN with ECS, ER/PR positive, HER-2 negative, Ki-67 13%; 0.7 cm tumor: ER 100, PR 7% Her 2Pos 4.94, Ki 50%   02/09/2012 - 02/06/2013 Chemotherapy   Adjuvant chemotherapy Taxol Herceptin X 10 discontinued due to intolerance followed by Herceptin maintenance   04/26/2012 - 06/10/2012 Radiation Therapy   Adjuvant radiation therapy   06/10/2012 - 04/06/2017 Anti-estrogen oral therapy   Letrozole 2.5 mg daily   11/15/2016 - 11/25/2016 Hospital Admission   Incarcerated incisional hernia status post small bowel resection and repair 11/17/2016     REVIEW OF SYSTEMS:   Constitutional: Denies fevers, chills or abnormal weight loss All other systems were reviewed with the patient and are negative. Observations/Objective:     Assessment Plan:  Primary cancer of upper outer quadrant of left female breast 08/19/2021: Hemoglobin 10.3, MCV 95 09/03/2021: Hemoglobin 9.7, MCV  90.2, GFR 44% 12/10/2021: Hemoglobin 9.1, MCV 90.6, creatinine 1.18, GFR 45% calcium 9.2, albumin 3.7 (takes oral iron), ferritin 124, iron saturation 15%, TIBC 251, B12 364 01/17/2020: WBC 7.7, hemoglobin 9.5, platelets 192, ANC 5.1, DAT negative, erythropoietin 15.2, haptoglobin 218, SPEP: 0.6 g of M protein, LDH 164, haptoglobin 218 01/31/2022: Bone survey: No lytic or sclerotic lesions noted.    Based on the lab results and bone survey, the likely cause of her anemia is anemia of chronic kidney disease Given her advanced age I did not recommend a bone marrow biopsy at this time.   We can recheck labs every 3 months and if the labs trend downwards then we can initiate erythropoietin stimulation therapy    I discussed the assessment and treatment plan with the patient. The patient was provided an opportunity to ask questions and all were answered. The patient agreed with the plan and demonstrated an understanding of the instructions. The patient was advised to call back or seek an in-person evaluation if the symptoms worsen or if the condition fails to improve as anticipated.   I provided 12 minutes of non-face-to-face time during this encounter.  This includes time for charting and coordination of care   Harriette Ohara, MD   I Gardiner Coins am scribing for Dr. Lindi Adie  I have reviewed the above documentation for accuracy and completeness, and I agree with the above.

## 2022-02-03 ENCOUNTER — Inpatient Hospital Stay: Payer: Medicare PPO | Attending: Hematology and Oncology | Admitting: Hematology and Oncology

## 2022-02-03 DIAGNOSIS — F332 Major depressive disorder, recurrent severe without psychotic features: Secondary | ICD-10-CM | POA: Diagnosis not present

## 2022-02-03 DIAGNOSIS — D649 Anemia, unspecified: Secondary | ICD-10-CM | POA: Diagnosis not present

## 2022-02-03 DIAGNOSIS — C50412 Malignant neoplasm of upper-outer quadrant of left female breast: Secondary | ICD-10-CM

## 2022-02-03 NOTE — Assessment & Plan Note (Signed)
08/19/2021: Hemoglobin 10.3, MCV 95 09/03/2021: Hemoglobin 9.7, MCV 90.2, GFR 44% 12/10/2021: Hemoglobin 9.1, MCV 90.6, creatinine 1.18, GFR 45% calcium 9.2, albumin 3.7 (takes oral iron), ferritin 124, iron saturation 15%, TIBC 251, B12 364 01/17/2020: WBC 7.7, hemoglobin 9.5, platelets 192, ANC 5.1, DAT negative, erythropoietin 15.2, haptoglobin 218, SPEP: 0.6 g of M protein, LDH 164, haptoglobin 218 01/31/2022: Bone survey: No lytic or sclerotic lesions noted.   Based on the lab results and bone survey, the likely cause of her anemia is anemia of chronic kidney disease Given her advanced age I did not recommend a bone marrow biopsy at this time.  We can recheck labs every 3 months and if the labs trend downwards then we can initiate erythropoietin stimulation therapy

## 2022-02-04 ENCOUNTER — Telehealth: Payer: Self-pay | Admitting: Hematology and Oncology

## 2022-02-04 NOTE — Telephone Encounter (Signed)
Scheduled appointment per 8/1 los. Talked with the patients daughter and she is aware of the appointment that was made.

## 2022-02-06 ENCOUNTER — Emergency Department (HOSPITAL_COMMUNITY): Payer: Medicare HMO

## 2022-02-06 ENCOUNTER — Inpatient Hospital Stay: Payer: Medicare PPO | Admitting: Hematology and Oncology

## 2022-02-06 ENCOUNTER — Inpatient Hospital Stay (HOSPITAL_COMMUNITY)
Admission: EM | Admit: 2022-02-06 | Discharge: 2022-02-09 | DRG: 872 | Disposition: A | Discharge status: Home / Self Care | Payer: Medicare HMO | Attending: Internal Medicine | Admitting: Internal Medicine

## 2022-02-06 ENCOUNTER — Other Ambulatory Visit: Payer: Self-pay

## 2022-02-06 DIAGNOSIS — K219 Gastro-esophageal reflux disease without esophagitis: Secondary | ICD-10-CM | POA: Diagnosis present

## 2022-02-06 DIAGNOSIS — Z7982 Long term (current) use of aspirin: Secondary | ICD-10-CM

## 2022-02-06 DIAGNOSIS — G4700 Insomnia, unspecified: Secondary | ICD-10-CM | POA: Diagnosis present

## 2022-02-06 DIAGNOSIS — R400 Somnolence: Secondary | ICD-10-CM | POA: Diagnosis present

## 2022-02-06 DIAGNOSIS — K8050 Calculus of bile duct without cholangitis or cholecystitis without obstruction: Secondary | ICD-10-CM | POA: Diagnosis not present

## 2022-02-06 DIAGNOSIS — I129 Hypertensive chronic kidney disease with stage 1 through stage 4 chronic kidney disease, or unspecified chronic kidney disease: Secondary | ICD-10-CM | POA: Diagnosis present

## 2022-02-06 DIAGNOSIS — R6520 Severe sepsis without septic shock: Secondary | ICD-10-CM | POA: Diagnosis present

## 2022-02-06 DIAGNOSIS — Z01810 Encounter for preprocedural cardiovascular examination: Secondary | ICD-10-CM | POA: Diagnosis not present

## 2022-02-06 DIAGNOSIS — R0902 Hypoxemia: Secondary | ICD-10-CM | POA: Diagnosis present

## 2022-02-06 DIAGNOSIS — A419 Sepsis, unspecified organism: Principal | ICD-10-CM | POA: Diagnosis present

## 2022-02-06 DIAGNOSIS — N281 Cyst of kidney, acquired: Secondary | ICD-10-CM | POA: Diagnosis not present

## 2022-02-06 DIAGNOSIS — I10 Essential (primary) hypertension: Secondary | ICD-10-CM | POA: Diagnosis present

## 2022-02-06 DIAGNOSIS — N179 Acute kidney failure, unspecified: Secondary | ICD-10-CM | POA: Diagnosis present

## 2022-02-06 DIAGNOSIS — G2581 Restless legs syndrome: Secondary | ICD-10-CM | POA: Diagnosis present

## 2022-02-06 DIAGNOSIS — E785 Hyperlipidemia, unspecified: Secondary | ICD-10-CM | POA: Diagnosis present

## 2022-02-06 DIAGNOSIS — F418 Other specified anxiety disorders: Secondary | ICD-10-CM | POA: Diagnosis not present

## 2022-02-06 DIAGNOSIS — K831 Obstruction of bile duct: Secondary | ICD-10-CM | POA: Diagnosis not present

## 2022-02-06 DIAGNOSIS — Z79899 Other long term (current) drug therapy: Secondary | ICD-10-CM | POA: Diagnosis not present

## 2022-02-06 DIAGNOSIS — Z8711 Personal history of peptic ulcer disease: Secondary | ICD-10-CM | POA: Diagnosis not present

## 2022-02-06 DIAGNOSIS — R0789 Other chest pain: Secondary | ICD-10-CM | POA: Diagnosis not present

## 2022-02-06 DIAGNOSIS — K810 Acute cholecystitis: Principal | ICD-10-CM

## 2022-02-06 DIAGNOSIS — K551 Chronic vascular disorders of intestine: Secondary | ICD-10-CM

## 2022-02-06 DIAGNOSIS — I350 Nonrheumatic aortic (valve) stenosis: Secondary | ICD-10-CM | POA: Diagnosis present

## 2022-02-06 DIAGNOSIS — Z743 Need for continuous supervision: Secondary | ICD-10-CM | POA: Diagnosis not present

## 2022-02-06 DIAGNOSIS — K8063 Calculus of gallbladder and bile duct with acute cholecystitis with obstruction: Secondary | ICD-10-CM | POA: Diagnosis present

## 2022-02-06 DIAGNOSIS — I491 Atrial premature depolarization: Secondary | ICD-10-CM | POA: Diagnosis not present

## 2022-02-06 DIAGNOSIS — Z9221 Personal history of antineoplastic chemotherapy: Secondary | ICD-10-CM

## 2022-02-06 DIAGNOSIS — K8020 Calculus of gallbladder without cholecystitis without obstruction: Secondary | ICD-10-CM | POA: Diagnosis not present

## 2022-02-06 DIAGNOSIS — K819 Cholecystitis, unspecified: Secondary | ICD-10-CM

## 2022-02-06 DIAGNOSIS — Z853 Personal history of malignant neoplasm of breast: Secondary | ICD-10-CM | POA: Diagnosis not present

## 2022-02-06 DIAGNOSIS — N178 Other acute kidney failure: Secondary | ICD-10-CM | POA: Diagnosis not present

## 2022-02-06 DIAGNOSIS — I2510 Atherosclerotic heart disease of native coronary artery without angina pectoris: Secondary | ICD-10-CM | POA: Diagnosis not present

## 2022-02-06 DIAGNOSIS — D631 Anemia in chronic kidney disease: Secondary | ICD-10-CM | POA: Diagnosis present

## 2022-02-06 DIAGNOSIS — Z923 Personal history of irradiation: Secondary | ICD-10-CM

## 2022-02-06 DIAGNOSIS — I728 Aneurysm of other specified arteries: Secondary | ICD-10-CM | POA: Diagnosis not present

## 2022-02-06 DIAGNOSIS — K8031 Calculus of bile duct with cholangitis, unspecified, with obstruction: Secondary | ICD-10-CM

## 2022-02-06 DIAGNOSIS — Z96612 Presence of left artificial shoulder joint: Secondary | ICD-10-CM | POA: Diagnosis present

## 2022-02-06 DIAGNOSIS — F0390 Unspecified dementia without behavioral disturbance: Secondary | ICD-10-CM | POA: Diagnosis not present

## 2022-02-06 DIAGNOSIS — Z8042 Family history of malignant neoplasm of prostate: Secondary | ICD-10-CM

## 2022-02-06 DIAGNOSIS — Z903 Acquired absence of stomach [part of]: Secondary | ICD-10-CM | POA: Diagnosis not present

## 2022-02-06 DIAGNOSIS — K8051 Calculus of bile duct without cholangitis or cholecystitis with obstruction: Secondary | ICD-10-CM | POA: Diagnosis not present

## 2022-02-06 DIAGNOSIS — Z96641 Presence of right artificial hip joint: Secondary | ICD-10-CM | POA: Diagnosis present

## 2022-02-06 DIAGNOSIS — N1831 Chronic kidney disease, stage 3a: Secondary | ICD-10-CM | POA: Diagnosis present

## 2022-02-06 DIAGNOSIS — R1013 Epigastric pain: Secondary | ICD-10-CM | POA: Diagnosis not present

## 2022-02-06 DIAGNOSIS — J45909 Unspecified asthma, uncomplicated: Secondary | ICD-10-CM | POA: Diagnosis not present

## 2022-02-06 DIAGNOSIS — J9811 Atelectasis: Secondary | ICD-10-CM | POA: Diagnosis not present

## 2022-02-06 DIAGNOSIS — R079 Chest pain, unspecified: Secondary | ICD-10-CM | POA: Diagnosis not present

## 2022-02-06 DIAGNOSIS — F32A Depression, unspecified: Secondary | ICD-10-CM | POA: Diagnosis present

## 2022-02-06 DIAGNOSIS — I119 Hypertensive heart disease without heart failure: Secondary | ICD-10-CM | POA: Diagnosis not present

## 2022-02-06 DIAGNOSIS — Z803 Family history of malignant neoplasm of breast: Secondary | ICD-10-CM

## 2022-02-06 LAB — BASIC METABOLIC PANEL
Anion gap: 8 (ref 5–15)
BUN: 22 mg/dL (ref 8–23)
CO2: 25 mmol/L (ref 22–32)
Calcium: 9.1 mg/dL (ref 8.9–10.3)
Chloride: 106 mmol/L (ref 98–111)
Creatinine, Ser: 1.39 mg/dL — ABNORMAL HIGH (ref 0.44–1.00)
GFR, Estimated: 37 mL/min — ABNORMAL LOW (ref >60)
Glucose, Bld: 115 mg/dL — ABNORMAL HIGH (ref 70–99)
Potassium: 4.3 mmol/L (ref 3.5–5.1)
Sodium: 139 mmol/L (ref 135–145)

## 2022-02-06 LAB — HEPATIC FUNCTION PANEL
ALT: 22 U/L (ref 0–44)
AST: 36 U/L (ref 15–41)
Albumin: 3.2 g/dL — ABNORMAL LOW (ref 3.5–5.0)
Alkaline Phosphatase: 78 U/L (ref 38–126)
Bilirubin, Direct: 0.3 mg/dL — ABNORMAL HIGH (ref 0.0–0.2)
Indirect Bilirubin: 0.5 mg/dL (ref 0.3–0.9)
Total Bilirubin: 0.8 mg/dL (ref 0.3–1.2)
Total Protein: 6.3 g/dL — ABNORMAL LOW (ref 6.5–8.1)

## 2022-02-06 LAB — CBC WITH DIFFERENTIAL/PLATELET
Abs Immature Granulocytes: 0.05 10*3/uL (ref 0.00–0.07)
Basophils Absolute: 0 10*3/uL (ref 0.0–0.1)
Basophils Relative: 0 %
Eosinophils Absolute: 0.2 10*3/uL (ref 0.0–0.5)
Eosinophils Relative: 2 %
HCT: 28.4 % — ABNORMAL LOW (ref 36.0–46.0)
Hemoglobin: 9 g/dL — ABNORMAL LOW (ref 12.0–15.0)
Immature Granulocytes: 0 %
Lymphocytes Relative: 26 %
Lymphs Abs: 2.9 10*3/uL (ref 0.7–4.0)
MCH: 29.5 pg (ref 26.0–34.0)
MCHC: 31.7 g/dL (ref 30.0–36.0)
MCV: 93.1 fL (ref 80.0–100.0)
Monocytes Absolute: 0.7 10*3/uL (ref 0.1–1.0)
Monocytes Relative: 6 %
Neutro Abs: 7.2 10*3/uL (ref 1.7–7.7)
Neutrophils Relative %: 66 %
Platelets: 188 10*3/uL (ref 150–400)
RBC: 3.05 MIL/uL — ABNORMAL LOW (ref 3.87–5.11)
RDW: 14.6 % (ref 11.5–15.5)
WBC: 11.1 10*3/uL — ABNORMAL HIGH (ref 4.0–10.5)
nRBC: 0 % (ref 0.0–0.2)

## 2022-02-06 LAB — LIPASE, BLOOD: Lipase: 25 U/L (ref 11–51)

## 2022-02-06 LAB — BRAIN NATRIURETIC PEPTIDE: B Natriuretic Peptide: 167.1 pg/mL — ABNORMAL HIGH (ref 0.0–100.0)

## 2022-02-06 LAB — TROPONIN I (HIGH SENSITIVITY)
Troponin I (High Sensitivity): 36 ng/L — ABNORMAL HIGH (ref <18)
Troponin I (High Sensitivity): 41 ng/L — ABNORMAL HIGH (ref <18)

## 2022-02-06 MED ORDER — ONDANSETRON HCL 4 MG/2ML IJ SOLN
4.0000 mg | Freq: Once | INTRAMUSCULAR | Status: AC
  Administered 2022-02-06: 4 mg via INTRAVENOUS
  Filled 2022-02-06: qty 2

## 2022-02-06 MED ORDER — IOHEXOL 350 MG/ML SOLN
75.0000 mL | Freq: Once | INTRAVENOUS | Status: AC | PRN
Start: 2022-02-06 — End: 2022-02-06
  Administered 2022-02-06: 75 mL via INTRAVENOUS

## 2022-02-06 MED ORDER — NITROGLYCERIN 0.4 MG SL SUBL: 0.4000 mg | SUBLINGUAL_TABLET | SUBLINGUAL | Status: DC | PRN

## 2022-02-06 MED ORDER — FENTANYL CITRATE PF 50 MCG/ML IJ SOSY
50.0000 ug | PREFILLED_SYRINGE | Freq: Once | INTRAMUSCULAR | Status: AC
  Administered 2022-02-06: 50 ug via INTRAVENOUS
  Filled 2022-02-06: qty 1

## 2022-02-06 MED ORDER — NITROGLYCERIN 0.4 MG SL SUBL
0.4000 mg | SUBLINGUAL_TABLET | Freq: Once | SUBLINGUAL | Status: AC
  Administered 2022-02-06: 0.4 mg via SUBLINGUAL
  Filled 2022-02-06: qty 1

## 2022-02-06 MED ORDER — LACTATED RINGERS IV BOLUS
500.0000 mL | Freq: Once | INTRAVENOUS | Status: AC
Start: 2022-02-06 — End: 2022-02-06
  Administered 2022-02-06: 500 mL via INTRAVENOUS

## 2022-02-06 MED ORDER — PIPERACILLIN-TAZOBACTAM 3.375 G IVPB 30 MIN
3.3750 g | Freq: Once | INTRAVENOUS | Status: AC
  Administered 2022-02-06: 3.375 g via INTRAVENOUS
  Filled 2022-02-06: qty 50

## 2022-02-06 MED ORDER — MORPHINE SULFATE (PF) 2 MG/ML IV SOLN
2.0000 mg | Freq: Once | INTRAVENOUS | Status: AC
  Administered 2022-02-06: 2 mg via INTRAVENOUS
  Filled 2022-02-06: qty 1

## 2022-02-06 NOTE — ED Notes (Signed)
RN captured a new EKG on patient due to new changed noted on the monitor. EKG given to ED provider. No new orders at this time.

## 2022-02-06 NOTE — ED Notes (Signed)
Patient reports no relief from pain meds nor antiemetics. Patient states "I'm going to vomit. It's what I need" and proceeds to insert a finger into mouth to self provoke vomit.   ED provider made aware.

## 2022-02-06 NOTE — ED Triage Notes (Signed)
BIB EMS for sudden crushing chest pain while at rest also reports slight dyspnea. Pt cc of n/v and continued chest pain.   Pt received 324 ASA and 3xNitro with little relief.

## 2022-02-06 NOTE — ED Notes (Signed)
Patient requested to go to restroom with assistance. Tolerated well, pt noted to have significant increased SOB on return to room. RN advised patient the need to minimize ambulating around due to dyspnea on exertion. Patient verbalized understanding.

## 2022-02-06 NOTE — ED Notes (Signed)
Pain improved, resting, and asleep. Patient's oxygen levels noted to drop to low 84% RA. Placed on oxygen 2L [place].  ED Provider made aware.

## 2022-02-06 NOTE — ED Provider Notes (Signed)
[facility] DEPARTMENT Provider Note   CSN: [id number] Arrival date & time: 02/06/22  1912     History  Chief Complaint  Patient presents with   Chest Pain   Shortness of Breath    Brandy [name] is a 88 y.o. female PMH breast cancer s/p mastectomy, HTN who is presenting with chest pain.  Patient brought in by EMS, who contribute to the history.  They report that the patient was sitting in a chair earlier this evening, when she noticed sudden onset crushing chest pain.  Patient has been hypertensive and intermittently tachycardic in route.  Patient has been given aspirin 324 and 3 nitro, with mild improvement in her chest pain.  Currently, patient reports chest pain is "starting to improve", but is still present.  She localizes it to the left side of her chest, extending down towards her stomach.  She reports some of the pain going to her back.  She denies any shortness of breath.  Notes nausea, but denies vomiting, diaphoresis, diarrhea.  Denies any recent sick symptoms including fever, chills, cough, congestion, dysuria, hematuria.   Chest Pain Associated symptoms: shortness of breath   Shortness of Breath Associated symptoms: chest pain        Home Medications Prior to Admission medications   Medication Sig Start Date End Date Taking? Authorizing Provider  acetaminophen (TYLENOL) 500 MG tablet Take 1,000 mg by mouth every 6 (six) hours as needed for headache (pain).    [provider]  alendronate (FOSAMAX) 70 MG tablet Take 70 mg by mouth every Sunday. 04/01/21   [provider]  Cyanocobalamin (VITAMIN B-12 PO) Take 1 tablet by mouth daily.    [provider]  DULoxetine (CYMBALTA) 20 MG capsule Take 20 mg by mouth 2 (two) times daily. 04/17/21   [provider]  ferrous sulfate 325 (65 FE) MG tablet Take 1 tablet (325 mg total) by mouth daily. 05/09/21   Jennye Boroughs, MD  gabapentin (NEURONTIN) 300 MG capsule  Take 2 capsules (600 mg total) by mouth at bedtime. 01/16/22   Nicholas Lose, MD  Multiple Vitamin (MULTIVITAMIN WITH MINERALS) TABS tablet Take 1 tablet by mouth daily.    [provider]  omeprazole (PRILOSEC) 20 MG capsule Take 20 mg by mouth daily as needed (heartburn/acid reflux).    [provider]  rOPINIRole (REQUIP) 0.25 MG tablet Take 0.25 mg by mouth at bedtime. 07/28/21   [provider]  traMADol (ULTRAM) 50 MG tablet Take 2 tablets (100 mg total) by mouth 4 (four) times daily. 11/16/18   Edmisten, Kristie L, PA  valsartan-hydrochlorothiazide (DIOVAN-HCT) 80-12.5 MG tablet Take 1 tablet by mouth daily.    [provider]  Vitamin D, Ergocalciferol, (DRISDOL) 50000 units CAPS capsule Take 50,000 Units by mouth every Sunday.    [provider]      Allergies    Patient has no known allergies.    Review of Systems   Review of Systems  Respiratory:  Positive for shortness of breath.   Cardiovascular:  Positive for chest pain.    Physical Exam Updated Vital Signs BP (!) 184/75 (BP Location: Right Arm)   Pulse 95   Temp 98.5 F (36.9 C) (Oral)   Resp (!) 22   SpO2 96%  Physical Exam Vitals and nursing note reviewed.  Constitutional:      General: She is not in acute distress.    Appearance: Normal appearance. She is well-developed. She is not  ill-appearing or diaphoretic.     Comments: Uncomfortable appearing female sitting upright in bed.  HENT:     Head: Normocephalic and atraumatic.     Right Ear: External ear normal.     Left Ear: External ear normal.     Nose: Nose normal.     Mouth/Throat:     Mouth: Mucous membranes are moist.  Cardiovascular:     Rate and Rhythm: Regular rhythm. Tachycardia present.     Pulses:          Radial pulses are 2+ on the right side and 2+ on the left side.       Dorsalis pedis pulses are 2+ on the right side and 2+ on the left side.     Heart sounds: Murmur heard.     Systolic murmur is  present.  Pulmonary:     Effort: Pulmonary effort is normal. No tachypnea or respiratory distress.     Breath sounds: Normal breath sounds. No wheezing.  Abdominal:     General: There is no distension.     Palpations: Abdomen is soft.     Tenderness: There is no abdominal tenderness. There is no guarding.     Comments: Well-healed surgical scar present.  Musculoskeletal:     Cervical back: Neck supple.     Right lower leg: No edema.     Left lower leg: No edema.  Skin:    General: Skin is warm and dry.  Neurological:     Mental Status: She is alert.     ED Results / Procedures / Treatments   Labs (all labs ordered are listed, but only abnormal results are displayed) Labs Reviewed  CBC WITH DIFFERENTIAL/PLATELET  BASIC METABOLIC PANEL  BRAIN NATRIURETIC PEPTIDE  TROPONIN I (HIGH SENSITIVITY)    EKG None  Radiology No results found.  Procedures Procedures    Medications Ordered in ED Medications  ondansetron (ZOFRAN) injection 4 mg (has no administration in time range)  fentaNYL (SUBLIMAZE) injection 50 mcg (has no administration in time range)  nitroGLYCERIN (NITROSTAT) SL tablet 0.4 mg (has no administration in time range)    ED Course/ Medical Decision Making/ A&P                           Medical Decision Making Amount and/or Complexity of Data Reviewed Labs: ordered. Radiology: ordered.  Risk Prescription drug management.   Brandy [name] is a 88 y.o. female PMH breast cancer s/p mastectomy, HTN who is presenting with chest pain radiating towards her stomach and back.   Vitals at presentation notable for hypertension and tachycardia.  Patient is hemodynamically stable, afebrile, satting well on room air.  Physical exam reassuring.  Normal heart sounds.  Lungs clear to auscultation bilaterally.  Abdomen is soft, nontender to palpation.  No pitting edema.  Initial differential includes but is not limited to: ACS, NSTEMI, STEMI, aortic dissection,  pneumonia, pneumothorax, peptic ulcer disease, abdominal pathology (pancreatitis, cholecystitis)  Lab work obtained, resulted notable for troponin 36.  Repeat 41.  BNP mildly elevated at 167.  CBC with leukocytosis 11.1.  Hemoglobin 9, not significantly different from prior BMP reveals creatinine 1.39, not significantly changed from prior.  Lipase and hepatic function panel have been obtained and pending.  Chest x-ray obtained, independently interpreted by me.  Does not reveal significant pleural effusion or pneumothorax, with some evidence of cardiomegaly.  Per radiology, "Cardiomegaly without acute abnormality of the lungs".  Given patient  is hypertensive, tachycardic, with crushing chest pain that radiates below the diaphragm, I was initially concerned for aortic dissection and ordered a CT angio chest/abdomen/pelvis.  Per radiology read, this resulted negative for aortic dissection, but notable for acute cholecystitis and "interval development of marked intra and extrahepatic biliary ductal dilation at the level of the ampulla" without obvious obstructing lesion identified.  Patient given IV zosyn.  Patient also provided with IV Zofran, nitroglycerin, IV fentanyl, IV morphine for pain.  Patient provided IV fluid bolus.  EKG obtained, demonstrates sinus rhythm, ventricular rate 126 bpm.  No evidence of abnormal intervals.  No evidence of ST elevations or acute ischemia.  Interpreted by myself and my attending.  We will plan to admit the patient for acute cholecystitis, IV antibiotics, IV fluids, IV analgesia, and potential operative management.  The plan for this patient was discussed with Dr. Reubin Milan, who voiced agreement and who oversaw evaluation and treatment of this patient.    Final Clinical Impression(s) / ED Diagnoses Final diagnoses:  Acute cholecystitis    Rx / DC Orders ED Discharge Orders     None

## 2022-02-07 ENCOUNTER — Other Ambulatory Visit (HOSPITAL_COMMUNITY): Payer: Medicare PPO

## 2022-02-07 ENCOUNTER — Inpatient Hospital Stay (HOSPITAL_COMMUNITY): Payer: Medicare HMO | Admitting: Certified Registered"

## 2022-02-07 ENCOUNTER — Encounter (HOSPITAL_COMMUNITY): Payer: Self-pay | Admitting: Internal Medicine

## 2022-02-07 ENCOUNTER — Inpatient Hospital Stay (HOSPITAL_COMMUNITY): Payer: Medicare HMO

## 2022-02-07 ENCOUNTER — Encounter (HOSPITAL_COMMUNITY)
Admission: EM | Disposition: A | Discharge status: Home / Self Care | Payer: Self-pay | Source: Home / Self Care | Attending: Internal Medicine

## 2022-02-07 DIAGNOSIS — Z96612 Presence of left artificial shoulder joint: Secondary | ICD-10-CM | POA: Diagnosis present

## 2022-02-07 DIAGNOSIS — Z9221 Personal history of antineoplastic chemotherapy: Secondary | ICD-10-CM | POA: Diagnosis not present

## 2022-02-07 DIAGNOSIS — I10 Essential (primary) hypertension: Secondary | ICD-10-CM

## 2022-02-07 DIAGNOSIS — R400 Somnolence: Secondary | ICD-10-CM | POA: Diagnosis present

## 2022-02-07 DIAGNOSIS — A419 Sepsis, unspecified organism: Secondary | ICD-10-CM | POA: Diagnosis present

## 2022-02-07 DIAGNOSIS — J9811 Atelectasis: Secondary | ICD-10-CM | POA: Diagnosis not present

## 2022-02-07 DIAGNOSIS — N179 Acute kidney failure, unspecified: Secondary | ICD-10-CM | POA: Diagnosis present

## 2022-02-07 DIAGNOSIS — G4700 Insomnia, unspecified: Secondary | ICD-10-CM | POA: Diagnosis present

## 2022-02-07 DIAGNOSIS — Z903 Acquired absence of stomach [part of]: Secondary | ICD-10-CM | POA: Diagnosis not present

## 2022-02-07 DIAGNOSIS — N1831 Chronic kidney disease, stage 3a: Secondary | ICD-10-CM | POA: Diagnosis present

## 2022-02-07 DIAGNOSIS — I350 Nonrheumatic aortic (valve) stenosis: Secondary | ICD-10-CM | POA: Diagnosis present

## 2022-02-07 DIAGNOSIS — K8051 Calculus of bile duct without cholangitis or cholecystitis with obstruction: Secondary | ICD-10-CM

## 2022-02-07 DIAGNOSIS — J45909 Unspecified asthma, uncomplicated: Secondary | ICD-10-CM | POA: Diagnosis not present

## 2022-02-07 DIAGNOSIS — F32A Depression, unspecified: Secondary | ICD-10-CM | POA: Diagnosis present

## 2022-02-07 DIAGNOSIS — I129 Hypertensive chronic kidney disease with stage 1 through stage 4 chronic kidney disease, or unspecified chronic kidney disease: Secondary | ICD-10-CM | POA: Diagnosis present

## 2022-02-07 DIAGNOSIS — K810 Acute cholecystitis: Secondary | ICD-10-CM

## 2022-02-07 DIAGNOSIS — F418 Other specified anxiety disorders: Secondary | ICD-10-CM | POA: Diagnosis not present

## 2022-02-07 DIAGNOSIS — G2581 Restless legs syndrome: Secondary | ICD-10-CM | POA: Diagnosis present

## 2022-02-07 DIAGNOSIS — R079 Chest pain, unspecified: Secondary | ICD-10-CM | POA: Diagnosis not present

## 2022-02-07 DIAGNOSIS — N178 Other acute kidney failure: Secondary | ICD-10-CM | POA: Diagnosis not present

## 2022-02-07 DIAGNOSIS — K8063 Calculus of gallbladder and bile duct with acute cholecystitis with obstruction: Secondary | ICD-10-CM | POA: Diagnosis present

## 2022-02-07 DIAGNOSIS — K8020 Calculus of gallbladder without cholecystitis without obstruction: Secondary | ICD-10-CM | POA: Diagnosis not present

## 2022-02-07 DIAGNOSIS — Z8711 Personal history of peptic ulcer disease: Secondary | ICD-10-CM | POA: Diagnosis not present

## 2022-02-07 DIAGNOSIS — Z853 Personal history of malignant neoplasm of breast: Secondary | ICD-10-CM

## 2022-02-07 DIAGNOSIS — Z79899 Other long term (current) drug therapy: Secondary | ICD-10-CM | POA: Diagnosis not present

## 2022-02-07 DIAGNOSIS — K819 Cholecystitis, unspecified: Secondary | ICD-10-CM | POA: Diagnosis not present

## 2022-02-07 DIAGNOSIS — I728 Aneurysm of other specified arteries: Secondary | ICD-10-CM | POA: Diagnosis present

## 2022-02-07 DIAGNOSIS — K831 Obstruction of bile duct: Secondary | ICD-10-CM

## 2022-02-07 DIAGNOSIS — Z01810 Encounter for preprocedural cardiovascular examination: Secondary | ICD-10-CM | POA: Diagnosis not present

## 2022-02-07 DIAGNOSIS — K8050 Calculus of bile duct without cholangitis or cholecystitis without obstruction: Secondary | ICD-10-CM | POA: Diagnosis not present

## 2022-02-07 DIAGNOSIS — R0902 Hypoxemia: Secondary | ICD-10-CM | POA: Diagnosis present

## 2022-02-07 DIAGNOSIS — Z923 Personal history of irradiation: Secondary | ICD-10-CM | POA: Diagnosis not present

## 2022-02-07 DIAGNOSIS — Z8042 Family history of malignant neoplasm of prostate: Secondary | ICD-10-CM | POA: Diagnosis not present

## 2022-02-07 DIAGNOSIS — R1013 Epigastric pain: Secondary | ICD-10-CM | POA: Diagnosis not present

## 2022-02-07 DIAGNOSIS — E785 Hyperlipidemia, unspecified: Secondary | ICD-10-CM | POA: Diagnosis present

## 2022-02-07 DIAGNOSIS — Z96641 Presence of right artificial hip joint: Secondary | ICD-10-CM | POA: Diagnosis present

## 2022-02-07 DIAGNOSIS — K219 Gastro-esophageal reflux disease without esophagitis: Secondary | ICD-10-CM | POA: Diagnosis present

## 2022-02-07 DIAGNOSIS — D631 Anemia in chronic kidney disease: Secondary | ICD-10-CM | POA: Diagnosis present

## 2022-02-07 HISTORY — PX: BILIARY DILATION: SHX6850

## 2022-02-07 HISTORY — PX: ERCP: SHX5425

## 2022-02-07 HISTORY — PX: SPHINCTEROTOMY: SHX5544

## 2022-02-07 HISTORY — PX: BILIARY STENT PLACEMENT: SHX5538

## 2022-02-07 HISTORY — PX: REMOVAL OF STONES: SHX5545

## 2022-02-07 LAB — CBC WITH DIFFERENTIAL/PLATELET
Abs Immature Granulocytes: 0.1 10*3/uL — ABNORMAL HIGH (ref 0.00–0.07)
Basophils Absolute: 0 10*3/uL (ref 0.0–0.1)
Basophils Relative: 0 %
Eosinophils Absolute: 0 10*3/uL (ref 0.0–0.5)
Eosinophils Relative: 0 %
HCT: 25.2 % — ABNORMAL LOW (ref 36.0–46.0)
Hemoglobin: 8.2 g/dL — ABNORMAL LOW (ref 12.0–15.0)
Immature Granulocytes: 1 %
Lymphocytes Relative: 6 %
Lymphs Abs: 1.1 10*3/uL (ref 0.7–4.0)
MCH: 29.9 pg (ref 26.0–34.0)
MCHC: 32.5 g/dL (ref 30.0–36.0)
MCV: 92 fL (ref 80.0–100.0)
Monocytes Absolute: 1 10*3/uL (ref 0.1–1.0)
Monocytes Relative: 6 %
Neutro Abs: 15.1 10*3/uL — ABNORMAL HIGH (ref 1.7–7.7)
Neutrophils Relative %: 87 %
Platelets: 172 10*3/uL (ref 150–400)
RBC: 2.74 MIL/uL — ABNORMAL LOW (ref 3.87–5.11)
RDW: 15.2 % (ref 11.5–15.5)
WBC: 17.3 10*3/uL — ABNORMAL HIGH (ref 4.0–10.5)
nRBC: 0 % (ref 0.0–0.2)

## 2022-02-07 LAB — RESPIRATORY PANEL BY PCR

## 2022-02-07 LAB — BLOOD GAS, VENOUS
Acid-Base Excess: 2 mmol/L (ref 0.0–2.0)
Bicarbonate: 26.5 mmol/L (ref 20.0–28.0)
Drawn by: 47141
O2 Saturation: 97 %
Patient temperature: 37.7
pCO2, Ven: 41 mmHg — ABNORMAL LOW (ref 44–60)
pH, Ven: 7.42 (ref 7.25–7.43)
pO2, Ven: 98 mmHg — ABNORMAL HIGH (ref 32–45)

## 2022-02-07 LAB — COMPREHENSIVE METABOLIC PANEL
ALT: 482 U/L — ABNORMAL HIGH (ref 0–44)
AST: 693 U/L — ABNORMAL HIGH (ref 15–41)
Albumin: 2.5 g/dL — ABNORMAL LOW (ref 3.5–5.0)
Alkaline Phosphatase: 149 U/L — ABNORMAL HIGH (ref 38–126)
Anion gap: 7 (ref 5–15)
BUN: 24 mg/dL — ABNORMAL HIGH (ref 8–23)
CO2: 25 mmol/L (ref 22–32)
Calcium: 8.4 mg/dL — ABNORMAL LOW (ref 8.9–10.3)
Chloride: 107 mmol/L (ref 98–111)
Creatinine, Ser: 1.54 mg/dL — ABNORMAL HIGH (ref 0.44–1.00)
GFR, Estimated: 33 mL/min — ABNORMAL LOW (ref >60)
Glucose, Bld: 162 mg/dL — ABNORMAL HIGH (ref 70–99)
Potassium: 4 mmol/L (ref 3.5–5.1)
Sodium: 139 mmol/L (ref 135–145)
Total Bilirubin: 2.1 mg/dL — ABNORMAL HIGH (ref 0.3–1.2)
Total Protein: 5.4 g/dL — ABNORMAL LOW (ref 6.5–8.1)

## 2022-02-07 LAB — GLUCOSE, CAPILLARY
Glucose-Capillary: 111 mg/dL — ABNORMAL HIGH (ref 70–99)
Glucose-Capillary: 102 mg/dL — ABNORMAL HIGH (ref 70–99)

## 2022-02-07 LAB — URINALYSIS, ROUTINE W REFLEX MICROSCOPIC
Bacteria, UA: NONE SEEN
Bilirubin Urine: NEGATIVE
Glucose, UA: NEGATIVE mg/dL
Ketones, ur: NEGATIVE mg/dL
Leukocytes,Ua: NEGATIVE
Nitrite: NEGATIVE
Protein, ur: NEGATIVE mg/dL
Specific Gravity, Urine: 1.024 (ref 1.005–1.030)
pH: 6 (ref 5.0–8.0)

## 2022-02-07 LAB — BILIRUBIN, DIRECT: Bilirubin, Direct: 1 mg/dL — ABNORMAL HIGH (ref 0.0–0.2)

## 2022-02-07 LAB — SURGICAL PCR SCREEN
MRSA, PCR: NEGATIVE
Staphylococcus aureus: NEGATIVE

## 2022-02-07 LAB — BRAIN NATRIURETIC PEPTIDE: B Natriuretic Peptide: 650.5 pg/mL — ABNORMAL HIGH (ref 0.0–100.0)

## 2022-02-07 LAB — MAGNESIUM: Magnesium: 1.7 mg/dL (ref 1.7–2.4)

## 2022-02-07 LAB — PHOSPHORUS: Phosphorus: 4.7 mg/dL — ABNORMAL HIGH (ref 2.5–4.6)

## 2022-02-07 LAB — PROTIME-INR
INR: 1.2 (ref 0.8–1.2)
Prothrombin Time: 14.7 seconds (ref 11.4–15.2)

## 2022-02-07 LAB — TROPONIN I (HIGH SENSITIVITY)
Troponin I (High Sensitivity): 101 ng/L (ref <18)
Troponin I (High Sensitivity): 98 ng/L — ABNORMAL HIGH (ref <18)

## 2022-02-07 LAB — LACTIC ACID, PLASMA: Lactic Acid, Venous: 1.4 mmol/L (ref 0.5–1.9)

## 2022-02-07 LAB — PROCALCITONIN: Procalcitonin: 21.87 ng/mL

## 2022-02-07 SURGERY — ERCP, WITH INTERVENTION IF INDICATED
Anesthesia: General

## 2022-02-07 MED ORDER — HEPARIN SODIUM (PORCINE) 5000 UNIT/ML IJ SOLN
5000.0000 [IU] | Freq: Three times a day (TID) | INTRAMUSCULAR | Status: DC
  Administered 2022-02-07: 5000 [IU] via SUBCUTANEOUS
  Filled 2022-02-07: qty 1

## 2022-02-07 MED ORDER — METOPROLOL TARTRATE 5 MG/5ML IV SOLN
2.5000 mg | Freq: Four times a day (QID) | INTRAVENOUS | Status: DC | PRN
Start: 2022-02-07 — End: 2022-02-09

## 2022-02-07 MED ORDER — LACTATED RINGERS IV SOLN: INTRAVENOUS | Status: AC

## 2022-02-07 MED ORDER — ROCURONIUM BROMIDE 10 MG/ML (PF) SYRINGE
PREFILLED_SYRINGE | INTRAVENOUS | Status: DC | PRN
  Administered 2022-02-07: 30 mg via INTRAVENOUS

## 2022-02-07 MED ORDER — SODIUM CHLORIDE 0.9 % IV SOLN
INTRAVENOUS | Status: DC | PRN
  Administered 2022-02-07: 25 mL

## 2022-02-07 MED ORDER — LACTATED RINGERS IV BOLUS
250.0000 mL | Freq: Once | INTRAVENOUS | Status: AC
Start: 2022-02-07 — End: 2022-02-07
  Administered 2022-02-07: 250 mL via INTRAVENOUS

## 2022-02-07 MED ORDER — SUGAMMADEX SODIUM 200 MG/2ML IV SOLN
INTRAVENOUS | Status: DC | PRN
  Administered 2022-02-07: 130 mg via INTRAVENOUS

## 2022-02-07 MED ORDER — ROPINIROLE HCL 0.25 MG PO TABS
0.2500 mg | ORAL_TABLET | Freq: Every day | ORAL | Status: DC
  Administered 2022-02-07 – 2022-02-08 (×2): 0.25 mg via ORAL
  Filled 2022-02-07 (×4): qty 1

## 2022-02-07 MED ORDER — ONDANSETRON HCL 4 MG/2ML IJ SOLN
INTRAMUSCULAR | Status: DC | PRN
  Administered 2022-02-07: 4 mg via INTRAVENOUS

## 2022-02-07 MED ORDER — DICLOFENAC SUPPOSITORY 100 MG
RECTAL | Status: AC
  Filled 2022-02-07: qty 1

## 2022-02-07 MED ORDER — PIPERACILLIN-TAZOBACTAM 3.375 G IVPB
3.3750 g | Freq: Three times a day (TID) | INTRAVENOUS | Status: DC
  Administered 2022-02-07 – 2022-02-09 (×6): 3.375 g via INTRAVENOUS
  Filled 2022-02-07 (×9): qty 50

## 2022-02-07 MED ORDER — ACETAMINOPHEN 325 MG PO TABS
650.0000 mg | ORAL_TABLET | Freq: Four times a day (QID) | ORAL | Status: DC | PRN
  Administered 2022-02-07 – 2022-02-08 (×2): 650 mg via ORAL
  Filled 2022-02-07 (×2): qty 2

## 2022-02-07 MED ORDER — LIDOCAINE 2% (20 MG/ML) 5 ML SYRINGE
INTRAMUSCULAR | Status: DC | PRN
  Administered 2022-02-07: 40 mg via INTRAVENOUS

## 2022-02-07 MED ORDER — CIPROFLOXACIN IN D5W 400 MG/200ML IV SOLN
INTRAVENOUS | Status: AC
  Filled 2022-02-07: qty 200

## 2022-02-07 MED ORDER — ALBUTEROL SULFATE (2.5 MG/3ML) 0.083% IN NEBU: 2.5000 mg | INHALATION_SOLUTION | RESPIRATORY_TRACT | Status: DC | PRN

## 2022-02-07 MED ORDER — SODIUM CHLORIDE 0.9 % IV SOLN: INTRAVENOUS | Status: DC

## 2022-02-07 MED ORDER — GABAPENTIN 300 MG PO CAPS
600.0000 mg | ORAL_CAPSULE | Freq: Every day | ORAL | Status: DC
  Administered 2022-02-07 – 2022-02-08 (×2): 600 mg via ORAL
  Filled 2022-02-07 (×2): qty 2

## 2022-02-07 MED ORDER — ACETAMINOPHEN 650 MG RE SUPP: 650.0000 mg | Freq: Four times a day (QID) | RECTAL | Status: DC | PRN

## 2022-02-07 MED ORDER — INDOMETHACIN 50 MG RE SUPP
RECTAL | Status: AC
  Filled 2022-02-07: qty 2

## 2022-02-07 MED ORDER — DULOXETINE HCL 20 MG PO CPEP
20.0000 mg | ORAL_CAPSULE | Freq: Two times a day (BID) | ORAL | Status: DC
  Administered 2022-02-07 – 2022-02-09 (×4): 20 mg via ORAL
  Filled 2022-02-07 (×5): qty 1

## 2022-02-07 MED ORDER — PIPERACILLIN-TAZOBACTAM 3.375 G IVPB 30 MIN: 3.3750 g | Freq: Four times a day (QID) | INTRAVENOUS | Status: DC

## 2022-02-07 MED ORDER — PANTOPRAZOLE SODIUM 40 MG PO TBEC: 40.0000 mg | DELAYED_RELEASE_TABLET | Freq: Every day | ORAL | Status: DC

## 2022-02-07 MED ORDER — FENTANYL CITRATE (PF) 100 MCG/2ML IJ SOLN
INTRAMUSCULAR | Status: AC
  Filled 2022-02-07: qty 2

## 2022-02-07 MED ORDER — SUCCINYLCHOLINE CHLORIDE 200 MG/10ML IV SOSY
PREFILLED_SYRINGE | INTRAVENOUS | Status: DC | PRN
  Administered 2022-02-07: 60 mg via INTRAVENOUS

## 2022-02-07 MED ORDER — INDOMETHACIN 50 MG RE SUPP
RECTAL | Status: DC | PRN
  Administered 2022-02-07: 100 mg via RECTAL

## 2022-02-07 MED ORDER — ONDANSETRON HCL 4 MG PO TABS: 4.0000 mg | ORAL_TABLET | Freq: Four times a day (QID) | ORAL | Status: DC | PRN

## 2022-02-07 MED ORDER — PANTOPRAZOLE SODIUM 40 MG PO TBEC
40.0000 mg | DELAYED_RELEASE_TABLET | Freq: Every day | ORAL | Status: DC
  Administered 2022-02-08 – 2022-02-09 (×2): 40 mg via ORAL
  Filled 2022-02-07 (×2): qty 1

## 2022-02-07 MED ORDER — PHENYLEPHRINE HCL-NACL 20-0.9 MG/250ML-% IV SOLN
INTRAVENOUS | Status: DC | PRN
  Administered 2022-02-07: 25 ug/min via INTRAVENOUS

## 2022-02-07 MED ORDER — OXYCODONE HCL 5 MG PO TABS
5.0000 mg | ORAL_TABLET | ORAL | Status: DC | PRN
  Administered 2022-02-07 – 2022-02-09 (×3): 5 mg via ORAL
  Filled 2022-02-07 (×3): qty 1

## 2022-02-07 MED ORDER — PHENYLEPHRINE 80 MCG/ML (10ML) SYRINGE FOR IV PUSH (FOR BLOOD PRESSURE SUPPORT)
PREFILLED_SYRINGE | INTRAVENOUS | Status: DC | PRN
  Administered 2022-02-07 (×2): 80 ug via INTRAVENOUS

## 2022-02-07 MED ORDER — PANTOPRAZOLE SODIUM 40 MG IV SOLR
40.0000 mg | INTRAVENOUS | Status: DC
  Administered 2022-02-07: 40 mg via INTRAVENOUS
  Filled 2022-02-07: qty 10

## 2022-02-07 MED ORDER — FENTANYL CITRATE (PF) 100 MCG/2ML IJ SOLN
INTRAMUSCULAR | Status: DC | PRN
Start: 2022-02-07 — End: 2022-02-07
  Administered 2022-02-07: 25 ug via INTRAVENOUS

## 2022-02-07 MED ORDER — MUPIROCIN 2 % EX OINT
1.0000 | TOPICAL_OINTMENT | Freq: Two times a day (BID) | CUTANEOUS | Status: DC
  Administered 2022-02-07: 1 via NASAL

## 2022-02-07 MED ORDER — ONDANSETRON HCL 4 MG/2ML IJ SOLN: 4.0000 mg | Freq: Four times a day (QID) | INTRAMUSCULAR | Status: DC | PRN

## 2022-02-07 MED ORDER — GLUCAGON HCL RDNA (DIAGNOSTIC) 1 MG IJ SOLR
INTRAMUSCULAR | Status: AC
  Filled 2022-02-07: qty 2

## 2022-02-07 MED ORDER — GLUCAGON HCL RDNA (DIAGNOSTIC) 1 MG IJ SOLR
INTRAMUSCULAR | Status: DC | PRN
  Administered 2022-02-07: .25 mg via INTRAVENOUS

## 2022-02-07 MED ORDER — PROPOFOL 10 MG/ML IV BOLUS
INTRAVENOUS | Status: DC | PRN
  Administered 2022-02-07: 70 mg via INTRAVENOUS

## 2022-02-07 NOTE — Op Note (Signed)
[facility] Patient Name: Brandy [name] Procedure Date : 02/07/2022 MRN: [id number] Attending MD: Jackquline Denmark , MD Date of Birth: [birth year] CSN: [id number] Age: 88 Admit Type: Inpatient Procedure:                ERCP Indications:              Suspected ascending cholangitis with abnormal LFTs,                            dilated CBD, new jaundice with elevated WBC count.                            Prior H/O CBD stricture, failed ERCP 2015 followed                            by rendezvous procedure Providers:                Jackquline Denmark, MD, Dulcy Fanny, Benetta Spar, Technician Referring MD:              Medicines:                General Anesthesia. Zosyn IV, glucagon 0.2 mg.                            Indocin suppositories Complications:            No immediate complications. Estimated Blood Loss:     Estimated blood loss: minimal. Procedure:                Pre-Anesthesia Assessment:                           - Prior to the procedure, a History and Physical                            was performed, and patient medications and                            allergies were reviewed. The patient's tolerance of                            previous anesthesia was also reviewed. The risks                            and benefits of the procedure and the sedation                            options and risks were discussed with the patient.                            All questions were answered, and informed consent                            was obtained. Prior  Anticoagulants: The patient has                            taken heparin, last dose was day of procedure. ASA                            Grade Assessment: IV - A patient with severe                            systemic disease that is a constant threat to life.                            After reviewing the risks and benefits, the patient                            was deemed in satisfactory  condition to undergo the                            procedure.                           After obtaining informed consent, the scope was                            passed under direct vision. Throughout the                            procedure, the patient's blood pressure, pulse, and                            oxygen saturations were monitored continuously. The                            [id number]-[id number] ([id number]) Olympus duodenoscope was                            introduced through the mouth, and used to inject                            contrast into and used to inject contrast into the                            bile duct.                           The insertion of side-viewing Olympus scope (to the                            second portion of duodenum) was challenging due to                            J-shaped stomach and abnormal anatomy. Successful  completion of the procedure was aided by placing                            patient in left lateral position. The patient                            tolerated the procedure well. Scope In: Scope Out: Findings:      The scout film was normal. The esophagus was successfully intubated       under direct vision. The scope was advanced to the descending duodenum       without detailed examination of the pharynx, larynx and associated       structures, and upper GI tract. The upper GI tract was grossly normal.      Major papilla was located under the hood. There was post sphincterotomy       stenosis measuring 4 mm. CBD was cannulated on first attempt using       wire-guided approach. Contrast was injected. I personally interpreted       the bile duct images. Ductal flow of contrast was adequate. Image       quality was adequate. Contrast extended to the entire biliary tree.       Cystic duct was patent. Gallbladder did fill-some filling defects in the       gallbladder.      The CBD was significantly dilated measuring  15 mm with several filling       defects consistent with choledocholithiasis largest 87m. There was       smooth tapering of distal CBD. A 6 mm biliary sphincterotomy was made       with a sphincterotome using ERBE's endocut mode. There was no       post-sphincterotomy bleeding. Sphincteroplasty was accomplished using 10       mm balloon. The biliary tree was swept with a 15 mm balloon starting at       the bifurcation. 2 stones and significant sludge was swept from the       duct. There was drainage of bile mixed with pus. There was some oozing       with 15 mm extraction balloon trawl. Due to significantly dilated CBD       and since we were not sure if all the stones were removed, we decided to       go ahead with stenting.      A 10 Fr by 7 cm plastic stent was placed into the common bile duct. Bile       flowed through the stent. The stent was in good position.      Pancreas duct was intentionally not cannulated or injected. Impression:               - Biliary papillary stenosis s/p biliary                            sphincterotomy followed by sphincteroplasty.                           - The entire main bile duct was markedly dilated,                            with an obstruction.                           -  Choledocholithiasis s/p balloon extraction                            followed by Endo biliary plastic stenting.                           - Ascending cholangitis Recommendation:           - Return patient to hospital ward for ongoing care.                           - Clear liquid diet.                           - Continue Zosyn x 5 days                           - Hold VTE prophylaxis via heparin x 72 hrs (can                            use TEDS)                           - Rpt ERCP with +/- spy, stent extraction in 6-8                            weeks by Dr Rush Landmark (our advanced endoscopist)                           - ?lap chole if OK with Sx/family.                            - Watch for pancreatitis, bleeding, perforation,                            and cholangitis.                           - The findings and recommendations were discussed                            with the patient's daughter. Procedure Code(s):        --- Professional ---                           [phone], Endoscopic retrograde                            cholangiopancreatography (ERCP); with placement of                            endoscopic stent into biliary or pancreatic duct,                            including pre- and post-dilation and guide wire  passage, when performed, including sphincterotomy,                            when performed, each stent                           [id number], Endoscopic retrograde                            cholangiopancreatography (ERCP); with removal of                            calculi/debris from biliary/pancreatic duct(s)                           [phone], Endoscopic catheterization of the biliary                            ductal system, radiological supervision and                            interpretation Diagnosis Code(s):        --- Professional ---                           K83.1, Obstruction of bile duct                           K80.50, Calculus of bile duct without cholangitis                            or cholecystitis without obstruction CPT copyright 2019 [employer]. All rights reserved. The codes documented in this report are preliminary and upon coder review may  be revised to meet current compliance requirements. Jackquline Denmark, MD 02/07/2022 2:21:43 PM This report has been signed electronically. Number of Addenda: 0

## 2022-02-07 NOTE — Consult Note (Addendum)
Referring Provider: Dr. Myles Rosenthal  Primary Care Physician:  Kathyrn Lass, MD Primary Gastroenterologist:  Dr.Daniel Ardis Hughs   Reason for Consultation:  Chest pain, intra/extrahepatic ductal dilatation per CT, acute cholecystitis   HPI: Brandy [name] is a 88 y.o. female with a past medical history of depression, asthma, aortic stenosis, hypertension, hyperlipidemia, anemia, breast cancer s/p mastectomy (treated with chemo, radiation and Letrozole 2013), kidney stones, gallstones, GERD, duodenal ulcers, pancreatic mass 2012 - 2013, ampulla stricture and choledocholithiasis s/p ERCP 2015. Incarcerated incisional hernia s/p small bowel resection/repair 11/2016  She developed crushing left chest pain and was transported to [facility] 02/06/2022 for further evaluation. She received ASA and NTG SL x 3 with improvement. Chest pain radiates to the back. Labs in the ED showed a WBC count of 11.1. Hg 9.0 (Hg 9.5 on 01/16/2022). HCT 28.4. VCB449. Na+ 139. K+ 4.3. BUN 22. Cr 1.39 (1.16 on 08/19/2021).T. bili 0.8. Direct bili 0.3. Alk phos 78. AST 36. ALT 22. Lipase < 10.Troponin 36 -> 41. EKG showed LVH, no acute ischemia. Chest xray showed cardiomegaly.Chest /abd/pelvic CTA identified trace left pleural effusion or thickening, no evidence of PE or aortic dissection, a distended gallbladder with extensive pericholecystic inflammatory changes concerning for acute cholecystitis with intra and extrahepatic biliary ductal dilatation to the level of the ampulla, 50 - 75% stensosis of the celiac/inferior mesenteric artery, 74m splenic artery aneurysm and aortal valve and coronary artery calcifications noted.  Echo completed, results pending.  A GI consult was requested for further evaluation and possible ERCP.  Labs this morning showing significant leukocytosis with WBC count up to 17.3. LFTs now elevated with T. Bili 2.1. Alk phos 149. AST 693. ALT 482.  Temperature 100.6 F.  Blood cultures obtained.  Respiratory panel in  process.  In the setting of biliary obstruction with leukocytosis she will go directly to ERCP with Dr. GLyndel Safetoday.  She is NPO.  Receive Zosyn IV.  She received heparin 5000 units subcu at 0523 this morning.  I attempted to contact her daughter Brandy Boardat this time, she did not answer her phone.  She is somewhat somnolent at this time but arousable.  She denies having any chest pain, palpitations or shortness of breath.  She developed severe chest pain yesterday when sitting in a chair.  No nausea or vomiting.  She has occasional heartburn for which she takes Prilosec as needed.  No dysphagia.  No upper or lower abdominal pain.  Bowel movements are typically normal.  No rectal bleeding, black or acholic stools.  He takes aspirin 81 mg possibly every day but is not on any blood thinners.  She has a nonproductive cough for few days.  No hemoptysis.  No history of lung disease.  She has a history of choledocholithiasis and ampullary stricture.  She underwent ERCP by Dr. HRosalee Kaufmanwhich failed secondary to the presence of an ampullary stricture.  Subsequently underwent PTC drain placement per IR and CBD stones were pushed into the duodenum after biliary dilatation.  Status post EUS with repeat ERCP 02/01/2014 by Dr. JArdis Hughs the biliary drain was removed, doubt evidence of pancreatic or biliary masses, the prior CBD stricture resolved and CBD brushings were benign.  See endoscopic procedure summary below for further details  PAST GI PROCEDURES:  EGD 09/10/2016: - Gastritis is more signficant today than 3 months ago. The previous incisure ulcer has healed and the duodenal bulb ulcer is much smaller however there is a new clean based ulcer in pyloric channel. Multiple  biopsies were taken around this ulcer and sent to path. - REACTIVE / REGENERATIVE SMALL BOWEL MUCOSA SHOWING GASTRIC FOVEOLAR METAPLASIA AND ASSOCIATED MINIMAL ACUTE INFLAMMATION. - NO DYSPLASIA OR MALIGNANCY IDENTIFIED.  EGD 06/26/2016: -  Normal esophagus. - Non-bleeding gastric ulcer with no stigmata of bleeding. Distal stomach was biopsied to check for H. pylori - One non-bleeding duodenal ulcer with no stigmata of bleeding. CHRONIC INACTIVE GASTRITIS. - THERE IS NO EVIDENCE OF HELICOBACTER PYLORI, DYSPLASIA, OR MALIGNANCY.  EUS 02/01/2014 by Dr. Ardis Hughs. No pancreatic or biliary masses. The previously noted CBD stricture has resolved. The distal CBD was sampled with brush for cytology, a small sphinctertomy and balloon sweeping was performed.  There are benign glandular cells and glandular cells with cytologic atypia and inflammation. The features favor reactive atypia.  ERCP 02/01/2014: 1. Dr. Pascal Lux from IR introduced a wire through the biliary drain and then the biliary drain was removed externally. 2. Alongside the wire, I cannulated the CBD using a 44 Autotome over a .035 hydrawire and injected contrast. The cholangiogram showed diffusely dilated bile duct (up to 17m), partial filling of GB and cystic duct, relatively normal intrahepatic ducts. The distal CBD tapered smoothly into the head of pancreas without obvious stricture. The internal external biliary wire was then removed from the patient externally, the ERCP wire was left in place to ensure access. Given the previously known stricture and bile duct stones I elected to brush the distal CBD for cytology, performed short biliary sphincterotomy and the swept the bile duct several times with a biliary balloon. No stones, pus or debris was delivered into the duodenum. The scope was removed from the patient. The main pancreatic duct was never cannulated or injected with dye. Impression: No pancreatic or biliary masses. The previously noted CBD stricture has resolved. The distal CBD was sampled with brush for cytology, a small sphinctertomy and balloon sweeping was performed. My suspicion for neoplasm is low. She will have repeat LFTS at my office in one week. Await  final cytology results.  ERCP 12/19/2013 by Dr. HBenson Norway Advancing the scope to the through the pylorus was difficult as there was a sharp angulation of the antrum. Ultimately the duodenoscope was maneuvered through the area. The ampulla was very difficult to find, but once identified cannulation failed. I used both the .035 and the .025 sphinctertomes without any success. The ampulla was very tightly strictured. No spontaneous drainage of bile was noted. After these failed attempts the procedure was terminated. IR PTC drain placement and CBD stones were pushed into the duodenum after biliary dilation. Still stricture noted on IR cholangiogram and so for past 6 weeks 12 Fr int/ext capped drain has been in place. No weight loss..Marland Kitchen  CARDIAC CATHETERIZATION 05/08/2021:   No angiographically apparent coronary artery disease.   Ao sat 94%, PA sat 63%, PA pressure 26/6, mean PA pressure 13 mmHg, mean pulmonary capillary wedge pressure 5 mmHg, cardiac output 5.4 L/min, cardiac index 3.39.   Severe right subclavian tortuosity.  If cardiac cath was needed in the future, would not use right radial approach.   Did not cross aortic valve  ECHO 05/06/2021: IMPRESSIONS Left ventricular ejection fraction, by estimation, is 70 to 75%. The left ventricle has hyperdynamic function. The left ventricle has no regional wall motion abnormalities. There is mild left ventricular hypertrophy. Left ventricular diastolic parameters are consistent with Grade I diastolic dysfunction (impaired relaxation). Elevated left atrial pressure. 1. 2. Right ventricular systolic function is normal. The right ventricular size is  normal. 3. Mild mitral valve regurgitation. Moderate to severe mitral annular calcification. AV is thickened, calcified with restricted motion. Peak and mean gradients through the valve are 64 and 38 mm Hg respectively. Dimensionless index is 0.27 Consistent with moderate to severe AS> Compared to echo  report from 2021, mean gradient is increased (32m to 38 mm ) . The aortic valve is abnormal. Aortic valve regurgitation is not visualized. 4. The inferior vena cava is normal in size with greater than 50% respiratory variability, suggesting right atrial pressure of 3 mmHg.   Past Medical History:  Diagnosis Date   Anemia    Arthritis    shoulders   Asthma    mild, no inhalers used   [place]   left breast masectomy=metastatic ca in (1/1) lymph node ,invasive ductal ca,2 foci,,dcis,lymph ovascular invasion identified,surgical resection margins neg for ca,additional tissue=benign skin and subcutaneous tissue   Bronchitis    hx of;last time >89yrgo   Depression    takes Celexa daily   Difficult intubation    Dysphagia    Full dentures    Gall stones 2015   GERD (gastroesophageal reflux disease)    takes Prilosec prn   H/O hiatal hernia    History of kidney stones    many yrs ago   Hx of radiation therapy 04/26/12 -06/10/12   left breast   Hyperlipidemia    takes Simvastatin daily   Hypertension    takes Amlodipine and Diovan daily   Insomnia    takes Ambien nightly   Urinary urgency     Past Surgical History:  Procedure Laterality Date   APPENDECTOMY     bladder tack     BREAST BIOPSY  1998   left    DILATION AND CURETTAGE OF UTERUS     ENDOSCOPIC RETROGRADE CHOLANGIOPANCREATOGRAPHY (ERCP) WITH PROPOFOL N/A 02/01/2014   Procedure: ENDOSCOPIC RETROGRADE CHOLANGIOPANCREATOGRAPHY (ERCP) WITH PROPOFOL;  Surgeon: DaMilus BanisterMD;  Location: [facility];  Service: Endoscopy;  Laterality: N/A;   ESOPHAGOGASTRODUODENOSCOPY     ESOPHAGOGASTRODUODENOSCOPY N/A 06/26/2016   Procedure: ESOPHAGOGASTRODUODENOSCOPY (EGD);  Surgeon: DaMilus BanisterMD;  [place] [facility] Service: [place];  Laterality: N/A;   ESOPHAGOGASTRODUODENOSCOPY (EGD) WITH PROPOFOL  12/19/2013   Procedure: ESOPHAGOGASTRODUODENOSCOPY (EGD) WITH PROPOFOL;  Surgeon: PaBeryle BeamsMD;  [place] [facility] Service: [place];;   ESOPHAGOGASTRODUODENOSCOPY (EGD) WITH PROPOFOL N/A 09/10/2016   Procedure: ESOPHAGOGASTRODUODENOSCOPY (EGD) WITH PROPOFOL;  Surgeon: JaMilus BanisterMD;  [facility];  Service: Endoscopy;  Laterality: N/A;   EUS  06/04/2011   Procedure: UPPER ENDOSCOPIC ULTRASOUND (EUS) LINEAR;  Surgeon: DaOwens LofflerMD;  Location: [facility];  Service: Endoscopy;  Laterality: N/A;   EUS N/A 02/01/2014   Procedure: UPPER ENDOSCOPIC ULTRASOUND (EUS) LINEAR;  Surgeon: DaMilus BanisterMD;  Location: [facility];  Service: Endoscopy;  Laterality: N/A;   EXPLORATORY LAPAROTOMY      biopsy of intra-abdominal mass   INTRAMEDULLARY (IM) NAIL INTERTROCHANTERIC Right 10/24/2018   Procedure: INTRAMEDULLARY (IM) NAIL INTERTROCHANTRIC;  Surgeon: AlGaynelle ArabianMD;  [facility]: [facility];  Service: Orthopedics;  Laterality: Right;   LAPAROTOMY N/A 11/17/2016   Procedure: EXPLORATORY LAPAROTOMY WITH LYSIS OF ADHESIONS, SMALL BOWEL RESECTION, REPAIR OF INCARCERATED VENTRAL INCISIONAL HERNIA, INSERTION OF BIOLOGIC MESH PATCH,APPLICATION OF WOUND VAC DRESSING;  Surgeon: GeArmandina GemmaMD;  Location: [facility];  Service: General;  Laterality: N/A;   MASTECTOMY W/ SENTINEL NODE BIOPSY  12/18/2011   Procedure: MASTECTOMY WITH SENTINEL LYMPH NODE  BIOPSY;  Surgeon: Rolm Bookbinder, MD;  Location: [place];  Service: General;  Laterality: Left;   PORT-A-CATH REMOVAL Right 06/15/2013   Procedure: REMOVAL PORT-A-CATH;  Surgeon: Rolm Bookbinder, MD;  Location: [place];  Service: General;  Laterality: Right;   PORTACATH PLACEMENT  01/27/2012   Procedure: INSERTION PORT-A-CATH;  Surgeon: Rolm Bookbinder, MD;  Location: [place];  Service: General;  Laterality: Right;  PORT PLACEMENT   RIGHT/LEFT HEART CATH AND CORONARY ANGIOGRAPHY N/A 05/08/2021   Procedure: RIGHT/LEFT HEART CATH AND CORONARY ANGIOGRAPHY;  Surgeon: Jettie Booze, MD;   Location: [place] [facility];  Service: Cardiovascular;  Laterality: N/A;   TOTAL HIP ARTHROPLASTY Right 11/14/2018   Procedure: REMOVAL OF INTRAMEDULLARY NAIL AND POSTERIOR TOTAL HIP ARTHROPLASTY;  Surgeon: Gaynelle Arabian, MD;  [facility];  Service: Orthopedics;  Laterality: Right;   TOTAL SHOULDER REPLACEMENT  2011   left   TUBAL LIGATION      Prior to Admission medications   Medication Sig Start Date End Date Taking? Authorizing Provider  acetaminophen (TYLENOL) 500 MG tablet Take 1,000 mg by mouth every 6 (six) hours as needed for headache (pain).    [provider]  alendronate (FOSAMAX) 70 MG tablet Take 70 mg by mouth every Sunday. 04/01/21   [provider]  Cyanocobalamin (VITAMIN B-12 PO) Take 1 tablet by mouth daily.    [provider]  DULoxetine (CYMBALTA) 20 MG capsule Take 20 mg by mouth 2 (two) times daily. 04/17/21   [provider]  ferrous sulfate 325 (65 FE) MG tablet Take 1 tablet (325 mg total) by mouth daily. 05/09/21   Jennye Boroughs, MD  gabapentin (NEURONTIN) 300 MG capsule Take 2 capsules (600 mg total) by mouth at bedtime. 01/16/22   Nicholas Lose, MD  Multiple Vitamin (MULTIVITAMIN WITH MINERALS) TABS tablet Take 1 tablet by mouth daily.    [provider]  omeprazole (PRILOSEC) 20 MG capsule Take 20 mg by mouth daily as needed (heartburn/acid reflux).    [provider]  rOPINIRole (REQUIP) 0.25 MG tablet Take 0.25 mg by mouth at bedtime. 07/28/21   [provider]  traMADol (ULTRAM) 50 MG tablet Take 2 tablets (100 mg total) by mouth 4 (four) times daily. 11/16/18   Edmisten, Kristie L, PA  valsartan-hydrochlorothiazide (DIOVAN-HCT) 80-12.5 MG tablet Take 1 tablet by mouth daily.    [provider]  Vitamin D, Ergocalciferol, (DRISDOL) 50000 units CAPS capsule Take 50,000 Units by mouth every Sunday.    [provider]    Current Facility-Administered Medications  Medication Dose  Route Frequency Provider Last Rate Last Admin   acetaminophen (TYLENOL) tablet 650 mg  650 mg Oral Q6H PRN Clance Boll, MD       Or   acetaminophen (TYLENOL) suppository 650 mg  650 mg Rectal Q6H PRN Clance Boll, MD       albuterol (PROVENTIL) (2.5 MG/3ML) 0.083% nebulizer solution 2.5 mg  2.5 mg Nebulization Q2H PRN Clance Boll, MD       heparin injection 5,000 Units  5,000 Units Subcutaneous Q8H Myles Rosenthal A, MD       lactated ringers bolus 250 mL  250 mL Intravenous Once Clance Boll, MD       lactated ringers infusion   Intravenous Continuous Myles Rosenthal A, MD       metoprolol tartrate (LOPRESSOR) injection 2.5 mg  2.5 mg Intravenous Q6H PRN Clance Boll, MD  ondansetron (ZOFRAN) tablet 4 mg  4 mg Oral Q6H PRN Clance Boll, MD       Or   ondansetron [facility]) injection 4 mg  4 mg Intravenous Q6H PRN Clance Boll, MD       oxyCODONE (Oxy IR/ROXICODONE) immediate release tablet 5 mg  5 mg Oral Q4H PRN Clance Boll, MD       pantoprazole (PROTONIX) injection 40 mg  40 mg Intravenous Q24H Myles Rosenthal A, MD       piperacillin-tazobactam (ZOSYN) IVPB 3.375 g  3.375 g Intravenous Q8H Clance Boll, MD       Facility-Administered Medications Ordered in Other Encounters  Medication Dose Route Frequency Provider Last Rate Last Admin   sodium chloride 0.9 % injection 10 mL  10 mL Intracatheter PRN Marcy Panning, MD   10 mL at 03/14/12 1628    Allergies as of 02/06/2022   (No Known Allergies)    Family History  Problem Relation Age of Onset   Prostate cancer Father    Breast cancer Other     Social History   Socioeconomic History   Marital status: Married    Spouse name: Not on file   Number of children: Not on file   Years of education: Not on file   Highest education level: Not on file  Occupational History   Occupation: retired  Tobacco Use   Smoking status: Never   Smokeless tobacco: Never   Vaping Use   Vaping Use: Never used  Substance and Sexual Activity   Alcohol use: No   Drug use: No   Sexual activity: Yes    Birth control/protection: Surgical    Comment: mensus age 64, 1st pregnancy 78, no hrt gg4,p3, 1 babay lived a few hours complications  Other Topics Concern   Not on file  Social History Narrative   Not on file   Social Determinants of Health   Financial Resource Strain: Not on file  Food Insecurity: Unknown (10/29/2018)   Hunger Vital Sign    Worried About Running Out of Food in the Last Year: Patient refused    Ran Out of Food in the Last Year: Patient refused  Transportation Needs: Not on file  Physical Activity: Unknown (10/29/2018)   Exercise Vital Sign    Days of Exercise per Week: Patient refused    Minutes of Exercise per Session: Patient refused  Stress: Not on file  Social Connections: Unknown (10/29/2018)   Social Connection and Isolation Panel [NHANES]    Frequency of Communication with Friends and Family: Patient refused    Frequency of Social Gatherings with Friends and Family: Patient refused    Attends Religious Services: Patient refused    Active Member of Clubs or Organizations: Patient refused    Attends Archivist Meetings: Patient refused    Marital Status: Patient refused  Intimate Partner Violence: Unknown (10/29/2018)   Humiliation, Afraid, Rape, and Kick questionnaire    Fear of Current or Ex-Partner: Patient refused    Emotionally Abused: Patient refused    Physically Abused: Patient refused    Sexually Abused: Patient refused    Review of Systems: Gen: Denies fever, sweats or chills. No weight loss.  CV: See HPI  Resp: See HPI GI: See HPI. GU : Denies urinary burning, blood in urine, increased urinary frequency or incontinence. MS: Denies joint pain, muscles aches or weakness. Derm: Denies rash, itchiness, skin lesions or unhealing ulcers. Psych: Denies depression, anxiety or significant memory issues.  Heme:  Denies easy bruising, bleeding. Neuro:  Denies headaches, dizziness or paresthesias. Endo:  Denies any problems with DM, thyroid or adrenal function.  Physical Exam: Vital signs in last 24 hours: Temp:  [97.9 F (36.6 C)-100.6 F (38.1 C)] 100.6 F (38.1 C) (08/05 0456) Pulse Rate:  [89-132] 107 (08/05 0456) Resp:  [19-33] 19 (08/05 0456) BP: (116-189)/(51-114) 116/53 (08/05 0456) SpO2:  [83 %-99 %] 95 % (08/05 0456) Weight:  [60.8 kg-64.4 kg] 64.4 kg (08/05 0200) Last BM Date : 02/06/22 General: Somnolent but arousable, ill-appearing 88 year old female. Head:  Normocephalic and atraumatic. Eyes:  No scleral icterus. Conjunctiva pink. Ears:  Normal auditory acuity. Nose:  No deformity, discharge or lesions. Mouth: Absent dentition no ulcers or lesions.  Neck:  Supple. No lymphadenopathy or thyromegaly.  Lungs: Breath sounds with scattered expiratory wheezes.  On oxygen 3 L nasal cannula. Heart: Regular rate and rhythm, 2/6 systolic murmur. Abdomen: Soft, nondistended.  Nontender.  Positive bowel sounds all 4 quadrants.  Significant RUQ scar from prior surgery. Rectal: Deferred. Musculoskeletal:  Symmetrical without gross deformities.  Pulses:  Normal pulses noted. Extremities:  Without clubbing or edema. Neurologic:  Alert and  oriented x 3. Speech is clear.  She moves all extremities. Skin:  Intact without significant lesions or rashes. Psych:  Alert and cooperative. Normal mood and affect.  Intake/Output from previous day: 08/04 0701 - 08/05 0700 In: 50 [IV Piggyback:50] Out: 700 [Urine:700] Intake/Output this shift: Total I/O In: 50 [IV Piggyback:50] Out: 700 [Urine:700]  Lab Results: Recent Labs    02/06/22 1926  WBC 11.1*  HGB 9.0*  HCT 28.4*  PLT 188   BMET Recent Labs    02/06/22 1926  NA 139  K 4.3  CL 106  CO2 25  GLUCOSE 115*  BUN 22  CREATININE 1.39*  CALCIUM 9.1   LFT Recent Labs    02/06/22 1926  PROT 6.3*  ALBUMIN 3.2*  AST 36  ALT  22  ALKPHOS 78  BILITOT 0.8  BILIDIR 0.3*  IBILI 0.5   PT/INR No results for input(s): "LABPROT", "INR" in the last 72 hours. Hepatitis Panel No results for input(s): "HEPBSAG", "HCVAB", "HEPAIGM", "HEPBIGM" in the last 72 hours.    Studies/Results: CT Angio Chest/Abd/Pel for Dissection W and/or Wo Contrast  Result Date: 02/06/2022 CLINICAL DATA:  Hypertension, chest pain EXAM: CT ANGIOGRAPHY CHEST, ABDOMEN AND PELVIS TECHNIQUE: Non-contrast CT of the chest was initially obtained. Multidetector CT imaging through the chest, abdomen and pelvis was performed using the standard protocol during bolus administration of intravenous contrast. Multiplanar reconstructed images and MIPs were obtained and reviewed to evaluate the vascular anatomy. RADIATION DOSE REDUCTION: This exam was performed according to the departmental dose-optimization program which includes automated exposure control, adjustment of the mA and/or kV according to patient size and/or use of iterative reconstruction technique. CONTRAST:  13m OMNIPAQUE IOHEXOL 350 MG/ML SOLN COMPARISON:  CT chest 01/30/2019, CT abdomen pelvis 11/15/2016 FINDINGS: CTA CHEST FINDINGS Cardiovascular: The thoracic aorta is normal in course and caliber. No intramural hematoma, dissection, or aneurysm. Moderate atherosclerotic calcification. Bovine arch anatomy with wide patency of the arch vasculature proximally. Global cardiac size within normal limits though left ventricular hypertrophy is noted. Extensive calcification of the aortic valve leaflets. Extensive calcification of the mitral valve annulus. Mild coronary artery calcification. Trace pericardial effusion. Central pulmonary arteries are of normal caliber. Mediastinum/Nodes: The visualized thyroid is unremarkable. No pathologic thoracic adenopathy. The esophagus is unremarkable. Small hiatal hernia. Lungs/Pleura: Trace residual left pleural  effusion or pleural thickening. Mild parenchymal scarring  within the left upper lobe is unchanged. No new focal pulmonary nodules or infiltrates. No pneumothorax. Central airways are widely patent. Musculoskeletal: Status post left mastectomy. Osseous structures are diffusely osteopenic. Advanced degenerative changes are seen within the right shoulder. Left shoulder arthroplasty has been performed with anterior subluxation of the a humeral head in relation of the glenoid. Multiple healed left rib fractures are noted. No acute bone abnormality. No lytic or blastic bone lesion. Review of the MIP images confirms the above findings. CTA ABDOMEN AND PELVIS FINDINGS VASCULAR Aorta: Normal caliber aorta without aneurysm, dissection, vasculitis or significant stenosis. 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 Celiac axis is widely patent demonstrates normal anatomic configuration. No dissection. SMA: 50-75% stenosis of the origin. Distally widely patent without evidence of aneurysm or dissection. Renals: Both renal arteries are patent without evidence of aneurysm, dissection, vasculitis, fibromuscular dysplasia or significant stenosis. IMA: Patent without evidence of aneurysm, dissection, vasculitis or significant stenosis. Inflow: Patent without evidence of aneurysm, dissection, vasculitis or significant stenosis. Internal iliac arteries are patent bilaterally. Veins: No obvious venous abnormality within the limitations of this arterial phase study. Review of the MIP images confirms the above findings. NON-VASCULAR Hepatobiliary: The gallbladder is distended and there is extensive pericholecystic inflammatory change in keeping with changes of acute cholecystitis. No loculated pericholecystic fluid collections are identified. There has developed marked intra and extrahepatic biliary ductal dilation to the level of the ampulla. While a distal obstructing lesion is not clearly identified, this is not optimally evaluated on this examination. No  enhancing intrahepatic mass identified. Pancreas: Unremarkable Spleen: Unremarkable Adrenals/Urinary Tract: The adrenal glands are unremarkable. The kidneys are normal in position. Mild bilateral renal cortical atrophy, right greater than left. Multiple simple cortical cysts are seen within the kidneys bilaterally. No follow-up imaging is recommended for these lesions. No enhancing intrarenal masses are seen. No hydronephrosis. No intrarenal or ureteral calculi. The bladder is partially obscured by streak artifact, however, the visualized portion is unremarkable. Stomach/Bowel: Partial small bowel resection has been performed with wide patency of the surgical anastomosis noted within the anterior pelvis. Stomach, small bowel, and large bowel are otherwise unremarkable. Appendix absent. No free intraperitoneal gas or fluid. Lymphatic: No pathologic adenopathy within the abdomen and pelvis. Reproductive: Uterus and bilateral adnexa are unremarkable. Other: There is diastasis of the rectus abdominus musculature with marked thinning of the abdominal wall anteriorly. No frank hernia, however. Rectum unremarkable. Musculoskeletal: Right total hip arthroplasty been performed. Degenerative changes are seen within the lumbar spine and left hip. No acute bone abnormality. No lytic or blastic bone lesion. Review of the MIP images confirms the above findings. IMPRESSION: 1. No evidence of thoracic or abdominal aortic aneurysm or dissection. 2. Extensive calcification of the aortic valve leaflets. Left ventricular hypertrophy. Mild coronary artery calcification. Echocardiography may be helpful to assess the degree of valvular dysfunction. 3. Acute cholecystitis. No loculated pericholecystic fluid collections identified. 4. Interval development of marked intra and extrahepatic biliary ductal dilation to the level of the ampulla. While a distal obstructing lesion is not clearly identified, this is not optimally evaluated on this  examination. Correlation with liver enzymes is recommended. If abnormal, this could be further assessed with ERCP or MRCP examination. 5. 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 6. 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 Wide patency of the celiac and inferior mesenteric artery origins. Aortic Atherosclerosis (ICD10-I70.0).  Electronically Signed   By: Fidela Salisbury M.D.   On: 02/06/2022 21:51   DG Chest Portable 1 View  Result Date: 02/06/2022 CLINICAL DATA:  Chest pain and pressure EXAM: PORTABLE CHEST 1 VIEW COMPARISON:  05/05/2021 FINDINGS: Cardiomegaly. No acute airspace opacity. Status post left shoulder arthroplasty. Severe arthrosis of the right shoulder with high riding position of the humeral head consistent with chronic rotator cuff tear. IMPRESSION: Cardiomegaly without acute abnormality of the lungs. Electronically Signed   By: Delanna Ahmadi M.D.   On: 02/06/2022 19:38    IMPRESSION/PLAN:  88 year old female with a past history of an ampullary stricture and choledocholithiasis s/p ERCP x 2 with biliary drain placement in 2015 admitted to the hospital with chest pain 02/06/2022.  CTA showed a distended gallbladder with extensive pericholecystic inflammatory changes concerning for acute cholecystitis with intra and extrahepatic biliary ductal dilatation to the level of the ampulla.  Admission LFTs and WBC counts were within normal limits.  Overnight, she developed fever, leukocytosis with elevated LFTs concerning for biliary obstruction with ascending cholangitis. WBC count up to 17.3. LFTs now elevated with T. Bili 2.1. Alk phos 149. AST 693. ALT 482.  -NPO -IV fluids  -Agree with IV Zosyn -Emergent ERCP with Dr. Lyndel Safe, benefits and risks discussed including risk with sedation, risk of bleeding, perforation , pancreatitis and infection.  I attempted to call the patient's daughter Marzetta [name] at this time but she did not answer her phone. -Hold subcu heparin, last dose  received at 05:23 this morning. -Pantoprazole 40 mg IV every 24 hours  History of GERD, gastric and duodenal ulcers -Pantoprazole 40 mg IV every 24 hours  Anemia likely due to CKD per hematologist Dr. Lindi Adie. Bone marrow biopsy deferred secondary to age.  No overt GI bleeding.  Moderate to severe aortic stenosis.  Echo completed today, results pending.  History of breast cancer   Noralyn Pick  02/07/2022, 08:26AM    Attending physician's note   I have taken history, reviewed the chart and examined the patient. I performed a substantive portion of this encounter, including complete performance of at least one of the key components, in conjunction with the APP. I agree with the Advanced Practitioner's note, impression and recommendations.   Ascending cholangitis (likely) H/O choledocholithiasis with ampullary stricture s/p failed ERCP 2015, with subsequent rendezvous procedure with IR. Likely acute cholecystitis  Plan: -IV Zosyn -Semi emergent ERCP.  -Trend CBC, CMP -Appreciate surgical consultation.  I have discussed risks and benefits with patient's daughter over the phone in detail including small but definite risks of bleeding, perforation, septicemia, pancreatitis, risks of anesthesia.  Benefits were also discussed.   Carmell Austria, MD [facility] [phone]

## 2022-02-07 NOTE — H&P (Signed)
History and Physical    Brandy [name] AOZ:[id number] DOB: 04/12/1933 DOA: 02/06/2022  PCP: Kathyrn Lass, MD  Patient coming from: home  I have personally briefly reviewed patient's old medical records in [place]  Chief Complaint: chest /abdominal pain n/v and sob  HPI: Brandy [name] is a 88 y.o. female with medical history significant of dementia, hypertension, hyperlipidemia, aortic stenosis severe who was offered TAVR but declined,  Asthma, BRCA s/p treatment, GERD, who present to ED BIB EMS for acute onset of chest pain/epigastric abdominal pain with associated n/v and sob. Patient notes no pleuritic component to chest pain or exertional component. Per daughter who aides in history patient prior to onset tolerated po w/o difficulty and had no complaints.    ED Course:  On evaluation patient was found to have acute cholecystitis with  marked intra and extrahepatic biliary ductal dilation.  Surgery was consulted who will see patient in am.  Vitals: afeb,  BP 184/75, hr 95=128, rr 22  sat 96% on ra  Labs  ast 36, alst 22, bili 0.3 Lipase 25 Cxr:NAD EKG: sinus tachycardia  126 / LVH  with st diffuse st depression thought to be rate related Ce 41 (base 36) Patient ED course complicated by de-sat to 87 % patient was placed on [place] with recovery  CTA/chest /abd 1. No evidence of thoracic or abdominal aortic aneurysm or dissection. 2. Extensive calcification of the aortic valve leaflets. Left ventricular hypertrophy. Mild coronary artery calcification. Echocardiography may be helpful to assess the degree of valvular dysfunction. 3. Acute cholecystitis. No loculated pericholecystic fluid collections identified. 4. Interval development of marked intra and extrahepatic biliary ductal dilation to the level of the ampulla. While a distal obstructing lesion is not clearly identified, this is not optimally evaluated on this examination. Correlation with liver enzymes  is recommended. If abnormal, this could be further assessed with ERCP or MRCP examination. 5. 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 6. 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 Wide patency of the celiac and inferior mesenteric artery origins. Tx nitro, fenatnyl , zofran, zosyn Review of Systems: As per HPI otherwise 10 point review of systems negative.   Past Medical History:  Diagnosis Date   Anemia    Arthritis    shoulders   Asthma    mild, no inhalers used   Breast cancer ([place]) 12/18/11   left breast masectomy=metastatic ca in (1/1) lymph node ,invasive ductal ca,2 foci,,dcis,lymph ovascular invasion identified,surgical resection margins neg for ca,additional tissue=benign skin and subcutaneous tissue   Bronchitis    hx of;last time >73yrago   Depression    takes Celexa daily   Difficult intubation    Dysphagia    Full dentures    Gall stones 2015   GERD (gastroesophageal reflux disease)    takes Prilosec prn   H/O hiatal hernia    History of kidney stones    many yrs ago   Hx of radiation therapy 04/26/12 -06/10/12   left breast   Hyperlipidemia    takes Simvastatin daily   Hypertension    takes Amlodipine and Diovan daily   Insomnia    takes Ambien nightly   Urinary urgency     Past Surgical History:  Procedure Laterality Date   APPENDECTOMY     bladder tack     BREAST BIOPSY  1998   left    DILATION AND CURETTAGE OF UTERUS     ENDOSCOPIC RETROGRADE CHOLANGIOPANCREATOGRAPHY (  ERCP) WITH PROPOFOL N/A 02/01/2014   Procedure: ENDOSCOPIC RETROGRADE CHOLANGIOPANCREATOGRAPHY (ERCP) WITH PROPOFOL;  Surgeon: [name], MD;  Location: [facility];  Service: Endoscopy;  Laterality: N/A;   ESOPHAGOGASTRODUODENOSCOPY     ESOPHAGOGASTRODUODENOSCOPY N/A 06/26/2016   Procedure: ESOPHAGOGASTRODUODENOSCOPY (EGD);  Surgeon: [name], MD;  Location: [place];  Service: Endoscopy;  Laterality: N/A;   ESOPHAGOGASTRODUODENOSCOPY (EGD)  WITH PROPOFOL  12/19/2013   Procedure: ESOPHAGOGASTRODUODENOSCOPY (EGD) WITH PROPOFOL;  Surgeon: Beryle Beams, MD;  Location: [place];  Service: Endoscopy;;   ESOPHAGOGASTRODUODENOSCOPY (EGD) WITH PROPOFOL N/A 09/10/2016   Procedure: ESOPHAGOGASTRODUODENOSCOPY (EGD) WITH PROPOFOL;  Surgeon: [name], MD;  [facility];  Service: Endoscopy;  Laterality: N/A;   EUS  06/04/2011   Procedure: UPPER ENDOSCOPIC ULTRASOUND (EUS) LINEAR;  Surgeon: Owens Loffler, MD;  [facility]: [facility];  Service: Endoscopy;  Laterality: N/A;   EUS N/A 02/01/2014   Procedure: UPPER ENDOSCOPIC ULTRASOUND (EUS) LINEAR;  Surgeon: [name], MD;  [facility];  Service: Endoscopy;  Laterality: N/A;   EXPLORATORY LAPAROTOMY      biopsy of intra-abdominal mass   INTRAMEDULLARY (IM) NAIL INTERTROCHANTERIC Right 10/24/2018   Procedure: INTRAMEDULLARY (IM) NAIL INTERTROCHANTRIC;  Surgeon: Gaynelle Arabian, MD;  Location: [facility];  Service: Orthopedics;  Laterality: Right;   LAPAROTOMY N/A 11/17/2016   Procedure: EXPLORATORY LAPAROTOMY WITH LYSIS OF ADHESIONS, SMALL BOWEL RESECTION, REPAIR OF INCARCERATED VENTRAL INCISIONAL HERNIA, INSERTION OF BIOLOGIC MESH PATCH,APPLICATION OF WOUND VAC DRESSING;  Surgeon: Armandina Gemma, MD;  Location: [facility];  Service: General;  Laterality: N/A;   MASTECTOMY W/ SENTINEL NODE BIOPSY  12/18/2011   Procedure: MASTECTOMY WITH SENTINEL LYMPH NODE BIOPSY;  Surgeon: Rolm Bookbinder, MD;  Location: [place];  Service: General;  Laterality: Left;   PORT-A-CATH REMOVAL Right 06/15/2013   Procedure: REMOVAL PORT-A-CATH;  Surgeon: Rolm Bookbinder, MD;  Location: [place];  Service: General;  Laterality: Right;   PORTACATH PLACEMENT  01/27/2012   Procedure: INSERTION PORT-A-CATH;  Surgeon: Rolm Bookbinder, MD;  Location: [place];  Service: General;  Laterality: Right;  PORT PLACEMENT   RIGHT/LEFT HEART CATH AND CORONARY ANGIOGRAPHY N/A  05/08/2021   Procedure: RIGHT/LEFT HEART CATH AND CORONARY ANGIOGRAPHY;  Surgeon: Jettie Booze, MD;  Location: [facility];  Service: Cardiovascular;  Laterality: N/A;   TOTAL HIP ARTHROPLASTY Right 11/14/2018   Procedure: REMOVAL OF INTRAMEDULLARY NAIL AND POSTERIOR TOTAL HIP ARTHROPLASTY;  Surgeon: Gaynelle Arabian, MD;  Location: [facility];  Service: Orthopedics;  Laterality: Right;   TOTAL SHOULDER REPLACEMENT  2011   left   TUBAL LIGATION       reports that she has never smoked. She has never used smokeless tobacco. She reports that she does not drink alcohol and does not use drugs.  No Known Allergies  Family History  Problem Relation Age of Onset   Prostate cancer Father    Breast cancer Other     Prior to Admission medications   Medication Sig Start Date End Date Taking? Authorizing Provider  acetaminophen (TYLENOL) 500 MG tablet Take 1,000 mg by mouth every 6 (six) hours as needed for headache (pain).    [provider]  alendronate (FOSAMAX) 70 MG tablet Take 70 mg by mouth every Sunday. 04/01/21   [provider]  Cyanocobalamin (VITAMIN B-12 PO) Take 1 tablet by mouth daily.    [provider]  DULoxetine (CYMBALTA) 20 MG capsule Take 20 mg by mouth 2 (two) times daily. 04/17/21  [provider]  ferrous sulfate 325 (65 FE) MG tablet Take 1 tablet (325 mg total) by mouth daily. 05/09/21   Jennye Boroughs, MD  gabapentin (NEURONTIN) 300 MG capsule Take 2 capsules (600 mg total) by mouth at bedtime. 01/16/22   Nicholas Lose, MD  Multiple Vitamin (MULTIVITAMIN WITH MINERALS) TABS tablet Take 1 tablet by mouth daily.    [provider]  omeprazole (PRILOSEC) 20 MG capsule Take 20 mg by mouth daily as needed (heartburn/acid reflux).    [provider]  rOPINIRole (REQUIP) 0.25 MG tablet Take 0.25 mg by mouth at bedtime. 07/28/21   [provider]  traMADol (ULTRAM) 50 MG tablet Take 2 tablets (100 mg total) by  mouth 4 (four) times daily. 11/16/18   Edmisten, Kristie L, PA  valsartan-hydrochlorothiazide (DIOVAN-HCT) 80-12.5 MG tablet Take 1 tablet by mouth daily.    [provider]  Vitamin D, Ergocalciferol, (DRISDOL) 50000 units CAPS capsule Take 50,000 Units by mouth every Sunday.    [provider]    Physical Exam: Vitals:   02/06/22 2100 02/06/22 2130 02/06/22 2200 02/06/22 2330  BP: (!) 158/61 (!) 149/78 (!) 148/91 (!) 143/75  Pulse: (!) 109 (!) 119 (!) 122 (!) 132  Resp: (!) 26 (!) 27 (!) 29 (!) 24  Temp:      TempSrc:      SpO2: 92% (!) 87% 98% 99%  Weight:      Height:        Vitals:   02/06/22 2100 02/06/22 2130 02/06/22 2200 02/06/22 2330  BP: (!) 158/61 (!) 149/78 (!) 148/91 (!) 143/75  Pulse: (!) 109 (!) 119 (!) 122 (!) 132  Resp: (!) 26 (!) 27 (!) 29 (!) 24  Temp:      TempSrc:      SpO2: 92% (!) 87% 98% 99%  Weight:      Height:       Constitutional: NAD, calm, mild sob  Eyes: PERRL, lids and conjunctivae normal ENMT: Mucous membranes are dry. Posterior pharynx clear of any exudate or lesions.Normal dentition.  Neck: normal, supple, no masses, no thyromegaly Respiratory: clear to auscultation bilaterally, no wheezing, no crackles. Normal respiratory effort. No accessory muscle use.  Cardiovascular: Regular rate and rhythm, + murmurs / rubs / gallops. No extremity edema. 2+ pedal pulses.  Abdomen: + epigastric and RUQ tenderness, no masses palpated. No hepatosplenomegaly. Bowel sounds positive.  Musculoskeletal: no clubbing / cyanosis. No joint deformity upper and lower extremities. Good ROM, no contractures. Normal muscle tone.  Skin: no rashes, lesions, ulcers. No induration Neurologic: CN 2-12 grossly intact. Sensation intact,  Strength 5/5 in all 4.  Psychiatric: Normal judgment and insight. Alert and oriented to self and place  Normal mood.    Labs on Admission: I have personally reviewed following labs and imaging studies  CBC: Recent Labs   Lab 02/06/22 1926  WBC 11.1*  NEUTROABS 7.2  HGB 9.0*  HCT 28.4*  MCV 93.1  PLT 165   Basic Metabolic Panel: Recent Labs  Lab 02/06/22 1926  NA 139  K 4.3  CL 106  CO2 25  GLUCOSE 115*  BUN 22  CREATININE 1.39*  CALCIUM 9.1   GFR: Estimated Creatinine Clearance: 23 mL/min (A) (by C-G formula based on SCr of 1.39 mg/dL (H)). Liver Function Tests: Recent Labs  Lab 02/06/22 1926  AST 36  ALT 22  ALKPHOS 78  BILITOT 0.8  PROT 6.3*  ALBUMIN 3.2*   Recent Labs  Lab 02/06/22 1926  LIPASE 25   No results for input(s): "AMMONIA" in the last 168 hours. Coagulation Profile: No results for input(s): "INR", "PROTIME" in the last 168 hours. Cardiac Enzymes: No results for input(s): "CKTOTAL", "CKMB", "CKMBINDEX", "TROPONINI" in the last 168 hours. BNP (last 3 results) No results for input(s): "PROBNP" in the last 8760 hours. HbA1C: No results for input(s): "HGBA1C" in the last 72 hours. CBG: No results for input(s): "GLUCAP" in the last 168 hours. Lipid Profile: No results for input(s): "CHOL", "HDL", "LDLCALC", "TRIG", "CHOLHDL", "LDLDIRECT" in the last 72 hours. Thyroid Function Tests: No results for input(s): "TSH", "T4TOTAL", "FREET4", "T3FREE", "THYROIDAB" in the last 72 hours. Anemia Panel: No results for input(s): "VITAMINB12", "FOLATE", "FERRITIN", "TIBC", "IRON", "RETICCTPCT" in the last 72 hours. Urine analysis:    Component Value Date/Time   COLORURINE YELLOW 08/19/2021 0919   APPEARANCEUR CLEAR 08/19/2021 0919   LABSPEC 1.013 08/19/2021 0919   PHURINE 5.0 08/19/2021 0919   GLUCOSEU NEGATIVE 08/19/2021 0919   HGBUR NEGATIVE 08/19/2021 0919   BILIRUBINUR NEGATIVE 08/19/2021 0919   KETONESUR NEGATIVE 08/19/2021 0919   PROTEINUR NEGATIVE 08/19/2021 0919   UROBILINOGEN 1.0 10/10/2009 1100   NITRITE NEGATIVE 08/19/2021 0919   LEUKOCYTESUR NEGATIVE 08/19/2021 0919    Radiological Exams on Admission: CT Angio Chest/Abd/Pel for Dissection W and/or Wo  Contrast  Result Date: 02/06/2022 CLINICAL DATA:  Hypertension, chest pain EXAM: CT ANGIOGRAPHY CHEST, ABDOMEN AND PELVIS TECHNIQUE: Non-contrast CT of the chest was initially obtained. Multidetector CT imaging through the chest, abdomen and pelvis was performed using the standard protocol during bolus administration of intravenous contrast. Multiplanar reconstructed images and MIPs were obtained and reviewed to evaluate the vascular anatomy. RADIATION DOSE REDUCTION: This exam was performed according to the departmental dose-optimization program which includes automated exposure control, adjustment of the mA and/or kV according to patient size and/or use of iterative reconstruction technique. CONTRAST:  72m OMNIPAQUE IOHEXOL 350 MG/ML SOLN COMPARISON:  CT chest 01/30/2019, CT abdomen pelvis 11/15/2016 FINDINGS: CTA CHEST FINDINGS Cardiovascular: The thoracic aorta is normal in course and caliber. No intramural hematoma, dissection, or aneurysm. Moderate atherosclerotic calcification. Bovine arch anatomy with wide patency of the arch vasculature proximally. Global cardiac size within normal limits though left ventricular hypertrophy is noted. Extensive calcification of the aortic valve leaflets. Extensive calcification of the mitral valve annulus. Mild coronary artery calcification. Trace pericardial effusion. Central pulmonary arteries are of normal caliber. Mediastinum/Nodes: The visualized thyroid is unremarkable. No pathologic thoracic adenopathy. The esophagus is unremarkable. Small hiatal hernia. Lungs/Pleura: Trace residual left pleural effusion or pleural thickening. Mild parenchymal scarring within the left upper lobe is unchanged. No new focal pulmonary nodules or infiltrates. No pneumothorax. Central airways are widely patent. Musculoskeletal: Status post left mastectomy. Osseous structures are diffusely osteopenic. Advanced degenerative changes are seen within the right shoulder. Left shoulder  arthroplasty has been performed with anterior subluxation of the a humeral head in relation of the glenoid. Multiple healed left rib fractures are noted. No acute bone abnormality. No lytic or blastic bone lesion. Review of the MIP images confirms the above findings. CTA ABDOMEN AND PELVIS FINDINGS VASCULAR Aorta: Normal caliber aorta without aneurysm, dissection, vasculitis or significant stenosis. 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 Celiac axis is widely patent demonstrates normal anatomic configuration. No dissection. SMA: 50-75% stenosis of the origin. Distally widely patent without evidence of aneurysm or dissection. Renals: Both renal arteries are patent without evidence of aneurysm, dissection, vasculitis, fibromuscular dysplasia or significant  stenosis. IMA: Patent without evidence of aneurysm, dissection, vasculitis or significant stenosis. Inflow: Patent without evidence of aneurysm, dissection, vasculitis or significant stenosis. Internal iliac arteries are patent bilaterally. Veins: No obvious venous abnormality within the limitations of this arterial phase study. Review of the MIP images confirms the above findings. NON-VASCULAR Hepatobiliary: The gallbladder is distended and there is extensive pericholecystic inflammatory change in keeping with changes of acute cholecystitis. No loculated pericholecystic fluid collections are identified. There has developed marked intra and extrahepatic biliary ductal dilation to the level of the ampulla. While a distal obstructing lesion is not clearly identified, this is not optimally evaluated on this examination. No enhancing intrahepatic mass identified. Pancreas: Unremarkable Spleen: Unremarkable Adrenals/Urinary Tract: The adrenal glands are unremarkable. The kidneys are normal in position. Mild bilateral renal cortical atrophy, right greater than left. Multiple simple cortical cysts are seen within the kidneys bilaterally. No  follow-up imaging is recommended for these lesions. No enhancing intrarenal masses are seen. No hydronephrosis. No intrarenal or ureteral calculi. The bladder is partially obscured by streak artifact, however, the visualized portion is unremarkable. Stomach/Bowel: Partial small bowel resection has been performed with wide patency of the surgical anastomosis noted within the anterior pelvis. Stomach, small bowel, and large bowel are otherwise unremarkable. Appendix absent. No free intraperitoneal gas or fluid. Lymphatic: No pathologic adenopathy within the abdomen and pelvis. Reproductive: Uterus and bilateral adnexa are unremarkable. Other: There is diastasis of the rectus abdominus musculature with marked thinning of the abdominal wall anteriorly. No frank hernia, however. Rectum unremarkable. Musculoskeletal: Right total hip arthroplasty been performed. Degenerative changes are seen within the lumbar spine and left hip. No acute bone abnormality. No lytic or blastic bone lesion. Review of the MIP images confirms the above findings. IMPRESSION: 1. No evidence of thoracic or abdominal aortic aneurysm or dissection. 2. Extensive calcification of the aortic valve leaflets. Left ventricular hypertrophy. Mild coronary artery calcification. Echocardiography may be helpful to assess the degree of valvular dysfunction. 3. Acute cholecystitis. No loculated pericholecystic fluid collections identified. 4. Interval development of marked intra and extrahepatic biliary ductal dilation to the level of the ampulla. While a distal obstructing lesion is not clearly identified, this is not optimally evaluated on this examination. Correlation with liver enzymes is recommended. If abnormal, this could be further assessed with ERCP or MRCP examination. 5. 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 6. 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 Wide patency of the celiac and inferior mesenteric artery origins. Aortic  Atherosclerosis (ICD10-I70.0). Electronically Signed   By: Fidela Salisbury M.D.   On: 02/06/2022 21:51   DG Chest Portable 1 View  Result Date: 02/06/2022 CLINICAL DATA:  Chest pain and pressure EXAM: PORTABLE CHEST 1 VIEW COMPARISON:  05/05/2021 FINDINGS: Cardiomegaly. No acute airspace opacity. Status post left shoulder arthroplasty. Severe arthrosis of the right shoulder with high riding position of the humeral head consistent with chronic rotator cuff tear. IMPRESSION: Cardiomegaly without acute abnormality of the lungs. Electronically Signed   By: Delanna Ahmadi M.D.   On: 02/06/2022 19:38    EKG: Independently reviewed. See above  Assessment/Plan Acute Cholecystitis  Intra and Extra biliary ductal dilation  -admit to progressive care  -continue broad spectrum abx  -npo  -surgery consulted from ED -consult GI  as well  - supportive care of n/v and abdominal pain  Chest pain , Non ACS  -related to tachycardia/ Gi referred pain  -CE at baseline , however to be complete will  re-cycle   Severe Aortic Stenosis -offered TAVR patient declined   Hypoxemia -possible related to sepsis , vs element of valvular heart failure  -will repeat echo  -Cxr /ct chest:no acute finding  -strict I/o , daily weights  -check abg  -wean supplemental O2 as able   Dementia -no active issues    Hypertension  -resume home regimen  -prn anti htn  -was elevated on admit but now 134/51   Hyperlipidemia -resume tx once patient toleration po  GERD  -ppi  DVT prophylaxis: heparin  Code Status: FULL Family Communication: Greggory,Stacey (Daughter)  [phone] (Home Phone) Disposition Plan: patient  expected to be admitted greater than 2 midnights  Consults called: GI -Colleen Kennedy/ Surgery  DR Dema Severin Admission status: progressive care   Clance Boll MD [facility]   If 7PM-7AM, please contact night-coverage [email or web address] Password [email or web address]  02/07/2022, 1:04 AM

## 2022-02-07 NOTE — Consult Note (Addendum)
Brandy [name] Nov 27, 1932  [id number].    Requesting MD: Wynelle Cleveland, MD Chief Complaint/Reason for Consult: cholelithiasis, biliary obstruction  HPI:  Brandy [name] is an 88 y/o F with multiple medical co-morbidities, listed below, who presented to the ED via EMS due to cc chest pain associated with nausea and vomiting. Today during my exam she denies chest pain or abdominal pain. She denies nausea or vomiting. She is unable to tell me what surgeries she has had on her abdomen. She is not on blood thinners. She has a history of choledocholithiasis and ampullary stricture s/p failed ERCP in 2015. She then got IR PTC drain by IR and choledocholithiasis was flushed into the duodenum after biliary dilation. She had an EUS 01/2014 where no mass was identified and brushings were negative for malignancy.   ED workup significant for mild leukocytosis 11.1, normal LFTs and lipase, and CT angio/ chest/abd/pelvis ngeative for aortic dissection/aneurysm, w/ calcified aortic valve, LVH. This study showed cholelithiasis, pericholecystitis fluid, and intra and extrahepatic biliary ductal dilation to the level of the ampulla.  Due to concern for cholecystitis CCS is asked to see. She is on IV Zosyn.  ROS: Review of Systems  Constitutional:  Positive for fever. Negative for chills.  HENT: Negative.    Eyes: Negative.   Respiratory: Negative.    Cardiovascular: Negative.   Gastrointestinal: Negative.   Genitourinary: Negative.   Musculoskeletal: Negative.   Neurological: Negative.   Psychiatric/Behavioral: Negative.      Family History  Problem Relation Age of Onset   Prostate cancer Father    Breast cancer Other     Past Medical History:  Diagnosis Date   Anemia    Arthritis    shoulders   Asthma    mild, no inhalers used   Breast cancer ([facility]) 12/18/11   left breast masectomy=metastatic ca in (1/1) lymph node ,invasive ductal ca,2 foci,,dcis,lymph ovascular invasion identified,surgical  resection margins neg for ca,additional tissue=benign skin and subcutaneous tissue   Bronchitis    hx of;last time >25yrago   Depression    takes Celexa daily   Difficult intubation    Dysphagia    Full dentures    Gall stones 2015   GERD (gastroesophageal reflux disease)    takes Prilosec prn   H/O hiatal hernia    History of kidney stones    many yrs ago   Hx of radiation therapy 04/26/12 -06/10/12   left breast   Hyperlipidemia    takes Simvastatin daily   Hypertension    takes Amlodipine and Diovan daily   Insomnia    takes Ambien nightly   Urinary urgency     Past Surgical History:  Procedure Laterality Date   APPENDECTOMY     bladder tack     BREAST BIOPSY  1998   left    DILATION AND CURETTAGE OF UTERUS     ENDOSCOPIC RETROGRADE CHOLANGIOPANCREATOGRAPHY (ERCP) WITH PROPOFOL N/A 02/01/2014   Procedure: ENDOSCOPIC RETROGRADE CHOLANGIOPANCREATOGRAPHY (ERCP) WITH PROPOFOL;  Surgeon: DMilus Banister MD;  [facility];  Service: Endoscopy;  Laterality: N/A;   ESOPHAGOGASTRODUODENOSCOPY     ESOPHAGOGASTRODUODENOSCOPY N/A 06/26/2016   Procedure: ESOPHAGOGASTRODUODENOSCOPY (EGD);  Surgeon: DMilus Banister MD;  [facility]  Service: Endoscopy;  Laterality: N/A;   ESOPHAGOGASTRODUODENOSCOPY (EGD) WITH PROPOFOL  12/19/2013   Procedure: ESOPHAGOGASTRODUODENOSCOPY (EGD) WITH PROPOFOL;  Surgeon: PBeryle Beams MD;  [place] [facility]  Service: Endoscopy;;   ESOPHAGOGASTRODUODENOSCOPY (EGD) WITH PROPOFOL N/A 09/10/2016  Procedure: ESOPHAGOGASTRODUODENOSCOPY (EGD) WITH PROPOFOL;  Surgeon: [name], MD;  [facility];  Service: Endoscopy;  Laterality: N/A;   EUS  06/04/2011   Procedure: UPPER ENDOSCOPIC ULTRASOUND (EUS) LINEAR;  Surgeon: Owens Loffler, MD;  [facility];  Service: Endoscopy;  Laterality: N/A;   EUS N/A 02/01/2014   Procedure: UPPER ENDOSCOPIC ULTRASOUND (EUS) LINEAR;  Surgeon: [name], MD;  [facility];  Service: Endoscopy;  Laterality: N/A;   EXPLORATORY LAPAROTOMY      biopsy of intra-abdominal mass   INTRAMEDULLARY (IM) NAIL INTERTROCHANTERIC Right 10/24/2018   Procedure: INTRAMEDULLARY (IM) NAIL INTERTROCHANTRIC;  Surgeon: Gaynelle Arabian, MD;  [facility];  Service: Orthopedics;  Laterality: Right;   LAPAROTOMY N/A 11/17/2016   Procedure: EXPLORATORY LAPAROTOMY WITH LYSIS OF ADHESIONS, SMALL BOWEL RESECTION, REPAIR OF INCARCERATED VENTRAL INCISIONAL HERNIA, INSERTION OF BIOLOGIC MESH PATCH,APPLICATION OF WOUND VAC DRESSING;  Surgeon: Armandina Gemma, MD;  [facility];  Service: General;  Laterality: N/A;   MASTECTOMY W/ SENTINEL NODE BIOPSY  12/18/2011   Procedure: MASTECTOMY WITH SENTINEL LYMPH NODE BIOPSY;  Surgeon: Rolm Bookbinder, MD;  Location: [place];  Service: General;  Laterality: Left;   PORT-A-CATH REMOVAL Right 06/15/2013   Procedure: REMOVAL PORT-A-CATH;  Surgeon: Rolm Bookbinder, MD;  Location: [place];  Service: General;  Laterality: Right;   PORTACATH PLACEMENT  01/27/2012   Procedure: INSERTION PORT-A-CATH;  Surgeon: Rolm Bookbinder, MD;  Location: [place];  Service: General;  Laterality: Right;  PORT PLACEMENT   RIGHT/LEFT HEART CATH AND CORONARY ANGIOGRAPHY N/A 05/08/2021   Procedure: RIGHT/LEFT HEART CATH AND CORONARY ANGIOGRAPHY;  Surgeon: Jettie Booze, MD;  Location: [place] [facility];  Service: Cardiovascular;  Laterality: N/A;   TOTAL HIP ARTHROPLASTY Right 11/14/2018   Procedure: REMOVAL OF INTRAMEDULLARY NAIL AND POSTERIOR TOTAL HIP ARTHROPLASTY;  Surgeon: Gaynelle Arabian, MD;  [facility];  Service: Orthopedics;  Laterality: Right;   TOTAL SHOULDER REPLACEMENT  2011   left   TUBAL LIGATION      Social History:  reports that she has never smoked. She has never used smokeless tobacco. She reports that she does not drink alcohol and does not use drugs.  Allergies: No Known Allergies  Medications  Prior to Admission  Medication Sig Dispense Refill   acetaminophen (TYLENOL) 500 MG tablet Take 1,000 mg by mouth every 6 (six) hours as needed for headache (pain).     alendronate (FOSAMAX) 70 MG tablet Take 70 mg by mouth every Sunday.     Cyanocobalamin (VITAMIN B-12 PO) Take 1 tablet by mouth daily.     DULoxetine (CYMBALTA) 20 MG capsule Take 20 mg by mouth 2 (two) times daily.     ferrous sulfate 325 (65 FE) MG tablet Take 1 tablet (325 mg total) by mouth daily.     gabapentin (NEURONTIN) 300 MG capsule Take 2 capsules (600 mg total) by mouth at bedtime. 90 capsule 3   Multiple Vitamin (MULTIVITAMIN WITH MINERALS) TABS tablet Take 1 tablet by mouth daily.     omeprazole (PRILOSEC) 20 MG capsule Take 20 mg by mouth daily as needed (heartburn/acid reflux).     rOPINIRole (REQUIP) 0.25 MG tablet Take 0.25 mg by mouth at bedtime.     traMADol (ULTRAM) 50 MG tablet Take 2 tablets (100 mg total) by mouth 4 (four) times daily. 40 tablet 0   valsartan-hydrochlorothiazide (DIOVAN-HCT) 80-12.5 MG tablet Take 1 tablet by mouth daily.     Vitamin D, Ergocalciferol, (  DRISDOL) 50000 units CAPS capsule Take 50,000 Units by mouth every Sunday.       Physical Exam: Blood pressure (!) 125/52, pulse 78, temperature 98.7 F (37.1 C), temperature source Axillary, resp. rate 18, height _0  (1.499 m), weight 64.4 kg, SpO2 100 %. General: Pleasant elderly white female laying on hospital bed, appears stated age, NAD. HEENT: head -normocephalic, atraumatic; Eyes: pupils equal, anicteric sclerae  Neck- Trachea is midline, no thyromegaly CV- RRR, +murmur, no lower extremity edema  Pulm- breathing is non-labored on [place], +wheezes bilaterally  Abd- soft, nontender, nondistended, there is an upper midline laparotomy scar with underlying reducible incisional hernial, there is a scar in the right mid abdomen with visible and palpable PDS suture material sticking out. No peritonitis. Negative murphy's GU- deferred   MSK- UE/LE symmetrical, no cyanosis, clubbing, or edema. Neuro- non-focal exam, gait not assessed.  Psych- Alert and Oriented x3 with appropriate affect Skin: warm and dry, no rashes or lesions   Results for orders placed or performed during the hospital encounter of 02/06/22 (from the past 48 hour(s))  CBC with Differential     Status: Abnormal   Collection Time: 02/06/22  7:26 PM  Result Value Ref Range   WBC 11.1 (H) 4.0 - 10.5 K/uL   RBC 3.05 (L) 3.87 - 5.11 MIL/uL   Hemoglobin 9.0 (L) 12.0 - 15.0 g/dL   HCT 28.4 (L) 36.0 - 46.0 %   MCV 93.1 80.0 - 100.0 fL   MCH 29.5 26.0 - 34.0 pg   MCHC 31.7 30.0 - 36.0 g/dL   RDW 14.6 11.5 - 15.5 %   Platelets 188 150 - 400 K/uL   nRBC 0.0 0.0 - 0.2 %   Neutrophils Relative % 66 %   Neutro Abs 7.2 1.7 - 7.7 K/uL   Lymphocytes Relative 26 %   Lymphs Abs 2.9 0.7 - 4.0 K/uL   Monocytes Relative 6 %   Monocytes Absolute 0.7 0.1 - 1.0 K/uL   Eosinophils Relative 2 %   Eosinophils Absolute 0.2 0.0 - 0.5 K/uL   Basophils Relative 0 %   Basophils Absolute 0.0 0.0 - 0.1 K/uL   Immature Granulocytes 0 %   Abs Immature Granulocytes 0.05 0.00 - 0.07 K/uL    Comment: Performed at [facility], [address], [place], [place] [address]  Basic metabolic panel     Status: Abnormal   Collection Time: 02/06/22  7:26 PM  Result Value Ref Range   Sodium 139 135 - 145 mmol/L   Potassium 4.3 3.5 - 5.1 mmol/L   Chloride 106 98 - 111 mmol/L   CO2 25 22 - 32 mmol/L   Glucose, Bld 115 (H) 70 - 99 mg/dL    Comment: Glucose reference range applies only to samples taken after fasting for at least 8 hours.   BUN 22 8 - 23 mg/dL   Creatinine, Ser 1.39 (H) 0.44 - 1.00 mg/dL   Calcium 9.1 8.9 - 10.3 mg/dL   GFR, Estimated 37 (L) >60 mL/min    Comment: (NOTE) Calculated using the CKD-EPI Creatinine Equation (2021)    Anion gap 8 5 - 15    Comment: Performed at [facility] [address] [place], [place] [address]  Brain natriuretic peptide      Status: Abnormal   Collection Time: 02/06/22  7:26 PM  Result Value Ref Range   B Natriuretic Peptide 167.1 (H) 0.0 - 100.0 pg/mL    Comment: Performed at [facility], [place] [address], [place], [place] [address]  Troponin I (High Sensitivity)     Status: Abnormal   Collection Time: 02/06/22  7:26 PM  Result Value Ref Range   Troponin I (High Sensitivity) 36 (H) <18 ng/L    Comment: (NOTE) Elevated high sensitivity troponin I (hsTnI) values and significant  changes across serial measurements may suggest ACS but many other  chronic and acute conditions are known to elevate hsTnI results.  Refer to the "Links" section for chest pain algorithms and additional  guidance. Performed at [facility], [place] [address] [place], [place] [address]   Hepatic function panel     Status: Abnormal   Collection Time: 02/06/22  7:26 PM  Result Value Ref Range   Total Protein 6.3 (L) 6.5 - 8.1 g/dL   Albumin 3.2 (L) 3.5 - 5.0 g/dL   AST 36 15 - 41 U/L   ALT 22 0 - 44 U/L   Alkaline Phosphatase 78 38 - 126 U/L   Total Bilirubin 0.8 0.3 - 1.2 mg/dL   Bilirubin, Direct 0.3 (H) 0.0 - 0.2 mg/dL   Indirect Bilirubin 0.5 0.3 - 0.9 mg/dL    Comment: Performed at [facility] [address] [place], [place] [address]  Lipase, blood     Status: None   Collection Time: 02/06/22  7:26 PM  Result Value Ref Range   Lipase 25 11 - 51 U/L    Comment: Performed at [facility], [place] [address] [place], [place] [address]  Troponin I (High Sensitivity)     Status: Abnormal   Collection Time: 02/06/22  9:00 PM  Result Value Ref Range   Troponin I (High Sensitivity) 41 (H) <18 ng/L    Comment: (NOTE) Elevated high sensitivity troponin I (hsTnI) values and significant  changes across serial measurements may suggest ACS but many other  chronic and acute conditions are known to elevate hsTnI results.  Refer to the "Links" section for chest pain algorithms and additional   guidance. Performed at [facility], [place] [address] [place], [place] [address]   Urinalysis, Routine w reflex microscopic Urine, Clean Catch     Status: Abnormal   Collection Time: 02/07/22  5:08 AM  Result Value Ref Range   Color, Urine YELLOW YELLOW   APPearance CLEAR CLEAR   Specific Gravity, Urine 1.024 1.005 - 1.030   pH 6.0 5.0 - 8.0   Glucose, UA NEGATIVE NEGATIVE mg/dL   Hgb urine dipstick SMALL (A) NEGATIVE   Bilirubin Urine NEGATIVE NEGATIVE   Ketones, ur NEGATIVE NEGATIVE mg/dL   Protein, ur NEGATIVE NEGATIVE mg/dL   Nitrite NEGATIVE NEGATIVE   Leukocytes,Ua NEGATIVE NEGATIVE   RBC / HPF 0-5 0 - 5 RBC/hpf   WBC, UA 0-5 0 - 5 WBC/hpf   Bacteria, UA NONE SEEN NONE SEEN   Squamous Epithelial / LPF 0-5 0 - 5    Comment: Performed at [facility], [place] [address] [place], [place] [address]  Brain natriuretic peptide     Status: Abnormal   Collection Time: 02/07/22  5:49 AM  Result Value Ref Range   B Natriuretic Peptide 650.5 (H) 0.0 - 100.0 pg/mL    Comment: Performed at [facility] [address] [place], [place] [address]  Troponin I (High Sensitivity)     Status: Abnormal   Collection Time: 02/07/22  5:49 AM  Result Value Ref Range   Troponin I (High  Sensitivity) 101 (HH) <18 ng/L    Comment: DELTA CHECK NOTED CRITICAL RESULT CALLED TO, READ BACK BY AND VERIFIED WITH L.LOWERY, RN [phone] 08.05.23 MRIVET (NOTE) Elevated high sensitivity troponin I (hsTnI) values and significant  changes across serial measurements may suggest ACS but many other  chronic and acute conditions are known to elevate hsTnI results.  Refer to the "Links" section for chest pain algorithms and additional  guidance. Performed at [facility], [place] [address], [place], [place] [address]   Culture, blood (Routine X 2) w Reflex to ID Panel     Status: None (Preliminary result)   Collection Time: 02/07/22  5:49 AM   Specimen: BLOOD  Result Value Ref Range   Specimen  Description BLOOD LEFT ANTECUBITAL    Special Requests      BOTTLES DRAWN AEROBIC AND ANAEROBIC Blood Culture adequate volume   Culture      NO GROWTH < 12 HOURS Performed at [facility], [place] [address] [place], [place] [address]    Report Status PENDING   Culture, blood (Routine X 2) w Reflex to ID Panel     Status: None (Preliminary result)   Collection Time: 02/07/22  5:49 AM   Specimen: BLOOD RIGHT HAND  Result Value Ref Range   Specimen Description BLOOD RIGHT HAND    Special Requests      BOTTLES DRAWN AEROBIC AND ANAEROBIC Blood Culture adequate volume   Culture      NO GROWTH < 12 HOURS Performed at [facility], [place] [address] [place], [place] [address]    Report Status PENDING   Procalcitonin - Baseline     Status: None   Collection Time: 02/07/22  5:49 AM  Result Value Ref Range   Procalcitonin 21.87 ng/mL    Comment:        Interpretation: PCT >= 10 ng/mL: Important systemic inflammatory response, almost exclusively due to severe bacterial sepsis or septic shock. (NOTE)       Sepsis PCT Algorithm           Lower Respiratory Tract                                      Infection PCT Algorithm    ----------------------------     ----------------------------         PCT < 0.25 ng/mL                PCT < 0.10 ng/mL          Strongly encourage             Strongly discourage   discontinuation of antibiotics    initiation of antibiotics    ----------------------------     -----------------------------       PCT 0.25 - 0.50 ng/mL            PCT 0.10 - 0.25 ng/mL               OR       >80% decrease in PCT            Discourage initiation of                                            antibiotics  Encourage discontinuation           of antibiotics    ----------------------------     -----------------------------         PCT >= 0.50 ng/mL              PCT 0.26 - 0.50 ng/mL                AND       <80% decrease in PCT             Encourage initiation  of                                             antibiotics       Encourage continuation           of antibiotics    ----------------------------     -----------------------------        PCT >= 0.50 ng/mL                  PCT > 0.50 ng/mL               AND         increase in PCT                  Strongly encourage                                      initiation of antibiotics    Strongly encourage escalation           of antibiotics                                     -----------------------------                                           PCT <= 0.25 ng/mL                                                 OR                                        > 80% decrease in PCT                                      Discontinue / Do not initiate                                             antibiotics  Performed at [facility], [place] [address] [place], [place] [address]   Comprehensive metabolic panel     Status: Abnormal   Collection Time: 02/07/22  5:49 AM  Result Value Ref Range   Sodium 139 135 - 145 mmol/L   Potassium 4.0 3.5 - 5.1 mmol/L   Chloride 107 98 - 111 mmol/L   CO2 25 22 - 32 mmol/L   Glucose, Bld 162 (H) 70 - 99 mg/dL    Comment: Glucose reference range applies only to samples taken after fasting for at least 8 hours.   BUN 24 (H) 8 - 23 mg/dL   Creatinine, Ser 1.54 (H) 0.44 - 1.00 mg/dL   Calcium 8.4 (L) 8.9 - 10.3 mg/dL   Total Protein 5.4 (L) 6.5 - 8.1 g/dL   Albumin 2.5 (L) 3.5 - 5.0 g/dL   AST 693 (H) 15 - 41 U/L   ALT 482 (H) 0 - 44 U/L   Alkaline Phosphatase 149 (H) 38 - 126 U/L   Total Bilirubin 2.1 (H) 0.3 - 1.2 mg/dL   GFR, Estimated 33 (L) >60 mL/min    Comment: (NOTE) Calculated using the CKD-EPI Creatinine Equation (2021)    Anion gap 7 5 - 15    Comment: Performed at [facility], [place] [address], [place], [place] [address]  Magnesium     Status: None   Collection Time: 02/07/22  5:49 AM  Result Value Ref Range   Magnesium 1.7 1.7 - 2.4  mg/dL    Comment: Performed at [facility] [address], [place], [place] [address]  Phosphorus     Status: Abnormal   Collection Time: 02/07/22  5:49 AM  Result Value Ref Range   Phosphorus 4.7 (H) 2.5 - 4.6 mg/dL    Comment: Performed at [facility] [address], [place], [place] [address]  CBC with Differential/Platelet     Status: Abnormal   Collection Time: 02/07/22  5:49 AM  Result Value Ref Range   WBC 17.3 (H) 4.0 - 10.5 K/uL   RBC 2.74 (L) 3.87 - 5.11 MIL/uL   Hemoglobin 8.2 (L) 12.0 - 15.0 g/dL   HCT 25.2 (L) 36.0 - 46.0 %   MCV 92.0 80.0 - 100.0 fL   MCH 29.9 26.0 - 34.0 pg   MCHC 32.5 30.0 - 36.0 g/dL   RDW 15.2 11.5 - 15.5 %   Platelets 172 150 - 400 K/uL   nRBC 0.0 0.0 - 0.2 %   Neutrophils Relative % 87 %   Neutro Abs 15.1 (H) 1.7 - 7.7 K/uL   Lymphocytes Relative 6 %   Lymphs Abs 1.1 0.7 - 4.0 K/uL   Monocytes Relative 6 %   Monocytes Absolute 1.0 0.1 - 1.0 K/uL   Eosinophils Relative 0 %   Eosinophils Absolute 0.0 0.0 - 0.5 K/uL   Basophils Relative 0 %   Basophils Absolute 0.0 0.0 - 0.1 K/uL   Immature Granulocytes 1 %   Abs Immature Granulocytes 0.10 (H) 0.00 - 0.07 K/uL    Comment: Performed at [facility] [address] [place], [place] [address]  Protime-INR     Status: None   Collection Time: 02/07/22  5:49 AM  Result Value Ref Range   Prothrombin Time 14.7 11.4 - 15.2 seconds   INR 1.2 0.8 - 1.2    Comment: (NOTE) INR goal varies based on device and disease states. Performed at [facility], [place] [address] [place],  [address]   Bilirubin, direct     Status: Abnormal   Collection Time: 02/07/22  5:49 AM  Result Value Ref Range   Bilirubin, Direct 1.0 (  H) 0.0 - 0.2 mg/dL    Comment: Performed at [facility], [place] [address] [place], [place] [address]  Blood gas, venous     Status: Abnormal   Collection Time: 02/07/22  5:56 AM  Result Value Ref Range   pH, Ven 7.42 7.25 - 7.43   pCO2, Ven 41 (L)  44 - 60 mmHg   pO2, Ven 98 (H) 32 - 45 mmHg   Bicarbonate 26.5 20.0 - 28.0 mmol/L   Acid-Base Excess 2.0 0.0 - 2.0 mmol/L   O2 Saturation 97 %   Patient temperature 37.7    Collection site VEIN    Drawn by [phone]     Comment: Performed at [facility] [address] [place], [place] [address]  Lactic acid, plasma     Status: None   Collection Time: 02/07/22  7:37 AM  Result Value Ref Range   Lactic Acid, Venous 1.4 0.5 - 1.9 mmol/L    Comment: Performed at [facility] [address], [place], [place] [address]  Troponin I (High Sensitivity)     Status: Abnormal   Collection Time: 02/07/22  7:37 AM  Result Value Ref Range   Troponin I (High Sensitivity) 98 (H) <18 ng/L    Comment: (NOTE) Elevated high sensitivity troponin I (hsTnI) values and significant  changes across serial measurements may suggest ACS but many other  chronic and acute conditions are known to elevate hsTnI results.  Refer to the "Links" section for chest pain algorithms and additional  guidance. Performed at [facility], [place] [address] [place],  [address]    CT Angio Chest/Abd/Pel for Dissection W and/or Wo Contrast  Result Date: 02/06/2022 CLINICAL DATA:  Hypertension, chest pain EXAM: CT ANGIOGRAPHY CHEST, ABDOMEN AND PELVIS TECHNIQUE: Non-contrast CT of the chest was initially obtained. Multidetector CT imaging through the chest, abdomen and pelvis was performed using the standard protocol during bolus administration of intravenous contrast. Multiplanar reconstructed images and MIPs were obtained and reviewed to evaluate the vascular anatomy. RADIATION DOSE REDUCTION: This exam was performed according to the departmental dose-optimization program which includes automated exposure control, adjustment of the mA and/or kV according to patient size and/or use of iterative reconstruction technique. CONTRAST:  53m OMNIPAQUE IOHEXOL 350 MG/ML SOLN COMPARISON:  CT chest 01/30/2019, CT abdomen  pelvis 11/15/2016 FINDINGS: CTA CHEST FINDINGS Cardiovascular: The thoracic aorta is normal in course and caliber. No intramural hematoma, dissection, or aneurysm. Moderate atherosclerotic calcification. Bovine arch anatomy with wide patency of the arch vasculature proximally. Global cardiac size within normal limits though left ventricular hypertrophy is noted. Extensive calcification of the aortic valve leaflets. Extensive calcification of the mitral valve annulus. Mild coronary artery calcification. Trace pericardial effusion. Central pulmonary arteries are of normal caliber. Mediastinum/Nodes: The visualized thyroid is unremarkable. No pathologic thoracic adenopathy. The esophagus is unremarkable. Small hiatal hernia. Lungs/Pleura: Trace residual left pleural effusion or pleural thickening. Mild parenchymal scarring within the left upper lobe is unchanged. No new focal pulmonary nodules or infiltrates. No pneumothorax. Central airways are widely patent. Musculoskeletal: Status post left mastectomy. Osseous structures are diffusely osteopenic. Advanced degenerative changes are seen within the right shoulder. Left shoulder arthroplasty has been performed with anterior subluxation of the a humeral head in relation of the glenoid. Multiple healed left rib fractures are noted. No acute bone abnormality. No lytic or blastic bone lesion. Review of the MIP images confirms the above findings. CTA ABDOMEN AND PELVIS FINDINGS VASCULAR Aorta: Normal  caliber aorta without aneurysm, dissection, vasculitis or significant stenosis. 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 Celiac axis is widely patent demonstrates normal anatomic configuration. No dissection. SMA: 50-75% stenosis of the origin. Distally widely patent without evidence of aneurysm or dissection. Renals: Both renal arteries are patent without evidence of aneurysm, dissection, vasculitis, fibromuscular dysplasia or significant  stenosis. IMA: Patent without evidence of aneurysm, dissection, vasculitis or significant stenosis. Inflow: Patent without evidence of aneurysm, dissection, vasculitis or significant stenosis. Internal iliac arteries are patent bilaterally. Veins: No obvious venous abnormality within the limitations of this arterial phase study. Review of the MIP images confirms the above findings. NON-VASCULAR Hepatobiliary: The gallbladder is distended and there is extensive pericholecystic inflammatory change in keeping with changes of acute cholecystitis. No loculated pericholecystic fluid collections are identified. There has developed marked intra and extrahepatic biliary ductal dilation to the level of the ampulla. While a distal obstructing lesion is not clearly identified, this is not optimally evaluated on this examination. No enhancing intrahepatic mass identified. Pancreas: Unremarkable Spleen: Unremarkable Adrenals/Urinary Tract: The adrenal glands are unremarkable. The kidneys are normal in position. Mild bilateral renal cortical atrophy, right greater than left. Multiple simple cortical cysts are seen within the kidneys bilaterally. No follow-up imaging is recommended for these lesions. No enhancing intrarenal masses are seen. No hydronephrosis. No intrarenal or ureteral calculi. The bladder is partially obscured by streak artifact, however, the visualized portion is unremarkable. Stomach/Bowel: Partial small bowel resection has been performed with wide patency of the surgical anastomosis noted within the anterior pelvis. Stomach, small bowel, and large bowel are otherwise unremarkable. Appendix absent. No free intraperitoneal gas or fluid. Lymphatic: No pathologic adenopathy within the abdomen and pelvis. Reproductive: Uterus and bilateral adnexa are unremarkable. Other: There is diastasis of the rectus abdominus musculature with marked thinning of the abdominal wall anteriorly. No frank hernia, however. Rectum  unremarkable. Musculoskeletal: Right total hip arthroplasty been performed. Degenerative changes are seen within the lumbar spine and left hip. No acute bone abnormality. No lytic or blastic bone lesion. Review of the MIP images confirms the above findings. IMPRESSION: 1. No evidence of thoracic or abdominal aortic aneurysm or dissection. 2. Extensive calcification of the aortic valve leaflets. Left ventricular hypertrophy. Mild coronary artery calcification. Echocardiography may be helpful to assess the degree of valvular dysfunction. 3. Acute cholecystitis. No loculated pericholecystic fluid collections identified. 4. Interval development of marked intra and extrahepatic biliary ductal dilation to the level of the ampulla. While a distal obstructing lesion is not clearly identified, this is not optimally evaluated on this examination. Correlation with liver enzymes is recommended. If abnormal, this could be further assessed with ERCP or MRCP examination. 5. 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 6. 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 Wide patency of the celiac and inferior mesenteric artery origins. Aortic Atherosclerosis (ICD10-I70.0). Electronically Signed   By: Fidela Salisbury M.D.   On: 02/06/2022 21:51   DG Chest Portable 1 View  Result Date: 02/06/2022 CLINICAL DATA:  Chest pain and pressure EXAM: PORTABLE CHEST 1 VIEW COMPARISON:  05/05/2021 FINDINGS: Cardiomegaly. No acute airspace opacity. Status post left shoulder arthroplasty. Severe arthrosis of the right shoulder with high riding position of the humeral head consistent with chronic rotator cuff tear. IMPRESSION: Cardiomegaly without acute abnormality of the lungs. Electronically Signed   By: Delanna Ahmadi M.D.   On: 02/06/2022 19:38    Assessment/Plan 88 y/o F with  PMH choledocholithiasis and benign ampullary stricture who presented yesterday with epigastric pain, nausea, and vomiting.  - today she denies pain but  has sxs concerning for developing cholangitis w/ fever (100.6), WBC 17 (from 11) and acute rise in LFTs (AST 693, ALT 482, AP 149, total bilirubin 2.1). Cr slightly increased today 1.51 from 1.3. lactic acid is normal 1.4. - imaging as described above concerning for recurrent ampullary stricture vs choledocholithiasis. GI has evaluated the patient and plans for ERCP today, we will follow the results of this procedure.  - In addition to CBD obstruction she may also have cholecystitis, though pericholecystic fluid could also be reactive. She is completely non-tender on exam. Given extensive abdominal wall scarring from previous surgery laparoscopic cholecystectomy would be very difficult. Her aortic stenosis and current oxygen requirement are also concerning when considering general anesthesia for cholecystectomy. We will follow results of ERCP prior to further recommendations.   Abd surgical history per chart review: tubal ligation, appendetomy, laparotomy with SBR and ventral hernia repair with mesh by Dr. Harlow Asa in 2018 for incarcerated hernia (path w/o malignancy). Chart also mentions laparotomy and Bx of intra-abd mass?   PMH mastectomy 12/18/2011 by Dr. Donne Hazel, also got neoadjuvant chemo/radiation  PMH gastric/duodenal ulcers, GERD HTN HLD  Aortic stenosis  Asthma  Depression   I reviewed nursing notes, Consultant GI notes, hospitalist notes, last 24 h vitals and pain scores, last 48 h intake and output, last 24 h labs and trends, and last 24 h imaging results.  Jill Alexanders, PA-C [facility] 02/07/2022, 10:03 AM Please see Amion for pager number during day hours 7:00am-4:30pm or 7:00am -11:30am on weekends

## 2022-02-07 NOTE — Progress Notes (Signed)
Performing Surgical PCR and Resp. Panel.

## 2022-02-07 NOTE — Anesthesia Procedure Notes (Signed)
Procedure Name: Intubation Date/Time: 02/07/2022 12:36 PM  Performed by: Barrington Ellison, CRNAPre-anesthesia Checklist: Patient identified, Emergency Drugs available, Suction available and Patient being monitored Patient Re-evaluated:Patient Re-evaluated prior to induction Oxygen Delivery Method: Circle System Utilized Preoxygenation: Pre-oxygenation with 100% oxygen Induction Type: IV induction Ventilation: Mask ventilation without difficulty Laryngoscope Size: Mac and 3 Grade View: Grade I Tube type: Oral Tube size: 7.0 mm Number of attempts: 1 Airway Equipment and Method: Stylet and Oral airway Placement Confirmation: ETT inserted through vocal cords under direct vision, positive ETCO2 and breath sounds checked- equal and bilateral Secured at: 21 cm Tube secured with: Tape Dental Injury: Teeth and Oropharynx as per pre-operative assessment

## 2022-02-07 NOTE — Transfer of Care (Signed)
Immediate Anesthesia Transfer of Care Note  Patient: Brandy [name]  Procedure(s) Performed: ENDOSCOPIC RETROGRADE CHOLANGIOPANCREATOGRAPHY (ERCP)  Patient Location: PACU  Anesthesia Type:General  Level of Consciousness: awake, alert  and oriented  Airway & Oxygen Therapy: Patient Spontanous Breathing and Patient connected to nasal cannula oxygen  Post-op Assessment: Report given to RN  Post vital signs: Reviewed and stable  Last Vitals:  Vitals Value Taken Time  BP 135/52 02/07/22 1411  Temp    Pulse 75 02/07/22 1412  Resp    SpO2 97 % 02/07/22 1412  Vitals shown include unvalidated device data.  Last Pain:  Vitals:   02/07/22 1201  TempSrc: Temporal  PainSc: 0-No pain         Complications: No notable events documented.

## 2022-02-07 NOTE — Progress Notes (Signed)
Urinalysis sent to lab.

## 2022-02-07 NOTE — Anesthesia Postprocedure Evaluation (Signed)
Anesthesia Post Note  Patient: Brandy [name]  Procedure(s) Performed: ENDOSCOPIC RETROGRADE CHOLANGIOPANCREATOGRAPHY (ERCP) SPHINCTEROTOMY BILIARY DILATION REMOVAL OF STONES BILIARY STENT PLACEMENT     Patient location during evaluation: PACU Anesthesia Type: General Level of consciousness: awake and alert Pain management: pain level controlled Vital Signs Assessment: post-procedure vital signs reviewed and stable Respiratory status: spontaneous breathing, nonlabored ventilation, respiratory function stable and patient connected to nasal cannula oxygen Cardiovascular status: blood pressure returned to baseline and stable Postop Assessment: no apparent nausea or vomiting Anesthetic complications: no   No notable events documented.  Last Vitals:  Vitals:   02/07/22 1432 02/07/22 1500  BP: (!) 167/94 (!) 182/70  Pulse: 74 83  Resp: (!) 26 20  Temp:  37.1 C  SpO2: 100% 100%    Last Pain:  Vitals:   02/07/22 1500  TempSrc:   PainSc: 0-No pain                 Effie Berkshire

## 2022-02-07 NOTE — Anesthesia Preprocedure Evaluation (Addendum)
Anesthesia Evaluation  Patient identified by MRN, date of birth, ID band Patient awake    Reviewed: Allergy & Precautions, NPO status , Patient's Chart, lab work & pertinent test results  Airway Mallampati: I  TM Distance: <3 FB Neck ROM: Full    Dental  (+) Edentulous Upper, Edentulous Lower   Pulmonary asthma ,     + decreased breath sounds      Cardiovascular hypertension, Pt. on medications  Rhythm:Regular Rate:Normal  Echo: 1. Left ventricular ejection fraction, by estimation, is 70 to 75%. The  left ventricle has hyperdynamic function. The left ventricle has no  regional wall motion abnormalities. There is mild left ventricular  hypertrophy. Left ventricular diastolic  parameters are consistent with Grade I diastolic dysfunction (impaired  relaxation). Elevated left atrial pressure.  2. Right ventricular systolic function is normal. The right ventricular  size is normal.  3. Mild mitral valve regurgitation. Moderate to severe mitral annular  calcification.  4. AV is thickened, calcified with restricted motion. Peak and mean  gradients through the valve are 64 and 38 mm Hg respectively.  Dimensionless index is 0.27 Consistent with moderate to severe AS>  Compared to echo report from 2021, mean gradient is  increased (19m to 38 mm ) . The aortic valve is abnormal. Aortic valve  regurgitation is not visualized.  5. The inferior vena cava is normal in size with greater than 50%  respiratory variability, suggesting right atrial pressure of 3 mmHg.    Neuro/Psych PSYCHIATRIC DISORDERS Anxiety Depression    GI/Hepatic Neg liver ROS, hiatal hernia, PUD, GERD  Medicated,  Endo/Other  negative endocrine ROS  Renal/GU      Musculoskeletal  (+) Arthritis ,   Abdominal Normal abdominal exam  (+)   Peds  Hematology   Anesthesia Other Findings   Reproductive/Obstetrics                             Anesthesia Physical Anesthesia Plan  ASA: 4  Anesthesia Plan: General   Post-op Pain Management:    Induction: Intravenous  PONV Risk Score and Plan: 1 and Ondansetron  Airway Management Planned: Oral ETT  Additional Equipment: None  Intra-op Plan:   Post-operative Plan: Extubation in OR  Informed Consent: I have reviewed the patients History and Physical, chart, labs and discussed the procedure including the risks, benefits and alternatives for the proposed anesthesia with the patient or authorized representative who has indicated his/her understanding and acceptance.     Dental advisory given  Plan Discussed with: CRNA  Anesthesia Plan Comments:        Anesthesia Quick Evaluation

## 2022-02-07 NOTE — Progress Notes (Signed)
Initial Nutrition Assessment  DOCUMENTATION CODES:   Not applicable  INTERVENTION:   -RD will follow for diet advancement and add supplements as appropriate  NUTRITION DIAGNOSIS:   Inadequate oral intake related to altered GI function as evidenced by NPO status.  GOAL:   Patient will meet greater than or equal to 90% of their needs  MONITOR:   PO intake, Supplement acceptance, Diet advancement  REASON FOR ASSESSMENT:   Malnutrition Screening Tool    ASSESSMENT:   Pt with medical history significant of dementia, hypertension, hyperlipidemia, aortic stenosis severe who was offered TAVR but declined,  Asthma, BRCA s/p treatment, GERD, who presents for acute onset of chest pain/epigastric abdominal pain with associated n/v and sob.  Pt admitted with acute cholecystitis.   Reviewed I/O's: -810 ml x 24 hours  UOP: 860 ml x 24 hours  Pt unavailable at time of visit. Attempted to speak with pt via call to hospital room phone, however, unable to reach. RD unable to obtain further nutrition-related history or complete nutrition-focused physical exam at this time.    Per GI notes, plan for emergent ERCP.   Pt currently NPO.    Reviewed wt hx; no wt loss over the past 6 months.   Medications reviewed and include lactated ringers infusion @ 75 ml/hr.   Labs reviewed: Phos: 4.7, CBGS: 128.   Diet Order:   Diet Order             Diet NPO time specified  Diet effective now                   EDUCATION NEEDS:   No education needs have been identified at this time  Skin:  Skin Assessment: Reviewed RN Assessment  Last BM:  02/06/22  Height:   Ht Readings from Last 1 Encounters:  02/07/22 '4\' 11"'  (1.499 m)    Weight:   Wt Readings from Last 1 Encounters:  02/07/22 64.4 kg    Ideal Body Weight:  44.7 kg  BMI:  Body mass index is 28.68 kg/m.  Estimated Nutritional Needs:   Kcal:  1600-1800  Protein:  85-100 grams  Fluid:  > 1.6 L    Loistine Chance,  RD, LDN, [place] Registered Dietitian II Certified Diabetes Care and Education Specialist Please refer to [facility] for RD and/or RD on-call/weekend/after hours pager

## 2022-02-07 NOTE — Progress Notes (Signed)
    SHORT PROGRESS NOTE  88 year old female with severe aortic stenosis who has declined a TAVR, hypertension, history of breast cancer status posttreatment, asthma, hyperlipidemia who presented to the hospital for chest and abdominal pain associated with nausea and vomiting.  The patient also had shortness of breath. Pox was noted to drop to 87% on room air. Imaging was suggestive of cholecystitis and severely dilated bile ducts. 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  The patient was started on Zosyn, made n.p.o. and given IV fluids.  Today's Vitals   02/07/22 1413 02/07/22 1423 02/07/22 1432 02/07/22 1500  BP: (!) 135/52 (!) 175/62 (!) 167/94 (!) 182/70  Pulse: 78 78 74 83  Resp: (!) 23 (!) 21 (!) 26 20  Temp: (!) 97.5 F (36.4 C)   98.8 F (37.1 C)  TempSrc: Axillary     SpO2: 100% 100% 100% 100%  Weight:      Height:      PainSc: 0-No pain 0-No pain 0-No pain 0-No pain   Body mass index is 28.68 kg/m.    Assessment and Plan: Principal Problem:   Acute cholecystitis with sepsis Choledocholithiasis, cholangitis - The patient was tachycardic, tachypneic, febrile to 100.6 degrees and had leukocytosis - Continue Zosyn - Status post ERCP with sphincterotomy and removal of gallstones from the bile duct - On clear liquids - General surgery following as well-no clear plan for surgery as of yet  Active Problems:  AKI - Prior creatinine from 11/22 was 0.69 - Creatinine when admitted was 1.39 and has risen to 1.54 - Continue to hold valsartan and HCTZ - AKI is likely due to sepsis and prerenal in setting of  valsartan and HCTZ - Continue slow IV fluids - Follow-up in a.m.  Mild hypoxia - Resolved - Respiratory panel negative - CTA chest unremarkable    Essential hypertension -Holding valsartan/HCTZ  Normocytic anemia - Follow in a.m.    History of breast cancer is post radiation and chemotherapy   History of partial gastrectomy      Debbe Odea, MD 02/07/2022 [facility]

## 2022-02-08 ENCOUNTER — Inpatient Hospital Stay (HOSPITAL_COMMUNITY): Payer: Medicare HMO

## 2022-02-08 ENCOUNTER — Encounter (HOSPITAL_COMMUNITY): Payer: Self-pay | Admitting: Internal Medicine

## 2022-02-08 DIAGNOSIS — Z01810 Encounter for preprocedural cardiovascular examination: Secondary | ICD-10-CM | POA: Diagnosis not present

## 2022-02-08 DIAGNOSIS — I350 Nonrheumatic aortic (valve) stenosis: Secondary | ICD-10-CM | POA: Diagnosis not present

## 2022-02-08 DIAGNOSIS — R079 Chest pain, unspecified: Secondary | ICD-10-CM

## 2022-02-08 DIAGNOSIS — K810 Acute cholecystitis: Secondary | ICD-10-CM | POA: Diagnosis not present

## 2022-02-08 LAB — COMPREHENSIVE METABOLIC PANEL
ALT: 269 U/L — ABNORMAL HIGH (ref 0–44)
AST: 185 U/L — ABNORMAL HIGH (ref 15–41)
Albumin: 2.5 g/dL — ABNORMAL LOW (ref 3.5–5.0)
Alkaline Phosphatase: 119 U/L (ref 38–126)
Anion gap: 9 (ref 5–15)
BUN: 20 mg/dL (ref 8–23)
CO2: 25 mmol/L (ref 22–32)
Calcium: 8.3 mg/dL — ABNORMAL LOW (ref 8.9–10.3)
Chloride: 105 mmol/L (ref 98–111)
Creatinine, Ser: 1.28 mg/dL — ABNORMAL HIGH (ref 0.44–1.00)
GFR, Estimated: 41 mL/min — ABNORMAL LOW (ref >60)
Glucose, Bld: 88 mg/dL (ref 70–99)
Potassium: 3.5 mmol/L (ref 3.5–5.1)
Sodium: 139 mmol/L (ref 135–145)
Total Bilirubin: 1.2 mg/dL (ref 0.3–1.2)
Total Protein: 5.3 g/dL — ABNORMAL LOW (ref 6.5–8.1)

## 2022-02-08 LAB — ECHOCARDIOGRAM COMPLETE
AV Mean grad: 31 mmHg
AV Peak grad: 53.9 mmHg
Ao pk vel: 3.67 m/s
Height: 59 in
S' Lateral: 3.1 cm
Weight: 2306.89 oz

## 2022-02-08 LAB — CBC
HCT: 26.9 % — ABNORMAL LOW (ref 36.0–46.0)
Hemoglobin: 8.6 g/dL — ABNORMAL LOW (ref 12.0–15.0)
MCH: 29.5 pg (ref 26.0–34.0)
MCHC: 32 g/dL (ref 30.0–36.0)
MCV: 92.1 fL (ref 80.0–100.0)
Platelets: 155 10*3/uL (ref 150–400)
RBC: 2.92 MIL/uL — ABNORMAL LOW (ref 3.87–5.11)
RDW: 15.2 % (ref 11.5–15.5)
WBC: 13.4 10*3/uL — ABNORMAL HIGH (ref 4.0–10.5)
nRBC: 0 % (ref 0.0–0.2)

## 2022-02-08 LAB — GLUCOSE, CAPILLARY
Glucose-Capillary: 110 mg/dL — ABNORMAL HIGH (ref 70–99)
Glucose-Capillary: 88 mg/dL (ref 70–99)

## 2022-02-08 MED ORDER — FERROUS SULFATE 325 (65 FE) MG PO TABS
325.0000 mg | ORAL_TABLET | Freq: Every day | ORAL | Status: DC
  Administered 2022-02-08 – 2022-02-09 (×2): 325 mg via ORAL
  Filled 2022-02-08 (×2): qty 1

## 2022-02-08 NOTE — Progress Notes (Signed)
[facility] Progress Note  Patient: Brandy [name]     CXK:[id number]  DOA: 02/06/2022   PCP: Kathyrn Lass, MD       Brief hospital course:   88 year old female with severe aortic stenosis who has declined a TAVR, hypertension, history of breast cancer status posttreatment, asthma, hyperlipidemia who presented to the hospital for chest and abdominal pain associated with nausea and vomiting.  The patient also had shortness of breath. Pox was noted to drop to 87% on room air. Imaging was suggestive of cholecystitis and severely dilated bile ducts. 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  The patient was started on Zosyn, made n.p.o. and given IV fluids.    Subjective:  No abdominal pain or nausea. She has a feeling of restless legs right now.  Assessment and Plan: Principal Problem:   Acute cholecystitis with sepsis Choledocholithiasis, cholangitis - The patient was tachycardic, tachypneic, febrile to 100.6 degrees and had leukocytosis - Continue Zosyn - Status post ERCP with sphincterotomy and removal of gallstones from the bile duct - On clear liquids - General surgery following as well- asking for further management of aortic stenosis   Active Problems:   AKI - Prior creatinine from 11/22 was 0.69 - Creatinine when admitted was 1.39 and has risen to 1.54 and then improved to 1.28 - Continue to hold valsartan and HCTZ - AKI is likely due to sepsis and prerenal in setting of valsartan and HCTZ -- hold IVF   Mild hypoxia - Resolved - Respiratory panel negative - CTA chest unremarkable     Essential  hypertension -Holding valsartan/HCTZ   Normocytic anemia - Follow in a.m.     History of breast cancer is post radiation and chemotherapy   History of partial gastrectomy         DVT prophylaxis:  Place and maintain sequential compression device Start: 02/08/22 1023     Code Status: Full Code  Consultants: vascular surgery, general surgery Level of Care: Level of care: Progressive Disposition Plan:  Status is: Inpatient Remains inpatient appropriate because: acute cholecysitis   Objective:   Vitals:   02/08/22 0000 02/08/22 0400 02/08/22 0721 02/08/22 1022  BP: (!) 139/55 (!) 133/58 (!) 132/59 (!) 138/48  Pulse: 70 78 86 88  Resp: '17 20 20 20  '$ Temp: 97.8 F (36.6 C) 97.8 F (36.6 C) 98.5 F (36.9 C) 98.9 F (37.2 C)  TempSrc: Axillary Oral Oral Oral  SpO2: 100% 99% 98% 91%  Weight:  65.4 kg    Height:       Filed Weights   02/06/22 2008 02/07/22 0200 02/08/22 0400  Weight: 60.8 kg 64.4 kg 65.4 kg   Exam: General exam: Appears comfortable  HEENT: PERRLA, oral mucosa moist, no sclera icterus or thrush Respiratory system: Clear to auscultation. Respiratory effort normal. Cardiovascular system: S1 & S2 heard, regular rate and rhythm 3/6 murmur at RUS border Gastrointestinal system: Abdomen soft, non-tender, nondistended. Normal bowel sounds   Central nervous system: Alert and oriented. No focal neurological deficits. Extremities: No cyanosis, clubbing or edema Skin: No rashes or ulcers Psychiatry:  Mood & affect appropriate.    Imaging and lab data was personally reviewed  CBC: Recent Labs  Lab 02/06/22 1926 02/07/22 0549 02/08/22 0347  WBC 11.1* 17.3* 13.4*  NEUTROABS 7.2 15.1*  --   HGB 9.0* 8.2* 8.6*  HCT 28.4* 25.2* 26.9*  MCV 93.1 92.0 92.1  PLT 188 172 829   Basic Metabolic Panel: Recent Labs  Lab 02/06/22 1926 02/07/22 0549 02/08/22 0347  NA 139 139 139  K 4.3 4.0 3.5  CL 106 107 105  CO2 '25 25 25  '$ GLUCOSE 115* 162* 88  BUN 22 24*  20  CREATININE 1.39* 1.54* 1.28*  CALCIUM 9.1 8.4* 8.3*  MG  --  1.7  --   PHOS  --  4.7*  --    GFR: Estimated Creatinine Clearance: 25.9 mL/min (A) (by C-G formula based on SCr of 1.28 mg/dL (H)).  Scheduled Meds:  DULoxetine  20 mg Oral BID   gabapentin  600 mg Oral QHS   pantoprazole  40 mg Oral Daily   rOPINIRole  0.25 mg Oral QHS   Continuous Infusions:  piperacillin-tazobactam (ZOSYN)  IV 3.375 g (02/08/22 0628)     LOS: 1 day   Author: Debbe Odea  02/08/2022 2:06 PM

## 2022-02-08 NOTE — Progress Notes (Signed)
[facility] Progress Note  1 Day Post-Op  Subjective: CC:   Denies abd pain, fever, chills. States she is independent and makes her own decisions but does discuss medical decisions with her son and daughter.  Objective: Vital signs in last 24 hours: Temp:  [97.5 F (36.4 C)-98.9 F (37.2 C)] 98.9 F (37.2 C) (08/06 1022) Pulse Rate:  [70-88] 88 (08/06 1022) Resp:  [16-26] 20 (08/06 1022) BP: (125-182)/(48-94) 138/48 (08/06 1022) SpO2:  [91 %-100 %] 91 % (08/06 1022) Weight:  [65.4 kg] 65.4 kg (08/06 0400) Last BM Date : 02/07/22  Intake/Output from previous day: 08/05 0701 - 08/06 0700 In: 1851 [P.O.:480; I.V.:1365.8; IV Piggyback:5.1] Out: 1573 [Urine:1570; Blood:3] Intake/Output this shift: Total I/O In: 1220.9 [P.O.:350; I.V.:812.9; IV Piggyback:58] Out: -   PE: Gen:  Alert, NAD, pleasant Card:  Regular rate and rhythm Pulm:  Normal effort Abd: Soft, non-tender, non-distended, no peritonitis Skin: warm and dry, no rashes  Psych: A&Ox3   Lab Results:  Recent Labs    02/07/22 0549 02/08/22 0347  WBC 17.3* 13.4*  HGB 8.2* 8.6*  HCT 25.2* 26.9*  PLT 172 155   BMET Recent Labs    02/07/22 0549 02/08/22 0347  NA 139 139  K 4.0 3.5  CL 107 105  CO2 25 25  GLUCOSE 162* 88  BUN 24* 20  CREATININE 1.54* 1.28*  CALCIUM 8.4* 8.3*   PT/INR Recent Labs    02/07/22 0549  LABPROT 14.7  INR 1.2   CMP     Component Value Date/Time   NA 139 02/08/2022 0347   NA 139 02/23/2017 1334   NA 137 01/14/2017 1403   K 3.5 02/08/2022 0347   K 4.3 01/14/2017 1403   CL 105 02/08/2022 0347   CL 108 (H) 12/28/2012 1343   CO2 25 02/08/2022 0347   CO2 22 01/14/2017 1403   GLUCOSE 88 02/08/2022 0347   GLUCOSE 109 01/14/2017 1403   GLUCOSE 99 12/28/2012 1343   BUN 20 02/08/2022 0347   BUN 20 02/23/2017 1334   BUN 32.1 (H) 01/14/2017 1403   CREATININE 1.28 (H) 02/08/2022 0347   CREATININE 1.1 01/14/2017 1403   CALCIUM 8.3 (L) 02/08/2022 0347    CALCIUM 10.0 01/14/2017 1403   PROT 5.3 (L) 02/08/2022 0347   PROT 6.7 01/14/2017 1403   ALBUMIN 2.5 (L) 02/08/2022 0347   ALBUMIN 3.0 (L) 01/14/2017 1403   AST 185 (H) 02/08/2022 0347   AST 8 01/14/2017 1403   ALT 269 (H) 02/08/2022 0347   ALT 8 01/14/2017 1403   ALKPHOS 119 02/08/2022 0347   ALKPHOS 84 01/14/2017 1403   BILITOT 1.2 02/08/2022 0347   BILITOT 0.23 01/14/2017 1403   GFRNONAA 41 (L) 02/08/2022 0347   GFRAA >60 11/16/2018 0604   Lipase     Component Value Date/Time   LIPASE 25 02/06/2022 1926       Studies/Results: DG ERCP  Result Date: 02/07/2022 CLINICAL DATA:  ERCP EXAM: ERCP TECHNIQUE: Multiple spot images obtained with the fluoroscopic device and submitted for interpretation post-procedure. FLUOROSCOPY: Refer to separate report COMPARISON:  None Available. FINDINGS: A total of 9 fluoroscopic spot images taken during ERCP are submitted for review. Initial images demonstrate scope overlying the abdomen with wire catheterization of the common bile duct. Contrast injection of the common bile duct demonstrates a dilated common bile duct. Balloon sweep(s) of the common bile duct is performed. A plastic biliary stent is left in place and the scope was withdrawn. IMPRESSION:  ERCP images as described. Refer to procedure report for full details. These images were submitted for radiologic interpretation only. Please see the procedural report for the amount of contrast and the fluoroscopy time utilized. Electronically Signed   By: Albin Felling M.D.   On: 02/07/2022 14:34   DG CHEST PORT 1 VIEW  Result Date: 02/07/2022 CLINICAL DATA:  Fluid overload. EXAM: PORTABLE CHEST 1 VIEW COMPARISON:  02/06/2022 FINDINGS: 0818 hours. The cardio pericardial silhouette is enlarged. Interstitial markings are diffusely coarsened with chronic features. Minimal atelectasis noted left base. Bones are diffusely demineralized. Left shoulder replacement. Telemetry leads overlie the chest.  IMPRESSION: 1. Enlargement of the cardiopericardial silhouette. 2. Chronic interstitial coarsening with left base atelectasis. Electronically Signed   By: Misty Stanley M.D.   On: 02/07/2022 10:10   CT Angio Chest/Abd/Pel for Dissection W and/or Wo Contrast  Result Date: 02/06/2022 CLINICAL DATA:  Hypertension, chest pain EXAM: CT ANGIOGRAPHY CHEST, ABDOMEN AND PELVIS TECHNIQUE: Non-contrast CT of the chest was initially obtained. Multidetector CT imaging through the chest, abdomen and pelvis was performed using the standard protocol during bolus administration of intravenous contrast. Multiplanar reconstructed images and MIPs were obtained and reviewed to evaluate the vascular anatomy. RADIATION DOSE REDUCTION: This exam was performed according to the departmental dose-optimization program which includes automated exposure control, adjustment of the mA and/or kV according to patient size and/or use of iterative reconstruction technique. CONTRAST:  68m OMNIPAQUE IOHEXOL 350 MG/ML SOLN COMPARISON:  CT chest 01/30/2019, CT abdomen pelvis 11/15/2016 FINDINGS: CTA CHEST FINDINGS Cardiovascular: The thoracic aorta is normal in course and caliber. No intramural hematoma, dissection, or aneurysm. Moderate atherosclerotic calcification. Bovine arch anatomy with wide patency of the arch vasculature proximally. Global cardiac size within normal limits though left ventricular hypertrophy is noted. Extensive calcification of the aortic valve leaflets. Extensive calcification of the mitral valve annulus. Mild coronary artery calcification. Trace pericardial effusion. Central pulmonary arteries are of normal caliber. Mediastinum/Nodes: The visualized thyroid is unremarkable. No pathologic thoracic adenopathy. The esophagus is unremarkable. Small hiatal hernia. Lungs/Pleura: Trace residual left pleural effusion or pleural thickening. Mild parenchymal scarring within the left upper lobe is unchanged. No new focal pulmonary  nodules or infiltrates. No pneumothorax. Central airways are widely patent. Musculoskeletal: Status post left mastectomy. Osseous structures are diffusely osteopenic. Advanced degenerative changes are seen within the right shoulder. Left shoulder arthroplasty has been performed with anterior subluxation of the a humeral head in relation of the glenoid. Multiple healed left rib fractures are noted. No acute bone abnormality. No lytic or blastic bone lesion. Review of the MIP images confirms the above findings. CTA ABDOMEN AND PELVIS FINDINGS VASCULAR Aorta: Normal caliber aorta without aneurysm, dissection, vasculitis or significant stenosis. 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 Celiac axis is widely patent demonstrates normal anatomic configuration. No dissection. SMA: 50-75% stenosis of the origin. Distally widely patent without evidence of aneurysm or dissection. Renals: Both renal arteries are patent without evidence of aneurysm, dissection, vasculitis, fibromuscular dysplasia or significant stenosis. IMA: Patent without evidence of aneurysm, dissection, vasculitis or significant stenosis. Inflow: Patent without evidence of aneurysm, dissection, vasculitis or significant stenosis. Internal iliac arteries are patent bilaterally. Veins: No obvious venous abnormality within the limitations of this arterial phase study. Review of the MIP images confirms the above findings. NON-VASCULAR Hepatobiliary: The gallbladder is distended and there is extensive pericholecystic inflammatory change in keeping with changes of acute cholecystitis. No loculated pericholecystic fluid collections are identified. There  has developed marked intra and extrahepatic biliary ductal dilation to the level of the ampulla. While a distal obstructing lesion is not clearly identified, this is not optimally evaluated on this examination. No enhancing intrahepatic mass identified. Pancreas: Unremarkable Spleen:  Unremarkable Adrenals/Urinary Tract: The adrenal glands are unremarkable. The kidneys are normal in position. Mild bilateral renal cortical atrophy, right greater than left. Multiple simple cortical cysts are seen within the kidneys bilaterally. No follow-up imaging is recommended for these lesions. No enhancing intrarenal masses are seen. No hydronephrosis. No intrarenal or ureteral calculi. The bladder is partially obscured by streak artifact, however, the visualized portion is unremarkable. Stomach/Bowel: Partial small bowel resection has been performed with wide patency of the surgical anastomosis noted within the anterior pelvis. Stomach, small bowel, and large bowel are otherwise unremarkable. Appendix absent. No free intraperitoneal gas or fluid. Lymphatic: No pathologic adenopathy within the abdomen and pelvis. Reproductive: Uterus and bilateral adnexa are unremarkable. Other: There is diastasis of the rectus abdominus musculature with marked thinning of the abdominal wall anteriorly. No frank hernia, however. Rectum unremarkable. Musculoskeletal: Right total hip arthroplasty been performed. Degenerative changes are seen within the lumbar spine and left hip. No acute bone abnormality. No lytic or blastic bone lesion. Review of the MIP images confirms the above findings. IMPRESSION: 1. No evidence of thoracic or abdominal aortic aneurysm or dissection. 2. Extensive calcification of the aortic valve leaflets. Left ventricular hypertrophy. Mild coronary artery calcification. Echocardiography may be helpful to assess the degree of valvular dysfunction. 3. Acute cholecystitis. No loculated pericholecystic fluid collections identified. 4. Interval development of marked intra and extrahepatic biliary ductal dilation to the level of the ampulla. While a distal obstructing lesion is not clearly identified, this is not optimally evaluated on this examination. Correlation with liver enzymes is recommended. If abnormal,  this could be further assessed with ERCP or MRCP examination. 5. 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 6. 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 Wide patency of the celiac and inferior mesenteric artery origins. Aortic Atherosclerosis (ICD10-I70.0). Electronically Signed   By: Fidela Salisbury M.D.   On: 02/06/2022 21:51   DG Chest Portable 1 View  Result Date: 02/06/2022 CLINICAL DATA:  Chest pain and pressure EXAM: PORTABLE CHEST 1 VIEW COMPARISON:  05/05/2021 FINDINGS: Cardiomegaly. No acute airspace opacity. Status post left shoulder arthroplasty. Severe arthrosis of the right shoulder with high riding position of the humeral head consistent with chronic rotator cuff tear. IMPRESSION: Cardiomegaly without acute abnormality of the lungs. Electronically Signed   By: Delanna Ahmadi M.D.   On: 02/06/2022 19:38    Anti-infectives: Anti-infectives (From admission, onward)    Start     Dose/Rate Route Frequency Ordered Stop   02/07/22 0600  piperacillin-tazobactam (ZOSYN) IVPB 3.375 g  Status:  Discontinued        3.375 g 100 mL/hr over 30 Minutes Intravenous Every 6 hours 02/07/22 0509 02/07/22 0512   02/07/22 0600  piperacillin-tazobactam (ZOSYN) IVPB 3.375 g        3.375 g 12.5 mL/hr over 240 Minutes Intravenous Every 8 hours 02/07/22 0512 02/12/22 0559   02/06/22 2215  piperacillin-tazobactam (ZOSYN) IVPB 3.375 g        3.375 g 100 mL/hr over 30 Minutes Intravenous  Once 02/06/22 2208 02/06/22 2332        Assessment/Plan Recurrent choledocholithiasis causing ascending cholangitis  -  PMH choledocholithiasis and benign ampullary stricture who presented 8/4 with epigastric pain, nausea, and vomiting.   -  developed fever, leukocytosis, elevated LFTs over 24 hours >> taken yesterday for ERCP sphincterotomy, sphincteroplasty, balloon extraction of choledocho and biliary plastic stenting.  - while recurrent choledocholithiasis and ascending cholangitis is certainly an  indication for cholecystectomy, I am concerned about the cardiac risks associated with general anesthesia in this elderly female with known significant aortic stenosis. I will ask cardiology to weigh in on her operative risk. Her fever has resolved, WBC downtrending, LFTs improving so clinically her infection is improving w/ clearance of her common duct and she has no sxs of cholecystitis. But without cholecystectomy she would be at risk for ongoing sxs or recurrent infections related to cholelithiasis.     LOS: 1 day   I reviewed Consultant [facility] notes, hospitalist notes, last 24 h vitals and pain scores, last 48 h intake and output, last 24 h labs and trends, and last 24 h imaging results.    Obie Dredge, PA-C [facility] Please see Amion for pager number during day hours 7:00am-4:30pm

## 2022-02-08 NOTE — Consult Note (Signed)
Cardiology Consultation:   Patient ID: Brandy [name] MRN: [id number]; DOB: 1933/03/27  Admit date: 02/06/2022 Date of Consult: 02/08/2022  PCP:  Kathyrn Lass, MD   [facility] Providers Cardiologist:  Donato Heinz, MD  Structural Heart:  Early Osmond, MD {   Patient Profile:   Brandy [name] is a 88 y.o. female with a hx of mod to severe AS HTN, hx breast cancer, moderate to severe MAC with mild mitral regurgitation who is being seen 02/08/2022 for pre-op eval prior to possible cholecystectomy at the request of Dr. Wynelle Cleveland.  History of Present Illness:   Patient has a history of aortic stenosis.  Most recent echocardiogram November 2022 showed ejection fraction 70 to 75%, mild left ventricular hypertrophy, grade 1 diastolic dysfunction, mild mitral regurgitation, moderate to severe aortic stenosis with mean gradient 38 mmHg, aortic valve area 0.8 cm and dimensionless index 0.27.  Cardiac catheterization November 2022 showed no coronary disease.  She was seen by Dr. Ali Lowe in Structural heart clinic after an episode of syncope.  TAVR work-up was initiated but patient ultimately declined.  She has some dyspnea with more vigorous activities but not routine activities.  No orthopnea, PND, pedal edema, exertional chest pain or recurrent syncope.  Patient was admitted with chest and abdominal pain.  CTA revealed acute cholecystitis with hepatic bile duct dilatation.  She underwent ERCP with sphincterotomy and removal of gallstones from the bile duct.  General surgery feels as though she will require cholecystectomy and cardiology asked to evaluate preoperatively.   Past Medical History:  Diagnosis Date   Anemia    Arthritis    shoulders   Asthma    mild, no inhalers used   Breast cancer ([place]) 12/18/11   left breast masectomy=metastatic ca in (1/1) lymph node ,invasive ductal ca,2 foci,,dcis,lymph ovascular invasion identified,surgical resection margins neg for ca,additional  tissue=benign skin and subcutaneous tissue   Bronchitis    hx of;last time >47yrago   Depression    takes Celexa daily   Difficult intubation    Dysphagia    Full dentures    Gall stones 2015   GERD (gastroesophageal reflux disease)    takes Prilosec prn   H/O hiatal hernia    History of kidney stones    many yrs ago   Hx of radiation therapy 04/26/12 -06/10/12   left breast   Hyperlipidemia    takes Simvastatin daily   Hypertension    takes Amlodipine and Diovan daily   Insomnia    takes Ambien nightly   Urinary urgency     Past Surgical History:  Procedure Laterality Date   APPENDECTOMY     BILIARY DILATION  02/07/2022   Procedure: BILIARY DILATION;  Surgeon: GJackquline Denmark MD;  [facility];  Service: Gastroenterology;;   BILIARY STENT PLACEMENT  02/07/2022   Procedure: BILIARY STENT PLACEMENT;  Surgeon: GJackquline Denmark MD;  [facility];  Service: Gastroenterology;;   bladder tack     BREAST BIOPSY  1998   left    DILATION AND CURETTAGE OF UTERUS     ENDOSCOPIC RETROGRADE CHOLANGIOPANCREATOGRAPHY (ERCP) WITH PROPOFOL N/A 02/01/2014   Procedure: ENDOSCOPIC RETROGRADE CHOLANGIOPANCREATOGRAPHY (ERCP) WITH PROPOFOL;  Surgeon: DMilus Banister MD;  [facility];  Service: Endoscopy;  Laterality: N/A;   ERCP N/A 02/07/2022   Procedure: ENDOSCOPIC RETROGRADE CHOLANGIOPANCREATOGRAPHY (ERCP);  Surgeon: GJackquline Denmark MD;  [facility];  Service: Gastroenterology;  Laterality: N/A;   ESOPHAGOGASTRODUODENOSCOPY     ESOPHAGOGASTRODUODENOSCOPY N/A  06/26/2016   Procedure: ESOPHAGOGASTRODUODENOSCOPY (EGD);  Surgeon: [name], MD;  Location: [place];  Service: Endoscopy;  Laterality: N/A;   ESOPHAGOGASTRODUODENOSCOPY (EGD) WITH PROPOFOL  12/19/2013   Procedure: ESOPHAGOGASTRODUODENOSCOPY (EGD) WITH PROPOFOL;  Surgeon: Beryle Beams, MD;  Location: [place];  Service: Endoscopy;;   ESOPHAGOGASTRODUODENOSCOPY (EGD) WITH PROPOFOL N/A  09/10/2016   Procedure: ESOPHAGOGASTRODUODENOSCOPY (EGD) WITH PROPOFOL;  Surgeon: [name], MD;  [facility];  Service: Endoscopy;  Laterality: N/A;   EUS  06/04/2011   Procedure: UPPER ENDOSCOPIC ULTRASOUND (EUS) LINEAR;  Surgeon: Owens Loffler, MD;  [facility];  Service: Endoscopy;  Laterality: N/A;   EUS N/A 02/01/2014   Procedure: UPPER ENDOSCOPIC ULTRASOUND (EUS) LINEAR;  Surgeon: [name], MD;  [facility];  Service: Endoscopy;  Laterality: N/A;   EXPLORATORY LAPAROTOMY      biopsy of intra-abdominal mass   INTRAMEDULLARY (IM) NAIL INTERTROCHANTERIC Right 10/24/2018   Procedure: INTRAMEDULLARY (IM) NAIL INTERTROCHANTRIC;  Surgeon: Gaynelle Arabian, MD;  [facility]: [facility];  Service: Orthopedics;  Laterality: Right;   LAPAROTOMY N/A 11/17/2016   Procedure: EXPLORATORY LAPAROTOMY WITH LYSIS OF ADHESIONS, SMALL BOWEL RESECTION, REPAIR OF INCARCERATED VENTRAL INCISIONAL HERNIA, INSERTION OF BIOLOGIC MESH PATCH,APPLICATION OF WOUND VAC DRESSING;  Surgeon: Armandina Gemma, MD;  Location: [facility];  Service: General;  Laterality: N/A;   MASTECTOMY W/ SENTINEL NODE BIOPSY  12/18/2011   Procedure: MASTECTOMY WITH SENTINEL LYMPH NODE BIOPSY;  Surgeon: Rolm Bookbinder, MD;  Location: [place];  Service: General;  Laterality: Left;   PORT-A-CATH REMOVAL Right 06/15/2013   Procedure: REMOVAL PORT-A-CATH;  Surgeon: Rolm Bookbinder, MD;  Location: [place];  Service: General;  Laterality: Right;   PORTACATH PLACEMENT  01/27/2012   Procedure: INSERTION PORT-A-CATH;  Surgeon: Rolm Bookbinder, MD;  Location: [place];  Service: General;  Laterality: Right;  PORT PLACEMENT   REMOVAL OF STONES  02/07/2022   Procedure: REMOVAL OF STONES;  Surgeon: Jackquline Denmark, MD;  Location: [facility] END[facility]COPY;  Service: Gastroenterology;;   RIGHT/LEFT HEART CATH AND CORONARY ANGIOGRAPHY N/A 05/08/2021   Procedure: RIGHT/LEFT HEART CATH AND CORONARY ANGIOGRAPHY;   Surgeon: Jettie Booze, MD;  Location: [place] [facility];  Service: Cardiovascular;  Laterality: N/A;   SPHINCTEROTOMY  02/07/2022   Procedure: SPHINCTEROTOMY;  Surgeon: Jackquline Denmark, MD;  [facility];  Service: Gastroenterology;;   TOTAL HIP ARTHROPLASTY Right 11/14/2018   Procedure: REMOVAL OF INTRAMEDULLARY NAIL AND POSTERIOR TOTAL HIP ARTHROPLASTY;  Surgeon: Gaynelle Arabian, MD;  [facility];  Service: Orthopedics;  Laterality: Right;   TOTAL SHOULDER REPLACEMENT  2011   left   TUBAL LIGATION       Inpatient Medications: Scheduled Meds:  DULoxetine  20 mg Oral BID   gabapentin  600 mg Oral QHS   pantoprazole  40 mg Oral Daily   rOPINIRole  0.25 mg Oral QHS   Continuous Infusions:  piperacillin-tazobactam (ZOSYN)  IV 3.375 g (02/08/22 0628)   PRN Meds: acetaminophen **OR** acetaminophen, albuterol, metoprolol tartrate, ondansetron **OR** ondansetron (ZOFRAN) IV, oxyCODONE  Allergies:   No Known Allergies  Social History:   Social History   Socioeconomic History   Marital status: Married    Spouse name: Not on file   Number of children: Not on file   Years of education: Not on file   Highest education level: Not on file  Occupational History   Occupation: retired  Tobacco Use   Smoking status: Never   Smokeless tobacco: Never  Vaping  Use   Vaping Use: Never used  Substance and Sexual Activity   Alcohol use: No   Drug use: No   Sexual activity: Yes    Birth control/protection: Surgical    Comment: mensus age 79, 1st pregnancy 32, no hrt gg4,p3, 1 babay lived a few hours complications  Other Topics Concern   Not on file  Social History Narrative   Not on file   Social Determinants of Health   Financial Resource Strain: Not on file  Food Insecurity: Unknown (10/29/2018)   Hunger Vital Sign    Worried About Running Out of Food in the Last Year: Patient refused    [place] in the Last Year: Patient refused  Transportation Needs: Not  on file  Physical Activity: Unknown (10/29/2018)   Exercise Vital Sign    Days of Exercise per Week: Patient refused    Minutes of Exercise per Session: Patient refused  Stress: Not on file  Social Connections: Unknown (10/29/2018)   Social Connection and Isolation Panel [NHANES]    Frequency of Communication with Friends and Family: Patient refused    Frequency of Social Gatherings with Friends and Family: Patient refused    Attends Religious Services: Patient refused    Active Member of Clubs or Organizations: Patient refused    Attends Archivist Meetings: Patient refused    Marital Status: Patient refused  Intimate Partner Violence: Unknown (10/29/2018)   Humiliation, Afraid, Rape, and Kick questionnaire    Fear of Current or Ex-Partner: Patient refused    Emotionally Abused: Patient refused    Physically Abused: Patient refused    Sexually Abused: Patient refused    Family History:    Family History  Problem Relation Age of Onset   Prostate cancer Father    Breast cancer Other      ROS:  Please see the history of present illness.  Recent abdominal pain, nausea and vomiting and diarrhea.  All other ROS reviewed and negative.     Physical Exam/Data:   Vitals:   02/08/22 0000 02/08/22 0400 02/08/22 0721 02/08/22 1022  BP: (!) 139/55 (!) 133/58 (!) 132/59 (!) 138/48  Pulse: 70 78 86 88  Resp: _0 Temp: 97.8 F (36.6 C) 97.8 F (36.6 C) 98.5 F (36.9 C) 98.9 F (37.2 C)  TempSrc: Axillary Oral Oral Oral  SpO2: 100% 99% 98% 91%  Weight:  65.4 kg    Height:        Intake/Output Summary (Last 24 hours) at 02/08/2022 1114 Last data filed at 02/08/2022 0810 Gross per 24 hour  Intake 3071.84 ml  Output 1573 ml  Net 1498.84 ml      02/08/2022    4:00 AM 02/07/2022    2:00 AM 02/06/2022    8:08 PM  Last 3 Weights  Weight (lbs) 144 lb 2.9 oz 141 lb 15.6 oz 134 lb  Weight (kg) 65.4 kg 64.4 kg 60.782 kg     Body mass index is 29.12 kg/m.  General:   Well nourished, well developed, elderly in no acute distress HEENT: normal Neck: no JVD Vascular: No carotid bruits; Distal pulses 2+ bilaterally Cardiac: Regular rate and rhythm, 3/6 systolic murmur left sternal border, S2 is diminished. Lungs: Diminished breath sounds throughout Abd: soft, mild tenderness to palpation right upper quadrant. Ext: no edema Musculoskeletal:  No deformities, BUE and BLE strength normal and equal Skin: warm and dry  Neuro:  CNs 2-12 intact, no focal abnormalities noted Psych:  Normal affect   EKG:  The EKG was personally reviewed and demonstrates: Sinus rhythm with PVCs and run of PAT. Telemetry:  Telemetry was personally reviewed and demonstrates: Sinus tachycardia, left ventricular hypertrophy with repolarization abnormality.  Inferolateral T wave changes slightly more prominent compared to previous.  CTA/chest /abd 1. No evidence of thoracic or abdominal aortic aneurysm or dissection. 2. Extensive calcification of the aortic valve leaflets. Left ventricular hypertrophy. Mild coronary artery calcification. Echocardiography may be helpful to assess the degree of valvular dysfunction. 3. Acute cholecystitis. No loculated pericholecystic fluid collections identified. 4. Interval development of marked intra and extrahepatic biliary ductal dilation to the level of the ampulla. While a distal obstructing lesion is not clearly identified, this is not optimally evaluated on this examination. Correlation with liver enzymes is recommended. If abnormal, this could be further assessed with ERCP or MRCP examination. 5. 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 6. 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 Wide patency of the celiac and inferior mesenteric artery origins.  Relevant CV Studies: Cardiac cath  05/2021   No angiographically apparent coronary artery disease.   Ao sat 94%, PA sat 63%, PA pressure 26/6, mean PA pressure 13 mmHg,  mean pulmonary capillary wedge pressure 5 mmHg, cardiac output 5.4 L/min, cardiac index 3.39.   Severe right subclavian tortuosity.  If cardiac cath was needed in the future, would not use right radial approach.   Did not cross aortic valve.  TTE 05/06/21 IMPRESSIONS   1. Left ventricular ejection fraction, by estimation, is 70 to 75%. The  left ventricle has hyperdynamic function. The left ventricle has no  regional wall motion abnormalities. There is mild left ventricular  hypertrophy. Left ventricular diastolic  parameters are consistent with Grade I diastolic dysfunction (impaired  relaxation). Elevated left atrial pressure.   2. Right ventricular systolic function is normal. The right ventricular  size is normal.   3. Mild mitral valve regurgitation. Moderate to severe mitral annular  calcification.   4. AV is thickened, calcified with restricted motion. Peak and mean  gradients through the valve are 64 and 38 mm Hg respectively.  Dimensionless index is 0.27 Consistent with moderate to severe AS>  Compared to echo report from 2021, mean gradient is  increased (49m to 38 mm ) . The aortic valve is abnormal. Aortic valve  regurgitation is not visualized.   5. The inferior vena cava is normal in size with greater than 50%  respiratory variability, suggesting right atrial pressure of 3 mmHg.   FINDINGS   Left Ventricle: Left ventricular ejection fraction, by estimation, is 70  to 75%. The left ventricle has hyperdynamic function. The left ventricle  has no regional wall motion abnormalities. The left ventricular internal  cavity size was normal in size.  There is mild left ventricular hypertrophy. Left ventricular diastolic  parameters are consistent with Grade I diastolic dysfunction (impaired  relaxation). Elevated left atrial pressure.   Right Ventricle: The right ventricular size is normal. Right vetricular  wall thickness was not assessed. Right ventricular systolic function  is  normal.   Left Atrium: Left atrial size was normal in size.   Right Atrium: Right atrial size was normal in size.   Pericardium: There is no evidence of pericardial effusion.   Mitral Valve: There is moderate thickening of the mitral valve leaflet(s).  Moderate to severe mitral annular calcification. Mild mitral valve  regurgitation. MV peak gradient, 10.7 mmHg. The mean mitral valve gradient  is 4.0 mmHg.   Tricuspid Valve: The tricuspid valve is normal in structure. Tricuspid  valve regurgitation is trivial.   Aortic Valve: AV is thickened, calcified with restricted motion. Peak and  mean gradients through the valve are 64 and 38 mm Hg respectively.  Dimensionless index is 0.27 Consistent with moderate to severe AS>  Compared to echo report from 2021, mean  gradient is increased (59m to 38 mm ). The aortic valve is abnormal.  Aortic valve regurgitation is not visualized. Aortic valve mean gradient  measures 34.5 mmHg. Aortic valve peak gradient measures 56.4 mmHg. Aortic  valve area, by VTI measures 0.76 cm.   Pulmonic Valve: The pulmonic valve was not well visualized. Pulmonic valve  regurgitation is not visualized.   Aorta: The aortic root and ascending aorta are structurally normal, with  no evidence of dilitation.   Venous: The inferior vena cava is normal in size with greater than 50%  respiratory variability, suggesting right atrial pressure of 3 mmHg.   IAS/Shunts: No atrial level shunt detected by color flow Doppler.     Laboratory Data:  High Sensitivity Troponin:   Recent Labs  Lab 02/06/22 1926 02/06/22 2100 02/07/22 0549 02/07/22 0737  TROPONINIHS 36* 41* 101* 98*     Chemistry Recent Labs  Lab 02/06/22 1926 02/07/22 0549 02/08/22 0347  NA 139 139 139  K 4.3 4.0 3.5  CL 106 107 105  CO2 _0 GLUCOSE 115* 162* 88  BUN 22 24* 20  CREATININE 1.39* 1.54* 1.28*  CALCIUM 9.1 8.4* 8.3*  MG  --  1.7  --   GFRNONAA 37* 33* 41*  ANIONGAP  _1 Recent Labs  Lab 02/06/22 1926 02/07/22 0549 02/08/22 0347  PROT 6.3* 5.4* 5.3*  ALBUMIN 3.2* 2.5* 2.5*  AST 36 693* 185*  ALT 22 482* 269*  ALKPHOS 78 149* 119  BILITOT 0.8 2.1* 1.2    Hematology Recent Labs  Lab 02/06/22 1926 02/07/22 0549 02/08/22 0347  WBC 11.1* 17.3* 13.4*  RBC 3.05* 2.74* 2.92*  HGB 9.0* 8.2* 8.6*  HCT 28.4* 25.2* 26.9*  MCV 93.1 92.0 92.1  MCH 29.5 29.9 29.5  MCHC 31.7 32.5 32.0  RDW 14.6 15.2 15.2  PLT 188 172 155    BNP Recent Labs  Lab 02/06/22 1926 02/07/22 0549  BNP 167.1* 650.5*      Radiology/Studies:  DG ERCP  Result Date: 02/07/2022 CLINICAL DATA:  ERCP EXAM: ERCP TECHNIQUE: Multiple spot images obtained with the fluoroscopic device and submitted for interpretation post-procedure. FLUOROSCOPY: Refer to separate report COMPARISON:  None Available. FINDINGS: A total of 9 fluoroscopic spot images taken during ERCP are submitted for review. Initial images demonstrate scope overlying the abdomen with wire catheterization of the common bile duct. Contrast injection of the common bile duct demonstrates a dilated common bile duct. Balloon sweep(s) of the common bile duct is performed. A plastic biliary stent is left in place and the scope was withdrawn. IMPRESSION: ERCP images as described. Refer to procedure report for full details. These images were submitted for radiologic interpretation only. Please see the procedural report for the amount of contrast and the fluoroscopy time utilized. Electronically Signed   By: YAlbin FellingM.D.   On: 02/07/2022 14:34   DG CHEST PORT 1 VIEW  Result Date: 02/07/2022 CLINICAL DATA:  Fluid overload. EXAM: PORTABLE CHEST 1 VIEW COMPARISON:  02/06/2022 FINDINGS: 0818 hours. The cardio pericardial silhouette is enlarged. Interstitial markings are diffusely coarsened  with chronic features. Minimal atelectasis noted left base. Bones are diffusely demineralized. Left shoulder replacement. Telemetry leads  overlie the chest. IMPRESSION: 1. Enlargement of the cardiopericardial silhouette. 2. Chronic interstitial coarsening with left base atelectasis. Electronically Signed   By: Misty Stanley M.D.   On: 02/07/2022 10:10   CT Angio Chest/Abd/Pel for Dissection W and/or Wo Contrast  Result Date: 02/06/2022 CLINICAL DATA:  Hypertension, chest pain EXAM: CT ANGIOGRAPHY CHEST, ABDOMEN AND PELVIS TECHNIQUE: Non-contrast CT of the chest was initially obtained. Multidetector CT imaging through the chest, abdomen and pelvis was performed using the standard protocol during bolus administration of intravenous contrast. Multiplanar reconstructed images and MIPs were obtained and reviewed to evaluate the vascular anatomy. RADIATION DOSE REDUCTION: This exam was performed according to the departmental dose-optimization program which includes automated exposure control, adjustment of the mA and/or kV according to patient size and/or use of iterative reconstruction technique. CONTRAST:  13m OMNIPAQUE IOHEXOL 350 MG/ML SOLN COMPARISON:  CT chest 01/30/2019, CT abdomen pelvis 11/15/2016 FINDINGS: CTA CHEST FINDINGS Cardiovascular: The thoracic aorta is normal in course and caliber. No intramural hematoma, dissection, or aneurysm. Moderate atherosclerotic calcification. Bovine arch anatomy with wide patency of the arch vasculature proximally. Global cardiac size within normal limits though left ventricular hypertrophy is noted. Extensive calcification of the aortic valve leaflets. Extensive calcification of the mitral valve annulus. Mild coronary artery calcification. Trace pericardial effusion. Central pulmonary arteries are of normal caliber. Mediastinum/Nodes: The visualized thyroid is unremarkable. No pathologic thoracic adenopathy. The esophagus is unremarkable. Small hiatal hernia. Lungs/Pleura: Trace residual left pleural effusion or pleural thickening. Mild parenchymal scarring within the left upper lobe is unchanged. No new  focal pulmonary nodules or infiltrates. No pneumothorax. Central airways are widely patent. Musculoskeletal: Status post left mastectomy. Osseous structures are diffusely osteopenic. Advanced degenerative changes are seen within the right shoulder. Left shoulder arthroplasty has been performed with anterior subluxation of the a humeral head in relation of the glenoid. Multiple healed left rib fractures are noted. No acute bone abnormality. No lytic or blastic bone lesion. Review of the MIP images confirms the above findings. CTA ABDOMEN AND PELVIS FINDINGS VASCULAR Aorta: Normal caliber aorta without aneurysm, dissection, vasculitis or significant stenosis. 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 Celiac axis is widely patent demonstrates normal anatomic configuration. No dissection. SMA: 50-75% stenosis of the origin. Distally widely patent without evidence of aneurysm or dissection. Renals: Both renal arteries are patent without evidence of aneurysm, dissection, vasculitis, fibromuscular dysplasia or significant stenosis. IMA: Patent without evidence of aneurysm, dissection, vasculitis or significant stenosis. Inflow: Patent without evidence of aneurysm, dissection, vasculitis or significant stenosis. Internal iliac arteries are patent bilaterally. Veins: No obvious venous abnormality within the limitations of this arterial phase study. Review of the MIP images confirms the above findings. NON-VASCULAR Hepatobiliary: The gallbladder is distended and there is extensive pericholecystic inflammatory change in keeping with changes of acute cholecystitis. No loculated pericholecystic fluid collections are identified. There has developed marked intra and extrahepatic biliary ductal dilation to the level of the ampulla. While a distal obstructing lesion is not clearly identified, this is not optimally evaluated on this examination. No enhancing intrahepatic mass identified. Pancreas:  Unremarkable Spleen: Unremarkable Adrenals/Urinary Tract: The adrenal glands are unremarkable. The kidneys are normal in position. Mild bilateral renal cortical atrophy, right greater than left. Multiple simple cortical cysts are seen within the kidneys bilaterally. No follow-up imaging is recommended for these lesions. No enhancing intrarenal masses  are seen. No hydronephrosis. No intrarenal or ureteral calculi. The bladder is partially obscured by streak artifact, however, the visualized portion is unremarkable. Stomach/Bowel: Partial small bowel resection has been performed with wide patency of the surgical anastomosis noted within the anterior pelvis. Stomach, small bowel, and large bowel are otherwise unremarkable. Appendix absent. No free intraperitoneal gas or fluid. Lymphatic: No pathologic adenopathy within the abdomen and pelvis. Reproductive: Uterus and bilateral adnexa are unremarkable. Other: There is diastasis of the rectus abdominus musculature with marked thinning of the abdominal wall anteriorly. No frank hernia, however. Rectum unremarkable. Musculoskeletal: Right total hip arthroplasty been performed. Degenerative changes are seen within the lumbar spine and left hip. No acute bone abnormality. No lytic or blastic bone lesion. Review of the MIP images confirms the above findings. IMPRESSION: 1. No evidence of thoracic or abdominal aortic aneurysm or dissection. 2. Extensive calcification of the aortic valve leaflets. Left ventricular hypertrophy. Mild coronary artery calcification. Echocardiography may be helpful to assess the degree of valvular dysfunction. 3. Acute cholecystitis. No loculated pericholecystic fluid collections identified. 4. Interval development of marked intra and extrahepatic biliary ductal dilation to the level of the ampulla. While a distal obstructing lesion is not clearly identified, this is not optimally evaluated on this examination. Correlation with liver enzymes is  recommended. If abnormal, this could be further assessed with ERCP or MRCP examination. 5. 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 6. 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 Wide patency of the celiac and inferior mesenteric artery origins. Aortic Atherosclerosis (ICD10-I70.0). Electronically Signed   By: Fidela Salisbury M.D.   On: 02/06/2022 21:51   DG Chest Portable 1 View  Result Date: 02/06/2022 CLINICAL DATA:  Chest pain and pressure EXAM: PORTABLE CHEST 1 VIEW COMPARISON:  05/05/2021 FINDINGS: Cardiomegaly. No acute airspace opacity. Status post left shoulder arthroplasty. Severe arthrosis of the right shoulder with high riding position of the humeral head consistent with chronic rotator cuff tear. IMPRESSION: Cardiomegaly without acute abnormality of the lungs. Electronically Signed   By: Delanna Ahmadi M.D.   On: 02/06/2022 19:38     Assessment and Plan:   Preoperative evaluation prior to cholecystectomy-patient has a history of moderate to severe aortic stenosis by echocardiogram November 2022.  At that time TAVR work-up was initiated but she ultimately declined.  She has severe aortic stenosis on exam.  Given age and severity of AS I think she would be high risk for any surgical procedure.  She now states she would be agreeable to TAVR if we felt indicated.  We will plan to repeat echocardiogram to reassess.  Best option may be to allow her to recover from recent ERCP/sphincterotomy.  She could then be assessed by the structural heart team for consideration of TAVR.  Once TAVR complete, cholecystectomy could be pursued.  This of course assumes she has no further problems with infection.  Alternatively patient could proceed with cholecystectomy now with the understanding that her risk is high if everyone in agreement. Severe aortic stenosis-plan repeat echocardiogram as outlined above. Elevated troponin-minimal elevation in troponin likely due to stress of cholecystitis.   Previous catheterization in November showed no coronary disease.  No indication for further ischemia evaluation. Hypertension-continue present blood pressure medications and follow. Acute cholecystitis-symptoms are improving status post ERCP/sphincterotomy.  Continue antibiotics.  Management per general surgery.  For questions or updates, please contact [place] Please consult [email or web address] for contact info under    Signed, Cecilie Kicks, NP  02/08/2022 11:14 AM

## 2022-02-08 NOTE — Progress Notes (Signed)
Progress Note    ASSESSMENT AND PLAN:   Ascending cholangitis s/p biliary sphincterotomy/sphincteroplasty/stone extraction and insertion of plastic biliary stent.  No immediate complications. LFTs trending down.  Acute cholecystitis  Multiple comorbidities including critical aortic stenosis, advanced age, prior surgeries and biliary interventions.  Plan: -Advance diet -Trend CBC, LFTs -Continue antibiotics x 5 days (or longer if Sx desires) -Further work-up per surgery/cardiology -[facility] in 4-6 weeks.  Would need to have stent removed in 6-8 weeks by Dr Rush Landmark. -Will sign off for now.     SUBJECTIVE   Doing much better No abdominal pain No fever or chills No melena    OBJECTIVE:     Vital signs in last 24 hours: Temp:  [97.8 F (36.6 C)-98.9 F (37.2 C)] 98.9 F (37.2 C) (08/06 1022) Pulse Rate:  [70-88] 88 (08/06 1022) Resp:  [17-26] 20 (08/06 1022) BP: (132-182)/(48-94) 138/48 (08/06 1022) SpO2:  [91 %-100 %] 91 % (08/06 1022) Weight:  [65.4 kg] 65.4 kg (08/06 0400) Last BM Date : 02/07/22 General:   Alert, NAD EENT:  Normal hearing, non icteric sclera, conjunctive pink.  Abdomen:  Soft, nondistended, nontender.  Normal bowel sounds,.       Psych:  Pleasant, cooperative.  Normal mood and affect.   Intake/Output from previous day: 08/05 0701 - 08/06 0700 In: 1851 [P.O.:480; I.V.:1365.8; IV Piggyback:5.1] Out: 1573 [Urine:1570; Blood:3] Intake/Output this shift: Total I/O In: 1340.9 [P.O.:470; I.V.:812.9; IV Piggyback:58] Out: -   Lab Results: Recent Labs    02/06/22 1926 02/07/22 0549 02/08/22 0347  WBC 11.1* 17.3* 13.4*  HGB 9.0* 8.2* 8.6*  HCT 28.4* 25.2* 26.9*  PLT 188 172 155   BMET Recent Labs    02/06/22 1926 02/07/22 0549 02/08/22 0347  NA 139 139 139  K 4.3 4.0 3.5  CL 106 107 105  CO2 '25 25 25  '$ GLUCOSE 115* 162* 88  BUN 22 24* 20  CREATININE 1.39* 1.54* 1.28*  CALCIUM 9.1 8.4* 8.3*   LFT Recent Labs     02/06/22 1926 02/07/22 0549 02/08/22 0347  PROT 6.3* 5.4* 5.3*  ALBUMIN 3.2* 2.5* 2.5*  AST 36 693* 185*  ALT 22 482* 269*  ALKPHOS 78 149* 119  BILITOT 0.8 2.1* 1.2  BILIDIR 0.3* 1.0*  --   IBILI 0.5  --   --    PT/INR Recent Labs    02/07/22 0549  LABPROT 14.7  INR 1.2   Hepatitis Panel No results for input(s): "HEPBSAG", "HCVAB", "HEPAIGM", "HEPBIGM" in the last 72 hours.  DG ERCP  Result Date: 02/07/2022 CLINICAL DATA:  ERCP EXAM: ERCP TECHNIQUE: Multiple spot images obtained with the fluoroscopic device and submitted for interpretation post-procedure. FLUOROSCOPY: Refer to separate report COMPARISON:  None Available. FINDINGS: A total of 9 fluoroscopic spot images taken during ERCP are submitted for review. Initial images demonstrate scope overlying the abdomen with wire catheterization of the common bile duct. Contrast injection of the common bile duct demonstrates a dilated common bile duct. Balloon sweep(s) of the common bile duct is performed. A plastic biliary stent is left in place and the scope was withdrawn. IMPRESSION: ERCP images as described. Refer to procedure report for full details. These images were submitted for radiologic interpretation only. Please see the procedural report for the amount of contrast and the fluoroscopy time utilized. Electronically Signed   By: Albin Felling M.D.   On: 02/07/2022 14:34   DG CHEST PORT 1 VIEW  Result Date: 02/07/2022 CLINICAL DATA:  Fluid overload. EXAM: PORTABLE CHEST 1 VIEW COMPARISON:  02/06/2022 FINDINGS: 0818 hours. The cardio pericardial silhouette is enlarged. Interstitial markings are diffusely coarsened with chronic features. Minimal atelectasis noted left base. Bones are diffusely demineralized. Left shoulder replacement. Telemetry leads overlie the chest. IMPRESSION: 1. Enlargement of the cardiopericardial silhouette. 2. Chronic interstitial coarsening with left base atelectasis. Electronically Signed   By: Misty Stanley  M.D.   On: 02/07/2022 10:10   CT Angio Chest/Abd/Pel for Dissection W and/or Wo Contrast  Result Date: 02/06/2022 CLINICAL DATA:  Hypertension, chest pain EXAM: CT ANGIOGRAPHY CHEST, ABDOMEN AND PELVIS TECHNIQUE: Non-contrast CT of the chest was initially obtained. Multidetector CT imaging through the chest, abdomen and pelvis was performed using the standard protocol during bolus administration of intravenous contrast. Multiplanar reconstructed images and MIPs were obtained and reviewed to evaluate the vascular anatomy. RADIATION DOSE REDUCTION: This exam was performed according to the departmental dose-optimization program which includes automated exposure control, adjustment of the mA and/or kV according to patient size and/or use of iterative reconstruction technique. CONTRAST:  67m OMNIPAQUE IOHEXOL 350 MG/ML SOLN COMPARISON:  CT chest 01/30/2019, CT abdomen pelvis 11/15/2016 FINDINGS: CTA CHEST FINDINGS Cardiovascular: The thoracic aorta is normal in course and caliber. No intramural hematoma, dissection, or aneurysm. Moderate atherosclerotic calcification. Bovine arch anatomy with wide patency of the arch vasculature proximally. Global cardiac size within normal limits though left ventricular hypertrophy is noted. Extensive calcification of the aortic valve leaflets. Extensive calcification of the mitral valve annulus. Mild coronary artery calcification. Trace pericardial effusion. Central pulmonary arteries are of normal caliber. Mediastinum/Nodes: The visualized thyroid is unremarkable. No pathologic thoracic adenopathy. The esophagus is unremarkable. Small hiatal hernia. Lungs/Pleura: Trace residual left pleural effusion or pleural thickening. Mild parenchymal scarring within the left upper lobe is unchanged. No new focal pulmonary nodules or infiltrates. No pneumothorax. Central airways are widely patent. Musculoskeletal: Status post left mastectomy. Osseous structures are diffusely osteopenic.  Advanced degenerative changes are seen within the right shoulder. Left shoulder arthroplasty has been performed with anterior subluxation of the a humeral head in relation of the glenoid. Multiple healed left rib fractures are noted. No acute bone abnormality. No lytic or blastic bone lesion. Review of the MIP images confirms the above findings. CTA ABDOMEN AND PELVIS FINDINGS VASCULAR Aorta: Normal caliber aorta without aneurysm, dissection, vasculitis or significant stenosis. 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 Celiac axis is widely patent demonstrates normal anatomic configuration. No dissection. SMA: 50-75% stenosis of the origin. Distally widely patent without evidence of aneurysm or dissection. Renals: Both renal arteries are patent without evidence of aneurysm, dissection, vasculitis, fibromuscular dysplasia or significant stenosis. IMA: Patent without evidence of aneurysm, dissection, vasculitis or significant stenosis. Inflow: Patent without evidence of aneurysm, dissection, vasculitis or significant stenosis. Internal iliac arteries are patent bilaterally. Veins: No obvious venous abnormality within the limitations of this arterial phase study. Review of the MIP images confirms the above findings. NON-VASCULAR Hepatobiliary: The gallbladder is distended and there is extensive pericholecystic inflammatory change in keeping with changes of acute cholecystitis. No loculated pericholecystic fluid collections are identified. There has developed marked intra and extrahepatic biliary ductal dilation to the level of the ampulla. While a distal obstructing lesion is not clearly identified, this is not optimally evaluated on this examination. No enhancing intrahepatic mass identified. Pancreas: Unremarkable Spleen: Unremarkable Adrenals/Urinary Tract: The adrenal glands are unremarkable. The kidneys are normal in position. Mild bilateral renal cortical atrophy, right greater than  left. Multiple simple cortical cysts are seen within the kidneys bilaterally. No follow-up imaging is recommended for these lesions. No enhancing intrarenal masses are seen. No hydronephrosis. No intrarenal or ureteral calculi. The bladder is partially obscured by streak artifact, however, the visualized portion is unremarkable. Stomach/Bowel: Partial small bowel resection has been performed with wide patency of the surgical anastomosis noted within the anterior pelvis. Stomach, small bowel, and large bowel are otherwise unremarkable. Appendix absent. No free intraperitoneal gas or fluid. Lymphatic: No pathologic adenopathy within the abdomen and pelvis. Reproductive: Uterus and bilateral adnexa are unremarkable. Other: There is diastasis of the rectus abdominus musculature with marked thinning of the abdominal wall anteriorly. No frank hernia, however. Rectum unremarkable. Musculoskeletal: Right total hip arthroplasty been performed. Degenerative changes are seen within the lumbar spine and left hip. No acute bone abnormality. No lytic or blastic bone lesion. Review of the MIP images confirms the above findings. IMPRESSION: 1. No evidence of thoracic or abdominal aortic aneurysm or dissection. 2. Extensive calcification of the aortic valve leaflets. Left ventricular hypertrophy. Mild coronary artery calcification. Echocardiography may be helpful to assess the degree of valvular dysfunction. 3. Acute cholecystitis. No loculated pericholecystic fluid collections identified. 4. Interval development of marked intra and extrahepatic biliary ductal dilation to the level of the ampulla. While a distal obstructing lesion is not clearly identified, this is not optimally evaluated on this examination. Correlation with liver enzymes is recommended. If abnormal, this could be further assessed with ERCP or MRCP examination. 5. 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 6. 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 Wide patency of the celiac and inferior mesenteric artery origins. Aortic Atherosclerosis (ICD10-I70.0). Electronically Signed   By: Fidela Salisbury M.D.   On: 02/06/2022 21:51   DG Chest Portable 1 View  Result Date: 02/06/2022 CLINICAL DATA:  Chest pain and pressure EXAM: PORTABLE CHEST 1 VIEW COMPARISON:  05/05/2021 FINDINGS: Cardiomegaly. No acute airspace opacity. Status post left shoulder arthroplasty. Severe arthrosis of the right shoulder with high riding position of the humeral head consistent with chronic rotator cuff tear. IMPRESSION: Cardiomegaly without acute abnormality of the lungs. Electronically Signed   By: Delanna Ahmadi M.D.   On: 02/06/2022 19:38     Principal Problem:   Acute cholecystitis Active Problems:   Essential hypertension   Choledocholithiasis   Sepsis ([place])   History of breast cancer   History of partial gastrectomy     LOS: 1 day     Brandy Austria, MD 02/08/2022, 2:21 PM [facility] [phone]

## 2022-02-09 DIAGNOSIS — I728 Aneurysm of other specified arteries: Secondary | ICD-10-CM | POA: Diagnosis not present

## 2022-02-09 DIAGNOSIS — A419 Sepsis, unspecified organism: Secondary | ICD-10-CM

## 2022-02-09 DIAGNOSIS — N179 Acute kidney failure, unspecified: Secondary | ICD-10-CM

## 2022-02-09 DIAGNOSIS — K551 Chronic vascular disorders of intestine: Secondary | ICD-10-CM

## 2022-02-09 DIAGNOSIS — I350 Nonrheumatic aortic (valve) stenosis: Secondary | ICD-10-CM | POA: Diagnosis not present

## 2022-02-09 DIAGNOSIS — K8050 Calculus of bile duct without cholangitis or cholecystitis without obstruction: Secondary | ICD-10-CM

## 2022-02-09 DIAGNOSIS — I10 Essential (primary) hypertension: Secondary | ICD-10-CM

## 2022-02-09 DIAGNOSIS — K8031 Calculus of bile duct with cholangitis, unspecified, with obstruction: Secondary | ICD-10-CM

## 2022-02-09 LAB — COMPREHENSIVE METABOLIC PANEL
ALT: 171 U/L — ABNORMAL HIGH (ref 0–44)
AST: 70 U/L — ABNORMAL HIGH (ref 15–41)
Albumin: 2.6 g/dL — ABNORMAL LOW (ref 3.5–5.0)
Alkaline Phosphatase: 99 U/L (ref 38–126)
Anion gap: 8 (ref 5–15)
BUN: 17 mg/dL (ref 8–23)
CO2: 26 mmol/L (ref 22–32)
Calcium: 8.6 mg/dL — ABNORMAL LOW (ref 8.9–10.3)
Chloride: 106 mmol/L (ref 98–111)
Creatinine, Ser: 1.14 mg/dL — ABNORMAL HIGH (ref 0.44–1.00)
GFR, Estimated: 47 mL/min — ABNORMAL LOW (ref >60)
Glucose, Bld: 103 mg/dL — ABNORMAL HIGH (ref 70–99)
Potassium: 3.6 mmol/L (ref 3.5–5.1)
Sodium: 140 mmol/L (ref 135–145)
Total Bilirubin: 0.8 mg/dL (ref 0.3–1.2)
Total Protein: 5.7 g/dL — ABNORMAL LOW (ref 6.5–8.1)

## 2022-02-09 LAB — GLUCOSE, CAPILLARY
Glucose-Capillary: 96 mg/dL (ref 70–99)
Glucose-Capillary: 125 mg/dL — ABNORMAL HIGH (ref 70–99)

## 2022-02-09 MED ORDER — AMOXICILLIN-POT CLAVULANATE 875-125 MG PO TABS: 1.0000 | ORAL_TABLET | Freq: Two times a day (BID) | ORAL | 0 refills | Status: AC

## 2022-02-09 MED ORDER — ADULT MULTIVITAMIN W/MINERALS CH
1.0000 | ORAL_TABLET | Freq: Every day | ORAL | Status: DC
Start: 2022-02-09 — End: 2022-02-09
  Administered 2022-02-09: 1 via ORAL
  Filled 2022-02-09: qty 1

## 2022-02-09 MED ORDER — ENSURE ENLIVE PO LIQD
237.0000 mL | Freq: Two times a day (BID) | ORAL | Status: DC
Start: 2022-02-09 — End: 2022-02-09
  Administered 2022-02-09: 237 mL via ORAL

## 2022-02-09 NOTE — Progress Notes (Signed)
[facility] Progress Note  2 Days Post-Op  Subjective: CC:   Denies abd pain, fever, chills. She states that she is open to surgery for both her heart valve and her gallbladder "if she needs it". Denies nausea or vomiting.  Objective: Vital signs in last 24 hours: Temp:  [98.1 F (36.7 C)-99.2 F (37.3 C)] 98.7 F (37.1 C) (08/07 0800) Pulse Rate:  [86-98] 86 (08/07 0652) Resp:  [17-20] 17 (08/07 0800) BP: (130-148)/(48-79) 130/63 (08/07 0800) SpO2:  [90 %-93 %] 90 % (08/07 0652) Weight:  [64.8 kg] 64.8 kg (08/07 0125) Last BM Date : 02/07/22  Intake/Output from previous day: 08/06 0701 - 08/07 0700 In: 1635.3 [P.O.:710; I.V.:812.9; IV Piggyback:112.4] Out: 1500 [Urine:1500] Intake/Output this shift: No intake/output data recorded.  PE: Gen:  Alert, NAD, pleasant Card:  Regular rate and rhythm Pulm:  Normal effort Abd: Soft, non-tender, non-distended, no peritonitis, significant scarring of upper midline as previously described. Ventral hernia soft. Skin: warm and dry, no rashes  Psych: A&Ox3   Lab Results:  Recent Labs    02/07/22 0549 02/08/22 0347  WBC 17.3* 13.4*  HGB 8.2* 8.6*  HCT 25.2* 26.9*  PLT 172 155   BMET Recent Labs    02/08/22 0347 02/09/22 0433  NA 139 140  K 3.5 3.6  CL 105 106  CO2 25 26  GLUCOSE 88 103*  BUN 20 17  CREATININE 1.28* 1.14*  CALCIUM 8.3* 8.6*   PT/INR Recent Labs    02/07/22 0549  LABPROT 14.7  INR 1.2   CMP     Component Value Date/Time   NA 140 02/09/2022 0433   NA 139 02/23/2017 1334   NA 137 01/14/2017 1403   K 3.6 02/09/2022 0433   K 4.3 01/14/2017 1403   CL 106 02/09/2022 0433   CL 108 (H) 12/28/2012 1343   CO2 26 02/09/2022 0433   CO2 22 01/14/2017 1403   GLUCOSE 103 (H) 02/09/2022 0433   GLUCOSE 109 01/14/2017 1403   GLUCOSE 99 12/28/2012 1343   BUN 17 02/09/2022 0433   BUN 20 02/23/2017 1334   BUN 32.1 (H) 01/14/2017 1403   CREATININE 1.14 (H) 02/09/2022 0433   CREATININE 1.1  01/14/2017 1403   CALCIUM 8.6 (L) 02/09/2022 0433   CALCIUM 10.0 01/14/2017 1403   PROT 5.7 (L) 02/09/2022 0433   PROT 6.7 01/14/2017 1403   ALBUMIN 2.6 (L) 02/09/2022 0433   ALBUMIN 3.0 (L) 01/14/2017 1403   AST 70 (H) 02/09/2022 0433   AST 8 01/14/2017 1403   ALT 171 (H) 02/09/2022 0433   ALT 8 01/14/2017 1403   ALKPHOS 99 02/09/2022 0433   ALKPHOS 84 01/14/2017 1403   BILITOT 0.8 02/09/2022 0433   BILITOT 0.23 01/14/2017 1403   GFRNONAA 47 (L) 02/09/2022 0433   GFRAA >60 11/16/2018 0604   Lipase     Component Value Date/Time   LIPASE 25 02/06/2022 1926       Studies/Results: ECHOCARDIOGRAM COMPLETE  Result Date: 02/08/2022    ECHOCARDIOGRAM REPORT   Patient Name:   Brandy [name] Date of Exam: 02/08/2022 Medical Rec #:  [id number]       Height:       59.0 in Accession #:    [id number]      Weight:       144.2 lb Date of Birth:  1933-04-18        BSA:          1.605 m Patient Age:  88 years        BP:           138/48 mmHg Patient Gender: F               HR:           91 bpm. Exam Location:  Inpatient Procedure: 2D Echo, Color Doppler and Cardiac Doppler Indications:    Chest Pain  History:        Patient has prior history of Echocardiogram examinations, most                 recent 05/06/2021. Risk Factors:Hypertension and Dyslipidemia.  Sonographer:    Jefferey Pica Referring Phys: [id number] [place]  1. Normal LV function; calcified aortic valve with moderate to severe AS (mean gradient 31 mmHg; DI 0.24); severe MAC with moderate MS (mean gradient 9 mmHg).  2. Left ventricular ejection fraction, by estimation, is 60 to 65%. The left ventricle has normal function. The left ventricle has no regional wall motion abnormalities. There is moderate left ventricular hypertrophy. Left ventricular diastolic function  could not be evaluated.  3. Right ventricular systolic function is normal. The right ventricular size is normal. Tricuspid regurgitation signal is inadequate  for assessing PA pressure.  4. Left atrial size was severely dilated.  5. The mitral valve is normal in structure. Mild mitral valve regurgitation. Moderate mitral stenosis. Severe mitral annular calcification.  6. The aortic valve is calcified. Aortic valve regurgitation is not visualized. Moderate to severe aortic valve stenosis.  7. The inferior vena cava is normal in size with greater than 50% respiratory variability, suggesting right atrial pressure of 3 mmHg. FINDINGS  Left Ventricle: Left ventricular ejection fraction, by estimation, is 60 to 65%. The left ventricle has normal function. The left ventricle has no regional wall motion abnormalities. The left ventricular internal cavity size was normal in size. There is  moderate left ventricular hypertrophy. Left ventricular diastolic function could not be evaluated due to mitral annular calcification (moderate or greater). Left ventricular diastolic function could not be evaluated. Right Ventricle: The right ventricular size is normal. Right ventricular systolic function is normal. Tricuspid regurgitation signal is inadequate for assessing PA pressure. The tricuspid regurgitant velocity is 2.29 m/s, and with an assumed right atrial  pressure of 5 mmHg, the estimated right ventricular systolic pressure is 93.7 mmHg. Left Atrium: Left atrial size was severely dilated. Right Atrium: Right atrial size was normal in size. Pericardium: There is no evidence of pericardial effusion. Mitral Valve: The mitral valve is normal in structure. Severe mitral annular calcification. Mild mitral valve regurgitation. Moderate mitral valve stenosis. MV peak gradient, 18.2 mmHg. The mean mitral valve gradient is 9.0 mmHg. Tricuspid Valve: The tricuspid valve is normal in structure. Tricuspid valve regurgitation is mild . No evidence of tricuspid stenosis. Aortic Valve: The aortic valve is calcified. Aortic valve regurgitation is not visualized. Moderate to severe aortic stenosis is  present. Aortic valve mean gradient measures 31.0 mmHg. Aortic valve peak gradient measures 53.9 mmHg. Pulmonic Valve: The pulmonic valve was normal in structure. Pulmonic valve regurgitation is trivial. No evidence of pulmonic stenosis. Aorta: The aortic root is normal in size and structure. Venous: The inferior vena cava is normal in size with greater than 50% respiratory variability, suggesting right atrial pressure of 3 mmHg. IAS/Shunts: No atrial level shunt detected by color flow Doppler. Additional Comments: Normal LV function; calcified aortic valve with moderate to severe AS (mean gradient 31 mmHg; DI  0.24); severe MAC with moderate MS (mean gradient 9 mmHg).  LEFT VENTRICLE PLAX 2D LVIDd:         4.60 cm Diastology LVIDs:         3.10 cm LV e' lateral: 5.40 cm/s LV PW:         1.40 cm LV IVS:        1.30 cm  RIGHT VENTRICLE             IVC RV Basal diam:  3.10 cm     IVC diam: 1.90 cm RV S prime:     14.30 cm/s TAPSE (M-mode): 1.9 cm LEFT ATRIUM             Index        RIGHT ATRIUM           Index LA diam:        5.00 cm 3.12 cm/m   RA Area:     16.90 cm LA Vol (A2C):   80.2 ml 49.97 ml/m  RA Volume:   42.10 ml  26.23 ml/m LA Vol (A4C):   69.0 ml 42.99 ml/m LA Biplane Vol: 80.0 ml 49.85 ml/m  AORTIC VALVE                    PULMONIC VALVE AV Vmax:           367.00 cm/s  PV Vmax:       1.06 m/s AV Vmean:          261.000 cm/s PV Peak grad:  4.5 mmHg AV VTI:            0.841 m AV Peak Grad:      53.9 mmHg AV Mean Grad:      31.0 mmHg LVOT Vmax:         95.80 cm/s LVOT Vmean:        55.500 cm/s LVOT VTI:          0.206 m LVOT/AV VTI ratio: 0.24  AORTA Ao Root diam: 3.60 cm Ao Asc diam:  3.10 cm MITRAL VALVE             TRICUSPID VALVE MV Peak grad: 18.2 mmHg  TR Peak grad:   21.0 mmHg MV Mean grad: 9.0 mmHg   TR Vmax:        229.00 cm/s MV Vmax:      2.13 m/s MV Vmean:     142.3 cm/s SHUNTS                          Systemic VTI: 0.21 m Kirk Ruths MD Electronically signed by Kirk Ruths MD  Signature Date/Time: 02/08/2022/3:10:01 PM    Final    DG ERCP  Result Date: 02/07/2022 CLINICAL DATA:  ERCP EXAM: ERCP TECHNIQUE: Multiple spot images obtained with the fluoroscopic device and submitted for interpretation post-procedure. FLUOROSCOPY: Refer to separate report COMPARISON:  None Available. FINDINGS: A total of 9 fluoroscopic spot images taken during ERCP are submitted for review. Initial images demonstrate scope overlying the abdomen with wire catheterization of the common bile duct. Contrast injection of the common bile duct demonstrates a dilated common bile duct. Balloon sweep(s) of the common bile duct is performed. A plastic biliary stent is left in place and the scope was withdrawn. IMPRESSION: ERCP images as described. Refer to procedure report for full details. These images were submitted for radiologic interpretation only. Please see the procedural report for the  amount of contrast and the fluoroscopy time utilized. Electronically Signed   By: Albin Felling M.D.   On: 02/07/2022 14:34    Anti-infectives: Anti-infectives (From admission, onward)    Start     Dose/Rate Route Frequency Ordered Stop   02/07/22 0600  piperacillin-tazobactam (ZOSYN) IVPB 3.375 g  Status:  Discontinued        3.375 g 100 mL/hr over 30 Minutes Intravenous Every 6 hours 02/07/22 0509 02/07/22 0512   02/07/22 0600  piperacillin-tazobactam (ZOSYN) IVPB 3.375 g        3.375 g 12.5 mL/hr over 240 Minutes Intravenous Every 8 hours 02/07/22 0512 02/12/22 0559   02/06/22 2215  piperacillin-tazobactam (ZOSYN) IVPB 3.375 g        3.375 g 100 mL/hr over 30 Minutes Intravenous  Once 02/06/22 2208 02/06/22 2332        Assessment/Plan Recurrent choledocholithiasis causing ascending cholangitis  -  PMH choledocholithiasis and benign ampullary stricture who presented 8/4 with epigastric pain, nausea, and vomiting.   - developed fever, leukocytosis, elevated LFTs over 24 hours >> taken yesterday for ERCP  sphincterotomy, sphincteroplasty, balloon extraction of choledocho and biliary plastic stenting.  - she is clinically improving s/p above procedure for ascending cholangitis and does not have signs of cholecystitis right now. WBC and LFT's down-trending. Tolerating soft diet. Appreciate cardiology consult. She is high risk for any operative procedure given severe AS. Cardiology plans to see her outpatient for consideration of TAVR after she is farther out from her cholangitis. Since there is no emergent indication for cholecystectomy today, I think discharge home with outpatient cardiology and GI follow up (for stent removal) is most reasonable. No plans for lap chole this admission.    LOS: 2 days   I reviewed Consult[facility] notes, hospitalist notes, last 24 h vitals and pain scores, last 48 h intake and output, last 24 h labs and trends, and last 24 h imaging results.    Obie Dredge, PA-C [facility] Please see Amion for pager number during day hours 7:00am-4:30pm

## 2022-02-09 NOTE — Discharge Instructions (Signed)
Please take a low fat diet to prevent a gallbladder attack.   Please review all of you discharge paperwork on the day of discharge and be sure you have all of your prescribed medications.  Please request your Primary MD to go over all Hospital Tests and Procedure/Radiological results at the follow up Please get all Hospital records sent to your primary MD by signing hospital release before you go home.   In some cases, there will be blood work, cultures and biopsy results pending at the time of your discharge. Please request that your primary care M.D. goes through all the records of your hospital data and follows up on these results.  Please take all your medications with you for your next visit with your Primary MD   Please request your Primary MD to go over all hospital tests and procedure/radiological results at the follow up, please ask your Primary MD to get all Hospital records sent to his/her office.   You must read complete instructions/literature along with all the possible adverse reactions/side effects for all the Medicines you take and that have been prescribed to you. Take any new Medicines after you have completely understood and accpet all the possible adverse reactions/side effects.    Do not drive or operate heavy machinery when taking Pain medications.    Do not take more than prescribed Pain, Sleep and Anxiety Medications  If you have smoked or chewed Tobacco  in the last 2 yrs please stop smoking, stop any regular Alcohol  and or any Recreational drug use.   Wear Seat belts while driving.   If you had Pneumonia or Lung problems at the Hospital: Please get a 2 view Chest X ray done in 6-8 weeks after hospital discharge or sooner if instructed by your Primary MD.   If you have Congestive Heart Failure: Please call your Cardiologist or Primary MD anytime you have any of the following symptoms:  1) 3 pound weight gain in 24 hours or 5 pounds in 1 week  2) shortness of  breath, with or without a dry hacking cough  3) swelling in the hands, feet or stomach  4) if you have to sleep on extra pillows at night in order to breathe 5) Follow cardiac low salt diet and 1.5 lit/day fluid restriction.   If you have Diabetes Accuchecks 4 times/day- once on AM empty stomach and then before each meal. Log in all results and show them to your primary doctor at your next visit. If any glucose reading is under 60 or above 400 call your primary MD immediately.   If you have Seizure/Convulsions/Epilepsy: Please do not drive, operate heavy machinery, participate in activities at heights or participate in high speed sports until you have seen by Primary MD or a Neurologist and advised to do so again. Per [facility] statutes, patients with seizures are not allowed to drive until they have been seizure-free for six months.  Use caution when using heavy equipment or power tools. Avoid working on ladders or at heights. Take showers instead of baths. Ensure the water temperature is not too high on the home water heater. Do not go swimming alone. Do not lock yourself in a room alone (i.e. bathroom). When caring for infants or small children, sit down when holding, feeding, or changing them to minimize risk of injury to the child in the event you have a seizure. Maintain good sleep hygiene. Avoid alcohol.    If you had Gastrointestinal Bleeding: Please  ask your Primary MD to check a complete blood count within one week of discharge or at your next visit. Your endoscopic/colonoscopic biopsies that are pending at the time of discharge, will also need to followed by your Primary MD.  Please note You were cared for by a hospitalist during your hospital stay. If you have any questions about your discharge medications or the care you received while you were in the hospital after you are discharged, you can call the unit and asked to speak with the hospitalist on call if the hospitalist  that took care of you is not available. Once you are discharged, your primary care physician will handle any further medical issues. Please note that NO REFILLS for any discharge medications will be authorized once you are discharged, as it is imperative that you return to your primary care physician (or establish a relationship with a primary care physician if you do not have one) for your aftercare needs so that they can reassess your need for medications and monitor your lab values.   You can reach the hospitalist office at phone [phone] or fax [phone]   If you do not have a primary care physician, you can call [phone] for a physician referral.

## 2022-02-09 NOTE — Progress Notes (Signed)
Progress Note  Patient Name: Brandy [name] Date of Encounter: 02/09/2022  [facility] Cardiologist: Donato Heinz, MD   Subjective   No CP or dyspnea; abdominal pain improved  Inpatient Medications    Scheduled Meds:  DULoxetine  20 mg Oral BID   ferrous sulfate  325 mg Oral Daily   gabapentin  600 mg Oral QHS   pantoprazole  40 mg Oral Daily   rOPINIRole  0.25 mg Oral QHS   Continuous Infusions:  piperacillin-tazobactam (ZOSYN)  IV 3.375 g (02/09/22 0637)   PRN Meds: acetaminophen **OR** acetaminophen, albuterol, metoprolol tartrate, ondansetron **OR** ondansetron (ZOFRAN) IV, oxyCODONE   Vital Signs    Vitals:   02/08/22 1538 02/08/22 1949 02/09/22 0125 02/09/22 0652  BP: (!) 148/62 (!) 148/79 (!) 146/67 (!) 144/71  Pulse: 88 91 98 86  Resp: _0 Temp: 99 F (37.2 C) 99.2 F (37.3 C) 98.8 F (37.1 C) 98.1 F (36.7 C)  TempSrc: Oral Oral Oral Oral  SpO2: 91% 93% 91% 90%  Weight:   64.8 kg   Height:        Intake/Output Summary (Last 24 hours) at 02/09/2022 0737 Last data filed at 02/09/2022 0128 Gross per 24 hour  Intake 1635.33 ml  Output 1500 ml  Net 135.33 ml      02/09/2022    1:25 AM 02/08/2022    4:00 AM 02/07/2022    2:00 AM  Last 3 Weights  Weight (lbs) 142 lb 13.7 oz 144 lb 2.9 oz 141 lb 15.6 oz  Weight (kg) 64.8 kg 65.4 kg 64.4 kg      Telemetry    Sinus - Personally Reviewed  Physical Exam   GEN: No acute distress.   Neck: No JVD Cardiac: RRR, 3/6 systolic murmur Respiratory: Clear to auscultation bilaterally. GI: Soft, mild RUQ tenderness MS: No edema Neuro:  Nonfocal  Psych: Normal affect   Labs    High Sensitivity Troponin:   Recent Labs  Lab 02/06/22 1926 02/06/22 2100 02/07/22 0549 02/07/22 0737  TROPONINIHS 36* 41* 101* 98*     Chemistry Recent Labs  Lab 02/07/22 0549 02/08/22 0347 02/09/22 0433  NA 139 139 140  K 4.0 3.5 3.6  CL 107 105 106  CO2 _1 GLUCOSE 162* 88 103*  BUN 24* 20  17  CREATININE 1.54* 1.28* 1.14*  CALCIUM 8.4* 8.3* 8.6*  MG 1.7  --   --   PROT 5.4* 5.3* 5.7*  ALBUMIN 2.5* 2.5* 2.6*  AST 693* 185* 70*  ALT 482* 269* 171*  ALKPHOS 149* 119 99  BILITOT 2.1* 1.2 0.8  GFRNONAA 33* 41* 47*  ANIONGAP _2 Hematology Recent Labs  Lab 02/06/22 1926 02/07/22 0549 02/08/22 0347  WBC 11.1* 17.3* 13.4*  RBC 3.05* 2.74* 2.92*  HGB 9.0* 8.2* 8.6*  HCT 28.4* 25.2* 26.9*  MCV 93.1 92.0 92.1  MCH 29.5 29.9 29.5  MCHC 31.7 32.5 32.0  RDW 14.6 15.2 15.2  PLT 188 172 155    BNP Recent Labs  Lab 02/06/22 1926 02/07/22 0549  BNP 167.1* 650.5*      Radiology    ECHOCARDIOGRAM COMPLETE  Result Date: 02/08/2022    ECHOCARDIOGRAM REPORT   Patient Name:   Brandy [name] Date of Exam: 02/08/2022 Medical Rec #:  [id number]       Height:       59.0 in Accession #:    [id number]  Weight:       144.2 lb Date of Birth:  12-13-1932        BSA:          1.605 m Patient Age:    88 years        BP:           138/48 mmHg Patient Gender: F               HR:           91 bpm. Exam Location:  Inpatient Procedure: 2D Echo, Color Doppler and Cardiac Doppler Indications:    Chest Pain  History:        Patient has prior history of Echocardiogram examinations, most                 recent 05/06/2021. Risk Factors:Hypertension and Dyslipidemia.  Sonographer:    Jefferey Pica Referring Phys: [id number] [place]  1. Normal LV function; calcified aortic valve with moderate to severe AS (mean gradient 31 mmHg; DI 0.24); severe MAC with moderate MS (mean gradient 9 mmHg).  2. Left ventricular ejection fraction, by estimation, is 60 to 65%. The left ventricle has normal function. The left ventricle has no regional wall motion abnormalities. There is moderate left ventricular hypertrophy. Left ventricular diastolic function  could not be evaluated.  3. Right ventricular systolic function is normal. The right ventricular size is normal. Tricuspid regurgitation  signal is inadequate for assessing PA pressure.  4. Left atrial size was severely dilated.  5. The mitral valve is normal in structure. Mild mitral valve regurgitation. Moderate mitral stenosis. Severe mitral annular calcification.  6. The aortic valve is calcified. Aortic valve regurgitation is not visualized. Moderate to severe aortic valve stenosis.  7. The inferior vena cava is normal in size with greater than 50% respiratory variability, suggesting right atrial pressure of 3 mmHg. FINDINGS  Left Ventricle: Left ventricular ejection fraction, by estimation, is 60 to 65%. The left ventricle has normal function. The left ventricle has no regional wall motion abnormalities. The left ventricular internal cavity size was normal in size. There is  moderate left ventricular hypertrophy. Left ventricular diastolic function could not be evaluated due to mitral annular calcification (moderate or greater). Left ventricular diastolic function could not be evaluated. Right Ventricle: The right ventricular size is normal. Right ventricular systolic function is normal. Tricuspid regurgitation signal is inadequate for assessing PA pressure. The tricuspid regurgitant velocity is 2.29 m/s, and with an assumed right atrial  pressure of 5 mmHg, the estimated right ventricular systolic pressure is 50.9 mmHg. Left Atrium: Left atrial size was severely dilated. Right Atrium: Right atrial size was normal in size. Pericardium: There is no evidence of pericardial effusion. Mitral Valve: The mitral valve is normal in structure. Severe mitral annular calcification. Mild mitral valve regurgitation. Moderate mitral valve stenosis. MV peak gradient, 18.2 mmHg. The mean mitral valve gradient is 9.0 mmHg. Tricuspid Valve: The tricuspid valve is normal in structure. Tricuspid valve regurgitation is mild . No evidence of tricuspid stenosis. Aortic Valve: The aortic valve is calcified. Aortic valve regurgitation is not visualized. Moderate to  severe aortic stenosis is present. Aortic valve mean gradient measures 31.0 mmHg. Aortic valve peak gradient measures 53.9 mmHg. Pulmonic Valve: The pulmonic valve was normal in structure. Pulmonic valve regurgitation is trivial. No evidence of pulmonic stenosis. Aorta: The aortic root is normal in size and structure. Venous: The inferior vena cava is normal in size with greater than  50% respiratory variability, suggesting right atrial pressure of 3 mmHg. IAS/Shunts: No atrial level shunt detected by color flow Doppler. Additional Comments: Normal LV function; calcified aortic valve with moderate to severe AS (mean gradient 31 mmHg; DI 0.24); severe MAC with moderate MS (mean gradient 9 mmHg).  LEFT VENTRICLE PLAX 2D LVIDd:         4.60 cm Diastology LVIDs:         3.10 cm LV e' lateral: 5.40 cm/s LV PW:         1.40 cm LV IVS:        1.30 cm  RIGHT VENTRICLE             IVC RV Basal diam:  3.10 cm     IVC diam: 1.90 cm RV S prime:     14.30 cm/s TAPSE (M-mode): 1.9 cm LEFT ATRIUM             Index        RIGHT ATRIUM           Index LA diam:        5.00 cm 3.12 cm/m   RA Area:     16.90 cm LA Vol (A2C):   80.2 ml 49.97 ml/m  RA Volume:   42.10 ml  26.23 ml/m LA Vol (A4C):   69.0 ml 42.99 ml/m LA Biplane Vol: 80.0 ml 49.85 ml/m  AORTIC VALVE                    PULMONIC VALVE AV Vmax:           367.00 cm/s  PV Vmax:       1.06 m/s AV Vmean:          261.000 cm/s PV Peak grad:  4.5 mmHg AV VTI:            0.841 m AV Peak Grad:      53.9 mmHg AV Mean Grad:      31.0 mmHg LVOT Vmax:         95.80 cm/s LVOT Vmean:        55.500 cm/s LVOT VTI:          0.206 m LVOT/AV VTI ratio: 0.24  AORTA Ao Root diam: 3.60 cm Ao Asc diam:  3.10 cm MITRAL VALVE             TRICUSPID VALVE MV Peak grad: 18.2 mmHg  TR Peak grad:   21.0 mmHg MV Mean grad: 9.0 mmHg   TR Vmax:        229.00 cm/s MV Vmax:      2.13 m/s MV Vmean:     142.3 cm/s SHUNTS                          Systemic VTI: 0.21 m Kirk Ruths MD Electronically signed  by Kirk Ruths MD Signature Date/Time: 02/08/2022/3:10:01 PM    Final    DG ERCP  Result Date: 02/07/2022 CLINICAL DATA:  ERCP EXAM: ERCP TECHNIQUE: Multiple spot images obtained with the fluoroscopic device and submitted for interpretation post-procedure. FLUOROSCOPY: Refer to separate report COMPARISON:  None Available. FINDINGS: A total of 9 fluoroscopic spot images taken during ERCP are submitted for review. Initial images demonstrate scope overlying the abdomen with wire catheterization of the common bile duct. Contrast injection of the common bile duct demonstrates a dilated common bile duct. Balloon sweep(s) of the common bile duct is performed. A plastic  biliary stent is left in place and the scope was withdrawn. IMPRESSION: ERCP images as described. Refer to procedure report for full details. These images were submitted for radiologic interpretation only. Please see the procedural report for the amount of contrast and the fluoroscopy time utilized. Electronically Signed   By: Albin Felling M.D.   On: 02/07/2022 14:34   DG CHEST PORT 1 VIEW  Result Date: 02/07/2022 CLINICAL DATA:  Fluid overload. EXAM: PORTABLE CHEST 1 VIEW COMPARISON:  02/06/2022 FINDINGS: 0818 hours. The cardio pericardial silhouette is enlarged. Interstitial markings are diffusely coarsened with chronic features. Minimal atelectasis noted left base. Bones are diffusely demineralized. Left shoulder replacement. Telemetry leads overlie the chest. IMPRESSION: 1. Enlargement of the cardiopericardial silhouette. 2. Chronic interstitial coarsening with left base atelectasis. Electronically Signed   By: Misty Stanley M.D.   On: 02/07/2022 10:10     Patient Profile     88 y.o. female with past medical history of aortic stenosis, hypertension, breast cancer admitted with cholecystitis for preoperative evaluation prior to cholecystectomy.  Patient had previously been evaluated for TAVR but she declined.  Echocardiogram this admission  shows normal LV function, calcified aortic valve with mean gradient 31 mmHg and dimensionless index 0.24 suggestive of moderate to severe aortic stenosis, severe mitral annular calcification with moderate mitral stenosis and mean gradient 9 mmHg; mild mitral regurgitation, severe left atrial enlargement.  Assessment & Plan    Preoperative evaluation prior to cholecystectomy-echocardiogram this admission shows moderate to severe aortic stenosis.  Note mean gradient on previous echocardiogram November 2022 was 38 mmHg.  She was considered for TAVR at that time but she ultimately declined.  Given her age, comorbidities and aortic stenosis I think she would be high risk for cholecystectomy.  She is now agreeable to considering TAVR.  I think best option would be to allow her to recover from recent cholecystitis.  She can then follow-up in the structural heart clinic for consideration of TAVR.  Once that is complete cholecystectomy could be considered after that.  Would not pursue TAVR at this point given recent ascending cholangitis/cholecystitis and risk of valve infection.  As outlined previously cholecystectomy could be considered if necessary without TAVR with the understanding that she would be high risk for any procedure. Severe aortic stenosis-plan TAVR work-up as an outpatient as outlined above. Elevated troponin-minimal elevation in troponin likely due to stress of cholecystitis.  Previous catheterization in November showed no coronary disease.  No indication for further ischemia evaluation. Hypertension-continue present blood pressure medications and follow. Acute cholecystitis-symptoms are improving status post ERCP/sphincterotomy.  Continue antibiotics.  Management per general surgery.  We will arrange follow-up in structural heart clinic 2 to 4 weeks following discharge.  We will also arrange follow-up with Dr. Gardiner Rhyme in 6 to 8 weeks.  Resume preadmission medications at discharge. Cardiology  will sign off.  Please call with questions.  For questions or updates, please contact [place] Please consult [email or web address] for contact info under        Signed, Kirk Ruths, MD  02/09/2022, 7:37 AM

## 2022-02-09 NOTE — TOC Transition Note (Signed)
Transition of Care [facility]) - CM/SW Discharge Note   Patient Details  Name: AURIANA SCALIA MRN: [id number] Date of Birth: 21-Jan-1933  [facility] CM/SW Contact:  Zenon Mayo, RN Phone Number: 02/09/2022, 3:11 PM   Clinical Narrative:    Patient is from home alone, has daughter , [name] is her support system , she takes her to her MD apts.  Patient has a walker and two w/chairs at home.  She has PCP apt on AVS.  [name] will transport her home today.  Patient is active with [facility] for HHPT, HHAIDE and [employer], museum , she would like to continue with these services.  Anderson Malta with [facility] confirmed.  [name] will transport her home today. NCM asked MD for [facility] orders.    Final next level of care: Home/Self Care Barriers to Discharge: No Barriers Identified   Patient Goals and CMS Choice Patient states their goals for this hospitalization and ongoing recovery are:: return home   Choice offered to / list presented to : NA  Discharge Placement                       Discharge Plan and Services In-house Referral: NA Discharge Planning Services: CM Consult Post Acute Care Choice: NA            DME Agency: NA       HH Arranged: NA          Social Determinants of Health (SDOH) Interventions     Readmission Risk Interventions    02/09/2022    3:00 PM  Readmission Risk Prevention Plan  Transportation Screening Complete  PCP or Specialist Appt within 5-7 Days Complete  Home Care Screening Complete  Medication Review (RN CM) Complete

## 2022-02-09 NOTE — Progress Notes (Signed)
Mobility Specialist - Progress Note   02/09/22 1000  Mobility  Activity Ambulated with assistance in hallway  Level of Assistance Contact guard assist, steadying assist  Assistive Device Front wheel walker  Distance Ambulated (ft) 48 ft  Activity Response Tolerated well  $Mobility charge 1 Mobility   Pt received in chair and agreeable to mobility.No c/o pain throughout ambulation. Pt returned to chair with all needs met and Rn present.  Larey Seat

## 2022-02-09 NOTE — Discharge Summary (Addendum)
Physician Discharge Summary  Brandy [name] WVP:[id number] DOB: Dec 24, 1932 DOA: 02/06/2022  PCP: Kathyrn Lass, MD  Admit date: 02/06/2022 Discharge date: 02/09/2022 Discharging to: home Recommendations for Outpatient Follow-up:  She will need to f/u with both cardiology and structural CHF team and then with Genral surgery  Consults:  GI General surgery Cardiology Procedures:  ERCP   Discharge Diagnoses:   Principal Problem:   Cholangitis due to bile duct calculus with obstruction Active Problems:   AKI (acute kidney injury) ([place])   Essential hypertension   Aortic valve stenosis   Choledocholithiasis   Sepsis ([place])   History of breast cancer   History of partial gastrectomy   Splenic artery aneurysm - 7 mm   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  The patient also had shortness of breath. Pox was noted to drop to 87% on room air. Imaging was suggestive of cholecystitis and severely dilated bile ducts.  CTA of chest and abdomen revealed: Distended gallbladder with pericholecystic inflammatory change consistent with acute cholecystitis Marked intra and extrahepatic bile duct dilatation to the level of the ampulla - full report below   GI and general surgery were consulted.  The patient was started on Zosyn, made n.p.o. and given IV fluids.  Principal Problem: Choledocholithiasis, cholangitis with sepsis - The patient was tachycardic, tachypneic, febrile to 100.6 degrees and had leukocytosis - given Zosyn   - 8/5>   ERCP with sphincterotomy and removal of gallstones from the bile duct - advanced to solids and tolerating them - General surgery asking for further assessment and management of  aortic stenosis prior to proceeding with a lap chole - Cardiology recommending further assessment as outpt - will change to Augmentin for 5 more days to cover for cholangitis- I have spoken with general surgery today and they do not feel she had acute cholecystitis   Active Problems:   AKI- possibly CKD 3a - Prior creatinine from 11/22 was 0.69 - Creatinine when admitted was 1.39 and has risen to 1.54 and then improved to 1.28 - Continue to hold valsartan and HCTZ - AKI is likely due to sepsis and prerenal in setting of valsartan and HCTZ -- hold IVF  Mod/severe Ao stenosis - previously declined a TAVR but now considering it - Dr [name] Breed recommends f/u with structural HF team in 2 wks   Mild hypoxia - Resolved - Respiratory panel negative - CTA chest unremarkable     Essential hypertension -Holding valsartan/HCTZ   Normocytic anemia - Follow in a.m.   Splenic artery aneurysm - 7 mm in size - I discussed this incidental finding with vascular surgery> no need to f/u at her age    History of breast cancer is post radiation and chemotherapy   History of partial gastrectomy      Discharge Instructions  Discharge Instructions     Diet - low sodium heart healthy   Complete by: As directed    Increase activity slowly   Complete by: As directed       Allergies as of 02/09/2022   No Known Allergies      Medication List     TAKE these medications    acetaminophen 500 MG tablet Commonly known as: TYLENOL Take 1,000 mg by mouth every  6 (six) hours as needed for headache (pain).   alendronate 70 MG tablet Commonly known as: FOSAMAX Take 70 mg by mouth every _0  bpm. Exam Location:  Inpatient Procedure: 2D Echo, Color Doppler and Cardiac Doppler Indications:    Chest Pain  History:        Patient has prior history of Echocardiogram examinations, most                 recent 05/06/2021. Risk Factors:Hypertension and Dyslipidemia.  Sonographer:    Jefferey Pica Referring Phys: [id number] [place]  1. Normal LV function; calcified aortic valve with moderate to severe AS (mean gradient 31 mmHg; DI 0.24); severe MAC with moderate MS (mean gradient 9 mmHg).  2. Left ventricular ejection fraction, by estimation, is 60 to 65%. The left ventricle has normal function. The left ventricle has no regional wall motion abnormalities. There is moderate left ventricular hypertrophy. Left ventricular diastolic function  could not be evaluated.  3. Right ventricular systolic function is normal. The right ventricular size is normal. Tricuspid regurgitation signal is inadequate for assessing PA pressure.  4. Left atrial size was severely dilated.  5. The mitral valve is normal in structure. Mild mitral valve regurgitation. Moderate mitral stenosis. Severe mitral annular calcification.  6. The aortic valve is calcified. Aortic valve regurgitation is not visualized. Moderate to severe aortic valve stenosis.  7. The inferior vena cava is normal in size with greater than 50% respiratory variability, suggesting right atrial pressure of 3 mmHg. FINDINGS  Left Ventricle: Left ventricular ejection fraction, by estimation, is 60 to 65%. The left ventricle has normal function. The left ventricle has no regional wall motion abnormalities. The left ventricular internal cavity size was normal in size. There is  moderate left ventricular hypertrophy. Left ventricular diastolic function could not be  evaluated due to mitral annular calcification (moderate or greater). Left ventricular diastolic function could not be evaluated. Right Ventricle: The right ventricular size is normal. Right ventricular systolic function is normal. Tricuspid regurgitation signal is inadequate for assessing PA pressure. The tricuspid regurgitant velocity is 2.29 m/s, and with an assumed right atrial  pressure of 5 mmHg, the estimated right ventricular systolic pressure is 50.9 mmHg. Left Atrium: Left atrial size was severely dilated. Right Atrium: Right atrial size was normal in size. Pericardium: There is no evidence of pericardial effusion. Mitral Valve: The mitral valve is normal in structure. Severe mitral annular calcification. Mild mitral valve regurgitation. Moderate mitral valve stenosis. MV peak gradient, 18.2 mmHg. The mean mitral valve gradient is 9.0 mmHg. Tricuspid Valve: The tricuspid valve is normal in structure. Tricuspid valve regurgitation is mild . No evidence of tricuspid stenosis. Aortic Valve: The aortic valve is calcified. Aortic valve regurgitation is not visualized. Moderate to severe aortic stenosis is present. Aortic valve mean gradient measures 31.0 mmHg. Aortic valve peak gradient measures 53.9 mmHg. Pulmonic Valve: The pulmonic valve was normal in structure. Pulmonic valve regurgitation is trivial. No evidence of pulmonic stenosis. Aorta: The aortic root is normal in size and structure. Venous: The inferior vena cava is normal in size with greater than 50% respiratory variability, suggesting right atrial pressure of 3 mmHg. IAS/Shunts: No atrial level shunt detected by color flow Doppler. Additional Comments: Normal LV function; calcified aortic valve with moderate to severe AS (mean gradient 31 mmHg; DI 0.24); severe MAC with moderate MS (mean gradient 9 mmHg).  LEFT VENTRICLE PLAX 2D LVIDd:         4.60 cm Diastology LVIDs:         3.10 cm LV e' lateral: 5.40 cm/s LV PW:         1.40 cm LV IVS:         1.30 cm  RIGHT VENTRICLE             IVC RV Basal diam:  3.10 cm     IVC diam: 1.90 cm RV S prime:     14.30 cm/s TAPSE (M-mode): 1.9 cm LEFT  ATRIUM             Index        RIGHT ATRIUM           Index LA diam:        5.00 cm 3.12 cm/m   RA Area:     16.90 cm LA Vol (A2C):   80.2 ml 49.97 ml/m  RA Volume:   42.10 ml  26.23 ml/m LA Vol (A4C):   69.0 ml 42.99 ml/m LA Biplane Vol: 80.0 ml 49.85 ml/m  AORTIC VALVE                    PULMONIC VALVE AV Vmax:           367.00 cm/s  PV Vmax:       1.06 m/s AV Vmean:          261.000 cm/s PV Peak grad:  4.5 mmHg AV VTI:            0.841 m AV Peak Grad:      53.9 mmHg AV Mean Grad:      31.0 mmHg LVOT Vmax:         95.80 cm/s LVOT Vmean:        55.500 cm/s LVOT VTI:          0.206 m LVOT/AV VTI ratio: 0.24  AORTA Ao Root diam: 3.60 cm Ao Asc diam:  3.10 cm MITRAL VALVE             TRICUSPID VALVE MV Peak grad: 18.2 mmHg  TR Peak grad:   21.0 mmHg MV Mean grad: 9.0 mmHg   TR Vmax:        229.00 cm/s MV Vmax:      2.13 m/s MV Vmean:     142.3 cm/s SHUNTS                          Systemic VTI: 0.21 m Kirk Ruths MD Electronically signed by Kirk Ruths MD Signature Date/Time: 02/08/2022/3:10:01 PM    Final    DG ERCP  Result Date: 02/07/2022 CLINICAL DATA:  ERCP EXAM: ERCP TECHNIQUE: Multiple spot images obtained with the fluoroscopic device and submitted for interpretation post-procedure. FLUOROSCOPY: Refer to separate report COMPARISON:  None Available. FINDINGS: A total of 9 fluoroscopic spot images taken during ERCP are submitted for review. Initial images demonstrate scope overlying the abdomen with wire catheterization of the common bile duct. Contrast injection of the common bile duct demonstrates a dilated common bile duct. Balloon sweep(s) of the common bile duct is performed. A plastic biliary stent is left in place and the scope was withdrawn. IMPRESSION: ERCP images as described. Refer to procedure report for full details. These images were submitted for  radiologic interpretation only. Please see the procedural report for the amount of contrast and the fluoroscopy time utilized. Electronically Signed   By: Albin Felling M.D.   On: 02/07/2022 14:34   DG CHEST PORT 1 VIEW  Result Date: 02/07/2022 CLINICAL DATA:  Fluid overload. EXAM: PORTABLE CHEST 1 VIEW COMPARISON:  02/06/2022 FINDINGS: 0818 hours. The cardio pericardial silhouette is enlarged. Interstitial markings are diffusely coarsened with chronic features. Minimal atelectasis noted left base. Bones are diffusely demineralized. Left shoulder replacement. Telemetry leads overlie the chest. IMPRESSION: 1. Enlargement of the cardiopericardial silhouette. 2. Chronic interstitial coarsening with left base atelectasis. Electronically Signed   By: Misty Stanley M.D.   On: 02/07/2022 10:10  CT Angio Chest/Abd/Pel for Dissection W and/or Wo Contrast  Result Date: 02/06/2022 CLINICAL DATA:  Hypertension, chest pain EXAM: CT ANGIOGRAPHY CHEST, ABDOMEN AND PELVIS TECHNIQUE: Non-contrast CT of the chest was initially obtained. Multidetector CT imaging through the chest, abdomen and pelvis was performed using the standard protocol during bolus administration of intravenous contrast. Multiplanar reconstructed images and MIPs were obtained and reviewed to evaluate the vascular anatomy. RADIATION DOSE REDUCTION: This exam was performed according to the departmental dose-optimization program which includes automated exposure control, adjustment of the mA and/or kV according to patient size and/or use of iterative reconstruction technique. CONTRAST:  54m OMNIPAQUE IOHEXOL 350 MG/ML SOLN COMPARISON:  CT chest 01/30/2019, CT abdomen pelvis 11/15/2016 FINDINGS: CTA CHEST FINDINGS Cardiovascular: The thoracic aorta is normal in course and caliber. No intramural hematoma, dissection, or aneurysm. Moderate atherosclerotic calcification. Bovine arch anatomy with wide patency of the arch vasculature proximally. Global cardiac  size within normal limits though left ventricular hypertrophy is noted. Extensive calcification of the aortic valve leaflets. Extensive calcification of the mitral valve annulus. Mild coronary artery calcification. Trace pericardial effusion. Central pulmonary arteries are of normal caliber. Mediastinum/Nodes: The visualized thyroid is unremarkable. No pathologic thoracic adenopathy. The esophagus is unremarkable. Small hiatal hernia. Lungs/Pleura: Trace residual left pleural effusion or pleural thickening. Mild parenchymal scarring within the left upper lobe is unchanged. No new focal pulmonary nodules or infiltrates. No pneumothorax. Central airways are widely patent. Musculoskeletal: Status post left mastectomy. Osseous structures are diffusely osteopenic. Advanced degenerative changes are seen within the right shoulder. Left shoulder arthroplasty has been performed with anterior subluxation of the a humeral head in relation of the glenoid. Multiple healed left rib fractures are noted. No acute bone abnormality. No lytic or blastic bone lesion. Review of the MIP images confirms the above findings. CTA ABDOMEN AND PELVIS FINDINGS VASCULAR Aorta: Normal caliber aorta without aneurysm, dissection, vasculitis or significant stenosis. 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 Celiac axis is widely patent demonstrates normal anatomic configuration. No dissection. SMA: 50-75% stenosis of the origin. Distally widely patent without evidence of aneurysm or dissection. Renals: Both renal arteries are patent without evidence of aneurysm, dissection, vasculitis, fibromuscular dysplasia or significant stenosis. IMA: Patent without evidence of aneurysm, dissection, vasculitis or significant stenosis. Inflow: Patent without evidence of aneurysm, dissection, vasculitis or significant stenosis. Internal iliac arteries are patent bilaterally. Veins: No obvious venous abnormality within the limitations  of this arterial phase study. Review of the MIP images confirms the above findings. NON-VASCULAR Hepatobiliary: The gallbladder is distended and there is extensive pericholecystic inflammatory change in keeping with changes of acute cholecystitis. No loculated pericholecystic fluid collections are identified. There has developed marked intra and extrahepatic biliary ductal dilation to the level of the ampulla. While a distal obstructing lesion is not clearly identified, this is not optimally evaluated on this examination. No enhancing intrahepatic mass identified. Pancreas: Unremarkable Spleen: Unremarkable Adrenals/Urinary Tract: The adrenal glands are unremarkable. The kidneys are normal in position. Mild bilateral renal cortical atrophy, right greater than left. Multiple simple cortical cysts are seen within the kidneys bilaterally. No follow-up imaging is recommended for these lesions. No enhancing intrarenal masses are seen. No hydronephrosis. No intrarenal or ureteral calculi. The bladder is partially obscured by streak artifact, however, the visualized portion is unremarkable. Stomach/Bowel: Partial small bowel resection has been performed with wide patency of the surgical anastomosis noted within the anterior pelvis. Stomach, small bowel, and large bowel are otherwise unremarkable.  Appendix absent. No free intraperitoneal gas or fluid. Lymphatic: No pathologic adenopathy within the abdomen and pelvis. Reproductive: Uterus and bilateral adnexa are unremarkable. Other: There is diastasis of the rectus abdominus musculature with marked thinning of the abdominal wall anteriorly. No frank hernia, however. Rectum unremarkable. Musculoskeletal: Right total hip arthroplasty been performed. Degenerative changes are seen within the lumbar spine and left hip. No acute bone abnormality. No lytic or blastic bone lesion. Review of the MIP images confirms the above findings. IMPRESSION: 1. No evidence of thoracic or  abdominal aortic aneurysm or dissection. 2. Extensive calcification of the aortic valve leaflets. Left ventricular hypertrophy. Mild coronary artery calcification. Echocardiography may be helpful to assess the degree of valvular dysfunction. 3. Acute cholecystitis. No loculated pericholecystic fluid collections identified. 4. Interval development of marked intra and extrahepatic biliary ductal dilation to the level of the ampulla. While a distal obstructing lesion is not clearly identified, this is not optimally evaluated on this examination. Correlation with liver enzymes is recommended. If abnormal, this could be further assessed with ERCP or MRCP examination. 5. 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 6. 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 Wide patency of the celiac and inferior mesenteric artery origins. Aortic Atherosclerosis (ICD10-I70.0). Electronically Signed   By: Fidela Salisbury M.D.   On: 02/06/2022 21:51   DG Chest Portable 1 View  Result Date: 02/06/2022 CLINICAL DATA:  Chest pain and pressure EXAM: PORTABLE CHEST 1 VIEW COMPARISON:  05/05/2021 FINDINGS: Cardiomegaly. No acute airspace opacity. Status post left shoulder arthroplasty. Severe arthrosis of the right shoulder with high riding position of the humeral head consistent with chronic rotator cuff tear. IMPRESSION: Cardiomegaly without acute abnormality of the lungs. Electronically Signed   By: Delanna Ahmadi M.D.   On: 02/06/2022 19:38   DG Bone Survey Met  Result Date: 01/31/2022 CLINICAL DATA:  MGUS, evaluate for lytic lesions EXAM: METASTATIC BONE SURVEY COMPARISON:  Head CT from 05/05/2021 FINDINGS: Lateral view of the skull shows no definitive lytic lesions. Patient is edentulous. Upper extremities show significant degenerative change about the right shoulder joint. Left shoulder replacement is seen. No lytic or sclerotic lesions are noted in the upper extremities Multilevel degenerative changes of the  cervical spine are seen. No compression deformity is noted. No lytic or sclerotic lesions are seen. The thoracic spine demonstrates multilevel degenerative change. No compression deformity is seen. Lumbar spine also demonstrates multilevel degenerative change without compression deformity. Right hip replacement is noted in the pelvis. No lytic or sclerotic lesions are seen. Lower extremities show degenerative changes of the knee joints right greater than left. No lytic or sclerotic lesions are seen. No soft tissue abnormality is noted. Rib cage shows no lytic or sclerotic lesions. IMPRESSION: No definitive lytic or sclerotic lesions are noted. Electronically Signed   By: Inez Catalina M.D.   On: 01/31/2022 22:05   Labs:   Basic Metabolic Panel: Recent Labs  Lab 02/06/22 1926 02/07/22 0549 02/08/22 0347 02/09/22 0433  NA 139 139 139 140  K 4.3 4.0 3.5 3.6  CL 106 107 105 106  CO2 _0 GLUCOSE 115* 162* 88 103*  BUN 22 24* 20 17  CREATININE 1.39* 1.54* 1.28* 1.14*  CALCIUM 9.1 8.4* 8.3* 8.6*  MG  --  1.7  --   --   PHOS  --  4.7*  --   --      CBC: Recent Labs  Lab 02/06/22 1926 02/07/22 0549  02/08/22 0347  WBC 11.1* 17.3* 13.4*  NEUTROABS 7.2 15.1*  --   HGB 9.0* 8.2* 8.6*  HCT 28.4* 25.2* 26.9*  MCV 93.1 92.0 92.1  PLT 188 172 155         SIGNED:   Debbe Odea, MD  [facility] 02/09/2022, 2:48 PM

## 2022-02-09 NOTE — TOC Initial Note (Addendum)
Transition of Care [facility] - Initial/Assessment Note    Patient Details  Name: Brandy [name] MRN: [id number] Date of Birth: 1932/10/10  [facility] CM/SW Contact:    Zenon Mayo, RN Phone Number: 02/09/2022, 3:03 PM  Clinical Narrative:                 Patient is from home alone, has daughter , Brandy [name] is her support system , she takes her to her MD apts.  Patient has a walker and two w/chairs at home.  She has PCP apt on AVS.  Brandy [name] will transport her home today.  Patient is active with [facility] for HHPT, HHAIDE and [place] [employer] officer, museum , she would like to continue with these services.  Anderson Malta with [facility] confirmed.  Brandy [name] will transport her home today. NCM asked MD for [facility] orders.   Expected Discharge Plan: Home/Self Care Barriers to Discharge: No Barriers Identified   Patient Goals and CMS Choice Patient states their goals for this hospitalization and ongoing recovery are:: return home   Choice offered to / list presented to : NA  Expected Discharge Plan and Services Expected Discharge Plan: Home/Self Care In-house Referral: NA Discharge Planning Services: CM Consult Post Acute Care Choice: NA Living arrangements for the past 2 months: Single Family Home Expected Discharge Date: 02/09/22                 DME Agency: NA       HH Arranged: NA          Prior Living Arrangements/Services Living arrangements for the past 2 months: Single Family Home Lives with:: Self Patient language and need for interpreter reviewed:: Yes Do you feel safe going back to the place where you live?: Yes      Need for Family Participation in Patient Care: Yes (Comment) Care giver support system in place?: Yes (comment) Current home services: DME (walker, w/chair x 2) Criminal Activity/Legal Involvement Pertinent to Current Situation/Hospitalization: No - Comment as needed  Activities of Daily Living Home Assistive Devices/Equipment: Walker (specify type) ADL Screening  (condition at time of admission) Patient's cognitive ability adequate to safely complete daily activities?: Yes Is the patient deaf or have difficulty hearing?: No Does the patient have difficulty seeing, even when wearing glasses/contacts?: No Does the patient have difficulty concentrating, remembering, or making decisions?: No Patient able to express need for assistance with ADLs?: Yes Does the patient have difficulty dressing or bathing?: No Independently performs ADLs?: Yes (appropriate for developmental age) Does the patient have difficulty walking or climbing stairs?: Yes Weakness of Legs: Both Weakness of Arms/Hands: None  Permission Sought/Granted                  Emotional Assessment Appearance:: Appears stated age Attitude/Demeanor/Rapport: Engaged Affect (typically observed): Appropriate Orientation: : Oriented to Self, Oriented to Place, Oriented to  Time, Oriented to Situation Alcohol / Substance Use: Not Applicable Psych Involvement: No (comment)  Admission diagnosis:  Acute cholecystitis [K81.0] Cholecystitis [K81.9] Patient Active Problem List   Diagnosis Date Noted   Splenic artery aneurysm - 7 mm 02/09/2022   Cholangitis due to bile duct calculus with obstruction 02/09/2022   AKI (acute kidney injury) ([place]) 02/09/2022   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 02/09/2022   History of breast cancer 02/07/2022   History of partial gastrectomy 02/07/2022   Normocytic anemia 01/16/2022   Syncope and collapse    PNA (pneumonia) 05/06/2021   Acute on chronic respiratory failure with  hypoxemia ([place]) 05/05/2021   Hip fracture ([place]) 11/11/2018   Acute respiratory failure with hypoxia ([place]) 10/29/2018   S/P right hip fracture 10/24/2018   Acute on chronic diastolic CHF (congestive heart failure) ([place]) 01/19/2017   HCAP (healthcare-associated pneumonia) 12/11/2016   S/P exploratory laparotomy 11/17/2016   Small bowel obstruction ([place]) 11/17/2016    Pre-operative cardiovascular examination    Incarcerated incisional hernia s/p SB resection & repair 11/17/2016 11/15/2016   Acute renal failure (ARF) (HCC) 11/15/2016   Hyperglycemia 11/15/2016   Asthma without status asthmaticus 09/24/2016   Carpal tunnel syndrome 09/24/2016   Asthma 09/24/2016   Dark stools 09/24/2016   Degenerative arthritis of lumbar spine 09/24/2016   DJD (degenerative joint disease), cervical 09/24/2016   Dyspnea on exertion 09/24/2016   Generalized anxiety disorder 09/24/2016   Lumbosacral spondylosis without myelopathy 09/24/2016   Morbid (severe) obesity due to excess calories ([place]) 09/24/2016   Pernicious anemia 09/24/2016   Pure hypercholesterolemia 09/24/2016   Right knee pain 09/24/2016   Sleep disorder 09/24/2016   Vitamin B 12 deficiency 09/24/2016   Vitamin D deficiency 09/24/2016   Gastric ulcer    Iron deficiency anemia due to chronic blood loss    Abdominal pain, chronic, epigastric    Acute gastric ulcer without hemorrhage or perforation    Duodenal ulcer    Closed fracture of nasal septum 08/27/2015   Closed fracture of zygomatic arch ([place]) 08/27/2015   Protein-calorie malnutrition, severe ([place]) 12/18/2013   Choledocholithiasis 12/17/2013   Sepsis ([place]) 12/17/2013   Osteopenia 11/17/2012   Aortic valve stenosis 10/17/2012   Hx of radiation therapy    Depression    History of kidney stones    Urinary urgency    Gastroesophageal reflux disease without esophagitis    Hyperlipidemia    Essential hypertension    Bronchitis    Arthritis    Dysphagia    H/O hiatal hernia    Primary cancer of upper outer quadrant of left female breast ([place]) 12/01/2011   PCP:  Kathyrn Lass, MD Pharmacy:   [id number] [place] [id number] [id number]: [phone]: [phone]    Social Determinants of Health (SDOH) Interventions    Readmission Risk Interventions     02/09/2022    3:00 PM  Readmission Risk Prevention Plan  Transportation Screening Complete  PCP or Specialist Appt within 5-7 Days Complete  Home Care Screening Complete  Medication Review (RN CM) Complete

## 2022-02-12 LAB — CULTURE, BLOOD (ROUTINE X 2)
Culture: NO GROWTH
Culture: NO GROWTH
Special Requests: ADEQUATE
Special Requests: ADEQUATE

## 2022-02-16 DIAGNOSIS — K8020 Calculus of gallbladder without cholecystitis without obstruction: Secondary | ICD-10-CM | POA: Diagnosis not present

## 2022-02-16 DIAGNOSIS — I350 Nonrheumatic aortic (valve) stenosis: Secondary | ICD-10-CM | POA: Diagnosis not present

## 2022-02-16 DIAGNOSIS — I129 Hypertensive chronic kidney disease with stage 1 through stage 4 chronic kidney disease, or unspecified chronic kidney disease: Secondary | ICD-10-CM | POA: Diagnosis not present

## 2022-02-16 DIAGNOSIS — K8309 Other cholangitis: Secondary | ICD-10-CM | POA: Diagnosis not present

## 2022-02-16 DIAGNOSIS — D638 Anemia in other chronic diseases classified elsewhere: Secondary | ICD-10-CM | POA: Diagnosis not present

## 2022-02-24 ENCOUNTER — Telehealth: Payer: Self-pay

## 2022-02-24 DIAGNOSIS — F332 Major depressive disorder, recurrent severe without psychotic features: Secondary | ICD-10-CM | POA: Diagnosis not present

## 2022-02-24 NOTE — Telephone Encounter (Signed)
-----   Message from Irving Copas., MD sent at 02/24/2022  4:26 AM EDT ----- Regarding: RE: FU routine ERCP RG, Happy to be available.  Brandy [name], This patient has history of choledocholithiasis with biliary stent in place.  Repeat ERCP by October with me normal procedure time for stent pull and final cleanout possible cholangioscopy. Please let Dr. Lyndel Safe and I know when she is scheduled. Thanks. GM ----- Message ----- From: Jackquline Denmark, MD Sent: 02/22/2022   5:55 PM EDT To: Irving Copas., MD Subject: FU routine ERCP                                88yrold With asc cholangitis I did ERCP on 8/5. Has 10Fr CBD stent Took out several stones. CBD was markedly dilated. Not sure if any remains She still has gallbladder and surgery is very reluctant to do cholecystectomy d/t age and aortic stenosis.  Will need your expertise  Would you will have sometime in Oct to do ERCP?.Marland KitchenNot so sure if she needs spy. Appreciate your help as always  Brandy [name]

## 2022-02-24 NOTE — Progress Notes (Signed)
Brandy [name] MRN: [id number] DOB/AGE: 86-Aug-1934 86 y.o.  Primary Care Physician:Miller, Lattie Haw, MD Primary Cardiologist: Gayla Doss, MD   PROBLEM LIST: 1.  Moderate to severe aortic stenosis with aortic valve area of 0.76 cm and mean gradient 31 mmHg with a normal ejection fraction; no conduction abnormalities 2.  Hypertension 3.  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  Frailty and ambulates with walker 5.  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  CKD stage III   HISTORY OF PRESENT ILLNESS: The patient is an 88 year old with the above listed medical problems with previously been seen for consideration regarding her aortic stenosis in November of last year.  At that time she declined to pursue any further evaluation.  Earlier this month she was admitted with chest and abdominal pain and was found to have acute cholecystitis with hepatic bile duct dilatation.  She was treated with antibiotics and laparoscopic cholecystectomy was deferred at that time.  She was discharged home.  She returns for structural heart disease follow-up  The patient tells me that she is somewhat short of breath at home.  She used to be able to do her activities of daily living including cleaning her house and its exterior.  She is no longer able to do this.  She has noticed occasional paroxysmal nocturnal dyspnea maybe once or twice a month.  She has not noticed any peripheral edema.  She denies any presyncope or syncope.  She tells me that a few years ago she was able to do most everything that she needed to do and now she is much more limited.  This is even more so since the last time I saw her in November.  She is concerned about the state of her gallbladder and would like to have her laparoscopic cholecystectomy.  She is not having a lot of postprandial abdominal pain however.  She fortunately has not required any  emergency room visits or hospitalizations since being recently discharged.  Past Medical History:  Diagnosis Date   Anemia    Arthritis    shoulders   Asthma    mild, no inhalers used   Breast cancer ([place]) 12/18/11   left breast masectomy=metastatic ca in (1/1) lymph node ,invasive ductal ca,2 foci,,dcis,lymph ovascular invasion identified,surgical resection margins neg for ca,additional tissue=benign skin and subcutaneous tissue   Bronchitis    hx of;last time >28yrago   Depression    takes Celexa daily   Difficult intubation    Dysphagia    Full dentures    Gall stones 2015   GERD (gastroesophageal reflux disease)    takes Prilosec prn   H/O hiatal hernia    History of kidney stones    many yrs ago   Hx of radiation therapy 04/26/12 -06/10/12   left breast   Hyperlipidemia    takes Simvastatin daily   Hypertension    takes Amlodipine and Diovan daily   Insomnia    takes Ambien nightly   Urinary urgency     Past Surgical History:  Procedure Laterality Date   APPENDECTOMY     BILIARY DILATION  02/07/2022   Procedure: BILIARY DILATION;  Surgeon: GJackquline Denmark MD;  [facility];  Service: Gastroenterology;;   BILIARY STENT PLACEMENT  02/07/2022   Procedure: BILIARY STENT PLACEMENT;  Surgeon: GJackquline Denmark MD;  [facility];  Service: Gastroenterology;;   bladder tack  BREAST BIOPSY  1998   left    DILATION AND CURETTAGE OF UTERUS     ENDOSCOPIC RETROGRADE CHOLANGIOPANCREATOGRAPHY (ERCP) WITH PROPOFOL N/A 02/01/2014   Procedure: ENDOSCOPIC RETROGRADE CHOLANGIOPANCREATOGRAPHY (ERCP) WITH PROPOFOL;  Surgeon: [name], MD;  [facility];  Service: Endoscopy;  Laterality: N/A;   ERCP N/A 02/07/2022   Procedure: ENDOSCOPIC RETROGRADE CHOLANGIOPANCREATOGRAPHY (ERCP);  Surgeon: Jackquline Denmark, MD;  Location: [facility];  Service: Gastroenterology;  Laterality: N/A;   ESOPHAGOGASTRODUODENOSCOPY     ESOPHAGOGASTRODUODENOSCOPY N/A 06/26/2016    Procedure: ESOPHAGOGASTRODUODENOSCOPY (EGD);  Surgeon: [name], MD;  Location: [place];  Service: Endoscopy;  Laterality: N/A;   ESOPHAGOGASTRODUODENOSCOPY (EGD) WITH PROPOFOL  12/19/2013   Procedure: ESOPHAGOGASTRODUODENOSCOPY (EGD) WITH PROPOFOL;  Surgeon: Beryle Beams, MD;  Location: [place];  Service: Endoscopy;;   ESOPHAGOGASTRODUODENOSCOPY (EGD) WITH PROPOFOL N/A 09/10/2016   Procedure: ESOPHAGOGASTRODUODENOSCOPY (EGD) WITH PROPOFOL;  Surgeon: [name], MD;  [facility];  Service: Endoscopy;  Laterality: N/A;   EUS  06/04/2011   Procedure: UPPER ENDOSCOPIC ULTRASOUND (EUS) LINEAR;  Surgeon: Owens Loffler, MD;  [facility];  Service: Endoscopy;  Laterality: N/A;   EUS N/A 02/01/2014   Procedure: UPPER ENDOSCOPIC ULTRASOUND (EUS) LINEAR;  Surgeon: [name], MD;  [facility];  Service: Endoscopy;  Laterality: N/A;   EXPLORATORY LAPAROTOMY      biopsy of intra-abdominal mass   INTRAMEDULLARY (IM) NAIL INTERTROCHANTERIC Right 10/24/2018   Procedure: INTRAMEDULLARY (IM) NAIL INTERTROCHANTRIC;  Surgeon: Gaynelle Arabian, MD;  [facility];  Service: Orthopedics;  Laterality: Right;   LAPAROTOMY N/A 11/17/2016   Procedure: EXPLORATORY LAPAROTOMY WITH LYSIS OF ADHESIONS, SMALL BOWEL RESECTION, REPAIR OF INCARCERATED VENTRAL INCISIONAL HERNIA, INSERTION OF BIOLOGIC MESH PATCH,APPLICATION OF WOUND VAC DRESSING;  Surgeon: Armandina Gemma, MD;  [facility];  Service: General;  Laterality: N/A;   MASTECTOMY W/ SENTINEL NODE BIOPSY  12/18/2011   Procedure: MASTECTOMY WITH SENTINEL LYMPH NODE BIOPSY;  Surgeon: Rolm Bookbinder, MD;  Location: [place];  Service: General;  Laterality: Left;   PORT-A-CATH REMOVAL Right 06/15/2013   Procedure: REMOVAL PORT-A-CATH;  Surgeon: Rolm Bookbinder, MD;  Location: [place];  Service: General;  Laterality: Right;   PORTACATH PLACEMENT  01/27/2012   Procedure: INSERTION PORT-A-CATH;   Surgeon: Rolm Bookbinder, MD;  Location: [place];  Service: General;  Laterality: Right;  PORT PLACEMENT   REMOVAL OF STONES  02/07/2022   Procedure: REMOVAL OF STONES;  Surgeon: Jackquline Denmark, MD;  [facility];  Service: Gastroenterology;;   RIGHT/LEFT HEART CATH AND CORONARY ANGIOGRAPHY N/A 05/08/2021   Procedure: RIGHT/LEFT HEART CATH AND CORONARY ANGIOGRAPHY;  Surgeon: Jettie Booze, MD;  Location: [place] [facility];  Service: Cardiovascular;  Laterality: N/A;   SPHINCTEROTOMY  02/07/2022   Procedure: SPHINCTEROTOMY;  Surgeon: Jackquline Denmark, MD;  [facility];  Service: Gastroenterology;;   TOTAL HIP ARTHROPLASTY Right 11/14/2018   Procedure: REMOVAL OF INTRAMEDULLARY NAIL AND POSTERIOR TOTAL HIP ARTHROPLASTY;  Surgeon: Gaynelle Arabian, MD;  [facility];  Service: Orthopedics;  Laterality: Right;   TOTAL SHOULDER REPLACEMENT  2011   left   TUBAL LIGATION      Family History  Problem Relation Age of Onset   Prostate cancer Father    Breast cancer Other     Social History   Socioeconomic History   Marital status: Married    Spouse name: Not on file   Number of children: Not on file   Years of  education: Not on file   Highest education level: Not on file  Occupational History   Occupation: retired  Tobacco Use   Smoking status: Never   Smokeless tobacco: Never  Vaping Use   Vaping Use: Never used  Substance and Sexual Activity   Alcohol use: No   Drug use: No   Sexual activity: Yes    Birth control/protection: Surgical    Comment: mensus age 46, 1st pregnancy 10, no hrt gg4,p3, 1 babay lived a few hours complications  Other Topics Concern   Not on file  Social History Narrative   Not on file   Social Determinants of Health   Financial Resource Strain: Not on file  Food Insecurity: Unknown (10/29/2018)   Hunger Vital Sign    Worried About Running Out of Food in the Last Year: Patient refused    [place] in the Last  Year: Patient refused  Transportation Needs: Not on file  Physical Activity: Unknown (10/29/2018)   Exercise Vital Sign    Days of Exercise per Week: Patient refused    Minutes of Exercise per Session: Patient refused  Stress: Not on file  Social Connections: Unknown (10/29/2018)   Social Connection and Isolation Panel [NHANES]    Frequency of Communication with Friends and Family: Patient refused    Frequency of Social Gatherings with Friends and Family: Patient refused    Attends Religious Services: Patient refused    Active Member of Clubs or Organizations: Patient refused    Attends Archivist Meetings: Patient refused    Marital Status: Patient refused  Intimate Partner Violence: Unknown (10/29/2018)   Humiliation, Afraid, Rape, and Kick questionnaire    Fear of Current or Ex-Partner: Patient refused    Emotionally Abused: Patient refused    Physically Abused: Patient refused    Sexually Abused: Patient refused     Prior to Admission medications   Medication Sig Start Date End Date Taking? Authorizing Provider  acetaminophen (TYLENOL) 500 MG tablet Take 1,000 mg by mouth every 6 (six) hours as needed (pain).    [provider]  alendronate (FOSAMAX) 70 MG tablet Take 70 mg by mouth once a week. sunday 04/01/21   [provider]  amoxicillin-clavulanate (AUGMENTIN) 875-125 MG tablet Take 1 tablet by mouth 2 (two) times daily for 3 days. 05/09/21 05/12/21  Jennye Boroughs, MD  atorvastatin (LIPITOR) 40 MG tablet Take 40 mg by mouth daily.  04/27/16   [provider]  Cyanocobalamin (VITAMIN B-12 PO) Take 1 tablet by mouth daily.    [provider]  DULoxetine (CYMBALTA) 20 MG capsule Take 20 mg by mouth 2 (two) times daily. 04/17/21   [provider]  ferrous sulfate 325 (65 FE) MG tablet Take 1 tablet (325 mg total) by mouth daily. 05/09/21   Jennye Boroughs, MD  gabapentin (NEURONTIN) 300 MG capsule TAKE 1 CAPSULE BY MOUTH EVERYDAY AT  BEDTIME Patient taking differently: Take 300 mg by mouth at bedtime. 11/08/20   Nicholas Lose, MD  Multiple Vitamin (MULTIVITAMIN WITH MINERALS) TABS tablet Take 1 tablet by mouth daily.    [provider]  omeprazole (PRILOSEC) 40 MG capsule Take 1 capsule (40 mg total) by mouth daily as needed (acid reflux). 05/09/21   Jennye Boroughs, MD  traMADol (ULTRAM) 50 MG tablet Take 2 tablets (100 mg total) by mouth 4 (four) times daily. 11/16/18   Edmisten, Kristie L, PA  valsartan-hydrochlorothiazide (DIOVAN-HCT) 80-12.5 MG tablet Take 1 tablet by mouth daily.  [provider]  Vitamin D, Ergocalciferol, (DRISDOL) 50000 units CAPS capsule Take 50,000 Units by mouth every 7 (seven) days. Sunday    [provider]    No Known Allergies  REVIEW OF SYSTEMS:  General: no fevers/chills/night sweats Eyes: no blurry vision, diplopia, or amaurosis ENT: no sore throat or hearing loss Resp: no cough, wheezing, or hemoptysis CV: no edema or palpitations GI: no abdominal pain, nausea, vomiting, diarrhea, or constipation GU: no dysuria, frequency, or hematuria Skin: no rash Neuro: no headache, numbness, tingling, or weakness of extremities Musculoskeletal: no joint pain or swelling Heme: no bleeding, DVT, or easy bruising Endo: no polydipsia or polyuria  BP (!) 142/70   Pulse 91   Ht _0  (1.499 m)   Wt 137 lb 3.2 oz (62.2 kg)   SpO2 95%   BMI 27.71 kg/m   PHYSICAL EXAM: GEN:  AO x 3 in no acute distress, frail HEENT: normal Dentition: Has both upper and lower dentures Neck: JVP normal. +1 carotid upstrokes without bruits. No thyromegaly. Lungs: equal expansion, clear bilaterally CV: Apex is discrete and nondisplaced, RRR with 4/6 SEM Abd: soft, non-tender, non-distended; no bruit; positive bowel sounds Ext: no edema, ecchymoses, or cyanosis Vascular: 2+ femoral pulses, 2+ radial pulses       Skin: warm and dry without rash Neuro: CN II-XII grossly intact; motor  and sensory grossly intact    DATA AND STUDIES:  EKG: Normal sinus rhythm with left ventricular hypertrophy and strain  2D ECHO: August 2023: Severely calcified aortic valve with moderate to severe aortic stenosis with mean gradient 31 mmHg and moderate mitral stenosis with severe MAC with a mean gradient of 9 mmHg with ejection fraction of 60 to 65%  CARDIAC CATH: November 2022 minimal obstructive coronary artery disease with right dominant system  STS RISK CALCULATOR: 6.7%  NHYA CLASS: 2-3    ASSESSMENT AND PLAN:   Aortic valve stenosis, etiology of cardiac valve disease unspecified  Nonrheumatic mitral valve stenosis  Stage 3a chronic kidney disease ([place])  The patient has developed lifestyle limiting dyspnea with [place] heart association class II-III symptoms.  These are little bit worse than before.  Additionally she now needs laparoscopic surgery for her gallbladder.  She is now more amenable to considering an aortic valve intervention.  She had a coronary angiography study in November 2022 so I do not think this needs to be repeated particularly given the lack of angina.  She has dentures in place so a dental evaluation is also not needed.  We will refer her for CT scan and a cardiothoracic surgery consultation.  Further recommendations will follow after review of her CT scan.  I have personally reviewed the patients imaging data as summarized above.  I have reviewed the natural history of aortic stenosis with the patient and family members who are present today. We have discussed the limitations of medical therapy and the poor prognosis associated with symptomatic aortic stenosis. We have also reviewed potential treatment options, including palliative medical therapy, conventional surgical aortic valve replacement, and transcatheter aortic valve replacement. We discussed treatment options in the context of this patient's specific comorbid medical conditions.   All of the  patient's questions were answered today. Will make further recommendations based on the results of studies outlined above.   Early Osmond, MD  02/27/2022 10:30 AM    [facility] [address], [place], [place]  [address] Phone: [phone]; Fax: [phone]

## 2022-02-25 ENCOUNTER — Other Ambulatory Visit: Payer: Self-pay

## 2022-02-25 DIAGNOSIS — K8050 Calculus of bile duct without cholangitis or cholecystitis without obstruction: Secondary | ICD-10-CM

## 2022-02-25 NOTE — Telephone Encounter (Signed)
Thanks for update Patty. GM

## 2022-02-25 NOTE — Telephone Encounter (Signed)
ERCP scheduled, pt instructed and medications reviewed.  Patient instructions mailed to home and sent to My Chart.  Patient to call with any questions or concerns.  

## 2022-02-25 NOTE — Telephone Encounter (Addendum)
ERCP has been scheduled for 03/19/22 at 315 pm at [facility] with GM   Left message on machine to call back

## 2022-02-27 ENCOUNTER — Encounter: Payer: Self-pay | Admitting: Internal Medicine

## 2022-02-27 ENCOUNTER — Ambulatory Visit: Payer: Medicare HMO | Admitting: Internal Medicine

## 2022-02-27 VITALS — BP 142/70 | HR 91 | Ht 59.0 in | Wt 137.2 lb

## 2022-02-27 DIAGNOSIS — I350 Nonrheumatic aortic (valve) stenosis: Secondary | ICD-10-CM

## 2022-02-27 DIAGNOSIS — N1831 Chronic kidney disease, stage 3a: Secondary | ICD-10-CM

## 2022-02-27 DIAGNOSIS — I342 Nonrheumatic mitral (valve) stenosis: Secondary | ICD-10-CM

## 2022-02-27 LAB — BASIC METABOLIC PANEL
BUN/Creatinine Ratio: 23 (ref 12–28)
BUN: 21 mg/dL (ref 8–27)
CO2: 22 mmol/L (ref 20–29)
Calcium: 9.3 mg/dL (ref 8.7–10.3)
Chloride: 105 mmol/L (ref 96–106)
Creatinine, Ser: 0.92 mg/dL (ref 0.57–1.00)
Glucose: 97 mg/dL (ref 70–99)
Potassium: 4.5 mmol/L (ref 3.5–5.2)
Sodium: 141 mmol/L (ref 134–144)
eGFR: 61 mL/min/{1.73_m2} (ref >59)

## 2022-02-27 NOTE — Patient Instructions (Signed)
Medication Instructions:  NO CHANGES  *If you need a refill on your cardiac medications before your next appointment, please call your pharmacy*   Lab Work: TODAY BMET  If you have labs (blood work) drawn today and your tests are completely normal, you will receive your results only by: [place] (if you have MyChart) OR A paper copy in the mail If you have any lab test that is abnormal or we need to change your treatment, we will call you to review the results.   Testing/Procedures: NONE    Follow-Up: At [facility], you and your health needs are our priority.  As part of our continuing mission to provide you with exceptional heart care, we have created designated Provider Care Teams.  These Care Teams include your primary Cardiologist (physician) and Advanced Practice Providers (APPs -  Physician Assistants and Nurse Practitioners) who all work together to provide you with the care you need, when you need it.  We recommend signing up for the patient portal called "MyChart".  Sign up information is provided on this After Visit Summary.  MyChart is used to connect with patients for Virtual Visits (Telemedicine).  Patients are able to view lab/test results, encounter notes, upcoming appointments, etc.  Non-urgent messages can be sent to your provider as well.   To learn more about what you can do with MyChart, go to [email or web address].    Your next appointment:   FOLLOW UP TO BE DETERMINED AFTER PROCEDURE IS DONE   The format for your next appointment:     Provider:       Other Instructions NONE  Important Information About Sugar

## 2022-02-27 NOTE — Progress Notes (Signed)
Pre Surgical Assessment: 5 M Walk Test  51M=16.59f  5 Meter Walk Test- trial 1: 14.41 seconds 5 Meter Walk Test- trial 2: 12.70 seconds 5 Meter Walk Test- trial 3: 15.17 seconds 5 Meter Walk Test Average: 14.09 seconds

## 2022-03-02 ENCOUNTER — Other Ambulatory Visit: Payer: Self-pay

## 2022-03-02 DIAGNOSIS — I350 Nonrheumatic aortic (valve) stenosis: Secondary | ICD-10-CM

## 2022-03-02 DIAGNOSIS — R55 Syncope and collapse: Secondary | ICD-10-CM

## 2022-03-02 MED ORDER — METOPROLOL TARTRATE 100 MG PO TABS: ORAL_TABLET | ORAL | 0 refills | Status: DC

## 2022-03-12 ENCOUNTER — Encounter (HOSPITAL_COMMUNITY): Payer: Self-pay | Admitting: Gastroenterology

## 2022-03-16 ENCOUNTER — Ambulatory Visit (HOSPITAL_COMMUNITY)
Admission: RE | Admit: 2022-03-16 | Discharge: 2022-03-16 | Disposition: A | Discharge status: Home / Self Care | Payer: Medicare HMO | Source: Ambulatory Visit | Attending: Internal Medicine | Admitting: Internal Medicine

## 2022-03-16 ENCOUNTER — Encounter (HOSPITAL_COMMUNITY): Payer: Self-pay

## 2022-03-16 DIAGNOSIS — I350 Nonrheumatic aortic (valve) stenosis: Secondary | ICD-10-CM

## 2022-03-16 DIAGNOSIS — K5730 Diverticulosis of large intestine without perforation or abscess without bleeding: Secondary | ICD-10-CM | POA: Diagnosis not present

## 2022-03-16 DIAGNOSIS — Z01818 Encounter for other preprocedural examination: Secondary | ICD-10-CM | POA: Diagnosis not present

## 2022-03-16 DIAGNOSIS — R55 Syncope and collapse: Secondary | ICD-10-CM | POA: Diagnosis not present

## 2022-03-16 DIAGNOSIS — Z01810 Encounter for preprocedural cardiovascular examination: Secondary | ICD-10-CM | POA: Diagnosis not present

## 2022-03-16 DIAGNOSIS — I700 Atherosclerosis of aorta: Secondary | ICD-10-CM | POA: Diagnosis not present

## 2022-03-16 MED ORDER — IOHEXOL 350 MG/ML SOLN
100.0000 mL | Freq: Once | INTRAVENOUS | Status: AC | PRN
  Administered 2022-03-16: 100 mL via INTRAVENOUS

## 2022-03-18 ENCOUNTER — Encounter: Payer: Self-pay | Admitting: Physician Assistant

## 2022-03-19 ENCOUNTER — Ambulatory Visit (HOSPITAL_BASED_OUTPATIENT_CLINIC_OR_DEPARTMENT_OTHER): Payer: Medicare HMO | Admitting: Anesthesiology

## 2022-03-19 ENCOUNTER — Ambulatory Visit (HOSPITAL_COMMUNITY): Payer: Medicare HMO

## 2022-03-19 ENCOUNTER — Ambulatory Visit (HOSPITAL_COMMUNITY): Payer: Medicare HMO | Admitting: Anesthesiology

## 2022-03-19 ENCOUNTER — Encounter (HOSPITAL_COMMUNITY): Payer: Self-pay | Admitting: Gastroenterology

## 2022-03-19 ENCOUNTER — Encounter (HOSPITAL_COMMUNITY)
Admission: RE | Disposition: A | Discharge status: Home / Self Care | Payer: Self-pay | Source: Home / Self Care | Attending: Gastroenterology

## 2022-03-19 ENCOUNTER — Ambulatory Visit (HOSPITAL_COMMUNITY)
Admission: RE | Admit: 2022-03-19 | Discharge: 2022-03-19 | Disposition: A | Discharge status: Home / Self Care | Payer: Medicare HMO | Attending: Gastroenterology | Admitting: Gastroenterology

## 2022-03-19 ENCOUNTER — Other Ambulatory Visit: Payer: Self-pay

## 2022-03-19 DIAGNOSIS — Z4659 Encounter for fitting and adjustment of other gastrointestinal appliance and device: Secondary | ICD-10-CM

## 2022-03-19 DIAGNOSIS — K2289 Other specified disease of esophagus: Secondary | ICD-10-CM | POA: Diagnosis not present

## 2022-03-19 DIAGNOSIS — K3189 Other diseases of stomach and duodenum: Secondary | ICD-10-CM | POA: Diagnosis not present

## 2022-03-19 DIAGNOSIS — I739 Peripheral vascular disease, unspecified: Secondary | ICD-10-CM

## 2022-03-19 DIAGNOSIS — I110 Hypertensive heart disease with heart failure: Secondary | ICD-10-CM

## 2022-03-19 DIAGNOSIS — K219 Gastro-esophageal reflux disease without esophagitis: Secondary | ICD-10-CM | POA: Insufficient documentation

## 2022-03-19 DIAGNOSIS — K8051 Calculus of bile duct without cholangitis or cholecystitis with obstruction: Secondary | ICD-10-CM

## 2022-03-19 DIAGNOSIS — I509 Heart failure, unspecified: Secondary | ICD-10-CM | POA: Diagnosis not present

## 2022-03-19 DIAGNOSIS — K838 Other specified diseases of biliary tract: Secondary | ICD-10-CM | POA: Diagnosis not present

## 2022-03-19 DIAGNOSIS — F418 Other specified anxiety disorders: Secondary | ICD-10-CM | POA: Insufficient documentation

## 2022-03-19 DIAGNOSIS — Z853 Personal history of malignant neoplasm of breast: Secondary | ICD-10-CM | POA: Insufficient documentation

## 2022-03-19 DIAGNOSIS — K2950 Unspecified chronic gastritis without bleeding: Secondary | ICD-10-CM | POA: Diagnosis not present

## 2022-03-19 DIAGNOSIS — K8050 Calculus of bile duct without cholangitis or cholecystitis without obstruction: Secondary | ICD-10-CM | POA: Insufficient documentation

## 2022-03-19 DIAGNOSIS — J45909 Unspecified asthma, uncomplicated: Secondary | ICD-10-CM | POA: Insufficient documentation

## 2022-03-19 DIAGNOSIS — K449 Diaphragmatic hernia without obstruction or gangrene: Secondary | ICD-10-CM | POA: Insufficient documentation

## 2022-03-19 DIAGNOSIS — X58XXXA Exposure to other specified factors, initial encounter: Secondary | ICD-10-CM | POA: Insufficient documentation

## 2022-03-19 DIAGNOSIS — S27818A Other injury of esophagus (thoracic part), initial encounter: Secondary | ICD-10-CM | POA: Insufficient documentation

## 2022-03-19 DIAGNOSIS — I340 Nonrheumatic mitral (valve) insufficiency: Secondary | ICD-10-CM | POA: Diagnosis not present

## 2022-03-19 DIAGNOSIS — Z79899 Other long term (current) drug therapy: Secondary | ICD-10-CM | POA: Diagnosis not present

## 2022-03-19 HISTORY — PX: ENDOSCOPIC RETROGRADE CHOLANGIOPANCREATOGRAPHY (ERCP) WITH PROPOFOL: SHX5810

## 2022-03-19 HISTORY — PX: BILIARY DILATION: SHX6850

## 2022-03-19 HISTORY — PX: SPYGLASS CHOLANGIOSCOPY: SHX5441

## 2022-03-19 HISTORY — PX: BIOPSY: SHX5522

## 2022-03-19 HISTORY — PX: REMOVAL OF STONES: SHX5545

## 2022-03-19 HISTORY — PX: STENT REMOVAL: SHX6421

## 2022-03-19 SURGERY — ENDOSCOPIC RETROGRADE CHOLANGIOPANCREATOGRAPHY (ERCP) WITH PROPOFOL
Anesthesia: General

## 2022-03-19 MED ORDER — PROPOFOL 10 MG/ML IV BOLUS
INTRAVENOUS | Status: DC | PRN
  Administered 2022-03-19: 100 mg via INTRAVENOUS

## 2022-03-19 MED ORDER — CIPROFLOXACIN IN D5W 400 MG/200ML IV SOLN
INTRAVENOUS | Status: DC | PRN
  Administered 2022-03-19: 400 mg via INTRAVENOUS

## 2022-03-19 MED ORDER — GLUCAGON HCL RDNA (DIAGNOSTIC) 1 MG IJ SOLR
INTRAMUSCULAR | Status: AC
  Filled 2022-03-19: qty 2

## 2022-03-19 MED ORDER — OMEPRAZOLE 40 MG PO CPDR: 40.0000 mg | DELAYED_RELEASE_CAPSULE | Freq: Every day | ORAL | 12 refills | Status: AC

## 2022-03-19 MED ORDER — LACTATED RINGERS IV SOLN: INTRAVENOUS | Status: DC | PRN

## 2022-03-19 MED ORDER — FENTANYL CITRATE (PF) 100 MCG/2ML IJ SOLN
INTRAMUSCULAR | Status: AC
  Filled 2022-03-19: qty 2

## 2022-03-19 MED ORDER — DICLOFENAC SUPPOSITORY 100 MG
RECTAL | Status: AC
  Filled 2022-03-19: qty 1

## 2022-03-19 MED ORDER — CIPROFLOXACIN IN D5W 400 MG/200ML IV SOLN
INTRAVENOUS | Status: AC
  Filled 2022-03-19: qty 200

## 2022-03-19 MED ORDER — PHENYLEPHRINE HCL (PRESSORS) 10 MG/ML IV SOLN
INTRAVENOUS | Status: AC
  Filled 2022-03-19: qty 1

## 2022-03-19 MED ORDER — FENTANYL CITRATE (PF) 100 MCG/2ML IJ SOLN
INTRAMUSCULAR | Status: DC | PRN
  Administered 2022-03-19: 100 ug via INTRAVENOUS

## 2022-03-19 MED ORDER — GLUCAGON HCL RDNA (DIAGNOSTIC) 1 MG IJ SOLR
INTRAMUSCULAR | Status: DC | PRN
  Administered 2022-03-19 (×2): .25 mg via INTRAVENOUS

## 2022-03-19 MED ORDER — CIPROFLOXACIN HCL 500 MG PO TABS: 500.0000 mg | ORAL_TABLET | Freq: Two times a day (BID) | ORAL | 0 refills | Status: AC

## 2022-03-19 MED ORDER — DEXAMETHASONE SODIUM PHOSPHATE 10 MG/ML IJ SOLN
INTRAMUSCULAR | Status: DC | PRN
  Administered 2022-03-19: 4 mg via INTRAVENOUS

## 2022-03-19 MED ORDER — ONDANSETRON HCL 4 MG/2ML IJ SOLN
INTRAMUSCULAR | Status: DC | PRN
  Administered 2022-03-19: 4 mg via INTRAVENOUS

## 2022-03-19 MED ORDER — LIDOCAINE HCL (CARDIAC) PF 100 MG/5ML IV SOSY
PREFILLED_SYRINGE | INTRAVENOUS | Status: DC | PRN
  Administered 2022-03-19: 60 mg via INTRAVENOUS

## 2022-03-19 MED ORDER — DICLOFENAC SUPPOSITORY 100 MG
RECTAL | Status: DC | PRN
  Administered 2022-03-19: 100 mg via RECTAL

## 2022-03-19 MED ORDER — ROCURONIUM BROMIDE 100 MG/10ML IV SOLN
INTRAVENOUS | Status: DC | PRN
  Administered 2022-03-19: 10 mg via INTRAVENOUS

## 2022-03-19 MED ORDER — SUCCINYLCHOLINE CHLORIDE 200 MG/10ML IV SOSY
PREFILLED_SYRINGE | INTRAVENOUS | Status: DC | PRN
  Administered 2022-03-19: 100 mg via INTRAVENOUS

## 2022-03-19 MED ORDER — PHENYLEPHRINE HCL-NACL 20-0.9 MG/250ML-% IV SOLN
INTRAVENOUS | Status: DC | PRN
  Administered 2022-03-19: 50 ug/min via INTRAVENOUS

## 2022-03-19 NOTE — Discharge Instructions (Signed)
YOU HAD AN ENDOSCOPIC PROCEDURE TODAY: Refer to the procedure report and other information in the discharge instructions given to you for any specific questions about what was found during the examination. If this information does not answer your questions, please call [facility] office at [phone] to clarify.   YOU SHOULD EXPECT: Some feelings of bloating in the abdomen. Passage of more gas than usual. Walking can help get rid of the air that was put into your GI tract during the procedure and reduce the bloating. If you had a lower endoscopy (such as a colonoscopy or flexible sigmoidoscopy) you may notice spotting of blood in your stool or on the toilet paper. Some abdominal soreness may be present for a day or two, also.  DIET: Your first meal following the procedure should be a light meal and then it is ok to progress to your normal diet. A half-sandwich or bowl of soup is an example of a good first meal. Heavy or fried foods are harder to digest and may make you feel nauseous or bloated. Drink plenty of fluids but you should avoid alcoholic beverages for 24 hours. If you had a esophageal dilation, please see attached instructions for diet.    ACTIVITY: Your care partner should take you home directly after the procedure. You should plan to take it easy, moving slowly for the rest of the day. You can resume normal activity the day after the procedure however YOU SHOULD NOT DRIVE, use power tools, machinery or perform tasks that involve climbing or major physical exertion for 24 hours (because of the sedation medicines used during the test).   SYMPTOMS TO REPORT IMMEDIATELY: A gastroenterologist can be reached at any hour. Please call [phone]  for any of the following symptoms:   Following upper endoscopy (EGD, EUS, ERCP, esophageal dilation) Vomiting of blood or coffee ground material  New, significant abdominal pain  New, significant chest pain or pain under the shoulder blades  Painful or  persistently difficult swallowing  New shortness of breath  Black, tarry-looking or red, bloody stools  FOLLOW UP:  If any biopsies were taken you will be contacted by phone or by letter within the next 1-3 weeks. Call [phone]  if you have not heard about the biopsies in 3 weeks.  Please also call with any specific questions about appointments or follow up tests.  

## 2022-03-19 NOTE — Transfer of Care (Signed)
Immediate Anesthesia Transfer of Care Note  Patient: Brandy [name]  Procedure(s) Performed: ENDOSCOPIC RETROGRADE CHOLANGIOPANCREATOGRAPHY (ERCP) WITH PROPOFOL STENT REMOVAL BILIARY DILATION REMOVAL OF STONES SPYGLASS CHOLANGIOSCOPY BIOPSY  Patient Location: PACU  Anesthesia Type:General  Level of Consciousness: awake, alert  and patient cooperative  Airway & Oxygen Therapy: Patient Spontanous Breathing and Patient connected to face mask oxygen  Post-op Assessment: Report given to RN, Post -op Vital signs reviewed and stable and Patient moving all extremities X 4  Post vital signs: Reviewed and stable  Last Vitals:  Vitals Value Taken Time  BP 136/59 03/19/22 1707  Temp 37 C 03/19/22 1707  Pulse 80 03/19/22 1714  Resp 17 03/19/22 1714  SpO2 93 % 03/19/22 1714  Vitals shown include unvalidated device data.  Last Pain:  Vitals:   03/19/22 1707  TempSrc: Oral  PainSc: 0-No pain         Complications: No notable events documented.

## 2022-03-19 NOTE — Anesthesia Preprocedure Evaluation (Addendum)
Anesthesia Evaluation  Patient identified by MRN, date of birth, ID band Patient awake    Reviewed: Allergy & Precautions, NPO status , Patient's Chart, lab work & pertinent test results, reviewed documented beta blocker date and time   History of Anesthesia Complications (+) DIFFICULT AIRWAY and history of anesthetic complications  Airway Mallampati: I  TM Distance: <3 FB Neck ROM: Full    Dental  (+) Edentulous Upper, Edentulous Lower   Pulmonary asthma , pneumonia, resolved,     + decreased breath sounds      Cardiovascular hypertension, Pt. on medications and Pt. on home beta blockers + Peripheral Vascular Disease and +CHF   Rhythm:Regular Rate:Normal  Echo: 1. Left ventricular ejection fraction, by estimation, is 70 to 75%. The  left ventricle has hyperdynamic function. The left ventricle has no  regional wall motion abnormalities. There is mild left ventricular  hypertrophy. Left ventricular diastolic  parameters are consistent with Grade I diastolic dysfunction (impaired  relaxation). Elevated left atrial pressure.  2. Right ventricular systolic function is normal. The right ventricular  size is normal.  3. Mild mitral valve regurgitation. Moderate to severe mitral annular  calcification.  4. AV is thickened, calcified with restricted motion. Peak and mean  gradients through the valve are 64 and 38 mm Hg respectively.  Dimensionless index is 0.27 Consistent with moderate to severe AS>  Compared to echo report from 2021, mean gradient is  increased (62m to 38 mm ) . The aortic valve is abnormal. Aortic valve  regurgitation is not visualized.  5. The inferior vena cava is normal in size with greater than 50%  respiratory variability, suggesting right atrial pressure of 3 mmHg.    Neuro/Psych PSYCHIATRIC DISORDERS Anxiety Depression    GI/Hepatic Neg liver ROS, hiatal hernia, PUD, GERD  Medicated,  Endo/Other   negative endocrine ROSHyperlipidemia Hx/o left breast Ca S/P mastectomy and RT  Renal/GU Renal disease  negative genitourinary   Musculoskeletal  (+) Arthritis , Osteoarthritis,    Abdominal Normal abdominal exam  (+)   Peds  Hematology  (+) Blood dyscrasia, anemia ,   Anesthesia Other Findings   Reproductive/Obstetrics                           Anesthesia Physical  Anesthesia Plan  ASA: 3  Anesthesia Plan: General   Post-op Pain Management: Minimal or no pain anticipated   Induction: Intravenous and Cricoid pressure planned  PONV Risk Score and Plan: 1 and Ondansetron  Airway Management Planned: Oral ETT  Additional Equipment: None  Intra-op Plan:   Post-operative Plan: Extubation in OR  Informed Consent: I have reviewed the patients History and Physical, chart, labs and discussed the procedure including the risks, benefits and alternatives for the proposed anesthesia with the patient or authorized representative who has indicated his/her understanding and acceptance.     Dental advisory given  Plan Discussed with: CRNA  Anesthesia Plan Comments:         Anesthesia Quick Evaluation

## 2022-03-19 NOTE — H&P (Signed)
GASTROENTEROLOGY PROCEDURE H&P NOTE   Primary Care Physician: Kathyrn Lass, MD  HPI: Brandy [name] is a 88 y.o. female who presents for ERCP for biliary stent exchange/removal in setting of previous choledocholithiasis status post removal by Dr. Lyndel Safe earlier this year.  Past Medical History:  Diagnosis Date   Anemia    Arthritis    shoulders   Asthma    mild, no inhalers used   Breast cancer ([place]) 12/18/2011   left breast masectomy=metastatic ca in (1/1) lymph node ,invasive ductal ca,2 foci,,dcis,lymph ovascular invasion identified,surgical resection margins neg for ca,additional tissue=benign skin and subcutaneous tissue   Bronchitis    hx of;last time >54yr[age]   Depression    takes Celexa daily   Difficult intubation    Full dentures    Gall stones 2015   GERD (gastroesophageal reflux disease)    takes Prilosec prn   H/O hiatal hernia    History of kidney stones    many yrs ago   Hx of radiation therapy 04/26/12 -06/10/12   left breast   Hyperlipidemia    takes Simvastatin daily   Hypertension    takes Amlodipine and Diovan daily   Insomnia    takes Ambien nightly   Urinary urgency    Past Surgical History:  Procedure Laterality Date   APPENDECTOMY     BILIARY DILATION  02/07/2022   Procedure: BILIARY DILATION;  Surgeon: GJackquline Denmark MD;  [facility]  Service: Gastroenterology;;   BILIARY STENT PLACEMENT  02/07/2022   Procedure: BILIARY STENT PLACEMENT;  Surgeon: GJackquline Denmark MD;  [facility]  Service: Gastro[facility]ology;;   bladder tack     BREAST BIOPSY  1998   left    DILATION AND CURETTAGE OF UTERUS     ENDOSCOPIC RETROGRADE CHOLANGIOPANCREATOGRAPHY (ERCP) WITH PROPOFOL N/A 02/01/2014   Procedure: ENDOSCOPIC RETROGRADE CHOLANGIOPANCREATOGRAPHY (ERCP) WITH PROPOFOL;  Surgeon: DMilus Banister MD;  [facility];  Service: Endoscopy;  Laterality: N/A;   ERCP N/A 02/07/2022   Procedure: ENDOSCOPIC RETROGRADE  CHOLANGIOPANCREATOGRAPHY (ERCP);  Surgeon: GJackquline Denmark MD;  [facility]  Service: Gastroenterology;  Laterality: N/A;   ESOPHAGOGASTRODUODENOSCOPY     ESOPHAGOGASTRODUODENOSCOPY N/A 06/26/2016   Procedure: ESOPHAGOGASTRODUODENOSCOPY (EGD);  Surgeon: DMilus Banister MD;  [place] [facility]  Service: Endoscopy;  Laterality: N/A;   ESOPHAGOGASTRODUODENOSCOPY (EGD) WITH PROPOFOL  12/19/2013   Procedure: ESOPHAGOGASTRODUODENOSCOPY (EGD) WITH PROPOFOL;  Surgeon: PBeryle Beams MD;  [facility] [facility]  Service: Endoscopy;;   ESOPHAGOGASTRODUODENOSCOPY (EGD) WITH PROPOFOL N/A 09/10/2016   Procedure: ESOPHAGOGASTRODUODENOSCOPY (EGD) WITH PROPOFOL;  Surgeon: JMilus Banister MD;  [facility];  Service: Endoscopy;  Laterality: N/A;   EUS  06/04/2011   Procedure: UPPER ENDOSCOPIC ULTRASOUND (EUS) LINEAR;  Surgeon: DOwens Loffler MD;  [facility];  Service: Endoscopy;  Laterality: N/A;   EUS N/A 02/01/2014   Procedure: UPPER ENDOSCOPIC ULTRASOUND (EUS) LINEAR;  Surgeon: DMilus Banister MD;  [facility];  Service: Endoscopy;  Laterality: N/A;   EXPLORATORY LAPAROTOMY      biopsy of intra-abdominal mass   INTRAMEDULLARY (IM) NAIL INTERTROCHANTERIC Right 10/24/2018   Procedure: INTRAMEDULLARY (IM) NAIL INTERTROCHANTRIC;  Surgeon: AGaynelle Arabian MD;  [facility];  Service: Orthopedics;  Laterality: Right;   LAPAROTOMY N/A 11/17/2016   Procedure: EXPLORATORY LAPAROTOMY WITH LYSIS OF ADHESIONS, SMALL BOWEL RESECTION, REPAIR OF INCARCERATED VENTRAL INCISIONAL HERNIA, INSERTION OF BIOLOGIC MESH PATCH,APPLICATION OF WOUND VAC DRESSING;  Surgeon: GArmandina Gemma MD;  Location: [facility];  Service:  General;  Laterality: N/A;   MASTECTOMY W/ SENTINEL NODE BIOPSY  12/18/2011   Procedure: MASTECTOMY WITH SENTINEL LYMPH NODE BIOPSY;  Surgeon: Rolm Bookbinder, MD;  Location: [facility];  Service: General;  Laterality: Left;   PORT-A-CATH REMOVAL Right 06/15/2013    Procedure: REMOVAL PORT-A-CATH;  Surgeon: Rolm Bookbinder, MD;  Location: [facility];  Service: General;  Laterality: Right;   PORTACATH PLACEMENT  01/27/2012   Procedure: INSERTION PORT-A-CATH;  Surgeon: Rolm Bookbinder, MD;  Location: [facility];  Service: General;  Laterality: Right;  PORT PLACEMENT   REMOVAL OF STONES  02/07/2022   Procedure: REMOVAL OF STONES;  Surgeon: Jackquline Denmark, MD;  [facility];  Service: Gastroenterology;;   RIGHT/LEFT HEART CATH AND CORONARY ANGIOGRAPHY N/A 05/08/2021   Procedure: RIGHT/LEFT HEART CATH AND CORONARY ANGIOGRAPHY;  Surgeon: Jettie Booze, MD;  Location: [facility];  Service: Cardiovascular;  Laterality: N/A;   SPHINCTEROTOMY  02/07/2022   Procedure: SPHINCTEROTOMY;  Surgeon: Jackquline Denmark, MD;  [facility];  Service: Gastroenterology;;   TOTAL HIP ARTHROPLASTY Right 11/14/2018   Procedure: REMOVAL OF INTRAMEDULLARY NAIL AND POSTERIOR TOTAL HIP ARTHROPLASTY;  Surgeon: Gaynelle Arabian, MD;  Location[facility];  Service: Orthopedics;  Laterality: Right;   TOTAL SHOULDER REPLACEMENT  2011   left   TUBAL LIGATION     No current facility-administered medications for this encounter.   Facility-Administered Medications Ordered in Other Encounters  Medication Dose Route Frequency Provider Last Rate Last Admin   sodium chloride 0.9 % injection 10 mL  10 mL Intracatheter PRN Marcy Panning, MD   10 mL at 03/14/12 1628   No current facility-administered medications for this encounter.  Facility-Administered Medications Ordered in Other Encounters:    sodium chloride 0.9 % injection 10 mL, 10 mL, Intracatheter, PRN, Marcy Panning, MD, 10 mL at 03/14/12 1628 No Known Allergies Family History  Problem Relation Age of Onset   Prostate cancer Father    Breast cancer Other    Social History   Socioeconomic History   Marital status: Widowed    Spouse name: Not on file   Number of children: Not on  file   Years of education: Not on file   Highest education level: Not on file  Occupational History   Occupation: retired  Tobacco Use   Smoking status: Never   Smokeless tobacco: Never  Vaping Use   Vaping Use: Never used  Substance and Sexual Activity   Alcohol use: No   Drug use: No   Sexual activity: Yes    Birth control/protection: Surgical    Comment: mensus age 52, 56st pregnancy 69, no hrt gg4,p3, 1 babay lived a few hours complications  Other Topics Concern   Not on file  Social History Narrative   Not on file   Social Determinants of Health   Financial Resource Strain: Not on file  Food Insecurity: Unknown (10/29/2018)   Hunger Vital Sign    Worried About Running Out of Food in the Last Year: Patient refused    Ran Out of Food in the Last Year: Patient refused  Transportation Needs: Not on file  Physical Activity: Unknown (10/29/2018)   Exercise Vital Sign    Days of Exercise per Week: Patient refused    Minutes of Exercise per Session: Patient refused  Stress: Not on file  Social Connections: Unknown (10/29/2018)   Social Connection and Isolation Panel [NHANES]    Frequency of Communication with Friends and Family: Patient refused  Frequency of Social Gatherings with Friends and Family: Patient refused    Attends Religious Services: Patient refused    Active Member of Clubs or Organizations: Patient refused    Attends Archivist Meetings: Patient refused    Marital Status: Patient refused  Intimate Partner Violence: Unknown (10/29/2018)   Humiliation, Afraid, Rape, and Kick questionnaire    Fear of Current or Ex-Partner: Patient refused    Emotionally Abused: Patient refused    Physically Abused: Patient refused    Sexually Abused: Patient refused    Physical Exam: There were no vitals filed for this visit. There is no height or weight on file to calculate BMI. GEN: NAD EYE: Sclerae anicteric ENT: MMM CV: Non-tachycardic GI: Soft,  NT/ND NEURO:  Alert & Oriented x 3  Lab Results: No results for input(s): "WBC", "HGB", "HCT", "PLT" in the last 72 hours. BMET No results for input(s): "NA", "K", "CL", "CO2", "GLUCOSE", "BUN", "CREATININE", "CALCIUM" in the last 72 hours. LFT No results for input(s): "PROT", "ALBUMIN", "AST", "ALT", "ALKPHOS", "BILITOT", "BILIDIR", "IBILI" in the last 72 hours. PT/INR No results for input(s): "LABPROT", "INR" in the last 72 hours.   Impression / Plan: This is a 88 y.o.female who presents for ERCP for biliary stent exchange/removal in setting of previous choledocholithiasis status post removal by Dr. Lyndel Safe earlier this year.  The risks of an ERCP were discussed at length, including but not limited to the risk of perforation, bleeding, abdominal pain, post-ERCP pancreatitis (while usually mild can be severe and even life threatening).   The risks and benefits of endoscopic evaluation/treatment were discussed with the patient and/or family; these include but are not limited to the risk of perforation, infection, bleeding, missed lesions, lack of diagnosis, severe illness requiring hospitalization, as well as anesthesia and sedation related illnesses.  The patient's history has been reviewed, patient examined, no change in status, and deemed stable for procedure.  The patient and/or family is agreeable to proceed.    Justice Britain, MD [facility] Office # [phone]

## 2022-03-19 NOTE — Op Note (Signed)
[facility] Patient Name: Brandy [name] Procedure Date: 03/19/2022 MRN: [id number] Attending MD: Justice Britain , MD Date of Birth: 02/07/1933 CSN: [id number] Age: 88 Admit Type: Outpatient Procedure:                ERCP Indications:              Bile duct stone(s), Abnormal MRCP, Stent removal Providers:                Justice Britain, MD, Jeanella Cara, RN,                            Cletis Athens, Technician Referring MD:             Jackquline Denmark, MD, Kathyrn Lass, Rolm Bookbinder Medicines:                Monitored Anesthesia Care Complications:            No immediate complications. Estimated Blood Loss:     Estimated blood loss was minimal. Procedure:                Pre-Anesthesia Assessment:                           - Prior to the procedure, a History and Physical                            was performed, and patient medications and                            allergies were reviewed. The patient's tolerance of                            previous anesthesia was also reviewed. The risks                            and benefits of the procedure and the sedation                            options and risks were discussed with the patient.                            All questions were answered, and informed consent                            was obtained. Prior Anticoagulants: The patient has                            taken no previous anticoagulant or antiplatelet                            agents. ASA Grade Assessment: III - A patient with                            severe systemic disease. After reviewing the risks  and benefits, the patient was deemed in                            satisfactory condition to undergo the procedure.                           After obtaining informed consent, the scope was                            passed under direct vision. Throughout the                            procedure, the patient's  blood pressure, pulse, and                            oxygen saturations were monitored continuously. The                            [employer] single use                            duodenoscope was introduced through the mouth, and                            used to inject contrast into and used for direct                            visualization of the bile duct. The ERCP was                            somewhat difficult. Successful completion of the                            procedure was aided by performing the maneuvers                            documented (below) in this report. The patient                            tolerated the procedure. Unfortunately due to a                            error in [place] recording, the entire ERCP                            images endoscopically and spyglass, were not saved                            to the system. Scope In: Scope Out: Findings:      A biliary stent was visible on the scout film.      The esophagus was successfully intubated under direct vision without       detailed examination of the pharynx, larynx, and associated structures,  and upper GI tract. The patient had a significant J-shaped stomach. It       was very difficult to get the duodenoscope to intubate the duodenum (we       had to place the patient in extreme left lateral positioning with a       significant mount of the loop of the duodena scope) A biliary       sphincterotomy had been performed. The sphincterotomy appeared open. One       plastic biliary stent originating in the biliary tree was emerging from       the major papilla. One stent was removed from the biliary tree using a       snare.      A short 0.035 inch Soft Jagwire was passed into the biliary tree. The       Hydratome sphincterotome was passed over the guidewire and the bile duct       was then deeply cannulated. Contrast was injected. I personally       interpreted the  bile duct images. Ductal flow of contrast was adequate.       Image quality was adequate. Contrast extended to the hepatic ducts.       Opacification of the entire biliary tree except for the cystic duct and       gallbladder was successful. The main bile duct was severely dilated. The       largest diameter was 16 mm. The middle third of the main bile duct       contained small filling defects thought to be small stones and/or       sludge. Due to the discrepancy of the distal duct to the ampullary       region, a DASE sphincteroplasty dilation of the common bile duct with an       02-11-09 mm balloon (to a maximum balloon size of 10 mm) dilator was       successful for 3 minutes. To discover objects, the biliary tree was       swept with a retrieval balloon starting distally and eventually getting       to the bifurcation. Sludge was swept from the duct. A few stone       fragments were removed. There appeared to be a potential filling defect       in the right hepatic duct system. We transitioned the short 0.035 inch       Soft Jagwire into the right biliary system. To discover objects, the       biliary tree was swept with a retrieval balloon starting at the right       main hepatic duct. Nothing was found. An occlusion cholangiogram was       performed that showed no other issues other than the potential concern       of a nonmoving filling defect in the right hepatic duct. Because I       remain concerned about the potential of a filling defect in that region,       we transition to the spy scope and the bile duct was explored       endoscopically using the SpyGlass direct visualization system. The       SpyScope was advanced with some difficulty to the bifurcation.       Visibility with the scope was fair. The main bile duct was dilated. I       did not visualize any remaining stone/debris  within the ductal system.      A pancreatogram was not performed.      The duodenoscope was  withdrawn from the patient.      Due to decreasing visualization of the Ex[place] duodenoscope, I wanted to       ensure how the GI tract looked okay. A standard       esophagogastroduodenoscopy scope was used for the examination of the       upper gastrointestinal tract. The scope was passed under direct vision       through the upper GI tract. A non-bleeding superficial mucosal tear was       found in the proximal esophagus just below the UES (likely from scope       passage). No other gross lesions were noted in the entire esophagus. The       Z-line was irregular and was found 33 cm from the incisors. A 2 cm       hiatal hernia was present. Localized moderately congested mucosa was       found in the gastric body and in the gastric antrum with no other       abnormalities in the rest of the stomach (likely more result of scope       passage and J-shaped stomach). The gastric mucosa was biopsied for HP       evaluation. No gross lesions were noted in the duodenal bulb. The       endoscope was withdrawn from the patient. 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                           - Prior biliary sphincterotomy appeared open.                           - One stent from the biliary tree was seen in the                            major papilla. This was removed.                           - Small filling defects consistent with stone                            and/or sludge and/or air were seen on the                            cholangiogram.                           - The entire main bile duct was severely dilated.                           - Choledocholithiasis was found.  DASE                            sphincteroplasty was performed. Balloon trawl/sweep                            did remove significant stone burden.                           - Concern as to whether a  nonmoving filling defect                            in the right hepatic system was present that could                            not be moved after balloon sweep. As a result, this                            necessitated Spyscope evaluation.                           - Although the Spyscope evaluation was only fair                            and visualization, I did not note any                            further/remaining stone/debris within the biliary                            tree. Cystic duct was visualized in the middle CBD.                           - The gallbladder did not fill at any time point                            during the procedure.                           EGD Impression:                           - Superficial mucosal tear in the proximal                            esophagus just distal to UES (likely from scope                            passage). No other gross lesions in esophagus.                           - Z-line irregular, 33 cm from the incisors.                           - 2 cm hiatal  hernia.                           - Congestive gastropathy?likely from passage of                            scope as a result of the J-shaped stomach. Went                            ahead and biopsied for HP evaluation.                           - No gross lesions in the duodenal bulb. Moderate Sedation:      Not Applicable - Patient had care per Anesthesia. Recommendation:           - The patient will be observed post-procedure,                            until all discharge criteria are met.                           - Discharge patient to home.                           - Patient has a contact number available for                            emergencies. The signs and symptoms of potential                            delayed complications were discussed with the                            patient. Return to normal activities tomorrow.                            Written  discharge instructions were provided to the                            patient.                           - Low fat diet for 1 week.                           - Observe patient's clinical course.                           - Check liver enzymes (AST, ALT, alkaline                            phosphatase, bilirubin) in 2 weeks.                           - Watch for pancreatitis, bleeding, perforation,  and cholangitis.                           - Watch for pancreatitis, bleeding, perforation,                            and cholangitis.                           - Please use Cepacol or Halls Lozenges +/-                            Chloraseptic spray for next 72-96 hours to aid in                            sore thoat should you experience this.                           - Transition to omeprazole 40 mg daily                            (prescription sent to pharmacy).                           - The findings and recommendations were discussed                            with the patient.                           - The findings and recommendations were discussed                            with the designated responsible adult. Procedure Code(s):        --- Professional ---                           [phone], Endoscopic retrograde                            cholangiopancreatography (ERCP); with removal of                            foreign body(s) or stent(s) from biliary/pancreatic                            duct(s)                           43264, Endoscopic retrograde                            cholangiopancreatography (ERCP); with removal of                            calculi/debris from biliary/pancreatic duct(s)  43273, Endoscopic cannulation of papilla with                            direct visualization of pancreatic/common bile                            duct(s) (List separately in addition to code(s) for                             primary procedure) Diagnosis Code(s):        --- Professional ---                           Z96.89, Presence of other specified functional                            implants                           S27.818A, Other injury of esophagus (thoracic                            part), initial encounter                           K22.8, Other specified diseases of esophagus                           K44.9, Diaphragmatic hernia without obstruction or                            gangrene                           K31.89, Other diseases of stomach and duodenum                           R93.2, Abnormal findings on diagnostic imaging of                            liver and biliary tract                           K80.50, Calculus of bile duct without cholangitis                            or cholecystitis without obstruction                           Z46.59, Encounter for fitting and adjustment of                            other gastrointestinal appliance and device                           K83.8, Other specified diseases of biliary tract CPT copyright 2019 [employer]. All rights reserved. The codes documented  in this report are preliminary and upon coder review may  be revised to meet current compliance requirements. Justice Britain, MD 03/19/2022 5:22:38 PM Number of Addenda: 0

## 2022-03-19 NOTE — Anesthesia Postprocedure Evaluation (Signed)
Anesthesia Post Note  Patient: Brandy [name]  Procedure(s) Performed: ENDOSCOPIC RETROGRADE CHOLANGIOPANCREATOGRAPHY (ERCP) WITH PROPOFOL STENT REMOVAL BILIARY DILATION REMOVAL OF STONES SPYGLASS CHOLANGIOSCOPY BIOPSY     Patient location during evaluation: PACU Anesthesia Type: General Level of consciousness: awake and alert, oriented and patient cooperative Pain management: pain level controlled Vital Signs Assessment: post-procedure vital signs reviewed and stable Respiratory status: spontaneous breathing, nonlabored ventilation and respiratory function stable Cardiovascular status: blood pressure returned to baseline and stable Postop Assessment: no apparent nausea or vomiting Anesthetic complications: no   No notable events documented.  Last Vitals:  Vitals:   03/19/22 1715 03/19/22 1730  BP: 137/63 (!) 135/55  Pulse: 81 78  Resp: 18 18  Temp:    SpO2: 92% 92%    Last Pain:  Vitals:   03/19/22 1707  TempSrc: Oral  PainSc: 0-No pain                 Pervis Hocking

## 2022-03-19 NOTE — Anesthesia Procedure Notes (Signed)
Procedure Name: Intubation Date/Time: 03/19/2022 3:45 PM  Performed by: Jonna Munro, CRNAPre-anesthesia Checklist: Patient identified, Emergency Drugs available, Suction available, Patient being monitored and Timeout performed Patient Re-evaluated:Patient Re-evaluated prior to induction Oxygen Delivery Method: Circle system utilized Preoxygenation: Pre-oxygenation with 100% oxygen Induction Type: IV induction and Rapid sequence Laryngoscope Size: Mac and 3 Grade View: Grade I Tube type: Oral Tube size: 7.0 mm Number of attempts: 1 Airway Equipment and Method: Stylet Placement Confirmation: ETT inserted through vocal cords under direct vision, positive ETCO2, CO2 detector and breath sounds checked- equal and bilateral Secured at: 22 cm Tube secured with: Tape Dental Injury: Teeth and Oropharynx as per pre-operative assessment

## 2022-03-22 ENCOUNTER — Encounter (HOSPITAL_COMMUNITY): Payer: Self-pay | Admitting: Gastroenterology

## 2022-03-23 ENCOUNTER — Encounter: Payer: Self-pay | Admitting: Gastroenterology

## 2022-03-23 LAB — SURGICAL PATHOLOGY

## 2022-03-24 ENCOUNTER — Telehealth: Payer: Self-pay

## 2022-03-24 DIAGNOSIS — K8050 Calculus of bile duct without cholangitis or cholecystitis without obstruction: Secondary | ICD-10-CM

## 2022-03-24 NOTE — Telephone Encounter (Signed)
2-week lab reminder sent to Dr. Steve Rattler nurse - Remo Lipps, RN. Lab order in epic. Patient has been scheduled for a f/u with Dr Lyndel Safe on Tuesday, 05/26/22 at 11 am. Appt information mailed with path results.

## 2022-03-24 NOTE — Telephone Encounter (Signed)
Mansouraty, Telford Nab., MD  Timothy Lasso, RN Send letter when able.  Hepatic function panel in 2 weeks under Dr. Steve Rattler name.  Please set up with follow-up for Dr. Lyndel Safe in 6 to 10 weeks.  Thanks.

## 2022-03-26 ENCOUNTER — Emergency Department (HOSPITAL_COMMUNITY): Payer: Medicare HMO

## 2022-03-26 ENCOUNTER — Observation Stay (HOSPITAL_COMMUNITY)
Admission: EM | Admit: 2022-03-26 | Discharge: 2022-03-30 | Disposition: A | Discharge status: Skilled Nursing Facility | Payer: Medicare HMO | Attending: Internal Medicine | Admitting: Internal Medicine

## 2022-03-26 ENCOUNTER — Other Ambulatory Visit: Payer: Self-pay

## 2022-03-26 ENCOUNTER — Encounter (HOSPITAL_COMMUNITY): Payer: Self-pay | Admitting: Pharmacy Technician

## 2022-03-26 DIAGNOSIS — S79911A Unspecified injury of right hip, initial encounter: Secondary | ICD-10-CM | POA: Diagnosis not present

## 2022-03-26 DIAGNOSIS — Y92009 Unspecified place in unspecified non-institutional (private) residence as the place of occurrence of the external cause: Secondary | ICD-10-CM

## 2022-03-26 DIAGNOSIS — I10 Essential (primary) hypertension: Secondary | ICD-10-CM

## 2022-03-26 DIAGNOSIS — S3210XA Unspecified fracture of sacrum, initial encounter for closed fracture: Secondary | ICD-10-CM | POA: Diagnosis not present

## 2022-03-26 DIAGNOSIS — K219 Gastro-esophageal reflux disease without esophagitis: Secondary | ICD-10-CM

## 2022-03-26 DIAGNOSIS — W010XXA Fall on same level from slipping, tripping and stumbling without subsequent striking against object, initial encounter: Secondary | ICD-10-CM | POA: Diagnosis not present

## 2022-03-26 DIAGNOSIS — E860 Dehydration: Secondary | ICD-10-CM | POA: Diagnosis not present

## 2022-03-26 DIAGNOSIS — Y92129 Unspecified place in nursing home as the place of occurrence of the external cause: Secondary | ICD-10-CM | POA: Diagnosis not present

## 2022-03-26 DIAGNOSIS — D509 Iron deficiency anemia, unspecified: Secondary | ICD-10-CM | POA: Diagnosis not present

## 2022-03-26 DIAGNOSIS — S32591A Other specified fracture of right pubis, initial encounter for closed fracture: Secondary | ICD-10-CM

## 2022-03-26 DIAGNOSIS — Z853 Personal history of malignant neoplasm of breast: Secondary | ICD-10-CM | POA: Insufficient documentation

## 2022-03-26 DIAGNOSIS — Z79899 Other long term (current) drug therapy: Secondary | ICD-10-CM | POA: Diagnosis not present

## 2022-03-26 DIAGNOSIS — S32501A Unspecified fracture of right pubis, initial encounter for closed fracture: Secondary | ICD-10-CM | POA: Diagnosis not present

## 2022-03-26 DIAGNOSIS — Z96641 Presence of right artificial hip joint: Secondary | ICD-10-CM | POA: Insufficient documentation

## 2022-03-26 DIAGNOSIS — Z96612 Presence of left artificial shoulder joint: Secondary | ICD-10-CM | POA: Diagnosis not present

## 2022-03-26 DIAGNOSIS — F32A Depression, unspecified: Secondary | ICD-10-CM | POA: Diagnosis not present

## 2022-03-26 DIAGNOSIS — W19XXXA Unspecified fall, initial encounter: Secondary | ICD-10-CM | POA: Diagnosis not present

## 2022-03-26 DIAGNOSIS — S3219XA Other fracture of sacrum, initial encounter for closed fracture: Principal | ICD-10-CM | POA: Insufficient documentation

## 2022-03-26 DIAGNOSIS — J45909 Unspecified asthma, uncomplicated: Secondary | ICD-10-CM | POA: Diagnosis not present

## 2022-03-26 DIAGNOSIS — M8588 Other specified disorders of bone density and structure, other site: Secondary | ICD-10-CM | POA: Diagnosis not present

## 2022-03-26 DIAGNOSIS — S32511A Fracture of superior rim of right pubis, initial encounter for closed fracture: Secondary | ICD-10-CM | POA: Diagnosis not present

## 2022-03-26 DIAGNOSIS — M25551 Pain in right hip: Secondary | ICD-10-CM | POA: Diagnosis present

## 2022-03-26 DIAGNOSIS — S32599A Other specified fracture of unspecified pubis, initial encounter for closed fracture: Secondary | ICD-10-CM | POA: Diagnosis present

## 2022-03-26 DIAGNOSIS — S32811A Multiple fractures of pelvis with unstable disruption of pelvic ring, initial encounter for closed fracture: Secondary | ICD-10-CM | POA: Diagnosis not present

## 2022-03-26 LAB — BASIC METABOLIC PANEL
Anion gap: 5 (ref 5–15)
BUN: 28 mg/dL — ABNORMAL HIGH (ref 8–23)
CO2: 21 mmol/L — ABNORMAL LOW (ref 22–32)
Calcium: 9.1 mg/dL (ref 8.9–10.3)
Chloride: 115 mmol/L — ABNORMAL HIGH (ref 98–111)
Creatinine, Ser: 1.08 mg/dL — ABNORMAL HIGH (ref 0.44–1.00)
GFR, Estimated: 50 mL/min — ABNORMAL LOW (ref >60)
Glucose, Bld: 111 mg/dL — ABNORMAL HIGH (ref 70–99)
Potassium: 4.2 mmol/L (ref 3.5–5.1)
Sodium: 141 mmol/L (ref 135–145)

## 2022-03-26 LAB — CBC
HCT: 26.7 % — ABNORMAL LOW (ref 36.0–46.0)
Hemoglobin: 8.4 g/dL — ABNORMAL LOW (ref 12.0–15.0)
MCH: 29.9 pg (ref 26.0–34.0)
MCHC: 31.5 g/dL (ref 30.0–36.0)
MCV: 95 fL (ref 80.0–100.0)
Platelets: 144 10*3/uL — ABNORMAL LOW (ref 150–400)
RBC: 2.81 MIL/uL — ABNORMAL LOW (ref 3.87–5.11)
RDW: 15.5 % (ref 11.5–15.5)
WBC: 7.6 10*3/uL (ref 4.0–10.5)
nRBC: 0 % (ref 0.0–0.2)

## 2022-03-26 LAB — MAGNESIUM: Magnesium: 2 mg/dL (ref 1.7–2.4)

## 2022-03-26 MED ORDER — ACETAMINOPHEN 650 MG RE SUPP: 650.0000 mg | Freq: Four times a day (QID) | RECTAL | Status: DC | PRN

## 2022-03-26 MED ORDER — FENTANYL CITRATE PF 50 MCG/ML IJ SOSY: 25.0000 ug | PREFILLED_SYRINGE | INTRAMUSCULAR | Status: DC | PRN

## 2022-03-26 MED ORDER — HYDROCODONE-ACETAMINOPHEN 5-325 MG PO TABS
1.0000 | ORAL_TABLET | Freq: Once | ORAL | Status: AC
  Administered 2022-03-26: 1 via ORAL
  Filled 2022-03-26: qty 1

## 2022-03-26 MED ORDER — NALOXONE HCL 0.4 MG/ML IJ SOLN: 0.4000 mg | INTRAMUSCULAR | Status: DC | PRN

## 2022-03-26 MED ORDER — ONDANSETRON HCL 4 MG/2ML IJ SOLN
4.0000 mg | Freq: Four times a day (QID) | INTRAMUSCULAR | Status: DC | PRN
  Administered 2022-03-26: 4 mg via INTRAVENOUS
  Filled 2022-03-26: qty 2

## 2022-03-26 MED ORDER — ACETAMINOPHEN 325 MG PO TABS
650.0000 mg | ORAL_TABLET | Freq: Four times a day (QID) | ORAL | Status: DC | PRN
  Administered 2022-03-27: 650 mg via ORAL
  Filled 2022-03-26: qty 2

## 2022-03-26 NOTE — H&P (Signed)
History and Physical    PLEASE NOTE THAT DRAGON DICTATION SOFTWARE WAS USED IN THE CONSTRUCTION OF THIS NOTE.   Brandy [name] ATF:[id number] DOB: 04-26-1933 DOA: 03/26/2022  PCP: Kathyrn Lass, MD  Patient coming from: home   I have personally briefly reviewed patient's old medical records in [place]  Chief Complaint: right hip, right low back pain  HPI: Brandy [name] is a 88 y.o. female with medical history significant for essential hypertension, hyperlipidemia, chronic iron deficiency anemia with baseline hemoglobin range 8-10, who is admitted to [facility] on 03/26/2022 with acute right inferior/superior pubic rami fractures as well as acute right sacral alla fracture after presenting from SNF to [facility] ED complaining of right hip and acute right low back pain following ground-level mechanical fall.   Patient reports tripping while attempting to ambulate with her walker at SNF, which she uses for baseline ambulatory habits, resulting in a ground level fall during which he fell nearly straight backwards, slightly to the right hip and right buttocks on the floor as the principal points of contact with the floor below.  She notes that this follows purely mechanical in nature, stating that her feet got tangled with the walker causing her to trip.   As a result of this fall, the patient reports immediate development of sharp right hip pain and right low back pain. States that this pain has been constant since onset, with exacerbation when attempting to ambulate or put weight on the right lower extremity.  As a consequence of the associated intensity of this discomfort, reports that he is unable to bear weight at this time .   Otherwise, denies any acute arthralgias or myalgias as a result of the above fall.  Denies any acute numbness or paresthesias in bilateral lower extremities, and confirms that  hip representations a native joint.  The above was a witnessed witnessed fall, as  seen by SNF staff. Did not hit head as a component of this fall, and denies any associated loss of consciousness.  Denies any preceding or associated chest pain, shortness of breath, diaphoresis, palpitations, nausea, vomiting, dizziness, presyncope, or syncope.  Denies any subsequent headache, neck pain, blurry vision, or diplopia.  Not on any blood thinners as an outpatient, including no aspirin.  Denies any known history of coronary artery disease or CHF. denies any recent orthopnea, PND, or peripheral edema.       ED Course:  Vital signs in the ED were notable for the following: Afebrile; systolic blood pressures in the 120s to 130s; oxygen saturation 96% on room air.  Labs were notable for the following: BMP notable for sodium 141, BUN 20, creatinine 1.08 compared to most recent prior creatinine dated 1 and 0.92 on 02/27/2022.  CBC notable for white cell count 7600, hemoglobin 8.4 associated normocytic/normochromic properties as well as not elevated RDW, relative to most recent prior value of 8.6 on 02/08/22.  Imaging and additional notable ED work-up: Plain films of the hip/pelvis which showed mildly displaced fractures of the right inferior/superior pubic rami.  CT L-spine showed acute nondisplaced fracture of the right sacral alla, as well as x-rays of the right superior/inferior rib pubic rami.  EDP d/w on-call [facility] (Dr. Kathaleen Bury), who felt that these may actually be surgical option for he fx's. He is going to convey case onto Dr. Domenick Gong, who will formally consult and eval the patient in the AM (9/22) to determine if surgery is an option.   While in the  ED, the following were administered: Norco 5/325 mg p.o. x2 doses.  Zofran 4 mg IV x1 dose.  Subsequently, the patient was admitted for further evaluation and management of acute right inferior/superior pubic rami fracture and acute fracture of the right sacral ala.   Review of Systems: As per HPI otherwise 10 point review of  systems negative.   Past Medical History:  Diagnosis Date   Anemia    Arthritis    shoulders   Asthma    mild, no inhalers used   Breast cancer ([place]) 12/18/2011   left breast masectomy=metastatic ca in (1/1) lymph node ,invasive ductal ca,2 foci,,dcis,lymph ovascular invasion identified,surgical resection margins neg for ca,additional tissue=benign skin and subcutaneous tissue   Bronchitis    hx of;last time >67yrago   Depression    takes Celexa daily   Full dentures    Gall stones 2015   GERD (gastroesophageal reflux disease)    takes Prilosec prn   H/O hiatal hernia    History of kidney stones    many yrs ago   Hx of radiation therapy 04/26/12 -06/10/12   left breast   Hyperlipidemia    takes Simvastatin daily   Hypertension    takes Amlodipine and Diovan daily   Insomnia    takes Ambien nightly   Urinary urgency     Past Surgical History:  Procedure Laterality Date   APPENDECTOMY     BILIARY DILATION  02/07/2022   Procedure: BILIARY DILATION;  Surgeon: GJackquline Denmark MD;  [place]  Service: Gastroenterology;;   BILIARY DILATION  03/19/2022   Procedure: BILIARY DILATION;  Surgeon: MIrving Copas, MD;  [facility];  Service: Gastroenterology;;   BILIARY STENT PLACEMENT  02/07/2022   Procedure: BILIARY STENT PLACEMENT;  Surgeon: GJackquline Denmark MD;  [place]  Service: Gastroenterology;;   BIOPSY  03/19/2022   Procedure: BIOPSY;  Surgeon: MIrving Copas, MD;  [facility];  Service: Gastroenterology;;   bladder tack     BREAST BIOPSY  1998   left    DILATION AND CURETTAGE OF UTERUS     ENDOSCOPIC RETROGRADE CHOLANGIOPANCREATOGRAPHY (ERCP) WITH PROPOFOL N/A 02/01/2014   Procedure: ENDOSCOPIC RETROGRADE CHOLANGIOPANCREATOGRAPHY (ERCP) WITH PROPOFOL;  Surgeon: DMilus Banister MD;  [facility];  Service: Endoscopy;  Laterality: N/A;   ENDOSCOPIC RETROGRADE CHOLANGIOPANCREATOGRAPHY (ERCP) WITH PROPOFOL  N/A 03/19/2022   Procedure: ENDOSCOPIC RETROGRADE CHOLANGIOPANCREATOGRAPHY (ERCP) WITH PROPOFOL;  Surgeon: MRush LandmarkGTelford Nab, MD;  [facility];  Service: Gastroenterology;  Laterality: N/A;   ERCP N/A 02/07/2022   Procedure: ENDOSCOPIC RETROGRADE CHOLANGIOPANCREATOGRAPHY (ERCP);  Surgeon: GJackquline Denmark MD;  [facility]  Service: Gastroenterology;  Laterality: N/A;   ESOPHAGOGASTRODUODENOSCOPY     ESOPHAGOGASTRODUODENOSCOPY N/A 06/26/2016   Procedure: ESOPHAGOGASTRODUODENOSCOPY (EGD);  Surgeon: DMilus Banister MD;  [place]  Service: Endoscopy;  Laterality: N/A;   ESOPHAGOGASTRODUODENOSCOPY (EGD) WITH PROPOFOL  12/19/2013   Procedure: ESOPHAGOGASTRODUODENOSCOPY (EGD) WITH PROPOFOL;  Surgeon: PBeryle Beams MD;  [place]  Service: Endoscopy;;   ESOPHAGOGASTRODUODENOSCOPY (EGD) WITH PROPOFOL N/A 09/10/2016   Procedure: ESOPHAGOGASTRODUODENOSCOPY (EGD) WITH PROPOFOL;  Surgeon: JMilus Banister MD;  [facility];  Service: Endoscopy;  Laterality: N/A;   EUS  06/04/2011   Procedure: UPPER ENDOSCOPIC ULTRASOUND (EUS) LINEAR;  Surgeon: DOwens Loffler MD;  [facility];  Service: Endoscopy;  Laterality: N/A;   EUS N/A 02/01/2014   Procedure: UPPER ENDOSCOPIC ULTRASOUND (EUS) LINEAR;  Surgeon: DMilus Banister MD;  [facility];  Service: Endoscopy;  Laterality: N/A;   EXPLORATORY LAPAROTOMY      biopsy of intra-abdominal mass   INTRAMEDULLARY (IM) NAIL INTERTROCHANTERIC Right 10/24/2018   Procedure: INTRAMEDULLARY (IM) NAIL INTERTROCHANTRIC;  Surgeon: Gaynelle Arabian, MD;  [facility];  Service: Orthopedics;  Laterality: Right;   LAPAROTOMY N/A 11/17/2016   Procedure: EXPLORATORY LAPAROTOMY WITH LYSIS OF ADHESIONS, SMALL BOWEL RESECTION, REPAIR OF INCARCERATED VENTRAL INCISIONAL HERNIA, INSERTION OF BIOLOGIC MESH PATCH,APPLICATION OF WOUND VAC DRESSING;  Surgeon: Armandina Gemma, MD;  [facility];  Service: General;   Laterality: N/A;   MASTECTOMY W/ SENTINEL NODE BIOPSY  12/18/2011   Procedure: MASTECTOMY WITH SENTINEL LYMPH NODE BIOPSY;  Surgeon: Rolm Bookbinder, MD;  Location: [place];  Service: General;  Laterality: Left;   PORT-A-CATH REMOVAL Right 06/15/2013   Procedure: REMOVAL PORT-A-CATH;  Surgeon: Rolm Bookbinder, MD;  Location: [place];  Service: General;  Laterality: Right;   PORTACATH PLACEMENT  01/27/2012   Procedure: INSERTION PORT-A-CATH;  Surgeon: Rolm Bookbinder, MD;  Location: [place];  Service: General;  Laterality: Right;  PORT PLACEMENT   REMOVAL OF STONES  02/07/2022   Procedure: REMOVAL OF STONES;  Surgeon: Jackquline Denmark, MD;  Location: [place];  Service: Gastroenterology;;   REMOVAL OF STONES  03/19/2022   Procedure: REMOVAL OF STONES;  Surgeon: Irving Copas., MD;  [facility];  Service: Gastroenterology;;   RIGHT/LEFT HEART CATH AND CORONARY ANGIOGRAPHY N/A 05/08/2021   Procedure: RIGHT/LEFT HEART CATH AND CORONARY ANGIOGRAPHY;  Surgeon: Jettie Booze, MD;  Location: [place] [facility];  Service: Cardiovascular;  Laterality: N/A;   SPHINCTEROTOMY  02/07/2022   Procedure: SPHINCTEROTOMY;  Surgeon: Jackquline Denmark, MD;  [facility];  Service: Gastroenterology;;   Bess Kinds CHOLANGIOSCOPY N/A 03/19/2022   Procedure: XQJJHERD CHOLANGIOSCOPY;  Surgeon: Irving Copas., MD;  [facility];  Service: Gastroenterology;  Laterality: N/A;   STENT REMOVAL  03/19/2022   Procedure: STENT REMOVAL;  Surgeon: Irving Copas., MD;  [facility];  Service: Gastroenterology;;   TOTAL HIP ARTHROPLASTY Right 11/14/2018   Procedure: REMOVAL OF INTRAMEDULLARY NAIL AND POSTERIOR TOTAL HIP ARTHROPLASTY;  Surgeon: Gaynelle Arabian, MD;  [facility];  Service: Orthopedics;  Laterality: Right;   TOTAL SHOULDER REPLACEMENT  2011   left   TUBAL LIGATION      Social History:  reports that she has never  smoked. She has never used smokeless tobacco. She reports that she does not drink alcohol and does not use drugs.   No Known Allergies  Family History  Problem Relation Age of Onset   Prostate cancer Father    Breast cancer Other     Family history reviewed and not pertinent    Prior to Admission medications   Medication Sig Start Date End Date Taking? Authorizing Provider  acetaminophen (TYLENOL) 500 MG tablet Take 1,000 mg by mouth every 6 (six) hours as needed for headache (pain).   Yes [provider]  alendronate (FOSAMAX) 70 MG tablet Take 70 mg by mouth every Sunday. 04/01/21  Yes [provider]  atorvastatin (LIPITOR) 40 MG tablet Take 40 mg by mouth at bedtime. 02/06/22  Yes [provider]  Cyanocobalamin (VITAMIN B-12 PO) Take 1 tablet by mouth every evening.   Yes [provider]  DULoxetine (CYMBALTA) 20 MG capsule Take 40 mg by mouth in the morning. 04/17/21  Yes [provider]  ferrous sulfate 325 (65 FE) MG tablet Take 1 tablet (325 mg  total) by mouth daily. 05/09/21  Yes Jennye Boroughs, MD  gabapentin (NEURONTIN) 300 MG capsule Take 2 capsules (600 mg total) by mouth at bedtime. 01/16/22  Yes Nicholas Lose, MD  Multiple Vitamin (MULTIVITAMIN WITH MINERALS) TABS tablet Take 1 tablet by mouth in the morning.   Yes [provider]  omeprazole (PRILOSEC) 40 MG capsule Take 1 capsule (40 mg total) by mouth daily before breakfast. 03/19/22  Yes Mansouraty, Telford Nab., MD  rOPINIRole (REQUIP) 0.25 MG tablet Take 0.25 mg by mouth at bedtime. 07/28/21  Yes [provider]  traMADol (ULTRAM) 50 MG tablet Take 2 tablets (100 mg total) by mouth 4 (four) times daily. Patient taking differently: Take 100 mg by mouth 4 (four) times daily as needed (pain.). Take 1 tablet (50 mg) by mouth (scheduled) twice daily & may take 2 additional doses if needed for pain. 11/16/18  Yes Edmisten, Kristie L, PA  valsartan-hydrochlorothiazide  (DIOVAN-HCT) 80-12.5 MG tablet Take 1 tablet by mouth in the morning.   Yes [provider]  Vitamin D, Ergocalciferol, (DRISDOL) 50000 units CAPS capsule Take 50,000 Units by mouth every Sunday.   Yes [provider]  metoprolol tartrate (LOPRESSOR) 100 MG tablet Take as directed the morning of 9/11 CT scan Patient not taking: Reported on 03/26/2022 03/02/22   Early Osmond, MD     Objective    Physical Exam: Vitals:   03/26/22 2006 03/26/22 2130 03/26/22 2200 03/26/22 2232  BP: 134/62 (!) 125/54 (!) 142/62 (!) 153/81  Pulse: 86 81 85 79  Resp: (!) '21 16 17 15  '$ Temp:    98.6 F (37 C)  TempSrc:    Oral  SpO2: 94% 92% 93% 95%    General: appears to be stated age; alert, oriented Skin: warm, dry, no rash Head:  AT/ Mouth:  Oral mucosa membranes appear dry, normal dentition Neck: supple; trachea midline Heart:  RRR; did not appreciate any M/R/G Lungs: CTAB, did not appreciate any wheezes, rales, or rhonchi Abdomen: + BS; soft, ND, NT Vascular: 2+ pedal pulses b/l; 2+ radial pulses b/l Extremities: no peripheral edema, no muscle wasting Neuro: sensation intact in upper and lower extremities b/l; deferred evaluation of strength of the lower extremities in the context of multiple presenting acute fractures, as outlined above      Labs on Admission: I have personally reviewed following labs and imaging studies  CBC: Recent Labs  Lab 03/26/22 2147  WBC 7.6  HGB 8.4*  HCT 26.7*  MCV 95.0  PLT 527*   Basic Metabolic Panel: Recent Labs  Lab 03/26/22 2147  NA 141  K 4.2  CL 115*  CO2 21*  GLUCOSE 111*  BUN 28*  CREATININE 1.08*  CALCIUM 9.1   GFR: Estimated Creatinine Clearance: 29.2 mL/min (A) (by C-G formula based on SCr of 1.08 mg/dL (H)). Liver Function Tests: No results for input(s): "AST", "ALT", "ALKPHOS", "BILITOT", "PROT", "ALBUMIN" in the last 168 hours. No results for input(s): "LIPASE", "AMYLASE" in the last 168 hours. No  results for input(s): "AMMONIA" in the last 168 hours. Coagulation Profile: No results for input(s): "INR", "PROTIME" in the last 168 hours. Cardiac Enzymes: No results for input(s): "CKTOTAL", "CKMB", "CKMBINDEX", "TROPONINI" in the last 168 hours. BNP (last 3 results) No results for input(s): "PROBNP" in the last 8760 hours. HbA1C: No results for input(s): "HGBA1C" in the last 72 hours. CBG: No results for input(s): "GLUCAP" in the last 168 hours. Lipid Profile: No results for input(s): "CHOL", "HDL", "LDLCALC", "  TRIG", "CHOLHDL", "LDLDIRECT" in the last 72 hours. Thyroid Function Tests: No results for input(s): "TSH", "T4TOTAL", "FREET4", "T3FREE", "THYROIDAB" in the last 72 hours. Anemia Panel: No results for input(s): "VITAMINB12", "FOLATE", "FERRITIN", "TIBC", "IRON", "RETICCTPCT" in the last 72 hours. Urine analysis:    Component Value Date/Time   COLORURINE YELLOW 02/07/2022 0508   APPEARANCEUR CLEAR 02/07/2022 0508   LABSPEC 1.024 02/07/2022 0508   PHURINE 6.0 02/07/2022 0508   GLUCOSEU NEGATIVE 02/07/2022 0508   HGBUR SMALL (A) 02/07/2022 0508   BILIRUBINUR NEGATIVE 02/07/2022 0508   KETONESUR NEGATIVE 02/07/2022 0508   PROTEINUR NEGATIVE 02/07/2022 0508   UROBILINOGEN 1.0 10/10/2009 1100   NITRITE NEGATIVE 02/07/2022 0508   LEUKOCYTESUR NEGATIVE 02/07/2022 0508    Radiological Exams on Admission: CT L-SPINE NO CHARGE  Result Date: 03/26/2022 CLINICAL DATA:  Initial evaluation for acute trauma, fall. EXAM: CT LUMBAR SPINE WITHOUT CONTRAST TECHNIQUE: Multidetector CT imaging of the lumbar spine was performed without intravenous contrast administration. Multiplanar CT image reconstructions were also generated. RADIATION DOSE REDUCTION: This exam was performed according to the departmental dose-optimization program which includes automated exposure control, adjustment of the mA and/or kV according to patient size and/or use of iterative reconstruction technique.  COMPARISON:  Prior CTs from earlier the same day. FINDINGS: Segmentation: Standard. Lowest well-formed disc space labeled the L5-S1 level. Alignment: Trace retrolisthesis of T12 on L1 and L1 on L2, likely chronic and degenerative. Alignment otherwise normal preservation of the normal lumbar lordosis. Vertebrae: Acute nondisplaced fracture of the right sacral ala. No visible extension through the sacral foramina. Acute fractures of the right superior and inferior pubic rami noted. No acute vertebral body fracture. Chronic height loss noted at T10. Underlying osteopenia. No worrisome osseous lesions. Paraspinal and other soft tissues: Paraspinous soft tissues better evaluated on corresponding CT of the abdomen and pelvis. No visible abnormality. Aortic atherosclerosis. Right total hip arthroplasty partially visualized. Disc levels: Underlying mild for age multilevel degenerative spondylosis, most pronounced at L4-5 and L5-S1. No high-grade spinal stenosis by CT. IMPRESSION: 1. Acute nondisplaced fracture of the right sacral ala. 2. Acute fractures of the right superior and inferior pubic rami. 3. No other acute traumatic injury within the lumbar spine. Aortic Atherosclerosis (ICD10-I70.0). Electronically Signed   By: Jeannine Boga M.D.   On: 03/26/2022 20:14   CT ABDOMEN PELVIS WO CONTRAST  Result Date: 03/26/2022 CLINICAL DATA:  Pelvis fracture.  Mechanical fall yesterday. EXAM: CT ABDOMEN AND PELVIS WITHOUT CONTRAST TECHNIQUE: Multidetector CT imaging of the abdomen and pelvis was performed following the standard protocol without IV contrast. RADIATION DOSE REDUCTION: This exam was performed according to the departmental dose-optimization program which includes automated exposure control, adjustment of the mA and/or kV according to patient size and/or use of iterative reconstruction technique. COMPARISON:  Abdominopelvic CT a 03/16/2022 FINDINGS: Lower chest: Similar pleural thickening to recent prior. No  acute basilar airspace disease. The heart is normal in size. Hepatobiliary: Interval removal of biliary stent. Increased pneumobilia likely related to recent ERCP. Grossly stable biliary prominence on this unenhanced exam. Air in the gallbladder again seen. No pericholecystic inflammation. Homogeneous hepatic attenuation without acute findings. Pancreas: Parenchymal atrophy. No ductal dilatation or inflammation. Spleen: Homogeneous attenuation, normal in size. No evidence of injury on this unenhanced exam. Adrenals/Urinary Tract: Normal adrenal glands. Bilateral renal parenchymal thinning. Bilateral renal cysts including left kidney peripelvic cysts. No specific imaging follow-up is recommended. No hydronephrosis. Unremarkable urinary bladder. Stomach/Bowel: Small hiatal hernia. No bowel obstruction or inflammation. Small bowel enteric sutures  in the central abdomen with an adjacent patulous loop of small bowel. Moderate volume of colonic stool. Appendix is not definitively seen. Vascular/Lymphatic: Aortic atherosclerosis without aneurysm. No adenopathy. Reproductive: Normal for age uterine atrophy.  No adnexal mass. Other: No ascites or free fluid. No free air. Postsurgical change of the anterior abdominal wall. Musculoskeletal: Lumbar spine assessed on concurrent lumbar spine reformats, reported separately. Mildly comminuted and displaced fracture of the right superior pubic ramus at the puboacetabular junction. Fracture does not extend to the right hip arthroplasty. Displaced and comminuted fracture of the right inferior pubic ramus. There is a nondisplaced right sacral fracture without definite involvement of the sacral foramen. The bones are diffusely under mineralized. Right hip arthroplasty without periprosthetic fracture. IMPRESSION: 1. Right superior and inferior pubic rami fractures, mildly comminuted and displaced. 2. Nondisplaced right sacral fracture without definite involvement of the sacral foramen.  3. Interval removal of biliary stent. Increased pneumobilia likely related to recent ERCP. Grossly stable biliary prominence on this unenhanced exam. 4. Additional stable chronic findings as described. Aortic Atherosclerosis (ICD10-I70.0). Electronically Signed   By: Keith Rake M.D.   On: 03/26/2022 19:57   DG Hip Unilat  With Pelvis 2-3 Views Right  Result Date: 03/26/2022 CLINICAL DATA:  Hip fracture. EXAM: DG HIP (WITH OR WITHOUT PELVIS) 2-3V RIGHT COMPARISON:  None Available. FINDINGS: Right total hip arthroplasty. Mildly displaced fractures of the right inferior and superior pubic rami. No hip dislocation. Calcific atherosclerosis. Lower lumbar degenerative change. Scattered enthesopathy. IMPRESSION: Mildly displaced fractures of the right inferior and superior pubic rami. Electronically Signed   By: Margaretha Sheffield M.D.   On: 03/26/2022 16:42     Assessment/Plan    Principal Problem:   Pubic ramus fracture (HCC) Active Problems:   Depression   Gastroesophageal reflux disease without esophagitis   Essential hypertension   Chronic iron deficiency anemia   Fall at home, initial encounter   Dehydration      #) Acute right inferior/superior pubic rami fracture and acute fracture of right sacral ala:  confirmed via presenting imaging, as further detailed above, and stemming from ground level mechanical fall without associated loss of consciousness that occurred earlier on the day of admission, as further described above.  At this time, the bilateral lower extremities appears neurovascularly intact, and patient reports adequate pain control. Not on any blood thinners at home, including no aspirin.   EDP d/w on-call [facility] (Dr. Kathaleen Bury), who felt that these may actually be surgical option for he fx's. He is going to convey case onto Dr. Domenick Gong, who will formally consult and eval the patient in the AM (9/22) to determine if surgery is an option.   Lyndel Safe Score for this  patient in the context of anticipated aforementioned orthopedic surgery conveys a  1.79 % perioperative risk for significant cardiac event. No evidence to suggest acutely decompensated heart failure or acute MI. Consequently, no absolute contraindications to proceeding with proposed orthopedic surgery at this time.   Plan: Formal orthopedic surgery consult, with [facility] to evaluate patient in the AM to determine if she is a candidate for surgical intervention, as above. NPO after MN in case the patient is to proceed with surgical intervention. No pharmacologic anticoagulation leading up to this anticipated surgery. SCD's. Prn IV fentanyl. Anticipate postoperative PT consult. Check INR. Pre-op EKG has been ordered.  Fall precautions ordered.         #) Ground level mechanical fall: The patient reports a ground level mechanical fall earlier today  in which she tripped without any associated loss of consciousness. Appears to be purely mechanical in nature, without clinical evidence at this time to suggest contributory dizziness, presyncope, syncope, or acute ischemic CVA. Does not appear to have hit head as component of this fall. While this fall appears to be purely mechanical in nature, will also check urinalysis to evaluate for any underlying infectious contribution.   Plan: Check urinalysis, as above.  Repeat CMP and CBC with differential in the morning. Fall precautions. Anticipate postoperative PT consult.              #) Dehydration: Clinical suspicion for such, including the appearance of dry oral mucous membranes as well as laboratory findings notable for acute prerenal azotemia.  No e/o associated hypotension.  Particular in setting of plan for n.p.o. after midnight and will provide gentle IV fluids overnight for rehydration purposes, as further detailed below.   Plan: Monitor strict I's and O's.  Daily weights.  Repeat CMP in the morning. IVF's in form of lactated Ringer's at  50 cc/h.Marland Kitchen           #) Depression: Documented history of such, on Cymbalta as an outpatient.  Plan: In the setting of current n.p.o. status, will hold home Cymbalta for now.  We will follow-up for results to preoperative EKG, including evaluation of QT interval.          #) GERD: Documented history of such, on omeprazole as an outpatient.  Plan: In the setting of current n.p.o. status, will hold home PPI for now.              #) Essential Hypertension: documented h/o such, with outpatient antihypertensive regimen including valsartan, HCTZ.  SBP's in the ED today: 120s to 130s mmHg.   Plan: Close monitoring of subsequent BP via routine VS. in the setting of current n.p.o. status, hold home antihypertensive medications for now.            #) Hyperlipidemia: documented h/o such. On high intensity atorvastatin as outpatient.    Plan: Hold home statin in setting of current n.p.o. status.           #) Chronic iron deficiency anemia: Documented history of such, a/w with baseline hgb range 8-10, with presenting hgb consistent with this range, in the absence of any overt evidence of active bleed.  He is on daily oral iron supplementation at home   Plan: Repeat CBC in the morning.  Hold oral iron supplementation in the setting of current n.p.o. status.  Check INR.      DVT prophylaxis: SCD's   Code Status: DNR/DNI per active DNR paperwork from SNF, and confirmed via my discussions with the patient this evening. Family Communication: none Disposition Plan: Per Rounding Team Consults called: EDP discussed the patient's case/imaging with on-call [facility], as further detailed above;  Admission status: obs   PLEASE NOTE THAT DRAGON DICTATION SOFTWARE WAS USED IN THE CONSTRUCTION OF THIS NOTE.   [place] DO [facility] [place]   03/26/2022, 11:12 PM

## 2022-03-26 NOTE — ED Notes (Signed)
Placed dnr sticker on armband.

## 2022-03-26 NOTE — ED Triage Notes (Signed)
Pt here from ortho clinic after being dx with multiple R hip fx earlier today. Pt with mechanical fall X2 yesterday. Pt not on anticoagulation.

## 2022-03-26 NOTE — ED Provider Notes (Signed)
[facility] DEPT Provider Note   CSN: [id number] Arrival date & time: 03/26/22  1506     History  Chief Complaint  Patient presents with   Fall   Hip Pain    Brandy [name] is a 88 y.o. female.  Patient as above with significant medical history as below, including HLD, HTN, insomnia on Ambien, GERD, breast cancer status postmastectomy who presents to the ED with complaint of fall, hip/pelvis pain.  She ambulates with a walker, she has had 2 falls over the past 24 hours onto her backside.  No head injury or LOC.  She has been ambulatory since the falls but is having some pain to her right hip, pelvis when she walks around and when she sits for prolonged periods of time.  No numbness or tingling to her lower extremities that seems new, no change in bowel or bladder function, no bowel/urinary incontinence or overflow.  No blood thinners, no head trauma.  No chest pain or dyspnea, no nausea or vomiting.  Per family patient went to PCP initially received x-rays, was instructed to go to Ortho clinic who then sent the patient to the ER   Past Medical History:  Diagnosis Date   Anemia    Arthritis    shoulders   Asthma    mild, no inhalers used   Breast cancer ([place]) 12/18/2011   left breast masectomy=metastatic ca in (1/1) lymph node ,invasive ductal ca,2 foci,,dcis,lymph ovascular invasion identified,surgical resection margins neg for ca,additional tissue=benign skin and subcutaneous tissue   Bronchitis    hx of;last time >80yrago   Depression    takes Celexa daily   Full dentures    Gall stones 2015   GERD (gastroesophageal reflux disease)    takes Prilosec prn   H/O hiatal hernia    History of kidney stones    many yrs ago   Hx of radiation therapy 04/26/12 -06/10/12   left breast   Hyperlipidemia    takes Simvastatin daily   Hypertension    takes Amlodipine and Diovan daily   Insomnia    takes Ambien nightly   Urinary urgency     Past  Surgical History:  Procedure Laterality Date   APPENDECTOMY     BILIARY DILATION  02/07/2022   Procedure: BILIARY DILATION;  Surgeon: GJackquline Denmark MD;  [place] [facility]  Service: Gastroenterology;;   BILIARY DILATION  03/19/2022   Procedure: BILIARY DILATION;  Surgeon: MIrving Copas, MD;  [facility];  Service: Gastroenterology;;   BILIARY STENT PLACEMENT  02/07/2022   Procedure: BILIARY STENT PLACEMENT;  Surgeon: GJackquline Denmark MD;  [place] [facility]  Service: Gastroenterology;;   BIOPSY  03/19/2022   Procedure: BIOPSY;  Surgeon: MIrving Copas, MD;  [facility];  Service: Gastroenterology;;   bladder tack     BREAST BIOPSY  1998   left    DILATION AND CURETTAGE OF UTERUS     ENDOSCOPIC RETROGRADE CHOLANGIOPANCREATOGRAPHY (ERCP) WITH PROPOFOL N/A 02/01/2014   Procedure: ENDOSCOPIC RETROGRADE CHOLANGIOPANCREATOGRAPHY (ERCP) WITH PROPOFOL;  Surgeon: DMilus Banister MD;  [facility];  Service: Endoscopy;  Laterality: N/A;   ENDOSCOPIC RETROGRADE CHOLANGIOPANCREATOGRAPHY (ERCP) WITH PROPOFOL N/A 03/19/2022   Procedure: ENDOSCOPIC RETROGRADE CHOLANGIOPANCREATOGRAPHY (ERCP) WITH PROPOFOL;  Surgeon: MRush LandmarkGTelford Nab, MD;  [facility];  Service: Gastroenterology;  Laterality: N/A;   ERCP N/A 02/07/2022   Procedure: ENDOSCOPIC RETROGRADE CHOLANGIOPANCREATOGRAPHY (ERCP);  Surgeon: GJackquline Denmark MD;  [facility];  Service: Gastroenterology;  Laterality: N/A;   ESOPHAGOGASTRODUODENOSCOPY     ESOPHAGOGASTRODUODENOSCOPY N/A 06/26/2016   Procedure: ESOPHAGOGASTRODUODENOSCOPY (EGD);  Surgeon: [name], MD;  Location: [place];  Service: Endoscopy;  Laterality: N/A;   ESOPHAGOGASTRODUODENOSCOPY (EGD) WITH PROPOFOL  12/19/2013   Procedure: ESOPHAGOGASTRODUODENOSCOPY (EGD) WITH PROPOFOL;  Surgeon: Beryle Beams, MD;  Location: [place];  Service: Endoscopy;;   ESOPHAGOGASTRODUODENOSCOPY (EGD) WITH PROPOFOL  N/A 09/10/2016   Procedure: ESOPHAGOGASTRODUODENOSCOPY (EGD) WITH PROPOFOL;  Surgeon: [name], MD;  [facility];  Service: Endoscopy;  Laterality: N/A;   EUS  06/04/2011   Procedure: UPPER ENDOSCOPIC ULTRASOUND (EUS) LINEAR;  Surgeon: Owens Loffler, MD;  [facility];  Service: Endoscopy;  Laterality: N/A;   EUS N/A 02/01/2014   Procedure: UPPER ENDOSCOPIC ULTRASOUND (EUS) LINEAR;  Surgeon: [name], MD;  [facility];  Service: Endoscopy;  Laterality: N/A;   EXPLORATORY LAPAROTOMY      biopsy of intra-abdominal mass   INTRAMEDULLARY (IM) NAIL INTERTROCHANTERIC Right 10/24/2018   Procedure: INTRAMEDULLARY (IM) NAIL INTERTROCHANTRIC;  Surgeon: Gaynelle Arabian, MD;  [facility];  Service: Orthopedics;  Laterality: Right;   LAPAROTOMY N/A 11/17/2016   Procedure: EXPLORATORY LAPAROTOMY WITH LYSIS OF ADHESIONS, SMALL BOWEL RESECTION, REPAIR OF INCARCERATED VENTRAL INCISIONAL HERNIA, INSERTION OF BIOLOGIC MESH PATCH,APPLICATION OF WOUND VAC DRESSING;  Surgeon: Armandina Gemma, MD;  [facility];  Service: General;  Laterality: N/A;   MASTECTOMY W/ SENTINEL NODE BIOPSY  12/18/2011   Procedure: MASTECTOMY WITH SENTINEL LYMPH NODE BIOPSY;  Surgeon: Rolm Bookbinder, MD;  Location: [place];  Service: General;  Laterality: Left;   PORT-A-CATH REMOVAL Right 06/15/2013   Procedure: REMOVAL PORT-A-CATH;  Surgeon: Rolm Bookbinder, MD;  Location: [place];  Service: General;  Laterality: Right;   PORTACATH PLACEMENT  01/27/2012   Procedure: INSERTION PORT-A-CATH;  Surgeon: Rolm Bookbinder, MD;  Location: [place];  Service: General;  Laterality: Right;  PORT PLACEMENT   REMOVAL OF STONES  02/07/2022   Procedure: REMOVAL OF STONES;  Surgeon: Jackquline Denmark, MD;  Location: [place];  Service: Gastroenterology;;   REMOVAL OF STONES  03/19/2022   Procedure: REMOVAL OF STONES;  Surgeon: Irving Copas., MD;  [facility];  Service: Gastroenterology;;   RIGHT/LEFT HEART CATH AND CORONARY ANGIOGRAPHY N/A 05/08/2021   Procedure: RIGHT/LEFT HEART CATH AND CORONARY ANGIOGRAPHY;  Surgeon: Jettie Booze, MD;  Location: [facility];  Service: Cardiovascular;  Laterality: N/A;   SPHINCTEROTOMY  02/07/2022   Procedure: SPHINCTEROTOMY;  Surgeon: Jackquline Denmark, MD;  Location[facility];  Service: Gastroenterology;;   Bess Kinds CHOLANGIOSCOPY N/A 03/19/2022   Procedure: TKZSWFUX CHOLANGIOSCOPY;  Surgeon: Irving Copas., MD;  [facility];  Service: Gastroenterology;  Laterality: N/A;   STENT REMOVAL  03/19/2022   Procedure: STENT REMOVAL;  Surgeon: Irving Copas., MD;  [facility];  Service: Gastroenterology;;   TOTAL HIP ARTHROPLASTY Right 11/14/2018   Procedure: REMOVAL OF INTRAMEDULLARY NAIL AND POSTERIOR TOTAL HIP ARTHROPLASTY;  Surgeon: Gaynelle Arabian, MD;  [facility];  Service: Orthopedics;  Laterality: Right;   TOTAL SHOULDER REPLACEMENT  2011   left   TUBAL LIGATION       The history is provided by the patient. No language interpreter was used.  Fall Pertinent negatives include no chest pain, no abdominal pain, no headaches and no shortness of breath.  Hip Pain Pertinent negatives include no chest pain, no abdominal pain, no headaches and no shortness of breath.  Home Medications Prior to Admission medications   Medication Sig Start Date End Date Taking? Authorizing Provider  acetaminophen (TYLENOL) 500 MG tablet Take 1,000 mg by mouth every 6 (six) hours as needed for headache (pain).   Yes [provider]  alendronate (FOSAMAX) 70 MG tablet Take 70 mg by mouth every Sunday. 04/01/21  Yes [provider]  atorvastatin (LIPITOR) 40 MG tablet Take 40 mg by mouth at bedtime. 02/06/22  Yes [provider]  Cyanocobalamin (VITAMIN B-12 PO) Take 1 tablet by mouth every evening.   Yes [provider]   DULoxetine (CYMBALTA) 20 MG capsule Take 40 mg by mouth in the morning. 04/17/21  Yes [provider]  ferrous sulfate 325 (65 FE) MG tablet Take 1 tablet (325 mg total) by mouth daily. 05/09/21  Yes Jennye Boroughs, MD  gabapentin (NEURONTIN) 300 MG capsule Take 2 capsules (600 mg total) by mouth at bedtime. 01/16/22  Yes Nicholas Lose, MD  Multiple Vitamin (MULTIVITAMIN WITH MINERALS) TABS tablet Take 1 tablet by mouth in the morning.   Yes [provider]  omeprazole (PRILOSEC) 40 MG capsule Take 1 capsule (40 mg total) by mouth daily before breakfast. 03/19/22  Yes Mansouraty, Telford Nab., MD  rOPINIRole (REQUIP) 0.25 MG tablet Take 0.25 mg by mouth at bedtime. 07/28/21  Yes [provider]  traMADol (ULTRAM) 50 MG tablet Take 2 tablets (100 mg total) by mouth 4 (four) times daily. Patient taking differently: Take 100 mg by mouth 4 (four) times daily as needed (pain.). Take 1 tablet (50 mg) by mouth (scheduled) twice daily & may take 2 additional doses if needed for pain. 11/16/18  Yes Edmisten, Kristie L, PA  valsartan-hydrochlorothiazide (DIOVAN-HCT) 80-12.5 MG tablet Take 1 tablet by mouth in the morning.   Yes [provider]  Vitamin D, Ergocalciferol, (DRISDOL) 50000 units CAPS capsule Take 50,000 Units by mouth every Sunday.   Yes [provider]  metoprolol tartrate (LOPRESSOR) 100 MG tablet Take as directed the morning of 9/11 CT scan Patient not taking: Reported on 03/26/2022 03/02/22   Early Osmond, MD      Allergies    Patient has no known allergies.    Review of Systems   Review of Systems  Constitutional:  Negative for activity change and fever.  HENT:  Negative for facial swelling and trouble swallowing.   Eyes:  Negative for discharge and redness.  Respiratory:  Negative for cough and shortness of breath.   Cardiovascular:  Negative for chest pain and palpitations.  Gastrointestinal:  Negative for abdominal pain and nausea.   Genitourinary:  Negative for dysuria and flank pain.  Musculoskeletal:  Positive for arthralgias and back pain. Negative for gait problem.  Skin:  Negative for pallor and rash.  Neurological:  Negative for syncope and headaches.    Physical Exam Updated Vital Signs BP (!) 156/66   Pulse 97   Temp 98.4 F (36.9 C)   Resp 19   SpO2 99%  Physical Exam Vitals and nursing note reviewed.  Constitutional:      General: She is not in acute distress.    Appearance: Normal appearance. She is not ill-appearing.  HENT:     Head: Normocephalic and atraumatic.     Right Ear: External ear normal.     Left Ear: External ear normal.     Nose: Nose normal.     Mouth/Throat:     Mouth: Mucous membranes are moist.  Eyes:     General:  No scleral icterus.       Right eye: No discharge.        Left eye: No discharge.  Cardiovascular:     Rate and Rhythm: Normal rate and regular rhythm.     Pulses: Normal pulses.     Heart sounds: Normal heart sounds.  Pulmonary:     Effort: Pulmonary effort is normal. No respiratory distress.     Breath sounds: Normal breath sounds.  Abdominal:     General: Abdomen is flat.     Tenderness: There is no abdominal tenderness.  Musculoskeletal:        General: Normal range of motion.       Arms:     Cervical back: Normal range of motion.     Right lower leg: No edema.     Left lower leg: No edema.       Legs:     Comments: No pain to lower extremities to logroll bilateral  There is TTP to the lumbar spine on palpation, no TTP to thoracic or cervical spine on palpation/percussion. TTP noted to pelvis on right, no TTP to right  side of pelvis ; pelvis stable to AP pressure   Pelvis stable to AP pressure  PT pulses equal bilateral  Skin:    General: Skin is warm and dry.     Capillary Refill: Capillary refill takes less than 2 seconds.  Neurological:     Mental Status: She is alert and oriented to person, place, and time. Mental status is at baseline.      GCS: GCS eye subscore is 4. GCS verbal subscore is 5. GCS motor subscore is 6.  Psychiatric:        Mood and Affect: Mood normal.        Behavior: Behavior normal.     ED Results / Procedures / Treatments   Labs (all labs ordered are listed, but only abnormal results are displayed) Labs Reviewed  CBC - Abnormal; Notable for the following components:      Result Value   RBC 2.81 (*)    Hemoglobin 8.4 (*)    HCT 26.7 (*)    Platelets 144 (*)    All other components within normal limits  BASIC METABOLIC PANEL - Abnormal; Notable for the following components:   Chloride 115 (*)    CO2 21 (*)    Glucose, Bld 111 (*)    BUN 28 (*)    Creatinine, Ser 1.08 (*)    GFR, Estimated 50 (*)    All other components within normal limits  MAGNESIUM  PROTIME-INR  CBC WITH DIFFERENTIAL/PLATELET  COMPREHENSIVE METABOLIC PANEL  MAGNESIUM    EKG None  Radiology CT L-SPINE NO CHARGE  Result Date: 03/26/2022 CLINICAL DATA:  Initial evaluation for acute trauma, fall. EXAM: CT LUMBAR SPINE WITHOUT CONTRAST TECHNIQUE: Multidetector CT imaging of the lumbar spine was performed without intravenous contrast administration. Multiplanar CT image reconstructions were also generated. RADIATION DOSE REDUCTION: This exam was performed according to the departmental dose-optimization program which includes automated exposure control, adjustment of the mA and/or kV according to patient size and/or use of iterative reconstruction technique. COMPARISON:  Prior CTs from earlier the same day. FINDINGS: Segmentation: Standard. Lowest well-formed disc space labeled the L5-S1 level. Alignment: Trace retrolisthesis of T12 on L1 and L1 on L2, likely chronic and degenerative. Alignment otherwise normal preservation of the normal lumbar lordosis. Vertebrae: Acute nondisplaced fracture of the right sacral ala. No visible extension through the sacral foramina.  Acute fractures of the right superior and inferior pubic rami  noted. No acute vertebral body fracture. Chronic height loss noted at T10. Underlying osteopenia. No worrisome osseous lesions. Paraspinal and other soft tissues: Paraspinous soft tissues better evaluated on corresponding CT of the abdomen and pelvis. No visible abnormality. Aortic atherosclerosis. Right total hip arthroplasty partially visualized. Disc levels: Underlying mild for age multilevel degenerative spondylosis, most pronounced at L4-5 and L5-S1. No high-grade spinal stenosis by CT. IMPRESSION: 1. Acute nondisplaced fracture of the right sacral ala. 2. Acute fractures of the right superior and inferior pubic rami. 3. No other acute traumatic injury within the lumbar spine. Aortic Atherosclerosis (ICD10-I70.0). Electronically Signed   By: Jeannine Boga M.D.   On: 03/26/2022 20:14   CT ABDOMEN PELVIS WO CONTRAST  Result Date: 03/26/2022 CLINICAL DATA:  Pelvis fracture.  Mechanical fall yesterday. EXAM: CT ABDOMEN AND PELVIS WITHOUT CONTRAST TECHNIQUE: Multidetector CT imaging of the abdomen and pelvis was performed following the standard protocol without IV contrast. RADIATION DOSE REDUCTION: This exam was performed according to the departmental dose-optimization program which includes automated exposure control, adjustment of the mA and/or kV according to patient size and/or use of iterative reconstruction technique. COMPARISON:  Abdominopelvic CT a 03/16/2022 FINDINGS: Lower chest: Similar pleural thickening to recent prior. No acute basilar airspace disease. The heart is normal in size. Hepatobiliary: Interval removal of biliary stent. Increased pneumobilia likely related to recent ERCP. Grossly stable biliary prominence on this unenhanced exam. Air in the gallbladder again seen. No pericholecystic inflammation. Homogeneous hepatic attenuation without acute findings. Pancreas: Parenchymal atrophy. No ductal dilatation or inflammation. Spleen: Homogeneous attenuation, normal in size. No evidence  of injury on this unenhanced exam. Adrenals/Urinary Tract: Normal adrenal glands. Bilateral renal parenchymal thinning. Bilateral renal cysts including left kidney peripelvic cysts. No specific imaging follow-up is recommended. No hydronephrosis. Unremarkable urinary bladder. Stomach/Bowel: Small hiatal hernia. No bowel obstruction or inflammation. Small bowel enteric sutures in the central abdomen with an adjacent patulous loop of small bowel. Moderate volume of colonic stool. Appendix is not definitively seen. Vascular/Lymphatic: Aortic atherosclerosis without aneurysm. No adenopathy. Reproductive: Normal for age uterine atrophy.  No adnexal mass. Other: No ascites or free fluid. No free air. Postsurgical change of the anterior abdominal wall. Musculoskeletal: Lumbar spine assessed on concurrent lumbar spine reformats, reported separately. Mildly comminuted and displaced fracture of the right superior pubic ramus at the puboacetabular junction. Fracture does not extend to the right hip arthroplasty. Displaced and comminuted fracture of the right inferior pubic ramus. There is a nondisplaced right sacral fracture without definite involvement of the sacral foramen. The bones are diffusely under mineralized. Right hip arthroplasty without periprosthetic fracture. IMPRESSION: 1. Right superior and inferior pubic rami fractures, mildly comminuted and displaced. 2. Nondisplaced right sacral fracture without definite involvement of the sacral foramen. 3. Interval removal of biliary stent. Increased pneumobilia likely related to recent ERCP. Grossly stable biliary prominence on this unenhanced exam. 4. Additional stable chronic findings as described. Aortic Atherosclerosis (ICD10-I70.0). Electronically Signed   By: Keith Rake M.D.   On: 03/26/2022 19:57   DG Hip Unilat  With Pelvis 2-3 Views Right  Result Date: 03/26/2022 CLINICAL DATA:  Hip fracture. EXAM: DG HIP (WITH OR WITHOUT PELVIS) 2-3V RIGHT COMPARISON:   None Available. FINDINGS: Right total hip arthroplasty. Mildly displaced fractures of the right inferior and superior pubic rami. No hip dislocation. Calcific atherosclerosis. Lower lumbar degenerative change. Scattered enthesopathy. IMPRESSION: Mildly displaced fractures of the right inferior and superior pubic  rami. Electronically Signed   By: Margaretha Sheffield M.D.   On: 03/26/2022 16:42    Procedures Procedures    Medications Ordered in ED Medications  acetaminophen (TYLENOL) tablet 650 mg (has no administration in time range)    Or  acetaminophen (TYLENOL) suppository 650 mg (has no administration in time range)  naloxone (NARCAN) injection 0.4 mg (has no administration in time range)  fentaNYL (SUBLIMAZE) injection 25 mcg (has no administration in time range)  ondansetron (ZOFRAN) injection 4 mg (4 mg Intravenous Given 03/26/22 2336)  HYDROcodone-acetaminophen (NORCO/VICODIN) 5-325 MG per tablet 1 tablet (1 tablet Oral Given 03/26/22 1731)  HYDROcodone-acetaminophen (NORCO/VICODIN) 5-325 MG per tablet 1 tablet (1 tablet Oral Given 03/26/22 2336)    ED Course/ Medical Decision Making/ A&P                           Medical Decision Making Amount and/or Complexity of Data Reviewed Labs: ordered. Radiology: ordered.  Risk Prescription drug management. Decision regarding hospitalization.   This patient presents to the ED with chief complaint(s) of fall, pelvis, hip pain/back pain with pertinent past medical history of above which further complicates the presenting complaint. The complaint involves an extensive differential diagnosis and also carries with it a high risk of complications and morbidity.    Differential diagnosis includes but is not exclusive to musculoskeletal back pain, renal colic, urinary tract infection, pyelonephritis, intra-abdominal causes of back pain, aortic aneurysm or dissection, cauda equina syndrome, sciatica, lumbar disc disease, thoracic disc disease,  fracture, contusion, sprain, strain, vascular injury etc.  . Serious etiologies were considered.   The initial plan is to x-ray was obtained in triage,   Additional history obtained: Additional history obtained from family Records reviewed Primary Care Documents prior labs and imaging, home medications,   Independent labs interpretation:  The following labs were independently interpreted:  CBC and BMP are similar to patient's baseline Chronically anemic  Independent visualization of imaging: - I independently visualized the following imaging with scope of interpretation limited to determining acute life threatening conditions related to emergency care: Right hip pelvis x-ray ordered in triage, also order CT abdomen pelvis and CT L-spine, which revealed inferior and superior pubic ramus fracture, sacral fracture  Cardiac monitoring was reviewed and interpreted by myself which shows na  Treatment and Reassessment: Norco >> Symptoms improved   Consultation: - Consulted or discussed management/test interpretation w/ external professional: Orthopedics on-call Dr. Kathaleen Bury; they have reviewed CT imaging.  After discussion with orthopedics recommend observation overnight for orthopedic evaluation in the morning, possible surgical intervention to the sacral fracture.  Reasonable for the patient stay at [facility] long per Ortho  Consideration for admission or further workup: Admission was considered   88 year old female history as above to the ED following apparent mechanical fall x2.  Found to have pelvic fracture, sacral fracture.  Extremities neurovascular intact bilateral.  Plan for observation overnight, Ortho evaluation in the morning.  Discussed with hospitalist Dr. Velia Meyer who accepts patient for admission. Will NPO @ [facility] >> Patient uses walker primarily for ambulation   Social Determinants of health: Social History   Tobacco Use   Smoking status: Never   Smokeless tobacco:  Never  Vaping Use   Vaping Use: Never used  Substance Use Topics   Alcohol use: No   Drug use: No            Final Clinical Impression(s) / ED Diagnoses Final diagnoses:  Closed fracture of  sacrum, unspecified portion of sacrum, initial encounter [facility])  Closed fracture of ramus of right pubis, initial encounter [facility])  Fall, initial encounter    Rx / DC Orders ED Discharge Orders     None         Jeanell Sparrow, DO 03/27/22 0024

## 2022-03-26 NOTE — ED Notes (Signed)
Pt resting, family at bedside, no complaints at this time.

## 2022-03-26 NOTE — ED Notes (Signed)
Patient transported to CT 

## 2022-03-27 ENCOUNTER — Encounter (HOSPITAL_COMMUNITY): Payer: Self-pay | Admitting: Internal Medicine

## 2022-03-27 DIAGNOSIS — K219 Gastro-esophageal reflux disease without esophagitis: Secondary | ICD-10-CM | POA: Diagnosis not present

## 2022-03-27 DIAGNOSIS — I10 Essential (primary) hypertension: Secondary | ICD-10-CM | POA: Diagnosis not present

## 2022-03-27 DIAGNOSIS — D500 Iron deficiency anemia secondary to blood loss (chronic): Secondary | ICD-10-CM | POA: Diagnosis not present

## 2022-03-27 DIAGNOSIS — S3210XA Unspecified fracture of sacrum, initial encounter for closed fracture: Secondary | ICD-10-CM | POA: Diagnosis present

## 2022-03-27 DIAGNOSIS — F32A Depression, unspecified: Secondary | ICD-10-CM | POA: Diagnosis not present

## 2022-03-27 DIAGNOSIS — E860 Dehydration: Secondary | ICD-10-CM | POA: Diagnosis present

## 2022-03-27 DIAGNOSIS — D509 Iron deficiency anemia, unspecified: Secondary | ICD-10-CM | POA: Diagnosis not present

## 2022-03-27 DIAGNOSIS — W19XXXA Unspecified fall, initial encounter: Secondary | ICD-10-CM

## 2022-03-27 DIAGNOSIS — S32591A Other specified fracture of right pubis, initial encounter for closed fracture: Secondary | ICD-10-CM | POA: Diagnosis not present

## 2022-03-27 DIAGNOSIS — W1830XA Fall on same level, unspecified, initial encounter: Secondary | ICD-10-CM | POA: Diagnosis not present

## 2022-03-27 LAB — CBC WITH DIFFERENTIAL/PLATELET
Abs Immature Granulocytes: 0.03 10*3/uL (ref 0.00–0.07)
Basophils Absolute: 0 10*3/uL (ref 0.0–0.1)
Basophils Relative: 0 %
Eosinophils Absolute: 0.2 10*3/uL (ref 0.0–0.5)
Eosinophils Relative: 2 %
HCT: 26.1 % — ABNORMAL LOW (ref 36.0–46.0)
Hemoglobin: 8.1 g/dL — ABNORMAL LOW (ref 12.0–15.0)
Immature Granulocytes: 0 %
Lymphocytes Relative: 24 %
Lymphs Abs: 1.6 10*3/uL (ref 0.7–4.0)
MCH: 29.8 pg (ref 26.0–34.0)
MCHC: 31 g/dL (ref 30.0–36.0)
MCV: 96 fL (ref 80.0–100.0)
Monocytes Absolute: 0.6 10*3/uL (ref 0.1–1.0)
Monocytes Relative: 9 %
Neutro Abs: 4.3 10*3/uL (ref 1.7–7.7)
Neutrophils Relative %: 65 %
Platelets: 149 10*3/uL — ABNORMAL LOW (ref 150–400)
RBC: 2.72 MIL/uL — ABNORMAL LOW (ref 3.87–5.11)
RDW: 15.7 % — ABNORMAL HIGH (ref 11.5–15.5)
WBC: 6.8 10*3/uL (ref 4.0–10.5)
nRBC: 0 % (ref 0.0–0.2)

## 2022-03-27 LAB — URINALYSIS, COMPLETE (UACMP) WITH MICROSCOPIC
Bilirubin Urine: NEGATIVE
Glucose, UA: NEGATIVE mg/dL
Hgb urine dipstick: NEGATIVE
Ketones, ur: NEGATIVE mg/dL
Leukocytes,Ua: NEGATIVE
Nitrite: NEGATIVE
Protein, ur: NEGATIVE mg/dL
Specific Gravity, Urine: 1.014 (ref 1.005–1.030)
pH: 5 (ref 5.0–8.0)

## 2022-03-27 LAB — PROTIME-INR
INR: 1.1 (ref 0.8–1.2)
Prothrombin Time: 14.5 seconds (ref 11.4–15.2)

## 2022-03-27 LAB — COMPREHENSIVE METABOLIC PANEL
ALT: 22 U/L (ref 0–44)
AST: 21 U/L (ref 15–41)
Albumin: 3 g/dL — ABNORMAL LOW (ref 3.5–5.0)
Alkaline Phosphatase: 67 U/L (ref 38–126)
Anion gap: 4 — ABNORMAL LOW (ref 5–15)
BUN: 26 mg/dL — ABNORMAL HIGH (ref 8–23)
CO2: 21 mmol/L — ABNORMAL LOW (ref 22–32)
Calcium: 8.8 mg/dL — ABNORMAL LOW (ref 8.9–10.3)
Chloride: 115 mmol/L — ABNORMAL HIGH (ref 98–111)
Creatinine, Ser: 0.98 mg/dL (ref 0.44–1.00)
GFR, Estimated: 56 mL/min — ABNORMAL LOW (ref >60)
Glucose, Bld: 114 mg/dL — ABNORMAL HIGH (ref 70–99)
Potassium: 4 mmol/L (ref 3.5–5.1)
Sodium: 140 mmol/L (ref 135–145)
Total Bilirubin: 0.6 mg/dL (ref 0.3–1.2)
Total Protein: 6.1 g/dL — ABNORMAL LOW (ref 6.5–8.1)

## 2022-03-27 LAB — SURGICAL PCR SCREEN
MRSA, PCR: NEGATIVE
Staphylococcus aureus: NEGATIVE

## 2022-03-27 LAB — MAGNESIUM: Magnesium: 2 mg/dL (ref 1.7–2.4)

## 2022-03-27 MED ORDER — ACETAMINOPHEN 500 MG PO TABS
1000.0000 mg | ORAL_TABLET | Freq: Four times a day (QID) | ORAL | Status: DC | PRN
  Administered 2022-03-27 – 2022-03-30 (×6): 1000 mg via ORAL
  Filled 2022-03-27 (×6): qty 2

## 2022-03-27 MED ORDER — PANTOPRAZOLE SODIUM 40 MG PO TBEC
40.0000 mg | DELAYED_RELEASE_TABLET | Freq: Every day | ORAL | Status: DC
  Administered 2022-03-27 – 2022-03-30 (×4): 40 mg via ORAL
  Filled 2022-03-27 (×4): qty 1

## 2022-03-27 MED ORDER — DULOXETINE HCL 20 MG PO CPEP
40.0000 mg | ORAL_CAPSULE | Freq: Every day | ORAL | Status: DC
  Administered 2022-03-28 – 2022-03-30 (×3): 40 mg via ORAL
  Filled 2022-03-27 (×3): qty 2

## 2022-03-27 MED ORDER — ROPINIROLE HCL 0.5 MG PO TABS
0.2500 mg | ORAL_TABLET | Freq: Every day | ORAL | Status: DC
  Administered 2022-03-27 – 2022-03-29 (×3): 0.25 mg via ORAL
  Filled 2022-03-27 (×3): qty 1

## 2022-03-27 MED ORDER — GABAPENTIN 300 MG PO CAPS
600.0000 mg | ORAL_CAPSULE | Freq: Every day | ORAL | Status: DC
  Administered 2022-03-27 – 2022-03-29 (×3): 600 mg via ORAL
  Filled 2022-03-27 (×3): qty 2

## 2022-03-27 MED ORDER — TRAMADOL HCL 50 MG PO TABS
50.0000 mg | ORAL_TABLET | Freq: Four times a day (QID) | ORAL | Status: DC | PRN
  Administered 2022-03-27 – 2022-03-30 (×9): 50 mg via ORAL
  Filled 2022-03-27 (×9): qty 1

## 2022-03-27 MED ORDER — LACTATED RINGERS IV SOLN: INTRAVENOUS | Status: DC

## 2022-03-27 MED ORDER — ATORVASTATIN CALCIUM 40 MG PO TABS
40.0000 mg | ORAL_TABLET | Freq: Every day | ORAL | Status: DC
  Administered 2022-03-27 – 2022-03-29 (×3): 40 mg via ORAL
  Filled 2022-03-27 (×3): qty 1

## 2022-03-27 NOTE — TOC Initial Note (Signed)
Transition of Care [facility]) - Initial/Assessment Note    Patient Details  Name: Brandy [name] MRN: [id number] Date of Birth: December 13, 1932  Transition of Care [facility]) CM/SW Contact:    Lennart Pall, LCSW Phone Number: 03/27/2022, 3:49 PM  Clinical Narrative:                 Met with pt and daughter to introduce TOC/CSW role and review dc planning needs.  Pt reports she lives alone with intermittent assist of her two children.  She admits very frustrated with her current situation.  Notes she and daughter are agreed she will need SNF rehab prior to return home.  They would prefer facilities closer to [facility] and [place].  Will alert oncoming TOC.  Expected Discharge Plan: Skilled Nursing Facility Barriers to Discharge: Continued Medical Work up, Ship broker, SNF Pending bed offer   Patient Goals and CMS Choice Patient states their goals for this hospitalization and ongoing recovery are:: return home following rehab      Expected Discharge Plan and Services Expected Discharge Plan: [place] In-house Referral: Clinical Social Work   Post Acute Care Choice: [place] Living arrangements for the past 2 months: Single Family Home                 DME Arranged: N/A DME Agency: NA                  Prior Living Arrangements/Services Living arrangements for the past 2 months: Single Family Home Lives with:: Self Patient language and need for interpreter reviewed:: Yes Do you feel safe going back to the place where you live?: Yes      Need for Family Participation in Patient Care: Yes (Comment) Care giver support system in place?: Yes (comment)   Criminal Activity/Legal Involvement Pertinent to Current Situation/Hospitalization: No - Comment as needed  Activities of Daily Living Home Assistive Devices/Equipment: Gilford Rile (specify type) ADL Screening (condition at time of admission) Patient's cognitive ability adequate to safely complete  daily activities?: Yes Is the patient deaf or have difficulty hearing?: Yes Does the patient have difficulty seeing, even when wearing glasses/contacts?: No Does the patient have difficulty concentrating, remembering, or making decisions?: No Patient able to express need for assistance with ADLs?: Yes Does the patient have difficulty dressing or bathing?: No Independently performs ADLs?: Yes (appropriate for developmental age) Does the patient have difficulty walking or climbing stairs?: Yes Weakness of Legs: Both Weakness of Arms/Hands: Both  Permission Sought/Granted Permission sought to share information with : Facility Sport and exercise psychologist, Family Supports Permission granted to share information with : Yes, Verbal Permission Granted  Share Information with NAME: Lew Dawes     Permission granted to share info w Relationship: daughter  Permission granted to share info w Contact Information: [phone]  Emotional Assessment Appearance:: Appears stated age Attitude/Demeanor/Rapport: Gracious Affect (typically observed): Accepting Orientation: : Oriented to Self, Oriented to Place, Oriented to  Time, Oriented to Situation Alcohol / Substance Use: Not Applicable Psych Involvement: No (comment)  Admission diagnosis:  Pubic ramus fracture ([place]) [S32.599A] Fall, initial encounter [W19.XXXA] Closed fracture of ramus of right pubis, initial encounter ([place]) [S32.591A] Closed fracture of sacrum, unspecified portion of sacrum, initial encounter [facility]) [S32.10XA] Patient Active Problem List   Diagnosis Date Noted   Fall at home, initial encounter 03/27/2022   Dehydration 03/27/2022   Sacral fracture, closed ([place])    Pubic ramus fracture ([place]) 03/26/2022   Splenic artery aneurysm - 7 mm 02/09/2022  Cholangitis due to bile duct calculus with obstruction 02/09/2022   AKI (acute kidney injury) ([place]) 02/09/2022   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 02/09/2022    History of breast cancer 02/07/2022   History of partial gastrectomy 02/07/2022   Normocytic anemia 01/16/2022   Syncope and collapse    PNA (pneumonia) 05/06/2021   Acute on chronic respiratory failure with hypoxemia ([place]) 05/05/2021   Hip fracture ([employer]) 11/11/2018   Acute respiratory failure with hypoxia ([place]) 10/29/2018   S/P right hip fracture 10/24/2018   Acute on chronic diastolic CHF (congestive heart failure) ([place]) 01/19/2017   HCAP (healthcare-associated pneumonia) 12/11/2016   S/P exploratory laparotomy 11/17/2016   Small bowel obstruction ([place]) 11/17/2016   Pre-operative cardiovascular examination    Incarcerated incisional hernia s/p SB resection & repair 11/17/2016 11/15/2016   Acute renal failure (ARF) ([place]) 11/15/2016   Hyperglycemia 11/15/2016   Asthma without status asthmaticus 09/24/2016   Carpal tunnel syndrome 09/24/2016   Asthma 09/24/2016   Dark stools 09/24/2016   Degenerative arthritis of lumbar spine 09/24/2016   DJD (degenerative joint disease), cervical 09/24/2016   Dyspnea on exertion 09/24/2016   Generalized anxiety disorder 09/24/2016   Lumbosacral spondylosis without myelopathy 09/24/2016   Morbid (severe) obesity due to excess calories ([place]) 09/24/2016   Pernicious anemia 09/24/2016   Pure hypercholesterolemia 09/24/2016   Right knee pain 09/24/2016   Sleep disorder 09/24/2016   Vitamin B 12 deficiency 09/24/2016   Vitamin D deficiency 09/24/2016   Gastric ulcer    Chronic iron deficiency anemia    Abdominal pain, chronic, epigastric    Acute gastric ulcer without hemorrhage or perforation    Duodenal ulcer    Closed fracture of nasal septum 08/27/2015   Closed fracture of zygomatic arch ([place]) 08/27/2015   Protein-calorie malnutrition, severe ([place]) 12/18/2013   Choledocholithiasis 12/17/2013   Sepsis ([place]) 12/17/2013   Osteopenia 11/17/2012   Aortic valve stenosis 10/17/2012   Hx of radiation therapy    Depression    History of kidney  stones    Urinary urgency    Gastroesophageal reflux disease without esophagitis    Hyperlipidemia    Essential hypertension    Bronchitis    Arthritis    Dysphagia    H/O hiatal hernia    Primary cancer of upper outer quadrant of left female breast ([place]) 12/01/2011   PCP:  Kathyrn Lass, MD Pharmacy:   [id number] [place] [address]Phone: [phone]: [phone]    Social Determinants of Health (SDOH) Interventions    Readmission Risk Interventions    02/09/2022    3:00 PM  Readmission Risk Prevention Plan  Transportation Screening Complete  PCP or Specialist Appt within 5-7 Days Complete  Home Care Screening Complete  Medication Review (RN CM) Complete

## 2022-03-27 NOTE — Plan of Care (Signed)

## 2022-03-27 NOTE — Progress Notes (Addendum)
**Note Brandy [name]** PROGRESS NOTE    [name]  DGU:[id number] DOB: [birth year] DOA: 03/26/2022 PCP: Kathyrn Lass, MD    Brief Narrative:  Brandy [name] is a 88 y.o. female with medical history significant for essential hypertension, hyperlipidemia, chronic iron deficiency anemia with baseline hemoglobin range 8-10, who is admitted to [facility] on 03/26/2022 with acute right inferior/superior pubic rami fractures as well as acute right sacral alla fracture after presenting from HOME to [facility] ED complaining of right hip and acute right low back pain following ground-level mechanical fall.  Per orthopedics non-operative-- WBAT with walker-- get post mobilization x rays.  Assessment and Plan:   Acute right inferior/superior pubic rami fracture and acute fracture of right sacral ala from a ground level fall -on-call [facility] (Dr. Pasty Arch, can WBAT with walker, get post-mobilization films -pain control -PT/OT   Depression:  -resume home meds      GERD:  -resume PPO    Essential Hypertension:  -resume home meds    Hyperlipidemia:  -outpatient follow up    Chronic iron deficiency anemia:  -outpatient follow up          DVT prophylaxis: SCDs Start: 03/26/22 2310    Code Status: DNR Family Communication:   Disposition Plan:  Level of care: Telemetry Status is: Observation The patient will require care spanning > 2 midnights and should be moved to inpatient because: pain control, PT/OT  -back to SNF when able-- [facility] consulted    Consultants:  ortho   Subjective: Having pain--not feeling well  Objective: Vitals:   03/27/22 0104 03/27/22 0113 03/27/22 0649 03/27/22 0850  BP: (!) 158/67  138/63 (!) 154/62  Pulse: 97  81 86  Resp: '17 18 17 17  '$ Temp: 98.2 F (36.8 C)  98.7 F (37.1 C) 99.1 F (37.3 C)  TempSrc: Oral   Oral  SpO2: 90% 95% 96% 94%  Weight:  58.4 kg    Height:  '4\' 11"'$  (1.499 m)      Intake/Output Summary (Last 24 hours) at 03/27/2022  1214 Last data filed at 03/27/2022 1053 Gross per 24 hour  Intake 730.14 ml  Output 700 ml  Net 30.14 ml   Filed Weights   03/27/22 0113  Weight: 58.4 kg    Examination:   General: Appearance:     Overweight female in no acute distress     Lungs:     respirations unlabored  Heart:    Normal heart rate. Normal rhythm.     MS:   All extremities are intact.    Neurologic:   Awake, alert       Data Reviewed: I have personally reviewed following labs and imaging studies  CBC: Recent Labs  Lab 03/26/22 2147 03/27/22 0349  WBC 7.6 6.8  NEUTROABS  --  4.3  HGB 8.4* 8.1*  HCT 26.7* 26.1*  MCV 95.0 96.0  PLT 144* 956*   Basic Metabolic Panel: Recent Labs  Lab 03/26/22 2147 03/27/22 0349  NA 141 140  K 4.2 4.0  CL 115* 115*  CO2 21* 21*  GLUCOSE 111* 114*  BUN 28* 26*  CREATININE 1.08* 0.98  CALCIUM 9.1 8.8*  MG 2.0 2.0   GFR: Estimated Creatinine Clearance: 31.5 mL/min (by C-G formula based on SCr of 0.98 mg/dL). Liver Function Tests: Recent Labs  Lab 03/27/22 0349  AST 21  ALT 22  ALKPHOS 67  BILITOT 0.6  PROT 6.1*  ALBUMIN 3.0*   No results for input(s): "LIPASE", "AMYLASE" in the last  168 hours. No results for input(s): "AMMONIA" in the last 168 hours. Coagulation Profile: Recent Labs  Lab 03/27/22 0349  INR 1.1   Cardiac Enzymes: No results for input(s): "CKTOTAL", "CKMB", "CKMBINDEX", "TROPONINI" in the last 168 hours. BNP (last 3 results) No results for input(s): "PROBNP" in the last 8760 hours. HbA1C: No results for input(s): "HGBA1C" in the last 72 hours. CBG: No results for input(s): "GLUCAP" in the last 168 hours. Lipid Profile: No results for input(s): "CHOL", "HDL", "LDLCALC", "TRIG", "CHOLHDL", "LDLDIRECT" in the last 72 hours. Thyroid Function Tests: No results for input(s): "TSH", "T4TOTAL", "FREET4", "T3FREE", "THYROIDAB" in the last 72 hours. Anemia Panel: No results for input(s): "VITAMINB12", "FOLATE", "FERRITIN",  "TIBC", "IRON", "RETICCTPCT" in the last 72 hours. Sepsis Labs: No results for input(s): "PROCALCITON", "LATICACIDVEN" in the last 168 hours.  No results found for this or any previous visit (from the past 240 hour(s)).       Radiology Studies: CT L-SPINE NO CHARGE  Result Date: 03/26/2022 CLINICAL DATA:  Initial evaluation for acute trauma, fall. EXAM: CT LUMBAR SPINE WITHOUT CONTRAST TECHNIQUE: Multidetector CT imaging of the lumbar spine was performed without intravenous contrast administration. Multiplanar CT image reconstructions were also generated. RADIATION DOSE REDUCTION: This exam was performed according to the departmental dose-optimization program which includes automated exposure control, adjustment of the mA and/or kV according to patient size and/or use of iterative reconstruction technique. COMPARISON:  Prior CTs from earlier the same day. FINDINGS: Segmentation: Standard. Lowest well-formed disc space labeled the L5-S1 level. Alignment: Trace retrolisthesis of T12 on L1 and L1 on L2, likely chronic and degenerative. Alignment otherwise normal preservation of the normal lumbar lordosis. Vertebrae: Acute nondisplaced fracture of the right sacral ala. No visible extension through the sacral foramina. Acute fractures of the right superior and inferior pubic rami noted. No acute vertebral body fracture. Chronic height loss noted at T10. Underlying osteopenia. No worrisome osseous lesions. Paraspinal and other soft tissues: Paraspinous soft tissues better evaluated on corresponding CT of the abdomen and pelvis. No visible abnormality. Aortic atherosclerosis. Right total hip arthroplasty partially visualized. Disc levels: Underlying mild for age multilevel degenerative spondylosis, most pronounced at L4-5 and L5-S1. No high-grade spinal stenosis by CT. IMPRESSION: 1. Acute nondisplaced fracture of the right sacral ala. 2. Acute fractures of the right superior and inferior pubic rami. 3. No  other acute traumatic injury within the lumbar spine. Aortic Atherosclerosis (ICD10-I70.0). Electronically Signed   By: Jeannine Boga M.D.   On: 03/26/2022 20:14   CT ABDOMEN PELVIS WO CONTRAST  Result Date: 03/26/2022 CLINICAL DATA:  Pelvis fracture.  Mechanical fall yesterday. EXAM: CT ABDOMEN AND PELVIS WITHOUT CONTRAST TECHNIQUE: Multidetector CT imaging of the abdomen and pelvis was performed following the standard protocol without IV contrast. RADIATION DOSE REDUCTION: This exam was performed according to the departmental dose-optimization program which includes automated exposure control, adjustment of the mA and/or kV according to patient size and/or use of iterative reconstruction technique. COMPARISON:  Abdominopelvic CT a 03/16/2022 FINDINGS: Lower chest: Similar pleural thickening to recent prior. No acute basilar airspace disease. The heart is normal in size. Hepatobiliary: Interval removal of biliary stent. Increased pneumobilia likely related to recent ERCP. Grossly stable biliary prominence on this unenhanced exam. Air in the gallbladder again seen. No pericholecystic inflammation. Homogeneous hepatic attenuation without acute findings. Pancreas: Parenchymal atrophy. No ductal dilatation or inflammation. Spleen: Homogeneous attenuation, normal in size. No evidence of injury on this unenhanced exam. Adrenals/Urinary Tract: Normal adrenal glands. Bilateral renal parenchymal  thinning. Bilateral renal cysts including left kidney peripelvic cysts. No specific imaging follow-up is recommended. No hydronephrosis. Unremarkable urinary bladder. Stomach/Bowel: Small hiatal hernia. No bowel obstruction or inflammation. Small bowel enteric sutures in the central abdomen with an adjacent patulous loop of small bowel. Moderate volume of colonic stool. Appendix is not definitively seen. Vascular/Lymphatic: Aortic atherosclerosis without aneurysm. No adenopathy. Reproductive: Normal for age uterine  atrophy.  No adnexal mass. Other: No ascites or free fluid. No free air. Postsurgical change of the anterior abdominal wall. Musculoskeletal: Lumbar spine assessed on concurrent lumbar spine reformats, reported separately. Mildly comminuted and displaced fracture of the right superior pubic ramus at the puboacetabular junction. Fracture does not extend to the right hip arthroplasty. Displaced and comminuted fracture of the right inferior pubic ramus. There is a nondisplaced right sacral fracture without definite involvement of the sacral foramen. The bones are diffusely under mineralized. Right hip arthroplasty without periprosthetic fracture. IMPRESSION: 1. Right superior and inferior pubic rami fractures, mildly comminuted and displaced. 2. Nondisplaced right sacral fracture without definite involvement of the sacral foramen. 3. Interval removal of biliary stent. Increased pneumobilia likely related to recent ERCP. Grossly stable biliary prominence on this unenhanced exam. 4. Additional stable chronic findings as described. Aortic Atherosclerosis (ICD10-I70.0). Electronically Signed   By: Keith Rake M.D.   On: 03/26/2022 19:57   DG Hip Unilat  With Pelvis 2-3 Views Right  Result Date: 03/26/2022 CLINICAL DATA:  Hip fracture. EXAM: DG HIP (WITH OR WITHOUT PELVIS) 2-3V RIGHT COMPARISON:  None Available. FINDINGS: Right total hip arthroplasty. Mildly displaced fractures of the right inferior and superior pubic rami. No hip dislocation. Calcific atherosclerosis. Lower lumbar degenerative change. Scattered enthesopathy. IMPRESSION: Mildly displaced fractures of the right inferior and superior pubic rami. Electronically Signed   By: Margaretha Sheffield M.D.   On: 03/26/2022 16:42           LOS: 0 days    Time spent: 45 minutes spent on chart review, discussion with nursing staff, consultants, updating family and interview/physical exam; more than 50% of that time was spent in counseling and/or  coordination of care.    Geradine Girt, DO [facility] Available via Epic secure chat 7am-7pm After these hours, please refer to coverage provider listed on [email or web address] 03/27/2022, 12:14 PM

## 2022-03-27 NOTE — Consult Note (Addendum)
Patient ID: FLOREINE KINGDON MRN: [id number] DOB/AGE: [birth year] 88 y.o.  Admit date: 03/26/2022  Admission Diagnoses:  Principal Problem:   Pubic ramus fracture (HCC) Active Problems:   Depression   Gastroesophageal reflux disease without esophagitis   Essential hypertension   Chronic iron deficiency anemia   Fall at home, initial encounter   Dehydration   Sacral fracture, closed ([place])   HPI: Ortho consult for right pelvis fractures sustained on 03/26/22 when she fell from standing. She denies numbness/tingling/weakness. Pain is well controlled now on RNF admitted to hospitalist service.  PMH notable for essential hypertension, hyperlipidemia, chronic iron deficiency anemia.  The patient is a household ambulator with walker for assistive device.  Past Medical History: Past Medical History:  Diagnosis Date   Anemia    Arthritis    shoulders   Asthma    mild, no inhalers used   Breast cancer ([place]) 12/18/2011   left breast masectomy=metastatic ca in (1/1) lymph node ,invasive ductal ca,2 foci,,dcis,lymph ovascular invasion identified,surgical resection margins neg for ca,additional tissue=benign skin and subcutaneous tissue   Bronchitis    hx of;last time >93yrago   Depression    takes Celexa daily   Full dentures    Gall stones 2015   GERD (gastroesophageal reflux disease)    takes Prilosec prn   H/O hiatal hernia    History of kidney stones    many yrs ago   Hx of radiation therapy 04/26/12 -06/10/12   left breast   Hyperlipidemia    takes Simvastatin daily   Hypertension    takes Amlodipine and Diovan daily   Insomnia    takes Ambien nightly   Urinary urgency     Surgical History: Past Surgical History:  Procedure Laterality Date   APPENDECTOMY     BILIARY DILATION  02/07/2022   Procedure: BILIARY DILATION;  Surgeon: GJackquline Denmark MD;  [place] [facility]  Service: Gastroenterology;;   BILIARY DILATION  03/19/2022   Procedure: BILIARY DILATION;   Surgeon: MIrving Copas, MD;  [facility];  Service: Gastroenterology;;   BILIARY STENT PLACEMENT  02/07/2022   Procedure: BILIARY STENT PLACEMENT;  Surgeon: GJackquline Denmark MD;  [place] [facility]  Service: Gastroenterology;;   BIOPSY  03/19/2022   Procedure: BIOPSY;  Surgeon: MIrving Copas, MD;  [facility];  Service: Gastroenterology;;   bladder tack     BREAST BIOPSY  1998   left    DILATION AND CURETTAGE OF UTERUS     ENDOSCOPIC RETROGRADE CHOLANGIOPANCREATOGRAPHY (ERCP) WITH PROPOFOL N/A 02/01/2014   Procedure: ENDOSCOPIC RETROGRADE CHOLANGIOPANCREATOGRAPHY (ERCP) WITH PROPOFOL;  Surgeon: DMilus Banister MD;  [facility];  Service: Endoscopy;  Laterality: N/A;   ENDOSCOPIC RETROGRADE CHOLANGIOPANCREATOGRAPHY (ERCP) WITH PROPOFOL N/A 03/19/2022   Procedure: ENDOSCOPIC RETROGRADE CHOLANGIOPANCREATOGRAPHY (ERCP) WITH PROPOFOL;  Surgeon: MRush LandmarkGTelford Nab, MD;  [facility];  Service: Gastroenterology;  Laterality: N/A;   ERCP N/A 02/07/2022   Procedure: ENDOSCOPIC RETROGRADE CHOLANGIOPANCREATOGRAPHY (ERCP);  Surgeon: GJackquline Denmark MD;  [facility]  Service: Gastroenterology;  Laterality: N/A;   ESOPHAGOGASTRODUODENOSCOPY     ESOPHAGOGASTRODUODENOSCOPY N/A 06/26/2016   Procedure: ESOPHAGOGASTRODUODENOSCOPY (EGD);  Surgeon: DMilus Banister MD;  [place]  Service: Endoscopy;  Laterality: N/A;   ESOPHAGOGASTRODUODENOSCOPY (EGD) WITH PROPOFOL  12/19/2013   Procedure: ESOPHAGOGASTRODUODENOSCOPY (EGD) WITH PROPOFOL;  Surgeon: PBeryle Beams MD;  [place]  Service: Endoscopy;;   ESOPHAGOGASTRODUODENOSCOPY (EGD) WITH PROPOFOL N/A 09/10/2016   Procedure: ESOPHAGOGASTRODUODENOSCOPY (EGD) WITH PROPOFOL;  Surgeon: [name], MD;  [facility];  Service: Endoscopy;  Laterality: N/A;   EUS  06/04/2011   Procedure: UPPER ENDOSCOPIC ULTRASOUND (EUS) LINEAR;  Surgeon: Owens Loffler,  MD;  [facility];  Service: Endoscopy;  Laterality: N/A;   EUS N/A 02/01/2014   Procedure: UPPER ENDOSCOPIC ULTRASOUND (EUS) LINEAR;  Surgeon: [name], MD;  [facility];  Service: Endoscopy;  Laterality: N/A;   EXPLORATORY LAPAROTOMY      biopsy of intra-abdominal mass   INTRAMEDULLARY (IM) NAIL INTERTROCHANTERIC Right 10/24/2018   Procedure: INTRAMEDULLARY (IM) NAIL INTERTROCHANTRIC;  Surgeon: Gaynelle Arabian, MD;  [facility];  Service: Orthopedics;  Laterality: Right;   LAPAROTOMY N/A 11/17/2016   Procedure: EXPLORATORY LAPAROTOMY WITH LYSIS OF ADHESIONS, SMALL BOWEL RESECTION, REPAIR OF INCARCERATED VENTRAL INCISIONAL HERNIA, INSERTION OF BIOLOGIC MESH PATCH,APPLICATION OF WOUND VAC DRESSING;  Surgeon: Armandina Gemma, MD;  [facility];  Service: General;  Laterality: N/A;   MASTECTOMY W/ SENTINEL NODE BIOPSY  12/18/2011   Procedure: MASTECTOMY WITH SENTINEL LYMPH NODE BIOPSY;  Surgeon: Rolm Bookbinder, MD;  Location: [place];  Service: General;  Laterality: Left;   PORT-A-CATH REMOVAL Right 06/15/2013   Procedure: REMOVAL PORT-A-CATH;  Surgeon: Rolm Bookbinder, MD;  Location: [place];  Service: General;  Laterality: Right;   PORTACATH PLACEMENT  01/27/2012   Procedure: INSERTION PORT-A-CATH;  Surgeon: Rolm Bookbinder, MD;  Location: [place];  Service: General;  Laterality: Right;  PORT PLACEMENT   REMOVAL OF STONES  02/07/2022   Procedure: REMOVAL OF STONES;  Surgeon: Jackquline Denmark, MD;  Location: [place];  Service: Gastroenterology;;   REMOVAL OF STONES  03/19/2022   Procedure: REMOVAL OF STONES;  Surgeon: Irving Copas., MD;  [facility];  Service: Gastroenterology;;   RIGHT/LEFT HEART CATH AND CORONARY ANGIOGRAPHY N/A 05/08/2021   Procedure: RIGHT/LEFT HEART CATH AND CORONARY ANGIOGRAPHY;  Surgeon: Jettie Booze, MD;  Location: [place] [facility];  Service: Cardiovascular;  Laterality: N/A;    SPHINCTEROTOMY  02/07/2022   Procedure: SPHINCTEROTOMY;  Surgeon: Jackquline Denmark, MD;  [facility];  Service: Gastroenterology;;   Bess Kinds CHOLANGIOSCOPY N/A 03/19/2022   Procedure: ZOXWRUEA CHOLANGIOSCOPY;  Surgeon: Irving Copas., MD;  [facility];  Service: Gastroenterology;  Laterality: N/A;   STENT REMOVAL  03/19/2022   Procedure: STENT REMOVAL;  Surgeon: Irving Copas., MD;  [facility];  Service: Gastroenterology;;   TOTAL HIP ARTHROPLASTY Right 11/14/2018   Procedure: REMOVAL OF INTRAMEDULLARY NAIL AND POSTERIOR TOTAL HIP ARTHROPLASTY;  Surgeon: Gaynelle Arabian, MD;  [facility];  Service: Orthopedics;  Laterality: Right;   TOTAL SHOULDER REPLACEMENT  2011   left   TUBAL LIGATION      Family History: Family History  Problem Relation Age of Onset   Prostate cancer Father    Breast cancer Other     Social History: Social History   Socioeconomic History   Marital status: Widowed    Spouse name: Not on file   Number of children: Not on file   Years of education: Not on file   Highest education level: Not on file  Occupational History   Occupation: retired  Tobacco Use   Smoking status: Never   Smokeless tobacco: Never  Vaping Use   Vaping Use: Never used  Substance and Sexual Activity   Alcohol use: No   Drug use: No   Sexual activity: Yes    Birth control/protection: Surgical    Comment: mensus age  13, 1st pregnancy 21, no hrt gg4,p3, 1 babay lived a few hours complications  Other Topics Concern   Not on file  Social History Narrative   Not on file   Social Determinants of Health   Financial Resource Strain: Not on file  Food Insecurity: No Food Insecurity (03/27/2022)   Hunger Vital Sign    Worried About Running Out of Food in the Last Year: Never true    Ran Out of Food in the Last Year: Never true  Transportation Needs: No Transportation Needs (03/27/2022)   PRAPARE - Radiographer, therapeutic (Medical): No    Lack of Transportation (Non-Medical): No  Physical Activity: Unknown (10/29/2018)   Exercise Vital Sign    Days of Exercise per Week: Patient refused    Minutes of Exercise per Session: Patient refused  Stress: Not on file  Social Connections: Unknown (10/29/2018)   Social Connection and Isolation Panel [NHANES]    Frequency of Communication with Friends and Family: Patient refused    Frequency of Social Gatherings with Friends and Family: Patient refused    Attends Religious Services: Patient refused    Active Member of Clubs or Organizations: Patient refused    Attends Archivist Meetings: Patient refused    Marital Status: Patient refused  Intimate Partner Violence: Not At Risk (03/27/2022)   Humiliation, Afraid, Rape, and Kick questionnaire    Fear of Current or Ex-Partner: No    Emotionally Abused: No    Physically Abused: No    Sexually Abused: No    Allergies: Patient has no known allergies.  Medications: I have reviewed the patient's current medications.  Vital Signs: Patient Vitals for the past 24 hrs:  BP Temp Temp src Pulse Resp SpO2 Height Weight  03/27/22 2128 (!) 148/67 98 F (36.7 C) Oral 89 16 94 % -- --  03/27/22 1835 -- -- -- -- -- 92 % -- --  03/27/22 1420 (!) 142/62 98.6 F (37 C) Oral 100 16 94 % -- --  03/27/22 0850 (!) 154/62 99.1 F (37.3 C) Oral 86 17 94 % -- --  03/27/22 0649 138/63 98.7 F (37.1 C) -- 81 17 96 % -- --  03/27/22 0113 -- -- -- -- 18 95 % '4\' 11"'$  (1.499 m) 58.4 kg  03/27/22 0104 (!) 158/67 98.2 F (36.8 C) Oral 97 17 90 % -- --  03/27/22 0030 (!) 139/59 -- -- 95 (!) 25 91 % -- --  03/26/22 2330 (!) 156/66 98.4 F (36.9 C) -- 97 19 99 % -- --  03/26/22 2300 (!) 158/69 -- -- 97 17 93 % -- --  03/26/22 2232 (!) 153/81 98.6 F (37 C) Oral 79 15 95 % -- --  03/26/22 2200 (!) 142/62 -- -- 85 17 93 % -- --    Radiology: CT L-SPINE NO CHARGE  Result Date: 03/26/2022 CLINICAL DATA:  Initial  evaluation for acute trauma, fall. EXAM: CT LUMBAR SPINE WITHOUT CONTRAST TECHNIQUE: Multidetector CT imaging of the lumbar spine was performed without intravenous contrast administration. Multiplanar CT image reconstructions were also generated. RADIATION DOSE REDUCTION: This exam was performed according to the departmental dose-optimization program which includes automated exposure control, adjustment of the mA and/or kV according to patient size and/or use of iterative reconstruction technique. COMPARISON:  Prior CTs from earlier the same day. FINDINGS: Segmentation: Standard. Lowest well-formed disc space labeled the L5-S1 level. Alignment: Trace retrolisthesis of T12 on L1 and L1 on L2, likely chronic  and degenerative. Alignment otherwise normal preservation of the normal lumbar lordosis. Vertebrae: Acute nondisplaced fracture of the right sacral ala. No visible extension through the sacral foramina. Acute fractures of the right superior and inferior pubic rami noted. No acute vertebral body fracture. Chronic height loss noted at T10. Underlying osteopenia. No worrisome osseous lesions. Paraspinal and other soft tissues: Paraspinous soft tissues better evaluated on corresponding CT of the abdomen and pelvis. No visible abnormality. Aortic atherosclerosis. Right total hip arthroplasty partially visualized. Disc levels: Underlying mild for age multilevel degenerative spondylosis, most pronounced at L4-5 and L5-S1. No high-grade spinal stenosis by CT. IMPRESSION: 1. Acute nondisplaced fracture of the right sacral ala. 2. Acute fractures of the right superior and inferior pubic rami. 3. No other acute traumatic injury within the lumbar spine. Aortic Atherosclerosis (ICD10-I70.0). Electronically Signed   By: Jeannine Boga M.D.   On: 03/26/2022 20:14   CT ABDOMEN PELVIS WO CONTRAST  Result Date: 03/26/2022 CLINICAL DATA:  Pelvis fracture.  Mechanical fall yesterday. EXAM: CT ABDOMEN AND PELVIS WITHOUT  CONTRAST TECHNIQUE: Multidetector CT imaging of the abdomen and pelvis was performed following the standard protocol without IV contrast. RADIATION DOSE REDUCTION: This exam was performed according to the departmental dose-optimization program which includes automated exposure control, adjustment of the mA and/or kV according to patient size and/or use of iterative reconstruction technique. COMPARISON:  Abdominopelvic CT a 03/16/2022 FINDINGS: Lower chest: Similar pleural thickening to recent prior. No acute basilar airspace disease. The heart is normal in size. Hepatobiliary: Interval removal of biliary stent. Increased pneumobilia likely related to recent ERCP. Grossly stable biliary prominence on this unenhanced exam. Air in the gallbladder again seen. No pericholecystic inflammation. Homogeneous hepatic attenuation without acute findings. Pancreas: Parenchymal atrophy. No ductal dilatation or inflammation. Spleen: Homogeneous attenuation, normal in size. No evidence of injury on this unenhanced exam. Adrenals/Urinary Tract: Normal adrenal glands. Bilateral renal parenchymal thinning. Bilateral renal cysts including left kidney peripelvic cysts. No specific imaging follow-up is recommended. No hydronephrosis. Unremarkable urinary bladder. Stomach/Bowel: Small hiatal hernia. No bowel obstruction or inflammation. Small bowel enteric sutures in the central abdomen with an adjacent patulous loop of small bowel. Moderate volume of colonic stool. Appendix is not definitively seen. Vascular/Lymphatic: Aortic atherosclerosis without aneurysm. No adenopathy. Reproductive: Normal for age uterine atrophy.  No adnexal mass. Other: No ascites or free fluid. No free air. Postsurgical change of the anterior abdominal wall. Musculoskeletal: Lumbar spine assessed on concurrent lumbar spine reformats, reported separately. Mildly comminuted and displaced fracture of the right superior pubic ramus at the puboacetabular junction.  Fracture does not extend to the right hip arthroplasty. Displaced and comminuted fracture of the right inferior pubic ramus. There is a nondisplaced right sacral fracture without definite involvement of the sacral foramen. The bones are diffusely under mineralized. Right hip arthroplasty without periprosthetic fracture. IMPRESSION: 1. Right superior and inferior pubic rami fractures, mildly comminuted and displaced. 2. Nondisplaced right sacral fracture without definite involvement of the sacral foramen. 3. Interval removal of biliary stent. Increased pneumobilia likely related to recent ERCP. Grossly stable biliary prominence on this unenhanced exam. 4. Additional stable chronic findings as described. Aortic Atherosclerosis (ICD10-I70.0). Electronically Signed   By: Keith Rake M.D.   On: 03/26/2022 19:57   DG Hip Unilat  With Pelvis 2-3 Views Right  Result Date: 03/26/2022 CLINICAL DATA:  Hip fracture. EXAM: DG HIP (WITH OR WITHOUT PELVIS) 2-3V RIGHT COMPARISON:  None Available. FINDINGS: Right total hip arthroplasty. Mildly displaced fractures of the right inferior  and superior pubic rami. No hip dislocation. Calcific atherosclerosis. Lower lumbar degenerative change. Scattered enthesopathy. IMPRESSION: Mildly displaced fractures of the right inferior and superior pubic rami. Electronically Signed   By: Margaretha Sheffield M.D.   On: 03/26/2022 16:42   DG ERCP  Result Date: 03/20/2022 CLINICAL DATA:  History of choledocholithiasis and biliary stent. EXAM: ERCP TECHNIQUE: Multiple spot images obtained with the fluoroscopic device and submitted for interpretation post-procedure. FLUOROSCOPY: Radiation Exposure Index (as provided by the fluoroscopic device): 82.23 mGy Kerma COMPARISON:  ERCP 02/07/2022 FINDINGS: Initial image demonstrates a nonmetallic biliary stent. Biliary stent was removed. Common bile duct was cannulated. Cholangiogram again demonstrated a severely dilated biliary system.  Sphincteroplasty was performed. Evidence for a balloon sweep. Placement of a device within the common bile duct most likely represents a Spyglass device. IMPRESSION: 1. Removal of biliary stent. 2. Persistently dilated biliary system. Evidence for sphincteroplasty and balloon sweep. Please refer to the procedure note. These images were submitted for radiologic interpretation only. Please see the procedural report for the amount of contrast utilized. Electronically Signed   By: Markus Daft M.D.   On: 03/20/2022 08:01   CT CORONARY MORPH W/CTA COR W/SCORE W/CA W/CM &/OR WO/CM  Addendum Date: 03/16/2022   ADDENDUM REPORT: 03/16/2022 15:26 CLINICAL DATA:  Aortic valve replacement (TAVR), pre-op eval EXAM: Cardiac TAVR CT TECHNIQUE: The patient was scanned on a Siemens Force 546 slice scanner. A 120 kV retrospective scan was triggered in the descending thoracic aorta at 111 HU's. Gantry rotation speed was 270 msecs and collimation was .9 mm. The 3D data set was reconstructed in 5% intervals of the R-R cycle. Systolic and diastolic phases were analyzed on a dedicated work station using MPR, MIP and VRT modes. The patient received 141m OMNIPAQUE IOHEXOL 350 MG/ML SOLN of contrast. FINDINGS: Aortic Valve: Tricuspid aortic valve. Severely reduced cusp separation. Moderately thickened, severely calcified aortic valve cusps. AV calcium score: 2283 Virtual Basal Annulus Measurements: Maximum/Minimum Diameter: 27.5 x 20.8 mm Perimeter: 76.3 mm Area: 439 mm2 Mild LVOT calcifications under NCC/LCC commissure. Membranous septal length: 4.5 mm Based on these measurements, the annulus would be suitable for a 26 mm Edward Sapien 3 valve. Alternatively, consider a 29 mm Medtronic Evolut Pro valve. Recommend structural heart team discussion for valve selection. Sinus of Valsalva Measurements: Non-coronary:  32 mm Right - coronary:  29 mm Left - coronary:  30 mm Sinus of Valsalva Height: Left: 23.6 mm Right: 22.5 mm Aorta: Short  segment common origin of the brachiocephalic and left common carotid arteries off the aortic arch. Sinotubular Junction:  28 mm Ascending Thoracic Aorta:  29 mm Aortic Arch:  25 mm Descending Thoracic Aorta:  25 mm Coronary Artery Height above Annulus: Left main: 19.5 mm Right coronary: 19.1 mm Coronary Arteries: Normal coronary origin. Right dominance. The study was performed without use of NTG and insufficient for plaque evaluation. Coronary artery calcium seen in 3 vessel distribution. Optimum Fluoroscopic Angle for Delivery: LAO 9 CAU 9 OTHER: Left atrial appendage: No thrombus. Mitral valve: Thickened leaflets, severe mitral annular calcifications. Pulmonary artery: Mildly dilated main and branch pulmonary arteries. Pulmonary veins: Normal anatomy. IMPRESSION: 1. Tricuspid aortic valve with severely reduced cusp excursion. Moderately thickened and severely calcified aortic valve cusps. 2.  Aortic valve calcium score: 2283 3. Annulus area: 439 mm2, suitable for 26 mm Sapien 3 valve. Mild LVOT calcifications. 4.  Sufficient coronary artery heights from annulus. 5.  Optimum Fluoroscopic Angle for Delivery: LAO 9 CAU 9 6.  Severe mitral annular calcification. Electronically Signed   By: Cherlynn Kaiser M.D.   On: 03/16/2022 15:26   Result Date: 03/16/2022 EXAM: OVER-READ INTERPRETATION  CT CHEST The following report is a limited chest CT over-read performed by radiologist Dr. Salvatore Marvel of [facility], [place] on 03/16/2022. This over-read does not include interpretation of cardiac or coronary anatomy or pathology. The cardiac CTA interpretation by the cardiologist is attached. COMPARISON:  02/06/2022 CT angiogram of the chest, abdomen and pelvis. FINDINGS: Please see the separate concurrent chest CT angiogram report for details. IMPRESSION: Please see the separate concurrent chest CT angiogram report for details. Electronically Signed: By: Ilona Sorrel M.D. On: 03/16/2022 13:54   CT ANGIO ABDOMEN PELVIS  W  &/OR WO CONTRAST  Result Date: 03/16/2022 CLINICAL DATA:  Aortic valve replacement (TAVR), pre-op eval. TAVR evaluation. Aortic valve stenosis. History of left breast cancer. * Tracking Code: BO * EXAM: CT ANGIOGRAPHY CHEST, ABDOMEN AND PELVIS TECHNIQUE: Multidetector CT imaging through the chest, abdomen and pelvis was performed using the standard protocol during bolus administration of intravenous contrast. Multiplanar reconstructed images and MIPs were obtained and reviewed to evaluate the vascular anatomy. RADIATION DOSE REDUCTION: This exam was performed according to the departmental dose-optimization program which includes automated exposure control, adjustment of the mA and/or kV according to patient size and/or use of iterative reconstruction technique. CONTRAST:  153m OMNIPAQUE IOHEXOL 350 MG/ML SOLN COMPARISON:  02/06/2022 CT angiogram of the chest, abdomen and pelvis. FINDINGS: CTA CHEST FINDINGS Cardiovascular: Borderline mild cardiomegaly. Diffuse thickening and coarse calcification of the aortic valve. Coarse mitral annular calcification. No significant pericardial effusion/thickening. Left anterior descending coronary atherosclerosis. Atherosclerotic nonaneurysmal thoracic aorta. Normal caliber pulmonary arteries. No central pulmonary emboli. Mediastinum/Nodes: No discrete thyroid nodules. Unremarkable esophagus. No pathologically enlarged axillary, mediastinal or hilar lymph nodes. Lungs/Pleura: No pneumothorax. No pleural effusion. No acute consolidative airspace disease, lung masses or significant pulmonary nodules. Stable sharply marginated bandlike consolidation in the anterior left upper lobe with associated volume loss and distortion, compatible with radiation fibrosis. Musculoskeletal: No aggressive appearing focal osseous lesions. Left shoulder arthroplasty. Marked thoracic spondylosis. Stable postsurgical changes from left mastectomy. CTA ABDOMEN AND PELVIS FINDINGS Hepatobiliary: Normal  liver with no liver mass. Scattered pneumobilia in nondependent gallbladder wall and throughout the common bile duct and intrahepatic bile ducts, with well-positioned CBD stent terminating in the descending duodenal lumen. Dilated CBD with diameter 17 mm, similar. Resolved gallbladder wall thickening and pericholecystic edema. Pancreas: Normal, with no mass or duct dilation. Spleen: Normal size. No mass. Adrenals/Urinary Tract: Normal adrenals. No hydronephrosis. Simple small parapelvic renal cysts in both kidneys, homogeneous hypodense 2.3 cm interpolar right renal cortical lesion (series 5/image 99) and additional smaller hypodense bilateral renal cortical lesions that are too small to characterize, for which no follow-up imaging is recommended. Nondistended bladder is obscured by streak artifact from right hip hardware and is grossly normal. Stomach/Bowel: Small hiatal hernia. Otherwise normal nondistended stomach. Enteroenterostomy again noted in the midline lower abdomen. No unexpected small bowel dilatation. No small bowel wall thickening. Mild sigmoid diverticulosis with no large bowel wall thickening or significant pericolonic fat stranding. Appendix not discretely visualized. Vascular/Lymphatic: Atherosclerotic nonaneurysmal abdominal aorta. No pathologically enlarged lymph nodes in the abdomen or pelvis. Reproductive: Grossly normal uterus.  No adnexal mass. Other: No pneumoperitoneum, ascites or focal fluid collection. Musculoskeletal: No aggressive appearing focal osseous lesions. Right total hip arthroplasty. Marked lumbar degenerative disc disease. VASCULAR MEASUREMENTS PERTINENT TO TAVR: AORTA: Minimal Aortic Diameter-13.3 x 13.2 mm Severity  of Aortic Calcification-moderate RIGHT PELVIS: Right Common Iliac Artery - Minimal Diameter-9.9 x 8.2 mm Tortuosity-mild Calcification-moderate Right External Iliac Artery - Minimal Diameter-6.6 x 6.6 mm Tortuosity-mild-to-moderate Calcification-none Right Common  Femoral Artery - Minimal Diameter-6.9 x 6.7 mm Tortuosity-mild Calcification-none LEFT PELVIS: Left Common Iliac Artery - Minimal Diameter-8.3 x 7.7 mm Tortuosity-mild Calcification-moderate Left External Iliac Artery - Minimal Diameter-6.9 x 6.8 mm Tortuosity-mild Calcification-none Left Common Femoral Artery - Minimal Diameter-6.5 x 6.3 mm Tortuosity-mild Calcification-mild Review of the MIP images confirms the above findings. IMPRESSION: 1. Vascular findings and measurements pertinent to potential TAVR procedure, as detailed. 2. Diffuse thickening and coarse calcification of the aortic valve, compatible with the reported history of aortic stenosis. 3. Borderline mild cardiomegaly. One vessel coronary atherosclerosis. 4. Resolved gallbladder wall thickening and pericholecystic edema. Well-positioned CBD stent. Similar biliary ductal dilatation (CBD diameter 17 mm). 5. Mild sigmoid diverticulosis. 6. Small hiatal hernia. 7.  Aortic Atherosclerosis (ICD10-I70.0). Electronically Signed   By: Ilona Sorrel M.D.   On: 03/16/2022 14:32   CT ANGIO CHEST AORTA W/CM & OR WO/CM  Result Date: 03/16/2022 CLINICAL DATA:  Aortic valve replacement (TAVR), pre-op eval. TAVR evaluation. Aortic valve stenosis. History of left breast cancer. * Tracking Code: BO * EXAM: CT ANGIOGRAPHY CHEST, ABDOMEN AND PELVIS TECHNIQUE: Multidetector CT imaging through the chest, abdomen and pelvis was performed using the standard protocol during bolus administration of intravenous contrast. Multiplanar reconstructed images and MIPs were obtained and reviewed to evaluate the vascular anatomy. RADIATION DOSE REDUCTION: This exam was performed according to the departmental dose-optimization program which includes automated exposure control, adjustment of the mA and/or kV according to patient size and/or use of iterative reconstruction technique. CONTRAST:  122m OMNIPAQUE IOHEXOL 350 MG/ML SOLN COMPARISON:  02/06/2022 CT angiogram of the chest,  abdomen and pelvis. FINDINGS: CTA CHEST FINDINGS Cardiovascular: Borderline mild cardiomegaly. Diffuse thickening and coarse calcification of the aortic valve. Coarse mitral annular calcification. No significant pericardial effusion/thickening. Left anterior descending coronary atherosclerosis. Atherosclerotic nonaneurysmal thoracic aorta. Normal caliber pulmonary arteries. No central pulmonary emboli. Mediastinum/Nodes: No discrete thyroid nodules. Unremarkable esophagus. No pathologically enlarged axillary, mediastinal or hilar lymph nodes. Lungs/Pleura: No pneumothorax. No pleural effusion. No acute consolidative airspace disease, lung masses or significant pulmonary nodules. Stable sharply marginated bandlike consolidation in the anterior left upper lobe with associated volume loss and distortion, compatible with radiation fibrosis. Musculoskeletal: No aggressive appearing focal osseous lesions. Left shoulder arthroplasty. Marked thoracic spondylosis. Stable postsurgical changes from left mastectomy. CTA ABDOMEN AND PELVIS FINDINGS Hepatobiliary: Normal liver with no liver mass. Scattered pneumobilia in nondependent gallbladder wall and throughout the common bile duct and intrahepatic bile ducts, with well-positioned CBD stent terminating in the descending duodenal lumen. Dilated CBD with diameter 17 mm, similar. Resolved gallbladder wall thickening and pericholecystic edema. Pancreas: Normal, with no mass or duct dilation. Spleen: Normal size. No mass. Adrenals/Urinary Tract: Normal adrenals. No hydronephrosis. Simple small parapelvic renal cysts in both kidneys, homogeneous hypodense 2.3 cm interpolar right renal cortical lesion (series 5/image 99) and additional smaller hypodense bilateral renal cortical lesions that are too small to characterize, for which no follow-up imaging is recommended. Nondistended bladder is obscured by streak artifact from right hip hardware and is grossly normal. Stomach/Bowel:  Small hiatal hernia. Otherwise normal nondistended stomach. Enteroenterostomy again noted in the midline lower abdomen. No unexpected small bowel dilatation. No small bowel wall thickening. Mild sigmoid diverticulosis with no large bowel wall thickening or significant pericolonic fat stranding. Appendix not discretely visualized. Vascular/Lymphatic: Atherosclerotic nonaneurysmal abdominal  aorta. No pathologically enlarged lymph nodes in the abdomen or pelvis. Reproductive: Grossly normal uterus.  No adnexal mass. Other: No pneumoperitoneum, ascites or focal fluid collection. Musculoskeletal: No aggressive appearing focal osseous lesions. Right total hip arthroplasty. Marked lumbar degenerative disc disease. VASCULAR MEASUREMENTS PERTINENT TO TAVR: AORTA: Minimal Aortic Diameter-13.3 x 13.2 mm Severity of Aortic Calcification-moderate RIGHT PELVIS: Right Common Iliac Artery - Minimal Diameter-9.9 x 8.2 mm Tortuosity-mild Calcification-moderate Right External Iliac Artery - Minimal Diameter-6.6 x 6.6 mm Tortuosity-mild-to-moderate Calcification-none Right Common Femoral Artery - Minimal Diameter-6.9 x 6.7 mm Tortuosity-mild Calcification-none LEFT PELVIS: Left Common Iliac Artery - Minimal Diameter-8.3 x 7.7 mm Tortuosity-mild Calcification-moderate Left External Iliac Artery - Minimal Diameter-6.9 x 6.8 mm Tortuosity-mild Calcification-none Left Common Femoral Artery - Minimal Diameter-6.5 x 6.3 mm Tortuosity-mild Calcification-mild Review of the MIP images confirms the above findings. IMPRESSION: 1. Vascular findings and measurements pertinent to potential TAVR procedure, as detailed. 2. Diffuse thickening and coarse calcification of the aortic valve, compatible with the reported history of aortic stenosis. 3. Borderline mild cardiomegaly. One vessel coronary atherosclerosis. 4. Resolved gallbladder wall thickening and pericholecystic edema. Well-positioned CBD stent. Similar biliary ductal dilatation (CBD diameter  17 mm). 5. Mild sigmoid diverticulosis. 6. Small hiatal hernia. 7.  Aortic Atherosclerosis (ICD10-I70.0). Electronically Signed   By: Ilona Sorrel M.D.   On: 03/16/2022 14:32    Labs: Recent Labs    03/26/22 2147 03/27/22 0349  WBC 7.6 6.8  RBC 2.81* 2.72*  HCT 26.7* 26.1*  PLT 144* 149*   Recent Labs    03/26/22 2147 03/27/22 0349  NA 141 140  K 4.2 4.0  CL 115* 115*  CO2 21* 21*  BUN 28* 26*  CREATININE 1.08* 0.98  GLUCOSE 111* 114*  CALCIUM 9.1 8.8*   Recent Labs    03/27/22 0349  INR 1.1    Review of Systems: ROS as detailed in HPI  Physical Exam: Body mass index is 26.01 kg/m.  Physical Exam   Gen: AAOx3, NAD Comfortable at rest  Bilateral Lower Extremity: No TTP HF/KE/KF/ADF/APF/EHL 5/5 SILT throughout DP, PT 2+ to palp CR < 2s   Assessment and Plan: Ortho consult for right pelvis fractures (acute right inferior/superior pubic rami fractures as well as acute right sacral alla fracture) sustained on 03/26/22  -history, exam and imaging reviewed at length with patient/family -reviewed case and imaging with ortho trauma colleagues as well -no acute surgical intervention -WBAT with walker and 1-2 person assist at all times -PT/OT -follow up as outpatient with Dr. Wynelle Link (she is an established patient)  Armond Hang, MD Orthopaedic Surgeon [facility] [phone]

## 2022-03-27 NOTE — Plan of Care (Signed)
  Problem: Clinical Measurements: Goal: Respiratory complications will improve Outcome: Progressing   Problem: Clinical Measurements: Goal: Cardiovascular complication will be avoided Outcome: Progressing   Problem: Elimination: Goal: Will not experience complications related to bowel motility Outcome: Progressing   Problem: Skin Integrity: Goal: Risk for impaired skin integrity will decrease Outcome: Progressing   Problem: Safety: Goal: Ability to remain free from injury will improve Outcome: Progressing   Problem: Pain Managment: Goal: General experience of comfort will improve Outcome: Progressing

## 2022-03-27 NOTE — Evaluation (Signed)
Physical Therapy Evaluation Patient Details Name: Brandy [name] MRN: [id number] DOB: 05-09-1933 Today's Date: 03/27/2022  History of Present Illness  Pt is an 88yo female presnting to [facility] ED from SNF on 03/26/22 after a mechnical fall while ambulating with RW; imaging showed acute R pubic ramii and sacral fxs. On-call [facility] Dr. Kathaleen Bury reports non-operative, WBAT with RW. PMH: HTN, HLD, anemia, hx of breast cancer s/p mastectomy & radiation, GERD, R-THA 2020, L total shoulder 2011.   Clinical Impression  Pt presents with the problems listed above and functional impairments below. Pt very pleasant and agreeable to be seen. Required mod assist +2 for bed mobility, min assist for transfers with RW. Pt attempted ambulation with RW but reporting 10/10 pain and inability to weightbear on RLE despite use of BUE to offload, directed pt to complete step pivot transfer, further mobility deferred. Positioned pt in recliner for comfort, pt requesting pain medication, RN notified. Discussed SNF-level therapies upon discharge and pt is agreeable. We will continue to follow acutely.       Recommendations for follow up therapy are one component of a multi-disciplinary discharge planning process, led by the attending physician.  Recommendations may be updated based on patient status, additional functional criteria and insurance authorization.  Follow Up Recommendations Skilled nursing-short term rehab (<3 hours/day) Can patient physically be transported by private vehicle: No    Assistance Recommended at Discharge Frequent or constant Supervision/Assistance  Patient can return home with the following  Help with stairs or ramp for entrance;Assist for transportation;Assistance with cooking/housework;A lot of help with bathing/dressing/bathroom;A lot of help with walking and/or transfers    Equipment Recommendations None recommended by PT (TBD @ SNF)  Recommendations for Other Services        Functional Status Assessment Patient has had a recent decline in their functional status and demonstrates the ability to make significant improvements in function in a reasonable and predictable amount of time.     Precautions / Restrictions Precautions Precautions: Fall Precaution Comments: recent fall, sacral fx, pubic rami fx Restrictions Weight Bearing Restrictions: No RLE Weight Bearing: Weight bearing as tolerated Other Position/Activity Restrictions: Via chart review, MD note, and nursing order      Mobility  Bed Mobility Overal bed mobility: Needs Assistance Bed Mobility: Supine to Sit     Supine to sit: Mod assist, +2 for physical assistance, HOB elevated, +2 for safety/equipment     General bed mobility comments: Pt required mod assist +2 for lift assist of trunk and bringing BLE off bed, use of chuck pad to assist pt to EOB.    Transfers Overall transfer level: Needs assistance Equipment used: Rolling walker (2 wheels) Transfers: Sit to/from Stand, Bed to chair/wheelchair/BSC Sit to Stand: Min assist, +2 safety/equipment   Step pivot transfers: Min assist, +2 safety/equipment       General transfer comment: Pt required min assist for lift assist to come to upright standing, pt in very forward flexed posture, reports inability to stand/step on RLE. Pt attempted forward ambulation with use of BUE on RW for support but unable to WB on RLE secondary to pain. Directed pt to complete step pivot transfer to recliner, pt able to perform sidestep with min assist for steadying, min assist to lower to seated position and use of chuck pad min assist +2 for scooting to upright portion of recliner. Placed ice, pt requesting pain medication secondary to 10/10 pain in hip and sacrum, further mobility deferred    Ambulation/Gait  General Gait Details: unable secondary to pain  Stairs            Wheelchair Mobility    Modified Rankin (Stroke Patients  Only)       Balance Overall balance assessment: Needs assistance Sitting-balance support: Feet supported, No upper extremity supported Sitting balance-Leahy Scale: Good     Standing balance support: Reliant on assistive device for balance, During functional activity, Bilateral upper extremity supported Standing balance-Leahy Scale: Poor Standing balance comment: Pt reports inability to stand on RLE                             Pertinent Vitals/Pain Pain Assessment Pain Assessment: 0-10 Pain Score: 9  Pain Location: Right hip and right upper trap Pain Descriptors / Indicators: Guarding, Discomfort, Crying, Spasm Pain Intervention(s): Limited activity within patient's tolerance, Monitored during session, Repositioned, Patient requesting pain meds-RN notified, Ice applied    Home Living Family/patient expects to be discharged to:: Skilled nursing facility Living Arrangements: Children                      Prior Function Prior Level of Function : Needs assist;History of Falls (last six months) (Pt fell while ambulating with rollator at [facility])       Physical Assist : Mobility (physical);ADLs (physical)   ADLs (physical): Bathing;Dressing;Toileting Mobility Comments: Uses rollator base line ADLs Comments: Assist with showering     Hand Dominance   Dominant Hand: Right    Extremity/Trunk Assessment   Upper Extremity Assessment Upper Extremity Assessment: Overall WFL for tasks assessed    Lower Extremity Assessment Lower Extremity Assessment: Generalized weakness;RLE deficits/detail;LLE deficits/detail RLE Deficits / Details: ROM functional though very limited DF, MMT not tested secondary to pain RLE: Unable to fully assess due to pain LLE Deficits / Details: ROM functional though very limited DF, MMT not tested secondary to pain LLE: Unable to fully assess due to pain    Cervical / Trunk Assessment Cervical / Trunk Assessment: Kyphotic;Other  exceptions Cervical / Trunk Exceptions: Pt has very tight upper trapezius muscles bilaterally  Communication   Communication: HOH  Cognition Arousal/Alertness: Awake/alert Behavior During Therapy: WFL for tasks assessed/performed Overall Cognitive Status: Within Functional Limits for tasks assessed                                          General Comments      Exercises     Assessment/Plan    PT Assessment Patient needs continued PT services  PT Problem List Decreased strength;Decreased range of motion;Decreased activity tolerance;Decreased balance;Decreased mobility;Decreased coordination;Pain       PT Treatment Interventions DME instruction;Gait training;Functional mobility training;Therapeutic activities;Therapeutic exercise;Balance training;Neuromuscular re-education;Patient/family education    PT Goals (Current goals can be found in the Care Plan section)  Acute Rehab PT Goals Patient Stated Goal: To walk without pain PT Goal Formulation: With patient Time For Goal Achievement: 04/10/22 Potential to Achieve Goals: Good    Frequency Min 2X/week     Co-evaluation               AM-PAC PT "6 Clicks" Mobility  Outcome Measure Help needed turning from your back to your side while in a flat bed without using bedrails?: A Lot Help needed moving from lying on your back to sitting on the side of a flat bed  without using bedrails?: A Lot Help needed moving to and from a bed to a chair (including a wheelchair)?: A Lot Help needed standing up from a chair using your arms (e.g., wheelchair or bedside chair)?: A Lot Help needed to walk in hospital room?: A Lot Help needed climbing 3-5 steps with a railing? : Total 6 Click Score: 11    End of Session Equipment Utilized During Treatment: Gait belt Activity Tolerance: Patient limited by pain Patient left: in chair;with call bell/phone within reach;with chair alarm set;with SCD's reapplied Nurse  Communication: Mobility status PT Visit Diagnosis: Pain;Difficulty in walking, not elsewhere classified (R26.2);History of falling (Z91.81) Pain - Right/Left: Right Pain - part of body: Hip    Time: 8719-5974 PT Time Calculation (min) (ACUTE ONLY): 19 min   Charges:   PT Evaluation $PT Eval Low Complexity: [place], PT, DPT [facility] Rehabilitation Department Office: [phone]  Coolidge Breeze 03/27/2022, 5:45 PM

## 2022-03-28 DIAGNOSIS — E860 Dehydration: Secondary | ICD-10-CM | POA: Diagnosis not present

## 2022-03-28 DIAGNOSIS — S32591A Other specified fracture of right pubis, initial encounter for closed fracture: Secondary | ICD-10-CM | POA: Diagnosis not present

## 2022-03-28 DIAGNOSIS — I10 Essential (primary) hypertension: Secondary | ICD-10-CM | POA: Diagnosis not present

## 2022-03-28 DIAGNOSIS — D509 Iron deficiency anemia, unspecified: Secondary | ICD-10-CM | POA: Diagnosis not present

## 2022-03-28 LAB — CBC
HCT: 27.2 % — ABNORMAL LOW (ref 36.0–46.0)
Hemoglobin: 8.4 g/dL — ABNORMAL LOW (ref 12.0–15.0)
MCH: 29.9 pg (ref 26.0–34.0)
MCHC: 30.9 g/dL (ref 30.0–36.0)
MCV: 96.8 fL (ref 80.0–100.0)
Platelets: 157 10*3/uL (ref 150–400)
RBC: 2.81 MIL/uL — ABNORMAL LOW (ref 3.87–5.11)
RDW: 15.5 % (ref 11.5–15.5)
WBC: 7.7 10*3/uL (ref 4.0–10.5)
nRBC: 0 % (ref 0.0–0.2)

## 2022-03-28 LAB — BASIC METABOLIC PANEL
Anion gap: 5 (ref 5–15)
BUN: 26 mg/dL — ABNORMAL HIGH (ref 8–23)
CO2: 24 mmol/L (ref 22–32)
Calcium: 8.8 mg/dL — ABNORMAL LOW (ref 8.9–10.3)
Chloride: 114 mmol/L — ABNORMAL HIGH (ref 98–111)
Creatinine, Ser: 1.06 mg/dL — ABNORMAL HIGH (ref 0.44–1.00)
GFR, Estimated: 51 mL/min — ABNORMAL LOW (ref >60)
Glucose, Bld: 120 mg/dL — ABNORMAL HIGH (ref 70–99)
Potassium: 4.3 mmol/L (ref 3.5–5.1)
Sodium: 143 mmol/L (ref 135–145)

## 2022-03-28 NOTE — NC FL2 (Signed)
[place] MEDICAID FL2 LEVEL OF CARE SCREENING TOOL     IDENTIFICATION  Patient Name: Brandy [name] Birthdate: 02-11-1933 Sex: female Admission Date (Current Location): 03/26/2022  [facility] and [place] Number:  Herbalist and Address:  [facility],  [address], [place]      Provider Number: [id number]  Attending Physician Name and Address:  Geradine Girt, DO  Relative Name and Phone Number:  Greggory,Stacey Daughter [phone]    Current Level of Care: Hospital Recommended Level of Care: [place] Prior Approval Number:    Date Approved/Denied:   PASRR Number: [id number]  Discharge Plan: SNF    Current Diagnoses: Patient Active Problem List   Diagnosis Date Noted   Fall at home, initial encounter 03/27/2022   Dehydration 03/27/2022   Sacral fracture, closed ([place])    Pubic ramus fracture ([place]) 03/26/2022   Splenic artery aneurysm - 7 mm 02/09/2022   Cholangitis due to bile duct calculus with obstruction 02/09/2022   AKI (acute kidney injury) ([place]) 02/09/2022   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 02/09/2022   History of breast cancer 02/07/2022   History of partial gastrectomy 02/07/2022   Normocytic anemia 01/16/2022   Syncope and collapse    PNA (pneumonia) 05/06/2021   Acute on chronic respiratory failure with hypoxemia ([place]) 05/05/2021   Hip fracture ([place]) 11/11/2018   Acute respiratory failure with hypoxia ([place]) 10/29/2018   S/P right hip fracture 10/24/2018   Acute on chronic diastolic CHF (congestive heart failure) ([place]) 01/19/2017   HCAP (healthcare-associated pneumonia) 12/11/2016   S/P exploratory laparotomy 11/17/2016   Small bowel obstruction ([place]) 11/17/2016   Pre-operative cardiovascular examination    Incarcerated incisional hernia s/p SB resection & repair 11/17/2016 11/15/2016   Acute renal failure (ARF) ([place]) 11/15/2016   Hyperglycemia 11/15/2016   Asthma without status  asthmaticus 09/24/2016   Carpal tunnel syndrome 09/24/2016   Asthma 09/24/2016   Dark stools 09/24/2016   Degenerative arthritis of lumbar spine 09/24/2016   DJD (degenerative joint disease), cervical 09/24/2016   Dyspnea on exertion 09/24/2016   Generalized anxiety disorder 09/24/2016   Lumbosacral spondylosis without myelopathy 09/24/2016   Morbid (severe) obesity due to excess calories ([place]) 09/24/2016   Pernicious anemia 09/24/2016   Pure hypercholesterolemia 09/24/2016   Right knee pain 09/24/2016   Sleep disorder 09/24/2016   Vitamin B 12 deficiency 09/24/2016   Vitamin D deficiency 09/24/2016   Gastric ulcer    Chronic iron deficiency anemia    Abdominal pain, chronic, epigastric    Acute gastric ulcer without hemorrhage or perforation    Duodenal ulcer    Closed fracture of nasal septum 08/27/2015   Closed fracture of zygomatic arch ([place]) 08/27/2015   Protein-calorie malnutrition, severe ([place]) 12/18/2013   Choledocholithiasis 12/17/2013   Sepsis ([place]) 12/17/2013   Osteopenia 11/17/2012   Aortic valve stenosis 10/17/2012   Hx of radiation therapy    Depression    History of kidney stones    Urinary urgency    Gastroesophageal reflux disease without esophagitis    Hyperlipidemia    Essential hypertension    Bronchitis    Arthritis    Dysphagia    H/O hiatal hernia    Primary cancer of upper outer quadrant of left female breast ([place]) 12/01/2011    Orientation RESPIRATION BLADDER Height & Weight     Self, Time, Situation, Place  Normal Continent Weight: 133 lb 13.1 oz (60.7 kg) Height:  '4\' 11"'$  (149.9 cm)  BEHAVIORAL SYMPTOMS/MOOD NEUROLOGICAL BOWEL NUTRITION STATUS      Continent Diet (Regular diet)  AMBULATORY STATUS COMMUNICATION OF NEEDS Skin   Limited Assist Verbally Normal                       Personal Care Assistance Level of Assistance  Bathing, Feeding, Dressing Bathing Assistance: Limited assistance Feeding assistance: Independent Dressing  Assistance: Limited assistance     Functional Limitations Info  Sight, Hearing, Speech Sight Info: Adequate Hearing Info: Adequate Speech Info: Adequate    SPECIAL CARE FACTORS FREQUENCY  PT (By licensed PT), OT (By licensed OT)     PT Frequency: Minimum 5x a week OT Frequency: Minimum 5x a week            Contractures Contractures Info: Not present    Additional Factors Info  Code Status, Allergies Code Status Info: DNR Allergies Info: NKA           Current Medications (03/28/2022):  This is the current hospital active medication list Current Facility-Administered Medications  Medication Dose Route Frequency Provider Last Rate Last Admin   acetaminophen (TYLENOL) tablet 1,000 mg  1,000 mg Oral Q6H PRN Eulogio Bear U, DO   1,000 mg at 03/28/22 1821   atorvastatin (LIPITOR) tablet 40 mg  40 mg Oral QHS Vann, Jessica U, DO   40 mg at 03/27/22 2210   DULoxetine (CYMBALTA) DR capsule 40 mg  40 mg Oral Daily Eulogio Bear U, DO   40 mg at 03/28/22 0819   fentaNYL (SUBLIMAZE) injection 25 mcg  25 mcg Intravenous Q2H PRN Howerter, Justin B, DO       gabapentin (NEURONTIN) capsule 600 mg  600 mg Oral QHS Vann, Jessica U, DO   600 mg at 03/27/22 2210   naloxone (NARCAN) injection 0.4 mg  0.4 mg Intravenous PRN Howerter, Justin B, DO       ondansetron (ZOFRAN) injection 4 mg  4 mg Intravenous Q6H PRN Howerter, Justin B, DO   4 mg at 03/26/22 2336   pantoprazole (PROTONIX) EC tablet 40 mg  40 mg Oral Daily Eulogio Bear U, DO   40 mg at 03/28/22 5465   rOPINIRole (REQUIP) tablet 0.25 mg  0.25 mg Oral QHS Vann, Jessica U, DO   0.25 mg at 03/27/22 2209   traMADol (ULTRAM) tablet 50 mg  50 mg Oral QID PRN Howerter, Justin B, DO   50 mg at 03/28/22 1558   Facility-Administered Medications Ordered in Other Encounters  Medication Dose Route Frequency Provider Last Rate Last Admin   sodium chloride 0.9 % injection 10 mL  10 mL Intracatheter PRN Marcy Panning, MD   10 mL at 03/14/12 1628      Discharge Medications: Please see discharge summary for a list of discharge medications.  Relevant Imaging Results:  Relevant Lab Results:   Additional Information SSN [id number]  Ross Ludwig, LCSW

## 2022-03-28 NOTE — Evaluation (Signed)
Occupational Therapy Evaluation Patient Details Name: Brandy [name] MRN: [id number] DOB: November 03, 1932 Today's Date: 03/28/2022   History of Present Illness Patient is an 88yo female that presents with HTN, HLD, anemia, hx of breast cancer s/p mastectomy & radiation, GERD, R total shoulder arthroplasty, L shoulder replacement, pubic ramus fracture   Clinical Impression   Brandy [name] is an 88 year old female that presents with above medical history.  Prior to hospitalization patient MOD I for ADLS/IADLs, and lived home alone with intermittent assistance from daughters. Patient now presents Mod A for supine to sit and Min A for sit to stand. Patient Max A for LB dressing/bathing, and Mod A for toileting. Patient's functional mobility is limited by pain, and could only transfer to recliner. Recommend short term rehab prior to return home to improve functional status.        Recommendations for follow up therapy are one component of a multi-disciplinary discharge planning process, led by the attending physician.  Recommendations may be updated based on patient status, additional functional criteria and insurance authorization.   Follow Up Recommendations  Skilled nursing-short term rehab (<3 hours/day)    Assistance Recommended at Discharge Frequent or constant Supervision/Assistance  Patient can return home with the following A lot of help with bathing/dressing/bathroom;Assist for transportation;Direct supervision/assist for financial management;Assistance with cooking/housework;Help with stairs or ramp for entrance;Direct supervision/assist for medications management    Functional Status Assessment  Patient has had a recent decline in their functional status and demonstrates the ability to make significant improvements in function in a reasonable and predictable amount of time.  Equipment Recommendations   (defer)    Recommendations for Other Services       Precautions /  Restrictions Precautions Precautions: Fall Precaution Comments: recent fall, sacral fx, pubic rami fx Restrictions Weight Bearing Restrictions: No RLE Weight Bearing: Weight bearing as tolerated (with walker)      Mobility Bed Mobility Overal bed mobility: Needs Assistance Bed Mobility: Supine to Sit     Supine to sit: Mod assist, HOB elevated          Transfers Overall transfer level: Needs assistance Equipment used: Rolling walker (2 wheels) Transfers: Sit to/from Stand Sit to Stand: Min assist                  Balance Overall balance assessment: Mild deficits observed, not formally tested                                         ADL either performed or assessed with clinical judgement   ADL Overall ADL's : Needs assistance/impaired Eating/Feeding: Sitting;Modified independent   Grooming: Sitting;Modified independent   Upper Body Bathing: Sitting;Minimal assistance   Lower Body Bathing: Maximal assistance;Sitting/lateral leans   Upper Body Dressing : Sitting;Minimal assistance   Lower Body Dressing: Maximal assistance;Sit to/from stand   Toilet Transfer: Rolling walker (2 wheels);Minimal assistance;Regular Museum/gallery exhibitions officer and Hygiene: Moderate assistance;Sit to/from stand               Vision   Vision Assessment?: No apparent visual deficits     Perception     Praxis      Pertinent Vitals/Pain Pain Assessment Pain Assessment: Faces Faces Pain Scale: Hurts even more Pain Location: Right hip Pain Descriptors / Indicators: Guarding, Discomfort     Hand Dominance Right   Extremity/Trunk Assessment Upper Extremity  Assessment Upper Extremity Assessment: RUE deficits/detail;LUE deficits/detail RUE Deficits / Details: decreased shoulder ROM RUE Sensation: WNL RUE Coordination: WNL LUE Deficits / Details: decreased shoulder ROM LUE Sensation: WNL LUE Coordination: WNL   Lower Extremity  Assessment Lower Extremity Assessment: Defer to PT evaluation       Communication Communication Communication: No difficulties   Cognition Arousal/Alertness: Awake/alert Behavior During Therapy: WFL for tasks assessed/performed Overall Cognitive Status: Within Functional Limits for tasks assessed                                       General Comments       Exercises     Shoulder Instructions      Home Living Family/patient expects to be discharged to:: Skilled nursing facility Living Arrangements: Alone Available Help at Discharge: [place] Type of Home: House Home Access: Ramped entrance     Home Layout: One level     Bathroom Shower/Tub: Hospital doctor Toilet: Handicapped height     Home Equipment: Advice worker (2 wheels);Rollator (4 wheels);Cane - single point;Grab bars - tub/shower          Prior Functioning/Environment Prior Level of Function : Independent/Modified Independent             Mobility Comments: Uses walker at base line          OT Problem List: Decreased strength;Pain      OT Treatment/Interventions: Self-care/ADL training;Therapeutic exercise;Patient/family education;DME and/or AE instruction    OT Goals(Current goals can be found in the care plan section) Acute Rehab OT Goals Patient Stated Goal: To get better OT Goal Formulation: With patient Time For Goal Achievement: 04/11/22 Potential to Achieve Goals: Fair ADL Goals Pt Will Perform Upper Body Bathing: with modified independence Pt Will Transfer to Toilet: with modified independence Additional ADL Goal #1: Patient will stand at sink to perform grooming tasking in standing with min guard  OT Frequency: Min 2X/week    Co-evaluation              AM-PAC OT "6 Clicks" Daily Activity     Outcome Measure Help from another person eating meals?: None Help from another person taking care of personal grooming?:  None Help from another person toileting, which includes using toliet, bedpan, or urinal?: A Lot Help from another person bathing (including washing, rinsing, drying)?: A Lot Help from another person to put on and taking off regular upper body clothing?: A Little Help from another person to put on and taking off regular lower body clothing?: A Lot 6 Click Score: 17   End of Session Equipment Utilized During Treatment: Gait belt;Rolling walker (2 wheels)  Activity Tolerance: Patient tolerated treatment well Patient left: in chair;with chair alarm set;with call bell/phone within reach  OT Visit Diagnosis: Unsteadiness on feet (R26.81);Repeated falls (R29.6);Muscle weakness (generalized) (M62.81);Pain;Other abnormalities of gait and mobility (R26.89) Pain - Right/Left: Right Pain - part of body: Hip                Time: 4193-7902 OT Time Calculation (min): 28 min Charges:  OT General Charges $OT Visit: 1 Visit OT Evaluation $OT Eval Low Complexity: 1 Low OT Treatments $Self Care/Home Management : 8-22 mins  Charlann Lange, OTS Acute rehab services   Charlann Lange 03/28/2022, 4:15 PM

## 2022-03-28 NOTE — Progress Notes (Signed)
PROGRESS NOTE    Brandy [name]  HYW:[id number] DOB: 1933-02-21 DOA: 03/26/2022 PCP: Kathyrn Lass, MD    Brief Narrative:  Brandy [name] is a 88 y.o. female with medical history significant for essential hypertension, hyperlipidemia, chronic iron deficiency anemia with baseline hemoglobin range 8-10, who is admitted to [facility] on 03/26/2022 with acute right inferior/superior pubic rami fractures as well as acute right sacral alla fracture after presenting from HOME to [facility] ED complaining of right hip and acute right low back pain following ground-level mechanical fall.  Per orthopedics non-operative-- WBAT with walker.  Assessment and Plan:   Acute right inferior/superior pubic rami fracture and acute fracture of right sacral ala from a ground level fall -on-call [facility] (Dr. Pasty Arch, can WBAT with walker -pain control -PT/OT - will need SNF placement  Depression:  -resume home meds      GERD:  -resume PPO    Essential Hypertension:  -resume home meds    Hyperlipidemia:  -outpatient follow up    Chronic iron deficiency anemia:  -outpatient follow up          DVT prophylaxis: SCDs Start: 03/26/22 2310    Code Status: DNR   Disposition Plan:  Level of care: Telemetry Status is: Observation The patient will require care spanning > 2 midnights and should be moved to inpatient because: pain control, PT/OT  Needs SNF    Consultants:  ortho   Subjective: Slept well, pain improved  Objective: Vitals:   03/27/22 1420 03/27/22 1835 03/27/22 2128 03/28/22 0500  BP: (!) 142/62  (!) 148/67   Pulse: 100  89   Resp: 16  16   Temp: 98.6 F (37 C)  98 F (36.7 C)   TempSrc: Oral  Oral   SpO2: 94% 92% 94%   Weight:    60.7 kg  Height:        Intake/Output Summary (Last 24 hours) at 03/28/2022 1044 Last data filed at 03/28/2022 0900 Gross per 24 hour  Intake 690.25 ml  Output 800 ml  Net -109.75 ml   Filed Weights   03/27/22  0113 03/28/22 0500  Weight: 58.4 kg 60.7 kg    Examination:   General: Appearance:     Overweight female in no acute distress     Lungs:     respirations unlabored  Heart:    Normal heart rate. Normal rhythm.    MS:   All extremities are intact.   Neurologic:   Awake, alert       Data Reviewed: I have personally reviewed following labs and imaging studies  CBC: Recent Labs  Lab 03/26/22 2147 03/27/22 0349 03/28/22 0323  WBC 7.6 6.8 7.7  NEUTROABS  --  4.3  --   HGB 8.4* 8.1* 8.4*  HCT 26.7* 26.1* 27.2*  MCV 95.0 96.0 96.8  PLT 144* 149* 485   Basic Metabolic Panel: Recent Labs  Lab 03/26/22 2147 03/27/22 0349 03/28/22 0323  NA 141 140 143  K 4.2 4.0 4.3  CL 115* 115* 114*  CO2 21* 21* 24  GLUCOSE 111* 114* 120*  BUN 28* 26* 26*  CREATININE 1.08* 0.98 1.06*  CALCIUM 9.1 8.8* 8.8*  MG 2.0 2.0  --    GFR: Estimated Creatinine Clearance: 29.6 mL/min (A) (by C-G formula based on SCr of 1.06 mg/dL (H)). Liver Function Tests: Recent Labs  Lab 03/27/22 0349  AST 21  ALT 22  ALKPHOS 67  BILITOT 0.6  PROT 6.1*  ALBUMIN 3.0*  No results for input(s): "LIPASE", "AMYLASE" in the last 168 hours. No results for input(s): "AMMONIA" in the last 168 hours. Coagulation Profile: Recent Labs  Lab 03/27/22 0349  INR 1.1   Cardiac Enzymes: No results for input(s): "CKTOTAL", "CKMB", "CKMBINDEX", "TROPONINI" in the last 168 hours. BNP (last 3 results) No results for input(s): "PROBNP" in the last 8760 hours. HbA1C: No results for input(s): "HGBA1C" in the last 72 hours. CBG: No results for input(s): "GLUCAP" in the last 168 hours. Lipid Profile: No results for input(s): "CHOL", "HDL", "LDLCALC", "TRIG", "CHOLHDL", "LDLDIRECT" in the last 72 hours. Thyroid Function Tests: No results for input(s): "TSH", "T4TOTAL", "FREET4", "T3FREE", "THYROIDAB" in the last 72 hours. Anemia Panel: No results for input(s): "VITAMINB12", "FOLATE", "FERRITIN", "TIBC", "IRON",  "RETICCTPCT" in the last 72 hours. Sepsis Labs: No results for input(s): "PROCALCITON", "LATICACIDVEN" in the last 168 hours.  Recent Results (from the past 240 hour(s))  Surgical pcr screen     Status: None   Collection Time: 03/27/22 10:58 AM   Specimen: Nasal Mucosa; Nasal Swab  Result Value Ref Range Status   MRSA, PCR NEGATIVE NEGATIVE Final   Staphylococcus aureus NEGATIVE NEGATIVE Final    Comment: (NOTE) The Xpert SA Assay (FDA approved for NASAL specimens in patients 1 years of age and older), is one component of a comprehensive surveillance program. It is not intended to diagnose infection nor to guide or monitor treatment. Performed at [facility], [place] [address] [place], [place] [address]          Radiology Studies: CT L-SPINE NO CHARGE  Result Date: 03/26/2022 CLINICAL DATA:  Initial evaluation for acute trauma, fall. EXAM: CT LUMBAR SPINE WITHOUT CONTRAST TECHNIQUE: Multidetector CT imaging of the lumbar spine was performed without intravenous contrast administration. Multiplanar CT image reconstructions were also generated. RADIATION DOSE REDUCTION: This exam was performed according to the departmental dose-optimization program which includes automated exposure control, adjustment of the mA and/or kV according to patient size and/or use of iterative reconstruction technique. COMPARISON:  Prior CTs from earlier the same day. FINDINGS: Segmentation: Standard. Lowest well-formed disc space labeled the L5-S1 level. Alignment: Trace retrolisthesis of T12 on L1 and L1 on L2, likely chronic and degenerative. Alignment otherwise normal preservation of the normal lumbar lordosis. Vertebrae: Acute nondisplaced fracture of the right sacral ala. No visible extension through the sacral foramina. Acute fractures of the right superior and inferior pubic rami noted. No acute vertebral body fracture. Chronic height loss noted at T10. Underlying osteopenia. No worrisome  osseous lesions. Paraspinal and other soft tissues: Paraspinous soft tissues better evaluated on corresponding CT of the abdomen and pelvis. No visible abnormality. Aortic atherosclerosis. Right total hip arthroplasty partially visualized. Disc levels: Underlying mild for age multilevel degenerative spondylosis, most pronounced at L4-5 and L5-S1. No high-grade spinal stenosis by CT. IMPRESSION: 1. Acute nondisplaced fracture of the right sacral ala. 2. Acute fractures of the right superior and inferior pubic rami. 3. No other acute traumatic injury within the lumbar spine. Aortic Atherosclerosis (ICD10-I70.0). Electronically Signed   By: Jeannine Boga M.D.   On: 03/26/2022 20:14   CT ABDOMEN PELVIS WO CONTRAST  Result Date: 03/26/2022 CLINICAL DATA:  Pelvis fracture.  Mechanical fall yesterday. EXAM: CT ABDOMEN AND PELVIS WITHOUT CONTRAST TECHNIQUE: Multidetector CT imaging of the abdomen and pelvis was performed following the standard protocol without IV contrast. RADIATION DOSE REDUCTION: This exam was performed according to the departmental dose-optimization program which includes automated exposure control, adjustment of the  mA and/or kV according to patient size and/or use of iterative reconstruction technique. COMPARISON:  Abdominopelvic CT a 03/16/2022 FINDINGS: Lower chest: Similar pleural thickening to recent prior. No acute basilar airspace disease. The heart is normal in size. Hepatobiliary: Interval removal of biliary stent. Increased pneumobilia likely related to recent ERCP. Grossly stable biliary prominence on this unenhanced exam. Air in the gallbladder again seen. No pericholecystic inflammation. Homogeneous hepatic attenuation without acute findings. Pancreas: Parenchymal atrophy. No ductal dilatation or inflammation. Spleen: Homogeneous attenuation, normal in size. No evidence of injury on this unenhanced exam. Adrenals/Urinary Tract: Normal adrenal glands. Bilateral renal parenchymal  thinning. Bilateral renal cysts including left kidney peripelvic cysts. No specific imaging follow-up is recommended. No hydronephrosis. Unremarkable urinary bladder. Stomach/Bowel: Small hiatal hernia. No bowel obstruction or inflammation. Small bowel enteric sutures in the central abdomen with an adjacent patulous loop of small bowel. Moderate volume of colonic stool. Appendix is not definitively seen. Vascular/Lymphatic: Aortic atherosclerosis without aneurysm. No adenopathy. Reproductive: Normal for age uterine atrophy.  No adnexal mass. Other: No ascites or free fluid. No free air. Postsurgical change of the anterior abdominal wall. Musculoskeletal: Lumbar spine assessed on concurrent lumbar spine reformats, reported separately. Mildly comminuted and displaced fracture of the right superior pubic ramus at the puboacetabular junction. Fracture does not extend to the right hip arthroplasty. Displaced and comminuted fracture of the right inferior pubic ramus. There is a nondisplaced right sacral fracture without definite involvement of the sacral foramen. The bones are diffusely under mineralized. Right hip arthroplasty without periprosthetic fracture. IMPRESSION: 1. Right superior and inferior pubic rami fractures, mildly comminuted and displaced. 2. Nondisplaced right sacral fracture without definite involvement of the sacral foramen. 3. Interval removal of biliary stent. Increased pneumobilia likely related to recent ERCP. Grossly stable biliary prominence on this unenhanced exam. 4. Additional stable chronic findings as described. Aortic Atherosclerosis (ICD10-I70.0). Electronically Signed   By: Keith Rake M.D.   On: 03/26/2022 19:57   DG Hip Unilat  With Pelvis 2-3 Views Right  Result Date: 03/26/2022 CLINICAL DATA:  Hip fracture. EXAM: DG HIP (WITH OR WITHOUT PELVIS) 2-3V RIGHT COMPARISON:  None Available. FINDINGS: Right total hip arthroplasty. Mildly displaced fractures of the right inferior and  superior pubic rami. No hip dislocation. Calcific atherosclerosis. Lower lumbar degenerative change. Scattered enthesopathy. IMPRESSION: Mildly displaced fractures of the right inferior and superior pubic rami. Electronically Signed   By: Margaretha Sheffield M.D.   On: 03/26/2022 16:42           LOS: 0 days    Time spent: 45 minutes spent on chart review, discussion with nursing staff, consultants, updating family and interview/physical exam; more than 50% of that time was spent in counseling and/or coordination of care.    Geradine Girt, DO [facility] Available via Epic secure chat 7am-7pm After these hours, please refer to coverage provider listed on [email or web address] 03/28/2022, 10:44 AM

## 2022-03-28 NOTE — TOC Progression Note (Addendum)
Transition of Care [facility]) - Progression Note    Patient Details  Name: Brandy [name] MRN: [id number] Date of Birth: May 08, 1933  [facility] CM/SW Contact  Ross Ludwig, [place] Phone Number: 03/28/2022, 7:52 PM  Clinical Narrative:     CSW was informed that patient and family are interested in SNF placement.  CSW to work on SNF bed placement for short term rehab.  Patient has been faxed out awaiting bed offers, and will need insurance authorization once a facility has been chosen.  Expected Discharge Plan: [place] Barriers to Discharge: Continued Medical Work up, Ship broker, SNF Pending bed offer  Expected Discharge Plan and Services Expected Discharge Plan: [place] In-house Referral: Clinical Social Work   Post Acute Care Choice: [place] Living arrangements for the past 2 months: Single Family Home                 DME Arranged: N/A DME Agency: NA                   Social Determinants of Health (SDOH) Interventions    Readmission Risk Interventions    02/09/2022    3:00 PM  Readmission Risk Prevention Plan  Transportation Screening Complete  PCP or Specialist Appt within 5-7 Days Complete  Home Care Screening Complete  Medication Review (RN CM) Complete

## 2022-03-29 DIAGNOSIS — E860 Dehydration: Secondary | ICD-10-CM | POA: Diagnosis not present

## 2022-03-29 DIAGNOSIS — I10 Essential (primary) hypertension: Secondary | ICD-10-CM | POA: Diagnosis not present

## 2022-03-29 DIAGNOSIS — D509 Iron deficiency anemia, unspecified: Secondary | ICD-10-CM | POA: Diagnosis not present

## 2022-03-29 DIAGNOSIS — S32591A Other specified fracture of right pubis, initial encounter for closed fracture: Secondary | ICD-10-CM | POA: Diagnosis not present

## 2022-03-29 NOTE — Progress Notes (Signed)
PROGRESS NOTE    Brandy [name]  BSW:[id number] DOB: 1932-12-09 DOA: 03/26/2022 PCP: Kathyrn Lass, MD    Brief Narrative:  Brandy [name] is a 88 y.o. female with medical history significant for essential hypertension, hyperlipidemia, chronic iron deficiency anemia with baseline hemoglobin range 8-10, who is admitted to [facility] on 03/26/2022 with acute right inferior/superior pubic rami fractures as well as acute right sacral alla fracture after presenting from HOME to [facility] ED complaining of right hip and acute right low back pain following ground-level mechanical fall.  Per orthopedics non-operative-- WBAT with walker.  Assessment and Plan:   Acute right inferior/superior pubic rami fracture and acute fracture of right sacral ala from a ground level fall -on-call [facility] (Dr. Pasty Arch, can WBAT with walker -pain control -PT/OT - will need SNF placement  Depression:  -resume home meds    GERD:  -resume PPO    Essential Hypertension:  -resume home meds    Hyperlipidemia:  -outpatient follow up    Chronic iron deficiency anemia:  -outpatient follow up          DVT prophylaxis: SCDs Start: 03/26/22 2310    Code Status: DNR   Disposition Plan:  Level of care: Telemetry Status is: Observation The patient will require care spanning > 2 midnights and should be moved to inpatient because: pain control, PT/OT  Needs SNF    Consultants:  ortho   Subjective: Awaiting placement  Objective: Vitals:   03/28/22 2100 03/29/22 0500 03/29/22 0613 03/29/22 0845  BP: (!) 153/65  (!) 149/67   Pulse: 76  (!) 46   Resp: 14  14   Temp: 98.6 F (37 C)  97.9 F (36.6 C)   TempSrc: Oral  Oral   SpO2: 98%  100% 98%  Weight:  65.2 kg    Height:        Intake/Output Summary (Last 24 hours) at 03/29/2022 1047 Last data filed at 03/29/2022 0200 Gross per 24 hour  Intake 240 ml  Output 500 ml  Net -260 ml   Filed Weights   03/27/22 0113  03/28/22 0500 03/29/22 0500  Weight: 58.4 kg 60.7 kg 65.2 kg    Examination:   General: Appearance:     Overweight female in no acute distress  Eyes:    PERRL, conjunctiva/corneas clear, EOM's intact       Lungs:     Clear to auscultation bilaterally, respirations unlabored  Heart:    Bradycardic.   MS:   All extremities are intact.   Neurologic:   Awake, alert, oriented x 3. No apparent focal neurological           defect.          Data Reviewed: I have personally reviewed following labs and imaging studies  CBC: Recent Labs  Lab 03/26/22 2147 03/27/22 0349 03/28/22 0323  WBC 7.6 6.8 7.7  NEUTROABS  --  4.3  --   HGB 8.4* 8.1* 8.4*  HCT 26.7* 26.1* 27.2*  MCV 95.0 96.0 96.8  PLT 144* 149* 466   Basic Metabolic Panel: Recent Labs  Lab 03/26/22 2147 03/27/22 0349 03/28/22 0323  NA 141 140 143  K 4.2 4.0 4.3  CL 115* 115* 114*  CO2 21* 21* 24  GLUCOSE 111* 114* 120*  BUN 28* 26* 26*  CREATININE 1.08* 0.98 1.06*  CALCIUM 9.1 8.8* 8.8*  MG 2.0 2.0  --    GFR: Estimated Creatinine Clearance: 30.7 mL/min (A) (by C-G formula based on  SCr of 1.06 mg/dL (H)). Liver Function Tests: Recent Labs  Lab 03/27/22 0349  AST 21  ALT 22  ALKPHOS 67  BILITOT 0.6  PROT 6.1*  ALBUMIN 3.0*   No results for input(s): "LIPASE", "AMYLASE" in the last 168 hours. No results for input(s): "AMMONIA" in the last 168 hours. Coagulation Profile: Recent Labs  Lab 03/27/22 0349  INR 1.1   Cardiac Enzymes: No results for input(s): "CKTOTAL", "CKMB", "CKMBINDEX", "TROPONINI" in the last 168 hours. BNP (last 3 results) No results for input(s): "PROBNP" in the last 8760 hours. HbA1C: No results for input(s): "HGBA1C" in the last 72 hours. CBG: No results for input(s): "GLUCAP" in the last 168 hours. Lipid Profile: No results for input(s): "CHOL", "HDL", "LDLCALC", "TRIG", "CHOLHDL", "LDLDIRECT" in the last 72 hours. Thyroid Function Tests: No results for input(s): "TSH",  "T4TOTAL", "FREET4", "T3FREE", "THYROIDAB" in the last 72 hours. Anemia Panel: No results for input(s): "VITAMINB12", "FOLATE", "FERRITIN", "TIBC", "IRON", "RETICCTPCT" in the last 72 hours. Sepsis Labs: No results for input(s): "PROCALCITON", "LATICACIDVEN" in the last 168 hours.  Recent Results (from the past 240 hour(s))  Surgical pcr screen     Status: None   Collection Time: 03/27/22 10:58 AM   Specimen: Nasal Mucosa; Nasal Swab  Result Value Ref Range Status   MRSA, PCR NEGATIVE NEGATIVE Final   Staphylococcus aureus NEGATIVE NEGATIVE Final    Comment: (NOTE) The Xpert SA Assay (FDA approved for NASAL specimens in patients 74 years of age and older), is one component of a comprehensive surveillance program. It is not intended to diagnose infection nor to guide or monitor treatment. Performed at [facility] [place] [address] [place],  [address]          Radiology Studies: No results found.         LOS: 0 days    Time spent: 35 minutes spent on chart review, discussion with nursing staff, consultants, updating family and interview/physical exam; more than 50% of that time was spent in counseling and/or coordination of care.    Geradine Girt, DO [facility] Available via Epic secure chat 7am-7pm After these hours, please refer to coverage provider listed on [email or web address] 03/29/2022, 10:47 AM

## 2022-03-29 NOTE — Plan of Care (Signed)

## 2022-03-29 NOTE — TOC Progression Note (Addendum)
Transition of Care [facility]) - Progression Note    Patient Details  Name: SIENNAH BARRASSO MRN: [id number] Date of Birth: July 18, 1932  Transition of Care [facility]) CM/SW Contact  Ross Ludwig, [place] Phone Number: 03/29/2022, 4:03 PM  Clinical Narrative:     CSW spoke to patient she is agreeable to SNF, CSW awaiting for bed offers.  CSW will need to get insurance auth once facility is chosen.    CSW Contacted patient's daughter to discuss SNF options, and process on how insurance will pay for stay.  Per patient's daughter, they asked about what can be done if they feel patient needs to go to an ALF or nursing facility for LTC, CSW explained they would have to private pay, and eventually apply for Medicaid.  CSW also informed daughter that whichever facility patient goes to, they Education officer, museum at SNF can assist with determining disposition after rehab.   Expected Discharge Plan: [place] Barriers to Discharge: Continued Medical Work up, Ship broker, SNF Pending bed offer  Expected Discharge Plan and Services Expected Discharge Plan: [place] In-house Referral: Clinical Social Work   Post Acute Care Choice: [place] Living arrangements for the past 2 months: Single Family Home                 DME Arranged: N/A DME Agency: NA                   Social Determinants of Health (SDOH) Interventions    Readmission Risk Interventions    02/09/2022    3:00 PM  Readmission Risk Prevention Plan  Transportation Screening Complete  PCP or Specialist Appt within 5-7 Days Complete  Home Care Screening Complete  Medication Review (RN CM) Complete

## 2022-03-30 DIAGNOSIS — I2510 Atherosclerotic heart disease of native coronary artery without angina pectoris: Secondary | ICD-10-CM | POA: Diagnosis not present

## 2022-03-30 DIAGNOSIS — S32511D Fracture of superior rim of right pubis, subsequent encounter for fracture with routine healing: Secondary | ICD-10-CM | POA: Diagnosis not present

## 2022-03-30 DIAGNOSIS — F32A Depression, unspecified: Secondary | ICD-10-CM | POA: Diagnosis not present

## 2022-03-30 DIAGNOSIS — Z79899 Other long term (current) drug therapy: Secondary | ICD-10-CM | POA: Diagnosis not present

## 2022-03-30 DIAGNOSIS — G4700 Insomnia, unspecified: Secondary | ICD-10-CM | POA: Diagnosis not present

## 2022-03-30 DIAGNOSIS — R739 Hyperglycemia, unspecified: Secondary | ICD-10-CM | POA: Diagnosis not present

## 2022-03-30 DIAGNOSIS — Z96641 Presence of right artificial hip joint: Secondary | ICD-10-CM | POA: Diagnosis not present

## 2022-03-30 DIAGNOSIS — Z853 Personal history of malignant neoplasm of breast: Secondary | ICD-10-CM | POA: Diagnosis not present

## 2022-03-30 DIAGNOSIS — F5105 Insomnia due to other mental disorder: Secondary | ICD-10-CM | POA: Diagnosis not present

## 2022-03-30 DIAGNOSIS — K219 Gastro-esophageal reflux disease without esophagitis: Secondary | ICD-10-CM | POA: Diagnosis not present

## 2022-03-30 DIAGNOSIS — I10 Essential (primary) hypertension: Secondary | ICD-10-CM | POA: Diagnosis not present

## 2022-03-30 DIAGNOSIS — M810 Age-related osteoporosis without current pathological fracture: Secondary | ICD-10-CM | POA: Diagnosis not present

## 2022-03-30 DIAGNOSIS — D509 Iron deficiency anemia, unspecified: Secondary | ICD-10-CM | POA: Diagnosis not present

## 2022-03-30 DIAGNOSIS — Z7401 Bed confinement status: Secondary | ICD-10-CM | POA: Diagnosis not present

## 2022-03-30 DIAGNOSIS — Z96612 Presence of left artificial shoulder joint: Secondary | ICD-10-CM | POA: Diagnosis not present

## 2022-03-30 DIAGNOSIS — S3289XA Fracture of other parts of pelvis, initial encounter for closed fracture: Secondary | ICD-10-CM | POA: Diagnosis not present

## 2022-03-30 DIAGNOSIS — W19XXXD Unspecified fall, subsequent encounter: Secondary | ICD-10-CM | POA: Diagnosis not present

## 2022-03-30 DIAGNOSIS — S3210XD Unspecified fracture of sacrum, subsequent encounter for fracture with routine healing: Secondary | ICD-10-CM | POA: Diagnosis not present

## 2022-03-30 DIAGNOSIS — J929 Pleural plaque without asbestos: Secondary | ICD-10-CM | POA: Diagnosis not present

## 2022-03-30 DIAGNOSIS — E860 Dehydration: Secondary | ICD-10-CM | POA: Diagnosis not present

## 2022-03-30 DIAGNOSIS — S32599A Other specified fracture of unspecified pubis, initial encounter for closed fracture: Secondary | ICD-10-CM | POA: Diagnosis not present

## 2022-03-30 DIAGNOSIS — I1310 Hypertensive heart and chronic kidney disease without heart failure, with stage 1 through stage 4 chronic kidney disease, or unspecified chronic kidney disease: Secondary | ICD-10-CM | POA: Diagnosis not present

## 2022-03-30 DIAGNOSIS — S3282XD Multiple fractures of pelvis without disruption of pelvic ring, subsequent encounter for fracture with routine healing: Secondary | ICD-10-CM | POA: Diagnosis not present

## 2022-03-30 DIAGNOSIS — M8580 Other specified disorders of bone density and structure, unspecified site: Secondary | ICD-10-CM | POA: Diagnosis not present

## 2022-03-30 DIAGNOSIS — S3219XA Other fracture of sacrum, initial encounter for closed fracture: Secondary | ICD-10-CM | POA: Diagnosis not present

## 2022-03-30 DIAGNOSIS — M8000XD Age-related osteoporosis with current pathological fracture, unspecified site, subsequent encounter for fracture with routine healing: Secondary | ICD-10-CM | POA: Diagnosis not present

## 2022-03-30 DIAGNOSIS — I129 Hypertensive chronic kidney disease with stage 1 through stage 4 chronic kidney disease, or unspecified chronic kidney disease: Secondary | ICD-10-CM | POA: Diagnosis not present

## 2022-03-30 DIAGNOSIS — S32591D Other specified fracture of right pubis, subsequent encounter for fracture with routine healing: Secondary | ICD-10-CM | POA: Diagnosis not present

## 2022-03-30 DIAGNOSIS — F4323 Adjustment disorder with mixed anxiety and depressed mood: Secondary | ICD-10-CM | POA: Diagnosis not present

## 2022-03-30 DIAGNOSIS — D649 Anemia, unspecified: Secondary | ICD-10-CM | POA: Diagnosis not present

## 2022-03-30 DIAGNOSIS — E785 Hyperlipidemia, unspecified: Secondary | ICD-10-CM | POA: Diagnosis not present

## 2022-03-30 DIAGNOSIS — J45909 Unspecified asthma, uncomplicated: Secondary | ICD-10-CM | POA: Diagnosis not present

## 2022-03-30 DIAGNOSIS — S32591A Other specified fracture of right pubis, initial encounter for closed fracture: Secondary | ICD-10-CM | POA: Diagnosis not present

## 2022-03-30 DIAGNOSIS — S32599S Other specified fracture of unspecified pubis, sequela: Secondary | ICD-10-CM

## 2022-03-30 MED ORDER — TRAMADOL HCL 50 MG PO TABS: 50.0000 mg | ORAL_TABLET | Freq: Four times a day (QID) | ORAL | 0 refills | Status: AC | PRN

## 2022-03-30 NOTE — Progress Notes (Signed)
Physical Therapy Treatment Patient Details Name: Brandy [name] MRN: [id number] DOB: 01/05/1933 Today's Date: 03/30/2022   History of Present Illness Patient is an 88yo female that presents with HTN, HLD, anemia, hx of breast cancer s/p mastectomy & radiation, GERD, R total shoulder arthroplasty, L shoulder replacement, pubic ramus fracture    PT Comments    General Comments: AxO x 3 very smart Lady who lives home alone.  Pt able to remember past events.  Sharred that she lost both her husband and daughter last year. Assisted OOB required increased time.  General bed mobility comments: Pt required mod assist +2 for lift assist of trunk and bringing BLE off bed, use of chuck pad to assist pt to EOB. General transfer comment: required Mod?Max Asist to rise from elevated bed and complete 1/4 turn to [facility].  Unsteady with decreased weight shift to her RIGHT.  Poor Kyphotic posture.  Then asissted off BSC + 2 assist to attempt amb with walker. General Gait Details: very limited amb distance of 6 feet due to increased pain and fatigue.  Required + 2 assist for safety. Pt will need ST Rehab at SNF to address mobility and functional decline prior to safely returning home.   Recommendations for follow up therapy are one component of a multi-disciplinary discharge planning process, led by the attending physician.  Recommendations may be updated based on patient status, additional functional criteria and insurance authorization.  Follow Up Recommendations  Skilled nursing-short term rehab (<3 hours/day) Can patient physically be transported by private vehicle: No   Assistance Recommended at Discharge Frequent or constant Supervision/Assistance  Patient can return home with the following Help with stairs or ramp for entrance;Assist for transportation;Assistance with cooking/housework;A lot of help with bathing/dressing/bathroom;A lot of help with walking and/or transfers   Equipment Recommendations  None  recommended by PT    Recommendations for Other Services       Precautions / Restrictions Precautions Precautions: Fall Precaution Comments: recent fall, sacral fx, pubic rami fx Restrictions Weight Bearing Restrictions: No RLE Weight Bearing: Weight bearing as tolerated     Mobility  Bed Mobility Overal bed mobility: Needs Assistance Bed Mobility: Supine to Sit     Supine to sit: Mod assist, HOB elevated     General bed mobility comments: Pt required mod assist +2 for lift assist of trunk and bringing BLE off bed, use of chuck pad to assist pt to EOB.    Transfers Overall transfer level: Needs assistance Equipment used: Rolling walker (2 wheels) Transfers: Sit to/from Stand Sit to Stand: Mod assist, Max assist           General transfer comment: required Mod?Max Asist to rise from elevated bed and complete 1/4 turn to [facility].  Unsteady with decreased weight shift to her RIGHT.  Poor Kyphotic posture.  Then asissted off BSC + 2 assist to attempt amb with walker.    Ambulation/Gait Ambulation/Gait assistance: Mod assist, Max assist Gait Distance (Feet): 6 Feet Assistive device: Rolling walker (2 wheels) Gait Pattern/deviations: Step-to pattern, Decreased stance time - right, Trunk flexed, Narrow base of support Gait velocity: decreased     General Gait Details: very limited amb distance of 6 feet due to increased pain and fatigue.  Required + 2 assist for safety.   Stairs             Wheelchair Mobility    Modified Rankin (Stroke Patients Only)       Balance  Cognition Arousal/Alertness: Awake/alert Behavior During Therapy: WFL for tasks assessed/performed Overall Cognitive Status: Within Functional Limits for tasks assessed                                 General Comments: AxO x 3 very smart Lady who lives home alone.  Pt able to remember past events.  Sharred that she lost  both her husband and daughter last year.        Exercises      General Comments        Pertinent Vitals/Pain Pain Assessment Pain Assessment: Faces Faces Pain Scale: Hurts little more Pain Location: Right hip posterior aspect Pain Descriptors / Indicators: Guarding, Discomfort, Tender Pain Intervention(s): Monitored during session, Premedicated before session, Repositioned    Home Living                          Prior Function            PT Goals (current goals can now be found in the care plan section) Progress towards PT goals: Progressing toward goals    Frequency    Min 2X/week      PT Plan Current plan remains appropriate    Co-evaluation              AM-PAC PT "6 Clicks" Mobility   Outcome Measure  Help needed turning from your back to your side while in a flat bed without using bedrails?: A Lot Help needed moving from lying on your back to sitting on the side of a flat bed without using bedrails?: A Lot Help needed moving to and from a bed to a chair (including a wheelchair)?: A Lot Help needed standing up from a chair using your arms (e.g., wheelchair or bedside chair)?: A Lot Help needed to walk in hospital room?: A Lot Help needed climbing 3-5 steps with a railing? : A Lot 6 Click Score: 12    End of Session Equipment Utilized During Treatment: Gait belt Activity Tolerance: Patient limited by pain Patient left: in chair;with call bell/phone within reach;with chair alarm set Nurse Communication: Mobility status PT Visit Diagnosis: Pain;Difficulty in walking, not elsewhere classified (R26.2);History of falling (Z91.81) Pain - Right/Left: Right Pain - part of body: Hip     Time: 9798-9211 PT Time Calculation (min) (ACUTE ONLY): 25 min  Charges:  $Gait Training: 8-22 mins $Therapeutic Activity: 8-22 mins                    Rica Koyanagi  PTA Acute  Rehabilitation Services Office M-F          [phone] Weekend pager  [phone]

## 2022-03-30 NOTE — Discharge Summary (Signed)
Physician Discharge Summary  Brandy [name] IPJ:[id number] DOB: 1933/03/13 DOA: 03/26/2022  PCP: Kathyrn Lass, MD  Admit date: 03/26/2022 Discharge date: 03/30/2022  Admitted From: home Discharge disposition: SNF   Recommendations for Outpatient Follow-Up:   Non-operative fractures continue WBAT with walker and assistance Cbc, bmp 1 week   Discharge Diagnosis:   Principal Problem:   Pubic ramus fracture ([place]) Active Problems:   Depression   Gastroesophageal reflux disease without esophagitis   Essential hypertension   Chronic iron deficiency anemia   Fall at home, initial encounter   Dehydration   Sacral fracture, closed ([place])    Discharge Condition: Improved.  Diet recommendation:  Regular.  Wound care: None.  Code status:DNR   History of Present Illness:   Brandy [name] is a 88 y.o. female with medical history significant for essential hypertension, hyperlipidemia, chronic iron deficiency anemia with baseline hemoglobin range 8-10, who is admitted to [facility] on 03/26/2022 with acute right inferior/superior pubic rami fractures as well as acute right sacral alla fracture after presenting from home to [facility] ED complaining of right hip and acute right low back pain following ground-level mechanical fall.    Hospital Course by Problem:   Acute right inferior/superior pubic rami fracture and acute fracture of right sacral ala from a ground level fall -on-call [facility] (Dr. Pasty Arch, can WBAT with walker -pain control -PT/OT - will need SNF placement   Depression:  -resume home meds    GERD:  -resume PPO    Essential Hypertension:  -resume home meds    Hyperlipidemia:  -outpatient follow up    Chronic iron deficiency anemia:  -outpatient follow up      Medical Consultants:   ortho   Discharge Exam:   Vitals:   03/29/22 2037 03/30/22 0500  BP: (!) 149/63 (!) 142/62  Pulse: 89 78  Resp: 18 14  Temp: 99  F (37.2 C) 98.2 F (36.8 C)  SpO2: 95% 98%   Vitals:   03/29/22 0845 03/29/22 1325 03/29/22 2037 03/30/22 0500  BP:  (!) 147/62 (!) 149/63 (!) 142/62  Pulse:  78 89 78  Resp:  '16 18 14  '$ Temp:  98 F (36.7 C) 99 F (37.2 C) 98.2 F (36.8 C)  TempSrc:  Oral Oral Oral  SpO2: 98% 99% 95% 98%  Weight:    66.8 kg  Height:        General exam: Appears calm and comfortable.   The results of significant diagnostics from this hospitalization (including imaging, microbiology, ancillary and laboratory) are listed below for reference.     Procedures and Diagnostic Studies:   CT L-SPINE NO CHARGE  Result Date: 03/26/2022 CLINICAL DATA:  Initial evaluation for acute trauma, fall. EXAM: CT LUMBAR SPINE WITHOUT CONTRAST TECHNIQUE: Multidetector CT imaging of the lumbar spine was performed without intravenous contrast administration. Multiplanar CT image reconstructions were also generated. RADIATION DOSE REDUCTION: This exam was performed according to the departmental dose-optimization program which includes automated exposure control, adjustment of the mA and/or kV according to patient size and/or use of iterative reconstruction technique. COMPARISON:  Prior CTs from earlier the same day. FINDINGS: Segmentation: Standard. Lowest well-formed disc space labeled the L5-S1 level. Alignment: Trace retrolisthesis of T12 on L1 and L1 on L2, likely chronic and degenerative. Alignment otherwise normal preservation of the normal lumbar lordosis. Vertebrae: Acute nondisplaced fracture of the right sacral ala. No visible extension through the sacral foramina. Acute fractures of the right superior and inferior  pubic rami noted. No acute vertebral body fracture. Chronic height loss noted at T10. Underlying osteopenia. No worrisome osseous lesions. Paraspinal and other soft tissues: Paraspinous soft tissues better evaluated on corresponding CT of the abdomen and pelvis. No visible abnormality. Aortic atherosclerosis.  Right total hip arthroplasty partially visualized. Disc levels: Underlying mild for age multilevel degenerative spondylosis, most pronounced at L4-5 and L5-S1. No high-grade spinal stenosis by CT. IMPRESSION: 1. Acute nondisplaced fracture of the right sacral ala. 2. Acute fractures of the right superior and inferior pubic rami. 3. No other acute traumatic injury within the lumbar spine. Aortic Atherosclerosis (ICD10-I70.0). Electronically Signed   By: Jeannine Boga M.D.   On: 03/26/2022 20:14   CT ABDOMEN PELVIS WO CONTRAST  Result Date: 03/26/2022 CLINICAL DATA:  Pelvis fracture.  Mechanical fall yesterday. EXAM: CT ABDOMEN AND PELVIS WITHOUT CONTRAST TECHNIQUE: Multidetector CT imaging of the abdomen and pelvis was performed following the standard protocol without IV contrast. RADIATION DOSE REDUCTION: This exam was performed according to the departmental dose-optimization program which includes automated exposure control, adjustment of the mA and/or kV according to patient size and/or use of iterative reconstruction technique. COMPARISON:  Abdominopelvic CT a 03/16/2022 FINDINGS: Lower chest: Similar pleural thickening to recent prior. No acute basilar airspace disease. The heart is normal in size. Hepatobiliary: Interval removal of biliary stent. Increased pneumobilia likely related to recent ERCP. Grossly stable biliary prominence on this unenhanced exam. Air in the gallbladder again seen. No pericholecystic inflammation. Homogeneous hepatic attenuation without acute findings. Pancreas: Parenchymal atrophy. No ductal dilatation or inflammation. Spleen: Homogeneous attenuation, normal in size. No evidence of injury on this unenhanced exam. Adrenals/Urinary Tract: Normal adrenal glands. Bilateral renal parenchymal thinning. Bilateral renal cysts including left kidney peripelvic cysts. No specific imaging follow-up is recommended. No hydronephrosis. Unremarkable urinary bladder. Stomach/Bowel: Small  hiatal hernia. No bowel obstruction or inflammation. Small bowel enteric sutures in the central abdomen with an adjacent patulous loop of small bowel. Moderate volume of colonic stool. Appendix is not definitively seen. Vascular/Lymphatic: Aortic atherosclerosis without aneurysm. No adenopathy. Reproductive: Normal for age uterine atrophy.  No adnexal mass. Other: No ascites or free fluid. No free air. Postsurgical change of the anterior abdominal wall. Musculoskeletal: Lumbar spine assessed on concurrent lumbar spine reformats, reported separately. Mildly comminuted and displaced fracture of the right superior pubic ramus at the puboacetabular junction. Fracture does not extend to the right hip arthroplasty. Displaced and comminuted fracture of the right inferior pubic ramus. There is a nondisplaced right sacral fracture without definite involvement of the sacral foramen. The bones are diffusely under mineralized. Right hip arthroplasty without periprosthetic fracture. IMPRESSION: 1. Right superior and inferior pubic rami fractures, mildly comminuted and displaced. 2. Nondisplaced right sacral fracture without definite involvement of the sacral foramen. 3. Interval removal of biliary stent. Increased pneumobilia likely related to recent ERCP. Grossly stable biliary prominence on this unenhanced exam. 4. Additional stable chronic findings as described. Aortic Atherosclerosis (ICD10-I70.0). Electronically Signed   By: Keith Rake M.D.   On: 03/26/2022 19:57   DG Hip Unilat  With Pelvis 2-3 Views Right  Result Date: 03/26/2022 CLINICAL DATA:  Hip fracture. EXAM: DG HIP (WITH OR WITHOUT PELVIS) 2-3V RIGHT COMPARISON:  None Available. FINDINGS: Right total hip arthroplasty. Mildly displaced fractures of the right inferior and superior pubic rami. No hip dislocation. Calcific atherosclerosis. Lower lumbar degenerative change. Scattered enthesopathy. IMPRESSION: Mildly displaced fractures of the right inferior  and superior pubic rami. Electronically Signed   By: Roslynn Amble  Ronnald Ramp M.D.   On: 03/26/2022 16:42     Labs:   Basic Metabolic Panel: Recent Labs  Lab 03/26/22 2147 03/27/22 0349 03/28/22 0323  NA 141 140 143  K 4.2 4.0 4.3  CL 115* 115* 114*  CO2 21* 21* 24  GLUCOSE 111* 114* 120*  BUN 28* 26* 26*  CREATININE 1.08* 0.98 1.06*  CALCIUM 9.1 8.8* 8.8*  MG 2.0 2.0  --    GFR Estimated Creatinine Clearance: 31 mL/min (A) (by C-G formula based on SCr of 1.06 mg/dL (H)). Liver Function Tests: Recent Labs  Lab 03/27/22 0349  AST 21  ALT 22  ALKPHOS 67  BILITOT 0.6  PROT 6.1*  ALBUMIN 3.0*   No results for input(s): "LIPASE", "AMYLASE" in the last 168 hours. No results for input(s): "AMMONIA" in the last 168 hours. Coagulation profile Recent Labs  Lab 03/27/22 0349  INR 1.1    CBC: Recent Labs  Lab 03/26/22 2147 03/27/22 0349 03/28/22 0323  WBC 7.6 6.8 7.7  NEUTROABS  --  4.3  --   HGB 8.4* 8.1* 8.4*  HCT 26.7* 26.1* 27.2*  MCV 95.0 96.0 96.8  PLT 144* 149* 157   Cardiac Enzymes: No results for input(s): "CKTOTAL", "CKMB", "CKMBINDEX", "TROPONINI" in the last 168 hours. BNP: Invalid input(s): "POCBNP" CBG: No results for input(s): "GLUCAP" in the last 168 hours. D-Dimer No results for input(s): "DDIMER" in the last 72 hours. Hgb A1c No results for input(s): "HGBA1C" in the last 72 hours. Lipid Profile No results for input(s): "CHOL", "HDL", "LDLCALC", "TRIG", "CHOLHDL", "LDLDIRECT" in the last 72 hours. Thyroid function studies No results for input(s): "TSH", "T4TOTAL", "T3FREE", "THYROIDAB" in the last 72 hours.  Invalid input(s): "FREET3" Anemia work up No results for input(s): "VITAMINB12", "FOLATE", "FERRITIN", "TIBC", "IRON", "RETICCTPCT" in the last 72 hours. Microbiology Recent Results (from the past 240 hour(s))  Surgical pcr screen     Status: None   Collection Time: 03/27/22 10:58 AM   Specimen: Nasal Mucosa; Nasal Swab  Result Value  Ref Range Status   MRSA, PCR NEGATIVE NEGATIVE Final   Staphylococcus aureus NEGATIVE NEGATIVE Final    Comment: (NOTE) The Xpert SA Assay (FDA approved for NASAL specimens in patients 33 years of age and older), is one component of a comprehensive surveillance program. It is not intended to diagnose infection nor to guide or monitor treatment. Performed at [facility], [place] [address], [place], [place] [address]      Discharge Instructions:   Discharge Instructions     Diet - low sodium heart healthy   Complete by: As directed    Increase activity slowly   Complete by: As directed       Allergies as of 03/30/2022   No Known Allergies      Medication List     STOP taking these medications    metoprolol tartrate 100 MG tablet Commonly known as: LOPRESSOR       TAKE these medications    acetaminophen 500 MG tablet Commonly known as: TYLENOL Take 1,000 mg by mouth every 6 (six) hours as needed for headache (pain).   alendronate 70 MG tablet Commonly known as: FOSAMAX Take 70 mg by mouth every Sunday.   atorvastatin 40 MG tablet Commonly known as: LIPITOR Take 40 mg by mouth at bedtime.   DULoxetine 20 MG capsule Commonly known as: CYMBALTA Take 40 mg by mouth in the morning.   ferrous sulfate 325 (65 FE) MG tablet Take 1 tablet (  325 mg total) by mouth daily.   gabapentin 300 MG capsule Commonly known as: NEURONTIN Take 2 capsules (600 mg total) by mouth at bedtime.   multivitamin with minerals Tabs tablet Take 1 tablet by mouth in the morning.   omeprazole 40 MG capsule Commonly known as: PRILOSEC Take 1 capsule (40 mg total) by mouth daily before breakfast.   rOPINIRole 0.25 MG tablet Commonly known as: REQUIP Take 0.25 mg by mouth at bedtime.   traMADol 50 MG tablet Commonly known as: ULTRAM Take 1 tablet (50 mg total) by mouth 4 (four) times daily as needed for moderate pain (pain.). What changed:  how much to  take when to take this reasons to take this   valsartan-hydrochlorothiazide 80-12.5 MG tablet Commonly known as: DIOVAN-HCT Take 1 tablet by mouth in the morning.   VITAMIN B-12 PO Take 1 tablet by mouth every evening.   Vitamin D (Ergocalciferol) 1.25 MG (50000 UNIT) Caps capsule Commonly known as: DRISDOL Take 50,000 Units by mouth every Sunday.        Follow-up Information     Kathyrn Lass, MD Follow up in 1 week(s).   Specialty: Family Medicine Contact information: [place] [id number] [phone]                  Time coordinating discharge: 45 min  Signed:  Geradine Girt DO  [facility] 03/30/2022, 10:42 AM

## 2022-03-30 NOTE — TOC Transition Note (Signed)
Transition of Care [facility]) - CM/SW Discharge Note   Patient Details  Name: Brandy [name] MRN: [id number] Date of Birth: 02/13/1933  [facility] CM/SW Contact:  Lennart Pall, LCSW Phone Number: 03/30/2022, 12:02 PM   Clinical Narrative:    Have reviewed SNF bed offers with pt/daughter and they have accepted bed at [facility] in [place].  Have received insurance authorization and medical clearance for dc today and pt/daughter agreeable.  PTAR called at 12:00pm.  RN to call report to [phone].  No further TOC needs.   Final next level of care: Skilled Nursing Facility Barriers to Discharge: Barriers Resolved   Patient Goals and CMS Choice Patient states their goals for this hospitalization and ongoing recovery are:: return home following rehab      Discharge Placement   Existing PASRR number confirmed : 03/29/22          Patient chooses bed at: Other - please specify in the comment section below: ([place]) Patient to be transferred to facility by: [place] Name of family member notified: daughter, [name] Patient and family notified of of transfer: 03/30/22  Discharge Plan and Services In-house Referral: Clinical Social Work   Post Acute Care Choice: [place]          DME Arranged: N/A DME Agency: NA                  Social Determinants of Health (SDOH) Interventions     Readmission Risk Interventions    02/09/2022    3:00 PM  Readmission Risk Prevention Plan  Transportation Screening Complete  PCP or Specialist Appt within 5-7 Days Complete  Home Care Screening Complete  Medication Review (RN CM) Complete

## 2022-03-31 DIAGNOSIS — K219 Gastro-esophageal reflux disease without esophagitis: Secondary | ICD-10-CM | POA: Diagnosis not present

## 2022-03-31 DIAGNOSIS — I1310 Hypertensive heart and chronic kidney disease without heart failure, with stage 1 through stage 4 chronic kidney disease, or unspecified chronic kidney disease: Secondary | ICD-10-CM | POA: Diagnosis not present

## 2022-03-31 DIAGNOSIS — Z79899 Other long term (current) drug therapy: Secondary | ICD-10-CM | POA: Diagnosis not present

## 2022-03-31 DIAGNOSIS — D509 Iron deficiency anemia, unspecified: Secondary | ICD-10-CM | POA: Diagnosis not present

## 2022-03-31 DIAGNOSIS — S32591D Other specified fracture of right pubis, subsequent encounter for fracture with routine healing: Secondary | ICD-10-CM | POA: Diagnosis not present

## 2022-04-01 DIAGNOSIS — J929 Pleural plaque without asbestos: Secondary | ICD-10-CM | POA: Diagnosis not present

## 2022-04-01 DIAGNOSIS — M8580 Other specified disorders of bone density and structure, unspecified site: Secondary | ICD-10-CM | POA: Diagnosis not present

## 2022-04-01 DIAGNOSIS — S3210XD Unspecified fracture of sacrum, subsequent encounter for fracture with routine healing: Secondary | ICD-10-CM | POA: Diagnosis not present

## 2022-04-01 DIAGNOSIS — S3282XD Multiple fractures of pelvis without disruption of pelvic ring, subsequent encounter for fracture with routine healing: Secondary | ICD-10-CM | POA: Diagnosis not present

## 2022-04-01 DIAGNOSIS — M8000XD Age-related osteoporosis with current pathological fracture, unspecified site, subsequent encounter for fracture with routine healing: Secondary | ICD-10-CM | POA: Diagnosis not present

## 2022-04-01 DIAGNOSIS — I2510 Atherosclerotic heart disease of native coronary artery without angina pectoris: Secondary | ICD-10-CM | POA: Diagnosis not present

## 2022-04-01 DIAGNOSIS — D649 Anemia, unspecified: Secondary | ICD-10-CM | POA: Diagnosis not present

## 2022-04-01 DIAGNOSIS — I129 Hypertensive chronic kidney disease with stage 1 through stage 4 chronic kidney disease, or unspecified chronic kidney disease: Secondary | ICD-10-CM | POA: Diagnosis not present

## 2022-04-01 DIAGNOSIS — R739 Hyperglycemia, unspecified: Secondary | ICD-10-CM | POA: Diagnosis not present

## 2022-04-02 DIAGNOSIS — I1310 Hypertensive heart and chronic kidney disease without heart failure, with stage 1 through stage 4 chronic kidney disease, or unspecified chronic kidney disease: Secondary | ICD-10-CM | POA: Diagnosis not present

## 2022-04-02 DIAGNOSIS — S32591D Other specified fracture of right pubis, subsequent encounter for fracture with routine healing: Secondary | ICD-10-CM | POA: Diagnosis not present

## 2022-04-02 DIAGNOSIS — Z79899 Other long term (current) drug therapy: Secondary | ICD-10-CM | POA: Diagnosis not present

## 2022-04-03 DIAGNOSIS — Z79899 Other long term (current) drug therapy: Secondary | ICD-10-CM | POA: Diagnosis not present

## 2022-04-03 DIAGNOSIS — S32591D Other specified fracture of right pubis, subsequent encounter for fracture with routine healing: Secondary | ICD-10-CM | POA: Diagnosis not present

## 2022-04-07 ENCOUNTER — Telehealth: Payer: Self-pay

## 2022-04-07 DIAGNOSIS — S32591D Other specified fracture of right pubis, subsequent encounter for fracture with routine healing: Secondary | ICD-10-CM | POA: Diagnosis not present

## 2022-04-07 DIAGNOSIS — Z79899 Other long term (current) drug therapy: Secondary | ICD-10-CM | POA: Diagnosis not present

## 2022-04-07 DIAGNOSIS — D509 Iron deficiency anemia, unspecified: Secondary | ICD-10-CM | POA: Diagnosis not present

## 2022-04-07 NOTE — Telephone Encounter (Signed)
-----   Message from Yevette Edwards, RN sent at 03/24/2022  9:56 AM EDT ----- Regarding: Labs Hepatic function panel due - order is in epic

## 2022-04-07 NOTE — Telephone Encounter (Signed)
Pt daughter Erline Levine was made aware that Dr. Lyndel Safe is requesting labs to be drawn: Location to lab provided: Erline Levine verbalized understanding with all questions answered.

## 2022-04-09 ENCOUNTER — Encounter: Payer: Medicare HMO | Admitting: Cardiothoracic Surgery

## 2022-04-15 DIAGNOSIS — S32591A Other specified fracture of right pubis, initial encounter for closed fracture: Secondary | ICD-10-CM | POA: Diagnosis not present

## 2022-04-16 DIAGNOSIS — S3282XD Multiple fractures of pelvis without disruption of pelvic ring, subsequent encounter for fracture with routine healing: Secondary | ICD-10-CM | POA: Diagnosis not present

## 2022-04-16 DIAGNOSIS — S32591D Other specified fracture of right pubis, subsequent encounter for fracture with routine healing: Secondary | ICD-10-CM | POA: Diagnosis not present

## 2022-04-16 DIAGNOSIS — S3210XD Unspecified fracture of sacrum, subsequent encounter for fracture with routine healing: Secondary | ICD-10-CM | POA: Diagnosis not present

## 2022-04-16 DIAGNOSIS — G4700 Insomnia, unspecified: Secondary | ICD-10-CM | POA: Diagnosis not present

## 2022-04-16 DIAGNOSIS — Z79899 Other long term (current) drug therapy: Secondary | ICD-10-CM | POA: Diagnosis not present

## 2022-04-26 NOTE — Progress Notes (Unsigned)
Brandy [name] MRN: [id number] DOB/AGE: 11-Oct-1932 88 y.o.  Primary Care Physician:Miller, Lattie Haw, MD Primary Cardiologist: Gayla Doss, MD   PROBLEM LIST: 1.  Moderate to severe aortic stenosis with aortic valve area of 0.76 cm and mean gradient 31 mmHg with a normal ejection fraction; no conduction abnormalities 2.  Hypertension 3.  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  Frailty and ambulates with walker 5.  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  CKD stage III 7.  Iron deficiency anemia   HISTORY OF PRESENT ILLNESS: The patient returns for follow-up.  I saw her last in the end of August.  At that point in time she was more dyspneic and was more amenable to pursuing potential aortic valve intervention.  She is referred for CT scan.  Unfortunately she was admitted about a month ago with pubic ramus fracture.  Apparently she tripped when she was ambulating with her walker.  She fell backwards hitting her right hip and buttocks but did not hit her head.  She was evaluated by orthopedic surgery and no acute surgical intervention was required.  Unfortunately because she was ambulating awkwardly she fractured her right hip as well and this was realized last week.  Because of this she is on strict bedrest to allow all of her fractures to heal before she rehabilitates.  In regards to cardiovascular symptoms she denies any shortness of breath again because she is not ambulating very much.  She denies any exertional angina.  She has had no severe peripheral edema, paroxysmal nocturnal dyspnea, orthopnea.  She has been relatively stable from a cardiovascular standpoint.  Past Medical History:  Diagnosis Date   Anemia    Arthritis    shoulders   Asthma    mild, no inhalers used   Breast cancer ([place]) 12/18/2011   left breast masectomy=metastatic ca in (1/1) lymph node ,invasive ductal ca,2  foci,,dcis,lymph ovascular invasion identified,surgical resection margins neg for ca,additional tissue=benign skin and subcutaneous tissue   Bronchitis    hx of;last time >49yrago   Depression    takes Celexa daily   Full dentures    Gall stones 2015   GERD (gastroesophageal reflux disease)    takes Prilosec prn   H/O hiatal hernia    History of kidney stones    many yrs ago   Hx of radiation therapy 04/26/12 -06/10/12   left breast   Hyperlipidemia    takes Simvastatin daily   Hypertension    takes Amlodipine and Diovan daily   Insomnia    takes Ambien nightly   Urinary urgency     Past Surgical History:  Procedure Laterality Date   APPENDECTOMY     BILIARY DILATION  02/07/2022   Procedure: BILIARY DILATION;  Surgeon: GJackquline Denmark MD;  Location: [facility]  Service: Gastroenterology;;   BILIARY DILATION  03/19/2022   Procedure: BILIARY DILATION;  Surgeon: MIrving Copas, MD;  [facility];  Service: Gastroenterology;;   BILIARY STENT PLACEMENT  02/07/2022   Procedure: BILIARY STENT PLACEMENT;  Surgeon: GJackquline Denmark MD;  [facility]  Service: Gastroenterology;;   BIOPSY  03/19/2022   Procedure: BIOPSY;  Surgeon: MIrving Copas, MD;  [facility];  Service: Gastroenterology;;   bladder tack     BREAST BIOPSY  1998   left    DILATION AND CURETTAGE OF UTERUS     ENDOSCOPIC  RETROGRADE CHOLANGIOPANCREATOGRAPHY (ERCP) WITH PROPOFOL N/A 02/01/2014   Procedure: ENDOSCOPIC RETROGRADE CHOLANGIOPANCREATOGRAPHY (ERCP) WITH PROPOFOL;  Surgeon: [name], MD;  [facility];  Service: Endoscopy;  Laterality: N/A;   ENDOSCOPIC RETROGRADE CHOLANGIOPANCREATOGRAPHY (ERCP) WITH PROPOFOL N/A 03/19/2022   Procedure: ENDOSCOPIC RETROGRADE CHOLANGIOPANCREATOGRAPHY (ERCP) WITH PROPOFOL;  Surgeon: Rush Landmark Telford Nab., MD;  [facility];  Service: Gastroenterology;  Laterality: N/A;   ERCP N/A 02/07/2022   Procedure:  ENDOSCOPIC RETROGRADE CHOLANGIOPANCREATOGRAPHY (ERCP);  Surgeon: Jackquline Denmark, MD;  Location: [facility];  Service: Gastroenterology;  Laterality: N/A;   ESOPHAGOGASTRODUODENOSCOPY     ESOPHAGOGASTRODUODENOSCOPY N/A 06/26/2016   Procedure: ESOPHAGOGASTRODUODENOSCOPY (EGD);  Surgeon: [name], MD;  Location: [facility];  Service: Endoscopy;  Laterality: N/A;   ESOPHAGOGASTRODUODENOSCOPY (EGD) WITH PROPOFOL  12/19/2013   Procedure: ESOPHAGOGASTRODUODENOSCOPY (EGD) WITH PROPOFOL;  Surgeon: Beryle Beams, MD;  Location: [facility];  Service: Endoscopy;;   ESOPHAGOGASTRODUODENOSCOPY (EGD) WITH PROPOFOL N/A 09/10/2016   Procedure: ESOPHAGOGASTRODUODENOSCOPY (EGD) WITH PROPOFOL;  Surgeon: [name], MD;  [facility];  Service: Endoscopy;  Laterality: N/A;   EUS  06/04/2011   Procedure: UPPER ENDOSCOPIC ULTRASOUND (EUS) LINEAR;  Surgeon: Owens Loffler, MD;  [facility];  Service: Endoscopy;  Laterality: N/A;   EUS N/A 02/01/2014   Procedure: UPPER ENDOSCOPIC ULTRASOUND (EUS) LINEAR;  Surgeon: [name], MD;  [facility];  Service: Endoscopy;  Laterality: N/A;   EXPLORATORY LAPAROTOMY      biopsy of intra-abdominal mass   INTRAMEDULLARY (IM) NAIL INTERTROCHANTERIC Right 10/24/2018   Procedure: INTRAMEDULLARY (IM) NAIL INTERTROCHANTRIC;  Surgeon: Gaynelle Arabian, MD;  [facility];  Service: Orthopedics;  Laterality: Right;   LAPAROTOMY N/A 11/17/2016   Procedure: EXPLORATORY LAPAROTOMY WITH LYSIS OF ADHESIONS, SMALL BOWEL RESECTION, REPAIR OF INCARCERATED VENTRAL INCISIONAL HERNIA, INSERTION OF BIOLOGIC MESH PATCH,APPLICATION OF WOUND VAC DRESSING;  Surgeon: Armandina Gemma, MD;  [facility];  Service: General;  Laterality: N/A;   MASTECTOMY W/ SENTINEL NODE BIOPSY  12/18/2011   Procedure: MASTECTOMY WITH SENTINEL LYMPH NODE BIOPSY;  Surgeon: Rolm Bookbinder, MD;  Location: [place];  Service: General;  Laterality: Left;   PORT-A-CATH REMOVAL Right  06/15/2013   Procedure: REMOVAL PORT-A-CATH;  Surgeon: Rolm Bookbinder, MD;  Location: [place];  Service: General;  Laterality: Right;   PORTACATH PLACEMENT  01/27/2012   Procedure: INSERTION PORT-A-CATH;  Surgeon: Rolm Bookbinder, MD;  Location: [facility];  Service: General;  Laterality: Right;  PORT PLACEMENT   REMOVAL OF STONES  02/07/2022   Procedure: REMOVAL OF STONES;  Surgeon: Jackquline Denmark, MD;  Location: [place];  Service: Gastroenterology;;   REMOVAL OF STONES  03/19/2022   Procedure: REMOVAL OF STONES;  Surgeon: Irving Copas., MD;  [facility];  Service: Gastroenterology;;   RIGHT/LEFT HEART CATH AND CORONARY ANGIOGRAPHY N/A 05/08/2021   Procedure: RIGHT/LEFT HEART CATH AND CORONARY ANGIOGRAPHY;  Surgeon: Jettie Booze, MD;  Location: [facility];  Service: Cardiovascular;  Laterality: N/A;   SPHINCTEROTOMY  02/07/2022   Procedure: SPHINCTEROTOMY;  Surgeon: Jackquline Denmark, MD;  Location: [facility];  Service: Gastroenterology;;   Bess Kinds CHOLANGIOSCOPY N/A 03/19/2022   Procedure: DDUKGURK CHOLANGIOSCOPY;  Surgeon: Irving Copas., MD;  [facility];  Service: Gastroenterology;  Laterality: N/A;   STENT REMOVAL  03/19/2022   Procedure: STENT REMOVAL;  Surgeon: Irving Copas., MD;  [facility];  Service: Gastroenterology;;   TOTAL HIP ARTHROPLASTY Right 11/14/2018   Procedure: REMOVAL OF INTRAMEDULLARY NAIL AND  POSTERIOR TOTAL HIP ARTHROPLASTY;  Surgeon: Gaynelle Arabian, MD;  [facility];  Service: Orthopedics;  Laterality: Right;   TOTAL SHOULDER REPLACEMENT  2011   left   TUBAL LIGATION      Family History  Problem Relation Age of Onset   Prostate cancer Father    Breast cancer Other     Social History   Socioeconomic History   Marital status: Widowed    Spouse name: Not on file   Number of children: Not on file   Years of education: Not on file   Highest  education level: Not on file  Occupational History   Occupation: retired  Tobacco Use   Smoking status: Never   Smokeless tobacco: Never  Vaping Use   Vaping Use: Never used  Substance and Sexual Activity   Alcohol use: No   Drug use: No   Sexual activity: Yes    Birth control/protection: Surgical    Comment: mensus age 56, 75st pregnancy 49, no hrt gg4,p3, 1 babay lived a few hours complications  Other Topics Concern   Not on file  Social History Narrative   Not on file   Social Determinants of Health   Financial Resource Strain: Not on file  Food Insecurity: No Food Insecurity (03/27/2022)   Hunger Vital Sign    Worried About Running Out of Food in the Last Year: Never true    Ran Out of Food in the Last Year: Never true  Transportation Needs: No Transportation Needs (03/27/2022)   PRAPARE - Hydrologist (Medical): No    Lack of Transportation (Non-Medical): No  Physical Activity: Unknown (10/29/2018)   Exercise Vital Sign    Days of Exercise per Week: Patient refused    Minutes of Exercise per Session: Patient refused  Stress: Not on file  Social Connections: Unknown (10/29/2018)   Social Connection and Isolation Panel [NHANES]    Frequency of Communication with Friends and Family: Patient refused    Frequency of Social Gatherings with Friends and Family: Patient refused    Attends Religious Services: Patient refused    Active Member of Clubs or Organizations: Patient refused    Attends Archivist Meetings: Patient refused    Marital Status: Patient refused  Intimate Partner Violence: Not At Risk (03/27/2022)   Humiliation, Afraid, Rape, and Kick questionnaire    Fear of Current or Ex-Partner: No    Emotionally Abused: No    Physically Abused: No    Sexually Abused: No     Prior to Admission medications   Medication Sig Start Date End Date Taking? Authorizing Provider  acetaminophen (TYLENOL) 500 MG tablet Take 1,000 mg by mouth  every 6 (six) hours as needed (pain).    [provider]  alendronate (FOSAMAX) 70 MG tablet Take 70 mg by mouth once a week. sunday 04/01/21   [provider]  amoxicillin-clavulanate (AUGMENTIN) 875-125 MG tablet Take 1 tablet by mouth 2 (two) times daily for 3 days. 05/09/21 05/12/21  Jennye Boroughs, MD  atorvastatin (LIPITOR) 40 MG tablet Take 40 mg by mouth daily.  04/27/16   [provider]  Cyanocobalamin (VITAMIN B-12 PO) Take 1 tablet by mouth daily.    [provider]  DULoxetine (CYMBALTA) 20 MG capsule Take 20 mg by mouth 2 (two) times daily. 04/17/21   [provider]  ferrous sulfate 325 (65 FE) MG tablet Take 1 tablet (325 mg total) by mouth daily. 05/09/21   Jennye Boroughs,  MD  gabapentin (NEURONTIN) 300 MG capsule TAKE 1 CAPSULE BY MOUTH EVERYDAY AT BEDTIME Patient taking differently: Take 300 mg by mouth at bedtime. 11/08/20   Nicholas Lose, MD  Multiple Vitamin (MULTIVITAMIN WITH MINERALS) TABS tablet Take 1 tablet by mouth daily.    [provider]  omeprazole (PRILOSEC) 40 MG capsule Take 1 capsule (40 mg total) by mouth daily as needed (acid reflux). 05/09/21   Jennye Boroughs, MD  traMADol (ULTRAM) 50 MG tablet Take 2 tablets (100 mg total) by mouth 4 (four) times daily. 11/16/18   Edmisten, Kristie L, PA  valsartan-hydrochlorothiazide (DIOVAN-HCT) 80-12.5 MG tablet Take 1 tablet by mouth daily.    [provider]  Vitamin D, Ergocalciferol, (DRISDOL) 50000 units CAPS capsule Take 50,000 Units by mouth every 7 (seven) days. Sunday    [provider]    No Known Allergies  REVIEW OF SYSTEMS:  General: no fevers/chills/night sweats Eyes: no blurry vision, diplopia, or amaurosis ENT: no sore throat or hearing loss Resp: no cough, wheezing, or hemoptysis CV: no edema or palpitations GI: no abdominal pain, nausea, vomiting, diarrhea, or constipation GU: no dysuria, frequency, or hematuria Skin: no rash Neuro:  no headache, numbness, tingling, or weakness of extremities Musculoskeletal: no joint pain or swelling Heme: no bleeding, DVT, or easy bruising Endo: no polydipsia or polyuria  BP 116/60 (BP Location: Left Arm, Patient Position: Sitting, Cuff Size: Normal)   Ht _0  (1.499 m)   Wt 142 lb (64.4 kg)   BMI 28.68 kg/m   PHYSICAL EXAM: GEN:  AO x 3 in no acute distress, frail HEENT: normal Dentition: Has both upper and lower dentures Neck: JVP normal. +1 carotid upstrokes without bruits. No thyromegaly. Lungs: equal expansion, clear bilaterally CV: Apex is discrete and nondisplaced, RRR with 4/6 SEM Abd: soft, non-tender, non-distended; no bruit; positive bowel sounds Ext: no edema, ecchymoses, or cyanosis Vascular: 2+ femoral pulses, 2+ radial pulses       Skin: warm and dry without rash Neuro: CN II-XII grossly intact; motor and sensory grossly intact    DATA AND STUDIES:  EKG: Normal sinus rhythm with left ventricular hypertrophy and strain  2D ECHO: August 2023: Severely calcified aortic valve with moderate to severe aortic stenosis with mean gradient 31 mmHg and moderate mitral stenosis with severe MAC with a mean gradient of 9 mmHg with ejection fraction of 60 to 65%  CARDIAC CATH: November 2022 minimal obstructive coronary artery disease with right dominant system  STS RISK CALCULATOR: 6.7%  NHYA CLASS: 2-3    ASSESSMENT AND PLAN:   Aortic valve stenosis, etiology of cardiac valve disease unspecified  Nonrheumatic mitral valve stenosis  Stage 3a chronic kidney disease ([place])  The patient has unfortunately suffered a pubic ramus fracture and subsequently a right hip fracture due to inefficient ambulation.  She is stable from a cardiovascular standpoint however.  Her fall was not associated with presyncope or syncope but was mechanical from tripping on something that was on the floor.  I think for now we should allow the patient's fractures to heal and for her to get  somewhat stronger before we proceed with an aortic valve intervention.  She agrees with this plan.  We will have the patient see either myself or structural heart APS in 2 months to evaluate how she was doing.  Her work-up for aortic valve intervention including CT scan and cardiac catheterization have been completed.  Early Osmond, MD  04/27/2022 3:08 PM  [facility] [place], [place], [place]  [address] Phone: [phone]; Fax: [phone]

## 2022-04-27 ENCOUNTER — Encounter: Payer: Self-pay | Admitting: Internal Medicine

## 2022-04-27 ENCOUNTER — Ambulatory Visit: Payer: Medicare HMO | Attending: Internal Medicine | Admitting: Internal Medicine

## 2022-04-27 VITALS — BP 116/60 | Ht 59.0 in | Wt 142.0 lb

## 2022-04-27 DIAGNOSIS — I342 Nonrheumatic mitral (valve) stenosis: Secondary | ICD-10-CM

## 2022-04-27 DIAGNOSIS — I350 Nonrheumatic aortic (valve) stenosis: Secondary | ICD-10-CM | POA: Diagnosis not present

## 2022-04-27 DIAGNOSIS — S32599A Other specified fracture of unspecified pubis, initial encounter for closed fracture: Secondary | ICD-10-CM

## 2022-04-27 DIAGNOSIS — S32591A Other specified fracture of right pubis, initial encounter for closed fracture: Secondary | ICD-10-CM | POA: Diagnosis not present

## 2022-04-27 DIAGNOSIS — N1831 Chronic kidney disease, stage 3a: Secondary | ICD-10-CM | POA: Diagnosis not present

## 2022-04-27 NOTE — Patient Instructions (Signed)
Medication Instructions:  Your physician recommends that you continue on your current medications as directed. Please refer to the Current Medication list given to you today.  *If you need a refill on your cardiac medications before your next appointment, please call your pharmacy*  Lab Work: NONE  Testing/Procedures: NONE  Follow-Up: At [facility], you and your health needs are our priority.  As part of our continuing mission to provide you with exceptional heart care, we have created designated Provider Care Teams.  These Care Teams include your primary Cardiologist (physician) and Advanced Practice Providers (APPs -  Physician Assistants and Nurse Practitioners) who all work together to provide you with the care you need, when you need it.  Your next appointment:   2 month(s)  The format for your next appointment:   In Person  Provider:   Early Osmond, MD  Important Information About Sugar

## 2022-04-30 ENCOUNTER — Telehealth: Payer: Self-pay | Admitting: Hematology and Oncology

## 2022-04-30 NOTE — Telephone Encounter (Signed)
Contacted patient to scheduled appointments. Patient is aware of appointments that are scheduled.   

## 2022-05-04 NOTE — Progress Notes (Signed)
Patient Care Team: Kathyrn Lass, MD as PCP - General Donato Heinz, MD as PCP - Cardiology (Cardiology) Early Osmond, MD as PCP - Structural Heart (Cardiology)  DIAGNOSIS:  Encounter Diagnoses  Name Primary?   Primary cancer of upper outer quadrant of left female breast ([place]) Yes   Anemia of chronic disease     SUMMARY OF ONCOLOGIC HISTORY: Oncology History  Primary cancer of upper outer quadrant of left female breast ([place])  12/18/2011 Surgery   Left mastectomy: 2 foci of well-differentiated invasive ductal carcinoma 1.7 cm, 0.7 cm with DCIS, lymphovascular invasion present, one positive SLN with ECS, ER/PR positive, HER-2 negative, Ki-67 13%; 0.7 cm tumor: ER 100, PR 7% Her 2Pos 4.94, Ki 50%   02/09/2012 - 02/06/2013 Chemotherapy   Adjuvant chemotherapy Taxol Herceptin X 10 discontinued due to intolerance followed by Herceptin maintenance   04/26/2012 - 06/10/2012 Radiation Therapy   Adjuvant radiation therapy   06/10/2012 - 04/06/2017 Anti-estrogen oral therapy   Letrozole 2.5 mg daily   11/15/2016 - 11/25/2016 Hospital Admission   Incarcerated incisional hernia status post small bowel resection and repair 11/17/2016     CHIEF COMPLIANT: Follow-up anemia  INTERVAL HISTORY: Brandy [name] is a 88 y.o. female is here because of recent diagnosis of anemia. She presents to the clinic today for a follow-up.  She is in a wheelchair after having fallen and fractured her pelvis and even slight fracture of the femur.  She is being followed by orthopedics.  She is currently in a nursing home and is very tearful because she does not want to be in the nursing home but she has no other choice.  She is complaining of profound fatigue and generalized weakness.  Since she left the hospital she has been anemic and anemia has gotten worse.  She is also complaining of itching of bilateral lower extremities along with bilateral lower extremity swelling and left arm swelling.   ALLERGIES:   has No Known Allergies.  MEDICATIONS:  Current Outpatient Medications  Medication Sig Dispense Refill   acetaminophen (TYLENOL) 500 MG tablet Take 1,000 mg by mouth every 6 (six) hours as needed for headache (pain).     alendronate (FOSAMAX) 70 MG tablet Take 70 mg by mouth every Sunday.     atorvastatin (LIPITOR) 40 MG tablet Take 40 mg by mouth at bedtime.     Cyanocobalamin (VITAMIN B-12 PO) Take 1 tablet by mouth every evening.     DULoxetine (CYMBALTA) 60 MG capsule Take 60 mg by mouth daily.     ferrous sulfate 325 (65 FE) MG tablet Take 1 tablet (325 mg total) by mouth daily.     gabapentin (NEURONTIN) 300 MG capsule Take 2 capsules (600 mg total) by mouth at bedtime. 90 capsule 3   Multiple Vitamin (MULTIVITAMIN WITH MINERALS) TABS tablet Take 1 tablet by mouth in the morning.     omeprazole (PRILOSEC) 40 MG capsule Take 1 capsule (40 mg total) by mouth daily before breakfast. 30 capsule 12   rOPINIRole (REQUIP) 0.25 MG tablet Take 0.25 mg by mouth at bedtime.     traMADol (ULTRAM) 50 MG tablet Take 1 tablet (50 mg total) by mouth 4 (four) times daily as needed for moderate pain (pain.). 5 tablet 0   valsartan-hydrochlorothiazide (DIOVAN-HCT) 80-12.5 MG tablet Take 1 tablet by mouth in the morning.     Vitamin D, Ergocalciferol, (DRISDOL) 50000 units CAPS capsule Take 50,000 Units by mouth every Sunday.     No  current facility-administered medications for this visit.   Facility-Administered Medications Ordered in Other Visits  Medication Dose Route Frequency Provider Last Rate Last Admin   sodium chloride 0.9 % injection 10 mL  10 mL Intracatheter PRN Marcy Panning, MD   10 mL at 03/14/12 1628    PHYSICAL EXAMINATION: ECOG PERFORMANCE STATUS: 1 - Symptomatic but completely ambulatory  Vitals:   05/11/22 0938  BP: (!) 131/56  Pulse: 80  Resp: 18  Temp: 97.9 F (36.6 C)  SpO2: 94%   Filed Weights   05/11/22 0938  Weight: 148 lb 14.4 oz (67.5 kg)      LABORATORY DATA:   I have reviewed the data as listed    Latest Ref Rng & Units 03/28/2022    3:23 AM 03/27/2022    3:49 AM 03/26/2022    9:47 PM  CMP  Glucose 70 - 99 mg/dL 120  114  111   BUN 8 - 23 mg/dL _0 Creatinine 0.44 - 1.00 mg/dL 1.06  0.98  1.08   Sodium 135 - 145 mmol/L 143  140  141   Potassium 3.5 - 5.1 mmol/L 4.3  4.0  4.2   Chloride 98 - 111 mmol/L 114  115  115   CO2 22 - 32 mmol/L _1 Calcium 8.9 - 10.3 mg/dL 8.8  8.8  9.1   Total Protein 6.5 - 8.1 g/dL  6.1    Total Bilirubin 0.3 - 1.2 mg/dL  0.6    Alkaline Phos 38 - 126 U/L  67    AST 15 - 41 U/L  21    ALT 0 - 44 U/L  22      Lab Results  Component Value Date   WBC 6.9 05/11/2022   HGB 7.9 (L) 05/11/2022   HCT 25.3 (L) 05/11/2022   MCV 93.4 05/11/2022   PLT 213 05/11/2022   NEUTROABS 3.5 05/11/2022    ASSESSMENT & PLAN:  Primary cancer of upper outer quadrant of left female breast 2013: Left mastectomy 2 foci of well-differentiated IDC 1.7 cm, 0.7 cm with DCIS 1 positive sentinel lymph node with extracapsular extension ER/PR positive HER2 negative Ki-67 13% 02/2012-02/2013: Taxol Herceptin x10 followed by Herceptin maintenance 06/10/2012: Adjuvant radiation 06/10/2012 to 04/06/2017: Letrozole  Anemia of chronic disease 08/19/2021: Hemoglobin 10.3, MCV 95 09/03/2021: Hemoglobin 9.7, MCV 90.2, GFR 44% 12/10/2021: Hemoglobin 9.1, MCV 90.6, creatinine 1.18, GFR 45% calcium 9.2, albumin 3.7 (takes oral iron), ferritin 124, iron saturation 15%, TIBC 251, B12 364 01/17/2020: WBC 7.7, hemoglobin 9.5, platelets 192, ANC 5.1, DAT negative, erythropoietin 15.2, haptoglobin 218, SPEP: 0.6 g of M protein, LDH 164, haptoglobin 218 01/31/2022: Bone survey: No lytic or sclerotic lesions noted. 03/28/2022: Hemoglobin 8.4 05/11/2022: Hemoglobin 7.9  Plan: Transfuse 1 unit of PRBC. Bilateral lower extremity and left upper arm swelling: Will obtain ultrasound of both lower extremities and the left arm. Even if she has a blood  clot, I do not think she would be a safe candidate for blood thinners because of her frequent falls.  We may at that time consider an IVC filter as an alternative.  Hospitalization 03/26/2022-03/30/2022: Fall and fracture of the right inferior/superior pubic rami and right sacral ala She is immobile and is living in a nursing home.  She is very sad about giving up on her home.  I would like to see her back in 3 months with labs   Orders Placed This Encounter  Procedures   CBC with Differential ([facility] Only)    Standing Status:   Future    Standing Expiration Date:   05/12/2023   Sample to Blood Bank    Standing Status:   Future    Number of Occurrences:   1    Standing Expiration Date:   05/12/2023   The patient has a good understanding of the overall plan. she agrees with it. she will call with any problems that may develop before the next visit here. Total time spent: 30 mins including face to face time and time spent for planning, charting and co-ordination of care   Harriette Ohara, MD 05/11/22    I Gardiner Coins am scribing for Dr. Lindi Adie  I have reviewed the above documentation for accuracy and completeness, and I agree with the above.

## 2022-05-05 DIAGNOSIS — F5105 Insomnia due to other mental disorder: Secondary | ICD-10-CM | POA: Diagnosis not present

## 2022-05-05 DIAGNOSIS — F4323 Adjustment disorder with mixed anxiety and depressed mood: Secondary | ICD-10-CM | POA: Diagnosis not present

## 2022-05-07 ENCOUNTER — Inpatient Hospital Stay: Payer: Medicare HMO | Admitting: Hematology and Oncology

## 2022-05-07 ENCOUNTER — Inpatient Hospital Stay: Payer: Medicare HMO

## 2022-05-07 DIAGNOSIS — R600 Localized edema: Secondary | ICD-10-CM | POA: Diagnosis not present

## 2022-05-07 DIAGNOSIS — I1310 Hypertensive heart and chronic kidney disease without heart failure, with stage 1 through stage 4 chronic kidney disease, or unspecified chronic kidney disease: Secondary | ICD-10-CM | POA: Diagnosis not present

## 2022-05-07 DIAGNOSIS — S32591D Other specified fracture of right pubis, subsequent encounter for fracture with routine healing: Secondary | ICD-10-CM | POA: Diagnosis not present

## 2022-05-11 ENCOUNTER — Ambulatory Visit (HOSPITAL_BASED_OUTPATIENT_CLINIC_OR_DEPARTMENT_OTHER)
Admission: RE | Admit: 2022-05-11 | Discharge: 2022-05-11 | Disposition: A | Discharge status: Home / Self Care | Payer: Medicare HMO | Source: Ambulatory Visit | Attending: Hematology and Oncology | Admitting: Hematology and Oncology

## 2022-05-11 ENCOUNTER — Ambulatory Visit: Payer: Medicare HMO

## 2022-05-11 ENCOUNTER — Other Ambulatory Visit: Payer: Self-pay

## 2022-05-11 ENCOUNTER — Inpatient Hospital Stay: Payer: Medicare HMO

## 2022-05-11 ENCOUNTER — Other Ambulatory Visit: Payer: Self-pay | Admitting: *Deleted

## 2022-05-11 ENCOUNTER — Inpatient Hospital Stay: Payer: Medicare HMO | Attending: Hematology and Oncology

## 2022-05-11 ENCOUNTER — Inpatient Hospital Stay: Payer: Medicare HMO | Admitting: Hematology and Oncology

## 2022-05-11 VITALS — BP 131/56 | HR 80 | Temp 97.9°F | Resp 18 | Ht 59.0 in | Wt 148.9 lb

## 2022-05-11 DIAGNOSIS — Z170 Estrogen receptor positive status [ER+]: Secondary | ICD-10-CM | POA: Insufficient documentation

## 2022-05-11 DIAGNOSIS — R5383 Other fatigue: Secondary | ICD-10-CM | POA: Diagnosis not present

## 2022-05-11 DIAGNOSIS — Z79899 Other long term (current) drug therapy: Secondary | ICD-10-CM | POA: Insufficient documentation

## 2022-05-11 DIAGNOSIS — C50412 Malignant neoplasm of upper-outer quadrant of left female breast: Secondary | ICD-10-CM

## 2022-05-11 DIAGNOSIS — D638 Anemia in other chronic diseases classified elsewhere: Secondary | ICD-10-CM

## 2022-05-11 DIAGNOSIS — D649 Anemia, unspecified: Secondary | ICD-10-CM

## 2022-05-11 DIAGNOSIS — M7989 Other specified soft tissue disorders: Secondary | ICD-10-CM | POA: Insufficient documentation

## 2022-05-11 DIAGNOSIS — D472 Monoclonal gammopathy: Secondary | ICD-10-CM

## 2022-05-11 DIAGNOSIS — Z79811 Long term (current) use of aromatase inhibitors: Secondary | ICD-10-CM | POA: Diagnosis not present

## 2022-05-11 DIAGNOSIS — R531 Weakness: Secondary | ICD-10-CM | POA: Insufficient documentation

## 2022-05-11 DIAGNOSIS — D6481 Anemia due to antineoplastic chemotherapy: Secondary | ICD-10-CM | POA: Insufficient documentation

## 2022-05-11 DIAGNOSIS — K430 Incisional hernia with obstruction, without gangrene: Secondary | ICD-10-CM | POA: Diagnosis not present

## 2022-05-11 LAB — CBC WITH DIFFERENTIAL (CANCER CENTER ONLY)
Abs Immature Granulocytes: 0.03 10*3/uL (ref 0.00–0.07)
Basophils Absolute: 0 10*3/uL (ref 0.0–0.1)
Basophils Relative: 0 %
Eosinophils Absolute: 0.2 10*3/uL (ref 0.0–0.5)
Eosinophils Relative: 3 %
HCT: 25.3 % — ABNORMAL LOW (ref 36.0–46.0)
Hemoglobin: 7.9 g/dL — ABNORMAL LOW (ref 12.0–15.0)
Immature Granulocytes: 0 %
Lymphocytes Relative: 37 %
Lymphs Abs: 2.5 10*3/uL (ref 0.7–4.0)
MCH: 29.2 pg (ref 26.0–34.0)
MCHC: 31.2 g/dL (ref 30.0–36.0)
MCV: 93.4 fL (ref 80.0–100.0)
Monocytes Absolute: 0.7 10*3/uL (ref 0.1–1.0)
Monocytes Relative: 10 %
Neutro Abs: 3.5 10*3/uL (ref 1.7–7.7)
Neutrophils Relative %: 50 %
Platelet Count: 213 10*3/uL (ref 150–400)
RBC: 2.71 MIL/uL — ABNORMAL LOW (ref 3.87–5.11)
RDW: 14.5 % (ref 11.5–15.5)
WBC Count: 6.9 10*3/uL (ref 4.0–10.5)
nRBC: 0 % (ref 0.0–0.2)

## 2022-05-11 LAB — SAMPLE TO BLOOD BANK

## 2022-05-11 LAB — PREPARE RBC (CROSSMATCH)

## 2022-05-11 MED ORDER — SODIUM CHLORIDE 0.9% IV SOLUTION
250.0000 mL | Freq: Once | INTRAVENOUS | Status: AC
  Administered 2022-05-11: 250 mL via INTRAVENOUS

## 2022-05-11 MED ORDER — DIPHENHYDRAMINE HCL 25 MG PO CAPS
25.0000 mg | ORAL_CAPSULE | Freq: Once | ORAL | Status: AC
  Administered 2022-05-11: 25 mg via ORAL
  Filled 2022-05-11: qty 1

## 2022-05-11 MED ORDER — ACETAMINOPHEN 325 MG PO TABS
650.0000 mg | ORAL_TABLET | Freq: Once | ORAL | Status: AC
  Administered 2022-05-11: 650 mg via ORAL
  Filled 2022-05-11: qty 2

## 2022-05-11 NOTE — Assessment & Plan Note (Signed)
2013: Left mastectomy 2 foci of well-differentiated IDC 1.7 cm, 0.7 cm with DCIS 1 positive sentinel lymph node with extracapsular extension ER/PR positive HER2 negative Ki-67 13% 02/2012-02/2013: Taxol Herceptin x10 followed by Herceptin maintenance 06/10/2012: Adjuvant radiation 06/10/2012 to 04/06/2017: Letrozole

## 2022-05-11 NOTE — Assessment & Plan Note (Signed)
08/19/2021: Hemoglobin 10.3, MCV 95 09/03/2021: Hemoglobin 9.7, MCV 90.2, GFR 44% 12/10/2021: Hemoglobin 9.1, MCV 90.6, creatinine 1.18, GFR 45% calcium 9.2, albumin 3.7 (takes oral iron), ferritin 124, iron saturation 15%, TIBC 251, B12 364 01/17/2020: WBC 7.7, hemoglobin 9.5, platelets 192, ANC 5.1, DAT negative, erythropoietin 15.2, haptoglobin 218, SPEP: 0.6 g of M protein, LDH 164, haptoglobin 218 01/31/2022: Bone survey: No lytic or sclerotic lesions noted. 03/28/2022: Hemoglobin 8.4  Hospitalization 03/26/2022-03/30/2022: Fall and fracture of the right inferior/superior pubic rami and right sacral ala

## 2022-05-11 NOTE — Patient Instructions (Signed)
Blood Transfusion, Adult, Care After The following information offers guidance on how to care for yourself after your procedure. Your health care provider may also give you more specific instructions. If you have problems or questions, contact your health care provider. What can I expect after the procedure? After the procedure, it is common to have: Bruising and soreness where the IV was inserted. A headache. Follow these instructions at home: IV insertion site care     Follow instructions from your health care provider about how to take care of your IV insertion site. Make sure you: Wash your hands with soap and water for at least 20 seconds before and after you change your bandage (dressing). If soap and water are not available, use hand sanitizer. Change your dressing as told by your health care provider. Check your IV insertion site every day for signs of infection. Check for: Redness, swelling, or pain. Bleeding from the site. Warmth. Pus or a bad smell. General instructions Take over-the-counter and prescription medicines only as told by your health care provider. Rest as told by your health care provider. Return to your normal activities as told by your health care provider. Keep all follow-up visits. Lab tests may need to be done at certain periods to recheck your blood counts. Contact a health care provider if: You have itching or red, swollen areas of skin (hives). You have a fever or chills. You have pain in the head, back, or chest. You feel anxious or you feel weak after doing your normal activities. You have redness, swelling, warmth, or pain around the IV insertion site. You have blood coming from the IV insertion site that does not stop with pressure. You have pus or a bad smell coming from your IV insertion site. If you received your blood transfusion in an outpatient setting, you will be told whom to contact to report any reactions. Get help right away if: You  have symptoms of a serious allergic or immune system reaction, including: Trouble breathing or shortness of breath. Swelling of the face, feeling flushed, or widespread rash. Dark urine or blood in the urine. Fast heartbeat. These symptoms may be an emergency. Get help right away. Call 911. Do not wait to see if the symptoms will go away. Do not drive yourself to the hospital. Summary Bruising and soreness around the IV insertion site are common. Check your IV insertion site every day for signs of infection. Rest as told by your health care provider. Return to your normal activities as told by your health care provider. Get help right away for symptoms of a serious allergic or immune system reaction to the blood transfusion. This information is not intended to replace advice given to you by your health care provider. Make sure you discuss any questions you have with your health care provider. Document Revised: 09/19/2021 Document Reviewed: 09/19/2021 Elsevier Patient Education  2023 Elsevier Inc.  

## 2022-05-11 NOTE — Progress Notes (Signed)
Left upper extremity venous duplex and bilateral lower extremity venous duplex has been completed. Preliminary results can be found in CV Proc through chart review.  Results were given to Dr. Lindi Adie.  05/11/22 11:14 AM Brandy [name] RVT

## 2022-05-12 DIAGNOSIS — Z23 Encounter for immunization: Secondary | ICD-10-CM | POA: Diagnosis not present

## 2022-05-12 LAB — TYPE AND SCREEN
ABO/RH(D): O POS
Antibody Screen: NEGATIVE
Unit division: 0

## 2022-05-12 LAB — BPAM RBC
Blood Product Expiration Date: 202312042359
ISSUE DATE / TIME: 202311061309
Unit Type and Rh: 5100

## 2022-05-14 DIAGNOSIS — S32591D Other specified fracture of right pubis, subsequent encounter for fracture with routine healing: Secondary | ICD-10-CM | POA: Diagnosis not present

## 2022-05-15 DIAGNOSIS — I1310 Hypertensive heart and chronic kidney disease without heart failure, with stage 1 through stage 4 chronic kidney disease, or unspecified chronic kidney disease: Secondary | ICD-10-CM | POA: Diagnosis not present

## 2022-05-15 DIAGNOSIS — Z79899 Other long term (current) drug therapy: Secondary | ICD-10-CM | POA: Diagnosis not present

## 2022-05-15 DIAGNOSIS — R600 Localized edema: Secondary | ICD-10-CM | POA: Diagnosis not present

## 2022-05-19 DIAGNOSIS — Z79899 Other long term (current) drug therapy: Secondary | ICD-10-CM | POA: Diagnosis not present

## 2022-05-26 ENCOUNTER — Ambulatory Visit: Payer: Medicare HMO | Admitting: Gastroenterology

## 2022-06-04 DIAGNOSIS — Z23 Encounter for immunization: Secondary | ICD-10-CM | POA: Diagnosis not present

## 2022-06-04 DIAGNOSIS — Z79899 Other long term (current) drug therapy: Secondary | ICD-10-CM | POA: Diagnosis not present

## 2022-06-04 DIAGNOSIS — G4489 Other headache syndrome: Secondary | ICD-10-CM | POA: Diagnosis not present

## 2022-06-04 DIAGNOSIS — S199XXA Unspecified injury of neck, initial encounter: Secondary | ICD-10-CM | POA: Diagnosis not present

## 2022-06-04 DIAGNOSIS — Z853 Personal history of malignant neoplasm of breast: Secondary | ICD-10-CM | POA: Diagnosis not present

## 2022-06-04 DIAGNOSIS — S12112A Nondisplaced Type II dens fracture, initial encounter for closed fracture: Secondary | ICD-10-CM | POA: Diagnosis not present

## 2022-06-04 DIAGNOSIS — S0101XA Laceration without foreign body of scalp, initial encounter: Secondary | ICD-10-CM | POA: Diagnosis not present

## 2022-06-04 DIAGNOSIS — S6992XA Unspecified injury of left wrist, hand and finger(s), initial encounter: Secondary | ICD-10-CM | POA: Diagnosis not present

## 2022-06-04 DIAGNOSIS — S0181XA Laceration without foreign body of other part of head, initial encounter: Secondary | ICD-10-CM | POA: Diagnosis not present

## 2022-06-04 DIAGNOSIS — W19XXXA Unspecified fall, initial encounter: Secondary | ICD-10-CM | POA: Diagnosis not present

## 2022-06-04 DIAGNOSIS — M810 Age-related osteoporosis without current pathological fracture: Secondary | ICD-10-CM | POA: Diagnosis not present

## 2022-06-04 DIAGNOSIS — I10 Essential (primary) hypertension: Secondary | ICD-10-CM | POA: Diagnosis not present

## 2022-06-04 DIAGNOSIS — S0990XA Unspecified injury of head, initial encounter: Secondary | ICD-10-CM | POA: Diagnosis not present

## 2022-06-04 DIAGNOSIS — R296 Repeated falls: Secondary | ICD-10-CM | POA: Diagnosis not present

## 2022-06-04 DIAGNOSIS — S12101A Unspecified nondisplaced fracture of second cervical vertebra, initial encounter for closed fracture: Secondary | ICD-10-CM | POA: Diagnosis not present

## 2022-06-04 DIAGNOSIS — R58 Hemorrhage, not elsewhere classified: Secondary | ICD-10-CM | POA: Diagnosis not present

## 2022-06-04 DIAGNOSIS — S12100A Unspecified displaced fracture of second cervical vertebra, initial encounter for closed fracture: Secondary | ICD-10-CM | POA: Diagnosis not present

## 2022-06-08 DIAGNOSIS — M6281 Muscle weakness (generalized): Secondary | ICD-10-CM | POA: Diagnosis not present

## 2022-06-09 DIAGNOSIS — Z79899 Other long term (current) drug therapy: Secondary | ICD-10-CM | POA: Diagnosis not present

## 2022-06-09 DIAGNOSIS — S12101A Unspecified nondisplaced fracture of second cervical vertebra, initial encounter for closed fracture: Secondary | ICD-10-CM | POA: Diagnosis not present

## 2022-06-09 DIAGNOSIS — F4323 Adjustment disorder with mixed anxiety and depressed mood: Secondary | ICD-10-CM | POA: Diagnosis not present

## 2022-06-09 DIAGNOSIS — R52 Pain, unspecified: Secondary | ICD-10-CM | POA: Diagnosis not present

## 2022-06-09 DIAGNOSIS — M6281 Muscle weakness (generalized): Secondary | ICD-10-CM | POA: Diagnosis not present

## 2022-06-09 DIAGNOSIS — F5105 Insomnia due to other mental disorder: Secondary | ICD-10-CM | POA: Diagnosis not present

## 2022-06-10 DIAGNOSIS — Z8781 Personal history of (healed) traumatic fracture: Secondary | ICD-10-CM | POA: Diagnosis not present

## 2022-06-10 DIAGNOSIS — M47812 Spondylosis without myelopathy or radiculopathy, cervical region: Secondary | ICD-10-CM | POA: Diagnosis not present

## 2022-06-10 DIAGNOSIS — M6281 Muscle weakness (generalized): Secondary | ICD-10-CM | POA: Diagnosis not present

## 2022-06-10 DIAGNOSIS — M2578 Osteophyte, vertebrae: Secondary | ICD-10-CM | POA: Diagnosis not present

## 2022-06-10 DIAGNOSIS — M5030 Other cervical disc degeneration, unspecified cervical region: Secondary | ICD-10-CM | POA: Diagnosis not present

## 2022-06-10 DIAGNOSIS — S12120A Other displaced dens fracture, initial encounter for closed fracture: Secondary | ICD-10-CM | POA: Diagnosis not present

## 2022-06-10 DIAGNOSIS — S12131A Unspecified traumatic nondisplaced spondylolisthesis of second cervical vertebra, initial encounter for closed fracture: Secondary | ICD-10-CM | POA: Diagnosis not present

## 2022-06-11 DIAGNOSIS — M6281 Muscle weakness (generalized): Secondary | ICD-10-CM | POA: Diagnosis not present

## 2022-06-12 DIAGNOSIS — M6281 Muscle weakness (generalized): Secondary | ICD-10-CM | POA: Diagnosis not present

## 2022-06-15 DIAGNOSIS — M6281 Muscle weakness (generalized): Secondary | ICD-10-CM | POA: Diagnosis not present

## 2022-06-16 DIAGNOSIS — M6281 Muscle weakness (generalized): Secondary | ICD-10-CM | POA: Diagnosis not present

## 2022-06-17 DIAGNOSIS — M6281 Muscle weakness (generalized): Secondary | ICD-10-CM | POA: Diagnosis not present

## 2022-06-18 DIAGNOSIS — M6281 Muscle weakness (generalized): Secondary | ICD-10-CM | POA: Diagnosis not present

## 2022-06-19 DIAGNOSIS — S12131A Unspecified traumatic nondisplaced spondylolisthesis of second cervical vertebra, initial encounter for closed fracture: Secondary | ICD-10-CM | POA: Diagnosis not present

## 2022-06-19 DIAGNOSIS — S12120A Other displaced dens fracture, initial encounter for closed fracture: Secondary | ICD-10-CM | POA: Diagnosis not present

## 2022-06-19 DIAGNOSIS — M6281 Muscle weakness (generalized): Secondary | ICD-10-CM | POA: Diagnosis not present

## 2022-06-22 DIAGNOSIS — M6281 Muscle weakness (generalized): Secondary | ICD-10-CM | POA: Diagnosis not present

## 2022-06-23 DIAGNOSIS — E559 Vitamin D deficiency, unspecified: Secondary | ICD-10-CM | POA: Diagnosis not present

## 2022-06-23 DIAGNOSIS — M6281 Muscle weakness (generalized): Secondary | ICD-10-CM | POA: Diagnosis not present

## 2022-06-24 DIAGNOSIS — M6281 Muscle weakness (generalized): Secondary | ICD-10-CM | POA: Diagnosis not present

## 2022-06-25 DIAGNOSIS — S32591D Other specified fracture of right pubis, subsequent encounter for fracture with routine healing: Secondary | ICD-10-CM | POA: Diagnosis not present

## 2022-06-25 DIAGNOSIS — M6281 Muscle weakness (generalized): Secondary | ICD-10-CM | POA: Diagnosis not present

## 2022-06-26 DIAGNOSIS — M6281 Muscle weakness (generalized): Secondary | ICD-10-CM | POA: Diagnosis not present

## 2022-06-28 DIAGNOSIS — K219 Gastro-esophageal reflux disease without esophagitis: Secondary | ICD-10-CM | POA: Diagnosis not present

## 2022-06-28 DIAGNOSIS — S0990XA Unspecified injury of head, initial encounter: Secondary | ICD-10-CM | POA: Diagnosis not present

## 2022-06-28 DIAGNOSIS — D649 Anemia, unspecified: Secondary | ICD-10-CM | POA: Diagnosis not present

## 2022-06-28 DIAGNOSIS — S60811A Abrasion of right wrist, initial encounter: Secondary | ICD-10-CM | POA: Diagnosis not present

## 2022-06-28 DIAGNOSIS — Z853 Personal history of malignant neoplasm of breast: Secondary | ICD-10-CM | POA: Diagnosis not present

## 2022-06-28 DIAGNOSIS — W19XXXA Unspecified fall, initial encounter: Secondary | ICD-10-CM | POA: Diagnosis not present

## 2022-06-28 DIAGNOSIS — S0083XA Contusion of other part of head, initial encounter: Secondary | ICD-10-CM | POA: Diagnosis not present

## 2022-06-28 DIAGNOSIS — G2581 Restless legs syndrome: Secondary | ICD-10-CM | POA: Diagnosis not present

## 2022-06-28 DIAGNOSIS — Z79899 Other long term (current) drug therapy: Secondary | ICD-10-CM | POA: Diagnosis not present

## 2022-06-28 DIAGNOSIS — T1490XA Injury, unspecified, initial encounter: Secondary | ICD-10-CM | POA: Diagnosis not present

## 2022-06-28 DIAGNOSIS — I10 Essential (primary) hypertension: Secondary | ICD-10-CM | POA: Diagnosis not present

## 2022-07-07 NOTE — Progress Notes (Unsigned)
Brandy [name] MRN: [id number] DOB/AGE: Jul 03, 1933 89 y.o.  Primary Care Physician:Miller, Lattie Haw, MD Primary Cardiologist: Gayla Doss, MD   PROBLEM LIST: 1.  Moderate to severe aortic stenosis with aortic valve area of 0.76 cm and mean gradient 31 mmHg with a normal ejection fraction; no conduction abnormalities 2.  Hypertension 3.  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  Frailty and ambulates with walker 5.  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  CKD stage III 7.  Iron deficiency anemia   HISTORY OF PRESENT ILLNESS:  November 2023: The patient returns for follow-up.  I saw her last in the end of August.  At that point in time she was more dyspneic and was more amenable to pursuing potential aortic valve intervention.  She was referred for CT scan.  Unfortunately she was admitted about a month ago with pubic ramus fracture.  Apparently she tripped when she was ambulating with her walker.  She fell backwards hitting her right hip and buttocks but did not hit her head.  She was evaluated by orthopedic surgery and no acute surgical intervention was required.  Unfortunately because she was ambulating awkwardly she fractured her right hip as well and this was realized last week.  Because of this she is on strict bedrest to allow all of her fractures to heal before she rehabilitates.  In regards to cardiovascular symptoms she denies any shortness of breath again because she is not ambulating very much.  She denies any exertional angina.  She has had no severe peripheral edema, paroxysmal nocturnal dyspnea, orthopnea.  She has been relatively stable from a cardiovascular standpoint.  Plan: Defer aortic valve intervention for now due to allow recuperation from orthopedic issues; follow-up in 2 months.  Today: Patient returns for routine follow-up.  She unfortunately has suffered a few more falls  not due to dizziness.  She also developed a neck fracture in early December.  She did not need hospitalization for this and is in a neck brace.  She is here with her daughter.  She is at a rehab facility.  She does not do much in a day.  She is unsteady on her feet and in wheelchair most of the day.  She denies any shortness of breath or chest discomfort.  Past Medical History:  Diagnosis Date   Anemia    Arthritis    shoulders   Asthma    mild, no inhalers used   Breast cancer ([place]) 12/18/2011   left breast masectomy=metastatic ca in (1/1) lymph node ,invasive ductal ca,2 foci,,dcis,lymph ovascular invasion identified,surgical resection margins neg for ca,additional tissue=benign skin and subcutaneous tissue   Bronchitis    hx of;last time >55yrago   Depression    takes Celexa daily   Full dentures    Gall stones 2015   GERD (gastroesophageal reflux disease)    takes Prilosec prn   H/O hiatal hernia    History of kidney stones    many yrs ago   Hx of radiation therapy 04/26/12 -06/10/12   left breast   Hyperlipidemia    takes Simvastatin daily   Hypertension    takes Amlodipine and Diovan daily   Insomnia    takes Ambien nightly   Urinary urgency     Past Surgical History:  Procedure Laterality Date   APPENDECTOMY     BILIARY DILATION  02/07/2022   Procedure:  BILIARY DILATION;  Surgeon: Jackquline Denmark, MD;  Location: [facility];  Service: Gastroenterology;;   BILIARY DILATION  03/19/2022   Procedure: BILIARY DILATION;  Surgeon: Irving Copas., MD;  Location: [facility];  Service: Gastroenterology;;   BILIARY STENT PLACEMENT  02/07/2022   Procedure: BILIARY STENT PLACEMENT;  Surgeon: Jackquline Denmark, MD;  Location: [place];  Service: Gastroenterology;;   BIOPSY  03/19/2022   Procedure: BIOPSY;  Surgeon: Irving Copas., MD;  Location: [facility];  Service: Gastroenterology;;   bladder tack     BREAST BIOPSY  1998   left    DILATION AND CURETTAGE OF  UTERUS     ENDOSCOPIC RETROGRADE CHOLANGIOPANCREATOGRAPHY (ERCP) WITH PROPOFOL N/A 02/01/2014   Procedure: ENDOSCOPIC RETROGRADE CHOLANGIOPANCREATOGRAPHY (ERCP) WITH PROPOFOL;  Surgeon: [name], MD;  [facility];  Service: Endoscopy;  Laterality: N/A;   ENDOSCOPIC RETROGRADE CHOLANGIOPANCREATOGRAPHY (ERCP) WITH PROPOFOL N/A 03/19/2022   Procedure: ENDOSCOPIC RETROGRADE CHOLANGIOPANCREATOGRAPHY (ERCP) WITH PROPOFOL;  Surgeon: Rush Landmark Telford Nab., MD;  [facility];  Service: Gastroenterology;  Laterality: N/A;   ERCP N/A 02/07/2022   Procedure: ENDOSCOPIC RETROGRADE CHOLANGIOPANCREATOGRAPHY (ERCP);  Surgeon: Jackquline Denmark, MD;  Location: [facility];  Service: Gastroenterology;  Laterality: N/A;   ESOPHAGOGASTRODUODENOSCOPY     ESOPHAGOGASTRODUODENOSCOPY N/A 06/26/2016   Procedure: ESOPHAGOGASTRODUODENOSCOPY (EGD);  Surgeon: [name], MD;  Location: [place];  Service: Endoscopy;  Laterality: N/A;   ESOPHAGOGASTRODUODENOSCOPY (EGD) WITH PROPOFOL  12/19/2013   Procedure: ESOPHAGOGASTRODUODENOSCOPY (EGD) WITH PROPOFOL;  Surgeon: Beryle Beams, MD;  Location: [place];  Service: Endoscopy;;   ESOPHAGOGASTRODUODENOSCOPY (EGD) WITH PROPOFOL N/A 09/10/2016   Procedure: ESOPHAGOGASTRODUODENOSCOPY (EGD) WITH PROPOFOL;  Surgeon: [name], MD;  [facility];  Service: Endoscopy;  Laterality: N/A;   EUS  06/04/2011   Procedure: UPPER ENDOSCOPIC ULTRASOUND (EUS) LINEAR;  Surgeon: Owens Loffler, MD;  [facility];  Service: Endoscopy;  Laterality: N/A;   EUS N/A 02/01/2014   Procedure: UPPER ENDOSCOPIC ULTRASOUND (EUS) LINEAR;  Surgeon: [name], MD;  [facility];  Service: Endoscopy;  Laterality: N/A;   EXPLORATORY LAPAROTOMY      biopsy of intra-abdominal mass   INTRAMEDULLARY (IM) NAIL INTERTROCHANTERIC Right 10/24/2018   Procedure: INTRAMEDULLARY (IM) NAIL INTERTROCHANTRIC;  Surgeon: Gaynelle Arabian, MD;  [facility];  Service: Orthopedics;  Laterality: Right;   LAPAROTOMY N/A 11/17/2016   Procedure: EXPLORATORY LAPAROTOMY WITH LYSIS OF ADHESIONS, SMALL BOWEL RESECTION, REPAIR OF INCARCERATED VENTRAL INCISIONAL HERNIA, INSERTION OF BIOLOGIC MESH PATCH,APPLICATION OF WOUND VAC DRESSING;  Surgeon: Armandina Gemma, MD;  [facility];  Service: General;  Laterality: N/A;   MASTECTOMY W/ SENTINEL NODE BIOPSY  12/18/2011   Procedure: MASTECTOMY WITH SENTINEL LYMPH NODE BIOPSY;  Surgeon: Rolm Bookbinder, MD;  Location: [place];  Service: General;  Laterality: Left;   PORT-A-CATH REMOVAL Right 06/15/2013   Procedure: REMOVAL PORT-A-CATH;  Surgeon: Rolm Bookbinder, MD;  Location: [place];  Service: General;  Laterality: Right;   PORTACATH PLACEMENT  01/27/2012   Procedure: INSERTION PORT-A-CATH;  Surgeon: Rolm Bookbinder, MD;  Location: [place];  Service: General;  Laterality: Right;  PORT PLACEMENT   REMOVAL OF STONES  02/07/2022   Procedure: REMOVAL OF STONES;  Surgeon: Jackquline Denmark, MD;  Location: [place];  Service: Gastroenterology;;   REMOVAL OF STONES  03/19/2022   Procedure: REMOVAL OF STONES;  Surgeon: Irving Copas., MD;  [facility];  Service: Gastroenterology;;   RIGHT/LEFT HEART CATH AND CORONARY ANGIOGRAPHY N/A 05/08/2021  Procedure: RIGHT/LEFT HEART CATH AND CORONARY ANGIOGRAPHY;  Surgeon: Jettie Booze, MD;  Location: [facility];  Service: Cardiovascular;  Laterality: N/A;   SPHINCTEROTOMY  02/07/2022   Procedure: SPHINCTEROTOMY;  Surgeon: Jackquline Denmark, MD;  Location: [facility];  Service: Gastroenterology;;   Bess Kinds CHOLANGIOSCOPY N/A 03/19/2022   Procedure: GBEEFEOF CHOLANGIOSCOPY;  Surgeon: Irving Copas., MD;  [facility];  Service: Gastroenterology;  Laterality: N/A;   STENT REMOVAL  03/19/2022   Procedure: STENT REMOVAL;  Surgeon: Irving Copas., MD;  Location: [facility];  Service:  Gastroenterology;;   TOTAL HIP ARTHROPLASTY Right 11/14/2018   Procedure: REMOVAL OF INTRAMEDULLARY NAIL AND POSTERIOR TOTAL HIP ARTHROPLASTY;  Surgeon: Gaynelle Arabian, MD;  [facility];  Service: Orthopedics;  Laterality: Right;   TOTAL SHOULDER REPLACEMENT  2011   left   TUBAL LIGATION      Family History  Problem Relation Age of Onset   Prostate cancer Father    Breast cancer Other     Social History   Socioeconomic History   Marital status: Widowed    Spouse name: Not on file   Number of children: Not on file   Years of education: Not on file   Highest education level: Not on file  Occupational History   Occupation: retired  Tobacco Use   Smoking status: Never   Smokeless tobacco: Never  Vaping Use   Vaping Use: Never used  Substance and Sexual Activity   Alcohol use: No   Drug use: No   Sexual activity: Yes    Birth control/protection: Surgical    Comment: mensus age 18, 45st pregnancy 7, no hrt gg4,p3, 1 babay lived a few hours complications  Other Topics Concern   Not on file  Social History Narrative   Not on file   Social Determinants of Health   Financial Resource Strain: Not on file  Food Insecurity: No Food Insecurity (03/27/2022)   Hunger Vital Sign    Worried About Running Out of Food in the Last Year: Never true    Ran Out of Food in the Last Year: Never true  Transportation Needs: No Transportation Needs (03/27/2022)   PRAPARE - Hydrologist (Medical): No    Lack of Transportation (Non-Medical): No  Physical Activity: Unknown (10/29/2018)   Exercise Vital Sign    Days of Exercise per Week: Patient refused    Minutes of Exercise per Session: Patient refused  Stress: Not on file  Social Connections: Unknown (10/29/2018)   Social Connection and Isolation Panel [NHANES]    Frequency of Communication with Friends and Family: Patient refused    Frequency of Social Gatherings with Friends and Family: Patient refused     Attends Religious Services: Patient refused    Active Member of Clubs or Organizations: Patient refused    Attends Archivist Meetings: Patient refused    Marital Status: Patient refused  Intimate Partner Violence: Not At Risk (03/27/2022)   Humiliation, Afraid, Rape, and Kick questionnaire    Fear of Current or Ex-Partner: No    Emotionally Abused: No    Physically Abused: No    Sexually Abused: No     Prior to Admission medications   Medication Sig Start Date End Date Taking? Authorizing Provider  acetaminophen (TYLENOL) 500 MG tablet Take 1,000 mg by mouth every 6 (six) hours as needed (pain).    [provider]  alendronate (FOSAMAX) 70 MG tablet Take 70 mg by mouth once  a week. sunday 04/01/21   [provider]  amoxicillin-clavulanate (AUGMENTIN) 875-125 MG tablet Take 1 tablet by mouth 2 (two) times daily for 3 days. 05/09/21 05/12/21  Jennye Boroughs, MD  atorvastatin (LIPITOR) 40 MG tablet Take 40 mg by mouth daily.  04/27/16   [provider]  Cyanocobalamin (VITAMIN B-12 PO) Take 1 tablet by mouth daily.    [provider]  DULoxetine (CYMBALTA) 20 MG capsule Take 20 mg by mouth 2 (two) times daily. 04/17/21   [provider]  ferrous sulfate 325 (65 FE) MG tablet Take 1 tablet (325 mg total) by mouth daily. 05/09/21   Jennye Boroughs, MD  gabapentin (NEURONTIN) 300 MG capsule TAKE 1 CAPSULE BY MOUTH EVERYDAY AT BEDTIME Patient taking differently: Take 300 mg by mouth at bedtime. 11/08/20   Nicholas Lose, MD  Multiple Vitamin (MULTIVITAMIN WITH MINERALS) TABS tablet Take 1 tablet by mouth daily.    [provider]  omeprazole (PRILOSEC) 40 MG capsule Take 1 capsule (40 mg total) by mouth daily as needed (acid reflux). 05/09/21   Jennye Boroughs, MD  traMADol (ULTRAM) 50 MG tablet Take 2 tablets (100 mg total) by mouth 4 (four) times daily. 11/16/18   Edmisten, Kristie L, PA  valsartan-hydrochlorothiazide (DIOVAN-HCT) 80-12.5 MG  tablet Take 1 tablet by mouth daily.    [provider]  Vitamin D, Ergocalciferol, (DRISDOL) 50000 units CAPS capsule Take 50,000 Units by mouth every 7 (seven) days. Sunday    [provider]    No Known Allergies  REVIEW OF SYSTEMS:  General: no fevers/chills/night sweats Eyes: no blurry vision, diplopia, or amaurosis ENT: no sore throat or hearing loss Resp: no cough, wheezing, or hemoptysis CV: no edema or palpitations GI: no abdominal pain, nausea, vomiting, diarrhea, or constipation GU: no dysuria, frequency, or hematuria Skin: no rash Neuro: no headache, numbness, tingling, or weakness of extremities Musculoskeletal: no joint pain or swelling Heme: no bleeding, DVT, or easy bruising Endo: no polydipsia or polyuria  BP (!) 140/58   Pulse 73   Ht _0  (1.499 m)   Wt 145 lb (65.8 kg)   SpO2 97%   BMI 29.29 kg/m   PHYSICAL EXAM: GEN:  AO x 3 in no acute distress, frail HEENT: normal Dentition: Has both upper and lower dentures Neck: JVP normal. +1 carotid upstrokes without bruits. No thyromegaly. Lungs: equal expansion, clear bilaterally CV: Apex is discrete and nondisplaced, RRR with 4/6 SEM Abd: soft, non-tender, non-distended; no bruit; positive bowel sounds Ext: no edema, ecchymoses, or cyanosis Vascular: 2+ femoral pulses, 2+ radial pulses       Skin: warm and dry without rash Neuro: CN II-XII grossly intact; motor and sensory grossly intact    DATA AND STUDIES:  EKG: Normal sinus rhythm with left ventricular hypertrophy and strain  2D ECHO: August 2023: Severely calcified aortic valve with moderate to severe aortic stenosis with mean gradient 31 mmHg and moderate mitral stenosis with severe MAC with a mean gradient of 9 mmHg with ejection fraction of 60 to 65%  CARDIAC CATH: November 2022 minimal obstructive coronary artery disease with right dominant system  STS RISK CALCULATOR: 6.7%  NHYA CLASS: 1    ASSESSMENT AND PLAN:    Aortic valve stenosis, etiology of cardiac valve disease unspecified  Nonrheumatic mitral valve stenosis  Stage 3a chronic kidney disease ([place])  The patient returns for routine follow-up.  She has become much more frail and is unfortunately suffered a number of falls that do  not seem to be associated with syncope from aortic stenosis.  She is now wheelchair bound.  I had a long talk with her and her daughter.  I do not think an aortic valve intervention such as a TAVR is in her best interest and would likely be futile.  She and her daughter agree with this opinion.  I will see the patient back in 6 months however we will defer TAVR evaluation unless she really improves.  The fact that she suffered a pelvic fracture and numerous other fractures would seem to signify that she is quite frail.   Early Osmond, MD  07/08/2022 2:06 PM    [facility] [place], [place], [place]  [address] Phone: [phone]; Fax: [phone]

## 2022-07-08 ENCOUNTER — Ambulatory Visit: Payer: Medicare HMO | Attending: Internal Medicine | Admitting: Internal Medicine

## 2022-07-08 ENCOUNTER — Encounter: Payer: Self-pay | Admitting: Internal Medicine

## 2022-07-08 VITALS — BP 140/58 | HR 73 | Ht 59.0 in | Wt 145.0 lb

## 2022-07-08 DIAGNOSIS — I350 Nonrheumatic aortic (valve) stenosis: Secondary | ICD-10-CM | POA: Diagnosis not present

## 2022-07-08 DIAGNOSIS — E782 Mixed hyperlipidemia: Secondary | ICD-10-CM | POA: Diagnosis not present

## 2022-07-08 DIAGNOSIS — R52 Pain, unspecified: Secondary | ICD-10-CM | POA: Diagnosis not present

## 2022-07-08 DIAGNOSIS — K219 Gastro-esophageal reflux disease without esophagitis: Secondary | ICD-10-CM | POA: Diagnosis not present

## 2022-07-08 DIAGNOSIS — R54 Age-related physical debility: Secondary | ICD-10-CM | POA: Diagnosis not present

## 2022-07-08 DIAGNOSIS — R600 Localized edema: Secondary | ICD-10-CM | POA: Diagnosis not present

## 2022-07-08 DIAGNOSIS — G2581 Restless legs syndrome: Secondary | ICD-10-CM | POA: Diagnosis not present

## 2022-07-08 DIAGNOSIS — M8000XD Age-related osteoporosis with current pathological fracture, unspecified site, subsequent encounter for fracture with routine healing: Secondary | ICD-10-CM | POA: Diagnosis not present

## 2022-07-08 DIAGNOSIS — R739 Hyperglycemia, unspecified: Secondary | ICD-10-CM | POA: Diagnosis not present

## 2022-07-08 NOTE — Patient Instructions (Signed)
Medication Instructions:  No changes *If you need a refill on your cardiac medications before your next appointment, please call your pharmacy*   Lab Work: none  Testing/Procedures: none   Follow-Up: At [facility], you and your health needs are our priority.  As part of our continuing mission to provide you with exceptional heart care, we have created designated Provider Care Teams.  These Care Teams include your primary Cardiologist (physician) and Advanced Practice Providers (APPs -  Physician Assistants and Nurse Practitioners) who all work together to provide you with the care you need, when you need it.  Your next appointment:   6 month(s)  The format for your next appointment:   In Person  Provider:   Early Osmond, MD      Important Information About Sugar

## 2022-07-10 DIAGNOSIS — S12120A Other displaced dens fracture, initial encounter for closed fracture: Secondary | ICD-10-CM | POA: Diagnosis not present

## 2022-07-10 DIAGNOSIS — S12131A Unspecified traumatic nondisplaced spondylolisthesis of second cervical vertebra, initial encounter for closed fracture: Secondary | ICD-10-CM | POA: Diagnosis not present

## 2022-07-10 DIAGNOSIS — S12101A Unspecified nondisplaced fracture of second cervical vertebra, initial encounter for closed fracture: Secondary | ICD-10-CM | POA: Diagnosis not present

## 2022-07-21 DIAGNOSIS — F5105 Insomnia due to other mental disorder: Secondary | ICD-10-CM | POA: Diagnosis not present

## 2022-07-21 DIAGNOSIS — F4323 Adjustment disorder with mixed anxiety and depressed mood: Secondary | ICD-10-CM | POA: Diagnosis not present

## 2022-07-23 ENCOUNTER — Ambulatory Visit (INDEPENDENT_AMBULATORY_CARE_PROVIDER_SITE_OTHER): Payer: Medicare HMO | Admitting: Gastroenterology

## 2022-07-23 ENCOUNTER — Encounter: Payer: Self-pay | Admitting: Gastroenterology

## 2022-07-23 VITALS — Ht 60.0 in | Wt 143.0 lb

## 2022-07-23 DIAGNOSIS — Z9889 Other specified postprocedural states: Secondary | ICD-10-CM

## 2022-07-23 DIAGNOSIS — D509 Iron deficiency anemia, unspecified: Secondary | ICD-10-CM

## 2022-07-23 DIAGNOSIS — K8050 Calculus of bile duct without cholangitis or cholecystitis without obstruction: Secondary | ICD-10-CM | POA: Diagnosis not present

## 2022-07-23 NOTE — Patient Instructions (Addendum)
_______________________________________________________  If your blood pressure at your visit was 140/90 or greater, please contact your primary care physician to follow up on this.  _______________________________________________________  If you are age 87 or older, your body mass index should be between 23-30. Your Body mass index is 27.93 kg/m. If this is out of the aforementioned range listed, please consider follow up with your Primary Care Provider.  If you are age 46 or younger, your body mass index should be between 19-25. Your Body mass index is 27.93 kg/m. If this is out of the aformentioned range listed, please consider follow up with your Primary Care Provider.   ________________________________________________________  The [facility] providers would like to encourage you to use [facility] to communicate with providers for non-urgent requests or questions.  Due to long hold times on the telephone, sending your provider a message by [facility] may be a faster and more efficient way to get a response.  Please allow 48 business hours for a response.  Please remember that this is for non-urgent requests.  _______________________________________________________  Complete CBC and CMP at the facility and fax results to [phone] Attn Dr Lyndel Safe  Can do imodium AD 2 times a day as needed  Please call with any questions or concerns.  Thank you,  Dr. Jackquline Denmark   Gallbladder Eating Plan High blood cholesterol, obesity, a sedentary lifestyle, an unhealthy diet, and diabetes are risk factors for developing gallstones. If you have a gallbladder condition, you may have trouble digesting fats and tolerating high fat intake. Eating a low-fat diet can help reduce your symptoms and may be helpful before and after having surgery to remove your gallbladder (cholecystectomy). Your health care provider may recommend that you work with a dietitian to help you reduce the amount of fat in your  diet. What are tips for following this plan? General guidelines Limit your fat intake to less than 30% of your total daily calories. If you eat around 1,800 calories each day, this means eating less than 60 grams (g) of fat per day. Fat is an important part of a healthy diet. Eating a low-fat diet can make it hard to maintain a healthy body weight. Ask your dietitian how much fat, calories, and other nutrients you need each day. Eat small, frequent meals throughout the day instead of three large meals. Drink at least 8-10 cups (1.9-2.4 L) of fluid a day. Drink enough fluid to keep your urine pale yellow. If you drink alcohol: Limit how much you have to: 0-1 drink a day for women who are not pregnant. 0-2 drinks a day for men. Know how much alcohol is in a drink. In the [place] one drink equals one 12 oz bottle of beer (355 mL), one 5 oz glass of wine (148 mL), or one 1 oz glass of hard liquor (44 mL). Reading food labels  Check nutrition facts on food labels for the amount of fat per serving. Choose foods with less than 3 grams of fat per serving. Shopping Choose nonfat and low-fat healthy foods. Look for the words "nonfat," "low-fat," or "fat-free." Avoid buying processed or prepackaged foods. Cooking Cook using low-fat methods, such as baking, broiling, grilling, or boiling. Cook with small amounts of healthy fats, such as olive oil, grapeseed oil, canola oil, avocado oil, or sunflower oil. What foods are recommended?  All fresh, frozen, or canned fruits and vegetables. Whole grains. Low-fat or nonfat (skim) milk and yogurt. Lean meat, skinless poultry, fish, eggs, and beans. Low-fat protein  supplement powders or drinks. Spices and herbs. The items listed above may not be a complete list of foods and beverages you can eat and drink. Contact a dietitian for more information. What foods are not recommended? High-fat foods. These include baked goods, fast food, fatty cuts of meat, ice  cream, french toast, sweet rolls, pizza, cheese bread, foods covered with butter, creamy sauces, or cheese. Fried foods. These include french fries, tempura, battered fish, breaded chicken, fried breads, and sweets. Foods that cause bloating and gas. The items listed above may not be a complete list of foods that you should avoid. Contact a dietitian for more information. Summary A low-fat diet can be helpful if you have a gallbladder condition, or before and after gallbladder surgery. Limit your fat intake to less than 30% of your total daily calories. This is about 60 g of fat if you eat 1,800 calories each day. Eat small, frequent meals throughout the day instead of three large meals. This information is not intended to replace advice given to you by your health care provider. Make sure you discuss any questions you have with your health care provider. Document Revised: 06/06/2021 Document Reviewed: 06/06/2021 Elsevier Patient Education  [place].

## 2022-07-23 NOTE — Progress Notes (Signed)
Chief Complaint: FU  Referring Provider:  Kathyrn Lass, MD      ASSESSMENT AND PLAN;   #1. Choledocholithiasis s/p ERCP with ES/sphincteroplasty/spy as below.  Still has intact gallbladder.  Not a candidate for cholecystectomy per surgery.  Currently asymptomatic.  Hence, hold off on cholecystostomy.  #2. Recurrent IDA-transfusion dependent.  Being followed by hematology.   Plan: -CBC, CMP at facility.  Orders written. -Continue to transfuse PRN -Hold off on lap chole/cholecystostomy d/t multiple co-morbid conditions and since patient is asymptomatic currently. Daughter agrees.  Certainly if patient starts having any problems in future, can always reconsider options. -Trial of imodium AD 1 bid.   HPI:    Brandy [name] is a 89 y.o. female  [facility] pt Accompanied by her daughter [name]  With multiple medical problems including critical aortic stenosis (not a candidate for repair), recent fall resulting in multiple pelvic fractures 03/2022, HTN, breast CA, CKD 3, IDA  Here in GI clinic to determine if she needs cholecystectomy.  Not a surgical candidate per surgery.  No cholecystitis on recent noncontrast CT 03/26/2022.  Some air was noted in the gallbladder due to ERCPs.  She denies having any nausea, vomiting, heartburn, regurgitation, odynophagia or dysphagia.  No significant diarrhea or constipation.  No melena or hematochezia. No unintentional weight loss. No abdominal pain.  Has diarrhea 2-3 times per day without nocturnal symptoms especially after eating.  Has never tried Imodium.  Doesnot want colon or cholecystectomy- I agree  Recent GI history: -Previous H/O choledocholithiasis with ampullary stricture s/p failed ERCP 2015, with subsequent rendezvous procedure with IR. Adm 02/07/2022 with asc cholangitis w/t acute cholecystitis.  Responded well to IV Zosyn.  Underwent ERCP with biliary sphincterotomy/sphincteroplasty/stone extraction with plastic biliary stent 02/07/2022.   Repeat ERCP with spy (Dr Rush Landmark) 03/19/2022, further sphincteroplasty, stone extraction and removal of plastic stent.  Gallbladder did not fill.  Not a surgical candidate per surgery due to advanced age, critical aortic stenosis, frail clinical status.  -Recurrent IDA requiring blood transfusions.  EGD at the time of ERCP showed small hiatal hernia, J-shaped stomach.  She is maintained on PPIs.  Refuses colonoscopy.  -CT Abdo/pelvis without contrast 03/26/2022 1. Right superior and inferior pubic rami fractures, mildly comminuted and displaced. 2. Nondisplaced right sacral fracture without definite involvement of the sacral foramen. 3. Interval removal of biliary stent. Increased pneumobilia likely related to recent ERCP. Grossly stable biliary prominence on this unenhanced exam. 4. Additional stable chronic findings as described.  Past Medical History:  Diagnosis Date   Anemia    Arthritis    shoulders   Asthma    mild, no inhalers used   Breast cancer ([place]) 12/18/2011   left breast masectomy=metastatic ca in (1/1) lymph node ,invasive ductal ca,2 foci,,dcis,lymph ovascular invasion identified,surgical resection margins neg for ca,additional tissue=benign skin and subcutaneous tissue   Bronchitis    hx of;last time >78yrago   Depression    takes Celexa daily   Full dentures    Gall stones 2015   GERD (gastroesophageal reflux disease)    takes Prilosec prn   H/O hiatal hernia    History of kidney stones    many yrs ago   Hx of radiation therapy 04/26/12 -06/10/12   left breast   Hyperlipidemia    takes Simvastatin daily   Hypertension    takes Amlodipine and Diovan daily   Insomnia    takes Ambien nightly   Urinary urgency     Past Surgical  History:  Procedure Laterality Date   APPENDECTOMY     BILIARY DILATION  02/07/2022   Procedure: BILIARY DILATION;  Surgeon: Jackquline Denmark, MD;  Location: [place];  Service: Gastroenterology;;   BILIARY DILATION  03/19/2022    Procedure: BILIARY DILATION;  Surgeon: Irving Copas., MD;  Location: [facility];  Service: Gastroenterology;;   BILIARY STENT PLACEMENT  02/07/2022   Procedure: BILIARY STENT PLACEMENT;  Surgeon: Jackquline Denmark, MD;  Location: [place];  Service: Gastroenterology;;   BIOPSY  03/19/2022   Procedure: BIOPSY;  Surgeon: Irving Copas., MD;  Location: [facility];  Service: Gastroenterology;;   bladder tack     BREAST BIOPSY  1998   left    DILATION AND CURETTAGE OF UTERUS     ENDOSCOPIC RETROGRADE CHOLANGIOPANCREATOGRAPHY (ERCP) WITH PROPOFOL N/A 02/01/2014   Procedure: ENDOSCOPIC RETROGRADE CHOLANGIOPANCREATOGRAPHY (ERCP) WITH PROPOFOL;  Surgeon: [name], MD;  [facility]: [facility];  Service: Endoscopy;  Laterality: N/A;   ENDOSCOPIC RETROGRADE CHOLANGIOPANCREATOGRAPHY (ERCP) WITH PROPOFOL N/A 03/19/2022   Procedure: ENDOSCOPIC RETROGRADE CHOLANGIOPANCREATOGRAPHY (ERCP) WITH PROPOFOL;  Surgeon: Rush Landmark Telford Nab., MD;  Location: [facility];  Service: Gastroenterology;  Laterality: N/A;   ERCP N/A 02/07/2022   Procedure: ENDOSCOPIC RETROGRADE CHOLANGIOPANCREATOGRAPHY (ERCP);  Surgeon: Jackquline Denmark, MD;  Location[facility];  Service: Gastroenterology;  Laterality: N/A;   ESOPHAGOGASTRODUODENOSCOPY     ESOPHAGOGASTRODUODENOSCOPY N/A 06/26/2016   Procedure: ESOPHAGOGASTRODUODENOSCOPY (EGD);  Surgeon: [name], MD;  Location: [place];  Service: Endoscopy;  Laterality: N/A;   ESOPHAGOGASTRODUODENOSCOPY (EGD) WITH PROPOFOL  12/19/2013   Procedure: ESOPHAGOGASTRODUODENOSCOPY (EGD) WITH PROPOFOL;  Surgeon: Beryle Beams, MD;  Location: [place];  Service: Endoscopy;;   ESOPHAGOGASTRODUODENOSCOPY (EGD) WITH PROPOFOL N/A 09/10/2016   Procedure: ESOPHAGOGASTRODUODENOSCOPY (EGD) WITH PROPOFOL;  Surgeon: [name], MD;  [facility];  Service: Endoscopy;  Laterality: N/A;   EUS  06/04/2011   Procedure: UPPER ENDOSCOPIC ULTRASOUND (EUS)  LINEAR;  Surgeon: Owens Loffler, MD;  Location: [facility];  Service: Endoscopy;  Laterality: N/A;   EUS N/A 02/01/2014   Procedure: UPPER ENDOSCOPIC ULTRASOUND (EUS) LINEAR;  Surgeon: [name], MD;  [facility];  Service: Endoscopy;  Laterality: N/A;   EXPLORATORY LAPAROTOMY      biopsy of intra-abdominal mass   INTRAMEDULLARY (IM) NAIL INTERTROCHANTERIC Right 10/24/2018   Procedure: INTRAMEDULLARY (IM) NAIL INTERTROCHANTRIC;  Surgeon: Gaynelle Arabian, MD;  Location: [facility];  Service: Orthopedics;  Laterality: Right;   LAPAROTOMY N/A 11/17/2016   Procedure: EXPLORATORY LAPAROTOMY WITH LYSIS OF ADHESIONS, SMALL BOWEL RESECTION, REPAIR OF INCARCERATED VENTRAL INCISIONAL HERNIA, INSERTION OF BIOLOGIC MESH PATCH,APPLICATION OF WOUND VAC DRESSING;  Surgeon: Armandina Gemma, MD;  Location: [facility];  Service: General;  Laterality: N/A;   MASTECTOMY W/ SENTINEL NODE BIOPSY  12/18/2011   Procedure: MASTECTOMY WITH SENTINEL LYMPH NODE BIOPSY;  Surgeon: Rolm Bookbinder, MD;  Location: [place];  Service: General;  Laterality: Left;   PORT-A-CATH REMOVAL Right 06/15/2013   Procedure: REMOVAL PORT-A-CATH;  Surgeon: Rolm Bookbinder, MD;  Location: [place];  Service: General;  Laterality: Right;   PORTACATH PLACEMENT  01/27/2012   Procedure: INSERTION PORT-A-CATH;  Surgeon: Rolm Bookbinder, MD;  Location: [place];  Service: General;  Laterality: Right;  PORT PLACEMENT   REMOVAL OF STONES  02/07/2022   Procedure: REMOVAL OF STONES;  Surgeon: Jackquline Denmark, MD;  Location[facility];  Service: Gastroenterology;;   REMOVAL OF STONES  03/19/2022   Procedure: REMOVAL OF STONES;  Surgeon: Irving Copas.,  MD;  [facility]: [facility];  Service: Gastroenterology;;   RIGHT/LEFT HEART CATH AND CORONARY ANGIOGRAPHY N/A 05/08/2021   Procedure: RIGHT/LEFT HEART CATH AND CORONARY ANGIOGRAPHY;  Surgeon: Jettie Booze, MD;  Location: [place] [facility];  Service:  Cardiovascular;  Laterality: N/A;   SPHINCTEROTOMY  02/07/2022   Procedure: SPHINCTEROTOMY;  Surgeon: Jackquline Denmark, MD;  Location: [facility];  Service: Gastroenterology;;   Bess Kinds CHOLANGIOSCOPY N/A 03/19/2022   Procedure: VPXTGGYI CHOLANGIOSCOPY;  Surgeon: Irving Copas., MD;  Location[facility];  Service: Gastroenterology;  Laterality: N/A;   STENT REMOVAL  03/19/2022   Procedure: STENT REMOVAL;  Surgeon: Irving Copas., MD;  Location: [facility];  Service: Gastroenterology;;   TOTAL HIP ARTHROPLASTY Right 11/14/2018   Procedure: REMOVAL OF INTRAMEDULLARY NAIL AND POSTERIOR TOTAL HIP ARTHROPLASTY;  Surgeon: Gaynelle Arabian, MD;  Location: [facility];  Service: Orthopedics;  Laterality: Right;   TOTAL SHOULDER REPLACEMENT  2011   left   TUBAL LIGATION      Family History  Problem Relation Age of Onset   Prostate cancer Father    Breast cancer Other     Social History   Tobacco Use   Smoking status: Never   Smokeless tobacco: Never  Vaping Use   Vaping Use: Never used  Substance Use Topics   Alcohol use: No   Drug use: No    Current Outpatient Medications  Medication Sig Dispense Refill   acetaminophen (TYLENOL) 500 MG tablet Take 1,000 mg by mouth every 6 (six) hours as needed for headache (pain).     alendronate (FOSAMAX) 70 MG tablet Take 70 mg by mouth every Sunday.     atorvastatin (LIPITOR) 40 MG tablet Take 40 mg by mouth at bedtime.     Cyanocobalamin (VITAMIN B-12 PO) Take 1 tablet by mouth every evening.     DULoxetine (CYMBALTA) 60 MG capsule Take 60 mg by mouth daily.     ferrous sulfate 325 (65 FE) MG tablet Take 1 tablet (325 mg total) by mouth daily.     gabapentin (NEURONTIN) 300 MG capsule Take 2 capsules (600 mg total) by mouth at bedtime. 90 capsule 3   Multiple Vitamin (MULTIVITAMIN WITH MINERALS) TABS tablet Take 1 tablet by mouth in the morning.     omeprazole (PRILOSEC) 40 MG capsule Take 1 capsule (40 mg total) by mouth daily  before breakfast. 30 capsule 12   rOPINIRole (REQUIP) 0.25 MG tablet Take 0.25 mg by mouth at bedtime.     traMADol (ULTRAM) 50 MG tablet Take 1 tablet (50 mg total) by mouth 4 (four) times daily as needed for moderate pain (pain.). 5 tablet 0   valsartan-hydrochlorothiazide (DIOVAN-HCT) 80-12.5 MG tablet Take 1 tablet by mouth in the morning.     Vitamin D, Ergocalciferol, (DRISDOL) 50000 units CAPS capsule Take 50,000 Units by mouth every Sunday.     No current facility-administered medications for this visit.   Facility-Administered Medications Ordered in Other Visits  Medication Dose Route Frequency Provider Last Rate Last Admin   sodium chloride 0.9 % injection 10 mL  10 mL Intracatheter PRN Marcy Panning, MD   10 mL at 03/14/12 1628    No Known Allergies  Review of Systems:  neg     Physical Exam:    Ht 5' (1.524 m)   Wt 143 lb (64.9 kg) Comment: verbalized  BMI 27.93 kg/m  Wt Readings from Last 3 Encounters:  07/23/22 143 lb (64.9 kg)  07/08/22 145 lb (65.8 kg)  05/11/22  148 lb 14.4 oz (67.5 kg)   Constitutional: Not in any distress.  Wheelchair-bound. Psychiatric: Normal mood and affect. Behavior is normal. HEENT: Mild pallor Cardiovascular: Normal rate, regular rhythm. No edema Pulmonary/chest: Effort normal and breath sounds normal. No wheezing, rales or rhonchi. Abdominal: Soft, nondistended. Nontender. Bowel sounds active throughout. There are no masses palpable. No hepatomegaly. Rectal: Deferred Neurological: Alert and oriented to person place and time. Skin: Skin is warm and dry. No rashes noted.  Data Reviewed: I have personally reviewed following labs and imaging studies  CBC:    Latest Ref Rng & Units 05/11/2022    9:21 AM 03/28/2022    3:23 AM 03/27/2022    3:49 AM  CBC  WBC 4.0 - 10.5 K/uL 6.9  7.7  6.8   Hemoglobin 12.0 - 15.0 g/dL 7.9  8.4  8.1   Hematocrit 36.0 - 46.0 % 25.3  27.2  26.1   Platelets 150 - 400 K/uL 213  157  149     CMP:     Latest Ref Rng & Units 03/28/2022    3:23 AM 03/27/2022    3:49 AM 03/26/2022    9:47 PM  CMP  Glucose 70 - 99 mg/dL 120  114  111   BUN 8 - 23 mg/dL '26  26  28   '$ Creatinine 0.44 - 1.00 mg/dL 1.06  0.98  1.08   Sodium 135 - 145 mmol/L 143  140  141   Potassium 3.5 - 5.1 mmol/L 4.3  4.0  4.2   Chloride 98 - 111 mmol/L 114  115  115   CO2 22 - 32 mmol/L '24  21  21   '$ Calcium 8.9 - 10.3 mg/dL 8.8  8.8  9.1   Total Protein 6.5 - 8.1 g/dL  6.1    Total Bilirubin 0.3 - 1.2 mg/dL  0.6    Alkaline Phos 38 - 126 U/L  67    AST 15 - 41 U/L  21    ALT 0 - 44 U/L  22      GFR: CrCl cannot be calculated (Patient's most recent lab result is older than the maximum 21 days allowed.). Liver Function Tests: No results for input(s): "AST", "ALT", "ALKPHOS", "BILITOT", "PROT", "ALBUMIN" in the last 168 hours. No results for input(s): "LIPASE", "AMYLASE" in the last 168 hours. No results for input(s): "AMMONIA" in the last 168 hours. Coagulation Profile: No results for input(s): "INR", "PROTIME" in the last 168 hours. HbA1C: No results for input(s): "HGBA1C" in the last 72 hours. Lipid Profile: No results for input(s): "CHOL", "HDL", "LDLCALC", "TRIG", "CHOLHDL", "LDLDIRECT" in the last 72 hours. Thyroid Function Tests: No results for input(s): "TSH", "T4TOTAL", "FREET4", "T3FREE", "THYROIDAB" in the last 72 hours. Anemia Panel: No results for input(s): "VITAMINB12", "FOLATE", "FERRITIN", "TIBC", "IRON", "RETICCTPCT" in the last 72 hours.  No results found for this or any previous visit (from the past 240 hour(s)).    Radiology Studies: No results found.    Carmell Austria, MD 07/23/2022, 10:05 AM  Cc: Kathyrn Lass, MD

## 2022-07-28 DIAGNOSIS — M8000XD Age-related osteoporosis with current pathological fracture, unspecified site, subsequent encounter for fracture with routine healing: Secondary | ICD-10-CM | POA: Diagnosis not present

## 2022-07-28 DIAGNOSIS — E782 Mixed hyperlipidemia: Secondary | ICD-10-CM | POA: Diagnosis not present

## 2022-07-28 DIAGNOSIS — K219 Gastro-esophageal reflux disease without esophagitis: Secondary | ICD-10-CM | POA: Diagnosis not present

## 2022-07-28 DIAGNOSIS — F321 Major depressive disorder, single episode, moderate: Secondary | ICD-10-CM | POA: Diagnosis not present

## 2022-07-29 DIAGNOSIS — I739 Peripheral vascular disease, unspecified: Secondary | ICD-10-CM | POA: Diagnosis not present

## 2022-07-29 DIAGNOSIS — L603 Nail dystrophy: Secondary | ICD-10-CM | POA: Diagnosis not present

## 2022-07-30 DIAGNOSIS — M6281 Muscle weakness (generalized): Secondary | ICD-10-CM | POA: Diagnosis not present

## 2022-07-30 DIAGNOSIS — R262 Difficulty in walking, not elsewhere classified: Secondary | ICD-10-CM | POA: Diagnosis not present

## 2022-08-03 DIAGNOSIS — R262 Difficulty in walking, not elsewhere classified: Secondary | ICD-10-CM | POA: Diagnosis not present

## 2022-08-03 DIAGNOSIS — M6281 Muscle weakness (generalized): Secondary | ICD-10-CM | POA: Diagnosis not present

## 2022-08-04 DIAGNOSIS — Z79899 Other long term (current) drug therapy: Secondary | ICD-10-CM | POA: Diagnosis not present

## 2022-08-04 DIAGNOSIS — E559 Vitamin D deficiency, unspecified: Secondary | ICD-10-CM | POA: Diagnosis not present

## 2022-08-04 DIAGNOSIS — I10 Essential (primary) hypertension: Secondary | ICD-10-CM | POA: Diagnosis not present

## 2022-08-04 DIAGNOSIS — Z0189 Encounter for other specified special examinations: Secondary | ICD-10-CM | POA: Diagnosis not present

## 2022-08-05 DIAGNOSIS — R262 Difficulty in walking, not elsewhere classified: Secondary | ICD-10-CM | POA: Diagnosis not present

## 2022-08-05 DIAGNOSIS — F4323 Adjustment disorder with mixed anxiety and depressed mood: Secondary | ICD-10-CM | POA: Diagnosis not present

## 2022-08-05 DIAGNOSIS — F5105 Insomnia due to other mental disorder: Secondary | ICD-10-CM | POA: Diagnosis not present

## 2022-08-05 DIAGNOSIS — M6281 Muscle weakness (generalized): Secondary | ICD-10-CM | POA: Diagnosis not present

## 2022-08-10 DIAGNOSIS — R262 Difficulty in walking, not elsewhere classified: Secondary | ICD-10-CM | POA: Diagnosis not present

## 2022-08-10 DIAGNOSIS — M6281 Muscle weakness (generalized): Secondary | ICD-10-CM | POA: Diagnosis not present

## 2022-08-11 DIAGNOSIS — M6281 Muscle weakness (generalized): Secondary | ICD-10-CM | POA: Diagnosis not present

## 2022-08-11 DIAGNOSIS — R262 Difficulty in walking, not elsewhere classified: Secondary | ICD-10-CM | POA: Diagnosis not present

## 2022-08-12 DIAGNOSIS — M6281 Muscle weakness (generalized): Secondary | ICD-10-CM | POA: Diagnosis not present

## 2022-08-12 DIAGNOSIS — R262 Difficulty in walking, not elsewhere classified: Secondary | ICD-10-CM | POA: Diagnosis not present

## 2022-08-13 DIAGNOSIS — M6281 Muscle weakness (generalized): Secondary | ICD-10-CM | POA: Diagnosis not present

## 2022-08-13 DIAGNOSIS — R262 Difficulty in walking, not elsewhere classified: Secondary | ICD-10-CM | POA: Diagnosis not present

## 2022-08-14 DIAGNOSIS — R262 Difficulty in walking, not elsewhere classified: Secondary | ICD-10-CM | POA: Diagnosis not present

## 2022-08-14 DIAGNOSIS — M6281 Muscle weakness (generalized): Secondary | ICD-10-CM | POA: Diagnosis not present

## 2022-08-18 DIAGNOSIS — F321 Major depressive disorder, single episode, moderate: Secondary | ICD-10-CM | POA: Diagnosis not present

## 2022-08-18 DIAGNOSIS — F5105 Insomnia due to other mental disorder: Secondary | ICD-10-CM | POA: Diagnosis not present

## 2022-08-18 DIAGNOSIS — F4323 Adjustment disorder with mixed anxiety and depressed mood: Secondary | ICD-10-CM | POA: Diagnosis not present

## 2022-08-19 DIAGNOSIS — M6281 Muscle weakness (generalized): Secondary | ICD-10-CM | POA: Diagnosis not present

## 2022-08-19 DIAGNOSIS — R262 Difficulty in walking, not elsewhere classified: Secondary | ICD-10-CM | POA: Diagnosis not present

## 2022-08-25 DIAGNOSIS — R262 Difficulty in walking, not elsewhere classified: Secondary | ICD-10-CM | POA: Diagnosis not present

## 2022-08-25 DIAGNOSIS — M6281 Muscle weakness (generalized): Secondary | ICD-10-CM | POA: Diagnosis not present

## 2022-08-25 DIAGNOSIS — F321 Major depressive disorder, single episode, moderate: Secondary | ICD-10-CM | POA: Diagnosis not present

## 2022-10-28 ENCOUNTER — Encounter: Payer: Self-pay | Admitting: Internal Medicine

## 2022-10-28 ENCOUNTER — Ambulatory Visit: Payer: Medicare (Managed Care) | Attending: Internal Medicine | Admitting: Internal Medicine

## 2022-10-28 VITALS — BP 146/68 | HR 86 | Ht 60.0 in | Wt 149.8 lb

## 2022-10-28 DIAGNOSIS — N1831 Chronic kidney disease, stage 3a: Secondary | ICD-10-CM | POA: Diagnosis not present

## 2022-10-28 DIAGNOSIS — R54 Age-related physical debility: Secondary | ICD-10-CM

## 2022-10-28 DIAGNOSIS — I350 Nonrheumatic aortic (valve) stenosis: Secondary | ICD-10-CM | POA: Diagnosis not present

## 2022-10-28 DIAGNOSIS — I342 Nonrheumatic mitral (valve) stenosis: Secondary | ICD-10-CM | POA: Diagnosis not present

## 2022-10-28 NOTE — Progress Notes (Signed)
Pre Surgical Assessment: 5 M Walk Test  65M=16.75ft  5 Meter Walk Test- trial 1:  unable to complete became short of breath 5 Meter Walk Test- trial 2: unable to complete 5 Meter Walk Test- trial 3: unable to complete 5 Meter Walk Test Average: unable to complete

## 2022-10-28 NOTE — Patient Instructions (Signed)
Medication Instructions:  Your physician recommends that you continue on your current medications as directed. Please refer to the Current Medication list given to you today.  *If you need a refill on your cardiac medications before your next appointment, please call your pharmacy*   Lab Work: NONE If you have labs (blood work) drawn today and your tests are completely normal, you will receive your results only by: MyChart Message (if you have MyChart) OR A paper copy in the mail If you have any lab test that is abnormal or we need to change your treatment, we will call you to review the results.   Testing/Procedures: NONE   Follow-Up:With Dr. Leafy Ro as scheduled At [facility], you and your health needs are our priority.  As part of our continuing mission to provide you with exceptional heart care, we have created designated Provider Care Teams.  These Care Teams include your primary Cardiologist (physician) and Advanced Practice Providers (APPs -  Physician Assistants and Nurse Practitioners) who all work together to provide you with the care you need, when you need it.

## 2022-10-28 NOTE — Progress Notes (Signed)
Brandy [name] MRN: [id number] DOB/AGE: 87-Oct-1934 87 y.o.  Primary Care Physician:Miller, Misty Stanley, MD Primary Cardiologist: Nathaniel Man, MD   PROBLEM LIST: 1.  Moderate to severe aortic stenosis with aortic valve area of 0.76 cm and mean gradient 31 mmHg with a normal ejection fraction; no conduction abnormalities 2.  Hypertension 3.  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  Frailty and ambulates with walker 5.  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  CKD stage III 7.  Iron deficiency anemia   HISTORY OF PRESENT ILLNESS:  November 2023: The patient returns for follow-up.  I saw her last in the end of August.  At that point in time she was more dyspneic and was more amenable to pursuing potential aortic valve intervention.  She was referred for CT scan.  Unfortunately she was admitted about a month ago with pubic ramus fracture.  Apparently she tripped when she was ambulating with her walker.  She fell backwards hitting her right hip and buttocks but did not hit her head.  She was evaluated by orthopedic surgery and no acute surgical intervention was required.  Unfortunately because she was ambulating awkwardly she fractured her right hip as well and this was realized last week.  Because of this she is on strict bedrest to allow all of her fractures to heal before she rehabilitates.  In regards to cardiovascular symptoms she denies any shortness of breath again because she is not ambulating very much.  She denies any exertional angina.  She has had no severe peripheral edema, paroxysmal nocturnal dyspnea, orthopnea.  She has been relatively stable from a cardiovascular standpoint.  Plan: Defer aortic valve intervention for now due to allow recuperation from orthopedic issues; follow-up in 2 months.  January 2024: Patient returns for routine follow-up.  She unfortunately has suffered a few more  falls not due to dizziness.  She also developed a neck fracture in early December.  She did not need hospitalization for this and is in a neck brace.  She is here with her daughter.  She is at a rehab facility.  She does not do much in a day.  She is unsteady on her feet and in wheelchair most of the day.  She denies any shortness of breath or chest discomfort.  Plan:  Monitor, allow for recuperation.  Today: The patient is seen for expedited follow-up after presenting to [facility] 4 days ago with chest pain.  The patient developed epigastric discomfort after eating.  She felt as if it was like heartburn and after burping it subsided.  She was administered aspirin and nitroglycerin and her pain resolved completely after this.  Her high-sensitivity troponins are flat.  Her EKG demonstrated sinus rhythm with PACs and left bundle branch block.  She was discharged home.    The patient is here with her daughter today.  She is recuperated from her various fractures.  She is now at an assisted living facility.  She is mostly confined to a wheelchair however she can walk on occasion.  She notices that she has had increasing shortness of breath when going from her wheelchair to the bathroom.  This is occurred over the last month or 2.  She fortunately denies any presyncope or syncope.  She denies any exertional angina.  She has had no bleeding or bruising episodes.  She has had no signs or symptoms  of stroke.  Past Medical History:  Diagnosis Date   Anemia    Arthritis    shoulders   Asthma    mild, no inhalers used   Breast cancer 12/18/2011   left breast masectomy=metastatic ca in (1/1) lymph node ,invasive ductal ca,2 foci,,dcis,lymph ovascular invasion identified,surgical resection margins neg for ca,additional tissue=benign skin and subcutaneous tissue   Bronchitis    hx of;last time >66yr ago   Depression    takes Celexa daily   Full dentures    Gall stones 2015   GERD  (gastroesophageal reflux disease)    takes Prilosec prn   H/O hiatal hernia    History of kidney stones    many yrs ago   Hx of radiation therapy 04/26/12 -06/10/12   left breast   Hyperlipidemia    takes Simvastatin daily   Hypertension    takes Amlodipine and Diovan daily   Insomnia    takes Ambien nightly   Urinary urgency     Past Surgical History:  Procedure Laterality Date   APPENDECTOMY     BILIARY DILATION  02/07/2022   Procedure: BILIARY DILATION;  Surgeon: Lynann Bologna, MD;  Location: [facility] ENDOSCOPY;  Service: Gastroenterology;;   BILIARY DILATION  03/19/2022   Procedure: BILIARY DILATION;  Surgeon: Lemar Lofty., MD;  Location: [facility] ENDOSCOPY;  Service: Gastroenterology;;   BILIARY STENT PLACEMENT  02/07/2022   Procedure: BILIARY STENT PLACEMENT;  Surgeon: Lynann Bologna, MD;  Location: [facility] ENDOSCOPY;  Service: Gastroenterology;;   BIOPSY  03/19/2022   Procedure: BIOPSY;  Surgeon: Lemar Lofty., MD;  Location: [facility] ENDOSCOPY;  Service: Gastroenterology;;   bladder tack     BREAST BIOPSY  1998   left    DILATION AND CURETTAGE OF UTERUS     ENDOSCOPIC RETROGRADE CHOLANGIOPANCREATOGRAPHY (ERCP) WITH PROPOFOL N/A 02/01/2014   Procedure: ENDOSCOPIC RETROGRADE CHOLANGIOPANCREATOGRAPHY (ERCP) WITH PROPOFOL;  Surgeon: Rachael Fee, MD;  Location: [facility] ENDOSCOPY;  Service: Endoscopy;  Laterality: N/A;   ENDOSCOPIC RETROGRADE CHOLANGIOPANCREATOGRAPHY (ERCP) WITH PROPOFOL N/A 03/19/2022   Procedure: ENDOSCOPIC RETROGRADE CHOLANGIOPANCREATOGRAPHY (ERCP) WITH PROPOFOL;  Surgeon: Meridee Score Netty Starring., MD;  Location: [facility] ENDOSCOPY;  Service: Gastroenterology;  Laterality: N/A;   ERCP N/A 02/07/2022   Procedure: ENDOSCOPIC RETROGRADE CHOLANGIOPANCREATOGRAPHY (ERCP);  Surgeon: Lynann Bologna, MD;  Location: [facility] ENDOSCOPY;  Service: Gastroenterology;  Laterality: N/A;   ESOPHAGOGASTRODUODENOSCOPY     ESOPHAGOGASTRODUODENOSCOPY N/A 06/26/2016   Procedure: ESOPHAGOGASTRODUODENOSCOPY  (EGD);  Surgeon: Rachael Fee, MD;  Location: [facility] ENDOSCOPY;  Service: Endoscopy;  Laterality: N/A;   ESOPHAGOGASTRODUODENOSCOPY (EGD) WITH PROPOFOL  12/19/2013   Procedure: ESOPHAGOGASTRODUODENOSCOPY (EGD) WITH PROPOFOL;  Surgeon: Theda Belfast, MD;  Location: [facility] ENDOSCOPY;  Service: Endoscopy;;   ESOPHAGOGASTRODUODENOSCOPY (EGD) WITH PROPOFOL N/A 09/10/2016   Procedure: ESOPHAGOGASTRODUODENOSCOPY (EGD) WITH PROPOFOL;  Surgeon: Rachael Fee, MD;  Location: [facility] ENDOSCOPY;  Service: Endoscopy;  Laterality: N/A;   EUS  06/04/2011   Procedure: UPPER ENDOSCOPIC ULTRASOUND (EUS) LINEAR;  Surgeon: Rob Bunting, MD;  Location: [facility] ENDOSCOPY;  Service: Endoscopy;  Laterality: N/A;   EUS N/A 02/01/2014   Procedure: UPPER ENDOSCOPIC ULTRASOUND (EUS) LINEAR;  Surgeon: Rachael Fee, MD;  Location: [facility] ENDOSCOPY;  Service: Endoscopy;  Laterality: N/A;   EXPLORATORY LAPAROTOMY      biopsy of intra-abdominal mass   INTRAMEDULLARY (IM) NAIL INTERTROCHANTERIC Right 10/24/2018   Procedure: INTRAMEDULLARY (IM) NAIL INTERTROCHANTRIC;  Surgeon: Ollen Gross, MD;  Location: [facility] ORS;  Service: Orthopedics;  Laterality: Right;   LAPAROTOMY N/A 11/17/2016   Procedure: EXPLORATORY  LAPAROTOMY WITH LYSIS OF ADHESIONS, SMALL BOWEL RESECTION, REPAIR OF INCARCERATED VENTRAL INCISIONAL HERNIA, INSERTION OF BIOLOGIC MESH PATCH,APPLICATION OF WOUND VAC DRESSING;  Surgeon: Darnell Level, MD;  Location: [facility] ORS;  Service: General;  Laterality: N/A;   MASTECTOMY W/ SENTINEL NODE BIOPSY  12/18/2011   Procedure: MASTECTOMY WITH SENTINEL LYMPH NODE BIOPSY;  Surgeon: Emelia Loron, MD;  Location: [facility] OR;  Service: General;  Laterality: Left;   PORT-A-CATH REMOVAL Right 06/15/2013   Procedure: REMOVAL PORT-A-CATH;  Surgeon: Emelia Loron, MD;  Location: [facility];  Service: General;  Laterality: Right;   PORTACATH PLACEMENT  01/27/2012   Procedure: INSERTION PORT-A-CATH;  Surgeon: Emelia Loron, MD;  Location:  [facility];  Service: General;  Laterality: Right;  PORT PLACEMENT   REMOVAL OF STONES  02/07/2022   Procedure: REMOVAL OF STONES;  Surgeon: Lynann Bologna, MD;  Location: [facility] ENDOSCOPY;  Service: Gastroenterology;;   REMOVAL OF STONES  03/19/2022   Procedure: REMOVAL OF STONES;  Surgeon: Lemar Lofty., MD;  Location: [facility] ENDOSCOPY;  Service: Gastroenterology;;   RIGHT/LEFT HEART CATH AND CORONARY ANGIOGRAPHY N/A 05/08/2021   Procedure: RIGHT/LEFT HEART CATH AND CORONARY ANGIOGRAPHY;  Surgeon: Corky Crafts, MD;  Location: [facility] INVASIVE CV LAB;  Service: Cardiovascular;  Laterality: N/A;   SPHINCTEROTOMY  02/07/2022   Procedure: SPHINCTEROTOMY;  Surgeon: Lynann Bologna, MD;  Location: [facility] ENDOSCOPY;  Service: Gastroenterology;;   Bur[place] CHOLANGIOSCOPY N/A 03/19/2022   Procedure: ZOXWRUEA CHOLANGIOSCOPY;  Surgeon: Lemar Lofty., MD;  Location: [facility] ENDOSCOPY;  Service: Gastroenterology;  Laterality: N/A;   STENT REMOVAL  03/19/2022   Procedure: STENT REMOVAL;  Surgeon: Lemar Lofty., MD;  Location: [facility] ENDOSCOPY;  Service: Gastroenterology;;   TOTAL HIP ARTHROPLASTY Right 11/14/2018   Procedure: REMOVAL OF INTRAMEDULLARY NAIL AND POSTERIOR TOTAL HIP ARTHROPLASTY;  Surgeon: Ollen Gross, MD;  Location: [facility] ORS;  Service: Orthopedics;  Laterality: Right;   TOTAL SHOULDER REPLACEMENT  2011   left   TUBAL LIGATION      Family History  Problem Relation Age of Onset   Prostate cancer Father    Breast cancer Other     Social History   Socioeconomic History   Marital status: Widowed    Spouse name: Not on file   Number of children: Not on file   Years of education: Not on file   Highest education level: Not on file  Occupational History   Occupation: retired  Tobacco Use   Smoking status: Never   Smokeless tobacco: Never  Vaping Use   Vaping Use: Never used  Substance and Sexual Activity   Alcohol use: No   Drug use: No   Sexual activity: Yes     Birth control/protection: Surgical    Comment: mensus age 60, 1st pregnancy 32, no hrt gg4,p3, 1 babay lived a few hours complications  Other Topics Concern   Not on file  Social History Narrative   Not on file   Social Determinants of Health   Financial Resource Strain: Not on file  Food Insecurity: No Food Insecurity (03/27/2022)   Hunger Vital Sign    Worried About Running Out of Food in the Last Year: Never true    Ran Out of Food in the Last Year: Never true  Transportation Needs: No Transportation Needs (03/27/2022)   PRAPARE - Administrator, Civil Service (Medical): No    Lack of Transportation (Non-Medical): No  Physical Activity: Unknown (10/29/2018)   Exercise Vital Sign    Days  of Exercise per Week: Patient declined    Minutes of Exercise per Session: Patient declined  Stress: Not on file  Social Connections: Unknown (10/29/2018)   Social Connection and Isolation Panel [NHANES]    Frequency of Communication with Friends and Family: Patient declined    Frequency of Social Gatherings with Friends and Family: Patient declined    Attends Religious Services: Patient declined    Database administrator or Organizations: Patient declined    Attends Banker Meetings: Patient declined    Marital Status: Patient declined  Intimate Partner Violence: Not At Risk (03/27/2022)   Humiliation, Afraid, Rape, and Kick questionnaire    Fear of Current or Ex-Partner: No    Emotionally Abused: No    Physically Abused: No    Sexually Abused: No     Prior to Admission medications   Medication Sig Start Date End Date Taking? Authorizing Provider  acetaminophen (TYLENOL) 500 MG tablet Take 1,000 mg by mouth every 6 (six) hours as needed (pain).    [provider]  alendronate (FOSAMAX) 70 MG tablet Take 70 mg by mouth once a week. sunday 04/01/21   [provider]  amoxicillin-clavulanate (AUGMENTIN) 875-125 MG tablet Take 1 tablet by mouth 2 (two)  times daily for 3 days. 05/09/21 05/12/21  Lurene Shadow, MD  atorvastatin (LIPITOR) 40 MG tablet Take 40 mg by mouth daily.  04/27/16   [provider]  Cyanocobalamin (VITAMIN B-12 PO) Take 1 tablet by mouth daily.    [provider]  DULoxetine (CYMBALTA) 20 MG capsule Take 20 mg by mouth 2 (two) times daily. 04/17/21   [provider]  ferrous sulfate 325 (65 FE) MG tablet Take 1 tablet (325 mg total) by mouth daily. 05/09/21   Lurene Shadow, MD  gabapentin (NEURONTIN) 300 MG capsule TAKE 1 CAPSULE BY MOUTH EVERYDAY AT BEDTIME Patient taking differently: Take 300 mg by mouth at bedtime. 11/08/20   Serena Croissant, MD  Multiple Vitamin (MULTIVITAMIN WITH MINERALS) TABS tablet Take 1 tablet by mouth daily.    [provider]  omeprazole (PRILOSEC) 40 MG capsule Take 1 capsule (40 mg total) by mouth daily as needed (acid reflux). 05/09/21   Lurene Shadow, MD  traMADol (ULTRAM) 50 MG tablet Take 2 tablets (100 mg total) by mouth 4 (four) times daily. 11/16/18   Edmisten, Kristie L, PA  valsartan-hydrochlorothiazide (DIOVAN-HCT) 80-12.5 MG tablet Take 1 tablet by mouth daily.    [provider]  Vitamin D, Ergocalciferol, (DRISDOL) 50000 units CAPS capsule Take 50,000 Units by mouth every 7 (seven) days. Sunday    [provider]    No Known Allergies  REVIEW OF SYSTEMS:  General: no fevers/chills/night sweats Eyes: no blurry vision, diplopia, or amaurosis ENT: no sore throat or hearing loss Resp: no cough, wheezing, or hemoptysis CV: no edema or palpitations GI: no abdominal pain, nausea, vomiting, diarrhea, or constipation GU: no dysuria, frequency, or hematuria Skin: no rash Neuro: no headache, numbness, tingling, or weakness of extremities Musculoskeletal: no joint pain or swelling Heme: no bleeding, DVT, or easy bruising Endo: no polydipsia or polyuria  BP (!) 146/68   Pulse 86   Ht 5' (1.524 m)   Wt 149 lb 12.8 oz (67.9 kg)   SpO2  99%   BMI 29.26 kg/m   PHYSICAL EXAM: GEN:  AO x 3 in no acute distress, frail HEENT: normal Dentition: Has both upper and lower dentures Neck: JVP normal. +1 carotid upstrokes without bruits. No  thyromegaly. Lungs: equal expansion, clear bilaterally CV: Apex is discrete and nondisplaced, RRR with 4/6 SEM Abd: soft, non-tender, non-distended; no bruit; positive bowel sounds Ext: no edema, ecchymoses, or cyanosis Vascular: 2+ femoral pulses, 2+ radial pulses       Skin: warm and dry without rash Neuro: CN II-XII grossly intact; motor and sensory grossly intact    DATA AND STUDIES:  EKG: Normal sinus rhythm with left ventricular hypertrophy and strain  2D ECHO: August 2023: Severely calcified aortic valve with moderate to severe aortic stenosis with mean gradient 31 mmHg and moderate mitral stenosis with severe MAC with a mean gradient of 9 mmHg with ejection fraction of 60 to 65%  TAVR CTA: 1. Tricuspid aortic valve with severely reduced cusp excursion. Moderately thickened and severely calcified aortic valve cusps. 2.  Aortic valve calcium score: 2283 3. Annulus area: 439 mm2, suitable for 26 mm Sapien 3 valve. Mild LVOT calcifications. 4.  Sufficient coronary artery heights from annulus. 5.  Optimum Fluoroscopic Angle for Delivery: LAO 9 CAU 9 6.  Severe mitral annular calcification.  CARDIAC CATH: November 2022 minimal obstructive coronary artery disease with right dominant system  STS RISK CALCULATOR: 6.7%  NHYA CLASS: 2  CCS 1    ASSESSMENT AND PLAN:   Aortic valve stenosis, etiology of cardiac valve disease unspecified  Nonrheumatic mitral valve stenosis  Stage 3a chronic kidney disease (HCC)  I am somewhat surprised that the patient has recuperated from her various falls and fractures.  She is now at assisted living.  She still is in a wheelchair most hours of the day.  When she transfers from her wheelchair to to her better commode she does develop  shortness of breath which is getting worse.  I had a long conversation with the patient and her daughter about this.  I told her that I imagine that her breathing issues will get worse over time if nothing is done.  Certainly a few months ago I did not think she was a candidate for aortic valve intervention however now that she is in assisted living and doing well albeit with relatively low functional capacity I think it makes sense to consider addressing her aortic stenosis in order to prevent further deterioration of her quality of life or hospitalization for heart failure.  I did tell her that I am concerned about the state of her mitral valve.  She has moderate mitral stenosis which after an aortic valve intervention could get worse over time.  The patient has decided to pursue aortic valve intervention to help prevent worsening quality of life.  She has had a TAVR protocol CTA and a relatively recent coronary angiography study.  She is developed no exertional angina so I do not think we need to repeat this study.  I will refer the patient for cardiothoracic surgical opinion.  Given her advanced age, frailty, and other comorbidities I imagine a TAVR will be in her best interest.  Orbie Pyo, MD  10/28/2022 3:28 PM    [facility] [address] [place], [place], [place]  [address] Phone: [phone]; Fax: [phone]

## 2022-11-01 NOTE — Progress Notes (Unsigned)
[address]       [place] [address]             [phone]           SOPHI CALLIGAN [facility] Medical Record #[id number] Date of Birth: 05-04-1933  Orbie Pyo, MD Sigmund Hazel, MD  Chief Complaint: SOB and CP    History of Present Illness:     Pt is a very frail and elderly 89 yo who is confined to wheelchair and is at assisted facility and has known severe AS and moderate MS and severe MAC. Pt has suffered multiple fractures and has recuperated and had been seen for CP in ER. Pt reports that she has been getting progressivley more SOB with getting out of wheelchair. She also has gall bladder issues she was told needs to be removed but would not be done with her level of AS. PT was seen by Dr Lynnette Caffey who felt that TAVR was reasonable option however with her level of other valvular disease, her improvement in symptoms may wait to be seen after. She has an EF of 60% and a mean gradient of with a valve area of 0.76. her mitral valve has a men gradient of . She underwent cath in the past which has not been repeated     Past Medical History:  Diagnosis Date   Anemia    Arthritis    shoulders   Asthma    mild, no inhalers used   Breast cancer (HCC) 12/18/2011   left breast masectomy=metastatic ca in (1/1) lymph node ,invasive ductal ca,2 foci,,dcis,lymph ovascular invasion identified,surgical resection margins neg for ca,additional tissue=benign skin and subcutaneous tissue   Bronchitis    hx of;last time >51yr ago   Depression    takes Celexa daily   Full dentures    Gall stones 2015   GERD (gastroesophageal reflux disease)    takes Prilosec prn   H/O hiatal hernia    History of kidney stones    many yrs ago   Hx of radiation therapy 04/26/12 -06/10/12   left breast   Hyperlipidemia    takes Simvastatin daily   Hypertension    takes Amlodipine and Diovan daily   Insomnia    takes Ambien nightly   Urinary urgency     Past  Surgical History:  Procedure Laterality Date   APPENDECTOMY     BILIARY DILATION  02/07/2022   Procedure: BILIARY DILATION;  Surgeon: Lynann Bologna, MD;  Location: [facility] ENDOSCOPY;  Service: Gastroenterology;;   BILIARY DILATION  03/19/2022   Procedure: BILIARY DILATION;  Surgeon: Lemar Lofty., MD;  Location: [facility] ENDOSCOPY;  Service: Gastroenterology;;   BILIARY STENT PLACEMENT  02/07/2022   Procedure: BILIARY STENT PLACEMENT;  Surgeon: Lynann Bologna, MD;  Location: [facility] ENDOSCOPY;  Service: Gastroenterology;;   BIOPSY  03/19/2022   Procedure: BIOPSY;  Surgeon: Lemar Lofty., MD;  Location: [facility] ENDOSCOPY;  Service: Gastroenterology;;   bladder tack     BREAST BIOPSY  1998   left    DILATION AND CURETTAGE OF UTERUS     ENDOSCOPIC RETROGRADE CHOLANGIOPANCREATOGRAPHY (ERCP) WITH PROPOFOL N/A 02/01/2014   Procedure: ENDOSCOPIC RETROGRADE CHOLANGIOPANCREATOGRAPHY (ERCP) WITH PROPOFOL;  Surgeon: Rachael Fee, MD;  Location: [facility] ENDOSCOPY;  Service: Endoscopy;  Laterality: N/A;   ENDOSCOPIC RETROGRADE CHOLANGIOPANCREATOGRAPHY (ERCP) WITH PROPOFOL N/A 03/19/2022   Procedure: ENDOSCOPIC RETROGRADE CHOLANGIOPANCREATOGRAPHY (ERCP) WITH PROPOFOL;  Surgeon: Meridee Score Netty Starring., MD;  Location: [facility] ENDOSCOPY;  Service:  Gastroenterology;  Laterality: N/A;   ERCP N/A 02/07/2022   Procedure: ENDOSCOPIC RETROGRADE CHOLANGIOPANCREATOGRAPHY (ERCP);  Surgeon: Lynann Bologna, MD;  Location: [facility] ENDOSCOPY;  Service: Gastroenterology;  Laterality: N/A;   ESOPHAGOGASTRODUODENOSCOPY     ESOPHAGOGASTRODUODENOSCOPY N/A 06/26/2016   Procedure: ESOPHAGOGASTRODUODENOSCOPY (EGD);  Surgeon: Rachael Fee, MD;  Location: [facility] ENDOSCOPY;  Service: Endoscopy;  Laterality: N/A;   ESOPHAGOGASTRODUODENOSCOPY (EGD) WITH PROPOFOL  12/19/2013   Procedure: ESOPHAGOGASTRODUODENOSCOPY (EGD) WITH PROPOFOL;  Surgeon: Theda Belfast, MD;  Location: [facility] ENDOSCOPY;  Service: Endoscopy;;   ESOPHAGOGASTRODUODENOSCOPY (EGD) WITH PROPOFOL  N/A 09/10/2016   Procedure: ESOPHAGOGASTRODUODENOSCOPY (EGD) WITH PROPOFOL;  Surgeon: Rachael Fee, MD;  Location: [facility] ENDOSCOPY;  Service: Endoscopy;  Laterality: N/A;   EUS  06/04/2011   Procedure: UPPER ENDOSCOPIC ULTRASOUND (EUS) LINEAR;  Surgeon: Rob Bunting, MD;  Location: [facility] ENDOSCOPY;  Service: Endoscopy;  Laterality: N/A;   EUS N/A 02/01/2014   Procedure: UPPER ENDOSCOPIC ULTRASOUND (EUS) LINEAR;  Surgeon: Rachael Fee, MD;  Location: [facility] ENDOSCOPY;  Service: Endoscopy;  Laterality: N/A;   EXPLORATORY LAPAROTOMY      biopsy of intra-abdominal mass   INTRAMEDULLARY (IM) NAIL INTERTROCHANTERIC Right 10/24/2018   Procedure: INTRAMEDULLARY (IM) NAIL INTERTROCHANTRIC;  Surgeon: Ollen Gross, MD;  Location: [facility] ORS;  Service: Orthopedics;  Laterality: Right;   LAPAROTOMY N/A 11/17/2016   Procedure: EXPLORATORY LAPAROTOMY WITH LYSIS OF ADHESIONS, SMALL BOWEL RESECTION, REPAIR OF INCARCERATED VENTRAL INCISIONAL HERNIA, INSERTION OF BIOLOGIC MESH PATCH,APPLICATION OF WOUND VAC DRESSING;  Surgeon: Darnell Level, MD;  Location: [facility] ORS;  Service: General;  Laterality: N/A;   MASTECTOMY W/ SENTINEL NODE BIOPSY  12/18/2011   Procedure: MASTECTOMY WITH SENTINEL LYMPH NODE BIOPSY;  Surgeon: Emelia Loron, MD;  Location: [facility] OR;  Service: General;  Laterality: Left;   PORT-A-CATH REMOVAL Right 06/15/2013   Procedure: REMOVAL PORT-A-CATH;  Surgeon: Emelia Loron, MD;  Location: [facility];  Service: General;  Laterality: Right;   PORTACATH PLACEMENT  01/27/2012   Procedure: INSERTION PORT-A-CATH;  Surgeon: Emelia Loron, MD;  Location: [facility];  Service: General;  Laterality: Right;  PORT PLACEMENT   REMOVAL OF STONES  02/07/2022   Procedure: REMOVAL OF STONES;  Surgeon: Lynann Bologna, MD;  Location: [facility] ENDOSCOPY;  Service: Gastroenterology;;   REMOVAL OF STONES  03/19/2022   Procedure: REMOVAL OF STONES;  Surgeon: Lemar Lofty., MD;  Location: [facility]  ENDOSCOPY;  Service: Gastroenterology;;   RIGHT/LEFT HEART CATH AND CORONARY ANGIOGRAPHY N/A 05/08/2021   Procedure: RIGHT/LEFT HEART CATH AND CORONARY ANGIOGRAPHY;  Surgeon: Corky Crafts, MD;  Location: [facility] INVASIVE CV LAB;  Service: Cardiovascular;  Laterality: N/A;   SPHINCTEROTOMY  02/07/2022   Procedure: SPHINCTEROTOMY;  Surgeon: Lynann Bologna, MD;  Location: [facility] ENDOSCOPY;  Service: Gastroenterology;;   Burman [place] CHOLANGIOSCOPY N/A 03/19/2022   Procedure: ZOXWRUEA CHOLANGIOSCOPY;  Surgeon: Lemar Lofty., MD;  Location: [facility] ENDOSCOPY;  Service: Gastroenterology;  Laterality: N/A;   STENT REMOVAL  03/19/2022   Procedure: STENT REMOVAL;  Surgeon: Lemar Lofty., MD;  Location: [facility] ENDOSCOPY;  Service: Gastroenterology;;   TOTAL HIP ARTHROPLASTY Right 11/14/2018   Procedure: REMOVAL OF INTRAMEDULLARY NAIL AND POSTERIOR TOTAL HIP ARTHROPLASTY;  Surgeon: Ollen Gross, MD;  Location: [facility] ORS;  Service: Orthopedics;  Laterality: Right;   TOTAL SHOULDER REPLACEMENT  2011   left   TUBAL LIGATION      Social History   Tobacco Use  Smoking Status Never  Smokeless Tobacco Never    Social History   Substance and Sexual Activity  Alcohol Use No    Social History   Socioeconomic History   Marital status: Widowed    Spouse name: Not on file   Number of children: Not on file   Years of education: Not on file   Highest education level: Not on file  Occupational History   Occupation: retired  Tobacco Use   Smoking status: Never   Smokeless tobacco: Never  Vaping Use   Vaping Use: Never used  Substance and Sexual Activity   Alcohol use: No   Drug use: No   Sexual activity: Yes    Birth control/protection: Surgical    Comment: mensus age 84, 1st pregnancy 52, no hrt gg4,p3, 1 babay lived a few hours complications  Other Topics Concern   Not on file  Social History Narrative   Not on file   Social Determinants of Health   Financial Resource Strain: Not on file   Food Insecurity: No Food Insecurity (03/27/2022)   Hunger Vital Sign    Worried About Running Out of Food in the Last Year: Never true    Ran Out of Food in the Last Year: Never true  Transportation Needs: No Transportation Needs (03/27/2022)   PRAPARE - Administrator, Civil Service (Medical): No    Lack of Transportation (Non-Medical): No  Physical Activity: Unknown (10/29/2018)   Exercise Vital Sign    Days of Exercise per Week: Patient declined    Minutes of Exercise per Session: Patient declined  Stress: Not on file  Social Connections: Unknown (10/29/2018)   Social Connection and Isolation Panel [NHANES]    Frequency of Communication with Friends and Family: Patient declined    Frequency of Social Gatherings with Friends and Family: Patient declined    Attends Religious Services: Patient declined    Database administrator or Organizations: Patient declined    Attends Banker Meetings: Patient declined    Marital Status: Patient declined  Intimate Partner Violence: Not At Risk (03/27/2022)   Humiliation, Afraid, Rape, and Kick questionnaire    Fear of Current or Ex-Partner: No    Emotionally Abused: No    Physically Abused: No    Sexually Abused: No    No Known Allergies  Current Outpatient Medications  Medication Sig Dispense Refill   acetaminophen (TYLENOL) 500 MG tablet Take 1,000 mg by mouth every 6 (six) hours as needed for headache (pain).     alendronate (FOSAMAX) 70 MG tablet Take 70 mg by mouth every Sunday.     atorvastatin (LIPITOR) 40 MG tablet Take 40 mg by mouth at bedtime.     Cyanocobalamin (VITAMIN B-12 PO) Take 1 tablet by mouth every evening.     DULoxetine (CYMBALTA) 60 MG capsule Take 60 mg by mouth daily.     ferrous sulfate 325 (65 FE) MG tablet Take 1 tablet (325 mg total) by mouth daily.     gabapentin (NEURONTIN) 300 MG capsule Take 2 capsules (600 mg total) by mouth at bedtime. 90 capsule 3   Menthol, Topical Analgesic,  (BIOFREEZE) 4 % GEL Apply 1 application  topically every 8 (eight) hours as needed.     Multiple Vitamin (MULTIVITAMIN WITH MINERALS) TABS tablet Take 1 tablet by mouth in the morning.     omeprazole (PRILOSEC) 40 MG capsule Take 1 capsule (40 mg total) by mouth daily before breakfast. 30 capsule 12   rOPINIRole (REQUIP) 0.25 MG tablet Take 0.25 mg by mouth at bedtime.     traMADol Janean Sark)  50 MG tablet Take 1 tablet (50 mg total) by mouth 4 (four) times daily as needed for moderate pain (pain.). 5 tablet 0   valsartan-hydrochlorothiazide (DIOVAN-HCT) 80-12.5 MG tablet Take 1 tablet by mouth in the morning.     Vitamin D, Ergocalciferol, (DRISDOL) 50000 units CAPS capsule Take 50,000 Units by mouth every Sunday.     No current facility-administered medications for this visit.   Facility-Administered Medications Ordered in Other Visits  Medication Dose Route Frequency Provider Last Rate Last Admin   sodium chloride 0.9 % injection 10 mL  10 mL Intracatheter PRN Drue Second, MD   10 mL at 03/14/12 1628     Family History  Problem Relation Age of Onset   Prostate cancer Father    Breast cancer Other        Physical Exam: Very frail appearing woman in wheelchair Lungs: decreased Card: loud murmur with rr Ext: mod swelling Neuro: intact     Diagnostic Studies & Laboratory data: I have personally reviewed the following studies and agree with the findings   TTE (02/2022) IMPRESSIONS     1. Normal LV function; calcified aortic valve with moderate to severe AS  (mean gradient 31 mmHg; DI 0.24); severe MAC with moderate MS (mean  gradient 9 mmHg).   2. Left ventricular ejection fraction, by estimation, is 60 to 65%. The  left ventricle has normal function. The left ventricle has no regional  wall motion abnormalities. There is moderate left ventricular hypertrophy.  Left ventricular diastolic function   could not be evaluated.   3. Right ventricular systolic function is normal.  The right ventricular  size is normal. Tricuspid regurgitation signal is inadequate for assessing  PA pressure.   4. Left atrial size was severely dilated.   5. The mitral valve is normal in structure. Mild mitral valve  regurgitation. Moderate mitral stenosis. Severe mitral annular  calcification.   6. The aortic valve is calcified. Aortic valve regurgitation is not  visualized. Moderate to severe aortic valve stenosis.   7. The inferior vena cava is normal in size with greater than 50%  respiratory variability, suggesting right atrial pressure of 3 mmHg.   FINDINGS   Left Ventricle: Left ventricular ejection fraction, by estimation, is 60  to 65%. The left ventricle has normal function. The left ventricle has no  regional wall motion abnormalities. The left ventricular internal cavity  size was normal in size. There is   moderate left ventricular hypertrophy. Left ventricular diastolic  function could not be evaluated due to mitral annular calcification  (moderate or greater). Left ventricular diastolic function could not be  evaluated.   Right Ventricle: The right ventricular size is normal. Right ventricular  systolic function is normal. Tricuspid regurgitation signal is inadequate  for assessing PA pressure. The tricuspid regurgitant velocity is 2.29 m/s,  and with an assumed right atrial   pressure of 5 mmHg, the estimated right ventricular systolic pressure is  26.0 mmHg.   Left Atrium: Left atrial size was severely dilated.   Right Atrium: Right atrial size was normal in size.   Pericardium: There is no evidence of pericardial effusion.   Mitral Valve: The mitral valve is normal in structure. Severe mitral  annular calcification. Mild mitral valve regurgitation. Moderate mitral  valve stenosis. MV peak gradient, 18.2 mmHg. The mean mitral valve  gradient is 9.0 mmHg.   Tricuspid Valve: The tricuspid valve is normal in structure. Tricuspid  valve regurgitation is mild  . No evidence  of tricuspid stenosis.   Aortic Valve: The aortic valve is calcified. Aortic valve regurgitation is  not visualized. Moderate to severe aortic stenosis is present. Aortic  valve mean gradient measures 31.0 mmHg. Aortic valve peak gradient  measures 53.9 mmHg.   Pulmonic Valve: The pulmonic valve was normal in structure. Pulmonic valve  regurgitation is trivial. No evidence of pulmonic stenosis.   Aorta: The aortic root is normal in size and structure.   Venous: The inferior vena cava is normal in size with greater than 50%  respiratory variability, suggesting right atrial pressure of 3 mmHg.   IAS/Shunts: No atrial level shunt detected by color flow Doppler.   Additional Comments: Normal LV function; calcified aortic valve with  moderate to severe AS (mean gradient 31 mmHg; DI 0.24); severe MAC with  moderate MS (mean gradient 9 mmHg).     LEFT VENTRICLE  PLAX 2D  LVIDd:         4.60 cm Diastology  LVIDs:         3.10 cm LV e' lateral: 5.40 cm/s  LV PW:         1.40 cm  LV IVS:        1.30 cm     RIGHT VENTRICLE             IVC  RV Basal diam:  3.10 cm     IVC diam: 1.90 cm  RV S prime:     14.30 cm/s  TAPSE (M-mode): 1.9 cm   LEFT ATRIUM             Index        RIGHT ATRIUM           Index  LA diam:        5.00 cm 3.12 cm/m   RA Area:     16.90 cm  LA Vol (A2C):   80.2 ml 49.97 ml/m  RA Volume:   42.10 ml  26.23 ml/m  LA Vol (A4C):   69.0 ml 42.99 ml/m  LA Biplane Vol: 80.0 ml 49.85 ml/m   AORTIC VALVE                    PULMONIC VALVE  AV Vmax:           367.00 cm/s  PV Vmax:       1.06 m/s  AV Vmean:          261.000 cm/s PV Peak grad:  4.5 mmHg  AV VTI:            0.841 m  AV Peak Grad:      53.9 mmHg  AV Mean Grad:      31.0 mmHg  LVOT Vmax:         95.80 cm/s  LVOT Vmean:        55.500 cm/s  LVOT VTI:          0.206 m  LVOT/AV VTI ratio: 0.24    AORTA  Ao Root diam: 3.60 cm  Ao Asc diam:  3.10 cm   MITRAL VALVE             TRICUSPID  VALVE  MV Peak grad: 18.2 mmHg  TR Peak grad:   21.0 mmHg  MV Mean grad: 9.0 mmHg   TR Vmax:        229.00 cm/s  MV Vmax:      2.13 m/s  MV Vmean:     142.3 cm/s SHUNTS  Systemic VTI: 0.21 m   TAVR CTA (03/2022) FINDINGS: Aortic Valve:   Tricuspid aortic valve. Severely reduced cusp separation. Moderately thickened, severely calcified aortic valve cusps.   AV calcium score: 2283   Virtual Basal Annulus Measurements:   Maximum/Minimum Diameter: 27.5 x 20.8 mm   Perimeter: 76.3 mm   Area: 439 mm2   Mild LVOT calcifications under NCC/LCC commissure.   Membranous septal length: 4.5 mm   Based on these measurements, the annulus would be suitable for a 26 mm Edward Sapien 3 valve. Alternatively, consider a 29 mm Medtronic Evolut Pro valve. Recommend structural heart team discussion for valve selection.   Sinus of Valsalva Measurements:   Non-coronary:  32 mm   Right - coronary:  29 mm   Left - coronary:  30 mm   Sinus of Valsalva Height:   Left: 23.6 mm   Right: 22.5 mm   Aorta: Short segment common origin of the brachiocephalic and left common carotid arteries off the aortic arch.   Sinotubular Junction:  28 mm   Ascending Thoracic Aorta:  29 mm   Aortic Arch:  25 mm   Descending Thoracic Aorta:  25 mm   Coronary Artery Height above Annulus:   Left main: 19.5 mm   Right coronary: 19.1 mm   Coronary Arteries: Normal coronary origin. Right dominance. The study was performed without use of NTG and insufficient for plaque evaluation. Coronary artery calcium seen in 3 vessel distribution.   Optimum Fluoroscopic Angle for Delivery: LAO 9 CAU 9   OTHER:   Left atrial appendage: No thrombus.   Mitral valve: Thickened leaflets, severe mitral annular calcifications.   Pulmonary artery: Mildly dilated main and branch pulmonary arteries.   Pulmonary veins: Normal anatomy.   IMPRESSION: 1. Tricuspid aortic valve with severely  reduced cusp excursion. Moderately thickened and severely calcified aortic valve cusps.   2.  Aortic valve calcium score: 2283   3. Annulus area: 439 mm2, suitable for 26 mm Sapien 3 valve. Mild LVOT calcifications.   4.  Sufficient coronary artery heights from annulus.   5.  Optimum Fluoroscopic Angle for Delivery: LAO 9 CAU 9   6.  Severe mitral annular calcification.   CATH (05/2021) Conclusion      No angiographically apparent coronary artery disease.   Ao sat 94%, PA sat 63%, PA pressure 26/6, mean PA pressure 13 mmHg, mean pulmonary capillary wedge pressure 5 mmHg, cardiac output 5.4 L/min, cardiac index 3.39.   Severe right subclavian tortuosity.  If cardiac cath was needed in the future, would not use right radial approach.   Did not cross aortic valve.    Recent Radiology Findings:       Recent Lab Findings: Lab Results  Component Value Date   WBC 6.9 05/11/2022   HGB 7.9 (L) 05/11/2022   HCT 25.3 (L) 05/11/2022   PLT 213 05/11/2022   GLUCOSE 120 (H) 03/28/2022   TRIG 295 (H) 10/29/2018   ALT 22 03/27/2022   AST 21 03/27/2022   NA 143 03/28/2022   K 4.3 03/28/2022   CL 114 (H) 03/28/2022   CREATININE 1.06 (H) 03/28/2022   BUN 26 (H) 03/28/2022   CO2 24 03/28/2022   INR 1.1 03/27/2022      Assessment / Plan:     Very frail 89yo female with NYHA class 3 symptoms of severe AS with moderated MS and normal LV function. Pt with indication for AVR and with her age, comorbidities and frailty would only be  a TAVR candidate. Her improvement in symptoms will have to be assessed after procedure. We discussed the goals, risks and recovery from procedure and she agrees to proceed and is very concerned about the care she receives at the assisted living place being able to care for her after. She also wishes to have gall bladder to be removed at same time as TAVR and we discussed we couldn't but could be arranged soon therafter. She is not a bailout candidate and I agree  with valve and access per Dr Lynnette Caffey.   I have spent 60 min in review of the records, viewing studies and in face to face with patient and in coordination of future care    Eugenio Hoes 11/01/2022 10:35 AM

## 2022-11-02 ENCOUNTER — Institutional Professional Consult (permissible substitution) (INDEPENDENT_AMBULATORY_CARE_PROVIDER_SITE_OTHER): Payer: Medicare (Managed Care) | Admitting: Thoracic Surgery (Cardiothoracic Vascular Surgery)

## 2022-11-02 ENCOUNTER — Encounter: Payer: Self-pay | Admitting: Thoracic Surgery (Cardiothoracic Vascular Surgery)

## 2022-11-02 VITALS — BP 130/60 | HR 82 | Resp 20 | Ht 60.0 in | Wt 149.0 lb

## 2022-11-02 DIAGNOSIS — I350 Nonrheumatic aortic (valve) stenosis: Secondary | ICD-10-CM

## 2022-11-02 NOTE — Patient Instructions (Signed)
TAVR

## 2022-11-06 ENCOUNTER — Other Ambulatory Visit: Payer: Self-pay

## 2022-11-06 DIAGNOSIS — I350 Nonrheumatic aortic (valve) stenosis: Secondary | ICD-10-CM

## 2022-11-13 ENCOUNTER — Ambulatory Visit (HOSPITAL_COMMUNITY)
Admission: RE | Admit: 2022-11-13 | Discharge: 2022-11-13 | Disposition: A | Discharge status: Home / Self Care | Payer: Medicare (Managed Care) | Source: Ambulatory Visit | Attending: Internal Medicine | Admitting: Internal Medicine

## 2022-11-13 ENCOUNTER — Encounter (HOSPITAL_COMMUNITY)
Admission: RE | Admit: 2022-11-13 | Discharge: 2022-11-13 | Disposition: A | Discharge status: Home / Self Care | Payer: Medicare (Managed Care) | Source: Ambulatory Visit | Attending: Internal Medicine | Admitting: Internal Medicine

## 2022-11-13 ENCOUNTER — Other Ambulatory Visit: Payer: Self-pay

## 2022-11-13 DIAGNOSIS — Z1152 Encounter for screening for COVID-19: Secondary | ICD-10-CM | POA: Diagnosis not present

## 2022-11-13 DIAGNOSIS — Z01818 Encounter for other preprocedural examination: Secondary | ICD-10-CM | POA: Diagnosis not present

## 2022-11-13 DIAGNOSIS — I350 Nonrheumatic aortic (valve) stenosis: Secondary | ICD-10-CM | POA: Insufficient documentation

## 2022-11-13 DIAGNOSIS — I493 Ventricular premature depolarization: Secondary | ICD-10-CM | POA: Diagnosis not present

## 2022-11-13 LAB — COMPREHENSIVE METABOLIC PANEL
ALT: 17 U/L (ref 0–44)
AST: 25 U/L (ref 15–41)
Albumin: 3.2 g/dL — ABNORMAL LOW (ref 3.5–5.0)
Alkaline Phosphatase: 92 U/L (ref 38–126)
Anion gap: 10 (ref 5–15)
BUN: 22 mg/dL (ref 8–23)
CO2: 25 mmol/L (ref 22–32)
Calcium: 8.6 mg/dL — ABNORMAL LOW (ref 8.9–10.3)
Chloride: 104 mmol/L (ref 98–111)
Creatinine, Ser: 0.99 mg/dL (ref 0.44–1.00)
GFR, Estimated: 55 mL/min — ABNORMAL LOW (ref >60)
Glucose, Bld: 117 mg/dL — ABNORMAL HIGH (ref 70–99)
Potassium: 3.9 mmol/L (ref 3.5–5.1)
Sodium: 139 mmol/L (ref 135–145)
Total Bilirubin: 0.7 mg/dL (ref 0.3–1.2)
Total Protein: 6.7 g/dL (ref 6.5–8.1)

## 2022-11-13 LAB — SARS CORONAVIRUS 2 (TAT 6-24 HRS): SARS Coronavirus 2: NEGATIVE

## 2022-11-13 LAB — CBC
HCT: 30.7 % — ABNORMAL LOW (ref 36.0–46.0)
Hemoglobin: 9.6 g/dL — ABNORMAL LOW (ref 12.0–15.0)
MCH: 28.9 pg (ref 26.0–34.0)
MCHC: 31.3 g/dL (ref 30.0–36.0)
MCV: 92.5 fL (ref 80.0–100.0)
Platelets: 207 10*3/uL (ref 150–400)
RBC: 3.32 MIL/uL — ABNORMAL LOW (ref 3.87–5.11)
RDW: 14.2 % (ref 11.5–15.5)
WBC: 10.1 10*3/uL (ref 4.0–10.5)
nRBC: 0 % (ref 0.0–0.2)

## 2022-11-13 LAB — URINALYSIS, ROUTINE W REFLEX MICROSCOPIC
Bilirubin Urine: NEGATIVE
Glucose, UA: NEGATIVE mg/dL
Hgb urine dipstick: NEGATIVE
Ketones, ur: NEGATIVE mg/dL
Leukocytes,Ua: NEGATIVE
Nitrite: NEGATIVE
Protein, ur: NEGATIVE mg/dL
Specific Gravity, Urine: 1.014 (ref 1.005–1.030)
pH: 5 (ref 5.0–8.0)

## 2022-11-13 LAB — PROTIME-INR
INR: 1.1 (ref 0.8–1.2)
Prothrombin Time: 14.4 seconds (ref 11.4–15.2)

## 2022-11-13 LAB — SURGICAL PCR SCREEN
MRSA, PCR: NEGATIVE
Staphylococcus aureus: NEGATIVE

## 2022-11-13 NOTE — Progress Notes (Addendum)
Patient signed all consents at PAT lab appointment. CHG soap and instructions were given to patient and patients daughter, Kennyth Arnold. CHG surgical prep reviewed with patient and all questions answered. Additional set of instructions were given to pts daughter to give to patients facility. Daughter concerned the facility will not give her two baths prior to surgery. RN strongly encouraged her to have the facility call us at [phone] to explain the importance of these two baths. If there is any concern that two were not completed, a note was placed on patients chart for her to receive 6 CHG wipes DOS.  Pt denies any respiratory illness/infection in the last two months.  Med Rec note completed by time of lab appointment. Pts daughter, Kennyth Arnold, given the Pharmacy Call Center phone number and instructed to call today to review pts current medication prior to surgery Tuesday. Daughter understood instructions.

## 2022-11-16 MED ORDER — CEFAZOLIN SODIUM-DEXTROSE 2-4 GM/100ML-% IV SOLN
2.0000 g | INTRAVENOUS | Status: AC
  Administered 2022-11-17: 2 g via INTRAVENOUS
  Filled 2022-11-16: qty 100

## 2022-11-16 MED ORDER — HEPARIN 30,000 UNITS/1000 ML (OHS) CELLSAVER SOLUTION
Status: DC
  Filled 2022-11-16 (×2): qty 1000

## 2022-11-16 MED ORDER — POTASSIUM CHLORIDE 2 MEQ/ML IV SOLN
80.0000 meq | INTRAVENOUS | Status: DC
  Filled 2022-11-16 (×2): qty 40

## 2022-11-16 MED ORDER — NOREPINEPHRINE 4 MG/250ML-% IV SOLN
0.0000 ug/min | INTRAVENOUS | Status: AC
  Administered 2022-11-17: 2 ug/min via INTRAVENOUS
  Filled 2022-11-16: qty 250

## 2022-11-16 MED ORDER — MAGNESIUM SULFATE 50 % IJ SOLN
40.0000 meq | INTRAMUSCULAR | Status: DC
  Filled 2022-11-16 (×2): qty 9.85

## 2022-11-16 MED ORDER — DEXMEDETOMIDINE HCL IN NACL 400 MCG/100ML IV SOLN
0.1000 ug/kg/h | INTRAVENOUS | Status: AC
  Administered 2022-11-17: 16.92 ug via INTRAVENOUS
  Filled 2022-11-16 (×2): qty 100

## 2022-11-16 NOTE — H&P (Signed)
[address]       [place] [address]             [phone]                                   NEYVA FRANKUM [facility] Medical Record #[id number] Date of Birth: 11/28/1932   Orbie Pyo, MD Sigmund Hazel, MD   Chief Complaint: SOB and CP     History of Present Illness:     Pt is a very frail and elderly 89 yo who is confined to wheelchair and is at assisted facility and has known severe AS and moderate MS and severe MAC. Pt has suffered multiple fractures and has recuperated and had been seen for CP in ER. Pt reports that she has been getting progressivley more SOB with getting out of wheelchair. She also has gall bladder issues she was told needs to be removed but would not be done with her level of AS. PT was seen by Dr Lynnette Caffey who felt that TAVR was reasonable option however with her level of other valvular disease, her improvement in symptoms may wait to be seen after. She has an EF of 60% and a mean gradient of with a valve area of 0.76. her mitral valve has a men gradient of . She underwent cath in the past which has not been repeated           Past Medical History:  Diagnosis Date   Anemia     Arthritis      shoulders   Asthma      mild, no inhalers used   Breast cancer (HCC) 12/18/2011    left breast masectomy=metastatic ca in (1/1) lymph node ,invasive ductal ca,2 foci,,dcis,lymph ovascular invasion identified,surgical resection margins neg for ca,additional tissue=benign skin and subcutaneous tissue   Bronchitis      hx of;last time >71yr ago   Depression      takes Celexa daily   Full dentures     Gall stones 2015   GERD (gastroesophageal reflux disease)      takes Prilosec prn   H/O hiatal hernia     History of kidney stones      many yrs ago   Hx of radiation therapy 04/26/12 -06/10/12    left breast   Hyperlipidemia      takes Simvastatin daily   Hypertension      takes Amlodipine and Diovan daily   Insomnia       takes Ambien nightly   Urinary urgency             Past Surgical History:  Procedure Laterality Date   APPENDECTOMY       BILIARY DILATION   02/07/2022    Procedure: BILIARY DILATION;  Surgeon: Lynann Bologna, MD;  Location: [facility] ENDOSCOPY;  Service: Gastroenterology;;   BILIARY DILATION   03/19/2022    Procedure: BILIARY DILATION;  Surgeon: Lemar Lofty., MD;  Location: [facility] ENDOSCOPY;  Service: Gastroenterology;;   BILIARY STENT PLACEMENT   02/07/2022    Procedure: BILIARY STENT PLACEMENT;  Surgeon: Lynann Bologna, MD;  Location: [facility] ENDOSCOPY;  Service: Gastroenterology;;   BIOPSY   03/19/2022    Procedure: BIOPSY;  Surgeon: Lemar Lofty., MD;  Location: [facility] ENDOSCOPY;  Service: Gastroenterology;;   bladder tack       BREAST BIOPSY   1998  left    DILATION AND CURETTAGE OF UTERUS       ENDOSCOPIC RETROGRADE CHOLANGIOPANCREATOGRAPHY (ERCP) WITH PROPOFOL N/A 02/01/2014    Procedure: ENDOSCOPIC RETROGRADE CHOLANGIOPANCREATOGRAPHY (ERCP) WITH PROPOFOL;  Surgeon: Rachael Fee, MD;  Location: [facility] ENDOSCOPY;  Service: Endoscopy;  Laterality: N/A;   ENDOSCOPIC RETROGRADE CHOLANGIOPANCREATOGRAPHY (ERCP) WITH PROPOFOL N/A 03/19/2022    Procedure: ENDOSCOPIC RETROGRADE CHOLANGIOPANCREATOGRAPHY (ERCP) WITH PROPOFOL;  Surgeon: Meridee Score Netty Starring., MD;  Location: [facility] ENDOSCOPY;  Service: Gastroenterology;  Laterality: N/A;   ERCP N/A 02/07/2022    Procedure: ENDOSCOPIC RETROGRADE CHOLANGIOPANCREATOGRAPHY (ERCP);  Surgeon: Lynann Bologna, MD;  Location: [facility] ENDOSCOPY;  Service: Gastroenterology;  Laterality: N/A;   ESOPHAGOGASTRODUODENOSCOPY       ESOPHAGOGASTRODUODENOSCOPY N/A 06/26/2016    Procedure: ESOPHAGOGASTRODUODENOSCOPY (EGD);  Surgeon: Rachael Fee, MD;  Location: [facility] ENDOSCOPY;  Service: Endoscopy;  Laterality: N/A;   ESOPHAGOGASTRODUODENOSCOPY (EGD) WITH PROPOFOL   12/19/2013    Procedure: ESOPHAGOGASTRODUODENOSCOPY (EGD) WITH PROPOFOL;  Surgeon: Theda Belfast, MD;   Location: [facility] ENDOSCOPY;  Service: Endoscopy;;   ESOPHAGOGASTRODUODENOSCOPY (EGD) WITH PROPOFOL N/A 09/10/2016    Procedure: ESOPHAGOGASTRODUODENOSCOPY (EGD) WITH PROPOFOL;  Surgeon: Rachael Fee, MD;  Location: [facility] ENDOSCOPY;  Service: Endoscopy;  Laterality: N/A;   EUS   06/04/2011    Procedure: UPPER ENDOSCOPIC ULTRASOUND (EUS) LINEAR;  Surgeon: Rob Bunting, MD;  Location: [facility] ENDOSCOPY;  Service: Endoscopy;  Laterality: N/A;   EUS N/A 02/01/2014    Procedure: UPPER ENDOSCOPIC ULTRASOUND (EUS) LINEAR;  Surgeon: Rachael Fee, MD;  Location: [facility] ENDOSCOPY;  Service: Endoscopy;  Laterality: N/A;   EXPLORATORY LAPAROTOMY         biopsy of intra-abdominal mass   INTRAMEDULLARY (IM) NAIL INTERTROCHANTERIC Right 10/24/2018    Procedure: INTRAMEDULLARY (IM) NAIL INTERTROCHANTRIC;  Surgeon: Ollen Gross, MD;  Location: [facility] ORS;  Service: Orthopedics;  Laterality: Right;   LAPAROTOMY N/A 11/17/2016    Procedure: EXPLORATORY LAPAROTOMY WITH LYSIS OF ADHESIONS, SMALL BOWEL RESECTION, REPAIR OF INCARCERATED VENTRAL INCISIONAL HERNIA, INSERTION OF BIOLOGIC MESH PATCH,APPLICATION OF WOUND VAC DRESSING;  Surgeon: Darnell Level, MD;  Location: [facility] ORS;  Service: General;  Laterality: N/A;   MASTECTOMY W/ SENTINEL NODE BIOPSY   12/18/2011    Procedure: MASTECTOMY WITH SENTINEL LYMPH NODE BIOPSY;  Surgeon: Emelia Loron, MD;  Location: [facility] OR;  Service: General;  Laterality: Left;   PORT-A-CATH REMOVAL Right 06/15/2013    Procedure: REMOVAL PORT-A-CATH;  Surgeon: Emelia Loron, MD;  Location: [facility];  Service: General;  Laterality: Right;   PORTACATH PLACEMENT   01/27/2012    Procedure: INSERTION PORT-A-CATH;  Surgeon: Emelia Loron, MD;  Location: [facility];  Service: General;  Laterality: Right;  PORT PLACEMENT   REMOVAL OF STONES   02/07/2022    Procedure: REMOVAL OF STONES;  Surgeon: Lynann Bologna, MD;  Location: [facility] ENDOSCOPY;  Service: Gastroenterology;;   REMOVAL  OF STONES   03/19/2022    Procedure: REMOVAL OF STONES;  Surgeon: Lemar Lofty., MD;  Location: [facility] ENDOSCOPY;  Service: Gastroenterology;;   RIGHT/LEFT HEART CATH AND CORONARY ANGIOGRAPHY N/A 05/08/2021    Procedure: RIGHT/LEFT HEART CATH AND CORONARY ANGIOGRAPHY;  Surgeon: Corky Crafts, MD;  Location: [facility] INVASIVE CV LAB;  Service: Cardiovascular;  Laterality: N/A;   SPHINCTEROTOMY   02/07/2022    Procedure: SPHINCTEROTOMY;  Surgeon: Lynann Bologna, MD;  Location: [facility] ENDOSCOPY;  Service: Gastroenterology;;   [place] [place] CHOLANGIOSCOPY N/A 03/19/2022    Procedure: [id number] CHOLANGIOSCOPY;  Surgeon: Lemar Lofty., MD;  Location: [facility]  ENDOSCOPY;  Service: Gastroenterology;  Laterality: N/A;   STENT REMOVAL   03/19/2022    Procedure: STENT REMOVAL;  Surgeon: Lemar Lofty., MD;  Location: [facility] ENDOSCOPY;  Service: Gastroenterology;;   TOTAL HIP ARTHROPLASTY Right 11/14/2018    Procedure: REMOVAL OF INTRAMEDULLARY NAIL AND POSTERIOR TOTAL HIP ARTHROPLASTY;  Surgeon: Ollen Gross, MD;  Location: [facility] ORS;  Service: Orthopedics;  Laterality: Right;   TOTAL SHOULDER REPLACEMENT   2011    left   TUBAL LIGATION          Social History       Tobacco Use  Smoking Status Never  Smokeless Tobacco Never    Social History       Substance and Sexual Activity  Alcohol Use No      Social History         Socioeconomic History   Marital status: Widowed      Spouse name: Not on file   Number of children: Not on file   Years of education: Not on file   Highest education level: Not on file  Occupational History   Occupation: retired  Tobacco Use   Smoking status: Never   Smokeless tobacco: Never  Vaping Use   Vaping Use: Never used  Substance and Sexual Activity   Alcohol use: No   Drug use: No   Sexual activity: Yes      Birth control/protection: Surgical      Comment: mensus age 65, 1st pregnancy 76, no hrt gg4,p3, 1 babay lived a few hours complications  Other  Topics Concern   Not on file  Social History Narrative   Not on file    Social Determinants of Health        Financial Resource Strain: Not on file  Food Insecurity: No Food Insecurity (03/27/2022)    Hunger Vital Sign     Worried About Running Out of Food in the Last Year: Never true     Ran Out of Food in the Last Year: Never true  Transportation Needs: No Transportation Needs (03/27/2022)    PRAPARE - Therapist, art (Medical): No     Lack of Transportation (Non-Medical): No  Physical Activity: Unknown (10/29/2018)    Exercise Vital Sign     Days of Exercise per Week: Patient declined     Minutes of Exercise per Session: Patient declined  Stress: Not on file  Social Connections: Unknown (10/29/2018)    Social Connection and Isolation Panel [NHANES]     Frequency of Communication with Friends and Family: Patient declined     Frequency of Social Gatherings with Friends and Family: Patient declined     Attends Religious Services: Patient declined     Database administrator or Organizations: Patient declined     Attends Banker Meetings: Patient declined     Marital Status: Patient declined  Intimate Partner Violence: Not At Risk (03/27/2022)    Humiliation, Afraid, Rape, and Kick questionnaire     Fear of Current or Ex-Partner: No     Emotionally Abused: No     Physically Abused: No     Sexually Abused: No      No Known Allergies         Current Outpatient Medications  Medication Sig Dispense Refill   acetaminophen (TYLENOL) 500 MG tablet Take 1,000 mg by mouth every 6 (six) hours as needed for headache (pain).       alendronate (FOSAMAX) 70 MG  tablet Take 70 mg by mouth every Sunday.       atorvastatin (LIPITOR) 40 MG tablet Take 40 mg by mouth at bedtime.       Cyanocobalamin (VITAMIN B-12 PO) Take 1 tablet by mouth every evening.       DULoxetine (CYMBALTA) 60 MG capsule Take 60 mg by mouth daily.       ferrous sulfate 325 (65 FE)  MG tablet Take 1 tablet (325 mg total) by mouth daily.       gabapentin (NEURONTIN) 300 MG capsule Take 2 capsules (600 mg total) by mouth at bedtime. 90 capsule 3   Menthol, Topical Analgesic, (BIOFREEZE) 4 % GEL Apply 1 application  topically every 8 (eight) hours as needed.       Multiple Vitamin (MULTIVITAMIN WITH MINERALS) TABS tablet Take 1 tablet by mouth in the morning.       omeprazole (PRILOSEC) 40 MG capsule Take 1 capsule (40 mg total) by mouth daily before breakfast. 30 capsule 12   rOPINIRole (REQUIP) 0.25 MG tablet Take 0.25 mg by mouth at bedtime.       traMADol (ULTRAM) 50 MG tablet Take 1 tablet (50 mg total) by mouth 4 (four) times daily as needed for moderate pain (pain.). 5 tablet 0   valsartan-hydrochlorothiazide (DIOVAN-HCT) 80-12.5 MG tablet Take 1 tablet by mouth in the morning.       Vitamin D, Ergocalciferol, (DRISDOL) 50000 units CAPS capsule Take 50,000 Units by mouth every Sunday.        No current facility-administered medications for this visit.             Facility-Administered Medications Ordered in Other Visits  Medication Dose Route Frequency Provider Last Rate Last Admin   sodium chloride 0.9 % injection 10 mL  10 mL Intracatheter PRN Drue Second, MD   10 mL at 03/14/12 1628             Family History  Problem Relation Age of Onset   Prostate cancer Father     Breast cancer Other              Physical Exam: Very frail appearing woman in wheelchair Lungs: decreased Card: loud murmur with rr Ext: mod swelling Neuro: intact         Diagnostic Studies & Laboratory data: I have personally reviewed the following studies and agree with the findings   TTE (02/2022) IMPRESSIONS     1. Normal LV function; calcified aortic valve with moderate to severe AS  (mean gradient 31 mmHg; DI 0.24); severe MAC with moderate MS (mean  gradient 9 mmHg).   2. Left ventricular ejection fraction, by estimation, is 60 to 65%. The  left ventricle has normal  function. The left ventricle has no regional  wall motion abnormalities. There is moderate left ventricular hypertrophy.  Left ventricular diastolic function   could not be evaluated.   3. Right ventricular systolic function is normal. The right ventricular  size is normal. Tricuspid regurgitation signal is inadequate for assessing  PA pressure.   4. Left atrial size was severely dilated.   5. The mitral valve is normal in structure. Mild mitral valve  regurgitation. Moderate mitral stenosis. Severe mitral annular  calcification.   6. The aortic valve is calcified. Aortic valve regurgitation is not  visualized. Moderate to severe aortic valve stenosis.   7. The inferior vena cava is normal in size with greater than 50%  respiratory variability, suggesting right atrial pressure of 3  mmHg.   FINDINGS   Left Ventricle: Left ventricular ejection fraction, by estimation, is 60  to 65%. The left ventricle has normal function. The left ventricle has no  regional wall motion abnormalities. The left ventricular internal cavity  size was normal in size. There is   moderate left ventricular hypertrophy. Left ventricular diastolic  function could not be evaluated due to mitral annular calcification  (moderate or greater). Left ventricular diastolic function could not be  evaluated.   Right Ventricle: The right ventricular size is normal. Right ventricular  systolic function is normal. Tricuspid regurgitation signal is inadequate  for assessing PA pressure. The tricuspid regurgitant velocity is 2.29 m/s,  and with an assumed right atrial   pressure of 5 mmHg, the estimated right ventricular systolic pressure is  26.0 mmHg.   Left Atrium: Left atrial size was severely dilated.   Right Atrium: Right atrial size was normal in size.   Pericardium: There is no evidence of pericardial effusion.   Mitral Valve: The mitral valve is normal in structure. Severe mitral  annular calcification. Mild  mitral valve regurgitation. Moderate mitral  valve stenosis. MV peak gradient, 18.2 mmHg. The mean mitral valve  gradient is 9.0 mmHg.   Tricuspid Valve: The tricuspid valve is normal in structure. Tricuspid  valve regurgitation is mild . No evidence of tricuspid stenosis.   Aortic Valve: The aortic valve is calcified. Aortic valve regurgitation is  not visualized. Moderate to severe aortic stenosis is present. Aortic  valve mean gradient measures 31.0 mmHg. Aortic valve peak gradient  measures 53.9 mmHg.   Pulmonic Valve: The pulmonic valve was normal in structure. Pulmonic valve  regurgitation is trivial. No evidence of pulmonic stenosis.   Aorta: The aortic root is normal in size and structure.   Venous: The inferior vena cava is normal in size with greater than 50%  respiratory variability, suggesting right atrial pressure of 3 mmHg.   IAS/Shunts: No atrial level shunt detected by color flow Doppler.   Additional Comments: Normal LV function; calcified aortic valve with  moderate to severe AS (mean gradient 31 mmHg; DI 0.24); severe MAC with  moderate MS (mean gradient 9 mmHg).     LEFT VENTRICLE  PLAX 2D  LVIDd:         4.60 cm Diastology  LVIDs:         3.10 cm LV e' lateral: 5.40 cm/s  LV PW:         1.40 cm  LV IVS:        1.30 cm     RIGHT VENTRICLE             IVC  RV Basal diam:  3.10 cm     IVC diam: 1.90 cm  RV S prime:     14.30 cm/s  TAPSE (M-mode): 1.9 cm   LEFT ATRIUM             Index        RIGHT ATRIUM           Index  LA diam:        5.00 cm 3.12 cm/m   RA Area:     16.90 cm  LA Vol (A2C):   80.2 ml 49.97 ml/m  RA Volume:   42.10 ml  26.23 ml/m  LA Vol (A4C):   69.0 ml 42.99 ml/m  LA Biplane Vol: 80.0 ml 49.85 ml/m   AORTIC VALVE  PULMONIC VALVE  AV Vmax:           367.00 cm/s  PV Vmax:       1.06 m/s  AV Vmean:          261.000 cm/s PV Peak grad:  4.5 mmHg  AV VTI:            0.841 m  AV Peak Grad:      53.9 mmHg  AV Mean  Grad:      31.0 mmHg  LVOT Vmax:         95.80 cm/s  LVOT Vmean:        55.500 cm/s  LVOT VTI:          0.206 m  LVOT/AV VTI ratio: 0.24    AORTA  Ao Root diam: 3.60 cm  Ao Asc diam:  3.10 cm   MITRAL VALVE             TRICUSPID VALVE  MV Peak grad: 18.2 mmHg  TR Peak grad:   21.0 mmHg  MV Mean grad: 9.0 mmHg   TR Vmax:        229.00 cm/s  MV Vmax:      2.13 m/s  MV Vmean:     142.3 cm/s SHUNTS                           Systemic VTI: 0.21 m    TAVR CTA (03/2022) FINDINGS: Aortic Valve:   Tricuspid aortic valve. Severely reduced cusp separation. Moderately thickened, severely calcified aortic valve cusps.   AV calcium score: 2283   Virtual Basal Annulus Measurements:   Maximum/Minimum Diameter: 27.5 x 20.8 mm   Perimeter: 76.3 mm   Area: 439 mm2   Mild LVOT calcifications under NCC/LCC commissure.   Membranous septal length: 4.5 mm   Based on these measurements, the annulus would be suitable for a 26 mm Edward Sapien 3 valve. Alternatively, consider a 29 mm Medtronic Evolut Pro valve. Recommend structural heart team discussion for valve selection.   Sinus of Valsalva Measurements:   Non-coronary:  32 mm   Right - coronary:  29 mm   Left - coronary:  30 mm   Sinus of Valsalva Height:   Left: 23.6 mm   Right: 22.5 mm   Aorta: Short segment common origin of the brachiocephalic and left common carotid arteries off the aortic arch.   Sinotubular Junction:  28 mm   Ascending Thoracic Aorta:  29 mm   Aortic Arch:  25 mm   Descending Thoracic Aorta:  25 mm   Coronary Artery Height above Annulus:   Left main: 19.5 mm   Right coronary: 19.1 mm   Coronary Arteries: Normal coronary origin. Right dominance. The study was performed without use of NTG and insufficient for plaque evaluation. Coronary artery calcium seen in 3 vessel distribution.   Optimum Fluoroscopic Angle for Delivery: LAO 9 CAU 9   OTHER:   Left atrial appendage: No thrombus.    Mitral valve: Thickened leaflets, severe mitral annular calcifications.   Pulmonary artery: Mildly dilated main and branch pulmonary arteries.   Pulmonary veins: Normal anatomy.   IMPRESSION: 1. Tricuspid aortic valve with severely reduced cusp excursion. Moderately thickened and severely calcified aortic valve cusps.   2.  Aortic valve calcium score: 2283   3. Annulus area: 439 mm2, suitable for 26 mm Sapien 3 valve. Mild LVOT calcifications.   4.  Sufficient coronary artery heights  from annulus.   5.  Optimum Fluoroscopic Angle for Delivery: LAO 9 CAU 9   6.  Severe mitral annular calcification.   CATH (05/2021) Conclusion       No angiographically apparent coronary artery disease.   Ao sat 94%, PA sat 63%, PA pressure 26/6, mean PA pressure 13 mmHg, mean pulmonary capillary wedge pressure 5 mmHg, cardiac output 5.4 L/min, cardiac index 3.39.   Severe right subclavian tortuosity.  If cardiac cath was needed in the future, would not use right radial approach.   Did not cross aortic valve.    Recent Radiology Findings:        Recent Lab Findings: Recent Labs       Lab Results  Component Value Date    WBC 6.9 05/11/2022    HGB 7.9 (L) 05/11/2022    HCT 25.3 (L) 05/11/2022    PLT 213 05/11/2022    GLUCOSE 120 (H) 03/28/2022    TRIG 295 (H) 10/29/2018    ALT 22 03/27/2022    AST 21 03/27/2022    NA 143 03/28/2022    K 4.3 03/28/2022    CL 114 (H) 03/28/2022    CREATININE 1.06 (H) 03/28/2022    BUN 26 (H) 03/28/2022    CO2 24 03/28/2022    INR 1.1 03/27/2022            Assessment / Plan:     Very frail 89yo female with NYHA class 3 symptoms of severe AS with moderated MS and normal LV function. Pt with indication for AVR and with her age, comorbidities and frailty would only be a TAVR candidate. Her improvement in symptoms will have to be assessed after procedure. We discussed the goals, risks and recovery from procedure and she agrees to proceed and is very  concerned about the care she receives at the assisted living place being able to care for her after. She also wishes to have gall bladder to be removed at same time as TAVR and we discussed we couldn't but could be arranged soon therafter. She is not a bailout candidate and I agree with valve and access per Dr Lynnette Caffey.

## 2022-11-17 ENCOUNTER — Encounter (HOSPITAL_COMMUNITY)
Admission: AD | Disposition: A | Discharge status: Home / Self Care | Payer: Self-pay | Source: Home / Self Care | Attending: Internal Medicine

## 2022-11-17 ENCOUNTER — Encounter (HOSPITAL_COMMUNITY): Payer: Self-pay | Admitting: Internal Medicine

## 2022-11-17 ENCOUNTER — Inpatient Hospital Stay (HOSPITAL_COMMUNITY): Payer: Medicare (Managed Care)

## 2022-11-17 ENCOUNTER — Ambulatory Visit (HOSPITAL_COMMUNITY): Payer: Medicare (Managed Care)

## 2022-11-17 ENCOUNTER — Other Ambulatory Visit: Payer: Self-pay

## 2022-11-17 ENCOUNTER — Inpatient Hospital Stay (HOSPITAL_COMMUNITY)
Admission: AD | Admit: 2022-11-17 | Discharge: 2022-11-18 | DRG: 266 | Disposition: A | Discharge status: Home / Self Care | Payer: Medicare (Managed Care) | Attending: Internal Medicine | Admitting: Internal Medicine

## 2022-11-17 ENCOUNTER — Inpatient Hospital Stay (HOSPITAL_COMMUNITY): Payer: Medicare (Managed Care) | Admitting: Physician Assistant

## 2022-11-17 DIAGNOSIS — K219 Gastro-esophageal reflux disease without esophagitis: Secondary | ICD-10-CM | POA: Diagnosis present

## 2022-11-17 DIAGNOSIS — I110 Hypertensive heart disease with heart failure: Secondary | ICD-10-CM

## 2022-11-17 DIAGNOSIS — I350 Nonrheumatic aortic (valve) stenosis: Secondary | ICD-10-CM

## 2022-11-17 DIAGNOSIS — Z952 Presence of prosthetic heart valve: Secondary | ICD-10-CM | POA: Diagnosis not present

## 2022-11-17 DIAGNOSIS — G4700 Insomnia, unspecified: Secondary | ICD-10-CM | POA: Diagnosis present

## 2022-11-17 DIAGNOSIS — Z853 Personal history of malignant neoplasm of breast: Secondary | ICD-10-CM | POA: Diagnosis not present

## 2022-11-17 DIAGNOSIS — D62 Acute posthemorrhagic anemia: Secondary | ICD-10-CM | POA: Diagnosis not present

## 2022-11-17 DIAGNOSIS — Z96641 Presence of right artificial hip joint: Secondary | ICD-10-CM | POA: Diagnosis present

## 2022-11-17 DIAGNOSIS — Z96612 Presence of left artificial shoulder joint: Secondary | ICD-10-CM | POA: Diagnosis present

## 2022-11-17 DIAGNOSIS — F32A Depression, unspecified: Secondary | ICD-10-CM | POA: Diagnosis present

## 2022-11-17 DIAGNOSIS — Z79899 Other long term (current) drug therapy: Secondary | ICD-10-CM

## 2022-11-17 DIAGNOSIS — R296 Repeated falls: Secondary | ICD-10-CM | POA: Diagnosis present

## 2022-11-17 DIAGNOSIS — I509 Heart failure, unspecified: Secondary | ICD-10-CM

## 2022-11-17 DIAGNOSIS — M1990 Unspecified osteoarthritis, unspecified site: Secondary | ICD-10-CM | POA: Diagnosis present

## 2022-11-17 DIAGNOSIS — Z803 Family history of malignant neoplasm of breast: Secondary | ICD-10-CM

## 2022-11-17 DIAGNOSIS — E785 Hyperlipidemia, unspecified: Secondary | ICD-10-CM | POA: Diagnosis present

## 2022-11-17 DIAGNOSIS — Z006 Encounter for examination for normal comparison and control in clinical research program: Secondary | ICD-10-CM

## 2022-11-17 DIAGNOSIS — Z8042 Family history of malignant neoplasm of prostate: Secondary | ICD-10-CM

## 2022-11-17 DIAGNOSIS — I3481 Nonrheumatic mitral (valve) annulus calcification: Secondary | ICD-10-CM | POA: Diagnosis not present

## 2022-11-17 DIAGNOSIS — Z87442 Personal history of urinary calculi: Secondary | ICD-10-CM

## 2022-11-17 DIAGNOSIS — K8031 Calculus of bile duct with cholangitis, unspecified, with obstruction: Secondary | ICD-10-CM | POA: Diagnosis present

## 2022-11-17 DIAGNOSIS — Z923 Personal history of irradiation: Secondary | ICD-10-CM | POA: Diagnosis not present

## 2022-11-17 DIAGNOSIS — J45909 Unspecified asthma, uncomplicated: Secondary | ICD-10-CM | POA: Diagnosis present

## 2022-11-17 DIAGNOSIS — R54 Age-related physical debility: Secondary | ICD-10-CM | POA: Diagnosis not present

## 2022-11-17 DIAGNOSIS — D638 Anemia in other chronic diseases classified elsewhere: Secondary | ICD-10-CM | POA: Diagnosis present

## 2022-11-17 DIAGNOSIS — N1831 Chronic kidney disease, stage 3a: Secondary | ICD-10-CM | POA: Diagnosis not present

## 2022-11-17 DIAGNOSIS — D631 Anemia in chronic kidney disease: Secondary | ICD-10-CM | POA: Diagnosis not present

## 2022-11-17 DIAGNOSIS — I5033 Acute on chronic diastolic (congestive) heart failure: Secondary | ICD-10-CM | POA: Diagnosis present

## 2022-11-17 DIAGNOSIS — I130 Hypertensive heart and chronic kidney disease with heart failure and stage 1 through stage 4 chronic kidney disease, or unspecified chronic kidney disease: Secondary | ICD-10-CM | POA: Diagnosis not present

## 2022-11-17 DIAGNOSIS — E43 Unspecified severe protein-calorie malnutrition: Secondary | ICD-10-CM | POA: Diagnosis not present

## 2022-11-17 DIAGNOSIS — G5600 Carpal tunnel syndrome, unspecified upper limb: Secondary | ICD-10-CM | POA: Diagnosis present

## 2022-11-17 DIAGNOSIS — Z993 Dependence on wheelchair: Secondary | ICD-10-CM

## 2022-11-17 DIAGNOSIS — Z9012 Acquired absence of left breast and nipple: Secondary | ICD-10-CM

## 2022-11-17 DIAGNOSIS — D72828 Other elevated white blood cell count: Secondary | ICD-10-CM | POA: Diagnosis not present

## 2022-11-17 DIAGNOSIS — Z6826 Body mass index (BMI) 26.0-26.9, adult: Secondary | ICD-10-CM

## 2022-11-17 DIAGNOSIS — I10 Essential (primary) hypertension: Secondary | ICD-10-CM | POA: Diagnosis present

## 2022-11-17 DIAGNOSIS — M47816 Spondylosis without myelopathy or radiculopathy, lumbar region: Secondary | ICD-10-CM | POA: Diagnosis present

## 2022-11-17 DIAGNOSIS — Z7983 Long term (current) use of bisphosphonates: Secondary | ICD-10-CM

## 2022-11-17 HISTORY — PX: TRANSCATHETER AORTIC VALVE REPLACEMENT, TRANSFEMORAL: SHX6400

## 2022-11-17 HISTORY — DX: Presence of prosthetic heart valve: Z95.2

## 2022-11-17 HISTORY — DX: Nonrheumatic aortic (valve) stenosis: I35.0

## 2022-11-17 HISTORY — PX: INTRAOPERATIVE TRANSTHORACIC ECHOCARDIOGRAM: SHX6523

## 2022-11-17 LAB — BPAM RBC

## 2022-11-17 LAB — POCT I-STAT, CHEM 8
BUN: 18 mg/dL (ref 8–23)
BUN: 20 mg/dL (ref 8–23)
Calcium, Ion: 1.17 mmol/L (ref 1.15–1.40)
Calcium, Ion: 1.22 mmol/L (ref 1.15–1.40)
Chloride: 103 mmol/L (ref 98–111)
Chloride: 106 mmol/L (ref 98–111)
Creatinine, Ser: 0.7 mg/dL (ref 0.44–1.00)
Creatinine, Ser: 0.9 mg/dL (ref 0.44–1.00)
Glucose, Bld: 122 mg/dL — ABNORMAL HIGH (ref 70–99)
Glucose, Bld: 127 mg/dL — ABNORMAL HIGH (ref 70–99)
HCT: 17 % — ABNORMAL LOW (ref 36.0–46.0)
HCT: 20 % — ABNORMAL LOW (ref 36.0–46.0)
Hemoglobin: 5.8 g/dL — CL (ref 12.0–15.0)
Hemoglobin: 6.8 g/dL — CL (ref 12.0–15.0)
Potassium: 3.5 mmol/L (ref 3.5–5.1)
Potassium: 4 mmol/L (ref 3.5–5.1)
Sodium: 141 mmol/L (ref 135–145)
Sodium: 143 mmol/L (ref 135–145)
TCO2: 23 mmol/L (ref 22–32)
TCO2: 24 mmol/L (ref 22–32)

## 2022-11-17 LAB — ECHOCARDIOGRAM LIMITED
AR max vel: 1.49 cm2
AV Area VTI: 1.61 cm2
AV Area mean vel: 1.42 cm2
AV Mean grad: 26.7 mmHg
AV Peak grad: 34.7 mmHg
Ao pk vel: 2.95 m/s
MV VTI: 1.72 cm2

## 2022-11-17 LAB — CBC
HCT: 17.9 % — ABNORMAL LOW (ref 36.0–46.0)
Hemoglobin: 5.4 g/dL — CL (ref 12.0–15.0)
MCH: 28.9 pg (ref 26.0–34.0)
MCHC: 30.2 g/dL (ref 30.0–36.0)
MCV: 95.7 fL (ref 80.0–100.0)
Platelets: 96 10*3/uL — ABNORMAL LOW (ref 150–400)
RBC: 1.87 MIL/uL — ABNORMAL LOW (ref 3.87–5.11)
RDW: 13.9 % (ref 11.5–15.5)
WBC: 7.2 10*3/uL (ref 4.0–10.5)
nRBC: 0 % (ref 0.0–0.2)

## 2022-11-17 LAB — POCT ACTIVATED CLOTTING TIME: Activated Clotting Time: 174 seconds

## 2022-11-17 LAB — HEMOGLOBIN AND HEMATOCRIT, BLOOD
HCT: 34.2 % — ABNORMAL LOW (ref 36.0–46.0)
HCT: 30.7 % — ABNORMAL LOW (ref 36.0–46.0)
Hemoglobin: 11 g/dL — ABNORMAL LOW (ref 12.0–15.0)
Hemoglobin: 10.1 g/dL — ABNORMAL LOW (ref 12.0–15.0)

## 2022-11-17 LAB — PREPARE RBC (CROSSMATCH)

## 2022-11-17 SURGERY — IMPLANTATION, AORTIC VALVE, TRANSCATHETER, FEMORAL APPROACH
Anesthesia: Monitor Anesthesia Care | Laterality: Right

## 2022-11-17 MED ORDER — PROTAMINE SULFATE 10 MG/ML IV SOLN
INTRAVENOUS | Status: DC | PRN
  Administered 2022-11-17: 100 mg via INTRAVENOUS

## 2022-11-17 MED ORDER — CHLORHEXIDINE GLUCONATE 0.12 % MT SOLN
OROMUCOSAL | Status: AC
  Administered 2022-11-17: 15 mL
  Filled 2022-11-17: qty 15

## 2022-11-17 MED ORDER — FUROSEMIDE 10 MG/ML IJ SOLN
INTRAMUSCULAR | Status: AC
  Filled 2022-11-17: qty 4

## 2022-11-17 MED ORDER — POTASSIUM CHLORIDE CRYS ER 20 MEQ PO TBCR
20.0000 meq | EXTENDED_RELEASE_TABLET | Freq: Once | ORAL | Status: AC
  Administered 2022-11-17: 20 meq via ORAL

## 2022-11-17 MED ORDER — SODIUM CHLORIDE 0.9% FLUSH
3.0000 mL | Freq: Two times a day (BID) | INTRAVENOUS | Status: DC
  Administered 2022-11-17 – 2022-11-18 (×2): 3 mL via INTRAVENOUS

## 2022-11-17 MED ORDER — ASPIRIN 81 MG PO CHEW
81.0000 mg | CHEWABLE_TABLET | Freq: Every day | ORAL | Status: DC
  Administered 2022-11-18: 81 mg via ORAL
  Filled 2022-11-17: qty 1

## 2022-11-17 MED ORDER — EPHEDRINE SULFATE-NACL 50-0.9 MG/10ML-% IV SOSY
PREFILLED_SYRINGE | INTRAVENOUS | Status: DC | PRN
  Administered 2022-11-17: 5 mg via INTRAVENOUS

## 2022-11-17 MED ORDER — ATORVASTATIN CALCIUM 40 MG PO TABS
40.0000 mg | ORAL_TABLET | Freq: Every day | ORAL | Status: DC
  Administered 2022-11-17: 40 mg via ORAL
  Filled 2022-11-17: qty 1

## 2022-11-17 MED ORDER — LIDOCAINE HCL (PF) 1 % IJ SOLN
INTRAMUSCULAR | Status: DC | PRN
  Administered 2022-11-17 (×3): 10 mL

## 2022-11-17 MED ORDER — ONDANSETRON HCL 4 MG/2ML IJ SOLN: 4.0000 mg | Freq: Four times a day (QID) | INTRAMUSCULAR | Status: DC | PRN

## 2022-11-17 MED ORDER — FERROUS SULFATE 325 (65 FE) MG PO TABS
325.0000 mg | ORAL_TABLET | Freq: Two times a day (BID) | ORAL | Status: DC
  Administered 2022-11-18: 325 mg via ORAL
  Filled 2022-11-17: qty 1

## 2022-11-17 MED ORDER — TRAMADOL HCL 50 MG PO TABS: 50.0000 mg | ORAL_TABLET | Freq: Four times a day (QID) | ORAL | Status: DC | PRN

## 2022-11-17 MED ORDER — OXYCODONE HCL 5 MG PO TABS
5.0000 mg | ORAL_TABLET | ORAL | Status: DC | PRN
  Administered 2022-11-17 (×2): 5 mg via ORAL
  Filled 2022-11-17: qty 1

## 2022-11-17 MED ORDER — PHENYLEPHRINE 80 MCG/ML (10ML) SYRINGE FOR IV PUSH (FOR BLOOD PRESSURE SUPPORT)
PREFILLED_SYRINGE | INTRAVENOUS | Status: DC | PRN
  Administered 2022-11-17 (×2): 80 ug via INTRAVENOUS

## 2022-11-17 MED ORDER — ROPINIROLE HCL 0.25 MG PO TABS
0.2500 mg | ORAL_TABLET | Freq: Every day | ORAL | Status: DC
  Administered 2022-11-17: 0.25 mg via ORAL
  Filled 2022-11-17 (×2): qty 1

## 2022-11-17 MED ORDER — LACTATED RINGERS IV SOLN: INTRAVENOUS | Status: DC

## 2022-11-17 MED ORDER — TRAMADOL HCL 50 MG PO TABS: 50.0000 mg | ORAL_TABLET | ORAL | Status: DC | PRN

## 2022-11-17 MED ORDER — HEPARIN (PORCINE) IN NACL 1000-0.9 UT/500ML-% IV SOLN
INTRAVENOUS | Status: DC | PRN
  Administered 2022-11-17 (×3): 500 mL

## 2022-11-17 MED ORDER — LIDOCAINE HCL (PF) 1 % IJ SOLN
INTRAMUSCULAR | Status: AC
  Filled 2022-11-17: qty 30

## 2022-11-17 MED ORDER — SODIUM CHLORIDE 0.9% FLUSH: 3.0000 mL | INTRAVENOUS | Status: DC | PRN

## 2022-11-17 MED ORDER — SODIUM CHLORIDE 0.9% IV SOLUTION: Freq: Once | INTRAVENOUS | Status: DC

## 2022-11-17 MED ORDER — ACETAMINOPHEN 325 MG PO TABS
650.0000 mg | ORAL_TABLET | Freq: Four times a day (QID) | ORAL | Status: DC | PRN
  Administered 2022-11-18 (×2): 650 mg via ORAL
  Filled 2022-11-17 (×2): qty 2

## 2022-11-17 MED ORDER — PANTOPRAZOLE SODIUM 40 MG PO TBEC
40.0000 mg | DELAYED_RELEASE_TABLET | Freq: Every day | ORAL | Status: DC
  Administered 2022-11-17 – 2022-11-18 (×2): 40 mg via ORAL
  Filled 2022-11-17 (×2): qty 1

## 2022-11-17 MED ORDER — POTASSIUM CHLORIDE CRYS ER 20 MEQ PO TBCR
EXTENDED_RELEASE_TABLET | ORAL | Status: AC
  Filled 2022-11-17: qty 1

## 2022-11-17 MED ORDER — NITROGLYCERIN IN D5W 200-5 MCG/ML-% IV SOLN
0.0000 ug/min | INTRAVENOUS | Status: DC
  Administered 2022-11-17: 5 ug/min via INTRAVENOUS
  Filled 2022-11-17: qty 250

## 2022-11-17 MED ORDER — ALBUMIN HUMAN 5 % IV SOLN: INTRAVENOUS | Status: DC | PRN

## 2022-11-17 MED ORDER — IOHEXOL 350 MG/ML SOLN
INTRAVENOUS | Status: DC | PRN
  Administered 2022-11-17: 80 mL

## 2022-11-17 MED ORDER — OXYCODONE HCL 5 MG PO TABS
ORAL_TABLET | ORAL | Status: AC
  Filled 2022-11-17: qty 1

## 2022-11-17 MED ORDER — GABAPENTIN 300 MG PO CAPS
600.0000 mg | ORAL_CAPSULE | Freq: Every day | ORAL | Status: DC
  Administered 2022-11-17: 600 mg via ORAL
  Filled 2022-11-17: qty 2

## 2022-11-17 MED ORDER — HEPARIN SODIUM (PORCINE) 1000 UNIT/ML IJ SOLN
INTRAMUSCULAR | Status: DC | PRN
  Administered 2022-11-17: 9000 [IU] via INTRAVENOUS
  Administered 2022-11-17: 1000 [IU] via INTRAVENOUS

## 2022-11-17 MED ORDER — ACETAMINOPHEN 650 MG RE SUPP: 650.0000 mg | Freq: Four times a day (QID) | RECTAL | Status: DC | PRN

## 2022-11-17 MED ORDER — FUROSEMIDE 10 MG/ML IJ SOLN
20.0000 mg | Freq: Once | INTRAMUSCULAR | Status: AC
  Administered 2022-11-17: 20 mg via INTRAVENOUS

## 2022-11-17 MED ORDER — SODIUM CHLORIDE 0.9 % IV SOLN: INTRAVENOUS | Status: DC | PRN

## 2022-11-17 MED ORDER — MORPHINE SULFATE (PF) 2 MG/ML IV SOLN: 1.0000 mg | INTRAVENOUS | Status: DC | PRN

## 2022-11-17 MED ORDER — SODIUM CHLORIDE 0.9 % IV SOLN: 250.0000 mL | INTRAVENOUS | Status: DC | PRN

## 2022-11-17 MED ORDER — LACTATED RINGERS IV SOLN: INTRAVENOUS | Status: DC | PRN

## 2022-11-17 MED ORDER — DULOXETINE HCL 30 MG PO CPEP
30.0000 mg | ORAL_CAPSULE | Freq: Every day | ORAL | Status: DC
  Administered 2022-11-17 – 2022-11-18 (×2): 30 mg via ORAL
  Filled 2022-11-17 (×2): qty 1

## 2022-11-17 MED ORDER — CEFAZOLIN SODIUM-DEXTROSE 2-4 GM/100ML-% IV SOLN
2.0000 g | Freq: Two times a day (BID) | INTRAVENOUS | Status: AC
  Administered 2022-11-17 – 2022-11-18 (×2): 2 g via INTRAVENOUS
  Filled 2022-11-17 (×2): qty 100

## 2022-11-17 MED ORDER — SODIUM CHLORIDE 0.9 % IV SOLN: INTRAVENOUS | Status: AC

## 2022-11-17 MED ORDER — PROPOFOL 10 MG/ML IV BOLUS
INTRAVENOUS | Status: DC | PRN
  Administered 2022-11-17 (×2): 20 mg via INTRAVENOUS

## 2022-11-17 SURGICAL SUPPLY — 29 items
BAG SNAP BAND KOVER 36X36 (MISCELLANEOUS) ×4 IMPLANT
CABLE ADAPT PACING TEMP 12FT (ADAPTER) IMPLANT
CATH 23 ULTRA DELIVERY (CATHETERS) IMPLANT
CATH DIAG 6FR PIGTAIL ANGLED (CATHETERS) IMPLANT
CATH INFINITI 5FR ANG PIGTAIL (CATHETERS) IMPLANT
CATH INFINITI 6F AL1 (CATHETERS) IMPLANT
CLOSURE PERCLOSE PROSTYLE (VASCULAR PRODUCTS) IMPLANT
CRIMPER (MISCELLANEOUS) IMPLANT
DEVICE INFLATION ATRION QL2530 (MISCELLANEOUS) IMPLANT
ELECT DEFIB PAD ADLT CADENCE (PAD) IMPLANT
KIT HEART LEFT (KITS) ×2 IMPLANT
KIT SAPIAN 3 ULTRA RESILIA 23 (Valve) IMPLANT
PACK CARDIAC CATHETERIZATION (CUSTOM PROCEDURE TRAY) ×2 IMPLANT
SHEATH INTRODUCER SET 20-26 (SHEATH) IMPLANT
SHEATH PINNACLE 6F 10CM (SHEATH) IMPLANT
SHEATH PINNACLE 8F 10CM (SHEATH) IMPLANT
SHEATH PROBE COVER 6X72 (BAG) IMPLANT
SLEEVE REPOSITIONING LENGTH 30 (MISCELLANEOUS) IMPLANT
STOPCOCK MORSE 400PSI 3WAY (MISCELLANEOUS) ×4 IMPLANT
TRANSDUCER W/STOPCOCK (MISCELLANEOUS) ×4 IMPLANT
TUBING CIL FLEX 10 FLL-RA (TUBING) IMPLANT
TUBING CONTRAST HIGH PRESS 48 (TUBING) IMPLANT
WIRE AMPLATZ SS-J .035X180CM (WIRE) IMPLANT
WIRE EMERALD 3MM-J .035X150CM (WIRE) IMPLANT
WIRE EMERALD 3MM-J .035X260CM (WIRE) IMPLANT
WIRE EMERALD ST .035X260CM (WIRE) IMPLANT
WIRE MICRO SET SILHO 5FR 7 (SHEATH) IMPLANT
WIRE PACING TEMP ST TIP 5 (CATHETERS) IMPLANT
WIRE SAFARI SM CURVE 275 (WIRE) IMPLANT

## 2022-11-17 NOTE — Discharge Instructions (Signed)

## 2022-11-17 NOTE — Plan of Care (Signed)
POC initiated and progressing. 

## 2022-11-17 NOTE — Progress Notes (Signed)
Katie T., PA made aware of H&H results, orders obtained, made aware lasix and potassium meds have not been given yet, informed of BP and HR, safety maintained

## 2022-11-17 NOTE — Progress Notes (Signed)
SITE AREA: right groin a-line  SITE PRIOR TO REMOVAL:  LEVEL 0  PRESSURE APPLIED FOR: approximately 20 minutes  MANUAL: yes  PATIENT STATUS DURING PULL: stable  POST PULL SITE:  LEVEL 0  POST PULL INSTRUCTIONS GIVEN: yes, br x 4 hours, no bath tubs, pools, hot tubs, drsgs in place to groin x 24hrs, no running, jumping, take it easy for next few days  POST PULL PULSES PRESENT: bilateral pedal pulses at +1  DRESSING APPLIED: gauze with tegaderm   BEDREST BEGINS @ 1518  COMMENTS:

## 2022-11-17 NOTE — Progress Notes (Signed)
Per dr R. Fitzerald ok to use left arm to start IV.

## 2022-11-17 NOTE — Progress Notes (Signed)
Patient arrived to the floor via the stretcher alert and oriented. IV fluid running patient oriented to staff and equipment, cardiac monitoring initiated, Dr. Lynnette Caffey rounded and ask to keep the dressing on and advise to wait for the H&H result before starting the second unit of blood.

## 2022-11-17 NOTE — Progress Notes (Signed)
Purewick placed by Debbie, RN, tolerated well, safety maintained 

## 2022-11-17 NOTE — Anesthesia Preprocedure Evaluation (Signed)
Anesthesia Evaluation  Patient identified by MRN, date of birth, ID band Patient awake    Reviewed: Allergy & Precautions, NPO status , Patient's Chart, lab work & pertinent test results  Airway Mallampati: II  TM Distance: >3 FB Neck ROM: Full    Dental  (+) Dental Advisory Given   Pulmonary asthma    breath sounds clear to auscultation       Cardiovascular hypertension, Pt. on medications + Peripheral Vascular Disease and +CHF  + Valvular Problems/Murmurs (Mod MS, Severe AS) AS  Rhythm:Regular Rate:Normal  MPRESSIONS     1. Normal LV function; calcified aortic valve with moderate to severe AS  (mean gradient 31 mmHg; DI 0.24); severe MAC with moderate MS (mean  gradient 9 mmHg).   2. Left ventricular ejection fraction, by estimation, is 60 to 65%. The  left ventricle has normal function. The left ventricle has no regional  wall motion abnormalities. There is moderate left ventricular hypertrophy.  Left ventricular diastolic function   could not be evaluated.   3. Right ventricular systolic function is normal. The right ventricular  size is normal. Tricuspid regurgitation signal is inadequate for assessing  PA pressure.   4. Left atrial size was severely dilated.   5. The mitral valve is normal in structure. Mild mitral valve  regurgitation. Moderate mitral stenosis. Severe mitral annular  calcification.   6. The aortic valve is calcified. Aortic valve regurgitation is not  visualized. Moderate to severe aortic valve stenosis.   7. The inferior vena cava is normal in size with greater than 50%  respiratory variability, suggesting right atrial pressure of 3 mmHg.      Neuro/Psych negative neurological ROS     GI/Hepatic Neg liver ROS, hiatal hernia, PUD,GERD  ,,  Endo/Other  negative endocrine ROS    Renal/GU Renal disease     Musculoskeletal  (+) Arthritis ,    Abdominal   Peds  Hematology  (+) Blood  dyscrasia, anemia   Anesthesia Other Findings   Reproductive/Obstetrics                              Lab Results  Component Value Date   WBC 10.1 11/13/2022   HGB 9.6 (L) 11/13/2022   HCT 30.7 (L) 11/13/2022   MCV 92.5 11/13/2022   PLT 207 11/13/2022   Lab Results  Component Value Date   CREATININE 0.99 11/13/2022   BUN 22 11/13/2022   NA 139 11/13/2022   K 3.9 11/13/2022   CL 104 11/13/2022   CO2 25 11/13/2022    Anesthesia Physical Anesthesia Plan  ASA: 4  Anesthesia Plan: MAC   Post-op Pain Management: Minimal or no pain anticipated and Precedex   Induction:   PONV Risk Score and Plan: 2 and Ondansetron and Treatment may vary due to age or medical condition  Airway Management Planned: Natural Airway and Simple Face Mask  Additional Equipment: Arterial line  Intra-op Plan:   Post-operative Plan:   Informed Consent: I have reviewed the patients History and Physical, chart, labs and discussed the procedure including the risks, benefits and alternatives for the proposed anesthesia with the patient or authorized representative who has indicated his/her understanding and acceptance.       Plan Discussed with:   Anesthesia Plan Comments:          Anesthesia Quick Evaluation

## 2022-11-17 NOTE — Progress Notes (Addendum)
Stat CBC came back with Hg at 5.4. Going to give her 2U PRBCs Lasix 20mg  IV already ordered. BP 123/59, HR 63. Groins look good. No back pain. Will follow closely with low threshold to order CT. Recheck H/H after transfusion later this afternoon.   Cline Crock PA-C  [facility]

## 2022-11-17 NOTE — Op Note (Signed)
[facility]  TAVR OPERATIVE NOTE   Date of Procedure:  11/17/2022  Preoperative Diagnosis: Severe Aortic Stenosis   Postoperative Diagnosis: Same   Procedure:   Transcatheter Aortic Valve Replacement - Transfemoral Approach  Edwards Sapien 3 Ultra THV (size 23 mm, model # [id number])    Co-Surgeons:  Eugenio Hoes, MD and Alverda Skeans, MD Anesthesiologist:  Marcene Duos, MD  Echocardiographer:  Charlton Haws, MD  Pre-operative Echo Findings: Severe aortic stenosis Normal left ventricular systolic function  Post-operative Echo Findings: No paravalvular leak Normal left ventricular systolic function  Left Heart Catheterization Findings: Left ventricular end-diastolic pressure of   BRIEF CLINICAL NOTE AND INDICATIONS FOR SURGERY  The patient is an 89 year old female with a history of moderate to severe symptomatic aortic stenosis, moderate to severe mitral stenosis due to mitral annular calcification, hypertension, CKD stage III, iron deficiency anemia, invasive ductal carcinoma status postchemotherapy and radiation and frailty who is referred for elective transcatheter aortic valve replacement with 23 mm SAPIEN 3 valve from the left transfemoral approach.  During the course of the patient's preoperative work up they have been evaluated comprehensively by a multidisciplinary team of specialists coordinated through the Multidisciplinary [facility]ve Clinic in the [facility].  They have been demonstrated to suffer from symptomatic severe aortic stenosis as noted above. The patient has been counseled extensively as to the relative risks and benefits of all options for the treatment of severe aortic stenosis including long term medical therapy, conventional surgery for aortic valve replacement, and transcatheter aortic valve replacement.  The patient has been independently evaluated by Dr. Leafy Ro with CT surgery and they are felt to be at  high risk for conventional surgical aortic valve replacement. The surgeon indicated the patient would be a poor candidate for conventional surgery. Based upon review of all of the patient's preoperative diagnostic tests they are felt to be candidate for transcatheter aortic valve replacement using the transfemoral approach as an alternative to high risk conventional surgery.    Following the decision to proceed with transcatheter aortic valve replacement, a discussion has been held regarding what types of management strategies would be attempted intraoperatively in the event of life-threatening complications, including whether or not the patient would be considered a candidate for the use of cardiopulmonary bypass and/or conversion to open sternotomy for attempted surgical intervention.  The patient has been advised of a variety of complications that might develop peculiar to this approach including but not limited to risks of death, stroke, paravalvular leak, aortic dissection or other major vascular complications, aortic annulus rupture, device embolization, cardiac rupture or perforation, acute myocardial infarction, arrhythmia, heart block or bradycardia requiring permanent pacemaker placement, congestive heart failure, respiratory failure, renal failure, pneumonia, infection, other late complications related to structural valve deterioration or migration, or other complications that might ultimately cause a temporary or permanent loss of functional independence or other long term morbidity.  The patient provides full informed consent for the procedure as described and all questions were answered preoperatively.    DETAILS OF THE OPERATIVE PROCEDURE  PREPARATION:   The patient is brought to the operating room on the above mentioned date and central monitoring was established by the anesthesia team including placement of a radial arterial line. The patient is placed in the supine position on the  operating table.  Intravenous antibiotics are administered. Conscious sedation is used.   Baseline transthoracic echocardiogram was performed. The patient's chest, abdomen, both groins, and both lower extremities  are prepared and draped in a sterile manner. A time out procedure is performed.   PERIPHERAL ACCESS:   Using the modified Seldinger technique, femoral arterial and internal jugular venous access were obtained with placement of a 6 Fr sheath in the right common femoral artery and a 7 Fr sheath in the right internal jugular vein using u/s guidance.  A pigtail diagnostic catheter was passed through the femoral arterial sheath under fluoroscopic guidance into the aortic root.  A temporary transvenous pacemaker catheter was passed through the internal jugular venous sheath under fluoroscopic guidance into the right ventricle.  The pacemaker was tested to ensure stable lead placement and pacemaker capture. Aortic root angiography was performed in order to determine the optimal angiographic angle for valve deployment.  TRANSFEMORAL ACCESS:  A micropuncture kit was used to gain access to the left femoral artery using u/s guidance. Position confirmed with angiography. Pre-closure with double ProGlide closure devices. The patient was heparinized systemically and ACT verified > 250 seconds.    A 14 Fr transfemoral E-sheath was introduced into the left femoral artery after progressively dilating over an Amplatz superstiff wire. An AL-1 catheter was used to direct a straight-tip exchange length wire across the native aortic valve into the left ventricle. This was exchanged out for a pigtail catheter and position was confirmed in the LV apex. Simultaneous left ventricular, aortic, and left ventricular end-diastolic pressures were recorded.  The pigtail catheter was then exchanged for an Safari wire in the LV apex.    TRANSCATHETER HEART VALVE DEPLOYMENT:  An Edwards Sapien 3 THV (size 23 mm) was prepared  and crimped per manufacturer's guidelines, and the proper orientation of the valve is confirmed on the [employer] delivery system. The valve was advanced through the introducer sheath using normal technique until in an appropriate position in the abdominal aorta beyond the sheath tip. The balloon was then retracted and using the fine-tuning wheel was centered on the valve. The valve was then advanced across the aortic arch using appropriate flexion of the catheter. The valve was carefully positioned across the aortic valve annulus. The Commander catheter was retracted using normal technique. Once final position of the valve has been confirmed by angiographic assessment, the valve is deployed while temporarily holding ventilation and during rapid ventricular pacing to maintain systolic blood pressure < 50 mmHg and pulse pressure < 10 mmHg. The balloon inflation is held for >3 seconds after reaching full deployment volume. Once the balloon has fully deflated the balloon is retracted into the ascending aorta and valve function is assessed using TTE. There is felt to be no paravalvular leak and no central aortic insufficiency.  The patient's hemodynamic recovery following valve deployment is good.  The deployment balloon and guidewire are both removed. Echo demostrated acceptable post-procedural gradients, stable mitral valve function, and no AI.   PROCEDURE COMPLETION:  The sheath was then removed and closure devices were completed. Protamine was administered once femoral arterial repair was complete. The temporary pacemaker, pigtail catheters and femoral sheaths were removed and manual pressure used for venous hemostasis.  The right common femoral sheath was secured in place as the patient was on low dose norepinephrine.  The patient tolerated the procedure well and is transported to the surgical intensive care in stable condition. There were no immediate intraoperative complications. All sponge instrument  and needle counts are verified correct at completion of the operation.   No blood products were administered during the operation.  The patient received a total of  80 mL of intravenous contrast during the procedure.  Orbie Pyo MD 11/17/2022 1:05 PM

## 2022-11-17 NOTE — Transfer of Care (Signed)
Immediate Anesthesia Transfer of Care Note  Patient: Brandy [name]  Procedure(s) Performed: Transcatheter Aortic Valve Replacement, Transfemoral (Right) INTRAOPERATIVE TRANSTHORACIC ECHOCARDIOGRAM  Patient Location: PACU  Anesthesia Type:MAC  Level of Consciousness: drowsy and patient cooperative  Airway & Oxygen Therapy: Patient Spontanous Breathing and Patient connected to nasal cannula oxygen  Post-op Assessment: Report given to RN, Post -op Vital signs reviewed and stable, and Patient moving all extremities X 4  Post vital signs: Reviewed and stable  Last Vitals:  Vitals Value Taken Time  BP 92/44   Temp    Pulse 63 11/17/22 1303  Resp 20 11/17/22 1302  SpO2 92 % 11/17/22 1303  Vitals shown include unvalidated device data.  Last Pain:  Vitals:   11/17/22 0927  TempSrc:   PainSc: 0-No pain         Complications: Discussed HOTN with Dr. Sampson Goon and Dr. Lynnette Caffey. Treating with levophed and albumin.

## 2022-11-17 NOTE — Discharge Summary (Incomplete)
HEART AND VASCULAR CENTER   MULTIDISCIPLINARY HEART VALVE TEAM  Discharge Summary    Patient ID: Brandy [name] MRN: [id number]; DOB: 09-06-1932  Admit date: 11/17/2022 Discharge date: 11/18/2022  Primary Care Provider: Sigmund Hazel, MD  Primary Cardiologist: Dr. Lynnette Caffey   Discharge Diagnoses    Principal Problem:   S/P TAVR (transcatheter aortic valve replacement) Active Problems:   Gastroesophageal reflux disease without esophagitis   Hyperlipidemia   Essential hypertension   Arthritis   Aortic valve stenosis   Protein-calorie malnutrition, severe (HCC)   Carpal tunnel syndrome   Asthma   Degenerative arthritis of lumbar spine   History of breast cancer   Cholangitis due to bile duct calculus with obstruction   Anemia of chronic disease   Allergies No Known Allergies  Diagnostic Studies/Procedures    HEART AND VASCULAR CENTER  TAVR OPERATIVE NOTE     Date of Procedure:                11/17/2022   Preoperative Diagnosis:      Severe Aortic Stenosis    Postoperative Diagnosis:    Same    Procedure:        Transcatheter Aortic Valve Replacement - Transfemoral Approach             Edwards Sapien 3 Ultra THV (size 23 mm, model # 9755RSL)               Co-Surgeons:                        Eugenio Hoes, MD and Alverda Skeans, MD Anesthesiologist:                  Marcene Duos, MD   Echocardiographer:              Charlton Haws, MD   Pre-operative Echo Findings: Severe aortic stenosis Normal left ventricular systolic function   Post-operative Echo Findings: No paravalvular leak Normal left ventricular systolic function   Left Heart Catheterization Findings: Left ventricular end-diastolic pressure of _____________    Echo 11/18/22: completed but pending formal read at the time of discharge   History of Present Illness     Brandy [name] is a 89 y.o. 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   The patient is well-known to the structural heart clinic and was referred for TAVR in 05/2021. She underwent [facility] 05/2021 which showed no CAD.  She ultimately declined intervention at that time. In 02/2022, the patient developed acute cholecystitis with hepatic bile duct dilatation and was treated with antibiotics. Laparoscopic cholecystectomy was deferred at that time given severe AS.  She returned to the structural heart clinic and was more interested in proceeding with TAVR in order to have her GI surgery. Echo 02/08/22 showed EF 60%, mod LVH, and severe AS with a mean grad 31 mmHg, peak grad 53.9 mmHg, AVA 0.76, DVI 0.24, severe MAC with moderate MS (mean gradient 9 mmHg) and mild MR. She was then readmitted in 03/2022 with mechanical fall and sacral/pubic ramus fracture and then again in 07/2022 for a fall  resulting in a neck fracture.  She was seen back in our clinic and plans were made for continued surveillance and recuperation from orthopedic injuries. She then presented to the ER on 10/24/22 for evaluation of chest pain. HStrop flat and ECG without acute ischemia. Chest pain was felt to be related to severe AS and she was referred back to [place]. She was seen in the office by Dr. Lynnette Caffey on 10/28/22 and reported living at an independent living facility and doing generally well, albeit with a low functional capacity. Mostly wheelchair bound but able to walk short distances. Given worsening DOE with transfers and chest pain it was decided to proceed with TAVR.    The patient was evaluated by the multidisciplinary valve team and felt to have severe, symptomatic aortic  stenosis and to be a suitable candidate for TAVR, which was set up for 11/17/22.   Hospital Course     Consultants: none   Severe AS: s/p successful TAVR with a 23 mm Edwards Sapien 3 Ultra Resilia THV via the TF approach on 11/17/22. Post operative echo completed but pending formal read. Groin sites are stable. ECG with sinus and no high grade heart block. Started on a baby Asprin 81 mg daily. Plan for discharge home today with close follow up in the outpatient setting.   Acute post op anemia: Hg dropped to 5.4. She was treated with 1U PRBCs and Hg improved to 10.7. She has had no evidence of acute bleeding and likely related to blood loss during case and hemodilution. Will recheck CBC next week in the office.   Acute on chronic diastolic CHF: as evidenced by an elevated LVEDP at the time of TAVR. Treated with one dose if IV lasix 20mg . Resumed on home Diovan-HCT at discharge.   Leukocytosis: WBC up to 14.6 today. No s/s infection. Afebrile. Likely reactive.   MAC with mitral stenosis: continue to follow on serial echos  CKD stage IIIa: creat will mild bump, 0.9--> 1.10. Likely 2/2 acute blood loss. Will recheck labs next week in the office.   Protein calorie malnutrition: albumin 3.2. Treated with albumin   Multiple falls: she mostly stays in her wheelchair. Fall precautions.   HTN: BP has been well controlled. Resume home regimen.  _____________  Discharge Vitals Blood pressure (!) 126/54, pulse 82, temperature 98 F (36.7 C), temperature source Oral, resp. rate 20, height 5' (1.524 m), weight 65.3 kg, SpO2 95 %.  Filed Weights   11/17/22 0843 11/18/22 0401  Weight: 61.2 kg 65.3 kg   GEN: elderly, frail, white female HEENT: normal Neck: no JVD or masses Cardiac: RRR; no murmurs, rubs, or gallops,no edema  Respiratory:  clear to auscultation bilaterally, normal work of breathing GI: soft, nontender, nondistended, + BS MS: no deformity or atrophy Skin: warm and dry, no  rash.  Groin sites clear without hematoma or ecchymosis  Neuro:  Alert and Oriented x 3, Strength and sensation are intact Psych: euthymic mood, full affect   Labs & Radiologic Studies    CBC Recent Labs    11/17/22 1408 11/17/22 1840 11/17/22 2154 11/18/22 0153  WBC 7.2  --   --  14.6*  HGB 5.4*   < > 10.1* 10.7*  HCT 17.9*   < > 30.7* 32.8*  MCV 95.7  --   --  89.4  PLT 96*  --   --  129*   < > = values in this interval not displayed.   Basic Metabolic Panel Recent Labs  11/17/22 1354 11/18/22 0153  NA 141 137  K 4.0 4.1  CL 103 102  CO2  --  23  GLUCOSE 127* 167*  BUN 20 22  CREATININE 0.90 1.10*  CALCIUM  --  8.7*  MG  --  1.4*   Liver Function Tests No results for input(s): "AST", "ALT", "ALKPHOS", "BILITOT", "PROT", "ALBUMIN" in the last 72 hours. No results for input(s): "LIPASE", "AMYLASE" in the last 72 hours. Cardiac Enzymes No results for input(s): "CKTOTAL", "CKMB", "CKMBINDEX", "TROPONINI" in the last 72 hours. BNP Invalid input(s): "POCBNP" D-Dimer No results for input(s): "DDIMER" in the last 72 hours. Hemoglobin A1C No results for input(s): "HGBA1C" in the last 72 hours. Fasting Lipid Panel No results for input(s): "CHOL", "HDL", "LDLCALC", "TRIG", "CHOLHDL", "LDLDIRECT" in the last 72 hours. Thyroid Function Tests No results for input(s): "TSH", "T4TOTAL", "T3FREE", "THYROIDAB" in the last 72 hours.  Invalid input(s): "FREET3" _____________  Structural Heart Procedure  Result Date: 11/17/2022 See surgical note for result.  ECHOCARDIOGRAM LIMITED  Result Date: 11/17/2022    ECHOCARDIOGRAM LIMITED REPORT   Patient Name:   Brandy [name] [facility] Date of Exam: 11/17/2022 Medical Rec #:  [id number]       Height:       60.0 in Accession #:    [id number]      Weight:       149.0 lb Date of Birth:  12-Jan-1933        BSA:          1.647 m Patient Age:    89 years        BP:           155/63 mmHg Patient Gender: F               HR:           78 bpm. Exam  Location:  Inpatient Procedure: Limited Echo, Color Doppler and Cardiac Doppler Indications:     Aortic Stenosis i35.0  History:         Patient has prior history of Echocardiogram examinations, most                  recent 02/08/2022. CHF; Risk Factors:Hypertension and                  Dyslipidemia.  Sonographer:     Irving Burton Senior RDCS Referring Phys:  [id number] Orbie Pyo Diagnosing Phys: Charlton Haws MD  Sonographer Comments: 23mm Edwards [phone] TAVR Implanted IMPRESSIONS  1. Left ventricular ejection fraction, by estimation, is 55 to 60%. The left ventricle has normal function. There is mild left ventricular hypertrophy.  2. Right ventricular systolic function is normal. The right ventricular size is normal.  3. The mitral valve is degenerative. Moderate mitral stenosis. Severe mitral annular calcification.  4. Pre TAVR tri leaflet AV severely calcified with restricted motion mean gradient 37 peak 52 mmHg AVA 0.9 cm2         Post TAVR Well placed 23 mm Sapien 3 valve no PVL mean gradient 6 peak 11 mmHg AVA 1.6 cm2 . The aortic valve is tricuspid. There is severe calcifcation of the aortic valve. There is severe thickening of the aortic valve. Severe aortic valve stenosis. FINDINGS  Left Ventricle: Left ventricular ejection fraction, by estimation, is 55 to 60%. The left ventricle has normal function. The left ventricular internal cavity size was normal in size. There is mild left ventricular hypertrophy. Right Ventricle: The right ventricular size is normal. Right  ventricular systolic function is normal. Pericardium: There is no evidence of pericardial effusion. Mitral Valve: The mitral valve is degenerative in appearance. There is moderate thickening of the mitral valve leaflet(s). There is moderate calcification of the mitral valve leaflet(s). Severe mitral annular calcification. Moderate mitral valve stenosis. MV peak gradient, 19.4 mmHg. The mean mitral valve gradient is 10.0 mmHg. Aortic Valve: Pre TAVR tri  leaflet AV severely calcified with restricted motion mean gradient 37 peak 52 mmHg AVA 0.9 cm2 Post TAVR Well placed 23 mm Sapien 3 valve no PVL mean gradient 6 peak 11 mmHg AVA 1.6 cm2. The aortic valve is tricuspid. There is severe calcifcation of the aortic valve. There is severe thickening of the aortic valve. Severe aortic stenosis is present. Aortic valve mean gradient measures 26.7 mmHg. Aortic valve peak gradient measures 34.7 mmHg. Aortic valve area, by VTI measures 1.61 cm. Additional Comments: Spectral Doppler performed. Color Doppler performed.  LEFT VENTRICLE PLAX 2D LVOT diam:     1.90 cm LV SV:         117 LV SV Index:   71 LVOT Area:     2.84 cm  AORTIC VALVE AV Area (Vmax):    1.49 cm AV Area (Vmean):   1.42 cm AV Area (VTI):     1.61 cm AV Vmax:           294.67 cm/s AV Vmean:          237.000 cm/s AV VTI:            0.729 m AV Peak Grad:      34.7 mmHg AV Mean Grad:      26.7 mmHg LVOT Vmax:         155.00 cm/s LVOT Vmean:        119.000 cm/s LVOT VTI:          0.414 m LVOT/AV VTI ratio: 0.57 MITRAL VALVE MV Area VTI:  1.72 cm    SHUNTS MV Peak grad: 19.4 mmHg   Systemic VTI:  0.41 m MV Mean grad: 10.0 mmHg   Systemic Diam: 1.90 cm MV Vmax:      2.20 m/s MV Vmean:     155.0 cm/s Charlton Haws MD Electronically signed by Charlton Haws MD Signature Date/Time: 11/17/2022/12:45:07 PM    Final    DG Chest 2 View  Result Date: 11/13/2022 CLINICAL DATA:  Severe aortic stenosis. Preop exam. EXAM: CHEST - 2 VIEW COMPARISON:  Chest radiograph 02/07/2022, additional chest radiographs reviewed FINDINGS: Heart size upper normal. Unchanged mediastinal contours. Aortic atherosclerosis. No pulmonary edema, pleural effusion, or pneumothorax. There is no focal airspace disease. Chronic right shoulder arthropathy. Right humeral arthroplasty which appears to be superiorly displaced on the current exam, intermittently seen based on positioning. IMPRESSION: 1. No acute chest findings. 2.  Aortic Atherosclerosis  (ICD10-I70.0). Electronically Signed   By: Narda Rutherford M.D.   On: 11/13/2022 18:12   Disposition   Pt is being discharged home today in good condition.  Follow-up Plans & Appointments     Follow-up Information     Janetta Hora, PA-C. Go on 11/27/2022.   Specialties: Cardiology, Radiology Why: @ 10:15am. Please arrive at least 10 minutes early. Contact information: [address] [place] [address] [phone]                 Discharge Medications   Allergies as of 11/18/2022   No Known Allergies      Medication List  TAKE these medications    acetaminophen 325 MG tablet Commonly known as: TYLENOL Take 650 mg by mouth every 6 (six) hours as needed for moderate pain.   alendronate 70 MG tablet Commonly known as: FOSAMAX Take 70 mg by mouth every Sunday.   aspirin 81 MG chewable tablet Chew 1 tablet (81 mg total) by mouth daily. Okay to swallow whole Start taking on: Nov 19, 2022   atorvastatin 40 MG tablet Commonly known as: LIPITOR Take 40 mg by mouth at bedtime.   Biofreeze 4 % Gel Generic drug: Menthol (Topical Analgesic) Apply 1 application  topically every 8 (eight) hours as needed (pain).   cyanocobalamin 1000 MCG tablet Commonly known as: VITAMIN B12 Take 1,000 mcg by mouth See admin instructions. Take 1000 mcg in the evening every 3 days   DULoxetine 30 MG capsule Commonly known as: CYMBALTA Take 30 mg by mouth daily.   ferrous sulfate 325 (65 FE) MG tablet Take 1 tablet (325 mg total) by mouth daily. What changed: when to take this   gabapentin 300 MG capsule Commonly known as: NEURONTIN Take 2 capsules (600 mg total) by mouth at bedtime.   multivitamin with minerals Tabs tablet Take 1 tablet by mouth in the morning.   omeprazole 40 MG capsule Commonly known as: PRILOSEC Take 1 capsule (40 mg total) by mouth daily before breakfast.   rOPINIRole 0.25 MG tablet Commonly known as: REQUIP Take 0.25  mg by mouth at bedtime.   traMADol 50 MG tablet Commonly known as: ULTRAM Take 1 tablet (50 mg total) by mouth 4 (four) times daily as needed for moderate pain (pain.). What changed: when to take this   valsartan-hydrochlorothiazide 80-12.5 MG tablet Commonly known as: DIOVAN-HCT Take 1 tablet by mouth in the morning.          Outstanding Labs/Studies   BMET, CBC  Duration of Discharge Encounter   Greater than 30 minutes including physician time.  Signed, Cline Crock, PA-C 11/18/2022, 9:27 AM [phone]   ATTENDING ATTESTATION:  After conducting a review of all available clinical information with the care team, interviewing the patient, and performing a physical exam, I agree with the findings and plan described in this note.   GEN: No acute distress.   HEENT:  MMM, no JVD, no scleral icterus; RIJ dressing in place Cardiac: RRR, no murmurs, rubs, or gallops.  Respiratory: Clear to auscultation bilaterally. GI: Soft, nontender, non-distended  MS: No edema; No deformity. Neuro:  Nonfocal  Vasc:  +2 radial pulses; access sites stable  Patient is doing well after TAVR.  She did suffer from procedural blood loss and required 1 unit of packed red blood cells with an appropriate increase in her hemoglobin.  Her access sites are stable, her conduction is intact, and she has had no signs or symptoms of stroke.  Will discharge today with close hospital follow-up.   Alverda Skeans, MD Pager [phone]

## 2022-11-17 NOTE — Progress Notes (Addendum)
  HEART AND VASCULAR CENTER   MULTIDISCIPLINARY HEART VALVE TEAM  Patient doing well s/p TAVR. She is hemodynamically stable but BP was soft. Now off levophed and BP up to 114/45. Treated with of albumin. HR 66. Groin sites stable. ECG with no high grade block. It does show some mild ST elevation in anterior leads. She does not have any chest pain. Previous cath showed no CAD and coronary heights were high on pre TAVR CTs. Doubt any ACS. I stat labs showed HG down to 5.8. I repeated and got 6.8. Will send off stat CBC. Likely 2/2 blood loss during case. Will continue to monitor and transfuse if necessary. Plan to just wait for CBC for now. LVEDP was elevated at the time of TAVR. Will give IV lasix 20mg .    Cline Crock PA-C  [facility]  Pager [phone]

## 2022-11-17 NOTE — Progress Notes (Signed)
Groin check completed, left groin drsg saturated with reddish drainage, old drsg removed, clot noted, manual pressure applied for approximately 5 minutes, new gauze with tegaderm applied, bruising noted around left groin area, safety maintained, BR to restart at 1625

## 2022-11-17 NOTE — Progress Notes (Signed)
BP stable. Received 1U PRBCs and 20mg  IV lasix. Plan to check H/H now and transfuse second unit if Hg <7.   Cline Crock PA-C  [facility]

## 2022-11-17 NOTE — Op Note (Signed)
HEART AND VASCULAR CENTER   MULTIDISCIPLINARY HEART VALVE TEAM   TAVR OPERATIVE NOTE   Date of Procedure:  11/17/2022  Preoperative Diagnosis: Severe Aortic Stenosis   Postoperative Diagnosis: Same   Procedure:   Transcatheter Aortic Valve Replacement - Percutaneous left Transfemoral Approach  Edwards Sapien 3 Ultra THV (size 23 mm, model # [id number])   Co-Surgeons:  Eugenio Hoes MD and Alverda Skeans, MD   Anesthesiologist:  Dr Sampson Goon  Echocardiographer:  Dr Eden Emms  Pre-operative Echo Findings: Severe aortic stenosis Normal left ventricular systolic function  Post-operative Echo Findings: No paravalvular leak Normal left ventricular systolic function   BRIEF CLINICAL NOTE AND INDICATIONS FOR SURGERY ery frail 89yo female with NYHA class 3 symptoms of severe AS with moderated MS and normal LV function. Pt with indication for AVR and with her age, comorbidities and frailty would only be a TAVR candidate. Her improvement in symptoms will have to be assessed after procedure.      DETAILS OF THE OPERATIVE PROCEDURE  PREPARATION:    The patient was brought to the operating room on the above mentioned date and appropriate monitoring was established by the anesthesia team. The patient was placed in the supine position on the operating table.  Intravenous antibiotics were administered. The patient was monitored closely throughout the procedure under conscious sedation.    Baseline transthoracic echocardiogram was performed. The patient's abdomen and both groins were prepped and draped in a sterile manner. A time out procedure was performed.   PERIPHERAL ACCESS:    Using the modified Seldinger technique, femoral arterial  access was obtained with placement of 6 Fr sheath on the Right side.  A pigtail diagnostic catheter was passed through the right arterial sheath under fluoroscopic guidance into the aortic root.  A temporary transvenous pacemaker catheter was passed  through the right jugular venous sheath under fluoroscopic guidance into the right ventricle.  The pacemaker was tested to ensure stable lead placement and pacemaker capture. Aortic root angiography was performed in order to determine the optimal angiographic angle for valve deployment.   TRANSFEMORAL ACCESS:   Percutaneous transfemoral access and sheath placement was performed using ultrasound guidance.  The left common femoral artery was cannulated using a micropuncture needle and appropriate location was verified using hand injection angiogram.  A pair of Abbott Perclose percutaneous closure devices were placed and a 6 French sheath replaced into the femoral artery.  The patient was heparinized systemically and ACT verified > 250 seconds.    A 14 Fr transfemoral E-sheath was introduced into the left common femoral artery after progressively dilating over an Amplatz superstiff wire. An AL1 catheter was used to direct a straight-tip exchange length wire across the native aortic valve into the left ventricle. This was exchanged out for a pigtail catheter and position was confirmed in the LV apex. Simultaneous LV and Ao pressures were recorded and measured . The pigtail catheter was exchanged for a Safari wire in the LV apex.   BALLOON AORTIC VALVULOPLASTY:   Balloon aortic valvuloplasty was not performed.  TRANSCATHETER HEART VALVE DEPLOYMENT:   An Edwards Sapien 3 Ultra transcatheter heart valve (size 23 mm) was prepared and crimped per manufacturer's guidelines, and the proper orientation of the valve is confirmed on the [employer] delivery system. The valve was advanced through the introducer sheath using normal technique until in an appropriate position in the abdominal aorta beyond the sheath tip. The balloon was then retracted and using the fine-tuning wheel was centered on the valve.  The valve was then advanced across the aortic arch using appropriate flexion of the catheter. The  valve was carefully positioned across the aortic valve annulus. The Commander catheter was retracted using normal technique. Once final position of the valve has been confirmed by angiographic assessment, the valve is deployed during rapid ventricular pacing to maintain systolic blood pressure < 50 mmHg and pulse pressure < 10 mmHg. The balloon inflation is held for >3 seconds after reaching full deployment volume. Once the balloon has fully deflated the balloon is retracted into the ascending aorta and valve function is assessed using echocardiography. There is felt to be no paravalvular leak and no central aortic insufficiency.  The patient's hemodynamic recovery following valve deployment is good.  The deployment balloon and guidewire are both removed.    PROCEDURE COMPLETION:   The sheath was removed and femoral artery closure performed.  Protamine was administered once femoral arterial repair was complete. The temporary pacemaker, pigtail catheter and femoral sheaths were removed with manual pressure used for venous hemostasis.  The patient tolerated the procedure well and is transported to the cath lab recovery area in stable condition. There were no immediate intraoperative complications. All sponge instrument and needle counts are verified correct at completion of the operation.   No blood products were administered during the operation.  The patient received a total of 40 mL of intravenous contrast during the procedure.   Eugenio Hoes, MD 11/17/2022 12:54 PM

## 2022-11-18 ENCOUNTER — Inpatient Hospital Stay (HOSPITAL_COMMUNITY): Payer: Medicare (Managed Care)

## 2022-11-18 ENCOUNTER — Encounter (HOSPITAL_COMMUNITY): Payer: Self-pay | Admitting: Internal Medicine

## 2022-11-18 DIAGNOSIS — I5033 Acute on chronic diastolic (congestive) heart failure: Secondary | ICD-10-CM | POA: Diagnosis not present

## 2022-11-18 DIAGNOSIS — Z952 Presence of prosthetic heart valve: Secondary | ICD-10-CM | POA: Diagnosis not present

## 2022-11-18 DIAGNOSIS — Z006 Encounter for examination for normal comparison and control in clinical research program: Secondary | ICD-10-CM | POA: Diagnosis not present

## 2022-11-18 DIAGNOSIS — I350 Nonrheumatic aortic (valve) stenosis: Secondary | ICD-10-CM | POA: Diagnosis not present

## 2022-11-18 DIAGNOSIS — E43 Unspecified severe protein-calorie malnutrition: Secondary | ICD-10-CM | POA: Diagnosis not present

## 2022-11-18 LAB — CBC
HCT: 32.8 % — ABNORMAL LOW (ref 36.0–46.0)
Hemoglobin: 10.7 g/dL — ABNORMAL LOW (ref 12.0–15.0)
MCH: 29.2 pg (ref 26.0–34.0)
MCHC: 32.6 g/dL (ref 30.0–36.0)
MCV: 89.4 fL (ref 80.0–100.0)
Platelets: 129 10*3/uL — ABNORMAL LOW (ref 150–400)
RBC: 3.67 MIL/uL — ABNORMAL LOW (ref 3.87–5.11)
RDW: 14.7 % (ref 11.5–15.5)
WBC: 14.6 10*3/uL — ABNORMAL HIGH (ref 4.0–10.5)
nRBC: 0 % (ref 0.0–0.2)

## 2022-11-18 LAB — ECHOCARDIOGRAM COMPLETE
AR max vel: 2.21 cm2
AV Area VTI: 2.39 cm2
AV Area mean vel: 2.32 cm2
AV Mean grad: 11 mmHg
AV Peak grad: 21.8 mmHg
Ao pk vel: 2.34 m/s
Area-P 1/2: 3.01 cm2
Calc EF: 63.1 %
Height: 60 in
MV VTI: 1.87 cm2
S' Lateral: 3 cm
Single Plane A2C EF: 67.1 %
Single Plane A4C EF: 66.5 %
Weight: 2303.37 oz

## 2022-11-18 LAB — BASIC METABOLIC PANEL
Anion gap: 12 (ref 5–15)
BUN: 22 mg/dL (ref 8–23)
CO2: 23 mmol/L (ref 22–32)
Calcium: 8.7 mg/dL — ABNORMAL LOW (ref 8.9–10.3)
Chloride: 102 mmol/L (ref 98–111)
Creatinine, Ser: 1.1 mg/dL — ABNORMAL HIGH (ref 0.44–1.00)
GFR, Estimated: 49 mL/min — ABNORMAL LOW (ref >60)
Glucose, Bld: 167 mg/dL — ABNORMAL HIGH (ref 70–99)
Potassium: 4.1 mmol/L (ref 3.5–5.1)
Sodium: 137 mmol/L (ref 135–145)

## 2022-11-18 LAB — BPAM RBC: Unit Type and Rh: 5100

## 2022-11-18 LAB — MAGNESIUM: Magnesium: 1.4 mg/dL — ABNORMAL LOW (ref 1.7–2.4)

## 2022-11-18 LAB — POCT ACTIVATED CLOTTING TIME
Activated Clotting Time: 244 seconds
Activated Clotting Time: 163 seconds

## 2022-11-18 MED ORDER — ASPIRIN 81 MG PO CHEW: 81.0000 mg | CHEWABLE_TABLET | Freq: Every day | ORAL | Status: AC

## 2022-11-18 MED ORDER — VALSARTAN-HYDROCHLOROTHIAZIDE 80-12.5 MG PO TABS: 1.0000 | ORAL_TABLET | Freq: Every morning | ORAL | Status: DC

## 2022-11-18 MED ORDER — IRBESARTAN 150 MG PO TABS
75.0000 mg | ORAL_TABLET | Freq: Every day | ORAL | Status: DC
  Administered 2022-11-18: 75 mg via ORAL
  Filled 2022-11-18: qty 1

## 2022-11-18 MED ORDER — HYDROCHLOROTHIAZIDE 12.5 MG PO TABS
12.5000 mg | ORAL_TABLET | Freq: Every day | ORAL | Status: DC
  Administered 2022-11-18: 12.5 mg via ORAL
  Filled 2022-11-18 (×2): qty 1

## 2022-11-18 NOTE — Progress Notes (Signed)
CARDIAC REHAB PHASE I   Pt sitting in chair, feeling well this morning. Pt lives in assisted living facility and is mostly confined to wheelchair. Pt is not interested in CRP2 program. Pt feels that she could tolerate coming back and forth to outpatient program. Pt states that she has therapy at current assisted living facility. Post TAVR education, including site care, restrictions, heart healthy diet and CRP2 reviewed. Plan for home later today.   [phone]  Woodroe Chen, RN BSN 11/18/2022 10:15 AM

## 2022-11-18 NOTE — Progress Notes (Signed)
Discharge instructions (including medications) discussed with and copy provided to patient/caregiver 

## 2022-11-19 ENCOUNTER — Telehealth: Payer: Self-pay | Admitting: Physician Assistant

## 2022-11-19 LAB — BPAM RBC: Unit Type and Rh: 5100

## 2022-11-19 NOTE — Anesthesia Postprocedure Evaluation (Signed)
Anesthesia Post Note  Patient: Brandy [name]  Procedure(s) Performed: Transcatheter Aortic Valve Replacement, Transfemoral (Right) INTRAOPERATIVE TRANSTHORACIC ECHOCARDIOGRAM     Patient location during evaluation: PACU Anesthesia Type: MAC Level of consciousness: awake and alert Pain management: pain level controlled Vital Signs Assessment: post-procedure vital signs reviewed and stable Respiratory status: spontaneous breathing, nonlabored ventilation, respiratory function stable and patient connected to nasal cannula oxygen Cardiovascular status: stable and blood pressure returned to baseline Postop Assessment: no apparent nausea or vomiting Anesthetic complications: no   No notable events documented.  Last Vitals:  Vitals:   11/18/22 0750 11/18/22 1124  BP: (!) 126/54 (!) 144/39  Pulse: 82 90  Resp: 20 18  Temp: 36.7 C 36.9 C  SpO2: 95% 95%    Last Pain:  Vitals:   11/18/22 1124  TempSrc: Oral  PainSc:                  Kennieth Rad

## 2022-11-19 NOTE — Telephone Encounter (Signed)
  HEART AND VASCULAR CENTER   MULTIDISCIPLINARY HEART VALVE TEAM   Patient contacted regarding discharge from [facility] on 5/15  Patient understands to follow up with a structural heart APP on 5/24 at [facility].  Patient understands discharge instructions? yes Patient understands medications and regimen? yes Patient understands to bring all medications to this visit? yes  Cline Crock PA-C  [facility]

## 2022-11-21 LAB — TYPE AND SCREEN
ABO/RH(D): O POS
Antibody Screen: NEGATIVE
Unit division: 0
Unit division: 0

## 2022-11-21 LAB — BPAM RBC
Blood Product Expiration Date: 202406132359
Blood Product Expiration Date: 202406132359
ISSUE DATE / TIME: 202405141525

## 2022-11-26 NOTE — Progress Notes (Signed)
[facility]   MULTIDISCIPLINARY HEART VALVE CLINIC                                     Cardiology Office Note:    Date:  11/27/2022   ID:  Brandy [name], DOB [birth year], MRN [id number]  PCP:  Sigmund Hazel, MD  [facility] Cardiologist:  Orbie Pyo, MD  [facility] Electrophysiologist:  None   Referring MD: Sigmund Hazel, MD   [facility] s/p TAVR  History of Present Illness:    Brandy [name] is a 89 y.o. 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  The patient is well-known to the structural heart clinic and was referred for TAVR in 05/2021. She underwent [facility] 05/2021 which showed no CAD.  She ultimately declined intervention at that time. In 02/2022, the patient developed acute cholecystitis with hepatic bile duct dilatation and was treated with antibiotics. Laparoscopic cholecystectomy was deferred at that time given severe AS.  She returned to the structural heart clinic and was more interested in proceeding with TAVR in order to have her GI surgery. Echo 02/08/22 showed EF 60%, mod LVH, and severe AS with a mean grad 31 mmHg, peak grad 53.9 mmHg, AVA 0.76, DVI 0.24, severe MAC with moderate MS (mean gradient 9 mmHg) and mild MR. She was then readmitted in 03/2022 with mechanical fall and sacral/pubic ramus fracture and then again in 07/2022 for a fall resulting in a neck fracture.  She was seen back in our clinic and plans were made for continued surveillance and recuperation from orthopedic injuries. She then presented to the ER on 10/24/22 for evaluation of chest pain. HStrop flat and  ECG without acute ischemia. Chest pain was felt to be related to severe AS and she was referred back to [place]. She was seen in the office by Dr. Lynnette Caffey on 10/28/22 and reported living at an independent living facility and doing generally well, albeit with a low functional capacity. Mostly wheelchair bound but able to walk short distances. Given worsening DOE with transfers and chest pain it was decided to proceed with TAVR.     The patient was evaluated by the multidisciplinary valve team and underwent uccessful TAVR with a 23 mm Edwards Sapien 3 Ultra Resilia THV via the TF approach on 11/17/22. Post operative echo showed EF 65%, normally functioning TAVR with a mean gradient of 11 mmHg and trivial PVL as well as severe MAC with moderate MS. Hg dropped to 5.4. She was treated with 1U PRBCs and Hg improved to 10.7. She has had no evidence of acute bleeding and likely related to blood loss during case and hemodilution. Also treated with IV lasix for volume overload. She was started on a baby aspirin at discharge.   Today the patient presents to clinic for follow up. Here with daughter. Remembers part of the surgery. Cognition and memory have been off since surgery. No CP or SOB. No LE edema, orthopnea or PND. No dizziness or syncope. No blood in stool or urine. No palpitations.  Past Medical History:  Diagnosis Date   Anemia    Arthritis    shoulders   Asthma    mild, no inhalers used   Breast cancer (HCC) 12/18/2011   left breast masectomy=metastatic ca in (1/1) lymph node ,invasive ductal ca,2 foci,,dcis,lymph ovascular invasion identified,surgical resection margins neg for ca,additional tissue=benign skin and subcutaneous tissue   Bronchitis    hx of;last time >51yr ago   Depression    takes Celexa daily   Full dentures    Gall stones 2015   GERD (gastroesophageal reflux disease)    takes Prilosec prn   H/O hiatal hernia    History of kidney stones    many yrs ago   Hx of radiation therapy  04/26/12 -06/10/12   left breast   Hyperlipidemia    takes Simvastatin daily   Hypertension    takes Amlodipine and Diovan daily   Insomnia    takes Ambien nightly   S/P TAVR (transcatheter aortic valve replacement) 11/17/2022   s/p TAVR with a 23 mm Edwards S3UR via the TF approach by Dr. Lynnette Caffey & Dr. Leafy Ro   Severe aortic stenosis    Urinary urgency     Past Surgical History:  Procedure Laterality Date   APPENDECTOMY     BILIARY DILATION  02/07/2022   Procedure: BILIARY DILATION;  Surgeon: Lynann Bologna, MD;  Location: [facility] ENDOSCOPY;  Service: Gastroenterology;;   BILIARY DILATION  03/19/2022   Procedure: BILIARY DILATION;  Surgeon: Lemar Lofty., MD;  Location: [facility] ENDOSCOPY;  Service: Gastroenterology;;   BILIARY STENT PLACEMENT  02/07/2022   Procedure: BILIARY STENT PLACEMENT;  Surgeon: Lynann Bologna, MD;  Location: [facility] ENDOSCOPY;  Service: Gastroenterology;;   BIOPSY  03/19/2022   Procedure: BIOPSY;  Surgeon: Lemar Lofty., MD;  Location: [facility] ENDOSCOPY;  Service: Gastroenterology;;   bladder tack     BREAST BIOPSY  1998   left    DILATION AND CURETTAGE OF UTERUS     ENDOSCOPIC RETROGRADE CHOLANGIOPANCREATOGRAPHY (ERCP) WITH PROPOFOL N/A 02/01/2014   Procedure: ENDOSCOPIC RETROGRADE CHOLANGIOPANCREATOGRAPHY (ERCP) WITH PROPOFOL;  Surgeon: Rachael Fee, MD;  Location: [facility] ENDOSCOPY;  Service: Endoscopy;  Laterality: N/A;   ENDOSCOPIC RETROGRADE CHOLANGIOPANCREATOGRAPHY (ERCP) WITH PROPOFOL N/A 03/19/2022   Procedure: ENDOSCOPIC RETROGRADE CHOLANGIOPANCREATOGRAPHY (ERCP) WITH PROPOFOL;  Surgeon: Meridee Score Netty Starring., MD;  Location: [facility] ENDOSCOPY;  Service: Gastroenterology;  Laterality: N/A;   ERCP N/A 02/07/2022   Procedure: ENDOSCOPIC RETROGRADE CHOLANGIOPANCREATOGRAPHY (ERCP);  Surgeon: Lynann Bologna, MD;  Location: [facility] ENDOSCOPY;  Service: Gastroenterology;  Laterality: N/A;   ESOPHAGOGASTRODUODENOSCOPY     ESOPHAGOGASTRODUODENOSCOPY N/A 06/26/2016   Procedure:  ESOPHAGOGASTRODUODENOSCOPY (EGD);  Surgeon: Rachael Fee, MD;  Location: [facility] ENDOSCOPY;  Service: Endoscopy;  Laterality: N/A;   ESOPHAGOGASTRODUODENOSCOPY (EGD) WITH PROPOFOL  12/19/2013   Procedure: ESOPHAGOGASTRODUODENOSCOPY (EGD) WITH PROPOFOL;  Surgeon: Theda Belfast, MD;  Location: [facility] ENDOSCOPY;  Service: Endoscopy;;   ESOPHAGOGASTRODUODENOSCOPY (EGD) WITH PROPOFOL N/A 09/10/2016   Procedure: ESOPHAGOGASTRODUODENOSCOPY (EGD) WITH PROPOFOL;  Surgeon: Rachael Fee, MD;  Location: [facility] ENDOSCOPY;  Service: Endoscopy;  Laterality: N/A;   EUS  06/04/2011   Procedure: UPPER ENDOSCOPIC ULTRASOUND (EUS) LINEAR;  Surgeon: Rob Bunting, MD;  Location: [facility] ENDOSCOPY;  Service: Endoscopy;  Laterality: N/A;   EUS N/A 02/01/2014   Procedure: UPPER ENDOSCOPIC ULTRASOUND (EUS) LINEAR;  Surgeon: Rachael Fee, MD;  Location: [facility] ENDOSCOPY;  Service: Endoscopy;  Laterality: N/A;   EXPLORATORY LAPAROTOMY      biopsy of intra-abdominal mass   INTRAMEDULLARY (IM) NAIL INTERTROCHANTERIC  Right 10/24/2018   Procedure: INTRAMEDULLARY (IM) NAIL INTERTROCHANTRIC;  Surgeon: Ollen Gross, MD;  Location: [facility] ORS;  Service: Orthopedics;  Laterality: Right;   INTRAOPERATIVE TRANSTHORACIC ECHOCARDIOGRAM N/A 11/17/2022   Procedure: INTRAOPERATIVE TRANSTHORACIC ECHOCARDIOGRAM;  Surgeon: Orbie Pyo, MD;  Location: [facility] INVASIVE CV LAB;  Service: Open Heart Surgery;  Laterality: N/A;   LAPAROTOMY N/A 11/17/2016   Procedure: EXPLORATORY LAPAROTOMY WITH LYSIS OF ADHESIONS, SMALL BOWEL RESECTION, REPAIR OF INCARCERATED VENTRAL INCISIONAL HERNIA, INSERTION OF BIOLOGIC MESH PATCH,APPLICATION OF WOUND VAC DRESSING;  Surgeon: Darnell Level, MD;  Location: [facility] ORS;  Service: General;  Laterality: N/A;   MASTECTOMY W/ SENTINEL NODE BIOPSY  12/18/2011   Procedure: MASTECTOMY WITH SENTINEL LYMPH NODE BIOPSY;  Surgeon: Emelia Loron, MD;  Location: [facility] OR;  Service: General;  Laterality: Left;   PORT-A-CATH REMOVAL Right 06/15/2013    Procedure: REMOVAL PORT-A-CATH;  Surgeon: Emelia Loron, MD;  Location: [facility];  Service: General;  Laterality: Right;   PORTACATH PLACEMENT  01/27/2012   Procedure: INSERTION PORT-A-CATH;  Surgeon: Emelia Loron, MD;  Location:  SURGERY CENTER;  Service: General;  Laterality: Right;  PORT PLACEMENT   REMOVAL OF STONES  02/07/2022   Procedure: REMOVAL OF STONES;  Surgeon: Lynann Bologna, MD;  Location: [facility] ENDOSCOPY;  Service: Gastroenterology;;   REMOVAL OF STONES  03/19/2022   Procedure: REMOVAL OF STONES;  Surgeon: Lemar Lofty., MD;  Location: [facility] ENDOSCOPY;  Service: Gastroenterology;;   RIGHT/LEFT HEART CATH AND CORONARY ANGIOGRAPHY N/A 05/08/2021   Procedure: RIGHT/LEFT HEART CATH AND CORONARY ANGIOGRAPHY;  Surgeon: Corky Crafts, MD;  Location: [facility] INVASIVE CV LAB;  Service: Cardiovascular;  Laterality: N/A;   SPHINCTEROTOMY  02/07/2022   Procedure: SPHINCTEROTOMY;  Surgeon: Lynann Bologna, MD;  Location: [facility] ENDOSCOPY;  Service: Gastroenterology;;   Burman Fre[place] CHOLANGIOSCOPY N/A 03/19/2022   Procedure: ZOXWRUEA CHOLANGIOSCOPY;  Surgeon: Lemar Lofty., MD;  Location: [facility] ENDOSCOPY;  Service: Gastroenterology;  Laterality: N/A;   STENT REMOVAL  03/19/2022   Procedure: STENT REMOVAL;  Surgeon: Lemar Lofty., MD;  Location: [facility] ENDOSCOPY;  Service: Gastroenterology;;   TOTAL HIP ARTHROPLASTY Right 11/14/2018   Procedure: REMOVAL OF INTRAMEDULLARY NAIL AND POSTERIOR TOTAL HIP ARTHROPLASTY;  Surgeon: Ollen Gross, MD;  Location: [facility] ORS;  Service: Orthopedics;  Laterality: Right;   TOTAL SHOULDER REPLACEMENT  2011   left   TRANSCATHETER AORTIC VALVE REPLACEMENT, TRANSFEMORAL Right 11/17/2022   Procedure: Transcatheter Aortic Valve Replacement, Transfemoral;  Surgeon: Orbie Pyo, MD;  Location: [facility] INVASIVE CV LAB;  Service: Open Heart Surgery;  Laterality: Right;   TUBAL LIGATION      Current Medications: No outpatient medications  have been marked as taking for the 11/27/22 encounter (Office Visit) with [facility].     Allergies:   Patient has no known allergies.   Social History   Socioeconomic History   Marital status: Widowed    Spouse name: Not on file   Number of children: Not on file   Years of education: Not on file   Highest education level: Not on file  Occupational History   Occupation: retired  Tobacco Use   Smoking status: Never   Smokeless tobacco: Never  Vaping Use   Vaping Use: Never used  Substance and Sexual Activity   Alcohol use: No   Drug use: No   Sexual activity: Yes    Birth control/protection: Surgical    Comment: mensus age 32, 1st pregnancy 35, no hrt gg4,p3, 1 babay lived a few hours  complications  Other Topics Concern   Not on file  Social History Narrative   Not on file   Social Determinants of Health   Financial Resource Strain: Not on file  Food Insecurity: No Food Insecurity (03/27/2022)   Hunger Vital Sign    Worried About Running Out of Food in the Last Year: Never true    Ran Out of Food in the Last Year: Never true  Transportation Needs: No Transportation Needs (03/27/2022)   PRAPARE - Administrator, Civil Service (Medical): No    Lack of Transportation (Non-Medical): No  Physical Activity: Unknown (10/29/2018)   Exercise Vital Sign    Days of Exercise per Week: Patient declined    Minutes of Exercise per Session: Patient declined  Stress: Not on file  Social Connections: Unknown (10/29/2018)   Social Connection and Isolation Panel [NHANES]    Frequency of Communication with Friends and Family: Patient declined    Frequency of Social Gatherings with Friends and Family: Patient declined    Attends Religious Services: Patient declined    Database administrator or Organizations: Patient declined    Attends Banker Meetings: Patient declined    Marital Status: Patient declined     Family History: The patient's  family history includes Breast cancer in an other family member; Prostate cancer in her father.  ROS:   Please see the history of present illness.    All other systems reviewed and are negative.  EKGs/Labs/Other Studies Reviewed:    Cardiac Studies & Procedures   CARDIAC CATHETERIZATION  CARDIAC CATHETERIZATION 05/08/2021  Narrative   No angiographically apparent coronary artery disease.   Ao sat 94%, PA sat 63%, PA pressure 26/6, mean PA pressure 13 mmHg, mean pulmonary capillary wedge pressure 5 mmHg, cardiac output 5.4 L/min, cardiac index 3.39.   Severe right subclavian tortuosity.  If cardiac cath was needed in the future, would not use right radial approach.   Did not cross aortic valve.  Continue with plans for TAVR work-up.  Findings Coronary Findings Diagnostic  Dominance: Right  Left Main Vessel was injected. Vessel is normal in caliber. Vessel is angiographically normal.  Left Anterior Descending Vessel was injected. Vessel is normal in caliber. Vessel is angiographically normal.  Left Circumflex Vessel was injected. Vessel is normal in caliber. Vessel is angiographically normal.  Right Coronary Artery Vessel was injected. Vessel is normal in caliber. Vessel is angiographically normal.  Intervention  No interventions have been documented.   STRESS TESTS  NM MYOCAR MULTI W/SPECT W 11/17/2016  Narrative CLINICAL DATA:  Hypertension, asthma, abdominal pain. Preop for surgery.  EXAM: MYOCARDIAL IMAGING WITH SPECT (REST AND PHARMACOLOGIC-STRESS)  GATED LEFT VENTRICULAR WALL MOTION STUDY  LEFT VENTRICULAR EJECTION FRACTION  TECHNIQUE: Standard myocardial SPECT imaging was performed after resting intravenous injection of 10 mCi Tc-37m tetrofosmin. Subsequently, intravenous infusion of Lexiscan was performed under the supervision of the Cardiology staff. At peak effect of the drug, 30 mCi Tc-70m tetrofosmin was injected intravenously and standard  myocardial SPECT imaging was performed. Quantitative gated imaging was also performed to evaluate left ventricular wall motion, and estimate left ventricular ejection fraction.  COMPARISON:  None.  FINDINGS: Perfusion: Decreased activity in the inferior wall on both sets of images likely due to scar. No reversible defects to suggest ischemia.  Wall Motion: Decrease thickening and activity in the inferior wall. The remainder the left ventricle demonstrates normal wall motion with an myocardial thickening with contraction.  Left Ventricular Ejection Fraction:  71 %  End diastolic volume 66 ml  End systolic volume 19 ml  IMPRESSION: 1. Suspect inferior wall scar/infarction. No findings for ischemia.  2. Normal left ventricular wall motion except for the inferior wall.  3. Left ventricular ejection fraction 71%  4. Non invasive risk stratification*: Intermediate  *2012 Appropriate Use Criteria for Coronary Revascularization Focused Update: J Am Coll Cardiol. 2012;59(9):857-881. http://content.dementiazones.com.aspx?articleid=1201161   Electronically Signed By: Rudie Meyer M.D. On: 11/17/2016 12:28   ECHOCARDIOGRAM  ECHOCARDIOGRAM COMPLETE 11/18/2022  Narrative ECHOCARDIOGRAM REPORT    Patient Name:   KIARI DANZY [facility] Date of Exam: 11/18/2022 Medical Rec #:  [id number]       Height:       60.0 in Accession #:    [id number]      Weight:       144.0 lb Date of Birth:  October 17, 1932        BSA:          1.623 m Patient Age:    89 years        BP:           119/68 mmHg Patient Gender: F               HR:           79 bpm. Exam Location:  Inpatient  Procedure: 2D Echo  Indications:    Post TAVR evaluation  History:        Patient has prior history of Echocardiogram examinations. Aortic Valve Disease and Post TAVR.  Sonographer:    Wallie Char Referring Phys: [id number] Elman Dettman R Buckley Bradly  IMPRESSIONS   1. Left ventricular ejection fraction, by estimation, is  65 to 70%. The left ventricle has normal function. The left ventricle has no regional wall motion abnormalities. There is moderate left ventricular hypertrophy. Left ventricular diastolic parameters are indeterminate. 2. Right ventricular systolic function is normal. The right ventricular size is normal. 3. Left atrial size was moderately dilated. 4. The mitral valve is abnormal. Trivial mitral valve regurgitation. Moderate mitral stenosis. Severe mitral annular calcification. 5. Post TAVR 23 mm Sapien 3 valve trivial PVL at 1:00 on PSSX images mean gradient 11 peak 21.8 mmHg AVA 2.3 cm2 DVI 0.94 . The aortic valve has been repaired/replaced. Aortic valve regurgitation is not visualized. No aortic stenosis is present. 6. The inferior vena cava is normal in size with greater than 50% respiratory variability, suggesting right atrial pressure of 3 mmHg.  FINDINGS Left Ventricle: Left ventricular ejection fraction, by estimation, is 65 to 70%. The left ventricle has normal function. The left ventricle has no regional wall motion abnormalities. The left ventricular internal cavity size was normal in size. There is moderate left ventricular hypertrophy. Left ventricular diastolic parameters are indeterminate.  Right Ventricle: The right ventricular size is normal. No increase in right ventricular wall thickness. Right ventricular systolic function is normal.  Left Atrium: Left atrial size was moderately dilated.  Right Atrium: Right atrial size was normal in size.  Pericardium: There is no evidence of pericardial effusion.  Mitral Valve: The mitral valve is abnormal. There is moderate thickening of the mitral valve leaflet(s). There is moderate calcification of the mitral valve leaflet(s). Severe mitral annular calcification. Trivial mitral valve regurgitation. Moderate mitral valve stenosis. MV peak gradient, 13.2 mmHg. The mean mitral valve gradient is 6.0 mmHg.  Tricuspid Valve: The tricuspid  valve is normal in structure. Tricuspid valve regurgitation is mild . No evidence of tricuspid stenosis.  Aortic Valve: Post  TAVR 23 mm Sapien 3 valve trivial PVL at 1:00 on PSSX images mean gradient 11 peak 21.8 mmHg AVA 2.3 cm2 DVI 0.94. The aortic valve has been repaired/replaced. Aortic valve regurgitation is not visualized. No aortic stenosis is present. Aortic valve mean gradient measures 11.0 mmHg. Aortic valve peak gradient measures 21.8 mmHg. Aortic valve area, by VTI measures 2.39 cm.  Pulmonic Valve: The pulmonic valve was normal in structure. Pulmonic valve regurgitation is trivial. No evidence of pulmonic stenosis.  Aorta: The aortic root is normal in size and structure.  Venous: The inferior vena cava is normal in size with greater than 50% respiratory variability, suggesting right atrial pressure of 3 mmHg.  IAS/Shunts: No atrial level shunt detected by color flow Doppler.   LEFT VENTRICLE PLAX 2D LVIDd:         4.80 cm     Diastology LVIDs:         3.00 cm     LV e' medial:    3.87 cm/s LV PW:         1.10 cm     LV E/e' medial:  27.9 LV IVS:        1.30 cm     LV e' lateral:   5.80 cm/s LVOT diam:     1.80 cm     LV E/e' lateral: 18.6 LV SV:         107 LV SV Index:   66 LVOT Area:     2.54 cm  LV Volumes (MOD) LV vol d, MOD A2C: 44.1 ml LV vol d, MOD A4C: 68.4 ml LV vol s, MOD A2C: 14.5 ml LV vol s, MOD A4C: 22.9 ml LV SV MOD A2C:     29.6 ml LV SV MOD A4C:     68.4 ml LV SV MOD BP:      34.7 ml  RIGHT VENTRICLE             IVC RV Basal diam:  3.20 cm     IVC diam: 1.40 cm RV S prime:     18.00 cm/s TAPSE (M-mode): 2.2 cm  LEFT ATRIUM             Index        RIGHT ATRIUM           Index LA diam:        4.50 cm 2.77 cm/m   RA Area:     16.30 cm LA Vol (A2C):   70.7 ml 43.56 ml/m  RA Volume:   40.90 ml  25.20 ml/m LA Vol (A4C):   62.6 ml 38.57 ml/m LA Biplane Vol: 66.7 ml 41.09 ml/m AORTIC VALVE AV Area (Vmax):    2.21 cm AV Area (Vmean):   2.32  cm AV Area (VTI):     2.39 cm AV Vmax:           233.50 cm/s AV Vmean:          153.250 cm/s AV VTI:            0.447 m AV Peak Grad:      21.8 mmHg AV Mean Grad:      11.0 mmHg LVOT Vmax:         202.50 cm/s LVOT Vmean:        140.000 cm/s LVOT VTI:          0.420 m LVOT/AV VTI ratio: 0.94  AORTA Ao Root diam: 2.60 cm Ao Asc diam:  3.10 cm  MITRAL VALVE                TRICUSPID VALVE MV Area (PHT): 3.01 cm     TR Peak grad:   25.4 mmHg MV Area VTI:   1.87 cm     TR Vmax:        252.00 cm/s MV Peak grad:  13.2 mmHg MV Mean grad:  6.0 mmHg     SHUNTS MV Vmax:       1.82 m/s     Systemic VTI:  0.42 m MV Vmean:      111.0 cm/s   Systemic Diam: 1.80 cm MV Decel Time: 252 msec MV E velocity: 108.00 cm/s MV A velocity: 190.00 cm/s MV E/A ratio:  0.57  Charlton Haws MD Electronically signed by Charlton Haws MD Signature Date/Time: 11/18/2022/12:40:21 PM    Final     CT SCANS  CT CORONARY MORPH W/CTA COR W/SCORE 03/16/2022  Addendum 03/16/2022  3:28 PM ADDENDUM REPORT: 03/16/2022 15:26  CLINICAL DATA:  Aortic valve replacement (TAVR), pre-op eval  EXAM: Cardiac TAVR CT  TECHNIQUE: The patient was scanned on a Siemens Force 192 slice scanner. A 120 kV retrospective scan was triggered in the descending thoracic aorta at 111 HU's. Gantry rotation speed was 270 msecs and collimation was .9 mm. The 3D data set was reconstructed in 5% intervals of the R-R cycle. Systolic and diastolic phases were analyzed on a dedicated work station using MPR, MIP and VRT modes. The patient received OMNIPAQUE IOHEXOL 350 MG/ML SOLN of contrast.  FINDINGS: Aortic Valve:  Tricuspid aortic valve. Severely reduced cusp separation. Moderately thickened, severely calcified aortic valve cusps.  AV calcium score: 2283  Virtual Basal Annulus Measurements:  Maximum/Minimum Diameter: 27.5 x 20.8 mm  Perimeter: 76.3 mm  Area: 439 mm2  Mild LVOT calcifications under NCC/LCC  commissure.  Membranous septal length: 4.5 mm  Based on these measurements, the annulus would be suitable for a 26 mm Edward Sapien 3 valve. Alternatively, consider a 29 mm Medtronic Evolut Pro valve. Recommend structural heart team discussion for valve selection.  Sinus of Valsalva Measurements:  Non-coronary:  32 mm  Right - coronary:  29 mm  Left - coronary:  30 mm  Sinus of Valsalva Height:  Left: 23.6 mm  Right: 22.5 mm  Aorta: Short segment common origin of the brachiocephalic and left common carotid arteries off the aortic arch.  Sinotubular Junction:  28 mm  Ascending Thoracic Aorta:  29 mm  Aortic Arch:  25 mm  Descending Thoracic Aorta:  25 mm  Coronary Artery Height above Annulus:  Left main: 19.5 mm  Right coronary: 19.1 mm  Coronary Arteries: Normal coronary origin. Right dominance. The study was performed without use of NTG and insufficient for plaque evaluation. Coronary artery calcium seen in 3 vessel distribution.  Optimum Fluoroscopic Angle for Delivery: LAO 9 CAU 9  OTHER:  Left atrial appendage: No thrombus.  Mitral valve: Thickened leaflets, severe mitral annular calcifications.  Pulmonary artery: Mildly dilated main and branch pulmonary arteries.  Pulmonary veins: Normal anatomy.  IMPRESSION: 1. Tricuspid aortic valve with severely reduced cusp excursion. Moderately thickened and severely calcified aortic valve cusps.  2.  Aortic valve calcium score: 2283  3. Annulus area: 439 mm2, suitable for 26 mm Sapien 3 valve. Mild LVOT calcifications.  4.  Sufficient coronary artery heights from annulus.  5.  Optimum Fluoroscopic Angle for Delivery: LAO 9 CAU 9  6.  Severe mitral annular calcification.   Electronically  Signed By: Weston Brass M.D. On: 03/16/2022 15:26  Narrative EXAM: OVER-READ INTERPRETATION  CT CHEST  The following report is a limited chest CT over-read performed by radiologist Dr. Cleone Slim of  [facility] on 03/16/2022. This over-read does not include interpretation of cardiac or coronary anatomy or pathology. The cardiac CTA interpretation by the cardiologist is attached.  COMPARISON:  02/06/2022 CT angiogram of the chest, abdomen and pelvis.  FINDINGS: Please see the separate concurrent chest CT angiogram report for details.  IMPRESSION: Please see the separate concurrent chest CT angiogram report for details.  Electronically Signed: By: Delbert Phenix M.D. On: 03/16/2022 13:54          EKG:  EKG is ordered today.  The ekg ordered today demonstrates sinus with 1st deg AV block, HR 84  Recent Labs: 02/07/2022: B Natriuretic Peptide 650.5 11/13/2022: ALT 17 11/18/2022: BUN 22; Creatinine, Ser 1.10; Hemoglobin 10.7; Magnesium 1.4; Platelets 129; Potassium 4.1; Sodium 137  Recent Lipid Panel    Component Value Date/Time   TRIG 295 (H) 10/29/2018 1340     Risk Assessment/Calculations:       Physical Exam:    VS:  BP 134/60   Pulse 84   Ht 5' (1.524 m)   Wt 138 lb 3.2 oz (62.7 kg)   SpO2 98%   BMI 26.99 kg/m     Wt Readings from Last 3 Encounters:  11/27/22 138 lb 3.2 oz (62.7 kg)  11/18/22 143 lb 15.4 oz (65.3 kg)  11/02/22 149 lb (67.6 kg)     GEN: elderly and frail appearing. HEENT: Normal NECK: No JVD LYMPHATICS: No lymphadenopathy CARDIAC: RRR, no murmurs, rubs, gallops RESPIRATORY:  Clear to auscultation without rales, wheezing or rhonchi  ABDOMEN: Soft, non-tender, non-distended MUSCULOSKELETAL:  No edema; No deformity  SKIN: Warm and dry.  Groin sites clear without hematoma or ecchymosis  NEUROLOGIC:  Alert and oriented x 3 PSYCHIATRIC:  Normal affect   ASSESSMENT:    1. S/P TAVR (transcatheter aortic valve replacement)   2. Anemia of chronic disease   3. Chronic diastolic CHF (congestive heart failure) (HCC)   4. Nonrheumatic mitral valve stenosis   5. Stage 3a chronic kidney disease (HCC)   6. Essential hypertension     PLAN:    In order of problems listed above:  Severe AS s/p TAVR: doing fine 1 week out from TAVR. Groin sites are clear. ECG with no HAVB. Continue baby Asprin 81 mg daily. SBE prophylaxis discussed; the patient is edentulous and does not go to the dentist. I will see her back in 1 month for follow up and echo   Acute post op anemia: Hg dropped to 5.4. She was treated with 1U PRBCs and Hg improved to 10.7. Check blood counts today   Chronic diastolic CHF: appears euvolemic. Continue Diovan-HCT. BMET today   MAC with mitral stenosis: continue to follow on serial echos   CKD stage IIIa: creat 1.10 at discharge. Check BMET   HTN: BP has been well controlled. No changes made  Medication Adjustments/Labs and Tests Ordered: Current medicines are reviewed at length with the patient today.  Concerns regarding medicines are outlined above.  Orders Placed This Encounter  Procedures   Basic metabolic panel   CBC   EKG 12-Lead   No orders of the defined types were placed in this encounter.   Patient Instructions  Medication Instructions:  Your physician recommends that you continue on your current medications as directed. Please refer to  the Current Medication list given to you today.  *If you need a refill on your cardiac medications before your next appointment, please call your pharmacy*   Lab Work: TODAY: BMP, CBC  If you have labs (blood work) drawn today and your tests are completely normal, you will receive your results only by: MyChart Message (if you have MyChart) OR A paper copy in the mail If you have any lab test that is abnormal or we need to change your treatment, we will call you to review the results.   Testing/Procedures: NONE   Follow-Up:As Scheduled At [facility], you and your health needs are our priority.  As part of our continuing mission to provide you with exceptional heart care, we have created designated Provider Care Teams.  These Care  Teams include your primary Cardiologist (physician) and Advanced Practice Providers (APPs -  Physician Assistants and Nurse Practitioners) who all work together to provide you with the care you need, when you need it.     Signed, Cline Crock, PA-C  11/27/2022 10:52 AM    [facility]

## 2022-11-27 ENCOUNTER — Ambulatory Visit: Payer: Medicare (Managed Care) | Attending: Physician Assistant | Admitting: Physician Assistant

## 2022-11-27 VITALS — BP 134/60 | HR 84 | Ht 60.0 in | Wt 138.2 lb

## 2022-11-27 DIAGNOSIS — I5032 Chronic diastolic (congestive) heart failure: Secondary | ICD-10-CM | POA: Diagnosis not present

## 2022-11-27 DIAGNOSIS — D638 Anemia in other chronic diseases classified elsewhere: Secondary | ICD-10-CM | POA: Diagnosis not present

## 2022-11-27 DIAGNOSIS — Z952 Presence of prosthetic heart valve: Secondary | ICD-10-CM

## 2022-11-27 DIAGNOSIS — I342 Nonrheumatic mitral (valve) stenosis: Secondary | ICD-10-CM

## 2022-11-27 DIAGNOSIS — N1831 Chronic kidney disease, stage 3a: Secondary | ICD-10-CM

## 2022-11-27 DIAGNOSIS — I10 Essential (primary) hypertension: Secondary | ICD-10-CM

## 2022-11-27 NOTE — Patient Instructions (Signed)
Medication Instructions:  Your physician recommends that you continue on your current medications as directed. Please refer to the Current Medication list given to you today.  *If you need a refill on your cardiac medications before your next appointment, please call your pharmacy*   Lab Work: TODAY: BMP, CBC  If you have labs (blood work) drawn today and your tests are completely normal, you will receive your results only by: MyChart Message (if you have MyChart) OR A paper copy in the mail If you have any lab test that is abnormal or we need to change your treatment, we will call you to review the results.   Testing/Procedures: NONE   Follow-Up:As Scheduled At [facility], you and your health needs are our priority.  As part of our continuing mission to provide you with exceptional heart care, we have created designated Provider Care Teams.  These Care Teams include your primary Cardiologist (physician) and Advanced Practice Providers (APPs -  Physician Assistants and Nurse Practitioners) who all work together to provide you with the care you need, when you need it.

## 2022-11-28 LAB — CBC
Hematocrit: 27.3 % — ABNORMAL LOW (ref 34.0–46.6)
Hemoglobin: 8.8 g/dL — ABNORMAL LOW (ref 11.1–15.9)
MCH: 28.9 pg (ref 26.6–33.0)
MCHC: 32.2 g/dL (ref 31.5–35.7)
MCV: 90 fL (ref 79–97)
Platelets: 158 10*3/uL (ref 150–450)
RBC: 3.05 x10E6/uL — ABNORMAL LOW (ref 3.77–5.28)
RDW: 13.6 % (ref 11.7–15.4)
WBC: 8.4 10*3/uL (ref 3.4–10.8)

## 2022-11-28 LAB — BASIC METABOLIC PANEL
BUN/Creatinine Ratio: 27 (ref 12–28)
BUN: 32 mg/dL — ABNORMAL HIGH (ref 8–27)
CO2: 24 mmol/L (ref 20–29)
Calcium: 8.8 mg/dL (ref 8.7–10.3)
Chloride: 105 mmol/L (ref 96–106)
Creatinine, Ser: 1.2 mg/dL — ABNORMAL HIGH (ref 0.57–1.00)
Glucose: 129 mg/dL — ABNORMAL HIGH (ref 70–99)
Potassium: 4.1 mmol/L (ref 3.5–5.2)
Sodium: 142 mmol/L (ref 134–144)
eGFR: 44 mL/min/{1.73_m2} — ABNORMAL LOW (ref >59)

## 2022-12-25 ENCOUNTER — Other Ambulatory Visit: Payer: Self-pay | Admitting: Physician Assistant

## 2022-12-25 ENCOUNTER — Ambulatory Visit: Payer: Medicare (Managed Care) | Attending: Cardiology | Admitting: Physician Assistant

## 2022-12-25 ENCOUNTER — Ambulatory Visit (HOSPITAL_COMMUNITY): Payer: Medicare (Managed Care) | Attending: Cardiovascular Disease

## 2022-12-25 VITALS — BP 136/54 | HR 62 | Ht 62.0 in | Wt 139.2 lb

## 2022-12-25 DIAGNOSIS — I5032 Chronic diastolic (congestive) heart failure: Secondary | ICD-10-CM

## 2022-12-25 DIAGNOSIS — I342 Nonrheumatic mitral (valve) stenosis: Secondary | ICD-10-CM | POA: Diagnosis not present

## 2022-12-25 DIAGNOSIS — I10 Essential (primary) hypertension: Secondary | ICD-10-CM

## 2022-12-25 DIAGNOSIS — D638 Anemia in other chronic diseases classified elsewhere: Secondary | ICD-10-CM | POA: Diagnosis not present

## 2022-12-25 DIAGNOSIS — Z952 Presence of prosthetic heart valve: Secondary | ICD-10-CM | POA: Insufficient documentation

## 2022-12-25 DIAGNOSIS — N1831 Chronic kidney disease, stage 3a: Secondary | ICD-10-CM

## 2022-12-25 DIAGNOSIS — K829 Disease of gallbladder, unspecified: Secondary | ICD-10-CM

## 2022-12-25 LAB — ECHOCARDIOGRAM COMPLETE
AR max vel: 1.53 cm2
AV Area VTI: 1.7 cm2
AV Area mean vel: 1.67 cm2
AV Mean grad: 10.6 mmHg
AV Peak grad: 22.7 mmHg
Ao pk vel: 2.38 m/s
Area-P 1/2: 1.86 cm2
P 1/2 time: 456 msec
S' Lateral: 3 cm

## 2022-12-25 NOTE — Progress Notes (Signed)
[facility]   MULTIDISCIPLINARY HEART VALVE CLINIC                                     Cardiology Office Note:    Date:  12/25/2022   ID:  Brandy [name], DOB 06-Jun-1933, MRN [id number]  PCP:  Sigmund Hazel, MD  [facility] Cardiologist:  Orbie Pyo, MD  [facility] Electrophysiologist:  None   Referring MD: Sigmund Hazel, MD   1 month s/p TAVR  History of Present Illness:    Brandy [name] is a 89 y.o. 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  The patient is well-known to the structural heart clinic and was referred for TAVR in 05/2021. She underwent [facility] 05/2021 which showed no CAD.  She ultimately declined intervention at that time. In 02/2022, the patient developed acute cholecystitis with hepatic bile duct dilatation and was treated with antibiotics. Laparoscopic cholecystectomy was deferred at that time given severe AS.  She returned to the structural heart clinic and was more interested in proceeding with TAVR in order to have her GI surgery. Echo 02/08/22 showed EF 60%, mod LVH, and severe AS with a mean grad 31 mmHg, peak grad 53.9 mmHg, AVA 0.76, DVI 0.24, severe MAC with moderate MS (mean gradient 9 mmHg) and mild MR. She was then readmitted in 03/2022 with mechanical fall and sacral/pubic ramus fracture and then again in 07/2022 for a fall resulting in a neck fracture.  She was seen back in our clinic and plans were made for continued surveillance and recuperation from orthopedic injuries. She then presented to the ER on 10/24/22 for evaluation of chest pain. HStrop flat and ECG without acute ischemia. Chest pain was felt to be related to severe AS and she was referred back to  [place]. She was seen in the office by Dr. Lynnette Caffey on 10/28/22 and reported living at an independent living facility and doing generally well, albeit with a low functional capacity. Mostly wheelchair bound but able to walk short distances. Given worsening DOE with transfers and chest pain it was decided to proceed with TAVR.    She underwent TAVR with a 23 mm Edwards Sapien 3 Ultra Resilia THV via the TF approach on 11/17/22. Post operative echo showed EF 65%, normally functioning TAVR with a mean gradient of 11 mmHg and trivial PVL as well as severe MAC with moderate MS. Hg dropped to 5.4. She was treated with 1U PRBCs and Hg improved to 10.7. She has had no evidence of acute bleeding and likely related to blood loss during case and hemodilution. Also treated with IV lasix for volume overload. She was started on a baby aspirin at discharge. Follow up labs showed Hg 8.8. Has done well in follow up aside from some memory issues.  Today the patient presents to clinic for follow up. Here with daughter. Memory doing a bit better. Has a retained suture in her abdomen that she asked me to remove. No CP or SOB. No LE edema, orthopnea or PND. No dizziness or syncope. No blood in stool or urine. No palpitations. Does have some reflux with meals.  Past Medical History:  Diagnosis Date   Anemia    Arthritis    shoulders   Asthma    mild, no inhalers used   Breast cancer (HCC) 12/18/2011   left breast masectomy=metastatic ca in (1/1) lymph node ,invasive ductal ca,2 foci,,dcis,lymph ovascular invasion identified,surgical resection margins neg for ca,additional tissue=benign skin and subcutaneous tissue   Bronchitis    hx of;last time >58yr ago   Depression    takes Celexa daily   Full dentures    Gall stones 2015   GERD (gastroesophageal reflux disease)    takes Prilosec prn   H/O hiatal hernia    History of kidney stones    many yrs ago   Hx of radiation therapy 04/26/12 -06/10/12   left breast    Hyperlipidemia    takes Simvastatin daily   Hypertension    takes Amlodipine and Diovan daily   Insomnia    takes Ambien nightly   S/P TAVR (transcatheter aortic valve replacement) 11/17/2022   s/p TAVR with a 23 mm Edwards S3UR via the TF approach by Dr. Lynnette Caffey & Dr. Leafy Ro   Severe aortic stenosis    Urinary urgency     Past Surgical History:  Procedure Laterality Date   APPENDECTOMY     BILIARY DILATION  02/07/2022   Procedure: BILIARY DILATION;  Surgeon: Lynann Bologna, MD;  Location: [facility] ENDOSCOPY;  Service: Gastroenterology;;   BILIARY DILATION  03/19/2022   Procedure: BILIARY DILATION;  Surgeon: Lemar Lofty., MD;  Location: [facility] ENDOSCOPY;  Service: Gastroenterology;;   BILIARY STENT PLACEMENT  02/07/2022   Procedure: BILIARY STENT PLACEMENT;  Surgeon: Lynann Bologna, MD;  Location: [facility] ENDOSCOPY;  Service: Gastroenterology;;   BIOPSY  03/19/2022   Procedure: BIOPSY;  Surgeon: Lemar Lofty., MD;  Location: [facility] ENDOSCOPY;  Service: Gastroenterology;;   bladder tack     BREAST BIOPSY  1998   left    DILATION AND CURETTAGE OF UTERUS     ENDOSCOPIC RETROGRADE CHOLANGIOPANCREATOGRAPHY (ERCP) WITH PROPOFOL N/A 02/01/2014   Procedure: ENDOSCOPIC RETROGRADE CHOLANGIOPANCREATOGRAPHY (ERCP) WITH PROPOFOL;  Surgeon: Rachael Fee, MD;  Location: [facility] ENDOSCOPY;  Service: Endoscopy;  Laterality: N/A;   ENDOSCOPIC RETROGRADE CHOLANGIOPANCREATOGRAPHY (ERCP) WITH PROPOFOL N/A 03/19/2022   Procedure: ENDOSCOPIC RETROGRADE CHOLANGIOPANCREATOGRAPHY (ERCP) WITH PROPOFOL;  Surgeon: Meridee Score Netty Starring., MD;  Location: [facility] ENDOSCOPY;  Service: Gastroenterology;  Laterality: N/A;   ERCP N/A 02/07/2022   Procedure: ENDOSCOPIC RETROGRADE CHOLANGIOPANCREATOGRAPHY (ERCP);  Surgeon: Lynann Bologna, MD;  Location: [facility] ENDOSCOPY;  Service: Gastroenterology;  Laterality: N/A;   ESOPHAGOGASTRODUODENOSCOPY     ESOPHAGOGASTRODUODENOSCOPY N/A 06/26/2016   Procedure: ESOPHAGOGASTRODUODENOSCOPY (EGD);   Surgeon: Rachael Fee, MD;  Location: [facility] ENDOSCOPY;  Service: Endoscopy;  Laterality: N/A;   ESOPHAGOGASTRODUODENOSCOPY (EGD) WITH PROPOFOL  12/19/2013   Procedure: ESOPHAGOGASTRODUODENOSCOPY (EGD) WITH PROPOFOL;  Surgeon: Theda Belfast, MD;  Location: [facility] ENDOSCOPY;  Service: Endoscopy;;   ESOPHAGOGASTRODUODENOSCOPY (EGD) WITH PROPOFOL N/A 09/10/2016   Procedure: ESOPHAGOGASTRODUODENOSCOPY (EGD) WITH PROPOFOL;  Surgeon: Rachael Fee, MD;  Location: [facility] ENDOSCOPY;  Service: Endoscopy;  Laterality: N/A;   EUS  06/04/2011   Procedure: UPPER ENDOSCOPIC ULTRASOUND (EUS) LINEAR;  Surgeon: Rob Bunting, MD;  Location: [facility] ENDOSCOPY;  Service: Endoscopy;  Laterality: N/A;   EUS N/A 02/01/2014   Procedure: UPPER ENDOSCOPIC ULTRASOUND (EUS) LINEAR;  Surgeon: Rachael Fee, MD;  Location: [facility] ENDOSCOPY;  Service: Endoscopy;  Laterality: N/A;   EXPLORATORY LAPAROTOMY      biopsy of intra-abdominal mass   INTRAMEDULLARY (IM) NAIL INTERTROCHANTERIC  Right 10/24/2018   Procedure: INTRAMEDULLARY (IM) NAIL INTERTROCHANTRIC;  Surgeon: Ollen Gross, MD;  Location: [facility] ORS;  Service: Orthopedics;  Laterality: Right;   INTRAOPERATIVE TRANSTHORACIC ECHOCARDIOGRAM N/A 11/17/2022   Procedure: INTRAOPERATIVE TRANSTHORACIC ECHOCARDIOGRAM;  Surgeon: Orbie Pyo, MD;  Location: [facility] INVASIVE CV LAB;  Service: Open Heart Surgery;  Laterality: N/A;   LAPAROTOMY N/A 11/17/2016   Procedure: EXPLORATORY LAPAROTOMY WITH LYSIS OF ADHESIONS, SMALL BOWEL RESECTION, REPAIR OF INCARCERATED VENTRAL INCISIONAL HERNIA, INSERTION OF BIOLOGIC MESH PATCH,APPLICATION OF WOUND VAC DRESSING;  Surgeon: Darnell Level, MD;  Location: [facility] ORS;  Service: General;  Laterality: N/A;   MASTECTOMY W/ SENTINEL NODE BIOPSY  12/18/2011   Procedure: MASTECTOMY WITH SENTINEL LYMPH NODE BIOPSY;  Surgeon: Emelia Loron, MD;  Location: [facility] OR;  Service: General;  Laterality: Left;   PORT-A-CATH REMOVAL Right 06/15/2013   Procedure: REMOVAL PORT-A-CATH;   Surgeon: Emelia Loron, MD;  Location: [facility];  Service: General;  Laterality: Right;   PORTACATH PLACEMENT  01/27/2012   Procedure: INSERTION PORT-A-CATH;  Surgeon: Emelia Loron, MD;  Location: [facility];  Service: General;  Laterality: Right;  PORT PLACEMENT   REMOVAL OF STONES  02/07/2022   Procedure: REMOVAL OF STONES;  Surgeon: Lynann Bologna, MD;  Location: [facility] ENDOSCOPY;  Service: Gastroenterology;;   REMOVAL OF STONES  03/19/2022   Procedure: REMOVAL OF STONES;  Surgeon: Lemar Lofty., MD;  Location: [facility]OSCOPY;  Service: Gastroenterology;;   RIGHT/LEFT HEART CATH AND CORONARY ANGIOGRAPHY N/A 05/08/2021   Procedure: RIGHT/LEFT HEART CATH AND CORONARY ANGIOGRAPHY;  Surgeon: Corky Crafts, MD;  Location: [facility] INVASIVE CV LAB;  Service: Cardiovascular;  Laterality: N/A;   SPHINCTEROTOMY  02/07/2022   Procedure: SPHINCTEROTOMY;  Surgeon: Lynann Bologna, MD;  Location: [facility] ENDOSCOPY;  Service: Gastroenterology;;   [place] CHOLANGIOSCOPY N/A 03/19/2022   Procedure: ZOXWRUEA CHOLANGIOSCOPY;  Surgeon: Lemar Lofty., MD;  Location: [facility] ENDOSCOPY;  Service: Gastroenterology;  Laterality: N/A;   STENT REMOVAL  03/19/2022   Procedure: STENT REMOVAL;  Surgeon: Lemar Lofty., MD;  Location: [facility] ENDOSCOPY;  Service: Gastroenterology;;   TOTAL HIP ARTHROPLASTY Right 11/14/2018   Procedure: REMOVAL OF INTRAMEDULLARY NAIL AND POSTERIOR TOTAL HIP ARTHROPLASTY;  Surgeon: Ollen Gross, MD;  Location: [facility] ORS;  Service: Orthopedics;  Laterality: Right;   TOTAL SHOULDER REPLACEMENT  2011   left   TRANSCATHETER AORTIC VALVE REPLACEMENT, TRANSFEMORAL Right 11/17/2022   Procedure: Transcatheter Aortic Valve Replacement, Transfemoral;  Surgeon: Orbie Pyo, MD;  Location: [facility]VASIVE CV LAB;  Service: Open Heart Surgery;  Laterality: Right;   TUBAL LIGATION      Current Medications: Current Meds  Medication Sig   acetaminophen (TYLENOL)  325 MG tablet Take 650 mg by mouth every 6 (six) hours as needed for moderate pain.   alendronate (FOSAMAX) 70 MG tablet Take 70 mg by mouth every Sunday.   aspirin 81 MG chewable tablet Chew 1 tablet (81 mg total) by mouth daily. Okay to swallow whole   atorvastatin (LIPITOR) 40 MG tablet Take 40 mg by mouth at bedtime.   cyanocobalamin (VITAMIN B12) 1000 MCG tablet Take 1,000 mcg by mouth See admin instructions. Take 1000 mcg in the evening every 3 days   DULoxetine (CYMBALTA) 30 MG capsule Take 30 mg by mouth daily.   ferrous sulfate 325 (65 FE) MG tablet Take 1 tablet (325 mg total) by mouth daily. (Patient taking differently: Take 325 mg by mouth 2 (two) times daily.)   gabapentin (NEURONTIN) 300 MG capsule Take  2 capsules (600 mg total) by mouth at bedtime.   Menthol, Topical Analgesic, (BIOFREEZE) 4 % GEL Apply 1 application  topically every 8 (eight) hours as needed (pain).   Multiple Vitamin (MULTIVITAMIN WITH MINERALS) TABS tablet Take 1 tablet by mouth in the morning.   omeprazole (PRILOSEC) 40 MG capsule Take 1 capsule (40 mg total) by mouth daily before breakfast.   rOPINIRole (REQUIP) 0.25 MG tablet Take 0.25 mg by mouth at bedtime.   traMADol (ULTRAM) 50 MG tablet Take 1 tablet (50 mg total) by mouth 4 (four) times daily as needed for moderate pain (pain.). (Patient taking differently: Take 50 mg by mouth in the morning, at noon, and at bedtime.)   valsartan-hydrochlorothiazide (DIOVAN-HCT) 80-12.5 MG tablet Take 1 tablet by mouth in the morning.     Allergies:   Patient has no known allergies.   Social History   Socioeconomic History   Marital status: Widowed    Spouse name: Not on file   Number of children: Not on file   Years of education: Not on file   Highest education level: Not on file  Occupational History   Occupation: retired  Tobacco Use   Smoking status: Never   Smokeless tobacco: Never  Vaping Use   Vaping Use: Never used  Substance and Sexual Activity    Alcohol use: No   Drug use: No   Sexual activity: Yes    Birth control/protection: Surgical    Comment: mensus age 89, 1st pregnancy 89, no hrt gg4,p3, 1 babay lived a few hours complications  Other Topics Concern   Not on file  Social History Narrative   Not on file   Social Determinants of Health   Financial Resource Strain: Not on file  Food Insecurity: No Food Insecurity (03/27/2022)   Hunger Vital Sign    Worried About Running Out of Food in the Last Year: Never true    Ran Out of Food in the Last Year: Never true  Transportation Needs: No Transportation Needs (03/27/2022)   PRAPARE - Administrator, Civil Service (Medical): No    Lack of Transportation (Non-Medical): No  Physical Activity: Unknown (10/29/2018)   Exercise Vital Sign    Days of Exercise per Week: Patient declined    Minutes of Exercise per Session: Patient declined  Stress: Not on file  Social Connections: Unknown (10/29/2018)   Social Connection and Isolation Panel [NHANES]    Frequency of Communication with Friends and Family: Patient declined    Frequency of Social Gatherings with Friends and Family: Patient declined    Attends Religious Services: Patient declined    Database administrator or Organizations: Patient declined    Attends Banker Meetings: Patient declined    Marital Status: Patient declined     Family History: The patient's family history includes Breast cancer in an other family member; Prostate cancer in her father.  ROS:   Please see the history of present illness.    All other systems reviewed and are negative.  EKGs/Labs/Other Studies Reviewed:    Cardiac Studies & Procedures   CARDIAC CATHETERIZATION  CARDIAC CATHETERIZATION 05/08/2021  Narrative   No angiographically apparent coronary artery disease.   Ao sat 94%, PA sat 63%, PA pressure 26/6, mean PA pressure 13 mmHg, mean pulmonary capillary wedge pressure 5 mmHg, cardiac output 5.4 L/min, cardiac  index 3.39.   Severe right subclavian tortuosity.  If cardiac cath was needed in the future, would not use right  radial approach.   Did not cross aortic valve.  Continue with plans for TAVR work-up.  Findings Coronary Findings Diagnostic  Dominance: Right  Left Main Vessel was injected. Vessel is normal in caliber. Vessel is angiographically normal.  Left Anterior Descending Vessel was injected. Vessel is normal in caliber. Vessel is angiographically normal.  Left Circumflex Vessel was injected. Vessel is normal in caliber. Vessel is angiographically normal.  Right Coronary Artery Vessel was injected. Vessel is normal in caliber. Vessel is angiographically normal.  Intervention  No interventions have been documented.   STRESS TESTS  NM MYOCAR MULTI W/SPECT W 11/17/2016  Narrative CLINICAL DATA:  Hypertension, asthma, abdominal pain. Preop for surgery.  EXAM: MYOCARDIAL IMAGING WITH SPECT (REST AND PHARMACOLOGIC-STRESS)  GATED LEFT VENTRICULAR WALL MOTION STUDY  LEFT VENTRICULAR EJECTION FRACTION  TECHNIQUE: Standard myocardial SPECT imaging was performed after resting intravenous injection of 10 mCi Tc-83m tetrofosmin. Subsequently, intravenous infusion of Lexiscan was performed under the supervision of the Cardiology staff. At peak effect of the drug, 30 mCi Tc-64m tetrofosmin was injected intravenously and standard myocardial SPECT imaging was performed. Quantitative gated imaging was also performed to evaluate left ventricular wall motion, and estimate left ventricular ejection fraction.  COMPARISON:  None.  FINDINGS: Perfusion: Decreased activity in the inferior wall on both sets of images likely due to scar. No reversible defects to suggest ischemia.  Wall Motion: Decrease thickening and activity in the inferior wall. The remainder the left ventricle demonstrates normal wall motion with an myocardial thickening with contraction.  Left Ventricular  Ejection Fraction: 71 %  End diastolic volume 66 ml  End systolic volume 19 ml  IMPRESSION: 1. Suspect inferior wall scar/infarction. No findings for ischemia.  2. Normal left ventricular wall motion except for the inferior wall.  3. Left ventricular ejection fraction 71%  4. Non invasive risk stratification*: Intermediate  *2012 Appropriate Use Criteria for Coronary Revascularization Focused Update: J Am Coll Cardiol. 2012;59(9):857-881. http://content.dementiazones.com.aspx?articleid=1201161   Electronically Signed By: Rudie Meyer M.D. On: 11/17/2016 12:28   ECHOCARDIOGRAM  ECHOCARDIOGRAM COMPLETE 12/25/2022  Narrative ECHOCARDIOGRAM REPORT    Patient Name:   [name] Date of Exam: 12/25/2022 Medical Rec #:  [id number]       Height:       60.0 in Accession #:    [id number]      Weight:       138.2 lb Date of Birth:  05-25-1933        BSA:          1.595 m Patient Age:    89 years        BP:           134/60 mmHg Patient Gender: F               HR:           62 bpm. Exam Location:  [address]  Procedure: 2D Echo, Cardiac Doppler and Color Doppler  Indications:    Z95.2 S/p TAVR  History:        Patient has prior history of Echocardiogram examinations, most recent 11/18/2022. CHF, Abnormal ECG, MAC, S/p TAVR (23mm Melodye Ped) and Mitral Stenosis; Risk Factors:Hypertension and Dyslipidemia. Aortic Valve: valve is present in the aortic position.  Sonographer:    Samule Ohm RDCS Referring Phys: [id number] Hae Ahlers R Zakariyah Freimark  IMPRESSIONS   1. Left ventricular ejection fraction, by estimation, is 60 to 65%. The left ventricle has normal function. The left ventricle has no regional wall  motion abnormalities. There is mild concentric left ventricular hypertrophy. Left ventricular diastolic parameters are consistent with Grade I diastolic dysfunction (impaired relaxation). 2. Right ventricular systolic function is normal. The right ventricular size  is normal. 3. Left atrial size was moderately dilated. 4. The mitral valve is degenerative. No evidence of mitral valve regurgitation. Mild to moderate mitral stenosis. The mean mitral valve gradient is 3.5 mmHg with average heart rate of 61 bpm. Severe mitral annular calcification. 5. The aortic valve has been repaired/replaced. Aortic valve regurgitation is mild. No aortic stenosis is present. There is a valve present in the aortic position. Echo findings are consistent with perivalvular leak of the aortic prosthesis. Aortic regurgitation PHT measures 456 msec. Aortic valve mean gradient measures 10.6 mmHg. Aortic valve Vmax measures 2.38 m/s. Aortic valve acceleration time measures 85 msec.  Comparison(s): No significant change from prior study. Prior images reviewed side by side. TAVR gradients are unchanged. The mild perivalvular leak was also present on the previous study. The mitral gradients are lower due to a slower heart rate.  FINDINGS Left Ventricle: Left ventricular ejection fraction, by estimation, is 60 to 65%. The left ventricle has normal function. The left ventricle has no regional wall motion abnormalities. The left ventricular internal cavity size was normal in size. There is mild concentric left ventricular hypertrophy. Left ventricular diastolic parameters are consistent with Grade I diastolic dysfunction (impaired relaxation).  Right Ventricle: The right ventricular size is normal. No increase in right ventricular wall thickness. Right ventricular systolic function is normal.  Left Atrium: Left atrial size was moderately dilated.  Right Atrium: Right atrial size was normal in size.  Pericardium: There is no evidence of pericardial effusion.  Mitral Valve: The mitral valve is degenerative in appearance. Severe mitral annular calcification. No evidence of mitral valve regurgitation. Mild to moderate mitral valve stenosis. MV peak gradient, 11.6 mmHg. The mean mitral valve  gradient is 3.5 mmHg with average heart rate of 61 bpm.  Tricuspid Valve: The tricuspid valve is normal in structure. Tricuspid valve regurgitation is trivial.  Aortic Valve: The aortic valve has been repaired/replaced. Aortic valve regurgitation is mild. Aortic regurgitation PHT measures 456 msec. No aortic stenosis is present. Aortic valve mean gradient measures 10.6 mmHg. Aortic valve peak gradient measures 22.7 mmHg. Aortic valve area, by VTI measures 1.70 cm. There is a valve present in the aortic position.  Pulmonic Valve: The pulmonic valve was grossly normal. Pulmonic valve regurgitation is not visualized.  Aorta: The aortic root and ascending aorta are structurally normal, with no evidence of dilitation.  IAS/Shunts: No atrial level shunt detected by color flow Doppler.   LEFT VENTRICLE PLAX 2D LVIDd:         4.40 cm LVIDs:         3.00 cm LV PW:         1.10 cm LV IVS:        1.40 cm LVOT diam:     2.10 cm LV SV:         87 LV SV Index:   55 LVOT Area:     3.46 cm   RIGHT VENTRICLE RV S prime:     12.73 cm/s TAPSE (M-mode): 1.5 cm RVSP:           23.8 mmHg  LEFT ATRIUM             Index        RIGHT ATRIUM  Index LA diam:        4.50 cm 2.82 cm/m   RA Pressure: 3.00 mmHg LA Vol (A2C):   80.8 ml 50.65 ml/m  RA Area:     14.10 cm LA Vol (A4C):   66.1 ml 41.44 ml/m  RA Volume:   31.10 ml  19.50 ml/m LA Biplane Vol: 73.3 ml 45.95 ml/m AORTIC VALVE AV Area (Vmax):    1.53 cm AV Area (Vmean):   1.67 cm AV Area (VTI):     1.70 cm AV Vmax:           238.40 cm/s AV Vmean:          148.400 cm/s AV VTI:            0.514 m AV Peak Grad:      22.7 mmHg AV Mean Grad:      10.6 mmHg LVOT Vmax:         105.60 cm/s LVOT Vmean:        71.660 cm/s LVOT VTI:          0.253 m LVOT/AV VTI ratio: 0.49 AI PHT:            456 msec  AORTA Ao Root diam: 3.10 cm Ao Asc diam:  3.60 cm  MITRAL VALVE                TRICUSPID VALVE MV Area (PHT): 1.86 cm     TR  Peak grad:   20.8 mmHg MV Peak grad:  11.6 mmHg    TR Vmax:        228.00 cm/s MV Mean grad:  3.5 mmHg     Estimated RAP:  3.00 mmHg MV Vmax:       1.70 m/s     RVSP:           23.8 mmHg MV Vmean:      90.7 cm/s MV Decel Time: 407 msec     SHUNTS MV E velocity: 129.20 cm/s  Systemic VTI:  0.25 m MV A velocity: 172.60 cm/s  Systemic Diam: 2.10 cm MV E/A ratio:  0.75  Mihai Croitoru MD Electronically signed by Thurmon Fair MD Signature Date/Time: 12/25/2022/12:28:31 PM    Final     CT SCANS  CT CORONARY MORPH W/CTA COR W/SCORE 03/16/2022  Addendum 03/16/2022  3:28 PM ADDENDUM REPORT: 03/16/2022 15:26  CLINICAL DATA:  Aortic valve replacement (TAVR), pre-op eval  EXAM: Cardiac TAVR CT  TECHNIQUE: The patient was scanned on a Siemens Force 192 slice scanner. A 120 kV retrospective scan was triggered in the descending thoracic aorta at 111 HU's. Gantry rotation speed was 270 msecs and collimation was .9 mm. The 3D data set was reconstructed in 5% intervals of the R-R cycle. Systolic and diastolic phases were analyzed on a dedicated work station using MPR, MIP and VRT modes. The patient received OMNIPAQUE IOHEXOL 350 MG/ML SOLN of contrast.  FINDINGS: Aortic Valve:  Tricuspid aortic valve. Severely reduced cusp separation. Moderately thickened, severely calcified aortic valve cusps.  AV calcium score: 2283  Virtual Basal Annulus Measurements:  Maximum/Minimum Diameter: 27.5 x 20.8 mm  Perimeter: 76.3 mm  Area: 439 mm2  Mild LVOT calcifications under NCC/LCC commissure.  Membranous septal length: 4.5 mm  Based on these measurements, the annulus would be suitable for a 26 mm Edward Sapien 3 valve. Alternatively, consider a 29 mm Medtronic Evolut Pro valve. Recommend structural heart team discussion for valve selection.  Sinus of Valsalva Measurements:  Non-coronary:  32 mm  Right - coronary:  29 mm  Left - coronary:  30 mm  Sinus of Valsalva  Height:  Left: 23.6 mm  Right: 22.5 mm  Aorta: Short segment common origin of the brachiocephalic and left common carotid arteries off the aortic arch.  Sinotubular Junction:  28 mm  Ascending Thoracic Aorta:  29 mm  Aortic Arch:  25 mm  Descending Thoracic Aorta:  25 mm  Coronary Artery Height above Annulus:  Left main: 19.5 mm  Right coronary: 19.1 mm  Coronary Arteries: Normal coronary origin. Right dominance. The study was performed without use of NTG and insufficient for plaque evaluation. Coronary artery calcium seen in 3 vessel distribution.  Optimum Fluoroscopic Angle for Delivery: LAO 9 CAU 9  OTHER:  Left atrial appendage: No thrombus.  Mitral valve: Thickened leaflets, severe mitral annular calcifications.  Pulmonary artery: Mildly dilated main and branch pulmonary arteries.  Pulmonary veins: Normal anatomy.  IMPRESSION: 1. Tricuspid aortic valve with severely reduced cusp excursion. Moderately thickened and severely calcified aortic valve cusps.  2.  Aortic valve calcium score: 2283  3. Annulus area: 439 mm2, suitable for 26 mm Sapien 3 valve. Mild LVOT calcifications.  4.  Sufficient coronary artery heights from annulus.  5.  Optimum Fluoroscopic Angle for Delivery: LAO 9 CAU 9  6.  Severe mitral annular calcification.   Electronically Signed By: Weston Brass M.D. On: 03/16/2022 15:26  Narrative EXAM: OVER-READ INTERPRETATION  CT CHEST  The following report is a limited chest CT over-read performed by radiologist Dr. Cleone Slim of [facility] on 03/16/2022. This over-read does not include interpretation of cardiac or coronary anatomy or pathology. The cardiac CTA interpretation by the cardiologist is attached.  COMPARISON:  02/06/2022 CT angiogram of the chest, abdomen and pelvis.  FINDINGS: Please see the separate concurrent chest CT angiogram report for details.  IMPRESSION: Please see the separate concurrent  chest CT angiogram report for details.  Electronically Signed: By: Delbert Phenix M.D. On: 03/16/2022 13:54          EKG:  EKG is NOT ordered today.   Recent Labs: 02/07/2022: B Natriuretic Peptide 650.5 11/13/2022: ALT 17 11/18/2022: Magnesium 1.4 11/27/2022: BUN 32; Creatinine, Ser 1.20; Hemoglobin 8.8; Platelets 158; Potassium 4.1; Sodium 142  Recent Lipid Panel    Component Value Date/Time   TRIG 295 (H) 10/29/2018 1340     Risk Assessment/Calculations:       Physical Exam:    VS:  BP (!) 136/54   Pulse 62   Ht 5\' 2"  (1.575 m)   Wt 139 lb 3.2 oz (63.1 kg)   SpO2 96%   BMI 25.46 kg/m     Wt Readings from Last 3 Encounters:  12/25/22 139 lb 3.2 oz (63.1 kg)  11/27/22 138 lb 3.2 oz (62.7 kg)  11/18/22 143 lb 15.4 oz (65.3 kg)     GEN: elderly and frail appearing. HEENT: Normal NECK: No JVD LYMPHATICS: No lymphadenopathy CARDIAC: RRR, no murmurs, rubs, gallops RESPIRATORY:  Clear to auscultation without rales, wheezing or rhonchi  ABDOMEN: Soft, non-tender, non-distended MUSCULOSKELETAL:  No edema; No deformity  SKIN: Warm and dry.   NEUROLOGIC:  Alert and oriented x 3 PSYCHIATRIC:  Normal affect   ASSESSMENT:    1. S/P TAVR (transcatheter aortic valve replacement)   2. Anemia of chronic disease   3. Chronic diastolic CHF (congestive heart failure) (HCC)   4. Nonrheumatic mitral valve stenosis   5. Stage 3a chronic kidney disease (  HCC)   6. Essential hypertension   7. Gallbladder disease     PLAN:    In order of problems listed above:  Severe AS s/p TAVR: echo today shows EF 60%, normally functioning TAVR with a mean gradient of 10 mm hg and mild PVL. She has NYHA class I symptoms, but very sedentary. Continue baby Asprin 81 mg daily. SBE prophylaxis discussed; the patient is edentulous and does not go to the dentist. I will see her back in 1 year for follow up and echo   Acute post op anemia: Hg dropped to 5.4. She was treated with 1U PRBCs and Hg  improved to 10.7. Repeat Hg 8.8 at last visit.    Chronic diastolic CHF: appears euvolemic. Continue Diovan-HCT.    Severe MAC with mitral stenosis: mean mitral gradient 3.63mm hg at HR 61. Continue to follow   CKD stage IIIa: creat 1.2 last check.    HTN: BP has been well controlled. No changes made  Cholecystitis with hepatic bile duct dilatation: previously treated with antibiotics. Laparoscopic cholecystectomy was deferred at that time given severe AS. She had a retained suture in her abdomen which I removed for her today and bandaged. She is having some worsening GERD. Follows with GI in the near future and would be cleared for any GI surgery necessary from a cardiology standpoint.   Medication Adjustments/Labs and Tests Ordered: Current medicines are reviewed at length with the patient today.  Concerns regarding medicines are outlined above.  No orders of the defined types were placed in this encounter.  No orders of the defined types were placed in this encounter.   Patient Instructions  Medication Instructions:  Your physician recommends that you continue on your current medications as directed. Please refer to the Current Medication list given to you today.  *If you need a refill on your cardiac medications before your next appointment, please call your pharmacy*   Lab Work: NONE If you have labs (blood work) drawn today and your tests are completely normal, you will receive your results only by: MyChart Message (if you have MyChart) OR A paper copy in the mail If you have any lab test that is abnormal or we need to change your treatment, we will call you to review the results.   Testing/Procedures: NONE   Follow-Up: At [facility], you and your health needs are our priority.  As part of our continuing mission to provide you with exceptional heart care, we have created designated Provider Care Teams.  These Care Teams include your primary Cardiologist  (physician) and Advanced Practice Providers (APPs -  Physician Assistants and Nurse Practitioners) who all work together to provide you with the care you need, when you need it.  We recommend signing up for the patient portal called "MyChart".  Sign up information is provided on this After Visit Summary.  MyChart is used to connect with patients for Virtual Visits (Telemedicine).  Patients are able to view lab/test results, encounter notes, upcoming appointments, etc.  Non-urgent messages can be sent to your provider as well.   To learn more about what you can do with MyChart, go to [email or web address].    Your next appointment:   KEEP SCHEDULED FOLLOW-UP   Signed, Cline Crock, PA-C  12/25/2022 12:50 PM    [facility]

## 2022-12-25 NOTE — Patient Instructions (Signed)
Medication Instructions:  Your physician recommends that you continue on your current medications as directed. Please refer to the Current Medication list given to you today.  *If you need a refill on your cardiac medications before your next appointment, please call your pharmacy*   Lab Work: NONE If you have labs (blood work) drawn today and your tests are completely normal, you will receive your results only by: MyChart Message (if you have MyChart) OR A paper copy in the mail If you have any lab test that is abnormal or we need to change your treatment, we will call you to review the results.   Testing/Procedures: NONE   Follow-Up: At [facility], you and your health needs are our priority.  As part of our continuing mission to provide you with exceptional heart care, we have created designated Provider Care Teams.  These Care Teams include your primary Cardiologist (physician) and Advanced Practice Providers (APPs -  Physician Assistants and Nurse Practitioners) who all work together to provide you with the care you need, when you need it.  We recommend signing up for the patient portal called "MyChart".  Sign up information is provided on this After Visit Summary.  MyChart is used to connect with patients for Virtual Visits (Telemedicine).  Patients are able to view lab/test results, encounter notes, upcoming appointments, etc.  Non-urgent messages can be sent to your provider as well.   To learn more about what you can do with MyChart, go to [email or web address].    Your next appointment:   KEEP SCHEDULED FOLLOW-UP 

## 2023-04-12 ENCOUNTER — Ambulatory Visit: Payer: Medicare (Managed Care) | Admitting: Internal Medicine

## 2023-04-20 ENCOUNTER — Ambulatory Visit: Payer: Medicare (Managed Care) | Admitting: Internal Medicine

## 2023-06-14 ENCOUNTER — Other Ambulatory Visit: Payer: Self-pay | Admitting: Physician Assistant

## 2023-06-14 DIAGNOSIS — Z952 Presence of prosthetic heart valve: Secondary | ICD-10-CM

## 2023-06-24 ENCOUNTER — Ambulatory Visit: Payer: Medicare (Managed Care) | Admitting: Physician Assistant

## 2023-07-24 NOTE — Progress Notes (Unsigned)
Brandy [name] MRN: [id number] DOB/AGE: [birth year] 89 y.o.  Primary Care Physician:Miller, Misty Stanley, MD Primary Cardiologist: Nathaniel Man, MD   PROBLEM LIST: 1.  AS s/p TAVR 23 S3 May 2024 2.  Hypertension 3.  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  Frailty and ambulates with walker 5.  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  CKD stage IIIa 7.  Iron deficiency anemia 8.  Mitral stenosis with MAC  -Mild TTE May 2024   HISTORY OF PRESENT ILLNESS:  November 2023: The patient returns for follow-up.  I saw her last in the end of August.  At that point in time she was more dyspneic and was more amenable to pursuing potential aortic valve intervention.  She was referred for CT scan.  Unfortunately she was admitted about a month ago with pubic ramus fracture.  Apparently she tripped when she was ambulating with her walker.  She fell backwards hitting her right hip and buttocks but did not hit her head.  She was evaluated by orthopedic surgery and no acute surgical intervention was required.  Unfortunately because she was ambulating awkwardly she fractured her right hip as well and this was realized last week.  Because of this she is on strict bedrest to allow all of her fractures to heal before she rehabilitates.  In regards to cardiovascular symptoms she denies any shortness of breath again because she is not ambulating very much.  She denies any exertional angina.  She has had no severe peripheral edema, paroxysmal nocturnal dyspnea, orthopnea.  She has been relatively stable from a cardiovascular standpoint.  Plan: Defer aortic valve intervention for now due to allow recuperation from orthopedic issues; follow-up in 2 months.  January 2024: Patient returns for routine follow-up.  She unfortunately has suffered a few more falls not due to dizziness.  She also developed a neck fracture in early  December.  She did not need hospitalization for this and is in a neck brace.  She is here with her daughter.  She is at a rehab facility.  She does not do much in a day.  She is unsteady on her feet and in wheelchair most of the day.  She denies any shortness of breath or chest discomfort.  Plan:  Monitor, allow for recuperation.  4/24: The patient is seen for expedited follow-up after presenting to [facility] 4 days ago with chest pain.  The patient developed epigastric discomfort after eating.  She felt as if it was like heartburn and after burping it subsided.  She was administered aspirin and nitroglycerin and her pain resolved completely after this.  Her high-sensitivity troponins are flat.  Her EKG demonstrated sinus rhythm with PACs and left bundle branch block.  She was discharged home.    The patient is here with her daughter today.  She is recuperated from her various fractures.  She is now at an assisted living facility.  She is mostly confined to a wheelchair however she can walk on occasion.  She notices that she has had increasing shortness of breath when going from her wheelchair to the bathroom.  This is occurred over the last month or 2.  She fortunately denies any presyncope or syncope.  She denies any exertional angina.  She has had no bleeding or bruising episodes.  She has had no signs or symptoms of stroke.  Plan: Evaluate for TAVR.  1/25: Since I saw her last the patient underwent uncomplicated TAVR in May 2024.  She was seen at 1 month follow-up.  She was doing well, was found to be euvolemic, and was without cardiovascular complaints.  Past Medical History:  Diagnosis Date   Anemia    Arthritis    shoulders   Asthma    mild, no inhalers used   Breast cancer (HCC) 12/18/2011   left breast masectomy=metastatic ca in (1/1) lymph node ,invasive ductal ca,2 foci,,dcis,lymph ovascular invasion identified,surgical resection margins neg for ca,additional  tissue=benign skin and subcutaneous tissue   Bronchitis    hx of;last time >41yr ago   Depression    takes Celexa daily   Full dentures    Gall stones 2015   GERD (gastroesophageal reflux disease)    takes Prilosec prn   H/O hiatal hernia    History of kidney stones    many yrs ago   Hx of radiation therapy 04/26/12 -06/10/12   left breast   Hyperlipidemia    takes Simvastatin daily   Hypertension    takes Amlodipine and Diovan daily   Insomnia    takes Ambien nightly   S/P TAVR (transcatheter aortic valve replacement) 11/17/2022   s/p TAVR with a 23 mm Edwards S3UR via the TF approach by Dr. Lynnette Caffey & Dr. Leafy Ro   Severe aortic stenosis    Urinary urgency     Past Surgical History:  Procedure Laterality Date   APPENDECTOMY     BILIARY DILATION  02/07/2022   Procedure: BILIARY DILATION;  Surgeon: Lynann Bologna, MD;  Location: [facility] ENDOSCOPY;  Service: Gastroenterology;;   BILIARY DILATION  03/19/2022   Procedure: BILIARY DILATION;  Surgeon: Lemar Lofty., MD;  Location: [facility] ENDOSCOPY;  Service: Gastroenterology;;   BILIARY STENT PLACEMENT  02/07/2022   Procedure: BILIARY STENT PLACEMENT;  Surgeon: Lynann Bologna, MD;  Location: [facility] ENDOSCOPY;  Service: Gastroenterology;;   BIOPSY  03/19/2022   Procedure: BIOPSY;  Surgeon: Lemar Lofty., MD;  Location: [facility] ENDOSCOPY;  Service: Gastroenterology;;   bladder tack     BREAST BIOPSY  1998   left    DILATION AND CURETTAGE OF UTERUS     ENDOSCOPIC RETROGRADE CHOLANGIOPANCREATOGRAPHY (ERCP) WITH PROPOFOL N/A 02/01/2014   Procedure: ENDOSCOPIC RETROGRADE CHOLANGIOPANCREATOGRAPHY (ERCP) WITH PROPOFOL;  Surgeon: Rachael Fee, MD;  Location: [facility] ENDOSCOPY;  Service: Endoscopy;  Laterality: N/A;   ENDOSCOPIC RETROGRADE CHOLANGIOPANCREATOGRAPHY (ERCP) WITH PROPOFOL N/A 03/19/2022   Procedure: ENDOSCOPIC RETROGRADE CHOLANGIOPANCREATOGRAPHY (ERCP) WITH PROPOFOL;  Surgeon: Meridee Score Netty Starring., MD;  Location: [facility] ENDOSCOPY;   Service: Gastroenterology;  Laterality: N/A;   ERCP N/A 02/07/2022   Procedure: ENDOSCOPIC RETROGRADE CHOLANGIOPANCREATOGRAPHY (ERCP);  Surgeon: Lynann Bologna, MD;  Location: [facility] ENDOSCOPY;  Service: Gastroenterology;  Laterality: N/A;   ESOPHAGOGASTRODUODENOSCOPY     ESOPHAGOGASTRODUODENOSCOPY N/A 06/26/2016   Procedure: ESOPHAGOGASTRODUODENOSCOPY (EGD);  Surgeon: Rachael Fee, MD;  Location: [facility] ENDOSCOPY;  Service: Endoscopy;  Laterality: N/A;   ESOPHAGOGASTRODUODENOSCOPY (EGD) WITH PROPOFOL  12/19/2013   Procedure: ESOPHAGOGASTRODUODENOSCOPY (EGD) WITH PROPOFOL;  Surgeon: Theda Belfast, MD;  Location: [facility] ENDOSCOPY;  Service: Endoscopy;;   ESOPHAGOGASTRODUODENOSCOPY (EGD) WITH PROPOFOL N/A 09/10/2016   Procedure: ESOPHAGOGASTRODUODENOSCOPY (EGD) WITH PROPOFOL;  Surgeon: Rachael Fee, MD;  Location: [facility] ENDOSCOPY;  Service: Endoscopy;  Laterality: N/A;   EUS  06/04/2011   Procedure: UPPER ENDOSCOPIC ULTRASOUND (EUS) LINEAR;  Surgeon: Rob Bunting, MD;  Location: [facility] ENDOSCOPY;  Service: Endoscopy;  Laterality: N/A;   EUS N/A 02/01/2014   Procedure: UPPER ENDOSCOPIC  ULTRASOUND (EUS) LINEAR;  Surgeon: Rachael Fee, MD;  Location: [facility] ENDOSCOPY;  Service: Endoscopy;  Laterality: N/A;   EXPLORATORY LAPAROTOMY      biopsy of intra-abdominal mass   INTRAMEDULLARY (IM) NAIL INTERTROCHANTERIC Right 10/24/2018   Procedure: INTRAMEDULLARY (IM) NAIL INTERTROCHANTRIC;  Surgeon: Ollen Gross, MD;  Location: [facility] ORS;  Service: Orthopedics;  Laterality: Right;   INTRAOPERATIVE TRANSTHORACIC ECHOCARDIOGRAM N/A 11/17/2022   Procedure: INTRAOPERATIVE TRANSTHORACIC ECHOCARDIOGRAM;  Surgeon: Orbie Pyo, MD;  Location: [facility] INVASIVE CV LAB;  Service: Open Heart Surgery;  Laterality: N/A;   LAPAROTOMY N/A 11/17/2016   Procedure: EXPLORATORY LAPAROTOMY WITH LYSIS OF ADHESIONS, SMALL BOWEL RESECTION, REPAIR OF INCARCERATED VENTRAL INCISIONAL HERNIA, INSERTION OF BIOLOGIC MESH PATCH,APPLICATION OF WOUND VAC  DRESSING;  Surgeon: Darnell Level, MD;  Location: [facility] ORS;  Service: General;  Laterality: N/A;   MASTECTOMY W/ SENTINEL NODE BIOPSY  12/18/2011   Procedure: MASTECTOMY WITH SENTINEL LYMPH NODE BIOPSY;  Surgeon: Emelia Loron, MD;  Location: [facility] OR;  Service: General;  Laterality: Left;   PORT-A-CATH REMOVAL Right 06/15/2013   Procedure: REMOVAL PORT-A-CATH;  Surgeon: Emelia Loron, MD;  Location: [facility];  Service: General;  Laterality: Right;   PORTACATH PLACEMENT  01/27/2012   Procedure: INSERTION PORT-A-CATH;  Surgeon: Emelia Loron, MD;  Location: [facility];  Service: General;  Laterality: Right;  PORT PLACEMENT   REMOVAL OF STONES  02/07/2022   Procedure: REMOVAL OF STONES;  Surgeon: Lynann Bologna, MD;  Location: [facility] ENDOSCOPY;  Service: Gastroenterology;;   REMOVAL OF STONES  03/19/2022   Procedure: REMOVAL OF STONES;  Surgeon: Lemar Lofty., MD;  Location: [facility] ENDOSCOPY;  Service: Gastroenterology;;   RIGHT/LEFT HEART CATH AND CORONARY ANGIOGRAPHY N/A 05/08/2021   Procedure: RIGHT/LEFT HEART CATH AND CORONARY ANGIOGRAPHY;  Surgeon: Corky Crafts, MD;  Location: [facility] INVASIVE CV LAB;  Service: Cardiovascular;  Laterality: N/A;   SPHINCTEROTOMY  02/07/2022   Procedure: SPHINCTEROTOMY;  Surgeon: Lynann Bologna, MD;  Location: [facility] ENDOSCOPY;  Service: Gastroenterology;;   Bur[facility] Fre[facility] CHOLANGIOSCOPY N/A 03/19/2022   Procedure: [id number] CHOLANGIOSCOPY;  Surgeon: Lemar Lofty., MD;  Location: [facility] ENDOSCOPY;  Service: Gastroenterology;  Laterality: N/A;   STENT REMOVAL  03/19/2022   Procedure: STENT REMOVAL;  Surgeon: Lemar Lofty., MD;  Location: [facility] ENDOSCOPY;  Service: Gastroenterology;;   TOTAL HIP ARTHROPLASTY Right 11/14/2018   Procedure: REMOVAL OF INTRAMEDULLARY NAIL AND POSTERIOR TOTAL HIP ARTHROPLASTY;  Surgeon: Ollen Gross, MD;  Location: [facility] ORS;  Service: Orthopedics;  Laterality: Right;   TOTAL SHOULDER REPLACEMENT  2011    left   TRANSCATHETER AORTIC VALVE REPLACEMENT, TRANSFEMORAL Right 11/17/2022   Procedure: Transcatheter Aortic Valve Replacement, Transfemoral;  Surgeon: Orbie Pyo, MD;  Location: [facility] INVASIVE CV LAB;  Service: Open Heart Surgery;  Laterality: Right;   TUBAL LIGATION      Family History  Problem Relation Age of Onset   Prostate cancer Father    Breast cancer Other     Social History   Socioeconomic History   Marital status: Widowed    Spouse name: Not on file   Number of children: Not on file   Years of education: Not on file   Highest education level: Not on file  Occupational History   Occupation: retired  Tobacco Use   Smoking status: Never   Smokeless tobacco: Never  Vaping Use   Vaping status: Never Used  Substance and Sexual Activity   Alcohol use: No   Drug use: No   Sexual activity:  Yes    Birth control/protection: Surgical    Comment: mensus age 36, 1st pregnancy 78, no hrt gg4,p3, 1 babay lived a few hours complications  Other Topics Concern   Not on file  Social History Narrative   Not on file   Social Drivers of Health   Financial Resource Strain: Low Risk  (10/26/2022)   Received from [facility]   Overall Financial Resource Strain (CARDIA)    Difficulty of Paying Living Expenses: Not hard at all  Food Insecurity: No Food Insecurity (10/26/2022)   Received [facility], [facility]   Hunger Vital Sign    Worried About Running Out of Food in the Last Year: Never true    Ran Out of Food in the Last Year: Never true  Transportation Needs: No Transportation Needs (10/26/2022)   Received from [facility], [facility]   PRAPARE - Transportation    Lack of Transportation (Medical): No    Lack of Transportation (Non-Medical): No  Physical Activity: Unknown (10/29/2018)   Exercise Vital Sign    Days of Exercise per Week: Patient declined    Minutes of Exercise per Session: Patient declined  Stress: Not on file  Social  Connections: Unknown (06/04/2022)   Received from [facility], [facility]   Social Network    Social Network: Not on file  Intimate Partner Violence: Unknown (06/04/2022)   Received from [facility], [facility]   HITS    Physically Hurt: Not on file    Insult or Talk Down To: Not on file    Threaten Physical Harm: Not on file    Scream or Curse: Not on file     Prior to Admission medications   Medication Sig Start Date End Date Taking? Authorizing Provider  acetaminophen (TYLENOL) 500 MG tablet Take 1,000 mg by mouth every 6 (six) hours as needed (pain).    [provider]  alendronate (FOSAMAX) 70 MG tablet Take 70 mg by mouth once a week. sunday 04/01/21   [provider]  amoxicillin-clavulanate (AUGMENTIN) 875-125 MG tablet Take 1 tablet by mouth 2 (two) times daily for 3 days. 05/09/21 05/12/21  Lurene Shadow, MD  atorvastatin (LIPITOR) 40 MG tablet Take 40 mg by mouth daily.  04/27/16   [provider]  Cyanocobalamin (VITAMIN B-12 PO) Take 1 tablet by mouth daily.    [provider]  DULoxetine (CYMBALTA) 20 MG capsule Take 20 mg by mouth 2 (two) times daily. 04/17/21   [provider]  ferrous sulfate 325 (65 FE) MG tablet Take 1 tablet (325 mg total) by mouth daily. 05/09/21   Lurene Shadow, MD  gabapentin (NEURONTIN) 300 MG capsule TAKE 1 CAPSULE BY MOUTH EVERYDAY AT BEDTIME Patient taking differently: Take 300 mg by mouth at bedtime. 11/08/20   Serena Croissant, MD  Multiple Vitamin (MULTIVITAMIN WITH MINERALS) TABS tablet Take 1 tablet by mouth daily.    [provider]  omeprazole (PRILOSEC) 40 MG capsule Take 1 capsule (40 mg total) by mouth daily as needed (acid reflux). 05/09/21   Lurene Shadow, MD  traMADol (ULTRAM) 50 MG tablet Take 2 tablets (100 mg total) by mouth 4 (four) times daily. 11/16/18   Edmisten, Kristie L, PA  valsartan-hydrochlorothiazide (DIOVAN-HCT) 80-12.5 MG tablet Take 1 tablet by mouth daily.     [provider]  Vitamin D, Ergocalciferol, (DRISDOL) 50000 units CAPS capsule Take 50,000 Units by mouth every 7 (seven) days. Sunday    [provider]  No Known Allergies  REVIEW OF SYSTEMS:  General: no fevers/chills/night sweats Eyes: no blurry vision, diplopia, or amaurosis ENT: no sore throat or hearing loss Resp: no cough, wheezing, or hemoptysis CV: no edema or palpitations GI: no abdominal pain, nausea, vomiting, diarrhea, or constipation GU: no dysuria, frequency, or hematuria Skin: no rash Neuro: no headache, numbness, tingling, or weakness of extremities Musculoskeletal: no joint pain or swelling Heme: no bleeding, DVT, or easy bruising Endo: no polydipsia or polyuria  There were no vitals taken for this visit.  PHYSICAL EXAM: GEN:  AO x 3 in no acute distress, frail HEENT: normal Dentition: Has both upper and lower dentures Neck: JVP normal. +1 carotid upstrokes without bruits. No thyromegaly. Lungs: equal expansion, clear bilaterally CV: Apex is discrete and nondisplaced, RRR with 4/6 SEM Abd: soft, non-tender, non-distended; no bruit; positive bowel sounds Ext: no edema, ecchymoses, or cyanosis Vascular: 2+ femoral pulses, 2+ radial pulses       Skin: warm and dry without rash Neuro: CN II-XII grossly intact; motor and sensory grossly intact    DATA AND STUDIES:  EKG: Normal sinus rhythm with left ventricular hypertrophy and strain  2D ECHO: August 2023: Severely calcified aortic valve with moderate to severe aortic stenosis with mean gradient 31 mmHg and moderate mitral stenosis with severe MAC with a mean gradient of 9 mmHg with ejection fraction of 60 to 65%  TAVR CTA: 1. Tricuspid aortic valve with severely reduced cusp excursion. Moderately thickened and severely calcified aortic valve cusps. 2.  Aortic valve calcium score: 2283 3. Annulus area: 439 mm2, suitable for 26 mm Sapien 3 valve. Mild LVOT calcifications. 4.   Sufficient coronary artery heights from annulus. 5.  Optimum Fluoroscopic Angle for Delivery: LAO 9 CAU 9 6.  Severe mitral annular calcification.  CARDIAC CATH: November 2022 minimal obstructive coronary artery disease with right dominant system  STS RISK CALCULATOR: 6.7%  NHYA CLASS: 2  CCS 1    ASSESSMENT AND PLAN:   Status post TAVR: Continue aspirin and SBE prophylaxis.  1 month TAVR echo was reassuring.  Chronic diastolic heart failure:***  Mitral stenosis: Mild mitral stenosis; monitor for now.  CKD stage IIIa: Monitor for now.  Hypertension:***     Orbie Pyo, MD  07/24/2023 1:06 PM    [facility] [address] [place], [place], [place]  [address] Phone: [phone]; Fax: [phone]

## 2023-07-29 ENCOUNTER — Encounter: Payer: Self-pay | Admitting: Internal Medicine

## 2023-07-29 ENCOUNTER — Ambulatory Visit: Payer: Medicare (Managed Care) | Attending: Internal Medicine | Admitting: Internal Medicine

## 2023-07-29 VITALS — BP 160/58 | HR 79 | Ht 62.0 in | Wt 134.4 lb

## 2023-07-29 DIAGNOSIS — I5032 Chronic diastolic (congestive) heart failure: Secondary | ICD-10-CM

## 2023-07-29 DIAGNOSIS — Z952 Presence of prosthetic heart valve: Secondary | ICD-10-CM

## 2023-07-29 DIAGNOSIS — I342 Nonrheumatic mitral (valve) stenosis: Secondary | ICD-10-CM | POA: Diagnosis not present

## 2023-07-29 NOTE — Patient Instructions (Signed)
Medication Instructions:  Your physician recommends that you continue on your current medications as directed. Please refer to the Current Medication list given to you today.  *If you need a refill on your cardiac medications before your next appointment, please call your pharmacy*  Lab Work: None ordered today.  Testing/Procedures: None ordered today.  Follow-Up: At [facility], you and your health needs are our priority.  As part of our continuing mission to provide you with exceptional heart care, we have created designated Provider Care Teams.  These Care Teams include your primary Cardiologist (physician) and Advanced Practice Providers (APPs -  Physician Assistants and Nurse Practitioners) who all work together to provide you with the care you need, when you need it.  Your next appointment:   6 month(s)  The format for your next appointment:   In Person  Provider:   Jari Favre, PA-C, Ronie Spies, PA-C, Robin Searing, NP, Tereso Newcomer, PA-C, or Perlie Gold, PA-C

## 2023-11-10 NOTE — Progress Notes (Unsigned)
 [facility]   MULTIDISCIPLINARY HEART VALVE CLINIC                                     Cardiology Office Note:    Date:  11/11/2023   ID:  LAKENDRIA KARLBERG, DOB [birth year], MRN [id number]  PCP:  Perley Bradley, MD  [facility] Cardiologist:  Kyra Phy, MD  [facility] Structural heart: Kyra Phy, MD [facility] Electrophysiologist:  None   Referring MD: Perley Bradley, MD   1 year s/p TAVR  History of Present Illness:    Brandy [name] is a 89 y.o. 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   The patient is well-known to the structural heart clinic and was referred for TAVR in 05/2021. She underwent [facility] 05/2021 which showed no CAD.  She ultimately declined intervention at that time. In 02/2022, the patient developed acute cholecystitis with hepatic bile duct dilatation and was treated with antibiotics. Laparoscopic cholecystectomy was deferred at that time given severe AS.  She returned to the structural heart clinic and was more interested in proceeding with TAVR in order to have her GI surgery. Echo 02/08/22 showed EF 60%, mod LVH, and severe AS with a mean grad 31 mmHg, peak grad 53.9 mmHg, AVA 0.76, DVI 0.24, severe MAC with moderate MS (mean gradient 9 mmHg) and mild MR. She was then readmitted in 03/2022 with mechanical fall and sacral/pubic ramus fracture and then again in 07/2022 for a fall resulting in a neck fracture.  She was seen back in our clinic and plans were made for continued surveillance and recuperation from orthopedic injuries. She then presented to the ER on 10/24/22 for evaluation of chest pain. HStrop flat and ECG without acute ischemia. Chest pain was felt to be related  to severe AS and she was referred back to us . She was seen in the office by Dr. Lorie Rook on 10/28/22 and reported living at an independent living facility and doing generally well, albeit with a low functional capacity. Mostly wheelchair bound but able to walk short distances. Given worsening DOE with transfers and chest pain it was decided to proceed with TAVR. S/p TAVR with a 23 mm Edwards Sapien 3 Ultra Resilia THV via the TF approach on 11/17/22. Post operative echo showed EF 65%, normally functioning TAVR with a mean gradient of 11 mmHg and trivial PVL as well as severe MAC with moderate MS. Hg dropped to 5.4. She was treated with 1U PRBCs and Hg improved to 10.7. She has had no evidence of acute bleeding and likely related to blood loss during case and hemodilution. Also treated with IV lasix  for volume overload. She was started on a baby aspirin  at discharge.   Today the patient presents to clinic for follow up. No CP. Has some mild SOB with exertion, but mostly in a wheelchair. No LE edema, orthopnea or PND. No dizziness or syncope. No blood in stool or urine. No palpitations. She lives in a skill nursing facility. She fell about 4 weeks ago and was seen at [facility]. Bp was reportedly very high at that time. Daughter, Loyce Ruffini said basic labs were drawn  at facility last week but they dont know results yet   Past Medical History:  Diagnosis Date   Anemia    Arthritis    shoulders   Asthma    mild, no inhalers used   Breast cancer (HCC) 12/18/2011   left breast masectomy=metastatic ca in (1/1) lymph node ,invasive ductal ca,2 foci,,dcis,lymph ovascular invasion identified,surgical resection margins neg for ca,additional tissue=benign skin and subcutaneous tissue   Bronchitis    hx of;last time >22yr ago   Depression    takes Celexa  daily   Full dentures    Gall stones 2015   GERD (gastroesophageal reflux disease)    takes Prilosec prn   H/O hiatal hernia    History of kidney stones    many yrs  ago   Hx of radiation therapy 04/26/12 -06/10/12   left breast   Hyperlipidemia    takes Simvastatin  daily   Hypertension    takes Amlodipine  and Diovan  daily   Insomnia    takes Ambien  nightly   S/P TAVR (transcatheter aortic valve replacement) 11/17/2022   s/p TAVR with a 23 mm Edwards S3UR via the TF approach by Dr. Lorie Rook & Dr. Honey Lusty   Severe aortic stenosis    Urinary urgency      Current Medications: Current Meds  Medication Sig   acetaminophen  (TYLENOL ) 325 MG tablet Take 650 mg by mouth every 6 (six) hours as needed for moderate pain.   alendronate (FOSAMAX) 70 MG tablet Take 70 mg by mouth every Sunday.   aspirin  81 MG chewable tablet Chew 1 tablet (81 mg total) by mouth daily. Okay to swallow whole   atorvastatin  (LIPITOR) 40 MG tablet Take 40 mg by mouth at bedtime.   cyanocobalamin  (VITAMIN B12) 1000 MCG tablet Take 1,000 mcg by mouth See admin instructions. Take 1000 mcg in the evening every 3 days   DULoxetine  (CYMBALTA ) 30 MG capsule Take 30 mg by mouth daily.   ferrous sulfate  325 (65 FE) MG tablet Take 1 tablet (325 mg total) by mouth daily. (Patient taking differently: Take 325 mg by mouth 2 (two) times daily.)   gabapentin  (NEURONTIN ) 300 MG capsule Take 2 capsules (600 mg total) by mouth at bedtime.   Menthol , Topical Analgesic, (BIOFREEZE) 4 % GEL Apply 1 application  topically every 8 (eight) hours as needed (pain).   Multiple Vitamin (MULTIVITAMIN WITH MINERALS) TABS tablet Take 1 tablet by mouth in the morning.   omeprazole  (PRILOSEC) 40 MG capsule Take 1 capsule (40 mg total) by mouth daily before breakfast.   rOPINIRole  (REQUIP ) 0.25 MG tablet Take 0.25 mg by mouth at bedtime.   traMADol  (ULTRAM ) 50 MG tablet Take 1 tablet (50 mg total) by mouth 4 (four) times daily as needed for moderate pain (pain.). (Patient taking differently: Take 50 mg by mouth in the morning, at noon, and at bedtime.)   valsartan -hydrochlorothiazide  (DIOVAN -HCT) 80-12.5 MG tablet  Take 1 tablet by mouth in the morning.      ROS:   Please see the history of present illness.    All other systems reviewed and are negative.  EKGs   EKG Interpretation Date/Time:  Thursday Nov 11 2023 13:27:39 EDT Ventricular Rate:  71 PR Interval:  134 QRS Duration:  122 QT Interval:  394 QTC Calculation: 428 R Axis:   18  Text Interpretation: Normal sinus rhythm Non-specific intra-ventricular conduction delay Minimal voltage criteria for LVH, may be normal variant ( Cornell product ) Confirmed by Abagail Hoar [phone]) on 11/11/2023 1:45:37  PM   Risk Assessment/Calculations:           Physical Exam:    VS:  BP (!) 130/50 (BP Location: Left Arm, Patient Position: Sitting, Cuff Size: Normal)   Pulse 71   Ht 5\' 2"  (1.575 m)   Wt 132 lb 9.6 oz (60.1 kg)   SpO2 94%   BMI 24.25 kg/m     Wt Readings from Last 3 Encounters:  11/11/23 132 lb 9.6 oz (60.1 kg)  07/29/23 134 lb 6.4 oz (61 kg)  12/25/22 139 lb 3.2 oz (63.1 kg)     GEN: Well nourished, well developed in no acute distress NECK: No JVD CARDIAC: RRR, 2/6 SEM at RUSB. No, rubs, gallops RESPIRATORY:  Clear to auscultation without rales, wheezing or rhonchi  ABDOMEN: Soft, non-tender, non-distended EXTREMITIES:  No edema; No deformity.    ASSESSMENT:    1. S/P TAVR (transcatheter aortic valve replacement)   2. Chronic diastolic CHF (congestive heart failure) (HCC)   3. Nonrheumatic mitral valve stenosis   4. Stage 3a chronic kidney disease (HCC)   5. Essential hypertension     PLAN:    In order of problems listed above:  Severe AS s/p TAVR:  -- Echo today shows EF 65%, normally functioning TAVR with a mean gradient of 7 mm hg and mild PVL as well as severe MAC with mild MS.  -- NYHA class I symptoms, but not very active.  -- Continue Aspirin  81mg  daily. -- SBE prophylaxis discussed; the patient is edentulous and does not go to the dentist.  -- Continue regular follow up with Dr. Lorie Rook.     Chronic diastolic CHF:  -- Appears euvolemic.  -- Continue Diovan -HCT 80-12.5mg  daily.    Severe MAC with mitral stenosis:  -- Stable.     CKD stage IIIa:  -- Labs recently checked at [facility] but they don't have results yet.    HTN: -- BP well controlled.   -- Continue Diovan -HCT 80-12.5mg  daily.    Medication Adjustments/Labs and Tests Ordered: Current medicines are reviewed at length with the patient today.  Concerns regarding medicines are outlined above.  Orders Placed This Encounter  Procedures   EKG 12-Lead   ECHOCARDIOGRAM COMPLETE   No orders of the defined types were placed in this encounter.   Patient Instructions  Medication Instructions:  Your physician recommends that you continue on your current medications as directed. Please refer to the Current Medication list given to you today.  *If you need a refill on your cardiac medications before your next appointment, please call your pharmacy*  Lab Work: NONE If you have labs (blood work) drawn today and your tests are completely normal, you will receive your results only by: MyChart Message (if you have MyChart) OR A paper copy in the mail If you have any lab test that is abnormal or we need to change your treatment, we will call you to review the results.  Testing/Procedures: Your physician has requested that you have an echocardiogram. Echocardiography is a painless test that uses sound waves to create images of your heart. It provides your doctor with information about the size and shape of your heart and how well your heart's chambers and valves are working. This procedure takes approximately one hour. There are no restrictions for this procedure. TO BE DONE IN 1 YEAR Please do NOT wear cologne, perfume, aftershave, or lotions (deodorant is allowed). Please arrive 15 minutes prior to your appointment time.  Please note: We ask at  that you not bring children with you during ultrasound (echo/ vascular) testing. Due  to room size and safety concerns, children are not allowed in the ultrasound rooms during exams. Our front office staff cannot provide observation of children in our lobby area while testing is being conducted. An adult accompanying a patient to their appointment will only be allowed in the ultrasound room at the discretion of the ultrasound technician under special circumstances. We apologize for any inconvenience.   Follow-Up: At [facility], you and your health needs are our priority.  As part of our continuing mission to provide you with exceptional heart care, our providers are all part of one team.  This team includes your primary Cardiologist (physician) and Advanced Practice Providers or APPs (Physician Assistants and Nurse Practitioners) who all work together to provide you with the care you need, when you need it.  Your next appointment:   1 year(s)  Provider:   Alyssa Backbone, MD   We recommend signing up for the patient portal called "MyChart".  Sign up information is provided on this After Visit Summary.  MyChart is used to connect with patients for Virtual Visits (Telemedicine).  Patients are able to view lab/test results, encounter notes, upcoming appointments, etc.  Non-urgent messages can be sent to your provider as well.   To learn more about what you can do with MyChart, go to [email or web address].   Other Instructions        Signed, Abagail Hoar, PA-C  11/11/2023 4:23 PM    [facility]

## 2023-11-11 ENCOUNTER — Ambulatory Visit: Payer: Medicare (Managed Care) | Attending: Physician Assistant | Admitting: Physician Assistant

## 2023-11-11 ENCOUNTER — Encounter: Payer: Self-pay | Admitting: Physician Assistant

## 2023-11-11 ENCOUNTER — Ambulatory Visit (HOSPITAL_COMMUNITY): Payer: Medicare (Managed Care) | Attending: Cardiology

## 2023-11-11 VITALS — BP 130/50 | HR 71 | Ht 62.0 in | Wt 132.6 lb

## 2023-11-11 DIAGNOSIS — N1831 Chronic kidney disease, stage 3a: Secondary | ICD-10-CM | POA: Diagnosis not present

## 2023-11-11 DIAGNOSIS — I342 Nonrheumatic mitral (valve) stenosis: Secondary | ICD-10-CM

## 2023-11-11 DIAGNOSIS — Z952 Presence of prosthetic heart valve: Secondary | ICD-10-CM | POA: Diagnosis not present

## 2023-11-11 DIAGNOSIS — I5032 Chronic diastolic (congestive) heart failure: Secondary | ICD-10-CM

## 2023-11-11 DIAGNOSIS — I10 Essential (primary) hypertension: Secondary | ICD-10-CM

## 2023-11-11 DIAGNOSIS — I340 Nonrheumatic mitral (valve) insufficiency: Secondary | ICD-10-CM | POA: Diagnosis not present

## 2023-11-11 LAB — ECHOCARDIOGRAM COMPLETE
AV Mean grad: 7 mmHg
AV Peak grad: 12.3 mmHg
Ao pk vel: 1.75 m/s
Area-P 1/2: 2.22 cm2
P 1/2 time: 418 ms
S' Lateral: 3 cm

## 2023-11-11 NOTE — Patient Instructions (Addendum)
 Medication Instructions:  Your physician recommends that you continue on your current medications as directed. Please refer to the Current Medication list given to you today.  *If you need a refill on your cardiac medications before your next appointment, please call your pharmacy*  Lab Work: NONE If you have labs (blood work) drawn today and your tests are completely normal, you will receive your results only by: MyChart Message (if you have MyChart) OR A paper copy in the mail If you have any lab test that is abnormal or we need to change your treatment, we will call you to review the results.  Testing/Procedures: Your physician has requested that you have an echocardiogram. Echocardiography is a painless test that uses sound waves to create images of your heart. It provides your doctor with information about the size and shape of your heart and how well your heart's chambers and valves are working. This procedure takes approximately one hour. There are no restrictions for this procedure. TO BE DONE IN 1 YEAR Please do NOT wear cologne, perfume, aftershave, or lotions (deodorant is allowed). Please arrive 15 minutes prior to your appointment time.  Please note: We ask at that you not bring children with you during ultrasound (echo/ vascular) testing. Due to room size and safety concerns, children are not allowed in the ultrasound rooms during exams. Our front office staff cannot provide observation of children in our lobby area while testing is being conducted. An adult accompanying a patient to their appointment will only be allowed in the ultrasound room at the discretion of the ultrasound technician under special circumstances. We apologize for any inconvenience.   Follow-Up: At [facility], you and your health needs are our priority.  As part of our continuing mission to provide you with exceptional heart care, our providers are all part of one team.  This team includes your  primary Cardiologist (physician) and Advanced Practice Providers or APPs (Physician Assistants and Nurse Practitioners) who all work together to provide you with the care you need, when you need it.  Your next appointment:   1 year(s)  Provider:   Alyssa Backbone, MD   We recommend signing up for the patient portal called "MyChart".  Sign up information is provided on this After Visit Summary.  MyChart is used to connect with patients for Virtual Visits (Telemedicine).  Patients are able to view lab/test results, encounter notes, upcoming appointments, etc.  Non-urgent messages can be sent to your provider as well.   To learn more about what you can do with MyChart, go to [email or web address].   Other Instructions

## 2023-11-12 ENCOUNTER — Other Ambulatory Visit (HOSPITAL_COMMUNITY): Payer: Medicare (Managed Care)

## 2023-11-12 ENCOUNTER — Ambulatory Visit: Payer: Medicare (Managed Care)

## 2023-12-16 ENCOUNTER — Encounter: Payer: Self-pay | Admitting: Internal Medicine

## 2024-05-15 NOTE — ED Provider Notes (Signed)
 [facility]  ED Progress Note    Report was received from [place], [place] at change of shift.  Patient was pending lab work prior to disposition.  Lab work was all consistent with prior and primarily unremarkable.  We will prescribe Augmentin  twice daily for 10 days to help with pneumonia.  Recommend Tylenol  and/or ibuprofen as needed for pain relief and fever reduction as well as ice to help with pain from fall.  Strict return precautions were given.  Conservative symptom management discussed.  Electronically Signed by:   Ashley JONELLE Sharps, FNP 05/15/24 [phone]

## 2024-11-10 ENCOUNTER — Other Ambulatory Visit (HOSPITAL_COMMUNITY): Payer: Medicare (Managed Care)
